# Patient Record
Sex: Female | Born: 1945
Health system: Southern US, Community
[De-identification: ages and names within clinical notes are randomized; demographics above are authoritative.]

## PROBLEM LIST (undated history)

## (undated) DIAGNOSIS — F32A Depression, unspecified: Secondary | ICD-10-CM

## (undated) DIAGNOSIS — I499 Cardiac arrhythmia, unspecified: Secondary | ICD-10-CM

## (undated) DIAGNOSIS — M109 Gout, unspecified: Secondary | ICD-10-CM

## (undated) DIAGNOSIS — F329 Major depressive disorder, single episode, unspecified: Secondary | ICD-10-CM

## (undated) DIAGNOSIS — G479 Sleep disorder, unspecified: Secondary | ICD-10-CM

## (undated) DIAGNOSIS — M170 Bilateral primary osteoarthritis of knee: Secondary | ICD-10-CM

## (undated) DIAGNOSIS — R519 Headache, unspecified: Secondary | ICD-10-CM

## (undated) DIAGNOSIS — G20 Parkinson's disease: Secondary | ICD-10-CM

## (undated) DIAGNOSIS — E785 Hyperlipidemia, unspecified: Secondary | ICD-10-CM

## (undated) DIAGNOSIS — M51369 Other intervertebral disc degeneration, lumbar region without mention of lumbar back pain or lower extremity pain: Secondary | ICD-10-CM

## (undated) DIAGNOSIS — R0600 Dyspnea, unspecified: Secondary | ICD-10-CM

## (undated) DIAGNOSIS — I5032 Chronic diastolic (congestive) heart failure: Secondary | ICD-10-CM

## (undated) DIAGNOSIS — F419 Anxiety disorder, unspecified: Secondary | ICD-10-CM

## (undated) DIAGNOSIS — E669 Obesity, unspecified: Secondary | ICD-10-CM

## (undated) DIAGNOSIS — H6090 Unspecified otitis externa, unspecified ear: Secondary | ICD-10-CM

## (undated) DIAGNOSIS — R51 Headache: Secondary | ICD-10-CM

## (undated) DIAGNOSIS — N6019 Diffuse cystic mastopathy of unspecified breast: Secondary | ICD-10-CM

## (undated) DIAGNOSIS — I4891 Unspecified atrial fibrillation: Secondary | ICD-10-CM

## (undated) DIAGNOSIS — G4733 Obstructive sleep apnea (adult) (pediatric): Secondary | ICD-10-CM

## (undated) DIAGNOSIS — E7800 Pure hypercholesterolemia, unspecified: Secondary | ICD-10-CM

## (undated) DIAGNOSIS — N898 Other specified noninflammatory disorders of vagina: Secondary | ICD-10-CM

## (undated) DIAGNOSIS — C50419 Malignant neoplasm of upper-outer quadrant of unspecified female breast: Secondary | ICD-10-CM

## (undated) DIAGNOSIS — M5030 Other cervical disc degeneration, unspecified cervical region: Secondary | ICD-10-CM

## (undated) DIAGNOSIS — I10 Essential (primary) hypertension: Secondary | ICD-10-CM

## (undated) DIAGNOSIS — M5136 Other intervertebral disc degeneration, lumbar region: Secondary | ICD-10-CM

## (undated) DIAGNOSIS — K219 Gastro-esophageal reflux disease without esophagitis: Secondary | ICD-10-CM

## (undated) DIAGNOSIS — Z923 Personal history of irradiation: Secondary | ICD-10-CM

## (undated) DIAGNOSIS — G20A1 Parkinson's disease without dyskinesia, without mention of fluctuations: Secondary | ICD-10-CM

## (undated) DIAGNOSIS — Z9989 Dependence on other enabling machines and devices: Secondary | ICD-10-CM

## (undated) HISTORY — DX: Obesity, unspecified: E66.9

## (undated) HISTORY — PX: UPPER GI ENDOSCOPY: SHX6162

## (undated) HISTORY — DX: Dependence on other enabling machines and devices: Z99.89

## (undated) HISTORY — DX: Obstructive sleep apnea (adult) (pediatric): G47.33

## (undated) HISTORY — DX: Malignant neoplasm of upper-outer quadrant of unspecified female breast: C50.419

## (undated) HISTORY — DX: Essential (primary) hypertension: I10

## (undated) HISTORY — DX: Diffuse cystic mastopathy of unspecified breast: N60.19

## (undated) HISTORY — DX: Hyperlipidemia, unspecified: E78.5

## (undated) HISTORY — DX: Chronic diastolic (congestive) heart failure: I50.32

## (undated) HISTORY — DX: Parkinson's disease without dyskinesia, without mention of fluctuations: G20.A1

## (undated) HISTORY — DX: Other cervical disc degeneration, unspecified cervical region: M50.30

## (undated) HISTORY — DX: Parkinson's disease: G20

## (undated) HISTORY — DX: Unspecified otitis externa, unspecified ear: H60.90

## (undated) HISTORY — DX: Other intervertebral disc degeneration, lumbar region without mention of lumbar back pain or lower extremity pain: M51.369

## (undated) HISTORY — DX: Bilateral primary osteoarthritis of knee: M17.0

## (undated) HISTORY — DX: Gout, unspecified: M10.9

## (undated) HISTORY — DX: Other intervertebral disc degeneration, lumbar region: M51.36

## (undated) HISTORY — DX: Other specified noninflammatory disorders of vagina: N89.8

---

## 1977-08-15 HISTORY — PX: TUBAL LIGATION: SHX77

## 1990-08-15 HISTORY — PX: ABDOMINAL HYSTERECTOMY: SHX81

## 1992-08-15 HISTORY — PX: CHOLECYSTECTOMY: SHX55

## 1993-08-15 HISTORY — PX: ERCP: SHX60

## 1998-07-31 ENCOUNTER — Other Ambulatory Visit: Admission: RE | Admit: 1998-07-31 | Discharge: 1998-07-31 | Payer: Self-pay | Admitting: Obstetrics and Gynecology

## 2005-07-01 ENCOUNTER — Ambulatory Visit: Payer: Self-pay | Admitting: Family Medicine

## 2006-06-01 ENCOUNTER — Ambulatory Visit: Payer: Self-pay | Admitting: Family Medicine

## 2007-06-18 ENCOUNTER — Ambulatory Visit: Payer: Self-pay | Admitting: General Surgery

## 2008-05-14 ENCOUNTER — Emergency Department: Payer: Self-pay | Admitting: Emergency Medicine

## 2008-06-20 ENCOUNTER — Ambulatory Visit: Payer: Self-pay | Admitting: General Surgery

## 2009-04-08 ENCOUNTER — Ambulatory Visit: Payer: Self-pay | Admitting: Gastroenterology

## 2009-07-06 ENCOUNTER — Ambulatory Visit: Payer: Self-pay | Admitting: Family Medicine

## 2010-07-07 ENCOUNTER — Ambulatory Visit: Payer: Self-pay | Admitting: Family Medicine

## 2010-07-15 ENCOUNTER — Ambulatory Visit: Payer: Self-pay | Admitting: Gastroenterology

## 2010-07-19 LAB — PATHOLOGY REPORT

## 2011-07-26 ENCOUNTER — Ambulatory Visit: Payer: Self-pay | Admitting: Family Medicine

## 2012-08-15 DIAGNOSIS — N6019 Diffuse cystic mastopathy of unspecified breast: Secondary | ICD-10-CM

## 2012-08-15 HISTORY — DX: Diffuse cystic mastopathy of unspecified breast: N60.19

## 2012-09-06 ENCOUNTER — Ambulatory Visit: Payer: Self-pay | Admitting: Family Medicine

## 2012-09-17 ENCOUNTER — Ambulatory Visit: Payer: Self-pay | Admitting: Family Medicine

## 2012-10-11 ENCOUNTER — Ambulatory Visit: Payer: Self-pay | Admitting: General Surgery

## 2012-10-11 DIAGNOSIS — I10 Essential (primary) hypertension: Secondary | ICD-10-CM

## 2012-10-13 DIAGNOSIS — C50419 Malignant neoplasm of upper-outer quadrant of unspecified female breast: Secondary | ICD-10-CM

## 2012-10-13 HISTORY — PX: BREAST SURGERY: SHX581

## 2012-10-13 HISTORY — DX: Malignant neoplasm of upper-outer quadrant of unspecified female breast: C50.419

## 2012-10-18 ENCOUNTER — Ambulatory Visit: Payer: Self-pay | Admitting: General Surgery

## 2012-10-30 ENCOUNTER — Telehealth: Payer: Self-pay | Admitting: *Deleted

## 2012-10-30 NOTE — Telephone Encounter (Signed)
Patient called the office today to let us know that she has contacted the [facility] and has made an appointment for evaluation. This patient has an appointment to see Dr. Carmina Miller on 11-12-12 at 8:30 am.

## 2012-11-01 LAB — PATHOLOGY REPORT

## 2012-11-02 ENCOUNTER — Encounter: Payer: Self-pay | Admitting: *Deleted

## 2012-11-05 ENCOUNTER — Encounter: Payer: Self-pay | Admitting: General Surgery

## 2012-11-05 ENCOUNTER — Ambulatory Visit (INDEPENDENT_AMBULATORY_CARE_PROVIDER_SITE_OTHER): Payer: BC Managed Care – PPO | Admitting: General Surgery

## 2012-11-05 VITALS — BP 144/70 | HR 82 | Resp 16 | Ht 64.0 in | Wt 266.0 lb

## 2012-11-05 DIAGNOSIS — D0510 Intraductal carcinoma in situ of unspecified breast: Secondary | ICD-10-CM

## 2012-11-05 DIAGNOSIS — I10 Essential (primary) hypertension: Secondary | ICD-10-CM | POA: Insufficient documentation

## 2012-11-05 DIAGNOSIS — Z86000 Personal history of in-situ neoplasm of breast: Secondary | ICD-10-CM | POA: Insufficient documentation

## 2012-11-05 DIAGNOSIS — K219 Gastro-esophageal reflux disease without esophagitis: Secondary | ICD-10-CM

## 2012-11-05 DIAGNOSIS — D0590 Unspecified type of carcinoma in situ of unspecified breast: Secondary | ICD-10-CM

## 2012-11-05 NOTE — Progress Notes (Signed)
This is a 67 year old patient following up from a right mastectomy done on 10/18/12. The patient is experiencing minimal discomfort.  Examination of the right breast wide excision site shows good healing without erythema or induration. Evidence of resolving ecchymosis is noted in the lower medial breast. The right axillary sentinel node site is healing well.  Ultrasound examination of the right breast shows a cavity lobe the wide excision site measuring 2.0 x 4.0 x 7.0 cm. This is located 1.5 cm below the level of the skin.  The pathology shows an in situ cancer. There will be no indication for adjuvant chemotherapy. The patient will be a candidate for antiestrogen therapy after completing radiation.  The patient is a candidate for either partial breast for breast radiation based on the anatomy post-reconstruction. This will be determined by the radiation oncologist.

## 2012-11-12 ENCOUNTER — Telehealth: Payer: Self-pay | Admitting: *Deleted

## 2012-11-12 ENCOUNTER — Ambulatory Visit: Payer: Self-pay | Admitting: Radiation Oncology

## 2012-11-12 NOTE — Telephone Encounter (Signed)
Per Toniann Fail at Dr. Kipp Laurence office, patient was seen today and is a candidate for mammosite placement. Please complete scheduling sheet on your desk so Mindi Junker can arrange. Thanks.

## 2012-11-13 ENCOUNTER — Other Ambulatory Visit: Payer: Self-pay | Admitting: *Deleted

## 2012-11-13 ENCOUNTER — Ambulatory Visit: Payer: Self-pay | Admitting: Radiation Oncology

## 2012-11-13 DIAGNOSIS — N63 Unspecified lump in unspecified breast: Secondary | ICD-10-CM

## 2012-11-13 MED ORDER — DOXYCYCLINE HYCLATE 100 MG PO TABS: 100.0000 mg | ORAL_TABLET | Freq: Two times a day (BID) | ORAL | Status: DC

## 2012-11-13 NOTE — Telephone Encounter (Signed)
Reviewed with pt mammosite details, aware of ATB

## 2012-11-13 NOTE — Telephone Encounter (Signed)
Inform pt of mammosite dates.  Place mammosite April 23, Scan 24 and treat April 25, 28-30 and May 1

## 2012-12-03 ENCOUNTER — Encounter: Payer: Self-pay | Admitting: *Deleted

## 2012-12-05 ENCOUNTER — Ambulatory Visit (INDEPENDENT_AMBULATORY_CARE_PROVIDER_SITE_OTHER): Payer: BC Managed Care – PPO | Admitting: General Surgery

## 2012-12-05 ENCOUNTER — Encounter: Payer: Self-pay | Admitting: General Surgery

## 2012-12-05 VITALS — HR 76 | Resp 16 | Ht 60.0 in | Wt 261.0 lb

## 2012-12-05 DIAGNOSIS — D0591 Unspecified type of carcinoma in situ of right breast: Secondary | ICD-10-CM

## 2012-12-05 DIAGNOSIS — D0590 Unspecified type of carcinoma in situ of unspecified breast: Secondary | ICD-10-CM

## 2012-12-05 NOTE — Progress Notes (Signed)
Patient ID: Selena Bush, female   DOB: 07/27/1946, 67 y.o.   MRN: [id number]  Chief Complaint  Patient presents with  . Other    mammosite    HPI Selena Bush is a 67 y.o. female.here today for her mammosite placement.HPI  Past Medical History  Diagnosis Date  . Hypertension   . Diffuse cystic mastopathy 2014  . Arthritis    a Past Surgical History  Procedure Laterality Date  . Colonoscopy  2011     Dr. Bluford Kaufmann  . Abdominal hysterectomy  1996  . Cholecystectomy  1998  . Ercp  1995  . Mastectomy Right 2014  . Breast surgery Right 2014    Family History  Problem Relation Age of Onset  . Colon cancer Maternal Grandfather   . Breast cancer Maternal Grandfather   . Colon cancer Mother     Social History History  Substance Use Topics  . Smoking status: Former Smoker -- 5.00 packs/day for 5 years  . Smokeless tobacco: Never Used  . Alcohol Use: No    Allergies  Allergen Reactions  . Codeine     Gi problems   . Penicillins     anaphylaxis    . Sulfa Antibiotics     anaphylaxis     Current Outpatient Prescriptions  Medication Sig Dispense Refill  . furosemide (LASIX) 40 MG tablet Take 40 mg by mouth daily.      [name] PENNSAID 1.5 % SOLN Take 150 mg by mouth 2 (two) times daily.      . potassium chloride (MICRO-K) 10 MEQ CR capsule Take 30 mEq by mouth 2 (two) times daily.      [name] amLODipine (NORVASC) 2.5 MG tablet       . atenolol (TENORMIN) 50 MG tablet Take 50 mg by mouth daily.      . busPIRone (BUSPAR) 15 MG tablet Take 15 mg by mouth as needed.      . doxycycline (VIBRA-TABS) 100 MG tablet Take 1 tablet (100 mg total) by mouth 2 (two) times daily.  20 tablet  0  . DULoxetine (CYMBALTA) 60 MG capsule Take 60 mg by mouth daily.      [name] lisinopril (PRINIVIL,ZESTRIL) 20 MG tablet Take 20 mg by mouth daily.      Alfonso Patten 2 MG TABS Take 2 mg by mouth at bedtime.      [name] NEXIUM 40 MG capsule Take 40 mg by mouth daily.      . traMADol (ULTRAM) 50 MG tablet Take 50 mg by  mouth as needed.      . triamterene-hydrochlorothiazide (MAXZIDE) 75-50 MG per tablet Take by mouth daily.       No current facility-administered medications for this visit.    Review of Systems Review of Systems  Constitutional: Negative.   Respiratory: Negative.   Cardiovascular: Negative.     Pulse 76, resp. rate 16, height 5' (1.524 m), weight 261 lb (118.389 kg).  Physical Exam Physical Exam Examination of the breast showed no contraindication of MammoSite balloon placement. Data Reviewed Ultrasound examination shows an adequate cavity depth 1.25 cm below the skin.    Assessment    DCIS of the right breast, candidate for partial breast radiation.     Plan    The patient had made use of Duricef this morning is requested. The area was prepped with alcohol and 10 cc of 0.5% Xylocaine with 0.25% Marcaine with one 200,000 units epinephrine was used for local anesthesia well tolerated. A 4-5 cm  spherical blue was then placed under ultrasound guidance and inflated with 35 cc of normal saline with Omnipaque. Except for transient discomfort which passed by the end of the procedure she was asymptomatic. Antibiotic ointment was placed at the balloon exit site followed by dry dressing.  The patient will have a CT scan at the cancer Center tomorrow followed by the initiation of partial breast radiation on April 25 under the care of Dr. Rushie Chestnut. Arrangements were made for follow up in 2-3 weeks for physician exam.       Earline Mayotte 12/05/2012, 8:07 PM

## 2012-12-05 NOTE — Patient Instructions (Addendum)
Patient care kit given for gauze and tape if needed. Follow up at [facility] as scheduled.

## 2012-12-07 ENCOUNTER — Encounter: Payer: Self-pay | Admitting: General Surgery

## 2012-12-07 DIAGNOSIS — D0591 Unspecified type of carcinoma in situ of right breast: Secondary | ICD-10-CM

## 2012-12-07 NOTE — Progress Notes (Signed)
Patient ID: Selena Bush, female   DOB: 11/03/1945, 67 y.o.   MRN: [id number]  Patient requested correction to medical record which has been completed.

## 2012-12-13 ENCOUNTER — Ambulatory Visit: Payer: Self-pay | Admitting: Radiation Oncology

## 2012-12-17 ENCOUNTER — Encounter: Payer: Self-pay | Admitting: General Surgery

## 2013-01-02 ENCOUNTER — Ambulatory Visit: Payer: BC Managed Care – PPO | Admitting: General Surgery

## 2013-01-08 ENCOUNTER — Ambulatory Visit: Payer: Self-pay | Admitting: Family Medicine

## 2013-01-10 ENCOUNTER — Ambulatory Visit: Payer: BC Managed Care – PPO | Admitting: General Surgery

## 2013-01-13 ENCOUNTER — Ambulatory Visit: Payer: Self-pay | Admitting: Radiation Oncology

## 2013-01-28 ENCOUNTER — Ambulatory Visit (INDEPENDENT_AMBULATORY_CARE_PROVIDER_SITE_OTHER): Payer: BC Managed Care – PPO | Admitting: General Surgery

## 2013-01-28 ENCOUNTER — Encounter: Payer: Self-pay | Admitting: General Surgery

## 2013-01-28 VITALS — BP 118/90 | HR 76 | Resp 16 | Wt 251.0 lb

## 2013-01-28 DIAGNOSIS — D0590 Unspecified type of carcinoma in situ of unspecified breast: Secondary | ICD-10-CM

## 2013-01-28 DIAGNOSIS — D0591 Unspecified type of carcinoma in situ of right breast: Secondary | ICD-10-CM

## 2013-01-28 MED ORDER — TAMOXIFEN CITRATE 20 MG PO TABS: 20.0000 mg | ORAL_TABLET | Freq: Every day | ORAL | Status: DC

## 2013-01-28 NOTE — Patient Instructions (Addendum)
Patient to take a baby aspirin daily. Marland Kitchenasa

## 2013-01-28 NOTE — Progress Notes (Signed)
Patient ID: Selena Bush, female   DOB: [birth year], 67 y.o.   MRN: [id number]  Chief Complaint  Patient presents with  . Other    mammosite    HPI Selena Bush is a 67 y.o. female here today following up from her mamaosite placement done 12/05/12. HPI  Past Medical History  Diagnosis Date  . Hypertension   . Diffuse cystic mastopathy 2014  . Arthritis     Past Surgical History  Procedure Laterality Date  . Colonoscopy  2011     Dr. Bluford Kaufmann  . Abdominal hysterectomy  1996  . Cholecystectomy  1998  . Ercp  1995  . Mastectomy Right 2014  . Breast surgery Right 2014    Family History  Problem Relation Age of Onset  . Colon cancer Maternal Grandfather   . Breast cancer Maternal Grandfather   . Colon cancer Mother     Social History History  Substance Use Topics  . Smoking status: Former Smoker -- 5.00 packs/day for 5 years  . Smokeless tobacco: Never Used  . Alcohol Use: No    Allergies  Allergen Reactions  . Codeine     Gi problems   . Penicillins     anaphylaxis    . Sulfa Antibiotics     anaphylaxis     Current Outpatient Prescriptions  Medication Sig Dispense Refill  . amLODipine (NORVASC) 2.5 MG tablet       . atenolol (TENORMIN) 50 MG tablet Take 50 mg by mouth daily.      . busPIRone (BUSPAR) 15 MG tablet Take 15 mg by mouth as needed.      . Diclofenac Sodium POWD       . doxycycline (VIBRA-TABS) 100 MG tablet Take 1 tablet (100 mg total) by mouth 2 (two) times daily.  20 tablet  0  . DULoxetine (CYMBALTA) 60 MG capsule Take 60 mg by mouth daily.      . fluticasone (FLONASE) 50 MCG/ACT nasal spray       . furosemide (LASIX) 40 MG tablet Take 40 mg by mouth daily.      [name] HYDROcodone-acetaminophen (NORCO/VICODIN) 5-325 MG per tablet       . lisinopril (PRINIVIL,ZESTRIL) 20 MG tablet Take 20 mg by mouth daily.      Alfonso Patten 2 MG TABS Take 2 mg by mouth at bedtime.      . meloxicam (MOBIC) 7.5 MG tablet       . NEXIUM 40 MG capsule Take 40 mg by mouth  daily.      [name] PENNSAID 1.5 % SOLN Take 150 mg by mouth 2 (two) times daily.      . potassium chloride (MICRO-K) 10 MEQ CR capsule Take 30 mEq by mouth 2 (two) times daily.      . propranolol ER (INDERAL LA) 120 MG 24 hr capsule       . tiZANidine (ZANAFLEX) 4 MG tablet       . traMADol (ULTRAM) 50 MG tablet Take 50 mg by mouth as needed.      . triamterene-hydrochlorothiazide (MAXZIDE) 75-50 MG per tablet Take by mouth daily.      . tamoxifen (NOLVADEX) 20 MG tablet Take 1 tablet (20 mg total) by mouth daily.  30 tablet  11   No current facility-administered medications for this visit.    Review of Systems Review of Systems  Constitutional: Negative.   Respiratory: Negative.   Cardiovascular: Negative.     Blood pressure 118/90, pulse 76, resp. rate  16, weight 251 lb (113.853 kg).  Physical Exam Physical Exam Examination of the right breast status post MammoSite balloon removal shows good healing of the exit site. No evidence of loculated fluid. No erythema or induration. Data Reviewed No new data.  Assessment    DCIS right breast.     Plan    The patient is a candidate for antiestrogen therapy based on her ER-positive status. Tamoxifen may not be as effective because of her use of Cymbalta.  Her primary physician will be contacted regarding a possible change to a less potent cytochrome P 450 stimulator. In the interval, we'll place her on tamoxifen 20 mg daily. She reported her progress in one month. Followup exam and right mammogram in 6 months.        Earline Mayotte 01/29/2013, 8:18 PM

## 2013-01-29 ENCOUNTER — Encounter: Payer: Self-pay | Admitting: General Surgery

## 2013-02-21 ENCOUNTER — Telehealth: Payer: Self-pay

## 2013-02-21 NOTE — Telephone Encounter (Signed)
This encounter was created in error - please disregard.

## 2013-02-21 NOTE — Telephone Encounter (Signed)
Patient called to state that she is doing very well after being on Tamoxifen for the past 30 days. She states that she is unable to take the half tab of entericoted 81 mg aspirin due to stomach burning. She has had no side effects from theTamoxifen and just wanted [place] to know.

## 2013-04-05 ENCOUNTER — Encounter (HOSPITAL_COMMUNITY): Payer: Self-pay | Admitting: Pharmacy Technician

## 2013-04-08 ENCOUNTER — Ambulatory Visit (HOSPITAL_COMMUNITY)
Admission: RE | Admit: 2013-04-08 | Discharge: 2013-04-08 | Disposition: A | Discharge status: Home / Self Care | Payer: BC Managed Care – PPO | Source: Ambulatory Visit | Attending: Orthopedic Surgery | Admitting: Orthopedic Surgery

## 2013-04-08 ENCOUNTER — Encounter (HOSPITAL_COMMUNITY)
Admission: RE | Admit: 2013-04-08 | Discharge: 2013-04-08 | Disposition: A | Discharge status: Home / Self Care | Payer: BC Managed Care – PPO | Source: Ambulatory Visit | Attending: Orthopedic Surgery | Admitting: Orthopedic Surgery

## 2013-04-08 ENCOUNTER — Encounter (HOSPITAL_COMMUNITY): Payer: Self-pay

## 2013-04-08 DIAGNOSIS — Z01818 Encounter for other preprocedural examination: Secondary | ICD-10-CM | POA: Insufficient documentation

## 2013-04-08 DIAGNOSIS — M1710 Unilateral primary osteoarthritis, unspecified knee: Secondary | ICD-10-CM | POA: Insufficient documentation

## 2013-04-08 DIAGNOSIS — Z01812 Encounter for preprocedural laboratory examination: Secondary | ICD-10-CM | POA: Insufficient documentation

## 2013-04-08 HISTORY — DX: Pure hypercholesterolemia, unspecified: E78.00

## 2013-04-08 HISTORY — DX: Sleep disorder, unspecified: G47.9

## 2013-04-08 HISTORY — DX: Anxiety disorder, unspecified: F41.9

## 2013-04-08 HISTORY — DX: Depression, unspecified: F32.A

## 2013-04-08 HISTORY — DX: Gastro-esophageal reflux disease without esophagitis: K21.9

## 2013-04-08 HISTORY — DX: Major depressive disorder, single episode, unspecified: F32.9

## 2013-04-08 LAB — CBC
HCT: 36.4 % (ref 36.0–46.0)
Hemoglobin: 11.4 g/dL — ABNORMAL LOW (ref 12.0–15.0)
MCH: 24.9 pg — ABNORMAL LOW (ref 26.0–34.0)
MCV: 79.5 fL (ref 78.0–100.0)
RBC: 4.58 MIL/uL (ref 3.87–5.11)
WBC: 7.8 10*3/uL (ref 4.0–10.5)

## 2013-04-08 LAB — URINALYSIS, ROUTINE W REFLEX MICROSCOPIC
Hgb urine dipstick: NEGATIVE
Leukocytes, UA: NEGATIVE
Nitrite: NEGATIVE
Protein, ur: NEGATIVE mg/dL
Urobilinogen, UA: 0.2 mg/dL (ref 0.0–1.0)

## 2013-04-08 LAB — ABO/RH: ABO/RH(D): AB POS

## 2013-04-08 LAB — BASIC METABOLIC PANEL
CO2: 29 mEq/L (ref 19–32)
Chloride: 98 mEq/L (ref 96–112)
Glucose, Bld: 101 mg/dL — ABNORMAL HIGH (ref 70–99)
Sodium: 136 mEq/L (ref 135–145)

## 2013-04-08 LAB — APTT: aPTT: 29 seconds (ref 24–37)

## 2013-04-08 NOTE — Pre-Procedure Instructions (Signed)
EKG AND OFFICE NOTE AND MEDICAL CLEARANCE FOR KNEE SURGERY ON CHART FROM DR. MALONEY- [facility]. CXR WAS DONE TODAY PREOP - AT [facility].

## 2013-04-08 NOTE — Patient Instructions (Signed)
YOUR SURGERY IS SCHEDULED AT [facility]  [id number]  9/2  REPORT TO [facility] AT:  9:10 AM      PHONE # FOR SHORT STAY IS [phone]  DO NOT EAT OR DRINK ANYTHING AFTER MIDNIGHT THE NIGHT BEFORE YOUR SURGERY.  YOU MAY BRUSH YOUR TEETH, RINSE OUT YOUR MOUTH--BUT NO WATER, NO FOOD, NO CHEWING GUM, NO MINTS, NO CANDIES, NO CHEWING TOBACCO.  PLEASE TAKE THE FOLLOWING MEDICATIONS THE AM OF YOUR SURGERY WITH A FEW SIPS OF WATER:  ATENOLOL, DULOXETINE, NEXIUM    IF YOU HAVE SLEEP APNEA AND USE CPAP OR BIPAP--PLEASE BRING THE MASK AND THE TUBING.  DO NOT BRING YOUR MACHINE.  DO NOT BRING VALUABLES, MONEY, CREDIT CARDS.  DO NOT WEAR JEWELRY, MAKE-UP, NAIL POLISH AND NO METAL PINS OR CLIPS IN YOUR HAIR. CONTACT LENS, DENTURES / PARTIALS, GLASSES SHOULD NOT BE WORN TO SURGERY AND IN MOST CASES-HEARING AIDS WILL NEED TO BE REMOVED.  BRING YOUR GLASSES CASE, ANY EQUIPMENT NEEDED FOR YOUR CONTACT LENS. FOR PATIENTS ADMITTED TO THE HOSPITAL--CHECK OUT TIME THE DAY OF DISCHARGE IS 11:00 AM.  ALL INPATIENT ROOMS ARE PRIVATE - WITH BATHROOM, TELEPHONE, TELEVISION AND WIFI INTERNET.                               PLEASE READ OVER ANY  FACT SHEETS THAT YOU WERE GIVEN: MRSA INFORMATION, BLOOD TRANSFUSION INFORMATION, INCENTIVE SPIROMETER INFORMATION. FAILURE TO FOLLOW THESE INSTRUCTIONS MAY RESULT IN THE CANCELLATION OF YOUR SURGERY.   PATIENT SIGNATURE_________________________________

## 2013-04-10 NOTE — H&P (Signed)
TOTAL KNEE ADMISSION H&P  Patient is being admitted for right total knee arthroplasty.  Subjective:  Chief Complaint:   Right knee OA / pain.  HPI: Selena Bush, 67 y.o. female, has a history of pain and functional disability in the right knee due to arthritis and has failed non-surgical conservative treatments for greater than 12 weeks to includecorticosteriod injections, viscosupplementation injections, use of assistive devices and activity modification.  Onset of symptoms was gradual, starting 5 years ago with rapidlly worsening course since that time. The patient noted no past surgery on the right knee(s).  Patient currently rates pain in the right knee(s) at 8 out of 10 with activity. Patient has night pain, worsening of pain with activity and weight bearing, pain that interferes with activities of daily living, pain with passive range of motion, crepitus and joint swelling.  Patient has evidence of periarticular osteophytes and joint space narrowing by imaging studies.  There is no active signs of infection.  Risks, benefits and expectations were discussed with the patient. Patient understand the risks, benefits and expectations and wishes to proceed with surgery.   D/C Plans:   Home with HHPT  Post-op Meds:    No Rx given  Tranexamic Acid:   To be given  Decadron:    To be given  FYI:    Xarelto post-op  Norco post-op  CPAP will need to be ordered   Patient Active Problem List   Diagnosis Date Noted  . DCIS (ductal carcinoma in situ) 11/05/2012  . Essential hypertension, benign 11/05/2012   Past Medical History  Diagnosis Date  . Hypertension   . Diffuse cystic mastopathy 2014  . Anxiety   . Depression   . Pain     LUMBAR PAIN -DDD  . GERD (gastroesophageal reflux disease)   . Mitral insufficiency   . Arthritis     OA BOTH KNEES  . Hypercholesterolemia   . Sleep difficulties     LUNESTA HAS HELPED  . Cancer 10/2012    PARTIAL RIGHT MASTECTOMY FOR BREAST CANCER--HAD  RADIATION - NO CHEMO --DR. CRYSTAL [place] ONCOLOGIST  . Sleep apnea     USES CPAP    Past Surgical History  Procedure Laterality Date  . Colonoscopy  2011     Dr. Bluford Kaufmann  . Ercp  1995  . Breast surgery Right 2014  . Mastectomy Right 10/19/2012    PARTIAL MASTECTOMY  . Cholecystectomy  1994  . Tubal ligation  1979  . Abdominal hysterectomy  1992    No prescriptions prior to admission   Allergies  Allergen Reactions  . Codeine     Gi problems   . Penicillins     anaphylaxis    . Statins Other (See Comments)    Leg pains  . Sulfa Antibiotics     anaphylaxis     History  Substance Use Topics  . Smoking status: Former Smoker -- 5.00 packs/day for 5 years    Types: Cigarettes  . Smokeless tobacco: Never Used  . Alcohol Use: No     Comment: ONLY SMOKED AS A TEENAGER    Family History  Problem Relation Age of Onset  . Colon cancer Maternal Grandfather   . Breast cancer Maternal Grandfather   . Colon cancer Mother      Review of Systems  Constitutional: Negative.   HENT: Positive for tinnitus.   Eyes: Negative.   Respiratory: Negative.   Cardiovascular: Negative.   Gastrointestinal: Positive for diarrhea.  Genitourinary: Positive for frequency.  Musculoskeletal: Positive for myalgias, back pain and joint pain.  Skin: Negative.   Neurological: Negative.   Endo/Heme/Allergies: Positive for environmental allergies.  Psychiatric/Behavioral: Negative.     Objective:  Physical Exam  Constitutional: She is oriented to person, place, and time. She appears well-developed and well-nourished.  HENT:  Head: Normocephalic and atraumatic.  Mouth/Throat: Oropharynx is clear and moist.  Eyes: Pupils are equal, round, and reactive to light.  Neck: Neck supple. No JVD present. No tracheal deviation present. No thyromegaly present.  Cardiovascular: Normal rate, regular rhythm, normal heart sounds and intact distal pulses.   Respiratory: Effort normal and breath sounds normal.  No stridor. No respiratory distress. She has no wheezes.  GI: Soft. There is no tenderness. There is no guarding.  Musculoskeletal:       Right knee: She exhibits decreased range of motion, swelling and bony tenderness. She exhibits no effusion, no ecchymosis, no deformity, no laceration and no erythema. Tenderness found.  Lymphadenopathy:    She has no cervical adenopathy.  Neurological: She is alert and oriented to person, place, and time.  Skin: Skin is warm and dry.  Psychiatric: She has a normal mood and affect.    Labs:  Estimated body mass index is 49.02 kg/(m^2) as calculated from the following:   Height as of 12/05/12: 5' (1.524 m).   Weight as of 01/28/13: 113.853 kg (251 lb).  Imaging Review Plain radiographs demonstrate severe degenerative joint disease of the right knee(s). The overall alignment is neutral. The bone quality appears to be good for age and reported activity level.  Assessment/Plan:  End stage arthritis, right knee   The patient history, physical examination, clinical judgment of the provider and imaging studies are consistent with end stage degenerative joint disease of the right knee(s) and total knee arthroplasty is deemed medically necessary. The treatment options including medical management, injection therapy arthroscopy and arthroplasty were discussed at length. The risks and benefits of total knee arthroplasty were presented and reviewed. The risks due to aseptic loosening, infection, stiffness, patella tracking problems, thromboembolic complications and other imponderables were discussed. The patient acknowledged the explanation, agreed to proceed with the plan and consent was signed. Patient is being admitted for inpatient treatment for surgery, pain control, PT, OT, prophylactic antibiotics, VTE prophylaxis, progressive ambulation and ADL's and discharge planning. The patient is planning to be discharged home with home health services.   Anastasio Auerbach  Indiyah Paone   PAC  04/10/2013, 5:26 PM

## 2013-04-15 HISTORY — PX: JOINT REPLACEMENT: SHX530

## 2013-04-16 ENCOUNTER — Encounter (HOSPITAL_COMMUNITY): Payer: Self-pay | Admitting: *Deleted

## 2013-04-16 ENCOUNTER — Inpatient Hospital Stay (HOSPITAL_COMMUNITY): Payer: BC Managed Care – PPO | Admitting: Anesthesiology

## 2013-04-16 ENCOUNTER — Inpatient Hospital Stay (HOSPITAL_COMMUNITY)
Admission: RE | Admit: 2013-04-16 | Discharge: 2013-04-19 | DRG: 209 | Disposition: A | Discharge status: Home Health | Payer: BC Managed Care – PPO | Source: Ambulatory Visit | Attending: Orthopedic Surgery | Admitting: Orthopedic Surgery

## 2013-04-16 ENCOUNTER — Encounter (HOSPITAL_COMMUNITY)
Admission: RE | Disposition: A | Discharge status: Home Health | Payer: Self-pay | Source: Ambulatory Visit | Attending: Orthopedic Surgery

## 2013-04-16 ENCOUNTER — Encounter (HOSPITAL_COMMUNITY): Payer: Self-pay | Admitting: Anesthesiology

## 2013-04-16 DIAGNOSIS — D62 Acute posthemorrhagic anemia: Secondary | ICD-10-CM | POA: Diagnosis not present

## 2013-04-16 DIAGNOSIS — E662 Morbid (severe) obesity with alveolar hypoventilation: Secondary | ICD-10-CM | POA: Diagnosis present

## 2013-04-16 DIAGNOSIS — Z96659 Presence of unspecified artificial knee joint: Secondary | ICD-10-CM

## 2013-04-16 DIAGNOSIS — D500 Iron deficiency anemia secondary to blood loss (chronic): Secondary | ICD-10-CM | POA: Diagnosis not present

## 2013-04-16 DIAGNOSIS — Z6841 Body Mass Index (BMI) 40.0 and over, adult: Secondary | ICD-10-CM

## 2013-04-16 DIAGNOSIS — Z96651 Presence of right artificial knee joint: Secondary | ICD-10-CM

## 2013-04-16 DIAGNOSIS — E7800 Pure hypercholesterolemia, unspecified: Secondary | ICD-10-CM | POA: Diagnosis present

## 2013-04-16 DIAGNOSIS — M1710 Unilateral primary osteoarthritis, unspecified knee: Principal | ICD-10-CM | POA: Diagnosis present

## 2013-04-16 DIAGNOSIS — F411 Generalized anxiety disorder: Secondary | ICD-10-CM | POA: Diagnosis present

## 2013-04-16 DIAGNOSIS — E871 Hypo-osmolality and hyponatremia: Secondary | ICD-10-CM | POA: Diagnosis not present

## 2013-04-16 HISTORY — PX: TOTAL KNEE ARTHROPLASTY: SHX125

## 2013-04-16 LAB — TYPE AND SCREEN
ABO/RH(D): AB POS
Antibody Screen: NEGATIVE

## 2013-04-16 SURGERY — ARTHROPLASTY, KNEE, TOTAL
Anesthesia: Spinal | Site: Knee | Laterality: Right | Wound class: Clean

## 2013-04-16 MED ORDER — KETOROLAC TROMETHAMINE 30 MG/ML IJ SOLN
INTRAMUSCULAR | Status: DC | PRN
  Administered 2013-04-16: 30 mg

## 2013-04-16 MED ORDER — PHENOL 1.4 % MT LIQD
1.0000 | OROMUCOSAL | Status: DC | PRN
  Filled 2013-04-16: qty 177

## 2013-04-16 MED ORDER — PROMETHAZINE HCL 25 MG/ML IJ SOLN: 6.2500 mg | INTRAMUSCULAR | Status: DC | PRN

## 2013-04-16 MED ORDER — KETOROLAC TROMETHAMINE 30 MG/ML IJ SOLN
INTRAMUSCULAR | Status: AC
  Filled 2013-04-16: qty 1

## 2013-04-16 MED ORDER — METHOCARBAMOL 500 MG PO TABS
500.0000 mg | ORAL_TABLET | Freq: Four times a day (QID) | ORAL | Status: DC | PRN
  Administered 2013-04-17 – 2013-04-19 (×9): 500 mg via ORAL
  Filled 2013-04-16 (×11): qty 1

## 2013-04-16 MED ORDER — DOCUSATE SODIUM 100 MG PO CAPS
100.0000 mg | ORAL_CAPSULE | Freq: Two times a day (BID) | ORAL | Status: DC
  Administered 2013-04-16 – 2013-04-19 (×6): 100 mg via ORAL

## 2013-04-16 MED ORDER — ACETAMINOPHEN 500 MG PO TABS
ORAL_TABLET | ORAL | Status: AC
  Filled 2013-04-16: qty 2

## 2013-04-16 MED ORDER — MEPERIDINE HCL 50 MG/ML IJ SOLN: 6.2500 mg | INTRAMUSCULAR | Status: DC | PRN

## 2013-04-16 MED ORDER — CLINDAMYCIN PHOSPHATE 900 MG/50ML IV SOLN
900.0000 mg | INTRAVENOUS | Status: AC
  Administered 2013-04-16: 900 mg via INTRAVENOUS

## 2013-04-16 MED ORDER — PROPOFOL 10 MG/ML IV BOLUS
INTRAVENOUS | Status: DC | PRN
  Administered 2013-04-16: 50 mg via INTRAVENOUS

## 2013-04-16 MED ORDER — SODIUM CHLORIDE 0.9 % IV SOLN
1000.0000 mg | Freq: Once | INTRAVENOUS | Status: AC
  Administered 2013-04-16: 1000 mg via INTRAVENOUS
  Filled 2013-04-16: qty 10

## 2013-04-16 MED ORDER — ONDANSETRON HCL 4 MG/2ML IJ SOLN: 4.0000 mg | Freq: Four times a day (QID) | INTRAMUSCULAR | Status: DC | PRN

## 2013-04-16 MED ORDER — FENTANYL CITRATE 0.05 MG/ML IJ SOLN
INTRAMUSCULAR | Status: DC | PRN
  Administered 2013-04-16 (×2): 50 ug via INTRAVENOUS
  Administered 2013-04-16: 100 ug via INTRAVENOUS
  Administered 2013-04-16 (×2): 50 ug via INTRAVENOUS

## 2013-04-16 MED ORDER — FLUTICASONE PROPIONATE 50 MCG/ACT NA SUSP
1.0000 | Freq: Every day | NASAL | Status: DC | PRN
  Filled 2013-04-16: qty 16

## 2013-04-16 MED ORDER — HYDRALAZINE HCL 20 MG/ML IJ SOLN
5.0000 mg | INTRAMUSCULAR | Status: DC | PRN
  Administered 2013-04-16: 5 mg via INTRAVENOUS

## 2013-04-16 MED ORDER — SODIUM CHLORIDE 0.9 % IV SOLN
INTRAVENOUS | Status: DC
  Administered 2013-04-16: 21:00:00 via INTRAVENOUS
  Filled 2013-04-16 (×13): qty 1000

## 2013-04-16 MED ORDER — MIDAZOLAM HCL 5 MG/5ML IJ SOLN
INTRAMUSCULAR | Status: DC | PRN
  Administered 2013-04-16: 2 mg via INTRAVENOUS

## 2013-04-16 MED ORDER — CLINDAMYCIN PHOSPHATE 600 MG/50ML IV SOLN
600.0000 mg | Freq: Four times a day (QID) | INTRAVENOUS | Status: AC
  Administered 2013-04-16 – 2013-04-17 (×2): 600 mg via INTRAVENOUS
  Filled 2013-04-16 (×2): qty 50

## 2013-04-16 MED ORDER — KETOROLAC TROMETHAMINE 15 MG/ML IJ SOLN
15.0000 mg | Freq: Four times a day (QID) | INTRAMUSCULAR | Status: AC
  Administered 2013-04-16 – 2013-04-17 (×4): 15 mg via INTRAVENOUS
  Filled 2013-04-16 (×5): qty 1

## 2013-04-16 MED ORDER — FUROSEMIDE 40 MG PO TABS
40.0000 mg | ORAL_TABLET | Freq: Two times a day (BID) | ORAL | Status: DC
  Administered 2013-04-16 – 2013-04-19 (×6): 40 mg via ORAL
  Filled 2013-04-16 (×9): qty 1

## 2013-04-16 MED ORDER — METOCLOPRAMIDE HCL 5 MG/ML IJ SOLN: 5.0000 mg | Freq: Three times a day (TID) | INTRAMUSCULAR | Status: DC | PRN

## 2013-04-16 MED ORDER — BUSPIRONE HCL 15 MG PO TABS
30.0000 mg | ORAL_TABLET | Freq: Every day | ORAL | Status: DC
  Administered 2013-04-16 – 2013-04-18 (×3): 30 mg via ORAL
  Filled 2013-04-16 (×4): qty 2

## 2013-04-16 MED ORDER — ALUM & MAG HYDROXIDE-SIMETH 200-200-20 MG/5ML PO SUSP: 30.0000 mL | ORAL | Status: DC | PRN

## 2013-04-16 MED ORDER — HYDROMORPHONE HCL PF 1 MG/ML IJ SOLN
INTRAMUSCULAR | Status: AC
  Filled 2013-04-16: qty 1

## 2013-04-16 MED ORDER — RIVAROXABAN 10 MG PO TABS
10.0000 mg | ORAL_TABLET | ORAL | Status: DC
  Administered 2013-04-17 – 2013-04-19 (×3): 10 mg via ORAL
  Filled 2013-04-16 (×4): qty 1

## 2013-04-16 MED ORDER — DULOXETINE HCL 60 MG PO CPEP
60.0000 mg | ORAL_CAPSULE | Freq: Every morning | ORAL | Status: DC
  Administered 2013-04-17 – 2013-04-19 (×3): 60 mg via ORAL
  Filled 2013-04-16 (×3): qty 1

## 2013-04-16 MED ORDER — ONDANSETRON HCL 4 MG PO TABS
4.0000 mg | ORAL_TABLET | Freq: Four times a day (QID) | ORAL | Status: DC | PRN
  Administered 2013-04-16 – 2013-04-19 (×2): 4 mg via ORAL
  Filled 2013-04-16 (×2): qty 1

## 2013-04-16 MED ORDER — HYDROCODONE-ACETAMINOPHEN 7.5-325 MG PO TABS
1.0000 | ORAL_TABLET | ORAL | Status: DC
  Administered 2013-04-16: 1 via ORAL
  Administered 2013-04-16: 2 via ORAL
  Administered 2013-04-16: 1 via ORAL
  Administered 2013-04-17 (×2): 2 via ORAL
  Filled 2013-04-16: qty 2
  Filled 2013-04-16: qty 1
  Filled 2013-04-16: qty 2
  Filled 2013-04-16: qty 1
  Filled 2013-04-16: qty 2

## 2013-04-16 MED ORDER — SODIUM CHLORIDE 0.9 % IJ SOLN
INTRAMUSCULAR | Status: DC | PRN
  Administered 2013-04-16: 13:00:00

## 2013-04-16 MED ORDER — 0.9 % SODIUM CHLORIDE (POUR BTL) OPTIME
TOPICAL | Status: DC | PRN
  Administered 2013-04-16: 1000 mL

## 2013-04-16 MED ORDER — BUPIVACAINE LIPOSOME 1.3 % IJ SUSP
20.0000 mL | Freq: Once | INTRAMUSCULAR | Status: DC
  Filled 2013-04-16: qty 20

## 2013-04-16 MED ORDER — ACETAMINOPHEN 500 MG PO TABS
1000.0000 mg | ORAL_TABLET | Freq: Once | ORAL | Status: AC
  Administered 2013-04-16: 1000 mg via ORAL

## 2013-04-16 MED ORDER — CHLORHEXIDINE GLUCONATE 4 % EX LIQD
60.0000 mL | Freq: Once | CUTANEOUS | Status: DC
  Filled 2013-04-16: qty 60

## 2013-04-16 MED ORDER — BISACODYL 10 MG RE SUPP: 10.0000 mg | Freq: Every day | RECTAL | Status: DC | PRN

## 2013-04-16 MED ORDER — BUPIVACAINE-EPINEPHRINE 0.25% -1:200000 IJ SOLN
INTRAMUSCULAR | Status: AC
  Filled 2013-04-16: qty 1

## 2013-04-16 MED ORDER — DEXAMETHASONE SODIUM PHOSPHATE 10 MG/ML IJ SOLN: 10.0000 mg | Freq: Once | INTRAMUSCULAR | Status: DC

## 2013-04-16 MED ORDER — POTASSIUM CHLORIDE CRYS ER 10 MEQ PO TBCR
10.0000 meq | EXTENDED_RELEASE_TABLET | Freq: Two times a day (BID) | ORAL | Status: DC
  Administered 2013-04-16 – 2013-04-19 (×6): 10 meq via ORAL
  Filled 2013-04-16 (×8): qty 1

## 2013-04-16 MED ORDER — HYDROMORPHONE HCL PF 1 MG/ML IJ SOLN
0.5000 mg | INTRAMUSCULAR | Status: DC | PRN
  Administered 2013-04-16 – 2013-04-17 (×3): 1 mg via INTRAVENOUS
  Filled 2013-04-16 (×4): qty 1

## 2013-04-16 MED ORDER — POLYETHYLENE GLYCOL 3350 17 G PO PACK
17.0000 g | PACK | Freq: Two times a day (BID) | ORAL | Status: DC
Start: 2013-04-16 — End: 2013-04-19
  Administered 2013-04-16 – 2013-04-18 (×5): 17 g via ORAL

## 2013-04-16 MED ORDER — TAMOXIFEN CITRATE 10 MG PO TABS
20.0000 mg | ORAL_TABLET | Freq: Every evening | ORAL | Status: DC
  Administered 2013-04-16 – 2013-04-18 (×3): 20 mg via ORAL
  Filled 2013-04-16 (×4): qty 2

## 2013-04-16 MED ORDER — HYDROMORPHONE HCL PF 1 MG/ML IJ SOLN
0.2500 mg | INTRAMUSCULAR | Status: DC | PRN
  Administered 2013-04-16: 0.5 mg via INTRAVENOUS

## 2013-04-16 MED ORDER — DEXAMETHASONE SODIUM PHOSPHATE 10 MG/ML IJ SOLN
10.0000 mg | Freq: Once | INTRAMUSCULAR | Status: AC
  Administered 2013-04-17: 10 mg via INTRAVENOUS
  Filled 2013-04-16: qty 1

## 2013-04-16 MED ORDER — HYDRALAZINE HCL 20 MG/ML IJ SOLN
INTRAMUSCULAR | Status: AC
  Filled 2013-04-16: qty 1

## 2013-04-16 MED ORDER — LACTATED RINGERS IV SOLN
INTRAVENOUS | Status: DC
  Administered 2013-04-16: 1000 mL via INTRAVENOUS
  Administered 2013-04-16 (×2): via INTRAVENOUS

## 2013-04-16 MED ORDER — HYDROMORPHONE HCL PF 1 MG/ML IJ SOLN
INTRAMUSCULAR | Status: DC | PRN
  Administered 2013-04-16: .5 mg via INTRAVENOUS
  Administered 2013-04-16: 0.5 mg via INTRAVENOUS
  Administered 2013-04-16: 1 mg via INTRAVENOUS

## 2013-04-16 MED ORDER — FLEET ENEMA 7-19 GM/118ML RE ENEM: 1.0000 | ENEMA | Freq: Once | RECTAL | Status: AC | PRN

## 2013-04-16 MED ORDER — FERROUS SULFATE 325 (65 FE) MG PO TABS
325.0000 mg | ORAL_TABLET | Freq: Three times a day (TID) | ORAL | Status: DC
  Administered 2013-04-16 – 2013-04-19 (×7): 325 mg via ORAL
  Filled 2013-04-16 (×11): qty 1

## 2013-04-16 MED ORDER — SODIUM CHLORIDE 0.9 % IR SOLN
Status: DC | PRN
  Administered 2013-04-16: 1000 mL

## 2013-04-16 MED ORDER — MENTHOL 3 MG MT LOZG
1.0000 | LOZENGE | OROMUCOSAL | Status: DC | PRN
  Filled 2013-04-16: qty 9

## 2013-04-16 MED ORDER — ATENOLOL 50 MG PO TABS
50.0000 mg | ORAL_TABLET | Freq: Every morning | ORAL | Status: DC
  Administered 2013-04-17 – 2013-04-19 (×3): 50 mg via ORAL
  Filled 2013-04-16 (×3): qty 1

## 2013-04-16 MED ORDER — PROPOFOL INFUSION 10 MG/ML OPTIME
INTRAVENOUS | Status: DC | PRN
  Administered 2013-04-16: 100 ug/kg/min via INTRAVENOUS

## 2013-04-16 MED ORDER — METOCLOPRAMIDE HCL 10 MG PO TABS: 5.0000 mg | ORAL_TABLET | Freq: Three times a day (TID) | ORAL | Status: DC | PRN

## 2013-04-16 MED ORDER — LABETALOL HCL 5 MG/ML IV SOLN
INTRAVENOUS | Status: DC | PRN
  Administered 2013-04-16 (×2): 5 mg via INTRAVENOUS

## 2013-04-16 MED ORDER — DEXTROSE 5 % IV SOLN
500.0000 mg | Freq: Four times a day (QID) | INTRAVENOUS | Status: DC | PRN
  Administered 2013-04-16: 500 mg via INTRAVENOUS
  Filled 2013-04-16 (×2): qty 5

## 2013-04-16 MED ORDER — DIPHENHYDRAMINE HCL 25 MG PO CAPS: 25.0000 mg | ORAL_CAPSULE | Freq: Four times a day (QID) | ORAL | Status: DC | PRN

## 2013-04-16 MED ORDER — ZOLPIDEM TARTRATE 5 MG PO TABS
5.0000 mg | ORAL_TABLET | Freq: Every evening | ORAL | Status: DC | PRN
  Administered 2013-04-16 – 2013-04-17 (×2): 5 mg via ORAL
  Filled 2013-04-16 (×2): qty 1

## 2013-04-16 MED ORDER — BUPIVACAINE-EPINEPHRINE 0.25% -1:200000 IJ SOLN
INTRAMUSCULAR | Status: DC | PRN
  Administered 2013-04-16: 25 mL

## 2013-04-16 MED ORDER — PANTOPRAZOLE SODIUM 40 MG PO TBEC
80.0000 mg | DELAYED_RELEASE_TABLET | Freq: Every day | ORAL | Status: DC
  Administered 2013-04-17 – 2013-04-19 (×3): 80 mg via ORAL
  Filled 2013-04-16 (×4): qty 2

## 2013-04-16 SURGICAL SUPPLY — 57 items
ADH SKN CLS APL DERMABOND .7 (GAUZE/BANDAGES/DRESSINGS) ×1
BAG SPEC THK2 15X12 ZIP CLS (MISCELLANEOUS) ×1
BAG ZIPLOCK 12X15 (MISCELLANEOUS) ×2 IMPLANT
BANDAGE ELASTIC 6 VELCRO ST LF (GAUZE/BANDAGES/DRESSINGS) ×2 IMPLANT
BANDAGE ESMARK 6X9 LF (GAUZE/BANDAGES/DRESSINGS) ×1 IMPLANT
BLADE SAW SGTL 13.0X1.19X90.0M (BLADE) ×2 IMPLANT
BNDG CMPR 9X6 STRL LF SNTH (GAUZE/BANDAGES/DRESSINGS) ×1
BNDG ESMARK 6X9 LF (GAUZE/BANDAGES/DRESSINGS) ×2
BOWL SMART MIX CTS (DISPOSABLE) ×2 IMPLANT
CAPT RP KNEE ×1 IMPLANT
CEMENT HV SMART SET (Cement) ×2 IMPLANT
CLOTH BEACON ORANGE TIMEOUT ST (SAFETY) ×2 IMPLANT
CUFF TOURN SGL QUICK 44 (TOURNIQUET CUFF) ×1 IMPLANT
DECANTER SPIKE VIAL GLASS SM (MISCELLANEOUS) ×2 IMPLANT
DERMABOND ADVANCED (GAUZE/BANDAGES/DRESSINGS) ×1
DERMABOND ADVANCED .7 DNX12 (GAUZE/BANDAGES/DRESSINGS) ×1 IMPLANT
DRAPE EXTREMITY T 121X128X90 (DRAPE) ×2 IMPLANT
DRAPE POUCH INSTRU U-SHP 10X18 (DRAPES) ×2 IMPLANT
DRAPE U-SHAPE 47X51 STRL (DRAPES) ×2 IMPLANT
DRSG AQUACEL AG ADV 3.5X10 (GAUZE/BANDAGES/DRESSINGS) ×2 IMPLANT
DRSG TEGADERM 4X4.75 (GAUZE/BANDAGES/DRESSINGS) ×2 IMPLANT
DURAPREP 26ML APPLICATOR (WOUND CARE) ×2 IMPLANT
ELECT REM PT RETURN 9FT ADLT (ELECTROSURGICAL) ×2
ELECTRODE REM PT RTRN 9FT ADLT (ELECTROSURGICAL) ×1 IMPLANT
EVACUATOR 1/8 PVC DRAIN (DRAIN) ×2 IMPLANT
FACESHIELD LNG OPTICON STERILE (SAFETY) ×10 IMPLANT
GAUZE SPONGE 2X2 8PLY STRL LF (GAUZE/BANDAGES/DRESSINGS) ×1 IMPLANT
GLOVE BIOGEL PI IND STRL 7.5 (GLOVE) ×1 IMPLANT
GLOVE BIOGEL PI IND STRL 8 (GLOVE) ×1 IMPLANT
GLOVE BIOGEL PI INDICATOR 7.5 (GLOVE) ×1
GLOVE BIOGEL PI INDICATOR 8 (GLOVE) ×1
GLOVE ECLIPSE 8.0 STRL XLNG CF (GLOVE) ×2 IMPLANT
GLOVE ORTHO TXT STRL SZ7.5 (GLOVE) ×4 IMPLANT
GOWN BRE IMP PREV XXLGXLNG (GOWN DISPOSABLE) ×2 IMPLANT
GOWN STRL NON-REIN LRG LVL3 (GOWN DISPOSABLE) ×4 IMPLANT
HANDPIECE INTERPULSE COAX TIP (DISPOSABLE) ×2
KIT BASIN OR (CUSTOM PROCEDURE TRAY) ×2 IMPLANT
MANIFOLD NEPTUNE II (INSTRUMENTS) ×2 IMPLANT
NDL SAFETY ECLIPSE 18X1.5 (NEEDLE) ×1 IMPLANT
NEEDLE HYPO 18GX1.5 SHARP (NEEDLE) ×2
NS IRRIG 1000ML POUR BTL (IV SOLUTION) ×3 IMPLANT
PACK TOTAL JOINT (CUSTOM PROCEDURE TRAY) ×2 IMPLANT
POSITIONER SURGICAL ARM (MISCELLANEOUS) ×2 IMPLANT
SET HNDPC FAN SPRY TIP SCT (DISPOSABLE) ×1 IMPLANT
SET PAD KNEE POSITIONER (MISCELLANEOUS) ×2 IMPLANT
SPONGE GAUZE 2X2 STER 10/PKG (GAUZE/BANDAGES/DRESSINGS) ×1
SUCTION FRAZIER 12FR DISP (SUCTIONS) ×2 IMPLANT
SUT MNCRL AB 4-0 PS2 18 (SUTURE) ×2 IMPLANT
SUT VIC AB 1 CT1 36 (SUTURE) ×2 IMPLANT
SUT VIC AB 2-0 CT1 27 (SUTURE) ×6
SUT VIC AB 2-0 CT1 TAPERPNT 27 (SUTURE) ×3 IMPLANT
SUT VLOC 180 0 24IN GS25 (SUTURE) ×2 IMPLANT
SYR 50ML LL SCALE MARK (SYRINGE) ×2 IMPLANT
TOWEL OR 17X26 10 PK STRL BLUE (TOWEL DISPOSABLE) ×4 IMPLANT
TRAY FOLEY CATH 14FRSI W/METER (CATHETERS) ×2 IMPLANT
WATER STERILE IRR 1500ML POUR (IV SOLUTION) ×3 IMPLANT
WRAP KNEE MAXI GEL POST OP (GAUZE/BANDAGES/DRESSINGS) ×2 IMPLANT

## 2013-04-16 NOTE — Transfer of Care (Addendum)
Immediate Anesthesia Transfer of Care Note  Patient: Selena Bush  Procedure(s) Performed: Procedure(s): RIGHT TOTAL KNEE ARTHROPLASTY (Right)  Patient Location: PACU  Anesthesia Type:Spinal  Level of Consciousness: awake, alert , oriented and patient cooperative  Airway & Oxygen Therapy: Patient Spontanous Breathing and Patient connected to face mask oxygen  Post-op Assessment: Report given to PACU RN, Post -op Vital signs reviewed and stable and Patient moving all extremities X 4  Post vital signs: stable  Complications: No apparent anesthesia complications  S1 spinal level cna move all extremitieis

## 2013-04-16 NOTE — Progress Notes (Signed)
Apresoline 5 mg IVP given for elevated blood pressures-  

## 2013-04-16 NOTE — Plan of Care (Signed)
Problem: Consults Goal: Diagnosis- Total Joint Replacement Primary Total Knee     

## 2013-04-16 NOTE — Progress Notes (Signed)
Blood pressure now 152/76

## 2013-04-16 NOTE — Progress Notes (Signed)
Placed patient on 8 of Cpap with 2lpm o2 bleed in.  Patient is using Medium nasal mask.  Saturation 94%

## 2013-04-16 NOTE — Anesthesia Postprocedure Evaluation (Signed)
Anesthesia Post Note  Patient: Selena Bush  Procedure(s) Performed: Procedure(s) (LRB): RIGHT TOTAL KNEE ARTHROPLASTY (Right)  Anesthesia type: Spinal  Patient location: PACU  Post pain: Pain level controlled  Post assessment: Post-op Vital signs reviewed  Last Vitals: BP 157/77  Pulse 79  Temp(Src) 36.7 C (Oral)  Resp 14  SpO2 95%  Post vital signs: Reviewed  Level of consciousness: sedated  Complications: No apparent anesthesia complications

## 2013-04-16 NOTE — Care Management Note (Addendum)
    Page 1 of 2   04/19/2013     6:59:36 PM   CARE MANAGEMENT NOTE 04/19/2013  Patient:  Selena Bush, Selena Bush   Account Number:  [id number]  Date Initiated:  04/16/2013  Documentation initiated by:  Colleen Can  Subjective/Objective Assessment:   dx total right knee replacemnt     Action/Plan:   Fresh post-op. PT/OT pending. CM will f/u   Anticipated DC Date:  04/19/2013   Anticipated DC Plan:  HOME W HOME HEALTH SERVICES      DC Planning Services  CM consult      [facility] Choice  HOME HEALTH  DURABLE MEDICAL EQUIPMENT   Choice offered to / List presented to:  C-1 Patient   DME arranged  Levan Hurst      DME agency  [facility]     HH arranged  HH-2 PT  HH-3 OT      [facility] agency  [facility]   Status of service:  In process, will continue to follow Medicare Important Message given?   (If response is "NO", the following Medicare IM given date fields will be blank) Date Medicare IM given:   Date Additional Medicare IM given:    Discharge Disposition:  HOME W HOME HEALTH SERVICES  Per UR Regulation:  Reviewed for med. necessity/level of care/duration of stay  If discussed at Long Length of Stay Meetings, dates discussed:    Comments:  04/17/2013 Colleen Can BSN RN CCM [phone] CM spoke with patient. Plans are for her to return to her home in [place] [facility] [place] where husband & housekeeper will be caregivers. RW has been delivered to patient's room. Genevieve Norlander will provide HHpt services with start date of day after discharge.

## 2013-04-16 NOTE — Interval H&P Note (Signed)
History and Physical Interval Note:  04/16/2013 10:28 AM  Selena Bush  has presented today for surgery, with the diagnosis of right knee osteoarthritis  The various methods of treatment have been discussed with the patient and family. After consideration of risks, benefits and other options for treatment, the patient has consented to  Procedure(s): RIGHT TOTAL KNEE ARTHROPLASTY (Right) as a surgical intervention .  The patient's history has been reviewed, patient examined, no change in status, stable for surgery.  I have reviewed the patient's chart and labs.  Questions were answered to the patient's satisfaction.     Shelda Pal

## 2013-04-16 NOTE — Progress Notes (Signed)
Dr. Renold Don in- made aware of patient's blood pressures-orders given.

## 2013-04-16 NOTE — Op Note (Signed)
NAME:  Selena Bush                      MEDICAL RECORD NO.:  [id number]                             FACILITY:  [facility]      PHYSICIAN:  Madlyn Frankel. Charlann Boxer, M.D.  DATE OF BIRTH:  Oct 03, 1945      DATE OF PROCEDURE:  04/16/2013                                     OPERATIVE REPORT         PREOPERATIVE DIAGNOSIS:  Right knee osteoarthritis.      POSTOPERATIVE DIAGNOSIS:  Right knee osteoarthritis.      FINDINGS:  The patient was noted to have complete loss of cartilage and   bone-on-bone arthritis with associated osteophytes in the lateral and patellofemoral compartments of   the knee with a significant synovitis and associated effusion.      PROCEDURE:  Right total knee replacement.      COMPONENTS USED:  DePuy rotating platform posterior stabilized knee   system, a size 3 femur, 2.5 tibia, 10 mm PS insert, and 38 patellar   button.      SURGEON:  Madlyn Frankel. Charlann Boxer, M.D.      ASSISTANT:  Lanney Gins, PA-C.      ANESTHESIA:  Spinal.      SPECIMENS:  None.      COMPLICATION:  None.      DRAINS:  One Hemovac.  EBL: <150cc      TOURNIQUET TIME:   Total Tourniquet Time Documented: Thigh (Right) - 38 minutes Total: Thigh (Right) - 38 minutes  .      The patient was stable to the recovery room.      INDICATION FOR PROCEDURE:  [name] is a 67 y.o. female patient of   mine.  The patient had been seen, evaluated, and treated conservatively in the   office with medication, activity modification, and injections.  The patient had   radiographic changes of bone-on-bone arthritis with endplate sclerosis and osteophytes noted.      The patient failed conservative measures including medication, injections, and activity modification, and at this point was ready for more definitive measures.   Based on the radiographic changes and failed conservative measures, the patient   decided to proceed with total knee replacement.  Risks of infection,   DVT, component failure, need for  revision surgery, postop course, and   expectations were all   discussed and reviewed.  Consent was obtained for benefit of pain   relief.      PROCEDURE IN DETAIL:  The patient was brought to the operative theater.   Once adequate anesthesia, preoperative antibiotics, 2 gm of Ancef administered, the patient was positioned supine with the right thigh tourniquet placed.  The  right lower extremity was prepped and draped in sterile fashion.  A time-   out was performed identifying the patient, planned procedure, and   extremity.      The right lower extremity was placed in the [employer] leg holder.  The leg was   exsanguinated, tourniquet elevated to 250 mmHg.  A midline incision was   made followed by median parapatellar arthrotomy.  Following initial   exposure, attention was first directed  to the patella.  Precut   measurement was noted to be 22 mm.  I resected down to 14 mm and used a   38 patellar button to restore patellar height as well as cover the cut   surface.      The lug holes were drilled and a metal shim was placed to protect the   patella from retractors and saw blades.      At this point, attention was now directed to the femur.  The femoral   canal was opened with a drill, irrigated to try to prevent fat emboli.  An   intramedullary rod was passed at 5 degrees valgus, 10 mm of bone was   resected off the distal femur.  She was noted to have flexed valgus knee with 10 degree flexion contracture preoperatively.  Following this resection, the tibia was   subluxated anteriorly.  Using the extramedullary guide, 4 mm of bone was resected off   the proximal lateral tibia.  We confirmed the gap would be   stable medially and laterally with a 10 mm insert as well as confirmed   the cut was perpendicular in the coronal plane, checking with an alignment rod.      Once this was done, I sized the femur to be a size 3 in the anterior-   posterior dimension, chose a standard component  based on medial and   lateral dimension.  The size 3 rotation block was then pinned in   position anterior referenced using the C-clamp to set rotation.  The   anterior, posterior, and  chamfer cuts were made without difficulty nor   notching making certain that I was along the anterior cortex to help   with flexion gap stability.      The final box cut was made off the lateral aspect of distal femur.      At this point, the tibia was sized to be a size 2.5, the size 2.5 tray was   then pinned in position through the medial third of the tubercle,   drilled, and keel punched.  Trial reduction was now carried with a 3 femur,  2.5 tibia, a 10 mm insert, and the 38 patella botton.  The knee was brought to   extension, full extension with good flexion stability with the patella   tracking through the trochlea without application of pressure.  Given   all these findings, the trial components removed.  Final components were   opened and cement was mixed.  The knee was irrigated with normal saline   solution and pulse lavage.  The synovial lining was   then injected with 0.25% Marcaine with epinephrine and 1 cc of Toradol,   total of 61 cc.      The knee was irrigated.  Final implants were then cemented onto clean and   dried cut surfaces of bone with the knee brought to extension with a 10 mm trial insert.      Once the cement had fully cured, the excess cement was removed   throughout the knee.  I confirmed I was satisfied with the range of   motion and stability, and the final 10 mm PS insert was chosen.  It was   placed into the knee.      The tourniquet had been let down at 38 minutes.  No significant   hemostasis required.  The medium Hemovac drain was placed deep.  The   extensor mechanism was then reapproximated  using #1 Vicryl with the knee   in flexion.  The   remaining wound was closed with 2-0 Vicryl and running 4-0 Monocryl.   The knee was cleaned, dried, dressed sterilely  using Dermabond and   Aquacel dressing.  Drain site dressed separately.  The patient was then   brought to recovery room in stable condition, tolerating the procedure   well.   Please note that Physician Assistant, Lanney Gins, was present for the entirety of the case, and was utilized for pre-operative positioning, peri-operative retractor management, general facilitation of the procedure.  He was also utilized for primary wound closure at the end of the case.              Madlyn Frankel Charlann Boxer, M.D.

## 2013-04-16 NOTE — Anesthesia Preprocedure Evaluation (Addendum)
Anesthesia Evaluation  Patient identified by MRN, date of birth, ID band Patient awake    Reviewed: Allergy & Precautions, H&P , NPO status , Patient's Chart, lab work & pertinent test results, reviewed documented beta blocker date and time   Airway Mallampati: II TM Distance: >3 FB Neck ROM: Full    Dental  (+) Dental Advisory Given   Pulmonary sleep apnea ,  breath sounds clear to auscultation        Cardiovascular hypertension, Pt. on medications and Pt. on home beta blockers negative cardio ROS  Rhythm:Regular Rate:Normal     Neuro/Psych PSYCHIATRIC DISORDERS Anxiety Depression negative neurological ROS     GI/Hepatic Neg liver ROS, GERD-  Medicated,  Endo/Other  Morbid obesity  Renal/GU negative Renal ROS     Musculoskeletal negative musculoskeletal ROS (+)   Abdominal (+) + obese,   Peds  Hematology negative hematology ROS (+)   Anesthesia Other Findings   Reproductive/Obstetrics negative OB ROS                         Anesthesia Physical Anesthesia Plan  ASA: III  Anesthesia Plan: Spinal   Post-op Pain Management:    Induction:   Airway Management Planned:   Additional Equipment:   Intra-op Plan:   Post-operative Plan:   Informed Consent: I have reviewed the patients History and Physical, chart, labs and discussed the procedure including the risks, benefits and alternatives for the proposed anesthesia with the patient or authorized representative who has indicated his/her understanding and acceptance.   Dental advisory given  Plan Discussed with: CRNA  Anesthesia Plan Comments:         Anesthesia Quick Evaluation

## 2013-04-16 NOTE — Anesthesia Procedure Notes (Addendum)
Spinal   Spinal  End time: 04/16/2013 11:46 AM Staffing CRNA/Resident: Draper Gallon E Preanesthetic Checklist Completed: patient identified, site marked, surgical consent, pre-op evaluation, timeout performed, IV checked, risks and benefits discussed and monitors and equipment checked Spinal Block Patient position: sitting Prep: Betadine Patient monitoring: continuous pulse ox, blood pressure and heart rate Approach: midline Location: L2-3 Injection technique: single-shot Needle Needle type: Sprotte  Needle gauge: 24 G Assessment Sensory level: T4 Additional Notes Spinal tray  Lot [id number], expires 01/2014.Pt tolerated procedure well, CSF flow x3. Negative heme and negative paresthesia.

## 2013-04-17 ENCOUNTER — Encounter (HOSPITAL_COMMUNITY): Payer: Self-pay | Admitting: Orthopedic Surgery

## 2013-04-17 DIAGNOSIS — E871 Hypo-osmolality and hyponatremia: Secondary | ICD-10-CM | POA: Diagnosis not present

## 2013-04-17 DIAGNOSIS — E662 Morbid (severe) obesity with alveolar hypoventilation: Secondary | ICD-10-CM | POA: Diagnosis present

## 2013-04-17 DIAGNOSIS — D500 Iron deficiency anemia secondary to blood loss (chronic): Secondary | ICD-10-CM | POA: Diagnosis not present

## 2013-04-17 LAB — CBC
HCT: 26.9 % — ABNORMAL LOW (ref 36.0–46.0)
Hemoglobin: 8.5 g/dL — ABNORMAL LOW (ref 12.0–15.0)
MCH: 24.8 pg — ABNORMAL LOW (ref 26.0–34.0)
MCV: 78.4 fL (ref 78.0–100.0)
Platelets: 242 10*3/uL (ref 150–400)
RBC: 3.43 MIL/uL — ABNORMAL LOW (ref 3.87–5.11)

## 2013-04-17 LAB — BASIC METABOLIC PANEL
BUN: 10 mg/dL (ref 6–23)
CO2: 28 mEq/L (ref 19–32)
Calcium: 8.7 mg/dL (ref 8.4–10.5)
Chloride: 96 mEq/L (ref 96–112)
Creatinine, Ser: 0.74 mg/dL (ref 0.50–1.10)
Glucose, Bld: 128 mg/dL — ABNORMAL HIGH (ref 70–99)

## 2013-04-17 MED ORDER — FERROUS SULFATE 325 (65 FE) MG PO TABS: 325.0000 mg | ORAL_TABLET | Freq: Three times a day (TID) | ORAL | Status: DC

## 2013-04-17 MED ORDER — TIZANIDINE HCL 4 MG PO TABS: 4.0000 mg | ORAL_TABLET | Freq: Two times a day (BID) | ORAL | Status: DC

## 2013-04-17 MED ORDER — DSS 100 MG PO CAPS: 100.0000 mg | ORAL_CAPSULE | Freq: Two times a day (BID) | ORAL | Status: DC

## 2013-04-17 MED ORDER — OXYCODONE HCL 5 MG PO TABS: 5.0000 mg | ORAL_TABLET | ORAL | Status: DC | PRN

## 2013-04-17 MED ORDER — POLYETHYLENE GLYCOL 3350 17 G PO PACK: 17.0000 g | PACK | Freq: Two times a day (BID) | ORAL | Status: DC

## 2013-04-17 MED ORDER — RIVAROXABAN 10 MG PO TABS: 10.0000 mg | ORAL_TABLET | ORAL | Status: DC

## 2013-04-17 MED ORDER — HYDROCODONE-ACETAMINOPHEN 7.5-325 MG PO TABS: 1.0000 | ORAL_TABLET | ORAL | Status: DC | PRN

## 2013-04-17 MED ORDER — OXYCODONE HCL 5 MG PO TABS
5.0000 mg | ORAL_TABLET | ORAL | Status: DC
  Administered 2013-04-17: 15 mg via ORAL
  Administered 2013-04-17: 5 mg via ORAL
  Administered 2013-04-17: 15 mg via ORAL
  Administered 2013-04-17: 5 mg via ORAL
  Administered 2013-04-17 – 2013-04-18 (×3): 15 mg via ORAL
  Filled 2013-04-17: qty 3
  Filled 2013-04-17: qty 1
  Filled 2013-04-17 (×2): qty 3
  Filled 2013-04-17: qty 1
  Filled 2013-04-17 (×2): qty 3

## 2013-04-17 NOTE — Progress Notes (Signed)
[facility] is providing the following services: RW (patient declined commode)  If patient discharges after hours, please call [phone].   Renard Hamper 04/17/2013, 10:35 AM

## 2013-04-17 NOTE — Progress Notes (Signed)
04/17/13 1356  PT Visit Information  Last PT Received On 04/17/13  Assistance Needed +1  History of Present Illness s/p R TKA  PT Time Calculation  PT Start Time 1330  PT Stop Time 1356  PT Time Calculation (min) 26 min  Subjective Data  Patient Stated Goal home with husband  Precautions  Precautions Knee  Restrictions  Other Position/Activity Restrictions WBAT  Cognition  Arousal/Alertness Awake/alert  Behavior During Therapy WFL for tasks assessed/performed  Overall Cognitive Status Within Functional Limits for tasks assessed  Bed Mobility  Bed Mobility Sit to Supine  Sit to Supine 4: Min assist;HOB flat  Details for Bed Mobility Assistance verbal cues ror technique  Transfers  Transfers Sit to Stand;Stand to Sit  Sit to Stand 4: Min assist;4: Min guard  Stand to Sit 4: Min guard;4: Min assist;To bed  Details for Transfer Assistance cues for hand placement and LE management  Ambulation/Gait  Ambulation/Gait Assistance 4: Min assist  Ambulation Distance (Feet) 40 Feet  Assistive device Rolling walker  Ambulation/Gait Assistance Details cues for sequence and use of RW  Gait Pattern Step-to pattern;Antalgic  Total Joint Exercises  Ankle Circles/Pumps AROM;Both;10 reps  Quad Sets AROM;Both;10 reps  Heel Slides AROM;10 reps;Right;AAROM  Hip ABduction/ADduction AAROM;Right;10 reps  Straight Leg Raises AROM;AAROM;Right;10 reps  PT - End of Session  Equipment Utilized During Treatment Gait belt  Activity Tolerance Patient limited by pain  Patient left in bed;with call bell/phone within reach;with family/visitor present  Nurse Communication Mobility status;Patient requests pain meds  PT - Assessment/Plan  PT Plan Current plan remains appropriate  PT Frequency 7X/week  Follow Up Recommendations Home health PT;Supervision - Intermittent  PT equipment Rolling walker with 5" wheels  PT Goal Progression  Progress towards PT goals Progressing toward goals  Acute Rehab PT Goals   Time For Goal Achievement 04/22/13  Potential to Achieve Goals Good  PT General Charges  $$ ACUTE PT VISIT 1 Procedure  PT Treatments  $Gait Training 8-22 mins  $Therapeutic Exercise 8-22 mins

## 2013-04-17 NOTE — Evaluation (Signed)
Occupational Therapy Evaluation Patient Details Name: Selena Bush MRN: [id number] DOB: Mar 02, 1946 Today's Date: 04/17/2013 Time: 1209-1228 OT Time Calculation (min): 19 min  OT Assessment / Plan / Recommendation History of present illness s/p R TKA   Clinical Impression   Pt was admitted for R TKA.  Will follow in acute for continued education for limited adls and toilet transfers.  Husband will be helping her at home; she will likely not need follow up OT post acute.  Pt had assistance with LB adls PTA.     OT Assessment  Patient needs continued OT Services    Follow Up Recommendations  No OT follow up    Barriers to Discharge      Equipment Recommendations  3 in 1 bedside comode  PLEASE HAVE VENDOR PROVIDE PRICING INFORMATION   Recommendations for Other Services    Frequency  Min 2X/week    Precautions / Restrictions Precautions Precautions: Knee Restrictions Other Position/Activity Restrictions: WBAT   Pertinent Vitals/Pain R knee sore:  Repositioned.  Pt did not want ice reapplied    ADL  Grooming: Set up Where Assessed - Grooming: Unsupported sitting Upper Body Bathing: Set up Where Assessed - Upper Body Bathing: Unsupported sitting Lower Body Bathing: Moderate assistance Where Assessed - Lower Body Bathing: Supported sit to stand Upper Body Dressing: Minimal assistance (iv) Where Assessed - Upper Body Dressing: Unsupported sitting Lower Body Dressing: Maximal assistance Where Assessed - Lower Body Dressing: Supported sit to stand ADL Comments: pt is not interested in AE.  Husband has been assisting prior to sx and he will assist afterwards.  Pt is interested in a 3:1 but she wants to know pricing information.  Reviewed tub readiness.  Husband was assisting her to step over tub prior to sx--explained that she may not be ready to do this right after sx. Educated that HHPT can try this with her when she feels ready.  Pt did not want to walk to bathroom as she hasn't  walked that far yet.      OT Diagnosis: Generalized weakness  OT Problem List: Decreased strength;Decreased activity tolerance;Decreased knowledge of use of DME or AE;Pain OT Treatment Interventions: Self-care/ADL training;DME and/or AE instruction;Patient/family education   OT Goals(Current goals can be found in the care plan section) Acute Rehab OT Goals Patient Stated Goal: home with husband OT Goal Formulation: With patient Time For Goal Achievement: 04/24/13 Potential to Achieve Goals: Good ADL Goals Pt Will Perform Grooming: with supervision;standing Pt Will Transfer to Toilet: with min assist;ambulating;bedside commode Pt Will Perform Toileting - Clothing Manipulation and hygiene: with supervision;sit to/from stand  Visit Information  Last OT Received On: 04/17/13 Assistance Needed: +1 History of Present Illness: s/p R TKA       Prior Functioning     Home Living Family/patient expects to be discharged to:: Private residence Living Arrangements: Spouse/significant other Type of Home: House Home Access: Stairs to enter Secretary/administrator of Steps: 3-4 Entrance Stairs-Rails: Right Home Layout: One level Home Equipment: Walker - standard;Shower seat Additional Comments: has housekeeper comes about weekdays and stays for about four hours Prior Function Level of Independence: Needs assistance Gait / Transfers Assistance Needed: I amb Comments: help with sock/shoe Communication Communication: No difficulties         Vision/Perception     Cognition  Cognition Arousal/Alertness: Awake/alert Behavior During Therapy: WFL for tasks assessed/performed Overall Cognitive Status: Within Functional Limits for tasks assessed    Extremity/Trunk Assessment Upper Extremity Assessment Upper Extremity Assessment: Overall WFL for  tasks assessed (R worse than L for arthritis:  stays below 90 )     Mobility  Transfers Sit to Stand: 4: Min assist Stand to Sit: 4: Min  assist Details for Transfer Assistance: cues for hand placemetn and LE management     Exercise     Balance     End of Session OT - End of Session Activity Tolerance: Patient tolerated treatment well Patient left: in chair;with call bell/phone within reach;with family/visitor present  GO     Stein Windhorst 04/17/2013, 1:39 PM Marica Otter, OTR/L [phone] 04/17/2013

## 2013-04-17 NOTE — Progress Notes (Signed)
04/17/2013 Colleen Can BSN RN CCM [phone] CM spoke with patient. Plans are for her to return to her home in [facility] where husband & housekeeper will be caregivers. RW has been delivered to patient's room. Genevieve Norlander will provide HHpt services with start date of day after discharge.

## 2013-04-17 NOTE — Progress Notes (Signed)
   Subjective: 1 Day Post-Op Procedure(s) (LRB): RIGHT TOTAL KNEE ARTHROPLASTY (Right)   Patient reports pain as mild, pain well controlled. No events throughout the night. Ready to be discharged home if does well with PT and pain stays controlled.   Objective:   VITALS:   Filed Vitals:   04/17/13 0629  BP: 172/76  Pulse: 78  Temp: 98.3 F (36.8 C)  Resp: 16    Neurovascular intact Dorsiflexion/Plantar flexion intact Incision: dressing C/D/I No cellulitis present Compartment soft  LABS  Recent Labs  04/17/13 0413  HGB 8.5*  HCT 26.9*  WBC 9.6  PLT 242     Recent Labs  04/17/13 0413  NA 130*  K 3.6  BUN 10  CREATININE 0.74  GLUCOSE 128*     Assessment/Plan: 1 Day Post-Op Procedure(s) (LRB): RIGHT TOTAL KNEE ARTHROPLASTY (Right) HV drain d/c'ed Foley cath d/c'ed Advance diet Up with therapy D/C IV fluids Discharge home with home health Follow up in 2 weeks at [facility]. Follow up with OLIN,Kemontae Dunklee D in 2 weeks.  Contact information:  [facility] [address] [place] [address] [phone]    Expected ABLA  Treated with iron and will observe  Morbid Obesity (BMI >40)  Estimated body mass index is 47.65 kg/(m^2) as calculated from the following:   Height as of this encounter: 5' (1.524 m).   Weight as of this encounter: 110.678 kg (244 lb). Patient also counseled that weight may inhibit the healing process Patient counseled that losing weight will help with future health issues  Hyponatremia Treated with IV fluids and will observe       Anastasio Auerbach. Decie Verne   PAC  04/17/2013, 8:35 AM

## 2013-04-17 NOTE — Evaluation (Signed)
Physical Therapy Evaluation Patient Details Name: Selena Bush MRN: [id number] DOB: [birth year] Today's Date: 04/17/2013 Time: 0928-1000 PT Time Calculation (min): 32 min  PT Assessment / Plan / Recommendation History of Present Illness  s/p R TKA  Clinical Impression  Pt very tearful due to pain today; mobility limited    PT Assessment  Patient needs continued PT services    Follow Up Recommendations  Home health PT    Does the patient have the potential to tolerate intense rehabilitation      Barriers to Discharge        Equipment Recommendations  Rolling walker with 5" wheels (pt has standard walker ??newer one)    Recommendations for Other Services     Frequency 7X/week    Precautions / Restrictions Precautions Precautions: Knee Restrictions Other Position/Activity Restrictions: WBAT   Pertinent Vitals/Pain Pain 10/10, RN gave meds      Mobility  Bed Mobility Bed Mobility: Supine to Sit Supine to Sit: 4: Min assist Details for Bed Mobility Assistance: verbal cues or technique Transfers Transfers: Sit to Stand;Stand to Sit;Stand Pivot Transfers Sit to Stand: 4: Min assist Stand to Sit: 4: Min assist Stand Pivot Transfers: 4: Min assist Details for Transfer Assistance: cues for hand placemetn and LE management Ambulation/Gait Ambulation/Gait Assistance: 4: Min assist Ambulation Distance (Feet): 3 Feet Assistive device: Rolling walker Ambulation/Gait Assistance Details: cues for sequence and use of RW Gait Pattern: Step-to pattern;Antalgic    Exercises     PT Diagnosis:    PT Problem List: Decreased strength;Decreased range of motion;Decreased activity tolerance;Decreased balance;Decreased mobility;Decreased knowledge of use of DME PT Treatment Interventions: DME instruction;Gait training;Functional mobility training;Therapeutic activities;Therapeutic exercise;Patient/family education;Stair training     PT Goals(Current goals can be found in the care  plan section) Acute Rehab PT Goals Patient Stated Goal: home with husband PT Goal Formulation: With patient Time For Goal Achievement: 04/22/13 Potential to Achieve Goals: Good  Visit Information  Last PT Received On: 04/17/13 Assistance Needed: +1 History of Present Illness: s/p R TKA       Prior Functioning  Home Living Family/patient expects to be discharged to:: Private residence Living Arrangements: Spouse/significant other Type of Home: House Home Access: Stairs to enter Secretary/administrator of Steps: 3-4 Entrance Stairs-Rails: Right Home Layout: One level Home Equipment: Walker - standard;Shower seat Additional Comments: has housekeeper comes about weekdays and stays for about four hours Prior Function Level of Independence: Needs assistance Gait / Transfers Assistance Needed: I amb Comments: help with sock/shoe Communication Communication: No difficulties    Cognition  Cognition Arousal/Alertness: Awake/alert Behavior During Therapy: WFL for tasks assessed/performed Overall Cognitive Status: Within Functional Limits for tasks assessed    Extremity/Trunk Assessment Upper Extremity Assessment Upper Extremity Assessment: Overall WFL for tasks assessed (R worse than L for arthritis:  stays below 90 ) Lower Extremity Assessment Lower Extremity Assessment: RLE deficits/detail RLE Deficits / Details: able to assist with SLR RLE: Unable to fully assess due to pain   Balance    End of Session PT - End of Session Equipment Utilized During Treatment: Gait belt Activity Tolerance: Patient limited by pain Patient left: in chair;with call bell/phone within reach Nurse Communication: Mobility status  GP     [facility] 04/17/2013, 1:21 PM

## 2013-04-18 LAB — BASIC METABOLIC PANEL
BUN: 12 mg/dL (ref 6–23)
Calcium: 8.8 mg/dL (ref 8.4–10.5)
Creatinine, Ser: 0.81 mg/dL (ref 0.50–1.10)
GFR calc Af Amer: 87 mL/min — ABNORMAL LOW (ref >90)

## 2013-04-18 LAB — CBC
HCT: 26 % — ABNORMAL LOW (ref 36.0–46.0)
MCH: 25.9 pg — ABNORMAL LOW (ref 26.0–34.0)
MCHC: 33.1 g/dL (ref 30.0–36.0)
MCV: 78.3 fL (ref 78.0–100.0)
Platelets: 230 10*3/uL (ref 150–400)
RDW: 16.3 % — ABNORMAL HIGH (ref 11.5–15.5)

## 2013-04-18 MED ORDER — KETOROLAC TROMETHAMINE 15 MG/ML IJ SOLN
15.0000 mg | Freq: Four times a day (QID) | INTRAMUSCULAR | Status: DC
  Administered 2013-04-18: 15 mg via INTRAVENOUS
  Filled 2013-04-18: qty 1

## 2013-04-18 MED ORDER — LORAZEPAM 0.5 MG PO TABS
0.5000 mg | ORAL_TABLET | Freq: Two times a day (BID) | ORAL | Status: DC | PRN
  Administered 2013-04-18 – 2013-04-19 (×2): 0.5 mg via ORAL
  Filled 2013-04-18 (×2): qty 1

## 2013-04-18 MED ORDER — HYDROMORPHONE HCL 2 MG PO TABS
2.0000 mg | ORAL_TABLET | ORAL | Status: DC
  Administered 2013-04-18: 4 mg via ORAL
  Administered 2013-04-18 (×2): 2 mg via ORAL
  Administered 2013-04-19: 4 mg via ORAL
  Administered 2013-04-19: 2 mg via ORAL
  Filled 2013-04-18: qty 2
  Filled 2013-04-18: qty 1
  Filled 2013-04-18 (×2): qty 2
  Filled 2013-04-18: qty 1

## 2013-04-18 NOTE — Progress Notes (Signed)
Occupational Therapy Treatment Patient Details Name: Dalynn Jhaveri MRN: [id number] DOB: 09-03-1945 Today's Date: 04/18/2013 Time: 4098-1191 OT Time Calculation (min): 25 min  OT Assessment / Plan / Recommendation  History of present illness s/p R TKA   OT comments  Pt requires Min A for BADLs today.  Pt is groggy - likely due to pain meds.  Pt requires cues and assist for walker safety.   Pt may benefit from SNF level rehab.  Follow Up Recommendations  SNF;Supervision/Assistance - 24 hour    Barriers to Discharge       Equipment Recommendations  3 in 1 bedside comode    Recommendations for Other Services    Frequency Min 2X/week   Progress towards OT Goals Progress towards OT goals: Progressing toward goals  Plan Discharge plan needs to be updated    Precautions / Restrictions Precautions Precautions: Knee Restrictions Weight Bearing Restrictions: No Other Position/Activity Restrictions: WBAT   Pertinent Vitals/Pain     ADL  Eating/Feeding: Independent Where Assessed - Eating/Feeding: Chair Grooming: Wash/dry hands;Minimal assistance Where Assessed - Grooming: Supported standing Upper Body Bathing: Set up Where Assessed - Upper Body Bathing: Unsupported sitting Lower Body Bathing: Minimal assistance Where Assessed - Lower Body Bathing: Supported sit to stand Upper Body Dressing: Set up Where Assessed - Upper Body Dressing: Unsupported sitting Lower Body Dressing: Moderate assistance Where Assessed - Lower Body Dressing: Supported sit to Pharmacist, hospital: Minimal assistance Toilet Transfer Method: Sit to stand;Stand pivot Acupuncturist: Comfort height toilet;Grab bars Toileting - Clothing Manipulation and Hygiene: Minimal assistance Where Assessed - Engineer, mining and Hygiene: Standing Equipment Used: Rolling walker Transfers/Ambulation Related to ADLs: min guard assist and mod verbal cues for walker sequence ADL Comments:  Discussed appropriate shoes to w3ear at home, as pt reports she has been wearing slip on sandles.  Instructed her to wear a supportive shoe with a back, or the grippy socks she has been wearing here.  She verabliaed understanding .  Pt with difficulty accessing peri area for hygiene.  Discussed standing to perform and was able to do with min A    OT Diagnosis:    OT Problem List:   OT Treatment Interventions:     OT Goals(current goals can now be found in the care plan section) Acute Rehab OT Goals Patient Stated Goal: home with husband OT Goal Formulation: With patient Time For Goal Achievement: 04/24/13 Potential to Achieve Goals: Good ADL Goals Pt Will Perform Grooming: with supervision;standing Pt Will Transfer to Toilet: with min assist;ambulating;bedside commode Pt Will Perform Toileting - Clothing Manipulation and hygiene: with supervision;sit to/from stand  Visit Information  Last OT Received On: 04/18/13 Assistance Needed: +1 History of Present Illness: s/p R TKA    Subjective Data      Prior Functioning       Cognition  Cognition Arousal/Alertness: Lethargic;Suspect due to medications Behavior During Therapy: [facility] for tasks assessed/performed Overall Cognitive Status: Within Functional Limits for tasks assessed    Mobility  Bed Mobility Bed Mobility: Not assessed Transfers Transfers: Sit to Stand;Stand to Sit Sit to Stand: 4: Min assist;With upper extremity assist;From chair/3-in-1;From toilet Stand to Sit: 4: Min assist;To chair/3-in-1;With upper extremity assist Details for Transfer Assistance: verbal cuse for hand placement and LE managemetn    Exercises  Total Joint Exercises Ankle Circles/Pumps: AROM;Both;10 reps Quad Sets: AROM;Both;10 reps Heel Slides: AROM;10 reps;Right;AAROM   Balance     End of Session OT - End of Session Equipment Utilized During Treatment:  Rolling walker Activity Tolerance: Patient tolerated treatment well Patient left: in  chair;with call bell/phone within reach;with family/visitor present  GO     Brevin Mcfadden, Ursula Alert M 04/18/2013, 1:26 PM

## 2013-04-18 NOTE — Progress Notes (Signed)
Clinical Social Work Department CLINICAL SOCIAL WORK PLACEMENT NOTE 04/18/2013  Patient:  Selena Bush, Selena Bush  Account Number:  [id number] Admit date:  04/16/2013  Clinical Social Worker:  Cori Razor, LCSW  Date/time:  04/18/2013 03:30 PM  Clinical Social Work is seeking post-discharge placement for this patient at the following level of care:   SKILLED NURSING   (*CSW will update this form in Epic as items are completed)   04/18/2013  Patient/family provided with [facility] Department of Clinical Social Work's list of facilities offering this level of care within the geographic area requested by the patient (or if unable, by the patient's family).  04/18/2013  Patient/family informed of their freedom to choose among providers that offer the needed level of care, that participate in Medicare, Medicaid or managed care program needed by the patient, have an available bed and are willing to accept the patient.    Patient/family informed of [facility]' ownership interest in [facility], as well as of the fact that they are under no obligation to receive care at this facility.  PASARR submitted to EDS on 04/18/2013 PASARR number received from EDS on 04/18/2013  FL2 transmitted to all facilities in geographic area requested by pt/family on  04/18/2013 FL2 transmitted to all facilities within larger geographic area on   Patient informed that his/her managed care company has contracts with or will negotiate with  certain facilities, including the following:     Patient/family informed of bed offers received:  04/18/2013 Patient chooses bed at [facility] Physician recommends and patient chooses bed at    Patient to be transferred to  on   Patient to be transferred to facility by   The following physician request were entered in Epic:   Additional Comments:  Cori Razor LCSW [phone]

## 2013-04-18 NOTE — Progress Notes (Signed)
Clinical Social Work Department BRIEF PSYCHOSOCIAL ASSESSMENT 04/18/2013  Patient:  Selena Bush, Selena Bush     Account Number:  [id number]     Admit date:  04/16/2013  Clinical Social Worker:  Candie Chroman  Date/Time:  04/18/2013 02:26 PM  Referred by:  Physician  Date Referred:  04/18/2013 Referred for  SNF Placement   Other Referral:   Interview type:  Patient Other interview type:    PSYCHOSOCIAL DATA Living Status:  FAMILY Admitted from facility:   Level of care:   Primary support name:  Selena Bush Primary support relationship to patient:  SPOUSE Degree of support available:   supportive    CURRENT CONCERNS Current Concerns  Post-Acute Placement   Other Concerns:    SOCIAL WORK ASSESSMENT / PLAN Pt is a 67 yr old female living at home prior to hospitalization. CSW met with pt / spouse to assist with d/c planning. Pt feels ST Rehab is needed following hospital d/c. Pt has chosen [facility] for rehab. SNF has made a bed offer and has requested authorization from [facility] for SNF placement. No decision from [employer] has been made at this time. CSW will continue to follow to assist with d/c planning.   Assessment/plan status:  Psychosocial Support/Ongoing Assessment of Needs Other assessment/ plan:   HH services will be needed if [employer] does not provide authorization for SNF placement.   Information/referral to community resources:   Insurance coverage for SNF placement reviewed with pt / spouse.    PATIENT'S/FAMILY'S RESPONSE TO PLAN OF CARE: Pt is looking forward to feeling better and having rehab at [facility].   Cori Razor LCSW [phone]

## 2013-04-18 NOTE — Progress Notes (Signed)
   Subjective: 2 Days Post-Op Procedure(s) (LRB): RIGHT TOTAL KNEE ARTHROPLASTY (Right)   Patient reports pain as severe, pain not well controlled. Tearfully she explains that she has these time of increased pain, but not all the time. She feels that she did well with PT, but scared to get up today because of the pain. She says that her husband doesn't feel that he will be able to care for her and they would like to look into SNF placement. Which would be appropriate with her limitations and abilities to function with the knee replacement.  Objective:   VITALS:   Filed Vitals:   04/18/13  BP: 154/79  Pulse: 83  Temp: 99.1 F (37.3 C)   Resp: 16    Neurovascular intact Dorsiflexion/Plantar flexion intact Incision: dressing C/D/I No cellulitis present Compartment soft  LABS  Recent Labs  04/17/13 0413 04/18/13 0439  HGB 8.5* 8.6*  HCT 26.9* 26.0*  WBC 9.6 11.9*  PLT 242 230     Recent Labs  04/17/13 0413 04/18/13 0439  NA 130* 132*  K 3.6 3.4*  BUN 10 12  CREATININE 0.74 0.81  GLUCOSE 128* 125*     Assessment/Plan: 2 Days Post-Op Procedure(s) (LRB): RIGHT TOTAL KNEE ARTHROPLASTY (Right) Up with therapy Discharge to SNF eventually when ready Consult to social worker was ordered Analgesic medication changed from Oxycodone to Dilaudid PO Restarted Toradol for 4 doses. Ativan added if needed for anxiety  Expected ABLA  Treated with iron and will observe   Morbid Obesity (BMI >40)  Estimated body mass index is 47.65 kg/(m^2) as calculated from the following:      Height as of this encounter: 5' (1.524 m).      Weight as of this encounter: 110.678 kg (244 lb).  Patient also counseled that weight may inhibit the healing process  Patient counseled that losing weight will help with future health issues   Hyponatremia  Treated with IV fluids and will observe      Anastasio Auerbach. Selena Bush   PAC  04/18/2013, 9:17 AM

## 2013-04-18 NOTE — Progress Notes (Signed)
CSW consulted for SNF placement. PN reviewed. PT is recommending HH PT following hospital d/c. RNCM will assist with d/c planning. CSW ia available to assist if plan changes and SNF is needed.  Cori Razor LCSW [phone]

## 2013-04-18 NOTE — Progress Notes (Signed)
Physical Therapy Treatment Patient Details Name: Selena Bush MRN: [id number] DOB: 06/20/1946 Today's Date: 04/18/2013 Time: 0931-1000 PT Time Calculation (min): 29 min  PT Assessment / Plan / Recommendation  History of Present Illness s/p R TKA   PT Comments   May benefit from SNF  Follow Up Recommendations  Supervision - Intermittent;SNF     Does the patient have the potential to tolerate intense rehabilitation     Barriers to Discharge        Equipment Recommendations       Recommendations for Other Services    Frequency 7X/week   Progress towards PT Goals Progress towards PT goals: Progressing toward goals  Plan Discharge plan needs to be updated    Precautions / Restrictions Precautions Precautions: Knee Restrictions Weight Bearing Restrictions: No Other Position/Activity Restrictions: WBAT   Pertinent Vitals/Pain  premedicated, c/o pain with activity     Mobility  Bed Mobility Bed Mobility: Not assessed Transfers Transfers: Sit to Stand;Stand to Sit Sit to Stand: 4: Min assist Stand to Sit: 4: Min assist Details for Transfer Assistance: cues for hand placement and LE management Ambulation/Gait Ambulation/Gait Assistance: 4: Min assist Ambulation Distance (Feet): 30 Feet Assistive device: Rolling walker Ambulation/Gait Assistance Details: verbal cues for sequence and use of RW Gait Pattern: Step-to pattern;Antalgic    Exercises Total Joint Exercises Ankle Circles/Pumps: AROM;Both;10 reps Quad Sets: AROM;Both;10 reps Heel Slides: AROM;10 reps;Right;AAROM   PT Diagnosis:    PT Problem List:   PT Treatment Interventions:     PT Goals (current goals can now be found in the care plan section) Acute Rehab PT Goals Patient Stated Goal: home with husband Time For Goal Achievement: 04/22/13  Visit Information  Last PT Received On: 04/18/13 Assistance Needed: +1 History of Present Illness: s/p R TKA    Subjective Data  Patient Stated Goal: home with  husband   Cognition  Cognition Arousal/Alertness: Awake/alert Behavior During Therapy: WFL for tasks assessed/performed Overall Cognitive Status: Within Functional Limits for tasks assessed    Balance     End of Session PT - End of Session Equipment Utilized During Treatment: Gait belt Activity Tolerance: Patient limited by fatigue;Patient limited by pain Patient left: in chair;with call bell/phone within reach;with family/visitor present Nurse Communication: Mobility status   GP     [facility] 04/18/2013, 10:19 AM

## 2013-04-19 MED ORDER — LORAZEPAM 0.5 MG PO TABS: 0.5000 mg | ORAL_TABLET | Freq: Two times a day (BID) | ORAL | Status: DC | PRN

## 2013-04-19 MED ORDER — HYDROMORPHONE HCL 2 MG PO TABS: 2.0000 mg | ORAL_TABLET | ORAL | Status: DC

## 2013-04-19 NOTE — Discharge Summary (Signed)
Physician Discharge Summary  Patient ID: Selena Bush MRN: [id number] DOB/AGE: Jan 11, 1946 67 y.o.  Admit date: 04/16/2013 Discharge date: 04/19/2013   Procedures:  Procedure(s) (LRB): RIGHT TOTAL KNEE ARTHROPLASTY (Right)  Attending Physician:  Dr. Durene Romans   Admission Diagnoses:   Right knee OA / pain.   Discharge Diagnoses:  Principal Problem:   S/P right TKA Active Problems:   Expected blood loss anemia   Morbid obesity   Hyponatremia  Past Medical History  Diagnosis Date  . Hypertension   . Diffuse cystic mastopathy 2014  . Anxiety   . Depression   . Pain     LUMBAR PAIN -DDD  . GERD (gastroesophageal reflux disease)   . Mitral insufficiency   . Arthritis     OA BOTH KNEES  . Hypercholesterolemia   . Sleep difficulties     LUNESTA HAS HELPED  . Cancer 10/2012    PARTIAL RIGHT MASTECTOMY FOR BREAST CANCER--HAD RADIATION - NO CHEMO --DR. CRYSTAL Ramos ONCOLOGIST  . Sleep apnea     USES CPAP    HPI: Selena Bush, 67 y.o. female, has a history of pain and functional disability in the right knee due to arthritis and has failed non-surgical conservative treatments for greater than 12 weeks to includecorticosteriod injections, viscosupplementation injections, use of assistive devices and activity modification. Onset of symptoms was gradual, starting 5 years ago with rapidlly worsening course since that time. The patient noted no past surgery on the right knee(s). Patient currently rates pain in the right knee(s) at 8 out of 10 with activity. Patient has night pain, worsening of pain with activity and weight bearing, pain that interferes with activities of daily living, pain with passive range of motion, crepitus and joint swelling. Patient has evidence of periarticular osteophytes and joint space narrowing by imaging studies. There is no active signs of infection. Risks, benefits and expectations were discussed with the patient. Patient understand the risks,  benefits and expectations and wishes to proceed with surgery.   PCP: Lorie Phenix, MD   Discharged Condition: good  Hospital Course:  Patient underwent the above stated procedure on 04/16/2013. Patient tolerated the procedure well and brought to the recovery room in good condition and subsequently to the floor.  POD #1 BP: 172/76 ; Pulse: 78 ; Temp: 98.3 F (36.8 C) ; Resp: 16  Pt's foley was removed, as well as the hemovac drain removed. IV was changed to a saline lock. Patient reports pain as mild, pain well controlled. No events throughout the night. Neurovascular intact, dorsiflexion/plantar flexion intact, incision: dressing C/D/I, no cellulitis present and compartment soft.   LABS  Basename    HGB  8.5  HCT  26.9   POD #2  BP: 154/79 ; Pulse: 83 ; Temp: 99.1 F (37.3 C) ; Resp: 16 Patient reports pain as severe, pain not well controlled. Tearfully she explains that she has these time of increased pain, but not all the time. She feels that she did well with PT, but scared to get up today because of the pain. She says that her husband doesn't feel that he will be able to care for her and they would like to look into SNF placement.  Neurovascular intact, dorsiflexion/plantar flexion intact, incision: dressing C/D/I, no cellulitis present and compartment soft.   LABS  Basename    HGB  8.6  HCT  26.0   POD #3  BP: 164/84 ; Pulse: 84 ; Temp: 99.5 F (37.5 C) ; Resp: 16  Patient reports  pain as moderate. Once pain was better controlled yesterday she was able to tolerate activity and therapy better thus she is wanting to go home today.    Neurovascular intact, dorsiflexion/plantar flexion intact, incision: dressing C/D/I, no cellulitis present and compartment soft.   LABS   No new labs  Discharge Exam: General appearance: alert, cooperative and no distress Extremities: Homans sign is negative, no sign of DVT, no edema, redness or tenderness in the calves or thighs and no ulcers,  gangrene or trophic changes  Disposition:   Home or Self Care with follow up in 2 weeks   Follow-up Information   Follow up with Shelda Pal, MD. Schedule an appointment as soon as possible for a visit in 2 weeks.   Specialty:  Orthopedic Surgery   Contact information:   [address] [place] [address] [phone]       Discharge Orders   Future Orders Complete By Expires   Call MD / Call 911  As directed    Comments:     If you experience chest pain or shortness of breath, CALL 911 and be transported to the hospital emergency room.  If you develope a fever above 101 F, pus (white drainage) or increased drainage or redness at the wound, or calf pain, call your surgeon's office.   Change dressing  As directed    Comments:     Maintain surgical dressing for 10-14 days, then change the dressing daily with sterile 4 x 4 inch gauze dressing and tape. Keep the area dry and clean.   Constipation Prevention  As directed    Comments:     Drink plenty of fluids.  Prune juice may be helpful.  You may use a stool softener, such as Colace (over the counter) 100 mg twice a day.  Use MiraLax (over the counter) for constipation as needed.   Diet - low sodium heart healthy  As directed    Discharge instructions  As directed    Comments:     Maintain surgical dressing for 10-14 days, then replace with gauze and tape. Keep the area dry and clean until follow up. Follow up in 2 weeks at [facility]. Call with any questions or concerns.   Driving restrictions  As directed    Comments:     No driving for 4 weeks   Increase activity slowly as tolerated  As directed    TED hose  As directed    Comments:     Use stockings (TED hose) for 2 weeks on both leg(s).  You may remove them at night for sleeping.   Weight bearing as tolerated  As directed         Medication List    STOP taking these medications       Diclofenac Sodium Powd     ibuprofen 200 MG  tablet  Commonly known as:  ADVIL,MOTRIN     PENNSAID 1.5 % Soln  Generic drug:  Diclofenac Sodium      TAKE these medications       atenolol 50 MG tablet  Commonly known as:  TENORMIN  Take 50 mg by mouth every morning.     beta carotene 91478 UNIT capsule  Take 25,000 Units by mouth daily.     busPIRone 15 MG tablet  Commonly known as:  BUSPAR  Take 30 mg by mouth at bedtime.     DSS 100 MG Caps  Take 100 mg by mouth 2 (two) times  daily.     DULoxetine 60 MG capsule  Commonly known as:  CYMBALTA  Take 60 mg by mouth every morning.     ferrous sulfate 325 (65 FE) MG tablet  Take 1 tablet (325 mg total) by mouth 3 (three) times daily after meals.     fish oil-omega-3 fatty acids 1000 MG capsule  Take 1 g by mouth daily.     fluticasone 50 MCG/ACT nasal spray  Commonly known as:  FLONASE  Place 1 spray into the nose daily as needed for rhinitis or allergies.     furosemide 40 MG tablet  Commonly known as:  LASIX  Take 40 mg by mouth 2 (two) times daily.     GLUCOSAMINE CHONDROITIN JOINT PO  Take by mouth 2 (two) times daily.     HYDROmorphone 2 MG tablet  Commonly known as:  DILAUDID  Take 1-2 tablets (2-4 mg total) by mouth every 4 (four) hours.     lisinopril 20 MG tablet  Commonly known as:  PRINIVIL,ZESTRIL  Take 20 mg by mouth daily with breakfast.     LORazepam 0.5 MG tablet  Commonly known as:  ATIVAN  Take 1 tablet (0.5 mg total) by mouth 2 (two) times daily as needed for anxiety.     LUNESTA 2 MG Tabs tablet  Generic drug:  eszopiclone  Take 2 mg by mouth at bedtime.     NEXIUM 40 MG capsule  Generic drug:  esomeprazole  Take 40 mg by mouth daily.     polyethylene glycol packet  Commonly known as:  MIRALAX / GLYCOLAX  Take 17 g by mouth 2 (two) times daily.     potassium chloride 10 MEQ CR capsule  Commonly known as:  MICRO-K  Take 10 mEq by mouth 2 (two) times daily. AT BREAKFAST AND LUNCH     rivaroxaban 10 MG Tabs tablet  Commonly  known as:  XARELTO  Take 1 tablet (10 mg total) by mouth daily.     tamoxifen 20 MG tablet  Commonly known as:  NOLVADEX  Take 20 mg by mouth daily. TAKES WITH SUPPER     tiZANidine 4 MG tablet  Commonly known as:  ZANAFLEX  Take 1 tablet (4 mg total) by mouth 2 (two) times daily. TAKES WITH SUPPER AND AT BEDTIME EVERY DAY     VIACTIV 500-500-40 MG-UNT-MCG Chew  Generic drug:  Calcium-Vitamin D-Vitamin K  Chew 1 tablet by mouth daily.     vitamin C 500 MG tablet  Commonly known as:  ASCORBIC ACID  Take 1,000 mg by mouth daily.     vitamin E 400 UNIT capsule  Take 400 Units by mouth.         Signed: Anastasio Auerbach. Mel Langan   PAC  04/19/2013, 4:18 PM

## 2013-04-19 NOTE — Progress Notes (Signed)
Patient ID: Selena Bush, female   DOB: 1945-08-31, 67 y.o.   MRN: [id number] Subjective: 3 Days Post-Op Procedure(s) (LRB): RIGHT TOTAL KNEE ARTHROPLASTY (Right)    Patient reports pain as moderate.  Once pain was better controlled yesterday she was able to tolerate activity and therapy better thus she is wanting to go home today  Objective:   VITALS:   Filed Vitals:   04/19/13 0546  BP: 164/84  Pulse: 84  Temp: 99.5 F (37.5 C)  Resp: 16    Neurovascular intact Incision: dressing C/D/I  LABS  Recent Labs  04/17/13 0413 04/18/13 0439  HGB 8.5* 8.6*  HCT 26.9* 26.0*  WBC 9.6 11.9*  PLT 242 230     Recent Labs  04/17/13 0413 04/18/13 0439  NA 130* 132*  K 3.6 3.4*  BUN 10 12  CREATININE 0.74 0.81  GLUCOSE 128* 125*    No results found for this basename: LABPT, INR,  in the last 72 hours   Assessment/Plan: 3 Days Post-Op Procedure(s) (LRB): RIGHT TOTAL KNEE ARTHROPLASTY (Right)   Discharge home with home health today after therapy Prescriptions will be provided  Follow up in 2 weeks

## 2013-04-19 NOTE — Progress Notes (Signed)
CSW met with pt this am to assist with d/c planning. Pt will be d/c home today with [facility]. [facility] has been contacted and updated. She will cancell request for SNF authorization from [facility]. RNCM will assist with d/c planning to home.  Cori Razor LCSW [phone]

## 2013-04-19 NOTE — Progress Notes (Signed)
Physical Therapy Treatment Patient Details Name: Selena Bush MRN: [id number] DOB: 12-11-1945 Today's Date: 04/19/2013 Time: 2952-8413 PT Time Calculation (min): 9 min  PT Assessment / Plan / Recommendation  History of Present Illness s/p R TKA   PT Comments   Patient sitting on bed this am in anxious state needing to toilet and states has yet to get breakfast after ordering some time ago.  Seems anxious at this point to go home, but needs continued skilled assist for safety as keeps walker out and remains flexed.  Spouse in room and supportive.  Needs to practice steps and demonstrate increased safety today prior to d/c.   Follow Up Recommendations  Home health PT;Supervision - Intermittent           Equipment Recommendations  Rolling walker with 5" wheels       Frequency 7X/week   Progress towards PT Goals Progress towards PT goals: Progressing toward goals  Plan Discharge plan needs to be updated    Precautions / Restrictions Precautions Precautions: Knee Restrictions Other Position/Activity Restrictions: WBAT   Pertinent Vitals/Pain Moderate pain with weight bearing    Mobility  Bed Mobility Bed Mobility: Not assessed Details for Bed Mobility Assistance: sitting edge of bed Transfers Sit to Stand: 4: Min assist;With upper extremity assist;From toilet;From bed Stand to Sit: To toilet;To chair/3-in-1;4: Min assist;With armrests Details for Transfer Assistance: verbal cuse for hand placement and LE management Ambulation/Gait Ambulation/Gait Assistance: 5: Supervision;4: Min guard Ambulation Distance (Feet): 17 Feet (x2) Assistive device: Rolling walker Ambulation/Gait Assistance Details: cues for tall posture, increased UE weight for pain relief and for proximity to walker Gait Pattern: Step-to pattern;Antalgic;Trunk flexed;Wide base of support      PT Goals (current goals can now be found in the care plan section)    Visit Information  Last PT Received On:  04/19/13 History of Present Illness: s/p R TKA    Subjective Data   I can't wait anymore.  I need to go to the bathroom!   Cognition  Cognition Arousal/Alertness: Awake/alert Behavior During Therapy: Anxious Overall Cognitive Status: Within Functional Limits for tasks assessed    Balance  Balance Balance Assessed: Yes Static Standing Balance Static Standing - Balance Support: No upper extremity supported Static Standing - Level of Assistance: 5: Stand by assistance (after toileting and performing hygiene)  End of Session PT - End of Session Equipment Utilized During Treatment: Gait belt Activity Tolerance: Patient limited by pain Patient left: in chair;with family/visitor present   GP     [facility] 04/19/2013, 9:40 AM Sheran Lawless, PT [phone] 04/19/2013

## 2013-04-19 NOTE — Progress Notes (Signed)
Occupational Therapy Treatment Patient Details Name: Selena Bush MRN: [id number] DOB: Apr 14, 1946 Today's Date: 04/19/2013 Time: 1030-1100 OT Time Calculation (min): 30 min  OT Assessment / Plan / Recommendation  History of present illness s/p R TKA   OT comments  Pt deciding to d/c home per pt and chart. Recommend HHOT to progress ADL independence and work on safety and provide additional AE education.    Follow Up Recommendations  Home health OT;Supervision/Assistance - 24 hour (pt now deciding to d/c home)    Barriers to Discharge       Equipment Recommendations  3 in 1 bedside comode    Recommendations for Other Services    Frequency Min 2X/week   Progress towards OT Goals    Plan Discharge plan needs to be updated    Precautions / Restrictions Precautions Precautions: Knee Restrictions Weight Bearing Restrictions: No Other Position/Activity Restrictions: WBAT   Pertinent Vitals/Pain 6/10 R knee; nursing made aware, reposition, cold applied    ADL  Toilet Transfer: Minimal assistance Toilet Transfer Method: Other (comment) (with walker to 3in1) Toilet Transfer Equipment: Bedside commode Toileting - Clothing Manipulation and Hygiene: Performed;Supervision/safety Where Assessed - Glass blower/designer Manipulation and Hygiene: Sit on 3-in-1 or toilet Equipment Used: Rolling walker ADL Comments: Pt now planning home instead of SNF. Discussed shower arrangement. She has a tub with a tubseat available but feel pt would be safer to sponge bathe initially and let HHOT assess tub transfer as pt is still have difficulty with sit to stand transitions and with increased pain with activity. Pt and spouse verbalize understanding of recommendation to sponge bathe initially. Discussed AE options for LB dressing versus family assist. Demonstrated AE but feel she would benefit from practice with HHOT if she decides to obtain AE. (pt not sure if she will purchase it yet) She does have some  difficulty with sit to stand and needs min assist to rise and steady and also to control descent to sit. Discussed how to adjust 3in1 to appropriate height and demonstrated for husband. Pt in bathroom on comfort height commode when OT arrived so had pt transfer out of bathroom to 3in1 to practice also. She needs cues for posture as she tends to forward flex and also for staying inside of walker as well as hand placement.    OT Diagnosis:    OT Problem List:   OT Treatment Interventions:     OT Goals(current goals can now be found in the care plan section) Acute Rehab OT Goals Patient Stated Goal: home with husband  Visit Information  Last OT Received On: 04/19/13 Assistance Needed: +1 History of Present Illness: s/p R TKA    Subjective Data      Prior Functioning       Cognition  Cognition Arousal/Alertness: Awake/alert Behavior During Therapy: Anxious Overall Cognitive Status: Within Functional Limits for tasks assessed    Mobility   Transfers Transfers: Sit to Stand;Stand to Sit Sit to Stand: 4: Min assist;With upper extremity assist;From chair/3-in-1;From toilet Stand to Sit: 4: Min assist;With upper extremity assist;To chair/3-in-1 Details for Transfer Assistance: verbal cuse for hand placement and LE management    Exercises      Balance  Dynamic Standing Balance Dynamic Standing - Level of Assistance: 4: Min assist   End of Session OT - End of Session Equipment Utilized During Treatment: Rolling walker Activity Tolerance: Patient limited by pain Patient left: in chair;with call bell/phone within reach;with family/visitor present  GO     Larina Bras, [employer]  [address] [phone] 04/19/2013, 11:20 AM

## 2013-04-19 NOTE — Progress Notes (Signed)
Physical Therapy Treatment Patient Details Name: Selena Bush MRN: [id number] DOB: 03/11/1946 Today's Date: 04/19/2013 Time: 1205-1230 PT Time Calculation (min): 25 min  PT Assessment / Plan / Recommendation  History of Present Illness s/p R TKA   PT Comments   Patient tearful after negotiating one step due to pain on right with weight bearing.  Educated spouse how to assist pt up with wheelchair.  Also educated on TKA exercises (did not perform as ready for d/c,) and on car transfers.  Will need 24 hour assist from family and HHPT.  Follow Up Recommendations  Home health PT;Supervision/Assistance - 24 hour           Equipment Recommendations  Rolling walker with 5" wheels       Frequency 7X/week   Progress towards PT Goals Progress towards PT goals: Progressing toward goals  Plan Current plan remains appropriate    Precautions / Restrictions Precautions Precautions: Knee Restrictions Weight Bearing Restrictions: No Other Position/Activity Restrictions: WBAT   Pertinent Vitals/Pain C/o severe pain and tearful with stair negotiation    Mobility  Bed Mobility Bed Mobility: Not assessed Details for Bed Mobility Assistance: up in chair Transfers Sit to Stand: 4: Min assist;With upper extremity assist;From chair/3-in-1 Stand to Sit: 4: Min guard;To chair/3-in-1 Details for Transfer Assistance: sits with uncontrolled descent due to pain in knee Ambulation/Gait Ambulation/Gait Assistance: 4: Min guard;5: Supervision Ambulation Distance (Feet): 40 Feet Assistive device: Rolling walker Ambulation/Gait Assistance Details: lowered walker for height and cues to keep walker out to avoid posterior loss of balance Gait Pattern: Step-to pattern;Trunk flexed;Antalgic Stairs: Yes Stairs Assistance: 4: Min assist Stairs Assistance Details (indicate cue type and reason): cues for sequence, stopped after one step due to pain (demo reverse technique with walker, pt and spouse report son  can get wheelchair and assist to get pt in with chair, demo how to bump w/c up steps and gave handout with instructions) Stair Management Technique: One rail Right;Step to pattern;Sideways Number of Stairs: 1      PT Goals (current goals can now be found in the care plan section) Acute Rehab PT Goals Patient Stated Goal: home with husband  Visit Information  Last PT Received On: 04/19/13 Assistance Needed: +1 History of Present Illness: s/p R TKA    Subjective Data  Patient Stated Goal: home with husband   Cognition  Cognition Arousal/Alertness: Awake/alert Behavior During Therapy: Anxious Overall Cognitive Status: Within Functional Limits for tasks assessed    Balance  Balance Balance Assessed: Yes Static Standing Balance Static Standing - Balance Support: No upper extremity supported Static Standing - Level of Assistance: 5: Stand by assistance (after toileting and performing hygiene) Dynamic Standing Balance Dynamic Standing - Level of Assistance: 4: Min assist  End of Session PT - End of Session Equipment Utilized During Treatment: Gait belt Activity Tolerance: Patient limited by pain Patient left: in chair;with family/visitor present   GP     [facility] 04/19/2013, 12:40 PM Sheran Lawless, PT [phone] 04/19/2013

## 2013-04-23 ENCOUNTER — Telehealth: Payer: Self-pay | Admitting: *Deleted

## 2013-04-23 NOTE — Telephone Encounter (Signed)
She had called and had questions about Tamoxifen and interactions with Cymbalta. I left a message.

## 2013-04-24 ENCOUNTER — Telehealth: Payer: Self-pay | Admitting: *Deleted

## 2013-04-24 ENCOUNTER — Other Ambulatory Visit: Payer: Self-pay | Admitting: General Surgery

## 2013-04-24 NOTE — Telephone Encounter (Signed)
Cymbalta may increase metabolism of Tamoxifen

## 2013-04-24 NOTE — Telephone Encounter (Signed)
Two observational studies presented at a recent meeting of the American Society of Clinical Oncology (45th annual meeting, May 29-January 15, 2008, [place], [place] abstracts [id number], [id number]) examined the effect of strong inhibitors of CYP2D6 on the success rate of tamoxifen in preventing recurrence of breast cancer. One found that women who took fluoxetine, paroxetine or sertraline (or bupropion, duloxetine, terbinafine, quinidine or long-term diphenhydramine) with tamoxifen had a higher 2-year recurrence rate (13.9% vs. 7.5%). The other study found no association between cancer recurrence and use of a CYP2D6 inhibitor. There is no good evidence that any one SSRI is more effective than any other for treatment of depression. For women who are taking tamoxifen and need to begin treatment with an SSRI to treat depression, citalopram or escitalopram might be the safest choice.   The patient will contact Dr. Elease Hashimoto to discuss changing her Cymbalta to a SSRI that does not have such profound effects on Tamoxifen metabolism.

## 2013-04-24 NOTE — Telephone Encounter (Signed)
Phone call discussing medications. Tamoxifen alert with Cymbalta. Dr Lemar Livings aware. She states she will call Dr Elease Hashimoto and see about changing her Cymbalta to possibly Prozac, she will let us know decisions.  She is aware to stay on the tamoxifen.

## 2013-06-03 ENCOUNTER — Ambulatory Visit: Payer: BC Managed Care – PPO | Admitting: General Surgery

## 2013-06-13 ENCOUNTER — Encounter: Payer: Self-pay | Admitting: General Surgery

## 2013-06-13 ENCOUNTER — Ambulatory Visit: Payer: Self-pay | Admitting: General Surgery

## 2013-06-17 ENCOUNTER — Ambulatory Visit: Payer: Self-pay | Admitting: Radiation Oncology

## 2013-06-25 ENCOUNTER — Ambulatory Visit (INDEPENDENT_AMBULATORY_CARE_PROVIDER_SITE_OTHER): Payer: BC Managed Care – PPO | Admitting: General Surgery

## 2013-06-25 ENCOUNTER — Encounter: Payer: Self-pay | Admitting: General Surgery

## 2013-06-25 VITALS — BP 134/72 | HR 78 | Resp 16 | Ht 60.0 in | Wt 237.0 lb

## 2013-06-25 DIAGNOSIS — D0590 Unspecified type of carcinoma in situ of unspecified breast: Secondary | ICD-10-CM

## 2013-06-25 DIAGNOSIS — D0591 Unspecified type of carcinoma in situ of right breast: Secondary | ICD-10-CM

## 2013-06-25 NOTE — Progress Notes (Signed)
Patient ID: Selena Bush, female   DOB: 1946-04-15, 67 y.o.   MRN: [id number]  Chief Complaint  Patient presents with  . Follow-up    mammogram    HPI Selena Bush is a 67 y.o. female who presents for a breast evaluation. The most recent right breast  mammogram was done on 06/13/13. The patient is now 6 months out from wide excision and sentinel node biopsy followed by partial breast radiation. She reports that she was experiencing significant tenderness in the breast precluding the use of a broad for several months after radiation was completed, but this is improving. Patient does perform regular self breast checks and gets regular mammograms done.    HPI  Past Medical History  Diagnosis Date  . Hypertension   . Diffuse cystic mastopathy 2014  . Anxiety   . Depression   . Pain     LUMBAR PAIN -DDD  . GERD (gastroesophageal reflux disease)   . Mitral insufficiency   . Arthritis     OA BOTH KNEES  . Hypercholesterolemia   . Sleep difficulties     LUNESTA HAS HELPED  . Cancer 10/2012    PARTIAL RIGHT MASTECTOMY FOR BREAST CANCER--HAD RADIATION - NO CHEMO --Selena Bush  . Sleep apnea     USES CPAP    Past Surgical History  Procedure Laterality Date  . Colonoscopy  2011     Selena Bush  . Ercp  1995  . Breast surgery Right 2014  . Mastectomy Right 10/19/2012    PARTIAL MASTECTOMY  . Cholecystectomy  1994  . Tubal ligation  1979  . Abdominal hysterectomy  1992  . Total knee arthroplasty Right 04/16/2013    Procedure: RIGHT TOTAL KNEE ARTHROPLASTY;  Surgeon: Selena Bush;  Location: [facility];  Service: Orthopedics;  Laterality: Right;    Family History  Problem Relation Age of Onset  . Colon cancer Maternal Grandfather   . Breast cancer Maternal Grandfather   . Colon cancer Mother     Social History History  Substance Use Topics  . Smoking status: Former Smoker -- 5.00 packs/day for 5 years    Types: Cigarettes  . Smokeless tobacco: Never Used   . Alcohol Use: No     Comment: ONLY SMOKED AS A TEENAGER    Allergies  Allergen Reactions  . Codeine     Gi problems   . Penicillins     anaphylaxis    . Statins Other (See Comments)    Leg pains  . Sulfa Antibiotics     anaphylaxis     Current Outpatient Prescriptions  Medication Sig Dispense Refill  . atenolol (TENORMIN) 50 MG tablet Take 50 mg by mouth every morning.       . beta carotene 40981 UNIT capsule Take 25,000 Units by mouth daily.      . busPIRone (BUSPAR) 15 MG tablet Take 30 mg by mouth at bedtime.       . Calcium-Vitamin D-Vitamin K (VIACTIV) 500-500-40 MG-UNT-MCG CHEW Chew 1 tablet by mouth daily.      . fish oil-omega-3 fatty acids 1000 MG capsule Take 1 g by mouth daily.      . fluticasone (FLONASE) 50 MCG/ACT nasal spray Place 1 spray into the nose daily as needed for rhinitis or allergies.       . furosemide (LASIX) 40 MG tablet Take 40 mg by mouth 2 (two) times daily.       . Glucos-Chondroit-Hyaluron-MSM (GLUCOSAMINE CHONDROITIN JOINT PO) Take  by mouth 2 (two) times daily.      [name] HYDROmorphone (DILAUDID) 2 MG tablet Take 1-2 tablets (2-4 mg total) by mouth every 4 (four) hours.  60 tablet  0  . lisinopril (PRINIVIL,ZESTRIL) 20 MG tablet Take 20 mg by mouth daily with breakfast.       . LORazepam (ATIVAN) 0.5 MG tablet Take 1 tablet (0.5 mg total) by mouth 2 (two) times daily as needed for anxiety.  30 tablet  0  . LUNESTA 2 MG TABS Take 2 mg by mouth at bedtime.      [name] NEXIUM 40 MG capsule Take 40 mg by mouth daily.      [name] PENNSAID 1.5 % SOLN Take 1 tablet by mouth daily.      . potassium chloride (MICRO-K) 10 MEQ CR capsule Take 10 mEq by mouth 2 (two) times daily. AT BREAKFAST AND LUNCH      . rivaroxaban (XARELTO) 10 MG TABS tablet Take 1 tablet (10 mg total) by mouth daily.  14 tablet  0  . tamoxifen (NOLVADEX) 20 MG tablet Take 1 tablet by mouth daily.      [name] tiZANidine (ZANAFLEX) 4 MG tablet Take 1 tablet (4 mg total) by mouth 2 (two) times daily.  TAKES WITH SUPPER AND AT BEDTIME EVERY DAY  30 tablet  0  . vitamin C (ASCORBIC ACID) 500 MG tablet Take 1,000 mg by mouth daily.      . vitamin E 400 UNIT capsule Take 400 Units by mouth.       No current facility-administered medications for this visit.    Review of Systems Review of Systems  Constitutional: Negative.   Respiratory: Negative.   Cardiovascular: Negative.     Blood pressure 134/72, pulse 78, resp. rate 16, height 5' (1.524 m), weight 237 lb (107.502 kg).  Physical Exam Physical Exam  Constitutional: She is oriented to person, place, and time. She appears well-developed and well-nourished.  Eyes: No scleral icterus.  Neck:  Left neck above the clavicle  6 cm lipoma.  Cardiovascular: Normal rate, regular rhythm and normal heart sounds.   Pulmonary/Chest: Breath sounds normal. Right breast exhibits no inverted nipple, no mass, no nipple discharge, no skin change and no tenderness. Left breast exhibits no inverted nipple, no mass, no nipple discharge, no skin change and no tenderness.  Right breast well healed incision.   Abdominal: Bowel sounds are normal.  Lymphadenopathy:    She has no cervical adenopathy.    She has no axillary adenopathy.  Neurological: She is alert and oriented to person, place, and time.  Skin: Skin is warm and dry.    Data Reviewed Right breast mammogram dated 06/13/2013 was reviewed. BI-RAD-2.  Assessment    Doing well status post treatment of right breast DCIS.    Plan    The patient has been changed from her cytochrome P450 stimulating antidepressant by her primary care provider. She is tolerating her tamoxifen without ill effect.   We'll plan to arrange for bilateral diagnostic mammograms in March 2015. This will be a little leg the left breast but allow for annual exams take place tied to her breast cancer treatment.         Selena Bush 06/25/2013, 11:48 AM

## 2013-06-25 NOTE — Patient Instructions (Signed)
Patient to return in March 2015 bilateral diagnotic mammogram.

## 2013-07-15 ENCOUNTER — Encounter: Payer: Self-pay | Admitting: General Surgery

## 2013-07-15 ENCOUNTER — Ambulatory Visit: Payer: Self-pay | Admitting: Radiation Oncology

## 2013-07-16 ENCOUNTER — Encounter: Payer: Self-pay | Admitting: General Surgery

## 2013-08-15 DIAGNOSIS — Z923 Personal history of irradiation: Secondary | ICD-10-CM

## 2013-08-15 HISTORY — PX: BREAST LUMPECTOMY: SHX2

## 2013-08-15 HISTORY — DX: Personal history of irradiation: Z92.3

## 2013-08-15 HISTORY — PX: COLONOSCOPY: SHX174

## 2013-10-29 ENCOUNTER — Ambulatory Visit: Payer: Self-pay | Admitting: General Surgery

## 2013-10-30 ENCOUNTER — Encounter: Payer: Self-pay | Admitting: General Surgery

## 2013-11-06 ENCOUNTER — Encounter: Payer: Self-pay | Admitting: General Surgery

## 2013-11-06 ENCOUNTER — Ambulatory Visit (INDEPENDENT_AMBULATORY_CARE_PROVIDER_SITE_OTHER): Payer: BC Managed Care – PPO | Admitting: General Surgery

## 2013-11-06 VITALS — BP 158/78 | Ht 60.0 in | Wt 249.0 lb

## 2013-11-06 DIAGNOSIS — D0590 Unspecified type of carcinoma in situ of unspecified breast: Secondary | ICD-10-CM

## 2013-11-06 DIAGNOSIS — D0510 Intraductal carcinoma in situ of unspecified breast: Secondary | ICD-10-CM

## 2013-11-06 NOTE — Progress Notes (Signed)
Patient ID: Selena Bush, female   DOB: [birth year], 68 y.o.   MRN: [id number]  Chief Complaint  Patient presents with  . Follow-up    mammogram    HPI Selena Bush is a 68 y.o. female who presents for a breast evaluation. The most recent mammogram was done on 10/29/13, BI-RAD-2.Patient does perform regular self breast checks and gets regular mammograms done.  No new breast complaints. Tolerating tamoxifen well. She is recovering from right knee replacement [place] in Fall 2014 [place]  HPI  Past Medical History  Diagnosis Date  . Hypertension   . Diffuse cystic mastopathy 2014  . Anxiety   . Depression   . Pain     LUMBAR PAIN -DDD  . GERD (gastroesophageal reflux disease)   . Mitral insufficiency   . Arthritis     OA BOTH KNEES  . Hypercholesterolemia   . Sleep difficulties     LUNESTA HAS HELPED  . Sleep apnea     USES CPAP  . 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 PARTIAL RIGHT MASTECTOMY FOR BREAST CANCER--HAD RADIATION - NO CHEMO --DR. CRYSTAL [facility] ONCOLOGIST    Past Surgical History  Procedure Laterality Date  . Colonoscopy  2011     Dr. Candace Cruise  . Ercp  1995  . Mastectomy Right 10/19/2012    PARTIAL MASTECTOMY  . Cholecystectomy  1994  . Tubal ligation  1979  . Abdominal hysterectomy  1992  . Total knee arthroplasty Right 04/16/2013    Procedure: RIGHT TOTAL KNEE ARTHROPLASTY;  Surgeon: Mauri Pole, MD;  Location: [facility];  Service: Orthopedics;  Laterality: Right;  . Breast surgery Right March 2014    10 mm papillary DCIS, ER/PR positive. Sentinel node negative. Partial breast radiation.  . Joint replacement Right Sept 2014    knee    Family History  Problem Relation Age of Onset  . Colon cancer Maternal Grandfather   . Breast cancer Maternal Grandfather   . Colon cancer Mother     Social History History  Substance Use Topics  . Smoking  status: Former Smoker -- 5.00 packs/day for 5 years    Types: Cigarettes  . Smokeless tobacco: Never Used  . Alcohol Use: No     Comment: ONLY SMOKED AS A TEENAGER    Allergies  Allergen Reactions  . Codeine     Gi problems   . Penicillins     anaphylaxis    . Statins Other (See Comments)    Leg pains  . Sulfa Antibiotics     anaphylaxis     Current Outpatient Prescriptions  Medication Sig Dispense Refill  . atenolol (TENORMIN) 50 MG tablet Take 50 mg by mouth every morning.       . beta carotene 25000 UNIT capsule Take 25,000 Units by mouth daily.      . busPIRone (BUSPAR) 15 MG tablet Take 30 mg by mouth at bedtime.       . Calcium-Vitamin D-Vitamin K (VIACTIV) 259-563-87 MG-UNT-MCG CHEW Chew 1 tablet by mouth daily.      . fish oil-omega-3 fatty acids 1000 MG capsule Take 1 g by mouth daily.      . fluticasone (FLONASE) 50 MCG/ACT nasal spray Place 1 spray into the nose daily as needed for rhinitis or allergies.       . furosemide (LASIX) 40 MG tablet Take 40 mg by mouth 2 (two) times daily.       [name]  Glucos-Chondroit-Hyaluron-MSM (GLUCOSAMINE CHONDROITIN JOINT PO) Take by mouth 2 (two) times daily.      [name] ibuprofen (ADVIL,MOTRIN) 200 MG tablet Take 400 mg by mouth 2 (two) times daily.       [name] lisinopril (PRINIVIL,ZESTRIL) 20 MG tablet Take 20 mg by mouth daily with breakfast.       . LORazepam (ATIVAN) 0.5 MG tablet Take 1 tablet (0.5 mg total) by mouth 2 (two) times daily as needed for anxiety.  30 tablet  0  . LUNESTA 2 MG TABS Take 2 mg by mouth at bedtime.      [name] NEXIUM 40 MG capsule Take 40 mg by mouth daily.      [name] PENNSAID 1.5 % SOLN Take 1 tablet by mouth daily.      . potassium chloride (MICRO-K) 10 MEQ CR capsule Take 10 mEq by mouth 2 (two) times daily. AT BREAKFAST AND LUNCH      . tamoxifen (NOLVADEX) 20 MG tablet Take 1 tablet by mouth daily.      [name] venlafaxine XR (EFFEXOR-XR) 150 MG 24 hr capsule Take 150 mg by mouth daily with breakfast.      . vitamin C  (ASCORBIC ACID) 500 MG tablet Take 1,000 mg by mouth daily.      . vitamin E 400 UNIT capsule Take 400 Units by mouth.       No current facility-administered medications for this visit.    Review of Systems Review of Systems  Constitutional: Negative.   Respiratory: Negative.   Cardiovascular: Negative.     Blood pressure 158/78, height 5' (1.524 m), weight 249 lb (112.946 kg).  Physical Exam Physical Exam  Constitutional: She is oriented to person, place, and time. She appears well-developed and well-nourished.  Neck: Neck supple.  Cardiovascular: Normal rate, regular rhythm and normal heart sounds.   Pulmonary/Chest: Effort normal and breath sounds normal. Right breast exhibits no inverted nipple, no mass, no nipple discharge, no skin change and no tenderness. Left breast exhibits no inverted nipple, no mass, no nipple discharge, no skin change and no tenderness.    right breast 3 cm focal thickening above incision.  Lymphadenopathy:    She has no cervical adenopathy.    She has no axillary adenopathy.  Neurological: She is alert and oriented to person, place, and time.  Skin: Skin is warm and dry.    Data Reviewed October 29, 2013 mammogram was reviewed. There is increased density corresponding near of palpable thickening consistent with previous surgery and radiation. BI-RAD-2.  Assessment    Tolerating tamoxifen well for management of her right breast DCIS.     Plan       Follow up in one year with bilateral diagnostic mammograms and office visit.        Robert Bellow 11/06/2013, 8:15 PM

## 2013-11-06 NOTE — Patient Instructions (Addendum)
Continue self breast exams. Call office for any new breast issues or concerns. Follow up in one year with bilateral diagnostic mammograms and office visit.

## 2013-11-11 ENCOUNTER — Encounter: Payer: Self-pay | Admitting: *Deleted

## 2013-12-28 ENCOUNTER — Other Ambulatory Visit: Payer: Self-pay | Admitting: General Surgery

## 2013-12-28 DIAGNOSIS — D0510 Intraductal carcinoma in situ of unspecified breast: Secondary | ICD-10-CM

## 2014-06-16 ENCOUNTER — Encounter: Payer: Self-pay | Admitting: General Surgery

## 2014-06-17 ENCOUNTER — Ambulatory Visit: Payer: Self-pay | Admitting: Radiation Oncology

## 2014-07-15 ENCOUNTER — Ambulatory Visit: Payer: Self-pay | Admitting: Radiation Oncology

## 2014-07-24 ENCOUNTER — Ambulatory Visit: Payer: Self-pay | Admitting: Gastroenterology

## 2014-07-24 LAB — HM COLONOSCOPY

## 2014-08-06 ENCOUNTER — Encounter (HOSPITAL_COMMUNITY)
Admission: RE | Admit: 2014-08-06 | Discharge: 2014-08-06 | Disposition: A | Discharge status: Home / Self Care | Payer: Medicare Other | Source: Ambulatory Visit | Attending: Orthopedic Surgery | Admitting: Orthopedic Surgery

## 2014-08-06 ENCOUNTER — Ambulatory Visit (HOSPITAL_COMMUNITY)
Admission: RE | Admit: 2014-08-06 | Discharge: 2014-08-06 | Disposition: A | Discharge status: Home / Self Care | Payer: Medicare Other | Source: Ambulatory Visit | Attending: Anesthesiology | Admitting: Anesthesiology

## 2014-08-06 ENCOUNTER — Encounter (HOSPITAL_COMMUNITY): Payer: Self-pay

## 2014-08-06 DIAGNOSIS — J9811 Atelectasis: Secondary | ICD-10-CM | POA: Diagnosis not present

## 2014-08-06 DIAGNOSIS — Z01810 Encounter for preprocedural cardiovascular examination: Secondary | ICD-10-CM | POA: Diagnosis present

## 2014-08-06 DIAGNOSIS — Z96652 Presence of left artificial knee joint: Secondary | ICD-10-CM | POA: Diagnosis not present

## 2014-08-06 DIAGNOSIS — I10 Essential (primary) hypertension: Secondary | ICD-10-CM | POA: Diagnosis not present

## 2014-08-06 HISTORY — DX: Headache: R51

## 2014-08-06 HISTORY — DX: Headache, unspecified: R51.9

## 2014-08-06 LAB — BASIC METABOLIC PANEL
Anion gap: 7 (ref 5–15)
BUN: 20 mg/dL (ref 6–23)
CALCIUM: 9.6 mg/dL (ref 8.4–10.5)
CO2: 29 mmol/L (ref 19–32)
Chloride: 104 mEq/L (ref 96–112)
Creatinine, Ser: 1.12 mg/dL — ABNORMAL HIGH (ref 0.50–1.10)
GFR calc Af Amer: 58 mL/min — ABNORMAL LOW (ref >90)
GFR calc non Af Amer: 50 mL/min — ABNORMAL LOW (ref >90)
GLUCOSE: 106 mg/dL — AB (ref 70–99)
Potassium: 5.2 mmol/L — ABNORMAL HIGH (ref 3.5–5.1)
Sodium: 140 mmol/L (ref 135–145)

## 2014-08-06 LAB — APTT: aPTT: 23 seconds — ABNORMAL LOW (ref 24–37)

## 2014-08-06 LAB — PROTIME-INR
INR: 0.97 (ref 0.00–1.49)
Prothrombin Time: 13 seconds (ref 11.6–15.2)

## 2014-08-06 LAB — URINALYSIS, ROUTINE W REFLEX MICROSCOPIC
Bilirubin Urine: NEGATIVE
GLUCOSE, UA: NEGATIVE mg/dL
Hgb urine dipstick: NEGATIVE
KETONES UR: NEGATIVE mg/dL
Leukocytes, UA: NEGATIVE
Nitrite: NEGATIVE
PROTEIN: NEGATIVE mg/dL
Specific Gravity, Urine: 1.01 (ref 1.005–1.030)
Urobilinogen, UA: 0.2 mg/dL (ref 0.0–1.0)
pH: 5 (ref 5.0–8.0)

## 2014-08-06 LAB — CBC
HCT: 39.3 % (ref 36.0–46.0)
Hemoglobin: 12.5 g/dL (ref 12.0–15.0)
MCH: 28 pg (ref 26.0–34.0)
MCHC: 31.8 g/dL (ref 30.0–36.0)
MCV: 87.9 fL (ref 78.0–100.0)
Platelets: 245 10*3/uL (ref 150–400)
RBC: 4.47 MIL/uL (ref 3.87–5.11)
RDW: 13.8 % (ref 11.5–15.5)
WBC: 8.1 10*3/uL (ref 4.0–10.5)

## 2014-08-06 LAB — SURGICAL PCR SCREEN
MRSA, PCR: NEGATIVE
Staphylococcus aureus: NEGATIVE

## 2014-08-06 NOTE — Patient Instructions (Addendum)
[name]  08/06/2014   Your procedure is scheduled on: Tuesday 08/19/14   Report to [facility]  Entrance and follow signs to               [place] at 11:20 AM.  Call this number if you have problems the morning of surgery [phone]   Remember:  Do not eat food After Midnight. Clear liquids from midnight until 0820am on 08/19/14 then nothing.      Take these medicines the morning of surgery with A SIP OF WATER: atenolol, flonase, nexium, venlafaxine, lorazepam                               You may not have any metal on your body including hair pins and              piercings  Do not wear jewelry, make-up, lotions, powders or perfumes.             Do not wear nail polish.  Do not shave  48 hours prior to surgery.              Men may shave face and neck.  Do not bring valuables to the hospital. [place].  Contacts, dentures or bridgework may not be worn into surgery.  Leave suitcase in the car. After surgery it may be brought to your room.               Please read over the following fact sheets you were given: MRSA fact sheet _____________________________________________________________________             [facility] - Preparing for Surgery Before surgery, you can play an important role.  Because skin is not sterile, your skin needs to be as free of germs as possible.  You can reduce the number of germs on your skin by washing with CHG (chlorahexidine gluconate) soap before surgery.  CHG is an antiseptic cleaner which kills germs and bonds with the skin to continue killing germs even after washing. Please DO NOT use if you have an allergy to CHG or antibacterial soaps.  If your skin becomes reddened/irritated stop using the CHG and inform your nurse when you arrive at Short Stay. Do not shave (including legs and underarms) for at least 48 hours prior to the first CHG shower.  You may shave your  face/neck. Please follow these instructions carefully:  1.  Shower with CHG Soap the night before surgery and the  morning of Surgery.  2.  If you choose to wash your hair, wash your hair first as usual with your  normal  shampoo.  3.  After you shampoo, rinse your hair and body thoroughly to remove the  shampoo.                            4.  Use CHG as you would any other liquid soap.  You can apply chg directly  to the skin and wash                       Gently with a scrungie or clean washcloth.  5.  Apply the CHG Soap  to your body ONLY FROM THE NECK DOWN.   Do not use on face/ open                           Wound or open sores. Avoid contact with eyes, ears mouth and genitals (private parts).                       Wash face,  Genitals (private parts) with your normal soap.             6.  Wash thoroughly, paying special attention to the area where your surgery  will be performed.  7.  Thoroughly rinse your body with warm water from the neck down.  8.  DO NOT shower/wash with your normal soap after using and rinsing off  the CHG Soap.                9.  Pat yourself dry with a clean towel.            10.  Wear clean pajamas.            11.  Place clean sheets on your bed the night of your first shower and do not  sleep with pets. Day of Surgery : Do not apply any lotions/deodorants the morning of surgery.  Please wear clean clothes to the hospital/surgery center.  FAILURE TO FOLLOW THESE INSTRUCTIONS MAY RESULT IN THE CANCELLATION OF YOUR SURGERY PATIENT SIGNATURE_________________________________  NURSE SIGNATURE__________________________________  ________________________________________________________________________  WHAT IS A BLOOD TRANSFUSION? Blood Transfusion Information  A transfusion is the replacement of blood or some of its parts. Blood is made up of multiple cells which provide different functions.  Red blood cells carry oxygen and are used for blood loss  replacement.  White blood cells fight against infection.  Platelets control bleeding.  Plasma helps clot blood.  Other blood products are available for specialized needs, such as hemophilia or other clotting disorders. BEFORE THE TRANSFUSION  Who gives blood for transfusions?   Healthy volunteers who are fully evaluated to make sure their blood is safe. This is blood bank blood. Transfusion therapy is the safest it has ever been in the practice of medicine. Before blood is taken from a donor, a complete history is taken to make sure that person has no history of diseases nor engages in risky social behavior (examples are intravenous drug use or sexual activity with multiple partners). The donor's travel history is screened to minimize risk of transmitting infections, such as malaria. The donated blood is tested for signs of infectious diseases, such as HIV and hepatitis. The blood is then tested to be sure it is compatible with you in order to minimize the chance of a transfusion reaction. If you or a relative donates blood, this is often done in anticipation of surgery and is not appropriate for emergency situations. It takes many days to process the donated blood. RISKS AND COMPLICATIONS Although transfusion therapy is very safe and saves many lives, the main dangers of transfusion include:  1. Getting an infectious disease. 2. Developing a transfusion reaction. This is an allergic reaction to something in the blood you were given. Every precaution is taken to prevent this. The decision to have a blood transfusion has been considered carefully by your caregiver before blood is given. Blood is not given unless the benefits outweigh the risks. AFTER THE TRANSFUSION  Right after receiving a blood transfusion, you will  usually feel much better and more energetic. This is especially true if your red blood cells have gotten low (anemic). The transfusion raises the level of the red blood cells which  carry oxygen, and this usually causes an energy increase.  The nurse administering the transfusion will monitor you carefully for complications. HOME CARE INSTRUCTIONS  No special instructions are needed after a transfusion. You may find your energy is better. Speak with your caregiver about any limitations on activity for underlying diseases you may have. SEEK MEDICAL CARE IF:   Your condition is not improving after your transfusion.  You develop redness or irritation at the intravenous (IV) site. SEEK IMMEDIATE MEDICAL CARE IF:  Any of the following symptoms occur over the next 12 hours:  Shaking chills.  You have a temperature by mouth above 102 F (38.9 C), not controlled by medicine.  Chest, back, or muscle pain.  People around you feel you are not acting correctly or are confused.  Shortness of breath or difficulty breathing.  Dizziness and fainting.  You get a rash or develop hives.  You have a decrease in urine output.  Your urine turns a dark color or changes to pink, red, or brown. Any of the following symptoms occur over the next 10 days:  You have a temperature by mouth above 102 F (38.9 C), not controlled by medicine.  Shortness of breath.  Weakness after normal activity.  The white part of the eye turns yellow (jaundice).  You have a decrease in the amount of urine or are urinating less often.  Your urine turns a dark color or changes to pink, red, or brown. Document Released: 07/29/2000 Document Revised: 10/24/2011 Document Reviewed: 03/17/2008 ExitCare Patient Information 2014 [place].  _______________________________________________________________________  Incentive Spirometer  An incentive spirometer is a tool that can help keep your lungs clear and active. This tool measures how well you are filling your lungs with each breath. Taking long deep breaths may help reverse or decrease the chance of developing breathing (pulmonary) problems  (especially infection) following:  A long period of time when you are unable to move or be active. BEFORE THE PROCEDURE   If the spirometer includes an indicator to show your best effort, your nurse or respiratory therapist will set it to a desired goal.  If possible, sit up straight or lean slightly forward. Try not to slouch.  Hold the incentive spirometer in an upright position. INSTRUCTIONS FOR USE  3. Sit on the edge of your bed if possible, or sit up as far as you can in bed or on a chair. 4. Hold the incentive spirometer in an upright position. 5. Breathe out normally. 6. Place the mouthpiece in your mouth and seal your lips tightly around it. 7. Breathe in slowly and as deeply as possible, raising the piston or the ball toward the top of the column. 8. Hold your breath for 3-5 seconds or for as long as possible. Allow the piston or ball to fall to the bottom of the column. 9. Remove the mouthpiece from your mouth and breathe out normally. 10. Rest for a few seconds and repeat Steps 1 through 7 at least 10 times every 1-2 hours when you are awake. Take your time and take a few normal breaths between deep breaths. 11. The spirometer may include an indicator to show your best effort. Use the indicator as a goal to work toward during each repetition. 12. After each set of 10 deep breaths, practice coughing  to be sure your lungs are clear. If you have an incision (the cut made at the time of surgery), support your incision when coughing by placing a pillow or rolled up towels firmly against it. Once you are able to get out of bed, walk around indoors and cough well. You may stop using the incentive spirometer when instructed by your caregiver.  RISKS AND COMPLICATIONS  Take your time so you do not get dizzy or light-headed.  If you are in pain, you may need to take or ask for pain medication before doing incentive spirometry. It is harder to take a deep breath if you are having  pain. AFTER USE  Rest and breathe slowly and easily.  It can be helpful to keep track of a log of your progress. Your caregiver can provide you with a simple table to help with this. If you are using the spirometer at home, follow these instructions: [place] IF:   You are having difficultly using the spirometer.  You have trouble using the spirometer as often as instructed.  Your pain medication is not giving enough relief while using the spirometer.  You develop fever of 100.5 F (38.1 C) or higher. SEEK IMMEDIATE MEDICAL CARE IF:   You cough up bloody sputum that had not been present before.  You develop fever of 102 F (38.9 C) or greater.  You develop worsening pain at or near the incision site. MAKE SURE YOU:   Understand these instructions.  Will watch your condition.  Will get help right away if you are not doing well or get worse. Document Released: 12/12/2006 Document Revised: 10/24/2011 Document Reviewed: 02/12/2007 ExitCare Patient Information 2014 ExitCare, [place].   ________________________________________________________________________    CLEAR LIQUID DIET   Foods Allowed                                                                     Foods Excluded  Coffee and tea, regular and decaf                             liquids that you cannot  Plain Jell-O in any flavor                                             see through such as: Fruit ices (not with fruit pulp)                                     milk, soups, orange juice  Iced Popsicles                                    All solid food Carbonated beverages, regular and diet                                    Cranberry, grape and apple juices Sports  drinks like Gatorade Lightly seasoned clear broth or consume(fat free) Sugar, honey syrup  Sample Menu Breakfast                                Lunch                                     Supper Cranberry juice                    Beef broth                             Chicken broth Jell-O                                     Grape juice                           Apple juice Coffee or tea                        Jell-O                                      Popsicle                                                Coffee or tea                        Coffee or tea  _____________________________________________________________________

## 2014-08-06 NOTE — Progress Notes (Addendum)
Surgery clearance note Dr. Venia Minks 07/21/14 on chart, cardiac clearance note Dr. Clayborn Bigness 08/05/14 on chart

## 2014-08-11 NOTE — Progress Notes (Signed)
07/21/14-EKG from [facility] on chart.

## 2014-08-17 NOTE — H&P (Signed)
TOTAL KNEE ADMISSION H&P  Patient is being admitted for left total knee arthroplasty.  Subjective:  Chief Complaint:     Left knee primary OA / pain.  HPI: Selena Bush, 69 y.o. female, has a history of pain and functional disability in the left knee due to arthritis and has failed non-surgical conservative treatments for greater than 12 weeks to include corticosteriod injections, viscosupplementation injections, use of assistive devices and activity modification.  Onset of symptoms was gradual, starting 6+ years ago with gradually worsening course since that time. The patient noted prior procedures on the knee to include  arthroplasty on the right knee per Dr. Alvan Dame on 04/16/2013.  Patient currently rates pain in the left knee(s) at 9 out of 10 with activity. Patient has night pain, worsening of pain with activity and weight bearing, pain that interferes with activities of daily living, pain with passive range of motion, crepitus and joint swelling.  Patient has evidence of periarticular osteophytes and joint space narrowing by imaging studies.  There is no active infection.   Risks, benefits and expectations were discussed with the patient.  Risks including but not limited to the risk of anesthesia, blood clots, nerve damage, blood vessel damage, failure of the prosthesis, infection and up to and including death.  Patient understand the risks, benefits and expectations and wishes to proceed with surgery.   PCP: Margarita Rana, MD  D/C Plans:      Home with HHPT  Post-op Meds:       No Rx given  Tranexamic Acid:      To be given - IV    Decadron:      Is to be given  FYI:     Xarelto post-op  Dilaudid post-op  CPAP   PREVIOUS COMPONENTS USED:  DePuy rotating platform posterior stabilized knee system:  Size 3 femur  Size 2.5 tibia  10 mm PS insert  38 patellabutton    Patient Active Problem List   Diagnosis Date Noted  . Expected blood loss anemia 04/17/2013  . Morbid  obesity 04/17/2013  . Hyponatremia 04/17/2013  . S/P right TKA 04/16/2013  . DCIS (ductal carcinoma in situ) 11/05/2012  . Essential hypertension, benign 11/05/2012   Past Medical History  Diagnosis Date  . Hypertension   . Diffuse cystic mastopathy 2014  . Anxiety   . Depression   . Pain     LUMBAR PAIN -DDD  . GERD (gastroesophageal reflux disease)   . Mitral insufficiency   . Hypercholesterolemia   . Sleep difficulties     LUNESTA HAS HELPED  . Sleep apnea     USES CPAP  . 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 PARTIAL RIGHT MASTECTOMY FOR BREAST CANCER--HAD RADIATION - NO CHEMO --DR. Buckley ONCOLOGIST  . Arthritis     OA BOTH KNEES, neck, back  . Headache     rare    Past Surgical History  Procedure Laterality Date  . Colonoscopy  2015     1 benign polyp-every 5 years  . Ercp  1995  . Mastectomy Right 10/19/2012    PARTIAL MASTECTOMY  . Cholecystectomy  1994  . Tubal ligation  1979  . Abdominal hysterectomy  1992  . Total knee arthroplasty Right 04/16/2013    Procedure: RIGHT TOTAL KNEE ARTHROPLASTY;  Surgeon: Mauri Pole, MD;  Location: [facility] ORS;  Service: Orthopedics;  Laterality: Right;  . Breast surgery Right  March 2014    10 mm papillary DCIS, ER/PR positive. Sentinel node negative. Partial breast radiation.  . Joint replacement Right Sept 2014    knee    No prescriptions prior to admission   Allergies  Allergen Reactions  . Codeine     Gi problems   . Penicillins     anaphylaxis    . Statins Other (See Comments)    Leg pains  . Sulfa Antibiotics     anaphylaxis     History  Substance Use Topics  . Smoking status: Former Smoker -- 0 years    Types: Cigarettes  . Smokeless tobacco: Never Used     Comment: social smoker as a teen  . Alcohol Use: No    Family History  Problem Relation Age of Onset  . Colon cancer Maternal Grandfather   . Breast  cancer Maternal Grandfather   . Colon cancer Mother      Review of Systems  Constitutional: Negative.   Eyes: Negative.   Respiratory: Negative.   Cardiovascular: Negative.   Gastrointestinal: Positive for heartburn.  Genitourinary: Negative.   Musculoskeletal: Positive for back pain and joint pain.  Skin: Negative.   Neurological: Positive for headaches.  Endo/Heme/Allergies: Negative.   Psychiatric/Behavioral: Positive for depression. The patient is nervous/anxious.     Objective:  Physical Exam  Constitutional: She is oriented to person, place, and time. She appears well-developed and well-nourished.  HENT:  Head: Normocephalic and atraumatic.  Eyes: Pupils are equal, round, and reactive to light.  Neck: Neck supple. No JVD present. No tracheal deviation present. No thyromegaly present.  Cardiovascular: Normal rate, regular rhythm, normal heart sounds and intact distal pulses.   Respiratory: Effort normal and breath sounds normal. No stridor. No respiratory distress. She has no wheezes.  GI: Soft. There is no tenderness. There is no guarding.  Musculoskeletal:       Left knee: She exhibits decreased range of motion, swelling and bony tenderness. She exhibits no ecchymosis, no deformity, no laceration and no erythema. Tenderness found.  Lymphadenopathy:    She has no cervical adenopathy.  Neurological: She is alert and oriented to person, place, and time.  Skin: Skin is warm and dry.  Psychiatric: She has a normal mood and affect.     Labs:  Estimated body mass index is 48.63 kg/(m^2) as calculated from the following:   Height as of 11/06/13: 5' (1.524 m).   Weight as of 11/06/13: 112.946 kg (249 lb).   Imaging Review Plain radiographs demonstrate severe degenerative joint disease of the left knee(s). The overall alignment is neutral. The bone quality appears to be good for age and reported activity level.  Assessment/Plan:  End stage arthritis, left knee   The  patient history, physical examination, clinical judgment of the provider and imaging studies are consistent with end stage degenerative joint disease of the left knee(s) and total knee arthroplasty is deemed medically necessary. The treatment options including medical management, injection therapy arthroscopy and arthroplasty were discussed at length. The risks and benefits of total knee arthroplasty were presented and reviewed. The risks due to aseptic loosening, infection, stiffness, patella tracking problems, thromboembolic complications and other imponderables were discussed. The patient acknowledged the explanation, agreed to proceed with the plan and consent was signed. Patient is being admitted for inpatient treatment for surgery, pain control, PT, OT, prophylactic antibiotics, VTE prophylaxis, progressive ambulation and ADL's and discharge planning. The patient is planning to be discharged home with home health services.  [place] Nichlos Kunzler   PA-C  08/17/2014, 7:36 PM

## 2014-08-19 ENCOUNTER — Encounter (HOSPITAL_COMMUNITY): Payer: Self-pay | Admitting: *Deleted

## 2014-08-19 ENCOUNTER — Inpatient Hospital Stay (HOSPITAL_COMMUNITY): Payer: Medicare Other | Admitting: Anesthesiology

## 2014-08-19 ENCOUNTER — Encounter (HOSPITAL_COMMUNITY)
Admission: RE | Disposition: A | Discharge status: Home / Self Care | Payer: Self-pay | Source: Ambulatory Visit | Attending: Orthopedic Surgery

## 2014-08-19 ENCOUNTER — Ambulatory Visit (HOSPITAL_COMMUNITY)
Admission: RE | Admit: 2014-08-19 | Discharge: 2014-08-19 | Disposition: A | Discharge status: Home / Self Care | Payer: Medicare Other | Source: Ambulatory Visit | Attending: Orthopedic Surgery | Admitting: Orthopedic Surgery

## 2014-08-19 DIAGNOSIS — Z5309 Procedure and treatment not carried out because of other contraindication: Secondary | ICD-10-CM | POA: Insufficient documentation

## 2014-08-19 DIAGNOSIS — Z853 Personal history of malignant neoplasm of breast: Secondary | ICD-10-CM | POA: Insufficient documentation

## 2014-08-19 DIAGNOSIS — K219 Gastro-esophageal reflux disease without esophagitis: Secondary | ICD-10-CM | POA: Diagnosis not present

## 2014-08-19 DIAGNOSIS — Z882 Allergy status to sulfonamides status: Secondary | ICD-10-CM | POA: Insufficient documentation

## 2014-08-19 DIAGNOSIS — I10 Essential (primary) hypertension: Secondary | ICD-10-CM | POA: Insufficient documentation

## 2014-08-19 DIAGNOSIS — E780 Pure hypercholesterolemia: Secondary | ICD-10-CM | POA: Insufficient documentation

## 2014-08-19 DIAGNOSIS — M1712 Unilateral primary osteoarthritis, left knee: Secondary | ICD-10-CM | POA: Insufficient documentation

## 2014-08-19 DIAGNOSIS — Z87891 Personal history of nicotine dependence: Secondary | ICD-10-CM | POA: Insufficient documentation

## 2014-08-19 DIAGNOSIS — Z888 Allergy status to other drugs, medicaments and biological substances status: Secondary | ICD-10-CM | POA: Diagnosis not present

## 2014-08-19 DIAGNOSIS — M179 Osteoarthritis of knee, unspecified: Secondary | ICD-10-CM | POA: Diagnosis not present

## 2014-08-19 DIAGNOSIS — Z886 Allergy status to analgesic agent status: Secondary | ICD-10-CM | POA: Diagnosis not present

## 2014-08-19 DIAGNOSIS — Z538 Procedure and treatment not carried out for other reasons: Secondary | ICD-10-CM | POA: Diagnosis present

## 2014-08-19 DIAGNOSIS — Z6841 Body Mass Index (BMI) 40.0 and over, adult: Secondary | ICD-10-CM | POA: Insufficient documentation

## 2014-08-19 DIAGNOSIS — G4730 Sleep apnea, unspecified: Secondary | ICD-10-CM | POA: Insufficient documentation

## 2014-08-19 DIAGNOSIS — I340 Nonrheumatic mitral (valve) insufficiency: Secondary | ICD-10-CM | POA: Insufficient documentation

## 2014-08-19 DIAGNOSIS — F418 Other specified anxiety disorders: Secondary | ICD-10-CM | POA: Diagnosis not present

## 2014-08-19 DIAGNOSIS — Z880 Allergy status to penicillin: Secondary | ICD-10-CM | POA: Insufficient documentation

## 2014-08-19 DIAGNOSIS — Z96652 Presence of left artificial knee joint: Secondary | ICD-10-CM

## 2014-08-19 LAB — TYPE AND SCREEN
ABO/RH(D): AB POS
Antibody Screen: NEGATIVE

## 2014-08-19 SURGERY — ARTHROPLASTY, KNEE, TOTAL
Anesthesia: Spinal

## 2014-08-19 MED ORDER — VENLAFAXINE HCL ER 150 MG PO CP24: 150.0000 mg | ORAL_CAPSULE | Freq: Every day | ORAL | Status: DC

## 2014-08-19 MED ORDER — FUROSEMIDE 40 MG PO TABS: 40.0000 mg | ORAL_TABLET | Freq: Every day | ORAL | Status: DC

## 2014-08-19 MED ORDER — ONDANSETRON HCL 4 MG/2ML IJ SOLN: 4.0000 mg | Freq: Four times a day (QID) | INTRAMUSCULAR | Status: DC | PRN

## 2014-08-19 MED ORDER — PROMETHAZINE HCL 25 MG/ML IJ SOLN
INTRAMUSCULAR | Status: AC
  Filled 2014-08-19: qty 1

## 2014-08-19 MED ORDER — MAGNESIUM CITRATE PO SOLN
1.0000 | Freq: Once | ORAL | Status: DC | PRN
Start: 2014-08-19 — End: 2014-08-19

## 2014-08-19 MED ORDER — DEXAMETHASONE SODIUM PHOSPHATE 10 MG/ML IJ SOLN: 10.0000 mg | Freq: Once | INTRAMUSCULAR | Status: DC

## 2014-08-19 MED ORDER — PROPOFOL 10 MG/ML IV BOLUS
INTRAVENOUS | Status: AC
  Filled 2014-08-19: qty 20

## 2014-08-19 MED ORDER — POLYETHYLENE GLYCOL 3350 17 G PO PACK: 17.0000 g | PACK | Freq: Two times a day (BID) | ORAL | Status: DC

## 2014-08-19 MED ORDER — HYDROMORPHONE HCL 2 MG PO TABS: 2.0000 mg | ORAL_TABLET | ORAL | Status: DC

## 2014-08-19 MED ORDER — SODIUM CHLORIDE 0.9 % IV SOLN: INTRAVENOUS | Status: DC

## 2014-08-19 MED ORDER — PANTOPRAZOLE SODIUM 40 MG PO TBEC: 80.0000 mg | DELAYED_RELEASE_TABLET | Freq: Every day | ORAL | Status: DC

## 2014-08-19 MED ORDER — MENTHOL 3 MG MT LOZG: 1.0000 | LOZENGE | OROMUCOSAL | Status: DC | PRN

## 2014-08-19 MED ORDER — ATENOLOL 50 MG PO TABS
50.0000 mg | ORAL_TABLET | Freq: Every morning | ORAL | Status: DC
  Filled 2014-08-19 (×2): qty 1

## 2014-08-19 MED ORDER — SPIRONOLACTONE 25 MG PO TABS
25.0000 mg | ORAL_TABLET | Freq: Every morning | ORAL | Status: DC
  Filled 2014-08-19 (×2): qty 1

## 2014-08-19 MED ORDER — RIVAROXABAN 10 MG PO TABS: 10.0000 mg | ORAL_TABLET | ORAL | Status: DC

## 2014-08-19 MED ORDER — MIDAZOLAM HCL 5 MG/5ML IJ SOLN
INTRAMUSCULAR | Status: DC | PRN
  Administered 2014-08-19: 2 mg via INTRAVENOUS

## 2014-08-19 MED ORDER — METHOCARBAMOL 500 MG PO TABS: 500.0000 mg | ORAL_TABLET | Freq: Four times a day (QID) | ORAL | Status: DC | PRN

## 2014-08-19 MED ORDER — METOCLOPRAMIDE HCL 5 MG/ML IJ SOLN: 5.0000 mg | Freq: Three times a day (TID) | INTRAMUSCULAR | Status: DC | PRN

## 2014-08-19 MED ORDER — LACTATED RINGERS IV SOLN
INTRAVENOUS | Status: DC
  Administered 2014-08-19: 1000 mL via INTRAVENOUS

## 2014-08-19 MED ORDER — DOCUSATE SODIUM 100 MG PO CAPS
100.0000 mg | ORAL_CAPSULE | Freq: Two times a day (BID) | ORAL | Status: DC
Start: 2014-08-19 — End: 2014-08-19

## 2014-08-19 MED ORDER — FENTANYL CITRATE 0.05 MG/ML IJ SOLN
INTRAMUSCULAR | Status: DC | PRN
  Administered 2014-08-19: 50 ug via INTRAVENOUS

## 2014-08-19 MED ORDER — BISACODYL 10 MG RE SUPP: 10.0000 mg | Freq: Every day | RECTAL | Status: DC | PRN

## 2014-08-19 MED ORDER — TAMOXIFEN CITRATE 20 MG PO TABS: 20.0000 mg | ORAL_TABLET | Freq: Every day | ORAL | Status: DC

## 2014-08-19 MED ORDER — MIDAZOLAM HCL 2 MG/2ML IJ SOLN
INTRAMUSCULAR | Status: AC
  Filled 2014-08-19: qty 2

## 2014-08-19 MED ORDER — DIPHENHYDRAMINE HCL 25 MG PO CAPS: 25.0000 mg | ORAL_CAPSULE | Freq: Four times a day (QID) | ORAL | Status: DC | PRN

## 2014-08-19 MED ORDER — METOCLOPRAMIDE HCL 5 MG PO TABS: 5.0000 mg | ORAL_TABLET | Freq: Three times a day (TID) | ORAL | Status: DC | PRN

## 2014-08-19 MED ORDER — ALUM & MAG HYDROXIDE-SIMETH 200-200-20 MG/5ML PO SUSP: 30.0000 mL | ORAL | Status: DC | PRN

## 2014-08-19 MED ORDER — LORAZEPAM 0.5 MG PO TABS: 0.5000 mg | ORAL_TABLET | Freq: Two times a day (BID) | ORAL | Status: DC | PRN

## 2014-08-19 MED ORDER — VANCOMYCIN HCL IN DEXTROSE 1-5 GM/200ML-% IV SOLN: 1000.0000 mg | Freq: Two times a day (BID) | INTRAVENOUS | Status: DC

## 2014-08-19 MED ORDER — TRANEXAMIC ACID 100 MG/ML IV SOLN
1000.0000 mg | Freq: Once | INTRAVENOUS | Status: DC
  Filled 2014-08-19: qty 10

## 2014-08-19 MED ORDER — FLUTICASONE PROPIONATE 50 MCG/ACT NA SUSP: 1.0000 | Freq: Every day | NASAL | Status: DC | PRN

## 2014-08-19 MED ORDER — BUPIVACAINE-EPINEPHRINE (PF) 0.25% -1:200000 IJ SOLN
INTRAMUSCULAR | Status: AC
  Filled 2014-08-19: qty 30

## 2014-08-19 MED ORDER — SUVOREXANT 20 MG PO TABS: 20.0000 mg | ORAL_TABLET | Freq: Every day | ORAL | Status: DC

## 2014-08-19 MED ORDER — PROPOFOL 10 MG/ML IV BOLUS
INTRAVENOUS | Status: DC | PRN
  Administered 2014-08-19: 30 mg via INTRAVENOUS

## 2014-08-19 MED ORDER — HYDROMORPHONE HCL 1 MG/ML IJ SOLN: 0.5000 mg | INTRAMUSCULAR | Status: DC | PRN

## 2014-08-19 MED ORDER — BUSPIRONE HCL 15 MG PO TABS: 30.0000 mg | ORAL_TABLET | Freq: Every day | ORAL | Status: DC

## 2014-08-19 MED ORDER — FERROUS SULFATE 325 (65 FE) MG PO TABS: 325.0000 mg | ORAL_TABLET | Freq: Three times a day (TID) | ORAL | Status: DC

## 2014-08-19 MED ORDER — DEXAMETHASONE SODIUM PHOSPHATE 10 MG/ML IJ SOLN
INTRAMUSCULAR | Status: AC
  Filled 2014-08-19: qty 1

## 2014-08-19 MED ORDER — ONDANSETRON HCL 4 MG PO TABS: 4.0000 mg | ORAL_TABLET | Freq: Four times a day (QID) | ORAL | Status: DC | PRN

## 2014-08-19 MED ORDER — LISINOPRIL 20 MG PO TABS
20.0000 mg | ORAL_TABLET | Freq: Once | ORAL | Status: AC
  Administered 2014-08-19: 20 mg via ORAL
  Filled 2014-08-19: qty 1

## 2014-08-19 MED ORDER — KETOROLAC TROMETHAMINE 30 MG/ML IJ SOLN
INTRAMUSCULAR | Status: AC
  Filled 2014-08-19: qty 1

## 2014-08-19 MED ORDER — VANCOMYCIN HCL 10 G IV SOLR
1500.0000 mg | INTRAVENOUS | Status: AC
  Administered 2014-08-19 (×2): 1500 mg via INTRAVENOUS
  Filled 2014-08-19: qty 1500

## 2014-08-19 MED ORDER — FENTANYL CITRATE 0.05 MG/ML IJ SOLN
INTRAMUSCULAR | Status: AC
  Filled 2014-08-19: qty 2

## 2014-08-19 MED ORDER — METHOCARBAMOL 1000 MG/10ML IJ SOLN: 500.0000 mg | Freq: Four times a day (QID) | INTRAVENOUS | Status: DC | PRN

## 2014-08-19 MED ORDER — PHENOL 1.4 % MT LIQD: 1.0000 | OROMUCOSAL | Status: DC | PRN

## 2014-08-19 MED ORDER — SODIUM CHLORIDE 0.9 % IJ SOLN
INTRAMUSCULAR | Status: AC
  Filled 2014-08-19: qty 50

## 2014-08-19 SURGICAL SUPPLY — 48 items
BAG SPEC THK2 15X12 ZIP CLS (MISCELLANEOUS)
BAG ZIPLOCK 12X15 (MISCELLANEOUS) IMPLANT
BANDAGE ELASTIC 6 VELCRO ST LF (GAUZE/BANDAGES/DRESSINGS) ×3 IMPLANT
BANDAGE ESMARK 6X9 LF (GAUZE/BANDAGES/DRESSINGS) ×2 IMPLANT
BLADE SAW SGTL 13.0X1.19X90.0M (BLADE) ×3 IMPLANT
BNDG CMPR 9X6 STRL LF SNTH (GAUZE/BANDAGES/DRESSINGS) ×1
BNDG ESMARK 6X9 LF (GAUZE/BANDAGES/DRESSINGS) ×2
BOWL SMART MIX CTS (DISPOSABLE) ×3 IMPLANT
CUFF TOURN SGL QUICK 34 (TOURNIQUET CUFF) ×2
CUFF TRNQT CYL 34X4X40X1 (TOURNIQUET CUFF) ×2 IMPLANT
DECANTER SPIKE VIAL GLASS SM (MISCELLANEOUS) ×3 IMPLANT
DRAPE EXTREMITY T 121X128X90 (DRAPE) ×3 IMPLANT
DRAPE POUCH INSTRU U-SHP 10X18 (DRAPES) ×3 IMPLANT
DRAPE U-SHAPE 47X51 STRL (DRAPES) ×3 IMPLANT
DRSG AQUACEL AG ADV 3.5X10 (GAUZE/BANDAGES/DRESSINGS) ×3 IMPLANT
DURAPREP 26ML APPLICATOR (WOUND CARE) ×6 IMPLANT
ELECT REM PT RETURN 9FT ADLT (ELECTROSURGICAL) ×2
ELECTRODE REM PT RTRN 9FT ADLT (ELECTROSURGICAL) ×2 IMPLANT
FACESHIELD WRAPAROUND (MASK) ×10 IMPLANT
FACESHIELD WRAPAROUND OR TEAM (MASK) ×5 IMPLANT
GLOVE BIOGEL PI IND STRL 7.5 (GLOVE) ×2 IMPLANT
GLOVE BIOGEL PI INDICATOR 7.5 (GLOVE) ×1
GLOVE ECLIPSE 8.0 STRL XLNG CF (GLOVE) ×3 IMPLANT
GLOVE ORTHO TXT STRL SZ7.5 (GLOVE) ×6 IMPLANT
GOWN SPEC L3 XXLG W/TWL (GOWN DISPOSABLE) ×3 IMPLANT
GOWN STRL REUS W/TWL LRG LVL3 (GOWN DISPOSABLE) ×3 IMPLANT
HANDPIECE INTERPULSE COAX TIP (DISPOSABLE) ×2
KIT BASIN OR (CUSTOM PROCEDURE TRAY) ×3 IMPLANT
LIQUID BAND (GAUZE/BANDAGES/DRESSINGS) ×3 IMPLANT
MANIFOLD NEPTUNE II (INSTRUMENTS) ×3 IMPLANT
NDL SAFETY ECLIPSE 18X1.5 (NEEDLE) ×2 IMPLANT
NEEDLE HYPO 18GX1.5 SHARP (NEEDLE) ×2
PACK TOTAL JOINT (CUSTOM PROCEDURE TRAY) ×3 IMPLANT
POSITIONER SURGICAL ARM (MISCELLANEOUS) ×3 IMPLANT
SET HNDPC FAN SPRY TIP SCT (DISPOSABLE) ×2 IMPLANT
SET PAD KNEE POSITIONER (MISCELLANEOUS) ×3 IMPLANT
SUCTION FRAZIER 12FR DISP (SUCTIONS) ×3 IMPLANT
SUT MNCRL AB 4-0 PS2 18 (SUTURE) ×3 IMPLANT
SUT VIC AB 1 CT1 36 (SUTURE) ×3 IMPLANT
SUT VIC AB 2-0 CT1 27 (SUTURE) ×6
SUT VIC AB 2-0 CT1 TAPERPNT 27 (SUTURE) ×6 IMPLANT
SUT VLOC 180 0 24IN GS25 (SUTURE) ×3 IMPLANT
SYR 50ML LL SCALE MARK (SYRINGE) ×3 IMPLANT
TOWEL OR 17X26 10 PK STRL BLUE (TOWEL DISPOSABLE) ×3 IMPLANT
TOWEL OR NON WOVEN STRL DISP B (DISPOSABLE) IMPLANT
TRAY FOLEY CATH 14FRSI W/METER (CATHETERS) ×3 IMPLANT
WATER STERILE IRR 1500ML POUR (IV SOLUTION) ×3 IMPLANT
WRAP KNEE MAXI GEL POST OP (GAUZE/BANDAGES/DRESSINGS) ×3 IMPLANT

## 2014-08-19 NOTE — Interval H&P Note (Signed)
History and Physical Interval Note:  08/19/2014 10:13 AM  Selena Bush  has presented today for surgery, with the diagnosis of left knee oa  The various methods of treatment have been discussed with the patient and family. After consideration of risks, benefits and other options for treatment, the patient has consented to  Procedure(s): LEFT TOTAL KNEE ARTHROPLASTY (Left) as a surgical intervention .  The patient's history has been reviewed, patient examined, no change in status, stable for surgery.  I have reviewed the patient's chart and labs.  Questions were answered to the patient's satisfaction.     Mauri Pole

## 2014-08-19 NOTE — Anesthesia Preprocedure Evaluation (Addendum)
Anesthesia Evaluation  Patient identified by MRN, date of birth, ID band Patient awake    Reviewed: Allergy & Precautions, H&P , Patient's Chart, lab work & pertinent test results  Airway Mallampati: III       Dental   Pulmonary sleep apnea , former smoker,  breath sounds clear to auscultation        Cardiovascular Exercise Tolerance: Good hypertension, Rhythm:regular Rate:Normal     Neuro/Psych  Headaches, PSYCHIATRIC DISORDERS Anxiety Depression negative psych ROS   GI/Hepatic GERD-  ,  Endo/Other  Morbid obesity  Renal/GU      Musculoskeletal  (+) Arthritis -,   Abdominal   Peds  Hematology   Anesthesia Other Findings Mitral insufficiency- stress test negative  Cardiac clearance given  Reproductive/Obstetrics                          Anesthesia Physical Anesthesia Plan  ASA: III  Anesthesia Plan: Spinal   Post-op Pain Management:    Induction:   Airway Management Planned: Simple Face Mask  Additional Equipment:   Intra-op Plan:   Post-operative Plan:   Informed Consent: I have reviewed the patients History and Physical, chart, labs and discussed the procedure including the risks, benefits and alternatives for the proposed anesthesia with the patient or authorized representative who has indicated his/her understanding and acceptance.   Dental advisory given  Plan Discussed with: Anesthesiologist, CRNA and Surgeon  Anesthesia Plan Comments:        Anesthesia Quick Evaluation

## 2014-08-19 NOTE — OR Nursing (Signed)
Patients surgery was cancelled after entering the operating room due to severe hypertension.

## 2014-08-19 NOTE — Transfer of Care (Signed)
Immediate Anesthesia Transfer of Care Note  Patient: Selena Bush  Procedure(s) Performed: Procedure(s): LEFT TOTAL KNEE ARTHROPLASTY (Left)  Patient Location: PACU  Anesthesia Type:Case cancelled after entry into OR suite due to excessively high BP.    Level of Consciousness: awake, alert , oriented and patient cooperative  Airway & Oxygen Therapy: Patient Spontanous Breathing and Patient connected to face mask oxygen  Post-op Assessment: Report given to PACU RN and Post -op Vital signs reviewed and stable  Post vital signs: Reviewed and stable  Complications: No apparent anesthesia complications

## 2014-08-19 NOTE — Anesthesia Postprocedure Evaluation (Signed)
  Anesthesia Post Note  Patient: Selena Bush  Procedure(s) Performed: Procedure(s) (LRB): LEFT TOTAL KNEE ARTHROPLASTY (Left)  Anesthesia type: sedation  Patient location: PACU  Post pain: Pain level controlled  Post assessment: Post-op Vital signs reviewed  Last Vitals:  Filed Vitals:   08/19/14 1324  BP: 179/87  Pulse: 67  Temp:   Resp: 18    Post vital signs: Reviewed  Level of consciousness: sedated  Complications: No apparent anesthesia complications... Urgent/emergent BP around 240's/120's.  Decision to postpone until better controlled discussed and agreed upon by MDA and Surgeon.  I personally discussed the plan of care with the patient and her family.

## 2014-08-19 NOTE — Discharge Instructions (Signed)
The cardiology group will be calling you at home to schedule an appointment either this afternoon or tomorrow morning for a cardiology work up

## 2014-08-20 ENCOUNTER — Encounter: Payer: Self-pay | Admitting: Cardiovascular Disease

## 2014-08-20 ENCOUNTER — Ambulatory Visit (INDEPENDENT_AMBULATORY_CARE_PROVIDER_SITE_OTHER): Payer: Medicare Other | Admitting: Cardiovascular Disease

## 2014-08-20 VITALS — BP 132/82 | HR 74 | Ht 64.0 in | Wt 255.3 lb

## 2014-08-20 DIAGNOSIS — I10 Essential (primary) hypertension: Secondary | ICD-10-CM | POA: Diagnosis not present

## 2014-08-20 NOTE — Assessment & Plan Note (Signed)
History of hypertension with blood pressure measured today at 132/82. She is on atenolol,lisinopril, spironolactone, furosemide. Continue current medications at current dosing

## 2014-08-20 NOTE — Patient Instructions (Signed)
Dr Gwenlyn Found has cleared you for your surgery.  Dr Gwenlyn Found recommends that you take all of your blood pressure medications on the day of your procedure.

## 2014-08-20 NOTE — Progress Notes (Signed)
08/20/2014 Selena Bush   March 06, 1946  [id number]  Primary Physician Margarita Rana, MD Primary Cardiologist: Lorretta Harp MD Renae Gloss   HPI:  Selena Bush is a 69 year old moderately overweight married Caucasian female mother of 2, grandmother of 4 grandchildren who is accompanied by her husband can eat today. Her primary care physician is Dr. Margarita Rana at [facility] family practice. She was referred by Dr. Adriana Bush, orthopedic surgeon, for preoperative clearance before elective left total knee replacement. Her only cardiac risk factor is hypertension and hyperlipidemia. She does not appear to be on a statin drug. She has no family history. She has never had a heart attack or stroke. She denies chest pain or shortness of breath. She has had breast cancer in the past status post lumpectomy and radiation therapy. She had an uncomplicated right total knee replacement last year by Dr. Alvan Bush and now presents for left total knee replacement. She had a pharmacologic Myoview stress test on 08/01/14 done for preoperative clearance several weeks ago by Dr. Marnee Bush which was apparently normal (EF 56%). She was cleared for surgery. Apparently during induction of anesthesia she had a hypertensive response. She told me that she did not take her lisinopril by morning.   Current Outpatient Prescriptions  Medication Sig Dispense Refill  . atenolol (TENORMIN) 50 MG tablet Take 50 mg by mouth every morning.     . beta carotene 25000 UNIT capsule Take 25,000 Units by mouth daily.    . busPIRone (BUSPAR) 15 MG tablet Take 30 mg by mouth at bedtime.     . Calcium-Vitamin D-Vitamin K (VIACTIV) [phone] MG-UNT-MCG CHEW Chew 1 tablet by mouth daily.    . fish oil-omega-3 fatty acids 1000 MG capsule Take 1 g by mouth daily.    . fluticasone (FLONASE) 50 MCG/ACT nasal spray Place 1 spray into the nose daily as needed for rhinitis or allergies.     . furosemide (LASIX) 40 MG tablet Take  40 mg by mouth daily.     . Glucos-Chondroit-Hyaluron-MSM (GLUCOSAMINE CHONDROITIN JOINT PO) Take by mouth 2 (two) times daily.    [name] ibuprofen (ADVIL,MOTRIN) 200 MG tablet Take 400 mg by mouth 2 (two) times daily.     [name] lisinopril (PRINIVIL,ZESTRIL) 20 MG tablet Take 20 mg by mouth daily with breakfast.     . LORazepam (ATIVAN) 0.5 MG tablet Take 1 tablet (0.5 mg total) by mouth 2 (two) times daily as needed for anxiety. (Patient taking differently: Take 0.5 mg by mouth daily. ) 30 tablet 0  . NEXIUM 40 MG capsule Take 40 mg by mouth daily.    . potassium chloride (MICRO-K) 10 MEQ CR capsule Take 10 mEq by mouth daily.     [name] spironolactone (ALDACTONE) 25 MG tablet Take 25 mg by mouth every morning.    . Suvorexant (BELSOMRA) 20 MG TABS Take 20 mg by mouth at bedtime.    . tamoxifen (NOLVADEX) 20 MG tablet Take 20 mg by mouth daily.    [name] venlafaxine XR (EFFEXOR-XR) 150 MG 24 hr capsule Take 150 mg by mouth daily with breakfast.    . vitamin C (ASCORBIC ACID) 500 MG tablet Take 1,000 mg by mouth daily.    . vitamin E 400 UNIT capsule Take 400 Units by mouth.     No current facility-administered medications for this visit.    Allergies  Allergen Reactions  . Codeine     Gi problems   . Penicillins  anaphylaxis    . Statins Other (See Comments)    Leg pains  . Sulfa Antibiotics     anaphylaxis     History   Social History  . Marital Status: Married    Spouse Name: N/A    Number of Children: N/A  . Years of Education: N/A   Occupational History  . Not on file.   Social History Main Topics  . Smoking status: Former Smoker -- 0 years    Types: Cigarettes  . Smokeless tobacco: Never Used     Comment: social smoker as a teen  . Alcohol Use: No  . Drug Use: No  . Sexual Activity: Not on file   Other Topics Concern  . Not on file   Social History Narrative     Review of Systems: General: negative for chills, fever, night sweats or weight changes.  Cardiovascular:  negative for chest pain, dyspnea on exertion, edema, orthopnea, palpitations, paroxysmal nocturnal dyspnea or shortness of breath Dermatological: negative for rash Respiratory: negative for cough or wheezing Urologic: negative for hematuria Abdominal: negative for nausea, vomiting, diarrhea, bright red blood per rectum, melena, or hematemesis Neurologic: negative for visual changes, syncope, or dizziness All other systems reviewed and are otherwise negative except as noted above.    Blood pressure 132/82, pulse 74, height 5\' 4"  (1.626 m), weight 255 lb 4.8 oz (115.803 kg).  General appearance: alert and no distress Neck: no adenopathy, no carotid bruit, no JVD, supple, symmetrical, trachea midline and thyroid not enlarged, symmetric, no tenderness/mass/nodules Lungs: clear to auscultation bilaterally Heart: regular rate and rhythm, S1, S2 normal, no murmur, click, rub or gallop Extremities: extremities normal, atraumatic, no cyanosis or edema  EKG normal sinus rhythm at 74 with nonspecific ST and T-wave changes. I personally reviewed this EKG  ASSESSMENT AND PLAN:   Essential hypertension, benign History of hypertension with blood pressure measured today at 132/82. She is on atenolol,lisinopril, spironolactone, furosemide. Continue current medications at current dosing      Lorretta Harp MD [facility], [facility] 08/20/2014 2:19 PM

## 2014-08-20 NOTE — H&P (Signed)
TOTAL KNEE ADMISSION H&P  Patient is being admitted for left total knee arthroplasty.  Subjective:  Chief Complaint:left knee pain.  HPI: Selena Bush, 69 y.o. female, has a history of pain and functional disability in the left knee due to arthritis and has failed non-surgical conservative treatments for greater than 12 weeks to include corticosteriod injections, viscosupplementation injections, use of assistive devices and activity modification.  Onset of symptoms was gradual, starting 6+ years ago with gradually worsening course since that time. The patient noted prior procedures on the knee to include  arthroscopy on the left knee per Dr. Alvan Dame on 04/16/2013.  Patient currently rates pain in the left knee(s) at 9 out of 10 with activity. Patient has worsening of pain with activity and weight bearing, pain that interferes with activities of daily living, pain with passive range of motion, crepitus and joint swelling.  Patient has evidence of periarticular osteophytes and joint space narrowing by imaging studies.  There is no active infection.  Risks, benefits and expectations were discussed with the patient.  Risks including but not limited to the risk of anesthesia, blood clots, nerve damage, blood vessel damage, failure of the prosthesis, infection and up to and including death.  Patient understand the risks, benefits and expectations and wishes to proceed with surgery.   SAY:TKZSWFU, NANCY, MD  D/C Plans: Home with HHPT  Post-op Meds: No Rx given  Tranexamic Acid: To be given - IV   Decadron: Is to be given  FYI: Xarelto post-op Dilaudid post-op CPAP  PREVIOUS COMPONENTS USED:  DePuy rotating platform posterior stabilized knee system:  Size 3 femur  Size 2.5 tibia  10 mm PS insert  38 patellabutton   Patient Active Problem List   Diagnosis Date Noted  . S/P left TKA 08/19/2014  . Morbid obesity 04/17/2013  .  Hyponatremia 04/17/2013  . DCIS (ductal carcinoma in situ) 11/05/2012  . Essential hypertension, benign 11/05/2012   Past Medical History  Diagnosis Date  . Hypertension   . Diffuse cystic mastopathy 2014  . Anxiety   . Depression   . Pain     LUMBAR PAIN -DDD  . GERD (gastroesophageal reflux disease)   . Mitral insufficiency   . Hypercholesterolemia   . Sleep difficulties     LUNESTA HAS HELPED  . Sleep apnea     USES CPAP  . 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 Arthritis     OA BOTH KNEES, neck, back  . Headache     rare    Past Surgical History  Procedure Laterality Date  . Colonoscopy  2015     1 benign polyp-every 5 years  . Ercp  1995  . Mastectomy Right 10/19/2012    PARTIAL MASTECTOMY  . Cholecystectomy  1994  . Tubal ligation  1979  . Abdominal hysterectomy  1992  . Total knee arthroplasty Right 04/16/2013    Procedure: RIGHT TOTAL KNEE ARTHROPLASTY;  Surgeon: Mauri Pole, MD;  Location: [facility] ORS;  Service: Orthopedics;  Laterality: Right;  . Breast surgery Right March 2014    10 mm papillary DCIS, ER/PR positive. Sentinel node negative. Partial breast radiation.  . Joint replacement Right Sept 2014    knee    No prescriptions prior to admission   Allergies  Allergen Reactions  . Codeine  Gi problems   . Penicillins     anaphylaxis    . Statins Other (See Comments)    Leg pains  . Sulfa Antibiotics     anaphylaxis     History  Substance Use Topics  . Smoking status: Former Smoker -- 0 years    Types: Cigarettes  . Smokeless tobacco: Never Used     Comment: social smoker as a teen  . Alcohol Use: No    Family History  Problem Relation Age of Onset  . Colon cancer Maternal Grandfather   . Breast cancer Maternal Grandfather   . Colon cancer Mother       Review of Systems  Constitutional: Negative.   Eyes: Negative.   Respiratory: Negative.   Cardiovascular: Negative.   Gastrointestinal: Positive for heartburn.  Genitourinary: Negative.   Musculoskeletal: Positive for back pain and joint pain.  Skin: Negative.   Neurological: Positive for headaches.  Endo/Heme/Allergies: Negative.   Psychiatric/Behavioral: Positive for depression. The patient is nervous/anxious.     Objective:  Physical Exam  Constitutional: She is oriented to person, place, and time. She appears well-developed and well-nourished.  HENT:  Head: Normocephalic and atraumatic.  Eyes: Pupils are equal, round, and reactive to light.  Neck: Neck supple. No JVD present. No tracheal deviation present. No thyromegaly present.  Cardiovascular: Normal rate, regular rhythm, normal heart sounds and intact distal pulses.   Respiratory: Effort normal and breath sounds normal. No stridor. No respiratory distress. She has no wheezes.  GI: Soft. There is no tenderness. There is no guarding.  Musculoskeletal:       Left knee: She exhibits decreased range of motion, swelling and bony tenderness. She exhibits no ecchymosis, no deformity, no laceration and no erythema. Tenderness found.  Lymphadenopathy:    She has no cervical adenopathy.  Neurological: She is alert and oriented to person, place, and time.  Skin: Skin is warm and dry.  Psychiatric: She has a normal mood and affect.      Labs:  Estimated body mass index is 43.80 kg/(m^2) as calculated from the following:   Height as of 08/19/14: 5\' 4"  (1.626 m).   Weight as of 08/20/14: 115.803 kg (255 lb 4.8 oz).   Imaging Review Plain radiographs demonstrate severe degenerative joint disease of the left knee(s). The overall alignment is neutral. The bone quality appears to be good for age and reported activity level.  Assessment/Plan:  End stage arthritis, left knee   The patient history, physical examination,  clinical judgment of the provider and imaging studies are consistent with end stage degenerative joint disease of the left knee(s) and total knee arthroplasty is deemed medically necessary. The treatment options including medical management, injection therapy arthroscopy and arthroplasty were discussed at length. The risks and benefits of total knee arthroplasty were presented and reviewed. The risks due to aseptic loosening, infection, stiffness, patella tracking problems, thromboembolic complications and other imponderables were discussed. The patient acknowledged the explanation, agreed to proceed with the plan and consent was signed. Patient is being admitted for inpatient treatment for surgery, pain control, PT, OT, prophylactic antibiotics, VTE prophylaxis, progressive ambulation and ADL's and discharge planning. The patient is planning to be discharged home with home health services.      [place] Pugh Darnelle Derrick   PA-C  08/20/2014, 4:00 PM

## 2014-08-29 DIAGNOSIS — M1990 Unspecified osteoarthritis, unspecified site: Secondary | ICD-10-CM | POA: Diagnosis not present

## 2014-08-29 DIAGNOSIS — I10 Essential (primary) hypertension: Secondary | ICD-10-CM | POA: Diagnosis not present

## 2014-09-09 ENCOUNTER — Encounter (INDEPENDENT_AMBULATORY_CARE_PROVIDER_SITE_OTHER): Payer: Self-pay

## 2014-09-09 ENCOUNTER — Encounter (HOSPITAL_COMMUNITY)
Admission: RE | Admit: 2014-09-09 | Discharge: 2014-09-09 | Disposition: A | Discharge status: Home / Self Care | Payer: Medicare Other | Source: Ambulatory Visit | Attending: Orthopedic Surgery | Admitting: Orthopedic Surgery

## 2014-09-09 ENCOUNTER — Encounter (HOSPITAL_COMMUNITY): Payer: Self-pay

## 2014-09-09 DIAGNOSIS — M179 Osteoarthritis of knee, unspecified: Secondary | ICD-10-CM | POA: Insufficient documentation

## 2014-09-09 DIAGNOSIS — Z01818 Encounter for other preprocedural examination: Secondary | ICD-10-CM | POA: Diagnosis not present

## 2014-09-09 LAB — URINALYSIS, ROUTINE W REFLEX MICROSCOPIC
Bilirubin Urine: NEGATIVE
Glucose, UA: NEGATIVE mg/dL
HGB URINE DIPSTICK: NEGATIVE
Ketones, ur: NEGATIVE mg/dL
Leukocytes, UA: NEGATIVE
NITRITE: NEGATIVE
PH: 5.5 (ref 5.0–8.0)
Protein, ur: NEGATIVE mg/dL
Specific Gravity, Urine: 1.014 (ref 1.005–1.030)
UROBILINOGEN UA: 0.2 mg/dL (ref 0.0–1.0)

## 2014-09-09 LAB — BASIC METABOLIC PANEL
ANION GAP: 11 (ref 5–15)
BUN: 23 mg/dL (ref 6–23)
CALCIUM: 9.4 mg/dL (ref 8.4–10.5)
CO2: 30 mmol/L (ref 19–32)
Chloride: 98 mmol/L (ref 96–112)
Creatinine, Ser: 1.35 mg/dL — ABNORMAL HIGH (ref 0.50–1.10)
GFR calc non Af Amer: 40 mL/min — ABNORMAL LOW (ref >90)
GFR, EST AFRICAN AMERICAN: 46 mL/min — AB (ref >90)
Glucose, Bld: 140 mg/dL — ABNORMAL HIGH (ref 70–99)
Potassium: 4.1 mmol/L (ref 3.5–5.1)
Sodium: 139 mmol/L (ref 135–145)

## 2014-09-09 LAB — CBC
HCT: 40.4 % (ref 36.0–46.0)
Hemoglobin: 12.6 g/dL (ref 12.0–15.0)
MCH: 28 pg (ref 26.0–34.0)
MCHC: 31.2 g/dL (ref 30.0–36.0)
MCV: 89.8 fL (ref 78.0–100.0)
PLATELETS: 254 10*3/uL (ref 150–400)
RBC: 4.5 MIL/uL (ref 3.87–5.11)
RDW: 14.2 % (ref 11.5–15.5)
WBC: 8.7 10*3/uL (ref 4.0–10.5)

## 2014-09-09 LAB — PROTIME-INR
INR: 0.96 (ref 0.00–1.49)
Prothrombin Time: 12.9 seconds (ref 11.6–15.2)

## 2014-09-09 LAB — APTT: aPTT: 24 seconds (ref 24–37)

## 2014-09-09 LAB — SURGICAL PCR SCREEN
MRSA, PCR: NEGATIVE
Staphylococcus aureus: NEGATIVE

## 2014-09-09 NOTE — Progress Notes (Signed)
Pt spoke with Dr. Ermalene Postin concerning what meds to take the morning of surgery since Dr. Gwenlyn Found and Dr. Venia Minks had told her to take all BP meds the day of surgery. Dr. Ermalene Postin agreed for pt to take the Lisinopril / lasix / spirolactone the morning of surgery.

## 2014-09-09 NOTE — Patient Instructions (Addendum)
LEXIS POTENZA  09/09/2014   Your procedure is scheduled on: 09/15/14   Report to [facility] Main  Entrance and follow signs to               [place] at 5:30 AM.   Call this number if you have problems the morning of surgery [phone]   Remember:  Do not eat food or drink liquids :After Midnight.     Take these medicines the morning of surgery with A SIP OF WATER: ATENOLOL / NEXIUM / VENLAFAXINE               Bring C PAP MASK AND TUBING TO HOSPITAL                               You may not have any metal on your body including hair pins and              piercings  Do not wear jewelry, make-up, lotions, powders or perfumes.             Do not wear nail polish.  Do not shave  48 hours prior to surgery.              Men may shave face and neck.   Do not bring valuables to the hospital. [place].  Contacts, dentures or bridgework may not be worn into surgery.  Leave suitcase in the car. After surgery it may be brought to your room.     Patients discharged the day of surgery will not be allowed to drive home.  Name and phone number of your driver:  Special Instructions: N/A              Please read over the following fact sheets you were given: _____________________________________________________________________                                                     Selena Bush  Before surgery, you can play an important role.  Because skin is not sterile, your skin needs to be as free of germs as possible.  You can reduce the number of germs on your skin by washing with CHG (chlorahexidine gluconate) soap before surgery.  CHG is an antiseptic cleaner which kills germs and bonds with the skin to continue killing germs even after washing. Please DO NOT use if you have an allergy to CHG or antibacterial soaps.  If your skin becomes reddened/irritated stop using the CHG and  inform your nurse when you arrive at Short Stay. Do not shave (including legs and underarms) for at least 48 hours prior to the first CHG shower.  You may shave your face. Please follow these instructions carefully:   1.  Shower with CHG Soap the night before surgery and the  morning of Surgery.   2.  If you choose to wash your hair, wash your hair first as usual with your  normal  Shampoo.   3.  After you shampoo, rinse your hair and body thoroughly to remove the  shampoo.  4.  Use CHG as you would any other liquid soap.  You can apply chg directly  to the skin and wash . Gently wash with scrungie or clean wascloth    5.  Apply the CHG Soap to your body ONLY FROM THE NECK DOWN.   Do not use on open                           Wound or open sores. Avoid contact with eyes, ears mouth and genitals (private parts).                        Genitals (private parts) with your normal soap.              6.  Wash thoroughly, paying special attention to the area where your surgery  will be performed.   7.  Thoroughly rinse your body with warm water from the neck down.   8.  DO NOT shower/wash with your normal soap after using and rinsing off  the CHG Soap .                9.  Pat yourself dry with a clean towel.             10.  Wear clean pajamas.             11.  Place clean sheets on your bed the night of your first shower and do not  sleep with pets.  Day of Surgery : Do not apply any lotions/deodorants the morning of surgery.  Please wear clean clothes to the hospital/surgery center.  FAILURE TO FOLLOW THESE INSTRUCTIONS MAY RESULT IN THE CANCELLATION OF YOUR SURGERY    PATIENT SIGNATURE_________________________________  ______________________________________________________________________     Selena Bush  An incentive spirometer is a tool that can help keep your lungs clear and active. This tool measures how well you are filling  your lungs with each breath. Taking long deep breaths may help reverse or decrease the chance of developing breathing (pulmonary) problems (especially infection) following:  A long period of time when you are unable to move or be active. BEFORE THE PROCEDURE   If the spirometer includes an indicator to show your best effort, your nurse or respiratory therapist will set it to a desired goal.  If possible, sit up straight or lean slightly forward. Try not to slouch.  Hold the incentive spirometer in an upright position. INSTRUCTIONS FOR USE   Sit on the edge of your bed if possible, or sit up as far as you can in bed or on a chair.  Hold the incentive spirometer in an upright position.  Breathe out normally.  Place the mouthpiece in your mouth and seal your lips tightly around it.  Breathe in slowly and as deeply as possible, raising the piston or the ball toward the top of the column.  Hold your breath for 3-5 seconds or for as long as possible. Allow the piston or ball to fall to the bottom of the column.  Remove the mouthpiece from your mouth and breathe out normally.  Rest for a few seconds and repeat Steps 1 through 7 at least 10 times every 1-2 hours when you are awake. Take your time and take a few normal breaths between deep breaths.  The spirometer may include an indicator to show your best effort. Use the indicator as a goal to work toward during  each repetition.  After each set of 10 deep breaths, practice coughing to be sure your lungs are clear. If you have an incision (the cut made at the time of surgery), support your incision when coughing by placing a pillow or rolled up towels firmly against it. Once you are able to get out of bed, walk around indoors and cough well. You may stop using the incentive spirometer when instructed by your caregiver.  RISKS AND COMPLICATIONS  Take your time so you do not get dizzy or light-headed.  If you are in pain, you may need to  take or ask for pain medication before doing incentive spirometry. It is harder to take a deep breath if you are having pain. AFTER USE  Rest and breathe slowly and easily.  It can be helpful to keep track of a log of your progress. Your caregiver can provide you with a simple table to help with this. If you are using the spirometer at home, follow these instructions: [place] IF:   You are having difficultly using the spirometer.  You have trouble using the spirometer as often as instructed.  Your pain medication is not giving enough relief while using the spirometer.  You develop fever of 100.5 F (38.1 C) or higher. SEEK IMMEDIATE MEDICAL CARE IF:   You cough up bloody sputum that had not been present before.  You develop fever of 102 F (38.9 C) or greater.  You develop worsening pain at or near the incision site. MAKE SURE YOU:   Understand these instructions.  Will watch your condition.  Will get help right away if you are not doing well or get worse. Document Released: 12/12/2006 Document Revised: 10/24/2011 Document Reviewed: 02/12/2007 Exit[employer] Patient Information 2014 [employer], [place].   ________________________________________________________________________  WHAT IS A BLOOD TRANSFUSION? Blood Transfusion Information  A transfusion is the replacement of blood or some of its parts. Blood is made up of multiple cells which provide different functions.  Red blood cells carry oxygen and are used for blood loss replacement.  White blood cells fight against infection.  Platelets control bleeding.  Plasma helps clot blood.  Other blood products are available for specialized needs, such as hemophilia or other clotting disorders. BEFORE THE TRANSFUSION  Who gives blood for transfusions?   Healthy volunteers who are fully evaluated to make sure their blood is safe. This is blood bank blood. Transfusion therapy is the safest it has ever been in the  practice of medicine. Before blood is taken from a donor, a complete history is taken to make sure that person has no history of diseases nor engages in risky social behavior (examples are intravenous drug use or sexual activity with multiple partners). The donor's travel history is screened to minimize risk of transmitting infections, such as malaria. The donated blood is tested for signs of infectious diseases, such as HIV and hepatitis. The blood is then tested to be sure it is compatible with you in order to minimize the chance of a transfusion reaction. If you or a relative donates blood, this is often done in anticipation of surgery and is not appropriate for emergency situations. It takes many days to process the donated blood. RISKS AND COMPLICATIONS Although transfusion therapy is very safe and saves many lives, the main dangers of transfusion include:   Getting an infectious disease.  Developing a transfusion reaction. This is an allergic reaction to something in the blood you were given. Every precaution is taken to prevent  this. The decision to have a blood transfusion has been considered carefully by your caregiver before blood is given. Blood is not given unless the benefits outweigh the risks. AFTER THE TRANSFUSION  Right after receiving a blood transfusion, you will usually feel much better and more energetic. This is especially true if your red blood cells have gotten low (anemic). The transfusion raises the level of the red blood cells which carry oxygen, and this usually causes an energy increase.  The nurse administering the transfusion will monitor you carefully for complications. HOME CARE INSTRUCTIONS  No special instructions are needed after a transfusion. You may find your energy is better. Speak with your caregiver about any limitations on activity for underlying diseases you may have. SEEK MEDICAL CARE IF:   Your condition is not improving after your transfusion.  You  develop redness or irritation at the intravenous (IV) site. SEEK IMMEDIATE MEDICAL CARE IF:  Any of the following symptoms occur over the next 12 hours:  Shaking chills.  You have a temperature by mouth above 102 F (38.9 C), not controlled by medicine.  Chest, back, or muscle pain.  People around you feel you are not acting correctly or are confused.  Shortness of breath or difficulty breathing.  Dizziness and fainting.  You get a rash or develop hives.  You have a decrease in urine output.  Your urine turns a dark color or changes to pink, red, or brown. Any of the following symptoms occur over the next 10 days:  You have a temperature by mouth above 102 F (38.9 C), not controlled by medicine.  Shortness of breath.  Weakness after normal activity.  The white part of the eye turns yellow (jaundice).  You have a decrease in the amount of urine or are urinating less often.  Your urine turns a dark color or changes to pink, red, or brown. Document Released: 07/29/2000 Document Revised: 10/24/2011 Document Reviewed: 03/17/2008 [facility] Patient Information 2014 [place], [place].  _______________________________________________________________________

## 2014-09-14 NOTE — Anesthesia Preprocedure Evaluation (Addendum)
Anesthesia Evaluation  Patient identified by MRN, date of birth, ID band Patient awake    Reviewed: Allergy & Precautions, H&P , NPO status , Patient's Chart, lab work & pertinent test results  Airway Mallampati: I   Neck ROM: Full    Dental  (+) Teeth Intact   Pulmonary sleep apnea , former smoker,  breath sounds clear to auscultation        Cardiovascular Exercise Tolerance: Good hypertension, Rhythm:regular Rate:Normal     Neuro/Psych  Headaches, PSYCHIATRIC DISORDERS negative psych ROS   GI/Hepatic GERD-  ,  Endo/Other  Morbid obesity  Renal/GU      Musculoskeletal  (+) Arthritis -,   Abdominal (+) + obese,   Peds  Hematology   Anesthesia Other Findings Mitral insufficiency- stress test negative  Cardiac clearance given  Reproductive/Obstetrics                            Anesthesia Physical Anesthesia Plan  ASA: III  Anesthesia Plan: Spinal   Post-op Pain Management:    Induction: Intravenous  Airway Management Planned: Natural Airway and Simple Face Mask  Additional Equipment:   Intra-op Plan:   Post-operative Plan:   Informed Consent: I have reviewed the patients History and Physical, chart, labs and discussed the procedure including the risks, benefits and alternatives for the proposed anesthesia with the patient or authorized representative who has indicated his/her understanding and acceptance.     Plan Discussed with: CRNA and Surgeon  Anesthesia Plan Comments:         Anesthesia Quick Evaluation

## 2014-09-15 ENCOUNTER — Inpatient Hospital Stay (HOSPITAL_COMMUNITY): Payer: Medicare Other | Admitting: Anesthesiology

## 2014-09-15 ENCOUNTER — Encounter (HOSPITAL_COMMUNITY)
Admission: RE | Disposition: A | Discharge status: Home Health | Payer: Self-pay | Source: Ambulatory Visit | Attending: Orthopedic Surgery

## 2014-09-15 ENCOUNTER — Encounter (HOSPITAL_COMMUNITY): Payer: Self-pay | Admitting: *Deleted

## 2014-09-15 ENCOUNTER — Inpatient Hospital Stay (HOSPITAL_COMMUNITY)
Admission: RE | Admit: 2014-09-15 | Discharge: 2014-09-17 | DRG: 470 | Disposition: A | Discharge status: Home Health | Payer: Medicare Other | Source: Ambulatory Visit | Attending: Orthopedic Surgery | Admitting: Orthopedic Surgery

## 2014-09-15 DIAGNOSIS — Z880 Allergy status to penicillin: Secondary | ICD-10-CM

## 2014-09-15 DIAGNOSIS — M659 Synovitis and tenosynovitis, unspecified: Secondary | ICD-10-CM | POA: Diagnosis present

## 2014-09-15 DIAGNOSIS — Z96652 Presence of left artificial knee joint: Secondary | ICD-10-CM

## 2014-09-15 DIAGNOSIS — Z6841 Body Mass Index (BMI) 40.0 and over, adult: Secondary | ICD-10-CM | POA: Diagnosis not present

## 2014-09-15 DIAGNOSIS — I10 Essential (primary) hypertension: Secondary | ICD-10-CM | POA: Diagnosis not present

## 2014-09-15 DIAGNOSIS — K219 Gastro-esophageal reflux disease without esophagitis: Secondary | ICD-10-CM | POA: Diagnosis present

## 2014-09-15 DIAGNOSIS — G4730 Sleep apnea, unspecified: Secondary | ICD-10-CM | POA: Diagnosis not present

## 2014-09-15 DIAGNOSIS — M1712 Unilateral primary osteoarthritis, left knee: Secondary | ICD-10-CM | POA: Diagnosis not present

## 2014-09-15 DIAGNOSIS — M179 Osteoarthritis of knee, unspecified: Secondary | ICD-10-CM | POA: Diagnosis not present

## 2014-09-15 DIAGNOSIS — E662 Morbid (severe) obesity with alveolar hypoventilation: Secondary | ICD-10-CM | POA: Diagnosis present

## 2014-09-15 DIAGNOSIS — Z87891 Personal history of nicotine dependence: Secondary | ICD-10-CM | POA: Diagnosis not present

## 2014-09-15 DIAGNOSIS — Z01812 Encounter for preprocedural laboratory examination: Secondary | ICD-10-CM | POA: Diagnosis not present

## 2014-09-15 DIAGNOSIS — Z96659 Presence of unspecified artificial knee joint: Secondary | ICD-10-CM

## 2014-09-15 DIAGNOSIS — M25562 Pain in left knee: Secondary | ICD-10-CM | POA: Diagnosis present

## 2014-09-15 HISTORY — PX: TOTAL KNEE ARTHROPLASTY: SHX125

## 2014-09-15 LAB — TYPE AND SCREEN
ABO/RH(D): AB POS
Antibody Screen: NEGATIVE

## 2014-09-15 SURGERY — ARTHROPLASTY, KNEE, TOTAL
Anesthesia: Spinal | Site: Knee | Laterality: Left

## 2014-09-15 MED ORDER — DEXAMETHASONE SODIUM PHOSPHATE 10 MG/ML IJ SOLN
10.0000 mg | Freq: Once | INTRAMUSCULAR | Status: AC
  Administered 2014-09-15: 10 mg via INTRAVENOUS

## 2014-09-15 MED ORDER — ALUM & MAG HYDROXIDE-SIMETH 200-200-20 MG/5ML PO SUSP: 30.0000 mL | ORAL | Status: DC | PRN

## 2014-09-15 MED ORDER — TRANEXAMIC ACID 100 MG/ML IV SOLN
1000.0000 mg | Freq: Once | INTRAVENOUS | Status: AC
  Administered 2014-09-15: 1000 mg via INTRAVENOUS
  Filled 2014-09-15: qty 10

## 2014-09-15 MED ORDER — METOCLOPRAMIDE HCL 10 MG PO TABS: 5.0000 mg | ORAL_TABLET | Freq: Three times a day (TID) | ORAL | Status: DC | PRN

## 2014-09-15 MED ORDER — METHOCARBAMOL 1000 MG/10ML IJ SOLN
500.0000 mg | Freq: Four times a day (QID) | INTRAVENOUS | Status: DC | PRN
  Administered 2014-09-15: 500 mg via INTRAVENOUS
  Filled 2014-09-15 (×2): qty 5

## 2014-09-15 MED ORDER — SUVOREXANT 20 MG PO TABS: 20.0000 mg | ORAL_TABLET | Freq: Every day | ORAL | Status: DC

## 2014-09-15 MED ORDER — MAGNESIUM CITRATE PO SOLN: 1.0000 | Freq: Once | ORAL | Status: AC | PRN

## 2014-09-15 MED ORDER — KETOROLAC TROMETHAMINE 30 MG/ML IJ SOLN
INTRAMUSCULAR | Status: AC
  Filled 2014-09-15: qty 1

## 2014-09-15 MED ORDER — HYDROMORPHONE HCL 1 MG/ML IJ SOLN
0.5000 mg | INTRAMUSCULAR | Status: DC | PRN
  Administered 2014-09-15 – 2014-09-16 (×6): 1 mg via INTRAVENOUS
  Filled 2014-09-15 (×7): qty 1

## 2014-09-15 MED ORDER — DEXAMETHASONE SODIUM PHOSPHATE 10 MG/ML IJ SOLN
10.0000 mg | Freq: Once | INTRAMUSCULAR | Status: AC
  Administered 2014-09-16: 10 mg via INTRAVENOUS

## 2014-09-15 MED ORDER — VENLAFAXINE HCL ER 150 MG PO CP24
150.0000 mg | ORAL_CAPSULE | Freq: Every day | ORAL | Status: DC
  Administered 2014-09-16 – 2014-09-17 (×2): 150 mg via ORAL
  Filled 2014-09-15 (×3): qty 1

## 2014-09-15 MED ORDER — POTASSIUM CHLORIDE CRYS ER 10 MEQ PO TBCR
10.0000 meq | EXTENDED_RELEASE_TABLET | Freq: Two times a day (BID) | ORAL | Status: DC
  Administered 2014-09-16 – 2014-09-17 (×3): 10 meq via ORAL
  Filled 2014-09-15 (×5): qty 1

## 2014-09-15 MED ORDER — PROPOFOL 10 MG/ML IV BOLUS
INTRAVENOUS | Status: AC
  Filled 2014-09-15: qty 20

## 2014-09-15 MED ORDER — PROPOFOL INFUSION 10 MG/ML OPTIME
INTRAVENOUS | Status: DC | PRN
  Administered 2014-09-15: 100 ug/kg/min via INTRAVENOUS

## 2014-09-15 MED ORDER — TAMOXIFEN CITRATE 10 MG PO TABS
20.0000 mg | ORAL_TABLET | Freq: Every day | ORAL | Status: DC
  Administered 2014-09-15 – 2014-09-16 (×2): 20 mg via ORAL
  Filled 2014-09-15 (×3): qty 2

## 2014-09-15 MED ORDER — SODIUM CHLORIDE 0.9 % IR SOLN
Status: DC | PRN
  Administered 2014-09-15: 1000 mL

## 2014-09-15 MED ORDER — FERROUS SULFATE 325 (65 FE) MG PO TABS
325.0000 mg | ORAL_TABLET | Freq: Three times a day (TID) | ORAL | Status: DC
  Administered 2014-09-16: 325 mg via ORAL
  Filled 2014-09-15 (×8): qty 1

## 2014-09-15 MED ORDER — BUPIVACAINE IN DEXTROSE 0.75-8.25 % IT SOLN
INTRATHECAL | Status: DC | PRN
  Administered 2014-09-15: 1.6 mL via INTRATHECAL

## 2014-09-15 MED ORDER — MIDAZOLAM HCL 2 MG/2ML IJ SOLN
INTRAMUSCULAR | Status: AC
  Filled 2014-09-15: qty 2

## 2014-09-15 MED ORDER — SODIUM CHLORIDE 0.9 % IJ SOLN
INTRAMUSCULAR | Status: AC
  Filled 2014-09-15: qty 50

## 2014-09-15 MED ORDER — PHENOL 1.4 % MT LIQD: 1.0000 | OROMUCOSAL | Status: DC | PRN

## 2014-09-15 MED ORDER — SODIUM CHLORIDE 0.9 % IJ SOLN
INTRAMUSCULAR | Status: DC | PRN
  Administered 2014-09-15: 30 mL

## 2014-09-15 MED ORDER — PROMETHAZINE HCL 25 MG/ML IJ SOLN
6.2500 mg | INTRAMUSCULAR | Status: DC | PRN
Start: 2014-09-15 — End: 2014-09-15

## 2014-09-15 MED ORDER — PHENYLEPHRINE HCL 10 MG/ML IJ SOLN
INTRAMUSCULAR | Status: AC
  Filled 2014-09-15: qty 1

## 2014-09-15 MED ORDER — ONDANSETRON HCL 4 MG PO TABS: 4.0000 mg | ORAL_TABLET | Freq: Four times a day (QID) | ORAL | Status: DC | PRN

## 2014-09-15 MED ORDER — DEXAMETHASONE SODIUM PHOSPHATE 10 MG/ML IJ SOLN
INTRAMUSCULAR | Status: AC
  Filled 2014-09-15: qty 1

## 2014-09-15 MED ORDER — FENTANYL CITRATE 0.05 MG/ML IJ SOLN
INTRAMUSCULAR | Status: DC | PRN
  Administered 2014-09-15: 100 ug via INTRAVENOUS

## 2014-09-15 MED ORDER — MIDAZOLAM HCL 5 MG/5ML IJ SOLN
INTRAMUSCULAR | Status: DC | PRN
  Administered 2014-09-15: 2 mg via INTRAVENOUS

## 2014-09-15 MED ORDER — DIPHENHYDRAMINE HCL 25 MG PO CAPS: 25.0000 mg | ORAL_CAPSULE | Freq: Four times a day (QID) | ORAL | Status: DC | PRN

## 2014-09-15 MED ORDER — LORAZEPAM 1 MG PO TABS
1.0000 mg | ORAL_TABLET | Freq: Every day | ORAL | Status: DC
  Administered 2014-09-15 – 2014-09-16 (×2): 1 mg via ORAL
  Filled 2014-09-15 (×2): qty 1

## 2014-09-15 MED ORDER — METHOCARBAMOL 500 MG PO TABS
500.0000 mg | ORAL_TABLET | Freq: Four times a day (QID) | ORAL | Status: DC | PRN
  Administered 2014-09-15 – 2014-09-17 (×6): 500 mg via ORAL
  Filled 2014-09-15 (×6): qty 1

## 2014-09-15 MED ORDER — DOCUSATE SODIUM 100 MG PO CAPS
100.0000 mg | ORAL_CAPSULE | Freq: Two times a day (BID) | ORAL | Status: DC
  Administered 2014-09-16 – 2014-09-17 (×3): 100 mg via ORAL

## 2014-09-15 MED ORDER — FLUTICASONE PROPIONATE 50 MCG/ACT NA SUSP
1.0000 | Freq: Every day | NASAL | Status: DC | PRN
  Filled 2014-09-15: qty 16

## 2014-09-15 MED ORDER — CLINDAMYCIN PHOSPHATE 600 MG/50ML IV SOLN
600.0000 mg | Freq: Four times a day (QID) | INTRAVENOUS | Status: AC
  Administered 2014-09-15 (×2): 600 mg via INTRAVENOUS
  Filled 2014-09-15 (×2): qty 50

## 2014-09-15 MED ORDER — PANTOPRAZOLE SODIUM 40 MG PO TBEC
80.0000 mg | DELAYED_RELEASE_TABLET | Freq: Every day | ORAL | Status: DC
  Administered 2014-09-16 – 2014-09-17 (×2): 80 mg via ORAL
  Filled 2014-09-15 (×3): qty 2

## 2014-09-15 MED ORDER — PHENYLEPHRINE HCL 10 MG/ML IJ SOLN
10.0000 mg | INTRAMUSCULAR | Status: DC | PRN
  Administered 2014-09-15: 10 ug/min via INTRAVENOUS

## 2014-09-15 MED ORDER — CHLORHEXIDINE GLUCONATE 4 % EX LIQD: 60.0000 mL | Freq: Once | CUTANEOUS | Status: DC

## 2014-09-15 MED ORDER — METOCLOPRAMIDE HCL 5 MG/ML IJ SOLN: 5.0000 mg | Freq: Three times a day (TID) | INTRAMUSCULAR | Status: DC | PRN

## 2014-09-15 MED ORDER — BUPIVACAINE-EPINEPHRINE (PF) 0.25% -1:200000 IJ SOLN
INTRAMUSCULAR | Status: AC
  Filled 2014-09-15: qty 30

## 2014-09-15 MED ORDER — HYDROMORPHONE HCL 1 MG/ML IJ SOLN
INTRAMUSCULAR | Status: AC
  Administered 2014-09-15: 1 mg via INTRAVENOUS
  Filled 2014-09-15: qty 1

## 2014-09-15 MED ORDER — BUPIVACAINE-EPINEPHRINE (PF) 0.25% -1:200000 IJ SOLN
INTRAMUSCULAR | Status: DC | PRN
  Administered 2014-09-15: 30 mL via PERINEURAL

## 2014-09-15 MED ORDER — BUSPIRONE HCL 15 MG PO TABS
30.0000 mg | ORAL_TABLET | Freq: Every day | ORAL | Status: DC
  Administered 2014-09-15 – 2014-09-16 (×2): 30 mg via ORAL
  Filled 2014-09-15 (×3): qty 2

## 2014-09-15 MED ORDER — CLINDAMYCIN PHOSPHATE 900 MG/50ML IV SOLN
INTRAVENOUS | Status: AC
  Filled 2014-09-15: qty 50

## 2014-09-15 MED ORDER — RIVAROXABAN 10 MG PO TABS
10.0000 mg | ORAL_TABLET | ORAL | Status: DC
  Administered 2014-09-16 – 2014-09-17 (×2): 10 mg via ORAL
  Filled 2014-09-15 (×3): qty 1

## 2014-09-15 MED ORDER — PROPOFOL 10 MG/ML IV BOLUS
INTRAVENOUS | Status: DC | PRN
  Administered 2014-09-15 (×3): 20 mg via INTRAVENOUS

## 2014-09-15 MED ORDER — HYDROMORPHONE HCL 2 MG PO TABS
2.0000 mg | ORAL_TABLET | ORAL | Status: DC
  Administered 2014-09-15: 4 mg via ORAL
  Administered 2014-09-15: 2 mg via ORAL
  Administered 2014-09-15 – 2014-09-17 (×10): 4 mg via ORAL
  Filled 2014-09-15 (×11): qty 2
  Filled 2014-09-15: qty 1

## 2014-09-15 MED ORDER — POLYETHYLENE GLYCOL 3350 17 G PO PACK
17.0000 g | PACK | Freq: Two times a day (BID) | ORAL | Status: DC
  Administered 2014-09-16: 17 g via ORAL

## 2014-09-15 MED ORDER — KETOROLAC TROMETHAMINE 30 MG/ML IJ SOLN
INTRAMUSCULAR | Status: DC | PRN
  Administered 2014-09-15: 30 mg via INTRAVENOUS

## 2014-09-15 MED ORDER — ATENOLOL 50 MG PO TABS
50.0000 mg | ORAL_TABLET | Freq: Every morning | ORAL | Status: DC
  Administered 2014-09-16: 50 mg via ORAL
  Filled 2014-09-15 (×2): qty 1

## 2014-09-15 MED ORDER — 0.9 % SODIUM CHLORIDE (POUR BTL) OPTIME
TOPICAL | Status: DC | PRN
  Administered 2014-09-15: 1000 mL

## 2014-09-15 MED ORDER — POTASSIUM CHLORIDE 2 MEQ/ML IV SOLN
INTRAVENOUS | Status: DC
  Administered 2014-09-15 – 2014-09-16 (×2): via INTRAVENOUS
  Filled 2014-09-15 (×7): qty 1000

## 2014-09-15 MED ORDER — LACTATED RINGERS IV SOLN
INTRAVENOUS | Status: DC | PRN
  Administered 2014-09-15 (×3): via INTRAVENOUS

## 2014-09-15 MED ORDER — FENTANYL CITRATE 0.05 MG/ML IJ SOLN
INTRAMUSCULAR | Status: AC
  Filled 2014-09-15: qty 2

## 2014-09-15 MED ORDER — SPIRONOLACTONE 25 MG PO TABS
25.0000 mg | ORAL_TABLET | Freq: Every morning | ORAL | Status: DC
  Administered 2014-09-16: 25 mg via ORAL
  Filled 2014-09-15 (×2): qty 1

## 2014-09-15 MED ORDER — ONDANSETRON HCL 4 MG/2ML IJ SOLN: 4.0000 mg | Freq: Four times a day (QID) | INTRAMUSCULAR | Status: DC | PRN

## 2014-09-15 MED ORDER — MENTHOL 3 MG MT LOZG: 1.0000 | LOZENGE | OROMUCOSAL | Status: DC | PRN

## 2014-09-15 MED ORDER — CLINDAMYCIN PHOSPHATE 900 MG/50ML IV SOLN
900.0000 mg | INTRAVENOUS | Status: AC
  Administered 2014-09-15: 900 mg via INTRAVENOUS

## 2014-09-15 MED ORDER — HYDROMORPHONE HCL 1 MG/ML IJ SOLN
0.2500 mg | INTRAMUSCULAR | Status: DC | PRN
  Administered 2014-09-15 (×2): 0.5 mg via INTRAVENOUS

## 2014-09-15 MED ORDER — FUROSEMIDE 40 MG PO TABS
40.0000 mg | ORAL_TABLET | Freq: Two times a day (BID) | ORAL | Status: DC
  Administered 2014-09-16 – 2014-09-17 (×2): 40 mg via ORAL
  Filled 2014-09-15 (×6): qty 1

## 2014-09-15 MED ORDER — BISACODYL 10 MG RE SUPP: 10.0000 mg | Freq: Every day | RECTAL | Status: DC | PRN

## 2014-09-15 SURGICAL SUPPLY — 61 items
BAG SPEC THK2 15X12 ZIP CLS (MISCELLANEOUS) ×1
BAG ZIPLOCK 12X15 (MISCELLANEOUS) ×1 IMPLANT
BANDAGE ELASTIC 6 VELCRO ST LF (GAUZE/BANDAGES/DRESSINGS) ×2 IMPLANT
BANDAGE ESMARK 6X9 LF (GAUZE/BANDAGES/DRESSINGS) ×1 IMPLANT
BLADE SAW SGTL 13.0X1.19X90.0M (BLADE) ×2 IMPLANT
BNDG CMPR 9X6 STRL LF SNTH (GAUZE/BANDAGES/DRESSINGS) ×1
BNDG ESMARK 6X9 LF (GAUZE/BANDAGES/DRESSINGS) ×2
BOWL SMART MIX CTS (DISPOSABLE) ×2 IMPLANT
CAP KNEE TOTAL 3 SIGMA ×1 IMPLANT
CEMENT HV SMART SET (Cement) ×2 IMPLANT
CUFF TOURN SGL QUICK 34 (TOURNIQUET CUFF)
CUFF TOURN SGL QUICK 44 (TOURNIQUET CUFF) ×1 IMPLANT
CUFF TRNQT CYL 34X4X40X1 (TOURNIQUET CUFF) ×1 IMPLANT
DECANTER SPIKE VIAL GLASS SM (MISCELLANEOUS) ×1 IMPLANT
DRAPE EXTREMITY T 121X128X90 (DRAPE) ×2 IMPLANT
DRAPE POUCH INSTRU U-SHP 10X18 (DRAPES) ×2 IMPLANT
DRAPE U-SHAPE 47X51 STRL (DRAPES) ×2 IMPLANT
DRSG AQUACEL AG ADV 3.5X10 (GAUZE/BANDAGES/DRESSINGS) ×2 IMPLANT
DURAPREP 26ML APPLICATOR (WOUND CARE) ×4 IMPLANT
ELECT REM PT RETURN 9FT ADLT (ELECTROSURGICAL) ×2
ELECTRODE REM PT RTRN 9FT ADLT (ELECTROSURGICAL) ×1 IMPLANT
FACESHIELD WRAPAROUND (MASK) ×8 IMPLANT
FACESHIELD WRAPAROUND OR TEAM (MASK) ×5 IMPLANT
GLOVE BIO SURGEON STRL SZ 6 (GLOVE) ×1 IMPLANT
GLOVE BIO SURGEON STRL SZ7 (GLOVE) ×1 IMPLANT
GLOVE BIOGEL PI IND STRL 7.0 (GLOVE) IMPLANT
GLOVE BIOGEL PI IND STRL 7.5 (GLOVE) ×1 IMPLANT
GLOVE BIOGEL PI IND STRL 8.5 (GLOVE) ×1 IMPLANT
GLOVE BIOGEL PI INDICATOR 7.0 (GLOVE) ×1
GLOVE BIOGEL PI INDICATOR 7.5 (GLOVE) ×2
GLOVE BIOGEL PI INDICATOR 8.5 (GLOVE) ×1
GLOVE ECLIPSE 8.0 STRL XLNG CF (GLOVE) ×2 IMPLANT
GLOVE INDICATOR 6.5 STRL GRN (GLOVE) ×1 IMPLANT
GLOVE ORTHO TXT STRL SZ7.5 (GLOVE) ×4 IMPLANT
GLOVE SURG SS PI 7.5 STRL IVOR (GLOVE) ×1 IMPLANT
GOWN PREVENTION PLUS XLARGE (GOWN DISPOSABLE) ×2 IMPLANT
GOWN PREVENTION PLUS XXLARGE (GOWN DISPOSABLE) ×1 IMPLANT
GOWN SPEC L3 XXLG W/TWL (GOWN DISPOSABLE) ×2 IMPLANT
GOWN STRL REUS W/TWL LRG LVL3 (GOWN DISPOSABLE) ×2 IMPLANT
HANDPIECE INTERPULSE COAX TIP (DISPOSABLE) ×2
KIT BASIN OR (CUSTOM PROCEDURE TRAY) ×2 IMPLANT
LIQUID BAND (GAUZE/BANDAGES/DRESSINGS) ×2 IMPLANT
MANIFOLD NEPTUNE II (INSTRUMENTS) ×2 IMPLANT
NDL SAFETY ECLIPSE 18X1.5 (NEEDLE) ×2 IMPLANT
NEEDLE HYPO 18GX1.5 SHARP (NEEDLE) ×2
PACK TOTAL JOINT (CUSTOM PROCEDURE TRAY) ×2 IMPLANT
POSITIONER SURGICAL ARM (MISCELLANEOUS) ×2 IMPLANT
SET HNDPC FAN SPRY TIP SCT (DISPOSABLE) ×1 IMPLANT
SET PAD KNEE POSITIONER (MISCELLANEOUS) ×2 IMPLANT
SUCTION FRAZIER 12FR DISP (SUCTIONS) ×2 IMPLANT
SUT MNCRL AB 4-0 PS2 18 (SUTURE) ×2 IMPLANT
SUT VIC AB 1 CT1 36 (SUTURE) ×2 IMPLANT
SUT VIC AB 2-0 CT1 27 (SUTURE) ×6
SUT VIC AB 2-0 CT1 TAPERPNT 27 (SUTURE) ×3 IMPLANT
SUT VLOC 180 0 24IN GS25 (SUTURE) ×2 IMPLANT
SYR 50ML LL SCALE MARK (SYRINGE) ×3 IMPLANT
TOWEL OR 17X26 10 PK STRL BLUE (TOWEL DISPOSABLE) ×2 IMPLANT
TOWEL OR NON WOVEN STRL DISP B (DISPOSABLE) ×1 IMPLANT
TRAY FOLEY CATH 14FRSI W/METER (CATHETERS) ×2 IMPLANT
WATER STERILE IRR 1500ML POUR (IV SOLUTION) ×2 IMPLANT
WRAP KNEE MAXI GEL POST OP (GAUZE/BANDAGES/DRESSINGS) ×2 IMPLANT

## 2014-09-15 NOTE — Progress Notes (Signed)
Utilization review completed.  

## 2014-09-15 NOTE — Transfer of Care (Signed)
Immediate Anesthesia Transfer of Care Note  Patient: Selena Bush  Procedure(s) Performed: Procedure(s): LEFT TOTAL KNEE ARTHROPLASTY (Left)  Patient Location: PACU  Anesthesia Type:Spinal  Level of Consciousness: awake, alert , oriented and patient cooperative  Airway & Oxygen Therapy: Patient Spontanous Breathing and Patient connected to face mask oxygen  Post-op Assessment: Report given to RN, Post -op Vital signs reviewed and stable and Patient moving all extremities  Post vital signs: stable  Last Vitals:  Filed Vitals:   09/15/14 0533  BP: 158/83  Pulse: 66  Temp: 36.8 C  Resp: 18    Complications: No apparent anesthesia complications

## 2014-09-15 NOTE — Anesthesia Procedure Notes (Signed)
Spinal Patient location during procedure: OR End time: 09/15/2014 7:38 AM Staffing Resident/CRNA: Enrigue Catena E Preanesthetic Checklist Completed: patient identified, site marked, surgical consent, pre-op evaluation, timeout performed, IV checked, risks and benefits discussed and monitors and equipment checked Spinal Block Patient position: sitting Prep: Betadine Approach: midline Location: L2-3 Injection technique: single-shot Needle Needle type: Spinocan  Needle gauge: 22 G Assessment Sensory level: T4 Additional Notes Pt tolerated spinal well. Spinal kit and drugs within date. CSF flow x3. No paresthesia or heme.

## 2014-09-15 NOTE — Progress Notes (Signed)
PT Cancellation Note  Patient Details Name: Selena Bush MRN: [id number] DOB: 23-Mar-1946   Cancelled Treatment:    Reason Eval/Treat Not Completed: Pain limiting ability to participate. Will follow.    Blondell Reveal Kistler 09/15/2014, 3:36 PM [phone]

## 2014-09-15 NOTE — Interval H&P Note (Signed)
History and Physical Interval Note:  09/15/2014 7:13 AM  Selena Bush  has presented today for surgery, with the diagnosis of left knee osteoarthritis  The various methods of treatment have been discussed with the patient and family. After consideration of risks, benefits and other options for treatment, the patient has consented to  Procedure(s): LEFT TOTAL KNEE ARTHROPLASTY (Left) as a surgical intervention .  The patient's history has been reviewed, patient examined, no change in status, stable for surgery.  I have reviewed the patient's chart and labs.  Questions were answered to the patient's satisfaction.     Mauri Pole

## 2014-09-15 NOTE — Anesthesia Postprocedure Evaluation (Signed)
  Anesthesia Post-op Note  Patient: Selena Bush  Procedure(s) Performed: Procedure(s): LEFT TOTAL KNEE ARTHROPLASTY (Left)  Patient Location: PACU  Anesthesia Type:General  Level of Consciousness: awake and alert   Airway and Oxygen Therapy: Patient Spontanous Breathing  Post-op Pain: mild  Post-op Assessment: Post-op Vital signs reviewed  Post-op Vital Signs: stable  Last Vitals:  Filed Vitals:   09/15/14 1135  BP: 139/60  Pulse: 66  Temp: 36.1 C  Resp: 18    Complications: No apparent anesthesia complications

## 2014-09-15 NOTE — Op Note (Signed)
NAME:  Selena Bush                      MEDICAL RECORD NO.:  [id number]                             FACILITY:  [facility]      PHYSICIAN:  Pietro Cassis. Alvan Dame, M.D.  DATE OF BIRTH:  1946/05/20      DATE OF PROCEDURE:  09/15/2014                                     OPERATIVE REPORT         PREOPERATIVE DIAGNOSIS:  Left knee osteoarthritis.      POSTOPERATIVE DIAGNOSIS:  Left knee osteoarthritis.      FINDINGS:  The patient was noted to have complete loss of cartilage and   bone-on-bone arthritis with associated osteophytes in all three compartments of   the knee with a significant synovitis and associated effusion.      PROCEDURE:  Left total knee replacement.      COMPONENTS USED:  De[employer] Sigma rotating platform posterior stabilized knee   system, a size 3 femur, 3 tibia, 10 mm PS insert, and 38 patellar   button.      SURGEON:  Pietro Cassis. Alvan Dame, M.D.      ASSISTANT:  Danae Orleans, PA-C.      ANESTHESIA:  Spinal.      SPECIMENS:  None.      COMPLICATION:  None.      DRAINS:  None.  EBL: <100      TOURNIQUET TIME:   Total Tourniquet Time Documented: Thigh (Left) - 44 minutes Total: Thigh (Left) - 44 minutes  .      The patient was stable to the recovery room.      INDICATION FOR PROCEDURE:  Selena Bush is a 69 y.o. female patient of   mine.  The patient had been seen, evaluated, and treated conservatively in the   office with medication, activity modification, and injections.  The patient had   radiographic changes of bone-on-bone arthritis with endplate sclerosis and osteophytes noted.      The patient failed conservative measures including medication, injections, and activity modification, and at this point was ready for more definitive measures.   Based on the radiographic changes and failed conservative measures, the patient   decided to proceed with total knee replacement.  Risks of infection,   DVT, component failure, need for revision surgery, postop course,  and   expectations were all   discussed and reviewed.  Consent was obtained for benefit of pain   relief.      PROCEDURE IN DETAIL:  The patient was brought to the operative theater.   Once adequate anesthesia, preoperative antibiotics, 900mg  of Cleocin (due to reported PCN allergy), 1gm of Tranexamic Acid, 10mg  of Decadron administered, the patient was positioned supine with the left thigh tourniquet placed.  The  left lower extremity was prepped and draped in sterile fashion.  A time-   out was performed identifying the patient, planned procedure, and   extremity.      The left lower extremity was placed in the [facility] leg holder.  The leg was   exsanguinated, tourniquet elevated to 250 mmHg.  A midline incision was   made followed by median parapatellar  arthrotomy.  Following initial   exposure, attention was first directed to the patella.  Precut   measurement was noted to be 22-64mm.  I resected down to 108mm and elected to use a   38 patellar button to restore patellar height as well as cover the cut   surface.      The lug holes were drilled and a metal shim was placed to protect the   patella from retractors and saw blades.      At this point, attention was now directed to the femur.  The femoral   canal was opened with a drill, irrigated to try to prevent fat emboli.  An   intramedullary rod was passed at 3 degrees valgus, 11 mm of bone was   resected off the distal femur due to pre-operative flexion contracture.  Following this resection, the tibia was   subluxated anteriorly.  Using the extramedullary guide, 2 mm of bone was resected off   the proximal medial tibia.  We confirmed the gap would be   stable medially and laterally with a 10 mm insert as well as confirmed   the cut was perpendicular in the coronal plane, checking with an alignment rod.  I needed to remove significant posterior medial and lateral femoral osteophytes.     Once this was done, I sized the femur to be  a size 3 in the anterior-   posterior dimension, chose a standard component based on medial and   lateral dimension.  The size 3 rotation block was then pinned in   position anterior referenced using the C-clamp to set rotation.  The   anterior, posterior, and  chamfer cuts were made without difficulty nor   notching making certain that I was along the anterior cortex to help   with flexion gap stability.      The final box cut was made off the lateral aspect of distal femur.      At this point, the tibia was sized to be a size 3, the size 3 tray was   then pinned in position through the medial third of the tubercle,   drilled, and keel punched.  Trial reduction was now carried with a 3 femur,  3 tibia, a 10 mm insert, and the 38 patella botton.  The knee was brought to   extension, full extension with good flexion stability with the patella   tracking through the trochlea without application of pressure.  Given   all these findings, the trial components removed.  Final components were   opened and cement was mixed.  The knee was irrigated with normal saline   solution and pulse lavage.  The synovial lining was   then injected with 30cc of 0.25% Marcaine with epinephrine and 1 cc of Toradol plus 30cc of NS for a   total of 61 cc.      The knee was irrigated.  Final implants were then cemented onto clean and   dried cut surfaces of bone with the knee brought to extension with a 10 mm trial insert.      Once the cement had fully cured, the excess cement was removed   throughout the knee.  I confirmed I was satisfied with the range of   motion and stability, and the final 10 mm PS insert was chosen.  It was   placed into the knee.      The tourniquet had been let down at 44 minutes.  No significant  hemostasis required.  The   extensor mechanism was then reapproximated using #1 Vicryl with the knee   in flexion.  The  remaining wound was closed with 2-0 Vicryl and running 4-0 Monocryl.    The knee was cleaned, dried, dressed sterilely using Dermabond and   Aquacel dressing.  The patient was then   brought to recovery room in stable condition, tolerating the procedure   well.   Please note that Physician Assistant, Danae Orleans, was present for the entirety of the case, and was utilized for pre-operative positioning, peri-operative retractor management, general facilitation of the procedure.  He was also utilized for primary wound closure at the end of the case.              Pietro Cassis Alvan Dame, M.D.    09/15/2014 9:21 AM

## 2014-09-16 ENCOUNTER — Encounter (HOSPITAL_COMMUNITY): Payer: Self-pay | Admitting: Orthopedic Surgery

## 2014-09-16 LAB — BASIC METABOLIC PANEL
Anion gap: 8 (ref 5–15)
BUN: 17 mg/dL (ref 6–23)
CO2: 26 mmol/L (ref 19–32)
Calcium: 8.1 mg/dL — ABNORMAL LOW (ref 8.4–10.5)
Chloride: 100 mmol/L (ref 96–112)
Creatinine, Ser: 0.99 mg/dL (ref 0.50–1.10)
GFR calc non Af Amer: 58 mL/min — ABNORMAL LOW (ref >90)
GFR, EST AFRICAN AMERICAN: 67 mL/min — AB (ref >90)
Glucose, Bld: 130 mg/dL — ABNORMAL HIGH (ref 70–99)
Potassium: 4.5 mmol/L (ref 3.5–5.1)
Sodium: 134 mmol/L — ABNORMAL LOW (ref 135–145)

## 2014-09-16 LAB — CBC
HEMATOCRIT: 30.6 % — AB (ref 36.0–46.0)
HEMOGLOBIN: 10 g/dL — AB (ref 12.0–15.0)
MCH: 28.6 pg (ref 26.0–34.0)
MCHC: 32.7 g/dL (ref 30.0–36.0)
MCV: 87.4 fL (ref 78.0–100.0)
Platelets: 201 10*3/uL (ref 150–400)
RBC: 3.5 MIL/uL — ABNORMAL LOW (ref 3.87–5.11)
RDW: 14.2 % (ref 11.5–15.5)
WBC: 11.2 10*3/uL — AB (ref 4.0–10.5)

## 2014-09-16 MED ORDER — ASPIRIN EC 325 MG PO TBEC: 325.0000 mg | DELAYED_RELEASE_TABLET | Freq: Two times a day (BID) | ORAL | Status: DC

## 2014-09-16 MED ORDER — TIZANIDINE HCL 4 MG PO TABS: 4.0000 mg | ORAL_TABLET | Freq: Four times a day (QID) | ORAL | Status: DC | PRN

## 2014-09-16 MED ORDER — HYDROMORPHONE HCL 2 MG PO TABS
2.0000 mg | ORAL_TABLET | ORAL | Status: DC | PRN
Start: 2014-09-16 — End: 2014-11-18

## 2014-09-16 MED ORDER — FERROUS SULFATE 325 (65 FE) MG PO TABS: 325.0000 mg | ORAL_TABLET | Freq: Three times a day (TID) | ORAL | Status: DC

## 2014-09-16 MED ORDER — KETOROLAC TROMETHAMINE 15 MG/ML IJ SOLN
15.0000 mg | Freq: Four times a day (QID) | INTRAMUSCULAR | Status: AC
  Administered 2014-09-16: 15 mg via INTRAVENOUS
  Filled 2014-09-16: qty 1

## 2014-09-16 MED ORDER — KETOROLAC TROMETHAMINE 15 MG/ML IJ SOLN
15.0000 mg | Freq: Four times a day (QID) | INTRAMUSCULAR | Status: DC
  Administered 2014-09-16 – 2014-09-17 (×3): 15 mg via INTRAVENOUS
  Filled 2014-09-16 (×4): qty 1

## 2014-09-16 MED ORDER — RIVAROXABAN 10 MG PO TABS: 10.0000 mg | ORAL_TABLET | ORAL | Status: DC

## 2014-09-16 MED ORDER — DOCUSATE SODIUM 100 MG PO CAPS: 100.0000 mg | ORAL_CAPSULE | Freq: Two times a day (BID) | ORAL | Status: DC

## 2014-09-16 MED ORDER — POLYETHYLENE GLYCOL 3350 17 G PO PACK: 17.0000 g | PACK | Freq: Two times a day (BID) | ORAL | Status: DC

## 2014-09-16 NOTE — Progress Notes (Signed)
Patient ID: Selena Bush, female   DOB: Nov 29, 1945, 69 y.o.   MRN: [id number] Subjective: 1 Day Post-Op Procedure(s) (LRB): LEFT TOTAL KNEE ARTHROPLASTY (Left)    Patient reports pain as moderate.  Feels like she is ready to go home.  Ready to do some therapy  Objective:   VITALS:   Filed Vitals:   09/16/14 0610  BP: 138/57  Pulse: 71  Temp: 98.2 F (36.8 C)  Resp: 18    Neurovascular intact Incision: dressing C/D/I  LABS  Recent Labs  09/16/14 0450  HGB 10.0*  HCT 30.6*  WBC 11.2*  PLT 201     Recent Labs  09/16/14 0450  NA 134*  K 4.5  BUN 17  CREATININE 0.99  GLUCOSE 130*    No results for input(s): LABPT, INR in the last 72 hours.   Assessment/Plan: 1 Day Post-Op Procedure(s) (LRB): LEFT TOTAL KNEE ARTHROPLASTY (Left)   Advance diet Up with therapy Discharge home with home health after therapy this pm  Reviewed post-op goals RTC in 2 weeks

## 2014-09-16 NOTE — Evaluation (Signed)
Occupational Therapy Evaluation Patient Details Name: Selena Bush MRN: [id number] DOB: Apr 16, 1946 Today's Date: 09/16/2014    History of Present Illness s/p L TKA   Clinical Impression   This 69 year old female was admitted for the above surgery.  She was limited by pain and fatique during OT evaluation.  Husband will assist her and she has 3:1 commode at home.  Pt was mod I with ADLs prior to admission and now needs mod to max A for LB adls and bed mobility/sit to stand.  Recommend HHOT following acute stay.  Goals in acute are for min guard to min A level    Follow Up Recommendations  Home health OT;Supervision/Assistance - 24 hour    Equipment Recommendations  None recommended by OT    Recommendations for Other Services       Precautions / Restrictions Precautions Precautions: Knee Restrictions Weight Bearing Restrictions: No      Mobility Bed Mobility Overal bed mobility: Needs Assistance Bed Mobility: Supine to Sit     Supine to sit: Mod assist     General bed mobility comments: assist for LLE and trunk  Transfers Overall transfer level: Needs assistance Equipment used: Rolling walker (2 wheeled) Transfers: Sit to/from Stand Sit to Stand: Mod assist         General transfer comment: cues for UE/LE placement.  Assist to rise and stabilize    Balance                                            ADL Overall ADL's : Needs assistance/impaired     Grooming: Set up;Sitting   Upper Body Bathing: Set up;Sitting   Lower Body Bathing: Moderate assistance;Sit to/from stand   Upper Body Dressing : Set up;Sitting   Lower Body Dressing: Maximal assistance;Sit to/from stand   Toilet Transfer: Minimal assistance;Ambulation (mod A for sit to stand)   Toileting- Clothing Manipulation and Hygiene: Moderate assistance;Sit to/from stand         General ADL Comments: pt ambulated to bathroom with one sitting break, as she needed to sit:   brought up small chair.  Husband will place 3:1 commode by bedside until pt builds endurance.;  She was limited by both pain and fatique     Vision                     Perception     Praxis      Pertinent Vitals/Pain       Hand Dominance     Extremity/Trunk Assessment Upper Extremity Assessment Upper Extremity Assessment: Overall WFL for tasks assessed           Communication Communication Communication: No difficulties   Cognition Arousal/Alertness: Awake/alert Behavior During Therapy: WFL for tasks assessed/performed Overall Cognitive Status: Within Functional Limits for tasks assessed                     General Comments       Exercises       Shoulder Instructions      Home Living Family/patient expects to be discharged to:: Private residence Living Arrangements: Spouse/significant other                 Bathroom Shower/Tub: Tub/shower unit Shower/tub characteristics: Curtain Biochemist, clinical: Handicapped height     Home Equipment: Bedside commode;Shower seat - built in  Prior Functioning/Environment Level of Independence: Independent;Independent with assistive device(s)             OT Diagnosis: Generalized weakness   OT Problem List: Decreased strength;Decreased activity tolerance;Decreased safety awareness;Decreased knowledge of use of DME or AE;Pain   OT Treatment/Interventions: Self-care/ADL training;DME and/or AE instruction;Patient/family education    OT Goals(Current goals can be found in the care plan section) Acute Rehab OT Goals Patient Stated Goal: home  OT Goal Formulation: With patient Time For Goal Achievement: 09/23/14 Potential to Achieve Goals: Good ADL Goals Pt Will Perform Grooming: standing;with min guard assist Pt Will Transfer to Toilet: with min guard assist;ambulating;bedside commode Pt Will Perform Toileting - Clothing Manipulation and hygiene: with min guard assist;sit to/from stand   OT Frequency: Min 2X/week   Barriers to D/C:            Co-evaluation              End of Session    Activity Tolerance: Patient limited by fatigue;Patient limited by pain Patient left: in chair;with call bell/phone within reach;with family/visitor present   Time: 1027-2536 OT Time Calculation (min): 31 min Charges:  OT General Charges $OT Visit: 1 Procedure OT Evaluation $Initial OT Evaluation Tier I: 1 Procedure OT Treatments $Self Care/Home Management : 8-22 mins G-Codes:    Taaj Hurlbut 09/19/14, 10:34 AM Lesle Chris, OTR/L [phone] 09/19/14

## 2014-09-16 NOTE — Progress Notes (Signed)
Physical Therapy Treatment Note    09/16/14 1500  PT Visit Information  Last PT Received On 09/16/14  Assistance Needed +1  History of Present Illness Pt is a 69 year old female s/p L TKA, hx of R TKA  PT Time Calculation  PT Start Time (ACUTE ONLY) 1450  PT Stop Time (ACUTE ONLY) 1515  PT Time Calculation (min) (ACUTE ONLY) 25 min  Subjective Data  Subjective Pt only able to tolerate short ambulation distance and became dizzy requiring seated rest break.  Pt performed a couple exercsies upon return to bed however limited due to fatigue/lethargy likely due to pain meds as well as reported increased pain during movement.  Pt now plans to stay the night and see how she progresses tomorrow.  Precautions  Precautions Knee  Restrictions  Other Position/Activity Restrictions WBAT  Pain Assessment  Pain Assessment 0-10  Pain Score 8  Pain Location L knee  Pain Descriptors / Indicators Burning;Tightness  Pain Intervention(s) Limited activity within patient's tolerance;Monitored during session;Premedicated before session;Repositioned;Ice applied (premedicated for session, pain increased to 8/10 )  Cognition  Arousal/Alertness Awake/alert  Behavior During Therapy WFL for tasks assessed/performed  Overall Cognitive Status Within Functional Limits for tasks assessed  Bed Mobility  Overal bed mobility Needs Assistance  Bed Mobility Supine to Sit;Sit to Supine  Supine to sit Min assist  Sit to supine Min assist  General bed mobility comments assist for L LE  Transfers  Overall transfer level Needs assistance  Equipment used Rolling walker (2 wheeled)  Transfers Sit to/from Stand  Sit to Stand Min assist  General transfer comment verbal cues for UE and LE positioning, assist to rise and steady as well as control descent  Ambulation/Gait  Ambulation/Gait assistance Min assist  Ambulation Distance (Feet) 20 Feet (x2)  Assistive device Rolling walker (2 wheeled)  Gait Pattern/deviations  Step-to pattern;Antalgic;Trunk flexed  General Gait Details verbal cues for sequence, step length, RW distance, use of RW for pain control, pt reported increased dizziness so recliner brought behind pt for rest break, pt able to ambulate back to room however requiring min assist due to fatigue and pain (per pt)  Exercises  Exercises Total Joint  Total Joint Exercises  Ankle Circles/Pumps AROM;15 reps;Both  Quad Sets AROM;10 reps;Both  Knee Flexion Left;10 reps;AAROM  PT - End of Session  Activity Tolerance Patient limited by pain  Patient left in bed;with call bell/phone within reach;with family/visitor present  PT - Assessment/Plan  PT Plan Current plan remains appropriate  PT Frequency (ACUTE ONLY) 7X/week  Follow Up Recommendations Home health PT  PT equipment None recommended by PT  PT Goal Progression  Progress towards PT goals Progressing toward goals  PT General Charges  $$ ACUTE PT VISIT 1 Procedure  PT Treatments  $Gait Training 23-37 mins   Carmelia Bake, PT, DPT 09/16/2014 Pager: [phone]

## 2014-09-16 NOTE — Evaluation (Signed)
Physical Therapy Evaluation Patient Details Name: Selena Bush MRN: [id number] DOB: 02/28/1946 Today's Date: 09/16/2014   History of Present Illness  Pt is a 69 year old female s/p L TKA, hx of R TKA  Clinical Impression  Pt is s/p L TKA resulting in the deficits listed below (see PT Problem List).  Pt will benefit from skilled PT to increase their independence and safety with mobility to allow discharge to the venue listed below.  Pt very limited today due to pain despite premedication.  Pt only able to tolerate in/out of bathroom then back to bed.  Pt reports she hopes to d/c home today.  Spouse states he can use w/c to bump pt up steps into home.  Plan per pt for PT to return for afternoon session in hopes of decreased pain and more progress.     Follow Up Recommendations Home health PT    Equipment Recommendations  None recommended by PT    Recommendations for Other Services       Precautions / Restrictions Precautions Precautions: Knee Restrictions Weight Bearing Restrictions: No Other Position/Activity Restrictions: WBAT      Mobility  Bed Mobility Overal bed mobility: Needs Assistance Bed Mobility: Sit to Supine     Supine to sit: Mod assist Sit to supine: Min assist   General bed mobility comments: assist for L LE  Transfers Overall transfer level: Needs assistance Equipment used: Rolling walker (2 wheeled) Transfers: Sit to/from Stand Sit to Stand: Min assist         General transfer comment: pt just up from recliner upon entering room with spouse heading to bathroom, assist for controlling descent as well as boost from chair with rest break back to bed  Ambulation/Gait Ambulation/Gait assistance: Min guard Ambulation Distance (Feet): 18 Feet Assistive device: Rolling walker (2 wheeled) Gait Pattern/deviations: Step-to pattern;Antalgic     General Gait Details: verbal cues for sequence, Using RW for UE to take weight and smaller steps for pain  control, posture, required seated rest break upon return to bed from bathroom, unable to tolerate any farther ambulation at this time  Stairs            Wheelchair Mobility    Modified Rankin (Stroke Patients Only)       Balance                                             Pertinent Vitals/Pain Pain Assessment: 0-10 Pain Score: 8  Pain Location: L knee Pain Descriptors / Indicators: Aching;Discomfort Pain Intervention(s): Limited activity within patient's tolerance;Monitored during session;Premedicated before session;Repositioned (declined ice)    Home Living Family/patient expects to be discharged to:: Private residence Living Arrangements: Spouse/significant other   Type of Home: House Home Access: Stairs to enter Entrance Stairs-Rails: Right Entrance Stairs-Number of Steps: 3-4 Home Layout: One level Home Equipment: Shower seat - built in;Bedside commode;Walker - 2 wheels;Wheelchair - manual      Prior Function Level of Independence: Independent               Hand Dominance        Extremity/Trunk Assessment   Upper Extremity Assessment: Overall WFL for tasks assessed           Lower Extremity Assessment: LLE deficits/detail   LLE Deficits / Details: decreased knee extension observed, educated to keep knee straight - no pillows  and pt reports this causes pain so stressed importance of ROM ability in future     Communication   Communication: No difficulties  Cognition Arousal/Alertness: Awake/alert Behavior During Therapy: WFL for tasks assessed/performed Overall Cognitive Status: Within Functional Limits for tasks assessed                      General Comments      Exercises        Assessment/Plan    PT Assessment Patient needs continued PT services  PT Diagnosis Difficulty walking;Acute pain   PT Problem List Decreased strength;Decreased range of motion;Decreased mobility;Pain  PT Treatment  Interventions Functional mobility training;Gait training;DME instruction;Stair training;Patient/family education;Therapeutic activities;Therapeutic exercise   PT Goals (Current goals can be found in the Care Plan section) Acute Rehab PT Goals Patient Stated Goal: home  PT Goal Formulation: With patient Time For Goal Achievement: 09/20/14 Potential to Achieve Goals: Good    Frequency 7X/week   Barriers to discharge        Co-evaluation               End of Session   Activity Tolerance: Patient limited by pain Patient left: in bed;with call bell/phone within reach;with family/visitor present           Time: 5974-7185 PT Time Calculation (min) (ACUTE ONLY): 15 min   Charges:   PT Evaluation $Initial PT Evaluation Tier I: 1 Procedure     PT G Codes:        Selena Bush,KATHrine E 09/16/2014, 12:56 PM Carmelia Bake, PT, DPT 09/16/2014 Pager: [phone]

## 2014-09-16 NOTE — Progress Notes (Signed)
Offered pt assistance with cpap tonight, but she declined.  Pt stated she can/will place it on herself later when ready for bed and feels comfortable doing so.  Pt is using a hospital-provided cpap machine with her nasal mask from home.  Cpap setup on autotitration mode, minimum epap=8cm adjusted to pt comfort with max of 20cm h2o.  Pt wore cpap last night and stated the pressure felt great.  Sterile water added to humidity chamber.  Pt was advised that RT is available all night and encouraged her to call, should she need further assistance.  RN aware.

## 2014-09-16 NOTE — Discharge Instructions (Signed)
Information on my medicine - XARELTO® (Rivaroxaban) ° °This medication education was reviewed with me or my healthcare representative as part of my discharge preparation.  The pharmacist that spoke with me during my hospital stay was:  Tiaunna Buford A, RPH ° °Why was Xarelto® prescribed for you? °Xarelto® was prescribed for you to reduce the risk of blood clots forming after orthopedic surgery. The medical term for these abnormal blood clots is venous thromboembolism (VTE). ° °What do you need to know about xarelto® ? °Take your Xarelto® ONCE DAILY at the same time every day. °You may take it either with or without food. ° °If you have difficulty swallowing the tablet whole, you may crush it and mix in applesauce just prior to taking your dose. ° °Take Xarelto® exactly as prescribed by your doctor and DO NOT stop taking Xarelto® without talking to the doctor who prescribed the medication.  Stopping without other VTE prevention medication to take the place of Xarelto® may increase your risk of developing a clot. ° °After discharge, you should have regular check-up appointments with your healthcare provider that is prescribing your Xarelto®.   ° °What do you do if you miss a dose? °If you miss a dose, take it as soon as you remember on the same day then continue your regularly scheduled once daily regimen the next day. Do not take two doses of Xarelto® on the same day.  ° °Important Safety Information °A possible side effect of Xarelto® is bleeding. You should call your healthcare provider right away if you experience any of the following: °  Bleeding from an injury or your nose that does not stop. °  Unusual colored urine (red or dark brown) or unusual colored stools (red or black). °  Unusual bruising for unknown reasons. °  A serious fall or if you hit your head (even if there is no bleeding). ° °Some medicines may interact with Xarelto® and might increase your risk of bleeding while on Xarelto®. To help avoid  this, consult your healthcare provider or pharmacist prior to using any new prescription or non-prescription medications, including herbals, vitamins, non-steroidal anti-inflammatory drugs (NSAIDs) and supplements. ° °This website has more information on Xarelto®: www.xarelto.com. ° ° ° °

## 2014-09-16 NOTE — Care Management Note (Signed)
    Page 1 of 1   09/16/2014     2:25:26 PM CARE MANAGEMENT NOTE 09/16/2014  Patient:  Selena Bush, Selena Bush   Account Number:  [id number]  Date Initiated:  09/16/2014  Documentation initiated by:  [facility]  Subjective/Objective Assessment:   adm: LEFT TOTAL KNEE ARTHROPLASTY (Left)     Action/Plan:   discharge planning   Anticipated DC Date:  09/16/2014   Anticipated DC Plan:  [place]  CM consult      [facility] Choice  HOME HEALTH   Choice offered to / List presented to:  C-1 Patient           Lydia   Status of service:  Completed, signed off Medicare Important Message given?   (If response is "NO", the following Medicare IM given date fields will be blank) Date Medicare IM given:   Medicare IM given by:   Date Additional Medicare IM given:   Additional Medicare IM given by:    Discharge Disposition:  [place]  Per UR Regulation:    If discussed at Long Length of Stay Meetings, dates discussed:    Comments:  09/16/14 0:;00 CM met with pt in room to offer choice of home health agency.  Pt chooses [facility] to render HHPT.  Address and contact information verifi by pt.  NO DME needed. Referral given to [employer], Tim.  No other Cm needs were communicated.  Mariane Masters, BSN, Cm [phone].

## 2014-09-17 LAB — BASIC METABOLIC PANEL
Anion gap: 11 (ref 5–15)
BUN: 19 mg/dL (ref 6–23)
CHLORIDE: 96 mmol/L (ref 96–112)
CO2: 23 mmol/L (ref 19–32)
Calcium: 8.4 mg/dL (ref 8.4–10.5)
Creatinine, Ser: 0.93 mg/dL (ref 0.50–1.10)
GFR, EST AFRICAN AMERICAN: 73 mL/min — AB (ref >90)
GFR, EST NON AFRICAN AMERICAN: 63 mL/min — AB (ref >90)
Glucose, Bld: 125 mg/dL — ABNORMAL HIGH (ref 70–99)
Potassium: 4.2 mmol/L (ref 3.5–5.1)
Sodium: 130 mmol/L — ABNORMAL LOW (ref 135–145)

## 2014-09-17 LAB — CBC
HCT: 28.6 % — ABNORMAL LOW (ref 36.0–46.0)
Hemoglobin: 9.4 g/dL — ABNORMAL LOW (ref 12.0–15.0)
MCH: 28.7 pg (ref 26.0–34.0)
MCHC: 32.9 g/dL (ref 30.0–36.0)
MCV: 87.2 fL (ref 78.0–100.0)
Platelets: 175 10*3/uL (ref 150–400)
RBC: 3.28 MIL/uL — ABNORMAL LOW (ref 3.87–5.11)
RDW: 14.2 % (ref 11.5–15.5)
WBC: 12.4 10*3/uL — ABNORMAL HIGH (ref 4.0–10.5)

## 2014-09-17 NOTE — Progress Notes (Signed)
Pt stated that she need assistance with putting home CPAP mask back together. While attempting to put CPAP back together, I accidentally broke a piece of the mask. Pt is aware. Notified respiratory therapist. RT placed hospital-provided CPAP mask on pt.

## 2014-09-17 NOTE — Clinical Documentation Improvement (Signed)
  According to nursing flowsheet 09/15/14 pt.  ht: 5'4" wt: 255 lbs.  BMI=43.9     . Document any associated diagnoses/conditions, such as morbid obesity, malnutrition, etc.   Supporting Information: PMH: Morbid obesity, Sleep apneaon admission RT documented uses CPAP   Thank You, Philippa Chester ,RN Clinical Documentation Specialist:  [facility]

## 2014-09-17 NOTE — Progress Notes (Signed)
Physical Therapy Treatment Patient Details Name: Selena Bush MRN: [id number] DOB: 1945-12-08 Today's Date: 09/17/2014    History of Present Illness Pt is a 69 year old female s/p L TKA, hx of R TKA    PT Comments    Pt feeling better overall today, still groggy d/t meds; husband states he is planning to use w/c to get pt up steps with help of his son; pt reports she will have assist from husband and other friends at home;   Follow Up Recommendations  Home health PT;Supervision/Assistance - 24 hour;Supervision for mobility/OOB     Equipment Recommendations  None recommended by PT    Recommendations for Other Services       Precautions / Restrictions Precautions Precautions: Knee Restrictions Other Position/Activity Restrictions: WBAT    Mobility  Bed Mobility               General bed mobility comments: pt OOB  Transfers Overall transfer level: Needs assistance Equipment used: Rolling walker (2 wheeled) Transfers: Sit to/from Stand Sit to Stand: Min guard         General transfer comment: vcs for UE/LE placement  Ambulation/Gait Ambulation/Gait assistance: Min guard Ambulation Distance (Feet): 42 Feet Assistive device: Rolling walker (2 wheeled) Gait Pattern/deviations: Step-to pattern;Antalgic;Trunk flexed     General Gait Details: verbal cues for sequencea dn RW distance from self; pt repeatedly states her arms and back hurt but she is able  to keep going wtih encouragement   Stairs Stairs:  (pt husband planning to use w/c)          Wheelchair Mobility    Modified Rankin (Stroke Patients Only)       Balance                                    Cognition Arousal/Alertness: Suspect due to medications;Lethargic Behavior During Therapy: Anxious Overall Cognitive Status: Within Functional Limits for tasks assessed                      Exercises Total Joint Exercises Ankle Circles/Pumps: AROM;15 reps;Both Quad  Sets: AROM;10 reps;Both Heel Slides: AAROM;Left;10 reps Straight Leg Raises: AAROM;Left;10 reps Goniometric ROM: -7 to 45*    General Comments        Pertinent Vitals/Pain Pain Assessment: 0-10 Pain Location: L knee Pain Descriptors / Indicators: Constant Pain Intervention(s): Limited activity within patient's tolerance;Monitored during session;Premedicated before session;Ice applied    Home Living                      Prior Function            PT Goals (current goals can now be found in the care plan section) Acute Rehab PT Goals Patient Stated Goal: home  PT Goal Formulation: With patient Time For Goal Achievement: 09/20/14 Potential to Achieve Goals: Good Progress towards PT goals: Progressing toward goals    Frequency  7X/week    PT Plan Current plan remains appropriate    Co-evaluation             End of Session Equipment Utilized During Treatment: Gait belt Activity Tolerance: Patient tolerated treatment well (anxious) Patient left: with call bell/phone within reach;with family/visitor present;in chair     Time: 7939-0300 PT Time Calculation (min) (ACUTE ONLY): 31 min  Charges:  $Gait Training: 8-22 mins $Therapeutic Exercise: 8-22 mins  G CodesKenyon Ana 09/17/2014, 12:04 PM

## 2014-09-17 NOTE — Progress Notes (Signed)
Occupational Therapy Treatment Patient Details Name: Selena Bush MRN: [id number] DOB: Jan 24, 1946 Today's Date: 09/17/2014    History of present illness Pt is a 69 year old female s/p L TKA, hx of R TKA   OT comments  Moving better than yesterday but still with limited activity tolerance; drowsy this am  Follow Up Recommendations  Home health OT;Supervision/Assistance - 24 hour    Equipment Recommendations  None recommended by OT    Recommendations for Other Services      Precautions / Restrictions Precautions Precautions: Knee Restrictions Weight Bearing Restrictions: No Other Position/Activity Restrictions: WBAT       Mobility Bed Mobility         Supine to sit: Min assist     General bed mobility comments: assist for LLE  Transfers   Equipment used: Rolling walker (2 wheeled) Transfers: Sit to/from Stand Sit to Stand: Min guard;From elevated surface         General transfer comment: vcs for UE/LE placement    Balance                                   ADL       Grooming: Oral care;Supervision/safety;Sitting;Standing                   Toilet Transfer: Min guard;Ambulation (to recliner)             General ADL Comments: Pt started to stand at sink for teeth; said she needed to sit.  Pt states that back pain usually limits her.  She had no toileting needs at this time.  pt dozed off when sitting in chair.  Needed less assistance than yesterday, but still with limited activity tolerance      Vision                     Perception     Praxis      Cognition   Behavior During Therapy: WFL for tasks assessed/performed Overall Cognitive Status: Within Functional Limits for tasks assessed                       Extremity/Trunk Assessment               Exercises     Shoulder Instructions       General Comments      Pertinent Vitals/ Pain       Pain Score: 4  Pain Location: L knee Pain  Descriptors / Indicators: Sore Pain Intervention(s): Limited activity within patient's tolerance;Monitored during session;Premedicated before session;Repositioned  Home Living                                          Prior Functioning/Environment              Frequency Min 2X/week     Progress Toward Goals  OT Goals(current goals can now be found in the care plan section)  Progress towards OT goals: Progressing toward goals     Plan      Co-evaluation                 End of Session     Activity Tolerance Patient limited by fatigue   Patient Left in chair;with call bell/phone within reach  Nurse Communication          Time: 6160-7371 OT Time Calculation (min): 19 min  Charges: OT General Charges $OT Visit: 1 Procedure OT Treatments $Self Care/Home Management : 8-22 mins  Julius Matus 09/17/2014, 8:49 AM   Lesle Chris, OTR/L [phone] 09/17/2014

## 2014-09-17 NOTE — Progress Notes (Signed)
     Subjective: 2 Days Post-Op Procedure(s) (LRB): LEFT TOTAL KNEE ARTHROPLASTY (Left)   Seen by Dr. Alvan Dame. Patient reports pain as mild, pain controlled. No events throughout the night. Ready to be discharged home.   Objective:   VITALS:   Filed Vitals:   09/17/14  BP: 97/47  Pulse: 88  Temp: 98.3 F (36.8 C)   Resp: 18    Dorsiflexion/Plantar flexion intact Incision: dressing C/D/I No cellulitis present Compartment soft  LABS  Recent Labs  09/16/14 0450 09/17/14 0514  HGB 10.0* 9.4*  HCT 30.6* 28.6*  WBC 11.2* 12.4*  PLT 201 175     Recent Labs  09/16/14 0450 09/17/14 0514  NA 134* 130*  K 4.5 4.2  BUN 17 19  CREATININE 0.99 0.93  GLUCOSE 130* 125*     Assessment/Plan: 2 Days Post-Op Procedure(s) (LRB): LEFT TOTAL KNEE ARTHROPLASTY (Left) Up with therapy Discharge home with home health  Follow up in 2 weeks at [facility]. Follow up with OLIN,Niko Penson D in 2 weeks.  Contact information:  [facility] [address] [place] [address] [phone]    Morbid Obesity (BMI >40)  Estimated body mass index is 43.75 kg/(m^2) as calculated from the following:   Height as of this encounter: 5\' 4"  (1.626 m).   Weight as of this encounter: 115.667 kg (255 lb). Patient also counseled that weight may inhibit the healing process Patient counseled that losing weight will help with future health issues        Selena Bush. Selena Bush   PAC  09/17/2014, 9:47 AM

## 2014-09-18 DIAGNOSIS — Z96652 Presence of left artificial knee joint: Secondary | ICD-10-CM | POA: Diagnosis not present

## 2014-09-18 DIAGNOSIS — R262 Difficulty in walking, not elsewhere classified: Secondary | ICD-10-CM | POA: Diagnosis not present

## 2014-09-18 DIAGNOSIS — I10 Essential (primary) hypertension: Secondary | ICD-10-CM | POA: Diagnosis not present

## 2014-09-18 DIAGNOSIS — Z4733 Aftercare following explantation of knee joint prosthesis: Secondary | ICD-10-CM | POA: Diagnosis not present

## 2014-09-18 DIAGNOSIS — Z96651 Presence of right artificial knee joint: Secondary | ICD-10-CM | POA: Diagnosis not present

## 2014-09-18 DIAGNOSIS — M6281 Muscle weakness (generalized): Secondary | ICD-10-CM | POA: Diagnosis not present

## 2014-09-19 DIAGNOSIS — I10 Essential (primary) hypertension: Secondary | ICD-10-CM | POA: Diagnosis not present

## 2014-09-19 DIAGNOSIS — Z4733 Aftercare following explantation of knee joint prosthesis: Secondary | ICD-10-CM | POA: Diagnosis not present

## 2014-09-19 DIAGNOSIS — M6281 Muscle weakness (generalized): Secondary | ICD-10-CM | POA: Diagnosis not present

## 2014-09-19 DIAGNOSIS — Z96652 Presence of left artificial knee joint: Secondary | ICD-10-CM | POA: Diagnosis not present

## 2014-09-19 DIAGNOSIS — R262 Difficulty in walking, not elsewhere classified: Secondary | ICD-10-CM | POA: Diagnosis not present

## 2014-09-19 DIAGNOSIS — Z96651 Presence of right artificial knee joint: Secondary | ICD-10-CM | POA: Diagnosis not present

## 2014-09-20 DIAGNOSIS — D649 Anemia, unspecified: Secondary | ICD-10-CM | POA: Diagnosis not present

## 2014-09-20 DIAGNOSIS — Z4733 Aftercare following explantation of knee joint prosthesis: Secondary | ICD-10-CM | POA: Diagnosis not present

## 2014-09-20 DIAGNOSIS — R262 Difficulty in walking, not elsewhere classified: Secondary | ICD-10-CM | POA: Diagnosis not present

## 2014-09-20 DIAGNOSIS — J9811 Atelectasis: Secondary | ICD-10-CM | POA: Diagnosis not present

## 2014-09-20 DIAGNOSIS — I509 Heart failure, unspecified: Secondary | ICD-10-CM | POA: Diagnosis not present

## 2014-09-20 DIAGNOSIS — M6281 Muscle weakness (generalized): Secondary | ICD-10-CM | POA: Diagnosis not present

## 2014-09-20 DIAGNOSIS — Z96652 Presence of left artificial knee joint: Secondary | ICD-10-CM | POA: Diagnosis not present

## 2014-09-20 DIAGNOSIS — J80 Acute respiratory distress syndrome: Secondary | ICD-10-CM | POA: Diagnosis not present

## 2014-09-20 DIAGNOSIS — J810 Acute pulmonary edema: Secondary | ICD-10-CM | POA: Diagnosis not present

## 2014-09-20 DIAGNOSIS — R531 Weakness: Secondary | ICD-10-CM | POA: Diagnosis not present

## 2014-09-20 DIAGNOSIS — Z96651 Presence of right artificial knee joint: Secondary | ICD-10-CM | POA: Diagnosis not present

## 2014-09-20 DIAGNOSIS — R0689 Other abnormalities of breathing: Secondary | ICD-10-CM | POA: Diagnosis not present

## 2014-09-20 DIAGNOSIS — I10 Essential (primary) hypertension: Secondary | ICD-10-CM | POA: Diagnosis not present

## 2014-09-20 DIAGNOSIS — I517 Cardiomegaly: Secondary | ICD-10-CM | POA: Diagnosis not present

## 2014-09-20 DIAGNOSIS — J811 Chronic pulmonary edema: Secondary | ICD-10-CM | POA: Diagnosis not present

## 2014-09-20 DIAGNOSIS — J986 Disorders of diaphragm: Secondary | ICD-10-CM | POA: Diagnosis not present

## 2014-09-20 DIAGNOSIS — J9601 Acute respiratory failure with hypoxia: Secondary | ICD-10-CM | POA: Diagnosis not present

## 2014-09-20 LAB — BASIC METABOLIC PANEL
ANION GAP: 7 (ref 7–16)
BUN: 10 mg/dL (ref 7–18)
CALCIUM: 8.5 mg/dL (ref 8.5–10.1)
CO2: 28 mmol/L (ref 21–32)
Chloride: 100 mmol/L (ref 98–107)
Creatinine: 0.91 mg/dL (ref 0.60–1.30)
EGFR (Non-African Amer.): >60
Glucose: 120 mg/dL — ABNORMAL HIGH (ref 65–99)
Osmolality: 270 (ref 275–301)
Potassium: 3.9 mmol/L (ref 3.5–5.1)
Sodium: 135 mmol/L — ABNORMAL LOW (ref 136–145)

## 2014-09-20 LAB — URINALYSIS, COMPLETE
BLOOD: NEGATIVE
Bilirubin,UR: NEGATIVE
Glucose,UR: NEGATIVE mg/dL (ref 0–75)
Leukocyte Esterase: NEGATIVE
NITRITE: NEGATIVE
PROTEIN: NEGATIVE
Ph: 7 (ref 4.5–8.0)
RBC,UR: NONE SEEN /HPF (ref 0–5)
SPECIFIC GRAVITY: 1.008 (ref 1.003–1.030)
Squamous Epithelial: NONE SEEN
WBC UR: <1 /HPF (ref 0–5)

## 2014-09-20 LAB — CBC
HCT: 27.8 % — AB (ref 35.0–47.0)
HGB: 9 g/dL — AB (ref 12.0–16.0)
MCH: 28.3 pg (ref 26.0–34.0)
MCHC: 32.4 g/dL (ref 32.0–36.0)
MCV: 87 fL (ref 80–100)
Platelet: 245 10*3/uL (ref 150–440)
RBC: 3.18 10*6/uL — ABNORMAL LOW (ref 3.80–5.20)
RDW: 15.3 % — AB (ref 11.5–14.5)
WBC: 10.5 10*3/uL (ref 3.6–11.0)

## 2014-09-20 LAB — TROPONIN I: TROPONIN-I: 0.03 ng/mL

## 2014-09-20 LAB — PRO B NATRIURETIC PEPTIDE: B-TYPE NATIURETIC PEPTID: 3302 pg/mL — AB (ref 0–125)

## 2014-09-21 ENCOUNTER — Inpatient Hospital Stay: Payer: Self-pay | Admitting: Internal Medicine

## 2014-09-21 DIAGNOSIS — J189 Pneumonia, unspecified organism: Secondary | ICD-10-CM | POA: Diagnosis not present

## 2014-09-21 DIAGNOSIS — R0902 Hypoxemia: Secondary | ICD-10-CM | POA: Diagnosis not present

## 2014-09-21 DIAGNOSIS — E669 Obesity, unspecified: Secondary | ICD-10-CM | POA: Diagnosis not present

## 2014-09-21 DIAGNOSIS — I340 Nonrheumatic mitral (valve) insufficiency: Secondary | ICD-10-CM

## 2014-09-21 DIAGNOSIS — Z853 Personal history of malignant neoplasm of breast: Secondary | ICD-10-CM | POA: Diagnosis not present

## 2014-09-21 DIAGNOSIS — I509 Heart failure, unspecified: Secondary | ICD-10-CM

## 2014-09-21 DIAGNOSIS — Z96653 Presence of artificial knee joint, bilateral: Secondary | ICD-10-CM | POA: Diagnosis not present

## 2014-09-21 DIAGNOSIS — Z882 Allergy status to sulfonamides status: Secondary | ICD-10-CM | POA: Diagnosis not present

## 2014-09-21 DIAGNOSIS — I5031 Acute diastolic (congestive) heart failure: Secondary | ICD-10-CM | POA: Diagnosis not present

## 2014-09-21 DIAGNOSIS — D649 Anemia, unspecified: Secondary | ICD-10-CM | POA: Diagnosis not present

## 2014-09-21 DIAGNOSIS — Z800 Family history of malignant neoplasm of digestive organs: Secondary | ICD-10-CM | POA: Diagnosis not present

## 2014-09-21 DIAGNOSIS — G4730 Sleep apnea, unspecified: Secondary | ICD-10-CM | POA: Diagnosis not present

## 2014-09-21 DIAGNOSIS — I517 Cardiomegaly: Secondary | ICD-10-CM | POA: Diagnosis not present

## 2014-09-21 DIAGNOSIS — Z7982 Long term (current) use of aspirin: Secondary | ICD-10-CM | POA: Diagnosis not present

## 2014-09-21 DIAGNOSIS — J986 Disorders of diaphragm: Secondary | ICD-10-CM | POA: Diagnosis not present

## 2014-09-21 DIAGNOSIS — Z808 Family history of malignant neoplasm of other organs or systems: Secondary | ICD-10-CM | POA: Diagnosis not present

## 2014-09-21 DIAGNOSIS — Z888 Allergy status to other drugs, medicaments and biological substances status: Secondary | ICD-10-CM | POA: Diagnosis not present

## 2014-09-21 DIAGNOSIS — Z96652 Presence of left artificial knee joint: Secondary | ICD-10-CM | POA: Diagnosis not present

## 2014-09-21 DIAGNOSIS — J9811 Atelectasis: Secondary | ICD-10-CM | POA: Diagnosis not present

## 2014-09-21 DIAGNOSIS — Z90710 Acquired absence of both cervix and uterus: Secondary | ICD-10-CM | POA: Diagnosis not present

## 2014-09-21 DIAGNOSIS — F418 Other specified anxiety disorders: Secondary | ICD-10-CM | POA: Diagnosis not present

## 2014-09-21 DIAGNOSIS — Z7901 Long term (current) use of anticoagulants: Secondary | ICD-10-CM | POA: Diagnosis not present

## 2014-09-21 DIAGNOSIS — R2242 Localized swelling, mass and lump, left lower limb: Secondary | ICD-10-CM | POA: Diagnosis not present

## 2014-09-21 DIAGNOSIS — I5020 Unspecified systolic (congestive) heart failure: Secondary | ICD-10-CM | POA: Diagnosis not present

## 2014-09-21 DIAGNOSIS — J810 Acute pulmonary edema: Secondary | ICD-10-CM | POA: Diagnosis not present

## 2014-09-21 DIAGNOSIS — Z9889 Other specified postprocedural states: Secondary | ICD-10-CM | POA: Diagnosis not present

## 2014-09-21 DIAGNOSIS — Z79899 Other long term (current) drug therapy: Secondary | ICD-10-CM | POA: Diagnosis not present

## 2014-09-21 DIAGNOSIS — Z9049 Acquired absence of other specified parts of digestive tract: Secondary | ICD-10-CM | POA: Diagnosis not present

## 2014-09-21 DIAGNOSIS — J80 Acute respiratory distress syndrome: Secondary | ICD-10-CM | POA: Diagnosis not present

## 2014-09-21 DIAGNOSIS — Z880 Allergy status to penicillin: Secondary | ICD-10-CM | POA: Diagnosis not present

## 2014-09-21 DIAGNOSIS — J9601 Acute respiratory failure with hypoxia: Secondary | ICD-10-CM | POA: Diagnosis not present

## 2014-09-21 DIAGNOSIS — J811 Chronic pulmonary edema: Secondary | ICD-10-CM | POA: Diagnosis not present

## 2014-09-21 DIAGNOSIS — I10 Essential (primary) hypertension: Secondary | ICD-10-CM

## 2014-09-21 DIAGNOSIS — R2241 Localized swelling, mass and lump, right lower limb: Secondary | ICD-10-CM | POA: Diagnosis not present

## 2014-09-21 LAB — CULTURE, BLOOD (SINGLE)

## 2014-09-21 LAB — FERRITIN: FERRITIN (ARMC): 53 ng/mL (ref 8–388)

## 2014-09-21 LAB — IRON AND TIBC
IRON SATURATION: 12 %
IRON: 45 ug/dL — AB (ref 50–170)
Iron Bind.Cap.(Total): 375 ug/dL (ref 250–450)
Unbound Iron-Bind.Cap.: 330 ug/dL

## 2014-09-21 LAB — TROPONIN I: Troponin-I: 0.03 ng/mL

## 2014-09-22 ENCOUNTER — Telehealth: Payer: Self-pay

## 2014-09-22 LAB — BASIC METABOLIC PANEL
Anion Gap: 10 (ref 7–16)
BUN: 13 mg/dL (ref 7–18)
CALCIUM: 8.4 mg/dL — AB (ref 8.5–10.1)
Chloride: 97 mmol/L — ABNORMAL LOW (ref 98–107)
Co2: 29 mmol/L (ref 21–32)
Creatinine: 1.04 mg/dL (ref 0.60–1.30)
EGFR (African American): >60
GFR CALC NON AF AMER: 56 — AB
GLUCOSE: 95 mg/dL (ref 65–99)
Osmolality: 272 (ref 275–301)
Potassium: 3.6 mmol/L (ref 3.5–5.1)
SODIUM: 136 mmol/L (ref 136–145)

## 2014-09-22 NOTE — Telephone Encounter (Signed)
Patient contacted regarding discharge from [facility] on 09/22/14.  Patient understands to follow up with Christell Faith, PA on 10/03/14 at 1:15 at [facility]. Patient understands discharge instructions? yes Patient understands medications and regiment? yes Patient understands to bring all medications to this visit? yes  Pt states that she just got home, is upacking and will call back w/ any further questions.

## 2014-09-23 DIAGNOSIS — G4733 Obstructive sleep apnea (adult) (pediatric): Secondary | ICD-10-CM | POA: Diagnosis not present

## 2014-09-24 DIAGNOSIS — Z4733 Aftercare following explantation of knee joint prosthesis: Secondary | ICD-10-CM | POA: Diagnosis not present

## 2014-09-24 DIAGNOSIS — I10 Essential (primary) hypertension: Secondary | ICD-10-CM | POA: Diagnosis not present

## 2014-09-24 DIAGNOSIS — Z96651 Presence of right artificial knee joint: Secondary | ICD-10-CM | POA: Diagnosis not present

## 2014-09-24 DIAGNOSIS — M6281 Muscle weakness (generalized): Secondary | ICD-10-CM | POA: Diagnosis not present

## 2014-09-24 DIAGNOSIS — Z96652 Presence of left artificial knee joint: Secondary | ICD-10-CM | POA: Diagnosis not present

## 2014-09-24 DIAGNOSIS — R262 Difficulty in walking, not elsewhere classified: Secondary | ICD-10-CM | POA: Diagnosis not present

## 2014-09-24 NOTE — Discharge Summary (Signed)
Physician Discharge Summary  Patient ID: Selena Bush MRN: [id number] DOB/AGE: 17-May-1946 69 y.o.  Admit date: 09/15/2014 Discharge date: 09/17/2014   Procedures:  Procedure(s) (LRB): LEFT TOTAL KNEE ARTHROPLASTY (Left)  Attending Physician:  Dr. Paralee Cancel   Admission Diagnoses:   Left knee primary OA / pain  Discharge Diagnoses:  Principal Problem:   S/P left TKA Active Problems:   Morbid obesity  Past Medical History  Diagnosis Date  . Hypertension   . Diffuse cystic mastopathy 2014  . Anxiety   . Depression   . Pain     LUMBAR PAIN -DDD  . GERD (gastroesophageal reflux disease)   . Mitral insufficiency   . Hypercholesterolemia   . Sleep difficulties     LUNESTA HAS HELPED  . Sleep apnea     USES CPAP  . 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 PARTIAL RIGHT MASTECTOMY FOR BREAST CANCER--HAD RADIATION - NO CHEMO --DR. [place] ONCOLOGIST  . Arthritis     OA BOTH KNEES, neck, back  . Headache     rare    HPI:    Selena Bush, 69 y.o. female, has a history of pain and functional disability in the left knee due to arthritis and has failed non-surgical conservative treatments for greater than 12 weeks to include corticosteriod injections, viscosupplementation injections, use of assistive devices and activity modification. Onset of symptoms was gradual, starting 6+ years ago with gradually worsening course since that time. The patient noted prior procedures on the knee to include arthroscopy on the left knee per Dr. Alvan Dame on 04/16/2013. Patient currently rates pain in the left knee(s) at 9 out of 10 with activity. Patient has worsening of pain with activity and weight bearing, pain that interferes with activities of daily living, pain with passive range of motion, crepitus and joint swelling. Patient has evidence of periarticular osteophytes and joint space narrowing by  imaging studies. There is no active infection. Risks, benefits and expectations were discussed with the patient. Risks including but not limited to the risk of anesthesia, blood clots, nerve damage, blood vessel damage, failure of the prosthesis, infection and up to and including death. Patient understand the risks, benefits and expectations and wishes to proceed with surgery.   PCP: Margarita Rana, MD   Discharged Condition: good  Hospital Course:  Patient underwent the above stated procedure on 09/15/2014. Patient tolerated the procedure well and brought to the recovery room in good condition and subsequently to the floor.  POD #1 BP: 138/57 ; Pulse: 71 ; Temp: 98.2 F (36.8 C) ; Resp: 18 Patient reports pain as moderate. Feels like she is ready to go home. Ready to do some therapy. Dorsiflexion/plantar flexion intact, incision: dressing C/D/I, no cellulitis present and compartment soft.   LABS  Basename    HGB  10.0  HCT  30.6   POD #2  BP: 97/47 ; Pulse: 88 ; Temp: 98.3 F (36.8 C) ; Resp: 18 Patient reports pain as mild, pain controlled. No events throughout the night. Ready to be discharged home. Dorsiflexion/plantar flexion intact, incision: dressing C/D/I, no cellulitis present and compartment soft.   LABS  Basename    HGB  9.4  HCT  28.6    Discharge Exam: General appearance: alert, cooperative and no distress Extremities: Homans sign is negative, no sign of DVT, no edema, redness or tenderness in the calves or thighs and no ulcers, gangrene or  trophic changes  Disposition: Home with follow up in 2 weeks   Follow-up Information    Follow up with Mauri Pole, MD. Schedule an appointment as soon as possible for a visit in 2 weeks.   Specialty:  Orthopedic Surgery   Contact information:   [address] [place] [address] [phone]       Follow up with [facility].   Why:  home health physical therapy   Contact information:    [place] [address] [phone]       Discharge Instructions    Call MD / Call 911    Complete by:  As directed   If you experience chest pain or shortness of breath, CALL 911 and be transported to the hospital emergency room.  If you develope a fever above 101 F, pus (white drainage) or increased drainage or redness at the wound, or calf pain, call your surgeon's office.     Change dressing    Complete by:  As directed   Maintain surgical dressing until follow up in the clinic. If the edges start to pull up, may reinforce with tape. If the dressing is no longer working, may remove and cover with gauze and tape, but must keep the area dry and clean.  Call with any questions or concerns.     Constipation Prevention    Complete by:  As directed   Drink plenty of fluids.  Prune juice may be helpful.  You may use a stool softener, such as Colace (over the counter) 100 mg twice a day.  Use MiraLax (over the counter) for constipation as needed.     Diet - low sodium heart healthy    Complete by:  As directed      Discharge instructions    Complete by:  As directed   Maintain surgical dressing until follow up in the clinic. If the edges start to pull up, may reinforce with tape. If the dressing is no longer working, may remove and cover with gauze and tape, but must keep the area dry and clean.  Follow up in 2 weeks at [facility]. Call with any questions or concerns.     Increase activity slowly as tolerated    Complete by:  As directed      TED hose    Complete by:  As directed   Use stockings (TED hose) for 2 weeks on both leg(s).  You may remove them at night for sleeping.     Weight bearing as tolerated    Complete by:  As directed   Laterality:  left  Extremity:  Lower             Medication List    STOP taking these medications        ibuprofen 200 MG tablet  Commonly known as:  ADVIL,MOTRIN      TAKE these medications         aspirin EC 325 MG tablet  Take 1 tablet (325 mg total) by mouth 2 (two) times daily. Take for 4 weeks.     atenolol 50 MG tablet  Commonly known as:  TENORMIN  Take 50 mg by mouth every morning.     BELSOMRA 20 MG Tabs  Generic drug:  Suvorexant  Take 20 mg by mouth at bedtime.     beta carotene 25000 UNIT capsule  Take 25,000 Units by mouth at bedtime.     busPIRone 15 MG  tablet  Commonly known as:  BUSPAR  Take 30 mg by mouth at bedtime.     docusate sodium 100 MG capsule  Commonly known as:  COLACE  Take 1 capsule (100 mg total) by mouth 2 (two) times daily.     ferrous sulfate 325 (65 FE) MG tablet  Take 1 tablet (325 mg total) by mouth 3 (three) times daily after meals.     fish oil-omega-3 fatty acids 1000 MG capsule  Take 1 g by mouth at bedtime.     fluticasone 50 MCG/ACT nasal spray  Commonly known as:  FLONASE  Place 1 spray into the nose daily as needed for rhinitis or allergies.     furosemide 40 MG tablet  Commonly known as:  LASIX  Take 40 mg by mouth 2 (two) times daily.     GLUCOSAMINE CHONDROITIN JOINT PO  Take 1 tablet by mouth 2 (two) times daily.     HYDROmorphone 2 MG tablet  Commonly known as:  DILAUDID  Take 1-2 tablets (2-4 mg total) by mouth every 4 (four) hours as needed for severe pain.     lisinopril 20 MG tablet  Commonly known as:  PRINIVIL,ZESTRIL  Take 20 mg by mouth daily with breakfast.     LORazepam 0.5 MG tablet  Commonly known as:  ATIVAN  Take 1 tablet (0.5 mg total) by mouth 2 (two) times daily as needed for anxiety.     NEXIUM 40 MG capsule  Generic drug:  esomeprazole  Take 40 mg by mouth every morning.     polyethylene glycol packet  Commonly known as:  MIRALAX / GLYCOLAX  Take 17 g by mouth 2 (two) times daily.     potassium chloride 10 MEQ CR capsule  Commonly known as:  MICRO-K  Take 10 mEq by mouth 2 (two) times daily.     rivaroxaban 10 MG Tabs tablet  Commonly known as:  XARELTO  Take 1 tablet (10 mg  total) by mouth daily.     spironolactone 25 MG tablet  Commonly known as:  ALDACTONE  Take 25 mg by mouth every morning.     tamoxifen 20 MG tablet  Commonly known as:  NOLVADEX  Take 20 mg by mouth at bedtime.     tiZANidine 4 MG tablet  Commonly known as:  ZANAFLEX  Take 1 tablet (4 mg total) by mouth every 6 (six) hours as needed for muscle spasms.     venlafaxine XR 150 MG 24 hr capsule  Commonly known as:  EFFEXOR-XR  Take 150 mg by mouth daily with breakfast.     VIACTIV 500-500-40 MG-UNT-MCG Chew  Generic drug:  Calcium-Vitamin D-Vitamin K  Chew 1 tablet by mouth at bedtime.     vitamin C 500 MG tablet  Commonly known as:  ASCORBIC ACID  Take 1,000 mg by mouth every morning.     vitamin E 400 UNIT capsule  Take 400 Units by mouth every morning.         Signed: West Pugh. Tykee Heideman   PA-C  09/24/2014, 1:00 PM

## 2014-09-26 DIAGNOSIS — Z96652 Presence of left artificial knee joint: Secondary | ICD-10-CM | POA: Diagnosis not present

## 2014-09-26 DIAGNOSIS — M6281 Muscle weakness (generalized): Secondary | ICD-10-CM | POA: Diagnosis not present

## 2014-09-26 DIAGNOSIS — Z96651 Presence of right artificial knee joint: Secondary | ICD-10-CM | POA: Diagnosis not present

## 2014-09-26 DIAGNOSIS — Z4733 Aftercare following explantation of knee joint prosthesis: Secondary | ICD-10-CM | POA: Diagnosis not present

## 2014-09-26 DIAGNOSIS — R262 Difficulty in walking, not elsewhere classified: Secondary | ICD-10-CM | POA: Diagnosis not present

## 2014-09-26 DIAGNOSIS — I10 Essential (primary) hypertension: Secondary | ICD-10-CM | POA: Diagnosis not present

## 2014-09-29 ENCOUNTER — Encounter: Payer: Self-pay | Admitting: Physician Assistant

## 2014-09-29 ENCOUNTER — Other Ambulatory Visit: Payer: Self-pay | Admitting: Physician Assistant

## 2014-09-29 DIAGNOSIS — I5032 Chronic diastolic (congestive) heart failure: Secondary | ICD-10-CM | POA: Insufficient documentation

## 2014-09-29 DIAGNOSIS — E785 Hyperlipidemia, unspecified: Secondary | ICD-10-CM | POA: Insufficient documentation

## 2014-09-29 DIAGNOSIS — M170 Bilateral primary osteoarthritis of knee: Secondary | ICD-10-CM

## 2014-09-29 DIAGNOSIS — E669 Obesity, unspecified: Secondary | ICD-10-CM

## 2014-09-30 DIAGNOSIS — Z4733 Aftercare following explantation of knee joint prosthesis: Secondary | ICD-10-CM | POA: Diagnosis not present

## 2014-09-30 DIAGNOSIS — I10 Essential (primary) hypertension: Secondary | ICD-10-CM | POA: Diagnosis not present

## 2014-09-30 DIAGNOSIS — R262 Difficulty in walking, not elsewhere classified: Secondary | ICD-10-CM | POA: Diagnosis not present

## 2014-09-30 DIAGNOSIS — Z96652 Presence of left artificial knee joint: Secondary | ICD-10-CM | POA: Diagnosis not present

## 2014-09-30 DIAGNOSIS — M6281 Muscle weakness (generalized): Secondary | ICD-10-CM | POA: Diagnosis not present

## 2014-09-30 DIAGNOSIS — Z96651 Presence of right artificial knee joint: Secondary | ICD-10-CM | POA: Diagnosis not present

## 2014-10-02 DIAGNOSIS — I5032 Chronic diastolic (congestive) heart failure: Secondary | ICD-10-CM | POA: Diagnosis not present

## 2014-10-02 DIAGNOSIS — I5033 Acute on chronic diastolic (congestive) heart failure: Secondary | ICD-10-CM | POA: Diagnosis not present

## 2014-10-02 DIAGNOSIS — J189 Pneumonia, unspecified organism: Secondary | ICD-10-CM | POA: Diagnosis not present

## 2014-10-03 ENCOUNTER — Encounter (INDEPENDENT_AMBULATORY_CARE_PROVIDER_SITE_OTHER): Payer: Self-pay

## 2014-10-03 ENCOUNTER — Encounter: Payer: Self-pay | Admitting: *Deleted

## 2014-10-03 ENCOUNTER — Ambulatory Visit (INDEPENDENT_AMBULATORY_CARE_PROVIDER_SITE_OTHER): Payer: Medicare Other | Admitting: Physician Assistant

## 2014-10-03 VITALS — BP 130/80 | HR 72 | Ht 64.0 in | Wt 252.5 lb

## 2014-10-03 DIAGNOSIS — E669 Obesity, unspecified: Secondary | ICD-10-CM

## 2014-10-03 DIAGNOSIS — I5032 Chronic diastolic (congestive) heart failure: Secondary | ICD-10-CM

## 2014-10-03 DIAGNOSIS — M170 Bilateral primary osteoarthritis of knee: Secondary | ICD-10-CM

## 2014-10-03 DIAGNOSIS — I10 Essential (primary) hypertension: Secondary | ICD-10-CM | POA: Diagnosis not present

## 2014-10-03 DIAGNOSIS — I509 Heart failure, unspecified: Secondary | ICD-10-CM | POA: Diagnosis not present

## 2014-10-03 DIAGNOSIS — E785 Hyperlipidemia, unspecified: Secondary | ICD-10-CM

## 2014-10-03 NOTE — Progress Notes (Signed)
Patient Name: Selena Bush, Selena Bush 12/25/1945, MRN [id number]  Date of Encounter: 10/03/2014  Primary Care Provider:  Margarita Rana, MD Primary Cardiologist:  Dr. Gwenlyn Found, MD  Chief Complaint  Patient presents with  . other    Follow up from [place] reviewed by the patient verbally.     HPI:  69 year old female with history of right sided breast cancer s/p partial mastectomy and radiation (no chemo), HTN, HLD, OSA on CPAP, bilateral osteoarthritis s/p right TKA 04/16/13 and left TKA 09/15/14, and GERD who presents for hospital follow up for recent admission to [facility] on 2/7-2/8 for acute diastolic CHF.   Patient has previously seen Dr. Gwenlyn Found for pre-op clearance of her left TKA and HTN. She has also seen Dr. Clayborn Bigness for pre-op evaluation in 07/2014 during which time she underwent nuclear stressing testing on 08/01/2014 that what negative for ischemia, negative for WMA, EF of 66%.    She has known HTN and HLD. She takes atenolol,lisinopril, spironolactone, and furosemide for her HTN. She is not on a statin for her HLD 2/2 myalgias.   She underwent successful left TKA on 09/15/2014. Post op course was uneventful and she was discharged on 2/3.   She presented to [facility] on 2/7 with slow onset of SOB with some associated mild lower extremity edema. No chest pain, arm pain, PND, or orthopnea. She also noted post op she was having increased dyspnea more easily. She presented upon having SOB with rest. EKG with no acute changes and cardiac enzymes were negative. CTA of the chest was negative for PE. It was also noted to have a linear and patchy opacity in the left lower lobe. This was slightly greater than expected for atelectasis and may reflect pneumonia. Aspiration could have a similar appearance. Bilateral lower extremity dopplers were negative for DVT. She was already taking Xarelto 10 mg daily for DVT PPX. Echo showed EF 98-33%, diastolic dysfunction, mild LVH, normal RV size and  systolic function, mildly dilated left atrium at 4.3 cm, mild MR/TR, mildly elevated PASP at 36.7 mm Hg. She was diuresed with IV Lasix, transitioned to PO Lasix and discharged on Lasix 20 mg daily, Aldactone 25 mg daily, lisinopril 20 mg daily, and atenolol 50 mg daily, Xarelto 10 mg daily (PPX), and Levaquin 500 mg. She had a long discussion with Dr. Percival Spanish regarding her lifestyle choices including fluid and salt consumption during her admission.   She comes in stating she is doing great since her discharge and that she is doing great today. She is tolerating her medications without issues. No SOB, lower extremity edema, palpitations, or chest pains. She has changed her diet and is cutting back on the salts. She is limiting her po fluid intake. No orthopnea. No early satiety. No cough.         Past Medical History  Diagnosis Date  . Hypertension   . Diffuse cystic mastopathy 2014  . Anxiety   . Depression   . GERD (gastroesophageal reflux disease)   . Chronic diastolic CHF (congestive heart failure)     a. echo 09/2014: EF 82-50%, diastolic dysfunction, mild LVH, nl RV size & systolic function, mildly dilated LA (4.3 cm), mild MR/TR, mildly elevated PASP 36.7 mm Hg  . HLD (hyperlipidemia)     a. statin intolerant 2/2 myalgias  . Sleep difficulties     LUNESTA HAS HELPED  . OSA on CPAP   . Malignant neoplasm of upper-outer quadrant of female breast  10/2012    Papillary DCIS, sentinel node negative. DR/PR positive. PARTIAL RIGHT MASTECTOMY FOR BREAST CANCER--HAD RADIATION - NO CHEMO --DR. [place] ONCOLOGIST  . Osteoarthritis of both knees     a. s/p right TKA 04/2013 & left TKA 09/2014  . Headache     rare  . DDD (degenerative disc disease), cervical   . DDD (degenerative disc disease), lumbar   . Obesity   . Otitis externa   . Vaginal cyst   . Hypercholesterolemia   : Past Surgical History  Procedure Laterality Date  . Colonoscopy  2015     1 benign polyp-every 5 years    . Ercp  1995  . Mastectomy Right 10/19/2012    PARTIAL MASTECTOMY  . Cholecystectomy  1994  . Tubal ligation  1979  . Abdominal hysterectomy  1992  . Total knee arthroplasty Right 04/16/2013    Procedure: RIGHT TOTAL KNEE ARTHROPLASTY;  Surgeon: Mauri Pole, MD;  Location: [facility];  Service: Orthopedics;  Laterality: Right;  . Joint replacement Right Sept 2014    knee  . Breast surgery Right March 2014    lumpectomy RT 10 mm papillary DCIS, ER/PR positive. Sentinel node negative. Partial breast radiation.  . Total knee arthroplasty Left 09/15/2014    Procedure: LEFT TOTAL KNEE ARTHROPLASTY;  Surgeon: Mauri Pole, MD;  Location: [facility];  Service: Orthopedics;  Laterality: Left;  : Family History  Problem Relation Age of Onset  . Colon cancer Maternal Grandfather   . Breast cancer Maternal Grandfather   . Colon cancer Mother   :  reports that she has never smoked. She has never used smokeless tobacco. She reports that she does not drink alcohol or use illicit drugs.:   Allergies:  Allergies  Allergen Reactions  . Codeine     Gi problems   . Penicillins     anaphylaxis    . Statins Other (See Comments)    Leg pains  . Sulfa Antibiotics     anaphylaxis      Home Medications:  Current Outpatient Prescriptions  Medication Sig Dispense Refill  . aspirin EC 325 MG tablet Take 1 tablet (325 mg total) by mouth 2 (two) times daily. Take for 4 weeks. 60 tablet 0  . atenolol (TENORMIN) 50 MG tablet Take 50 mg by mouth every morning.     . beta carotene 25000 UNIT capsule Take 25,000 Units by mouth at bedtime.     . busPIRone (BUSPAR) 15 MG tablet Take 30 mg by mouth at bedtime.     . Calcium-Vitamin D-Vitamin K (VIACTIV) 283-151-76 MG-UNT-MCG CHEW Chew 1 tablet by mouth at bedtime.     . docusate sodium (COLACE) 100 MG capsule Take 1 capsule (100 mg total) by mouth 2 (two) times daily. 10 capsule 0  . ferrous sulfate 325 (65 FE) MG tablet Take 1 tablet (325 mg total) by mouth 3  (three) times daily after meals.  3  . fish oil-omega-3 fatty acids 1000 MG capsule Take 1 g by mouth at bedtime.     . fluticasone (FLONASE) 50 MCG/ACT nasal spray Place 1 spray into the nose daily as needed for rhinitis or allergies.     . furosemide (LASIX) 40 MG tablet Take 40 mg by mouth 2 (two) times daily.     . Glucos-Chondroit-Hyaluron-MSM (GLUCOSAMINE CHONDROITIN JOINT PO) Take 1 tablet by mouth 2 (two) times daily.     [name] HYDROmorphone (DILAUDID) 2 MG tablet Take 1-2 tablets (2-4 mg total)  by mouth every 4 (four) hours as needed for severe pain. 100 tablet 0  . lisinopril (PRINIVIL,ZESTRIL) 20 MG tablet Take 20 mg by mouth daily with breakfast.     . LORazepam (ATIVAN) 0.5 MG tablet Take 1 tablet (0.5 mg total) by mouth 2 (two) times daily as needed for anxiety. (Patient taking differently: Take 1 mg by mouth at bedtime. ) 30 tablet 0  . NEXIUM 40 MG capsule Take 40 mg by mouth every morning.     . polyethylene glycol (MIRALAX / GLYCOLAX) packet Take 17 g by mouth 2 (two) times daily. 14 each 0  . potassium chloride (MICRO-K) 10 MEQ CR capsule Take 10 mEq by mouth 2 (two) times daily.     . rivaroxaban (XARELTO) 10 MG TABS tablet Take 1 tablet (10 mg total) by mouth daily. 14 tablet 0  . spironolactone (ALDACTONE) 25 MG tablet Take 25 mg by mouth every morning.    . Suvorexant (BELSOMRA) 20 MG TABS Take 20 mg by mouth at bedtime.    . tamoxifen (NOLVADEX) 20 MG tablet Take 20 mg by mouth at bedtime.     [name] tiZANidine (ZANAFLEX) 4 MG tablet Take 1 tablet (4 mg total) by mouth every 6 (six) hours as needed for muscle spasms. 30 tablet 0  . venlafaxine XR (EFFEXOR-XR) 150 MG 24 hr capsule Take 150 mg by mouth daily with breakfast.    . vitamin C (ASCORBIC ACID) 500 MG tablet Take 1,000 mg by mouth every morning.     . vitamin E 400 UNIT capsule Take 400 Units by mouth every morning.      No current facility-administered medications for this visit.    Weights: Wt Readings from Last 3  Encounters:  10/03/14 252 lb 8 oz (114.533 kg)  09/15/14 255 lb (115.667 kg)  09/09/14 255 lb (115.667 kg)     Review of Systems:  As above. All other systems reviewed and are otherwise negative except as noted above.  Physical Exam:  Height 5\' 4"  (1.626 m), weight 252 lb 8 oz (114.533 kg).  General: Pleasant, NAD Psych: Normal affect. Neuro: Alert and oriented X 3. Moves all extremities spontaneously. HEENT: Normal  Neck: Supple without bruits or JVD. Lungs:  Resp regular and unlabored, CTA. Heart: RRR no s3, s4, or murmurs. Abdomen: Soft, non-tender, non-distended, BS + x 4.  Extremities: No clubbing, cyanosis or edema.    Accessory Clinical Findings:  EKG - NSR, 72 bpm, nonspecific st/t changes V6  Other studies Reviewed: Additional studies/ records that were reviewed today include: [facility] records.  Recent Labs: 09/17/2014: BUN 19; Creatinine 0.93; Hemoglobin 9.4*; Platelets 175; Potassium 4.2; Sodium 130*    Assessment & Plan:  1. Chronic diastolic CHF:  -Euvolemic today -Continue Lasix 20 mg daily, atenolol 50 mg daily, lisinopril 20 mg daily, Aldactone 25 mg daily, KCl 10 mEq daily -She has made significant lifestyle changes including limiting her salt intake (though she admits there are still changes that must be made) -She is limiting her fluid intake  2. HTN:  -Well controlled today and well controlled at home -She reports this is a significant improvement from previous -Continue current medications as listed above  3. HLD:  -Intolerant to statins 2/2 leg myalgias  -On fish oil 1000 mg daily  -Check FLP at follow up  4. Obesity:  -Lifestyle changes are a must for longevity (she is working on these)  -Fluid and salt restrictions discussed  -Weight loss   5. Status post  left TKA:  -Per orthopedics   Dispo: -Check bmet -Follow up 3 months   Christell Faith, PA-C [address] [place], [place] [id number] [phone] [facility] 10/03/2014, 1:24 PM

## 2014-10-03 NOTE — Patient Instructions (Signed)
Your physician recommends that you continue on your current medications as directed. Please refer to the Current Medication list given to you today.  We will draw labs today:  BMET  Your physician recommends that you schedule a follow-up appointment in: 3 months

## 2014-10-04 LAB — BASIC METABOLIC PANEL
BUN/Creatinine Ratio: 13 (ref 11–26)
BUN: 13 mg/dL (ref 8–27)
CO2: 24 mmol/L (ref 18–29)
Calcium: 9.1 mg/dL (ref 8.7–10.3)
Chloride: 95 mmol/L — ABNORMAL LOW (ref 97–108)
Creatinine, Ser: 1 mg/dL (ref 0.57–1.00)
GFR calc Af Amer: 68 mL/min/{1.73_m2} (ref >59)
GFR, EST NON AFRICAN AMERICAN: 59 mL/min/{1.73_m2} — AB (ref >59)
Glucose: 105 mg/dL — ABNORMAL HIGH (ref 65–99)
Potassium: 5 mmol/L (ref 3.5–5.2)
Sodium: 137 mmol/L (ref 134–144)

## 2014-10-08 DIAGNOSIS — Z96652 Presence of left artificial knee joint: Secondary | ICD-10-CM | POA: Diagnosis not present

## 2014-10-08 DIAGNOSIS — M25562 Pain in left knee: Secondary | ICD-10-CM | POA: Diagnosis not present

## 2014-10-14 DIAGNOSIS — Z96652 Presence of left artificial knee joint: Secondary | ICD-10-CM | POA: Diagnosis not present

## 2014-10-14 DIAGNOSIS — M25562 Pain in left knee: Secondary | ICD-10-CM | POA: Diagnosis not present

## 2014-10-16 DIAGNOSIS — M25562 Pain in left knee: Secondary | ICD-10-CM | POA: Diagnosis not present

## 2014-10-16 DIAGNOSIS — Z96652 Presence of left artificial knee joint: Secondary | ICD-10-CM | POA: Diagnosis not present

## 2014-10-17 DIAGNOSIS — M25562 Pain in left knee: Secondary | ICD-10-CM | POA: Diagnosis not present

## 2014-10-17 DIAGNOSIS — Z96652 Presence of left artificial knee joint: Secondary | ICD-10-CM | POA: Diagnosis not present

## 2014-10-20 DIAGNOSIS — Z96652 Presence of left artificial knee joint: Secondary | ICD-10-CM | POA: Diagnosis not present

## 2014-10-20 DIAGNOSIS — M25562 Pain in left knee: Secondary | ICD-10-CM | POA: Diagnosis not present

## 2014-10-21 DIAGNOSIS — J189 Pneumonia, unspecified organism: Secondary | ICD-10-CM | POA: Diagnosis not present

## 2014-10-21 DIAGNOSIS — I5020 Unspecified systolic (congestive) heart failure: Secondary | ICD-10-CM | POA: Diagnosis not present

## 2014-10-21 DIAGNOSIS — I10 Essential (primary) hypertension: Secondary | ICD-10-CM | POA: Diagnosis not present

## 2014-10-21 DIAGNOSIS — R0902 Hypoxemia: Secondary | ICD-10-CM | POA: Diagnosis not present

## 2014-10-22 DIAGNOSIS — M25562 Pain in left knee: Secondary | ICD-10-CM | POA: Diagnosis not present

## 2014-10-22 DIAGNOSIS — Z96652 Presence of left artificial knee joint: Secondary | ICD-10-CM | POA: Diagnosis not present

## 2014-10-24 DIAGNOSIS — Z96652 Presence of left artificial knee joint: Secondary | ICD-10-CM | POA: Diagnosis not present

## 2014-10-24 DIAGNOSIS — M25562 Pain in left knee: Secondary | ICD-10-CM | POA: Diagnosis not present

## 2014-10-29 DIAGNOSIS — Z471 Aftercare following joint replacement surgery: Secondary | ICD-10-CM | POA: Diagnosis not present

## 2014-10-29 DIAGNOSIS — Z96652 Presence of left artificial knee joint: Secondary | ICD-10-CM | POA: Diagnosis not present

## 2014-10-30 DIAGNOSIS — M25562 Pain in left knee: Secondary | ICD-10-CM | POA: Diagnosis not present

## 2014-10-30 DIAGNOSIS — Z96652 Presence of left artificial knee joint: Secondary | ICD-10-CM | POA: Diagnosis not present

## 2014-10-31 DIAGNOSIS — M25562 Pain in left knee: Secondary | ICD-10-CM | POA: Diagnosis not present

## 2014-10-31 DIAGNOSIS — Z96652 Presence of left artificial knee joint: Secondary | ICD-10-CM | POA: Diagnosis not present

## 2014-11-04 DIAGNOSIS — M25562 Pain in left knee: Secondary | ICD-10-CM | POA: Diagnosis not present

## 2014-11-04 DIAGNOSIS — Z96652 Presence of left artificial knee joint: Secondary | ICD-10-CM | POA: Diagnosis not present

## 2014-11-06 DIAGNOSIS — Z96652 Presence of left artificial knee joint: Secondary | ICD-10-CM | POA: Diagnosis not present

## 2014-11-06 DIAGNOSIS — M25562 Pain in left knee: Secondary | ICD-10-CM | POA: Diagnosis not present

## 2014-11-07 DIAGNOSIS — Z9889 Other specified postprocedural states: Secondary | ICD-10-CM | POA: Diagnosis not present

## 2014-11-07 DIAGNOSIS — Z08 Encounter for follow-up examination after completed treatment for malignant neoplasm: Secondary | ICD-10-CM | POA: Diagnosis not present

## 2014-11-07 DIAGNOSIS — R928 Other abnormal and inconclusive findings on diagnostic imaging of breast: Secondary | ICD-10-CM | POA: Diagnosis not present

## 2014-11-07 DIAGNOSIS — Z853 Personal history of malignant neoplasm of breast: Secondary | ICD-10-CM | POA: Diagnosis not present

## 2014-11-12 DIAGNOSIS — Z96652 Presence of left artificial knee joint: Secondary | ICD-10-CM | POA: Diagnosis not present

## 2014-11-12 DIAGNOSIS — M25562 Pain in left knee: Secondary | ICD-10-CM | POA: Diagnosis not present

## 2014-11-14 ENCOUNTER — Encounter: Payer: Self-pay | Admitting: General Surgery

## 2014-11-14 DIAGNOSIS — M25562 Pain in left knee: Secondary | ICD-10-CM | POA: Diagnosis not present

## 2014-11-14 DIAGNOSIS — Z96652 Presence of left artificial knee joint: Secondary | ICD-10-CM | POA: Diagnosis not present

## 2014-11-18 ENCOUNTER — Ambulatory Visit (INDEPENDENT_AMBULATORY_CARE_PROVIDER_SITE_OTHER): Payer: Medicare Other | Admitting: General Surgery

## 2014-11-18 ENCOUNTER — Encounter: Payer: Self-pay | Admitting: General Surgery

## 2014-11-18 VITALS — BP 150/82 | HR 72 | Resp 16 | Ht 64.0 in | Wt 243.0 lb

## 2014-11-18 DIAGNOSIS — M25562 Pain in left knee: Secondary | ICD-10-CM | POA: Diagnosis not present

## 2014-11-18 DIAGNOSIS — Z96652 Presence of left artificial knee joint: Secondary | ICD-10-CM | POA: Diagnosis not present

## 2014-11-18 DIAGNOSIS — D0510 Intraductal carcinoma in situ of unspecified breast: Secondary | ICD-10-CM | POA: Diagnosis not present

## 2014-11-18 NOTE — Patient Instructions (Signed)
Patient will be asked to return to the office in one year with a bilateral screening mammogram. 

## 2014-11-18 NOTE — Progress Notes (Signed)
Patient ID: Selena Bush, female   DOB: June 12, 1946, 69 y.o.   MRN: [id number]  Chief Complaint  Patient presents with  . Follow-up    mammogram    HPI Selena Bush is a 69 y.o. female who presents for a breast evaluation. The most recent mammogram was done on 11/07/14 .  Patient does perform regular self breast checks and gets regular mammograms done.    HPI  Past Medical History  Diagnosis Date  . Hypertension   . Diffuse cystic mastopathy 2014  . Anxiety   . Depression   . GERD (gastroesophageal reflux disease)   . Chronic diastolic CHF (congestive heart failure)     a. echo 09/2014: EF 46-80%, diastolic dysfunction, mild LVH, nl RV size & systolic function, mildly dilated LA (4.3 cm), mild MR/TR, mildly elevated PASP 36.7 mm Hg  . HLD (hyperlipidemia)     a. statin intolerant 2/2 myalgias  . Sleep difficulties     LUNESTA HAS HELPED  . OSA on CPAP   . 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 PARTIAL RIGHT MASTECTOMY FOR BREAST CANCER--HAD RADIATION - NO CHEMO --DR. [place] ONCOLOGIST  . Osteoarthritis of both knees     a. s/p right TKA 04/2013 & left TKA 09/2014  . Headache     rare  . DDD (degenerative disc disease), cervical   . DDD (degenerative disc disease), lumbar   . Obesity   . Otitis externa   . Vaginal cyst   . Hypercholesterolemia     Past Surgical History  Procedure Laterality Date  . Colonoscopy  2015     1 benign polyp-every 5 years  . Ercp  1995  . Mastectomy Right 10/19/2012    PARTIAL MASTECTOMY  . Cholecystectomy  1994  . Tubal ligation  1979  . Abdominal hysterectomy  1992  . Total knee arthroplasty Right 04/16/2013    Procedure: RIGHT TOTAL KNEE ARTHROPLASTY;  Surgeon: Mauri Pole, MD;  Location: [facility];  Service: Orthopedics;  Laterality: Right;  . Joint replacement Right Sept 2014    knee  . Breast surgery Right March 2014    lumpectomy RT 10  mm papillary DCIS, ER/PR positive. Sentinel node negative. Partial breast radiation.  . Total knee arthroplasty Left 09/15/2014    Procedure: LEFT TOTAL KNEE ARTHROPLASTY;  Surgeon: Mauri Pole, MD;  Location: [facility];  Service: Orthopedics;  Laterality: Left;    Family History  Problem Relation Age of Onset  . Colon cancer Maternal Grandfather   . Breast cancer Maternal Grandfather   . Colon cancer Mother     Social History History  Substance Use Topics  . Smoking status: Never Smoker   . Smokeless tobacco: Never Used     Comment: social smoker as a teen  . Alcohol Use: No    Allergies  Allergen Reactions  . Codeine     Gi problems   . Penicillins     anaphylaxis    . Statins Other (See Comments)    Leg pains  . Sulfa Antibiotics     anaphylaxis     Current Outpatient Prescriptions  Medication Sig Dispense Refill  . atenolol (TENORMIN) 50 MG tablet Take 50 mg by mouth every morning.     . busPIRone (BUSPAR) 15 MG tablet Take 30 mg by mouth at bedtime.     . fluticasone (FLONASE) 50 MCG/ACT nasal spray Place 1 spray into  the nose daily as needed for rhinitis or allergies.     . furosemide (LASIX) 40 MG tablet Take 20 mg by mouth daily.     [name] lisinopril (PRINIVIL,ZESTRIL) 20 MG tablet Take 20 mg by mouth daily with breakfast.     . LORazepam (ATIVAN) 0.5 MG tablet Take 1 tablet (0.5 mg total) by mouth 2 (two) times daily as needed for anxiety. (Patient taking differently: Take 1 mg by mouth at bedtime. ) 30 tablet 0  . NEXIUM 40 MG capsule Take 40 mg by mouth every morning.     . potassium chloride (MICRO-K) 10 MEQ CR capsule Take 10 mEq by mouth daily.     [name] spironolactone (ALDACTONE) 25 MG tablet Take 25 mg by mouth every morning.    . Suvorexant (BELSOMRA) 20 MG TABS Take 20 mg by mouth at bedtime.    . tamoxifen (NOLVADEX) 20 MG tablet Take 20 mg by mouth at bedtime.     [name] venlafaxine XR (EFFEXOR-XR) 150 MG 24 hr capsule Take 150 mg by mouth daily with breakfast.     . beta carotene 25000 UNIT capsule Take 25,000 Units by mouth at bedtime.     . Calcium-Vitamin D-Vitamin K (VIACTIV) 250-539-76 MG-UNT-MCG CHEW Chew 1 tablet by mouth at bedtime.      No current facility-administered medications for this visit.    Review of Systems Review of Systems  Constitutional: Negative.   Respiratory: Negative.   Cardiovascular: Negative.     Blood pressure 150/82, pulse 72, resp. rate 16, height 5\' 4"  (1.626 m), weight 243 lb (110.224 kg).  Physical Exam Physical Exam  Constitutional: She is oriented to person, place, and time. She appears well-developed and well-nourished.  Eyes: Conjunctivae are normal. No scleral icterus.  Neck: Neck supple.  Cardiovascular: Normal rate, regular rhythm and normal heart sounds.   Pulmonary/Chest: Effort normal and breath sounds normal. Right breast exhibits no inverted nipple, no mass, no nipple discharge, no skin change and no tenderness. Left breast exhibits no inverted nipple, no mass, no nipple discharge, no skin change and no tenderness.    Right breast thickening at 12 o'clock alone the scar.   Abdominal: Soft. Bowel sounds are normal. There is no tenderness.  Lymphadenopathy:    She has no cervical adenopathy.    She has no axillary adenopathy.  Neurological: She is alert and oriented to person, place, and time.  Skin: Skin is warm and dry.    Data Reviewed Bilateral mammograms dated 11/07/2014 completed at [facility] reviewed. Postsurgical changes. BI-RADS-2.  Assessment    Benign breast exam.  Good tolerance of antiestrogen therapy. Patient declines aspirin therapy due to severe reaction with GI distress.    Plan    Patient will be asked to return to the office in one year with a bilateral diagnostic mammogram.      PCP:  Etheleen Mayhew 11/19/2014, 5:45 PM

## 2014-11-19 DIAGNOSIS — D0510 Intraductal carcinoma in situ of unspecified breast: Secondary | ICD-10-CM | POA: Insufficient documentation

## 2014-11-20 DIAGNOSIS — M25562 Pain in left knee: Secondary | ICD-10-CM | POA: Diagnosis not present

## 2014-11-20 DIAGNOSIS — Z96652 Presence of left artificial knee joint: Secondary | ICD-10-CM | POA: Diagnosis not present

## 2014-11-21 DIAGNOSIS — I10 Essential (primary) hypertension: Secondary | ICD-10-CM | POA: Diagnosis not present

## 2014-11-21 DIAGNOSIS — J189 Pneumonia, unspecified organism: Secondary | ICD-10-CM | POA: Diagnosis not present

## 2014-11-21 DIAGNOSIS — R0902 Hypoxemia: Secondary | ICD-10-CM | POA: Diagnosis not present

## 2014-11-21 DIAGNOSIS — I5020 Unspecified systolic (congestive) heart failure: Secondary | ICD-10-CM | POA: Diagnosis not present

## 2014-11-25 DIAGNOSIS — Z96652 Presence of left artificial knee joint: Secondary | ICD-10-CM | POA: Diagnosis not present

## 2014-11-25 DIAGNOSIS — M25562 Pain in left knee: Secondary | ICD-10-CM | POA: Diagnosis not present

## 2014-11-27 DIAGNOSIS — M25562 Pain in left knee: Secondary | ICD-10-CM | POA: Diagnosis not present

## 2014-11-27 DIAGNOSIS — Z96652 Presence of left artificial knee joint: Secondary | ICD-10-CM | POA: Diagnosis not present

## 2014-12-05 NOTE — Op Note (Signed)
PATIENT NAME:  Selena Bush, DIAZ MR#:  [id number] DATE OF BIRTH:  07/23/1946  DATE OF PROCEDURE:  10/18/2012  PREOPERATIVE DIAGNOSIS:  Right breast cancer.   POSTOPERATIVE DIAGNOSIS:  Right breast cancer.  OPERATIVE PROCEDURE:  Wide local excision with sentinel node biopsy, ultrasound guidance.   SURGEON:  Hervey Ard, M.D.   ANESTHESIA:  General by LMA, Marcaine 0.5% plain, 30 mL local infiltration.   ESTIMATED BLOOD LOSS:  25 mL.   CLINICAL NOTE:  This 69 year old woman showed an abnormal mammogram with clustered microcalcifications.  Core biopsy showed evidence of a papillary carcinoma, no clear invasion.  Due to the planned wide excision, it was elected to complete a sentinel node biopsy as this would likely be compromised were it be necessary post wide excision.  The patient was injected with technetium sulfur colloid prior to the procedure.  4 mL of methylene blue diluted 1:2 with normal saline was injected in the subareolar plexus after the induction of anesthesia.  The breast, chest and axilla was prepped with ChloraPrep and draped.  Gamma finder was used to show an area of increased uptake in the lower aspect of the right axilla.  A 6 cm transverse incision was made and carried down through the generous layer of adipose tissue.  Two hot nodes that were not blue were identified.  Frozen section report from Galvin Proffer, M.D., from pathology, reported no evidence of macro metastases.  The wound was closed in layers with 2-0 Vicryl deep and running 4-0 Vicryl subcuticular suture for the skin.   Attention was then turned to the breast.  Ultrasound was used to identify the biopsy cavity in the 12 o'clock position.  Based on the area of tissue distortion and review of her mammograms it was elected to resect a 6 sq cm, 6 x 6 x 4 cm block of tissue.  This was from the level of the adipose tissue down to, but not including the deep fascia.  The specimen was orientated and specimen radiograph  confirmed the previously placed biopsy clip in the center.  Gross examination by Dr. Darryl Lent did not show any clear evidence of disease at the margins.  Dense fibrotic tissue within the breast does make some of this examination difficult.   Good hemostasis was achieved with electrocautery and 3-0 Vicryl ties.  The wound was then closed in layers with 2-0 Vicryl figure-of-eight sutures.  The adipose tissue was approximated with similar sutures and the skin closed with a running 4-0 Vicryl subcuticular suture.  Benzoin and Steri-Strips were applied followed by a Telfa dressing.  Due to the patient's body habitus it was elected not to make use of a compressive wrap, but merely to place her sports bra on her in the recovery room.   The patient tolerated the procedure well.     ____________________________ Robert Bellow, MD jwb:ea D: 10/18/2012 21:10:19 ET T: 10/18/2012 23:42:32 ET JOB#: [id number]  cc: Robert Bellow, MD, <Dictator> Robert Bellow, MD Jerrell Belfast, MD JEFFREY Amedeo Kinsman MD ELECTRONICALLY SIGNED 10/20/2012 11:20

## 2014-12-05 NOTE — Consult Note (Signed)
Reason for Visit: This 69 year old Female patient presents to the clinic for initial evaluation of  breast cancer .   Referred by Dr. Hervey Ard.  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  Pathology Report pathology report reviewed   Imaging Report mammogram and ultrasound reviewed   Referral Report clinical notes reviewed   Planned Treatment Regimen accelerated partial breast irradiation   HPI   patient is a 69 year old female who presents with an abnormal mammogram of the right breast showing irregular nodular density this appeared aspect of the right breast confirmed on ultrasound. She underwent needle biopsy positive for ductal carcinoma in situ. Underwent wide local excision and sentinel node biopsy for approximately 1 cm focus of ductal carcinoma in situ margins were clear but close. Sentinel node biopsy was negative. Tumor was strongly ER/PR positive and tumor was grade 1. Patient has morbid obesity and difficulty with arthritis in her knees making ambulation difficult. She is referred to radiation oncology for opinion. For breast and point she's doing well incision is well-healed no tendernessof the right breast is noted.  Past Hx:    Breast Cancer, Right Breast:    Hypertension:    Cholecystectomy:    Hysterectomy - Total:   Past, Family and Social History:  Past Medical History positive   Cardiovascular hypertension   Gastrointestinal GERD   Past Surgical History cholecystectomy; hysterectomy   Past Medical History Comments morbid obesity, arthritis   Family History positive   Family History Comments maternal grandfather with colon cancer mother with colon cancer father with melanoma   Social History positive   Social History Comments 30-pack-year smoking history quit smoking 45 years prior    Additional Past Medical and Surgical History seen by herself today   Allergies:   Codeine: N/V  Penicillin: Swelling, Rash  Sulfa drugs: Anaphylaxis, Hives  Home Meds:  Home Medications: Medication Instructions Status  amlodipine 5 mg oral tablet 1 tab(s) orally once a day at hs Active  vitamin E 400 intl units oral capsule 1 cap(s) orally once a day at hs Active  Advil Liquigel 200 mg oral capsule 2 cap(s) orally 2 times a day Active  red yeast rice 2 tab(s) orally 2 times a day Active  Fish Oil 1000 mg oral capsule 1 cap(s) orally 2 times a day Active  Pennsaid 1.5% topical solution Apply topically to affected area 4 times a day Active  Ester-C 1000 mg oral tablet 1 tab(s) orally once a day at hs Active  atenolol 50 mg oral tablet 1 tab(s) orally once a day in am Active  hydrochlorothiazide-triamterene 25 mg-37.5 mg oral capsule 1 cap(s) orally once a day in am Active  Lunesta 2 mg oral tablet 1 tab(s) orally once a day (at bedtime) Active  busPIRone 15 mg oral tablet 2 tab(s) orally once a day (at bedtime) as needed for sleep. Active  DULoxetine 60 mg oral delayed release capsule 1 cap(s) orally once a day Active  NexIUM 40 mg oral delayed release capsule 1 cap(s) orally once a day Active  lisinopril 20 mg oral tablet 1 tab(s) orally once a day Active   Review of Systems:  General negative   Performance Status (ECOG) 1   Skin negative   Breast see HPI   Ophthalmologic negative   ENMT negative   Respiratory and Thorax negative   Cardiovascular  negative   Gastrointestinal negative   Genitourinary negative   Musculoskeletal negative   Neurological negative   Psychiatric negative   Hematology/Lymphatics negative   Endocrine negative   Allergic/Immunologic negative   Review of Systems   according to nurse's notesPatient denies any weight loss, fatigue, weakness, fever, chills or night sweats. Patient denies any loss of vision, blurred vision. Patient denies  any ringing  of the ears or hearing loss. No irregular heartbeat. Patient denies heart murmur or history of fainting. Patient denies any chest pain or pain radiating to her upper extremities. Patient denies any shortness of breath, difficulty breathing at night, cough or hemoptysis. Patient denies any swelling in the lower legs. Patient denies any nausea vomiting, vomiting of blood, or coffee ground material in the vomitus. Patient denies any stomach pain. Patient states has had normal bowel movements no significant constipation or diarrhea. Patient denies any dysuria, hematuria or significant nocturia. Patient denies any problems walking, swelling in the joints or loss of balance. Patient denies any skin changes, loss of hair or loss of weight. Patient denies any excessive worrying or anxiety or significant depression. Patient denies any problems with insomnia. Patient denies excessive thirst, polyuria, polydipsia. Patient denies any swollen glands, patient denies easy bruising or easy bleeding. Patient denies any recent infections, allergies or URI. Patient "s visual fields have not changed significantly in recent time.  Nursing Notes:  Nursing Vital Signs and Chemo Nursing Nursing Notes: *CC Vital Signs Flowsheet:   31-Mar-14 09:00  Temp Temperature 98.1  Pulse Pulse 72  Respirations Respirations 20  SBP SBP 114  DBP DBP 71  Pain Scale (0-10)  0  Current Weight (kg) (kg) 119.8  Height (cm) centimeters 153.5  BSA (m2) 2.1   Physical Exam:  General/Skin/HEENT:  Skin normal   Eyes normal   ENMT normal   Head and Neck normal   Additional PE well-developed obese female with difficulty ambulating in NAD. Lungs are clear to A&P cardiac examination shows regular rate and rhythm. Right breast status post wide local excision. Incision is well-healed. Sentinel node incision is well-healed. No dominant mass or nodularity is noted in either breast into position examined. No axillary or  supraclavicular adenopathy is appreciated.   Breasts/Resp/CV/GI/GU:  Respiratory and Thorax normal   Cardiovascular normal   Gastrointestinal normal   Genitourinary normal   MS/Neuro/Psych/Lymph:  Musculoskeletal normal   Neurological normal   Lymphatics normal   Other Results:  Radiology Results: [place]:    03-Feb-14 10:19, US Breast Right  US Breast Right   REASON FOR EXAM:    AV RT FOCAL ASYMMETRY  COMMENTS:       PROCEDURE: [place]  - US BREAST RIGHT  - Sep 17 2012 10:19AM     RESULT:     Comparisons: 07/26/2011, 07/07/2010, and 07/06/2009.    Findings:    The breast tissue is heterogeneously dense, which may lower the   sensitivity of mammography. True lateral and spot compression   magnification views were performed to evaluate the findings in the right   CC and MLO views on the screening mammograms. The spot compression     magnification views demonstrate an irregular mass in the superior aspect   of the right breast at approximately 12:00. Much of the borders are   obscured by overlapping fibroglandular tissue. However, some borders are   well-circumscribed while others are ill-defined. There are numerous other   round masses on these views throughout the right breast which are similar  to prior studies and likely represent cysts given their number and   similarity to prior studies.     Real time ultrasound was performed of the right breast from 1:00 through   11:00. This demonstrated an irregular hypoechoic mass in the right breast   at 2:00 approximately 5 cm from the nipple. It measures 1.8 x 1.4 x 1.1   cm. The borders are irregular. There is internal color Doppler flow. At   11:00, there are two small, round masses which appear anechoic and likely   represent cysts. There is suggestion of posterior acoustic through   transmission in these masses.  IMPRESSION:    BI-RADS: Category 4 - Suspicious Abnormality.    BREAST COMPOSITION: The breast composition  is HETEROGENEOUSLY DENSE   (glandular tissue is 51-75%) This may decrease the sensitivity of   mammography.      Surgical consultation and tissue diagnosis are recommended for the   suspicious solid mass in the superior right breast.      Thank you for the opportunity to contribute to the care of your patient.       Verified By: Gregor Hams, M.D., MD  LabUnknown:    03-Feb-14 09:17, Digital Additional Views Rt Breast [facility])  PACS Image     03-Feb-14 10:19, US Breast Right  PACS [place]:    03-Feb-14 09:17, Digital Additional Views Rt Breast (SCR)  Digital Additional Views Rt Breast (SCR)   REASON FOR EXAM:    AV RT FOCAL ASYMMETRY  COMMENTS:       PROCEDURE: MAM - MAM DIG ADDVIEWS RT SCR  - Sep 17 2012  9:17AM     RESULT:     Comparisons: 07/26/2011, 07/07/2010, and 07/06/2009.    Findings:    The breast tissue is heterogeneously dense, which may lower the   sensitivity of mammography. True lateral and spot compression   magnification views were performed to evaluate the findings in the right   CC and MLO views on the screening mammograms. The spot compression     magnification views demonstrate an irregular mass in the superior aspect   of the right breast at approximately 12:00. Much of the borders are   obscured by overlapping fibroglandular tissue. However, some borders are   well-circumscribed while others are ill-defined.There are numerous other   round masses on these views throughout the right breast which are similar   to prior studies and likely represent cysts given their number and   similarity to prior studies.     Real time ultrasound was performed of the right breast from 1:00 through   11:00. This demonstrated an irregular hypoechoic mass in the right breast   at 2:00 approximately 5 cm from the nipple. It measures 1.8 x 1.4 x 1.1   cm. The borders are irregular. There is internal color Doppler flow. At  11:00, there are two small,  round masses which appear anechoic and likely   represent cysts. There is suggestion of posterior acoustic through   transmission in these masses.  IMPRESSION:    BI-RADS: Category 4 - Suspicious Abnormality.    BREAST COMPOSITION: The breast composition is HETEROGENEOUSLY DENSE   (glandular tissue is 51-75%) This may decrease the sensitivity of   mammography.      Surgical consultation and tissue diagnosis are recommended for the   suspicious solid mass in the superior right breast.    A NEGATIVE MAMMOGRAM REPORT DOES NOT PRECLUDE BIOPSY OR  OTHER EVALUATION   OF A CLINICALLY PALPABLE OR OTHERWISE SUSPICIOUS MASS OR LESION. BREAST   CANCER MAY NOT BE DETECTED BY MAMMOGRAPHY IN UP TO 10% OF CASES.  Thank you for the opportunity to contribute to the care of your patient.         Verified By: Gregor Hams, M.D., MD   Relevent Results:   Relevant Scans and Labs ultrasound and mammograms reviewed   Assessment and Plan: Impression:   ductal carcinoma in situ status post wide local excision of the right breast ER/PR positive in 69 year old female Plan:   at this time I've gone over the risks and benefits of adjuvant radiation therapy for her ductal carcinoma in situ of the right breast. I got over a treatment option of whole breast radiation versus accelerated partial breast irradiation. Based on her limited mobility of obesity and arthritis believe she would be an excellent candidate for MammoSite catheter placement and afterloading with a radium 192 to deliver another 3400 cGy to tissue surrounding the MammoSite balloon. Risks and benefits the procedure were discussed with the patient. Side effects including desquamation the skin, erythema the skin, thickening of the breast lumpectomy site, and fatigue were all explained in detail to the patient. Should we not be able to perform MammoSite catheter placement would go with a short course of whole breast radiation therapy. I set patient up  for reevaluation MammoSite catheter placement by Dr. Tollie Pizza. We'll coordinate treatment planning and her treatments after placement of the catheter.  I would like to take this opportunity to thank you for allowing me to continue to participate in this patient's care.  CC Referral:  cc: Dr. Hervey Ard, Dr. Margarita Rana   Electronic Signatures: Baruch Gouty, Roda Shutters (MD)  (Signed 31-Mar-14 14:36)  Authored: HPI, Diagnosis, Past Hx, PFSH, Allergies, Home Meds, ROS, Nursing Notes, Physical Exam, Other Results, Relevent Results, Encounter Assessment and Plan, CC Referring Physician   Last Updated: 31-Mar-14 14:36 by Armstead Peaks (MD)

## 2014-12-08 LAB — SURGICAL PATHOLOGY

## 2014-12-09 DIAGNOSIS — R739 Hyperglycemia, unspecified: Secondary | ICD-10-CM | POA: Insufficient documentation

## 2014-12-09 DIAGNOSIS — F32A Depression, unspecified: Secondary | ICD-10-CM

## 2014-12-09 DIAGNOSIS — K219 Gastro-esophageal reflux disease without esophagitis: Secondary | ICD-10-CM

## 2014-12-09 DIAGNOSIS — F329 Major depressive disorder, single episode, unspecified: Secondary | ICD-10-CM | POA: Insufficient documentation

## 2014-12-09 DIAGNOSIS — G4730 Sleep apnea, unspecified: Secondary | ICD-10-CM

## 2014-12-09 DIAGNOSIS — G4733 Obstructive sleep apnea (adult) (pediatric): Secondary | ICD-10-CM | POA: Insufficient documentation

## 2014-12-10 DIAGNOSIS — I340 Nonrheumatic mitral (valve) insufficiency: Secondary | ICD-10-CM | POA: Insufficient documentation

## 2014-12-14 NOTE — Discharge Summary (Signed)
PATIENT NAME:  Selena Bush, Selena Bush MR#:  [id number] DATE OF BIRTH:  13-Jun-1946  DATE OF ADMISSION:  09/21/2014 DATE OF DISCHARGE:  09/22/2014  PRESENTING COMPLAINT: Shortness of breath.   DISCHARGE DIAGNOSES:  1.  Acute diastolic congestive heart failure, new diagnosis.  2.  Possible early pneumonia.  3.  Hypertension.  4.  Saturations greater than 96% on 2 liters nasal cannula oxygen, which has been set up.   CODE STATUS: Full code.    MEDICATIONS:  1.  Atenolol 50 mg p.o. daily.  2.  Buspirone 15 mg 2 tablets once a day at bedtime as needed.  3.  Nexium 40 mg daily.  4.  Lisinopril 20 mg daily.  5.  Fish oil 1000 mg daily at bedtime.  6.  Aspirin 325 mg b.i.d.  7.  Belsomra 20 mg p.o. daily at bedtime.  8.  Beta-carotene 25,000 units p.o. daily at bedtime.  9.  Docusate 100 mg b.i.d.  10.  Ferrous sulfate 325 mg p.o. t.i.d.  11.  Fluticasone 50 mcg per inhalation daily as needed.  12.  Glucosamine and chondroitin with MSM. 13.  Hydromorphone 2 mg 1-2 every 4 hours as needed.  14.  Lorazepam 0.5 mg p.o. daily b.i.d.   15.  MiraLax p.o. b.i.d.  16.  K-Dur 10 mEq p.o. b.i.d.  17.  Xarelto 10 mg p.o. daily.  18.  Spironolactone 25 mg daily.  19.  Tamoxifen 20 mg at bedtime.  20.  Tizanidine 4 mg every 6 hourly.  21.  Venlafaxine extended release 150 mg daily.  22.  Viactiv soft calcium chews once at bedtime.  23.  Lasix 20 mg daily.  24.  Levaquin 500 mg daily.   DISCHARGE INSTRUCTIONS: Resume physical therapy as before. Nasal cannula oxygen 2 liters.   FOLLOWUP:  1.  With Dr. Venia Bush in 1 -2 weeks.  2.  Follow up with cardiology in [place], Selena Burow, MD. 3.  Follow up with Selena Bush at [facility].  DISCHARGE DATA:  1.  Creatinine at discharge is 1.04, potassium is 3.6.  2.  Echocardiogram shows EF of 60%.  3.  Doppler bilateral lower extremity negative for DVT.   BRIEF SUMMARY OF HOSPITAL COURSE: Selena Bush is a 69 year old obese Caucasian female with past  medical history of hypertension, degenerative disk disease who recently had left knee replacement about a week ago, comes to the Emergency Room with increasing shortness of breath, was found to be hypoxic with saturations in the 80s. She was admitted with:  1.  Acute hypoxic respiratory failure secondary to possible acute congestive heart failure, diastolic, along with possible early pneumonia. The patient was started on IV Lasix. She diuresed well. She requires oxygen and has been set up with 2 liter nasal cannula oxygen. Her EF is 60%. She will follow up with her primary cardiologist, Dr. Quay Bush, in [place]. Her Lasix was changed to p.o. 20 mg daily.  2.  Pneumonia. She will finish up a course of Levaquin. Blood cultures remain negative. White count was normal.  3.  Hypertension. Home medications were resumed.  4.  Status post left knee replacement about a week ago. The patient is on anticoagulation with Xarelto.  5.  Anemia, remained stable.  6.  GI prophylaxis. Continue Protonix.  Hospital stay otherwise remained stable. The patient remained a full code.   TIME SPENT: Forty minutes.     ____________________________ Selena Rochester Posey Pronto, MD sap:TM D: 09/22/2014 14:27:19 ET T: 09/22/2014 15:10:53 ET JOB#: [id number]  cc: Selena Ricci A. Posey Pronto, MD, <Dictator> Selena Basset MD ELECTRONICALLY SIGNED 10/14/2014 17:25

## 2014-12-14 NOTE — H&P (Signed)
PATIENT NAME:  Selena Bush, HASEMAN MR#:  [id number] DATE OF BIRTH:  25-Jun-1946  DATE OF ADMISSION:  09/21/2014  REFERRING DOCTOR: Lisa Roca  PRIMARY CARE PRACTITIONER: Jerrell Belfast, MD.  ADMITTING DOCTOR: Juluis Mire, MD   CHIEF COMPLAINT: Acute respiratory distress.  HISTORY OF PRESENT ILLNESS: A 69 year old Caucasian female with a history of hypertension, recent left knee replacement done on last Monday, degenerative disk disease, sleep apnea on CPAP, history of breast cancer status post right lumpectomy, who was brought by the EMS with the complaints of acute respiratory distress which she noted while she was at home today evening and was noted to have low oxygen saturations of around 85% and she was placed on BiPAP by the EMS and brought to the Emergency Room for further evaluation. In the Emergency Room, the patient was noted to be in acute respiratory distress with hypoxia and was maintained on BiPAP, and workup revealed acute congestive heart failure. The patient was given IV furosemide 40 mg, following which she diuresed. She was continued on oxygen supplementation, following which her respiratory status improved, and she is currently maintained on oxygen supplementation through nasal cannula. Denies any chest pain. No dizziness. No palpitations. No loss of consciousness. She does have some low-grade fever of about 100 degrees but denies any cough. No wheezing. No nausea. No vomiting. No diarrhea. No dysuria. The patient was also noted to have a rectal temperature of 100.3 degrees Fahrenheit in the Emergency Room. The patient subsequently underwent a CT angiogram of the chest which was negative for acute pulmonary embolism but positive for some linear patchy air-space opacity in the left lower lobe which could be atelectasis versus pneumonia. The patient is comfortably resting in bed at this time on oxygen supplementation and denies any complaints such as shortness of breath, cough, chest  pain. She stated that she is feeling tired.  PAST MEDICAL HISTORY:  1.  Hypertension.  2.  Recent left knee replacement done on last Monday.  3.  History of degenerative disk disease.  4.  Right breast cancer, right side status post right lumpectomy.  5.  Sleep apnea on CPAP.  6.  Chronic anemia.   PAST SURGICAL HISTORY:  1.  Cholecystectomy.  2.  Hysterectomy.  3.  Right knee replacement.  4.  Status post right breast lumpectomy.   ALLERGIES: 1.  SULFA.  2.  CODEINE.  3.  PENICILLIN.  4.  STATINS.   FAMILY HISTORY: Father with skin cancer. Mother with colon cancer. No history of any high blood pressure, heart disease, or COPD.   SOCIAL HISTORY: She is married, lives with her husband at home. No history of smoking, alcohol, or substance abuse.   Home meds : Reviewed list.  REVIEW OF SYSTEMS: CONSTITUTIONAL: Positive for mild fever of 100 degrees Fahrenheit. Otherwise, no chills, no fatigue, no generalized weakness.  EYES: Negative for blurred vision, double vision. No pain. No redness. No discharge.  EARS, NOSE, AND THROAT: Negative for tinnitus, ear pain, hearing loss, epistaxis, nasal discharge, difficulty swallowing.  RESPIRATORY: Positive for acute respiratory distress with hypoxia as noted in the history of present illness. Negative for cough. No painful respirations. No hemoptysis. No wheezing.  CARDIOVASCULAR: Positive for acute respiratory distress with shortness of breath. Negative for chest pain. No dizziness or syncopal episodes. She does have some left lower extremity edema following her recent left knee replacement.  GASTROINTESTINAL: Negative for nausea, vomiting, diarrhea, constipation, abdominal pain, hematemesis, melena, rectal bleeding, GERD symptoms.  GENITOURINARY:  Negative for dysuria, frequency, urgency, or hematuria.  ENDOCRINE: Negative for polyuria, nocturia, heat or cold intolerance.  HEMATOLOGIC AND LYMPHATIC: Negative for easy bruising, bleeding,  swollen glands.  INTEGUMENTARY: Negative for acne, skin rash, or lesions.  MUSCULOSKELETAL: History of degenerative joint disease status post left knee replacement done recently about 1 week ago. NEUROLOGICAL: Negative for focal weakness, numbness. No history of CVA, TIA, seizure disorder.  PSYCHIATRIC: Positive for depression/anxiety disorder, stable on home medications.   PHYSICAL EXAMINATION:  VITAL SIGNS: Temperature 98.7 degrees Fahrenheit, pulse rate 87 per minute, respirations 28 per minute, blood pressure on arrival 189/87, oxygen saturation 100% on BiPAP, current oxygen saturation is 95% on nasal cannula.  GENERAL: Well developed, well nourished, alert, in no acute distress at this time, comfortably resting in the bed, pleasant and cooperative.  HEAD: Atraumatic, normocephalic.  EYES: Pupils equal and reactive to light and accommodation. No conjunctival pallor. No icterus. Extraocular movements intact.  NOSE: No drainage. No lesions. EARS: No drainage. No external lesions.  ORAL CAVITY: No mucosal lesions. No exudates.  NECK: Supple. No JVD. No thyromegaly. No carotid bruit. Range of motion of neck within normal limits.  RESPIRATORY: Good respiratory effort. Not using accessory muscles of respiration. Bilateral air entry present. Bilateral few rales at bases present. No rhonchi.  CARDIOVASCULAR: S1, S2 regular. No murmurs, gallops, or clicks. Peripheral pulses equal at carotid, femoral, and pedal pulses. Left lower extremity edema present.  GASTROINTESTINAL: Abdomen soft, obese, nontender. No hepatosplenomegaly. No masses. No rigidity. No guarding. Bowel sounds present and equal in all 4 quadrants.  GENITOURINARY: Deferred.  MUSCULOSKELETAL: Recent surgical incision covered with a bandage over her left knee. Decreased range of motion of left knee secondary to recent surgery. Otherwise, strength normal.  SKIN: On inspection mildly bruising over the left lower thigh.  VASCULAR: Good  dorsalis pedis and posterior tibial pulses.  NEUROLOGICAL: Alert, awake, and oriented x 3. Cranial nerves II through XII grossly intact. No sensory deficit. Motor strength 5/5 in both upper and lower extremities. DTRs 2+ bilateral symmetrical.  PSYCHIATRIC: Alert, awake, and oriented x 3. Judgment and insight adequate. Memory and mood within normal limits.   ANCILLARY DATA:  LABORATORIES: Serum glucose 120, BNP 3302, BUN 10, creatinine 0.91, sodium 135, potassium 3.9, chloride 100, bicarbonate 28, total calcium 8.5. Troponin 0.03. WBC 10.5, hemoglobin 9.0, hematocrit 27.8, platelet count 245,000. Urinalysis unremarkable.   IMAGING STUDIES: Chest x-ray: Heart size mild enlargement. Central vascular congestion. Lower lung volumes with patchy bilateral air-space opacities which could represent atelectasis.   CT angiogram of the chest: 1.  No pulmonary embolus.  2.  Linear and patchy opacity in the left lower lobe. This is slightly greater than expected for atelectasis and may reflect pneumonia.  DVT studies of bilateral lower extremities negative for any deep vein thrombosis.   EKG: Undetermined rhythm with a ventricular rate of 80 beats per minute, nonspecific ST-T changes.  ASSESSMENT AND PLAN: A 69 year old Caucasian female with a history of hypertension, degenerative disk disease status post recent left knee replacement done about 1 week ago, sleep apnea on continuous positive airway pressure, presents with acute onset of shortness of breath and hypoxia, placed on BiPAP by EMS for oxygen saturations of around 85%, found to have acute congestive heart failure of unclear etiology. Chest x-ray significant for pulmonary vascular congestion and CT angiogram negative for pulmonary embolism but positive for linear patchy air-space opacity, could be atelectasis versus pneumonia.  1.  Acute respiratory failure with hypoxia secondary to  acute congestive heart failure, which is new onset, systolic versus  diastolic. 2.  Acute congestive heart failure, new onset, systolic versus diastolic. Etiology not known. Rule out acute coronary syndrome, rule out arrhythmias.  3.  Linear patchy air-space opacity of left lower lobe of the lung, which is atelectasis versus pneumonia. No history of fever or cough. White blood cell count normal, but rectal temperature in the Emergency Department was 100 degrees Fahrenheit. Possible pneumonia. Plan: Admit to telemetry. Continue oxygen supplementation, IV furosemide, aspirin, ACE inhibitor. Cycle cardiac enzymes. Echocardiogram and cardiology consultation requested for further cardiac workup. Blood and sputum cultures obtained, and we will start on IV Levaquin for likely pneumonia.  4.  Hypertension, stable on home medications. Continue same and follow blood pressure monitoring and adjust medications accordingly.  5.  Status post left knee replacement done about 1 week ago. No acute symptoms. Continue p.r.n. pain medications. Physical therapy consultation. Continue anticoagulation.  6.  History of sleep apnea on continuous positive airway pressure. Continue same.  7.  Anemia. The patient not taking iron tablets at this time. Check serum iron levels. Monitor CBC and further workup accordingly.  8.  Deep vein thrombosis prophylaxis. Continue Xarelto. 9.  Gastrointestinal prophylaxis. Proton pump inhibitor.  CODE STATUS: Full code.   TIME SPENT: 55 minutes.   ____________________________ Juluis Mire, MD enr:ST D: 09/21/2014 01:20:00 ET T: 09/21/2014 01:51:30 ET JOB#: [id number]  cc: Jerrell Belfast, MD Juluis Mire, MD, <Dictator>    Juluis Mire MD ELECTRONICALLY SIGNED 09/21/2014 15:56

## 2014-12-14 NOTE — Consult Note (Signed)
General Aspect Chief Complaint:  dyspnea   Present Illness The patient has a history of HTN.  She has seen Dr. Allyson Sabal for treatment of this.  She did have a stress perfusion study in 07/2014 which was negative for ischemia and which demonstrated a well preserved EF.  She had this prior to knee surgery.  She had knee surgery a few days ago.  She presents now with acute SOB.  She reports that this was happening slowly.  She was not having chest pain, neck or arm pain.  She was not having PND or orthopnea.  She did have some mild leg edema.  She noted that after surgery she was very SOB with mild activity.  She presented when she finally got to the point of having SOB at rest.  EKG demonstrated no acute changes.  Enzymes have been negative.  She feels better this morning.    PMH:  Breast CA, DDD, HTN, Sleep apnea, GERD, Hyperlipidemia  PSH:  Partial mastectomy, Right TKR, Left TKR, hyperlipidemia, hysterectomy, cholecystectomy, lumpectomy  FH:  Mother with colon CA,  Father with skin CA.  No history of early CAD.  Social:  She is married.  She never smoked cigarettes.   Physical Exam:  GEN well developed   HEENT poor dentition   NECK supple  No masses  trachea midline  no JVD   RESP normal resp effort   CARD Regular rate and rhythm  No murmur   ABD denies tenderness  normal BS   LYMPH negative neck   EXTR positive edema, Mild with bruising of the right knee   SKIN No rashes   NEURO cranial nerves intact, motor/sensory function intact   PSYCH alert, good insight   Review of Systems:  Review of Systems: All other systems were reviewed and found to be negative   Home Medications: Medication Instructions Status  atenolol 50 mg oral tablet 1 tab(s) orally once a day in am Active  busPIRone 15 mg oral tablet 2 tab(s) orally once a day (at bedtime) as needed for sleep. Active  NexIUM 40 mg oral delayed release capsule 1 cap(s) orally once a day Active  lisinopril 20 mg oral tablet  1 tab(s) orally once a day Active  Fish Oil 1000 mg oral capsule 1 cap(s) orally once a day (at bedtime) Active  Aspirin Enteric Coated 325 mg oral delayed release tablet 1 tab(s) orally 2 times a day Active  Belsomra 20 mg oral tablet 1 tab(s) orally once a day (at bedtime) Active  beta-carotene 85739 units oral capsule 1 cap(s) orally once a day (at bedtime) Active  docusate sodium 100 mg oral capsule 1 cap(s) orally 2 times a day Active  ferrous sulfate 325 mg oral tablet 1 tab(s) orally 3 times a day Active  fluticasone nasal 50 mcg/inh nasal spray 1 spray(s) nasal once a day, As Needed Active  furosemide 40 mg oral tablet 1 tab(s) orally 2 times a day Active  Glucosamine & Chondroitin with MSM  Active  HYDROmorphone 2 mg oral tablet 1-2 tab(s) orally every 4 hours -for Pain Active  LORazepam 0.5 mg oral tablet 1 tab(s) orally 2 times a day Active  polyethylene glycol 3350 - oral powder for reconstitution  orally 2 times a day Active  Micro-K 10 mEq oral capsule, extended release 1 cap(s) orally 2 times a day Active  rivaroxaban 10 mg oral tablet 1 tab(s) orally once a day Active  spironolactone 25 mg oral tablet 1 tab(s) orally  once a day Active  tamoxifen 20 mg oral tablet 1 tab(s) orally once a day (at bedtime) Active  tiZANidine 4 mg oral tablet 1 tab(s) orally every 6 hours Active  venlafaxine extended release 150 mg oral tablet, extended release 1 tab(s) orally once a day Active  Viactiv Soft Calcium Chews Calcium with Vitamin D and K oral tablet, chewable 1 tab(s) orally once a day (at bedtime) Active   Lab Results: Routine Micro:  06-Feb-16 21:13   Micro Text Report BLOOD CULTURE   COMMENT                   NO GROWTH IN 8-12 HOURS   ANTIBIOTIC                       Micro Text Report BLOOD CULTURE   COMMENT                   NO GROWTH IN 8-12 HOURS   ANTIBIOTIC                       Culture Comment NO GROWTH IN 8-12 HOURS  Result(s) reported on 21 Sep 2014 at 05:00AM.   Culture Comment NO GROWTH IN 8-12 HOURS  Result(s) reported on 21 Sep 2014 at 05:00AM.  Routine Chem:  06-Feb-16 20:55   Iron Binding Capacity (TIBC) 375  Unbound Iron Binding Capacity 330  Iron, Serum  45  Iron Saturation 12 (Result(s) reported on 21 Sep 2014 at 01:33AM.)  Ferritin ([facility]) 53 (Result(s) reported on 21 Sep 2014 at 01:46AM.)  B-Type Natriuretic Peptide [facility])  3302 (Result(s) reported on 20 Sep 2014 at 10:34PM.)  Glucose, Serum  120  BUN 10  Creatinine (comp) 0.91  Sodium, Serum  135  Potassium, Serum 3.9  Chloride, Serum 100  CO2, Serum 28  Calcium (Total), Serum 8.5  Anion Gap 7  Osmolality (calc) 270  eGFR (African American) >60  eGFR (Non-African American) >60 (eGFR values <36mL/min/1.73 m2 may be an indication of chronic kidney disease (CKD). Calculated eGFR, using the MRDR Study equation, is useful in  patients with stable renal function. The eGFR calculation will not be reliable in acutely ill patients when serum creatinine is changing rapidly. It is not useful in patients on dialysis. The eGFR calculation may not be applicable to patients at the low and high extremes of body sizes, pregnant women, and vegetarians.)  Cardiac:  06-Feb-16 20:55   Troponin I 0.03 (0.00-0.05 0.05 ng/mL or less: NEGATIVE  Repeat testing in 3-6 hrs  if clinically indicated. >0.05 ng/mL: POTENTIAL  MYOCARDIAL INJURY. Repeat  testing in 3-6 hrs if  clinically indicated. NOTE: An increase or decrease  of 30% or more on serial  testing suggests a  clinically important change)  Routine UA:  06-Feb-16 20:55   Color (UA) Straw  Clarity (UA) Clear  Glucose (UA) Negative  Bilirubin (UA) Negative  Ketones (UA) Trace  Specific Gravity (UA) 1.008  Blood (UA) Negative  pH (UA) 7.0  Protein (UA) Negative  Nitrite (UA) Negative  Leukocyte Esterase (UA) Negative (Result(s) reported on 20 Sep 2014 at 09:44PM.)  RBC (UA) NONE SEEN  WBC (UA) <1 /HPF  Bacteria (UA) TRACE   Epithelial Cells (UA) NONE SEEN  Mucous (UA) PRESENT (Result(s) reported on 20 Sep 2014 at 09:44PM.)  Routine Hem:  06-Feb-16 20:55   WBC (CBC) 10.5  RBC (CBC)  3.18  Hemoglobin (CBC)  9.0  Hematocrit (CBC)  27.8  Platelet Count (CBC) 245 (Result(s) reported on 20 Sep 2014 at 09:17PM.)  MCV 87  MCH 28.3  MCHC 32.4  RDW  15.3   EKG:  EKG Interp. by me   Interpretation NSR, rate 80, axis WNL, intervals, WNL, no acute ST T wave chagnes.  Poor anterior R wave progression.   Radiology Results: XRay:    06-Feb-16 21:26, Chest Portable Single View  Chest Portable Single View   REASON FOR EXAM:    DYSPNEA  COMMENTS:       PROCEDURE: DXR - DXR PORTABLE CHEST SINGLE VIEW  - Sep 20 2014  9:26PM     CLINICAL DATA:  Difficulty breathing    EXAM:  PORTABLE CHEST - 1 VIEW    COMPARISON:  None.    FINDINGS:  Heart size is mildly enlarged with unfolding/ ectasia of the aorta.  Central vascular congestion is evident. Detail obscured by patient  body habitus, with suboptimal aeration and patchy bibasilar airspace  opacities. No pleural effusion. No acute osseous finding.     IMPRESSION:  Low lung volumes with patchy bibasilar airspace opacities which  could represent atelectasis but would be better evaluated at PA and  lateral chest radiographs obtained at full inspiration when the  patient is clinically able.      Electronically Signed    By: Conchita Paris M.D.    On: 09/20/2014 21:42       Verified By: Arline Asp, M.D.,    Codeine: N/V  Penicillin: Swelling, Rash  Sulfa drugs: Anaphylaxis, Hives  Statins: Other  Vital Signs/Nurse's Notes: **Vital Signs.:   07-Feb-16 11:30  Vital Signs Type Routine  Temperature Temperature (F) 99.3  Celsius 37.3  Pulse Pulse 79  Respirations Respirations 19  Systolic BP Systolic BP 340  Diastolic BP (mmHg) Diastolic BP (mmHg) 84  Mean BP 100  Pulse Ox % Pulse Ox % 91  Pulse Ox Activity Level  At rest  Oxygen Delivery  Room Air/ 21 %    Impression ACUTE CHF:  I suspect that this is related to diastolic dysfunction.  An echo is planned.  There has been no suggestion of an acute coronary syndrome.  I would not suggest an ischemia work up.  Rather she I talked to her and her family for quite a while about volume and salt management.  I agree with continued IV Lasix today and then she can likely transition back to PO in the AM.  HTN:  Her blood pressure seems to be controlled on the current meds.  I would continue the outpatient meds at discharge.  Again, lifestyle changes will be key.  OBESITY:  We discussed at length diet and exercise plans to suit her situation.   Electronic Signatures: Minus Breeding (MD)  (Signed 07-Feb-16 13:13)  Authored: General Aspect/Present Illness, History and Physical Exam, Review of System, Home Medications, Labs, EKG , Radiology, Allergies, Vital Signs/Nurse's Notes, Impression/Plan   Last Updated: 07-Feb-16 13:13 by Minus Breeding (MD)

## 2014-12-19 DIAGNOSIS — I5032 Chronic diastolic (congestive) heart failure: Secondary | ICD-10-CM | POA: Diagnosis not present

## 2014-12-19 DIAGNOSIS — F329 Major depressive disorder, single episode, unspecified: Secondary | ICD-10-CM | POA: Diagnosis not present

## 2014-12-19 DIAGNOSIS — I10 Essential (primary) hypertension: Secondary | ICD-10-CM | POA: Diagnosis not present

## 2014-12-20 ENCOUNTER — Other Ambulatory Visit: Payer: Self-pay | Admitting: General Surgery

## 2014-12-21 DIAGNOSIS — J189 Pneumonia, unspecified organism: Secondary | ICD-10-CM | POA: Diagnosis not present

## 2014-12-21 DIAGNOSIS — I5020 Unspecified systolic (congestive) heart failure: Secondary | ICD-10-CM | POA: Diagnosis not present

## 2014-12-21 DIAGNOSIS — R0902 Hypoxemia: Secondary | ICD-10-CM | POA: Diagnosis not present

## 2014-12-21 DIAGNOSIS — I10 Essential (primary) hypertension: Secondary | ICD-10-CM | POA: Diagnosis not present

## 2015-01-02 ENCOUNTER — Encounter: Payer: Self-pay | Admitting: Cardiovascular Disease

## 2015-01-02 ENCOUNTER — Ambulatory Visit (INDEPENDENT_AMBULATORY_CARE_PROVIDER_SITE_OTHER): Payer: Medicare Other | Admitting: Cardiovascular Disease

## 2015-01-02 VITALS — BP 140/80 | HR 65 | Ht 64.0 in | Wt 246.5 lb

## 2015-01-02 DIAGNOSIS — I5032 Chronic diastolic (congestive) heart failure: Secondary | ICD-10-CM

## 2015-01-02 DIAGNOSIS — E785 Hyperlipidemia, unspecified: Secondary | ICD-10-CM

## 2015-01-02 DIAGNOSIS — I10 Essential (primary) hypertension: Secondary | ICD-10-CM

## 2015-01-02 DIAGNOSIS — E669 Obesity, unspecified: Secondary | ICD-10-CM | POA: Diagnosis not present

## 2015-01-02 NOTE — Assessment & Plan Note (Signed)
We have encouraged continued exercise, careful diet management in an effort to lose weight. 

## 2015-01-02 NOTE — Progress Notes (Signed)
Patient ID: Selena Bush, female    DOB: Aug 27, 1945, 69 y.o.   MRN: [id number]  HPI Comments: 69 year old female with history of right sided breast cancer s/p partial mastectomy and radiation (no chemo), HTN, HLD, OSA on CPAP, bilateral osteoarthritis s/p right TKA 04/16/13 and left TKA 09/15/14, and GERD, previous admission to [facility] on 2/7-09/22/12 for acute diastolic CHF.  She presents today for follow-up of her chronic diastolic CHF She is not on a statin for her HLD 2/2 myalgias.   In follow-up, she reports that she is doing well. Denies any significant leg edema. She takes lisinopril HCTZ daily with Lasix 40 mg after lunch. On this regiment she feels well, no abdominal bloating, no significant weight change. She is relatively active, walks with a cane. No recent falls. She reports that weight has been stable Denies any significant chest pain concerning for angina  Other past medical history  underwent nuclear stressing testing on 08/01/2014 that what negative for ischemia, negative for WMA, EF of 66%.    She underwent successful left TKA on 09/15/2014. Post op course was uneventful and she was discharged on 2/3.   From prior notes; She presented to [facility] on 09/21/12 with slow onset of SOB with some associated mild lower extremity edema. No chest pain, arm pain, PND, or orthopnea. She also noted post op she was having increased dyspnea more easily. She presented upon having SOB with rest. EKG with no acute changes and cardiac enzymes were negative. CTA of the chest was negative for PE. It was also noted to have a linear and patchy opacity in the left lower lobe. This was slightly greater than expected for atelectasis and may reflect pneumonia. Aspiration could have a similar appearance. Bilateral lower extremity dopplers were negative for DVT. She was already taking Xarelto 10 mg daily for DVT PPX. Echo showed EF 62-69%, diastolic dysfunction, mild LVH, normal RV size and systolic function, mildly  dilated left atrium at 4.3 cm, mild MR/TR, mildly elevated PASP at 36.7 mm Hg. She was diuresed with IV Lasix, transitioned to PO Lasix and discharged on Lasix 20 mg daily, Aldactone 25 mg daily, lisinopril 20 mg daily, and atenolol 50 mg daily, Xarelto 10 mg daily (PPX), and Levaquin 500 mg.      Allergies  Allergen Reactions  . Codeine     Gi problems   . Penicillins     anaphylaxis    . Statins Other (See Comments)    Leg pains  . Sulfa Antibiotics     anaphylaxis     Current Outpatient Prescriptions on File Prior to Visit  Medication Sig Dispense Refill  . ascorbic acid (VITAMIN C) 500 MG tablet Take by mouth.    [name] atenolol (TENORMIN) 50 MG tablet Take 50 mg by mouth every morning.     . beta carotene 25000 UNIT capsule Take 25,000 Units by mouth at bedtime.     . busPIRone (BUSPAR) 15 MG tablet Take 30 mg by mouth at bedtime.     . Calcium-Vitamin D 600-200 MG-UNIT per tablet Take by mouth.    . fluticasone (FLONASE) 50 MCG/ACT nasal spray Place 1 spray into the nose daily as needed for rhinitis or allergies.     . furosemide (LASIX) 40 MG tablet Take 40 mg by mouth daily.     [name] ibuprofen (ADVIL,MOTRIN) 200 MG tablet Take by mouth as needed.    [name] LORazepam (ATIVAN) 0.5 MG tablet Take 1 tablet (0.5 mg total)  by mouth 2 (two) times daily as needed for anxiety. (Patient taking differently: Take 1 mg by mouth at bedtime. ) 30 tablet 0  . NEXIUM 40 MG capsule Take 40 mg by mouth every morning.     [name] NUTRITIONAL SUPPLEMENTS PO Take by mouth daily.    . Omega-3 Fatty Acids (FISH OIL) 1200 MG CAPS Take by mouth 2 (two) times daily.    . potassium chloride (MICRO-K) 10 MEQ CR capsule Take 10 mEq by mouth 2 (two) times daily.     [name] spironolactone (ALDACTONE) 25 MG tablet Take 25 mg by mouth every morning.    [name] tiZANidine (ZANAFLEX) 2 MG tablet Take by mouth.    . vitamin A 10000 UNIT capsule Take by mouth.    . Vitamin E 400 UNITS TABS Take by mouth.     No current  facility-administered medications on file prior to visit.    Past Medical History  Diagnosis Date  . Hypertension   . Diffuse cystic mastopathy 2014  . Anxiety   . Depression   . GERD (gastroesophageal reflux disease)   . Chronic diastolic CHF (congestive heart failure)     a. echo 09/2014: EF 34-74%, diastolic dysfunction, mild LVH, nl RV size & systolic function, mildly dilated LA (4.3 cm), mild MR/TR, mildly elevated PASP 36.7 mm Hg  . HLD (hyperlipidemia)     a. statin intolerant 2/2 myalgias  . Sleep difficulties     LUNESTA HAS HELPED  . OSA on CPAP   . 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 PARTIAL RIGHT MASTECTOMY FOR BREAST CANCER--HAD RADIATION - NO CHEMO --DR. Ellsworth ONCOLOGIST  . Osteoarthritis of both knees     a. s/p right TKA 04/2013 & left TKA 09/2014  . Headache     rare  . DDD (degenerative disc disease), cervical   . DDD (degenerative disc disease), lumbar   . Obesity   . Otitis externa   . Vaginal cyst   . Hypercholesterolemia     Past Surgical History  Procedure Laterality Date  . Colonoscopy  2015     1 benign polyp-every 5 years  . Ercp  1995  . Mastectomy Right 10/19/2012    PARTIAL MASTECTOMY  . Cholecystectomy  1994  . Tubal ligation  1979  . Abdominal hysterectomy  1992  . Total knee arthroplasty Right 04/16/2013    Procedure: RIGHT TOTAL KNEE ARTHROPLASTY;  Surgeon: Mauri Pole, MD;  Location: [facility];  Service: Orthopedics;  Laterality: Right;  . Joint replacement Right Sept 2014    knee  . Breast surgery Right March 2014    lumpectomy RT 10 mm papillary DCIS, ER/PR positive. Sentinel node negative. Partial breast radiation.  . Total knee arthroplasty Left 09/15/2014    Procedure: LEFT TOTAL KNEE ARTHROPLASTY;  Surgeon: Mauri Pole, MD;  Location: [facility];  Service: Orthopedics;  Laterality: Left;    Social History  reports that she has never  smoked. She has never used smokeless tobacco. She reports that she does not drink alcohol or use illicit drugs.  Family History family history includes Breast cancer in her maternal grandfather; Colon cancer in her maternal grandfather and mother.   Review of Systems  Constitutional: Negative.   Respiratory: Negative.   Cardiovascular: Negative.   Gastrointestinal: Negative.   Musculoskeletal: Negative.   Skin: Negative.   Neurological: Negative.   Hematological: Negative.   Psychiatric/Behavioral: Negative.  All other systems reviewed and are negative.   BP 140/80 mmHg  Pulse 65  Ht 5\' 4"  (1.626 m)  Wt 246 lb 8 oz (111.812 kg)  BMI 42.29 kg/m2  Physical Exam  Constitutional: She is oriented to person, place, and time. She appears well-developed and well-nourished.  Obese, walks with a cane  HENT:  Head: Normocephalic.  Nose: Nose normal.  Mouth/Throat: Oropharynx is clear and moist.  Eyes: Conjunctivae are normal. Pupils are equal, round, and reactive to light.  Neck: Normal range of motion. Neck supple. No JVD present.  Cardiovascular: Normal rate, regular rhythm, S1 normal, S2 normal, normal heart sounds and intact distal pulses.  Exam reveals no gallop and no friction rub.   No murmur heard. Pulmonary/Chest: Effort normal and breath sounds normal. No respiratory distress. She has no wheezes. She has no rales. She exhibits no tenderness.  Abdominal: Soft. Bowel sounds are normal. She exhibits no distension. There is no tenderness.  Musculoskeletal: Normal range of motion. She exhibits no edema or tenderness.  Lymphadenopathy:    She has no cervical adenopathy.  Neurological: She is alert and oriented to person, place, and time. Coordination normal.  Skin: Skin is warm and dry. No rash noted. No erythema.  Psychiatric: She has a normal mood and affect. Her behavior is normal. Judgment and thought content normal.    Assessment and Plan  Nursing note and vitals  reviewed.

## 2015-01-02 NOTE — Assessment & Plan Note (Signed)
Unable to tolerate statins 

## 2015-01-02 NOTE — Assessment & Plan Note (Signed)
Blood pressure is well controlled on today's visit. No changes made to the medications. 

## 2015-01-02 NOTE — Assessment & Plan Note (Signed)
Appears euvolemic, no changes to her medications.

## 2015-01-02 NOTE — Patient Instructions (Signed)
You are doing well. No medication changes were made.  Please call us if you have new issues that need to be addressed before your next appt.  Your physician wants you to follow-up in: 6 months.  You will receive a reminder letter in the mail two months in advance. If you don't receive a letter, please call our office to schedule the follow-up appointment.   

## 2015-01-06 DIAGNOSIS — R739 Hyperglycemia, unspecified: Secondary | ICD-10-CM | POA: Diagnosis not present

## 2015-01-06 DIAGNOSIS — E780 Pure hypercholesterolemia: Secondary | ICD-10-CM | POA: Diagnosis not present

## 2015-01-21 ENCOUNTER — Other Ambulatory Visit: Payer: Self-pay | Admitting: Family Medicine

## 2015-01-21 DIAGNOSIS — E876 Hypokalemia: Secondary | ICD-10-CM

## 2015-01-28 ENCOUNTER — Encounter: Payer: Self-pay | Admitting: Family Medicine

## 2015-02-13 ENCOUNTER — Other Ambulatory Visit: Payer: Self-pay | Admitting: Family Medicine

## 2015-02-13 DIAGNOSIS — I5032 Chronic diastolic (congestive) heart failure: Secondary | ICD-10-CM

## 2015-03-02 ENCOUNTER — Other Ambulatory Visit: Payer: Self-pay | Admitting: Family Medicine

## 2015-03-02 MED ORDER — FLUTICASONE PROPIONATE 50 MCG/ACT NA SUSP: 2.0000 | Freq: Every day | NASAL | Status: DC | PRN

## 2015-03-02 NOTE — Telephone Encounter (Signed)
fluticasone (FLONASE) 50 MCG/ACT nasal spray She uses [employer].  Thanks TP

## 2015-03-09 ENCOUNTER — Ambulatory Visit (INDEPENDENT_AMBULATORY_CARE_PROVIDER_SITE_OTHER): Payer: Medicare Other | Admitting: Family Medicine

## 2015-03-09 ENCOUNTER — Encounter: Payer: Self-pay | Admitting: Family Medicine

## 2015-03-09 VITALS — BP 128/84 | HR 80 | Temp 98.3°F | Resp 16 | Ht 64.0 in | Wt 254.0 lb

## 2015-03-09 DIAGNOSIS — H6123 Impacted cerumen, bilateral: Secondary | ICD-10-CM | POA: Diagnosis not present

## 2015-03-09 DIAGNOSIS — H9190 Unspecified hearing loss, unspecified ear: Secondary | ICD-10-CM | POA: Diagnosis not present

## 2015-03-09 DIAGNOSIS — H6120 Impacted cerumen, unspecified ear: Secondary | ICD-10-CM | POA: Insufficient documentation

## 2015-03-09 NOTE — Progress Notes (Signed)
Subjective:    Patient ID: [name], female    DOB: 1946-04-27, 69 y.o.   MRN: [id number]  Otalgia  There is pain in the left ear. This is a new problem. The current episode started 1 to 4 weeks ago (Happened about two weeks ago. ). The problem occurs constantly. The problem has been unchanged. There has been no fever. The pain is at a severity of 5/10. The pain is moderate. Associated symptoms include hearing loss. Pertinent negatives include no abdominal pain, coughing, diarrhea, ear discharge, headaches, rhinorrhea, sore throat or vomiting. Treatments tried: Flonase. The treatment provided no relief.    Patient Active Problem List   Diagnosis Date Noted  . Hypokalemia 01/21/2015  . MI (mitral incompetence) 12/10/2014  . Depression 12/09/2014  . Sleep apnea 12/09/2014  . GERD (gastroesophageal reflux disease) 12/09/2014  . Hyperglycemia 12/09/2014  . DCIS (ductal carcinoma in situ) of breast 11/19/2014  . Chronic diastolic CHF (congestive heart failure)   . HLD (hyperlipidemia)   . Osteoarthritis of both knees   . Obesity   . S/P left TKA 09/15/2014  . S/P left TKA 08/19/2014  . Morbid obesity 04/17/2013  . Hyponatremia 04/17/2013  . DCIS (ductal carcinoma in situ) 11/05/2012  . Essential hypertension, benign 11/05/2012  . Benign essential HTN 11/05/2012   Family History  Problem Relation Age of Onset  . Colon cancer Maternal Grandfather   . Breast cancer Maternal Grandfather   . Colon cancer Mother   . Melanoma Father    History   Social History  . Marital Status: Married    Spouse Name: Chrissie Noa  . Number of Children: 2  . Years of Education: College   Occupational History  . Retired    Social History Main Topics  . Smoking status: Never Smoker   . Smokeless tobacco: Never Used     Comment: social smoker as a teen  . Alcohol Use: No  . Drug Use: No  . Sexual Activity: Not on file   Other Topics Concern  . Not on file   Social History Narrative    Past Surgical History  Procedure Laterality Date  . Colonoscopy  2015     1 benign polyp-every 5 years  . Ercp  1995  . Mastectomy Right 10/19/2012    PARTIAL MASTECTOMY  . Cholecystectomy  1994  . Tubal ligation  1979  . Abdominal hysterectomy  1992  . Total knee arthroplasty Right 04/16/2013    Procedure: RIGHT TOTAL KNEE ARTHROPLASTY;  Surgeon: Mauri Pole, MD;  Location: [facility];  Service: Orthopedics;  Laterality: Right;  . Joint replacement Right Sept 2014    knee  . Breast surgery Right March 2014    lumpectomy RT 10 mm papillary DCIS, ER/PR positive. Sentinel node negative. Partial breast radiation.  . Total knee arthroplasty Left 09/15/2014    Procedure: LEFT TOTAL KNEE ARTHROPLASTY;  Surgeon: Mauri Pole, MD;  Location: [facility];  Service: Orthopedics;  Laterality: Left;   Allergies  Allergen Reactions  . Codeine     Gi problems   . Penicillins     anaphylaxis    . Statins Other (See Comments)    Leg pains  . Sulfa Antibiotics     anaphylaxis    Previous Medications   ASCORBIC ACID (VITAMIN C) 500 MG TABLET    Take by mouth.   ATENOLOL (TENORMIN) 50 MG TABLET    Take 50 mg by mouth every morning.    BELSOMRA 20  MG TABS    TK 1 T PO QPM   BETA CAROTENE 30160 UNIT CAPSULE    Take 25,000 Units by mouth at bedtime.    BUSPIRONE (BUSPAR) 15 MG TABLET    Take 30 mg by mouth at bedtime.    CALCIUM-VITAMIN D 600-200 MG-UNIT PER TABLET    Take by mouth.   CLINDAMYCIN (CLEOCIN) 300 MG CAPSULE    TK 2 CS PO 1 HOUR PRIOR TO TREATMENT   DULOXETINE (CYMBALTA) 60 MG CAPSULE    Take 60 mg by mouth daily.   FLUTICASONE (FLONASE) 50 MCG/ACT NASAL SPRAY    Place 2 sprays into both nostrils daily as needed for rhinitis or allergies.   FUROSEMIDE (LASIX) 40 MG TABLET    Take 40 mg by mouth daily.    IBUPROFEN (ADVIL,MOTRIN) 200 MG TABLET    Take by mouth as needed.   LISINOPRIL-HYDROCHLOROTHIAZIDE (PRINZIDE,ZESTORETIC) 20-12.5 MG PER TABLET    Take 1 tablet by mouth daily.    LORAZEPAM (ATIVAN) 0.5 MG TABLET    Take 1 tablet (0.5 mg total) by mouth 2 (two) times daily as needed for anxiety.   NEXIUM 40 MG CAPSULE    Take 40 mg by mouth every morning.    OMEGA-3 FATTY ACIDS (FISH OIL) 1200 MG CAPS    Take by mouth 2 (two) times daily.   POTASSIUM CHLORIDE (MICRO-K) 10 MEQ CR CAPSULE    TAKE 1 CAPSULE BY MOUTH TWICE DAILY   SPIRONOLACTONE (ALDACTONE) 25 MG TABLET    TAKE 1 TABLET BY MOUTH EVERY DAY   TAMOXIFEN (NOLVADEX) 20 MG TABLET    TK 1 T PO QD   TIZANIDINE (ZANAFLEX) 2 MG TABLET    Take by mouth.   TIZANIDINE (ZANAFLEX) 4 MG TABLET    TK 1 T PO AT SUPPERTIME AND 1 T HS   VENLAFAXINE XR (EFFEXOR-XR) 150 MG 24 HR CAPSULE    TK 1 C PO D   VITAMIN A 10932 UNIT CAPSULE    Take by mouth.   VITAMIN E 400 UNITS TABS    Take by mouth.   BP 128/84 mmHg  Pulse 80  Temp(Src) 98.3 F (36.8 C) (Oral)  Resp 16  Ht 5\' 4"  (1.626 m)  Wt 254 lb (115.214 kg)  BMI 43.58 kg/m2    Review of Systems  Constitutional: Negative for fever, chills, diaphoresis, activity change, appetite change, fatigue and unexpected weight change.  HENT: Positive for congestion, ear pain, hearing loss and tinnitus. Negative for ear discharge, mouth sores, nosebleeds, postnasal drip, rhinorrhea, sinus pressure, sneezing, sore throat, trouble swallowing and voice change.   Respiratory: Negative for apnea, cough, choking, chest tightness, shortness of breath, wheezing and stridor.   Cardiovascular: Negative for chest pain, palpitations and leg swelling.  Gastrointestinal: Negative for nausea, vomiting, abdominal pain, diarrhea, constipation, blood in stool, abdominal distention, anal bleeding and rectal pain.  Neurological: Negative for dizziness, light-headedness and headaches.       Objective:   Physical Exam  Constitutional: She appears well-developed and well-nourished.  HENT:  Head: Normocephalic and atraumatic.  Right Ear: Decreased hearing is noted.  Left Ear: Decreased hearing is  noted.  Cerumen impaction bilaterally.  Improved after flushing but not completely improved .   Eyes: Conjunctivae are normal. Pupils are equal, round, and reactive to light.  Neck: Normal range of motion. Neck supple.   BP 128/84 mmHg  Pulse 80  Temp(Src) 98.3 F (36.8 C) (Oral)  Resp 16  Ht 5\' 4"  (1.626  m)  Wt 254 lb (115.214 kg)  BMI 43.58 kg/m2      Assessment & Plan:  1. Cerumen impaction, bilateral Improved after flushing but not resolved.    2. Hearing loss, unspecified laterality Improved, but not resolved. Will use Debrox, and recheck in 1 week  Margarita Rana, MD

## 2015-03-13 ENCOUNTER — Other Ambulatory Visit: Payer: Self-pay | Admitting: Family Medicine

## 2015-03-13 DIAGNOSIS — I10 Essential (primary) hypertension: Secondary | ICD-10-CM

## 2015-03-16 ENCOUNTER — Ambulatory Visit (INDEPENDENT_AMBULATORY_CARE_PROVIDER_SITE_OTHER): Payer: Medicare Other | Admitting: Family Medicine

## 2015-03-16 ENCOUNTER — Encounter: Payer: Self-pay | Admitting: *Deleted

## 2015-03-16 ENCOUNTER — Emergency Department
Admission: EM | Admit: 2015-03-16 | Discharge: 2015-03-16 | Disposition: A | Discharge status: Home / Self Care | Payer: Medicare Other | Attending: Emergency Medicine | Admitting: Emergency Medicine

## 2015-03-16 ENCOUNTER — Encounter: Payer: Self-pay | Admitting: Family Medicine

## 2015-03-16 VITALS — BP 118/78 | HR 79 | Temp 98.3°F | Resp 16 | Wt 259.4 lb

## 2015-03-16 DIAGNOSIS — H9193 Unspecified hearing loss, bilateral: Secondary | ICD-10-CM | POA: Diagnosis not present

## 2015-03-16 DIAGNOSIS — H6092 Unspecified otitis externa, left ear: Secondary | ICD-10-CM | POA: Insufficient documentation

## 2015-03-16 DIAGNOSIS — H6123 Impacted cerumen, bilateral: Secondary | ICD-10-CM | POA: Diagnosis not present

## 2015-03-16 DIAGNOSIS — I10 Essential (primary) hypertension: Secondary | ICD-10-CM | POA: Diagnosis not present

## 2015-03-16 DIAGNOSIS — H9202 Otalgia, left ear: Secondary | ICD-10-CM | POA: Diagnosis present

## 2015-03-16 MED ORDER — NEOMYCIN-COLIST-HC-THONZONIUM 3.3-3-10-0.5 MG/ML OT SUSP
4.0000 [drp] | Freq: Once | OTIC | Status: AC
  Administered 2015-03-16: 4 [drp] via OTIC

## 2015-03-16 MED ORDER — NEOMYCIN-POLYMYXIN-HC 1 % OT SOLN: 3.0000 [drp] | Freq: Four times a day (QID) | OTIC | Status: DC

## 2015-03-16 MED ORDER — NEOMYCIN-POLYMYXIN-HC 3.5-10000-1 OT SUSP
OTIC | Status: AC
  Filled 2015-03-16: qty 10

## 2015-03-16 MED ORDER — OXYCODONE-ACETAMINOPHEN 5-325 MG PO TABS
2.0000 | ORAL_TABLET | Freq: Once | ORAL | Status: AC
  Administered 2015-03-16: 2 via ORAL
  Filled 2015-03-16: qty 2

## 2015-03-16 NOTE — Discharge Instructions (Signed)

## 2015-03-16 NOTE — ED Notes (Signed)
Pt reports having ear pain since 6 this evening. Pt reports having ears irrigated today.

## 2015-03-16 NOTE — ED Notes (Signed)
Pt states she went to her doctor today for ear pain, and they flushed out her ears. She states she did not have an ear infection. After going home she started having a lot of pain in her left ear and wants it evaluated

## 2015-03-16 NOTE — Progress Notes (Signed)
Patient: Selena Bush Female    DOB: 1946-06-14   69 y.o.   MRN: [id number] Visit Date: 03/16/2015  Today's Provider: Margarita Rana, MD   Chief Complaint  Patient presents with  . Follow-up     Cerumen Impaction,Hearing Loss unspecified Laterality   Subjective:    HPI  Patient is here today to follow up on Cerumen impaction. Last office visit was 03/09/15 ears improved after flushing, but not improved. Patient was recommended to use Debrox and recheck in 1 week. Still feels blocked with continued hearing loss. No pain. Tolerated flushing last week. No problem with debrox.       Allergies  Allergen Reactions  . Codeine     Gi problems   . Penicillins     anaphylaxis    . Statins Other (See Comments)    Leg pains  . Sulfa Antibiotics     anaphylaxis    Previous Medications   ASCORBIC ACID (VITAMIN C) 500 MG TABLET    Take by mouth.   ATENOLOL (TENORMIN) 50 MG TABLET    Take 50 mg by mouth every morning.    BELSOMRA 20 MG TABS    TK 1 T PO QPM   BETA CAROTENE 62376 UNIT CAPSULE    Take 25,000 Units by mouth at bedtime.    BUSPIRONE (BUSPAR) 15 MG TABLET    Take 30 mg by mouth at bedtime.    CALCIUM-VITAMIN D 600-200 MG-UNIT PER TABLET    Take by mouth.   CLINDAMYCIN (CLEOCIN) 300 MG CAPSULE    TK 2 CS PO 1 HOUR PRIOR TO TREATMENT   DULOXETINE (CYMBALTA) 60 MG CAPSULE    Take 60 mg by mouth daily.   FLUTICASONE (FLONASE) 50 MCG/ACT NASAL SPRAY    Place 2 sprays into both nostrils daily as needed for rhinitis or allergies.   FUROSEMIDE (LASIX) 40 MG TABLET    Take 40 mg by mouth daily.    IBUPROFEN (ADVIL,MOTRIN) 200 MG TABLET    Take by mouth as needed.   LISINOPRIL-HYDROCHLOROTHIAZIDE (PRINZIDE,ZESTORETIC) 20-12.5 MG PER TABLET    TAKE 1 TABLET BY MOUTH EVERY DAY   LORAZEPAM (ATIVAN) 0.5 MG TABLET    Take 1 tablet (0.5 mg total) by mouth 2 (two) times daily as needed for anxiety.   NEXIUM 40 MG CAPSULE    Take 40 mg by mouth every morning.    OMEGA-3 FATTY ACIDS  (FISH OIL) 1200 MG CAPS    Take by mouth 2 (two) times daily.   POTASSIUM CHLORIDE (MICRO-K) 10 MEQ CR CAPSULE    TAKE 1 CAPSULE BY MOUTH TWICE DAILY   SPIRONOLACTONE (ALDACTONE) 25 MG TABLET    TAKE 1 TABLET BY MOUTH EVERY DAY   TAMOXIFEN (NOLVADEX) 20 MG TABLET    TK 1 T PO QD   TIZANIDINE (ZANAFLEX) 2 MG TABLET    Take by mouth.   TIZANIDINE (ZANAFLEX) 4 MG TABLET    TK 1 T PO AT SUPPERTIME AND 1 T HS   VENLAFAXINE XR (EFFEXOR-XR) 150 MG 24 HR CAPSULE    TK 1 C PO D   VITAMIN A 28315 UNIT CAPSULE    Take by mouth.   VITAMIN E 400 UNITS TABS    Take by mouth.    Review of Systems  Constitutional: Negative.   HENT: Positive for hearing loss and tinnitus. Negative for dental problem, ear discharge and ear pain.     History  Substance Use Topics  .  Smoking status: Never Smoker   . Smokeless tobacco: Never Used     Comment: social smoker as a teen  . Alcohol Use: No   Objective:   Temp(Src) 98.3 F (36.8 C) (Oral)  Wt 259 lb 6.4 oz (117.663 kg)  Physical Exam  Constitutional: She appears well-developed and well-nourished.  HENT:  Still with bilateral impaction, right worse than left. Flushed in office today with symptoms improvement.   Neck: Normal range of motion. Neck supple.      Assessment & Plan:     1. Cerumen impaction, bilateral Improved. Referral if does not continue to improve.  2. Hearing loss, bilateral Difficult to assess if all symptoms from impaction.  If does not improve in next few days, will call and we will refer to ENT.  Margarita Rana, MD      Margarita Rana, MD  [facility]

## 2015-03-16 NOTE — ED Provider Notes (Signed)
[facility] Emergency Department Provider Note     Time seen: ----------------------------------------- 9:48 PM on 03/16/2015 -----------------------------------------    I have reviewed the triage vital signs and the nursing notes.   HISTORY  Chief Complaint Otalgia    HPI Selena Bush is a 69 y.o. female who presents to ER today for ear pain. Patient states she went to her doctor today for ear pain flushed outer ears. She states she did not have an ear infection, after going home she started having a lot of pain in her left ear and wants it evaluated. Patient denies any fevers chills other complaints.   Past Medical History  Diagnosis Date  . Hypertension   . Diffuse cystic mastopathy 2014  . Anxiety   . Depression   . GERD (gastroesophageal reflux disease)   . Chronic diastolic CHF (congestive heart failure)     a. echo 09/2014: EF 09-60%, diastolic dysfunction, mild LVH, nl RV size & systolic function, mildly dilated LA (4.3 cm), mild MR/TR, mildly elevated PASP 36.7 mm Hg  . HLD (hyperlipidemia)     a. statin intolerant 2/2 myalgias  . Sleep difficulties     LUNESTA HAS HELPED  . OSA on CPAP   . 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 PARTIAL RIGHT MASTECTOMY FOR BREAST CANCER--HAD RADIATION - NO CHEMO --DR. Croswell ONCOLOGIST  . Osteoarthritis of both knees     a. s/p right TKA 04/2013 & left TKA 09/2014  . Headache     rare  . DDD (degenerative disc disease), cervical   . DDD (degenerative disc disease), lumbar   . Obesity   . Otitis externa   . Vaginal cyst   . Hypercholesterolemia     Patient Active Problem List   Diagnosis Date Noted  . Cerumen impaction 03/09/2015  . Hearing loss 03/09/2015  . Hypokalemia 01/21/2015  . MI (mitral incompetence) 12/10/2014  . Depression 12/09/2014  . Sleep apnea 12/09/2014  . GERD  (gastroesophageal reflux disease) 12/09/2014  . Hyperglycemia 12/09/2014  . DCIS (ductal carcinoma in situ) of breast 11/19/2014  . Chronic diastolic CHF (congestive heart failure)   . HLD (hyperlipidemia)   . Osteoarthritis of both knees   . Obesity   . S/P left TKA 08/19/2014  . Morbid obesity 04/17/2013  . Hyponatremia 04/17/2013  . DCIS (ductal carcinoma in situ) 11/05/2012  . Essential hypertension, benign 11/05/2012  . Benign essential HTN 11/05/2012    Past Surgical History  Procedure Laterality Date  . Colonoscopy  2015     1 benign polyp-every 5 years  . Ercp  1995  . Mastectomy Right 10/19/2012    PARTIAL MASTECTOMY  . Cholecystectomy  1994  . Tubal ligation  1979  . Abdominal hysterectomy  1992  . Total knee arthroplasty Right 04/16/2013    Procedure: RIGHT TOTAL KNEE ARTHROPLASTY;  Surgeon: Mauri Pole, MD;  Location: [facility];  Service: Orthopedics;  Laterality: Right;  . Joint replacement Right Sept 2014    knee  . Breast surgery Right March 2014    lumpectomy RT 10 mm papillary DCIS, ER/PR positive. Sentinel node negative. Partial breast radiation.  . Total knee arthroplasty Left 09/15/2014    Procedure: LEFT TOTAL KNEE ARTHROPLASTY;  Surgeon: Mauri Pole, MD;  Location: [facility];  Service: Orthopedics;  Laterality: Left;    Allergies Codeine; Penicillins; Statins; and Sulfa antibiotics  Social History History  Substance  Use Topics  . Smoking status: Never Smoker   . Smokeless tobacco: Never Used     Comment: social smoker as a teen  . Alcohol Use: No    Review of Systems Constitutional: Negative for fever. ENT: Positive for bilateral ear pain, worse in the left   10-point ROS otherwise negative.  ____________________________________________   PHYSICAL EXAM:  VITAL SIGNS: ED Triage Vitals  Enc Vitals Group     BP 03/16/15 2046 161/73 mmHg     Pulse Rate 03/16/15 2046 73     Resp 03/16/15 2046 18     Temp 03/16/15 2046 98.5 F (36.9 C)      Temp Source 03/16/15 2046 Oral     SpO2 03/16/15 2046 95 %     Weight 03/16/15 2046 250 lb (113.399 kg)     Height 03/16/15 2046 5\' 4"  (1.626 m)     Head Cir --      Peak Flow --      Pain Score 03/16/15 2046 4     Pain Loc --      Pain Edu? --      Excl. in [place]? --     Constitutional: Alert and oriented. Well appearing and in no distress. Eyes: Conjunctivae are normal. PERRL. Normal extraocular movements. ENT      Ears: There is mild erythema and fluid in both ear canals, difficult to tell if this is from irrigation or from otitis externa. TMs are grossly unremarkable.   Head: Normocephalic and atraumatic.   Nose: No congestion/rhinnorhea.   Mouth/Throat: Mucous membranes are moist.   Neck: No stridor.  ED COURSE:  Pertinent labs & imaging results that were available during my care of the patient were reviewed by me and considered in my medical decision making (see chart for details). It is possible that she has otitis externa, also possible that her ear canal just irritated from recent irrigation. It was a pain medicine as well as Cortisporin drops. ____________________________________________   FINAL ASSESSMENT AND PLAN  Otitis externa  Plan: Patient with labs and imaging as dictated above. Continue Corticosporin 4 times daily, follow up with ENT if not improving.   Earleen Newport, MD   Earleen Newport, MD 03/16/15 [phone]

## 2015-03-17 ENCOUNTER — Telehealth: Payer: Self-pay | Admitting: Family Medicine

## 2015-03-17 NOTE — Telephone Encounter (Signed)
Pt ask me to sent back a note stating her ears are a lot better and the medication is working.  VC#[phone]/[id number]

## 2015-03-26 ENCOUNTER — Other Ambulatory Visit: Payer: Self-pay | Admitting: Family Medicine

## 2015-03-26 DIAGNOSIS — I10 Essential (primary) hypertension: Secondary | ICD-10-CM

## 2015-04-07 ENCOUNTER — Telehealth: Payer: Self-pay | Admitting: Cardiovascular Disease

## 2015-04-07 NOTE — Telephone Encounter (Signed)
Does not wish to schedule an appt as directed by recall letter.  Patient said Dr. Rockey Situ told her she is doing well and if continues to do well she may not need a future appt.   Patient instructed to call if she would like to be seen in future.

## 2015-04-27 ENCOUNTER — Other Ambulatory Visit: Payer: Self-pay | Admitting: General Surgery

## 2015-05-05 ENCOUNTER — Encounter: Payer: Self-pay | Admitting: Family Medicine

## 2015-05-05 ENCOUNTER — Ambulatory Visit (INDEPENDENT_AMBULATORY_CARE_PROVIDER_SITE_OTHER): Payer: Medicare Other | Admitting: Family Medicine

## 2015-05-05 VITALS — BP 126/72 | HR 77 | Temp 98.1°F | Resp 16 | Ht 64.0 in | Wt 264.0 lb

## 2015-05-05 DIAGNOSIS — E785 Hyperlipidemia, unspecified: Secondary | ICD-10-CM

## 2015-05-05 DIAGNOSIS — E876 Hypokalemia: Secondary | ICD-10-CM

## 2015-05-05 DIAGNOSIS — R739 Hyperglycemia, unspecified: Secondary | ICD-10-CM

## 2015-05-05 DIAGNOSIS — Z0000 Encounter for general adult medical examination without abnormal findings: Secondary | ICD-10-CM | POA: Diagnosis not present

## 2015-05-05 DIAGNOSIS — I10 Essential (primary) hypertension: Secondary | ICD-10-CM | POA: Diagnosis not present

## 2015-05-05 DIAGNOSIS — Z23 Encounter for immunization: Secondary | ICD-10-CM

## 2015-05-05 NOTE — Patient Instructions (Signed)
Patient advised to get Prevnar 13 next year after 09/23/15.

## 2015-05-05 NOTE — Progress Notes (Signed)
Patient ID: Selena Bush, female   DOB: Nov 12, 1945, 69 y.o.   MRN: [id number]        Patient: Selena Bush, Female    DOB: 01/08/1946, 69 y.o.   MRN: [id number] Visit Date: 05/05/2015  Today's Provider: Margarita Rana, MD   Chief Complaint  Patient presents with  . Medicare Wellness   Subjective:    Annual wellness visit Selena Bush is a 69 y.o. female. She feels well. She reports exercising 3 times a week. She reports she is sleeping well.  11/29/14 Mammogram-Benign breast exam 07/24/14 Colonoscopy-WNL 07/21/14 EKG  Lab Results  Component Value Date   WBC 10.5 09/20/2014   HGB 9.0* 09/20/2014   HCT 27.8* 09/20/2014   PLT 245 09/20/2014   GLUCOSE 105* 10/03/2014   NA 137 10/03/2014   K 5.0 10/03/2014   CL 95* 10/03/2014   CREATININE 1.00 10/03/2014   BUN 13 10/03/2014   CO2 24 10/03/2014   INR 0.96 09/09/2014    -----------------------------------------------------------   Review of Systems  Constitutional: Negative.   HENT: Positive for tinnitus.   Eyes: Negative.   Respiratory: Positive for apnea.   Cardiovascular: Negative.   Gastrointestinal: Negative.   Endocrine: Negative.   Genitourinary: Negative.   Musculoskeletal: Negative.   Skin: Positive for color change (feet).  Allergic/Immunologic: Negative.   Neurological: Negative.   Hematological: Negative.   Psychiatric/Behavioral: Negative.     Social History   Social History  . Marital Status: Married    Spouse Name: Chrissie Noa  . Number of Children: 2  . Years of Education: College   Occupational History  . Retired    Social History Main Topics  . Smoking status: Never Smoker   . Smokeless tobacco: Never Used     Comment: social smoker as a teen  . Alcohol Use: No  . Drug Use: No  . Sexual Activity: Not on file   Other Topics Concern  . Not on file   Social History Narrative    Patient Active Problem List   Diagnosis Date Noted  . Cerumen impaction 03/09/2015  . Hearing  loss 03/09/2015  . Hypokalemia 01/21/2015  . MI (mitral incompetence) 12/10/2014  . Depression 12/09/2014  . Sleep apnea 12/09/2014  . GERD (gastroesophageal reflux disease) 12/09/2014  . Hyperglycemia 12/09/2014  . DCIS (ductal carcinoma in situ) of breast 11/19/2014  . Chronic diastolic CHF (congestive heart failure)   . HLD (hyperlipidemia)   . Osteoarthritis of both knees   . Obesity   . S/P left TKA 08/19/2014  . Morbid obesity 04/17/2013  . Hyponatremia 04/17/2013  . DCIS (ductal carcinoma in situ) 11/05/2012  . Essential hypertension, benign 11/05/2012  . Benign essential HTN 11/05/2012    Past Surgical History  Procedure Laterality Date  . Colonoscopy  2015     1 benign polyp-every 5 years  . Ercp  1995  . Mastectomy Right 10/19/2012    PARTIAL MASTECTOMY  . Cholecystectomy  1994  . Tubal ligation  1979  . Abdominal hysterectomy  1992  . Total knee arthroplasty Right 04/16/2013    Procedure: RIGHT TOTAL KNEE ARTHROPLASTY;  Surgeon: Mauri Pole, MD;  Location: [facility];  Service: Orthopedics;  Laterality: Right;  . Joint replacement Right Sept 2014    knee  . Breast surgery Right March 2014    lumpectomy RT 10 mm papillary DCIS, ER/PR positive. Sentinel node negative. Partial breast radiation.  . Total knee arthroplasty Left 09/15/2014    Procedure: LEFT TOTAL  KNEE ARTHROPLASTY;  Surgeon: Mauri Pole, MD;  Location: [facility];  Service: Orthopedics;  Laterality: Left;    Her family history includes Breast cancer in her maternal grandfather; Colon cancer in her maternal grandfather and mother; Melanoma in her father.    Previous Medications   ASCORBIC ACID (VITAMIN C) 500 MG TABLET    Take 500 mg by mouth daily.    ATENOLOL (TENORMIN) 50 MG TABLET    TAKE 1 TABLET BY MOUTH EVERY DAY   BELSOMRA 20 MG TABS    TK 1 T PO QPM   BETA CAROTENE 44818 UNIT CAPSULE    Take 25,000 Units by mouth at bedtime.    BUSPIRONE (BUSPAR) 15 MG TABLET    Take 30 mg by mouth at bedtime.     CALCIUM-VITAMIN D 600-200 MG-UNIT PER TABLET    Take 1 tablet by mouth daily.    CLINDAMYCIN (CLEOCIN) 300 MG CAPSULE    TK 2 CS PO 1 HOUR PRIOR TO TREATMENT   DULOXETINE (CYMBALTA) 60 MG CAPSULE    Take 60 mg by mouth daily.   FLUTICASONE (FLONASE) 50 MCG/ACT NASAL SPRAY    Place 2 sprays into both nostrils daily as needed for rhinitis or allergies.   FUROSEMIDE (LASIX) 40 MG TABLET    Take 40 mg by mouth daily.    IBUPROFEN (ADVIL,MOTRIN) 200 MG TABLET    Take by mouth as needed.   LISINOPRIL-HYDROCHLOROTHIAZIDE (PRINZIDE,ZESTORETIC) 20-12.5 MG PER TABLET    TAKE 1 TABLET BY MOUTH EVERY DAY   LORAZEPAM (ATIVAN) 0.5 MG TABLET    Take 1 tablet (0.5 mg total) by mouth 2 (two) times daily as needed for anxiety.   NEXIUM 40 MG CAPSULE    Take 40 mg by mouth every morning.    OMEGA-3 FATTY ACIDS (FISH OIL) 1200 MG CAPS    Take by mouth 2 (two) times daily.   POTASSIUM CHLORIDE (MICRO-K) 10 MEQ CR CAPSULE    TAKE 1 CAPSULE BY MOUTH TWICE DAILY   SPIRONOLACTONE (ALDACTONE) 25 MG TABLET    TAKE 1 TABLET BY MOUTH EVERY DAY   TAMOXIFEN (NOLVADEX) 20 MG TABLET    TAKE 1 TABLET BY MOUTH EVERY DAY   TIZANIDINE (ZANAFLEX) 4 MG TABLET    TK 1 T PO AT SUPPERTIME AND 1 T HS   VITAMIN A 56314 UNIT CAPSULE    Take by mouth.   VITAMIN E 400 UNITS TABS    Take by mouth.    Patient Care Team: Margarita Rana, MD as PCP - General (Family Medicine) Robert Bellow, MD (General Surgery)     Objective:   Vitals: BP 126/72 mmHg  Pulse 77  Temp(Src) 98.1 F (36.7 C) (Oral)  Resp 16  Ht 5\' 4"  (1.626 m)  Wt 264 lb (119.75 kg)  BMI 45.29 kg/m2  SpO2 95%  Physical Exam  Constitutional: She is oriented to person, place, and time. She appears well-developed and well-nourished.  HENT:  Head: Normocephalic and atraumatic.  Right Ear: Tympanic membrane, external ear and ear canal normal.  Left Ear: Tympanic membrane, external ear and ear canal normal.  Nose: Nose normal.  Mouth/Throat: Uvula is midline,  oropharynx is clear and moist and mucous membranes are normal.  Eyes: Conjunctivae, EOM and lids are normal. Pupils are equal, round, and reactive to light.  Neck: Trachea normal and normal range of motion. Neck supple. Carotid bruit is not present. No thyroid mass and no thyromegaly present.  Cardiovascular: Normal rate, regular rhythm  and normal heart sounds.   Pulmonary/Chest: Effort normal and breath sounds normal.  Abdominal: Soft. Normal appearance and bowel sounds are normal. There is no hepatosplenomegaly. There is no tenderness.  Musculoskeletal: Normal range of motion.  Lymphadenopathy:    She has no cervical adenopathy.    She has no axillary adenopathy.  Neurological: She is alert and oriented to person, place, and time. She has normal strength. No cranial nerve deficit.  Skin: Skin is warm, dry and intact.  Psychiatric: She has a normal mood and affect. Her speech is normal and behavior is normal. Judgment and thought content normal. Cognition and memory are normal.    Activities of Daily Living In your present state of health, do you have any difficulty performing the following activities: 05/05/2015 09/15/2014  Hearing? N N  Vision? N N  Difficulty concentrating or making decisions? N N  Walking or climbing stairs? Y Y  Dressing or bathing? Y Y  Doing errands, shopping? N N    Fall Risk Assessment Fall Risk  05/05/2015  Falls in the past year? No     Depression Screen PHQ 2/9 Scores 05/05/2015  PHQ - 2 Score 0    Cognitive Testing - 6-CIT  Correct? Score   What year is it? yes 0 0 or 4  What month is it? yes 0 0 or 3  Memorize:    Pia Mau,  44,  [address]  [place],      What time is it? (within 1 hour) yes 0 0 or 3  Count backwards from 20 yes 0 0, 2, or 4  Name the months of the year yes 0 0, 2, or 4  Repeat name & address above yes 0 0, 2, 4, 6, 8, or 10       TOTAL SCORE  0/28   Interpretation:  Normal  Normal (0-7) Abnormal (8-28)        Assessment & Plan:     Annual Wellness Visit  Reviewed patient's Family Medical History Reviewed and updated list of patient's medical providers Assessment of cognitive impairment was done Assessed patient's functional ability Established a written schedule for health screening [place] Completed and Reviewed  Exercise Activities and Dietary recommendations Goals    . Exercise 150 minutes per week (moderate activity)        There is no immunization history on file for this patient.  Health Maintenance  Topic Date Due  . Hepatitis C Screening  1946-03-02  . TETANUS/TDAP  05/16/1967  . COLONOSCOPY  05/15/1998  . ZOSTAVAX  05/15/2008  . DEXA SCAN  05/15/2013  . PNA vac Low Risk Adult (1 of 2 - PCV13) 05/15/2013  . INFLUENZA VACCINE  03/16/2015  . MAMMOGRAM  11/06/2016      1. Medicare annual wellness visit, subsequent Stable. Patient advised to continue eating healthy and exercise daily.  2. Need for influenza vaccination - Flu vaccine HIGH DOSE PF  3. Benign essential HTN - CBC with Differential/Platelet - Comprehensive metabolic panel - T4 AND TSH  4. HLD (hyperlipidemia) - Lipid Panel With LDL/HDL Ratio - T4 AND TSH  5. Hyperglycemia - Hemoglobin A1c  6. Hypokalemia - Comprehensive metabolic panel  7. Morbid obesity - T4 AND TSH   Patient seen and examined by Dr. Jerrell Belfast, and note scribed by Philbert Riser. Dim[place], CMA. I have reviewed the document for accuracy and completeness and I agree with above. Jerrell Belfast, MD    ------------------------------------------------------------------------------------------------------------

## 2015-05-08 LAB — CBC WITH DIFFERENTIAL/PLATELET
BASOS ABS: 0.1 10*3/uL (ref 0.0–0.2)
Basos: 1 %
EOS (ABSOLUTE): 0.2 10*3/uL (ref 0.0–0.4)
Eos: 4 %
HEMOGLOBIN: 11.7 g/dL (ref 11.1–15.9)
Hematocrit: 34.6 % (ref 34.0–46.6)
IMMATURE GRANULOCYTES: 0 %
Immature Grans (Abs): 0 10*3/uL (ref 0.0–0.1)
LYMPHS ABS: 1.6 10*3/uL (ref 0.7–3.1)
Lymphs: 26 %
MCH: 30.2 pg (ref 26.6–33.0)
MCHC: 33.8 g/dL (ref 31.5–35.7)
MCV: 89 fL (ref 79–97)
MONOCYTES: 8 %
Monocytes Absolute: 0.5 10*3/uL (ref 0.1–0.9)
NEUTROS PCT: 61 %
Neutrophils Absolute: 3.8 10*3/uL (ref 1.4–7.0)
Platelets: 226 10*3/uL (ref 150–379)
RBC: 3.88 x10E6/uL (ref 3.77–5.28)
RDW: 13 % (ref 12.3–15.4)
WBC: 6.2 10*3/uL (ref 3.4–10.8)

## 2015-05-08 LAB — LIPID PANEL WITH LDL/HDL RATIO
CHOLESTEROL TOTAL: 222 mg/dL — AB (ref 100–199)
HDL: 48 mg/dL (ref >39)
LDL Calculated: 115 mg/dL — ABNORMAL HIGH (ref 0–99)
LDl/HDL Ratio: 2.4 ratio units (ref 0.0–3.2)
TRIGLYCERIDES: 293 mg/dL — AB (ref 0–149)
VLDL Cholesterol Cal: 59 mg/dL — ABNORMAL HIGH (ref 5–40)

## 2015-05-08 LAB — COMPREHENSIVE METABOLIC PANEL
ALK PHOS: 55 IU/L (ref 39–117)
ALT: 16 IU/L (ref 0–32)
AST: 19 IU/L (ref 0–40)
Albumin/Globulin Ratio: 1.8 (ref 1.1–2.5)
Albumin: 4.2 g/dL (ref 3.6–4.8)
BUN/Creatinine Ratio: 17 (ref 11–26)
BUN: 20 mg/dL (ref 8–27)
Bilirubin Total: <0.2 mg/dL (ref 0.0–1.2)
CALCIUM: 9.7 mg/dL (ref 8.7–10.3)
CO2: 25 mmol/L (ref 18–29)
CREATININE: 1.18 mg/dL — AB (ref 0.57–1.00)
Chloride: 96 mmol/L — ABNORMAL LOW (ref 97–108)
GFR calc Af Amer: 56 mL/min/{1.73_m2} — ABNORMAL LOW (ref >59)
GFR calc non Af Amer: 48 mL/min/{1.73_m2} — ABNORMAL LOW (ref >59)
GLUCOSE: 99 mg/dL (ref 65–99)
Globulin, Total: 2.3 g/dL (ref 1.5–4.5)
Potassium: 4.6 mmol/L (ref 3.5–5.2)
Sodium: 140 mmol/L (ref 134–144)
Total Protein: 6.5 g/dL (ref 6.0–8.5)

## 2015-05-08 LAB — T4 AND TSH
T4, Total: 10 ug/dL (ref 4.5–12.0)
TSH: 4.69 u[IU]/mL — ABNORMAL HIGH (ref 0.450–4.500)

## 2015-05-08 LAB — HEMOGLOBIN A1C
ESTIMATED AVERAGE GLUCOSE: 126 mg/dL
HEMOGLOBIN A1C: 6 % — AB (ref 4.8–5.6)

## 2015-05-09 ENCOUNTER — Other Ambulatory Visit: Payer: Self-pay | Admitting: Family Medicine

## 2015-05-09 DIAGNOSIS — F32A Depression, unspecified: Secondary | ICD-10-CM

## 2015-05-09 DIAGNOSIS — F329 Major depressive disorder, single episode, unspecified: Secondary | ICD-10-CM

## 2015-05-13 ENCOUNTER — Telehealth: Payer: Self-pay

## 2015-05-13 NOTE — Telephone Encounter (Signed)
Advised pt of lab results. Pt verbally acknowledges understanding. Pt wishes to work on lifestyle changes. Renaldo Fiddler, CMA

## 2015-05-13 NOTE — Telephone Encounter (Signed)
-----   Message from Margarita Rana, MD sent at 05/10/2015  1:30 PM EDT ----- Labs stable. Blood sugar improved, just slightly above normal.  Thyroid slightly low, but not enough to treat. Kidney function stable.  Cholesterol elevated at 222, but does have fairly high good cholesterol which is good. 10 year risk of heart disease is 9 percent.  Can start medication if patient would like or continue to work on lifestyle changes. Thanks.

## 2015-05-25 ENCOUNTER — Other Ambulatory Visit: Payer: Self-pay | Admitting: Family Medicine

## 2015-05-25 DIAGNOSIS — R252 Cramp and spasm: Secondary | ICD-10-CM

## 2015-05-25 NOTE — Telephone Encounter (Signed)
Printed, please fax or call in to pharmacy. Thank you.   

## 2015-06-03 ENCOUNTER — Other Ambulatory Visit: Payer: Self-pay | Admitting: Family Medicine

## 2015-06-03 DIAGNOSIS — F32A Depression, unspecified: Secondary | ICD-10-CM

## 2015-06-03 DIAGNOSIS — F329 Major depressive disorder, single episode, unspecified: Secondary | ICD-10-CM

## 2015-06-14 ENCOUNTER — Other Ambulatory Visit: Payer: Self-pay | Admitting: Family Medicine

## 2015-06-14 DIAGNOSIS — I10 Essential (primary) hypertension: Secondary | ICD-10-CM

## 2015-06-27 ENCOUNTER — Other Ambulatory Visit: Payer: Self-pay | Admitting: Family Medicine

## 2015-06-27 DIAGNOSIS — I10 Essential (primary) hypertension: Secondary | ICD-10-CM

## 2015-06-30 ENCOUNTER — Telehealth: Payer: Self-pay | Admitting: *Deleted

## 2015-06-30 NOTE — Telephone Encounter (Signed)
Message left to notify of appointment change from 07-16-15 @10am  to 07-23-15 @ 10am.

## 2015-07-08 ENCOUNTER — Telehealth: Payer: Self-pay

## 2015-07-08 NOTE — Telephone Encounter (Signed)
Pt reports you called her and is returning your call. Renaldo Fiddler, CMA

## 2015-07-15 ENCOUNTER — Other Ambulatory Visit: Payer: Self-pay | Admitting: Family Medicine

## 2015-07-15 DIAGNOSIS — G4700 Insomnia, unspecified: Secondary | ICD-10-CM

## 2015-07-16 ENCOUNTER — Ambulatory Visit: Payer: Medicare Other | Admitting: Radiation Oncology

## 2015-07-17 DIAGNOSIS — G4700 Insomnia, unspecified: Secondary | ICD-10-CM | POA: Insufficient documentation

## 2015-07-17 NOTE — Telephone Encounter (Signed)
Please call in rx.  Thanks.  

## 2015-07-22 ENCOUNTER — Other Ambulatory Visit: Payer: Self-pay | Admitting: Family Medicine

## 2015-07-22 DIAGNOSIS — M62838 Other muscle spasm: Secondary | ICD-10-CM

## 2015-07-22 DIAGNOSIS — G4700 Insomnia, unspecified: Secondary | ICD-10-CM

## 2015-07-23 ENCOUNTER — Ambulatory Visit: Payer: Medicare Other | Admitting: Radiation Oncology

## 2015-07-25 ENCOUNTER — Other Ambulatory Visit: Payer: Self-pay | Admitting: General Surgery

## 2015-07-30 ENCOUNTER — Ambulatory Visit
Admission: RE | Admit: 2015-07-30 | Discharge: 2015-07-30 | Disposition: A | Discharge status: Home / Self Care | Payer: Medicare Other | Source: Ambulatory Visit | Attending: Radiation Oncology | Admitting: Radiation Oncology

## 2015-07-30 ENCOUNTER — Encounter: Payer: Self-pay | Admitting: Radiation Oncology

## 2015-07-30 VITALS — BP 161/91 | HR 75 | Temp 96.5°F | Wt 266.3 lb

## 2015-07-30 DIAGNOSIS — C50911 Malignant neoplasm of unspecified site of right female breast: Secondary | ICD-10-CM | POA: Diagnosis not present

## 2015-07-30 DIAGNOSIS — Z853 Personal history of malignant neoplasm of breast: Secondary | ICD-10-CM

## 2015-07-30 NOTE — Progress Notes (Signed)
Radiation Oncology Follow up Note  Name: Selena Bush   Date:   07/30/2015 MRN:  [id number] DOB: [birth year]    This 69 y.o. female presents to the clinic today for follow-up breast cancer now out 2-1/2 years status post. Accelerated partial breast radiation for ductal carcinoma in situ ER/PR positive  REFERRING PROVIDER: Margarita Rana, MD  HPI: Patient is a 69 year old female now out 2-1/2 years having completed accelerated partial breast irradiation to her right breast for ductal carcinoma in situ ER/PR positive seen today in routine follow-up she is doing well. She specifically denies breast tenderness cough or bone pain.. She is currently on I believe tamoxifen tolerating that well without side effect. Follow-up mammograms have been fine.  COMPLICATIONS OF TREATMENT: none  FOLLOW UP COMPLIANCE: keeps appointments   PHYSICAL EXAM:  BP 161/91 mmHg  Pulse 75  Temp(Src) 96.5 F (35.8 C)  Wt 266 lb 5.1 oz (120.8 kg) Well-developed female in NAD. Right breast has a thickening in the lumpectomy site which we would expect from high dose rate remote afterloading. No other dominant mass or nodularity is noted in either breast in 2 positions examined. No axillary or supraclavicular adenopathy is noted. Well-developed well-nourished patient in NAD. HEENT reveals PERLA, EOMI, discs not visualized.  Oral cavity is clear. No oral mucosal lesions are identified. Neck is clear without evidence of cervical or supraclavicular adenopathy. Lungs are clear to A&P. Cardiac examination is essentially unremarkable with regular rate and rhythm without murmur rub or thrill. Abdomen is benign with no organomegaly or masses noted. Motor sensory and DTR levels are equal and symmetric in the upper and lower extremities. Cranial nerves II through XII are grossly intact. Proprioception is intact. No peripheral adenopathy or edema is identified. No motor or sensory levels are noted. Crude visual fields are within  normal range.  RADIOLOGY RESULTS: Mammogram from March 2015 is reviewed showing no evidence of disease.  PLAN: Present time she continues to do well with no evidence of disease. I'm please were overall progress. I've assured of the thickening the breast will always continue to be there is a scar. She continues on asked by anti-estrogen therapy without side effect. I have asked to see her back in 1 year for follow-up. She knows to call sooner with any concerns.  I would like to take this opportunity for allowing me to participate in the care of your patient.Armstead Peaks., MD

## 2015-08-18 ENCOUNTER — Other Ambulatory Visit: Payer: Self-pay | Admitting: Family Medicine

## 2015-08-18 DIAGNOSIS — I5032 Chronic diastolic (congestive) heart failure: Secondary | ICD-10-CM

## 2015-08-31 ENCOUNTER — Telehealth: Payer: Self-pay | Admitting: Family Medicine

## 2015-08-31 DIAGNOSIS — I10 Essential (primary) hypertension: Secondary | ICD-10-CM

## 2015-08-31 NOTE — Telephone Encounter (Signed)
Pt states she has only been taking 1 a day of the potassium chloride (MICRO-K) 10 MEQ.  Pt states she has just noticed on the Rx that the direction say 2 a day.  Pt is asking can she continue to take 1 a day.  [id number]

## 2015-08-31 NOTE — Telephone Encounter (Signed)
Printed off met. c for pt to PU. Pt informed. Renaldo Fiddler, CMA

## 2015-08-31 NOTE — Telephone Encounter (Signed)
Last potassium was fine, but would recommend recheck met c before we determine if dosing is ok. Thanks.

## 2015-08-31 NOTE — Telephone Encounter (Signed)
LMTCB Selena Bush, CMA  

## 2015-09-14 ENCOUNTER — Encounter: Payer: Self-pay | Admitting: *Deleted

## 2015-09-25 ENCOUNTER — Other Ambulatory Visit: Payer: Self-pay | Admitting: Family Medicine

## 2015-09-25 DIAGNOSIS — F32A Depression, unspecified: Secondary | ICD-10-CM

## 2015-09-25 DIAGNOSIS — F329 Major depressive disorder, single episode, unspecified: Secondary | ICD-10-CM

## 2015-10-03 ENCOUNTER — Other Ambulatory Visit: Payer: Self-pay | Admitting: Family Medicine

## 2015-10-03 DIAGNOSIS — I5032 Chronic diastolic (congestive) heart failure: Secondary | ICD-10-CM

## 2015-10-11 IMAGING — CT CT ANGIO CHEST
2 of 6 series · 18 of 36 positions shown · IV contrast (APPLIED)
Comparison: Chest radiograph earlier this day.

CLINICAL DATA: Respiratory distress. Shortness of breath for 2
days. History of right knee surgery 6 days prior. History of
right-sided breast cancer.

EXAM:
CT ANGIOGRAPHY CHEST WITH CONTRAST
TECHNIQUE: Multidetector CT imaging of the chest was performed using the
standard protocol during bolus administration of intravenous
contrast. Multiplanar CT image reconstructions and MIPs were
obtained to evaluate the vascular anatomy.
CONTRAST:  75 mL Omnipaque 350 IV

[Series 5: pe 1.0 thins · axial · 0.77mm/px · z∈[-285,-29]mm · 17 of 290 slices shown]
[im 17/290  lung]
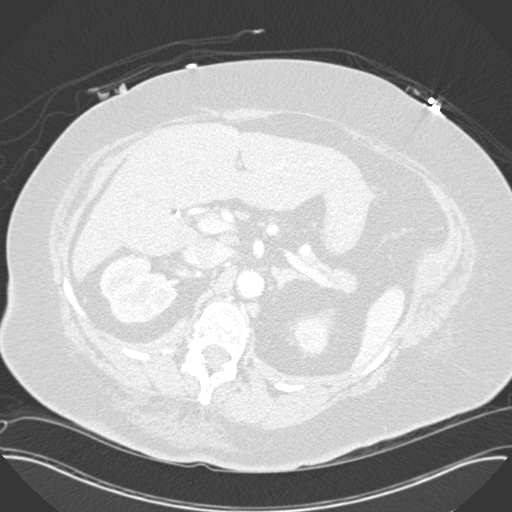
[im 33/290  mediastinal]
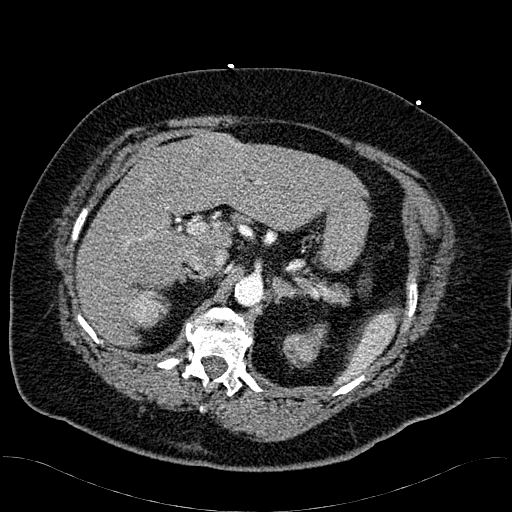
[im 49/290  lung]
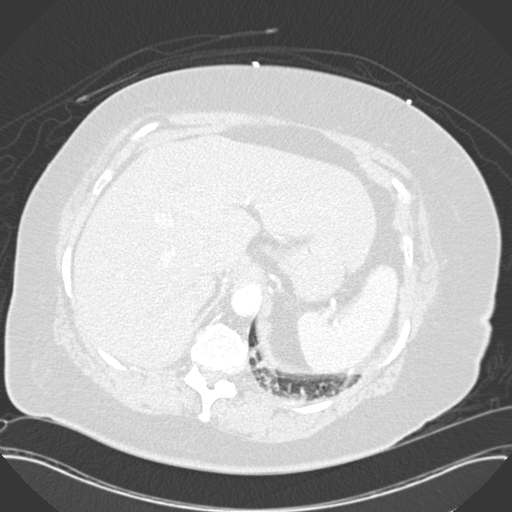
[im 65/290  mediastinal]
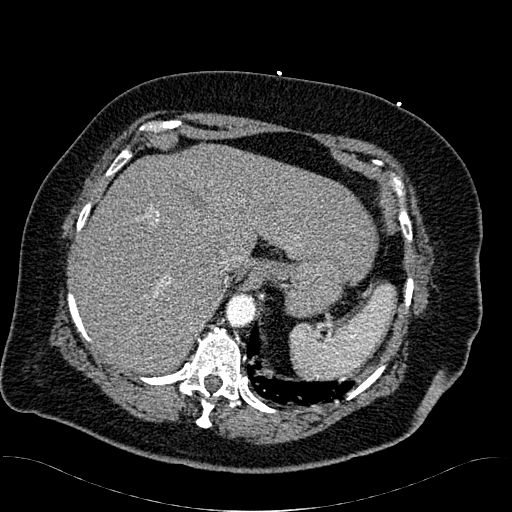
[im 81/290  lung]
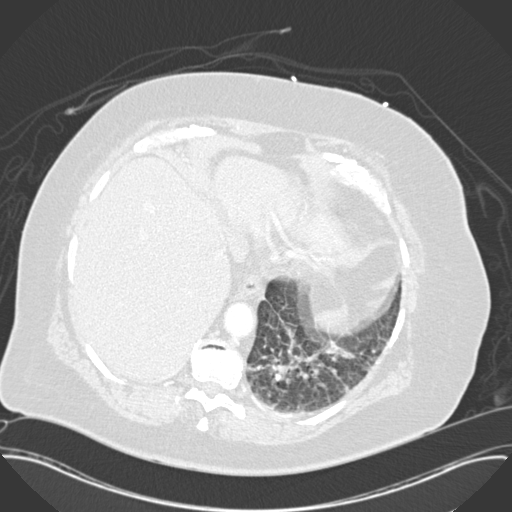
[im 97/290  mediastinal]
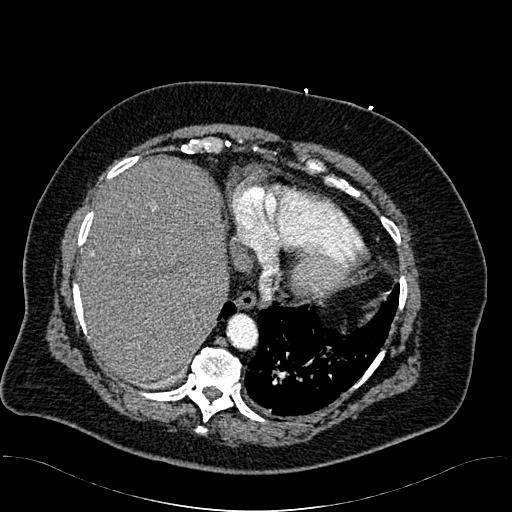
[im 113/290  lung]
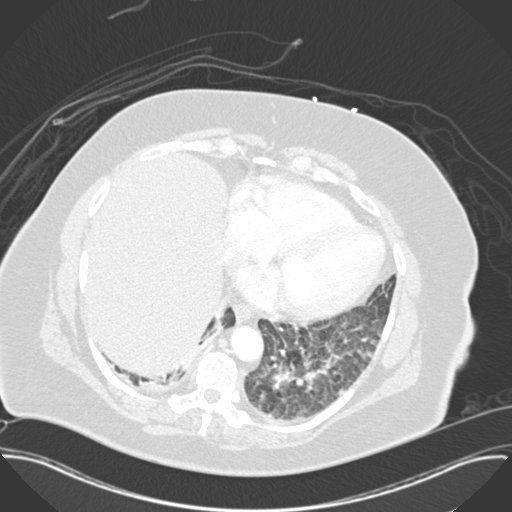
[im 129/290  mediastinal]
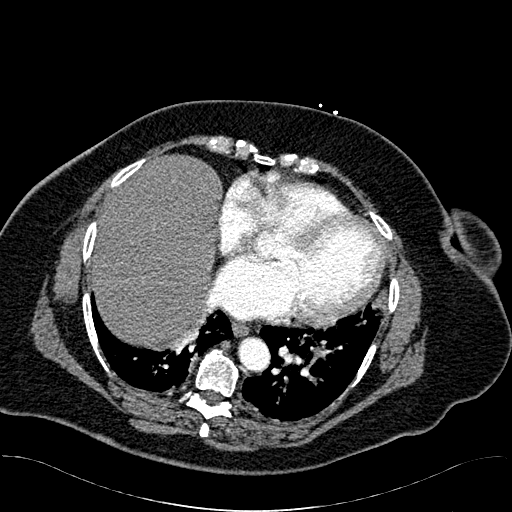
[im 145/290  lung]
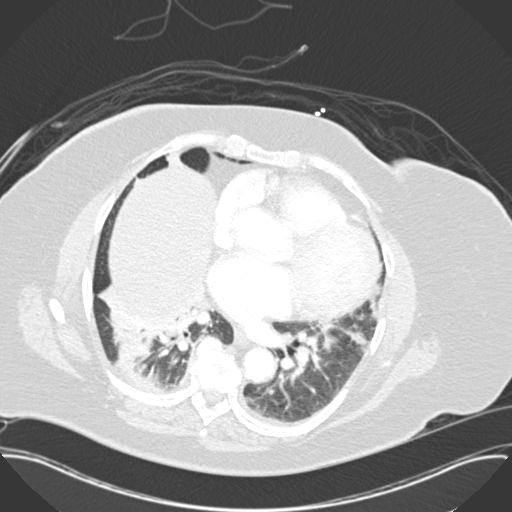
[im 161/290  mediastinal]
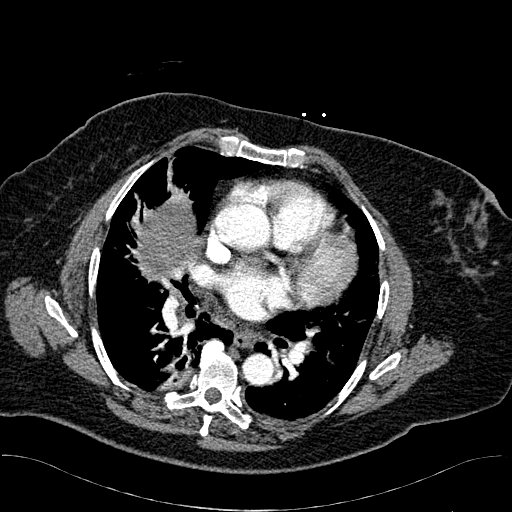
[im 177/290  lung]
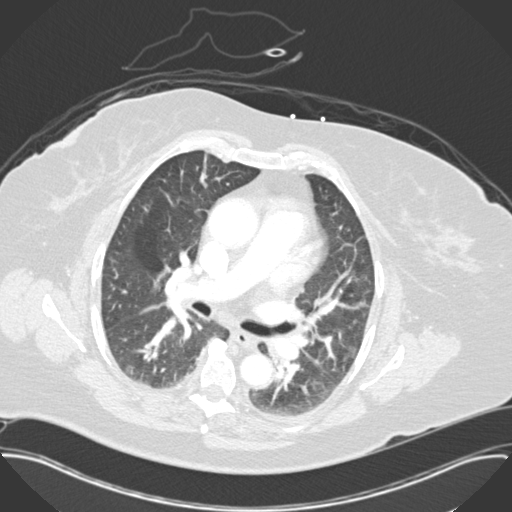
[im 193/290  mediastinal]
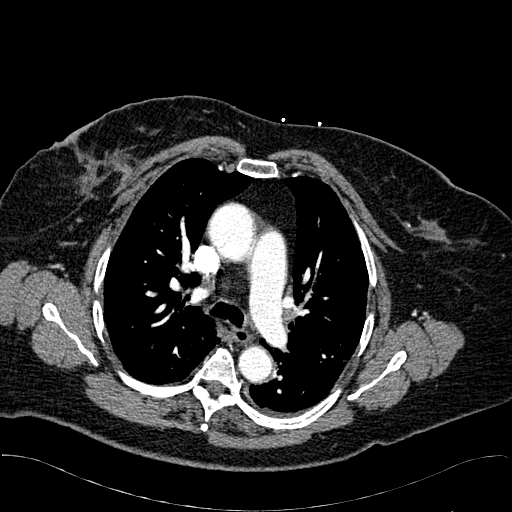
[im 209/290  lung]
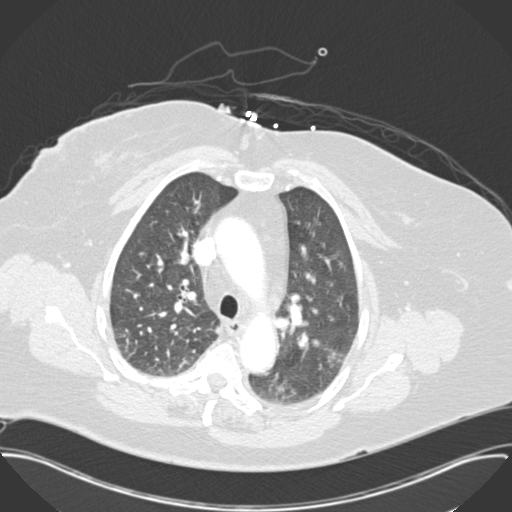
[im 225/290  mediastinal]
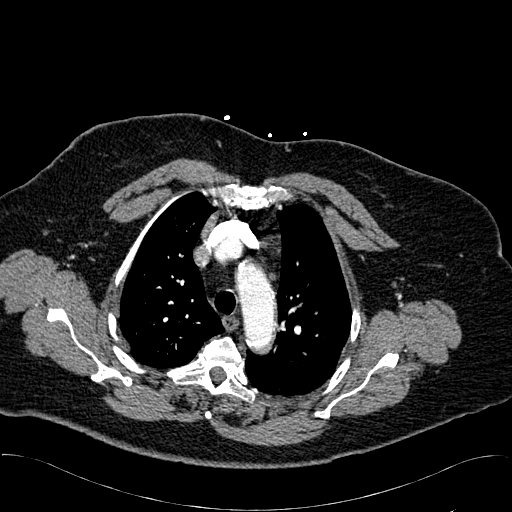
[im 241/290  lung]
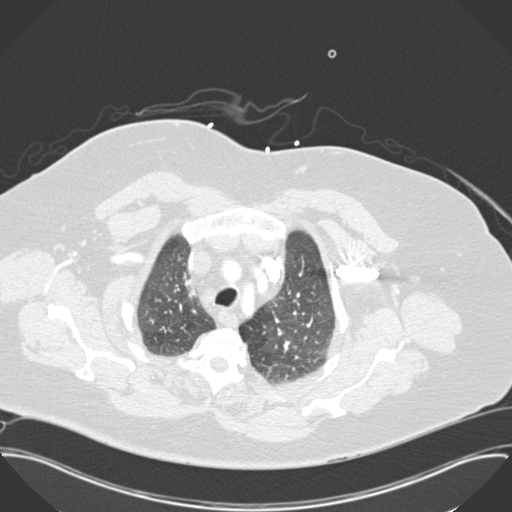
[im 257/290  mediastinal]
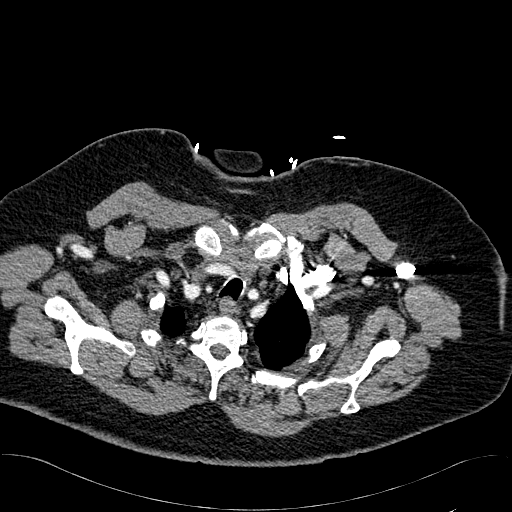
[im 273/290  lung]
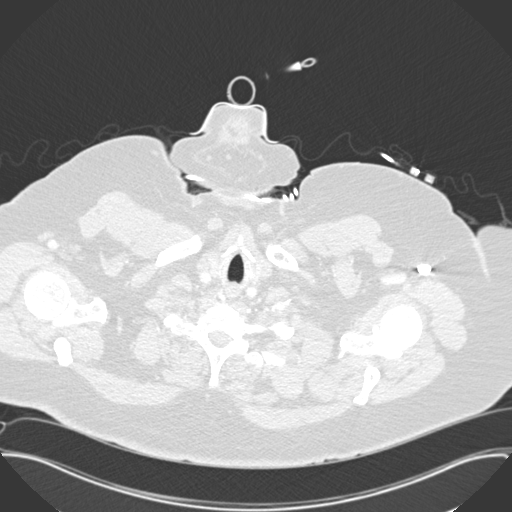

[Series 7: cor pe 2.0 mpr · coronal · 0.57mm/px · 1 of 131 slices shown]
[im 66/131  mediastinal]
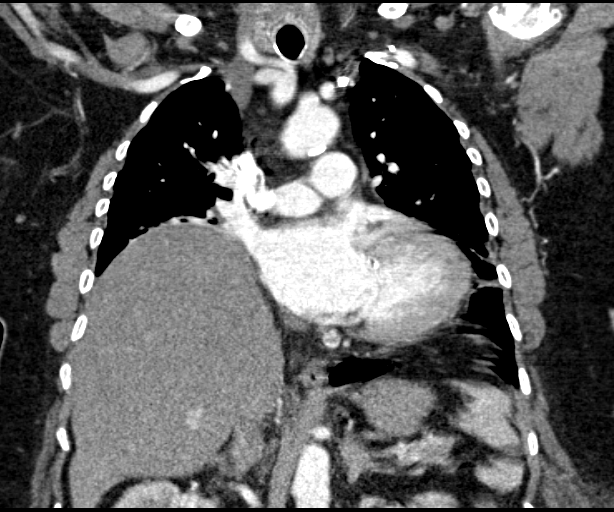

[18 of 36 positions shown; findings below may reference images not displayed]

FINDINGS: There are no filling defects within the pulmonary arteries to
suggest pulmonary embolus. Normal caliber thoracic aorta with mild
atherosclerosis. No aneurysmal dilatation or dissection.

There is elevation of the right hemidiaphragm with adjacent
compressive atelectasis in the right lower and right middle lobes.
Linear atelectasis in the superior right lower lobe. Linear and
patchy airspace opacity in the posterior left lower lobe. There is
mild central bronchial thickening. No findings to suggest pulmonary
edema.

The heart is mildly enlarged. No mediastinal or are hilar
lymphadenopathy. There is no axillary adenopathy. There is no
pleural or pericardial effusion.

Evaluation of the upper abdomen demonstrates decreased hepatic
density consistent with hepatic steatosis. No acute abnormality.

No acute osseous abnormality. No lytic or blastic osseous lesion.
There is multilevel degenerative change throughout the thoracic
spine.

Review of the MIP images confirms the above findings.
IMPRESSION: 1. No pulmonary embolus.
2. Linear and patchy opacity in the left lower lobe. This is
slightly greater than expected for atelectasis and may reflect
pneumonia. Aspiration could have a similar appearance. There is
compressive atelectasis in the right lower and right middle lobe
related to elevated right hemidiaphragm.

## 2015-10-15 DIAGNOSIS — H2513 Age-related nuclear cataract, bilateral: Secondary | ICD-10-CM | POA: Diagnosis not present

## 2015-10-22 ENCOUNTER — Other Ambulatory Visit: Payer: Self-pay | Admitting: General Surgery

## 2015-11-04 ENCOUNTER — Other Ambulatory Visit: Payer: Self-pay | Admitting: Family Medicine

## 2015-11-04 DIAGNOSIS — F32A Depression, unspecified: Secondary | ICD-10-CM

## 2015-11-04 DIAGNOSIS — F329 Major depressive disorder, single episode, unspecified: Secondary | ICD-10-CM

## 2015-11-05 DIAGNOSIS — G4733 Obstructive sleep apnea (adult) (pediatric): Secondary | ICD-10-CM | POA: Diagnosis not present

## 2015-11-05 NOTE — Telephone Encounter (Signed)
Printed, please fax or call in to pharmacy. Thank you.   

## 2015-11-10 ENCOUNTER — Encounter: Payer: Self-pay | Admitting: General Surgery

## 2015-11-10 ENCOUNTER — Other Ambulatory Visit: Payer: Self-pay | Admitting: Family Medicine

## 2015-11-10 DIAGNOSIS — R922 Inconclusive mammogram: Secondary | ICD-10-CM | POA: Diagnosis not present

## 2015-11-10 DIAGNOSIS — Z853 Personal history of malignant neoplasm of breast: Secondary | ICD-10-CM | POA: Diagnosis not present

## 2015-11-10 DIAGNOSIS — F329 Major depressive disorder, single episode, unspecified: Secondary | ICD-10-CM

## 2015-11-10 DIAGNOSIS — F32A Depression, unspecified: Secondary | ICD-10-CM

## 2015-11-10 NOTE — Telephone Encounter (Signed)
LOV 05/05/2015. Renaldo Fiddler, CMA

## 2015-11-17 ENCOUNTER — Encounter: Payer: Self-pay | Admitting: General Surgery

## 2015-11-17 ENCOUNTER — Ambulatory Visit (INDEPENDENT_AMBULATORY_CARE_PROVIDER_SITE_OTHER): Payer: Medicare Other | Admitting: General Surgery

## 2015-11-17 VITALS — BP 168/90 | HR 78 | Resp 14 | Ht 64.0 in | Wt 260.0 lb

## 2015-11-17 DIAGNOSIS — D0510 Intraductal carcinoma in situ of unspecified breast: Secondary | ICD-10-CM

## 2015-11-17 NOTE — Patient Instructions (Signed)
The patient has been asked to return to the office in one year with a bilateral diagnostic mammogram. 

## 2015-11-17 NOTE — Progress Notes (Signed)
Patient ID: Selena Bush, female   DOB: September 23, 1945, 70 y.o.   MRN: [id number]  Chief Complaint  Patient presents with  . Follow-up    mammogram    HPI Selena Bush is a 70 y.o. female who presents for a breast evaluation. The most recent mammogram was done on 11/10/15 .  Patient does perform regular self breast checks and gets regular mammograms done.    I personally reviewed the patient's history.  HPI  Past Medical History  Diagnosis Date  . Hypertension   . Diffuse cystic mastopathy 2014  . Anxiety   . Depression   . GERD (gastroesophageal reflux disease)   . Chronic diastolic CHF (congestive heart failure) ([place])     a. echo 09/2014: EF 123456, diastolic dysfunction, mild LVH, nl RV size & systolic function, mildly dilated LA (4.3 cm), mild MR/TR, mildly elevated PASP 36.7 mm Hg  . HLD (hyperlipidemia)     a. statin intolerant 2/2 myalgias  . Sleep difficulties     LUNESTA HAS HELPED  . OSA on CPAP   . Malignant neoplasm of upper-outer quadrant of female breast ([place]) 10/2012    Papillary DCIS, sentinel node negative. DR/PR positive. PARTIAL RIGHT MASTECTOMY FOR BREAST CANCER--HAD RADIATION - NO CHEMO --DR. [place] ONCOLOGIST  . Osteoarthritis of both knees     a. s/p right TKA 04/2013 & left TKA 09/2014  . Headache     rare  . DDD (degenerative disc disease), cervical   . DDD (degenerative disc disease), lumbar   . Obesity   . Otitis externa   . Vaginal cyst   . Hypercholesterolemia     Past Surgical History  Procedure Laterality Date  . Colonoscopy  2015     1 benign polyp-every 5 years  . Ercp  1995  . Cholecystectomy  1994  . Tubal ligation  1979  . Abdominal hysterectomy  1992  . Total knee arthroplasty Right 04/16/2013    Procedure: RIGHT TOTAL KNEE ARTHROPLASTY;  Surgeon: Mauri Pole, MD;  Location: [facility];  Service: Orthopedics;  Laterality: Right;  . Joint replacement Right Sept 2014    knee  . Total knee arthroplasty Left 09/15/2014   Procedure: LEFT TOTAL KNEE ARTHROPLASTY;  Surgeon: Mauri Pole, MD;  Location: [facility];  Service: Orthopedics;  Laterality: Left;  . Breast surgery Right March 2014    Wide excision,APB RT 10 mm papillary DCIS, ER/PR positive. Sentinel node negative. Partial breast radiation.    Family History  Problem Relation Age of Onset  . Colon cancer Maternal Grandfather   . Breast cancer Maternal Grandfather   . Colon cancer Mother   . Melanoma Father     Social History Social History  Substance Use Topics  . Smoking status: Never Smoker   . Smokeless tobacco: Never Used     Comment: social smoker as a teen  . Alcohol Use: No    Allergies  Allergen Reactions  . Codeine     Gi problems   . Penicillins     anaphylaxis    . Statins Other (See Comments)    Leg pains  . Sulfa Antibiotics     anaphylaxis     Current Outpatient Prescriptions  Medication Sig Dispense Refill  . ascorbic acid (VITAMIN C) 500 MG tablet Take 500 mg by mouth daily.     [name] atenolol (TENORMIN) 50 MG tablet TAKE 1 TABLET BY MOUTH EVERY DAY 90 tablet 3  . BELSOMRA 20 MG TABS TAKE  1 TABLET BY MOUTH EVERY EVENING 90 tablet 1  . beta carotene 25000 UNIT capsule Take 25,000 Units by mouth at bedtime.     . busPIRone (BUSPAR) 15 MG tablet TAKE 2 TABLETS BY MOUTH AT BEDTIME 180 tablet 1  . Calcium-Vitamin D 600-200 MG-UNIT per tablet Take 1 tablet by mouth daily.     . clindamycin (CLEOCIN) 300 MG capsule TK 2 CS PO 1 HOUR PRIOR TO TREATMENT  0  . DULoxetine (CYMBALTA) 60 MG capsule TAKE ONE CAPSULE BY MOUTH EVERY DAY 90 capsule 2  . fluticasone (FLONASE) 50 MCG/ACT nasal spray Place 2 sprays into both nostrils daily as needed for rhinitis or allergies. 16 g 5  . furosemide (LASIX) 20 MG tablet TAKE 1 TABLET BY MOUTH TWICE DAILY 180 tablet 1  . furosemide (LASIX) 40 MG tablet Take 40 mg by mouth daily.     [name] ibuprofen (ADVIL,MOTRIN) 200 MG tablet Take by mouth as needed.    [name] lisinopril-hydrochlorothiazide  (PRINZIDE,ZESTORETIC) 20-12.5 MG tablet TAKE 1 TABLET BY MOUTH EVERY DAY 90 tablet 1  . LORazepam (ATIVAN) 0.5 MG tablet TAKE 1 TABLET BY MOUTH TWICE DAILY AS NEEDED 60 tablet 5  . NEXIUM 40 MG capsule Take 40 mg by mouth every morning.     . Omega-3 Fatty Acids (FISH OIL) 1200 MG CAPS Take by mouth 2 (two) times daily.    . potassium chloride (MICRO-K) 10 MEQ CR capsule TAKE 1 CAPSULE BY MOUTH TWICE DAILY 180 capsule 0  . spironolactone (ALDACTONE) 25 MG tablet TAKE 1 TABLET BY MOUTH EVERY DAY 90 tablet 1  . tamoxifen (NOLVADEX) 20 MG tablet TAKE 1 TABLET BY MOUTH EVERY DAY 90 tablet 0  . tiZANidine (ZANAFLEX) 4 MG tablet TAKE 1 TABLET BY MOUTH AT SUPPERTIME AND 1 TABLET AT BEDTIME 180 tablet 1  . vitamin A 10000 UNIT capsule Take by mouth.    . Vitamin E 400 UNITS TABS Take by mouth.     No current facility-administered medications for this visit.    Review of Systems Review of Systems  Constitutional: Negative.   Respiratory: Negative.   Cardiovascular: Negative.     Blood pressure 168/90, pulse 78, resp. rate 14, height 5\' 4"  (1.626 m), weight 260 lb (117.935 kg).  Physical Exam Physical Exam  Constitutional: She is oriented to person, place, and time. She appears well-developed and well-nourished.  Eyes: Conjunctivae are normal. No scleral icterus.  Neck: Neck supple.  Cardiovascular: Normal rate, regular rhythm and normal heart sounds.   Pulmonary/Chest: Effort normal and breath sounds normal. Right breast exhibits no inverted nipple, no mass, no nipple discharge, no skin change and no tenderness. Left breast exhibits no inverted nipple, no mass, no nipple discharge, no skin change and no tenderness.    Right breast upper outer quadrant well healed incision.focal thickening 3 cm 12 oclock  Abdominal: Soft. Bowel sounds are normal. There is no tenderness.  Lymphadenopathy:    She has no cervical adenopathy.    She has no axillary adenopathy.  Neurological: She is alert and  oriented to person, place, and time.  Skin: Skin is warm and dry.    Data Reviewed Bilateral diagnostic mammograms dated 11/10/2015 were reviewed. Minimal architectural changes in the area of wide excision. No calcifications consistent with fat necrosis have yet to develop. BI-RADS-2  Assessment    Doing well now 3 years out from wide excision of a papillary DCIS.    Plan    The changes near the surgical  site is consistent with previous surgery and accelerated partial breast radiation.   The patient has been asked to return to the office in one year with a bilateral diagnostic mammogram. PCP:  Margarita Rana This information has been scribed by Gaspar Cola CMA.    Robert Bellow 11/18/2015, 4:02 PM

## 2015-11-18 ENCOUNTER — Encounter: Payer: Self-pay | Admitting: General Surgery

## 2015-12-08 DIAGNOSIS — H2512 Age-related nuclear cataract, left eye: Secondary | ICD-10-CM | POA: Diagnosis not present

## 2015-12-08 DIAGNOSIS — H25012 Cortical age-related cataract, left eye: Secondary | ICD-10-CM | POA: Diagnosis not present

## 2015-12-08 DIAGNOSIS — H2511 Age-related nuclear cataract, right eye: Secondary | ICD-10-CM | POA: Diagnosis not present

## 2015-12-08 DIAGNOSIS — H25011 Cortical age-related cataract, right eye: Secondary | ICD-10-CM | POA: Diagnosis not present

## 2015-12-28 DIAGNOSIS — H25811 Combined forms of age-related cataract, right eye: Secondary | ICD-10-CM | POA: Diagnosis not present

## 2015-12-28 DIAGNOSIS — H25812 Combined forms of age-related cataract, left eye: Secondary | ICD-10-CM | POA: Diagnosis not present

## 2015-12-28 DIAGNOSIS — H2511 Age-related nuclear cataract, right eye: Secondary | ICD-10-CM | POA: Diagnosis not present

## 2015-12-29 DIAGNOSIS — H2512 Age-related nuclear cataract, left eye: Secondary | ICD-10-CM | POA: Diagnosis not present

## 2016-01-04 DIAGNOSIS — Z961 Presence of intraocular lens: Secondary | ICD-10-CM | POA: Diagnosis not present

## 2016-01-04 DIAGNOSIS — H2511 Age-related nuclear cataract, right eye: Secondary | ICD-10-CM | POA: Diagnosis not present

## 2016-01-12 ENCOUNTER — Ambulatory Visit: Payer: Medicare Other | Admitting: General Surgery

## 2016-01-16 ENCOUNTER — Other Ambulatory Visit: Payer: Self-pay | Admitting: Family Medicine

## 2016-01-16 DIAGNOSIS — G4700 Insomnia, unspecified: Secondary | ICD-10-CM

## 2016-01-18 DIAGNOSIS — H25812 Combined forms of age-related cataract, left eye: Secondary | ICD-10-CM | POA: Diagnosis not present

## 2016-01-18 DIAGNOSIS — Z961 Presence of intraocular lens: Secondary | ICD-10-CM | POA: Diagnosis not present

## 2016-01-18 DIAGNOSIS — H2512 Age-related nuclear cataract, left eye: Secondary | ICD-10-CM | POA: Diagnosis not present

## 2016-01-18 DIAGNOSIS — H2513 Age-related nuclear cataract, bilateral: Secondary | ICD-10-CM | POA: Diagnosis not present

## 2016-01-18 NOTE — Telephone Encounter (Signed)
Printed, please fax or call in to pharmacy. Thank you.   

## 2016-01-29 ENCOUNTER — Other Ambulatory Visit: Payer: Self-pay | Admitting: General Surgery

## 2016-02-01 ENCOUNTER — Telehealth: Payer: Self-pay

## 2016-02-01 DIAGNOSIS — D0510 Intraductal carcinoma in situ of unspecified breast: Secondary | ICD-10-CM

## 2016-02-01 NOTE — Telephone Encounter (Signed)
-----   Message from Robert Bellow, MD sent at 01/31/2016  7:30 AM EDT ----- Contact the patient and see when she stopped Effexor (venlafaxine XR and started Cymbalta (Duloxetine). Latter does not play well with Tamoxifen.  Thanks.  Will contact her re: med options.

## 2016-02-01 NOTE — Telephone Encounter (Signed)
The patient states that she has been on the Cymbalta for the past 6 months. She was taken off of the Effexor due to elevated blood pressures. Since being on the Cymbalta her blood pressure had done very well. She reports no adverse side effects since being on the Cymbalta. She states that she only has one dose of her Tamoxifen left at this time and was concerned about her refill.

## 2016-02-02 MED ORDER — RALOXIFENE HCL 60 MG PO TABS: 60.0000 mg | ORAL_TABLET | Freq: Every day | ORAL | Status: DC

## 2016-02-02 NOTE — Telephone Encounter (Signed)
History reviewed. Regarding change from Effexor 2 Cymbalta. Patient report blood pressure under better control with recent change to Cymbalta for her antidepressant medication.  Contraindication of Cymbalta with tamoxifen reviewed.  Patient had a papillary carcinoma, likely similar and wrist 2 DCIS.  Rather than take a risk of osteoporosis in the patient at high risk, we'll change from tamoxifen to Evista, 60 mg daily. While this is "off label" as of this disease approved for chemoprevention I think that this is a reasonable change and there is no significant cross-reactivity with Cymbalta.

## 2016-02-02 NOTE — Telephone Encounter (Signed)
-----   Message from Robert Bellow, MD sent at 02/02/2016 1:15 PM EDT -----    Please notify the patient that a prescription for raloxifene (Evista) has been sent to her pharmacy to be used once a day in place of tamoxifen.     Notified patient as instructed, patient pleased. Discussed follow-up appointments, patient agrees.

## 2016-02-06 ENCOUNTER — Ambulatory Visit (INDEPENDENT_AMBULATORY_CARE_PROVIDER_SITE_OTHER): Payer: Medicare Other | Admitting: Family Medicine

## 2016-02-06 VITALS — BP 124/70 | HR 64 | Temp 98.0°F | Resp 17 | Ht 64.0 in | Wt 252.2 lb

## 2016-02-06 DIAGNOSIS — R739 Hyperglycemia, unspecified: Secondary | ICD-10-CM

## 2016-02-06 DIAGNOSIS — F329 Major depressive disorder, single episode, unspecified: Secondary | ICD-10-CM

## 2016-02-06 DIAGNOSIS — G4700 Insomnia, unspecified: Secondary | ICD-10-CM

## 2016-02-06 DIAGNOSIS — D0510 Intraductal carcinoma in situ of unspecified breast: Secondary | ICD-10-CM

## 2016-02-06 DIAGNOSIS — I10 Essential (primary) hypertension: Secondary | ICD-10-CM

## 2016-02-06 DIAGNOSIS — Z23 Encounter for immunization: Secondary | ICD-10-CM

## 2016-02-06 DIAGNOSIS — G4730 Sleep apnea, unspecified: Secondary | ICD-10-CM | POA: Diagnosis not present

## 2016-02-06 DIAGNOSIS — F32A Depression, unspecified: Secondary | ICD-10-CM

## 2016-02-06 DIAGNOSIS — I5032 Chronic diastolic (congestive) heart failure: Secondary | ICD-10-CM | POA: Diagnosis not present

## 2016-02-06 DIAGNOSIS — I340 Nonrheumatic mitral (valve) insufficiency: Secondary | ICD-10-CM | POA: Diagnosis not present

## 2016-02-06 DIAGNOSIS — D0511 Intraductal carcinoma in situ of right breast: Secondary | ICD-10-CM | POA: Diagnosis not present

## 2016-02-06 DIAGNOSIS — K219 Gastro-esophageal reflux disease without esophagitis: Secondary | ICD-10-CM

## 2016-02-06 MED ORDER — ZOSTER VACCINE LIVE 19400 UNT/0.65ML ~~LOC~~ SUSR: 0.6500 mL | Freq: Once | SUBCUTANEOUS | Status: DC

## 2016-02-06 MED ORDER — DULOXETINE HCL 60 MG PO CPEP: 120.0000 mg | ORAL_CAPSULE | Freq: Every day | ORAL | Status: DC

## 2016-02-06 NOTE — Progress Notes (Signed)
Subjective:    Patient ID: Selena Bush, female    DOB: 20-Jan-1946, 70 y.o.   MRN: [id number]  02/06/2016  patient would not go into detail on why she was here and PHQ-9   HPI This 70 y.o. female presents for the following:   1. HTN: Patient reports good compliance with medication, good tolerance to medication, and good symptom control.   Does not check BP at home.  Lisinopril/HCTZ 20-12.5, Atenolol 50mg  daily.Spironolactone.    2.  Breast cancer: taking Tamoxifen; partial mastectomy with radiation three years ago.  Recent change Arimidex.  Followed by [facility]/General Surgery.   3.  Anxiety and depression: husband died in Sep 18, 2015.   Just moved to a different place; too many memories; had raised children in the home.  Moved to [place].  Not happy to live without husband.  Miserable.  Psychiatric social worker.  Not interested in counseling.  Maintain stability.  Stopped Effexor and switched to Cymbalta.  Cymbalta interacted with Tamoxifen.  Lorazepam 2 at bedtime. Tizanidine.     4. GERD: Patient reports good compliance with medication, good tolerance to medication, and good symptom control.    5.  Allergic Rhinitis: Patient reports good compliance with medication, good tolerance to medication, and good symptom control.    6.  Venous statis: Lasix PRN; better since retirement.    Review of Systems  Constitutional: Negative for fever, chills, diaphoresis and fatigue.  Eyes: Negative for visual disturbance.  Respiratory: Negative for cough and shortness of breath.   Cardiovascular: Negative for chest pain, palpitations and leg swelling.  Gastrointestinal: Negative for nausea, vomiting, abdominal pain, diarrhea and constipation.  Endocrine: Negative for cold intolerance, heat intolerance, polydipsia, polyphagia and polyuria.  Neurological: Negative for dizziness, tremors, seizures, syncope, facial asymmetry, speech difficulty, weakness, light-headedness, numbness and  headaches.  Psychiatric/Behavioral: Positive for sleep disturbance and dysphoric mood. Negative for suicidal ideas and self-injury. The patient is nervous/anxious.     Past Medical History  Diagnosis Date  . Hypertension   . Diffuse cystic mastopathy 2014  . Anxiety   . Depression   . GERD (gastroesophageal reflux disease)   . Chronic diastolic CHF (congestive heart failure) ([place])     a. echo 09/17/2014: EF 123456, diastolic dysfunction, mild LVH, nl RV size & systolic function, mildly dilated LA (4.3 cm), mild MR/TR, mildly elevated PASP 36.7 mm Hg  . HLD (hyperlipidemia)     a. statin intolerant 2/2 myalgias  . Sleep difficulties     LUNESTA HAS HELPED  . OSA on CPAP   . Malignant neoplasm of upper-outer quadrant of female breast ([place]) 10/2012    Papillary DCIS, sentinel node negative. DR/PR positive. PARTIAL RIGHT MASTECTOMY FOR BREAST CANCER--HAD RADIATION - NO CHEMO --DR. [place] ONCOLOGIST  . Osteoarthritis of both knees     a. s/p right TKA 04/2013 & left TKA 09/17/2014  . Headache     rare  . DDD (degenerative disc disease), cervical   . DDD (degenerative disc disease), lumbar   . Obesity   . Otitis externa   . Vaginal cyst   . Hypercholesterolemia    Past Surgical History  Procedure Laterality Date  . Colonoscopy  2015     1 benign polyp-every 5 years  . Ercp  1995  . Cholecystectomy  1994  . Tubal ligation  1979  . Total knee arthroplasty Right 04/16/2013    Procedure: RIGHT TOTAL KNEE ARTHROPLASTY;  Surgeon: Mauri Pole, MD;  Location: [facility];  Service: Orthopedics;  Laterality: Right;  . Total knee arthroplasty Left 09/15/2014    Procedure: LEFT TOTAL KNEE ARTHROPLASTY;  Surgeon: Mauri Pole, MD;  Location: [facility];  Service: Orthopedics;  Laterality: Left;  . Breast surgery Right March 2014    Wide excision,APB RT 10 mm papillary DCIS, ER/PR positive. Sentinel node negative. Partial breast radiation.  . Abdominal hysterectomy  1992    DUB; fibroids;  endometriosis.  One remaining ovary.    . Joint replacement Right Sept 2014    knee   Allergies  Allergen Reactions  . Asa [Aspirin]   . Codeine     Gi problems   . Penicillins     anaphylaxis    . Statins Other (See Comments)    Leg pains  . Sulfa Antibiotics     anaphylaxis     Social History   Social History  . Marital Status: Widowed    Spouse Name: Chrissie Noa  . Number of Children: 2  . Years of Education: College   Occupational History  . Retired    Social History Main Topics  . Smoking status: Never Smoker   . Smokeless tobacco: Never Used     Comment: social smoker as a teen  . Alcohol Use: No  . Drug Use: No  . Sexual Activity: Not on file   Other Topics Concern  . Not on file   Social History Narrative   Marital status: widowed      Children: 2 children; 1 grandchild      Lives: alone in townhome      Employment: psychiatric Education officer, [employer]; retired in 2015      Tobacco: teenager only      Alcohol:  None      Drugs: none      Exercise: none in 2017.   Family History  Problem Relation Age of Onset  . Colon cancer Maternal Grandfather   . Breast cancer Maternal Grandfather   . Colon cancer Mother   . Melanoma Father        Objective:    BP 124/70 mmHg  Pulse 64  Temp(Src) 98 F (36.7 C) (Oral)  Resp 17  Ht 5\' 4"  (1.626 m)  Wt 252 lb 3.2 oz (114.397 kg)  BMI 43.27 kg/m2  SpO2 94% Physical Exam  Constitutional: She is oriented to person, place, and time. She appears well-developed and well-nourished. No distress.  HENT:  Head: Normocephalic and atraumatic.  Right Ear: External ear normal.  Left Ear: External ear normal.  Nose: Nose normal.  Mouth/Throat: Oropharynx is clear and moist.  Eyes: Conjunctivae and EOM are normal. Pupils are equal, round, and reactive to light.  Neck: Normal range of motion. Neck supple. Carotid bruit is not present. No thyromegaly present.  Cardiovascular: Normal rate, regular rhythm, normal heart sounds and  intact distal pulses.  Exam reveals no gallop and no friction rub.   No murmur heard. Pulmonary/Chest: Effort normal and breath sounds normal. She has no wheezes. She has no rales.  Abdominal: Soft. Bowel sounds are normal. She exhibits no distension and no mass. There is no tenderness. There is no rebound and no guarding.  Lymphadenopathy:    She has no cervical adenopathy.  Neurological: She is alert and oriented to person, place, and time. No cranial nerve deficit.  Skin: Skin is warm and dry. No rash noted. She is not diaphoretic. No erythema. No pallor.  Psychiatric: She has a normal mood and affect. Her behavior is normal.  Results for orders placed or performed in visit on 10/30/15  HM COLONOSCOPY  Result Value Ref Range   HM Colonoscopy WNL        Assessment & Plan:   1. Depression   2. Benign essential HTN   3. Hyperglycemia   4. Gastroesophageal reflux disease without esophagitis   5. Sleep apnea   6. DCIS (ductal carcinoma in situ) of breast, unspecified laterality   7. DCIS (ductal carcinoma in situ), right   8. Morbid obesity due to excess calories ([facility])   9. MI (mitral incompetence)   10. Chronic diastolic CHF (congestive heart failure) ([place])   11. Insomnia     Orders Placed This Encounter  Procedures  . Pneumococcal conjugate vaccine 13-valent IM   Meds ordered this encounter  Medications  . Zoster Vaccine Live, PF, (ZOSTAVAX) 29562 UNT/0.65ML injection    Sig: Inject 19,400 Units into the skin once.    Dispense:  0.65 mL    Refill:  0  . DULoxetine (CYMBALTA) 60 MG capsule    Sig: Take 2 capsules (120 mg total) by mouth daily.    Dispense:  180 capsule    Refill:  3    Return in about 4 months (around 06/07/2016) for complete physical examiniation.    Dannah Ryles Elayne Guerin, M.D. [facility] [address] [place], [place]  [address] [phone] phone [phone] fax

## 2016-02-13 HISTORY — PX: CATARACT EXTRACTION, BILATERAL: SHX1313

## 2016-02-17 ENCOUNTER — Other Ambulatory Visit: Payer: Self-pay

## 2016-02-17 DIAGNOSIS — I5032 Chronic diastolic (congestive) heart failure: Secondary | ICD-10-CM

## 2016-02-17 DIAGNOSIS — I10 Essential (primary) hypertension: Secondary | ICD-10-CM

## 2016-02-17 NOTE — Telephone Encounter (Signed)
Dr Tamala Julian  Patient just lost her husband and needs he BP meds refill   lisinopril-hydrochlorothiazide (PRINZIDE,ZESTORETIC) 20-12.5 MG tablet [facility] [address]

## 2016-02-17 NOTE — Telephone Encounter (Signed)
Pt says she needs a refill on her spironolactone (ALDACTONE) 25 MG tablet [id number]. She seems a bit confused, she has just lost her husband.

## 2016-02-18 NOTE — Telephone Encounter (Signed)
Dr Tamala Julian, you saw pt for HTN and other chronic issues on 6/24 but don't see that we have rxd this for pt before. OK to send RFs?

## 2016-02-19 MED ORDER — SPIRONOLACTONE 25 MG PO TABS: 25.0000 mg | ORAL_TABLET | Freq: Every day | ORAL | Status: DC

## 2016-02-19 MED ORDER — LISINOPRIL-HYDROCHLOROTHIAZIDE 20-12.5 MG PO TABS: 1.0000 | ORAL_TABLET | Freq: Every day | ORAL | Status: DC

## 2016-02-19 NOTE — Telephone Encounter (Signed)
Refills approved.

## 2016-02-25 ENCOUNTER — Telehealth: Payer: Self-pay

## 2016-02-25 DIAGNOSIS — I10 Essential (primary) hypertension: Secondary | ICD-10-CM

## 2016-02-25 NOTE — Telephone Encounter (Signed)
Pt is needing to talk with someone about getting her potassium refilled   Best numebr [phone]

## 2016-03-02 ENCOUNTER — Telehealth: Payer: Self-pay

## 2016-03-02 MED ORDER — POTASSIUM CHLORIDE ER 10 MEQ PO CPCR: 10.0000 meq | ORAL_CAPSULE | Freq: Two times a day (BID) | ORAL | Status: DC

## 2016-03-02 NOTE — Telephone Encounter (Signed)
[facility] interaction   Best number [phone]

## 2016-03-02 NOTE — Telephone Encounter (Signed)
Is she ok to take the potassium Rx? The pharmacist said something about spirolactone and this may cause hyperkalemia.

## 2016-03-02 NOTE — Telephone Encounter (Signed)
Spoke with Dr. Tamala Julian and she agrees with pharmacist. I discontinued medication at the pharmacy. And advised pt to discuss with Dr. Tamala Julian at the next office visit.

## 2016-03-02 NOTE — Telephone Encounter (Signed)
Left message for pt to call back  °

## 2016-03-02 NOTE — Telephone Encounter (Signed)
Rx sent 

## 2016-03-18 ENCOUNTER — Telehealth: Payer: Self-pay

## 2016-03-18 NOTE — Telephone Encounter (Addendum)
PATIENT STATES DR. Steffanie Dunn SMITH WILL BE HER PCP SINCE HER DOCTOR  (NANCY MALONEY) MOVED OUT OF TOWN. SHE HAS AN APPOINTMENT TO SEE DR. Tamala Julian ON OCT. 24,2017, BUT IN THE MEANTIME SHE WILL RUN COMPLETELY OUT OF HER SLEEPING MEDICATION ON Monday 03/21/2016. SHE TAKES BELSOMRA 20 MG. [facility] TO REFILL THIS FOR HER. SHE SAID WHEN SHE SAW DR. Tamala Julian 1 TIME BEFORE SHE TOLD HER THAT SHE WOULD BE HAPPY TO BE HER PCP. BEST PHONE [phone] (CELL)  [facility] [address].  [place]

## 2016-03-21 ENCOUNTER — Ambulatory Visit (INDEPENDENT_AMBULATORY_CARE_PROVIDER_SITE_OTHER): Payer: Medicare Other | Admitting: Family Medicine

## 2016-03-21 ENCOUNTER — Encounter: Payer: Self-pay | Admitting: Family Medicine

## 2016-03-21 DIAGNOSIS — F329 Major depressive disorder, single episode, unspecified: Secondary | ICD-10-CM

## 2016-03-21 DIAGNOSIS — F32A Depression, unspecified: Secondary | ICD-10-CM

## 2016-03-21 DIAGNOSIS — G4700 Insomnia, unspecified: Secondary | ICD-10-CM

## 2016-03-21 DIAGNOSIS — F419 Anxiety disorder, unspecified: Secondary | ICD-10-CM | POA: Diagnosis not present

## 2016-03-21 MED ORDER — SUVOREXANT 20 MG PO TABS: 1.0000 | ORAL_TABLET | Freq: Every day | ORAL | 2 refills | Status: DC

## 2016-03-21 MED ORDER — LORAZEPAM 0.5 MG PO TABS
0.5000 mg | ORAL_TABLET | Freq: Two times a day (BID) | ORAL | 2 refills | Status: DC | PRN
Start: 2016-03-21 — End: 2016-06-07

## 2016-03-21 NOTE — Assessment & Plan Note (Signed)
Complicated grief and anxiety seem to be inhibiting her daily living. She reports her hormone replacement was changed to raloxifen so she is contining to take her cymbalta.  - consider incraseing cymbalta to 90 mg  - consider GAD-7 and PHQ-9 at follow up.

## 2016-03-21 NOTE — Assessment & Plan Note (Signed)
She has suffered from insomnia for several years. Reports several different methods for treatment and seems exacerbated by the loss of her husband.  - refill her medications  - she has a physical in October to further discuss these issues.

## 2016-03-21 NOTE — Progress Notes (Signed)
[facility] [address] [place] [address] [phone]  Date:  03/21/2016   Name:  Selena Bush   DOB:  May 29, 1946   MRN:  [id number]  PCP:  Reginia Forts, MD    Chief Complaint: Medication Refill (belsomra,ativan)   History of Present Illness:  This is a 70 y.o. female who is presenting with medication refills. She has switched to [facility] recently. She reports taking the belsomra for insomnia. She has been on the belsomra. The insomnia has been a life long process. As she got older, it became worse. She has tried [place].  Has tried melatonin. Uses a cpap every night.   Anxiety/Grief: she uses ativan at night. She reports hypervigilant. Reports she was able to sleep with these two medications. Reports being on the ativan for about one year. She is still taking cymbalta.  Reports she has difficulty functioning in everyday life since her husband has passed. She cries throughout the day. She hasn't sough anyone for help. She denies that any help will help her. She doesn't like being alive right now. She doesn't see much good in life right now. She has thoughts of hurting herself. She has made a plan one time. She has made a fair contract for safety. Deep down she doesn't want to die. Reports she was initially started on ativan when she was in the hospital after having surgery on her knee.   Review of Systems: ROS: No unexpected weight loss, fever, chills, swelling, instability, muscle pain, numbness/tingling, redness, otherwise see HPI   PMH: breast cancer (partial mastectomy), HTN, anxiety and depression PShx: retired for almost two years.   Physical Examination: Physical Exam BP 128/86 (BP Location: Right Arm, Patient Position: Sitting, Cuff Size: Large)   Pulse 94   Temp 98.5 F (36.9 C) (Oral)   Resp 18   Ht 5\' 4"  (1.626 m)   Wt 250 lb (113.4 kg)   SpO2 95%   BMI 42.91 kg/m  Gen: NAD, alert, cooperative with exam, obese HEENT: NCAT, EOMI, clear  conjunctiva, supple neck CV: RRR, good S1/S2, no murmur, no edema, capillary refill brisk  Resp: CTABL, no wheezes, non-labored MSK: normal gait  Skin: no rashes, normal turgor  Neuro: no gross deficits.  Psych: alert and oriented  Assessment and Plan:  Insomnia She has suffered from insomnia for several years. Reports several different methods for treatment and seems exacerbated by the loss of her husband.  - refill her medications  - she has a physical in October to further discuss these issues.   Anxiety Complicated grief and anxiety seem to be inhibiting her daily living. She reports her hormone replacement was changed to raloxifen so she is contining to take her cymbalta.  - consider incraseing cymbalta to 90 mg  - consider GAD-7 and PHQ-9 at follow up.

## 2016-03-21 NOTE — Progress Notes (Signed)
Patient discussed with Dr. Raeford Razor. Agree with assessment and plan of care per his note. Follow up planned with Dr. Tamala Julian. Phone number provided for Hospice counseling if she is interested.

## 2016-03-21 NOTE — Patient Instructions (Addendum)
Thank you for coming in,   Please be sure to follow up with your physical in October.   Hospice group counseling is always available [phone]    Please feel free to call with any questions or concerns at any time, at [phone]. --Dr. Raeford Razor     IF you received an x-ray today, you will receive an invoice from [facility] Rad[facility]. Please contact [facility] Radiology at [phone] with questions or concerns regarding your invoice.   IF you received labwork today, you will receive an invoice from [employer]. Please contact [facility] at [phone] with questions or concerns regarding your invoice.   Our billing staff will not be able to assist you with questions regarding bills from these companies.  You will be contacted with the lab results as soon as they are available. The fastest way to get your results is to activate your My Chart account. Instructions are located on the last page of this paperwork. If you have not heard from [place] regarding the results in 2 weeks, please contact this office.

## 2016-03-21 NOTE — Telephone Encounter (Signed)
Rx approved

## 2016-03-24 ENCOUNTER — Other Ambulatory Visit: Payer: Self-pay | Admitting: Family Medicine

## 2016-03-24 DIAGNOSIS — I10 Essential (primary) hypertension: Secondary | ICD-10-CM

## 2016-03-28 ENCOUNTER — Telehealth: Payer: Self-pay

## 2016-03-28 DIAGNOSIS — F32A Depression, unspecified: Secondary | ICD-10-CM

## 2016-03-28 DIAGNOSIS — I10 Essential (primary) hypertension: Secondary | ICD-10-CM

## 2016-03-28 DIAGNOSIS — F329 Major depressive disorder, single episode, unspecified: Secondary | ICD-10-CM

## 2016-03-28 NOTE — Telephone Encounter (Signed)
Pt is needing a refill on atenolol 50 mg   Best number [phone]

## 2016-03-29 ENCOUNTER — Telehealth: Payer: Self-pay

## 2016-03-29 NOTE — Telephone Encounter (Signed)
Pt is also needing a refill on busprone 15 mg 180 tab

## 2016-03-31 ENCOUNTER — Other Ambulatory Visit: Payer: Self-pay | Admitting: Family Medicine

## 2016-03-31 DIAGNOSIS — F329 Major depressive disorder, single episode, unspecified: Secondary | ICD-10-CM

## 2016-03-31 DIAGNOSIS — F32A Depression, unspecified: Secondary | ICD-10-CM

## 2016-03-31 NOTE — Telephone Encounter (Signed)
See 8/14 message.

## 2016-03-31 NOTE — Telephone Encounter (Addendum)
Pt is calling checking on status of this message. She has been out of this med  for 2 days. CB # [phone]

## 2016-03-31 NOTE — Telephone Encounter (Signed)
Dr Carlota Raspberry, pt is requesting RFs of atenolol and buspar both. You have seen pt recently for depression, but don't think you discussed HTN. Dr Tamala Julian is not in the office this afternoon, and I didn't want the pt to go without any longer. We have not Rxd either of these meds before, but pt's prev PCP has moved and pt is going to be seeing Dr Tamala Julian now. Dr Tamala Julian did discuss HTN meds at 6/24 OV to est care. Could you possibly address the req for atenolol please? Thank you. I will pend both meds on this encounter to combine.

## 2016-04-01 MED ORDER — BUSPIRONE HCL 15 MG PO TABS: 30.0000 mg | ORAL_TABLET | Freq: Every day | ORAL | 0 refills | Status: DC

## 2016-04-01 MED ORDER — ATENOLOL 50 MG PO TABS: 50.0000 mg | ORAL_TABLET | Freq: Every day | ORAL | 0 refills | Status: DC

## 2016-04-01 NOTE — Telephone Encounter (Signed)
I refilled them, but it appears that Dr. Raeford Razor saw the patient last, and did put her on Belomra at night. If she is taking BuSpar to help with sleep, would try just belsomra. Atenolol refilled. Follow-up with Dr. Tamala Julian to discuss these medications further within the next 3 months.

## 2016-04-02 NOTE — Telephone Encounter (Signed)
Patient is upset that we sent her medication to the wrong pharmacy. [facility] [address] in [place]

## 2016-04-04 NOTE — Telephone Encounter (Signed)
Tried to call pt to make sure that she was aware that Dr Carlota Raspberry refilled both of her meds. No ans/no VM. Asked me for remote access code? If pt calls back and [facility] of [facility] [place] that was the most recent in the system, the pharmacies can transfer them over, which would be the least complicated due to cancelling it out of ins from [facility]. But if pt prefers, I can re-send if she has not already picked them up.

## 2016-05-19 ENCOUNTER — Telehealth: Payer: Self-pay

## 2016-05-19 NOTE — Telephone Encounter (Signed)
Vicente Males the pharmacist from [facility] would like to know if pt is suppose to take spironolactone (ALDACTONE) 25 MG tablet [id number] and furosemide (LASIX) 20 MG tablet [id number] in combination with each other. Please advise at [phone]

## 2016-05-19 NOTE — Telephone Encounter (Signed)
Spoke with Selena Bush to clarify if patient should take Lasix and Aldactone together.  Clarified- Pt should only be taking Aldactone

## 2016-06-07 ENCOUNTER — Ambulatory Visit (INDEPENDENT_AMBULATORY_CARE_PROVIDER_SITE_OTHER): Payer: Medicare Other | Admitting: Family Medicine

## 2016-06-07 ENCOUNTER — Encounter: Payer: Self-pay | Admitting: Family Medicine

## 2016-06-07 ENCOUNTER — Telehealth: Payer: Self-pay

## 2016-06-07 VITALS — BP 140/92 | HR 62 | Temp 98.3°F | Resp 20 | Ht 62.0 in | Wt 247.2 lb

## 2016-06-07 DIAGNOSIS — Z1159 Encounter for screening for other viral diseases: Secondary | ICD-10-CM | POA: Diagnosis not present

## 2016-06-07 DIAGNOSIS — G4733 Obstructive sleep apnea (adult) (pediatric): Secondary | ICD-10-CM | POA: Diagnosis not present

## 2016-06-07 DIAGNOSIS — I5032 Chronic diastolic (congestive) heart failure: Secondary | ICD-10-CM | POA: Diagnosis not present

## 2016-06-07 DIAGNOSIS — E7800 Pure hypercholesterolemia, unspecified: Secondary | ICD-10-CM

## 2016-06-07 DIAGNOSIS — Z23 Encounter for immunization: Secondary | ICD-10-CM | POA: Diagnosis not present

## 2016-06-07 DIAGNOSIS — F419 Anxiety disorder, unspecified: Secondary | ICD-10-CM | POA: Diagnosis not present

## 2016-06-07 DIAGNOSIS — I10 Essential (primary) hypertension: Secondary | ICD-10-CM | POA: Diagnosis not present

## 2016-06-07 DIAGNOSIS — F329 Major depressive disorder, single episode, unspecified: Secondary | ICD-10-CM | POA: Diagnosis not present

## 2016-06-07 DIAGNOSIS — F5104 Psychophysiologic insomnia: Secondary | ICD-10-CM | POA: Diagnosis not present

## 2016-06-07 DIAGNOSIS — K219 Gastro-esophageal reflux disease without esophagitis: Secondary | ICD-10-CM

## 2016-06-07 DIAGNOSIS — R739 Hyperglycemia, unspecified: Secondary | ICD-10-CM | POA: Diagnosis not present

## 2016-06-07 DIAGNOSIS — M62838 Other muscle spasm: Secondary | ICD-10-CM

## 2016-06-07 DIAGNOSIS — M170 Bilateral primary osteoarthritis of knee: Secondary | ICD-10-CM | POA: Diagnosis not present

## 2016-06-07 LAB — CBC WITH DIFFERENTIAL/PLATELET
BASOS ABS: 61 {cells}/uL (ref 0–200)
Basophils Relative: 1 %
EOS PCT: 3 %
Eosinophils Absolute: 183 cells/uL (ref 15–500)
HEMATOCRIT: 35.2 % (ref 35.0–45.0)
HEMOGLOBIN: 11.6 g/dL — AB (ref 11.7–15.5)
LYMPHS ABS: 1220 {cells}/uL (ref 850–3900)
Lymphocytes Relative: 20 %
MCH: 28 pg (ref 27.0–33.0)
MCHC: 33 g/dL (ref 32.0–36.0)
MCV: 85 fL (ref 80.0–100.0)
MONO ABS: 488 {cells}/uL (ref 200–950)
MPV: 8.7 fL (ref 7.5–12.5)
Monocytes Relative: 8 %
NEUTROS ABS: 4148 {cells}/uL (ref 1500–7800)
NEUTROS PCT: 68 %
Platelets: 291 10*3/uL (ref 140–400)
RBC: 4.14 MIL/uL (ref 3.80–5.10)
RDW: 13.6 % (ref 11.0–15.0)
WBC: 6.1 10*3/uL (ref 3.8–10.8)

## 2016-06-07 LAB — COMPREHENSIVE METABOLIC PANEL
ALBUMIN: 4.1 g/dL (ref 3.6–5.1)
ALT: 13 U/L (ref 6–29)
AST: 18 U/L (ref 10–35)
Alkaline Phosphatase: 55 U/L (ref 33–130)
BILIRUBIN TOTAL: 0.4 mg/dL (ref 0.2–1.2)
BUN: 15 mg/dL (ref 7–25)
CALCIUM: 9.3 mg/dL (ref 8.6–10.4)
CHLORIDE: 94 mmol/L — AB (ref 98–110)
CO2: 26 mmol/L (ref 20–31)
CREATININE: 1.19 mg/dL — AB (ref 0.50–0.99)
Glucose, Bld: 93 mg/dL (ref 65–99)
Potassium: 4.4 mmol/L (ref 3.5–5.3)
SODIUM: 131 mmol/L — AB (ref 135–146)
TOTAL PROTEIN: 6.7 g/dL (ref 6.1–8.1)

## 2016-06-07 LAB — LIPID PANEL
CHOLESTEROL: 218 mg/dL — AB (ref 125–200)
HDL: 52 mg/dL (ref >=46)
LDL Cholesterol: 128 mg/dL (ref <130)
TRIGLYCERIDES: 190 mg/dL — AB (ref <150)
Total CHOL/HDL Ratio: 4.2 Ratio (ref <=5.0)
VLDL: 38 mg/dL — ABNORMAL HIGH (ref <30)

## 2016-06-07 MED ORDER — LORAZEPAM 0.5 MG PO TABS: 0.5000 mg | ORAL_TABLET | Freq: Two times a day (BID) | ORAL | 5 refills | Status: DC | PRN

## 2016-06-07 MED ORDER — TIZANIDINE HCL 4 MG PO TABS: ORAL_TABLET | ORAL | 1 refills | Status: DC

## 2016-06-07 MED ORDER — BUSPIRONE HCL 15 MG PO TABS: 30.0000 mg | ORAL_TABLET | Freq: Every day | ORAL | 3 refills | Status: DC

## 2016-06-07 MED ORDER — FLUTICASONE PROPIONATE 50 MCG/ACT NA SUSP: 2.0000 | Freq: Every day | NASAL | 11 refills | Status: DC | PRN

## 2016-06-07 MED ORDER — SUVOREXANT 20 MG PO TABS: 1.0000 | ORAL_TABLET | Freq: Every day | ORAL | 5 refills | Status: DC

## 2016-06-07 MED ORDER — ATENOLOL 50 MG PO TABS: 50.0000 mg | ORAL_TABLET | Freq: Every day | ORAL | 3 refills | Status: DC

## 2016-06-07 MED ORDER — LISINOPRIL-HYDROCHLOROTHIAZIDE 20-12.5 MG PO TABS: 1.0000 | ORAL_TABLET | Freq: Every day | ORAL | 1 refills | Status: DC

## 2016-06-07 MED ORDER — DULOXETINE HCL 60 MG PO CPEP: 120.0000 mg | ORAL_CAPSULE | Freq: Every day | ORAL | 3 refills | Status: DC

## 2016-06-07 MED ORDER — SPIRONOLACTONE 25 MG PO TABS: 25.0000 mg | ORAL_TABLET | Freq: Every day | ORAL | 3 refills | Status: DC

## 2016-06-07 NOTE — Patient Instructions (Addendum)
INCREASE TO 2 CAPSULES OF CYMBALTA/DULOXETINE DAILY. RESTART LASIX ONE TABLET DAILY; PLEASE CALL WITH DOSE OF LASIX.     IF you received an x-ray today, you will receive an invoice from [facility]. Please contact [facility] at [phone] with questions or concerns regarding your invoice.   IF you received labwork today, you will receive an invoice from [employer]. Please contact [facility] at [phone] with questions or concerns regarding your invoice.   Our billing staff will not be able to assist you with questions regarding bills from these companies.  You will be contacted with the lab results as soon as they are available. The fastest way to get your results is to activate your My Chart account. Instructions are located on the last page of this paperwork. If you have not heard from [place] regarding the results in 2 weeks, please contact this office.    Influenza (Flu) Vaccine (Inactivated or Recombinant):  1. Why get vaccinated? Influenza ("flu") is a contagious disease that spreads around the [place] every year, usually between October and May. Flu is caused by influenza viruses, and is spread mainly by coughing, sneezing, and close contact. Anyone can get flu. Flu strikes suddenly and can last several days. Symptoms vary by age, but can include:  fever/chills  sore throat  muscle aches  fatigue  cough  headache  runny or stuffy nose Flu can also lead to pneumonia and blood infections, and cause diarrhea and seizures in children. If you have a medical condition, such as heart or lung disease, flu can make it worse. Flu is more dangerous for some people. Infants and young children, people 63 years of age and older, pregnant women, and people with certain health conditions or a weakened immune system are at greatest risk. Each year thousands of people in the [place] die from flu, and many more are hospitalized. Flu  vaccine can:  keep you from getting flu,  make flu less severe if you do get it, and  keep you from spreading flu to your family and other people. 2. Inactivated and recombinant flu vaccines A dose of flu vaccine is recommended every flu season. Children 6 months through 70 years of age may need two doses during the same flu season. Everyone else needs only one dose each flu season. Some inactivated flu vaccines contain a very small amount of a mercury-based preservative called thimerosal. Studies have not shown thimerosal in vaccines to be harmful, but flu vaccines that do not contain thimerosal are available. There is no live flu virus in flu shots. They cannot cause the flu. There are many flu viruses, and they are always changing. Each year a new flu vaccine is made to protect against three or four viruses that are likely to cause disease in the upcoming flu season. But even when the vaccine doesn't exactly match these viruses, it may still provide some protection. Flu vaccine cannot prevent:  flu that is caused by a virus not covered by the vaccine, or  illnesses that look like flu but are not. It takes about 2 weeks for protection to develop after vaccination, and protection lasts through the flu season. 3. Some people should not get this vaccine Tell the person who is giving you the vaccine:  If you have any severe, life-threatening allergies. If you ever had a life-threatening allergic reaction after a dose of flu vaccine, or have a severe allergy to any part of this vaccine, you may be advised  not to get vaccinated. Most, but not all, types of flu vaccine contain a small amount of egg protein.  If you ever had Guillain-Barre Syndrome (also called GBS). Some people with a history of GBS should not get this vaccine. This should be discussed with your doctor.  If you are not feeling well. It is usually okay to get flu vaccine when you have a mild illness, but you might be asked to come  back when you feel better. 4. Risks of a vaccine reaction With any medicine, including vaccines, there is a chance of reactions. These are usually mild and go away on their own, but serious reactions are also possible. Most people who get a flu shot do not have any problems with it. Minor problems following a flu shot include:  soreness, redness, or swelling where the shot was given  hoarseness  sore, red or itchy eyes  cough  fever  aches  headache  itching  fatigue If these problems occur, they usually begin soon after the shot and last 1 or 2 days. More serious problems following a flu shot can include the following:  There may be a small increased risk of Guillain-Barre Syndrome (GBS) after inactivated flu vaccine. This risk has been estimated at 1 or 2 additional cases per million people vaccinated. This is much lower than the risk of severe complications from flu, which can be prevented by flu vaccine.  Young children who get the flu shot along with pneumococcal vaccine (PCV13) and/or DTaP vaccine at the same time might be slightly more likely to have a seizure caused by fever. Ask your doctor for more information. Tell your doctor if a child who is getting flu vaccine has ever had a seizure. Problems that could happen after any injected vaccine:  People sometimes faint after a medical procedure, including vaccination. Sitting or lying down for about 15 minutes can help prevent fainting, and injuries caused by a fall. Tell your doctor if you feel dizzy, or have vision changes or ringing in the ears.  Some people get severe pain in the shoulder and have difficulty moving the arm where a shot was given. This happens very rarely.  Any medication can cause a severe allergic reaction. Such reactions from a vaccine are very rare, estimated at about 1 in a million doses, and would happen within a few minutes to a few hours after the vaccination. As with any medicine, there is a  very remote chance of a vaccine causing a serious injury or death. The safety of vaccines is always being monitored. For more information, visit: http://www.aguilar.org/ 5. What if there is a serious reaction? What should I look for?  Look for anything that concerns you, such as signs of a severe allergic reaction, very high fever, or unusual behavior. Signs of a severe allergic reaction can include hives, swelling of the face and throat, difficulty breathing, a fast heartbeat, dizziness, and weakness. These would start a few minutes to a few hours after the vaccination. What should I do?  If you think it is a severe allergic reaction or other emergency that can't wait, call 9-1-1 and get the person to the nearest hospital. Otherwise, call your doctor.  Reactions should be reported to the Vaccine Adverse Event Reporting System (VAERS). Your doctor should file this report, or you can do it yourself through the VAERS web site at www.vaers.SamedayNews.es, or by calling (857)215-9324. VAERS does not give medical advice. 6. The National Vaccine Injury Fiserv The  National Sport and exercise psychologist (VICP) is a Technical brewer that was created to compensate people who may have been injured by certain vaccines. Persons who believe they may have been injured by a vaccine can learn about the program and about filing a claim by calling 7851030528 or visiting the [place] website at GoldCloset.com.ee. There is a time limit to file a claim for compensation. 7. How can I learn more?  Ask your healthcare provider. He or she can give you the vaccine package insert or suggest other sources of information.  Call your local or state health department.  Contact the Centers for Disease Control and Prevention (CDC):  Call 2766165479 (1-800-CDC-INFO) or  Visit CDC's website at https://gibson.com/ Vaccine Information Statement Inactivated Influenza Vaccine (03/21/2014)   This  information is not intended to replace advice given to you by your health care provider. Make sure you discuss any questions you have with your health care provider.   Document Released: 05/26/2006 Document Revised: 08/22/2014 Document Reviewed: 03/24/2014 Elsevier Interactive Patient Education [employer].

## 2016-06-07 NOTE — Telephone Encounter (Signed)
Patient is calling to let Dr. Tamala Julian know that she is taking furosimide 20MG  tablets. Patient states that Dr. Tamala Julian requested that the patient call and give [place] this info.   [phone]

## 2016-06-07 NOTE — Progress Notes (Signed)
Subjective:    Patient ID: Selena Bush, female    DOB: 22-Jun-1946, 70 y.o.   MRN: [id number]  06/07/2016  Follow-up (Insomnia" about the same" , depression scale during triage, score 14)   HPI This 70 y.o. female presents for four month follow-up of hypertension, anxiety/depression, diastolic heart failure, GERD, DDD lumbar and cervical spines, insomnia, OSA.  Pharmacist called about concern with three diuretics. Stopped Lasix around 10/5//17; blood pressure had been elevated since then. Cannot tolerate Amlodipine; causes headaches.  Retired two years ago; had an Therapist, sports on treatment team. S/p echo prior to surgery ;normal EF; has been referred to cardiology x 2 in the past with pre-operatively.  Goal is 120/70.    Insomnia: saw Dr. Everlene Balls in 03/2016.    Generalized anxiety disorder/depression:  Increased Cymbalta to 90mg  daily. Did not increase to 90mg ; only taking 60mg  daily.  Willing to take two tablets of Cymbalta.  Continues to suffer with daily sadness; misses husband terribly.   Immunization History  Administered Date(s) Administered  . Influenza, High Dose Seasonal PF 05/05/2015  . Influenza,inj,Quad PF,36+ Mos 06/07/2016  . Pneumococcal Conjugate-13 02/06/2016  . Pneumococcal Polysaccharide-23 09/22/2014   Wt Readings from Last 3 Encounters:  06/07/16 247 lb 3.2 oz (112.1 kg)  03/21/16 250 lb (113.4 kg)  02/06/16 252 lb 3.2 oz (114.4 kg)   BP Readings from Last 3 Encounters:  06/07/16 (!) 140/92  03/21/16 128/86  02/06/16 124/70    Review of Systems  Constitutional: Negative for chills, diaphoresis, fatigue and fever.  Eyes: Negative for visual disturbance.  Respiratory: Negative for cough and shortness of breath.   Cardiovascular: Positive for leg swelling. Negative for chest pain and palpitations.  Gastrointestinal: Negative for abdominal pain, constipation, diarrhea, nausea and vomiting.  Endocrine: Negative for cold intolerance, heat intolerance, polydipsia,  polyphagia and polyuria.  Neurological: Negative for dizziness, tremors, seizures, syncope, facial asymmetry, speech difficulty, weakness, light-headedness, numbness and headaches.  Psychiatric/Behavioral: Positive for dysphoric mood and sleep disturbance. Negative for self-injury and suicidal ideas. The patient is not nervous/anxious.     Past Medical History:  Diagnosis Date  . Anxiety   . Chronic diastolic CHF (congestive heart failure) ()    a. echo 09/2014: EF [id number], diastolic dysfunction, mild LVH, nl RV size & systolic function, mildly dilated LA (4.3 cm), mild MR/TR, mildly elevated PASP 36.7 mm Hg  . DDD (degenerative disc disease), cervical   . DDD (degenerative disc disease), lumbar   . Depression   . Diffuse cystic mastopathy 2014  . GERD (gastroesophageal reflux disease)   . Headache    rare  . HLD (hyperlipidemia)    a. statin intolerant 2/2 myalgias  . Hypercholesterolemia   . Hypertension   . 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 DR/PR positive. PARTIAL RIGHT MASTECTOMY FOR BREAST CANCER--HAD RADIATION - NO CHEMO --DR. [place] ONCOLOGIST  . Obesity   . OSA on CPAP   . Osteoarthritis of both knees    a. s/p right TKA 04/2013 & left TKA 09/2014  . Otitis externa   . Sleep difficulties    LUNESTA HAS HELPED  . Vaginal cyst    Past Surgical History:  Procedure Laterality Date  . ABDOMINAL HYSTERECTOMY  1992   DUB; fibroids; endometriosis.  One remaining ovary.    [name] BREAST SURGERY Right March 2014   Wide excision,APB RT 10 mm papillary DCIS, ER/PR positive. Sentinel node negative. Partial breast radiation.  [name]  CHOLECYSTECTOMY  1994  . COLONOSCOPY  2015    1 benign polyp-every 5 years  . ERCP  1995  . JOINT REPLACEMENT Right Sept 2014   knee  . TOTAL KNEE ARTHROPLASTY Right 04/16/2013   Procedure: RIGHT TOTAL KNEE ARTHROPLASTY;  Surgeon: Mauri Pole, MD;  Location: [facility];   Service: Orthopedics;  Laterality: Right;  . TOTAL KNEE ARTHROPLASTY Left 09/15/2014   Procedure: LEFT TOTAL KNEE ARTHROPLASTY;  Surgeon: Mauri Pole, MD;  Location: [facility];  Service: Orthopedics;  Laterality: Left;  . TUBAL LIGATION  1979   Allergies  Allergen Reactions  . Asa [Aspirin]   . Codeine     Gi problems   . Penicillins     anaphylaxis    . Statins Other (See Comments)    Leg pains  . Sulfa Antibiotics     anaphylaxis     Social History   Social History  . Marital status: Widowed    Spouse name: Chrissie Noa  . Number of children: 2  . Years of education: College   Occupational History  . Retired    Social History Main Topics  . Smoking status: Never Smoker  . Smokeless tobacco: Never Used     Comment: social smoker as a teen  . Alcohol use No  . Drug use: No  . Sexual activity: Not on file   Other Topics Concern  . Not on file   Social History Narrative   Marital status: widowed      Children: 2 children; 1 grandchild      Lives: alone in townhome      Employment: psychiatric Education officer, museum; retired in 2015      Tobacco: teenager only      Alcohol:  None      Drugs: none      Exercise: none in 2017.   Family History  Problem Relation Age of Onset  . Colon cancer Mother   . Melanoma Father   . Colon cancer Maternal Grandfather   . Breast cancer Maternal Grandfather        Objective:    BP (!) 140/92 (BP Location: Left Arm, Cuff Size: Large)   Pulse 62   Temp 98.3 F (36.8 C) (Oral)   Resp 20   Ht 5\' 2"  (1.575 m)   Wt 247 lb 3.2 oz (112.1 kg)   SpO2 96%   BMI 45.21 kg/m  Physical Exam  Constitutional: She is oriented to person, place, and time. She appears well-developed and well-nourished. No distress.  HENT:  Head: Normocephalic and atraumatic.  Right Ear: External ear normal.  Left Ear: External ear normal.  Nose: Nose normal.  Mouth/Throat: Oropharynx is clear and moist.  Eyes: Conjunctivae and EOM are normal. Pupils are equal,  round, and reactive to light.  Neck: Normal range of motion. Neck supple. Carotid bruit is not present. No thyromegaly present.  Cardiovascular: Normal rate, regular rhythm, normal heart sounds and intact distal pulses.  Exam reveals no gallop and no friction rub.   No murmur heard. Pulmonary/Chest: Effort normal and breath sounds normal. She has no wheezes. She has no rales.  Abdominal: Soft. Bowel sounds are normal. She exhibits no distension and no mass. There is no tenderness. There is no rebound and no guarding.  Lymphadenopathy:    She has no cervical adenopathy.  Neurological: She is alert and oriented to person, place, and time. No cranial nerve deficit.  Skin: Skin is warm and dry. No rash noted. She  is not diaphoretic. No erythema. No pallor.  Psychiatric: She has a normal mood and affect. Her behavior is normal.   Results for orders placed or performed in visit on 10/30/15  [facility] COLONOSCOPY  Result Value Ref Range   [facility] Colonoscopy WNL        Assessment & Plan:   1. Essential hypertension, benign   2. Chronic diastolic CHF (congestive heart failure) ([place])   3. Obstructive sleep apnea syndrome   4. Gastroesophageal reflux disease without esophagitis   5. Primary osteoarthritis of both knees   6. Pure hypercholesterolemia   7. Reactive depression   8. Hyperglycemia   9. Psychophysiological insomnia   10. Anxiety   11. Flu vaccine need   12. Need for hepatitis C screening test   13. Benign essential HTN   14. Muscle spasm    -uncontrolled depression; increase Cymbalta to 120mg  daily. Restart Lasix therapy. -obtain labs.   Orders Placed This Encounter  Procedures  . Flu Vaccine QUAD 36+ mos IM  . CBC with Differential/Platelet  . Comprehensive metabolic panel    Order Specific Question:   Has the patient fasted?    Answer:   Yes  . Lipid panel    Order Specific Question:   Has the patient fasted?    Answer:   Yes  . Hemoglobin A1c  . Hepatitis C antibody    Meds ordered this encounter  Medications  . atenolol (TENORMIN) 50 MG tablet    Sig: Take 1 tablet (50 mg total) by mouth daily.    Dispense:  90 tablet    Refill:  3  . busPIRone (BUSPAR) 15 MG tablet    Sig: Take 2 tablets (30 mg total) by mouth at bedtime.    Dispense:  180 tablet    Refill:  3  . DULoxetine (CYMBALTA) 60 MG capsule    Sig: Take 2 capsules (120 mg total) by mouth daily.    Dispense:  180 capsule    Refill:  3  . fluticasone (FLONASE) 50 MCG/ACT nasal spray    Sig: Place 2 sprays into both nostrils daily as needed for rhinitis or allergies.    Dispense:  16 g    Refill:  11  . lisinopril-hydrochlorothiazide (PRINZIDE,ZESTORETIC) 20-12.5 MG tablet    Sig: Take 1 tablet by mouth daily.    Dispense:  90 tablet    Refill:  1    **Patient requests 90 days supply**  . LORazepam (ATIVAN) 0.5 MG tablet    Sig: Take 1 tablet (0.5 mg total) by mouth 2 (two) times daily as needed.    Dispense:  60 tablet    Refill:  5  . spironolactone (ALDACTONE) 25 MG tablet    Sig: Take 1 tablet (25 mg total) by mouth daily.    Dispense:  90 tablet    Refill:  3  . Suvorexant (BELSOMRA) 20 MG TABS    Sig: Take 1 tablet by mouth at bedtime.    Dispense:  30 tablet    Refill:  5  . tiZANidine (ZANAFLEX) 4 MG tablet    Sig: TAKE 1 TABLET BY MOUTH AT SUPPERTIME AND 1 TABLET AT BEDTIME    Dispense:  180 tablet    Refill:  1    Return in about 3 months (around 09/07/2016) for recheck BLOOD PRESSURE, ANXIETY.   Kristi Elayne Guerin, M.D. [facility] [address] [place], [place]  [address] [phone] phone [phone] fax

## 2016-06-08 LAB — HEMOGLOBIN A1C
Hgb A1c MFr Bld: 5.6 % (ref <5.7)
Mean Plasma Glucose: 114 mg/dL

## 2016-06-08 LAB — HEPATITIS C ANTIBODY: HCV AB: NEGATIVE

## 2016-06-08 NOTE — Telephone Encounter (Signed)
Updated med list and sending to Dr Tamala Julian.

## 2016-06-10 ENCOUNTER — Encounter: Payer: Self-pay | Admitting: *Deleted

## 2016-06-17 ENCOUNTER — Telehealth: Payer: Self-pay

## 2016-06-17 NOTE — Telephone Encounter (Signed)
Pt dropped off [employer] Pt asst form for [employer]. Completed form and will put in Dr Thompson Caul box for signature.

## 2016-06-27 NOTE — Telephone Encounter (Signed)
Form completion; will return to RN desk on 06/28/16.

## 2016-06-28 NOTE — Telephone Encounter (Signed)
LMOM advising form is completed and I am mailing it out today. Copied form for scanning. Placed in regular mail box since in pt's own addressed/stamped envelope.

## 2016-06-28 NOTE — Telephone Encounter (Signed)
Pt called back and I advised her that Selena Bush was putting paper work in the mail that Dr Tamala Julian had finished for her

## 2016-07-01 ENCOUNTER — Ambulatory Visit (INDEPENDENT_AMBULATORY_CARE_PROVIDER_SITE_OTHER): Payer: Medicare Other

## 2016-07-01 ENCOUNTER — Ambulatory Visit (INDEPENDENT_AMBULATORY_CARE_PROVIDER_SITE_OTHER): Payer: Medicare Other | Admitting: Family Medicine

## 2016-07-01 DIAGNOSIS — F329 Major depressive disorder, single episode, unspecified: Secondary | ICD-10-CM

## 2016-07-01 DIAGNOSIS — I10 Essential (primary) hypertension: Secondary | ICD-10-CM | POA: Diagnosis not present

## 2016-07-01 DIAGNOSIS — M79675 Pain in left toe(s): Secondary | ICD-10-CM

## 2016-07-01 LAB — POCT CBC
GRANULOCYTE PERCENT: 72.2 % (ref 37–80)
HEMATOCRIT: 34.4 % — AB (ref 37.7–47.9)
Hemoglobin: 11.8 g/dL — AB (ref 12.2–16.2)
Lymph, poc: 1.6 (ref 0.6–3.4)
MCH: 28.5 pg (ref 27–31.2)
MCHC: 34.4 g/dL (ref 31.8–35.4)
MCV: 82.8 fL (ref 80–97)
MID (CBC): 0.4 (ref 0–0.9)
MPV: 6.4 fL (ref 0–99.8)
POC GRANULOCYTE: 5.1 (ref 2–6.9)
POC LYMPH %: 22.4 % (ref 10–50)
POC MID %: 5.4 % (ref 0–12)
Platelet Count, POC: 255 10*3/uL (ref 142–424)
RBC: 4.16 M/uL (ref 4.04–5.48)
RDW, POC: 13.4 %
WBC: 7 10*3/uL (ref 4.6–10.2)

## 2016-07-01 MED ORDER — COLCHICINE 0.6 MG PO TABS: 0.6000 mg | ORAL_TABLET | Freq: Two times a day (BID) | ORAL | 0 refills | Status: DC

## 2016-07-01 NOTE — Progress Notes (Signed)
Subjective:    Patient ID: Selena Bush, female    DOB: [birth year], 70 y.o.   MRN: [id number]  07/01/2016  Toe Injury (left great toe, x 1 week pt says burning and pain ); other (depression scale # 7); and discuss (pt wants to discuss parathyroid tumor )   HPI This 70 y.o. female presents for evaluation of L first toe pain. Onset with pain first at night; then starting burning really badly.  Awoke three times last night due to intense burning.  Red a little bit and concerned about gout or pseudogout.  Swollen at night.  Sister with gout.  Can take Advil gel bid two.    Sister with parathyroid tumor.  S/p resection.  Hereditary component.    BP Readings from Last 3 Encounters:  06/07/16 (!) 140/92  03/21/16 128/86  02/06/16 124/70     Feeling better emotionally with increase in Cymbalta; less down and sad; not crying as often.   Review of Systems  Constitutional: Negative for chills, diaphoresis, fatigue and fever.  HENT: Negative for ear pain, postnasal drip, rhinorrhea, sinus pressure, sore throat and trouble swallowing.   Respiratory: Negative for cough and shortness of breath.   Cardiovascular: Negative for chest pain, palpitations and leg swelling.  Gastrointestinal: Negative for abdominal pain, constipation, diarrhea, nausea and vomiting.  Musculoskeletal: Positive for arthralgias and gait problem.  Psychiatric/Behavioral: Positive for dysphoric mood and sleep disturbance. Negative for self-injury and suicidal ideas.    Past Medical History:  Diagnosis Date  . Anxiety   . Chronic diastolic CHF (congestive heart failure) ([place])    a. echo 09/2014: EF 123456, diastolic dysfunction, mild LVH, nl RV size & systolic function, mildly dilated LA (4.3 cm), mild MR/TR, mildly elevated PASP 36.7 mm Hg  . DDD (degenerative disc disease), cervical   . DDD (degenerative disc disease), lumbar   . Depression   . Diffuse cystic mastopathy 2014  . GERD (gastroesophageal reflux  disease)   . Headache    rare  . HLD (hyperlipidemia)    a. statin intolerant 2/2 myalgias  . Hypercholesterolemia   . Hypertension   . Malignant neoplasm of upper-outer quadrant of female breast ([place]) 10/2012   Papillary DCIS, sentinel node negative. DR/PR positive. PARTIAL RIGHT MASTECTOMY FOR BREAST CANCER--HAD RADIATION - NO CHEMO --DR. [place] ONCOLOGIST  . Obesity   . OSA on CPAP   . Osteoarthritis of both knees    a. s/p right TKA 04/2013 & left TKA 09/2014  . Otitis externa   . Sleep difficulties    LUNESTA HAS HELPED  . Vaginal cyst    Past Surgical History:  Procedure Laterality Date  . ABDOMINAL HYSTERECTOMY  1992   DUB; fibroids; endometriosis.  One remaining ovary.    [name] BREAST SURGERY Right March 2014   Wide excision,APB RT 10 mm papillary DCIS, ER/PR positive. Sentinel node negative. Partial breast radiation.  . CHOLECYSTECTOMY  1994  . COLONOSCOPY  2015    1 benign polyp-every 5 years  . ERCP  1995  . JOINT REPLACEMENT Right Sept 2014   knee  . TOTAL KNEE ARTHROPLASTY Right 04/16/2013   Procedure: RIGHT TOTAL KNEE ARTHROPLASTY;  Surgeon: Mauri Pole, MD;  Location: [facility];  Service: Orthopedics;  Laterality: Right;  . TOTAL KNEE ARTHROPLASTY Left 09/15/2014   Procedure: LEFT TOTAL KNEE ARTHROPLASTY;  Surgeon: Mauri Pole, MD;  Location: [facility];  Service: Orthopedics;  Laterality: Left;  . TUBAL LIGATION  1979   Allergies  Allergen Reactions  . Asa [Aspirin]   . Codeine     Gi problems   . Penicillins     anaphylaxis    . Statins Other (See Comments)    Leg pains  . Sulfa Antibiotics     anaphylaxis     Social History   Social History  . Marital status: Widowed    Spouse name: Chrissie Noa  . Number of children: 2  . Years of education: College   Occupational History  . Retired    Social History Main Topics  . Smoking status: Never Smoker  . Smokeless tobacco: Never Used     Comment: social smoker as a teen  . Alcohol use No  .  Drug use: No  . Sexual activity: Not on file   Other Topics Concern  . Not on file   Social History Narrative   Marital status: widowed      Children: 2 children; 1 grandchild      Lives: alone in townhome      Employment: psychiatric Education officer, museum; retired in 2015      Tobacco: teenager only      Alcohol:  None      Drugs: none      Exercise: none in 2017.   Family History  Problem Relation Age of Onset  . Colon cancer Mother   . Melanoma Father   . Colon cancer Maternal Grandfather   . Breast cancer Maternal Grandfather        Objective:    There were no vitals taken for this visit. Physical Exam  Constitutional: She is oriented to person, place, and time. She appears well-developed and well-nourished. No distress.  HENT:  Head: Normocephalic and atraumatic.  Eyes: Conjunctivae are normal. Pupils are equal, round, and reactive to light.  Neck: Normal range of motion. Neck supple.  Cardiovascular: Normal rate, regular rhythm and normal heart sounds.  Exam reveals no gallop and no friction rub.   No murmur heard. Pulmonary/Chest: Effort normal and breath sounds normal. She has no wheezes. She has no rales.  Musculoskeletal:  L FIRST TOE: swelling diffusely; +TTP distal aspect of toe with erythema diffuse of toe yet no warmth; no MTP swelling or tenderness; full extension and flexion of toe yet painful.  No streaking.  No fluctuance of skin.  No FB appreciated.  Neurological: She is alert and oriented to person, place, and time.  Skin: Skin is warm and dry. No rash noted. She is not diaphoretic. No erythema. No pallor.  Psychiatric: She has a normal mood and affect. Her behavior is normal.  Nursing note and vitals reviewed.  Results for orders placed or performed in visit on 07/01/16  Uric Acid  Result Value Ref Range   Uric Acid, Serum 6.6 2.5 - 7.0 mg/dL  POCT CBC  Result Value Ref Range   WBC 7.0 4.6 - 10.2 K/uL   Lymph, poc 1.6 0.6 - 3.4   POC LYMPH PERCENT 22.4  10 - 50 %L   MID (cbc) 0.4 0 - 0.9   POC MID % 5.4 0 - 12 %M   POC Granulocyte 5.1 2 - 6.9   Granulocyte percent 72.2 37 - 80 %G   RBC 4.16 4.04 - 5.48 M/uL   Hemoglobin 11.8 (A) 12.2 - 16.2 g/dL   HCT, POC 34.4 (A) 37.7 - 47.9 %   MCV 82.8 80 - 97 fL   MCH, POC 28.5 27 - 31.2 pg   MCHC 34.4 31.8 -  35.4 g/dL   RDW, POC 13.4 %   Platelet Count, POC 255 142 - 424 K/uL   MPV 6.4 0 - 99.8 fL   Dg Toe Great Left  Result Date: 07/01/2016 CLINICAL DATA:  Distal left great toe pain and redness. No reported injury. EXAM: LEFT GREAT TOE COMPARISON:  None. FINDINGS: Normal appearing great toe. Moderate-sized inferior calcaneal fragment spur formation. IMPRESSION: Inferior calcaneal spur formation. Otherwise, unremarkable examination. Electronically Signed   By: Claudie Revering M.D.   On: 07/01/2016 16:25        Assessment & Plan:   1. Pain of toe of left foot   2. Essential hypertension, benign   3. Reactive depression    -New onset L first toe pain; presentation unusual; treat with Colchicine bid until improved; recommend icing and supportive shoe.  RTC for acute worsening.  Obtain uric acid level yet atypical for gout. Also not consistent with cellulitis or ingrown toenail.   -blood pressure readings have improved from last visit. -depression has also improved since last visit. -sister with hyperparathyroidism; reviewed pt's recent labs with a normal calcium level; reassurance provided.   Orders Placed This Encounter  Procedures  . DG Toe Great Left    Standing Status:   Future    Number of Occurrences:   1    Standing Expiration Date:   07/01/2017    Order Specific Question:   Reason for Exam (SYMPTOM  OR DIAGNOSIS REQUIRED)    Answer:   distal toe pain with redness    Order Specific Question:   Preferred imaging location?    Answer:   External  . Uric Acid  . POCT CBC   Meds ordered this encounter  Medications  . colchicine 0.6 MG tablet    Sig: Take 1 tablet (0.6 mg total)  by mouth 2 (two) times daily.    Dispense:  30 tablet    Refill:  0    No Follow-up on file.   Rocco Kerkhoff Elayne Guerin, M.D. [facility] [address] [place], [place]  [address] [phone] phone [phone] fax

## 2016-07-01 NOTE — Patient Instructions (Signed)

## 2016-07-02 LAB — URIC ACID: Uric Acid, Serum: 6.6 mg/dL (ref 2.5–7.0)

## 2016-07-12 ENCOUNTER — Encounter: Payer: Self-pay | Admitting: Family Medicine

## 2016-07-27 ENCOUNTER — Other Ambulatory Visit: Payer: Self-pay | Admitting: Family Medicine

## 2016-07-27 DIAGNOSIS — M62838 Other muscle spasm: Secondary | ICD-10-CM

## 2016-07-27 NOTE — Telephone Encounter (Signed)
Please review, looks like she is seen you:)-aa

## 2016-07-28 ENCOUNTER — Ambulatory Visit: Payer: Medicare Other | Admitting: Radiation Oncology

## 2016-07-29 NOTE — Telephone Encounter (Signed)
Patient is calling to follow up on her refill request. She states she's completely out

## 2016-08-02 ENCOUNTER — Telehealth: Payer: Self-pay | Admitting: *Deleted

## 2016-08-02 NOTE — Telephone Encounter (Signed)
Her medication Evista is here at office from the [facility].

## 2016-08-02 NOTE — Telephone Encounter (Signed)
Patient called back and is aware that her medication Evista is here at the office

## 2016-08-02 NOTE — Telephone Encounter (Signed)
Pt came by to pick up meds.

## 2016-08-10 ENCOUNTER — Ambulatory Visit
Admission: RE | Admit: 2016-08-10 | Discharge: 2016-08-10 | Disposition: A | Discharge status: Home / Self Care | Payer: Medicare Other | Source: Ambulatory Visit | Attending: Radiation Oncology | Admitting: Radiation Oncology

## 2016-08-10 ENCOUNTER — Encounter: Payer: Self-pay | Admitting: Radiation Oncology

## 2016-08-10 VITALS — BP 147/85 | HR 86 | Temp 98.9°F | Resp 22 | Wt 242.3 lb

## 2016-08-10 DIAGNOSIS — D0511 Intraductal carcinoma in situ of right breast: Secondary | ICD-10-CM | POA: Insufficient documentation

## 2016-08-10 DIAGNOSIS — Z170 Estrogen receptor positive status [ER+]: Secondary | ICD-10-CM | POA: Insufficient documentation

## 2016-08-10 DIAGNOSIS — Z79810 Long term (current) use of selective estrogen receptor modulators (SERMs): Secondary | ICD-10-CM | POA: Diagnosis not present

## 2016-08-10 DIAGNOSIS — Z923 Personal history of irradiation: Secondary | ICD-10-CM | POA: Insufficient documentation

## 2016-08-10 NOTE — Progress Notes (Signed)
Radiation Oncology Follow up Note  Name: Selena Bush   Date:   08/10/2016 MRN:  [id number] DOB: 1946-06-30    This 70 y.o. 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 She seen today in routine follow-up is doing well. Her follow-up mammograms. Have been fine according to the patient. She has been switched from tamoxifen to assist and tolerating that well without side effect. She specifically denies breast tenderness cough or bone pain.  COMPLICATIONS OF TREATMENT: none  FOLLOW UP COMPLIANCE: keeps appointments   PHYSICAL EXAM:  BP (!) 147/85   Pulse 86   Temp 98.9 F (37.2 C)   Resp (!) 22   Wt 242 lb 4.6 oz (109.9 kg)   BMI 44.31 kg/m  Lungs are clear to A&P cardiac examination essentially unremarkable with regular rate and rhythm. No dominant mass or nodularity is noted in either breast in 2 positions examined. Incision is well-healed. No axillary or supraclavicular adenopathy is appreciated. Cosmetic result is excellent. Well-developed well-nourished patient in NAD. HEENT reveals PERLA, EOMI, discs not visualized.  Oral cavity is clear. No oral mucosal lesions are identified. Neck is clear without evidence of cervical or supraclavicular adenopathy. Lungs are clear to A&P. Cardiac examination is essentially unremarkable with regular rate and rhythm without murmur rub or thrill. Abdomen is benign with no organomegaly or masses noted. Motor sensory and DTR levels are equal and symmetric in the upper and lower extremities. Cranial nerves II through XII are grossly intact. Proprioception is intact. No peripheral adenopathy or edema is identified. No motor or  sensory levels are noted. Crude visual fields are within normal range.  RADIOLOGY RESULTS: Previous mammograms have been requested for my review  PLAN: Present time patient is doing well with no evidence of disease 3 years out. Transportation is difficult for patient I'm turning follow-up care over to her surgeon. I would be happy to reevaluate the patient any time should further treatment be indicated.  I would like to take this opportunity to thank you for allowing me to participate in the care of your patient.Armstead Peaks., MD

## 2016-09-07 ENCOUNTER — Ambulatory Visit: Payer: Medicare Other | Admitting: Family Medicine

## 2016-09-13 ENCOUNTER — Encounter: Payer: Self-pay | Admitting: Family Medicine

## 2016-09-13 ENCOUNTER — Ambulatory Visit (INDEPENDENT_AMBULATORY_CARE_PROVIDER_SITE_OTHER): Payer: PPO | Admitting: Family Medicine

## 2016-09-13 VITALS — BP 138/82 | HR 76 | Temp 98.2°F | Resp 18 | Ht 62.0 in | Wt 241.0 lb

## 2016-09-13 DIAGNOSIS — Z96652 Presence of left artificial knee joint: Secondary | ICD-10-CM

## 2016-09-13 DIAGNOSIS — I10 Essential (primary) hypertension: Secondary | ICD-10-CM | POA: Diagnosis not present

## 2016-09-13 DIAGNOSIS — F5104 Psychophysiologic insomnia: Secondary | ICD-10-CM | POA: Diagnosis not present

## 2016-09-13 DIAGNOSIS — K219 Gastro-esophageal reflux disease without esophagitis: Secondary | ICD-10-CM | POA: Diagnosis not present

## 2016-09-13 DIAGNOSIS — Z23 Encounter for immunization: Secondary | ICD-10-CM

## 2016-09-13 DIAGNOSIS — E7800 Pure hypercholesterolemia, unspecified: Secondary | ICD-10-CM

## 2016-09-13 DIAGNOSIS — D0511 Intraductal carcinoma in situ of right breast: Secondary | ICD-10-CM | POA: Diagnosis not present

## 2016-09-13 DIAGNOSIS — Z0000 Encounter for general adult medical examination without abnormal findings: Secondary | ICD-10-CM

## 2016-09-13 DIAGNOSIS — F329 Major depressive disorder, single episode, unspecified: Secondary | ICD-10-CM

## 2016-09-13 DIAGNOSIS — R739 Hyperglycemia, unspecified: Secondary | ICD-10-CM

## 2016-09-13 DIAGNOSIS — L244 Irritant contact dermatitis due to drugs in contact with skin: Secondary | ICD-10-CM

## 2016-09-13 LAB — POCT URINALYSIS DIP (MANUAL ENTRY)
BILIRUBIN UA: NEGATIVE
Blood, UA: NEGATIVE
GLUCOSE UA: NEGATIVE
Ketones, POC UA: NEGATIVE
LEUKOCYTES UA: NEGATIVE
NITRITE UA: NEGATIVE
Protein Ur, POC: NEGATIVE
Spec Grav, UA: 1.015
Urobilinogen, UA: 0.2
pH, UA: 5.5

## 2016-09-13 MED ORDER — ZOSTER VACCINE LIVE 19400 UNT/0.65ML ~~LOC~~ SUSR: 0.6500 mL | Freq: Once | SUBCUTANEOUS | 0 refills | Status: AC

## 2016-09-13 MED ORDER — CLOBETASOL PROPIONATE 0.05 % EX OINT: 1.0000 "application " | TOPICAL_OINTMENT | Freq: Two times a day (BID) | CUTANEOUS | 0 refills | Status: DC

## 2016-09-13 NOTE — Progress Notes (Signed)
Subjective:    Patient ID: Selena Bush, female    DOB: Apr 23, 1946, 71 y.o.   MRN: [id number]  09/13/2016  Annual Exam   HPI This 71 y.o. female presents for Annual Wellness Examination and follow-up of chronic medical conditions.  Last physical:  05-05-2015 Pap smear:  hysterectomy Mammogram:  11-10-2015 Colonoscopy: 2015 Bone density:  REFUSES Eye exam:  Cataract surgery this summer. Dental exam:  Every six months.  NOT TETANUS OR ZOSTAVAX; HAS RX.  Immunization History  Administered Date(s) Administered  . Influenza, High Dose Seasonal PF 05/05/2015  . Influenza,inj,Quad PF,36+ Mos 06/07/2016  . Pneumococcal Conjugate-13 02/06/2016  . Pneumococcal Polysaccharide-23 09/22/2014   BP Readings from Last 3 Encounters:  09/28/16 138/72  09/13/16 138/82  08/10/16 (!) 147/85   Wt Readings from Last 3 Encounters:  09/28/16 239 lb (108.4 kg)  09/13/16 241 lb (109.3 kg)  08/10/16 242 lb 4.6 oz (109.9 kg)    Depression: doing better; coming up on one year anniversary of husband's death on October 05, 2015.  Takes Lorazepam 2 qhs; gets really scared; afraid to live alone.  Doing really well.    Insomnia: sleeping really well.  Belsomra helps with sleep really well.  Has been taking Lorazepam qhs for atleast one year.   DDD lumbar: takes Zanaflex qhs.  If does not help, has horrible muscle spasms at night.   Review of Systems  Constitutional: Negative for activity change, appetite change, chills, diaphoresis, fatigue, fever and unexpected weight change.  HENT: Negative for congestion, dental problem, drooling, ear discharge, ear pain, facial swelling, hearing loss, mouth sores, nosebleeds, postnasal drip, rhinorrhea, sinus pressure, sneezing, sore throat, tinnitus, trouble swallowing and voice change.   Eyes: Negative for photophobia, pain, discharge, redness, itching and visual disturbance.  Respiratory: Negative for apnea, cough, choking, chest tightness, shortness of breath,  wheezing and stridor.   Cardiovascular: Negative for chest pain, palpitations and leg swelling.  Gastrointestinal: Negative for abdominal distention, abdominal pain, anal bleeding, blood in stool, constipation, diarrhea, nausea, rectal pain and vomiting.  Endocrine: Negative for cold intolerance, heat intolerance, polydipsia, polyphagia and polyuria.  Genitourinary: Negative for decreased urine volume, difficulty urinating, dyspareunia, dysuria, enuresis, flank pain, frequency, genital sores, hematuria, menstrual problem, pelvic pain, urgency, vaginal bleeding, vaginal discharge and vaginal pain.  Musculoskeletal: Positive for back pain. Negative for arthralgias, gait problem, joint swelling, myalgias, neck pain and neck stiffness.  Skin: Negative for color change, pallor, rash and wound.  Allergic/Immunologic: Negative for environmental allergies, food allergies and immunocompromised state.  Neurological: Negative for dizziness, tremors, seizures, syncope, facial asymmetry, speech difficulty, weakness, light-headedness, numbness and headaches.  Hematological: Negative for adenopathy. Does not bruise/bleed easily.  Psychiatric/Behavioral: Positive for dysphoric mood and sleep disturbance. Negative for agitation, behavioral problems, confusion, decreased concentration, hallucinations, self-injury and suicidal ideas. The patient is not nervous/anxious and is not hyperactive.     Past Medical History:  Diagnosis Date  . Anxiety   . Chronic diastolic CHF (congestive heart failure) ([place])    a. echo 09/2014: EF 123456, diastolic dysfunction, mild LVH, nl RV size & systolic function, mildly dilated LA (4.3 cm), mild MR/TR, mildly elevated PASP 36.7 mm Hg  . DDD (degenerative disc disease), cervical   . DDD (degenerative disc disease), lumbar   . Depression   . Diffuse cystic mastopathy 2014  . GERD (gastroesophageal reflux disease)   . Headache    rare  . HLD (hyperlipidemia)    a. statin  intolerant 2/2 myalgias  . Hypercholesterolemia   .  Hypertension   . Malignant neoplasm of upper-outer quadrant of female breast ([place]) 10/2012   Papillary DCIS, sentinel node negative. DR/PR positive. PARTIAL RIGHT MASTECTOMY FOR BREAST CANCER--HAD RADIATION - NO CHEMO --DR. [place] ONCOLOGIST  . Obesity   . OSA on CPAP   . Osteoarthritis of both knees    a. s/p right TKA 04/2013 & left TKA 09/2014  . Otitis externa   . Sleep difficulties    LUNESTA HAS HELPED  . Vaginal cyst    Past Surgical History:  Procedure Laterality Date  . ABDOMINAL HYSTERECTOMY  1992   DUB; fibroids; endometriosis.  One remaining ovary.    [name] BREAST SURGERY Right March 2014   Wide excision,APB RT 10 mm papillary DCIS, ER/PR positive. Sentinel node negative. Partial breast radiation.  [name] CATARACT EXTRACTION, BILATERAL  02/13/2016   [place].  . CHOLECYSTECTOMY  1994  . COLONOSCOPY  2015    1 benign polyp-every 5 years  . ERCP  1995  . JOINT REPLACEMENT Right Sept 2014   knee  . TOTAL KNEE ARTHROPLASTY Right 04/16/2013   Procedure: RIGHT TOTAL KNEE ARTHROPLASTY;  Surgeon: Mauri Pole, MD;  Location: [facility];  Service: Orthopedics;  Laterality: Right;  . TOTAL KNEE ARTHROPLASTY Left 09/15/2014   Procedure: LEFT TOTAL KNEE ARTHROPLASTY;  Surgeon: Mauri Pole, MD;  Location: [facility];  Service: Orthopedics;  Laterality: Left;  . TUBAL LIGATION  1979   Allergies  Allergen Reactions  . Asa [Aspirin]   . Codeine     Gi problems   . Penicillins     anaphylaxis    . Statins Other (See Comments)    Leg pains  . Sulfa Antibiotics     anaphylaxis     Social History   Social History  . Marital status: Widowed    Spouse name: Chrissie Noa  . Number of children: 2  . Years of education: College   Occupational History  . Retired    Social History Main Topics  . Smoking status: Never Smoker  . Smokeless tobacco: Never Used     Comment: social smoker as a teen  . Alcohol use No  . Drug use: No  .  Sexual activity: Not Currently   Other Topics Concern  . Not on file   Social History Narrative   Marital status: widowed since 09/16/2015      Children: 2 children (45, 43); 5 grandchild      Lives: alone in townhome      Employment: psychiatric Education officer, museum; retired in 2015      Tobacco: teenager only      Alcohol:  None      Drugs: none      Exercise: none in 2017.      ADLs: drives; independent with ADLs; no assistant devices      Advanced Directives: none; FULL CODE; no prolonged measures.     Family History  Problem Relation Age of Onset  . Colon cancer Mother   . Cancer Mother 39    colon cancer  . Melanoma Father   . Cancer Father 32    melanoma  . Colon cancer Maternal Grandfather   . Breast cancer Maternal Grandfather        Objective:    BP 138/82 (BP Location: Right Arm, Patient Position: Sitting, Cuff Size: Large)   Pulse 76   Temp 98.2 F (36.8 C) (Oral)   Resp 18   Ht 5\' 2"  (1.575 m)   Wt 241 lb (  109.3 kg)   SpO2 95%   BMI 44.08 kg/m  Physical Exam  Constitutional: She is oriented to person, place, and time. She appears well-developed and well-nourished. No distress.  HENT:  Head: Normocephalic and atraumatic.    Right Ear: External ear normal.  Left Ear: External ear normal.  Nose: Nose normal.  Mouth/Throat: Oropharynx is clear and moist.  Diffuse erythematous rash facial with scaling  Eyes: Conjunctivae and EOM are normal. Pupils are equal, round, and reactive to light.  Neck: Normal range of motion. Neck supple. Carotid bruit is not present. No thyromegaly present.  Cardiovascular: Normal rate, regular rhythm, normal heart sounds and intact distal pulses.  Exam reveals no gallop and no friction rub.   No murmur heard. Pulmonary/Chest: Effort normal and breath sounds normal. She has no wheezes. She has no rales.    R BREAST:  Scar present in upper outer quadrant.  2.5cm area of induration in upper outer quadrant.    Abdominal: Soft. Bowel  sounds are normal. She exhibits no distension and no mass. There is no tenderness. There is no rebound and no guarding.  Lymphadenopathy:    She has no cervical adenopathy.  Neurological: She is alert and oriented to person, place, and time. No cranial nerve deficit.  Skin: Skin is warm and dry. Rash noted. She is not diaphoretic. There is erythema. No pallor.  Psychiatric: She has a normal mood and affect. Her behavior is normal.   Depression screen [facility] 2/9 09/13/2016 08/10/2016 07/01/2016 06/07/2016 03/21/2016  Decreased Interest 0 0 0 2 0  Down, Depressed, Hopeless 0 0 3 3 -  PHQ - 2 Score 0 0 3 5 0  Altered sleeping - - 1 3 -  Tired, decreased energy - - 1 1 -  Change in appetite - - 0 1 -  Feeling bad or failure about yourself  - - 1 2 -  Trouble concentrating - - 0 1 -  Moving slowly or fidgety/restless - - 0 0 -  Suicidal thoughts - - 1 1 -  PHQ-9 Score - - 7 14 -  Difficult doing work/chores - - Somewhat difficult Somewhat difficult -   Fall Risk  09/13/2016 08/10/2016 07/01/2016 06/07/2016 03/21/2016  Falls in the past year? Yes No Yes Yes No  Number falls in past yr: 1 - 1 - -  Injury with Fall? Yes - No - -   Functional Status Survey: Is the patient deaf or have difficulty hearing?: No Does the patient have difficulty seeing, even when wearing glasses/contacts?: No Does the patient have difficulty concentrating, remembering, or making decisions?: No Does the patient have difficulty walking or climbing stairs?: No Does the patient have difficulty dressing or bathing?: No Does the patient have difficulty doing errands alone such as visiting a doctor's office or shopping?: No      Assessment & Plan:   1. Encounter for Medicare annual wellness exam   2. Essential hypertension   3. Need for Zostavax administration   4. Gastroesophageal reflux disease without esophagitis   5. Morbid obesity ([place])   6. S/P left TKA   7. Pure hypercholesterolemia   8. Reactive depression     9. Hyperglycemia   10. Psychophysiological insomnia   11. Ductal carcinoma in situ (DCIS) of right breast   12. Irritant contact dermatitis due to drug in contact with skin    -anticipatory guidance provided --- exercise, weight loss, low-calorie food choices. -immunizations UTD -refer for diagnostic mammogram and breast US;  R upper outer quadrant induration; followed annually by general surgery. -obtain u/a, EKG. -continue current medications. -doing better emotionally; adjusting well to death of husband one year ago. -rx for Clobetasol ointment to apply to face sparingly bid for two weeks for moderate to severe dermatitis.  Orders Placed This Encounter  Procedures  . POCT urinalysis dipstick  . EKG 12-Lead   Meds ordered this encounter  Medications  . DISCONTD: clobetasol ointment (TEMOVATE) 0.05 %    Sig: Apply 1 application topically 2 (two) times daily.    Dispense:  30 g    Refill:  0  . Zoster Vaccine Live, PF, (ZOSTAVAX) 96295 UNT/0.65ML injection    Sig: Inject 19,400 Units into the skin once.    Dispense:  0.65 mL    Refill:  0    Return in about 6 months (around 03/13/2017) for recheck high blood pressure, depression, insomnia.   Medhansh Brinkmeier Elayne Guerin, M.D. Primary [facility] at [facility] previously [facility] [address] [place], [place]  [address] [phone] phone [phone] fax

## 2016-09-13 NOTE — Patient Instructions (Signed)

## 2016-09-14 ENCOUNTER — Telehealth: Payer: Self-pay

## 2016-09-14 NOTE — Telephone Encounter (Signed)
Please advise 

## 2016-09-14 NOTE — Telephone Encounter (Signed)
PATIENT STATES SHE HAD HER COMPLETE PHYSICAL DONE WITH DR. Tamala Julian YESTERDAY (09/13/16). DR. Tamala Julian SAW HOW RED HER FACE WAS AND SHE PRESCRIBED HER TO HAVE CLOBETASOL OINTMENT. SHE WAS READING THE LEAFLET THAT CAME WITH IT AND IT SAID NOT TO USE IT ON YOUR FACE. SHE SAID SHE PAID $45.00 FOR THIS OINTMENT AND SHE WOULD LIKE TO FIND OUT WHAT SHE SHOULD DO? [facility] PHONE [phone] (CELL) PHARMACY CHOICE IS [facility] [address]. [place]

## 2016-09-16 NOTE — Telephone Encounter (Signed)
Call --- please tell patient it is fine to use a small amount of ointment on face as discussed at visit. This is not a cream she should use on her face every day or for several months.  I recommend applying small amount to face twice daily for one week and then once daily for one week and then stopping.

## 2016-09-16 NOTE — Telephone Encounter (Signed)
L/m with dr smiths note 

## 2016-09-21 DIAGNOSIS — R922 Inconclusive mammogram: Secondary | ICD-10-CM | POA: Diagnosis not present

## 2016-09-21 DIAGNOSIS — R928 Other abnormal and inconclusive findings on diagnostic imaging of breast: Secondary | ICD-10-CM | POA: Diagnosis not present

## 2016-09-21 DIAGNOSIS — N6310 Unspecified lump in the right breast, unspecified quadrant: Secondary | ICD-10-CM | POA: Diagnosis not present

## 2016-09-21 DIAGNOSIS — N6311 Unspecified lump in the right breast, upper outer quadrant: Secondary | ICD-10-CM | POA: Diagnosis not present

## 2016-09-21 LAB — HM MAMMOGRAPHY

## 2016-09-28 ENCOUNTER — Ambulatory Visit (INDEPENDENT_AMBULATORY_CARE_PROVIDER_SITE_OTHER): Payer: PPO | Admitting: General Surgery

## 2016-09-28 ENCOUNTER — Encounter: Payer: Self-pay | Admitting: General Surgery

## 2016-09-28 VITALS — BP 138/72 | HR 72 | Resp 12 | Ht 63.0 in | Wt 239.0 lb

## 2016-09-28 DIAGNOSIS — D0511 Intraductal carcinoma in situ of right breast: Secondary | ICD-10-CM | POA: Diagnosis not present

## 2016-09-28 NOTE — Patient Instructions (Addendum)
The patient is aware to call back for any questions or concerns. .The patient has been asked to have a 6 month right diagnostic mammogram, she will phone the office after mammogram for a report. She will then has been asked to return to the office in 13 months with a bilateral diagnostic mammogram.

## 2016-09-28 NOTE — Progress Notes (Signed)
Patient ID: Selena Bush, female   DOB: 23-Apr-1946, 71 y.o.   MRN: [id number]  Chief Complaint  Patient presents with  . Follow-up    HPI Selena Bush is a 71 y.o. female.  who presents for her follow up breast cancer and breast evaluation. The most recent mammogram was done on 09-21-16. This was done sooner than scheduled because Dr Tamala Julian found a lump in the right breast. She can feel the lump but not prior to Dr Tamala Julian noticing it. She feels it is at the site of the mammosite. Patient does perform regular self breast checks and gets regular mammograms done.  No new breast issues.  HPI  Past Medical History:  Diagnosis Date  . Anxiety   . Chronic diastolic CHF (congestive heart failure) ([place])    a. echo 09/2014: EF [id number], diastolic dysfunction, mild LVH, nl RV size & systolic function, mildly dilated LA (4.3 cm), mild MR/TR, mildly elevated PASP 36.7 mm Hg  . DDD (degenerative disc disease), cervical   . DDD (degenerative disc disease), lumbar   . Depression   . Diffuse cystic mastopathy 2014  . GERD (gastroesophageal reflux disease)   . Headache    rare  . HLD (hyperlipidemia)    a. statin intolerant 2/2 myalgias  . Hypercholesterolemia   . Hypertension   . Malignant neoplasm of upper-outer quadrant of female breast ([place]) 10/2012   Papillary DCIS, sentinel node negative. DR/PR positive. PARTIAL RIGHT MASTECTOMY FOR BREAST CANCER--HAD RADIATION - NO CHEMO --DR. [place] ONCOLOGIST  . Obesity   . OSA on CPAP   . Osteoarthritis of both knees    a. s/p right TKA 04/2013 & left TKA 09/2014  . Otitis externa   . Sleep difficulties    LUNESTA HAS HELPED  . Vaginal cyst     Past Surgical History:  Procedure Laterality Date  . ABDOMINAL HYSTERECTOMY  1992   DUB; fibroids; endometriosis.  One remaining ovary.    [name] BREAST SURGERY Right March 2014   Wide excision,APB RT 10 mm papillary DCIS, ER/PR positive. Sentinel node negative. Partial breast radiation.  [name] CATARACT  EXTRACTION, BILATERAL  02/13/2016   [facility].  . CHOLECYSTECTOMY  1994  . COLONOSCOPY  2015    1 benign polyp-every 5 years  . ERCP  1995  . JOINT REPLACEMENT Right Sept 2014   knee  . TOTAL KNEE ARTHROPLASTY Right 04/16/2013   Procedure: RIGHT TOTAL KNEE ARTHROPLASTY;  Surgeon: Mauri Pole, MD;  Location: [facility];  Service: Orthopedics;  Laterality: Right;  . TOTAL KNEE ARTHROPLASTY Left 09/15/2014   Procedure: LEFT TOTAL KNEE ARTHROPLASTY;  Surgeon: Mauri Pole, MD;  Location: [facility];  Service: Orthopedics;  Laterality: Left;  . TUBAL LIGATION  1979    Family History  Problem Relation Age of Onset  . Colon cancer Mother   . Cancer Mother 29    colon cancer  . Melanoma Father   . Cancer Father 79    melanoma  . Colon cancer Maternal Grandfather   . Breast cancer Maternal Grandfather     Social History Social History  Substance Use Topics  . Smoking status: Never Smoker  . Smokeless tobacco: Never Used     Comment: social smoker as a teen  . Alcohol use No    Allergies  Allergen Reactions  . Asa [Aspirin]   . Codeine     Gi problems   . Penicillins     anaphylaxis    . Statins Other (  See Comments)    Leg pains  . Sulfa Antibiotics     anaphylaxis     Current Outpatient Prescriptions  Medication Sig Dispense Refill  . atenolol (TENORMIN) 50 MG tablet Take 1 tablet (50 mg total) by mouth daily. 90 tablet 3  . busPIRone (BUSPAR) 15 MG tablet Take 2 tablets (30 mg total) by mouth at bedtime. 180 tablet 3  . clindamycin (CLEOCIN) 300 MG capsule TK 2 CS PO 1 HOUR PRIOR TO TREATMENT/PRN  0  . colchicine 0.6 MG tablet Take 1 tablet (0.6 mg total) by mouth 2 (two) times daily. (Patient taking differently: Take 0.6 mg by mouth as needed. ) 30 tablet 0  . DULoxetine (CYMBALTA) 60 MG capsule Take 2 capsules (120 mg total) by mouth daily. 180 capsule 3  . fluticasone (FLONASE) 50 MCG/ACT nasal spray Place 2 sprays into both nostrils daily as needed for rhinitis or  allergies. 16 g 11  . furosemide (LASIX) 20 MG tablet TAKE 1 TABLET BY MOUTH TWICE DAILY 180 tablet 1  . ibuprofen (ADVIL,MOTRIN) 200 MG tablet Take by mouth 2 (two) times daily.     [name] lisinopril-hydrochlorothiazide (PRINZIDE,ZESTORETIC) 20-12.5 MG tablet Take 1 tablet by mouth daily. 90 tablet 1  . NEXIUM 40 MG capsule Take 40 mg by mouth every morning.     . raloxifene (EVISTA) 60 MG tablet Take 1 tablet (60 mg total) by mouth daily. 90 tablet 4  . spironolactone (ALDACTONE) 25 MG tablet Take 1 tablet (25 mg total) by mouth daily. 90 tablet 3  . Suvorexant (BELSOMRA) 20 MG TABS Take 1 tablet by mouth at bedtime. 30 tablet 5  . tiZANidine (ZANAFLEX) 4 MG tablet TAKE 1 TABLET BY MOUTH AT SUPPER AND TAKE 1 TABLET BY MOUTH AT BEDTIME 180 tablet 1   No current facility-administered medications for this visit.     Review of Systems Review of Systems  Constitutional: Negative.   Respiratory: Negative.   Cardiovascular: Negative.     Blood pressure 138/72, pulse 72, resp. rate 12, height 5\' 3"  (1.6 m), weight 239 lb (108.4 kg).  Physical Exam Physical Exam  Constitutional: She is oriented to person, place, and time. She appears well-developed and well-nourished.  HENT:  Mouth/Throat: Oropharynx is clear and moist.  Eyes: Conjunctivae are normal. No scleral icterus.  Neck: Neck supple.  Cardiovascular: Normal rate, regular rhythm and normal heart sounds.   Pulmonary/Chest: Effort normal and breath sounds normal. Right breast exhibits no inverted nipple, no mass, no nipple discharge, no skin change and no tenderness. Left breast exhibits no inverted nipple, no mass, no nipple discharge, no skin change and no tenderness.    Thickening middle portion of the incision right breast.  Lymphadenopathy:    She has no cervical adenopathy.    She has no axillary adenopathy.  Neurological: She is alert and oriented to person, place, and time.  Skin: Skin is warm and dry.  Psychiatric: Her  behavior is normal.    Data Reviewed Lateral diagnostic mammograms and right breast ultrasound completed at [facility] dated 09/21/2016 reviewed. Postsurgical changes on the right. 6 month follow-up recommended. BI-RADS-3.  Assessment    Stable right breast thickening status post wide excision/accelerated partial breast radiation.    Plan    The patient will have a 6 month follow-up mammogram is recommended by the radiologist. No office visit to follow. She will call the office the day of the study so the films can be independently reviewed.  We'll plan for follow-up  exam in 13 months with repeat bilateral diagnostic mammograms at that time.      The patient has been asked to have a 6 month right diagnostic mammogram, she will phone the office after mammogram for a report. She will then be asked to return to the office in  13 months with a bilateral diagnostic mammogram.   This information has been scribed by Karie Fetch RN, BSN,BC.   Robert Bellow 09/28/2016, 5:14 PM

## 2016-10-04 ENCOUNTER — Encounter: Payer: Self-pay | Admitting: Family Medicine

## 2016-11-04 ENCOUNTER — Encounter: Payer: Self-pay | Admitting: Family Medicine

## 2016-11-07 ENCOUNTER — Telehealth: Payer: Self-pay | Admitting: Family Medicine

## 2016-11-07 DIAGNOSIS — N289 Disorder of kidney and ureter, unspecified: Secondary | ICD-10-CM

## 2016-11-07 NOTE — Telephone Encounter (Signed)
PATIENT STATES SHE HAD HER PHYSICAL DONE IN January WITH DR. Tamala Julian. SHE GOT HER LAB RESULTS MAILED TO HER AND DR. Tamala Julian ALSO WENT OVER THEM WITH HER. HOWEVER, SHE HAS 2 ADDITIONAL QUESTIONS FOR DR. Tamala Julian. SHE WOULD LIKE TO KNOW IF SHE CAN COME INTO THE OFFICE JUST TO GET HER CREATININE CHECKED SINCE IT WAS ELEVATED? SHE ALSO WANTS TO KNOW IF SHE SHOULD CONTINUE TO TAKE HER NEXIUM SINCE HER KIDNEY VALUE WAS ALSO ELEVATED? BEST PHONE [phone] (CELL) PHARMACY CHOICE IS [facility] [address] IN [place]. [address]

## 2016-11-08 NOTE — Telephone Encounter (Signed)
Call --- 1. Yes, patient can present for a lab visit only to have creatinine repeated.  2.  As mentioned in her lab letter, I recommend avoiding Ibuprofen, Aleve, Advil, and Motrin.  I do not recommend stopping her Nexium abruptly.  Now, I do not recommend her taking Nexium daily if she really does not need it; she could try decreasing Nexium to every other day dosing and see how her GERD symptoms do on this lower dose.

## 2016-11-10 NOTE — Telephone Encounter (Signed)
Pt advised.

## 2016-11-23 ENCOUNTER — Telehealth: Payer: Self-pay | Admitting: Family Medicine

## 2016-11-23 NOTE — Telephone Encounter (Signed)
PATIENT WOULD LIKE TO ASK DR. Tamala Julian HOW SHE CAN GO OFF OF SUVOREXANT (BELSOMRA) 20 MG? SHE KNOWS THAT SHE CAN'T JUST STOP TAKING IT. SHE SAID THE MEDICINE COST HER $85.00 PER MONTH AND ALSO HER KIDNEY VALUES HAS BEEN RUNNING HIGH. SHE WOULD LIKE TO GET OFF OF AS MANY MEDICATIONS AS SHE CAN. BEST PHONE [phone] (CELL) [place]

## 2016-11-23 NOTE — Telephone Encounter (Signed)
Please advise on how to wean

## 2016-11-28 ENCOUNTER — Other Ambulatory Visit: Payer: Self-pay | Admitting: Family Medicine

## 2016-11-28 DIAGNOSIS — I5032 Chronic diastolic (congestive) heart failure: Secondary | ICD-10-CM

## 2016-11-28 NOTE — Telephone Encounter (Signed)
l/m with md instructions

## 2016-11-28 NOTE — Telephone Encounter (Signed)
I would decrease Belsomra to 1/2 tablet at bedtime for two weeks and then stop medication.If she cannot half the tablets, I can send in a 10mg  tablet.  Also, please let her know that Belsomra is cleared mostly by the LIVER and not the kidneys.

## 2016-12-01 ENCOUNTER — Other Ambulatory Visit: Payer: Self-pay | Admitting: Family Medicine

## 2016-12-01 DIAGNOSIS — I10 Essential (primary) hypertension: Secondary | ICD-10-CM

## 2016-12-07 ENCOUNTER — Other Ambulatory Visit: Payer: Self-pay | Admitting: Family Medicine

## 2016-12-07 NOTE — Telephone Encounter (Signed)
08/2016 last ov 10.2017 last refill

## 2016-12-07 NOTE — Telephone Encounter (Signed)
Pt called needing a refill on BELSOMRA 20 MG TABS [[id number]]   Please advise:[phone]

## 2016-12-09 MED ORDER — SUVOREXANT 20 MG PO TABS: 1.0000 | ORAL_TABLET | Freq: Every day | ORAL | 5 refills | Status: DC

## 2016-12-09 NOTE — Telephone Encounter (Signed)
[facility] 

## 2016-12-12 ENCOUNTER — Telehealth: Payer: Self-pay | Admitting: Family Medicine

## 2016-12-12 DIAGNOSIS — Z9989 Dependence on other enabling machines and devices: Principal | ICD-10-CM

## 2016-12-12 DIAGNOSIS — G4733 Obstructive sleep apnea (adult) (pediatric): Secondary | ICD-10-CM

## 2016-12-12 NOTE — Telephone Encounter (Signed)
DR Tamala Julian PT CALLING BECAUSE HER CPAP MACHINE IS SO OLD IT BROKE AND LEAKING WATER SHE NEED A NEW RX FOR A CPAP PLEASE FAX IT TO [place] [phone]

## 2016-12-13 NOTE — Telephone Encounter (Signed)
I dont see a copy of sleep study in chart

## 2016-12-13 NOTE — Telephone Encounter (Signed)
Please call patient ---- WHERE did she have sleep study completed and what year?

## 2016-12-13 NOTE — Telephone Encounter (Signed)
[facility] dr Venia Minks ,  Pt believes [facility] has all that info as well as they supply her currect machine and supplies, she just needs an order for a new machine to replace old.

## 2016-12-23 NOTE — Telephone Encounter (Signed)
Please call [facility] and see if they have a copy of sleep study.  If they do not, please call [facility] in [place] to see if they can fax a copy of sleep study to me.

## 2016-12-23 NOTE — Telephone Encounter (Signed)
Faxed order to [facility]

## 2017-01-04 ENCOUNTER — Telehealth: Payer: Self-pay | Admitting: Family Medicine

## 2017-01-04 NOTE — Telephone Encounter (Signed)
L/m see prior note Faxed order 12/12/16 and advised order was faxed. They may contact [facility] for copy of study and settings, if to old...need to let patient know

## 2017-01-04 NOTE — Telephone Encounter (Signed)
LYNN FROM [facility] CALLED STATING THAT PT NEED A CPAP WITH SETTING AND A COPY OF SLEEP STUDIES PLEASE CALL LYNN AT [phone] EXT [phone]

## 2017-01-11 ENCOUNTER — Telehealth: Payer: Self-pay | Admitting: Family Medicine

## 2017-01-11 NOTE — Telephone Encounter (Signed)
[facility] [phone] EXT [phone] CALLED TO GET SLEEP STUDY SETTINGS I ADVISED HER TO CALL  [facility] THEY MAY HAVE THE SETTINGS BECAUSE THEY NEVER FAXED THEM TO [place] THAT DR MALONEY MAY HAVE THEM DR SMITH CANT WRITE A RX FOR THE SETTINGS BECAUSE SHE DOESN'T KNOW THAT SETTINGS TO WRITE RX FOR

## 2017-01-11 NOTE — Telephone Encounter (Signed)
PATIENT WOULD LIKE DR. Tamala Julian TO CALL HER AND NOT A NURSE TELLING HER WHAT DR. Tamala Julian SAID: PATIENT STATES [facility] CALLED HER TO SAY DR. Tamala Julian NEEDS TO WRITE A PRESCRIPTION FOR A C PAP MACHINE. SHE SAID THE PRESCRIPTION CAN NOT BE WRITTEN BY DR. Venia Minks BECAUSE SHE HAS MOVED OUT OF STATE. SHE SAID HER MACHINE IS 69 YEARS OLD.  BEST PHONE [phone] (CELL)  [place]

## 2017-01-11 NOTE — Telephone Encounter (Signed)
I have explained this to her and [facility] several times.  They want the cpap settings and that would need to come from the place ([facility]) where she had the study done. I did fax over everything we had and [facility] states the settings are missing so I asked them to get that part of the report from [place] where she had it done. (see prior phone notes) im not sure what else to do besides getting another study since its greater than 69 years old?

## 2017-01-13 NOTE — Telephone Encounter (Signed)
I called [facility]; I requested a copy of sleep study be faxed to [phone].

## 2017-01-22 ENCOUNTER — Other Ambulatory Visit: Payer: Self-pay | Admitting: Family Medicine

## 2017-01-22 DIAGNOSIS — M62838 Other muscle spasm: Secondary | ICD-10-CM

## 2017-01-23 ENCOUNTER — Telehealth: Payer: Self-pay | Admitting: Family Medicine

## 2017-01-23 NOTE — Telephone Encounter (Signed)
Selena Bush from [facility] called for pt concerning CPAP machine. Huey Romans is not able to locate the diagnostic part of last sleep study so they are requesting pt has a referral for a new diagnostic and somnogram. They said titration can be added if needed but they at least need the other. They would like the order and new sleep study faxed once available to [phone]. Selena Bush can be reached at [phone] for any additional questions. Thanks!

## 2017-01-24 ENCOUNTER — Telehealth: Payer: Self-pay | Admitting: Family Medicine

## 2017-01-24 NOTE — Telephone Encounter (Signed)
CALLED BOTH NUMBERS NO ANSWER LMOM TO CALL AND RESCHEDULE APPOINTMENT THAT SHE HAD WITH SMITH ON 03-15-2017 SHE WILL BE OUT OF THE OFFICE THAT DAY

## 2017-01-25 NOTE — Telephone Encounter (Signed)
Please advise 

## 2017-01-30 DIAGNOSIS — L219 Seborrheic dermatitis, unspecified: Secondary | ICD-10-CM | POA: Diagnosis not present

## 2017-02-13 ENCOUNTER — Ambulatory Visit: Payer: PPO | Admitting: Family Medicine

## 2017-02-21 ENCOUNTER — Encounter: Payer: Self-pay | Admitting: Family Medicine

## 2017-02-21 ENCOUNTER — Ambulatory Visit (INDEPENDENT_AMBULATORY_CARE_PROVIDER_SITE_OTHER): Payer: PPO | Admitting: Family Medicine

## 2017-02-21 VITALS — BP 143/86 | HR 73 | Temp 98.0°F | Resp 18 | Ht 62.21 in | Wt 235.0 lb

## 2017-02-21 DIAGNOSIS — Z96652 Presence of left artificial knee joint: Secondary | ICD-10-CM | POA: Diagnosis not present

## 2017-02-21 DIAGNOSIS — F329 Major depressive disorder, single episode, unspecified: Secondary | ICD-10-CM

## 2017-02-21 DIAGNOSIS — I10 Essential (primary) hypertension: Secondary | ICD-10-CM | POA: Diagnosis not present

## 2017-02-21 DIAGNOSIS — F419 Anxiety disorder, unspecified: Secondary | ICD-10-CM | POA: Diagnosis not present

## 2017-02-21 DIAGNOSIS — F5104 Psychophysiologic insomnia: Secondary | ICD-10-CM

## 2017-02-21 DIAGNOSIS — K219 Gastro-esophageal reflux disease without esophagitis: Secondary | ICD-10-CM | POA: Diagnosis not present

## 2017-02-21 DIAGNOSIS — G4733 Obstructive sleep apnea (adult) (pediatric): Secondary | ICD-10-CM

## 2017-02-21 DIAGNOSIS — M170 Bilateral primary osteoarthritis of knee: Secondary | ICD-10-CM | POA: Diagnosis not present

## 2017-02-21 DIAGNOSIS — E7800 Pure hypercholesterolemia, unspecified: Secondary | ICD-10-CM | POA: Diagnosis not present

## 2017-02-21 DIAGNOSIS — Z9989 Dependence on other enabling machines and devices: Secondary | ICD-10-CM | POA: Diagnosis not present

## 2017-02-21 NOTE — Progress Notes (Signed)
Subjective:    Patient ID: Selena Bush, female    DOB: [birth year], 71 y.o.   MRN: [id number]  02/21/2017  Hypertension (6 month follow-up); Depression; and Insomnia (needs a sleep study)   HPI This 71 y.o. female presents for six month follow-up and for evaluation of OSA with CPAP.  Respiratory care provider asked for copy of sleep study.  [facility] looked for sleep study; could not find sleep study copy; s/p four sleep studies.  [facility] recommended scheduling a repeat sleep study; desires to perform at hospital.  Slowly feeling better emotionally; no longer reading or knitting.  Hopeful.  Has been beautiful BP on four medications; did not take medication this morning.  ATenolol/Spironolactone/furosemide 20mg  one daily/Lisinopril-HCTZ 20-12.5 Duloxetine 60mg  two daily for depression/anxiety. Osteoarthritis knee:  Struggles with ambulation.   Nexium daily for GERD.   Not taking Advil or similar medications with renal insufficiency. Belsomra qhs; some nights only sleeping 4 hours per night; can retur nto sleep.   Every other month; will happyn when full moon.  Taking Tizanidine qhs; helps for muslce spasms in back.  By nighttime, back suffers with spasm.  More active than in past.  Has decreased on servings size.    BP Readings from Last 3 Encounters:  02/21/17 (!) 143/86  09/28/16 138/72  09/13/16 138/82   Wt Readings from Last 3 Encounters:  02/21/17 235 lb (106.6 kg)  09/28/16 239 lb (108.4 kg)  09/13/16 241 lb (109.3 kg)   Immunization History  Administered Date(s) Administered  . Influenza, High Dose Seasonal PF 05/05/2015  . Influenza,inj,Quad PF,36+ Mos 06/07/2016  . Pneumococcal Conjugate-13 02/06/2016  . Pneumococcal Polysaccharide-23 09/22/2014    Review of Systems  Constitutional: Negative for chills, diaphoresis, fatigue and fever.  Eyes: Negative for visual disturbance.  Respiratory: Negative for cough and shortness of breath.   Cardiovascular: Negative for chest pain,  palpitations and leg swelling.  Gastrointestinal: Negative for abdominal pain, constipation, diarrhea, nausea and vomiting.  Endocrine: Negative for cold intolerance, heat intolerance, polydipsia, polyphagia and polyuria.  Musculoskeletal: Positive for arthralgias, back pain and gait problem.  Neurological: Negative for dizziness, tremors, seizures, syncope, facial asymmetry, speech difficulty, weakness, light-headedness, numbness and headaches.  Psychiatric/Behavioral: Positive for dysphoric mood and sleep disturbance. The patient is nervous/anxious.     Past Medical History:  Diagnosis Date  . Anxiety   . Chronic diastolic CHF (congestive heart failure) ([place])    a. echo 09/2014: EF 99-37%, diastolic dysfunction, mild LVH, nl RV size & systolic function, mildly dilated LA (4.3 cm), mild MR/TR, mildly elevated PASP 36.7 mm Hg  . DDD (degenerative disc disease), cervical   . DDD (degenerative disc disease), lumbar   . Depression   . Diffuse cystic mastopathy 2014  . GERD (gastroesophageal reflux disease)   . Headache    rare  . HLD (hyperlipidemia)    a. statin intolerant 2/2 myalgias  . Hypercholesterolemia   . Hypertension   . Malignant neoplasm of upper-outer quadrant of female breast ([place]) 10/2012   Papillary DCIS, sentinel node negative. DR/PR positive. PARTIAL RIGHT MASTECTOMY FOR BREAST CANCER--HAD RADIATION - NO CHEMO --DR. [place] ONCOLOGIST  . Obesity   . OSA on CPAP   . Osteoarthritis of both knees    a. s/p right TKA 04/2013 & left TKA 09/2014  . Otitis externa   . Sleep difficulties    LUNESTA HAS HELPED  . Vaginal cyst    Past Surgical History:  Procedure Laterality Date  . ABDOMINAL HYSTERECTOMY  1992   DUB; fibroids; endometriosis.  One remaining ovary.    [name] BREAST SURGERY Right March 2014   Wide excision,APB RT 10 mm papillary DCIS, ER/PR positive. Sentinel node negative. Partial breast radiation.  [name] CATARACT EXTRACTION, BILATERAL  02/13/2016   [facility].   . CHOLECYSTECTOMY  1994  . COLONOSCOPY  2015    1 benign polyp-every 5 years  . ERCP  1995  . JOINT REPLACEMENT Right Sept 2014   knee  . TOTAL KNEE ARTHROPLASTY Right 04/16/2013   Procedure: RIGHT TOTAL KNEE ARTHROPLASTY;  Surgeon: Mauri Pole, MD;  Location: [facility];  Service: Orthopedics;  Laterality: Right;  . TOTAL KNEE ARTHROPLASTY Left 09/15/2014   Procedure: LEFT TOTAL KNEE ARTHROPLASTY;  Surgeon: Mauri Pole, MD;  Location: [facility];  Service: Orthopedics;  Laterality: Left;  . TUBAL LIGATION  1979   Allergies  Allergen Reactions  . Asa [Aspirin]   . Codeine     Gi problems   . Penicillins     anaphylaxis    . Statins Other (See Comments)    Leg pains  . Sulfa Antibiotics     anaphylaxis     Social History   Social History  . Marital status: Widowed    Spouse name: Chrissie Noa  . Number of children: 2  . Years of education: College   Occupational History  . Retired    Social History Main Topics  . Smoking status: Never Smoker  . Smokeless tobacco: Never Used     Comment: social smoker as a teen  . Alcohol use No  . Drug use: No  . Sexual activity: Not Currently   Other Topics Concern  . Not on file   Social History Narrative   Marital status: widowed since 09/16/2015      Children: 2 children (45, 42); 5 grandchild      Lives: alone in townhome      Employment: psychiatric Education officer, [employer]; retired in 2015      Tobacco: teenager only      Alcohol:  None      Drugs: none      Exercise: none in 2017.      ADLs: drives; independent with ADLs; no assistant devices      Advanced Directives: none; FULL CODE; no prolonged measures.     Family History  Problem Relation Age of Onset  . Colon cancer Mother   . Cancer Mother 58       colon cancer  . Melanoma Father   . Cancer Father 39       melanoma  . Colon cancer Maternal Grandfather   . Breast cancer Maternal Grandfather        Objective:    BP (!) 143/86   Pulse 73   Temp 98 F (36.7 C) (Oral)    Resp 18   Ht 5' 2.21" (1.58 m)   Wt 235 lb (106.6 kg)   SpO2 93%   BMI 42.70 kg/m  Physical Exam  Constitutional: She is oriented to person, place, and time. She appears well-developed and well-nourished. No distress.  HENT:  Head: Normocephalic and atraumatic.  Right Ear: External ear normal.  Left Ear: External ear normal.  Nose: Nose normal.  Mouth/Throat: Oropharynx is clear and moist.  Eyes: Pupils are equal, round, and reactive to light. Conjunctivae and EOM are normal.  Neck: Normal range of motion. Neck supple. Carotid bruit is not present. No thyromegaly present.  Cardiovascular: Normal rate, regular rhythm, normal heart sounds  and intact distal pulses.  Exam reveals no gallop and no friction rub.   No murmur heard. Pulmonary/Chest: Effort normal and breath sounds normal. She has no wheezes. She has no rales.  Abdominal: Soft. Bowel sounds are normal. She exhibits no distension and no mass. There is no tenderness. There is no rebound and no guarding.  Lymphadenopathy:    She has no cervical adenopathy.  Neurological: She is alert and oriented to person, place, and time. No cranial nerve deficit.  Skin: Skin is warm and dry. No rash noted. She is not diaphoretic. No erythema. No pallor.  Psychiatric: She has a normal mood and affect. Her behavior is normal.   Results for orders placed or performed in visit on 10/07/16  HM MAMMOGRAPHY  Result Value Ref Range   HM Mammogram 0-4 Bi-Rad 0-4 Bi-Rad, Self Reported Normal       Assessment & Plan:   1. OSA on CPAP   2. Essential hypertension, benign   3. Gastroesophageal reflux disease without esophagitis   4. Primary osteoarthritis of both knees   5. Anxiety   6. S/P left TKA   7. Psychophysiological insomnia   8. Pure hypercholesterolemia   9. Reactive depression    -stable chronic medical conditions; no change in management. -emotionally better; depression less severe and now becoming more active with resulting  weight loss.  Encourage ongoing weight loss and increased activity. -refer for sleep study for CPAP titration per medicare requirements   Orders Placed This Encounter  Procedures  . Ambulatory referral to Sleep Studies    Referral Priority:   Routine    Referral Type:   Consultation    Referral Reason:   Specialty Services Required    Number of Visits Requested:   1   No orders of the defined types were placed in this encounter.   Return in about 6 months (around 08/24/2017) for complete physical examiniation.   Katelen Luepke Elayne Bush, M.D. Primary Care at [facility] previously [facility] [address] [place], [place]  [phone] phone [phone] fax

## 2017-02-21 NOTE — Patient Instructions (Addendum)
   IF you received an x-ray today, you will receive an invoice from [facility]. Please contact [facility] at [phone] with questions or concerns regarding your invoice.   IF you received labwork today, you will receive an invoice from [facility]. Please contact [facility] at [phone] with questions or concerns regarding your invoice.   Our billing staff will not be able to assist you with questions regarding bills from these companies.  You will be contacted with the lab results as soon as they are available. The fastest way to get your results is to activate your My Chart account. Instructions are located on the last page of this paperwork. If you have not heard from us regarding the results in 2 weeks, please contact this office.      Sleep Apnea Sleep apnea is a condition in which breathing pauses or becomes shallow during sleep. Episodes of sleep apnea usually last 10 seconds or longer, and they may occur as many as 20 times an hour. Sleep apnea disrupts your sleep and keeps your body from getting the rest that it needs. This condition can increase your risk of certain health problems, including:  Heart attack.  Stroke.  Obesity.  Diabetes.  Heart failure.  Irregular heartbeat.  There are three kinds of sleep apnea:  Obstructive sleep apnea. This kind is caused by a blocked or collapsed airway.  Central sleep apnea. This kind happens when the part of the brain that controls breathing does not send the correct signals to the muscles that control breathing.  Mixed sleep apnea. This is a combination of obstructive and central sleep apnea.  What are the causes? The most common cause of this condition is a collapsed or blocked airway. An airway can collapse or become blocked if:  Your throat muscles are abnormally relaxed.  Your tongue and tonsils are larger than normal.  You are overweight.  Your airway is smaller than normal.  What increases  the risk? This condition is more likely to develop in people who:  Are overweight.  Smoke.  Have a smaller than normal airway.  Are elderly.  Are female.  Drink alcohol.  Take sedatives or tranquilizers.  Have a family history of sleep apnea.  What are the signs or symptoms? Symptoms of this condition include:  Trouble staying asleep.  Daytime sleepiness and tiredness.  Irritability.  Loud snoring.  Morning headaches.  Trouble concentrating.  Forgetfulness.  Decreased interest in sex.  Unexplained sleepiness.  Mood swings.  Personality changes.  Feelings of depression.  Waking up often during the night to urinate.  Dry mouth.  Sore throat.  How is this diagnosed? This condition may be diagnosed with:  A medical history.  A physical exam.  A series of tests that are done while you are sleeping (sleep study). These tests are usually done in a sleep lab, but they may also be done at home.  How is this treated? Treatment for this condition aims to restore normal breathing and to ease symptoms during sleep. It may involve managing health issues that can affect breathing, such as high blood pressure or obesity. Treatment may include:  Sleeping on your side.  Using a decongestant if you have nasal congestion.  Avoiding the use of depressants, including alcohol, sedatives, and narcotics.  Losing weight if you are overweight.  Making changes to your diet.  Quitting smoking.  Using a device to open your airway while you sleep, such as: ? An oral appliance. This is a   custom-made mouthpiece that shifts your lower jaw forward. ? A continuous positive airway pressure (CPAP) device. This device delivers oxygen to your airway through a mask. ? A nasal expiratory positive airway pressure (EPAP) device. This device has valves that you put into each nostril. ? A bi-level positive airway pressure (BPAP) device. This device delivers oxygen to your airway  through a mask.  Surgery if other treatments do not work. During surgery, excess tissue is removed to create a wider airway.  It is important to get treatment for sleep apnea. Without treatment, this condition can lead to:  High blood pressure.  Coronary artery disease.  (Men) An inability to achieve or maintain an erection (impotence).  Reduced thinking abilities.  Follow these instructions at home:  Make any lifestyle changes that your health care provider recommends.  Eat a healthy, well-balanced diet.  Take over-the-counter and prescription medicines only as told by your health care provider.  Avoid using depressants, including alcohol, sedatives, and narcotics.  Take steps to lose weight if you are overweight.  If you were given a device to open your airway while you sleep, use it only as told by your health care provider.  Do not use any tobacco products, such as cigarettes, chewing tobacco, and e-cigarettes. If you need help quitting, ask your health care provider.  Keep all follow-up visits as told by your health care provider. This is important. Contact a health care provider if:  The device that you received to open your airway during sleep is uncomfortable or does not seem to be working.  Your symptoms do not improve.  Your symptoms get worse. Get help right away if:  You develop chest pain.  You develop shortness of breath.  You develop discomfort in your back, arms, or stomach.  You have trouble speaking.  You have weakness on one side of your body.  You have drooping in your face. These symptoms may represent a serious problem that is an emergency. Do not wait to see if the symptoms will go away. Get medical help right away. Call your local emergency services (911 in the [place] Do not drive yourself to the hospital. This information is not intended to replace advice given to you by your health care provider. Make sure you discuss any questions you  have with your health care provider. Document Released: 07/22/2002 Document Revised: 03/27/2016 Document Reviewed: 05/11/2015 Elsevier Interactive Patient Education  2018 Elsevier Inc.  

## 2017-02-27 NOTE — Telephone Encounter (Signed)
Patient evaluated in office; referral for repeat sleep study placed.

## 2017-03-14 DIAGNOSIS — D692 Other nonthrombocytopenic purpura: Secondary | ICD-10-CM | POA: Diagnosis not present

## 2017-03-14 DIAGNOSIS — L219 Seborrheic dermatitis, unspecified: Secondary | ICD-10-CM | POA: Diagnosis not present

## 2017-03-15 ENCOUNTER — Ambulatory Visit: Payer: PPO | Admitting: Family Medicine

## 2017-04-14 DIAGNOSIS — L219 Seborrheic dermatitis, unspecified: Secondary | ICD-10-CM | POA: Diagnosis not present

## 2017-04-14 DIAGNOSIS — D2339 Other benign neoplasm of skin of other parts of face: Secondary | ICD-10-CM | POA: Diagnosis not present

## 2017-04-19 DIAGNOSIS — R921 Mammographic calcification found on diagnostic imaging of breast: Secondary | ICD-10-CM | POA: Diagnosis not present

## 2017-04-19 DIAGNOSIS — N6310 Unspecified lump in the right breast, unspecified quadrant: Secondary | ICD-10-CM | POA: Diagnosis not present

## 2017-04-19 DIAGNOSIS — R928 Other abnormal and inconclusive findings on diagnostic imaging of breast: Secondary | ICD-10-CM | POA: Diagnosis not present

## 2017-04-19 LAB — HM MAMMOGRAPHY

## 2017-04-21 ENCOUNTER — Other Ambulatory Visit: Payer: Self-pay | Admitting: General Surgery

## 2017-04-21 DIAGNOSIS — D0510 Intraductal carcinoma in situ of unspecified breast: Secondary | ICD-10-CM

## 2017-04-22 ENCOUNTER — Other Ambulatory Visit: Payer: Self-pay | Admitting: Family Medicine

## 2017-04-22 DIAGNOSIS — M62838 Other muscle spasm: Secondary | ICD-10-CM

## 2017-05-01 ENCOUNTER — Telehealth: Payer: Self-pay | Admitting: *Deleted

## 2017-05-01 NOTE — Telephone Encounter (Signed)
Patient had mammogram right diagnostic mammogram done on 04/19/17, patient is confused on when she needs to have another mammogram. She states the radiologist recommended to return in 6 months for a bilateral mammogram. But she thinks that is too many mammograms. She is in our recalls for 10/2017 bilateral diagnostic mammogram. She would like this to be clarified with you so she knows what to do. Please advise.

## 2017-05-02 ENCOUNTER — Other Ambulatory Visit: Payer: Self-pay | Admitting: Family Medicine

## 2017-05-02 ENCOUNTER — Other Ambulatory Visit: Payer: Self-pay

## 2017-05-02 DIAGNOSIS — M62838 Other muscle spasm: Secondary | ICD-10-CM

## 2017-05-02 MED ORDER — TIZANIDINE HCL 4 MG PO TABS: ORAL_TABLET | ORAL | 5 refills | Status: DC

## 2017-05-05 ENCOUNTER — Ambulatory Visit: Payer: PPO | Attending: Neurology

## 2017-05-05 DIAGNOSIS — G4730 Sleep apnea, unspecified: Secondary | ICD-10-CM | POA: Diagnosis not present

## 2017-05-05 DIAGNOSIS — G4761 Periodic limb movement disorder: Secondary | ICD-10-CM | POA: Diagnosis not present

## 2017-05-05 DIAGNOSIS — R0683 Snoring: Secondary | ICD-10-CM | POA: Diagnosis not present

## 2017-05-05 DIAGNOSIS — F5101 Primary insomnia: Secondary | ICD-10-CM | POA: Diagnosis not present

## 2017-05-08 DIAGNOSIS — H26493 Other secondary cataract, bilateral: Secondary | ICD-10-CM | POA: Diagnosis not present

## 2017-05-08 DIAGNOSIS — I10 Essential (primary) hypertension: Secondary | ICD-10-CM | POA: Diagnosis not present

## 2017-05-23 ENCOUNTER — Other Ambulatory Visit: Payer: Self-pay | Admitting: Family Medicine

## 2017-05-26 NOTE — Telephone Encounter (Signed)
[facility] [place] [facility]

## 2017-05-27 ENCOUNTER — Other Ambulatory Visit: Payer: Self-pay | Admitting: Family Medicine

## 2017-05-27 DIAGNOSIS — I10 Essential (primary) hypertension: Secondary | ICD-10-CM

## 2017-05-30 ENCOUNTER — Other Ambulatory Visit: Payer: Self-pay | Admitting: Family Medicine

## 2017-05-30 ENCOUNTER — Ambulatory Visit (INDEPENDENT_AMBULATORY_CARE_PROVIDER_SITE_OTHER): Payer: PPO | Admitting: Emergency Medicine

## 2017-05-30 ENCOUNTER — Encounter: Payer: Self-pay | Admitting: Emergency Medicine

## 2017-05-30 VITALS — BP 130/94 | HR 80 | Temp 98.4°F | Resp 16 | Ht 62.5 in | Wt 232.2 lb

## 2017-05-30 DIAGNOSIS — I5032 Chronic diastolic (congestive) heart failure: Secondary | ICD-10-CM

## 2017-05-30 DIAGNOSIS — J029 Acute pharyngitis, unspecified: Secondary | ICD-10-CM | POA: Diagnosis not present

## 2017-05-30 DIAGNOSIS — R05 Cough: Secondary | ICD-10-CM

## 2017-05-30 DIAGNOSIS — J209 Acute bronchitis, unspecified: Secondary | ICD-10-CM | POA: Insufficient documentation

## 2017-05-30 DIAGNOSIS — R051 Acute cough: Secondary | ICD-10-CM | POA: Insufficient documentation

## 2017-05-30 DIAGNOSIS — R059 Cough, unspecified: Secondary | ICD-10-CM | POA: Insufficient documentation

## 2017-05-30 MED ORDER — BENZONATATE 200 MG PO CAPS: 200.0000 mg | ORAL_CAPSULE | Freq: Two times a day (BID) | ORAL | 0 refills | Status: DC | PRN

## 2017-05-30 MED ORDER — AZITHROMYCIN 250 MG PO TABS: ORAL_TABLET | ORAL | 0 refills | Status: DC

## 2017-05-30 NOTE — Patient Instructions (Addendum)
     IF you received an x-ray today, you will receive an invoice from [facility]. Please contact [facility] at [phone] with questions or concerns regarding your invoice.   IF you received labwork today, you will receive an invoice from [facility]. Please contact [facility] at [phone] with questions or concerns regarding your invoice.   Our billing staff will not be able to assist you with questions regarding bills from these companies.  You will be contacted with the lab results as soon as they are available. The fastest way to get your results is to activate your My Chart account. Instructions are located on the last page of this paperwork. If you have not heard from us regarding the results in 2 weeks, please contact this office.      Acute Bronchitis, Adult Acute bronchitis is when air tubes (bronchi) in the lungs suddenly get swollen. The condition can make it hard to breathe. It can also cause these symptoms:  A cough.  Coughing up clear, yellow, or green mucus.  Wheezing.  Chest congestion.  Shortness of breath.  A fever.  Body aches.  Chills.  A sore throat.  Follow these instructions at home: Medicines  Take over-the-counter and prescription medicines only as told by your doctor.  If you were prescribed an antibiotic medicine, take it as told by your doctor. Do not stop taking the antibiotic even if you start to feel better. General instructions  Rest.  Drink enough fluids to keep your pee (urine) clear or pale yellow.  Avoid smoking and secondhand smoke. If you smoke and you need help quitting, ask your doctor. Quitting will help your lungs heal faster.  Use an inhaler, cool mist vaporizer, or humidifier as told by your doctor.  Keep all follow-up visits as told by your doctor. This is important. How is this prevented? To lower your risk of getting this condition again:  Wash your hands often with soap and water. If you cannot  use soap and water, use hand sanitizer.  Avoid contact with people who have cold symptoms.  Try not to touch your hands to your mouth, nose, or eyes.  Make sure to get the flu shot every year.  Contact a doctor if:  Your symptoms do not get better in 2 weeks. Get help right away if:  You cough up blood.  You have chest pain.  You have very bad shortness of breath.  You become dehydrated.  You faint (pass out) or keep feeling like you are going to pass out.  You keep throwing up (vomiting).  You have a very bad headache.  Your fever or chills gets worse. This information is not intended to replace advice given to you by your health care provider. Make sure you discuss any questions you have with your health care provider. Document Released: 01/18/2008 Document Revised: 03/09/2016 Document Reviewed: 01/20/2016 Elsevier Interactive Patient Education  2017 Elsevier Inc.  

## 2017-05-30 NOTE — Progress Notes (Signed)
Selena Bush 71 y.o.   Chief Complaint  Patient presents with  . Cough    productive with yellowish green mucus - SXs started last night  . Sore Throat    HISTORY OF PRESENT ILLNESS: This is a 71 y.o. female complaining of URI symptoms x several days followed last night by severe productive cough, wheezing, and low grade fever.  Cough  This is a new problem. The current episode started yesterday. The problem has been gradually worsening. The problem occurs constantly. The cough is productive of sputum. Associated symptoms include a fever, a sore throat, shortness of breath and wheezing. Pertinent negatives include no chest pain, chills, eye redness, headaches, hemoptysis, nasal congestion or rash. She has tried nothing for the symptoms. OSA     Prior to Admission medications   Medication Sig Start Date End Date Taking? Authorizing Provider  atenolol (TENORMIN) 50 MG tablet Take 1 tablet (50 mg total) by mouth daily. 06/07/16  Yes Wardell Honour, MD  BELSOMRA 20 MG TABS TAKE 1 TABLET BY MOUTH AT BEDTIME 05/26/17  Yes Wardell Honour, MD  busPIRone (BUSPAR) 15 MG tablet Take 2 tablets (30 mg total) by mouth at bedtime. 06/07/16  Yes Wardell Honour, MD  clindamycin (CLEOCIN) 300 MG capsule TK 2 CS PO 1 HOUR PRIOR TO TREATMENT/PRN 02/23/15  Yes [provider]  colchicine 0.6 MG tablet Take 1 tablet (0.6 mg total) by mouth 2 (two) times daily. Patient taking differently: Take 0.6 mg by mouth as needed.  07/01/16  Yes Wardell Honour, MD  DULoxetine (CYMBALTA) 60 MG capsule Take 2 capsules (120 mg total) by mouth daily. 06/07/16  Yes Wardell Honour, MD  fluticasone [facility]) 50 MCG/ACT nasal spray Place 2 sprays into both nostrils daily as needed for rhinitis or allergies. 06/07/16  Yes Wardell Honour, MD  furosemide (LASIX) 20 MG tablet TAKE 1 TABLET BY MOUTH TWICE A DAY 11/28/16  Yes Wardell Honour, MD  lisinopril-hydrochlorothiazide (PRINZIDE,ZESTORETIC) 20-12.5 MG tablet  TAKE 1 TABLET BY MOUTH DAILY. 05/29/17  Yes Wardell Honour, MD  NEXIUM 40 MG capsule Take 40 mg by mouth every morning.  10/21/12  Yes [provider]  raloxifene (EVISTA) 60 MG tablet TAKE 1 TABLET BY MOUTH EVERY DAY 04/21/17  Yes Byrnett, Forest Gleason, MD  spironolactone (ALDACTONE) 25 MG tablet Take 1 tablet (25 mg total) by mouth daily. 06/07/16  Yes Wardell Honour, MD  tiZANidine (ZANAFLEX) 4 MG tablet TAKE 1 TABLET BY MOUTH AT SUPPER AND TAKE 1 TABLET BY MOUTH AT BEDTIME 05/02/17  Yes Wardell Honour, MD  azithromycin [facility]) 250 MG tablet Sig as indicated 05/30/17   Horald Pollen, MD  benzonatate (TESSALON) 200 MG capsule Take 1 capsule (200 mg total) by mouth 2 (two) times daily as needed for cough. 05/30/17   Horald Pollen, MD  ibuprofen (ADVIL,MOTRIN) 200 MG tablet Take by mouth 2 (two) times daily.     [provider]    Allergies  Allergen Reactions  . Asa [Aspirin]   . Codeine     Gi problems   . Penicillins     anaphylaxis    . Statins Other (See Comments)    Leg pains  . Sulfa Antibiotics     anaphylaxis     Patient Active Problem List   Diagnosis Date Noted  . Anxiety 03/21/2016  . Insomnia 07/17/2015  . Muscle cramp 05/25/2015  . Hearing loss 03/09/2015  . Hypokalemia 01/21/2015  .  MI (mitral incompetence) 12/10/2014  . Depression 12/09/2014  . Sleep apnea 12/09/2014  . GERD (gastroesophageal reflux disease) 12/09/2014  . Hyperglycemia 12/09/2014  . DCIS (ductal carcinoma in situ) of breast 11/19/2014  . Chronic diastolic CHF (congestive heart failure) ([place])   . HLD (hyperlipidemia)   . Osteoarthritis of both knees   . S/P left TKA 08/19/2014  . Morbid obesity ([place]) 04/17/2013  . Hyponatremia 04/17/2013  . DCIS (ductal carcinoma in situ) 11/05/2012  . Essential hypertension, benign 11/05/2012    Past Medical History:  Diagnosis Date  . Anxiety   . Chronic diastolic CHF (congestive heart failure) ([place])    a. echo  09/2014: EF 60-10%, diastolic dysfunction, mild LVH, nl RV size & systolic function, mildly dilated LA (4.3 cm), mild MR/TR, mildly elevated PASP 36.7 mm Hg  . DDD (degenerative disc disease), cervical   . DDD (degenerative disc disease), lumbar   . Depression   . Diffuse cystic mastopathy 2014  . GERD (gastroesophageal reflux disease)   . Headache    rare  . HLD (hyperlipidemia)    a. statin intolerant 2/2 myalgias  . Hypercholesterolemia   . Hypertension   . 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 DR/PR positive. PARTIAL RIGHT MASTECTOMY FOR BREAST CANCER--HAD RADIATION - NO CHEMO --DR. Benoit ONCOLOGIST  . Obesity   . OSA on CPAP   . Osteoarthritis of both knees    a. s/p right TKA 04/2013 & left TKA 09/2014  . Otitis externa   . Sleep difficulties    LUNESTA HAS HELPED  . Vaginal cyst     Past Surgical History:  Procedure Laterality Date  . ABDOMINAL HYSTERECTOMY  1992   DUB; fibroids; endometriosis.  One remaining ovary.    [name] BREAST SURGERY Right March 2014   Wide excision,APB RT 10 mm papillary DCIS, ER/PR positive. Sentinel node negative. Partial breast radiation.  [name] CATARACT EXTRACTION, BILATERAL  02/13/2016   [place].  . CHOLECYSTECTOMY  1994  . COLONOSCOPY  2015    1 benign polyp-every 5 years  . ERCP  1995  . JOINT REPLACEMENT Right Sept 2014   knee  . TOTAL KNEE ARTHROPLASTY Right 04/16/2013   Procedure: RIGHT TOTAL KNEE ARTHROPLASTY;  Surgeon: Mauri Pole, MD;  Location: [facility];  Service: Orthopedics;  Laterality: Right;  . TOTAL KNEE ARTHROPLASTY Left 09/15/2014   Procedure: LEFT TOTAL KNEE ARTHROPLASTY;  Surgeon: Mauri Pole, MD;  Location: [facility];  Service: Orthopedics;  Laterality: Left;  . TUBAL LIGATION  1979    Social History   Social History  . Marital status: Widowed    Spouse name: Chrissie Noa  . Number of children: 2  . Years of education: College    Occupational History  . Retired    Social History Main Topics  . Smoking status: Never Smoker  . Smokeless tobacco: Never Used     Comment: social smoker as a teen  . Alcohol use No  . Drug use: No  . Sexual activity: Not Currently   Other Topics Concern  . Not on file   Social History Narrative   Marital status: widowed since 09/16/2015      Children: 2 children (45, 41); 5 grandchild      Lives: alone in townhome      Employment: psychiatric Education officer, [employer]; retired in 2015      Tobacco: teenager only      Alcohol:  None  Drugs: none      Exercise: none in 2017.      ADLs: drives; independent with ADLs; no assistant devices      Advanced Directives: none; FULL CODE; no prolonged measures.      Family History  Problem Relation Age of Onset  . Colon cancer Mother   . Cancer Mother 17       colon cancer  . Melanoma Father   . Cancer Father 68       melanoma  . Colon cancer Maternal Grandfather   . Breast cancer Maternal Grandfather      Review of Systems  Constitutional: Positive for fever. Negative for chills.  HENT: Positive for sore throat.   Eyes: Negative for discharge and redness.  Respiratory: Positive for cough, sputum production, shortness of breath and wheezing. Negative for hemoptysis.   Cardiovascular: Negative for chest pain and palpitations.  Gastrointestinal: Negative for abdominal pain, diarrhea, nausea and vomiting.  Genitourinary: Negative for dysuria.  Skin: Negative for rash.  Neurological: Negative.  Negative for dizziness and headaches.  Endo/Heme/Allergies: Negative.   All other systems reviewed and are negative.  Vitals:   05/30/17 1542  BP: (!) 130/94  Pulse: 80  Resp: 16  Temp: 98.4 F (36.9 C)  SpO2: 94%     Physical Exam  Constitutional: She is oriented to person, place, and time. She appears well-developed and well-nourished.  HENT:  Head: Normocephalic and atraumatic.  Right Ear: External ear normal.  Left Ear:  External ear normal.  Mouth/Throat: Posterior oropharyngeal erythema present. No oropharyngeal exudate.  Eyes: Pupils are equal, round, and reactive to light. Conjunctivae and EOM are normal.  Neck: Normal range of motion. Neck supple. No JVD present.  Cardiovascular: Normal rate, regular rhythm and normal heart sounds.   Pulmonary/Chest: Effort normal and breath sounds normal.  Musculoskeletal: Normal range of motion.  Lymphadenopathy:    She has no cervical adenopathy.  Neurological: She is alert and oriented to person, place, and time. No sensory deficit. She exhibits normal muscle tone.  Skin: Skin is warm and dry. Capillary refill takes less than 2 seconds. No rash noted.  Psychiatric: She has a normal mood and affect. Her behavior is normal.  Vitals reviewed.    ASSESSMENT & PLAN: Rasheen was seen today for cough and sore throat.  Diagnoses and all orders for this visit:  Acute bronchitis, unspecified organism  Cough -     azithromycin (ZITHROMAX) 250 MG tablet; Sig as indicated -     benzonatate (TESSALON) 200 MG capsule; Take 1 capsule (200 mg total) by mouth 2 (two) times daily as needed for cough.  Sore throat    Patient Instructions       IF you received an x-ray today, you will receive an invoice from [facility]. Please contact [facility] at [phone] with questions or concerns regarding your invoice.   IF you received labwork today, you will receive an invoice from [facility]. Please contact [facility] at [phone] with questions or concerns regarding your invoice.   Our billing staff will not be able to assist you with questions regarding bills from these companies.  You will be contacted with the lab results as soon as they are available. The fastest way to get your results is to activate your My Chart account. Instructions are located on the last page of this paperwork. If you have not heard from [place] regarding the results in 2 weeks,  please contact this office.     Acute  Bronchitis, Adult Acute bronchitis is when air tubes (bronchi) in the lungs suddenly get swollen. The condition can make it hard to breathe. It can also cause these symptoms:  A cough.  Coughing up clear, yellow, or green mucus.  Wheezing.  Chest congestion.  Shortness of breath.  A fever.  Body aches.  Chills.  A sore throat.  Follow these instructions at home: Medicines  Take over-the-counter and prescription medicines only as told by your doctor.  If you were prescribed an antibiotic medicine, take it as told by your doctor. Do not stop taking the antibiotic even if you start to feel better. General instructions  Rest.  Drink enough fluids to keep your pee (urine) clear or pale yellow.  Avoid smoking and secondhand smoke. If you smoke and you need help quitting, ask your doctor. Quitting will help your lungs heal faster.  Use an inhaler, cool mist vaporizer, or humidifier as told by your doctor.  Keep all follow-up visits as told by your doctor. This is important. How is this prevented? To lower your risk of getting this condition again:  Wash your hands often with soap and water. If you cannot use soap and water, use hand sanitizer.  Avoid contact with people who have cold symptoms.  Try not to touch your hands to your mouth, nose, or eyes.  Make sure to get the flu shot every year.  Contact a doctor if:  Your symptoms do not get better in 2 weeks. Get help right away if:  You cough up blood.  You have chest pain.  You have very bad shortness of breath.  You become dehydrated.  You faint (pass out) or keep feeling like you are going to pass out.  You keep throwing up (vomiting).  You have a very bad headache.  Your fever or chills gets worse. This information is not intended to replace advice given to you by your health care provider. Make sure you discuss any questions you have with your health care  provider. Document Released: 01/18/2008 Document Revised: 03/09/2016 Document Reviewed: 01/20/2016 Elsevier Interactive Patient Education  2017 Elsevier Inc.      Agustina Caroli, MD [facility]

## 2017-06-01 NOTE — Telephone Encounter (Signed)
Rx at pharmacy

## 2017-06-03 ENCOUNTER — Telehealth: Payer: Self-pay | Admitting: Emergency Medicine

## 2017-06-03 NOTE — Telephone Encounter (Signed)
Pt stated that Dr. Mitchel Honour told her to call back to tell him if she is feeling better and pt stated she is feeling a lot better.

## 2017-06-19 ENCOUNTER — Other Ambulatory Visit: Payer: Self-pay | Admitting: Family Medicine

## 2017-06-28 NOTE — Telephone Encounter (Signed)
I would recommend we get back on a regular schedule with a diagnostic bilateral mammogram in February 2019.

## 2017-06-29 ENCOUNTER — Encounter: Payer: Self-pay | Admitting: Family Medicine

## 2017-07-04 ENCOUNTER — Telehealth: Payer: Self-pay | Admitting: Family Medicine

## 2017-07-04 DIAGNOSIS — F329 Major depressive disorder, single episode, unspecified: Secondary | ICD-10-CM

## 2017-07-11 DIAGNOSIS — L219 Seborrheic dermatitis, unspecified: Secondary | ICD-10-CM | POA: Diagnosis not present

## 2017-07-12 ENCOUNTER — Other Ambulatory Visit: Payer: Self-pay

## 2017-07-12 DIAGNOSIS — F329 Major depressive disorder, single episode, unspecified: Secondary | ICD-10-CM

## 2017-07-12 MED ORDER — BUSPIRONE HCL 30 MG PO TABS: 30.0000 mg | ORAL_TABLET | Freq: Every day | ORAL | 0 refills | Status: DC

## 2017-07-12 NOTE — Telephone Encounter (Signed)
Copied from [place] [id number]. Topic: Quick Communication - See Telephone Encounter >> Jul 12, 2017  9:46 AM Ahmed Prima L wrote: CRM for notification. See Telephone encounter for:  Vicente Males from [facility] called & said that the script for BUSPIRONE for 15 mg that has a 30 day supply left but the 15mg  is on back order, she is wanting to know if she can get a script for the 30mg  that they currently have in stock. [phone]  07/12/17.

## 2017-07-12 NOTE — Telephone Encounter (Signed)
Medication reordered.

## 2017-07-12 NOTE — Telephone Encounter (Signed)
Medication   Change  Request

## 2017-07-20 ENCOUNTER — Telehealth: Payer: Self-pay | Admitting: Family Medicine

## 2017-07-20 DIAGNOSIS — G4733 Obstructive sleep apnea (adult) (pediatric): Secondary | ICD-10-CM

## 2017-07-20 NOTE — Telephone Encounter (Signed)
Copied from [place] [id number]. Topic: Referral - Request >> Jul 20, 2017  2:37 PM Yvette Rack wrote: Reason for CRM: patient calling stating that when she went to get sleep study done in July they stated that they wanted pt to have another sleepy study test because she didn't sleep long enough the sleep test was done in [place] the place stated that Dr Tamala Julian was going to call her to redo another referral but pt states  that its Dec and still haven't heard from anyone  -----------------------------------------------------------------------------------------  Please advise. Will be happy to send referral if placed. Thanks!

## 2017-07-20 NOTE — Telephone Encounter (Signed)
Copied from [place] [id number]. Topic: Quick Communication - See Telephone Encounter >> Jul 20, 2017  2:48 PM Yvette Rack wrote: Patient calling stating that the Buspar 30mg  [facility] [place] they sent several request and never heard from Dr Tamala Julian

## 2017-07-20 NOTE — Telephone Encounter (Signed)
Phone call to patient. Left message stating this RN is calling to discuss Buspirone refill, please call back to discuss.

## 2017-07-21 NOTE — Telephone Encounter (Signed)
Copied from [place] [phone]. Topic: Quick Communication - Rx Refill/Question >> Jul 21, 2017 11:25 AM Marin Olp L wrote: Has the patient contacted their pharmacy? Yes.   (Agent: If no, request that the patient contact the pharmacy for the refill.) Preferred Pharmacy (with phone number or street name): [id number] [place], [place]: Please be advised that RX refills may take up to 3 business days. We ask that you follow-up with your pharmacy. busPIRone (BUSPAR) 30 MG tablet was sent to wrong pharm. Patient says the mg she needs is currently on back order. Requesting to get a lesser size equivalent to The amount she needs to take in the day, possibly 10mg  instead. Please call pharm to clarify.

## 2017-07-21 NOTE — Telephone Encounter (Signed)
Pt has an upcoming OV 08/30/17. Pt requesting Buspar

## 2017-07-25 ENCOUNTER — Other Ambulatory Visit: Payer: Self-pay | Admitting: *Deleted

## 2017-07-25 DIAGNOSIS — F329 Major depressive disorder, single episode, unspecified: Secondary | ICD-10-CM

## 2017-07-25 MED ORDER — BUSPIRONE HCL 30 MG PO TABS: 30.0000 mg | ORAL_TABLET | Freq: Every day | ORAL | 0 refills | Status: DC

## 2017-07-25 NOTE — Telephone Encounter (Signed)
Please call [place] for clarification.  Does patient need a new referral?  New order placed.

## 2017-07-26 NOTE — Telephone Encounter (Signed)
Sent pt's referral to [facility] sleep center 12/12

## 2017-07-27 ENCOUNTER — Other Ambulatory Visit: Payer: Self-pay

## 2017-07-27 DIAGNOSIS — D0511 Intraductal carcinoma in situ of right breast: Secondary | ICD-10-CM

## 2017-07-28 ENCOUNTER — Other Ambulatory Visit: Payer: Self-pay | Admitting: Family Medicine

## 2017-07-28 DIAGNOSIS — I10 Essential (primary) hypertension: Secondary | ICD-10-CM

## 2017-08-16 ENCOUNTER — Telehealth: Payer: Self-pay | Admitting: Family Medicine

## 2017-08-16 NOTE — Telephone Encounter (Signed)
Copied from [place] [phone]. Topic: Inquiry >> Aug 16, 2017  1:42 PM Selena Bush, [place] wrote: Reason for CRM: Pt ask if Doctor Tamala Julian can Call her [phone]

## 2017-08-17 NOTE — Telephone Encounter (Signed)
Dr. Tamala Julian I called patient to get more detailed message and she did not want to give any states it would violate her privacy.

## 2017-08-23 ENCOUNTER — Other Ambulatory Visit: Payer: Self-pay | Admitting: Family Medicine

## 2017-08-23 DIAGNOSIS — F329 Major depressive disorder, single episode, unspecified: Secondary | ICD-10-CM

## 2017-08-28 ENCOUNTER — Telehealth: Payer: Self-pay | Admitting: Family Medicine

## 2017-08-28 NOTE — Telephone Encounter (Signed)
Copied from [place]. Topic: Quick Communication - See Telephone Encounter >> Aug 28, 2017 12:33 PM Bea Graff, NT wrote: CRM for notification. See Telephone encounter for: Pt calling to speak with Reginia Forts regarding her sleep study. She needs a new order for her sleep study. She states one was sent but not filled out completely. They need to know what type of sleep study. Pt called [facility] and they are waiting to hear from Dr. Tamala Julian. Pt states this has been going on since July and she it upset.   08/28/17.

## 2017-08-30 ENCOUNTER — Encounter: Payer: PPO | Admitting: Family Medicine

## 2017-09-15 NOTE — Telephone Encounter (Signed)
Left message on voicemail.  Has appointment 09/18/17.

## 2017-09-18 ENCOUNTER — Encounter: Payer: Self-pay | Admitting: Family Medicine

## 2017-09-18 ENCOUNTER — Ambulatory Visit (INDEPENDENT_AMBULATORY_CARE_PROVIDER_SITE_OTHER): Payer: PPO | Admitting: Family Medicine

## 2017-09-18 ENCOUNTER — Other Ambulatory Visit: Payer: Self-pay

## 2017-09-18 VITALS — BP 138/82 | HR 96 | Temp 98.0°F | Resp 16 | Ht 62.21 in | Wt 223.0 lb

## 2017-09-18 DIAGNOSIS — K219 Gastro-esophageal reflux disease without esophagitis: Secondary | ICD-10-CM | POA: Diagnosis not present

## 2017-09-18 DIAGNOSIS — M170 Bilateral primary osteoarthritis of knee: Secondary | ICD-10-CM | POA: Diagnosis not present

## 2017-09-18 DIAGNOSIS — D0511 Intraductal carcinoma in situ of right breast: Secondary | ICD-10-CM | POA: Diagnosis not present

## 2017-09-18 DIAGNOSIS — F419 Anxiety disorder, unspecified: Secondary | ICD-10-CM

## 2017-09-18 DIAGNOSIS — G4733 Obstructive sleep apnea (adult) (pediatric): Secondary | ICD-10-CM | POA: Diagnosis not present

## 2017-09-18 DIAGNOSIS — I10 Essential (primary) hypertension: Secondary | ICD-10-CM | POA: Diagnosis not present

## 2017-09-18 DIAGNOSIS — F329 Major depressive disorder, single episode, unspecified: Secondary | ICD-10-CM

## 2017-09-18 DIAGNOSIS — F5104 Psychophysiologic insomnia: Secondary | ICD-10-CM

## 2017-09-18 DIAGNOSIS — Z9989 Dependence on other enabling machines and devices: Secondary | ICD-10-CM | POA: Diagnosis not present

## 2017-09-18 NOTE — Patient Instructions (Signed)
     IF you received an x-ray today, you will receive an invoice from [facility]. Please contact [facility] at [phone] with questions or concerns regarding your invoice.   IF you received labwork today, you will receive an invoice from [facility]. Please contact [facility] at [phone] with questions or concerns regarding your invoice.   Our billing staff will not be able to assist you with questions regarding bills from these companies.  You will be contacted with the lab results as soon as they are available. The fastest way to get your results is to activate your My Chart account. Instructions are located on the last page of this paperwork. If you have not heard from us regarding the results in 2 weeks, please contact this office.     
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
## 2017-09-19 ENCOUNTER — Telehealth: Payer: Self-pay | Admitting: Family Medicine

## 2017-09-19 ENCOUNTER — Other Ambulatory Visit: Payer: Self-pay | Admitting: Family Medicine

## 2017-09-19 DIAGNOSIS — I5032 Chronic diastolic (congestive) heart failure: Secondary | ICD-10-CM

## 2017-09-19 NOTE — Telephone Encounter (Signed)
Was just seen yesterday and has follow up on 10/09/17 Please advise

## 2017-09-19 NOTE — Telephone Encounter (Signed)
Copied from [place]. Topic: Quick Communication - Rx Refill/Question >> Sep 19, 2017  2:11 PM Bea Graff, [place] wrote: Medication: spironolactone (ALDACTONE)   Has the patient contacted their pharmacy? Yes.     (Agent: If no, request that the patient contact the pharmacy for the refill.)   [facility] [address]   Agent: Please be advised that RX refills may take up to 3 business days. We ask that you follow-up with your pharmacy.

## 2017-09-21 ENCOUNTER — Other Ambulatory Visit: Payer: Self-pay | Admitting: Family Medicine

## 2017-09-25 MED ORDER — SPIRONOLACTONE 25 MG PO TABS: 25.0000 mg | ORAL_TABLET | Freq: Every day | ORAL | 0 refills | Status: DC

## 2017-09-26 ENCOUNTER — Other Ambulatory Visit: Payer: PPO

## 2017-09-27 NOTE — Telephone Encounter (Signed)
This has bee  Handled.

## 2017-10-02 ENCOUNTER — Ambulatory Visit (INDEPENDENT_AMBULATORY_CARE_PROVIDER_SITE_OTHER): Payer: PPO

## 2017-10-02 VITALS — BP 106/78 | HR 75 | Ht 62.0 in | Wt 223.0 lb

## 2017-10-02 DIAGNOSIS — Z0000 Encounter for general adult medical examination without abnormal findings: Secondary | ICD-10-CM

## 2017-10-02 DIAGNOSIS — I10 Essential (primary) hypertension: Secondary | ICD-10-CM

## 2017-10-02 DIAGNOSIS — E785 Hyperlipidemia, unspecified: Secondary | ICD-10-CM

## 2017-10-02 LAB — CBC WITH DIFFERENTIAL/PLATELET
BASOS: 1 %
Basophils Absolute: 0 10*3/uL (ref 0.0–0.2)
EOS (ABSOLUTE): 0.2 10*3/uL (ref 0.0–0.4)
EOS: 3 %
HEMATOCRIT: 36 % (ref 34.0–46.6)
HEMOGLOBIN: 11.7 g/dL (ref 11.1–15.9)
Immature Grans (Abs): 0 10*3/uL (ref 0.0–0.1)
Immature Granulocytes: 0 %
LYMPHS ABS: 1.3 10*3/uL (ref 0.7–3.1)
Lymphs: 19 %
MCH: 27.3 pg (ref 26.6–33.0)
MCHC: 32.5 g/dL (ref 31.5–35.7)
MCV: 84 fL (ref 79–97)
MONOCYTES: 9 %
MONOS ABS: 0.6 10*3/uL (ref 0.1–0.9)
NEUTROS ABS: 5 10*3/uL (ref 1.4–7.0)
Neutrophils: 68 %
Platelets: 300 10*3/uL (ref 150–379)
RBC: 4.28 x10E6/uL (ref 3.77–5.28)
RDW: 15.6 % — AB (ref 12.3–15.4)
WBC: 7.2 10*3/uL (ref 3.4–10.8)

## 2017-10-02 LAB — COMPREHENSIVE METABOLIC PANEL
ALBUMIN: 4.2 g/dL (ref 3.6–4.8)
ALK PHOS: 82 IU/L (ref 39–117)
ALT: 11 IU/L (ref 0–32)
AST: 12 IU/L (ref 0–40)
Albumin/Globulin Ratio: 1.6 (ref 1.2–2.2)
BUN / CREAT RATIO: 15 (ref 12–28)
BUN: 17 mg/dL (ref 8–27)
Bilirubin Total: 0.3 mg/dL (ref 0.0–1.2)
CO2: 26 mmol/L (ref 20–29)
CREATININE: 1.17 mg/dL — AB (ref 0.57–1.00)
Calcium: 9.8 mg/dL (ref 8.7–10.3)
Chloride: 93 mmol/L — ABNORMAL LOW (ref 96–106)
GFR calc non Af Amer: 48 mL/min/{1.73_m2} — ABNORMAL LOW (ref >59)
GFR, EST AFRICAN AMERICAN: 55 mL/min/{1.73_m2} — AB (ref >59)
GLOBULIN, TOTAL: 2.6 g/dL (ref 1.5–4.5)
GLUCOSE: 91 mg/dL (ref 65–99)
Potassium: 4.6 mmol/L (ref 3.5–5.2)
SODIUM: 133 mmol/L — AB (ref 134–144)
Total Protein: 6.8 g/dL (ref 6.0–8.5)

## 2017-10-02 LAB — LIPID PANEL
Chol/HDL Ratio: 3.7 ratio (ref 0.0–4.4)
Cholesterol, Total: 221 mg/dL — ABNORMAL HIGH (ref 100–199)
HDL: 60 mg/dL (ref >39)
LDL Calculated: 132 mg/dL — ABNORMAL HIGH (ref 0–99)
Triglycerides: 143 mg/dL (ref 0–149)
VLDL CHOLESTEROL CAL: 29 mg/dL (ref 5–40)

## 2017-10-02 NOTE — Progress Notes (Signed)
Subjective:   Selena Bush is a 72 y.o. female who presents for Medicare Annual (Subsequent) preventive examination.  Review of Systems:  N/A Cardiac Risk Factors include: advanced age (>5men, >54 women);obesity (BMI >30kg/m2);dyslipidemia;hypertension     Objective:     Vitals: BP 106/78   Pulse 75   Ht 5\' 2"  (1.575 m)   Wt 223 lb (101.2 kg)   SpO2 95%   BMI 40.79 kg/m   Body mass index is 40.79 kg/m.  Advanced Directives 10/02/2017 09/13/2016 08/10/2016 05/05/2015 03/16/2015 03/16/2015 09/15/2014  Does Patient Have a Medical Advance Directive? No No No No No No No  Would patient like information on creating a medical advance directive? No - Patient declined No - Patient declined No - Patient declined - Yes - Educational materials given - No - patient declined information  Pre-existing out of facility DNR order (yellow form or pink MOST form) - - - - - - -    Tobacco Social History   Tobacco Use  Smoking Status Never Smoker  Smokeless Tobacco Never Used  Tobacco Comment   social smoker as a teen     Counseling given: Not Answered Comment: social smoker as a teen   Clinical Intake:  Pre-visit preparation completed: Yes  Pain : No/denies pain     Nutritional Status: BMI > 30  Obese Nutritional Risks: None Diabetes: No  How often do you need to have someone help you when you read instructions, pamphlets, or other written materials from your doctor or pharmacy?: 1 - Never What is the last grade level you completed in school?: Bachelors degree  Interpreter Needed?: No  Information entered by :: Andrez Grime, LPN  Past Medical History:  Diagnosis Date  . Anxiety   . Chronic diastolic CHF (congestive heart failure) ([place])    a. echo 09/2014: EF 00-86%, diastolic dysfunction, mild LVH, nl RV size & systolic function, mildly dilated LA (4.3 cm), mild MR/TR, mildly elevated PASP 36.7 mm Hg  . DDD (degenerative disc disease), cervical   . DDD (degenerative disc  disease), lumbar   . Depression   . Diffuse cystic mastopathy 2014  . GERD (gastroesophageal reflux disease)   . Headache    rare  . HLD (hyperlipidemia)    a. statin intolerant 2/2 myalgias  . Hypercholesterolemia   . Hypertension   . Malignant neoplasm of upper-outer quadrant of female breast ([place]) 10/2012   Papillary DCIS, sentinel node negative. DR/PR positive. PARTIAL RIGHT MASTECTOMY FOR BREAST CANCER--HAD RADIATION - NO CHEMO --DR. Eldridge ONCOLOGIST  . Obesity   . OSA on CPAP   . Osteoarthritis of both knees    a. s/p right TKA 04/2013 & left TKA 09/2014  . Otitis externa   . Sleep difficulties    LUNESTA HAS HELPED  . Vaginal cyst    Past Surgical History:  Procedure Laterality Date  . ABDOMINAL HYSTERECTOMY  1992   DUB; fibroids; endometriosis.  One remaining ovary.    [name] BREAST SURGERY Right March 2014   Wide excision,APB RT 10 mm papillary DCIS, ER/PR positive. Sentinel node negative. Partial breast radiation.  [name] CATARACT EXTRACTION, BILATERAL  02/13/2016   [facility].  . CHOLECYSTECTOMY  1994  . COLONOSCOPY  2015    1 benign polyp-every 5 years  . ERCP  1995  . JOINT REPLACEMENT Right Sept 2014   knee  . TOTAL KNEE ARTHROPLASTY Right 04/16/2013   Procedure: RIGHT TOTAL KNEE ARTHROPLASTY;  Surgeon: Mauri Pole, MD;  Location:  [facility];  Service: Orthopedics;  Laterality: Right;  . TOTAL KNEE ARTHROPLASTY Left 09/15/2014   Procedure: LEFT TOTAL KNEE ARTHROPLASTY;  Surgeon: Mauri Pole, MD;  Location: [facility];  Service: Orthopedics;  Laterality: Left;  . TUBAL LIGATION  1979   Family History  Problem Relation Age of Onset  . Colon cancer Mother   . Cancer Mother 44       colon cancer  . Melanoma Father   . Cancer Father 25       melanoma  . Colon cancer Maternal Grandfather   . Breast cancer Maternal Grandfather    Social History   Socioeconomic History  . Marital status: Widowed    Spouse name: Chrissie Noa  . Number of children: 2  . Years of  education: College  . Highest education level: Bachelor's degree (e.g., BA, AB, BS)  Social Needs  . Financial resource strain: Not hard at all  . Food insecurity - worry: Never true  . Food insecurity - inability: Never true  . Transportation needs - medical: No  . Transportation needs - non-medical: No  Occupational History  . Occupation: Retired  Tobacco Use  . Smoking status: Never Smoker  . Smokeless tobacco: Never Used  . Tobacco comment: social smoker as a teen  Substance and Sexual Activity  . Alcohol use: No  . Drug use: No  . Sexual activity: Not Currently  Other Topics Concern  . None  Social History Narrative   Marital status: widowed since 09/16/2015      Children: 2 children (45, 67); 5 grandchild      Lives: alone in townhome      Employment: psychiatric Education officer, museum; retired in 2015      Tobacco: teenager only      Alcohol:  None      Drugs: none      Exercise: none in 2017.      ADLs: drives; independent with ADLs; no assistant devices      Advanced Directives: none; FULL CODE; no prolonged measures.      Outpatient Encounter Medications as of 10/02/2017  Medication Sig  . atenolol (TENORMIN) 50 MG tablet TAKE 1 TABLET (50 MG TOTAL) BY MOUTH DAILY.  . BELSOMRA 20 MG TABS TAKE 1 TABLET BY MOUTH AT BEDTIME  . clindamycin (CLEOCIN) 300 MG capsule TK 2 CS PO 1 HOUR PRIOR TO TREATMENT/PRN  . colchicine 0.6 MG tablet Take 1 tablet (0.6 mg total) by mouth 2 (two) times daily. (Patient taking differently: Take 0.6 mg by mouth as needed. )  . DULoxetine (CYMBALTA) 60 MG capsule Take 2 capsules (120 mg total) by mouth daily.  . fluticasone (FLONASE) 50 MCG/ACT nasal spray PLACE 2 SPRAYS INTO BOTH NOSTRILS DAILY AS NEEDED FOR RHINITIS OR ALLERGIES.  . furosemide (LASIX) 20 MG tablet TAKE 1 TABLET BY MOUTH TWICE A DAY (Patient taking differently: TAKE 1 TABLET BY MOUTH ONCE A DAY)  . lisinopril-hydrochlorothiazide (PRINZIDE,ZESTORETIC) 20-12.5 MG tablet TAKE 1 TABLET BY  MOUTH DAILY.  [name] NEXIUM 40 MG capsule Take 40 mg by mouth every morning.   . raloxifene (EVISTA) 60 MG tablet TAKE 1 TABLET BY MOUTH EVERY DAY  . spironolactone (ALDACTONE) 25 MG tablet Take 1 tablet (25 mg total) by mouth daily.  [name] tiZANidine (ZANAFLEX) 4 MG tablet TAKE 1 TABLET BY MOUTH AT SUPPER AND TAKE 1 TABLET BY MOUTH AT BEDTIME   No facility-administered encounter medications on file as of 10/02/2017.     Activities of Daily Living  In your present state of health, do you have any difficulty performing the following activities: 10/02/2017  Hearing? N  Vision? N  Difficulty concentrating or making decisions? Y  Comment Patient has issues with short term memory  Walking or climbing stairs? Y  Comment Patient has knee issues  Dressing or bathing? N  Doing errands, shopping? N  Preparing Food and eating ? N  Using the Toilet? N  In the past six months, have you accidently leaked urine? Y  Comment Patient has some urine leakage at times  Do you have problems with loss of bowel control? N  Managing your Medications? N  Managing your Finances? N  Housekeeping or managing your Housekeeping? N  Some recent data might be hidden    Patient Care Team: Wardell Honour, MD as PCP - General (Family Medicine) Bary Castilla Forest Gleason, MD (General Surgery) Corey Harold, MD as Consulting Physician    Assessment:   This is a routine wellness examination for [place].  Exercise Activities and Dietary recommendations Current Exercise Habits: Home exercise routine, Type of exercise: walking, Time (Minutes): 15, Frequency (Times/Week): 7, Weekly Exercise (Minutes/Week): 105, Intensity: Mild, Exercise limited by: None identified  Goals    . DIET - REDUCE PORTION SIZE     Patient is currently reducing her portion size to help in losing some weight.     . Exercise 150 minutes per week (moderate activity)       Fall Risk Fall Risk  10/02/2017 09/18/2017 09/18/2017 05/30/2017 02/21/2017  Falls in the  past year? Yes No No Yes Yes  Number falls in past yr: 1 - - 2 or more 2 or more  Injury with Fall? No - - No No  Risk for fall due to : Other (Comment) - - - -  Risk for fall due to: Comment slipped and fell on a object  - - - -  Follow up Falls prevention discussed - - - -   Is the patient's home free of loose throw rugs in walkways, pet beds, electrical cords, etc?   yes      Grab bars in the bathroom? no      Handrails on the stairs?   no      Adequate lighting?   yes  Timed Get Up and Go performed: yes, completed within 30 seconds   Depression Screen PHQ 2/9 Scores 10/02/2017 09/18/2017 09/18/2017 05/30/2017  PHQ - 2 Score 0 0 0 0  PHQ- 9 Score 1 - - -     Cognitive Function     6CIT Screen 10/02/2017  What Year? 0 points  What month? 0 points  What time? 0 points  Count back from 20 0 points  Months in reverse 0 points  Repeat phrase 0 points  Total Score 0    Immunization History  Administered Date(s) Administered  . Influenza, High Dose Seasonal PF 05/05/2015  . Influenza,inj,Quad PF,6+ Mos 06/07/2016  . Pneumococcal Conjugate-13 02/06/2016  . Pneumococcal Polysaccharide-23 09/22/2014    Qualifies for Shingles Vaccine?Advised patient to check with pharmacy about receiving the Shingrix vaccine  Screening Tests Health Maintenance  Topic Date Due  . INFLUENZA VACCINE  11/12/2017 (Originally 03/15/2017)  . DEXA SCAN  12/12/2017 (Originally 05/15/2013)  . TETANUS/TDAP  10/02/2018 (Originally 05/16/1967)  . MAMMOGRAM  04/20/2019  . COLONOSCOPY  07/24/2024  . Hepatitis C Screening  Completed  . PNA vac Low Risk Adult  Completed    Cancer Screenings: Lung: Low Dose CT Chest recommended  if Age 67-80 years, 36 pack-year currently smoking OR have quit w/in 15years. Patient does not qualify. Breast:  Up to date on Mammogram? Yes   Up to date of Bone Density/Dexa? Patient declined Colorectal: colonoscopy completed 07/24/2014  Additional Screenings:  Hepatitis  B/HIV/Syphillis: not indicated  Hepatitis C Screening: completed 06/07/2016    Patient declined flu vaccine  Plan:   I have personally reviewed and noted the following in the patient's chart:   . Medical and social history . Use of alcohol, tobacco or illicit drugs  . Current medications and supplements . Functional ability and status . Nutritional status . Physical activity . Advanced directives . List of other physicians . Hospitalizations, surgeries, and ER visits in previous 12 months . Vitals . Screenings to include cognitive, depression, and falls . Referrals and appointments  In addition, I have reviewed and discussed with patient certain preventive protocols, quality metrics, and best practice recommendations. A written personalized care plan for preventive services as well as general preventive health recommendations were provided to patient.   1. Hyperlipidemia, unspecified hyperlipidemia type - Lipid panel  2. Essential hypertension - Comprehensive metabolic panel - CBC with Differential/Platelet  3. Encounter for Medicare annual wellness exam  Andrez Grime, LPN  [id number]/[id number]

## 2017-10-02 NOTE — Patient Instructions (Addendum)
Selena Bush , Thank you for taking time to come for your Medicare Wellness Visit. I appreciate your ongoing commitment to your health goals. Please review the following plan we discussed and let me know if I can assist you in the future.   Screening recommendations/referrals: Colonoscopy: up to date, next due 07/24/2024 Mammogram: up to date, next due 07/28/2019 Bone Density: declined  Recommended yearly ophthalmology/optometry visit for glaucoma screening and checkup Recommended yearly dental visit for hygiene and checkup  Vaccinations: Influenza vaccine: declined  Pneumococcal vaccine: up to date  Tdap vaccine: declined due to insurance  Shingles vaccine: Check with your pharmacy about receiving the Shingrix vaccine    Advanced directives: Advance directive discussed with you today. Even though you declined this today please call our office should you change your mind and we can give you the proper paperwork for you to fill out.  Conditions/risks identified: continue reducing your portion size to help in losing some weight.   Next appointment: 10/09/17 @ 4:40 pm with Dr. Tamala Julian, next AWV 1 year    Preventive Care 58 Years and Older, Female Preventive care refers to lifestyle choices and visits with your health care provider that can promote health and wellness. What does preventive care include?  A yearly physical exam. This is also called an annual well check.  Dental exams once or twice a year.  Routine eye exams. Ask your health care provider how often you should have your eyes checked.  Personal lifestyle choices, including:  Daily care of your teeth and gums.  Regular physical activity.  Eating a healthy diet.  Avoiding tobacco and drug use.  Limiting alcohol use.  Practicing safe sex.  Taking low-dose aspirin every day.  Taking vitamin and mineral supplements as recommended by your health care provider. What happens during an annual well check? The services and  screenings done by your health care provider during your annual well check will depend on your age, overall health, lifestyle risk factors, and family history of disease. Counseling  Your health care provider may ask you questions about your:  Alcohol use.  Tobacco use.  Drug use.  Emotional well-being.  Home and relationship well-being.  Sexual activity.  Eating habits.  History of falls.  Memory and ability to understand (cognition).  Work and work Statistician.  Reproductive health. Screening  You may have the following tests or measurements:  Height, weight, and BMI.  Blood pressure.  Lipid and cholesterol levels. These may be checked every 5 years, or more frequently if you are over 15 years old.  Skin check.  Lung cancer screening. You may have this screening every year starting at age 70 if you have a 30-pack-year history of smoking and currently smoke or have quit within the past 15 years.  Fecal occult blood test (FOBT) of the stool. You may have this test every year starting at age 68.  Flexible sigmoidoscopy or colonoscopy. You may have a sigmoidoscopy every 5 years or a colonoscopy every 10 years starting at age 24.  Hepatitis C blood test.  Hepatitis B blood test.  Sexually transmitted disease (STD) testing.  Diabetes screening. This is done by checking your blood sugar (glucose) after you have not eaten for a while (fasting). You may have this done every 1-3 years.  Bone density scan. This is done to screen for osteoporosis. You may have this done starting at age 42.  Mammogram. This may be done every 1-2 years. Talk to your health care provider about  how often you should have regular mammograms. Talk with your health care provider about your test results, treatment options, and if necessary, the need for more tests. Vaccines  Your health care provider may recommend certain vaccines, such as:  Influenza vaccine. This is recommended every  year.  Tetanus, diphtheria, and acellular pertussis (Tdap, Td) vaccine. You may need a Td booster every 10 years.  Zoster vaccine. You may need this after age 34.  Pneumococcal 13-valent conjugate (PCV13) vaccine. One dose is recommended after age 106.  Pneumococcal polysaccharide (PPSV23) vaccine. One dose is recommended after age 33. Talk to your health care provider about which screenings and vaccines you need and how often you need them. This information is not intended to replace advice given to you by your health care provider. Make sure you discuss any questions you have with your health care provider. Document Released: 08/28/2015 Document Revised: 04/20/2016 Document Reviewed: 06/02/2015 Elsevier Interactive Patient Education  2017 [employer] Prevention in the Home Falls can cause injuries. They can happen to people of all ages. There are many things you can do to make your home safe and to help prevent falls. What can I do on the outside of my home?  Regularly fix the edges of walkways and driveways and fix any cracks.  Remove anything that might make you trip as you walk through a door, such as a raised step or threshold.  Trim any bushes or trees on the path to your home.  Use bright outdoor lighting.  Clear any walking paths of anything that might make someone trip, such as rocks or tools.  Regularly check to see if handrails are loose or broken. Make sure that both sides of any steps have handrails.  Any raised decks and porches should have guardrails on the edges.  Have any leaves, snow, or ice cleared regularly.  Use sand or salt on walking paths during winter.  Clean up any spills in your garage right away. This includes oil or grease spills. What can I do in the bathroom?  Use night lights.  Install grab bars by the toilet and in the tub and shower. Do not use towel bars as grab bars.  Use non-skid mats or decals in the tub or shower.  If you  need to sit down in the shower, use a plastic, non-slip stool.  Keep the floor dry. Clean up any water that spills on the floor as soon as it happens.  Remove soap buildup in the tub or shower regularly.  Attach bath mats securely with double-sided non-slip rug tape.  Do not have throw rugs and other things on the floor that can make you trip. What can I do in the bedroom?  Use night lights.  Make sure that you have a light by your bed that is easy to reach.  Do not use any sheets or blankets that are too big for your bed. They should not hang down onto the floor.  Have a firm chair that has side arms. You can use this for support while you get dressed.  Do not have throw rugs and other things on the floor that can make you trip. What can I do in the kitchen?  Clean up any spills right away.  Avoid walking on wet floors.  Keep items that you use a lot in easy-to-reach places.  If you need to reach something above you, use a strong step stool that has a grab bar.  Keep  electrical cords out of the way.  Do not use floor polish or wax that makes floors slippery. If you must use wax, use non-skid floor wax.  Do not have throw rugs and other things on the floor that can make you trip. What can I do with my stairs?  Do not leave any items on the stairs.  Make sure that there are handrails on both sides of the stairs and use them. Fix handrails that are broken or loose. Make sure that handrails are as long as the stairways.  Check any carpeting to make sure that it is firmly attached to the stairs. Fix any carpet that is loose or worn.  Avoid having throw rugs at the top or bottom of the stairs. If you do have throw rugs, attach them to the floor with carpet tape.  Make sure that you have a light switch at the top of the stairs and the bottom of the stairs. If you do not have them, ask someone to add them for you. What else can I do to help prevent falls?  Wear shoes  that:  Do not have high heels.  Have rubber bottoms.  Are comfortable and fit you well.  Are closed at the toe. Do not wear sandals.  If you use a stepladder:  Make sure that it is fully opened. Do not climb a closed stepladder.  Make sure that both sides of the stepladder are locked into place.  Ask someone to hold it for you, if possible.  Clearly mark and make sure that you can see:  Any grab bars or handrails.  First and last steps.  Where the edge of each step is.  Use tools that help you move around (mobility aids) if they are needed. These include:  Canes.  Walkers.  Scooters.  Crutches.  Turn on the lights when you go into a dark area. Replace any light bulbs as soon as they burn out.  Set up your furniture so you have a clear path. Avoid moving your furniture around.  If any of your floors are uneven, fix them.  If there are any pets around you, be aware of where they are.  Review your medicines with your doctor. Some medicines can make you feel dizzy. This can increase your chance of falling. Ask your doctor what other things that you can do to help prevent falls. This information is not intended to replace advice given to you by your health care provider. Make sure you discuss any questions you have with your health care provider. Document Released: 05/28/2009 Document Revised: 01/07/2016 Document Reviewed: 09/05/2014 Elsevier Interactive Patient Education  2017 [employer].

## 2017-10-04 ENCOUNTER — Telehealth: Payer: Self-pay | Admitting: Family Medicine

## 2017-10-04 ENCOUNTER — Ambulatory Visit: Payer: PPO | Admitting: General Surgery

## 2017-10-04 ENCOUNTER — Ambulatory Visit
Admission: RE | Admit: 2017-10-04 | Discharge: 2017-10-04 | Disposition: A | Discharge status: Home / Self Care | Payer: PPO | Source: Ambulatory Visit | Attending: General Surgery | Admitting: General Surgery

## 2017-10-04 DIAGNOSIS — D0511 Intraductal carcinoma in situ of right breast: Secondary | ICD-10-CM

## 2017-10-04 DIAGNOSIS — R922 Inconclusive mammogram: Secondary | ICD-10-CM | POA: Diagnosis not present

## 2017-10-04 HISTORY — DX: Personal history of irradiation: Z92.3

## 2017-10-04 NOTE — Telephone Encounter (Unsigned)
Copied from [place] [id number]. Topic: Inquiry >> Oct 04, 2017  4:11 PM Neva Seat wrote: Wenda Low w/ Dr. Nicolasa Ducking - [phone] -phone / [phone] -fax  Pt has an appt w/ Dr. Nicolasa Ducking on March 13 at 1 pm.  Dr. Nicolasa Ducking is needing the last office visit notes faxed to their office.

## 2017-10-06 ENCOUNTER — Telehealth: Payer: Self-pay | Admitting: Family Medicine

## 2017-10-06 NOTE — Telephone Encounter (Signed)
Called and left pt a VM to let her know that she can either come in for blood results for her appt with Dr. Tamala Julian on 10/09/17 or she can come in for a CPE. It was our mistake so it is her choice.   If pt calls in either leave the Visit type as a PHY if that is what she is doing or change it to an OV if she is going to just get lab results.  Thanks!

## 2017-10-06 NOTE — Telephone Encounter (Signed)
Last OV notes was sent/faxed to [phone]/Dr. Nicolasa Ducking today at 1121am.

## 2017-10-09 ENCOUNTER — Encounter: Payer: PPO | Admitting: Family Medicine

## 2017-10-10 NOTE — Progress Notes (Signed)
Subjective:    Patient ID: Selena Bush, female    DOB: August 14, 1946, 72 y.o.   MRN: [id number]  10/11/2017  Annual Exam    HPI This 72 y.o. female presents for evaluation of COMPLETE PHYSICAL EXAMINATION.  Last physical:  AWV 10-02-2017 [place] Pap smear:  hysterectomy Mammogram:  10-04-2017 Colonoscopy:     2015 Bone density:  Several years ago; no fracture hx; declines. Eye exam:  Glasses; 2019 Dental exam:  Every six months.  REFUSING FLU VACCINE THIS SEASON. Trying to get Shingrix.    Visual Acuity Screening   Right eye Left eye Both eyes  Without correction: 20/25 20/25 20/25   With correction:       BP Readings from Last 3 Encounters:  10/12/17 134/78  10/11/17 116/68  10/02/17 106/78   Wt Readings from Last 3 Encounters:  10/12/17 225 lb (102.1 kg)  10/11/17 224 lb (101.6 kg)  10/02/17 223 lb (101.2 kg)   Immunization History  Administered Date(s) Administered  . Influenza, High Dose Seasonal PF 05/05/2015  . Influenza,inj,Quad PF,6+ Mos 06/07/2016  . Pneumococcal Conjugate-13 02/06/2016  . Pneumococcal Polysaccharide-23 09/22/2014   Health Maintenance  Topic Date Due  . INFLUENZA VACCINE  11/12/2017 (Originally 03/15/2017)  . DEXA SCAN  12/12/2017 (Originally 05/15/2013)  . TETANUS/TDAP  10/02/2018 (Originally 05/16/1967)  . MAMMOGRAM  10/05/2019  . COLONOSCOPY  07/24/2024  . Hepatitis C Screening  Completed  . PNA vac Low Risk Adult  Completed   HTN: Patient reports good compliance with medication, good tolerance to medication, and good symptom control.   R breast ductal carcinoma in situ: due for follow-up with Dr. Bary Castilla tomorrow.  Maintained on Evista.  OSA: still awaiting referral for repeat sleep study.  Anxiety and depression: Patient reports good compliance with medication, good tolerance to medication, and good symptom control.  Had a very dark two weeks in January; has scheduled an appointment with psychiatry in upcoming month.     Insomnia: sleeping well with Belsomra and Zanaflex every night.  GERD: Patient reports good compliance with medication, good tolerance to medication, and good symptom control.    DDD lumbar spine: limits activity level; takes Tizanidine; range of motion has improved in past six months; activity level has increased.   Review of Systems  Constitutional: Negative for activity change, appetite change, chills, diaphoresis, fatigue, fever and unexpected weight change.  HENT: Positive for congestion and dental problem. Negative for drooling, ear discharge, ear pain, facial swelling, hearing loss, mouth sores, nosebleeds, postnasal drip, rhinorrhea, sinus pressure, sneezing, sore throat, tinnitus, trouble swallowing and voice change.   Eyes: Positive for photophobia. Negative for pain, discharge, redness, itching and visual disturbance.  Respiratory: Positive for cough. Negative for apnea, choking, chest tightness, shortness of breath, wheezing and stridor.   Cardiovascular: Negative for chest pain, palpitations and leg swelling.  Gastrointestinal: Positive for constipation. Negative for abdominal distention, abdominal pain, anal bleeding, blood in stool, diarrhea, nausea, rectal pain and vomiting.  Endocrine: Negative for cold intolerance, heat intolerance, polydipsia, polyphagia and polyuria.  Genitourinary: Negative for decreased urine volume, difficulty urinating, dyspareunia, dysuria, enuresis, flank pain, frequency, genital sores, hematuria, menstrual problem, pelvic pain, urgency, vaginal bleeding, vaginal discharge and vaginal pain.       Nocturia x 2.  Urinary leakage sometimes; not everyday.    Musculoskeletal: Positive for back pain. Negative for arthralgias, gait problem, joint swelling, myalgias, neck pain and neck stiffness.  Skin: Negative for color change, pallor, rash and wound.  Allergic/Immunologic: Negative for  environmental allergies, food allergies and immunocompromised state.   Neurological: Negative for dizziness, tremors, seizures, syncope, facial asymmetry, speech difficulty, weakness, light-headedness, numbness and headaches.  Hematological: Negative for adenopathy. Does not bruise/bleed easily.  Psychiatric/Behavioral: Positive for sleep disturbance. Negative for agitation, behavioral problems, confusion, decreased concentration, dysphoric mood, hallucinations, self-injury and suicidal ideas. The patient is not nervous/anxious and is not hyperactive.        Bedtime 200am; wakes up 1000am.      Past Medical History:  Diagnosis Date  . Anxiety   . Chronic diastolic CHF (congestive heart failure) ([place])    a. echo 09/2014: EF 20-94%, diastolic dysfunction, mild LVH, nl RV size & systolic function, mildly dilated LA (4.3 cm), mild MR/TR, mildly elevated PASP 36.7 mm Hg  . DDD (degenerative disc disease), cervical   . DDD (degenerative disc disease), lumbar   . Depression   . Diffuse cystic mastopathy 2014  . GERD (gastroesophageal reflux disease)   . Headache    rare  . HLD (hyperlipidemia)    a. statin intolerant 2/2 myalgias  . Hypercholesterolemia   . Hypertension   . Malignant neoplasm of upper-outer quadrant of female breast ([place]) 10/2012   Papillary DCIS, sentinel node negative. DR/PR positive. PARTIAL RIGHT MASTECTOMY FOR BREAST CANCER--HAD RADIATION - NO CHEMO --DR. Haakon ONCOLOGIST  . Obesity   . OSA on CPAP   . Osteoarthritis of both knees    a. s/p right TKA 04/2013 & left TKA 09/2014  . Otitis externa   . Personal history of radiation therapy 2015   RIGHT breast-mammosite per pt  . Sleep difficulties    LUNESTA HAS HELPED  . Vaginal cyst    Past Surgical History:  Procedure Laterality Date  . ABDOMINAL HYSTERECTOMY  1992   DUB; fibroids; endometriosis.  One remaining ovary.    [name] BREAST LUMPECTOMY Right 2015   Papillary DCIS, sentinel node negative. DR/PR positive. PARTIAL RIGHT MASTECTOMY FOR BREAST CANCER--HAD RADIATION - NO  CHEMO --DR. Lester ONCOLOGIST  . BREAST SURGERY Right March 2014   Wide excision,APB RT 10 mm papillary DCIS, ER/PR positive. Sentinel node negative. Partial breast radiation.  [name] CATARACT EXTRACTION, BILATERAL  02/13/2016   [place].  . CHOLECYSTECTOMY  1994  . COLONOSCOPY  2015   1 benign polyp-every 5 years/ Dr Candace Cruise  . ERCP  1995  . JOINT REPLACEMENT Right Sept 2014   knee  . TOTAL KNEE ARTHROPLASTY Right 04/16/2013   Procedure: RIGHT TOTAL KNEE ARTHROPLASTY;  Surgeon: Mauri Pole, MD;  Location: [facility];  Service: Orthopedics;  Laterality: Right;  . TOTAL KNEE ARTHROPLASTY Left 09/15/2014   Procedure: LEFT TOTAL KNEE ARTHROPLASTY;  Surgeon: Mauri Pole, MD;  Location: [facility];  Service: Orthopedics;  Laterality: Left;  . TUBAL LIGATION  1979   Allergies  Allergen Reactions  . Asa [Aspirin]   . Codeine     Gi problems   . Penicillins     anaphylaxis    . Statins Other (See Comments)    Leg pains  . Sulfa Antibiotics     anaphylaxis    Current Outpatient Medications on File Prior to Visit  Medication Sig Dispense Refill  . clindamycin (CLEOCIN) 300 MG capsule TK 2 CS PO 1 HOUR PRIOR TO TREATMENT/PRN  0  . raloxifene (EVISTA) 60 MG tablet TAKE 1 TABLET BY MOUTH EVERY DAY 90 tablet 3   No current facility-administered medications on file prior to visit.    Social History   Socioeconomic History  .  Marital status: Widowed    Spouse name: Chrissie Noa  . Number of children: 2  . Years of education: College  . Highest education level: Bachelor's degree (e.g., BA, AB, BS)  Social Needs  . Financial resource strain: Not hard at all  . Food insecurity - worry: Never true  . Food insecurity - inability: Never true  . Transportation needs - medical: No  . Transportation needs - non-medical: No  Occupational History  . Occupation: Retired  Tobacco Use  . Smoking status: Never Smoker  . Smokeless tobacco: Never Used  . Tobacco comment: social smoker as a teen    Substance and Sexual Activity  . Alcohol use: No  . Drug use: No  . Sexual activity: Not Currently    Birth control/protection: Post-menopausal, Surgical  Other Topics Concern  . Not on file  Social History Narrative   Marital status: widowed since 09/16/2015      Children: 2 children (47, 65); 5 grandchild (4 in [place]; 1 in [place]).      Lives: alone in townhome; 1 dog, 2 cats      Employment: psychiatric Education officer, museum; retired in 2015      Tobacco: teenager only      Alcohol:  None      Drugs: none      Exercise: walking in 2019; more active in 2019.  Walking daily small amounts.        ADLs: drives; independent with ADLs; no assistant devices      Advanced Directives: none; FULL CODE; no prolonged measures.   Does not need them.  Daughter/Heather Vista Deck is HCPOA.    Family History  Problem Relation Age of Onset  . Colon cancer Mother   . Cancer Mother 63       colon cancer  . Melanoma Father   . Cancer Father 43       melanoma  . Colon cancer Maternal Grandfather   . Breast cancer Maternal Grandfather        Objective:    BP 116/68   Pulse 82   Temp 98 F (36.7 C) (Oral)   Resp 16   Wt 224 lb (101.6 kg)   SpO2 95%   BMI 40.97 kg/m  Physical Exam  Constitutional: She is oriented to person, place, and time. She appears well-developed and well-nourished. No distress.  HENT:  Head: Normocephalic and atraumatic.  Right Ear: Hearing, tympanic membrane, external ear and ear canal normal.  Left Ear: Hearing, tympanic membrane, external ear and ear canal normal.  Nose: Nose normal.  Mouth/Throat: Oropharynx is clear and moist.  Eyes: Conjunctivae and EOM are normal. Pupils are equal, round, and reactive to light.  Neck: Normal range of motion and full passive range of motion without pain. Neck supple. No JVD present. Carotid bruit is not present. No thyromegaly present.  Cardiovascular: Normal rate, regular rhythm, normal heart sounds and intact distal pulses. Exam  reveals no gallop and no friction rub.  No murmur heard. Trace to 1+ pitting edema B lower extremities lower legs.  Pulmonary/Chest: Effort normal and breath sounds normal. No respiratory distress. She has no wheezes. She has no rales. Right breast exhibits no inverted nipple, no mass, no nipple discharge, no skin change and no tenderness. Left breast exhibits no inverted nipple, no mass, no nipple discharge, no skin change and no tenderness. Breasts are symmetrical.  Abdominal: Soft. Bowel sounds are normal. She exhibits no distension and no mass. There is no tenderness. There is no  rebound and no guarding.  Musculoskeletal: She exhibits edema.       Right shoulder: Normal.       Left shoulder: Normal.       Cervical back: Normal.  Lymphadenopathy:    She has no cervical adenopathy.  Neurological: She is alert and oriented to person, place, and time. She has normal reflexes. No cranial nerve deficit. She exhibits normal muscle tone. Coordination normal.  Skin: Skin is warm and dry. No rash noted. She is not diaphoretic. No erythema. No pallor.  Psychiatric: She has a normal mood and affect. Her behavior is normal. Judgment and thought content normal.  Nursing note and vitals reviewed.  No results found. Depression screen [facility] 2/9 10/11/2017 10/02/2017 09/18/2017 09/18/2017 05/30/2017  Decreased Interest 0 0 0 0 0  Down, Depressed, Hopeless 0 0 0 0 0  PHQ - 2 Score 0 0 0 0 0  Altered sleeping - 0 - - -  Tired, decreased energy - 0 - - -  Change in appetite - 0 - - -  Feeling bad or failure about yourself  - 1 - - -  Trouble concentrating - 0 - - -  Moving slowly or fidgety/restless - 0 - - -  Suicidal thoughts - 0 - - -  PHQ-9 Score - 1 - - -  Difficult doing work/chores - Not difficult at all - - -   Fall Risk  10/11/2017 10/02/2017 09/18/2017 09/18/2017 05/30/2017  Falls in the past year? Yes Yes No No Yes  Number falls in past yr: 1 1 - - 2 or more  Injury with Fall? - No - - No  Risk for  fall due to : - Other (Comment) - - -  Risk for fall due to: Comment - slipped and fell on a object  - - -  Follow up - Falls prevention discussed - - -        Assessment & Plan:   1. Routine physical examination   2. Essential hypertension, benign   3. Chronic diastolic CHF (congestive heart failure) (HCC)   4. Reactive depression   5. Hyperglycemia   6. Psychophysiological insomnia   7. Pure hypercholesterolemia   8. Muscle spasm   9. Class 3 severe obesity due to excess calories with serious comorbidity and body mass index (BMI) of 40.0 to 44.9 in adult [facility])     -anticipatory guidance provided --- exercise, weight loss, safe driving practices, aspirin 81mg  daily, calcium 600mg  bid or 3 servings of dairy daily. -obtain age appropriate screening labs and labs for chronic disease management. -moderate fall risk; undergoing treatment for depression; no evidence of hearing loss.  Discussed advanced directives and living will; also discussed end of life issues including code status.  -HTN: well controlled; refills provided. Stop Spironolactone due to renal insufficiency. -Anxiety with depression: improved; continue Cymbalta therapy.  Has upcoming appointment with psychiatry. -Hyperglycemia: stable; recent glucose 91.  -Renal insufficiency: persistent; stop Spironolactone; concern for over-diuresing.  -Insomnia: controlled with Belsomra and Tizanidine. -Hypercholesterolemia: persistently elevated; pt with statin intolerance.  I recommend weight loss, exercise, and low-cholesterol low-fat food choices.  I recommend limiting red meat to once per week; I recommend limiting fried foods to once per month. -history of R breast ductal carcinoma in situ: stable; upcoming appointment with general surgery. -OSA: check on referral for repeat sleep study. -Obesity: -recommend weight loss, exercise for 30-60 minutes five days per week; recommend 1200 kcal restriction per day with a minimum of 60  grams  of protein per day.   Orders Placed This Encounter  Procedures  . Urinalysis, dipstick only   Meds ordered this encounter  Medications  . atenolol (TENORMIN) 50 MG tablet    Sig: Take 1 tablet (50 mg total) by mouth daily.    Dispense:  90 tablet    Refill:  1  . DISCONTD: Suvorexant (BELSOMRA) 20 MG TABS    Sig: Take 1 tablet by mouth at bedtime.    Dispense:  30 tablet    Refill:  5    This request is for a new prescription for a controlled substance as required by Federal/State law..  . colchicine 0.6 MG tablet    Sig: Take 1 tablet (0.6 mg total) by mouth 2 (two) times daily.    Dispense:  30 tablet    Refill:  0  . DULoxetine (CYMBALTA) 60 MG capsule    Sig: Take 2 capsules (120 mg total) by mouth daily.    Dispense:  180 capsule    Refill:  3  . fluticasone (FLONASE) 50 MCG/ACT nasal spray    Sig: Place 2 sprays into both nostrils daily as needed for rhinitis or allergies.    Dispense:  17 g    Refill:  11  . furosemide (LASIX) 20 MG tablet    Sig: Take 1 tablet (20 mg total) by mouth daily.    Dispense:  90 tablet    Refill:  1  . lisinopril-hydrochlorothiazide (PRINZIDE,ZESTORETIC) 20-12.5 MG tablet    Sig: Take 1 tablet by mouth daily.    Dispense:  90 tablet    Refill:  1  . esomeprazole (NEXIUM) 40 MG capsule    Sig: Take 1 capsule (40 mg total) by mouth every morning.    Dispense:  90 capsule    Refill:  3  . tiZANidine (ZANAFLEX) 4 MG tablet    Sig: TAKE 1 TABLET BY MOUTH AT SUPPER AND TAKE 1 TABLET BY MOUTH AT BEDTIME    Dispense:  180 tablet    Refill:  5  . Suvorexant (BELSOMRA) 20 MG TABS    Sig: Take 1 tablet by mouth at bedtime.    Dispense:  30 tablet    Refill:  5    This request is for a new prescription for a controlled substance as required by Federal/State law..    Return in about 6 months (around 04/10/2018) for follow-up chronic medical conditions.   Kristi Elayne Guerin, M.D. Primary Care at [facility] previously Urgent  [place] [address] [place], [place]  [address] [phone] phone [phone] fax

## 2017-10-11 ENCOUNTER — Encounter: Payer: Self-pay | Admitting: Family Medicine

## 2017-10-11 ENCOUNTER — Ambulatory Visit (INDEPENDENT_AMBULATORY_CARE_PROVIDER_SITE_OTHER): Payer: PPO | Admitting: Family Medicine

## 2017-10-11 ENCOUNTER — Other Ambulatory Visit: Payer: Self-pay

## 2017-10-11 VITALS — BP 116/68 | HR 82 | Temp 98.0°F | Resp 16 | Wt 224.0 lb

## 2017-10-11 DIAGNOSIS — M62838 Other muscle spasm: Secondary | ICD-10-CM | POA: Diagnosis not present

## 2017-10-11 DIAGNOSIS — Z0000 Encounter for general adult medical examination without abnormal findings: Secondary | ICD-10-CM | POA: Diagnosis not present

## 2017-10-11 DIAGNOSIS — I10 Essential (primary) hypertension: Secondary | ICD-10-CM | POA: Diagnosis not present

## 2017-10-11 DIAGNOSIS — Z6841 Body Mass Index (BMI) 40.0 and over, adult: Secondary | ICD-10-CM | POA: Diagnosis not present

## 2017-10-11 DIAGNOSIS — E7800 Pure hypercholesterolemia, unspecified: Secondary | ICD-10-CM

## 2017-10-11 DIAGNOSIS — I5032 Chronic diastolic (congestive) heart failure: Secondary | ICD-10-CM

## 2017-10-11 DIAGNOSIS — R739 Hyperglycemia, unspecified: Secondary | ICD-10-CM | POA: Diagnosis not present

## 2017-10-11 DIAGNOSIS — F329 Major depressive disorder, single episode, unspecified: Secondary | ICD-10-CM | POA: Diagnosis not present

## 2017-10-11 DIAGNOSIS — F5104 Psychophysiologic insomnia: Secondary | ICD-10-CM | POA: Diagnosis not present

## 2017-10-11 MED ORDER — DULOXETINE HCL 60 MG PO CPEP: 120.0000 mg | ORAL_CAPSULE | Freq: Every day | ORAL | 3 refills | Status: DC

## 2017-10-11 MED ORDER — FUROSEMIDE 20 MG PO TABS: 20.0000 mg | ORAL_TABLET | Freq: Every day | ORAL | 1 refills | Status: DC

## 2017-10-11 MED ORDER — ESOMEPRAZOLE MAGNESIUM 40 MG PO CPDR: 40.0000 mg | DELAYED_RELEASE_CAPSULE | Freq: Every morning | ORAL | 3 refills | Status: DC

## 2017-10-11 MED ORDER — COLCHICINE 0.6 MG PO TABS: 0.6000 mg | ORAL_TABLET | Freq: Two times a day (BID) | ORAL | 0 refills | Status: DC

## 2017-10-11 MED ORDER — TIZANIDINE HCL 4 MG PO TABS: ORAL_TABLET | ORAL | 5 refills | Status: DC

## 2017-10-11 MED ORDER — SUVOREXANT 20 MG PO TABS: 1.0000 | ORAL_TABLET | Freq: Every day | ORAL | 5 refills | Status: DC

## 2017-10-11 MED ORDER — LISINOPRIL-HYDROCHLOROTHIAZIDE 20-12.5 MG PO TABS: 1.0000 | ORAL_TABLET | Freq: Every day | ORAL | 1 refills | Status: DC

## 2017-10-11 MED ORDER — ATENOLOL 50 MG PO TABS: 50.0000 mg | ORAL_TABLET | Freq: Every day | ORAL | 1 refills | Status: DC

## 2017-10-11 MED ORDER — FLUTICASONE PROPIONATE 50 MCG/ACT NA SUSP: 2.0000 | Freq: Every day | NASAL | 11 refills | Status: DC | PRN

## 2017-10-11 NOTE — Patient Instructions (Addendum)
STOP SPIRONOLACTONE.     IF you received an x-ray today, you will receive an invoice from [facility]. Please contact [facility] at [phone] with questions or concerns regarding your invoice.   IF you received labwork today, you will receive an invoice from [facility]. Please contact [facility] at [phone] with questions or concerns regarding your invoice.   Our billing staff will not be able to assist you with questions regarding bills from these companies.  You will be contacted with the lab results as soon as they are available. The fastest way to get your results is to activate your My Chart account. Instructions are located on the last page of this paperwork. If you have not heard from [place] regarding the results in 2 weeks, please contact this office.      Preventive Care 40 Years and Older, Female Preventive care refers to lifestyle choices and visits with your health care provider that can promote health and wellness. What does preventive care include?  A yearly physical exam. This is also called an annual well check.  Dental exams once or twice a year.  Routine eye exams. Ask your health care provider how often you should have your eyes checked.  Personal lifestyle choices, including: ? Daily care of your teeth and gums. ? Regular physical activity. ? Eating a healthy diet. ? Avoiding tobacco and drug use. ? Limiting alcohol use. ? Practicing safe sex. ? Taking low-dose aspirin every day. ? Taking vitamin and mineral supplements as recommended by your health care provider. What happens during an annual well check? The services and screenings done by your health care provider during your annual well check will depend on your age, overall health, lifestyle risk factors, and family history of disease. Counseling Your health care provider may ask you questions about your:  Alcohol use.  Tobacco use.  Drug use.  Emotional well-being.  Home and  relationship well-being.  Sexual activity.  Eating habits.  History of falls.  Memory and ability to understand (cognition).  Work and work Statistician.  Reproductive health.  Screening You may have the following tests or measurements:  Height, weight, and BMI.  Blood pressure.  Lipid and cholesterol levels. These may be checked every 5 years, or more frequently if you are over 61 years old.  Skin check.  Lung cancer screening. You may have this screening every year starting at age 36 if you have a 30-pack-year history of smoking and currently smoke or have quit within the past 15 years.  Fecal occult blood test (FOBT) of the stool. You may have this test every year starting at age 87.  Flexible sigmoidoscopy or colonoscopy. You may have a sigmoidoscopy every 5 years or a colonoscopy every 10 years starting at age 54.  Hepatitis C blood test.  Hepatitis B blood test.  Sexually transmitted disease (STD) testing.  Diabetes screening. This is done by checking your blood sugar (glucose) after you have not eaten for a while (fasting). You may have this done every 1-3 years.  Bone density scan. This is done to screen for osteoporosis. You may have this done starting at age 95.  Mammogram. This may be done every 1-2 years. Talk to your health care provider about how often you should have regular mammograms.  Talk with your health care provider about your test results, treatment options, and if necessary, the need for more tests. Vaccines Your health care provider may recommend certain vaccines, such as:  Influenza vaccine. This is recommended  every year.  Tetanus, diphtheria, and acellular pertussis (Tdap, Td) vaccine. You may need a Td booster every 10 years.  Varicella vaccine. You may need this if you have not been vaccinated.  Zoster vaccine. You may need this after age 63.  Measles, mumps, and rubella (MMR) vaccine. You may need at least one dose of MMR if you  were born in 1957 or later. You may also need a second dose.  Pneumococcal 13-valent conjugate (PCV13) vaccine. One dose is recommended after age 22.  Pneumococcal polysaccharide (PPSV23) vaccine. One dose is recommended after age 79.  Meningococcal vaccine. You may need this if you have certain conditions.  Hepatitis A vaccine. You may need this if you have certain conditions or if you travel or work in places where you may be exposed to hepatitis A.  Hepatitis B vaccine. You may need this if you have certain conditions or if you travel or work in places where you may be exposed to hepatitis B.  Haemophilus influenzae type b (Hib) vaccine. You may need this if you have certain conditions.  Talk to your health care provider about which screenings and vaccines you need and how often you need them. This information is not intended to replace advice given to you by your health care provider. Make sure you discuss any questions you have with your health care provider. Document Released: 08/28/2015 Document Revised: 04/20/2016 Document Reviewed: 06/02/2015 Elsevier Interactive Patient Education  [employer].

## 2017-10-12 ENCOUNTER — Ambulatory Visit (INDEPENDENT_AMBULATORY_CARE_PROVIDER_SITE_OTHER): Payer: PPO | Admitting: General Surgery

## 2017-10-12 ENCOUNTER — Encounter: Payer: Self-pay | Admitting: General Surgery

## 2017-10-12 VITALS — BP 134/78 | HR 71 | Resp 14 | Ht 60.0 in | Wt 225.0 lb

## 2017-10-12 DIAGNOSIS — D0511 Intraductal carcinoma in situ of right breast: Secondary | ICD-10-CM

## 2017-10-12 NOTE — Patient Instructions (Addendum)
The patient is aware to call back for any questions or new concerns.  Continue Evista until June then may discontinue. Colonoscopy due 2020

## 2017-10-12 NOTE — Progress Notes (Signed)
Patient ID: Selena Bush, female   DOB: [birth year], 72 y.o.   MRN: [id number]  Chief Complaint  Patient presents with  . Follow-up    HPI Selena Bush is a 72 y.o. female who presents for a breast evaluation. The most recent mammogram was done on 09/26/2017.  Patient does perform regular self breast checks and gets regular mammograms done.  No new breast issues.  HPI  Past Medical History:  Diagnosis Date  . Anxiety   . Chronic diastolic CHF (congestive heart failure) ([place])    a. echo 09/2014: EF 35-36%, diastolic dysfunction, mild LVH, nl RV size & systolic function, mildly dilated LA (4.3 cm), mild MR/TR, mildly elevated PASP 36.7 mm Hg  . DDD (degenerative disc disease), cervical   . DDD (degenerative disc disease), lumbar   . Depression   . Diffuse cystic mastopathy 2014  . GERD (gastroesophageal reflux disease)   . Headache    rare  . HLD (hyperlipidemia)    a. statin intolerant 2/2 myalgias  . Hypercholesterolemia   . Hypertension   . Malignant neoplasm of upper-outer quadrant of female breast (Selena Bush) 10/2012   Papillary DCIS, sentinel node negative. DR/PR positive. PARTIAL RIGHT MASTECTOMY FOR BREAST CANCER--HAD RADIATION - NO CHEMO --DR. Wellington ONCOLOGIST  . Obesity   . OSA on CPAP   . Osteoarthritis of both knees    a. s/p right TKA 04/2013 & left TKA 09/2014  . Otitis externa   . Personal history of radiation therapy 2015   RIGHT breast-mammosite per pt  . Sleep difficulties    LUNESTA HAS HELPED  . Vaginal cyst     Past Surgical History:  Procedure Laterality Date  . ABDOMINAL HYSTERECTOMY  1992   DUB; fibroids; endometriosis.  One remaining ovary.    Selena Bush BREAST LUMPECTOMY Right 2015   Papillary DCIS, sentinel node negative. DR/PR positive. PARTIAL RIGHT MASTECTOMY FOR BREAST CANCER--HAD RADIATION - NO CHEMO --DR. Payne ONCOLOGIST  . BREAST SURGERY Right March 2014   Wide excision,APB RT 10 mm papillary DCIS, ER/PR positive. Sentinel  node negative. Partial breast radiation.  Selena Bush CATARACT EXTRACTION, BILATERAL  02/13/2016   [facility].  . CHOLECYSTECTOMY  1994  . COLONOSCOPY  2015   1 benign polyp-every 5 years/ Dr Candace Cruise  . ERCP  1995  . JOINT REPLACEMENT Right Sept 2014   knee  . TOTAL KNEE ARTHROPLASTY Right 04/16/2013   Procedure: RIGHT TOTAL KNEE ARTHROPLASTY;  Surgeon: Mauri Pole, MD;  Location: [facility];  Service: Orthopedics;  Laterality: Right;  . TOTAL KNEE ARTHROPLASTY Left 09/15/2014   Procedure: LEFT TOTAL KNEE ARTHROPLASTY;  Surgeon: Mauri Pole, MD;  Location: [facility];  Service: Orthopedics;  Laterality: Left;  . TUBAL LIGATION  1979    Family History  Problem Relation Age of Onset  . Colon cancer Mother   . Cancer Mother 20       colon cancer  . Melanoma Father   . Cancer Father 24       melanoma  . Colon cancer Maternal Grandfather   . Breast cancer Maternal Grandfather     Social History Social History   Tobacco Use  . Smoking status: Never Smoker  . Smokeless tobacco: Never Used  . Tobacco comment: social smoker as a teen  Substance Use Topics  . Alcohol use: No  . Drug use: No    Allergies  Allergen Reactions  . Asa [Aspirin]   . Codeine     Gi problems   .  Penicillins     anaphylaxis    . Statins Other (See Comments)    Leg pains  . Sulfa Antibiotics     anaphylaxis     Current Outpatient Medications  Medication Sig Dispense Refill  . atenolol (TENORMIN) 50 MG tablet Take 1 tablet (50 mg total) by mouth daily. 90 tablet 1  . clindamycin (CLEOCIN) 300 MG capsule TK 2 CS PO 1 HOUR PRIOR TO TREATMENT/PRN  0  . colchicine 0.6 MG tablet Take 1 tablet (0.6 mg total) by mouth 2 (two) times daily. 30 tablet 0  . DULoxetine (CYMBALTA) 60 MG capsule Take 2 capsules (120 mg total) by mouth daily. 180 capsule 3  . esomeprazole (NEXIUM) 40 MG capsule Take 1 capsule (40 mg total) by mouth every morning. 90 capsule 3  . fluticasone (FLONASE) 50 MCG/ACT nasal spray Place 2 sprays into both  nostrils daily as needed for rhinitis or allergies. 17 g 11  . furosemide (LASIX) 20 MG tablet Take 1 tablet (20 mg total) by mouth daily. 90 tablet 1  . lisinopril-hydrochlorothiazide (PRINZIDE,ZESTORETIC) 20-12.5 MG tablet Take 1 tablet by mouth daily. 90 tablet 1  . raloxifene (EVISTA) 60 MG tablet TAKE 1 TABLET BY MOUTH EVERY DAY 90 tablet 3  . Suvorexant (BELSOMRA) 20 MG TABS Take 1 tablet by mouth at bedtime. 30 tablet 5  . tiZANidine (ZANAFLEX) 4 MG tablet TAKE 1 TABLET BY MOUTH AT SUPPER AND TAKE 1 TABLET BY MOUTH AT BEDTIME 180 tablet 5   No current facility-administered medications for this visit.     Review of Systems Review of Systems  Constitutional: Negative.   Respiratory: Negative.   Cardiovascular: Negative.     Blood pressure 134/78, pulse 71, resp. rate 14, height 5' (1.524 m), weight 225 lb (102.1 kg).  Physical Exam Physical Exam  Constitutional: She is oriented to person, place, and time. She appears well-developed and well-nourished.  Eyes: Conjunctivae are normal. No scleral icterus.  Neck: Neck supple.  Cardiovascular: Normal rate and regular rhythm.  Murmur heard.  Systolic murmur is present with a grade of 2/6. Pulmonary/Chest: Effort normal and breath sounds normal. Right breast exhibits no inverted nipple, no mass, no nipple discharge, no skin change and no tenderness. Left breast exhibits no inverted nipple, no mass, no nipple discharge, no skin change and no tenderness.    Scarring post radiatio right breast, incision well healed. Mild heat rash below left breast.  Lymphadenopathy:    She has no cervical adenopathy.    She has no axillary adenopathy.  Neurological: She is alert and oriented to person, place, and time.  Skin: Skin is warm and dry.    Data Reviewed Bilateral diagnostic mammograms dated October 04, 2017 reviewed.  Postsurgical changes.  Assessment    Doing well now almost 5 years post management of DCIS involving the right  breast.    Plan    The patient has been asked to return to the office in one year with a bilateral diagnostic mammogram. Continue Evista until June then may discontinue. Colonoscopy due 2020  HPI, Physical Exam, Assessment and Plan have been scribed under the direction and in the presence of Robert Bellow, MD. *Karie Fetch, RN  I have completed the exam and reviewed the above documentation for accuracy and completeness.  I agree with the above.  Haematologist has been used and any errors in dictation or transcription are unintentional.  Hervey Ard, M.D., F.A.C.S.   Forest Gleason Gayleen Sholtz 10/13/2017, 7:34 PM

## 2017-10-25 DIAGNOSIS — F331 Major depressive disorder, recurrent, moderate: Secondary | ICD-10-CM | POA: Diagnosis not present

## 2017-10-25 DIAGNOSIS — F4312 Post-traumatic stress disorder, chronic: Secondary | ICD-10-CM | POA: Diagnosis not present

## 2017-11-02 DIAGNOSIS — E876 Hypokalemia: Secondary | ICD-10-CM | POA: Diagnosis not present

## 2017-11-02 DIAGNOSIS — E871 Hypo-osmolality and hyponatremia: Secondary | ICD-10-CM | POA: Diagnosis not present

## 2017-11-02 DIAGNOSIS — R072 Precordial pain: Secondary | ICD-10-CM | POA: Diagnosis not present

## 2017-11-04 ENCOUNTER — Encounter (HOSPITAL_COMMUNITY): Payer: Self-pay | Admitting: Emergency Medicine

## 2017-11-04 ENCOUNTER — Emergency Department (HOSPITAL_COMMUNITY): Payer: PPO

## 2017-11-04 ENCOUNTER — Inpatient Hospital Stay (HOSPITAL_COMMUNITY)
Admission: EM | Admit: 2017-11-04 | Discharge: 2017-11-10 | DRG: 392 | Disposition: A | Discharge status: Home / Self Care | Payer: PPO | Attending: Internal Medicine | Admitting: Internal Medicine

## 2017-11-04 ENCOUNTER — Other Ambulatory Visit: Payer: Self-pay

## 2017-11-04 DIAGNOSIS — G4733 Obstructive sleep apnea (adult) (pediatric): Secondary | ICD-10-CM | POA: Diagnosis not present

## 2017-11-04 DIAGNOSIS — I480 Paroxysmal atrial fibrillation: Secondary | ICD-10-CM | POA: Diagnosis not present

## 2017-11-04 DIAGNOSIS — R079 Chest pain, unspecified: Secondary | ICD-10-CM | POA: Diagnosis not present

## 2017-11-04 DIAGNOSIS — F330 Major depressive disorder, recurrent, mild: Secondary | ICD-10-CM | POA: Diagnosis not present

## 2017-11-04 DIAGNOSIS — Z96653 Presence of artificial knee joint, bilateral: Secondary | ICD-10-CM | POA: Diagnosis present

## 2017-11-04 DIAGNOSIS — R0789 Other chest pain: Secondary | ICD-10-CM | POA: Diagnosis not present

## 2017-11-04 DIAGNOSIS — Z86000 Personal history of in-situ neoplasm of breast: Secondary | ICD-10-CM | POA: Diagnosis not present

## 2017-11-04 DIAGNOSIS — R0601 Orthopnea: Secondary | ICD-10-CM | POA: Diagnosis not present

## 2017-11-04 DIAGNOSIS — R072 Precordial pain: Secondary | ICD-10-CM | POA: Diagnosis not present

## 2017-11-04 DIAGNOSIS — I5032 Chronic diastolic (congestive) heart failure: Secondary | ICD-10-CM | POA: Diagnosis not present

## 2017-11-04 DIAGNOSIS — K219 Gastro-esophageal reflux disease without esophagitis: Secondary | ICD-10-CM | POA: Diagnosis present

## 2017-11-04 DIAGNOSIS — E785 Hyperlipidemia, unspecified: Secondary | ICD-10-CM | POA: Diagnosis present

## 2017-11-04 DIAGNOSIS — Z853 Personal history of malignant neoplasm of breast: Secondary | ICD-10-CM

## 2017-11-04 DIAGNOSIS — F419 Anxiety disorder, unspecified: Secondary | ICD-10-CM | POA: Diagnosis not present

## 2017-11-04 DIAGNOSIS — F329 Major depressive disorder, single episode, unspecified: Secondary | ICD-10-CM | POA: Diagnosis not present

## 2017-11-04 DIAGNOSIS — I959 Hypotension, unspecified: Secondary | ICD-10-CM | POA: Diagnosis not present

## 2017-11-04 DIAGNOSIS — Z923 Personal history of irradiation: Secondary | ICD-10-CM | POA: Diagnosis not present

## 2017-11-04 DIAGNOSIS — Z803 Family history of malignant neoplasm of breast: Secondary | ICD-10-CM

## 2017-11-04 DIAGNOSIS — Z90710 Acquired absence of both cervix and uterus: Secondary | ICD-10-CM

## 2017-11-04 DIAGNOSIS — Z9049 Acquired absence of other specified parts of digestive tract: Secondary | ICD-10-CM

## 2017-11-04 DIAGNOSIS — Z79899 Other long term (current) drug therapy: Secondary | ICD-10-CM | POA: Diagnosis not present

## 2017-11-04 DIAGNOSIS — Z9989 Dependence on other enabling machines and devices: Secondary | ICD-10-CM | POA: Diagnosis not present

## 2017-11-04 DIAGNOSIS — D72829 Elevated white blood cell count, unspecified: Secondary | ICD-10-CM | POA: Diagnosis not present

## 2017-11-04 DIAGNOSIS — I472 Ventricular tachycardia: Secondary | ICD-10-CM | POA: Diagnosis not present

## 2017-11-04 DIAGNOSIS — Z9119 Patient's noncompliance with other medical treatment and regimen: Secondary | ICD-10-CM

## 2017-11-04 DIAGNOSIS — T502X5A Adverse effect of carbonic-anhydrase inhibitors, benzothiadiazides and other diuretics, initial encounter: Secondary | ICD-10-CM | POA: Diagnosis not present

## 2017-11-04 DIAGNOSIS — D72828 Other elevated white blood cell count: Secondary | ICD-10-CM | POA: Diagnosis not present

## 2017-11-04 DIAGNOSIS — E7800 Pure hypercholesterolemia, unspecified: Secondary | ICD-10-CM | POA: Diagnosis present

## 2017-11-04 DIAGNOSIS — I4891 Unspecified atrial fibrillation: Secondary | ICD-10-CM | POA: Diagnosis not present

## 2017-11-04 DIAGNOSIS — Z800 Family history of malignant neoplasm of digestive organs: Secondary | ICD-10-CM

## 2017-11-04 DIAGNOSIS — I340 Nonrheumatic mitral (valve) insufficiency: Secondary | ICD-10-CM | POA: Diagnosis not present

## 2017-11-04 DIAGNOSIS — E876 Hypokalemia: Secondary | ICD-10-CM | POA: Diagnosis present

## 2017-11-04 DIAGNOSIS — Z6841 Body Mass Index (BMI) 40.0 and over, adult: Secondary | ICD-10-CM

## 2017-11-04 DIAGNOSIS — I309 Acute pericarditis, unspecified: Secondary | ICD-10-CM | POA: Diagnosis not present

## 2017-11-04 DIAGNOSIS — R0600 Dyspnea, unspecified: Secondary | ICD-10-CM | POA: Diagnosis not present

## 2017-11-04 DIAGNOSIS — Z9011 Acquired absence of right breast and nipple: Secondary | ICD-10-CM | POA: Diagnosis not present

## 2017-11-04 DIAGNOSIS — I5030 Unspecified diastolic (congestive) heart failure: Secondary | ICD-10-CM | POA: Diagnosis not present

## 2017-11-04 DIAGNOSIS — E222 Syndrome of inappropriate secretion of antidiuretic hormone: Secondary | ICD-10-CM | POA: Diagnosis not present

## 2017-11-04 DIAGNOSIS — I110 Hypertensive heart disease with heart failure: Secondary | ICD-10-CM | POA: Diagnosis not present

## 2017-11-04 DIAGNOSIS — E871 Hypo-osmolality and hyponatremia: Secondary | ICD-10-CM | POA: Diagnosis not present

## 2017-11-04 DIAGNOSIS — I314 Cardiac tamponade: Secondary | ICD-10-CM | POA: Diagnosis not present

## 2017-11-04 DIAGNOSIS — F339 Major depressive disorder, recurrent, unspecified: Secondary | ICD-10-CM | POA: Diagnosis not present

## 2017-11-04 LAB — BASIC METABOLIC PANEL
ANION GAP: 15 (ref 5–15)
BUN: 10 mg/dL (ref 6–20)
CALCIUM: 8.9 mg/dL (ref 8.9–10.3)
CO2: 22 mmol/L (ref 22–32)
CREATININE: 1.08 mg/dL — AB (ref 0.44–1.00)
Chloride: 87 mmol/L — ABNORMAL LOW (ref 101–111)
GFR, EST AFRICAN AMERICAN: 59 mL/min — AB (ref >60)
GFR, EST NON AFRICAN AMERICAN: 51 mL/min — AB (ref >60)
Glucose, Bld: 151 mg/dL — ABNORMAL HIGH (ref 65–99)
Potassium: 3.2 mmol/L — ABNORMAL LOW (ref 3.5–5.1)
Sodium: 124 mmol/L — ABNORMAL LOW (ref 135–145)

## 2017-11-04 LAB — URINALYSIS, ROUTINE W REFLEX MICROSCOPIC
Bilirubin Urine: NEGATIVE
Glucose, UA: NEGATIVE mg/dL
Hgb urine dipstick: NEGATIVE
Ketones, ur: NEGATIVE mg/dL
Nitrite: NEGATIVE
Protein, ur: NEGATIVE mg/dL
Specific Gravity, Urine: 1.006 (ref 1.005–1.030)
pH: 7 (ref 5.0–8.0)

## 2017-11-04 LAB — CBC
HCT: 36 % (ref 36.0–46.0)
Hemoglobin: 11.5 g/dL — ABNORMAL LOW (ref 12.0–15.0)
MCH: 27 pg (ref 26.0–34.0)
MCHC: 31.9 g/dL (ref 30.0–36.0)
MCV: 84.5 fL (ref 78.0–100.0)
PLATELETS: 312 10*3/uL (ref 150–400)
RBC: 4.26 MIL/uL (ref 3.87–5.11)
RDW: 13.8 % (ref 11.5–15.5)
WBC: 17.1 10*3/uL — ABNORMAL HIGH (ref 4.0–10.5)

## 2017-11-04 LAB — HEMOGLOBIN A1C
HEMOGLOBIN A1C: 5.7 % — AB (ref 4.8–5.6)
MEAN PLASMA GLUCOSE: 116.89 mg/dL

## 2017-11-04 LAB — TROPONIN I
Troponin I: <0.03 ng/mL
Troponin I: <0.03 ng/mL (ref <0.03)

## 2017-11-04 LAB — TSH: TSH: 3.31 u[IU]/mL (ref 0.350–4.500)

## 2017-11-04 LAB — I-STAT TROPONIN, ED: TROPONIN I, POC: 0 ng/mL (ref 0.00–0.08)

## 2017-11-04 LAB — MRSA PCR SCREENING: MRSA by PCR: NEGATIVE

## 2017-11-04 MED ORDER — SERTRALINE HCL 50 MG PO TABS
50.0000 mg | ORAL_TABLET | Freq: Every day | ORAL | Status: DC
  Administered 2017-11-05 – 2017-11-10 (×6): 50 mg via ORAL
  Filled 2017-11-04 (×6): qty 1

## 2017-11-04 MED ORDER — SUVOREXANT 20 MG PO TABS
1.0000 | ORAL_TABLET | Freq: Every day | ORAL | Status: DC
  Administered 2017-11-04 – 2017-11-09 (×6): 1 via ORAL
  Filled 2017-11-04: qty 1

## 2017-11-04 MED ORDER — ENOXAPARIN SODIUM 60 MG/0.6ML ~~LOC~~ SOLN
50.0000 mg | SUBCUTANEOUS | Status: DC
  Administered 2017-11-04 – 2017-11-05 (×2): 50 mg via SUBCUTANEOUS
  Filled 2017-11-04 (×2): qty 0.6

## 2017-11-04 MED ORDER — GI COCKTAIL ~~LOC~~
30.0000 mL | Freq: Three times a day (TID) | ORAL | Status: DC | PRN
  Administered 2017-11-04 – 2017-11-06 (×5): 30 mL via ORAL
  Filled 2017-11-04 (×5): qty 30

## 2017-11-04 MED ORDER — SENNOSIDES-DOCUSATE SODIUM 8.6-50 MG PO TABS: 1.0000 | ORAL_TABLET | Freq: Every evening | ORAL | Status: DC | PRN

## 2017-11-04 MED ORDER — LISINOPRIL-HYDROCHLOROTHIAZIDE 20-12.5 MG PO TABS: 1.0000 | ORAL_TABLET | Freq: Every day | ORAL | Status: DC

## 2017-11-04 MED ORDER — TIZANIDINE HCL 4 MG PO TABS
8.0000 mg | ORAL_TABLET | Freq: Three times a day (TID) | ORAL | Status: DC | PRN
  Administered 2017-11-04 – 2017-11-09 (×4): 8 mg via ORAL
  Filled 2017-11-04 (×6): qty 2

## 2017-11-04 MED ORDER — LISINOPRIL 20 MG PO TABS
20.0000 mg | ORAL_TABLET | Freq: Every day | ORAL | Status: DC
  Administered 2017-11-04 – 2017-11-05 (×2): 20 mg via ORAL
  Filled 2017-11-04 (×2): qty 1

## 2017-11-04 MED ORDER — GI COCKTAIL ~~LOC~~
30.0000 mL | Freq: Once | ORAL | Status: AC
  Administered 2017-11-04: 30 mL via ORAL
  Filled 2017-11-04: qty 30

## 2017-11-04 MED ORDER — HYDROXYZINE HCL 25 MG PO TABS
25.0000 mg | ORAL_TABLET | Freq: Three times a day (TID) | ORAL | Status: DC | PRN
  Administered 2017-11-04 – 2017-11-08 (×2): 25 mg via ORAL
  Filled 2017-11-04 (×2): qty 1

## 2017-11-04 MED ORDER — ACETAMINOPHEN 650 MG RE SUPP: 650.0000 mg | Freq: Four times a day (QID) | RECTAL | Status: DC | PRN

## 2017-11-04 MED ORDER — LORAZEPAM 1 MG PO TABS: 1.0000 mg | ORAL_TABLET | Freq: Once | ORAL | Status: DC

## 2017-11-04 MED ORDER — PANTOPRAZOLE SODIUM 40 MG PO TBEC
40.0000 mg | DELAYED_RELEASE_TABLET | Freq: Every day | ORAL | Status: DC
  Administered 2017-11-04 – 2017-11-07 (×5): 40 mg via ORAL
  Filled 2017-11-04 (×5): qty 1

## 2017-11-04 MED ORDER — ENSURE ENLIVE PO LIQD
237.0000 mL | Freq: Two times a day (BID) | ORAL | Status: DC
  Administered 2017-11-05: 237 mL via ORAL

## 2017-11-04 MED ORDER — HYDROCHLOROTHIAZIDE 12.5 MG PO CAPS
12.5000 mg | ORAL_CAPSULE | Freq: Every day | ORAL | Status: DC
  Administered 2017-11-04: 12.5 mg via ORAL
  Filled 2017-11-04: qty 1

## 2017-11-04 MED ORDER — MORPHINE SULFATE (PF) 4 MG/ML IV SOLN: 4.0000 mg | Freq: Once | INTRAVENOUS | Status: DC

## 2017-11-04 MED ORDER — ATENOLOL 50 MG PO TABS
50.0000 mg | ORAL_TABLET | Freq: Every day | ORAL | Status: DC
Start: 2017-11-04 — End: 2017-11-05
  Administered 2017-11-04: 50 mg via ORAL
  Filled 2017-11-04 (×2): qty 1
  Filled 2017-11-04: qty 2

## 2017-11-04 MED ORDER — ONDANSETRON HCL 4 MG/2ML IJ SOLN: 4.0000 mg | Freq: Four times a day (QID) | INTRAMUSCULAR | Status: DC | PRN

## 2017-11-04 MED ORDER — ACETAMINOPHEN 325 MG PO TABS
650.0000 mg | ORAL_TABLET | Freq: Four times a day (QID) | ORAL | Status: DC | PRN
  Administered 2017-11-06: 650 mg via ORAL
  Filled 2017-11-04: qty 2

## 2017-11-04 MED ORDER — ONDANSETRON HCL 4 MG PO TABS: 4.0000 mg | ORAL_TABLET | Freq: Four times a day (QID) | ORAL | Status: DC | PRN

## 2017-11-04 MED ORDER — FUROSEMIDE 20 MG PO TABS
20.0000 mg | ORAL_TABLET | Freq: Every day | ORAL | Status: DC
  Administered 2017-11-04 – 2017-11-05 (×2): 20 mg via ORAL
  Filled 2017-11-04 (×2): qty 1

## 2017-11-04 MED ORDER — POTASSIUM CHLORIDE CRYS ER 20 MEQ PO TBCR
40.0000 meq | EXTENDED_RELEASE_TABLET | Freq: Once | ORAL | Status: AC
  Administered 2017-11-04: 40 meq via ORAL
  Filled 2017-11-04: qty 2

## 2017-11-04 MED ORDER — SUCRALFATE 1 GM/10ML PO SUSP
1.0000 g | Freq: Three times a day (TID) | ORAL | Status: DC
  Administered 2017-11-04 – 2017-11-05 (×5): 1 g via ORAL
  Filled 2017-11-04 (×8): qty 10

## 2017-11-04 MED ORDER — RALOXIFENE HCL 60 MG PO TABS
60.0000 mg | ORAL_TABLET | Freq: Every day | ORAL | Status: DC
  Administered 2017-11-04 – 2017-11-10 (×7): 60 mg via ORAL
  Filled 2017-11-04 (×7): qty 1

## 2017-11-04 MED ORDER — LORAZEPAM 2 MG/ML IJ SOLN: 0.5000 mg | Freq: Once | INTRAMUSCULAR | Status: DC

## 2017-11-04 NOTE — ED Notes (Signed)
MD contacted to make aware no diet  In orders and chest pain continues. Orders resulting.

## 2017-11-04 NOTE — ED Triage Notes (Signed)
PT to BR in [facility] by this RN. Pt then returned to room via [place] byh this RN. On return to room Pt reported she wanted the meds the DO. Ordered for her.

## 2017-11-04 NOTE — Plan of Care (Signed)
Patient progressing in care plan goals.   

## 2017-11-04 NOTE — H&P (Addendum)
Date: 11/04/2017               Patient Name:  Selena Bush MRN: [id number]  DOB: [birth year] Age / Sex: 72 y.o., female   PCP: Wardell Honour, MD         Medical Service: Internal Medicine Teaching Service         Attending Physician: Dr. Hayden Rasmussen, MD    First Contact: Dr. Lars Mage Pager: [phone]  Second Contact: Dr. Einar Gip Pager: [phone]       After Hours (After 5p/  First Contact Pager: [phone]  weekends / holidays): Second Contact Pager: [phone]   Chief Complaint: Chest Pain  History of Present Illness: Selena Bush is a 72 y.o f with chronic diastolic chf, essential htn, papillary DCIS DR/PR positive s/p partial right mastectomy and radiation, HLD, OSA on cpap who presents with chest pain. The patient stated that the chest pain is 4/10 intensity, pressure like in nature, present substernally and radiates up chin. She does not have any accompanied sob, palpitations, or impending sense of doom. She has not had chest pain like this previously.  The patient states that she felt that the chest pain maybe linked to her heartburn. She took zantac, but that did not alleviate the chest discomfort. She states that she ate a slice of cheese pizza and a cherry pepsi and earlier in the day she had Dr. Malachi Bonds for lunch. She states that she usually takes Nexium daily as well.   The patient denies any fever, nausea/vomiting, abdominal pain, diarrhea.   ED Course: Hypertension (157/90), pulse=78, afebrile, resp=24, spo2=96% GI cocktail, kdur given  Meds:  Current Meds  Medication Sig  . atenolol (TENORMIN) 50 MG tablet Take 1 tablet (50 mg total) by mouth daily.  . colchicine 0.6 MG tablet Take 1 tablet (0.6 mg total) by mouth 2 (two) times daily. (Patient taking differently: Take 0.6 mg by mouth 2 (two) times daily as needed (for gout). )  . DULoxetine (CYMBALTA) 60 MG capsule Take 2 capsules (120 mg total) by mouth daily.  [name] esomeprazole (NEXIUM) 40 MG capsule Take  1 capsule (40 mg total) by mouth every morning.  . fluticasone (FLONASE) 50 MCG/ACT nasal spray Place 2 sprays into both nostrils daily as needed for rhinitis or allergies.  . furosemide (LASIX) 20 MG tablet Take 1 tablet (20 mg total) by mouth daily.  [name] lisinopril-hydrochlorothiazide (PRINZIDE,ZESTORETIC) 20-12.5 MG tablet Take 1 tablet by mouth daily.  . raloxifene (EVISTA) 60 MG tablet TAKE 1 TABLET BY MOUTH EVERY DAY  . ranitidine (ZANTAC) 150 MG tablet Take 150 mg by mouth 2 (two) times daily as needed for heartburn.  . Suvorexant (BELSOMRA) 20 MG TABS Take 1 tablet by mouth at bedtime.  [name] tiZANidine (ZANAFLEX) 4 MG tablet TAKE 1 TABLET BY MOUTH AT SUPPER AND TAKE 1 TABLET BY MOUTH AT BEDTIME (Patient taking differently: Take 8 mg by mouth at bedtime. )     Allergies: Allergies as of 11/04/2017 - Review Complete 11/04/2017  Allergen Reaction Noted  . Asa [aspirin]  02/06/2016  . Codeine  11/02/2012  . Penicillins  11/02/2012  . Statins Other (See Comments) 04/05/2013  . Sulfa antibiotics  11/02/2012   Past Medical History:  Diagnosis Date  . Anxiety   . Chronic diastolic CHF (congestive heart failure) ([place])    a. echo 09/2014: EF 50-09%, diastolic dysfunction, mild LVH, nl RV size & systolic function, mildly dilated LA (4.3 cm), mild  MR/TR, mildly elevated PASP 36.7 mm Hg  . DDD (degenerative disc disease), cervical   . DDD (degenerative disc disease), lumbar   . Depression   . Diffuse cystic mastopathy 2014  . GERD (gastroesophageal reflux disease)   . Headache    rare  . HLD (hyperlipidemia)    a. statin intolerant 2/2 myalgias  . Hypercholesterolemia   . Hypertension   . Malignant neoplasm of upper-outer quadrant of female breast ([place]) 10/2012   Papillary DCIS, sentinel node negative. DR/PR positive. PARTIAL RIGHT MASTECTOMY FOR BREAST CANCER--HAD RADIATION - NO CHEMO --DR. Hood ONCOLOGIST  . Obesity   . OSA on CPAP   . Osteoarthritis of both knees    a.  s/p right TKA 04/2013 & left TKA 09/2014  . Otitis externa   . Personal history of radiation therapy 2015   RIGHT breast-mammosite per pt  . Sleep difficulties    LUNESTA HAS HELPED  . Vaginal cyst     Family History:  Family History  Problem Relation Age of Onset  . Colon cancer Mother   . Cancer Mother 16       colon cancer  . Melanoma Father   . Cancer Father 66       melanoma  . Colon cancer Maternal Grandfather   . Breast cancer Maternal Grandfather    Social History:  Social History   Socioeconomic History  . Marital status: Widowed    Spouse name: Chrissie Noa  . Number of children: 2  . Years of education: College  . Highest education level: Bachelor's degree (e.g., BA, AB, BS)  Occupational History  . Occupation: Retired  Scientific laboratory technician  . Financial resource strain: Not hard at all  . Food insecurity:    Worry: Never true    Inability: Never true  . Transportation needs:    Medical: No    Non-medical: No  Tobacco Use  . Smoking status: Never Smoker  . Smokeless tobacco: Never Used  . Tobacco comment: social smoker as a teen  Substance and Sexual Activity  . Alcohol use: No  . Drug use: No  . Sexual activity: Not Currently    Birth control/protection: Post-menopausal, Surgical  Lifestyle  . Physical activity:    Days per week: 7 days    Minutes per session: 20 min  . Stress: Only a little  Relationships  . Social connections:    Talks on phone: More than three times a week    Gets together: More than three times a week    Attends religious service: More than 4 times per year    Active member of club or organization: Yes    Attends meetings of clubs or organizations: More than 4 times per year    Relationship status: Widowed  Other Topics Concern  . Not on file  Social History Narrative   Marital status: widowed since 09/16/2015      Children: 2 children (38, 66); 5 grandchild (4 in [place]; 1 in [place]).      Lives: alone in townhome; 1 dog, 2 cats       Employment: psychiatric Education officer, museum; retired in 2015      Tobacco: teenager only      Alcohol:  None      Drugs: none      Exercise: walking in 2019; more active in 2019.  Walking daily small amounts.        ADLs: drives; independent with ADLs; no assistant devices  Advanced Directives: none; FULL CODE; no prolonged measures.   Does not need them.  Daughter/Heather Vista Deck is HCPOA.    Review of Systems: A complete ROS was negative except as per HPI. She has been having headaches and constipation.   Physical Exam: Blood pressure (!) 155/91, pulse 71, temperature 98.9 F (37.2 C), resp. rate (!) 28, SpO2 96 %.  Physical Exam  Constitutional: She appears well-developed and well-nourished.  HENT:  Head: Normocephalic and atraumatic.  rosacea present over bilateral cheeks  Eyes: Conjunctivae are normal.  Cardiovascular: Normal rate, regular rhythm and normal heart sounds.  Respiratory: Effort normal and breath sounds normal. No respiratory distress. She has no wheezes.  GI: Soft. Bowel sounds are normal. She exhibits no distension. There is no tenderness.  Musculoskeletal: She exhibits no edema.  Neurological: She is alert.  Skin: No erythema.  Psychiatric: Her behavior is normal. Judgment and thought content normal. Her mood appears anxious. Her speech is rapid and/or pressured. Cognition and memory are normal.    CBC Latest Ref Rng & Units 11/04/2017 10/02/2017 07/01/2016  WBC 4.0 - 10.5 K/uL 17.1(H) 7.2 7.0  Hemoglobin 12.0 - 15.0 g/dL 11.5(L) 11.7 11.8(A)  Hematocrit 36.0 - 46.0 % 36.0 36.0 34.4(A)  Platelets 150 - 400 K/uL 312 300 -   BMP Latest Ref Rng & Units 11/04/2017 10/02/2017 06/07/2016  Glucose 65 - 99 mg/dL 151(H) 91 93  BUN 6 - 20 mg/dL 10 17 15   Creatinine 0.44 - 1.00 mg/dL 1.08(H) 1.17(H) 1.19(H)  BUN/Creat Ratio 12 - 28 - 15 -  Sodium 135 - 145 mmol/L 124(L) 133(L) 131(L)  Potassium 3.5 - 5.1 mmol/L 3.2(L) 4.6 4.4  Chloride 101 - 111 mmol/L 87(L) 93(L) 94(L)    CO2 22 - 32 mmol/L 22 26 26   Calcium 8.9 - 10.3 mg/dL 8.9 9.8 9.3    EKG: personally reviewed my interpretation is normal sinus rhythm, no st or t wave changes noted  CXR: personally reviewed my interpretation is chronic elevation of right hemidiaphragm and cardiomegaly.  Assessment & Plan by Problem:  Selena Bush is a 72 y.o f with chronic diastolic chf, essential htn, papillary DCIS DR/PR positive s/p partial right mastectomy and radiation, HLD, OSA on cpap presented with atypical chest pain that is being managed with gerd and anxiety medication.   Atypical chest pain The patient's chest pain is likely not cardiac in nature as the pain is continuing for a long duration, troponins have been negative thus far, and no st or t wave changes were present on ekg. She has a low wells score which places her at decreased risk for pulmonary embolism.   The patient also has a history of anxiety disorder which maybe further worsening her chest discomfort.   -Hydroxyzine 25mg  tid prn for anxiety -GI cocktail 30mL TID prn -sucralfate 1g tid with meals and qhs  Chronic diastolic chf The patient appeared euvolemic on exam.    -continue home lasix 20mg  qd -TTE pending -Continue atenolol 50mg  qd  Hyponatremia The patient presented with sodium=124. She has two additional low readings of 131 and 133 over the past 2 years. The patient was euvolemic on exam. She has a high total cholesterol=221 and ldl=132 per lipid profile done in February 2019. Hyperlipidemia maybe a cause of the patients hyponatremia.  Leukocytosis The patient presented with a wbc=17.1. The patient remains afebrile. The leukocytosis is likely reactionary from stress.   Essential Hypertension The patient's blood pressure since admission has ranged 135-169/83-84.   -continued home  lisinopril 20mg  qd and hctz 12.5mg  qd  Anxiety Major Depressive Disorder  -Continue home suvorexant 1 tablet qhs -Hydroxyzine 25mg  tid prn for  anxiety  Dispo: Admit patient to Observation with expected length of stay less than 2 midnights.  SignedLars Mage, MD 11/04/2017, 8:52 AM  Pager: [phone]

## 2017-11-04 NOTE — ED Notes (Signed)
Dr. Bunnie Domino contacted to inform of condition.

## 2017-11-04 NOTE — ED Notes (Signed)
Pt noted as breathing 24 per minute and face appears flushed.c/o being hot. Temp99.9 oral. Taking po fluids and tolerting well. Chest pain 3/10.

## 2017-11-04 NOTE — ED Triage Notes (Signed)
PT updated that Pharmacy needs verify  Med orders and a request has been entered.

## 2017-11-04 NOTE — ED Triage Notes (Signed)
Patient arrived with EMS from home reports intermittent central chest "pressure" onset 9 pm last night , denies SOB , no nausea or diaphoresis . Chest pressure increases when lying , she took Zantac with no relief.

## 2017-11-04 NOTE — ED Notes (Signed)
Pt wakes and states her chest pain continues at 4/10 which she states ios improved. Pt requesting diet. Monitor NSR asnd she describes as high chest low neck and burnign.

## 2017-11-04 NOTE — ED Provider Notes (Addendum)
[facility] EMERGENCY DEPARTMENT Provider Note   CSN: [id number] Arrival date & time: 11/04/17  0555     History   Chief Complaint Chief Complaint  Patient presents with  . Chest Pain    HPI Selena Bush is a 72 y.o. female.  The history is provided by the patient.  She has history of hypertension, hyperlipidemia, chronic diastolic heart failure, GERD and comes in with chest pain which started at 9 PM last night.  She related it to drinking some cola beverages and thought it may have been her GERD so she took 2 doses of ranitidine with no relief.  She describes a dull feeling in her midsternal area without radiation.  There is no associated dyspnea, nausea, diaphoresis.  Pain is worse when she lays down, but nothing seems to make it better.  Pain is rated at 7/10.  She cannot recall having similar pain before.  She is a non-smoker.  She denies history of diabetes.  There is no family history of premature coronary atherosclerosis.  Past Medical History:  Diagnosis Date  . Anxiety   . Chronic diastolic CHF (congestive heart failure) ([place])    a. echo 09/2014: EF 03-47%, diastolic dysfunction, mild LVH, nl RV size & systolic function, mildly dilated LA (4.3 cm), mild MR/TR, mildly elevated PASP 36.7 mm Hg  . DDD (degenerative disc disease), cervical   . DDD (degenerative disc disease), lumbar   . Depression   . Diffuse cystic mastopathy 2014  . GERD (gastroesophageal reflux disease)   . Headache    rare  . HLD (hyperlipidemia)    a. statin intolerant 2/2 myalgias  . Hypercholesterolemia   . Hypertension   . Malignant neoplasm of upper-outer quadrant of female breast ([place]) 10/2012   Papillary DCIS, sentinel node negative. DR/PR positive. PARTIAL RIGHT MASTECTOMY FOR BREAST CANCER--HAD RADIATION - NO CHEMO --DR. [place] ONCOLOGIST  . Obesity   . OSA on CPAP   . Osteoarthritis of both knees    a. s/p right TKA 04/2013 & left TKA 09/2014  . Otitis  externa   . Personal history of radiation therapy 2015   RIGHT breast-mammosite per pt  . Sleep difficulties    LUNESTA HAS HELPED  . Vaginal cyst     Patient Active Problem List   Diagnosis Date Noted  . Anxiety 03/21/2016  . Insomnia 07/17/2015  . Hearing loss 03/09/2015  . Hypokalemia 01/21/2015  . MI (mitral incompetence) 12/10/2014  . Depression 12/09/2014  . Sleep apnea 12/09/2014  . GERD (gastroesophageal reflux disease) 12/09/2014  . Hyperglycemia 12/09/2014  . Chronic diastolic CHF (congestive heart failure) ([place])   . HLD (hyperlipidemia)   . Osteoarthritis of both knees   . S/P left TKA 08/19/2014  . Morbid obesity ([place]) 04/17/2013  . Hyponatremia 04/17/2013  . DCIS (ductal carcinoma in situ) 11/05/2012  . Essential hypertension, benign 11/05/2012    Past Surgical History:  Procedure Laterality Date  . ABDOMINAL HYSTERECTOMY  1992   DUB; fibroids; endometriosis.  One remaining ovary.    [name] BREAST LUMPECTOMY Right 2015   Papillary DCIS, sentinel node negative. DR/PR positive. PARTIAL RIGHT MASTECTOMY FOR BREAST CANCER--HAD RADIATION - NO CHEMO --DR. [place] ONCOLOGIST  . BREAST SURGERY Right March 2014   Wide excision,APB RT 10 mm papillary DCIS, ER/PR positive. Sentinel node negative. Partial breast radiation.  [name] CATARACT EXTRACTION, BILATERAL  02/13/2016   [place].  . CHOLECYSTECTOMY  1994  . COLONOSCOPY  2015   1 benign  polyp-every 5 years/ Dr Candace Cruise  . ERCP  1995  . JOINT REPLACEMENT Right Sept 2014   knee  . TOTAL KNEE ARTHROPLASTY Right 04/16/2013   Procedure: RIGHT TOTAL KNEE ARTHROPLASTY;  Surgeon: Mauri Pole, MD;  Location: [facility];  Service: Orthopedics;  Laterality: Right;  . TOTAL KNEE ARTHROPLASTY Left 09/15/2014   Procedure: LEFT TOTAL KNEE ARTHROPLASTY;  Surgeon: Mauri Pole, MD;  Location: [facility];  Service: Orthopedics;  Laterality: Left;  . TUBAL LIGATION  1979     OB History    Gravida  2   Para  2   Term      Preterm       AB      Living  2     SAB      TAB      Ectopic      Multiple      Live Births           Obstetric Comments  First pregnancy 22 First menstrual 15         Home Medications    Prior to Admission medications   Medication Sig Start Date End Date Taking? Authorizing Provider  atenolol (TENORMIN) 50 MG tablet Take 1 tablet (50 mg total) by mouth daily. 10/11/17   Wardell Honour, MD  clindamycin (CLEOCIN) 300 MG capsule TK 2 CS PO 1 HOUR PRIOR TO TREATMENT/PRN 02/23/15   [provider]  colchicine 0.6 MG tablet Take 1 tablet (0.6 mg total) by mouth 2 (two) times daily. 10/11/17   Wardell Honour, MD  DULoxetine (CYMBALTA) 60 MG capsule Take 2 capsules (120 mg total) by mouth daily. 10/11/17   Wardell Honour, MD  esomeprazole (NEXIUM) 40 MG capsule Take 1 capsule (40 mg total) by mouth every morning. 10/11/17   Wardell Honour, MD  fluticasone Asencion Islam) 50 MCG/ACT nasal spray Place 2 sprays into both nostrils daily as needed for rhinitis or allergies. 10/11/17   Wardell Honour, MD  furosemide (LASIX) 20 MG tablet Take 1 tablet (20 mg total) by mouth daily. 10/11/17   Wardell Honour, MD  lisinopril-hydrochlorothiazide (PRINZIDE,ZESTORETIC) 20-12.5 MG tablet Take 1 tablet by mouth daily. 10/11/17   Wardell Honour, MD  raloxifene (EVISTA) 60 MG tablet TAKE 1 TABLET BY MOUTH EVERY DAY 04/21/17   Byrnett, Forest Gleason, MD  Suvorexant (BELSOMRA) 20 MG TABS Take 1 tablet by mouth at bedtime. 10/11/17   Wardell Honour, MD  tiZANidine (ZANAFLEX) 4 MG tablet TAKE 1 TABLET BY MOUTH AT SUPPER AND TAKE 1 TABLET BY MOUTH AT BEDTIME 10/11/17   Wardell Honour, MD    Family History Family History  Problem Relation Age of Onset  . Colon cancer Mother   . Cancer Mother 67       colon cancer  . Melanoma Father   . Cancer Father 70       melanoma  . Colon cancer Maternal Grandfather   . Breast cancer Maternal Grandfather     Social History Social History   Tobacco Use  . Smoking  status: Never Smoker  . Smokeless tobacco: Never Used  . Tobacco comment: social smoker as a teen  Substance Use Topics  . Alcohol use: No  . Drug use: No     Allergies   Asa [aspirin]; Codeine; Penicillins; Statins; and Sulfa antibiotics   Review of Systems Review of Systems  All other systems reviewed and are negative.    Physical Exam Updated Vital  Signs BP (!) 157/90 (BP Location: Left Arm)   Pulse 78   Temp 98.9 F (37.2 C)   Resp (!) 24   SpO2 96%   Physical Exam  Nursing note and vitals reviewed.  72 year old female, resting comfortably and in no acute distress. Vital signs are significant for elevated systolic blood pressure and elevated respiratory rate. Oxygen saturation is 96%, which is normal. Head is normocephalic and atraumatic. PERRLA, EOMI. Oropharynx is clear. Neck is nontender and supple without adenopathy or JVD. Back is nontender and there is no CVA tenderness. Lungs are clear without rales, wheezes, or rhonchi. Chest is nontender. Heart has regular rate and rhythm without murmur. Abdomen is soft, flat, with mild to moderate epigastric tenderness.  There is no rebound or guarding.  There are no masses or hepatosplenomegaly and peristalsis is normoactive. Extremities have no cyanosis or edema, full range of motion is present. Skin is warm and dry without rash. Neurologic: Mental status is normal, cranial nerves are intact, there are no motor or sensory deficits.  ED Treatments / Results  Labs (all labs ordered are listed, but only abnormal results are displayed) Labs Reviewed  BASIC METABOLIC PANEL - Abnormal; Notable for the following components:      Result Value   Sodium 124 (*)    Potassium 3.2 (*)    Chloride 87 (*)    Glucose, Bld 151 (*)    Creatinine, Ser 1.08 (*)    GFR calc non Af Amer 51 (*)    GFR calc Af Amer 59 (*)    All other components within normal limits  CBC - Abnormal; Notable for the following components:   WBC 17.1  (*)    Hemoglobin 11.5 (*)    All other components within normal limits  I-STAT TROPONIN, ED    EKG EKG Interpretation  Date/Time:  Saturday November 04 2017 06:47:28 EDT Ventricular Rate:  82 PR Interval:    QRS Duration: 90 QT Interval:  373 QTC Calculation: 436 R Axis:   -9 Text Interpretation:  Sinus rhythm Nonspecific T wave abnormality When compared with ECG of 09/20/2014, No significant change was found Confirmed by Delora Fuel ([id number]) on 11/04/2017 6:49:39 AM   Radiology Dg Chest 2 View  Result Date: 11/04/2017 CLINICAL DATA:  Chest pain/pressure. EXAM: CHEST - 2 VIEW COMPARISON:  Radiographs and CT 09/20/2014 FINDINGS: Chronic elevation of right hemidiaphragm with adjacent passive atelectasis. Mild cardiomegaly which is unchanged from prior. Left midlung scarring. No confluent airspace disease. No pleural effusion or pneumothorax. IMPRESSION: 1. No acute abnormality. 2. Chronic elevation of right hemidiaphragm. Unchanged cardiomegaly. 3. Left midlung scarring. Electronically Signed   By: Jeb Levering M.D.   On: 11/04/2017 06:55    Procedures Procedures   Medications Ordered in ED Medications  gi cocktail (Maalox,Lidocaine,Donnatal) (30 mLs Oral Given 11/04/17 0656)  potassium chloride SA (K-DUR,KLOR-CON) CR tablet 40 mEq (40 mEq Oral Given 11/04/17 0656)     Initial Impression / Assessment and Plan / ED Course  I have reviewed the triage vital signs and the nursing notes.  Pertinent labs & imaging results that were available during my care of the patient were reviewed by me and considered in my medical decision making (see chart for details).  Chest pain which seems quite atypical for ACS.  Probable GERD given epigastric tenderness and pain being worse when lying supine.  No acute ECG changes seen in initial troponin is normal.  No risk factors for pulmonary embolism.  No  cough to suggest pneumonia, and chest x-ray is clear.  Old records are reviewed, and she has no  relevant past visits.  Heart score is 4, which puts her at moderate risk for major adverse cardiac events in the next 30 days.  She will be given a therapeutic trial of the GI cocktail.  She had slight relief of pain with above-noted treatment.  Laboratory workup shows moderate hyponatremia and mild hypokalemia.  Both of these are likely related to hydrochlorothiazide that she takes for blood pressure.  She was given some IV saline and oral potassium.  Aspirin is not given because of allergy.  She is given morphine for pain.  She will need to be admitted for serial troponins.  Hospitalist has been paged.  Final Clinical Impressions(s) / ED Diagnoses   Final diagnoses:  Chest pain, unspecified type  Diuretic-induced hypokalemia  Hyponatremia    ED Discharge Orders    None       Delora Fuel, MD 22/97/98 9211    Delora Fuel, MD 94/17/40 [phone]

## 2017-11-04 NOTE — ED Provider Notes (Signed)
Signout from Dr. Roxanne Mins.  72 year old female with chest pain elevated heart score and hyponatremia anticipated for admission.  Awaiting callback from admitting team.  I received a call back from the internal medicine team and the patient is accepted to their service under Dr. Rebeca Alert.   Hayden Rasmussen, MD 11/04/17 [phone]

## 2017-11-04 NOTE — ED Notes (Signed)
Notified edp istat trop results.

## 2017-11-05 ENCOUNTER — Observation Stay (HOSPITAL_BASED_OUTPATIENT_CLINIC_OR_DEPARTMENT_OTHER): Payer: PPO

## 2017-11-05 DIAGNOSIS — F339 Major depressive disorder, recurrent, unspecified: Secondary | ICD-10-CM

## 2017-11-05 DIAGNOSIS — F330 Major depressive disorder, recurrent, mild: Secondary | ICD-10-CM | POA: Diagnosis not present

## 2017-11-05 DIAGNOSIS — Z923 Personal history of irradiation: Secondary | ICD-10-CM | POA: Diagnosis not present

## 2017-11-05 DIAGNOSIS — I5032 Chronic diastolic (congestive) heart failure: Secondary | ICD-10-CM | POA: Diagnosis present

## 2017-11-05 DIAGNOSIS — D72829 Elevated white blood cell count, unspecified: Secondary | ICD-10-CM | POA: Diagnosis not present

## 2017-11-05 DIAGNOSIS — Z9989 Dependence on other enabling machines and devices: Secondary | ICD-10-CM

## 2017-11-05 DIAGNOSIS — Z803 Family history of malignant neoplasm of breast: Secondary | ICD-10-CM | POA: Diagnosis not present

## 2017-11-05 DIAGNOSIS — Z86000 Personal history of in-situ neoplasm of breast: Secondary | ICD-10-CM | POA: Diagnosis not present

## 2017-11-05 DIAGNOSIS — I480 Paroxysmal atrial fibrillation: Secondary | ICD-10-CM | POA: Diagnosis present

## 2017-11-05 DIAGNOSIS — Z96653 Presence of artificial knee joint, bilateral: Secondary | ICD-10-CM | POA: Diagnosis present

## 2017-11-05 DIAGNOSIS — F419 Anxiety disorder, unspecified: Secondary | ICD-10-CM | POA: Diagnosis present

## 2017-11-05 DIAGNOSIS — R0601 Orthopnea: Secondary | ICD-10-CM | POA: Diagnosis not present

## 2017-11-05 DIAGNOSIS — Z6841 Body Mass Index (BMI) 40.0 and over, adult: Secondary | ICD-10-CM | POA: Diagnosis not present

## 2017-11-05 DIAGNOSIS — T502X5A Adverse effect of carbonic-anhydrase inhibitors, benzothiadiazides and other diuretics, initial encounter: Secondary | ICD-10-CM | POA: Diagnosis present

## 2017-11-05 DIAGNOSIS — Z9049 Acquired absence of other specified parts of digestive tract: Secondary | ICD-10-CM | POA: Diagnosis not present

## 2017-11-05 DIAGNOSIS — R0600 Dyspnea, unspecified: Secondary | ICD-10-CM | POA: Diagnosis not present

## 2017-11-05 DIAGNOSIS — K219 Gastro-esophageal reflux disease without esophagitis: Secondary | ICD-10-CM | POA: Diagnosis present

## 2017-11-05 DIAGNOSIS — I314 Cardiac tamponade: Secondary | ICD-10-CM | POA: Diagnosis not present

## 2017-11-05 DIAGNOSIS — Z9011 Acquired absence of right breast and nipple: Secondary | ICD-10-CM | POA: Diagnosis not present

## 2017-11-05 DIAGNOSIS — I340 Nonrheumatic mitral (valve) insufficiency: Secondary | ICD-10-CM | POA: Diagnosis not present

## 2017-11-05 DIAGNOSIS — F329 Major depressive disorder, single episode, unspecified: Secondary | ICD-10-CM | POA: Diagnosis present

## 2017-11-05 DIAGNOSIS — G4733 Obstructive sleep apnea (adult) (pediatric): Secondary | ICD-10-CM | POA: Diagnosis present

## 2017-11-05 DIAGNOSIS — E871 Hypo-osmolality and hyponatremia: Secondary | ICD-10-CM | POA: Diagnosis not present

## 2017-11-05 DIAGNOSIS — I110 Hypertensive heart disease with heart failure: Secondary | ICD-10-CM | POA: Diagnosis not present

## 2017-11-05 DIAGNOSIS — E7800 Pure hypercholesterolemia, unspecified: Secondary | ICD-10-CM | POA: Diagnosis present

## 2017-11-05 DIAGNOSIS — Z800 Family history of malignant neoplasm of digestive organs: Secondary | ICD-10-CM | POA: Diagnosis not present

## 2017-11-05 DIAGNOSIS — E785 Hyperlipidemia, unspecified: Secondary | ICD-10-CM | POA: Diagnosis present

## 2017-11-05 DIAGNOSIS — Z90710 Acquired absence of both cervix and uterus: Secondary | ICD-10-CM | POA: Diagnosis not present

## 2017-11-05 DIAGNOSIS — E876 Hypokalemia: Secondary | ICD-10-CM | POA: Diagnosis present

## 2017-11-05 DIAGNOSIS — I959 Hypotension, unspecified: Secondary | ICD-10-CM | POA: Diagnosis present

## 2017-11-05 DIAGNOSIS — I5030 Unspecified diastolic (congestive) heart failure: Secondary | ICD-10-CM | POA: Diagnosis not present

## 2017-11-05 DIAGNOSIS — I4891 Unspecified atrial fibrillation: Secondary | ICD-10-CM | POA: Diagnosis not present

## 2017-11-05 DIAGNOSIS — E222 Syndrome of inappropriate secretion of antidiuretic hormone: Secondary | ICD-10-CM | POA: Diagnosis present

## 2017-11-05 DIAGNOSIS — D72828 Other elevated white blood cell count: Secondary | ICD-10-CM | POA: Diagnosis present

## 2017-11-05 DIAGNOSIS — I472 Ventricular tachycardia: Secondary | ICD-10-CM | POA: Diagnosis not present

## 2017-11-05 DIAGNOSIS — R0789 Other chest pain: Secondary | ICD-10-CM | POA: Diagnosis present

## 2017-11-05 DIAGNOSIS — Z79899 Other long term (current) drug therapy: Secondary | ICD-10-CM

## 2017-11-05 DIAGNOSIS — I309 Acute pericarditis, unspecified: Secondary | ICD-10-CM | POA: Diagnosis not present

## 2017-11-05 DIAGNOSIS — Z853 Personal history of malignant neoplasm of breast: Secondary | ICD-10-CM | POA: Diagnosis not present

## 2017-11-05 LAB — COMPREHENSIVE METABOLIC PANEL
ALT: 10 U/L — AB (ref 14–54)
AST: 17 U/L (ref 15–41)
Albumin: 3.2 g/dL — ABNORMAL LOW (ref 3.5–5.0)
Alkaline Phosphatase: 66 U/L (ref 38–126)
Anion gap: 14 (ref 5–15)
BUN: 11 mg/dL (ref 6–20)
CHLORIDE: 91 mmol/L — AB (ref 101–111)
CO2: 23 mmol/L (ref 22–32)
CREATININE: 1.24 mg/dL — AB (ref 0.44–1.00)
Calcium: 9.1 mg/dL (ref 8.9–10.3)
GFR calc Af Amer: 50 mL/min — ABNORMAL LOW (ref >60)
GFR calc non Af Amer: 43 mL/min — ABNORMAL LOW (ref >60)
Glucose, Bld: 144 mg/dL — ABNORMAL HIGH (ref 65–99)
Potassium: 3.2 mmol/L — ABNORMAL LOW (ref 3.5–5.1)
Sodium: 128 mmol/L — ABNORMAL LOW (ref 135–145)
Total Bilirubin: 1.1 mg/dL (ref 0.3–1.2)
Total Protein: 6.4 g/dL — ABNORMAL LOW (ref 6.5–8.1)

## 2017-11-05 LAB — CBC
HCT: 33.9 % — ABNORMAL LOW (ref 36.0–46.0)
Hemoglobin: 11.1 g/dL — ABNORMAL LOW (ref 12.0–15.0)
MCH: 27.3 pg (ref 26.0–34.0)
MCHC: 32.7 g/dL (ref 30.0–36.0)
MCV: 83.5 fL (ref 78.0–100.0)
PLATELETS: 252 10*3/uL (ref 150–400)
RBC: 4.06 MIL/uL (ref 3.87–5.11)
RDW: 13.7 % (ref 11.5–15.5)
WBC: 12 10*3/uL — ABNORMAL HIGH (ref 4.0–10.5)

## 2017-11-05 LAB — ECHOCARDIOGRAM COMPLETE
Height: 62 in
Weight: 3636.71 oz

## 2017-11-05 LAB — CK TOTAL AND CKMB (NOT AT ARMC)
CK, MB: 1.4 ng/mL (ref 0.5–5.0)
Relative Index: INVALID (ref 0.0–2.5)
Total CK: 76 U/L (ref 38–234)

## 2017-11-05 LAB — TROPONIN I

## 2017-11-05 LAB — OSMOLALITY: Osmolality: 267 mOsm/kg — ABNORMAL LOW (ref 275–295)

## 2017-11-05 MED ORDER — METOPROLOL TARTRATE 5 MG/5ML IV SOLN: 5.0000 mg | Freq: Once | INTRAVENOUS | Status: DC

## 2017-11-05 MED ORDER — METOPROLOL TARTRATE 50 MG PO TABS
50.0000 mg | ORAL_TABLET | Freq: Two times a day (BID) | ORAL | Status: DC
  Administered 2017-11-05 – 2017-11-06 (×3): 50 mg via ORAL
  Filled 2017-11-05 (×3): qty 1

## 2017-11-05 MED ORDER — DILTIAZEM HCL 100 MG IV SOLR
5.0000 mg/h | INTRAVENOUS | Status: AC
  Administered 2017-11-05: 15 mg/h via INTRAVENOUS
  Administered 2017-11-05: 5 mg/h via INTRAVENOUS
  Administered 2017-11-05: 10 mg/h via INTRAVENOUS
  Administered 2017-11-06: 5 mg/h via INTRAVENOUS
  Administered 2017-11-06 – 2017-11-07 (×3): 15 mg/h via INTRAVENOUS
  Administered 2017-11-07: 5 mg/h via INTRAVENOUS
  Filled 2017-11-05 (×8): qty 100

## 2017-11-05 MED ORDER — POTASSIUM CHLORIDE CRYS ER 20 MEQ PO TBCR
40.0000 meq | EXTENDED_RELEASE_TABLET | ORAL | Status: AC
  Administered 2017-11-05 (×2): 40 meq via ORAL
  Filled 2017-11-05 (×2): qty 2

## 2017-11-05 MED ORDER — DILTIAZEM LOAD VIA INFUSION
20.0000 mg | Freq: Once | INTRAVENOUS | Status: AC
  Administered 2017-11-05: 20 mg via INTRAVENOUS
  Filled 2017-11-05: qty 20

## 2017-11-05 MED ORDER — RIVAROXABAN 20 MG PO TABS
20.0000 mg | ORAL_TABLET | Freq: Every day | ORAL | Status: DC
  Administered 2017-11-05 – 2017-11-09 (×5): 20 mg via ORAL
  Filled 2017-11-05 (×5): qty 1

## 2017-11-05 MED ORDER — METOPROLOL TARTRATE 25 MG PO TABS: 25.0000 mg | ORAL_TABLET | Freq: Two times a day (BID) | ORAL | Status: DC

## 2017-11-05 MED ORDER — METOPROLOL TARTRATE 5 MG/5ML IV SOLN
INTRAVENOUS | Status: AC
  Administered 2017-11-05: 5 mg via NASAL
  Filled 2017-11-05: qty 5

## 2017-11-05 MED ORDER — METOPROLOL TARTRATE 5 MG/5ML IV SOLN
5.0000 mg | INTRAVENOUS | Status: DC | PRN
  Administered 2017-11-05 (×2): 5 mg via INTRAVENOUS
  Filled 2017-11-05 (×2): qty 5

## 2017-11-05 NOTE — Progress Notes (Signed)
At 330am patient converted to afib with rvr.EKG obtained veryfiying afib with rvr. Labs obtained CKMB and Troponin. GI cocktail given as per pt request. IM paged.

## 2017-11-05 NOTE — Progress Notes (Signed)
  Date: 11/05/2017  Patient name: Selena Bush  Medical record number: [id number]  Date of birth: May 29, 1946   I have seen and evaluated this patient and I have discussed the plan of care with the house staff. Please see their note for complete details. I concur with their findings with the following additions/corrections:   72 year old woman with a history of HFpEF, HTN, DCIS, HLD, and OSA on CPAP who presented with atypical chest pain. The pain radiated to her chin, but was not associated with exertion, diaphoresis, or dizziness. She actually attributed the chest pain to her heartburn, although it did not improve significantly after taking Zantac.Her EKG shows no signs of ischemia and her troponin has been negative 4.  Overnight, she developed narrow complex tachycardia. Review of telemetry and EKG suggests atrial fibrillation versus atrial flutter. She received 2 doses of metoprolol IV 5 mg, and then was started on a diltiazem drip. Today, her heart rate remains in the 130s.  Regarding her chest pain, it was atypical for ACS and she has had multiple negative troponins and negative EKG. No further workup is necessary at this point, and her chest pain is improved with GI cocktail.  She does not appear to have a history of atrial fibrillation, so it is unclear what may have triggered this. We will obtain an echocardiogram to look for structural etiologies. She has a normal TSH.We will try to control her rate better than we have so far. As she is asymptomatic with the current A. fib, it is entirely possible she has had previous episodes at home that she did not detect. As a result, I would be reluctant to pursue cardioversion without possible TEE. It is also possible she will self convert in the next day or 2.  We will discuss with her anticoagulation, but based on her chads and has bled scores, she likely would be a candidate for anticoagulation.  Lenice Pressman, M.D., Ph.D. 11/05/2017,  3:05 PM

## 2017-11-05 NOTE — Progress Notes (Signed)
ANTICOAGULATION CONSULT NOTE - Initial Consult  Pharmacy Consult for Xarelto Indication: atrial fibrillation  Allergies  Allergen Reactions  . Asa [Aspirin]   . Codeine     Gi problems   . Penicillins     anaphylaxis    . Statins Other (See Comments)    Leg pains  . Sulfa Antibiotics     anaphylaxis     Patient Measurements: Height: 5\' 2"  (157.5 cm) Weight: 227 lb 4.7 oz (103.1 kg) IBW/kg (Calculated) : 50.1  Vital Signs: Temp: 97.4 F (36.3 C) (03/24 1324) Temp Source: Oral (03/24 1324) BP: 101/74 (03/24 1324) Pulse Rate: 136 (03/24 1324)  Labs: Recent Labs    11/04/17 0604 11/04/17 1005 11/04/17 1908 11/04/17 2246 11/05/17 0354  HGB 11.5*  --   --   --  11.1*  HCT 36.0  --   --   --  33.9*  PLT 312  --   --   --  252  CREATININE 1.08*  --   --   --  1.24*  CKTOTAL  --   --   --   --  76  CKMB  --   --   --   --  1.4  TROPONINI  --  <0.03 <0.03 <0.03  --     Estimated Creatinine Clearance: 48.2 mL/min (A) (by C-G formula based on SCr of 1.24 mg/dL (H)).   Medical History: Past Medical History:  Diagnosis Date  . Anxiety   . Chronic diastolic CHF (congestive heart failure) ([place])    a. echo 09/2014: EF 16-07%, diastolic dysfunction, mild LVH, nl RV size & systolic function, mildly dilated LA (4.3 cm), mild MR/TR, mildly elevated PASP 36.7 mm Hg  . DDD (degenerative disc disease), cervical   . DDD (degenerative disc disease), lumbar   . Depression   . Diffuse cystic mastopathy 2014  . GERD (gastroesophageal reflux disease)   . Headache    rare  . HLD (hyperlipidemia)    a. statin intolerant 2/2 myalgias  . Hypercholesterolemia   . Hypertension   . Malignant neoplasm of upper-outer quadrant of female breast ([place]) 10/2012   Papillary DCIS, sentinel node negative. DR/PR positive. PARTIAL RIGHT MASTECTOMY FOR BREAST CANCER--HAD RADIATION - NO CHEMO --DR. [place] ONCOLOGIST  . Obesity   . OSA on CPAP   . Osteoarthritis of both knees    a.  s/p right TKA 04/2013 & left TKA 09/2014  . Otitis externa   . Personal history of radiation therapy 2015   RIGHT breast-mammosite per pt  . Sleep difficulties    LUNESTA HAS HELPED  . Vaginal cyst     Medications:  Scheduled:  . feeding supplement (ENSURE ENLIVE)  237 mL Oral BID BM  . furosemide  20 mg Oral Daily  . lisinopril  20 mg Oral Daily  . metoprolol tartrate  50 mg Oral BID  . pantoprazole  40 mg Oral Daily  . raloxifene  60 mg Oral Daily  . rivaroxaban  20 mg Oral Q supper  . sertraline  50 mg Oral Daily  . sucralfate  1 g Oral TID WC & HS  . Suvorexant  1 tablet Oral QHS    Assessment: 33 yof found to be in new onset atrial fibrillation overnight - not on anticoag PTA. CHADsVASc score is 3. Now on diltiazem infusion. Received enoxaparin for DVT ppx - last dose on 3/24 at 0900.  Hgb stable at 11.1, plts WNL. No signs/symptoms of bleeding. Scr 1.24 (CrCl  using TBW 69 mL/min).   Goal of Therapy:  Monitor platelets by anticoagulation protocol: Yes   Plan:  Xarelto 20 mg daily starting tonight Monitor CBC and renal function Monitor for signs/symptoms of bleeding  Doylene Canard, PharmD Clinical Pharmacist  Pager: [phone] Clinical Phone for 11/05/2017 until 3:30pm: [phone] If after 3:30pm, please call main pharmacy at [phone] 11/05/2017,2:22 PM

## 2017-11-05 NOTE — Progress Notes (Addendum)
  Echocardiogram 2D Echocardiogram has been performed.  Spoke to Dr. Meda Coffee regarding HR.  Laurette Villescas L Androw 11/05/2017, 2:03 PM

## 2017-11-05 NOTE — Progress Notes (Addendum)
   Subjective:  The patient was extremely anxious when the team went to examine her. She stated that she was concerned about the abnormal heart rhythm. She also did not like being confined to her bed and states that she is not able to have bowel movements if she does not sit on the commode.   Overnight the patient converted to afib with rvr  Objective:  Vital signs in last 24 hours: Vitals:   11/04/17 1530 11/04/17 2235 11/04/17 2300 11/05/17 0400  BP: (!) 155/84 (!) 151/80    Pulse: 86 75    Resp: (!) 24  20   Temp: 99.2 F (37.3 C) 98.2 F (36.8 C)    TempSrc: Oral Oral    SpO2: 100% 95%    Weight: 226 lb 6.6 oz (102.7 kg)   227 lb 4.7 oz (103.1 kg)  Height: 5\' 2"  (1.575 m)      Physical Exam  Constitutional: She appears well-developed and well-nourished. No distress.  HENT:  Head: Normocephalic and atraumatic.  Eyes: Conjunctivae are normal.  Cardiovascular: Normal rate and normal heart sounds. An irregularly irregular rhythm present.  Respiratory: Effort normal and breath sounds normal. No respiratory distress. She has no wheezes.  GI: Soft. Bowel sounds are normal. She exhibits no distension. There is no tenderness.  Musculoskeletal: She exhibits edema (1+ pitting edema in extremities).  Neurological: She is alert.  Skin: She is not diaphoretic. No erythema.  Psychiatric: She has a normal mood and affect. Her behavior is normal. Judgment and thought content normal.   Assessment/Plan:  Ms Blinder is a 72 y.o f with chronic diastolic chf, essential htn,papillaryDCISDR/PR positive s/p partial right mastectomy and radiation,HLD, OSA on cpap presented with atypical chest pain that is being managed with gerd and anxiety medication.   Atypical chest pain The patient's troponin was negative. There continues to be low suspicion for cardiac related chest pain. He is being treated for GI and psychological causes for his chest pain.   -continue Hydroxyzine 25mg  tid prn for  anxiety -continue GI cocktail 38mL TID prn -sucralfate 1g tid with meals and qhs  Atrial fibrillation with rvr The patient converted into atrial fibrillation last night which she has not had in the past. The patient's CHADSVASC score is 3 which places her at a 5.9% risk of having a 70% thromboembolic event if not on anticoagulant. She is at risk for atrial fibrillation secondary to age, chf, and osa.   -started xarelto today 11/05/17 -Diltiazem drip -po metoprolol 50mg  bid  -IV metoprolol 5mg  q73min prn  Chronic diastolic chf The patient appeared mildly hypervolemic on exam today. The patient put out 959mL of urine over the past 24 hrs.   -continue home lasix 20mg  qd -TTE pending -Continue atenolol 50mg  qd  Hyponatremia The patient presented with sodium=124 today her sodium is at 128. The aptient's serum osmolality is low at 267 which means that the patient maybe having hypervolemic hyponatremia.   Leukocytosis The patient's leukocytosis appears to have resovled today from 17 to 12. The leukocytosis is likely reactionary from stress.   Essential Hypertension The patient's blood pressure this morning is 151/80.   -continued home lisinopril 20mg  qd and hctz 12.5mg  qd  Anxiety Major Depressive Disorder  -Continue home suvorexant 1 tablet qhs -Hydroxyzine 25mg  tid prn for anxiety  OSA -cpap nightly  Dispo: Anticipated discharge in approximately 1-2 day(s).   Lars Mage, MD 11/05/2017, 5:33 AM Pager: [phone]

## 2017-11-06 LAB — BASIC METABOLIC PANEL
Anion gap: 7 (ref 5–15)
BUN: 16 mg/dL (ref 6–20)
CO2: 25 mmol/L (ref 22–32)
Calcium: 8.6 mg/dL — ABNORMAL LOW (ref 8.9–10.3)
Chloride: 94 mmol/L — ABNORMAL LOW (ref 101–111)
Creatinine, Ser: 1.55 mg/dL — ABNORMAL HIGH (ref 0.44–1.00)
GFR calc Af Amer: 38 mL/min — ABNORMAL LOW (ref >60)
GFR calc non Af Amer: 33 mL/min — ABNORMAL LOW (ref >60)
Glucose, Bld: 124 mg/dL — ABNORMAL HIGH (ref 65–99)
Potassium: 4.3 mmol/L (ref 3.5–5.1)
Sodium: 126 mmol/L — ABNORMAL LOW (ref 135–145)

## 2017-11-06 LAB — CREATININE, URINE, RANDOM: Creatinine, Urine: 262.36 mg/dL

## 2017-11-06 LAB — OSMOLALITY: Osmolality: 273 mOsm/kg — ABNORMAL LOW (ref 275–295)

## 2017-11-06 LAB — OSMOLALITY, URINE: Osmolality, Ur: 494 mOsm/kg (ref 300–900)

## 2017-11-06 LAB — SODIUM, URINE, RANDOM: Sodium, Ur: 17 mmol/L

## 2017-11-06 MED ORDER — METOPROLOL TARTRATE 50 MG PO TABS
50.0000 mg | ORAL_TABLET | Freq: Two times a day (BID) | ORAL | Status: DC
  Administered 2017-11-07 – 2017-11-09 (×5): 50 mg via ORAL
  Filled 2017-11-06 (×5): qty 1

## 2017-11-06 MED ORDER — AMIODARONE IV BOLUS ONLY 150 MG/100ML
150.0000 mg | Freq: Once | INTRAVENOUS | Status: AC
  Administered 2017-11-06: 150 mg via INTRAVENOUS
  Filled 2017-11-06: qty 100

## 2017-11-06 MED ORDER — METOPROLOL TARTRATE 5 MG/5ML IV SOLN
5.0000 mg | INTRAVENOUS | Status: AC | PRN
  Administered 2017-11-06 (×2): 5 mg via INTRAVENOUS
  Filled 2017-11-06 (×2): qty 5

## 2017-11-06 MED ORDER — FUROSEMIDE 40 MG PO TABS
40.0000 mg | ORAL_TABLET | Freq: Every day | ORAL | Status: DC
  Administered 2017-11-07 – 2017-11-10 (×4): 40 mg via ORAL
  Filled 2017-11-06 (×4): qty 1

## 2017-11-06 MED ORDER — FUROSEMIDE 10 MG/ML IJ SOLN: 40.0000 mg | Freq: Once | INTRAMUSCULAR | Status: DC

## 2017-11-06 MED ORDER — METOPROLOL TARTRATE 50 MG PO TABS
75.0000 mg | ORAL_TABLET | Freq: Once | ORAL | Status: AC
  Administered 2017-11-06: 75 mg via ORAL
  Filled 2017-11-06: qty 1

## 2017-11-06 NOTE — Progress Notes (Signed)
Patient came off of cardizem drip at 2015 on 11/05/2017.  Patient maintained NSR this shift.  Safety and comfort measures maintained.  Call bell within reach.

## 2017-11-06 NOTE — Plan of Care (Signed)
Patient is very interested in her plan of care and interacts well.  She is progressing in careplans.

## 2017-11-06 NOTE — Progress Notes (Signed)
  Date: 11/06/2017  Patient name: SHRISTI SCHEIB  Medical record number: [id number]  Date of birth: Dec 10, 1945   I have seen and evaluated this patient and I have discussed the plan of care with the house staff. Please see their note for complete details. I concur with their findings with the following additions/corrections:   Unfortunately, back in A fib with RVR last night after spontaneous conversion back to NSR yesterday. Started on diltiazem drip, still in RVR this morning on rounds, gave PO metoprolol and RN titrating diltiazem drip. Had brief episode of hypotension overnight prior to A fib, BP normal to high all day. Will continue to work on rate control, have started on anticoagulation.   TTE yesterday showed normal EF with LVH and mild MR and mild TR, mild LA dilation. Unable to assess diastolic function.  Lenice Pressman, M.D., Ph.D. 11/06/2017, 10:13 PM

## 2017-11-06 NOTE — Progress Notes (Addendum)
Initial Nutrition Assessment  DOCUMENTATION CODES:   Morbid obesity  INTERVENTION:    Continue Regular diet  NUTRITION DIAGNOSIS:   Inadequate oral intake related to poor appetite as evidenced by per patient/family report  GOAL:   Patient will meet greater than or equal to 90% of their needs  MONITOR:   PO intake, Labs, Skin, Weight trends, I & O's  REASON FOR ASSESSMENT:   Malnutrition Screening Tool  ASSESSMENT:   72 yo Female with PMH of HTN, CHF, papillary DCIS DR/PR positive s/p partial right mastectomy and radiation, HLD, OSA on CPAP who presented with chest pain.  RD spoke with pt at bedside. She reports a poor appetite. Does not care for her Heart Healthy diet. Pt does not understand why she cannot eat foods that she does at home.  She does not like Ensure Enlive and/or Boost Plus nutrition supplements. Pt also reveals she was trying to lose weight PTA with diet modifications.  Labs and medications reviewed. Na 126 (L).  Spoke with Dr. Maricela Bo regarding diet liberalization.  Dr. Maricela Bo changed to Regular with 1200 ml fluid restriction 1307. Also briefly spoke with pt's son outside of pt's room.  NUTRITION - FOCUSED PHYSICAL EXAM:    Most Recent Value  Orbital Region  No depletion  Upper Arm Region  No depletion  Thoracic and Lumbar Region  No depletion  Buccal Region  No depletion  Temple Region  No depletion  Clavicle Bone Region  No depletion  Clavicle and Acromion Bone Region  No depletion  Scapular Bone Region  No depletion  Dorsal Hand  No depletion  Patellar Region  No depletion  Anterior Thigh Region  No depletion  Posterior Calf Region  No depletion  Edema (RD Assessment)  None    Diet Order:  Diet regular Room service appropriate? Yes; Fluid consistency: Thin; Fluid restriction: 1200 mL Fluid  EDUCATION NEEDS:   No education needs have been identified at this time  Skin:  Skin Assessment: Reviewed RN Assessment  Last BM:  3/25    Intake/Output Summary (Last 24 hours) at 11/06/2017 1328 Last data filed at 11/06/2017 0858 Gross per 24 hour  Intake 253.67 ml  Output 350 ml  Net -96.33 ml   Height:   Ht Readings from Last 1 Encounters:  11/04/17 5\' 2"  (1.575 m)    Weight:   Wt Readings from Last 1 Encounters:  11/06/17 226 lb 3.1 oz (102.6 kg)    Ideal Body Weight:  50 kg  BMI:  Body mass index is 41.37 kg/m.  Estimated Nutritional Needs:   Kcal:  1500-1700  Protein:  75-90 gm  Fluid:  1200 ml per MD  Arthur Holms, RD, LDN Pager #: [phone] After-Hours Pager #: [phone]

## 2017-11-06 NOTE — Progress Notes (Signed)
   Subjective: Selena Bush was seen sitting up on the side of her bed this morning. She stated that she feels her chest pain has decreased in intensity. She feels that she is breathing well except when she move around.   Objective:  Vital signs in last 24 hours: Vitals:   11/05/17 2300 11/06/17 0000 11/06/17 0014 11/06/17 0500  BP:   (!) 78/47   Pulse: 67 65 66   Resp: (!) 21 (!) 21 20   Temp:   98.2 F (36.8 C)   TempSrc:   Oral   SpO2: 100% 98% 100%   Weight:    226 lb 3.1 oz (102.6 kg)  Height:       Physical Exam  Constitutional: She appears well-developed and well-nourished. No distress.  HENT:  Head: Normocephalic and atraumatic.  Eyes: Conjunctivae are normal.  Cardiovascular: Normal rate, regular rhythm and normal heart sounds.  Respiratory: Effort normal and breath sounds normal. No respiratory distress. She has no wheezes.  GI: Soft. Bowel sounds are normal. She exhibits no distension. There is no tenderness.  Musculoskeletal: She exhibits no edema.  Skin: She is not diaphoretic. No erythema.  Psychiatric: She has a normal mood and affect. Her behavior is normal. Judgment and thought content normal.    Assessment/Plan:  Selena Bush is a 72 y.o f withchronic diastolic chf, essential htn,papillaryDCISDR/PR positive s/p partial right mastectomy and radiation,HLD, OSA on cpappresented with atypical chest pain that is being managed with gerd and anxiety medication.   Atypical chest pain Patient feels that chest pain is resolving.   -continue Hydroxyzine 25mg  tid prn for anxiety -continue GI cocktail 97mL TID prn -sucralfate 1g tid with meals and qhs  Atrial fibrillation with rvr The patient converted to normal sinus rhythm last night 11/05/17 and subsequently converted back into afib this morning 11/06/17 with hr>160. The patient nessecitated 2 metoprolol 5mg  injections yesterday. She is currently being rate controlled with metoprolol 50mg  bid.   The patient is  likely to not benefit from a TEE cardioversion and subsequently convert back even after.   -continue xarelto  -continue diltiazem drip -po metoprolol 50mg  bid  -IV metoprolol 5mg  q24min prn  Chronic diastolic chf The patientappeared euvolemic on exam today. TTE (3/24) showed moderately calcified mitral annulus that can contribute to patients afib. It also showeed lvef 60-65%, mild lvh, and no regional wall motion abnormalities.   -continue homelasix 20mg  qd -Continue atenolol 50mg  qd  Hyponatremia The patient's sodium continues to be low at 126. Ur na=17, ur cr=262, ur osm=494, serum osm=473. According to the patient's urinary and sodium studies the patient's urine osm being higher than serum osm places concern for SIADH.   -Fluid restriction to 1275mL   Essential Hypertension The patient's blood pressure this morning is 125-159/82-91.   -continued home lisinopril 20mg  qd and hctz 12.5mg  qd  Anxiety Major Depressive Disorder  -Continue home suvorexant 1 tablet qhs -Hydroxyzine 25mg  tid prn for anxiety  OSA -cpap nightly   Dispo: Anticipated discharge in approximately 1-2 day(s).   Lars Mage, MD 11/06/2017, 6:39 AM Pager: [phone]

## 2017-11-06 NOTE — Progress Notes (Addendum)
Nils Pyle, RN  Cc: Carles Collet, RN        # 4. S/W ALEX @ [facility] # [phone] OPT- 2    1. XARELTO 15 MG BID  COVER- YES  CO-PAY- $ 45.00  TIER- 3 DRUG  PRIOR APPROVAL- NO   2. XARELTO 20 MG DAILY  COVER- YES  CO-PAY- $ 45.00  TIER- 3 DRUG  PRIOR APPROVAL- NO   3. ELIQUIS  2.5 MG BID  COVER- YES  CO-PAY- $ 45.00  TIER- 3 DRUG  PRIOR APPROVAL- NO    4. ELIQUIS  5 MG BID  COVER-YES  CO-PAY- $ 45.00  TIER- 3 DRUG  PRIOR APPROVAL- NO    PREFERRED PHARMACY : YES- [facility]    Patient provided with Xaralto card, explained use, patient verbalized understanding.

## 2017-11-06 NOTE — Care Management Note (Signed)
Case Management Note  Patient Details  Name: Selena Bush MRN: [id number] Date of Birth: 10-14-1945  Subjective/Objective:   From home alone, presents with atypical chest pain, developed afib.  NCM awaiting benefit check for xarelto.                   Action/Plan: NCM will follow for transition of care needs.  Expected Discharge Date:                  Expected Discharge Plan:  Home/Self Care  In-House Referral:     Discharge planning Services  CM Consult, Medication Assistance  Post Acute Care Choice:    Choice offered to:     DME Arranged:    DME Agency:     HH Arranged:    HH Agency:     Status of Service:  In process, will continue to follow  If discussed at Long Length of Stay Meetings, dates discussed:    Additional Comments:  Zenon Mayo, RN 11/06/2017, 10:19 AM

## 2017-11-07 DIAGNOSIS — I4891 Unspecified atrial fibrillation: Secondary | ICD-10-CM

## 2017-11-07 DIAGNOSIS — R0789 Other chest pain: Secondary | ICD-10-CM

## 2017-11-07 DIAGNOSIS — I5032 Chronic diastolic (congestive) heart failure: Secondary | ICD-10-CM

## 2017-11-07 DIAGNOSIS — G4733 Obstructive sleep apnea (adult) (pediatric): Secondary | ICD-10-CM

## 2017-11-07 LAB — CBC WITH DIFFERENTIAL/PLATELET
Basophils Absolute: 0 10*3/uL (ref 0.0–0.1)
Basophils Relative: 0 %
Eosinophils Absolute: 0 10*3/uL (ref 0.0–0.7)
Eosinophils Relative: 0 %
HCT: 33.6 % — ABNORMAL LOW (ref 36.0–46.0)
Hemoglobin: 11.3 g/dL — ABNORMAL LOW (ref 12.0–15.0)
Lymphocytes Relative: 15 %
Lymphs Abs: 1.4 10*3/uL (ref 0.7–4.0)
MCH: 27.8 pg (ref 26.0–34.0)
MCHC: 33.6 g/dL (ref 30.0–36.0)
MCV: 82.8 fL (ref 78.0–100.0)
Monocytes Absolute: 0.8 10*3/uL (ref 0.1–1.0)
Monocytes Relative: 9 %
Neutro Abs: 7.3 10*3/uL (ref 1.7–7.7)
Neutrophils Relative %: 76 %
Platelets: 319 10*3/uL (ref 150–400)
RBC: 4.06 MIL/uL (ref 3.87–5.11)
RDW: 14.2 % (ref 11.5–15.5)
WBC: 9.6 10*3/uL (ref 4.0–10.5)

## 2017-11-07 LAB — BASIC METABOLIC PANEL
Anion gap: 11 (ref 5–15)
BUN: 16 mg/dL (ref 6–20)
CO2: 22 mmol/L (ref 22–32)
Calcium: 8.9 mg/dL (ref 8.9–10.3)
Chloride: 96 mmol/L — ABNORMAL LOW (ref 101–111)
Creatinine, Ser: 1.15 mg/dL — ABNORMAL HIGH (ref 0.44–1.00)
GFR calc Af Amer: 55 mL/min — ABNORMAL LOW (ref >60)
GFR calc non Af Amer: 47 mL/min — ABNORMAL LOW (ref >60)
Glucose, Bld: 103 mg/dL — ABNORMAL HIGH (ref 65–99)
Potassium: 4.4 mmol/L (ref 3.5–5.1)
Sodium: 129 mmol/L — ABNORMAL LOW (ref 135–145)

## 2017-11-07 MED ORDER — DILTIAZEM HCL 100 MG IV SOLR
5.0000 mg/h | INTRAVENOUS | Status: DC
  Administered 2017-11-07: 10 mg/h via INTRAVENOUS
  Administered 2017-11-08: 20 mg/h via INTRAVENOUS
  Filled 2017-11-07 (×2): qty 100

## 2017-11-07 MED ORDER — DULOXETINE HCL 60 MG PO CPEP
120.0000 mg | ORAL_CAPSULE | Freq: Every day | ORAL | Status: DC
  Administered 2017-11-08 – 2017-11-10 (×3): 120 mg via ORAL
  Filled 2017-11-07 (×4): qty 2

## 2017-11-07 MED ORDER — SOTALOL HCL 80 MG PO TABS
80.0000 mg | ORAL_TABLET | Freq: Two times a day (BID) | ORAL | Status: DC
  Administered 2017-11-08 (×2): 80 mg via ORAL
  Filled 2017-11-07 (×3): qty 1

## 2017-11-07 MED ORDER — LISINOPRIL 20 MG PO TABS: 20.0000 mg | ORAL_TABLET | Freq: Every day | ORAL | Status: DC

## 2017-11-07 MED ORDER — FUROSEMIDE 10 MG/ML IJ SOLN
40.0000 mg | Freq: Once | INTRAMUSCULAR | Status: AC
  Administered 2017-11-07: 40 mg via INTRAVENOUS
  Filled 2017-11-07: qty 4

## 2017-11-07 NOTE — Consult Note (Addendum)
The patient has been seen in conjunction with Selena Bush, [facility]. All aspects of care have been considered and discussed. The patient has been personally interviewed, examined, and all clinical data has been reviewed.   Paroxysmal atrial fibrillation.  Probable superimposed acute on chronic diastolic heart failure leading to dyspnea.  Chest tightness could be rate related ischemia.  Currently in normal sinus rhythm.  Will start Betapace 80 mg p.o. twice daily.  Check QTC in a.m.  Continue Xarelto anticoagulation since CHADS VASC is greater than 1.  Once rhythm stable will need an ischemic evaluation, probably an outpatient myocardial perfusion study or coronary CTA.   Cardiology Consultation:   Patient ID: Selena Bush; [id number]; 1945-11-18   Admit date: 11/04/2017 Date of Consult: 11/07/2017  Primary Care Provider: Wardell Honour, MD Primary Cardiologist: Dr. Lyn Records  Patient Profile:   Selena Bush is a 72 y.o. female with a hx of diastolic heart failure (per echo in 2014 with EF 60-65% and DD), HTN, HLD, OSA on CPAP, right breast CA s/p partial mastectomy and radiation (no chemo), bilateral osteoarthritis s/p right TKA 04/16/13 and left TKA 09/15/14 and GERD who is being seen today for the evaluation of atrial fibrillation with RVR and chest pain at the request of Dr. Maricela Bo.   History of Present Illness:   [name] is a 72 year old female with a history as stated above who presented to [facility] emergency department on 11/04/2017 with complaints of substernal, radiating (to chin) chest pressure, rated a 4/10 in severity that began on Friday evening after laying down to got to sleep. She states that she was initially at her sons house eating dinner and began to feel pain in her upper abdomen. After getting home and taking her Nexium without relief, she laid down in hopes to fall alsleep and the pain would diminish and go away. Unfortunately, it began to radiate to her upper chest  and to her anterior neck. Due to her history of GERD, the patient took one Zantac without relief and proceeded to call EMS for transport to [facility] for further evaluation. She denied shortness of breath on admission, however has become dyspneic over her hospital course. She denies palpitations, LE swelling, orthopnea, PND, dizziness or syncope. She denies having previous episodes of chest pressure/pain in the past.   In the ED, an EKG was performed which revealed that she was in atrial fibrillation with RVR with no ST-T wave changes or signs of acute ischemia. Troponin levels were drawn which were negative x3 (<0.03, <0.03, <0.03). She was given hydroxyzine, GI cocktail, and sucralfate with relief. Her "chest pressure" has returned one additional time over her hospital course, in which she was once again given a GI cocktail with relief. She was started on PO Lasix 40mg  QD, metoprolol 50mg  PO BID and Xarelto for stroke protection. Unfortunately this did not help her heart rates much.    She was started on a diltiazem gtt between 5-15 mg/hr. Additionally, she was given a one time bolus of Amiodarone 150mg  IV. It appears per tele review that her rates are fairly controlled at times, even going in and out of NSR, however, with exertion, her rates will go into the 120-140's. She remains somewhat symptomatic with her atrial fibrillation, as she is SOB during our encounter.   She has previously been seen Dr. Gwenlyn Found for pre-op clearance of her left TKA and HTN years ago and was also seen by Dr. Clayborn Bigness for pre-op evaluation in 07/2014 during  which time she underwent nuclear stressing testing on 08/01/2014 that what negative for ischemia, negative for WMA, with an EF of 66%. She underwent successful left TKA on 09/15/2014 with an uneventful post-op course. Unfortunately, several days later she presented to [facility] with acute SOB and LE swelling. She was dx with diastolic CHF at that time and treated with IV Lasix with plans for  medication adjustments and close OP follow up. Fortunately, she was doing well and further follow up was not needed. She has been managed by her PCP. She was last seen by Dr. Rockey Situ on 01/02/15 with no significant issues and no medication changes.   Past Medical History:  Diagnosis Date  . Anxiety   . Chronic diastolic CHF (congestive heart failure) ([place])    a. echo 09/2014: EF 02-72%, diastolic dysfunction, mild LVH, nl RV size & systolic function, mildly dilated LA (4.3 cm), mild MR/TR, mildly elevated PASP 36.7 mm Hg  . DDD (degenerative disc disease), cervical   . DDD (degenerative disc disease), lumbar   . Depression   . Diffuse cystic mastopathy 2014  . GERD (gastroesophageal reflux disease)   . Headache    rare  . HLD (hyperlipidemia)    a. statin intolerant 2/2 myalgias  . Hypercholesterolemia   . Hypertension   . Malignant neoplasm of upper-outer quadrant of female breast ([place]) 10/2012   Papillary DCIS, sentinel node negative. DR/PR positive. PARTIAL RIGHT MASTECTOMY FOR BREAST CANCER--HAD RADIATION - NO CHEMO --DR. [place] ONCOLOGIST  . Obesity   . OSA on CPAP   . Osteoarthritis of both knees    a. s/p right TKA 04/2013 & left TKA 09/2014  . Otitis externa   . Personal history of radiation therapy 2015   RIGHT breast-mammosite per pt  . Sleep difficulties    LUNESTA HAS HELPED  . Vaginal cyst     Past Surgical History:  Procedure Laterality Date  . ABDOMINAL HYSTERECTOMY  1992   DUB; fibroids; endometriosis.  One remaining ovary.    [name] BREAST LUMPECTOMY Right 2015   Papillary DCIS, sentinel node negative. DR/PR positive. PARTIAL RIGHT MASTECTOMY FOR BREAST CANCER--HAD RADIATION - NO CHEMO --DR. [place] ONCOLOGIST  . BREAST SURGERY Right March 2014   Wide excision,APB RT 10 mm papillary DCIS, ER/PR positive. Sentinel node negative. Partial breast radiation.  [name] CATARACT EXTRACTION, BILATERAL  02/13/2016   [place].  . CHOLECYSTECTOMY  1994  .  COLONOSCOPY  2015   1 benign polyp-every 5 years/ Dr Candace Cruise  . ERCP  1995  . JOINT REPLACEMENT Right Sept 2014   knee  . TOTAL KNEE ARTHROPLASTY Right 04/16/2013   Procedure: RIGHT TOTAL KNEE ARTHROPLASTY;  Surgeon: Mauri Pole, MD;  Location: [facility] ORS;  Service: Orthopedics;  Laterality: Right;  . TOTAL KNEE ARTHROPLASTY Left 09/15/2014   Procedure: LEFT TOTAL KNEE ARTHROPLASTY;  Surgeon: Mauri Pole, MD;  Location: [facility] ORS;  Service: Orthopedics;  Laterality: Left;  . TUBAL LIGATION  1979     Prior to Admission medications   Medication Sig Start Date End Date Taking? Authorizing Provider  atenolol (TENORMIN) 50 MG tablet Take 1 tablet (50 mg total) by mouth daily. 10/11/17  Yes Wardell Honour, MD  colchicine 0.6 MG tablet Take 1 tablet (0.6 mg total) by mouth 2 (two) times daily. Patient taking differently: Take 0.6 mg by mouth 2 (two) times daily as needed (for gout).  10/11/17  Yes Wardell Honour, MD  esomeprazole (NEXIUM) 40 MG capsule Take 1 capsule (  40 mg total) by mouth every morning. 10/11/17  Yes Wardell Honour, MD  fluticasone [facility]) 50 MCG/ACT nasal spray Place 2 sprays into both nostrils daily as needed for rhinitis or allergies. 10/11/17  Yes Wardell Honour, MD  furosemide (LASIX) 20 MG tablet Take 1 tablet (20 mg total) by mouth daily. 10/11/17  Yes Wardell Honour, MD  lisinopril-hydrochlorothiazide (PRINZIDE,ZESTORETIC) 20-12.5 MG tablet Take 1 tablet by mouth daily. 10/11/17  Yes Wardell Honour, MD  raloxifene (EVISTA) 60 MG tablet TAKE 1 TABLET BY MOUTH EVERY DAY 04/21/17  Yes Byrnett, Forest Gleason, MD  ranitidine (ZANTAC) 150 MG tablet Take 150 mg by mouth 2 (two) times daily as needed for heartburn.   Yes [provider]  Suvorexant (BELSOMRA) 20 MG TABS Take 1 tablet by mouth at bedtime. 10/11/17  Yes Wardell Honour, MD  tiZANidine (ZANAFLEX) 4 MG tablet TAKE 1 TABLET BY MOUTH AT SUPPER AND TAKE 1 TABLET BY MOUTH AT BEDTIME Patient taking differently: Take 8 mg by  mouth at bedtime.  10/11/17  Yes Wardell Honour, MD    Inpatient Medications: Scheduled Meds: . furosemide  40 mg Oral Daily  . lisinopril  20 mg Oral Daily  . metoprolol tartrate  50 mg Oral BID  . pantoprazole  40 mg Oral Daily  . raloxifene  60 mg Oral Daily  . rivaroxaban  20 mg Oral Q supper  . sertraline  50 mg Oral Daily  . Suvorexant  1 tablet Oral QHS   Continuous Infusions: . diltiazem (CARDIZEM) infusion 5 mg/hr (11/07/17 1007)   PRN Meds: acetaminophen **OR** acetaminophen, gi cocktail, hydrOXYzine, ondansetron **OR** ondansetron (ZOFRAN) IV, senna-docusate, tiZANidine  Allergies:    Allergies  Allergen Reactions  . Asa [Aspirin]   . Codeine     Gi problems   . Penicillins     anaphylaxis    . Statins Other (See Comments)    Leg pains  . Sulfa Antibiotics     anaphylaxis     Social History:   Social History   Socioeconomic History  . Marital status: Widowed    Spouse name: Chrissie Noa  . Number of children: 2  . Years of education: College  . Highest education level: Bachelor's degree (e.g., BA, AB, BS)  Occupational History  . Occupation: Retired  Scientific laboratory technician  . Financial resource strain: Not hard at all  . Food insecurity:    Worry: Never true    Inability: Never true  . Transportation needs:    Medical: No    Non-medical: No  Tobacco Use  . Smoking status: Never Smoker  . Smokeless tobacco: Never Used  . Tobacco comment: social smoker as a teen  Substance and Sexual Activity  . Alcohol use: No  . Drug use: No  . Sexual activity: Not Currently    Birth control/protection: Post-menopausal, Surgical  Lifestyle  . Physical activity:    Days per week: 7 days    Minutes per session: 20 min  . Stress: Only a little  Relationships  . Social connections:    Talks on phone: More than three times a week    Gets together: More than three times a week    Attends religious service: More than 4 times per year    Active member of club or  organization: Yes    Attends meetings of clubs or organizations: More than 4 times per year    Relationship status: Widowed  . Intimate partner violence:  Fear of current or ex partner: No    Emotionally abused: No    Physically abused: No    Forced sexual activity: No  Other Topics Concern  . Not on file  Social History Narrative   Marital status: widowed since 09/16/2015      Children: 2 children (3, 38); 5 grandchild (4 in [place]; 1 in [place]).      Lives: alone in townhome; 1 dog, 2 cats      Employment: psychiatric Education officer, museum; retired in 2015      Tobacco: teenager only      Alcohol:  None      Drugs: none      Exercise: walking in 2019; more active in 2019.  Walking daily small amounts.        ADLs: drives; independent with ADLs; no assistant devices      Advanced Directives: none; FULL CODE; no prolonged measures.   Does not need them.  Daughter/Heather Vista Deck is HCPOA.     Family History:   Family History  Problem Relation Age of Onset  . Colon cancer Mother   . Cancer Mother 72       colon cancer  . Melanoma Father   . Cancer Father 27       melanoma  . Colon cancer Maternal Grandfather   . Breast cancer Maternal Grandfather    Family Status:  Family Status  Relation Name Status  . Mother  Deceased at age 92       colon cancer  . Father  Deceased at age 4       melanoma chest  . MGF  (Not Specified)  . Sister  Alive  . Brother  Alive  . Sister  Alive    ROS:  Please see the history of present illness.  All other ROS reviewed and negative.     Physical Exam/Data:   Vitals:   11/07/17 0935 11/07/17 0940 11/07/17 0955 11/07/17 1000  BP:  (!) 134/91 (!) 133/92 118/71  Pulse: (!) 147 74 (!) 102 74  Resp: (!) 27 (!) 25 16 18   Temp:   98.6 F (37 C)   TempSrc:   Oral   SpO2: 95% 100% 100% 100%  Weight:      Height:        Intake/Output Summary (Last 24 hours) at 11/07/2017 1202 Last data filed at 11/07/2017 0715 Gross per 24 hour  Intake 680 ml   Output 1600 ml  Net -920 ml   Filed Weights   11/05/17 0400 11/06/17 0500 11/07/17 0500  Weight: 227 lb 4.7 oz (103.1 kg) 226 lb 3.1 oz (102.6 kg) 227 lb 15.3 oz (103.4 kg)   Body mass index is 41.69 kg/m.   General: Well developed, well nourished, NAD Skin: Warm, dry, intact  Head: Normocephalic, atraumatic,clear, moist mucus membranes. Neck: Negative for carotid bruits. No JVD Lungs:Clear to ausculation bilaterally. No wheezes, rales, or rhonchi. Breathing is unlabored. Cardiovascular: Irregularly irregular with S1 S2. No murmurs, rubs, or gallops Abdomen: Soft, non-tender, non-distended with normoactive bowel sounds. No hepatomegaly, No rebound/guarding. No obvious abdominal masses. MSK: Strength and tone appear normal for age. 5/5 in all extremities Extremities: Mild 1+ LE edema. No clubbing or cyanosis. DP/PT pulses 1+ bilaterally Neuro: Alert and oriented. No focal deficits. No facial asymmetry. MAE spontaneously. Psych: Responds to questions appropriately with normal affect.    EKG:  The EKG was personally reviewed and demonstrates: 11/06/17 Atrial fibrillation with RVR  Telemetry:  Telemetry was  personally reviewed and demonstrates: 03/26/189 NSR HR 82  Relevant CV Studies:  ECHO: 11/05/17 Study Conclusions  - Left ventricle: The cavity size was normal. Wall thickness was   increased in a pattern of mild LVH. Systolic function was normal.   The estimated ejection fraction was in the range of 60% to 65%.   Wall motion was normal; there were no regional wall motion   abnormalities. The study is not technically sufficient to allow   evaluation of LV diastolic function. - Aortic valve: Moderate calcification involving the noncoronary   cusp. Noncoronary cusp mobility was restricted. - Mitral valve: Moderately calcified annulus. There was mild   regurgitation. - Left atrium: The atrium was mildly dilated. - Right atrium: Central venous pressure (est): 3 mm Hg. -  Tricuspid valve: There was mild regurgitation. - Pulmonary arteries: Systolic pressure could not be accurately   estimated. - Pericardium, extracardiac: There was no pericardial effusion.   CATH: NA  Laboratory Data:  Chemistry Recent Labs  Lab 11/05/17 0354 11/06/17 0139 11/07/17 0648  NA 128* 126* 129*  K 3.2* 4.3 4.4  CL 91* 94* 96*  CO2 23 25 22   GLUCOSE 144* 124* 103*  BUN 11 16 16   CREATININE 1.24* 1.55* 1.15*  CALCIUM 9.1 8.6* 8.9  GFRNONAA 43* 33* 47*  GFRAA 50* 38* 55*  ANIONGAP 14 7 11     Total Protein  Date Value Ref Range Status  11/05/2017 6.4 (L) 6.5 - 8.1 g/dL Final  10/02/2017 6.8 6.0 - 8.5 g/dL Final   Albumin  Date Value Ref Range Status  11/05/2017 3.2 (L) 3.5 - 5.0 g/dL Final  10/02/2017 4.2 3.6 - 4.8 g/dL Final   AST  Date Value Ref Range Status  11/05/2017 17 15 - 41 U/L Final   ALT  Date Value Ref Range Status  11/05/2017 10 (L) 14 - 54 U/L Final   Alkaline Phosphatase  Date Value Ref Range Status  11/05/2017 66 38 - 126 U/L Final   Total Bilirubin  Date Value Ref Range Status  11/05/2017 1.1 0.3 - 1.2 mg/dL Final   Bilirubin Total  Date Value Ref Range Status  10/02/2017 0.3 0.0 - 1.2 mg/dL Final   Hematology Recent Labs  Lab 11/04/17 0604 11/05/17 0354 11/07/17 0648  WBC 17.1* 12.0* 9.6  RBC 4.26 4.06 4.06  HGB 11.5* 11.1* 11.3*  HCT 36.0 33.9* 33.6*  MCV 84.5 83.5 82.8  MCH 27.0 27.3 27.8  MCHC 31.9 32.7 33.6  RDW 13.8 13.7 14.2  PLT 312 252 319   Cardiac Enzymes Recent Labs  Lab 11/04/17 1005 11/04/17 1908 11/04/17 2246  TROPONINI <0.03 <0.03 <0.03    Recent Labs  Lab 11/04/17 0614  TROPIPOC 0.00    BNPNo results for input(s): BNP, PROBNP in the last 168 hours.  DDimer No results for input(s): DDIMER in the last 168 hours. TSH:  Lab Results  Component Value Date   TSH 3.310 11/04/2017   Lipids: Lab Results  Component Value Date   CHOL 221 (H) 10/02/2017   HDL 60 10/02/2017   LDLCALC 132 (H)  10/02/2017   TRIG 143 10/02/2017   CHOLHDL 3.7 10/02/2017   HgbA1c: Lab Results  Component Value Date   HGBA1C 5.7 (H) 11/04/2017    Radiology/Studies:  Dg Chest 2 View  Result Date: 11/04/2017 CLINICAL DATA:  Chest pain/pressure. EXAM: CHEST - 2 VIEW COMPARISON:  Radiographs and CT 09/20/2014 FINDINGS: Chronic elevation of right hemidiaphragm with adjacent passive atelectasis. Mild cardiomegaly which is  unchanged from prior. Left midlung scarring. No confluent airspace disease. No pleural effusion or pneumothorax. IMPRESSION: 1. No acute abnormality. 2. Chronic elevation of right hemidiaphragm. Unchanged cardiomegaly. 3. Left midlung scarring. Electronically Signed   By: Jeb Levering M.D.   On: 11/04/2017 06:55    Assessment and Plan:   1. New onset atrial fibrillation with RVR: -Patient started on IV diltiazem at 5mg /hr (5-10mg /hr)>> she is currently going in and out of atrial fibrillation with rates controlled while in NSR (80's). She dose have periods, per tele review, that her rates are in the 120-140's while in Afib -On metoprolol 50 mg PO BID>>> increased to 75 mg on 11/06/2017 for one dose secondary to elevated rates -Xarelto 20mg  started for stroke protection  -Patient with a history of sleep apnea on CPAP -Echocardiogram with normal EF and NWMA -Consider increasing PO metoprolol given that her pressures are stable. Titrate diltiazem gtt to a dose where she is not having episodes of rapid afib, then once stable convert her to PO dose with close OP follow up and continue Xarelto -Will set her up for TEE cardioversion, planned for tomorrow if she does not convert on her own between now and then  -Will start sotalol 80mg  BID given that she is currently in NSR with controlled rates -Will give one time dose of IV Lasix 40mg  secondary to  ongoing dyspnea and monitor output  -EKG in the AM -BMET for electrolytes in the AM -Will need ischemic workup (coronary CT versus Lexiscan  stress) at some point to evaluate for CAD. Could potentially do this as OP  -CHA2DS2VASc =2  2.  Atypical chest pain: -Denies chest pain this afternoon>>appears to be more GI in nature given that it disappears with GI cocktail administration  -Primary team feels this is secondary to anxiety? -Negative trops with no acute changes on EKG -On BB  -Hydroxyzine 25 mg PRN -GI cocktail 30 mL PRN -Sucralfate 1g TID with meals and qhs  3.  Chronic diastolic heart failure: -Per echocardiogram 11/05/2017 with LVEF 60-65%, mild LVH and no regional wall motion abnormalities -Lasix 40 mg PO QD -Creatitine improved with increase of Lasix -Dose not appear to be excessively volume overloaded on exam  -Weight, 227lb>>> 226lb on admission 11/04/2017 -I&O, net -1.3 L, total output 244ml today 11/06/17  4.  Hyponatremia: -Per primary team  -Secondary to SIADH per note   5.  Anxiety/major depressive disorder: -Per primary team -Zoloft   6.  OSA: -CPAP, compliant   7.Chronic renal insuffiencey stage II: -Cr, 1.15. Baseline appears to be in the 1.2 range    For questions or updates, please contact [place] Please consult [email or web address] for contact info under Cardiology/STEMI.   SignedKathyrn Drown NP-C [place] Pager: [phone] 11/07/2017 12:02 PM

## 2017-11-07 NOTE — Progress Notes (Signed)
   Subjective: Selena Bush was seen laying in her bed this morning and she stated that she feels worse than she did previously and mentions that she feels like a burden because her heartrate has not been well controlled. The team expressed that this happens and she should not feel like a burden.   Objective:  Vital signs in last 24 hours: Vitals:   11/07/17 0000 11/07/17 0300 11/07/17 0400 11/07/17 0500  BP: (!) 134/102 103/86 127/86   Pulse: (!) 124     Resp: (!) 29 17 (!) 27   Temp: 97.7 F (36.5 C)     TempSrc: Axillary     SpO2: 100%     Weight:    227 lb 15.3 oz (103.4 kg)  Height:       Physical Exam  Constitutional: She appears well-developed and well-nourished.  HENT:  Head: Normocephalic and atraumatic.  Eyes: Conjunctivae are normal.  Cardiovascular: An irregularly irregular rhythm present. Tachycardia present.  No murmur heard. Respiratory: Breath sounds normal. No respiratory distress. She has no wheezes.  Saturating on 2L [place]  GI: Soft. Bowel sounds are normal. She exhibits no distension. There is no tenderness.  Musculoskeletal: She exhibits no edema.  Neurological: She is alert.  Skin: No erythema.  Psychiatric: She has a normal mood and affect. Her behavior is normal. Judgment and thought content normal.   Assessment/Plan:  Selena Bush is a 72 y.o f withchronic HFpEF, essential htn,papillaryDCISDR/PR positive s/p partial right mastectomy and radiation,HLD, OSA on cpappresented with atypical chest pain that is being managed with gerd and anxiety medication.   Atrial fibrillation with rvr Patient continues to convert back and forth to atrial fib. She is currently on metoprolol 50mg  bid. Her evening dose of metoprolol was increased to 75mg  yesterday 11/06/17. She was started on IV amiodarone 150mg  at 663ml/hr last night.   The patient is likely to not benefit from a TEE cardioversion. Consulted cardiology today 11/07/17 for assistance regarding rate and rhythm  control.   -continue xarelto  -Pending cardiology recommendations -po metoprolol 50mg  bid   Atypical chest pain Chest pain appears to have resolved.  -continueHydroxyzine 25mg  tid prn for anxiety -continueGI cocktail 67mL TID prn -sucralfate 1g tid with meals and qhs  Chronic diastolic chf The patient put out 1.5L over the past 24 hrs and her weight continues to remain unchanged (226lbs yesterday and 227lbs today).   TTE (3/24) showed moderately calcified mitral annulus that can contribute to patients afib. It also showeed lvef 60-65%, mild lvh, and no regional wall motion abnormalities.   -Lasix 40mg  qd  Hyponatremia likely secondary to SIADH The patient's sodium improved today to 129 from 126 yesterday. The patient's siadh maybe due to zoloft and cymbalta which she is on. It is unlikely to be from hypothyroidism as tsh=3.3.  -Fluid restriction to 1280mL  Essential Hypertension The patient's blood pressure this morning is 118-152/71-100.  -held home hctz due to hyponatremia concern -lisinopril 20mg  qd  Anxiety Major Depressive Disorder  -Continue home suvorexant 1 tablet qhs -Hydroxyzine 25mg  tid prn for anxiety  OSA -cpap nightly   Dispo: Anticipated discharge in approximately 1-2 day(s).   Lars Mage, MD 11/07/2017, 6:40 AM Pager: [phone]

## 2017-11-07 NOTE — Progress Notes (Signed)
  Date: 11/07/2017  Patient name: ISLA SABREE  Medical record number: [id number]  Date of birth: 1945-11-19   I have seen and evaluated this patient and I have discussed the plan of care with the house staff. Please see their note for complete details. I concur with their findings with the following additions/corrections:   Back in A fib again overnight with difficulty rate control, continued on PO metoprolol, night team restarted diltiazem drip and titrated up, rates still 120-140, gave amiodarone 150 mg IV. This morning still in A fib with RVR, but spontaneously converted back to sinus rhythm during rounds. HR in 70s when in sinus. We have consulted cardiology given her challenging rate control when in paroxysmal A fib.   Hyponatremia improving, likely some combination of SIADH and mild volume overload due to A fib and HFpEF, appears close to euvolemic this morning, Na up to 129.  Chest pain has not returned, unlikely ACS with negative troponins.  Lenice Pressman, M.D., Ph.D. 11/07/2017, 4:35 PM

## 2017-11-07 NOTE — Progress Notes (Signed)
Pt HR still in 130-140's resting. Dr. Heber [place] notified. Orders given for Amiodarone 150mg  IV bolus.

## 2017-11-08 DIAGNOSIS — I480 Paroxysmal atrial fibrillation: Secondary | ICD-10-CM

## 2017-11-08 LAB — BASIC METABOLIC PANEL
Anion gap: 13 (ref 5–15)
BUN: 14 mg/dL (ref 6–20)
CO2: 24 mmol/L (ref 22–32)
Calcium: 8.9 mg/dL (ref 8.9–10.3)
Chloride: 93 mmol/L — ABNORMAL LOW (ref 101–111)
Creatinine, Ser: 1.04 mg/dL — ABNORMAL HIGH (ref 0.44–1.00)
GFR calc Af Amer: >60 mL/min (ref >60)
GFR calc non Af Amer: 54 mL/min — ABNORMAL LOW (ref >60)
Glucose, Bld: 119 mg/dL — ABNORMAL HIGH (ref 65–99)
Potassium: 3.3 mmol/L — ABNORMAL LOW (ref 3.5–5.1)
Sodium: 130 mmol/L — ABNORMAL LOW (ref 135–145)

## 2017-11-08 MED ORDER — SOTALOL HCL 80 MG PO TABS
120.0000 mg | ORAL_TABLET | Freq: Two times a day (BID) | ORAL | Status: DC
  Administered 2017-11-08 – 2017-11-10 (×4): 120 mg via ORAL
  Filled 2017-11-08 (×4): qty 2

## 2017-11-08 MED ORDER — AMIODARONE IV BOLUS ONLY 150 MG/100ML
150.0000 mg | Freq: Once | INTRAVENOUS | Status: AC
  Administered 2017-11-08: 150 mg via INTRAVENOUS
  Filled 2017-11-08: qty 100

## 2017-11-08 MED ORDER — ESOMEPRAZOLE MAGNESIUM 40 MG PO CPDR
40.0000 mg | DELAYED_RELEASE_CAPSULE | Freq: Every day | ORAL | Status: DC
  Administered 2017-11-08 – 2017-11-10 (×3): 40 mg via ORAL
  Filled 2017-11-08 (×4): qty 1

## 2017-11-08 MED ORDER — NON FORMULARY: 2.0000 | Freq: Every day | Status: DC

## 2017-11-08 MED ORDER — ALUM & MAG HYDROXIDE-SIMETH 200-200-20 MG/5ML PO SUSP
15.0000 mL | Freq: Three times a day (TID) | ORAL | Status: DC
  Administered 2017-11-08 – 2017-11-10 (×9): 15 mL via ORAL
  Filled 2017-11-08 (×10): qty 30

## 2017-11-08 NOTE — Progress Notes (Signed)
PT Cancellation Note  Patient Details Name: Selena Bush MRN: [id number] DOB: 1946/04/05   Cancelled Treatment:    Reason Eval/Treat Not Completed: Other (comment). Second attempt, IV team still present in room attempting to start an IV. PT will continue to f/u with pt as available.    [place] 11/08/2017, 4:40 PM

## 2017-11-08 NOTE — Progress Notes (Signed)
Progress Note  Patient Name: Selena Bush Date of Encounter: 11/08/2017  Primary Cardiologist: No primary care provider on file. Dr. Rockey Situ remotely  Subjective   Feels better, no CP, no SOB. Still a little sluggish  Inpatient Medications    Scheduled Meds: . alum & mag hydroxide-simeth  15 mL Oral TID WC & HS  . DULoxetine  120 mg Oral Daily  . esomeprazole  40 mg Oral Daily  . furosemide  40 mg Oral Daily  . metoprolol tartrate  50 mg Oral BID  . raloxifene  60 mg Oral Daily  . rivaroxaban  20 mg Oral Q supper  . sertraline  50 mg Oral Daily  . sotalol  80 mg Oral Q12H  . Suvorexant  1 tablet Oral QHS   Continuous Infusions: . diltiazem (CARDIZEM) infusion Stopped (11/08/17 0551)   PRN Meds: acetaminophen **OR** acetaminophen, gi cocktail, hydrOXYzine, ondansetron **OR** ondansetron (ZOFRAN) IV, senna-docusate, tiZANidine   Vital Signs    Vitals:   11/08/17 0230 11/08/17 0411 11/08/17 0500 11/08/17 0600  BP: (!) 147/113 (!) 129/93 (!) 128/99 110/87  Pulse: 85 80 (!) 39 88  Resp: 18 18 (!) 28 15  Temp:  98.7 F (37.1 C)    TempSrc:  Oral    SpO2: 99% 98% 97% 100%  Weight:   222 lb 0.1 oz (100.7 kg)   Height:        Intake/Output Summary (Last 24 hours) at 11/08/2017 1250 Last data filed at 11/08/2017 0838 Gross per 24 hour  Intake 1435.33 ml  Output 50 ml  Net 1385.33 ml   Filed Weights   11/06/17 0500 11/07/17 0500 11/08/17 0500  Weight: 226 lb 3.1 oz (102.6 kg) 227 lb 15.3 oz (103.4 kg) 222 lb 0.1 oz (100.7 kg)    Telemetry    Brief NSR, now AFIB 120-130 - Personally Reviewed  ECG    AFIB 140 - Personally Reviewed  Physical Exam   GEN: No acute distress.  obese Neck: No JVD Cardiac: irreg tachy, no murmurs, rubs, or gallops.  Respiratory: Clear to auscultation bilaterally. GI: Soft, nontender, non-distended  MS: No edema; No deformity. Neuro:  Nonfocal  Psych: Normal affect   Labs    Chemistry Recent Labs  Lab 11/05/17 0354  11/06/17 0139 11/07/17 0648 11/08/17 0319  NA 128* 126* 129* 130*  K 3.2* 4.3 4.4 3.3*  CL 91* 94* 96* 93*  CO2 23 25 22 24   GLUCOSE 144* 124* 103* 119*  BUN 11 16 16 14   CREATININE 1.24* 1.55* 1.15* 1.04*  CALCIUM 9.1 8.6* 8.9 8.9  PROT 6.4*  --   --   --   ALBUMIN 3.2*  --   --   --   AST 17  --   --   --   ALT 10*  --   --   --   ALKPHOS 66  --   --   --   BILITOT 1.1  --   --   --   GFRNONAA 43* 33* 47* 54*  GFRAA 50* 38* 55* >60  ANIONGAP 14 7 11 13      Hematology Recent Labs  Lab 11/04/17 0604 11/05/17 0354 11/07/17 0648  WBC 17.1* 12.0* 9.6  RBC 4.26 4.06 4.06  HGB 11.5* 11.1* 11.3*  HCT 36.0 33.9* 33.6*  MCV 84.5 83.5 82.8  MCH 27.0 27.3 27.8  MCHC 31.9 32.7 33.6  RDW 13.8 13.7 14.2  PLT 312 252 319    Cardiac Enzymes Recent  Labs  Lab 11/04/17 1005 11/04/17 1908 11/04/17 2246  TROPONINI <0.03 <0.03 <0.03    Recent Labs  Lab 11/04/17 0614  TROPIPOC 0.00     BNPNo results for input(s): BNP, PROBNP in the last 168 hours.   DDimer No results for input(s): DDIMER in the last 168 hours.   Radiology    No results found.  Cardiac Studies   ECHO this admit: - Left ventricle: The cavity size was normal. Wall thickness was   increased in a pattern of mild LVH. Systolic function was normal.   The estimated ejection fraction was in the range of 60% to 65%.   Wall motion was normal; there were no regional wall motion   abnormalities. The study is not technically sufficient to allow   evaluation of LV diastolic function. - Aortic valve: Moderate calcification involving the noncoronary   cusp. Noncoronary cusp mobility was restricted. - Mitral valve: Moderately calcified annulus. There was mild   regurgitation. - Left atrium: The atrium was mildly dilated. - Right atrium: Central venous pressure (est): 3 mm Hg. - Tricuspid valve: There was mild regurgitation. - Pulmonary arteries: Systolic pressure could not be accurately   estimated. -  Pericardium, extracardiac: There was no pericardial effusion.  Patient Profile     72 y.o. female with PAF  Assessment & Plan    PAF  - now on sotalol 80 BID, low dose, with her weight and clearance will move to 120 BID  - monitor QT with ECG tomorrow.   - ECHO normal EF  - Has some periods of NSR 75bpm.   Morbid obesity  - major factor in AFIB  Discussed with family. If necessary ask EP to get involved. Appreciate curbside from [employer].    For questions or updates, please contact [place] Please consult [email or web address] for contact info under Cardiology/STEMI.      Signed, Candee Furbish, MD  11/08/2017, 12:50 PM

## 2017-11-08 NOTE — Progress Notes (Addendum)
Pt converted from NSR to A fib with RVR at 2135 on 11/07/17. Pt was already on cardizem at 5 mg/hr. The on call cardiologist was text paged at 2145 and this nurse was instructed to hold the pt's sotalol due at 2200 and to increase the cardizem gtt to 10 mg/hr. The cardizem gtt was titrated per the order parameters and at approximately 0030 on 11/08/17 the on call cardiologist was text paged regarding the pt's HR remaining between 120-160 while the cardizem gtt was maxed out at 20 mg/hr. An amiodarone bolus was ordered and it was administered at 0057.   At [place] on 11/08/17, the on call cardiologist Fudim was text paged at [place] regarding the cardizem gtt being maxed out at 20 mg/hr and the pt's HR remaining between 120-160 bpm despite the amiodarone bolus being completed.  The on call cardiology MD was text paged three more times at [place], 0210, and 0307.   This nurse received a response call from the on call cardiology MD at 0354 and was instructed to give the sotalol dose that had been held at 2200 on 11/07/17, and to continue the cardizem gtt at 20 mg/hr.   The pt had a 2.3 second pause at 0542 on 11/08/17, the on call cardiology MD was text paged and this nurse was instructed to discontinue the cardizem gtt at 0547.

## 2017-11-08 NOTE — Progress Notes (Signed)
   Subjective: Selena Bush was seen sitting up in her bed this morning with her daughter at bedside. She states that she is feeling better and no longer has any chest pain or shortness of breath today.   ON events- the patient converted to Afib with rvr at 2135 she was maxed on cardizem drip at 20mg , amiodarone bolus was given without relief, and she was continued on sotalol. The patient remained in afib overnight.  Objective:  Vital signs in last 24 hours: Vitals:   11/08/17 0230 11/08/17 0411 11/08/17 0500 11/08/17 0600  BP: (!) 147/113 (!) 129/93 (!) 128/99 110/87  Pulse: 85 80 (!) 39 88  Resp: 18 18 (!) 28 15  Temp:  98.7 F (37.1 C)    TempSrc:  Oral    SpO2: 99% 98% 97% 100%  Weight:   222 lb 0.1 oz (100.7 kg)   Height:       Physical Exam  Constitutional: She appears well-developed and well-nourished. No distress.  HENT:  Head: Normocephalic and atraumatic.  Eyes: Conjunctivae are normal.  Cardiovascular: Normal heart sounds. An irregularly irregular rhythm present. Tachycardia present.  Respiratory: Effort normal and breath sounds normal. No respiratory distress. She has no wheezes.  GI: Soft. Bowel sounds are normal. She exhibits no distension. There is no tenderness.  Musculoskeletal: She exhibits no edema.  Neurological: She is alert.  Skin: She is not diaphoretic. No erythema.  Psychiatric: She has a normal mood and affect. Her behavior is normal. Judgment and thought content normal.   Assessment/Plan:  Selena Bush is a 72 y.o f withchronic HFpEF, essential htn,papillaryDCISDR/PR positive s/p partial right mastectomy and radiation,HLD, OSA on cpappresented with atypical chest pain that is being managed with gerd and anxiety medication.   Paroxysmal afib Patient continues to convert back and forth to atrial fib. She is currently in afib with rvr while on sotalol and po metoprolol 50mg  bid. She remains hemodynamically stable. Will continue to monitor whether the  patient's heart rate will be alleviated after sotolol reaches therapeutic level.   -Appreciate Cardiology following and their recs -continuexarelto  -po metoprolol 50mg  bid  -po sotalol 80mg  q12hrs  Atypical chest pain Patient mentioned that she feels her chest pain has resolved. The patient states that she cannot tolerate protonix and therefore will continue on home nexium.   -continueHydroxyzine 25mg  tid prn for anxiety -continueGI cocktail 64mL TID prn -sucralfate 1g tid with meals and qhs  Chronic diastolic chf The patient put out 256mlL over the past 24 hrs, however her weight has decreased from 227lbs to 222lbs.  TTE (3/24) showed moderately calcified mitral annulus that can contribute to patients afib. It also showeed lvef 60-65%, mild lvh, and no regional wall motion abnormalities.  -po Lasix 40mg  qd  Hyponatremia likely secondary to SIADH The patient's sodium continues to improve to 130 from 129 yesterday.   -Fluid restriction to 1227mL  Essential Hypertension The patient's blood pressure this morning is110-177/87-91.  -held home hctz due to hyponatremia concern -lisinopril 20mg  qd  Anxiety Major Depressive Disorder  -Continue home suvorexant 1 tablet qhs -Hydroxyzine 25mg  tid prn for anxiety  OSA -cpap nightly  Dispo: Anticipated discharge in approximately 1-2 day(s).   Lars Mage, MD 11/08/2017, 7:07 AM Pager: [phone]

## 2017-11-08 NOTE — Progress Notes (Signed)
  Date: 11/08/2017  Patient name: Selena Bush  Medical record number: [id number]  Date of birth: 1945-11-09   I have seen and evaluated this patient and I have discussed the plan of care with the house staff. Please see their note for complete details. I concur with their findings with the following additions/corrections:   Back in atrial fibrillation again overnight, night team again had difficulty controlling her rate. Restarted on diltiazem drip, but unable to achieve rate control even with next out rate at 20 mg per hour. She received a bolus of amiodarone 150 mg, followed by a dose of sotalol 80 mg when her rates did not improve. After this, her nurse noted a 2.3 second pause, and the diltiazem was stopped.  This morning, she remains in A. fib but with slightly improved rates primarily in the 100s to 110s. Cardiology recommended increasing her sotalol to 120 mg twice a day today.  She remains on nasal cannula for respiratory support, and has developed mild bibasilar crackles. It is likely with her HFpEF that she does not tolerate A. fib well, so we will continue to work on rate control and consider possible rhythm control with the assistance of cardiology.  Her hyponatremia has slowly improved with a sodium of 130 today, continue fluid restriction for now. Her psychiatric medications could possibly be contributing to this, but believe these are important medications for her mental health and overall health, so we will continue them for now.  Lenice Pressman, M.D., Ph.D. 11/08/2017, 3:54 PM

## 2017-11-08 NOTE — Plan of Care (Signed)
  Problem: Clinical Measurements: Goal: Cardiovascular complication will be avoided Outcome: Not Progressing Note:  Pt converted back into afib RVR again overnight with resistance to cardizem gtt and amiodarone bolus.

## 2017-11-09 LAB — BASIC METABOLIC PANEL
Anion gap: 10 (ref 5–15)
BUN: 20 mg/dL (ref 6–20)
CO2: 26 mmol/L (ref 22–32)
Calcium: 8.9 mg/dL (ref 8.9–10.3)
Chloride: 93 mmol/L — ABNORMAL LOW (ref 101–111)
Creatinine, Ser: 1.16 mg/dL — ABNORMAL HIGH (ref 0.44–1.00)
GFR calc Af Amer: 54 mL/min — ABNORMAL LOW (ref >60)
GFR calc non Af Amer: 47 mL/min — ABNORMAL LOW (ref >60)
Glucose, Bld: 106 mg/dL — ABNORMAL HIGH (ref 65–99)
Potassium: 3.6 mmol/L (ref 3.5–5.1)
Sodium: 129 mmol/L — ABNORMAL LOW (ref 135–145)

## 2017-11-09 LAB — METANEPHRINES, PLASMA
Metanephrine, Free: 45 pg/mL (ref 0–62)
Normetanephrine, Free: 299 pg/mL — ABNORMAL HIGH (ref 0–145)

## 2017-11-09 MED ORDER — METOPROLOL TARTRATE 25 MG PO TABS
25.0000 mg | ORAL_TABLET | Freq: Once | ORAL | Status: AC
  Administered 2017-11-09: 25 mg via ORAL
  Filled 2017-11-09: qty 1

## 2017-11-09 MED ORDER — METOPROLOL TARTRATE 50 MG PO TABS
75.0000 mg | ORAL_TABLET | Freq: Two times a day (BID) | ORAL | Status: DC
  Administered 2017-11-09 – 2017-11-10 (×2): 75 mg via ORAL
  Filled 2017-11-09 (×2): qty 1

## 2017-11-09 MED ORDER — DILTIAZEM HCL 30 MG PO TABS
30.0000 mg | ORAL_TABLET | Freq: Four times a day (QID) | ORAL | Status: DC
  Administered 2017-11-09 – 2017-11-10 (×4): 30 mg via ORAL
  Filled 2017-11-09 (×4): qty 1

## 2017-11-09 NOTE — Progress Notes (Signed)
ANTICOAGULATION CONSULT NOTE - [place] for Xarelto Indication: atrial fibrillation  Allergies  Allergen Reactions  . Asa [Aspirin]   . Codeine     Gi problems   . Penicillins     anaphylaxis    . Statins Other (See Comments)    Leg pains  . Sulfa Antibiotics     anaphylaxis     Patient Measurements: Height: 5\' 2"  (157.5 cm) Weight: 223 lb 8.7 oz (101.4 kg) IBW/kg (Calculated) : 50.1  Vital Signs: Temp: 97.7 F (36.5 C) (03/28 0800) Temp Source: Oral (03/28 0800) BP: 130/82 (03/28 0800) Pulse Rate: 128 (03/28 0800)  Labs: Recent Labs    11/07/17 0648 11/08/17 0319 11/09/17 0312  HGB 11.3*  --   --   HCT 33.6*  --   --   PLT 319  --   --   CREATININE 1.15* 1.04* 1.16*    Estimated Creatinine Clearance: 51 mL/min (A) (by C-G formula based on SCr of 1.16 mg/dL (H)).   Medical History: Past Medical History:  Diagnosis Date  . Anxiety   . Chronic diastolic CHF (congestive heart failure) ([place])    a. echo 09/2014: EF 40-98%, diastolic dysfunction, mild LVH, nl RV size & systolic function, mildly dilated LA (4.3 cm), mild MR/TR, mildly elevated PASP 36.7 mm Hg  . DDD (degenerative disc disease), cervical   . DDD (degenerative disc disease), lumbar   . Depression   . Diffuse cystic mastopathy 2014  . GERD (gastroesophageal reflux disease)   . Headache    rare  . HLD (hyperlipidemia)    a. statin intolerant 2/2 myalgias  . Hypercholesterolemia   . Hypertension   . Malignant neoplasm of upper-outer quadrant of female breast ([place]) 10/2012   Papillary DCIS, sentinel node negative. DR/PR positive. PARTIAL RIGHT MASTECTOMY FOR BREAST CANCER--HAD RADIATION - NO CHEMO --DR. [place] ONCOLOGIST  . Obesity   . OSA on CPAP   . Osteoarthritis of both knees    a. s/p right TKA 04/2013 & left TKA 09/2014  . Otitis externa   . Personal history of radiation therapy 2015   RIGHT breast-mammosite per pt  . Sleep difficulties    LUNESTA  HAS HELPED  . Vaginal cyst     Medications:  Scheduled:  . alum & mag hydroxide-simeth  15 mL Oral TID WC & HS  . DULoxetine  120 mg Oral Daily  . esomeprazole  40 mg Oral Daily  . furosemide  40 mg Oral Daily  . metoprolol tartrate  50 mg Oral BID  . raloxifene  60 mg Oral Daily  . rivaroxaban  20 mg Oral Q supper  . sertraline  50 mg Oral Daily  . sotalol  120 mg Oral Q12H  . Suvorexant  1 tablet Oral QHS    Assessment: 49 YOF found to have new Afib this admission  - not on anticoag PTA. CHADsVASc score is 3. Pharmacy consulted for Xarelto dosing.   SCr 1.16, CrCl using TBW ~70 mL/min and current dosing remains appropriate. No CBC since 3/26 but was stable at that time. Will recheck CBC with AM labs.   Goal of Therapy:  Monitor platelets by anticoagulation protocol: Yes   Plan:  Xarelto 20 mg daily starting tonight Monitor CBC and renal function Monitor for signs/symptoms of bleeding  Thank you for allowing pharmacy to be a part of this patient's care.  Alycia Rossetti, PharmD, BCPS Clinical Pharmacist Pager: [phone] Clinical phone for 11/09/2017 from 7a-3:30p: [phone]  If after 3:30p, please call main pharmacy at: [phone] 11/09/2017 11:11 AM

## 2017-11-09 NOTE — Progress Notes (Addendum)
Per pt and pts son, pt wears CPAP every night at home. Pt has not been on CPAP at night while here. Do see order in chart for PRN use. Will pass along to night shift tonight. Also paged IMTS to change order from PRN to PRN and continuous at night

## 2017-11-09 NOTE — Progress Notes (Signed)
Pt HR better controlled overnight, rate remaining mostly between 110-130, however rate increased to 140-150 with exertion.

## 2017-11-09 NOTE — Care Management Important Message (Signed)
Important Message  Patient Details  Name: Selena Bush MRN: [id number] Date of Birth: 1945/11/01   Medicare Important Message Given:  Yes    Orbie Pyo 11/09/2017, 1:05 PM

## 2017-11-09 NOTE — Consult Note (Addendum)
ELECTROPHYSIOLOGY CONSULT NOTE    Patient ID: [name] MRN: [id number], DOB/AGE: 1945-12-15 72 y.o.  Admit date: 11/04/2017 Date of Consult: 11/09/2017  Primary Physician: Wardell Honour, MD Primary Cardiologist: Rockey Situ Electrophysiologist: Lovena Le (new this admission)  Patient Profile: Selena Bush is a 72 y.o. female with a history of hypertension, hyperlipidemia, OSA (compliant with CPAP), breast cancer, and GERD who is being seen today for the evaluation of AF at the request of Dr Marlou Porch.  HPI:  Selena Bush is a 72 y.o. female with the above past medical history.  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 She is not very active at home but has no functional limitations. She has OSA and is compliant with CPAP at home but has not been ordered it here. On the day of admission, she left her son's house and when she got home developed GERD pain that was similar to what she has had in the past. This persisted despite taking Zantac and she called EMS for evaluation. She does not remember them making any comment about heart rate or rhythm on their arrival.  When in the ER she developed AF with RVR.  This has been relatively asymptomatic but persistent and rates have been difficult to control. She was started on Sotalol by Dr Tamala Julian and dose was increased yesterday to 120mg  twice daily by Dr Marlou Porch. She continues to have AF today with brief periods of SR. No significant pauses. She remains largely unaware of her AF even with rapid rates.  EP has been asked to evaluate for treatment options.   She has been slowly losing weight intentionally. She does not drink ETOH. BP is well controlled.   Echo this admission demonstrated EF 60-65%, no RWMA, mild MR, LA 42.    She denies chest pain, palpitations, dyspnea, PND, orthopnea, nausea, vomiting, dizziness, syncope, edema, weight gain, or early satiety.  Past Medical History:  Diagnosis Date  . Anxiety   .  Chronic diastolic CHF (congestive heart failure) ([place])    a. echo 09/2014: EF 70-62%, diastolic dysfunction, mild LVH, nl RV size & systolic function, mildly dilated LA (4.3 cm), mild MR/TR, mildly elevated PASP 36.7 mm Hg  . DDD (degenerative disc disease), cervical   . DDD (degenerative disc disease), lumbar   . Depression   . Diffuse cystic mastopathy 2014  . GERD (gastroesophageal reflux disease)   . Headache    rare  . HLD (hyperlipidemia)    a. statin intolerant 2/2 myalgias  . Hypercholesterolemia   . Hypertension   . Malignant neoplasm of upper-outer quadrant of female breast ([place]) 10/2012   Papillary DCIS, sentinel node negative. DR/PR positive. PARTIAL RIGHT MASTECTOMY FOR BREAST CANCER--HAD RADIATION - NO CHEMO --DR. [place] ONCOLOGIST  . Obesity   . OSA on CPAP   . Osteoarthritis of both knees    a. s/p right TKA 04/2013 & left TKA 09/2014  . Otitis externa   . Personal history of radiation therapy 2015   RIGHT breast-mammosite per pt  . Sleep difficulties    LUNESTA HAS HELPED  . Vaginal cyst      Surgical History:  Past Surgical History:  Procedure Laterality Date  . ABDOMINAL HYSTERECTOMY  1992   DUB; fibroids; endometriosis.  One remaining ovary.    [name] BREAST LUMPECTOMY Right 2015   Papillary DCIS, sentinel node negative. DR/PR positive. PARTIAL RIGHT MASTECTOMY FOR BREAST CANCER--HAD RADIATION - NO CHEMO --DR. [place] ONCOLOGIST  .  BREAST SURGERY Right March 2014   Wide excision,APB RT 10 mm papillary DCIS, ER/PR positive. Sentinel node negative. Partial breast radiation.  [name] CATARACT EXTRACTION, BILATERAL  02/13/2016   [place].  . CHOLECYSTECTOMY  1994  . COLONOSCOPY  2015   1 benign polyp-every 5 years/ Dr Candace Cruise  . ERCP  1995  . JOINT REPLACEMENT Right Sept 2014   knee  . TOTAL KNEE ARTHROPLASTY Right 04/16/2013   Procedure: RIGHT TOTAL KNEE ARTHROPLASTY;  Surgeon: Mauri Pole, MD;  Location: [facility];  Service: Orthopedics;  Laterality:  Right;  . TOTAL KNEE ARTHROPLASTY Left 09/15/2014   Procedure: LEFT TOTAL KNEE ARTHROPLASTY;  Surgeon: Mauri Pole, MD;  Location: [facility];  Service: Orthopedics;  Laterality: Left;  . TUBAL LIGATION  1979     Medications Prior to Admission  Medication Sig Dispense Refill Last Dose  . atenolol (TENORMIN) 50 MG tablet Take 1 tablet (50 mg total) by mouth daily. 90 tablet 1 11/03/2017 at 0800  . colchicine 0.6 MG tablet Take 1 tablet (0.6 mg total) by mouth 2 (two) times daily. (Patient taking differently: Take 0.6 mg by mouth 2 (two) times daily as needed (for gout). ) 30 tablet 0 Past Month at Unknown time  . esomeprazole (NEXIUM) 40 MG capsule Take 1 capsule (40 mg total) by mouth every morning. 90 capsule 3 11/03/2017 at Unknown time  . fluticasone (FLONASE) 50 MCG/ACT nasal spray Place 2 sprays into both nostrils daily as needed for rhinitis or allergies. 17 g 11 Past Week at Unknown time  . furosemide (LASIX) 20 MG tablet Take 1 tablet (20 mg total) by mouth daily. 90 tablet 1 11/03/2017 at Unknown time  . lisinopril-hydrochlorothiazide (PRINZIDE,ZESTORETIC) 20-12.5 MG tablet Take 1 tablet by mouth daily. 90 tablet 1 11/03/2017 at Unknown time  . raloxifene (EVISTA) 60 MG tablet TAKE 1 TABLET BY MOUTH EVERY DAY 90 tablet 3 11/03/2017 at Unknown time  . ranitidine (ZANTAC) 150 MG tablet Take 150 mg by mouth 2 (two) times daily as needed for heartburn.   11/03/2017 at Unknown time  . Suvorexant (BELSOMRA) 20 MG TABS Take 1 tablet by mouth at bedtime. 30 tablet 5 11/03/2017 at Unknown time  . tiZANidine (ZANAFLEX) 4 MG tablet TAKE 1 TABLET BY MOUTH AT SUPPER AND TAKE 1 TABLET BY MOUTH AT BEDTIME (Patient taking differently: Take 8 mg by mouth at bedtime. ) 180 tablet 5 11/03/2017 at Unknown time    Inpatient Medications:  . alum & mag hydroxide-simeth  15 mL Oral TID WC & HS  . DULoxetine  120 mg Oral Daily  . esomeprazole  40 mg Oral Daily  . furosemide  40 mg Oral Daily  . metoprolol tartrate  75  mg Oral BID  . raloxifene  60 mg Oral Daily  . rivaroxaban  20 mg Oral Q supper  . sertraline  50 mg Oral Daily  . sotalol  120 mg Oral Q12H  . Suvorexant  1 tablet Oral QHS    Allergies:  Allergies  Allergen Reactions  . Asa [Aspirin]   . Codeine     Gi problems   . Penicillins     anaphylaxis    . Statins Other (See Comments)    Leg pains  . Sulfa Antibiotics     anaphylaxis     Social History   Socioeconomic History  . Marital status: Widowed    Spouse name: Chrissie Noa  . Number of children: 2  . Years of education: College  .  Highest education level: Bachelor's degree (e.g., BA, AB, BS)  Occupational History  . Occupation: Retired  Scientific laboratory technician  . Financial resource strain: Not hard at all  . Food insecurity:    Worry: Never true    Inability: Never true  . Transportation needs:    Medical: No    Non-medical: No  Tobacco Use  . Smoking status: Never Smoker  . Smokeless tobacco: Never Used  . Tobacco comment: social smoker as a teen  Substance and Sexual Activity  . Alcohol use: No  . Drug use: No  . Sexual activity: Not Currently    Birth control/protection: Post-menopausal, Surgical  Lifestyle  . Physical activity:    Days per week: 7 days    Minutes per session: 20 min  . Stress: Only a little  Relationships  . Social connections:    Talks on phone: More than three times a week    Gets together: More than three times a week    Attends religious service: More than 4 times per year    Active member of club or organization: Yes    Attends meetings of clubs or organizations: More than 4 times per year    Relationship status: Widowed  . Intimate partner violence:    Fear of current or ex partner: No    Emotionally abused: No    Physically abused: No    Forced sexual activity: No  Other Topics Concern  . Not on file  Social History Narrative   Marital status: widowed since 09/16/2015      Children: 2 children (31, 23); 5 grandchild (4 in [place]; 1 in  [place]).      Lives: alone in townhome; 1 dog, 2 cats      Employment: psychiatric Education officer, museum; retired in 2015      Tobacco: teenager only      Alcohol:  None      Drugs: none      Exercise: walking in 2019; more active in 2019.  Walking daily small amounts.        ADLs: drives; independent with ADLs; no assistant devices      Advanced Directives: none; FULL CODE; no prolonged measures.   Does not need them.  Daughter/Heather Vista Deck is HCPOA.      Family History  Problem Relation Age of Onset  . Colon cancer Mother   . Cancer Mother 25       colon cancer  . Melanoma Father   . Cancer Father 47       melanoma  . Colon cancer Maternal Grandfather   . Breast cancer Maternal Grandfather      Review of Systems: All other systems reviewed and are otherwise negative except as noted above.  Physical Exam: Vitals:   11/09/17 0100 11/09/17 0500 11/09/17 0610 11/09/17 0800  BP: (!) 125/98  118/87 130/82  Pulse: 78  (!) 126 (!) 128  Resp: 16   (!) 23  Temp:    97.7 F (36.5 C)  TempSrc:    Oral  SpO2: 96%   98%  Weight:  223 lb 8.7 oz (101.4 kg)    Height:        GEN- The patient is obese appearing, alert and oriented x 3 today.   HEENT: normocephalic, atraumatic; sclera clear, conjunctiva pink; hearing intact; oropharynx clear; neck supple Lungs- Clear to ausculation bilaterally, normal work of breathing.  No wheezes, rales, rhonchi Heart- Irregular rate and rhythm  GI- soft, non-tender, non-distended, bowel sounds  present Extremities- no clubbing, cyanosis, or edema  MS- no significant deformity or atrophy Skin- warm and dry, no rash or lesion Psych- euthymic mood, full affect Neuro- strength and sensation are intact  Labs:   Lab Results  Component Value Date   WBC 9.6 11/07/2017   HGB 11.3 (L) 11/07/2017   HCT 33.6 (L) 11/07/2017   MCV 82.8 11/07/2017   PLT 319 11/07/2017    Recent Labs  Lab 11/05/17 0354  11/09/17 0312  NA 128*   < > 129*  K 3.2*   < >  3.6  CL 91*   < > 93*  CO2 23   < > 26  BUN 11   < > 20  CREATININE 1.24*   < > 1.16*  CALCIUM 9.1   < > 8.9  PROT 6.4*  --   --   BILITOT 1.1  --   --   ALKPHOS 66  --   --   ALT 10*  --   --   AST 17  --   --   GLUCOSE 144*   < > 106*   < > = values in this interval not displayed.      Radiology/Studies: Dg Chest 2 View  Result Date: 11/04/2017 CLINICAL DATA:  Chest pain/pressure. EXAM: CHEST - 2 VIEW COMPARISON:  Radiographs and CT 09/20/2014 FINDINGS: Chronic elevation of right hemidiaphragm with adjacent passive atelectasis. Mild cardiomegaly which is unchanged from prior. Left midlung scarring. No confluent airspace disease. No pleural effusion or pneumothorax. IMPRESSION: 1. No acute abnormality. 2. Chronic elevation of right hemidiaphragm. Unchanged cardiomegaly. 3. Left midlung scarring. Electronically Signed   By: Jeb Levering M.D.   On: 11/04/2017 06:55    EKG:AF (personally reviewed)  TELEMETRY: AF with periods of SR (personally reviewed)  Assessment/Plan: 1.  New onset atrial fibrillation (relatively asymptomatic) The patient has new onset atrial fibrillation that is relatively asymptomatic I am not convinced this is what caused her symptoms of GERD - these have resolved Agree with [place] for CHADS2VASC of at least 3 She has not been using CPAP here - I have ordered for tonight.   We discussed treatment options including continuing Sotalol for another 24 hours with use of CPAP, trying alternative AAD therapy (she has no known CAD, so could consider Flecainide or Tikosyn - would reserve amiodarone for now with few symptoms), ablation if she fails AAD therapy.  She would like to continue Sotalol for another 24 hours with CPAP and see if she can maintain SR which I think is reasonable. Keep K>3.9, Mg >1.8. QTc is stable on EKG - need repeat EKG in SR  Will increase rate control slightly today (add diltiazem 30mg  q6 hours) I would avoid IV diltiazem or amiodarone as she is  not symptomatic and we are trying to identify a strategy that will work at home.   2.  Obesity Body mass index is 40.89 kg/m. We discussed extensively lifestyle modification and how it relates to our ability to treat AF today. I have encouraged a 10% weight loss and increasing activity (walking at least 30 minutes per day, 5 days per week).  3.  OSA She is compliant with CPAP at home Ordered for here We reviewed impact of OSA on AF  4.  HTN Stable No change required today  Dr Lovena Le to see later today   Signed, Chanetta Marshall, NP 11/09/2017 1:07 PM  EP attending  Patient seen and examined.  Agree with the findings as  noted above by Chanetta Marshall, nurse practitioner cardiology.  The patient is a very pleasant obese 72 year old woman with paroxysmal atrial fibrillation.  She has a rapid ventricular response.  She has been treated with sotalol therapy with increasingly frequent periods of sinus rhythm.  She does have atrial fibrillation with a rapid ventricular response.  Her QT interval is stable.  The patient does have symptoms with her atrial fibrillation but does not have as much in the way of palpitations.  She has a history of sleep apnea but has been noncompliant with her CPAP in the hospital.  At this point, would recommend continuing sotalol.  She may do better with a class Ic antiarrhythmic like flecainide or propafenone, or dofetilide.  With her obesity and sleep apnea, catheter ablation would be unlikely to be of much benefit unless she can lose weight.  If her ventricular rates remain under better control, she can be discharged even if she is in atrial fibrillation.  Continue calcium channel blocker in conjunction with beta-blocker.  Crissie Sickles, MD

## 2017-11-09 NOTE — Progress Notes (Signed)
IMTS rounding on pt. Relayed that pts HR early this AM was 130s-140s before sotalol and now mainly in 110s-120s w/ increase during exertion.

## 2017-11-09 NOTE — Progress Notes (Addendum)
   Subjective:  Ms. Roskelley was seen laying in her bed this morning with her son at bedside. The patient states that she is doing well and that she has been able to move around when her heart rate is in normal range.   Objective:  Vital signs in last 24 hours: Vitals:   11/09/17 0100 11/09/17 0500 11/09/17 0610 11/09/17 0800  BP: (!) 125/98  118/87 130/82  Pulse: 78  (!) 126 (!) 128  Resp: 16   (!) 23  Temp:    97.7 F (36.5 C)  TempSrc:    Oral  SpO2: 96%   98%  Weight:  223 lb 8.7 oz (101.4 kg)    Height:       Physical Exam  Constitutional: She appears well-developed and well-nourished. No distress.  HENT:  Head: Normocephalic and atraumatic.  Eyes: Conjunctivae are normal.  Cardiovascular: Normal heart sounds. An irregularly irregular rhythm present. Tachycardia present.  No murmur heard. Respiratory: Effort normal and breath sounds normal. No respiratory distress. She has no wheezes.  GI: Soft. Bowel sounds are normal. She exhibits no distension. There is no tenderness.  Musculoskeletal: She exhibits no edema.  Neurological: She is alert.  Skin: She is not diaphoretic. No erythema.  Psychiatric: She has a normal mood and affect. Her behavior is normal. Judgment and thought content normal.   Assessment/Plan:  Ms Lightcap is a 72 y.o f withchronicHFpEF, essential htn,papillaryDCISDR/PR positive s/p partial right mastectomy and radiation,HLD, OSA on cpappresented with atypical chest pain that is being managed with gerd and anxiety medication. During hospitalization she developed paroxysmal afib with rvr that she has been placed on sotalol, metoprolol, and xarelto for.   Paroxysmal afib Patient continues to be in atrial fib with rvr (rates 110-120s). Cardiology increased sotalol from 80 to 120mg  bid and increased metoprolol from 50 to 75mg . Still needs to be evaluated by attending. Cardiology may contact EP today for further support.   EKG this morning showed QTc=507 which  maybe slightly inaccurate due to concomitant tachycardia.   -Appreciate Cardiology following and their recs -continuexarelto   Atypical chest pain Resolved  -continueHydroxyzine 25mg  tid prn for anxiety -Nexium 40mg  qd -continueGI cocktail 70mL TID prn  Chronic diastolic chf The patient put out 551mlL over the past 24 hrs, however her weight has decreased from 222lbs to 223lbs.  TTE (3/24) showed moderately calcified mitral annulus that can contribute to patients afib. It also showeed lvef 60-65%, mild lvh, and no regional wall motion abnormalities.  -Continue po Lasix40mg  qd  Hyponatremialikely secondary to SIADH The patient's sodiumis 129 today.   -Fluid restriction to 1268mL  Essential Hypertension The patient's blood pressure this morning is112-143/91-126.  -held home hctz due to hyponatremia concern -lisinopril 20mg  qd  Anxiety Major Depressive Disorder  -Continue home suvorexant 1 tablet qhs -Hydroxyzine 25mg  tid prn for anxiety  OSA -cpap nightly  Dispo: Anticipated discharge in approximately 1-2 day(s).   Lars Mage, MD 11/09/2017, 1:07 PM Pager: [phone]

## 2017-11-09 NOTE — Progress Notes (Signed)
PT Cancellation Note  Patient Details Name: Selena Bush MRN: [id number] DOB: 11-26-1945   Cancelled Treatment:    Reason Eval/Treat Not Completed: Medical issues which prohibited therapy(HR 148bpm at rest, in afib. Nurse asked PT to not work with pt.) Son and pt enquiring about CPAP as pt uses it at night.  Noted order in chart but it has not been brought to pt.  Notified nurse and she is calling MD.     Denice Paradise 11/09/2017, 10:22 AM [facility] [phone] (pager)

## 2017-11-09 NOTE — Consult Note (Signed)
[facility] CM Primary Care Navigator  11/09/2017  Selena Bush Mar 20, 1946 [id number]  Met with patientand daughter Selena Bush) at the bedsideto identify possible discharge needs. Patient reportsthat she had "chest pains and pressure" that had led to this admission. (treated for atrial fibrillation).  Patient endorsesDr.Kristi Tamala Julian with [facility] as her primary care provider.   [facility] [address] [facility] with issues of affordability with 2 of her medications (Belsomra and Evista).   Patient states managing herown medications at homestraight out of the containers.  Patient reports thatshe has been driving prior to admission. Her daughter or son Selena Bush) can providetransportation to herdoctors' appointments if needed after discharge.  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  Anticipated plan for discharge is home per patient.  Patient voiced understanding to call primary care provider's office once she returns home, for a post discharge follow-up appointment within 1- 2 weeks or sooner if needed.Patient letter (with PCP's contact number) was provided as a reminder.  Explained to patient about [facility] CM services available for healthmanagement and resources. Patient and daughter verbalized interest to check other options for medication assistance for Belsomra and Evista which she specifically was having trouble affording. Patient states would not like to miss or skip her doses especially that she will be placed on more medications on discharge.   Patient mentioned being previously on medication assistance program but did not pursue further because of too much requirements.  She reports that provider had tried putting her on other substitute medication  for Belsomra but "were ineffective over time".   Patient has history of hypertension, hyperlipidemia, obstructive sleep apnea (compliant with CPAP), breast cancer (status post partial right mastectomy and radiation), chronic diastolic heart failure, anxiety, major depressive disorder and reflux disorder.  Patient verbally agreedfor [place] and to be assisted with medication issues (of affordability).  [facility]erralto [facility]wasmade for medication review, further medication assistance (affordability) and explore other options to help afford medications after discharge.  Patientexpressed understandingto seekreferralfrom primary care provider to [facility] care management services ifdeemed necessaryand appropriatefor further assistance in thefuture.  [facility] care management contact information provided for future needs that may arise.   For additional questions please contact:  Edwena Felty A. Molly Maselli, BSN, RN-BC [facility] PRIMARY CARE Navigator Cell: [phone]

## 2017-11-09 NOTE — Progress Notes (Addendum)
Progress Note  Patient Name: Selena Bush Date of Encounter: 11/09/2017  Primary Cardiologist: No primary care provider on file. Remotely Dr. Rockey Situ  Subjective   Denies any CP for the past few days. No SOB.   Inpatient Medications    Scheduled Meds: . alum & mag hydroxide-simeth  15 mL Oral TID WC & HS  . DULoxetine  120 mg Oral Daily  . esomeprazole  40 mg Oral Daily  . furosemide  40 mg Oral Daily  . metoprolol tartrate  50 mg Oral BID  . raloxifene  60 mg Oral Daily  . rivaroxaban  20 mg Oral Q supper  . sertraline  50 mg Oral Daily  . sotalol  120 mg Oral Q12H  . Suvorexant  1 tablet Oral QHS   Continuous Infusions: . diltiazem (CARDIZEM) infusion Stopped (11/08/17 0551)   PRN Meds: acetaminophen **OR** acetaminophen, gi cocktail, hydrOXYzine, ondansetron **OR** ondansetron (ZOFRAN) IV, senna-docusate, tiZANidine   Vital Signs    Vitals:   11/09/17 0100 11/09/17 0500 11/09/17 0610 11/09/17 0800  BP: (!) 125/98  118/87 130/82  Pulse: 78  (!) 126 (!) 128  Resp: 16   (!) 23  Temp:    97.7 F (36.5 C)  TempSrc:    Oral  SpO2: 96%   98%  Weight:  223 lb 8.7 oz (101.4 kg)    Height:        Intake/Output Summary (Last 24 hours) at 11/09/2017 1049 Last data filed at 11/09/2017 0900 Gross per 24 hour  Intake 480 ml  Output 550 ml  Net -70 ml   Filed Weights   11/07/17 0500 11/08/17 0500 11/09/17 0500  Weight: 227 lb 15.3 oz (103.4 kg) 222 lb 0.1 oz (100.7 kg) 223 lb 8.7 oz (101.4 kg)    Telemetry    afib with multiple conversion back to sinus rhythm, multiple NSVT, longest 6 beats - Personally Reviewed  ECG    afib with QTc 507 - Personally Reviewed  Physical Exam   GEN: No acute distress.   Neck: No JVD Cardiac: irregularly irregular, no murmurs, rubs, or gallops.  Respiratory: Clear to auscultation bilaterally. GI: Soft, nontender, non-distended  MS: No edema; No deformity. Neuro:  Nonfocal  Psych: Normal affect   Labs     Chemistry Recent Labs  Lab 11/05/17 0354  11/07/17 1950 11/08/17 0319 11/09/17 0312  NA 128*   < > 129* 130* 129*  K 3.2*   < > 4.4 3.3* 3.6  CL 91*   < > 96* 93* 93*  CO2 23   < > 22 24 26   GLUCOSE 144*   < > 103* 119* 106*  BUN 11   < > 16 14 20   CREATININE 1.24*   < > 1.15* 1.04* 1.16*  CALCIUM 9.1   < > 8.9 8.9 8.9  PROT 6.4*  --   --   --   --   ALBUMIN 3.2*  --   --   --   --   AST 17  --   --   --   --   ALT 10*  --   --   --   --   ALKPHOS 66  --   --   --   --   BILITOT 1.1  --   --   --   --   GFRNONAA 43*   < > 47* 54* 47*  GFRAA 50*   < > 55* >60 54*  ANIONGAP 14   < >  11 13 10    < > = values in this interval not displayed.     Hematology Recent Labs  Lab 11/04/17 0604 11/05/17 0354 11/07/17 0648  WBC 17.1* 12.0* 9.6  RBC 4.26 4.06 4.06  HGB 11.5* 11.1* 11.3*  HCT 36.0 33.9* 33.6*  MCV 84.5 83.5 82.8  MCH 27.0 27.3 27.8  MCHC 31.9 32.7 33.6  RDW 13.8 13.7 14.2  PLT 312 252 319    Cardiac Enzymes Recent Labs  Lab 11/04/17 1005 11/04/17 1908 11/04/17 2246  TROPONINI <0.03 <0.03 <0.03    Recent Labs  Lab 11/04/17 0614  TROPIPOC 0.00     BNPNo results for input(s): BNP, PROBNP in the last 168 hours.   DDimer No results for input(s): DDIMER in the last 168 hours.   Radiology    No results found.  Cardiac Studies   Echo 11/05/2017 LV EF: 60% -   65%  ------------------------------------------------------------------- Indications:      Chest pain 786.51.  ------------------------------------------------------------------- History:   PMH:  CKD.  Congestive heart failure.  Risk factors: Hypertension. Obese.  ------------------------------------------------------------------- Study Conclusions  - Left ventricle: The cavity size was normal. Wall thickness was   increased in a pattern of mild LVH. Systolic function was normal.   The estimated ejection fraction was in the range of 60% to 65%.   Wall motion was normal; there were  no regional wall motion   abnormalities. The study is not technically sufficient to allow   evaluation of LV diastolic function. - Aortic valve: Moderate calcification involving the noncoronary   cusp. Noncoronary cusp mobility was restricted. - Mitral valve: Moderately calcified annulus. There was mild   regurgitation. - Left atrium: The atrium was mildly dilated. - Right atrium: Central venous pressure (est): 3 mm Hg. - Tricuspid valve: There was mild regurgitation. - Pulmonary arteries: Systolic pressure could not be accurately   estimated. - Pericardium, extracardiac: There was no pericardial effusion.  Patient Profile     72 y.o. female with a hx of diastolic heart failure (per echo in 2014 with EF 60-65% and DD), HTN, HLD, OSA on CPAP, right breast CA s/p partial mastectomy and radiation (no chemo), bilateral osteoarthritis s/p right TKA 04/16/13 and left TKA 09/15/14 and GERD who is being seen today for the evaluation of atrial fibrillation with RVR and chest pain at the request of Dr. Maricela Bo  Assessment & Plan    1. New atrial fibrillation with RVR  - started on sotalol, dose increased to 120mg  BID  - QTc 507 this morning on 2 EKG while in afib with RVR, however QTc very difficult to calculate due to tachycardia.   - she converted out of afib back to sinus rhythm at least 3 days between 9:55AM to 10:15AM this morning, however quickly went back into afib again. QTC while in sinus rhythm was 440 ms. Would like to avoid cardioversion as it appears her heart is actively trying of come out of afib. I will give additional 25mg  metoprolol now and increase metoprolol to 75mg  BID. Will discuss with Dr. Marlou Porch regarding whether to back off on Sotalol. May require EP consult  2. Atypical chest pain: per Dr. Tamala Julian, once rate under control, consider outpatient myoview vs coronary CT. She continue to have intermittent NSVT longest 6 beats.  3. Chronic diastolic HF: euvolemic on exam  4.  Hyponatremia: per primary team  5. OSA on CPAP   For questions or updates, please contact [place] Please consult [email or web address] for contact info  under Cardiology/STEMI.      Hilbert Corrigan, PA  11/09/2017, 10:49 AM    Personally seen and examined. Agree with above.  Less SOB, comfortable No CP Exam: Alert, Irreg irreg, periods of NSR, lungs clear, no edema Labs: Creat 1.16, Trop neg ECHO: EF 65%  A/P:  PAF  - in and out of AFIB  - continue sotalol 120 BID.   - QT is 471ms  - Had EP to see. Still with RVR when in AFIB. Discussed with [employer]. Will continue sotalol for now. CPAP.   Candee Furbish, MD

## 2017-11-10 DIAGNOSIS — Z882 Allergy status to sulfonamides status: Secondary | ICD-10-CM

## 2017-11-10 DIAGNOSIS — Z888 Allergy status to other drugs, medicaments and biological substances status: Secondary | ICD-10-CM

## 2017-11-10 DIAGNOSIS — Z880 Allergy status to penicillin: Secondary | ICD-10-CM

## 2017-11-10 DIAGNOSIS — Z886 Allergy status to analgesic agent status: Secondary | ICD-10-CM

## 2017-11-10 DIAGNOSIS — Z885 Allergy status to narcotic agent status: Secondary | ICD-10-CM

## 2017-11-10 LAB — BASIC METABOLIC PANEL
Anion gap: 13 (ref 5–15)
BUN: 22 mg/dL — AB (ref 6–20)
CALCIUM: 9.1 mg/dL (ref 8.9–10.3)
CO2: 24 mmol/L (ref 22–32)
CREATININE: 1.2 mg/dL — AB (ref 0.44–1.00)
Chloride: 91 mmol/L — ABNORMAL LOW (ref 101–111)
GFR calc Af Amer: 52 mL/min — ABNORMAL LOW (ref >60)
GFR, EST NON AFRICAN AMERICAN: 45 mL/min — AB (ref >60)
GLUCOSE: 102 mg/dL — AB (ref 65–99)
Potassium: 3.7 mmol/L (ref 3.5–5.1)
Sodium: 128 mmol/L — ABNORMAL LOW (ref 135–145)

## 2017-11-10 LAB — CBC
HCT: 34.3 % — ABNORMAL LOW (ref 36.0–46.0)
Hemoglobin: 11 g/dL — ABNORMAL LOW (ref 12.0–15.0)
MCH: 26.8 pg (ref 26.0–34.0)
MCHC: 32.1 g/dL (ref 30.0–36.0)
MCV: 83.5 fL (ref 78.0–100.0)
Platelets: 333 10*3/uL (ref 150–400)
RBC: 4.11 MIL/uL (ref 3.87–5.11)
RDW: 13.6 % (ref 11.5–15.5)
WBC: 10 10*3/uL (ref 4.0–10.5)

## 2017-11-10 LAB — MAGNESIUM: Magnesium: 2.1 mg/dL (ref 1.7–2.4)

## 2017-11-10 MED ORDER — SERTRALINE HCL 50 MG PO TABS: 50.0000 mg | ORAL_TABLET | Freq: Every day | ORAL | 0 refills | Status: DC

## 2017-11-10 MED ORDER — RIVAROXABAN 20 MG PO TABS: 20.0000 mg | ORAL_TABLET | Freq: Every day | ORAL | 1 refills | Status: DC

## 2017-11-10 MED ORDER — DILTIAZEM HCL ER COATED BEADS 180 MG PO CP24: 180.0000 mg | ORAL_CAPSULE | Freq: Every day | ORAL | 0 refills | Status: DC

## 2017-11-10 MED ORDER — FUROSEMIDE 40 MG PO TABS: 40.0000 mg | ORAL_TABLET | Freq: Every day | ORAL | 0 refills | Status: DC

## 2017-11-10 MED ORDER — DILTIAZEM HCL ER COATED BEADS 180 MG PO CP24
180.0000 mg | ORAL_CAPSULE | Freq: Every day | ORAL | Status: DC
  Administered 2017-11-10: 180 mg via ORAL
  Filled 2017-11-10: qty 1

## 2017-11-10 MED ORDER — SOTALOL HCL 120 MG PO TABS: 120.0000 mg | ORAL_TABLET | Freq: Two times a day (BID) | ORAL | 0 refills | Status: DC

## 2017-11-10 MED ORDER — METOPROLOL TARTRATE 75 MG PO TABS: 75.0000 mg | ORAL_TABLET | Freq: Two times a day (BID) | ORAL | 0 refills | Status: DC

## 2017-11-10 NOTE — Discharge Instructions (Signed)
It was a pleasure to take care of you [name]. During your hospitalization you were taken care of for atrial fibrillation. Please take your medication as indicated on your discharge paperwork. Please also make sure to limit your fluid intake to 1577mL.   You have an appointment set up with the [facility].  Multiple studies have shown that being followed by a dedicated atrial fibrillation clinic in addition to the standard care you receive from your other physicians improves health. We believe that enrollment in the atrial fibrillation clinic will allow [place] to better care for you.   The phone number to the [facility] is [phone]. The clinic is staffed Monday through Friday from 8:30am to 5pm.  Parking Directions: The clinic is located in the Heart and Vascular Building connected to [facility]. 1)From [address] turn on to [address] and go to the 3rd entrance  (Heart and Vascular entrance) on the right. 2)Look to the right for Heart &Vascular Parking Garage. 3)A code for the entrance is required please call the clinic to receive this.   4)Take the elevators to the 1st floor. Registration is in the room with the glass walls at the end of the hallway.  If you have any trouble parking or locating the clinic, please dont hesitate to call [phone].  Atrial Fibrillation Atrial fibrillation is a type of irregular or rapid heartbeat (arrhythmia). In atrial fibrillation, the heart quivers continuously in a chaotic pattern. This occurs when parts of the heart receive disorganized signals that make the heart unable to pump blood normally. This can increase the risk for stroke, heart failure, and other heart-related conditions. There are different types of atrial fibrillation, including:  Paroxysmal atrial fibrillation. This type starts suddenly, and it usually stops on its own shortly after it starts.  Persistent atrial fibrillation. This type often  lasts longer than a week. It may stop on its own or with treatment.  Long-lasting persistent atrial fibrillation. This type lasts longer than 12 months.  Permanent atrial fibrillation. This type does not go away.  Talk with your health care provider to learn about the type of atrial fibrillation that you have. What are the causes? This condition is caused by some heart-related conditions or procedures, including:  A heart attack.  Coronary artery disease.  Heart failure.  Heart valve conditions.  High blood pressure.  Inflammation of the sac that surrounds the heart (pericarditis).  Heart surgery.  Certain heart rhythm disorders, such as Wolf-Parkinson-White syndrome.  Other causes include:  Pneumonia.  Obstructive sleep apnea.  Blockage of an artery in the lungs (pulmonary embolism, or PE).  Lung cancer.  Chronic lung disease.  Thyroid problems, especially if the thyroid is overactive (hyperthyroidism).  Caffeine.  Excessive alcohol use or illegal drug use.  Use of some medicines, including certain decongestants and diet pills.  Sometimes, the cause cannot be found. What increases the risk? This condition is more likely to develop in:  People who are older in age.  People who smoke.  People who have diabetes mellitus.  People who are overweight (obese).  Athletes who exercise vigorously.  What are the signs or symptoms? Symptoms of this condition include:  A feeling that your heart is beating rapidly or irregularly.  A feeling of discomfort or pain in your chest.  Shortness of breath.  Sudden light-headedness or weakness.  Getting tired easily during exercise.  In some cases, there are no symptoms. How is this diagnosed? Your health care  provider may be able to detect atrial fibrillation when taking your pulse. If detected, this condition may be diagnosed with:  An electrocardiogram (ECG).  A Holter monitor test that records your  heartbeat patterns over a 24-hour period.  Transthoracic echocardiogram (TTE) to evaluate how blood flows through your heart.  Transesophageal echocardiogram (TEE) to view more detailed images of your heart.  A stress test.  Imaging tests, such as a CT scan or chest X-ray.  Blood tests.  How is this treated? The main goals of treatment are to prevent blood clots from forming and to keep your heart beating at a normal rate and rhythm. The type of treatment that you receive depends on many factors, such as your underlying medical conditions and how you feel when you are experiencing atrial fibrillation. This condition may be treated with:  Medicine to slow down the heart rate, bring the hearts rhythm back to normal, or prevent clots from forming.  Electrical cardioversion. This is a procedure that resets your hearts rhythm by delivering a controlled, low-energy shock to the heart through your skin.  Different types of ablation, such as catheter ablation, catheter ablation with pacemaker, or surgical ablation. These procedures destroy the heart tissues that send abnormal signals. When the pacemaker is used, it is placed under your skin to help your heart beat in a regular rhythm.  Follow these instructions at home:  Take over-the counter and prescription medicines only as told by your health care provider.  If your health care provider prescribed a blood-thinning medicine (anticoagulant), take it exactly as told. Taking too much blood-thinning medicine can cause bleeding. If you do not take enough blood-thinning medicine, you will not have the protection that you need against stroke and other problems.  Do not use tobacco products, including cigarettes, chewing tobacco, and e-cigarettes. If you need help quitting, ask your health care provider.  If you have obstructive sleep apnea, manage your condition as told by your health care provider.  Do not drink alcohol.  Do not drink  beverages that contain caffeine, such as coffee, soda, and tea.  Maintain a healthy weight. Do not use diet pills unless your health care provider approves. Diet pills may make heart problems worse.  Follow diet instructions as told by your health care provider.  Exercise regularly as told by your health care provider.  Keep all follow-up visits as told by your health care provider. This is important. How is this prevented?  Avoid drinking beverages that contain caffeine or alcohol.  Avoid certain medicines, especially medicines that are used for breathing problems.  Avoid certain herbs and herbal medicines, such as those that contain ephedra or ginseng.  Do not use illegal drugs, such as cocaine and amphetamines.  Do not smoke.  Manage your high blood pressure. Contact a health care provider if:  You notice a change in the rate, rhythm, or strength of your heartbeat.  You are taking an anticoagulant and you notice increased bruising.  You tire more easily when you exercise or exert yourself. Get help right away if:  You have chest pain, abdominal pain, sweating, or weakness.  You feel nauseous.  You notice blood in your vomit, bowel movement, or urine.  You have shortness of breath.  You suddenly have swollen feet and ankles.  You feel dizzy.  You have sudden weakness or numbness of the face, arm, or leg, especially on one side of the body.  You have trouble speaking, trouble understanding, or both (aphasia).  Your face or your eyelid droops on one side. These symptoms may represent a serious problem that is an emergency. Do not wait to see if the symptoms will go away. Get medical help right away. Call your local emergency services (911 in the [place] Do not drive yourself to the hospital. This information is not intended to replace advice given to you by your health care provider. Make sure you discuss any questions you have with your health care provider. Document  Released: 08/01/2005 Document Revised: 12/09/2015 Document Reviewed: 11/26/2014 Elsevier Interactive Patient Education  [employer].

## 2017-11-10 NOTE — Progress Notes (Signed)
Patient discharged home with her daughter. All discharge instructions discussed with patient and her daughter to their satisfaction. Patient on room air. Denies SOB, pain. All personal belongings sent with patient.

## 2017-11-10 NOTE — Care Management Note (Signed)
Case Management Note  Patient Details  Name: IVADELL GAUL MRN: [id number] Date of Birth: 25-Jun-1946  Subjective/Objective:                    Action/Plan: Pt discharging home with self care. Pt states she has lots of support at home.  CM provided her another Xarelto card. Pt unable to locate the first card she had received. Daughter at the bedside and will provide transportation home.   Expected Discharge Date:  11/10/17               Expected Discharge Plan:  Home/Self Care  In-House Referral:     Discharge planning Services  CM Consult, Medication Assistance(Xarelto card)  Post Acute Care Choice:    Choice offered to:     DME Arranged:    DME Agency:     HH Arranged:    HH Agency:     Status of Service:  Completed, signed off  If discussed at [employer] of Stay Meetings, dates discussed:    Additional Comments:  Pollie Friar, RN 11/10/2017, 12:50 PM

## 2017-11-10 NOTE — Discharge Summary (Addendum)
Name: Selena Bush MRN: [id number] DOB: 1945/10/10 72 y.o. PCP: Wardell Honour, MD  Date of Admission: 11/04/2017  5:55 AM Date of Discharge: 11/10/17 Attending Physician: Oda Kilts, MD  Discharge Diagnosis:  Principal Problem: Atrial fibrillation with RVR ([place]) Hyponatremia secondary to SIADH Chronic diastolic heart failure  Active Problems:   Sleep apnea   GERD (gastroesophageal reflux disease)   Hypokalemia   Anxiety   Atypical chest pain   Discharge Medications: Allergies as of 11/10/2017      Reactions   Asa [aspirin]    Codeine    Gi problems   Penicillins    anaphylaxis    Statins Other (See Comments)   Leg pains   Sulfa Antibiotics    anaphylaxis       Medication List    STOP taking these medications   atenolol 50 MG tablet Commonly known as:  TENORMIN     TAKE these medications   colchicine 0.6 MG tablet Take 1 tablet (0.6 mg total) by mouth 2 (two) times daily. What changed:    when to take this  reasons to take this   diltiazem 180 MG 24 hr capsule Commonly known as:  CARDIZEM CD Take 1 capsule (180 mg total) by mouth daily. Start taking on:  11/11/2017   esomeprazole 40 MG capsule Commonly known as:  NEXIUM Take 1 capsule (40 mg total) by mouth every morning.   fluticasone 50 MCG/ACT nasal spray Commonly known as:  FLONASE Place 2 sprays into both nostrils daily as needed for rhinitis or allergies.   furosemide 40 MG tablet Commonly known as:  LASIX Take 1 tablet (40 mg total) by mouth daily. Start taking on:  11/11/2017 What changed:    medication strength  how much to take   lisinopril-hydrochlorothiazide 20-12.5 MG tablet Commonly known as:  PRINZIDE,ZESTORETIC Take 1 tablet by mouth daily.   Metoprolol Tartrate 75 MG Tabs Take 75 mg by mouth 2 (two) times daily.   raloxifene 60 MG tablet Commonly known as:  EVISTA TAKE 1 TABLET BY MOUTH EVERY DAY   ranitidine 150 MG tablet Commonly known as:   ZANTAC Take 150 mg by mouth 2 (two) times daily as needed for heartburn.   rivaroxaban 20 MG Tabs tablet Commonly known as:  XARELTO Take 1 tablet (20 mg total) by mouth daily with supper.   sertraline 50 MG tablet Commonly known as:  ZOLOFT Take 1 tablet (50 mg total) by mouth daily. Start taking on:  11/11/2017   sotalol 120 MG tablet Commonly known as:  BETAPACE Take 1 tablet (120 mg total) by mouth every 12 (twelve) hours.   Suvorexant 20 MG Tabs Commonly known as:  BELSOMRA Take 1 tablet by mouth at bedtime.   tiZANidine 4 MG tablet Commonly known as:  ZANAFLEX TAKE 1 TABLET BY MOUTH AT SUPPER AND TAKE 1 TABLET BY MOUTH AT BEDTIME What changed:    how much to take  how to take this  when to take this  additional instructions       Disposition and follow-up:   Selena Bush was discharged from [facility] in stable condition.  At the hospital follow up visit please address:  1.  Paroxysmal atrial fibrillation with rvr: please make sure the patient is continuing to take metoprolol 75mg  bid, sotalol 120mg  bid, xarelto 20mg  qd, diltiazem 180mg . The patient should follow up with cardiologist.   Chronic diastolic heart failure:  Please make sure the patient takes  lasix 40mg  qd  Atypical chest pain: please make sure that the patient does not have repeat chest pain. Please make sure she takes nexium.  Hyponatremia secondary to SIADH: please make sure the patient continues to stop hctz. Please ensure that the patient keeps a fluid restriction of 1523ml.  2.  Labs / imaging needed at time of follow-up: bmp to check sodium levels  3.  Pending labs/ test needing follow-up: none  Follow-up Appointments: Follow-up Information    [facility] Follow up on 11/14/2017.   Specialty:  Cardiology Why:  at [facility] information: [address] [id number] Danice G[place] [address] [phone]       Wardell Honour, MD. Schedule an appointment as soon as possible for a visit in 1 week(s).   Specialty:  Family Medicine Contact information: [place] [address] [place] Hospital Course by problem list:   Atrial Fibrillation with RVR During her hospitalization the patient converted into atrial fibrillation. She has a CHADSVASC=3 which places her at a 5.9% risk of thromboembolic event if not on a anticoagulant. The patient was placed on a diltiazem drip, metoprolol and xarelto. TTE (11/05/17) showed lvef=60-65%, mild lvh, no regional wall motion abnormalities, moderately calcified mitral annulus, and mildly dilated left atrium, and moderate calcification of aortic valve. The patient continued to convert back and forth into atrial fibrillation. She was evaluated by cardiology and EP specialists during the hospitalization and was stabilized in normal sinus rhythm on sotalol, metoprolol, diltiazem, and xarelto. She was discharged with close follow up with cardiology.   Atypical Chest pain The patient presented with substernal, pressure like pain that radiates up her chin. The patient was admitted for an acs rule out. She did not have any st or t wave changes on ekg and her troponins were negative. She was started on hydroxyzine, gi cocktail, and given sucralfate.   Hyponatremia secondary to SIADH The patient presented with a sodium level of 124. Her Ur na=17, ur cr=262, ur osm=494, serum osm=473. According to the patient's urinary and sodium studies the patient's urine osm being higher than serum osm places concern for SIADH. The patient's hctz was discontinued. She was placed on a fluid restriction and discharged with fluid restriction of 1520ml.  Chronic diastolic heart failure The patient's echo showed moderately calcified mitral annulus, lvef 60-65%, mild lvh, and no regional wall motion abnormalities.She was diuresed during hospitalization a total of 1.5L. Her  weight on day of discharge was 227lbs. She was discharged on lasix 40mg  qd and metoprolol 75mg  bid.   Discharge Vitals:   BP (!) 159/98   Pulse 70   Temp 98.5 F (36.9 C) (Axillary)   Resp 18   Ht 5\' 2"  (1.575 m)   Wt 227 lb 1.2 oz (103 kg)   SpO2 100%   BMI 41.53 kg/m   Pertinent Labs, Studies, and Procedures:  CBC Latest Ref Rng & Units 11/12/2017 11/10/2017 11/07/2017  WBC 4.0 - 10.5 K/uL 10.4 10.0 9.6  Hemoglobin 12.0 - 15.0 g/dL 10.4(L) 11.0(L) 11.3(L)  Hematocrit 36.0 - 46.0 % 32.7(L) 34.3(L) 33.6(L)  Platelets 150 - 400 K/uL 403(H) 333 319   BMP Latest Ref Rng & Units 11/12/2017 11/10/2017 11/09/2017  Glucose 65 - 99 mg/dL 136(H) 102(H) 106(H)  BUN 6 - 20 mg/dL 29(H) 22(H) 20  Creatinine 0.44 - 1.00 mg/dL 1.19(H) 1.20(H) 1.16(H)  BUN/Creat Ratio 12 - 28 - - -  Sodium 135 - 145 mmol/L 127(L) 128(L) 129(L)  Potassium 3.5 - 5.1 mmol/L 3.7 3.7 3.6  Chloride 101 - 111 mmol/L 92(L) 91(L) 93(L)  CO2 22 - 32 mmol/L 24 24 26   Calcium 8.9 - 10.3 mg/dL 8.9 9.1 8.9   Chest xray (11/04/17): 1. No acute abnormality. 2. Chronic elevation of right hemidiaphragm. Unchanged cardiomegaly. 3. Left midlung scarring.  Discharge Instructions: Discharge Instructions    (HEART FAILURE PATIENTS) Call MD:  Anytime you have any of the following symptoms: 1) 3 pound weight gain in 24 hours or 5 pounds in 1 week 2) shortness of breath, with or without a dry hacking cough 3) swelling in the hands, feet or stomach 4) if you have to sleep on extra pillows at night in order to breathe.   Complete by:  As directed    AMB Referral to [facility]   Complete by:  As directed    Patient has history of hypertension, hyperlipidemia, obstructive sleep apnea (compliant with CPAP), breast cancer (status post partial right mastectomy and radiation), chronic diastolic heart failure, anxiety, major depressive disorder and reflux disorder.  Patient and daughter verbalized interest to check other options for  medication assistance for Belsomra and Evista which she specifically was having trouble affording. Patient states would not like to miss or skip her doses especially that she will be placed on more medications on discharge.   Patient mentioned being previously on medication assistance program but did not pursue further because of too much requirements.  She reports that provider had tried putting her on other substitute medication for Belsomra but "were ineffective over time".   Patient verbally agreedfor [facility] and to be assisted with medication issues (of affordability) after discharge.    Reason for consult:  Referralto [facility] pharmacy for medication review, further medication assistance (affordability) and explore other options to help afford medications after discharge.   Diagnoses of:  Other   Other Diagnosis:  Paroxysmal atrial fibrillation, Anxiety, Major Depressive Disorder, hx. breast cancer (status post partial right mastectomy and radiation)   Expected date of contact:  1-3 days (reserved for hospital discharges)   Call MD for:  difficulty breathing, headache or visual disturbances   Complete by:  As directed    Call MD for:  extreme fatigue   Complete by:  As directed    Call MD for:  persistant dizziness or light-headedness   Complete by:  As directed    Call MD for:  persistant nausea and vomiting   Complete by:  As directed    Call MD for:  severe uncontrolled pain   Complete by:  As directed    Diet - low sodium heart healthy   Complete by:  As directed    Increase activity slowly   Complete by:  As directed       Signed: Lars Mage, MD 11/10/2017, 2:09 PM   Pager: [phone]

## 2017-11-10 NOTE — Progress Notes (Addendum)
   Subjective: Ms. Vossler was seen sitting up in chair this morning and doing well. She stated that she has been able to walk around without any difficulty. She feels that her chest pain has resolved.  Objective:  Vital signs in last 24 hours: Vitals:   11/09/17 2345 11/10/17 0031 11/10/17 0500 11/10/17 0619  BP:  100/66  (!) 159/98  Pulse: 71 65  70  Resp: 20 18    Temp:  98.6 F (37 C)    TempSrc:  Oral    SpO2: 98% 100%    Weight:  224 lb 13.9 oz (102 kg) 227 lb 1.2 oz (103 kg)   Height:       Physical Exam  Constitutional: She appears well-developed and well-nourished. No distress.  HENT:  Head: Normocephalic and atraumatic.  Eyes: Conjunctivae are normal.  Cardiovascular: Normal rate, regular rhythm and normal heart sounds.  Respiratory: Effort normal and breath sounds normal. No respiratory distress. She has no wheezes.  GI: Soft. Bowel sounds are normal. She exhibits no distension. There is no tenderness.  Neurological: She is alert.  Skin: She is not diaphoretic. No erythema.  Psychiatric: She has a normal mood and affect. Her behavior is normal. Judgment and thought content normal.   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 During hospitalization she developed paroxysmal afib with rvr that she has been placed on sotalol, metoprolol, and xarelto for.   Paroxysmal afib with RVR The patient converted to nsr yesterday evening and has remained in regular rhythm. Her heart rates have also remained normal ranging 60-80s. The patient is medically stable to discharge today.   Patient was evaluated by EP NP today who stated that they have cleared patient for discharge home with diltiazem 180mg  qd. They recommended follow up at outpatient AF clinic.   -Diltiazem  180mg  qd -Metoprolol 75mg  bid qd -Sotalol 120mg  bid -xarelto 20mg  qd -continue CPAP use  Atypical chest pain Resolved  -continueHydroxyzine 25mg  tid prn for anxiety -Nexium 40mg  qd -continueGI cocktail 40mL TID prn  Chronic diastolic chf The patient put out758mL over the past 24 hrs, however the patient's weight has gone up form 224 to 227lbs.   TTE (3/24) showed moderately calcified mitral annulus that can contribute to patients afib. It also showeed lvef 60-65%, mild lvh, and no regional wall motion abnormalities.  -Continue poLasix40mg  qd  Hyponatremialikely secondary to SIADH The patient's sodiumis 128 today.  -Fluid restriction to 1272mLwill discharge with 1515mL fluid restriction  Essential Hypertension The patient's blood pressure this morning is100-159/66-98  -held home hctz due to hyponatremia concern -lisinopril 20mg  qd  Anxiety Major Depressive Disorder  -Continue home suvorexant 1 tablet qhs -Hydroxyzine 25mg  tid prn for anxiety  OSA -cpap nightly  Dispo: Anticipated discharge in approximately today.   Lars Mage, MD 11/10/2017, 8:00 AM Pager: [phone]

## 2017-11-10 NOTE — Progress Notes (Signed)
Pt attempted to use CPAP overnight but stated that it was too uncomfortable and that they, "Just couldn't do it." Pt took CPAP off and is now refusing it.

## 2017-11-10 NOTE — Evaluation (Addendum)
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 During hospitalization she developed paroxysmal afib with rvr.  Clinical Impression  Pt admitted with above diagnosis. Pt currently without significant functional limitations and is functioning at baseline per pt.  She refuses HH therapies stating she does not need any help at home.  Sats >90% at rest and with activity.  Will not follow as pt does not have skilled PT needs.Sign off.    Follow Up Recommendations No PT follow up (recommended HHPT safety eval but pt pleasantly declined);Supervision - Intermittent    Equipment Recommendations  None recommended by PT    Recommendations for Other Services       Precautions / Restrictions Precautions Precautions: Fall Restrictions Weight Bearing Restrictions: No      Mobility  Bed Mobility Overal bed mobility: Independent                Transfers Overall transfer level: Independent                  Ambulation/Gait Ambulation/Gait assistance: Supervision Ambulation Distance (Feet): 120 Feet Assistive device: None Gait Pattern/deviations: Step-through pattern;Decreased stride length;Decreased weight shift to left   Gait velocity interpretation: Below normal speed for age/gender General Gait Details: Pt was able to ambulate in hallway with good stability without challenges to balance.  Pt states that she is ambulating at baseline.    Stairs            Wheelchair Mobility    Modified Rankin (Stroke Patients Only)       Balance Overall balance assessment: Needs assistance;History of Falls Sitting-balance support:  No upper extremity supported;Feet supported Sitting balance-Leahy Scale: Good     Standing balance support: No upper extremity supported;During functional activity Standing balance-Leahy Scale: Fair Standing balance comment: can stand statically without support.                              Pertinent Vitals/Pain Pain Assessment: No/denies pain  VSS  Home Living Family/patient expects to be discharged to:: Private residence Living Arrangements: Alone Available Help at Discharge: Family;Available PRN/intermittently(son lives an hour away) Type of Home: House Home Access: Stairs to enter Entrance Stairs-Rails: Right Entrance Stairs-Number of Steps: 3-4 Home Layout: One level Home Equipment: Shower seat - built in;Wheelchair - manual;Cane - single point Additional Comments: housekeeper comes each week for 4 hours    Prior Function Level of Independence: Independent               Hand Dominance        Extremity/Trunk Assessment   Upper Extremity Assessment Upper Extremity Assessment: Defer to OT evaluation    Lower Extremity Assessment Lower Extremity Assessment: Generalized weakness    Cervical / Trunk Assessment Cervical / Trunk Assessment: Normal  Communication   Communication: No difficulties  Cognition Arousal/Alertness: Awake/alert Behavior During Therapy: WFL for tasks assessed/performed Overall Cognitive Status: Within Functional Limits for tasks assessed  General Comments      Exercises     Assessment/Plan    PT Assessment Patent does not need any further PT services  PT Problem List         PT Treatment Interventions      PT Goals (Current goals can be found in the Care Plan section)  Acute Rehab PT Goals Patient Stated Goal: to go home PT Goal Formulation: All assessment and education complete, DC therapy    Frequency     Barriers to discharge         Co-evaluation               AM-PAC PT "6 Clicks" Daily Activity  Outcome Measure Difficulty turning over in bed (including adjusting bedclothes, sheets and blankets)?: None Difficulty moving from lying on back to sitting on the side of the bed? : None Difficulty sitting down on and standing up from a chair with arms (e.g., wheelchair, bedside commode, etc,.)?: None Help needed moving to and from a bed to chair (including a wheelchair)?: None Help needed walking in hospital room?: None Help needed climbing 3-5 steps with a railing? : None 6 Click Score: 24    End of Session Equipment Utilized During Treatment: Gait belt Activity Tolerance: Patient limited by fatigue Patient left: in chair;with call bell/phone within reach Nurse Communication: Mobility status PT Visit Diagnosis: Unsteadiness on feet (R26.81);Muscle weakness (generalized) (M62.81)    Time: 9678-9381 PT Time Calculation (min) (ACUTE ONLY): 16 min   Charges:   PT Evaluation $PT Eval Low Complexity: 1 Low     PT G Codes:        Angeleah Labrake,PT Acute Rehabilitation [phone] (pager)   Denice Paradise 11/10/2017, 11:08 AM

## 2017-11-10 NOTE — Progress Notes (Addendum)
Came by to check on patient. Maintaining SR with addition of diltiazem Feels well this morning Ok to discharge from EP standpoint after EKG done this morning to check QTc in sinus Would consolidate Diltiazem at discharge to Cardizem CD 180mg  daily  Will arrange early outpatient follow up with AF clinic  Chanetta Marshall, NP 11/10/2017 10:15 AM

## 2017-11-12 ENCOUNTER — Emergency Department (HOSPITAL_COMMUNITY): Payer: PPO

## 2017-11-12 ENCOUNTER — Inpatient Hospital Stay (HOSPITAL_COMMUNITY)
Admission: EM | Admit: 2017-11-12 | Discharge: 2017-11-20 | DRG: 315 | Disposition: A | Discharge status: Skilled Nursing Facility | Payer: PPO | Attending: Student in an Organized Health Care Education/Training Program | Admitting: Student in an Organized Health Care Education/Training Program

## 2017-11-12 ENCOUNTER — Encounter (HOSPITAL_COMMUNITY): Payer: Self-pay | Admitting: Emergency Medicine

## 2017-11-12 ENCOUNTER — Telehealth: Payer: Self-pay | Admitting: Cardiology

## 2017-11-12 DIAGNOSIS — M6281 Muscle weakness (generalized): Secondary | ICD-10-CM | POA: Diagnosis not present

## 2017-11-12 DIAGNOSIS — I493 Ventricular premature depolarization: Secondary | ICD-10-CM | POA: Diagnosis not present

## 2017-11-12 DIAGNOSIS — E871 Hypo-osmolality and hyponatremia: Secondary | ICD-10-CM | POA: Diagnosis not present

## 2017-11-12 DIAGNOSIS — I314 Cardiac tamponade: Secondary | ICD-10-CM | POA: Diagnosis not present

## 2017-11-12 DIAGNOSIS — Z886 Allergy status to analgesic agent status: Secondary | ICD-10-CM

## 2017-11-12 DIAGNOSIS — Z888 Allergy status to other drugs, medicaments and biological substances status: Secondary | ICD-10-CM

## 2017-11-12 DIAGNOSIS — G4733 Obstructive sleep apnea (adult) (pediatric): Secondary | ICD-10-CM | POA: Diagnosis present

## 2017-11-12 DIAGNOSIS — G4730 Sleep apnea, unspecified: Secondary | ICD-10-CM | POA: Diagnosis not present

## 2017-11-12 DIAGNOSIS — Z6841 Body Mass Index (BMI) 40.0 and over, adult: Secondary | ICD-10-CM

## 2017-11-12 DIAGNOSIS — M109 Gout, unspecified: Secondary | ICD-10-CM | POA: Diagnosis not present

## 2017-11-12 DIAGNOSIS — Z880 Allergy status to penicillin: Secondary | ICD-10-CM

## 2017-11-12 DIAGNOSIS — I5032 Chronic diastolic (congestive) heart failure: Secondary | ICD-10-CM | POA: Diagnosis present

## 2017-11-12 DIAGNOSIS — Z808 Family history of malignant neoplasm of other organs or systems: Secondary | ICD-10-CM

## 2017-11-12 DIAGNOSIS — E785 Hyperlipidemia, unspecified: Secondary | ICD-10-CM | POA: Diagnosis not present

## 2017-11-12 DIAGNOSIS — Z882 Allergy status to sulfonamides status: Secondary | ICD-10-CM

## 2017-11-12 DIAGNOSIS — R2689 Other abnormalities of gait and mobility: Secondary | ICD-10-CM | POA: Diagnosis not present

## 2017-11-12 DIAGNOSIS — Z7951 Long term (current) use of inhaled steroids: Secondary | ICD-10-CM

## 2017-11-12 DIAGNOSIS — F330 Major depressive disorder, recurrent, mild: Secondary | ICD-10-CM | POA: Diagnosis not present

## 2017-11-12 DIAGNOSIS — J9811 Atelectasis: Secondary | ICD-10-CM | POA: Diagnosis not present

## 2017-11-12 DIAGNOSIS — Z9049 Acquired absence of other specified parts of digestive tract: Secondary | ICD-10-CM | POA: Diagnosis not present

## 2017-11-12 DIAGNOSIS — I480 Paroxysmal atrial fibrillation: Secondary | ICD-10-CM | POA: Diagnosis present

## 2017-11-12 DIAGNOSIS — I309 Acute pericarditis, unspecified: Secondary | ICD-10-CM | POA: Diagnosis not present

## 2017-11-12 DIAGNOSIS — Z86000 Personal history of in-situ neoplasm of breast: Secondary | ICD-10-CM | POA: Diagnosis not present

## 2017-11-12 DIAGNOSIS — I110 Hypertensive heart disease with heart failure: Secondary | ICD-10-CM | POA: Diagnosis present

## 2017-11-12 DIAGNOSIS — I5030 Unspecified diastolic (congestive) heart failure: Secondary | ICD-10-CM | POA: Diagnosis not present

## 2017-11-12 DIAGNOSIS — I10 Essential (primary) hypertension: Secondary | ICD-10-CM | POA: Diagnosis not present

## 2017-11-12 DIAGNOSIS — Z96653 Presence of artificial knee joint, bilateral: Secondary | ICD-10-CM | POA: Diagnosis present

## 2017-11-12 DIAGNOSIS — F419 Anxiety disorder, unspecified: Secondary | ICD-10-CM | POA: Diagnosis present

## 2017-11-12 DIAGNOSIS — I313 Pericardial effusion (noninflammatory): Principal | ICD-10-CM | POA: Diagnosis present

## 2017-11-12 DIAGNOSIS — R0602 Shortness of breath: Secondary | ICD-10-CM | POA: Diagnosis present

## 2017-11-12 DIAGNOSIS — Z803 Family history of malignant neoplasm of breast: Secondary | ICD-10-CM

## 2017-11-12 DIAGNOSIS — Z923 Personal history of irradiation: Secondary | ICD-10-CM | POA: Diagnosis not present

## 2017-11-12 DIAGNOSIS — F411 Generalized anxiety disorder: Secondary | ICD-10-CM | POA: Diagnosis not present

## 2017-11-12 DIAGNOSIS — R079 Chest pain, unspecified: Secondary | ICD-10-CM | POA: Diagnosis not present

## 2017-11-12 DIAGNOSIS — Z90710 Acquired absence of both cervix and uterus: Secondary | ICD-10-CM | POA: Diagnosis not present

## 2017-11-12 DIAGNOSIS — K219 Gastro-esophageal reflux disease without esophagitis: Secondary | ICD-10-CM | POA: Diagnosis not present

## 2017-11-12 DIAGNOSIS — I4891 Unspecified atrial fibrillation: Secondary | ICD-10-CM

## 2017-11-12 DIAGNOSIS — E222 Syndrome of inappropriate secretion of antidiuretic hormone: Secondary | ICD-10-CM | POA: Diagnosis not present

## 2017-11-12 DIAGNOSIS — I3139 Other pericardial effusion (noninflammatory): Secondary | ICD-10-CM

## 2017-11-12 DIAGNOSIS — Z79899 Other long term (current) drug therapy: Secondary | ICD-10-CM

## 2017-11-12 DIAGNOSIS — I959 Hypotension, unspecified: Secondary | ICD-10-CM | POA: Diagnosis not present

## 2017-11-12 DIAGNOSIS — F329 Major depressive disorder, single episode, unspecified: Secondary | ICD-10-CM | POA: Diagnosis present

## 2017-11-12 DIAGNOSIS — Z853 Personal history of malignant neoplasm of breast: Secondary | ICD-10-CM | POA: Diagnosis not present

## 2017-11-12 DIAGNOSIS — R0600 Dyspnea, unspecified: Secondary | ICD-10-CM

## 2017-11-12 DIAGNOSIS — E876 Hypokalemia: Secondary | ICD-10-CM | POA: Diagnosis present

## 2017-11-12 DIAGNOSIS — I482 Chronic atrial fibrillation: Secondary | ICD-10-CM | POA: Diagnosis not present

## 2017-11-12 DIAGNOSIS — J811 Chronic pulmonary edema: Secondary | ICD-10-CM | POA: Diagnosis not present

## 2017-11-12 DIAGNOSIS — F339 Major depressive disorder, recurrent, unspecified: Secondary | ICD-10-CM | POA: Diagnosis not present

## 2017-11-12 DIAGNOSIS — M797 Fibromyalgia: Secondary | ICD-10-CM | POA: Diagnosis not present

## 2017-11-12 DIAGNOSIS — Z9989 Dependence on other enabling machines and devices: Secondary | ICD-10-CM | POA: Diagnosis not present

## 2017-11-12 DIAGNOSIS — G4700 Insomnia, unspecified: Secondary | ICD-10-CM | POA: Diagnosis present

## 2017-11-12 DIAGNOSIS — Z885 Allergy status to narcotic agent status: Secondary | ICD-10-CM

## 2017-11-12 DIAGNOSIS — Z452 Encounter for adjustment and management of vascular access device: Secondary | ICD-10-CM | POA: Diagnosis not present

## 2017-11-12 DIAGNOSIS — R5383 Other fatigue: Secondary | ICD-10-CM | POA: Diagnosis not present

## 2017-11-12 DIAGNOSIS — R0601 Orthopnea: Secondary | ICD-10-CM | POA: Diagnosis not present

## 2017-11-12 DIAGNOSIS — Z9011 Acquired absence of right breast and nipple: Secondary | ICD-10-CM

## 2017-11-12 DIAGNOSIS — Z7901 Long term (current) use of anticoagulants: Secondary | ICD-10-CM

## 2017-11-12 HISTORY — DX: Dyspnea, unspecified: R06.00

## 2017-11-12 HISTORY — DX: Unspecified atrial fibrillation: I48.91

## 2017-11-12 LAB — I-STAT TROPONIN, ED
TROPONIN I, POC: 0 ng/mL (ref 0.00–0.08)
TROPONIN I, POC: 0 ng/mL (ref 0.00–0.08)

## 2017-11-12 LAB — CBC WITH DIFFERENTIAL/PLATELET
BASOS PCT: 0 %
Basophils Absolute: 0 10*3/uL (ref 0.0–0.1)
EOS ABS: 0 10*3/uL (ref 0.0–0.7)
Eosinophils Relative: 0 %
HEMATOCRIT: 32.7 % — AB (ref 36.0–46.0)
Hemoglobin: 10.4 g/dL — ABNORMAL LOW (ref 12.0–15.0)
Lymphocytes Relative: 8 %
Lymphs Abs: 0.9 10*3/uL (ref 0.7–4.0)
MCH: 26.3 pg (ref 26.0–34.0)
MCHC: 31.8 g/dL (ref 30.0–36.0)
MCV: 82.6 fL (ref 78.0–100.0)
Monocytes Absolute: 1.2 10*3/uL — ABNORMAL HIGH (ref 0.1–1.0)
Monocytes Relative: 11 %
NEUTROS ABS: 8.4 10*3/uL — AB (ref 1.7–7.7)
NEUTROS PCT: 81 %
PLATELETS: 403 10*3/uL — AB (ref 150–400)
RBC: 3.96 MIL/uL (ref 3.87–5.11)
RDW: 13.7 % (ref 11.5–15.5)
WBC: 10.4 10*3/uL (ref 4.0–10.5)

## 2017-11-12 LAB — COMPREHENSIVE METABOLIC PANEL
ALT: 18 U/L (ref 14–54)
AST: 22 U/L (ref 15–41)
Albumin: 3 g/dL — ABNORMAL LOW (ref 3.5–5.0)
Alkaline Phosphatase: 81 U/L (ref 38–126)
Anion gap: 11 (ref 5–15)
BILIRUBIN TOTAL: 0.8 mg/dL (ref 0.3–1.2)
BUN: 29 mg/dL — ABNORMAL HIGH (ref 6–20)
CHLORIDE: 92 mmol/L — AB (ref 101–111)
CO2: 24 mmol/L (ref 22–32)
CREATININE: 1.19 mg/dL — AB (ref 0.44–1.00)
Calcium: 8.9 mg/dL (ref 8.9–10.3)
GFR calc non Af Amer: 45 mL/min — ABNORMAL LOW (ref >60)
GFR, EST AFRICAN AMERICAN: 53 mL/min — AB (ref >60)
Glucose, Bld: 136 mg/dL — ABNORMAL HIGH (ref 65–99)
POTASSIUM: 3.7 mmol/L (ref 3.5–5.1)
Sodium: 127 mmol/L — ABNORMAL LOW (ref 135–145)
TOTAL PROTEIN: 6.4 g/dL — AB (ref 6.5–8.1)

## 2017-11-12 LAB — BRAIN NATRIURETIC PEPTIDE: B NATRIURETIC PEPTIDE 5: 395.7 pg/mL — AB (ref 0.0–100.0)

## 2017-11-12 LAB — D-DIMER, QUANTITATIVE (NOT AT ARMC): D DIMER QUANT: 3 ug{FEU}/mL — AB (ref 0.00–0.50)

## 2017-11-12 MED ORDER — RAMELTEON 8 MG PO TABS
8.0000 mg | ORAL_TABLET | Freq: Every day | ORAL | Status: DC
  Administered 2017-11-13 – 2017-11-19 (×8): 8 mg via ORAL
  Filled 2017-11-12 (×9): qty 1

## 2017-11-12 MED ORDER — DULOXETINE HCL 60 MG PO CPEP
120.0000 mg | ORAL_CAPSULE | Freq: Every day | ORAL | Status: DC
  Administered 2017-11-13 – 2017-11-20 (×8): 120 mg via ORAL
  Filled 2017-11-12 (×8): qty 2

## 2017-11-12 MED ORDER — DILTIAZEM HCL-DEXTROSE 100-5 MG/100ML-% IV SOLN (PREMIX)
5.0000 mg/h | INTRAVENOUS | Status: DC
  Administered 2017-11-12: 5 mg/h via INTRAVENOUS
  Filled 2017-11-12: qty 100

## 2017-11-12 MED ORDER — ENOXAPARIN SODIUM 40 MG/0.4ML ~~LOC~~ SOLN
40.0000 mg | SUBCUTANEOUS | Status: DC
  Filled 2017-11-12: qty 0.4

## 2017-11-12 MED ORDER — DILTIAZEM HCL 60 MG PO TABS
60.0000 mg | ORAL_TABLET | Freq: Three times a day (TID) | ORAL | Status: DC
  Administered 2017-11-12 – 2017-11-15 (×7): 60 mg via ORAL
  Filled 2017-11-12 (×8): qty 1

## 2017-11-12 MED ORDER — DILTIAZEM LOAD VIA INFUSION
10.0000 mg | Freq: Once | INTRAVENOUS | Status: AC
  Administered 2017-11-12: 10 mg via INTRAVENOUS
  Filled 2017-11-12: qty 10

## 2017-11-12 MED ORDER — METOPROLOL TARTRATE 50 MG PO TABS
75.0000 mg | ORAL_TABLET | Freq: Two times a day (BID) | ORAL | Status: DC
  Administered 2017-11-12 – 2017-11-20 (×15): 75 mg via ORAL
  Filled 2017-11-12 (×16): qty 1

## 2017-11-12 MED ORDER — RIVAROXABAN 20 MG PO TABS
20.0000 mg | ORAL_TABLET | Freq: Every day | ORAL | Status: DC
  Administered 2017-11-12: 20 mg via ORAL
  Filled 2017-11-12: qty 1

## 2017-11-12 MED ORDER — SERTRALINE HCL 50 MG PO TABS
50.0000 mg | ORAL_TABLET | Freq: Every day | ORAL | Status: DC
  Administered 2017-11-13 – 2017-11-16 (×4): 50 mg via ORAL
  Filled 2017-11-12 (×4): qty 1

## 2017-11-12 MED ORDER — DILTIAZEM HCL 60 MG PO TABS
60.0000 mg | ORAL_TABLET | Freq: Once | ORAL | Status: AC
  Administered 2017-11-12: 60 mg via ORAL
  Filled 2017-11-12: qty 1

## 2017-11-12 MED ORDER — SOTALOL HCL 80 MG PO TABS
120.0000 mg | ORAL_TABLET | Freq: Two times a day (BID) | ORAL | Status: DC
  Administered 2017-11-12 – 2017-11-20 (×15): 120 mg via ORAL
  Filled 2017-11-12: qty 1
  Filled 2017-11-12 (×2): qty 2
  Filled 2017-11-12 (×2): qty 1
  Filled 2017-11-12 (×4): qty 2
  Filled 2017-11-12: qty 1
  Filled 2017-11-12: qty 2
  Filled 2017-11-12 (×2): qty 1
  Filled 2017-11-12 (×2): qty 2
  Filled 2017-11-12: qty 1
  Filled 2017-11-12: qty 2

## 2017-11-12 MED ORDER — FUROSEMIDE 10 MG/ML IJ SOLN
40.0000 mg | Freq: Once | INTRAMUSCULAR | Status: AC
  Administered 2017-11-12: 40 mg via INTRAVENOUS
  Filled 2017-11-12: qty 4

## 2017-11-12 NOTE — ED Triage Notes (Signed)
Pt arrives via EMS from home with complaints of SOB x2 days. Pt was discharged Friday after being treated for new onset A-fib, on xarelto. Per EMS, pt was A-fib with HR in 150s but converted to NSR while transporting to the hospital. Pt denies CP.

## 2017-11-12 NOTE — ED Notes (Signed)
Pt ambulated to hallway. O2 sats stayed 94% on RA, however, pt had to stop due to weakness and felt SOB. Pt back in bed resting comfortably on [place] 2L.

## 2017-11-12 NOTE — ED Notes (Signed)
Placed PW. Patient was comfortable with it.

## 2017-11-12 NOTE — H&P (Signed)
Date: 11/12/2017               Patient Name:  Selena Bush MRN: [id number]  DOB: 24-Apr-1946 Age / Sex: 72 y.o., female   PCP: Wardell Honour, MD         Medical Service: Internal Medicine Teaching Service         Attending Physician: Dr. Gareth Morgan, MD    First Contact: Dr. Lars Mage Pager: [phone]  Second Contact: Dr. Ledell Noss Pager: [phone]       After Hours (After 5p/  First Contact Pager: [phone]  weekends / holidays): Second Contact Pager: [phone]   Chief Complaint: Shortness of breath and fatigue  History of Present Illness: Selena Bush is a 72 y.o f with chronic HFpEF (ef 60-65%, mild lvh, mildly dilated la, mild mr/tr), essential htn, papillary DCIS DR/PR positive s/p partial right mastectomy and radiation, HLD, OSA on cpap who presents with shortness of breath and fatigue. The patient was recently admitted from 11/04/17-11/10/17 for atypical chest pain and atrial fibrillation with rvr. She was discharged with instructions to take xarelto, diltiazem 180mg  qd, metoprolol 75mg  bid, and sotalol 120mg  bid.  The patient states that after being discharged on Friday she was doing well and felt well. On Saturday she started noticing that she was breathing really hard, was exhausted, and states that she felt that "she can hardly move". Sunday morning she felt that her breathing continued to worsen and she had dizziness when walking. Her daughter called the on call cardiologist and was told to call 911.   The morning prior to admission the patient took her diltiazem, furosemide, lisinopril, evista, nexium, and morning dose of sotalol and metoprolol.   The patient was found to be in afib with rvr and she subsequently converted en route to hospital to normal sinus rhythm, but subsequently converted back to afib with rvr.    ED Course: Normotensive, afebrile, tachycardic (120-130s), saturating well on room air, tachypnea (20s) IV diltiazem drip started  D-dimer order  placed due to tachypnea, tachycardia, and history of breast cancer with recent immobilization while in hospital  Meds:  Current Meds  Medication Sig  . colchicine 0.6 MG tablet Take 1 tablet (0.6 mg total) by mouth 2 (two) times daily. (Patient taking differently: Take 0.6 mg by mouth 2 (two) times daily as needed (for gout). )  . diltiazem (CARDIZEM CD) 180 MG 24 hr capsule Take 1 capsule (180 mg total) by mouth daily.  . DULoxetine (CYMBALTA) 60 MG capsule Take 120 mg by mouth daily.  [name] esomeprazole (NEXIUM) 40 MG capsule Take 1 capsule (40 mg total) by mouth every morning.  . fluticasone (FLONASE) 50 MCG/ACT nasal spray Place 2 sprays into both nostrils daily as needed for rhinitis or allergies.  . furosemide (LASIX) 40 MG tablet Take 1 tablet (40 mg total) by mouth daily.  [name] lisinopril-hydrochlorothiazide (PRINZIDE,ZESTORETIC) 20-12.5 MG tablet Take 1 tablet by mouth daily.  [name] LORazepam (ATIVAN) 0.5 MG tablet Take 0.5 mg by mouth 2 (two) times daily as needed for anxiety.  . Metoprolol Tartrate 75 MG TABS Take 75 mg by mouth 2 (two) times daily.  . raloxifene (EVISTA) 60 MG tablet TAKE 1 TABLET BY MOUTH EVERY DAY  . rivaroxaban (XARELTO) 20 MG TABS tablet Take 1 tablet (20 mg total) by mouth daily with supper.  . sertraline (ZOLOFT) 50 MG tablet Take 1 tablet (50 mg total) by mouth daily.  . sotalol (BETAPACE) 120 MG  tablet Take 1 tablet (120 mg total) by mouth every 12 (twelve) hours.  . Suvorexant (BELSOMRA) 20 MG TABS Take 1 tablet by mouth at bedtime.     Allergies: Allergies as of 11/12/2017 - Review Complete 11/12/2017  Allergen Reaction Noted  . Penicillins Anaphylaxis 11/02/2012  . Sulfa antibiotics Anaphylaxis 11/02/2012  . Asa [aspirin]  02/06/2016  . Codeine  11/02/2012  . Statins Other (See Comments) 04/05/2013   Past Medical History:  Diagnosis Date  . Anxiety   . Chronic diastolic CHF (congestive heart failure) ([place])    a. echo 09/2014: EF 33-29%, diastolic  dysfunction, mild LVH, nl RV size & systolic function, mildly dilated LA (4.3 cm), mild MR/TR, mildly elevated PASP 36.7 mm Hg  . DDD (degenerative disc disease), cervical   . DDD (degenerative disc disease), lumbar   . Depression   . Diffuse cystic mastopathy 2014  . GERD (gastroesophageal reflux disease)   . Headache    rare  . HLD (hyperlipidemia)    a. statin intolerant 2/2 myalgias  . Hypercholesterolemia   . Hypertension   . Malignant neoplasm of upper-outer quadrant of female breast ([place]) 10/2012   Papillary DCIS, sentinel node negative. DR/PR positive. PARTIAL RIGHT MASTECTOMY FOR BREAST CANCER--HAD RADIATION - NO CHEMO --DR. [place] ONCOLOGIST  . Obesity   . OSA on CPAP   . Osteoarthritis of both knees    a. s/p right TKA 04/2013 & left TKA 09/2014  . Otitis externa   . Personal history of radiation therapy 2015   RIGHT breast-mammosite per pt  . Sleep difficulties    LUNESTA HAS HELPED  . Vaginal cyst     Family History:  Family History  Problem Relation Age of Onset  . Colon cancer Mother   . Cancer Mother 13       colon cancer  . Melanoma Father   . Cancer Father 47       melanoma  . Colon cancer Maternal Grandfather   . Breast cancer Maternal Grandfather    Social History:  Social History   Socioeconomic History  . Marital status: Widowed    Spouse name: Selena Bush  . Number of children: 2  . Years of education: College  . Highest education level: Bachelor's degree (e.g., BA, AB, BS)  Occupational History  . Occupation: Retired  Scientific laboratory technician  . Financial resource strain: Not hard at all  . Food insecurity:    Worry: Never true    Inability: Never true  . Transportation needs:    Medical: No    Non-medical: No  Tobacco Use  . Smoking status: Never Smoker  . Smokeless tobacco: Never Used  . Tobacco comment: social smoker as a teen  Substance and Sexual Activity  . Alcohol use: No  . Drug use: No  . Sexual activity: Not Currently     Birth control/protection: Post-menopausal, Surgical  Lifestyle  . Physical activity:    Days per week: 7 days    Minutes per session: 20 min  . Stress: Only a little  Relationships  . Social connections:    Talks on phone: More than three times a week    Gets together: More than three times a week    Attends religious service: More than 4 times per year    Active member of club or organization: Yes    Attends meetings of clubs or organizations: More than 4 times per year    Relationship status: Widowed  Other Topics Concern  .  Not on file  Social History Narrative   Marital status: widowed since 09/16/2015      Children: 2 children (58, 75); 5 grandchild (4 in [place]; 1 in [place]).      Lives: alone in townhome; 1 dog, 2 cats      Employment: psychiatric Education officer, museum; retired in 2015      Tobacco: teenager only      Alcohol:  None      Drugs: none      Exercise: walking in 2019; more active in 2019.  Walking daily small amounts.        ADLs: drives; independent with ADLs; no assistant devices      Advanced Directives: none; FULL CODE; no prolonged measures.   Does not need them.  Daughter/Heather Vista Deck is HCPOA.    Review of Systems: A complete ROS was negative except as per HPI.   Physical Exam: Blood pressure 124/78, pulse 70, temperature 98.3 F (36.8 C), temperature source Oral, resp. rate (!) 21, height 5\' 2"  (1.575 m), weight 227 lb 1.2 oz (103 kg), SpO2 98 %.   Physical Exam  Constitutional: She appears well-developed and well-nourished. No distress.  HENT:  Head: Normocephalic and atraumatic.  Eyes: Conjunctivae are normal.  Neck: No JVD present.  Cardiovascular: Normal rate, regular rhythm, normal heart sounds and intact distal pulses.  Respiratory: She is in respiratory distress (tachypnic). She has no wheezes. She has no rales (bibasilar).  GI: Soft. Bowel sounds are normal. She exhibits no distension. There is no tenderness.  Musculoskeletal: She exhibits no  edema (1+ pitting).  Neurological: She is alert.  Skin: She is not diaphoretic. No erythema.  Psychiatric: She has a normal mood and affect. Her behavior is normal. Judgment and thought content normal.   CMP Latest Ref Rng & Units 11/12/2017 11/10/2017 11/09/2017  Glucose 65 - 99 mg/dL 136(H) 102(H) 106(H)  BUN 6 - 20 mg/dL 29(H) 22(H) 20  Creatinine 0.44 - 1.00 mg/dL 1.19(H) 1.20(H) 1.16(H)  Sodium 135 - 145 mmol/L 127(L) 128(L) 129(L)  Potassium 3.5 - 5.1 mmol/L 3.7 3.7 3.6  Chloride 101 - 111 mmol/L 92(L) 91(L) 93(L)  CO2 22 - 32 mmol/L 24 24 26   Calcium 8.9 - 10.3 mg/dL 8.9 9.1 8.9  Total Protein 6.5 - 8.1 g/dL 6.4(L) - -  Total Bilirubin 0.3 - 1.2 mg/dL 0.8 - -  Alkaline Phos 38 - 126 U/L 81 - -  AST 15 - 41 U/L 22 - -  ALT 14 - 54 U/L 18 - -   CBC Latest Ref Rng & Units 11/12/2017 11/10/2017 11/07/2017  WBC 4.0 - 10.5 K/uL 10.4 10.0 9.6  Hemoglobin 12.0 - 15.0 g/dL 10.4(L) 11.0(L) 11.3(L)  Hematocrit 36.0 - 46.0 % 32.7(L) 34.3(L) 33.6(L)  Platelets 150 - 400 K/uL 403(H) 333 319   BNP=395  EKG: personally reviewed my interpretation is normal sinus rhythm, normal rate, no st or t wave changes, qt prolongation  CXR: personally reviewed my interpretation is mild pulmonary vascular congestion  Assessment & Plan by Problem:  Ms. 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  Paroxysmal atrial fibrillation The patient was recently discharged on 11/10/17 for an afib hospitalization. The patient returned to [facility] today with afib in rvr. She is compliant with all her medication and  cpap usage. The patient has mild qt prolongation noted on ekg.   -Telemetry -Diltiazem 60mg  tid -continue sotalol 120mg  bid -continue metoprolol 75mg  bid   Dyspnea The patient's sudden  onset of dyspnea with accompanied tachycardia, recent immobilization during hospitalization, and hx of breast cancer places concern for possible pe and therefore d-dimer ordered. Patient does not have any fever or leukocytosis and chest xray does not show any consolidation to suggest pneumonia. Troponin has been negative and no st or t wave changes to suggest acs. The patient's most likely cause of dyspnea is exacerbation of atrial fibrillation.   -D-dimer pending  HFpEF The patient's last echo was done on 11/05/17 which showed lvef of 60-65%, mild lvh, no regional wall motion abnormalities, cvp=3, mild left atrium dilation, mitral valve calcificaiton of annulus. The patient's weight on admission was 227lbs which is same as she was on day of discharge. On exam, the patient appeared euvolemic without any jvd, rales on exam, or significant lower extremity pitting edema. The patient's bnp is also not extremely elevated (bnp=395).  -strict I/O -daily weights -fluid restriction to 1510ml -IV lasix 40mg  once  Hyponatremia secondary to SIADH The patient has a sodium level of 127 on admission. During recent hospitalization the patient also had low sodium levels that was thought to be due to SIADH. The patient is on ssri's and snri's which can worsen siadh.   -Fluid restriction to 1528ml  Essential Hypertension The patient's blood pressure has ranged 103-124/78-92 since admission.   -Patient already took home dose of lisinopril this morning  GERD -continue esomeprazole 40mg  qam  Anxiety Major Depressive Disorder  -Continue home suvorexant 1tablet qhs -continue zoloft 50mg  qd  OSA -continue cpap qhs   Dispo: Admit patient to Observation with expected length of stay less than 2 midnights.  SignedLars Mage, MD 11/12/2017, 4:27 PM  Pager: [phone]

## 2017-11-12 NOTE — ED Provider Notes (Signed)
[facility] EMERGENCY DEPARTMENT Provider Note   CSN: [id number] Arrival date & time: 11/12/17  1120     History   Chief Complaint Chief Complaint  Patient presents with  . Atrial Fibrillation    HPI Selena Bush is a 72 y.o. female.  HPI   72 year old female with a history of congestive heart failure, hypertension, hyperlipidemia, recent diagnosis of atrial fibrillation with recent admission and discharge 2 days ago on Xarelto, metoprolol and diltiazem, presents with concern for fatigue and shortness of breath.  Patient with A. fib with RVR on initial arrival of EMS, noted to have possible conversion noted on EKG in the emergency department, followed by again A. fib with RVR and a rate up in the 140s.  Patient reports that since yesterday she is been feeling fatigued and short of breath.  Reports shortness of breath with minimal exertion.  Denies any chest pain, nausea, or diaphoresis.  Denies any other acute illness, no fevers.  Reports that she is been taking her medications as prescribed. Reports she cannot feel when she goes in and out of afib.  Past Medical History:  Diagnosis Date  . Anxiety   . Chronic diastolic CHF (congestive heart failure) ([place])    a. echo 09/2014: EF 48-54%, diastolic dysfunction, mild LVH, nl RV size & systolic function, mildly dilated LA (4.3 cm), mild MR/TR, mildly elevated PASP 36.7 mm Hg  . DDD (degenerative disc disease), cervical   . DDD (degenerative disc disease), lumbar   . Depression   . Diffuse cystic mastopathy 2014  . GERD (gastroesophageal reflux disease)   . Headache    rare  . HLD (hyperlipidemia)    a. statin intolerant 2/2 myalgias  . Hypercholesterolemia   . Hypertension   . Malignant neoplasm of upper-outer quadrant of female breast ([place]) 10/2012   Papillary DCIS, sentinel node negative. DR/PR positive. PARTIAL RIGHT MASTECTOMY FOR BREAST CANCER--HAD RADIATION - NO CHEMO --DR. [place] ONCOLOGIST    . Obesity   . OSA on CPAP   . Osteoarthritis of both knees    a. s/p right TKA 04/2013 & left TKA 09/2014  . Otitis externa   . Personal history of radiation therapy 2015   RIGHT breast-mammosite per pt  . Sleep difficulties    LUNESTA HAS HELPED  . Vaginal cyst     Patient Active Problem List   Diagnosis Date Noted  . Atrial fibrillation with RVR ([place]) 11/05/2017  . Atypical chest pain 11/04/2017  . Anxiety 03/21/2016  . Insomnia 07/17/2015  . Hearing loss 03/09/2015  . Hypokalemia 01/21/2015  . MI (mitral incompetence) 12/10/2014  . Depression 12/09/2014  . Sleep apnea 12/09/2014  . GERD (gastroesophageal reflux disease) 12/09/2014  . Hyperglycemia 12/09/2014  . Chronic diastolic CHF (congestive heart failure) ([place])   . HLD (hyperlipidemia)   . Osteoarthritis of both knees   . S/P left TKA 08/19/2014  . Morbid obesity ([place]) 04/17/2013  . Hyponatremia 04/17/2013  . DCIS (ductal carcinoma in situ) 11/05/2012  . Essential hypertension, benign 11/05/2012    Past Surgical History:  Procedure Laterality Date  . ABDOMINAL HYSTERECTOMY  1992   DUB; fibroids; endometriosis.  One remaining ovary.    [name] BREAST LUMPECTOMY Right 2015   Papillary DCIS, sentinel node negative. DR/PR positive. PARTIAL RIGHT MASTECTOMY FOR BREAST CANCER--HAD RADIATION - NO CHEMO --DR. [place] ONCOLOGIST  . BREAST SURGERY Right March 2014   Wide excision,APB RT 10 mm papillary DCIS, ER/PR positive. Sentinel node negative.  Partial breast radiation.  [name] CATARACT EXTRACTION, BILATERAL  02/13/2016   [place].  . CHOLECYSTECTOMY  1994  . COLONOSCOPY  2015   1 benign polyp-every 5 years/ Dr Candace Cruise  . ERCP  1995  . JOINT REPLACEMENT Right Sept 2014   knee  . TOTAL KNEE ARTHROPLASTY Right 04/16/2013   Procedure: RIGHT TOTAL KNEE ARTHROPLASTY;  Surgeon: Mauri Pole, MD;  Location: [facility];  Service: Orthopedics;  Laterality: Right;  . TOTAL KNEE ARTHROPLASTY Left 09/15/2014   Procedure: LEFT TOTAL KNEE  ARTHROPLASTY;  Surgeon: Mauri Pole, MD;  Location: [facility];  Service: Orthopedics;  Laterality: Left;  . TUBAL LIGATION  1979     OB History    Gravida  2   Para  2   Term      Preterm      AB      Living  2     SAB      TAB      Ectopic      Multiple      Live Births           Obstetric Comments  First pregnancy 22 First menstrual 15         Home Medications    Prior to Admission medications   Medication Sig Start Date End Date Taking? Authorizing Provider  colchicine 0.6 MG tablet Take 1 tablet (0.6 mg total) by mouth 2 (two) times daily. Patient taking differently: Take 0.6 mg by mouth 2 (two) times daily as needed (for gout).  10/11/17  Yes Wardell Honour, MD  diltiazem (CARDIZEM CD) 180 MG 24 hr capsule Take 1 capsule (180 mg total) by mouth daily. 11/11/17 12/11/17 Yes Chundi, Vahini, MD  DULoxetine (CYMBALTA) 60 MG capsule Take 120 mg by mouth daily.   Yes [provider]  esomeprazole (NEXIUM) 40 MG capsule Take 1 capsule (40 mg total) by mouth every morning. 10/11/17  Yes Wardell Honour, MD  fluticasone [facility]) 50 MCG/ACT nasal spray Place 2 sprays into both nostrils daily as needed for rhinitis or allergies. 10/11/17  Yes Wardell Honour, MD  furosemide (LASIX) 40 MG tablet Take 1 tablet (40 mg total) by mouth daily. 11/11/17  Yes Chundi, Vahini, MD  lisinopril-hydrochlorothiazide (PRINZIDE,ZESTORETIC) 20-12.5 MG tablet Take 1 tablet by mouth daily. 10/11/17  Yes Wardell Honour, MD  LORazepam (ATIVAN) 0.5 MG tablet Take 0.5 mg by mouth 2 (two) times daily as needed for anxiety.   Yes [provider]  Metoprolol Tartrate 75 MG TABS Take 75 mg by mouth 2 (two) times daily. 11/10/17 12/10/17 Yes Chundi, Vahini, MD  raloxifene (EVISTA) 60 MG tablet TAKE 1 TABLET BY MOUTH EVERY DAY 04/21/17  Yes Byrnett, Forest Gleason, MD  rivaroxaban (XARELTO) 20 MG TABS tablet Take 1 tablet (20 mg total) by mouth daily with supper. 11/10/17  Yes Chundi, Vahini, MD   sertraline (ZOLOFT) 50 MG tablet Take 1 tablet (50 mg total) by mouth daily. 11/11/17  Yes Chundi, Vahini, MD  sotalol (BETAPACE) 120 MG tablet Take 1 tablet (120 mg total) by mouth every 12 (twelve) hours. 11/10/17 12/10/17 Yes Chundi, Vahini, MD  Suvorexant (BELSOMRA) 20 MG TABS Take 1 tablet by mouth at bedtime. 10/11/17  Yes Wardell Honour, MD  tiZANidine (ZANAFLEX) 4 MG tablet TAKE 1 TABLET BY MOUTH AT SUPPER AND TAKE 1 TABLET BY MOUTH AT BEDTIME Patient not taking: Reported on 11/12/2017 10/11/17   Wardell Honour, MD    Family History Family  History  Problem Relation Age of Onset  . Colon cancer Mother   . Cancer Mother 109       colon cancer  . Melanoma Father   . Cancer Father 88       melanoma  . Colon cancer Maternal Grandfather   . Breast cancer Maternal Grandfather     Social History Social History   Tobacco Use  . Smoking status: Never Smoker  . Smokeless tobacco: Never Used  . Tobacco comment: social smoker as a teen  Substance Use Topics  . Alcohol use: No  . Drug use: No     Allergies   Penicillins; Sulfa antibiotics; Asa [aspirin]; Codeine; and Statins   Review of Systems Review of Systems  Constitutional: Positive for fatigue. Negative for fever.  HENT: Negative for sore throat.   Eyes: Negative for visual disturbance.  Respiratory: Positive for shortness of breath. Negative for cough.   Cardiovascular: Negative for chest pain.  Gastrointestinal: Negative for abdominal pain, nausea and vomiting.  Genitourinary: Negative for difficulty urinating.  Musculoskeletal: Negative for back pain and neck pain.  Skin: Negative for rash.  Neurological: Negative for syncope and headaches.     Physical Exam Updated Vital Signs BP (!) 117/92   Pulse (!) 27   Temp 98.3 F (36.8 C) (Oral)   Resp (!) 26   Ht 5\' 2"  (1.575 m)   Wt 103 kg (227 lb 1.2 oz)   SpO2 95%   BMI 41.53 kg/m   Physical Exam  Constitutional: She is oriented to person, place, and time.  She appears well-developed and well-nourished. No distress.  HENT:  Head: Normocephalic and atraumatic.  Eyes: Conjunctivae and EOM are normal.  Neck: Normal range of motion.  Cardiovascular: Normal heart sounds and intact distal pulses. An irregularly irregular rhythm present. Tachycardia present. Exam reveals no gallop and no friction rub.  No murmur heard. Pulmonary/Chest: Effort normal and breath sounds normal. No respiratory distress. She has no wheezes. She has no rales.  Abdominal: Soft. She exhibits no distension. There is no tenderness. There is no guarding.  Musculoskeletal: She exhibits no edema or tenderness.  Neurological: She is alert and oriented to person, place, and time.  Skin: Skin is warm and dry. No rash noted. She is not diaphoretic. No erythema.  Nursing note and vitals reviewed.    ED Treatments / Results  Labs (all labs ordered are listed, but only abnormal results are displayed) Labs Reviewed  CBC WITH DIFFERENTIAL/PLATELET - Abnormal; Notable for the following components:      Result Value   Hemoglobin 10.4 (*)    HCT 32.7 (*)    Platelets 403 (*)    Neutro Abs 8.4 (*)    Monocytes Absolute 1.2 (*)    All other components within normal limits  COMPREHENSIVE METABOLIC PANEL - Abnormal; Notable for the following components:   Sodium 127 (*)    Chloride 92 (*)    Glucose, Bld 136 (*)    BUN 29 (*)    Creatinine, Ser 1.19 (*)    Total Protein 6.4 (*)    Albumin 3.0 (*)    GFR calc non Af Amer 45 (*)    GFR calc Af Amer 53 (*)    All other components within normal limits  BRAIN NATRIURETIC PEPTIDE    EKG EKG Interpretation  Date/Time:  Sunday November 12 2017 11:53:50 EDT Ventricular Rate:  75 PR Interval:    QRS Duration: 94 QT Interval:  518 QTC Calculation:  579 R Axis:   39 Text Interpretation:  Sinus rhythm with artifact Low voltage, precordial leads Borderline T wave abnormalities Prolonged QT interval Confirmed by Gareth Morgan [phone])  on 11/12/2017 1:01:08 PM   Radiology Dg Chest Portable 1 View  Result Date: 11/12/2017 CLINICAL DATA:  Shortness of breath, weakness EXAM: PORTABLE CHEST 1 VIEW COMPARISON:  11/04/2017 FINDINGS: Bilateral interstitial thickening. No pleural effusion or pneumothorax. Stable cardiomegaly. No acute osseous abnormality. IMPRESSION: Cardiomegaly with mild pulmonary vascular congestion. Electronically Signed   By: Kathreen Devoid   On: 11/12/2017 12:48    Procedures .Critical Care Performed by: Gareth Morgan, MD Authorized by: Gareth Morgan, MD   Critical care provider statement:    Critical care time (minutes):  30   Critical care was necessary to treat or prevent imminent or life-threatening deterioration of the following conditions:  Cardiac failure   Critical care was time spent personally by me on the following activities:  Evaluation of patient's response to treatment, examination of patient, ordering and performing treatments and interventions, ordering and review of laboratory studies, ordering and review of radiographic studies, pulse oximetry, re-evaluation of patient's condition and review of old charts   (including critical care time)  Medications Ordered in ED Medications  diltiazem (CARDIZEM) 1 mg/mL load via infusion 10 mg (10 mg Intravenous Bolus from Bag 11/12/17 1240)    And  diltiazem (CARDIZEM) 100 mg in dextrose 5% 142mL (1 mg/mL) infusion (5 mg/hr Intravenous New Bag/Given 11/12/17 1240)     Initial Impression / Assessment and Plan / ED Course  I have reviewed the triage vital signs and the nursing notes.  Pertinent labs & imaging results that were available during my care of the patient were reviewed by me and considered in my medical decision making (see chart for details).     72 year old female with a history of congestive heart failure, hypertension, hyperlipidemia, recent diagnosis of atrial fibrillation with recent admission and discharge 2 days ago on  Xarelto, metoprolol and diltiazem, presents with concern for fatigue and shortness of breath. EKG with artifact, suspect sinus rhythm on ED arrival followed immediately by conversion to atrial fibrillation with RVR and rate in the 140s. No chest pain, hemodynamically stable, normal mental status.    Ordered diltiazem bolus and gtt.  Initially on 5 with continued tachycardia, however after continued evaluation she improved to 70s. Discussed with Dr. Marlou Porch and will give 60mg  po diltiazem then begin 240mg  tomorrow and have her follow up.    Patient initially with stability following dilt gtt being discontinued and po medicine, however had mild dyspnea continuing. Attempted to ambulate and patient with significant dyspnea. Portable monitor showed tachycardia to 140 by pulse ox-unclear accuracey-however given significant dyspnea have concerns for continued symptomatic afib and possible RVR.  When patient returned to bed, she was noted to have HR normal, now appearance of flutter waves. She appears tachypneic.  Will give lasix. Added on ddimer. Will admit for continued care.   Final Clinical Impressions(s) / ED Diagnoses   Final diagnoses:  Atrial fibrillation with RVR [facility])    ED Discharge Orders    None       Gareth Morgan, MD 11/12/17 1721

## 2017-11-12 NOTE — Telephone Encounter (Signed)
Pt's daughter called answering service. Upon my return call she says pt is very short of breath even at rest, very weak and unable to even get up. No chest pain. Recently in Hospital for a week with afib on several new meds. No home BP done. Daughter advised to call 911 as pt will need evaluation. She agrees that this is best.   Daune Perch, AGNP-C [place] 11/12/2017  10:29 AM

## 2017-11-12 NOTE — Progress Notes (Addendum)
[place] for Xarelto Indication: atrial fibrillation  Labs: Recent Labs    11/10/17 0455 11/12/17 1216  HGB 11.0* 10.4*  HCT 34.3* 32.7*  PLT 333 403*  CREATININE 1.20* 1.19*    Assessment: 88 yof on Xarelto PTA for afib presenting with SOB and fatigue. Pharmacy consulted to dose Xarelto inpatient - last dose 3/30 PTA per med rec. Noted d-dimer elevated at 3, Hg 10.4, plt 403 on admit. CrCl>50 using TBW. No bleed documented. Lovenox ordered for VTE prophylaxis - not yet given.  Goal of Therapy:  Stroke prevention Monitor platelets by anticoagulation protocol: Yes   Plan:  D/c Lovenox Xarelto 20mg  PO Qsupper Monitor CBC, s/sx bleeding F/u r/o VTE with +d-dimer  Elicia Lamp, PharmD, BCPS Clinical Pharmacist 11/12/2017 6:51 PM

## 2017-11-12 NOTE — Progress Notes (Signed)
Pt arrives to 3 east. C/a/ox3, denies complaints.   Pt transferred to bed from stretcher, she has family at bedside.   VS/WT/HT documented.

## 2017-11-13 ENCOUNTER — Other Ambulatory Visit: Payer: Self-pay

## 2017-11-13 ENCOUNTER — Encounter (HOSPITAL_COMMUNITY): Payer: Self-pay | Admitting: General Practice

## 2017-11-13 ENCOUNTER — Inpatient Hospital Stay (HOSPITAL_COMMUNITY): Payer: PPO

## 2017-11-13 ENCOUNTER — Encounter (HOSPITAL_COMMUNITY)
Admission: EM | Disposition: A | Discharge status: Skilled Nursing Facility | Payer: Self-pay | Source: Home / Self Care | Attending: Student in an Organized Health Care Education/Training Program

## 2017-11-13 ENCOUNTER — Ambulatory Visit (HOSPITAL_COMMUNITY): Admit: 2017-11-13 | Payer: PPO | Admitting: Internal Medicine

## 2017-11-13 DIAGNOSIS — I309 Acute pericarditis, unspecified: Secondary | ICD-10-CM

## 2017-11-13 DIAGNOSIS — Z9989 Dependence on other enabling machines and devices: Secondary | ICD-10-CM

## 2017-11-13 DIAGNOSIS — I314 Cardiac tamponade: Secondary | ICD-10-CM

## 2017-11-13 DIAGNOSIS — E876 Hypokalemia: Secondary | ICD-10-CM

## 2017-11-13 DIAGNOSIS — Z923 Personal history of irradiation: Secondary | ICD-10-CM

## 2017-11-13 DIAGNOSIS — E871 Hypo-osmolality and hyponatremia: Secondary | ICD-10-CM

## 2017-11-13 DIAGNOSIS — G4733 Obstructive sleep apnea (adult) (pediatric): Secondary | ICD-10-CM

## 2017-11-13 DIAGNOSIS — I313 Pericardial effusion (noninflammatory): Principal | ICD-10-CM

## 2017-11-13 DIAGNOSIS — I480 Paroxysmal atrial fibrillation: Secondary | ICD-10-CM

## 2017-11-13 DIAGNOSIS — Z79899 Other long term (current) drug therapy: Secondary | ICD-10-CM

## 2017-11-13 DIAGNOSIS — I3139 Other pericardial effusion (noninflammatory): Secondary | ICD-10-CM

## 2017-11-13 DIAGNOSIS — Z7901 Long term (current) use of anticoagulants: Secondary | ICD-10-CM

## 2017-11-13 DIAGNOSIS — I110 Hypertensive heart disease with heart failure: Secondary | ICD-10-CM

## 2017-11-13 DIAGNOSIS — Z86000 Personal history of in-situ neoplasm of breast: Secondary | ICD-10-CM

## 2017-11-13 DIAGNOSIS — I5030 Unspecified diastolic (congestive) heart failure: Secondary | ICD-10-CM

## 2017-11-13 DIAGNOSIS — K219 Gastro-esophageal reflux disease without esophagitis: Secondary | ICD-10-CM

## 2017-11-13 DIAGNOSIS — E785 Hyperlipidemia, unspecified: Secondary | ICD-10-CM

## 2017-11-13 HISTORY — PX: PERICARDIOCENTESIS: CATH118255

## 2017-11-13 LAB — BASIC METABOLIC PANEL
ANION GAP: 14 (ref 5–15)
BUN: 29 mg/dL — ABNORMAL HIGH (ref 6–20)
CHLORIDE: 90 mmol/L — AB (ref 101–111)
CO2: 25 mmol/L (ref 22–32)
Calcium: 9.1 mg/dL (ref 8.9–10.3)
Creatinine, Ser: 1.41 mg/dL — ABNORMAL HIGH (ref 0.44–1.00)
GFR calc non Af Amer: 37 mL/min — ABNORMAL LOW (ref >60)
GFR, EST AFRICAN AMERICAN: 43 mL/min — AB (ref >60)
Glucose, Bld: 128 mg/dL — ABNORMAL HIGH (ref 65–99)
Potassium: 3.4 mmol/L — ABNORMAL LOW (ref 3.5–5.1)
Sodium: 129 mmol/L — ABNORMAL LOW (ref 135–145)

## 2017-11-13 LAB — BODY FLUID CELL COUNT WITH DIFFERENTIAL
EOS FL: 1 %
Lymphs, Fluid: 17 %
MONOCYTE-MACROPHAGE-SEROUS FLUID: 12 % — AB (ref 50–90)
Neutrophil Count, Fluid: 70 % — ABNORMAL HIGH (ref 0–25)
Total Nucleated Cell Count, Fluid: 2425 cu mm — ABNORMAL HIGH (ref 0–1000)

## 2017-11-13 LAB — ECHOCARDIOGRAM LIMITED
HEIGHTINCHES: 62 in
Weight: 3492.8 oz

## 2017-11-13 SURGERY — PERICARDIOCENTESIS
Anesthesia: LOCAL

## 2017-11-13 MED ORDER — MIDAZOLAM HCL 2 MG/2ML IJ SOLN
INTRAMUSCULAR | Status: DC | PRN
  Administered 2017-11-13: 0.5 mg via INTRAVENOUS

## 2017-11-13 MED ORDER — HEPARIN (PORCINE) IN NACL 2-0.9 UNIT/ML-% IJ SOLN
INTRAMUSCULAR | Status: AC
  Filled 2017-11-13: qty 500

## 2017-11-13 MED ORDER — LIDOCAINE HCL (PF) 1 % IJ SOLN
INTRAMUSCULAR | Status: AC
  Filled 2017-11-13: qty 30

## 2017-11-13 MED ORDER — SODIUM CHLORIDE 0.9 % IV SOLN
INTRAVENOUS | Status: DC
  Administered 2017-11-13: 20:00:00 via INTRAVENOUS

## 2017-11-13 MED ORDER — POTASSIUM CHLORIDE CRYS ER 20 MEQ PO TBCR
40.0000 meq | EXTENDED_RELEASE_TABLET | Freq: Four times a day (QID) | ORAL | Status: AC
  Administered 2017-11-13 (×2): 40 meq via ORAL
  Filled 2017-11-13 (×2): qty 2

## 2017-11-13 MED ORDER — FENTANYL CITRATE (PF) 100 MCG/2ML IJ SOLN
INTRAMUSCULAR | Status: DC | PRN
  Administered 2017-11-13: 12.5 ug via INTRAVENOUS

## 2017-11-13 MED ORDER — MIDAZOLAM HCL 2 MG/2ML IJ SOLN
INTRAMUSCULAR | Status: AC
  Filled 2017-11-13: qty 2

## 2017-11-13 MED ORDER — HEPARIN (PORCINE) IN NACL 2-0.9 UNIT/ML-% IJ SOLN
INTRAMUSCULAR | Status: AC | PRN
  Administered 2017-11-13: 500 mL

## 2017-11-13 MED ORDER — LIDOCAINE HCL (PF) 1 % IJ SOLN
INTRAMUSCULAR | Status: DC | PRN
  Administered 2017-11-13: 12 mL via SUBCUTANEOUS

## 2017-11-13 MED ORDER — FENTANYL CITRATE (PF) 100 MCG/2ML IJ SOLN
INTRAMUSCULAR | Status: AC
  Filled 2017-11-13: qty 2

## 2017-11-13 SURGICAL SUPPLY — 10 items
COVER PRB 48X5XTLSCP FOLD TPE (BAG) IMPLANT
COVER PROBE 5X48 (BAG) ×2
PACK CARDIAC CATHETERIZATION (CUSTOM PROCEDURE TRAY) ×1 IMPLANT
PERIVAC PERICARDIOCENTESIS 8.3 (TRAY / TRAY PROCEDURE) ×1 IMPLANT
PROTECTION STATION PRESSURIZED (MISCELLANEOUS) ×2
SHEATH AVANTI 4FR 11CM (SHEATH) ×1 IMPLANT
STATION PROTECTION PRESSURIZED (MISCELLANEOUS) IMPLANT
TRANSDUCER W/STOPCOCK (MISCELLANEOUS) ×1 IMPLANT
TUBING ART PRESS 72  MALE/FEM (TUBING) ×1
TUBING ART PRESS 72 MALE/FEM (TUBING) IMPLANT

## 2017-11-13 NOTE — Plan of Care (Signed)
  Problem: Pain Managment: Goal: General experience of comfort will improve Outcome: Progressing Note:  Pt is currently experiencing no pain.

## 2017-11-13 NOTE — Progress Notes (Signed)
Pt received from [place]. CHG complete. VSS. Telemetry applied. Pt oriented to unit. Call light in reach. Will continue to monitor.  Clyde Canterbury, RN

## 2017-11-13 NOTE — Progress Notes (Signed)
Called to give report to nurse on [place].  Consent signed for procedure.

## 2017-11-13 NOTE — Consult Note (Signed)
   [facility] CM Inpatient Consult   11/13/2017  Selena Bush 11-May-1946 [id number]   [facility] [place] [facility]. Patient is in the [place] [facility] the [place] [facility] services under patient's [employer] plan.  Patient discharged 11/10/17 was home and developed increased shortness of breath. Patient primary care provider is Reginia Forts, [facility]. Patient was transferred from Telemetry now to [facility] will follow for progress and needs.  This office is listed to provide the transition of care follow up.  Will follow for needs.    For questions contact:   Natividad Brood, RN BSN [facility] Liaison  [phone] business mobile phone Toll free office [phone]

## 2017-11-13 NOTE — Plan of Care (Signed)
  Problem: Education: Goal: Ability to demonstrate management of disease process will improve Outcome: Progressing Goal: Ability to verbalize understanding of medication therapies will improve Outcome: Progressing   Problem: Activity: Goal: Capacity to carry out activities will improve Outcome: Progressing   Problem: Cardiac: Goal: Ability to achieve and maintain adequate cardiopulmonary perfusion will improve Outcome: Progressing   

## 2017-11-13 NOTE — Interval H&P Note (Signed)
History and Physical Interval Note:  11/13/2017 5:37 PM  [name]  has presented today for surgery, with the diagnosis of pericardiocentesis  The various methods of treatment have been discussed with the patient and family. After consideration of risks, benefits and other options for treatment, the patient has consented to  Procedure(s): PERICARDIOCENTESIS (N/A) as a surgical intervention .  The patient's history has been reviewed, patient examined, no change in status, stable for surgery.  I have reviewed the patient's chart and labs.  Questions were answered to the patient's satisfaction.     Selena Bush

## 2017-11-13 NOTE — Discharge Instructions (Addendum)
Given your risk of increased odds of developing a blood clot due to atrial fibrillation we have determined that it is best to hold your Suvorexant [facility]) and raloxifene (EVISTA), for now until you can discuss this with your primary physician, cardiologist and oncologist. Suvorexant [facility]) and raloxifene (EVISTA) are associated with increased risk for hypercoagulable states and could potentially increase your risk of stroke. Please be sure to discuss the risks/benefits of continuing this mediation with your outpatient doctors.   Thank you for visiting [facility].

## 2017-11-13 NOTE — Consult Note (Addendum)
Cardiology Consultation:   Patient ID: Selena Bush; [id number]; 1945-11-12   Admit date: 11/12/2017 Date of Consult: 11/13/2017  Primary Care Provider: Wardell Honour, MD Primary Cardiologist: Dr. Rockey Situ Primary Electrophysiologist:  Dr. Lovena Le   Patient Profile:   Selena Bush is a 72 y.o. female with a hx of diastolic heart failure, HTN, HLD, OSA on CPAP, right breast CA s/p partial mastectomy and radiation (no chemo), bilateral osteoarthritis s/p right TKA 04/16/13 and left TKA 09/15/14, GERD and recent admission for atrial fibrillation who is being seen today for the evaluation of pericardial effusion at the request of Dr. Evette Doffing.   Admitted 3/23-3/29 for afib RVR and hyponatremia 2nd to SIADH.   He was started on sotalol.  She was going in and out of A. Fib and difficult to her rate. Echo demonstrated EF 60-65%, no RWMA, mild MR, LA 42.  The patient was seen by Dr. Lovena Le and recommending continued sotalol.  No cardioversion done.  She was discharged on metoprolol, diltiazem, sotalol and Xarelto. She had atypical chest pain prior to presentation and Dr. Tamala Julian recommended outpatient stress test vs coronary CTA when stable. Troponin were negative.   History of Present Illness:   [name] she felt relatively well on Friday after discharge.  However since Saturday she noted progressively worsening of shortness of breath with and without exertion.  No energy.  She also had orthopnea and dizziness.  EMS was called.  In ER patient found to have A. fib RVR with spontaneous conversion to sinus rhythm.  She was started on IV diltiazem now discontinued.  D-dimer elevated to 3.  BNP 395.  Serum creatinine worsened to 1.4 with potassium of 3.4.  Sodium 129.  Echocardiogram today showed LV function of 40-45% (down from 60-65%) with moderate to large pericardial effusion and cardiology is called for stat consultation.  Her RV is poorly visualized.  Patient is in mild respiratory distress on my  evaluation.  Multiple family members patient during discussion.  She was compliant with medication at home.  Net I&O 600.  Pending transfer to stepdown bed.   Past Medical History:  Diagnosis Date  . Anxiety   . Atrial fibrillation ([place])   . Chronic diastolic CHF (congestive heart failure) ([place])    a. echo 09/2014: EF 14-97%, diastolic dysfunction, mild LVH, nl RV size & systolic function, mildly dilated LA (4.3 cm), mild MR/TR, mildly elevated PASP 36.7 mm Hg  . DDD (degenerative disc disease), cervical   . DDD (degenerative disc disease), lumbar   . Depression   . Diffuse cystic mastopathy 2014  . Dyspnea   . GERD (gastroesophageal reflux disease)   . Headache    rare  . HLD (hyperlipidemia)    a. statin intolerant 2/2 myalgias  . Hypercholesterolemia   . Hypertension   . Malignant neoplasm of upper-outer quadrant of female breast ([place]) 10/2012   Papillary DCIS, sentinel node negative. DR/PR positive. PARTIAL RIGHT MASTECTOMY FOR BREAST CANCER--HAD RADIATION - NO CHEMO --DR. Kelley ONCOLOGIST  . Obesity   . OSA on CPAP   . Osteoarthritis of both knees    a. s/p right TKA 04/2013 & left TKA 09/2014  . Otitis externa   . Personal history of radiation therapy 2015   RIGHT breast-mammosite per pt  . Sleep difficulties    LUNESTA HAS HELPED  . Vaginal cyst     Past Surgical History:  Procedure Laterality Date  . ABDOMINAL HYSTERECTOMY  1992   DUB; fibroids; endometriosis.  One remaining ovary.    [name] BREAST LUMPECTOMY Right 2015   Papillary DCIS, sentinel node negative. DR/PR positive. PARTIAL RIGHT MASTECTOMY FOR BREAST CANCER--HAD RADIATION - NO CHEMO --DR. Ecorse ONCOLOGIST  . BREAST SURGERY Right March 2014   Wide excision,APB RT 10 mm papillary DCIS, ER/PR positive. Sentinel node negative. Partial breast radiation.  [name] CATARACT EXTRACTION, BILATERAL  02/13/2016   [facility].  . CHOLECYSTECTOMY  1994  . COLONOSCOPY  2015   1 benign polyp-every 5 years/ Dr  Candace Cruise  . ERCP  1995  . JOINT REPLACEMENT Right Sept 2014   knee  . TOTAL KNEE ARTHROPLASTY Right 04/16/2013   Procedure: RIGHT TOTAL KNEE ARTHROPLASTY;  Surgeon: Mauri Pole, MD;  Location: [facility];  Service: Orthopedics;  Laterality: Right;  . TOTAL KNEE ARTHROPLASTY Left 09/15/2014   Procedure: LEFT TOTAL KNEE ARTHROPLASTY;  Surgeon: Mauri Pole, MD;  Location: [facility];  Service: Orthopedics;  Laterality: Left;  . TUBAL LIGATION  1979     Inpatient Medications: Scheduled Meds: . diltiazem  60 mg Oral Q8H  . DULoxetine  120 mg Oral Daily  . metoprolol tartrate  75 mg Oral BID  . ramelteon  8 mg Oral QHS  . rivaroxaban  20 mg Oral Q supper  . sertraline  50 mg Oral Daily  . sotalol  120 mg Oral Q12H   Continuous Infusions:  PRN Meds:   Allergies:    Allergies  Allergen Reactions  . Penicillins Anaphylaxis    anaphylaxis  Has patient had a PCN reaction causing immediate rash, facial/tongue/throat swelling, SOB or lightheadedness with hypotension: Yes Has patient had a PCN reaction causing severe rash involving mucus membranes or skin necrosis: No Has patient had a PCN reaction that required hospitalization: No Has patient had a PCN reaction occurring within the last 10 years: No If all of the above answers are "NO", then may proceed with Cephalosporin use.   . Sulfa Antibiotics Anaphylaxis  . Asa [Aspirin]     "burned my stomach intensely"  . Codeine     Gi problems   . Statins Other (See Comments)    Leg pains    Social History:   Social History   Socioeconomic History  . Marital status: Widowed    Spouse name: Selena Bush  . Number of children: 2  . Years of education: College  . Highest education level: Bachelor's degree (e.g., BA, AB, BS)  Occupational History  . Occupation: Retired  Scientific laboratory technician  . Financial resource strain: Not hard at all  . Food insecurity:    Worry: Never true    Inability: Never true  . Transportation needs:    Medical: No     Non-medical: No  Tobacco Use  . Smoking status: Never Smoker  . Smokeless tobacco: Never Used  . Tobacco comment: social smoker as a teen  Substance and Sexual Activity  . Alcohol use: No  . Drug use: No  . Sexual activity: Not Currently    Birth control/protection: Post-menopausal, Surgical  Lifestyle  . Physical activity:    Days per week: 7 days    Minutes per session: 20 min  . Stress: Only a little  Relationships  . Social connections:    Talks on phone: More than three times a week    Gets together: More than three times a week    Attends religious service: More than 4 times per year    Active member of club or organization: Yes  Attends meetings of clubs or organizations: More than 4 times per year    Relationship status: Widowed  . Intimate partner violence:    Fear of current or ex partner: No    Emotionally abused: No    Physically abused: No    Forced sexual activity: No  Other Topics Concern  . Not on file  Social History Narrative   Marital status: widowed since 09/16/2015      Children: 2 children (11, 75); 5 grandchild (4 in [place]; 1 in [place]).      Lives: alone in townhome; 1 dog, 2 cats      Employment: psychiatric Education officer, museum; retired in 2015      Tobacco: teenager only      Alcohol:  None      Drugs: none      Exercise: walking in 2019; more active in 2019.  Walking daily small amounts.        ADLs: drives; independent with ADLs; no assistant devices      Advanced Directives: none; FULL CODE; no prolonged measures.   Does not need them.  Daughter/Heather Vista Deck is HCPOA.     Family History:    Family History  Problem Relation Age of Onset  . Colon cancer Mother   . Cancer Mother 52       colon cancer  . Melanoma Father   . Cancer Father 77       melanoma  . Colon cancer Maternal Grandfather   . Breast cancer Maternal Grandfather      ROS:  Please see the history of present illness.  All other ROS reviewed and negative.     Physical  Exam/Data:   Vitals:   11/13/17 0004 11/13/17 0538 11/13/17 0925 11/13/17 1103  BP: 122/66 108/77  (!) 151/110  Pulse: 60 75 (!) 140 (!) 124  Resp: 20     Temp: 98.2 F (36.8 C) 98.1 F (36.7 C)    TempSrc: Oral Oral    SpO2: 94% 93%  97%  Weight:  218 lb 4.8 oz (99 kg)    Height:        Intake/Output Summary (Last 24 hours) at 11/13/2017 1157 Last data filed at 11/13/2017 0945 Gross per 24 hour  Intake 420 ml  Output 900 ml  Net -480 ml   Filed Weights   11/12/17 1150 11/12/17 1842 11/13/17 0538  Weight: 227 lb 1.2 oz (103 kg) 219 lb 4.8 oz (99.5 kg) 218 lb 4.8 oz (99 kg)   Body mass index is 39.93 kg/m.  General: Obese female in mild respiratory distress HEENT: normal Lymph: no adenopathy Neck: no JVD Endocrine:  No thryomegaly Vascular: No carotid bruits; FA pulses 2+ bilaterally without bruits  Cardiac:  normal S1, S2; irregularly irregular; no murmur  Lungs: Diminished breath sounds with diffuse rales  abd: soft, nontender, no hepatomegaly  Ext: no edema Musculoskeletal:  No deformities, BUE and BLE strength normal and equal Skin: warm and dry  Neuro:  CNs 2-12 intact, no focal abnormalities noted Psych:  Normal affect   EKG:  The EKG was personally reviewed and demonstrates: Sinus rhythm at rate of 70 bpm Telemetry:  Telemetry was personally reviewed and demonstrates: Intermittent atrial fibrillation at rate of 90s  Relevant CV Studies: Echo 11/13/17 Study Conclusions  - Left ventricle: The cavity size was normal. Wall thickness was   increased in a pattern of moderate LVH. Systolic function was   mildly to moderately reduced. The estimated ejection fraction was  in the range of 40% to 45%. - Mitral valve: Mildly calcified annulus. - Left atrium: The atrium was mildly dilated. - Pulmonary arteries: Systolic pressure was mildly increased. PA   peak pressure: 38 mm Hg (S). - Pericardium, extracardiac: A moderate to large pericardial   effusion was  identified circumferential to the heart. Left ventricle:  The cavity size was normal. Wall thickness was increased in a pattern of moderate LVH. Systolic function was mildly to moderately reduced. The estimated ejection fraction was in the range of 40% to 45%.  ------------------------------------------------------------------- Aortic valve:   Structurally normal valve.   Cusp separation was normal.  Doppler:  Transvalvular velocity was within the normal range. There was no stenosis. There was no regurgitation.  ------------------------------------------------------------------- Aorta:  Aortic root: The aortic root was normal in size. Ascending aorta: The ascending aorta was normal in size.  ------------------------------------------------------------------- Mitral valve:   Mildly calcified annulus.  Doppler:   There was no evidence for stenosis.      Valve area by pressure half-time: 5.37 cm^2. Indexed valve area by pressure half-time: 2.52 cm^2/m^2. Peak gradient (D): 3 mm Hg.  ------------------------------------------------------------------- Left atrium:  The atrium was mildly dilated.  ------------------------------------------------------------------- Right ventricle:  Poorly visualized.  ------------------------------------------------------------------- Pulmonic valve:    The valve appears to be grossly normal. Doppler:  There was mild regurgitation.  ------------------------------------------------------------------- Tricuspid valve:   The valve appears to be grossly normal. Doppler:  There was trivial regurgitation.  ------------------------------------------------------------------- Pulmonary artery:   Systolic pressure was mildly increased.  ------------------------------------------------------------------- Right atrium:  Poorly visualized.  ------------------------------------------------------------------- Pericardium:  The RV and RA are poorly  visualized. It&'s difficult to determine if there is collapse. There is significant respiratory variability in the mitral valve inflow suggesting cardiac tamponade. A moderate to large pericardial effusion was identified circumferential to the heart.  ------------------------------------------------------------------- Measurements   Left ventricle                         Value          Reference  LV ID, ED, PLAX chordal        (L)     26.7  mm       43 - 52  LV ID, ES, PLAX chordal        (L)     21.4  mm       23 - 38  LV fx shortening, PLAX chordal (L)     20    %        >=29  LV PW thickness, ED                    14    mm       ---------  IVS/LV PW ratio, ED                    1.04           <=1.3    Ventricular septum                     Value          Reference  IVS thickness, ED                      14.5  mm       ---------    LVOT  Value          Reference  LVOT ID, S                             19    mm       ---------  LVOT area                              2.84  cm^2     ---------    Aorta                                  Value          Reference  Aortic root ID, ED                     32    mm       ---------    Left atrium                            Value          Reference  LA ID, A-P, ES                         50    mm       ---------  LA ID/bsa, A-P                 (H)     2.34  cm/m^2   <=2.2    Mitral valve                           Value          Reference  Mitral E-wave peak velocity            90.2  cm/s     ---------  Mitral A-wave peak velocity            70.3  cm/s     ---------  Mitral deceleration time       (L)     141   ms       150 - 230  Mitral pressure half-time              41    ms       ---------  Mitral peak gradient, D                3     mm Hg    ---------  Mitral E/A ratio, peak                 1.3            ---------  Mitral valve area, PHT, DP             5.37  cm^2     ---------  Mitral valve  area/bsa, PHT, DP         2.52  cm^2/m^2 ---------    Pulmonary arteries                     Value          Reference  PA pressure, S, DP             (  H)     38    mm Hg    <=30    Tricuspid valve                        Value          Reference  Tricuspid regurg peak velocity         239   cm/s     ---------  Tricuspid peak RV-RA gradient          23    mm Hg    ---------    Systemic veins                         Value          Reference  Estimated CVP                          15    mm Hg    ---------    Right ventricle                        Value          Reference  RV pressure, S, DP             (H)     38    mm Hg    <=30  Legend: (L)  and  (H)  mark values outside specified reference range.  Laboratory Data:  Chemistry Recent Labs  Lab 11/10/17 0455 11/12/17 1216 11/13/17 0212  NA 128* 127* 129*  K 3.7 3.7 3.4*  CL 91* 92* 90*  CO2 24 24 25   GLUCOSE 102* 136* 128*  BUN 22* 29* 29*  CREATININE 1.20* 1.19* 1.41*  CALCIUM 9.1 8.9 9.1  GFRNONAA 45* 45* 37*  GFRAA 52* 53* 43*  ANIONGAP 13 11 14     Recent Labs  Lab 11/12/17 1216  PROT 6.4*  ALBUMIN 3.0*  AST 22  ALT 18  ALKPHOS 81  BILITOT 0.8   Hematology Recent Labs  Lab 11/07/17 0648 11/10/17 0455 11/12/17 1216  WBC 9.6 10.0 10.4  RBC 4.06 4.11 3.96  HGB 11.3* 11.0* 10.4*  HCT 33.6* 34.3* 32.7*  MCV 82.8 83.5 82.6  MCH 27.8 26.8 26.3  MCHC 33.6 32.1 31.8  RDW 14.2 13.6 13.7  PLT 319 333 403*   Cardiac EnzymesNo results for input(s): TROPONINI in the last 168 hours.  Recent Labs  Lab 11/12/17 1534 11/12/17 1811  TROPIPOC 0.00 0.00    BNP Recent Labs  Lab 11/12/17 1216  BNP 395.7*    DDimer  Recent Labs  Lab 11/12/17 1812  DDIMER 3.00*    Radiology/Studies:  Dg Chest Portable 1 View  Result Date: 11/12/2017 CLINICAL DATA:  Shortness of breath, weakness EXAM: PORTABLE CHEST 1 VIEW COMPARISON:  11/04/2017 FINDINGS: Bilateral interstitial thickening. No pleural effusion or  pneumothorax. Stable cardiomegaly. No acute osseous abnormality. IMPRESSION: Cardiomegaly with mild pulmonary vascular congestion. Electronically Signed   By: Kathreen Devoid   On: 11/12/2017 12:48    Assessment and Plan:   1. Pericardial effusion -Moderate to large noted by echocardiogram today.  EF down to 40-45% from 60-65 last week.  Her RV is poorly visualized.  She was given IV Lasix 40 mg yesterday with net I&O of 600 cc.  She is in mild distress on my evaluation and pending transfer to stepdown unit.  He also has elevated d-dimer to 3.  He is compliant with Xarelto. Likely stat pericardiocentesis +/- window.   2.  Paroxysmal atrial fibrillation -She is in and out of atrial fibrillation since admission.  Rate relatively controlled.  She is on diltiazem 60 mg 3 times a day, metoprolol 75 mg twice daily and sotalol 120 mg twice daily.  Continue Xarelto for anticoagulation for now.  3. HTN - BP stable. Most recent reading 151/110.   Will review plan with MD.   For questions or updates, please contact [place] Please consult [email or web address] for contact info under Cardiology/STEMI.   Jarrett Soho, PA  11/13/2017 11:57 AM   As above, patient seen and examined.  Briefly she is a 72 year old female with past medical history of diastolic congestive heart failure, hypertension, hyperlipidemia, obstructive sleep apnea, breast cancer and recent diagnosis of atrial fibrillation for evaluation of pericardial effusion with tamponade physiology.  Patient just discharged March 29 following admission for atypical chest pain and paroxysmal atrial fibrillation.  Enzymes were negative.  Echocardiogram showed normal LV function and trivial pericardial effusion.  She was treated with sotalol and Xarelto.  Saturday, March 30 she noticed onset of dyspnea which slowly worsened.  It is now occurring at rest.  She denies fevers, chills, chest pain or palpitations. Heart rate is 122 but patient back  in atrial fibrillation.  Blood pressure 124/93. Laboratory show sodium 129, potassium 3.4 and creatinine 1.41.  Troponin is normal.  Hemoglobin 10.4 with platelet count 403.  Electrocardiogram shows sinus rhythm with low voltage and nonspecific ST changes.  I have personally reviewed the patient's echocardiogram.  She now has a large pericardial effusion with respiratory variation and dilated IVC concerning for tamponade physiology.   1 large pericardial effusion with tamponade physiology on echocardiogram-patient has developed a large pericardial effusion.  In reviewing her history she had a viral syndrome for 2 months leading to her last admission for chest pain and atrial fibrillation.  I wonder if she may have had  viral pericarditis causing her atrial fibrillation.  She was then treated with Xarelto to reduce the risk of embolic event and has now developed a large pericardial effusion possibly secondary to hemorrhagic transformation of pericarditis.  She does have tamponade physiology on echocardiogram and is extremely symptomatic.  I reviewed her echocardiogram with Dr. Aundra Dubin and Dr. Saunders Revel.  We feel she should proceed with pericardiocentesis today.  She did receive Xarelto last evening increasing the risk of bleeding but I am hesitant to watch her effusion overnight given tamponade physiology.  I explained the higher risk of bleeding given recent dose of Xarelto and family and patient understand.  Patient also has a history of breast cancer.  We will send the fluid for cytology as well.  Note I cannot appreciate dissection of ascending aorta on echocardiogram.  2 paroxysmal atrial fibrillation-patient has developed recurrent atrial fibrillation. CHADSvasc 3.  Xarelto will be discontinued because of pericardial effusion and need for pericardiocentesis.  I would not resume following procedure.  We will control heart rate with Cardizem and metoprolol and continue sotalol after procedure.  3  hypertension-follow blood pressure closely after procedure.  Adjust regimen as needed.  [name], MD

## 2017-11-13 NOTE — H&P (View-Only) (Signed)
Cardiology Consultation:   Patient ID: Selena Bush; [id number]; 28-Sep-1945   Admit date: 11/12/2017 Date of Consult: 11/13/2017  Primary Care Provider: Wardell Honour, MD Primary Cardiologist: Dr. Rockey Situ Primary Electrophysiologist:  Dr. Lovena Le   Patient Profile:   Selena Bush is a 72 y.o. female with a hx of diastolic heart failure, HTN, HLD, OSA on CPAP, right breast CA s/p partial mastectomy and radiation (no chemo), bilateral osteoarthritis s/p right TKA 04/16/13 and left TKA 09/15/14, GERD and recent admission for atrial fibrillation who is being seen today for the evaluation of pericardial effusion at the request of Dr. Evette Doffing.   Admitted 3/23-3/29 for afib RVR and hyponatremia 2nd to SIADH.   He was started on sotalol.  She was going in and out of A. Fib and difficult to her rate. Echo demonstrated EF 60-65%, no RWMA, mild MR, LA 42.  The patient was seen by Dr. Lovena Le and recommending continued sotalol.  No cardioversion done.  She was discharged on metoprolol, diltiazem, sotalol and Xarelto. She had atypical chest pain prior to presentation and Dr. Tamala Julian recommended outpatient stress test vs coronary CTA when stable. Troponin were negative.   History of Present Illness:   Selena Bush she felt relatively well on Friday after discharge.  However since Saturday she noted progressively worsening of shortness of breath with and without exertion.  No energy.  She also had orthopnea and dizziness.  EMS was called.  In ER patient found to have A. fib RVR with spontaneous conversion to sinus rhythm.  She was started on IV diltiazem now discontinued.  D-dimer elevated to 3.  BNP 395.  Serum creatinine worsened to 1.4 with potassium of 3.4.  Sodium 129.  Echocardiogram today showed LV function of 40-45% (down from 60-65%) with moderate to large pericardial effusion and cardiology is called for stat consultation.  Her RV is poorly visualized.  Patient is in mild respiratory distress on my  evaluation.  Multiple family members patient during discussion.  She was compliant with medication at home.  Net I&O 600.  Pending transfer to stepdown bed.   Past Medical History:  Diagnosis Date  . Anxiety   . Atrial fibrillation ([place])   . Chronic diastolic CHF (congestive heart failure) ([place])    a. echo 09/2014: EF 75-10%, diastolic dysfunction, mild LVH, nl RV size & systolic function, mildly dilated LA (4.3 cm), mild MR/TR, mildly elevated PASP 36.7 mm Hg  . DDD (degenerative disc disease), cervical   . DDD (degenerative disc disease), lumbar   . Depression   . Diffuse cystic mastopathy 2014  . Dyspnea   . GERD (gastroesophageal reflux disease)   . Headache    rare  . HLD (hyperlipidemia)    a. statin intolerant 2/2 myalgias  . Hypercholesterolemia   . Hypertension   . Malignant neoplasm of upper-outer quadrant of female breast ([place]) 10/2012   Papillary DCIS, sentinel node negative. DR/PR positive. PARTIAL RIGHT MASTECTOMY FOR BREAST CANCER--HAD RADIATION - NO CHEMO --DR. [place] ONCOLOGIST  . Obesity   . OSA on CPAP   . Osteoarthritis of both knees    a. s/p right TKA 04/2013 & left TKA 09/2014  . Otitis externa   . Personal history of radiation therapy 2015   RIGHT breast-mammosite per pt  . Sleep difficulties    LUNESTA HAS HELPED  . Vaginal cyst     Past Surgical History:  Procedure Laterality Date  . ABDOMINAL HYSTERECTOMY  1992   DUB; fibroids; endometriosis.  One remaining ovary.    [name] BREAST LUMPECTOMY Right 2015   Papillary DCIS, sentinel node negative. DR/PR positive. PARTIAL RIGHT MASTECTOMY FOR BREAST CANCER--HAD RADIATION - NO CHEMO --DR. [place] ONCOLOGIST  . BREAST SURGERY Right March 2014   Wide excision,APB RT 10 mm papillary DCIS, ER/PR positive. Sentinel node negative. Partial breast radiation.  [name] CATARACT EXTRACTION, BILATERAL  02/13/2016   [place].  . CHOLECYSTECTOMY  1994  . COLONOSCOPY  2015   1 benign polyp-every 5 years/ Dr  Candace Cruise  . ERCP  1995  . JOINT REPLACEMENT Right Sept 2014   knee  . TOTAL KNEE ARTHROPLASTY Right 04/16/2013   Procedure: RIGHT TOTAL KNEE ARTHROPLASTY;  Surgeon: Mauri Pole, MD;  Location: [facility];  Service: Orthopedics;  Laterality: Right;  . TOTAL KNEE ARTHROPLASTY Left 09/15/2014   Procedure: LEFT TOTAL KNEE ARTHROPLASTY;  Surgeon: Mauri Pole, MD;  Location: [facility];  Service: Orthopedics;  Laterality: Left;  . TUBAL LIGATION  1979     Inpatient Medications: Scheduled Meds: . diltiazem  60 mg Oral Q8H  . DULoxetine  120 mg Oral Daily  . metoprolol tartrate  75 mg Oral BID  . ramelteon  8 mg Oral QHS  . rivaroxaban  20 mg Oral Q supper  . sertraline  50 mg Oral Daily  . sotalol  120 mg Oral Q12H   Continuous Infusions:  PRN Meds:   Allergies:    Allergies  Allergen Reactions  . Penicillins Anaphylaxis    anaphylaxis  Has patient had a PCN reaction causing immediate rash, facial/tongue/throat swelling, SOB or lightheadedness with hypotension: Yes Has patient had a PCN reaction causing severe rash involving mucus membranes or skin necrosis: No Has patient had a PCN reaction that required hospitalization: No Has patient had a PCN reaction occurring within the last 10 years: No If all of the above answers are "NO", then may proceed with Cephalosporin use.   . Sulfa Antibiotics Anaphylaxis  . Asa [Aspirin]     "burned my stomach intensely"  . Codeine     Gi problems   . Statins Other (See Comments)    Leg pains    Social History:   Social History   Socioeconomic History  . Marital status: Widowed    Spouse name: Chrissie Noa  . Number of children: 2  . Years of education: College  . Highest education level: Bachelor's degree (e.g., BA, AB, BS)  Occupational History  . Occupation: Retired  Scientific laboratory technician  . Financial resource strain: Not hard at all  . Food insecurity:    Worry: Never true    Inability: Never true  . Transportation needs:    Medical: No     Non-medical: No  Tobacco Use  . Smoking status: Never Smoker  . Smokeless tobacco: Never Used  . Tobacco comment: social smoker as a teen  Substance and Sexual Activity  . Alcohol use: No  . Drug use: No  . Sexual activity: Not Currently    Birth control/protection: Post-menopausal, Surgical  Lifestyle  . Physical activity:    Days per week: 7 days    Minutes per session: 20 min  . Stress: Only a little  Relationships  . Social connections:    Talks on phone: More than three times a week    Gets together: More than three times a week    Attends religious service: More than 4 times per year    Active member of club or organization: Yes  Attends meetings of clubs or organizations: More than 4 times per year    Relationship status: Widowed  . Intimate partner violence:    Fear of current or ex partner: No    Emotionally abused: No    Physically abused: No    Forced sexual activity: No  Other Topics Concern  . Not on file  Social History Narrative   Marital status: widowed since 09/16/2015      Children: 2 children (70, 56); 5 grandchild (4 in [place]; 1 in [place]).      Lives: alone in townhome; 1 dog, 2 cats      Employment: psychiatric Education officer, museum; retired in 2015      Tobacco: teenager only      Alcohol:  None      Drugs: none      Exercise: walking in 2019; more active in 2019.  Walking daily small amounts.        ADLs: drives; independent with ADLs; no assistant devices      Advanced Directives: none; FULL CODE; no prolonged measures.   Does not need them.  Daughter/Heather Vista Deck is HCPOA.     Family History:    Family History  Problem Relation Age of Onset  . Colon cancer Mother   . Cancer Mother 67       colon cancer  . Melanoma Father   . Cancer Father 45       melanoma  . Colon cancer Maternal Grandfather   . Breast cancer Maternal Grandfather      ROS:  Please see the history of present illness.  All other ROS reviewed and negative.     Physical  Exam/Data:   Vitals:   11/13/17 0004 11/13/17 0538 11/13/17 0925 11/13/17 1103  BP: 122/66 108/77  (!) 151/110  Pulse: 60 75 (!) 140 (!) 124  Resp: 20     Temp: 98.2 F (36.8 C) 98.1 F (36.7 C)    TempSrc: Oral Oral    SpO2: 94% 93%  97%  Weight:  218 lb 4.8 oz (99 kg)    Height:        Intake/Output Summary (Last 24 hours) at 11/13/2017 1157 Last data filed at 11/13/2017 0945 Gross per 24 hour  Intake 420 ml  Output 900 ml  Net -480 ml   Filed Weights   11/12/17 1150 11/12/17 1842 11/13/17 0538  Weight: 227 lb 1.2 oz (103 kg) 219 lb 4.8 oz (99.5 kg) 218 lb 4.8 oz (99 kg)   Body mass index is 39.93 kg/m.  General: Obese female in mild respiratory distress HEENT: normal Lymph: no adenopathy Neck: no JVD Endocrine:  No thryomegaly Vascular: No carotid bruits; FA pulses 2+ bilaterally without bruits  Cardiac:  normal S1, S2; irregularly irregular; no murmur  Lungs: Diminished breath sounds with diffuse rales  abd: soft, nontender, no hepatomegaly  Ext: no edema Musculoskeletal:  No deformities, BUE and BLE strength normal and equal Skin: warm and dry  Neuro:  CNs 2-12 intact, no focal abnormalities noted Psych:  Normal affect   EKG:  The EKG was personally reviewed and demonstrates: Sinus rhythm at rate of 70 bpm Telemetry:  Telemetry was personally reviewed and demonstrates: Intermittent atrial fibrillation at rate of 90s  Relevant CV Studies: Echo 11/13/17 Study Conclusions  - Left ventricle: The cavity size was normal. Wall thickness was   increased in a pattern of moderate LVH. Systolic function was   mildly to moderately reduced. The estimated ejection fraction was  in the range of 40% to 45%. - Mitral valve: Mildly calcified annulus. - Left atrium: The atrium was mildly dilated. - Pulmonary arteries: Systolic pressure was mildly increased. PA   peak pressure: 38 mm Hg (S). - Pericardium, extracardiac: A moderate to large pericardial   effusion was  identified circumferential to the heart. Left ventricle:  The cavity size was normal. Wall thickness was increased in a pattern of moderate LVH. Systolic function was mildly to moderately reduced. The estimated ejection fraction was in the range of 40% to 45%.  ------------------------------------------------------------------- Aortic valve:   Structurally normal valve.   Cusp separation was normal.  Doppler:  Transvalvular velocity was within the normal range. There was no stenosis. There was no regurgitation.  ------------------------------------------------------------------- Aorta:  Aortic root: The aortic root was normal in size. Ascending aorta: The ascending aorta was normal in size.  ------------------------------------------------------------------- Mitral valve:   Mildly calcified annulus.  Doppler:   There was no evidence for stenosis.      Valve area by pressure half-time: 5.37 cm^2. Indexed valve area by pressure half-time: 2.52 cm^2/m^2. Peak gradient (D): 3 mm Hg.  ------------------------------------------------------------------- Left atrium:  The atrium was mildly dilated.  ------------------------------------------------------------------- Right ventricle:  Poorly visualized.  ------------------------------------------------------------------- Pulmonic valve:    The valve appears to be grossly normal. Doppler:  There was mild regurgitation.  ------------------------------------------------------------------- Tricuspid valve:   The valve appears to be grossly normal. Doppler:  There was trivial regurgitation.  ------------------------------------------------------------------- Pulmonary artery:   Systolic pressure was mildly increased.  ------------------------------------------------------------------- Right atrium:  Poorly visualized.  ------------------------------------------------------------------- Pericardium:  The RV and RA are poorly  visualized. It&'s difficult to determine if there is collapse. There is significant respiratory variability in the mitral valve inflow suggesting cardiac tamponade. A moderate to large pericardial effusion was identified circumferential to the heart.  ------------------------------------------------------------------- Measurements   Left ventricle                         Value          Reference  LV ID, ED, PLAX chordal        (L)     26.7  mm       43 - 52  LV ID, ES, PLAX chordal        (L)     21.4  mm       23 - 38  LV fx shortening, PLAX chordal (L)     20    %        >=29  LV PW thickness, ED                    14    mm       ---------  IVS/LV PW ratio, ED                    1.04           <=1.3    Ventricular septum                     Value          Reference  IVS thickness, ED                      14.5  mm       ---------    LVOT  Value          Reference  LVOT ID, S                             19    mm       ---------  LVOT area                              2.84  cm^2     ---------    Aorta                                  Value          Reference  Aortic root ID, ED                     32    mm       ---------    Left atrium                            Value          Reference  LA ID, A-P, ES                         50    mm       ---------  LA ID/bsa, A-P                 (H)     2.34  cm/m^2   <=2.2    Mitral valve                           Value          Reference  Mitral E-wave peak velocity            90.2  cm/s     ---------  Mitral A-wave peak velocity            70.3  cm/s     ---------  Mitral deceleration time       (L)     141   ms       150 - 230  Mitral pressure half-time              41    ms       ---------  Mitral peak gradient, D                3     mm Hg    ---------  Mitral E/A ratio, peak                 1.3            ---------  Mitral valve area, PHT, DP             5.37  cm^2     ---------  Mitral valve  area/bsa, PHT, DP         2.52  cm^2/m^2 ---------    Pulmonary arteries                     Value          Reference  PA pressure, S, DP             (  H)     38    mm Hg    <=30    Tricuspid valve                        Value          Reference  Tricuspid regurg peak velocity         239   cm/s     ---------  Tricuspid peak RV-RA gradient          23    mm Hg    ---------    Systemic veins                         Value          Reference  Estimated CVP                          15    mm Hg    ---------    Right ventricle                        Value          Reference  RV pressure, S, DP             (H)     38    mm Hg    <=30  Legend: (L)  and  (H)  mark values outside specified reference range.  Laboratory Data:  Chemistry Recent Labs  Lab 11/10/17 0455 11/12/17 1216 11/13/17 0212  NA 128* 127* 129*  K 3.7 3.7 3.4*  CL 91* 92* 90*  CO2 24 24 25   GLUCOSE 102* 136* 128*  BUN 22* 29* 29*  CREATININE 1.20* 1.19* 1.41*  CALCIUM 9.1 8.9 9.1  GFRNONAA 45* 45* 37*  GFRAA 52* 53* 43*  ANIONGAP 13 11 14     Recent Labs  Lab 11/12/17 1216  PROT 6.4*  ALBUMIN 3.0*  AST 22  ALT 18  ALKPHOS 81  BILITOT 0.8   Hematology Recent Labs  Lab 11/07/17 0648 11/10/17 0455 11/12/17 1216  WBC 9.6 10.0 10.4  RBC 4.06 4.11 3.96  HGB 11.3* 11.0* 10.4*  HCT 33.6* 34.3* 32.7*  MCV 82.8 83.5 82.6  MCH 27.8 26.8 26.3  MCHC 33.6 32.1 31.8  RDW 14.2 13.6 13.7  PLT 319 333 403*   Cardiac EnzymesNo results for input(s): TROPONINI in the last 168 hours.  Recent Labs  Lab 11/12/17 1534 11/12/17 1811  TROPIPOC 0.00 0.00    BNP Recent Labs  Lab 11/12/17 1216  BNP 395.7*    DDimer  Recent Labs  Lab 11/12/17 1812  DDIMER 3.00*    Radiology/Studies:  Dg Chest Portable 1 View  Result Date: 11/12/2017 CLINICAL DATA:  Shortness of breath, weakness EXAM: PORTABLE CHEST 1 VIEW COMPARISON:  11/04/2017 FINDINGS: Bilateral interstitial thickening. No pleural effusion or  pneumothorax. Stable cardiomegaly. No acute osseous abnormality. IMPRESSION: Cardiomegaly with mild pulmonary vascular congestion. Electronically Signed   By: Kathreen Devoid   On: 11/12/2017 12:48    Assessment and Plan:   1. Pericardial effusion -Moderate to large noted by echocardiogram today.  EF down to 40-45% from 60-65 last week.  Her RV is poorly visualized.  She was given IV Lasix 40 mg yesterday with net I&O of 600 cc.  She is in mild distress on my evaluation and pending transfer to stepdown unit.  He also has elevated d-dimer to 3.  He is compliant with Xarelto. Likely stat pericardiocentesis +/- window.   2.  Paroxysmal atrial fibrillation -She is in and out of atrial fibrillation since admission.  Rate relatively controlled.  She is on diltiazem 60 mg 3 times a day, metoprolol 75 mg twice daily and sotalol 120 mg twice daily.  Continue Xarelto for anticoagulation for now.  3. HTN - BP stable. Most recent reading 151/110.   Will review plan with MD.   For questions or updates, please contact [place] Please consult [email or web address] for contact info under Cardiology/STEMI.   Jarrett Soho, PA  11/13/2017 11:57 AM   As above, patient seen and examined.  Briefly she is a 72 year old female with past medical history of diastolic congestive heart failure, hypertension, hyperlipidemia, obstructive sleep apnea, breast cancer and recent diagnosis of atrial fibrillation for evaluation of pericardial effusion with tamponade physiology.  Patient just discharged March 29 following admission for atypical chest pain and paroxysmal atrial fibrillation.  Enzymes were negative.  Echocardiogram showed normal LV function and trivial pericardial effusion.  She was treated with sotalol and Xarelto.  Saturday, March 30 she noticed onset of dyspnea which slowly worsened.  It is now occurring at rest.  She denies fevers, chills, chest pain or palpitations. Heart rate is 122 but patient back  in atrial fibrillation.  Blood pressure 124/93. Laboratory show sodium 129, potassium 3.4 and creatinine 1.41.  Troponin is normal.  Hemoglobin 10.4 with platelet count 403.  Electrocardiogram shows sinus rhythm with low voltage and nonspecific ST changes.  I have personally reviewed the patient's echocardiogram.  She now has a large pericardial effusion with respiratory variation and dilated IVC concerning for tamponade physiology.   1 large pericardial effusion with tamponade physiology on echocardiogram-patient has developed a large pericardial effusion.  In reviewing her history she had a viral syndrome for 2 months leading to her last admission for chest pain and atrial fibrillation.  I wonder if she may have had  viral pericarditis causing her atrial fibrillation.  She was then treated with Xarelto to reduce the risk of embolic event and has now developed a large pericardial effusion possibly secondary to hemorrhagic transformation of pericarditis.  She does have tamponade physiology on echocardiogram and is extremely symptomatic.  I reviewed her echocardiogram with Dr. Aundra Dubin and Dr. Saunders Revel.  We feel she should proceed with pericardiocentesis today.  She did receive Xarelto last evening increasing the risk of bleeding but I am hesitant to watch her effusion overnight given tamponade physiology.  I explained the higher risk of bleeding given recent dose of Xarelto and family and patient understand.  Patient also has a history of breast cancer.  We will send the fluid for cytology as well.  Note I cannot appreciate dissection of ascending aorta on echocardiogram.  2 paroxysmal atrial fibrillation-patient has developed recurrent atrial fibrillation. CHADSvasc 3.  Xarelto will be discontinued because of pericardial effusion and need for pericardiocentesis.  I would not resume following procedure.  We will control heart rate with Cardizem and metoprolol and continue sotalol after procedure.  3  hypertension-follow blood pressure closely after procedure.  Adjust regimen as needed.  [name], MD

## 2017-11-13 NOTE — Progress Notes (Signed)
  Echocardiogram 2D Echocardiogram has been performed.  [name] G Keely Drennan 11/13/2017, 11:01 AM

## 2017-11-13 NOTE — Progress Notes (Signed)
PT Cancellation Note  Patient Details Name: Selena Bush MRN: [id number] DOB: [birth year]   Cancelled Treatment:    Reason Eval/Treat Not Completed: Medical issues which prohibited therapy Attempted to evaluate patient this morning, HR at baseline of 73-122BPM, but then elevated and sustained between 126-146BPM at rest in supine, very dyspneic and with labored breathing as well. Unsafe to attempt PT at this time due to HR elevations. Hold PT for today, will attempt back when patient is medically stable and safe to participate in PT session.    Deniece Ree PT, DPT, CBIS  Supplemental Physical Therapist [facility]   Pager [phone]

## 2017-11-13 NOTE — Progress Notes (Signed)
D-dimer was positive (3.0) MD notified, pt is on Xarelto for new onset of Afib, no s/s, will continue to monitor, Thanks Buckner Malta.

## 2017-11-13 NOTE — Progress Notes (Signed)
RN Anderson Malta called for a second set of eyes. Family is upset regarding patient care. Dr. Berline Lopes and Curly [place] PA with cardiology at bedside. Family was updated on plan of care for patient and progressive bed status. No other interventions from me. Encouraged RN to call for any concerns

## 2017-11-13 NOTE — Progress Notes (Signed)
Pt BP 129/103. Cath lab notified of narrowing pulse pressure. Pt symptomatic. Will continue to monitor.  Clyde Canterbury, RN

## 2017-11-13 NOTE — Progress Notes (Signed)
Family appears upset regarding patient care. Patient is awaiting bed to be transferred to stepdown. Rapid response notified by primary RN. Patient family asking to speak with nursing director; business card of [employer] given to family for contact.

## 2017-11-13 NOTE — Progress Notes (Signed)
Internal Medicine Attending:   I saw and examined the patient. I reviewed the resident's note and I agree with the resident's findings and plan as documented in the resident's note.  72 year old woman with acute large pericardial effusion causing symptoms consistent with early cardiac tamponade. Blood pressure is currently stable, though respiratory symptoms persist.  We appreciate cardiology's quick consultation and they are planning for pericardiocentesis today.  I think based off of the fluid testing we will have a better idea of the etiology, inflammatory versus hemorrhagic versus malignant.  Based on the hospital course and drain output, or reaccumulation, we will know if we will need to refer for surgical pericardial window.

## 2017-11-13 NOTE — Progress Notes (Signed)
Subjective: The patient was lying in her bed today sitting upright in her bed today upon entering the room.  She continues to be concerned with increased dyspnea while lying in her bed without exertion.  Patient states that she is no better off than she was the date the day of admission and continues to feel short of breath and fatigued in general.  Objective:  Vital signs in last 24 hours: Vitals:   11/13/17 0925 11/13/17 1103 11/13/17 1224 11/13/17 1447  BP:  (!) 151/110 (!) 124/93 (!) 135/107  Pulse: (!) 140 (!) 124 (!) 122 (!) 33  Resp:   20 (!) 26  Temp:   (!) 97.3 F (36.3 C) 97.6 F (36.4 C)  TempSrc:   Oral Oral  SpO2:  97% 94% 100%  Weight:      Height:       ROS negative except as per HPI.  Physical exam: General: Patient in moderate respiratory distress with increased work of breathing, conversant, alert and oriented x3, afebrile, but distal extremities cool but not cyanotic. Neuro: Alert and oriented x3 HEENT: JVD of approximately 10 cm Cardiovascular: Heart sounds are distant, no murmur auscultated, no rub auscultated Pulmonary: Notable for bilateral crackles in the lower lung fields GI: Abdomen soft, nontender, but is displays paradoxical respiratory motion. Musculoskeletal: Distal extremities are cool, nontender, nonedematous  Assessment/Plan:  Active Problems:   Pericardial effusion with cardiac tamponade   Essential hypertension, benign   Hyponatremia   Sleep apnea   Hypokalemia   Atrial fibrillation with RVR (HCC)   Shortness of breath  Assessment: Selena Bush is a 72 year old female with a past medical history notable for hypertension, HFpEF (EF 60-65%) HLD, DCIS status post resection and radiation, atrial fibrillation, OSA, and hypokalemia who presented with fatigue and shortness of breath times 1.5 days.  She stated that following discharge on 11/10/2017 she began to feel significantly at first, but worsened rapidly shortly therafter.  When she was  no longer able to tolerate her dyspnea she presented to the ED where there was no clear evidence of volume overload at that time other than marked JVD.  Echocardiogram on 11/05/2017 demonstrated the HFpEF but no notable pericardial inflammation or effusion.  Repeat echocardiogram during this admission revealed a moderate sized circumferential pericardial effusion with tamponade physiology as well as a small left-sided pleural effusion.  Cardiology opted for pericardiocentesis due to the Tamponade physiology. At this time I feel that the patient's current symptoms are consistent with the pericardial tamponade and to a lesser degree the pleural effusion.   Plan: Pericardial effusion with cardiac Tamponade: Evaluation differential indicated need for pericardiocentesis and post procedure with drain placement she appears extremely symptomatic from the effusion.  Although she is recent received a dose of Xarelto given the presentation and severity of her symptoms the benefits outweigh the risks of possible hemorrhage secondary to anticoagulation and invasive procedure.  We greatly appreciate cardiology's assistance with our patient -Placed orders for pericardial cytology, Gram stain, cell count  Paroxysmal atrial fibrillation: The patient was admitted with recurrent atrial fibrillation with a chads vas score of 3 ALT however, Xarelto has been discontinued given the need for pericardiocentesis.  The recurrent atrial arrhythmia was most likely secondary to the pericardial effusion. We will continue Cardizem and metoprolol as per cardiology recommendations.  They recommended discontinuing sotalol until after the procedure. -Continue Cardizem -Continue with metoprolol  Hyponatremia: Fluid restriction to 1500 mL's daily given the likelihood of the patient's hyponatremia is possibly due  to SIADH which is partly diagnosed on her admission earlier from 3/23-3/29  Hypertension: Closely follow patient's blood  pressure today and adjust her medication regimen appropriately.  Given the concerning for hypertension with Physiology, We Will Bolus with IV Fluids As Necessary.The patient's most recent blood pressure 135/107 and she has continued to be mildly hypertensive although tachycardic since admission.  -Holding the patient's hydrochlorthiazide lisinopril combo tablet  Hypokalemia: Potassium 3.4 and admission. -Replete with 40 mEq p.o. potassium every 6 hours today -Repeat BMP in a.m.  History of OSA: Continue CPAP nightly  GERD: Continue his omeprazole 5 mg daily  Dispo: Anticipated discharge in approximately 3-4 day(s).   Kathi Ludwig, MD 11/13/2017, 3:24 PM Pager: Pager# [phone]

## 2017-11-13 NOTE — Progress Notes (Addendum)
CPAP set up (new transfer to unit) and patient placed on 10.0 cm h20 via nasal mask wit 2lpm O2 bleed in. RN aware.

## 2017-11-13 NOTE — Progress Notes (Signed)
  Echocardiogram 2D Echocardiogram has been performed.  Selena Bush 11/13/2017, 7:04 PM

## 2017-11-13 NOTE — Progress Notes (Signed)
Called rapid for my patient.  Patient is labor breathing, HR 124- 130s. Some cyanosis in lower extremity.   [facility] did put in for transfer, but patient's family especially concern regarding patient's overall health decline from yesterday.  Family is very angry- concerned we are not giving patient the care she needs.   Would like to talk to cardiologist right now.    Called [facility] MD- trying to see if we could get cardiologist up here sooner rather than later.

## 2017-11-14 ENCOUNTER — Encounter (HOSPITAL_COMMUNITY): Payer: Self-pay | Admitting: Internal Medicine

## 2017-11-14 ENCOUNTER — Inpatient Hospital Stay (HOSPITAL_COMMUNITY): Payer: PPO

## 2017-11-14 ENCOUNTER — Encounter (HOSPITAL_COMMUNITY): Payer: PPO | Admitting: Nurse Practitioner

## 2017-11-14 DIAGNOSIS — I4891 Unspecified atrial fibrillation: Secondary | ICD-10-CM

## 2017-11-14 DIAGNOSIS — I313 Pericardial effusion (noninflammatory): Secondary | ICD-10-CM

## 2017-11-14 LAB — ECHOCARDIOGRAM LIMITED
HEIGHTINCHES: 62 in
Height: 62 in
WEIGHTICAEL: 3492.8 [oz_av]
Weight: 3492.8 oz

## 2017-11-14 LAB — BASIC METABOLIC PANEL
Anion gap: 12 (ref 5–15)
BUN: 33 mg/dL — AB (ref 6–20)
CHLORIDE: 93 mmol/L — AB (ref 101–111)
CO2: 26 mmol/L (ref 22–32)
Calcium: 9.1 mg/dL (ref 8.9–10.3)
Creatinine, Ser: 1.22 mg/dL — ABNORMAL HIGH (ref 0.44–1.00)
GFR calc Af Amer: 51 mL/min — ABNORMAL LOW (ref >60)
GFR, EST NON AFRICAN AMERICAN: 44 mL/min — AB (ref >60)
Glucose, Bld: 118 mg/dL — ABNORMAL HIGH (ref 65–99)
POTASSIUM: 4.3 mmol/L (ref 3.5–5.1)
SODIUM: 131 mmol/L — AB (ref 135–145)

## 2017-11-14 LAB — PROTEIN, BODY FLUID (OTHER): Total Protein, Body Fluid Other: 5.6 g/dL

## 2017-11-14 LAB — CBC
HEMATOCRIT: 35.2 % — AB (ref 36.0–46.0)
Hemoglobin: 11 g/dL — ABNORMAL LOW (ref 12.0–15.0)
MCH: 26.4 pg (ref 26.0–34.0)
MCHC: 31.3 g/dL (ref 30.0–36.0)
MCV: 84.4 fL (ref 78.0–100.0)
PLATELETS: 362 10*3/uL (ref 150–400)
RBC: 4.17 MIL/uL (ref 3.87–5.11)
RDW: 13.6 % (ref 11.5–15.5)
WBC: 10.3 10*3/uL (ref 4.0–10.5)

## 2017-11-14 LAB — GLUCOSE, BODY FLUID OTHER: GLUCOSE, BODY FLUID OTHER: 90 mg/dL

## 2017-11-14 LAB — PH, BODY FLUID: pH, Body Fluid: 7.4

## 2017-11-14 LAB — MRSA PCR SCREENING: MRSA BY PCR: NEGATIVE

## 2017-11-14 LAB — LD, BODY FLUID (OTHER): LD, BODY FLUID: 423 IU/L

## 2017-11-14 MED ORDER — FUROSEMIDE 10 MG/ML IJ SOLN
20.0000 mg | Freq: Once | INTRAMUSCULAR | Status: AC
  Administered 2017-11-14: 20 mg via INTRAVENOUS
  Filled 2017-11-14: qty 2

## 2017-11-14 MED ORDER — SODIUM CHLORIDE 0.9 % IV BOLUS
1000.0000 mL | Freq: Once | INTRAVENOUS | Status: AC
  Administered 2017-11-14: 1000 mL via INTRAVENOUS

## 2017-11-14 MED ORDER — ACETAMINOPHEN 325 MG PO TABS
650.0000 mg | ORAL_TABLET | Freq: Four times a day (QID) | ORAL | Status: DC | PRN
  Administered 2017-11-14 – 2017-11-16 (×5): 650 mg via ORAL
  Filled 2017-11-14 (×5): qty 2

## 2017-11-14 NOTE — Progress Notes (Signed)
PT Cancellation Note  Patient Details Name: LAMONDA NOXON MRN: [id number] DOB: 07-24-1946   Cancelled Treatment:    Reason Eval/Treat Not Completed: Medical issues which prohibited therapy. Per RN, pt with pericardial drain preventing OOB mobility, also with increased respiratory needs. Will follow-up for PT evaluation when appropriate.  Mabeline Caras, PT, DPT Acute Rehab Services  Pager: [place] 11/14/2017, 1:37 PM

## 2017-11-14 NOTE — Progress Notes (Signed)
Pt has not been able to urinate tonight, bladder scan shows 675ml, contacted cards fellow Dr. Kenton Kingfisher and he stated to place foley while on bedrest.

## 2017-11-14 NOTE — Progress Notes (Signed)
eLink Physician-Brief Progress Note Patient Name: Selena Bush DOB: 1945/11/01 MRN: [id number]   Date of Service  11/14/2017  HPI/Events of Note  Notified of need for DVT prophylaxis - Given recent pericardiocentesis will avoid Heparin or Lovenox.   eICU Interventions  Will order: 1. Place SCD's.     Intervention Category Intermediate Interventions: Best-practice therapies (e.g. DVT, beta blocker, etc.)  [name] [place] 11/14/2017, 1:50 AM

## 2017-11-14 NOTE — Progress Notes (Signed)
   I saw and examined the patient. I reviewed the resident's note and I agree with the resident's findings and plan as documented in the resident's note.  Patient is sleeping this morning on CPAP, increased respiratory effort is much improved compared to yesterday, she now appears comfortable. Greatly appreciate cardiology intervention on large pericardial effusion which has resolved the early tamponade signs. Left pleural effusion also looks improved today on POCUS. Cell count shows neutrophil predominant white count, no other signs of purulent pericarditis, gram stain and culture negative so far, so will continue to hold on antibiotics. Drain management per cardiology, we will see how she does in the coming days.   Hyponatremia previously diagnosed as SIADH, but likely worsened by tamponade and poor cardiac output. I agree with gentle fluids today, can recheck serum sodium in the morning to monitor.  Axel Filler, MD 11/14/2017, 11:14 AM

## 2017-11-14 NOTE — Progress Notes (Signed)
Patient appears to be doing well this p.m.  Blood pressure is 107/68.  Heart rate is 67 in sinus rhythm.  She was hydrated for hypotension earlier but now has diminished breath sounds at her bases.  I will give Lasix 20 mg IV x1.  We will likely remove pericardial drain tomorrow morning.  Follow-up echocardiogram today shows no pericardial effusion.  We will hold Cardizem as blood pressure is borderline. [name], MD

## 2017-11-14 NOTE — Progress Notes (Signed)
Placed patient on CPAP via nasal mask, previous settings of 10.0 cm H20 with 4;pm O2 bleed in. Tolerating well at this time. RN aware.

## 2017-11-14 NOTE — Progress Notes (Signed)
Progress Note  Patient Name: Selena Bush Date of Encounter: 11/14/2017  Primary Cardiologist: Dr Rockey Situ  Subjective   Pt denies CP or dyspnea  Inpatient Medications    Scheduled Meds: . diltiazem  60 mg Oral Q8H  . DULoxetine  120 mg Oral Daily  . metoprolol tartrate  75 mg Oral BID  . ramelteon  8 mg Oral QHS  . sertraline  50 mg Oral Daily  . sotalol  120 mg Oral Q12H   Continuous Infusions: . sodium chloride 10 mL/hr at 11/13/17 2015   PRN Meds: acetaminophen   Vital Signs    Vitals:   11/14/17 0300 11/14/17 0400 11/14/17 0410 11/14/17 0530  BP: 102/66 (!) 83/65 (!) 87/56 90/67  Pulse:  (!) 57    Resp: 13 11 11 19   Temp:  98 F (36.7 C)    TempSrc:  Oral    SpO2:  99% 97%   Weight:      Height:        Intake/Output Summary (Last 24 hours) at 11/14/2017 0713 Last data filed at 11/14/2017 0600 Gross per 24 hour  Intake 120 ml  Output 375 ml  Net -255 ml   Filed Weights   11/12/17 1150 11/12/17 1842 11/13/17 0538  Weight: 227 lb 1.2 oz (103 kg) 219 lb 4.8 oz (99.5 kg) 218 lb 4.8 oz (99 kg)    Telemetry    PAF; post termination pause of 3 sec- Personally Reviewed   Physical Exam   GEN: WD No acute distress.   Neck: No JVD Cardiac: irregular Respiratory: Mild basilar crackles GI: Soft, nontender, non-distended, pericardial drain in place MS: No edema Neuro:  Nonfocal  Psych: Normal affect   Labs    Chemistry Recent Labs  Lab 11/12/17 1216 11/13/17 0212 11/14/17 0242  NA 127* 129* 131*  K 3.7 3.4* 4.3  CL 92* 90* 93*  CO2 24 25 26   GLUCOSE 136* 128* 118*  BUN 29* 29* 33*  CREATININE 1.19* 1.41* 1.22*  CALCIUM 8.9 9.1 9.1  PROT 6.4*  --   --   ALBUMIN 3.0*  --   --   AST 22  --   --   ALT 18  --   --   ALKPHOS 81  --   --   BILITOT 0.8  --   --   GFRNONAA 45* 37* 44*  GFRAA 53* 43* 51*  ANIONGAP 11 14 12      Hematology Recent Labs  Lab 11/10/17 0455 11/12/17 1216 11/14/17 0242  WBC 10.0 10.4 10.3  RBC 4.11 3.96 4.17   HGB 11.0* 10.4* 11.0*  HCT 34.3* 32.7* 35.2*  MCV 83.5 82.6 84.4  MCH 26.8 26.3 26.4  MCHC 32.1 31.8 31.3  RDW 13.6 13.7 13.6  PLT 333 403* 362    Recent Labs  Lab 11/12/17 1534 11/12/17 1811  TROPIPOC 0.00 0.00     BNP Recent Labs  Lab 11/12/17 1216  BNP 395.7*     DDimer  Recent Labs  Lab 11/12/17 1812  DDIMER 3.00*     Radiology    Dg Chest Port 1 View  Result Date: 11/13/2017 CLINICAL DATA:  Status post pericardial drain placement EXAM: PORTABLE CHEST 1 VIEW COMPARISON:  11/12/2017, 11/04/2017 FINDINGS: A drainage catheter overlies the left lower chest. Cardiomegaly. Subsegmental atelectasis at the right base. Increased airspace disease at the left lung base. No pneumothorax. IMPRESSION: 1. Placement of drainage catheter over the left lower chest 2. Increased airspace disease at  the left base may reflect increasing atelectasis or infiltrate. Possible small left effusion. 3. Subsegmental atelectasis at the right base 4. Cardiomegaly Electronically Signed   By: Donavan Foil M.D.   On: 11/13/2017 20:20   Dg Chest Portable 1 View  Result Date: 11/12/2017 CLINICAL DATA:  Shortness of breath, weakness EXAM: PORTABLE CHEST 1 VIEW COMPARISON:  11/04/2017 FINDINGS: Bilateral interstitial thickening. No pleural effusion or pneumothorax. Stable cardiomegaly. No acute osseous abnormality. IMPRESSION: Cardiomegaly with mild pulmonary vascular congestion. Electronically Signed   By: Kathreen Devoid   On: 11/12/2017 12:48    Patient Profile     72 year old female with past medical history of diastolic congestive heart failure, hypertension, hyperlipidemia, obstructive sleep apnea, breast cancer and recent diagnosis of atrial fibrillation with pericardial effusion with tamponade physiology.  Patient discharged March 29 following admission for atypical chest pain and paroxysmal atrial fibrillation.  Echocardiogram showed normal LV function and trivial pericardial effusion.  She was  treated with sotalol and Xarelto.  Saturday, March 30 she noticed onset of dyspnea which slowly worsened. FU echocardiogram showed large pericardial effusion with respiratory variation and dilated IVC concerning for tamponade physiology. Had pericardiocentesis 4/1.    Assessment & Plan    1 large pericardial effusion with tamponade physiology on echocardiogram-patient is now status post pericardiocentesis.  She is markedly improved symptomatically.  We will plan to repeat limited echocardiogram this morning to reassess effusion.  Pericardial drain is in place.  We will follow today.  If less than 50 cc/h output will discontinue drain later today.  I think the most likely unifying diagnosis is recent pericarditis; xarelto then initiated for PAF resulting in hemorrhagic pericardial effusion.  We will await cultures and cytology of pericardial fluid.    2 paroxysmal atrial fibrillation-patient continues to have episodes of paroxysmal atrial fibrillation.  We will continue with Cardizem and metoprolol for rate control as tolerated by blood pressure.  Continue sotalol.  I am hopeful that paroxysmal atrial fibrillation will improve as pericardial process resolves.  We will not anticoagulate given recent hemorrhagic pericardial effusion.    3 hypertension-blood pressure is borderline.  I will hold lisinopril HCT as well as Lasix.  We will continue Cardizem and metoprolol for rate control of atrial fibrillation as tolerated.  Follow blood pressure and adjust regimen as needed.   For questions or updates, please contact [place] Please consult [email or web address] for contact info under Cardiology/STEMI.      Signed, [name], MD  11/14/2017, 7:13 AM

## 2017-11-14 NOTE — Care Management Note (Signed)
Case Management Note Marvetta Gibbons RN, BSN Unit 4E-Case Manager- [place] coverage [phone]  Patient Details  Name: Selena Bush MRN: [id number] Date of Birth: 03-Feb-1946  Subjective/Objective:  Pt admitted with SOB- large pericardial effusion with tamponade physiology on echocardiogram-patient is now status post pericardiocentesis                  Action/Plan: PTA Pt lived at home- has large family support system. Referral received regarding home CPAP needs- spoke with pt and family at the bedside- concerns expressed are that pt has a broken CPAP at home through [place]- has had it about 11 yrs- no home 02 at this time. Pt states she has been trying to get new one- however insurance will not cover a new one without a new sleep study- pt does state that she has been working on this with her PCP- but has not been able to get into a sleep study yet. Family is concerned about pt returning home without a working CPAP- call made to [facility]- hospital rep with [facility]- to discuss what might could be done- per conversation with Jeneen Rinks- option may be to do a home sleep study which can sometimes be done quicker than sleep centers- also per [employer]'s record pt has Bipap at home- [facility] can work with pt's PCP for orders regarding Home Sleep study- in speaking to pt again after TC with [employer]- pt reports that she had a bipap however it was changed to a cpap after she lost some weight. Also she does not really want to do a home sleep study. Will f/u again with Jeneen Rinks at [place] for further options about home cpap- pt may not have any option for new cpap until she gets new sleep study per insurance guidelines.   Expected Discharge Date:                  Expected Discharge Plan:  Home/Self Care  In-House Referral:     Discharge planning Services  CM Consult  Post Acute Care Choice:  Durable Medical Equipment Choice offered to:     DME Arranged:    DME Agency:     HH Arranged:    [place] Agency:     Status of Service:   In process, will continue to follow  If discussed at Long Length of Stay Meetings, dates discussed:    Discharge Disposition:   Additional Comments:  Dawayne Patricia, RN 11/14/2017, 5:03 PM

## 2017-11-14 NOTE — Progress Notes (Signed)
  Echocardiogram 2D Echocardiogram has been performed.  Selena Bush M 11/14/2017, 10:59 AM

## 2017-11-14 NOTE — Progress Notes (Addendum)
Subjective: Patient sitting out of bed today for entering the room.  She continues to use her CPAP as she has been napping this a.m.  Patient denies shortness of breath, chest pain, back pain, diaphoresis, nausea, vomiting, abdominal pain, or other concerning symptoms.  She stated that she tolerated the procedure well continues to feel better sin following the procedure.  Objective:  Vital signs in last 24 hours: Vitals:   11/14/17 0300 11/14/17 0400 11/14/17 0410 11/14/17 0530  BP: 102/66 (!) 83/65 (!) 87/56 90/67  Pulse:  (!) 57    Resp: 13 11 11 19   Temp:  98 F (36.7 C)    TempSrc:  Oral    SpO2:  99% 97%   Weight:      Height:       ROS negative except as per HPI.  Physical Exam: General: No acute distress alert and oriented x4 conversant Neuro: No focal deficits on exam HEENT: JVD decreased no longer visible Cardiovascular: RRR, no murmur rubs or gallops Pulmonary: Mild basilar crackles bilaterally GI: soft, nontender, nondistended, pericardial drain in place and nontender with clean and clear bandaging Muscular skeletal: Bilateral distal extremities are nonedematous, nontender   Assessment/Plan:  Active Problems:   Pericardial effusion with cardiac tamponade   Essential hypertension, benign   Hyponatremia   Sleep apnea   Hypokalemia   Atrial fibrillation with RVR (HCC)   Shortness of breath  Assessment: Claudie Brickhouse is a 72 year old female with a past medical history notable for hypertension, HFpEF (EF 60-65%) HLD, DCIS status post resection and radiation, atrial fibrillation, OSA, and hypokalemia who presented with fatigue and shortness of breath times 1.5 days.  She stated that following discharge on 11/10/2017 she began to feel significantly at first, but worsened rapidly shortly therafter.  When she was no longer able to tolerate her dyspnea she presented to the ED where there was no clear evidence of volume overload at that time other than marked JVD.   Echocardiogram on 11/05/2017 demonstrated the HFpEF but no notable pericardial inflammation or effusion.  Repeat echocardiogram during this admission revealed a moderate sized circumferential pericardial effusion with tamponade physiology as well as a small left-sided pleural effusion.  Cardiology opted for pericardiocentesis due to the Tamponade physiology. At this time I feel that the patient's current symptoms are consistent with the pericardial tamponade and to a lesser degree the pleural effusion.  Patient attested to be a resolution of her dyspnea following the pericardiocentesis and continues to improve symptomatically.   Plan: Subacute pericardial effusion with cardiac tamponade: Cardiology performed a pericardiocentesis removing 6-20 mL of bloody fluid.  This was sent for evaluation with cytology, Gram stain, cell count, LDH, protein, glucose, many which are pending.  Will differ to Cardiology for treatment with Xarelto to cardiology with patient atrial fibrillation at this time.  We appreciate cardiology's assistance with pressured patient.  Paroxysmal atrial fibrillation: Patient was admitted recently with fibrillation chads2vas score of 3.  As above continue rate control for holding anticoagulation defer to cardiology as to when to resume. -Continue Cardizem 60 mg every 8 hours -Continue metoprolol 75 mg twice daily -Continue sotalol 120 mg every 12 hours  History of hypertension: Patient has been mildly hypotensive following pericardiocentesis.  We will hold antihypertensive hydrochlorothiazide lisinopril combo tablet. -Normal saline fluid bolus 1 L over 3 hours for hypotension  Hyponatremia: Patient fluid restricted restricted to 1500 mL's daily given the possibility of SIADH noted on her previous admission.  However, it appears that given  the patient may be experiencing pericarditis early on with possible appropriate antidiuretic hormone production given her developing effusion.  We  will continue to closely monitor her sodium and adjust fluids as necessary to maintain perfusion and balance her sodium. -Daily BMP  Hypokalemia: Resolved with p.o. Potassium prior day, 4.3 today  -Repeat BMP in a.m. and replete as indicated  GERD: -Continue omeprazole 5 mg daily  Diet: Heart Healthy Code: FUll Fluids: Bolus as indicate for hypotension, cautiously with heart failure history GI PPX: PPI DVT PPX: SCD's Dispo: Anticipated discharge in approximately 3 day(s).   Kathi Ludwig, MD 11/14/2017, 6:44 AM Pager: Pager# [phone]

## 2017-11-14 NOTE — Progress Notes (Signed)
Visited with patient who would like communion on tomorrow when the [employer] come around.  Prayed with patient and enjoyed laughing with her.  Pleasant visit.    11/14/17 1157  Clinical Encounter Type  Visited With Patient;Family;Health care provider  Visit Type Initial;Spiritual support  Spiritual Encounters  Spiritual Needs Prayer

## 2017-11-15 LAB — CBC
HCT: 31.6 % — ABNORMAL LOW (ref 36.0–46.0)
HEMOGLOBIN: 10.2 g/dL — AB (ref 12.0–15.0)
MCH: 27.3 pg (ref 26.0–34.0)
MCHC: 32.3 g/dL (ref 30.0–36.0)
MCV: 84.7 fL (ref 78.0–100.0)
PLATELETS: 420 10*3/uL — AB (ref 150–400)
RBC: 3.73 MIL/uL — ABNORMAL LOW (ref 3.87–5.11)
RDW: 14.1 % (ref 11.5–15.5)
WBC: 11.6 10*3/uL — ABNORMAL HIGH (ref 4.0–10.5)

## 2017-11-15 LAB — BASIC METABOLIC PANEL
ANION GAP: 14 (ref 5–15)
BUN: 27 mg/dL — AB (ref 6–20)
CALCIUM: 8.8 mg/dL — AB (ref 8.9–10.3)
CO2: 25 mmol/L (ref 22–32)
Chloride: 91 mmol/L — ABNORMAL LOW (ref 101–111)
Creatinine, Ser: 1.03 mg/dL — ABNORMAL HIGH (ref 0.44–1.00)
GFR calc Af Amer: >60 mL/min (ref >60)
GFR, EST NON AFRICAN AMERICAN: 54 mL/min — AB (ref >60)
GLUCOSE: 103 mg/dL — AB (ref 65–99)
Potassium: 4 mmol/L (ref 3.5–5.1)
Sodium: 130 mmol/L — ABNORMAL LOW (ref 135–145)

## 2017-11-15 MED ORDER — RAMELTEON 8 MG PO TABS
8.0000 mg | ORAL_TABLET | Freq: Once | ORAL | Status: AC
  Administered 2017-11-15: 8 mg via ORAL
  Filled 2017-11-15: qty 1

## 2017-11-15 MED ORDER — FUROSEMIDE 10 MG/ML IJ SOLN
20.0000 mg | Freq: Once | INTRAMUSCULAR | Status: AC
  Administered 2017-11-15: 20 mg via INTRAVENOUS
  Filled 2017-11-15: qty 2

## 2017-11-15 NOTE — Progress Notes (Addendum)
Subjective: The patient was resting in her bed today off of CPAP upon entering the room. She attested to feeling much improved, denied SOB, nausea, vomiting, diarrhea, chest pain, abdominal pain. She is agreeable to initiating PT to maintain strength.  Objective:  Vital signs in last 24 hours: Vitals:   11/15/17 0345 11/15/17 0400 11/15/17 0500 11/15/17 0600  BP:  103/75 103/74 109/70  Pulse:      Resp:  17 14 12   Temp: 98.1 F (36.7 C)     TempSrc: Oral     SpO2:  100% 100% 100%  Weight:      Height:       ROS negative except as per HPI>  Physical exam: General: in no acute distress, conversant, appears comfortable Neuro: Alert and oriented x4, no acute focal deficits  Cardio: RRR, no murmur, rubs or gallops Pulm: Lung with improved bilateral basilar crackles GI: abdomin soft, nontender, pericardial drain site clean and nontender Musc: Bilateral lower extremities nontender, nonedemetous   Assessment/Plan:  Active Problems:   Pericardial effusion with cardiac tamponade   Essential hypertension, benign   Hyponatremia   Sleep apnea   Hypokalemia   Atrial fibrillation with RVR (HCC)   Shortness of breath  Assessment: Selena Bush is a 72 year old female with a past medical history notable for hypertension, HFpEF (EF 60-65%) HLD, DCIS status post resection and radiation, atrial fibrillation, OSA, and hypokalemia who presented with fatigue and shortness of breath times 1.5 days. She stated that following discharge on 11/10/2017 she began to feel significantly at first, but worsened rapidly shortly therafter. When she was no longer able to tolerate her dyspnea she presented to the ED where there was no clear evidence of volume overload at that time other than marked JVD. Echocardiogram on 11/05/2017 demonstrated the HFpEF but no notable pericardial inflammation or effusion. Repeat echocardiogram during this admission revealed a moderate sized circumferential pericardial  effusion with tamponade physiology as well as a small left-sided pleural effusion. Cardiology opted for pericardiocentesis due to the Tamponade physiology. At this time I feel that the patient's current symptoms are consistent with the pericardial tamponade and to a lesser degree the pleural effusion.  Patient attested to be a resolution of her dyspnea following the pericardiocentesis and continues to improve symptomatically. Cardiology felt that he presentation was most likely secondary to pericarditis and with the addition of Xarelto for PA-fib it resulted in a hemorrhagic pericardial effusion.  Plan: Subacute pericardial effusion w/ Tamponade: Medically improved.  Pericardial drain was removed this a.m. as per cardiology report in their note is that this is a discharge become minimal.  As above I feel this was due to probable pericarditis but are awaiting cytology and culture for definitive conclusion.  She can remains symptomatically improved.    PAF: Patient continues to experience periodic episodes of atrial fibrillation with spontaneous return to normal sinus rhythm.  Cardiology recommending discontinue Cardizem due to hypotension.  We appreciate cardiology's input with regard to anticoagulation. -Continue Toprol metoprolol 75 mg twice daily -Continue sotalol 120 mg every 12 hours -Hold Cardizem  History of hypertension: Patient continues to be mildly hypertensive at this time.  Will hold Cardizem and antihypertensive medications. Hold Lasix at this time.  Hyponatremia:  Patient was initially fluid restricted given the possibility of SIADH on her previous admission.  Sodium of 130 noted on 11/15/2017.  We will continue to monitor and adjust indicated.  Given a dose of furosemide by cardiology due to concern for bilateral crackles on  lung exam. -Daily BMP  Hypokalemia: Result p.o. potassium initial day. -Repeat BMP daily  GERD: Continue omeprazole 5 mg daily  Diet: Heart Healthy Code:  FUll Fluids: Bolus as indicate for hypotension, cautiously with heart failure history GI PPX: PPI DVT PPX: SCD's Dispo: Anticipated discharge in approximately 2-3 day(s).   Kathi Ludwig, MD 11/15/2017, 6:39 AM Pager: Pager# [phone]   Internal Medicine Attending:   I saw and examined the patient. I reviewed the resident's note and I agree with the resident's findings and plan as documented in the resident's note.  Lalla Brothers, MD

## 2017-11-15 NOTE — Evaluation (Addendum)
Physical Therapy Evaluation Patient Details Name: Selena Bush MRN: [id number] DOB: 02-29-1946 Today's Date: 11/15/2017   History of Present Illness  Pt is a 72 y.o. female admitted 11/12/17 with large pericardial effusion with tamponade; s/p pericardiocentesis 4/1. PMH includes HF, HTN, R breast CA, bilat TKA (2014, 2016). Of note, recent admission 3/23-3/29 for a-fib with RVR and hyponatremia.    Clinical Impression  Pt presents with an overall decrease in functional mobility secondary to above. PTA, pt indep and lives alone. Today, pt able to perform multiple bouts of sit<>stands and marching in place with RW and min guard for balance; required 3x seated rest breaks secondary to fatigue. Amb to chair with RW. Hope pt will progress well with increased mobility and be able to return home with HHPT services; discussed potential for continued rehab at [facility] therapies since pt will not have any support at home. Will follow acutely to address established goals.   SpO2 >90% on RA, HR 71-85  Supine BP 116/73 Seated BP 114/78 Standing BP 115/81 Post-amb BP 120/74    Follow Up Recommendations SNF;Supervision/Assistance - 24 hour(HHPT pending pt progression)    Equipment Recommendations  Rolling walker with 5" wheels    Recommendations for Other Services OT consult     Precautions / Restrictions Precautions Precautions: Fall Restrictions Weight Bearing Restrictions: No      Mobility  Bed Mobility Overal bed mobility: Needs Assistance Bed Mobility: Supine to Sit     Supine to sit: Supervision     General bed mobility comments: Increased time and effort  Transfers Overall transfer level: Needs assistance Equipment used: None;Rolling walker (2 wheeled) Transfers: Sit to/from Stand Sit to Stand: Min assist;Min guard         General transfer comment: Initial standing with min guard, reaching out for HHA requiring minA to maintain balance. Stood 4x more with RW and min  guard for balance; cues for correct hand placement  Ambulation/Gait Ambulation/Gait assistance: Min guard   Assistive device: Rolling walker (2 wheeled) Gait Pattern/deviations: Step-to pattern;Trunk flexed Gait velocity: Decreased Gait velocity interpretation: Below normal speed for age/gender General Gait Details: Pt able to march in place with RW and min guard for balance. Performed 4x reps, including taking side steps to [facility], with seated rest in between secondary to fatigue. Amb 5' to chair with RW  Stairs            Wheelchair Mobility    Modified Rankin (Stroke Patients Only)       Balance Overall balance assessment: Needs assistance Sitting-balance support: No upper extremity supported;Feet supported Sitting balance-Leahy Scale: Fair     Standing balance support: No upper extremity supported;During functional activity Standing balance-Leahy Scale: Fair Standing balance comment: can stand statically without support                              Pertinent Vitals/Pain Pain Assessment: No/denies pain    Home Living Family/patient expects to be discharged to:: Private residence Living Arrangements: Alone Available Help at Discharge: Family;Available PRN/intermittently(son lives hour away) Type of Home: House Home Access: Level entry     Home Layout: One level Home Equipment: Shower seat      Prior Function Level of Independence: Independent               Hand Dominance        Extremity/Trunk Assessment   Upper Extremity Assessment Upper Extremity Assessment: Overall WFL for tasks  assessed    Lower Extremity Assessment Lower Extremity Assessment: Generalized weakness       Communication   Communication: No difficulties  Cognition Arousal/Alertness: Awake/alert Behavior During Therapy: WFL for tasks assessed/performed Overall Cognitive Status: Within Functional Limits for tasks assessed                                         General Comments      Exercises     Assessment/Plan    PT Assessment Patient needs continued PT services  PT Problem List Decreased strength;Decreased activity tolerance;Decreased balance;Decreased mobility;Decreased knowledge of use of DME       PT Treatment Interventions DME instruction;Gait training;Stair training;Functional mobility training;Therapeutic activities;Therapeutic exercise;Balance training;Patient/family education    PT Goals (Current goals can be found in the Care Plan section)  Acute Rehab PT Goals Patient Stated Goal: Return home PT Goal Formulation: With patient Time For Goal Achievement: 11/29/17 Potential to Achieve Goals: Good    Frequency Min 3X/week   Barriers to discharge Decreased caregiver support      Co-evaluation               AM-PAC PT "6 Clicks" Daily Activity  Outcome Measure Difficulty turning over in bed (including adjusting bedclothes, sheets and blankets)?: A Little Difficulty moving from lying on back to sitting on the side of the bed? : A Little Difficulty sitting down on and standing up from a chair with arms (e.g., wheelchair, bedside commode, etc,.)?: A Little Help needed moving to and from a bed to chair (including a wheelchair)?: A Little Help needed walking in hospital room?: A Little Help needed climbing 3-5 steps with a railing? : A Little 6 Click Score: 18    End of Session Equipment Utilized During Treatment: Gait belt Activity Tolerance: Patient tolerated treatment well;Patient limited by fatigue Patient left: in chair;with call bell/phone within reach;with nursing/sitter in room Nurse Communication: Mobility status PT Visit Diagnosis: Other abnormalities of gait and mobility (R26.89);Muscle weakness (generalized) (M62.81)    Time: 1534-1610 PT Time Calculation (min) (ACUTE ONLY): 36 min   Charges:   PT Evaluation $PT Eval Moderate Complexity: 1 Mod PT Treatments $Therapeutic Activity:  8-22 mins   PT G Codes:       Mabeline Caras, PT, DPT Acute Rehab Services  Pager: [place] 11/15/2017, 4:31 PM

## 2017-11-15 NOTE — Progress Notes (Signed)
Progress Note  Patient Name: Selena Bush Date of Encounter: 11/15/2017  Primary Cardiologist: Dr Rockey Situ  Subjective   No CP; mild dyspnea  Inpatient Medications    Scheduled Meds: . diltiazem  60 mg Oral Q8H  . DULoxetine  120 mg Oral Daily  . metoprolol tartrate  75 mg Oral BID  . ramelteon  8 mg Oral QHS  . sertraline  50 mg Oral Daily  . sotalol  120 mg Oral Q12H   Continuous Infusions: . sodium chloride Stopped (11/14/17 2000)   PRN Meds: acetaminophen   Vital Signs    Vitals:   11/15/17 0500 11/15/17 0600 11/15/17 0700 11/15/17 0730  BP: 103/74 109/70 113/75   Pulse:      Resp: 14 12 11 11   Temp:      TempSrc:      SpO2: 100% 100% 100% 100%  Weight:      Height:        Intake/Output Summary (Last 24 hours) at 11/15/2017 0733 Last data filed at 11/15/2017 0600 Gross per 24 hour  Intake 1729.9 ml  Output 1740 ml  Net -10.1 ml   Filed Weights   11/12/17 1150 11/12/17 1842 11/13/17 0538  Weight: 227 lb 1.2 oz (103 kg) 219 lb 4.8 oz (99.5 kg) 218 lb 4.8 oz (99 kg)    Telemetry    Sinus with PAF- Personally Reviewed   Physical Exam   GEN: WD obese NAD Neck: supple Cardiac: irregular Respiratory: Decreased BS bases GI: Soft, NT/ND, pericardial drain in place MS: No edema Neuro:  Grossly intact   Labs    Chemistry Recent Labs  Lab 11/12/17 1216 11/13/17 0212 11/14/17 0242 11/15/17 0333  NA 127* 129* 131* 130*  K 3.7 3.4* 4.3 4.0  CL 92* 90* 93* 91*  CO2 24 25 26 25   GLUCOSE 136* 128* 118* 103*  BUN 29* 29* 33* 27*  CREATININE 1.19* 1.41* 1.22* 1.03*  CALCIUM 8.9 9.1 9.1 8.8*  PROT 6.4*  --   --   --   ALBUMIN 3.0*  --   --   --   AST 22  --   --   --   ALT 18  --   --   --   ALKPHOS 81  --   --   --   BILITOT 0.8  --   --   --   GFRNONAA 45* 37* 44* 54*  GFRAA 53* 43* 51* >60  ANIONGAP 11 14 12 14      Hematology Recent Labs  Lab 11/12/17 1216 11/14/17 0242 11/15/17 0333  WBC 10.4 10.3 11.6*  RBC 3.96 4.17 3.73*    HGB 10.4* 11.0* 10.2*  HCT 32.7* 35.2* 31.6*  MCV 82.6 84.4 84.7  MCH 26.3 26.4 27.3  MCHC 31.8 31.3 32.3  RDW 13.7 13.6 14.1  PLT 403* 362 420*    Recent Labs  Lab 11/12/17 1534 11/12/17 1811  TROPIPOC 0.00 0.00     BNP Recent Labs  Lab 11/12/17 1216  BNP 395.7*     DDimer  Recent Labs  Lab 11/12/17 1812  DDIMER 3.00*     Radiology    Dg Chest Port 1 View  Result Date: 11/13/2017 CLINICAL DATA:  Status post pericardial drain placement EXAM: PORTABLE CHEST 1 VIEW COMPARISON:  11/12/2017, 11/04/2017 FINDINGS: A drainage catheter overlies the left lower chest. Cardiomegaly. Subsegmental atelectasis at the right base. Increased airspace disease at the left lung base. No pneumothorax. IMPRESSION: 1. Placement of drainage  catheter over the left lower chest 2. Increased airspace disease at the left base may reflect increasing atelectasis or infiltrate. Possible small left effusion. 3. Subsegmental atelectasis at the right base 4. Cardiomegaly Electronically Signed   By: Donavan Foil M.D.   On: 11/13/2017 20:20    Patient Profile     72 year old female with past medical history of diastolic congestive heart failure, hypertension, hyperlipidemia, obstructive sleep apnea, breast cancer and recent diagnosis of atrial fibrillation with pericardial effusion with tamponade physiology.  Patient discharged March 29 following admission for atypical chest pain and paroxysmal atrial fibrillation.  Echocardiogram showed normal LV function and trivial pericardial effusion.  She was treated with sotalol and Xarelto.  Saturday, March 30 she noticed onset of dyspnea which slowly worsened. FU echocardiogram showed large pericardial effusion with respiratory variation and dilated IVC concerning for tamponade physiology. Had pericardiocentesis 4/1.    Assessment & Plan    1 large pericardial effusion with tamponade physiology on echocardiogram-s/p pericardiocentesis; pericardial drain with 15 cc  out over preceding 12 hrs; echo yesterday showed no pericardial effusion; drain removed this AM. I think the most likely unifying diagnosis is recent pericarditis; xarelto then initiated for PAF resulting in hemorrhagic pericardial effusion.  We will await cultures and cytology of pericardial fluid.    2 paroxysmal atrial fibrillation-patient continues to have episodes of paroxysmal atrial fibrillation.  This is likely related to pericarditis/pericardial effusion. BP borderline. We will continue metoprolol for rate control as tolerated by blood pressure; hold cardizem.  Continue sotalol.  I am hopeful that paroxysmal atrial fibrillation will improve as pericardial process resolves.  We will not anticoagulate given recent hemorrhagic pericardial effusion.    3 hypertension-blood pressure is borderline.  Continue off lisinopril HCT as well as Lasix.  DC cardizem; continue metoprolol for rate control as tolerated.  Follow blood pressure and adjust regimen as needed.   For questions or updates, please contact [place] Please consult [email or web address] for contact info under Cardiology/STEMI.      Signed, [name], MD  11/15/2017, 7:33 AM

## 2017-11-16 ENCOUNTER — Inpatient Hospital Stay (HOSPITAL_COMMUNITY): Payer: PPO

## 2017-11-16 DIAGNOSIS — F330 Major depressive disorder, recurrent, mild: Secondary | ICD-10-CM

## 2017-11-16 LAB — BASIC METABOLIC PANEL
Anion gap: 10 (ref 5–15)
BUN: 21 mg/dL — ABNORMAL HIGH (ref 6–20)
CHLORIDE: 94 mmol/L — AB (ref 101–111)
CO2: 26 mmol/L (ref 22–32)
CREATININE: 0.84 mg/dL (ref 0.44–1.00)
Calcium: 8.7 mg/dL — ABNORMAL LOW (ref 8.9–10.3)
GFR calc Af Amer: >60 mL/min (ref >60)
GFR calc non Af Amer: >60 mL/min (ref >60)
GLUCOSE: 138 mg/dL — AB (ref 65–99)
Potassium: 3.7 mmol/L (ref 3.5–5.1)
Sodium: 130 mmol/L — ABNORMAL LOW (ref 135–145)

## 2017-11-16 MED ORDER — ALUM & MAG HYDROXIDE-SIMETH 200-200-20 MG/5ML PO SUSP
15.0000 mL | ORAL | Status: DC | PRN
  Administered 2017-11-16 – 2017-11-19 (×3): 15 mL via ORAL
  Filled 2017-11-16 (×3): qty 30

## 2017-11-16 MED ORDER — POTASSIUM CHLORIDE CRYS ER 20 MEQ PO TBCR
40.0000 meq | EXTENDED_RELEASE_TABLET | Freq: Once | ORAL | Status: AC
  Administered 2017-11-16: 40 meq via ORAL
  Filled 2017-11-16: qty 2

## 2017-11-16 MED ORDER — DILTIAZEM HCL 60 MG PO TABS
30.0000 mg | ORAL_TABLET | Freq: Four times a day (QID) | ORAL | Status: DC
  Administered 2017-11-16 – 2017-11-17 (×5): 30 mg via ORAL
  Filled 2017-11-16 (×5): qty 1

## 2017-11-16 MED ORDER — FUROSEMIDE 20 MG PO TABS
20.0000 mg | ORAL_TABLET | Freq: Every day | ORAL | Status: DC
  Administered 2017-11-16 – 2017-11-18 (×3): 20 mg via ORAL
  Filled 2017-11-16 (×3): qty 1

## 2017-11-16 NOTE — Progress Notes (Signed)
   I saw and examined the patient. I reviewed the resident's note and I agree with the resident's findings and plan as documented in the resident's note.  Patient is progressing nicely, there have been no signs of re-accumulation of pericardial effusion. We made a few decisions today, we should try to get her mobilized now, move out of 2H to a regular floor, PT and OT consults, and start looking for SNF placement as an option for tomorrow. We decided to not restart raloxifene right now, for history of hormone positive DCIS. I think this carries some risk of clot, and she has a fib that cannot be anticoagulated. I think in a few months this can be re-addressed with her outpatient providers, if the afib is resolved it would be reasonable to resume the SERM then. Thirdly we decided to stop Sertraline to try to improve the hyponatremia which is likely SIADH. She can continue Cymbalta for her mild depression.   Axel Filler, MD 11/16/2017, 11:38 AM

## 2017-11-16 NOTE — Progress Notes (Signed)
Physical Therapy Treatment Patient Details Name: Selena Bush MRN: [id number] DOB: August 11, 1946 Today's Date: 11/16/2017    History of Present Illness Pt is a 72 y.o. female admitted 11/12/17 with large pericardial effusion with tamponade; s/p pericardiocentesis 4/1. PMH includes HF, HTN, R breast CA, bilat TKA (2014, 2016). Of note, recent admission 3/23-3/29 for a-fib with RVR and hyponatremia.   PT Comments    Pt slowly progressing with mobility. Recommended use of RW for energy conservation and stability, as pt requiring HHA with in-room ambulation. Able to amb 100' with RW and min guard for balance, requiring 4x seated rests breaks secondary to fatigue. At one point pt stating, "I feel like I'm hyperventilating" with respiration rate up to 40. Educ on deep breathing technique. Other than this, VSS.  Discussed recommendation for SNF-level therapies as pt not currently safe to return home; has no support available at home. Pt in agreement with SNF rec.    Follow Up Recommendations  SNF;Supervision/Assistance - 24 hour     Equipment Recommendations  Rolling walker with 5" wheels    Recommendations for Other Services OT consult     Precautions / Restrictions Precautions Precautions: Fall Precaution Comments: Watch respiration rate Restrictions Weight Bearing Restrictions: No    Mobility  Bed Mobility               General bed mobility comments: Received sitting in recliner  Transfers Overall transfer level: Needs assistance Equipment used: Rolling walker (2 wheeled) Transfers: Sit to/from Stand Sit to Stand: Min guard;Min assist         General transfer comment: Initial minA standing from recliner with RW. Stood 4x more from chair with RW and close min guard for balance. Repeated cues for hand placement  Ambulation/Gait Ambulation/Gait assistance: Min guard Ambulation Distance (Feet): 100 Feet Assistive device: Rolling walker (2 wheeled) Gait  Pattern/deviations: Step-through pattern;Decreased stride length;Trunk flexed Gait velocity: Decreased Gait velocity interpretation: Below normal speed for age/gender General Gait Details: Slowed amb with RW and min guard for balance. Required 4x seated rest breaks secondary to fatigue; 1x pt reporting "I feel like I'm going to hyperventilate" with RR up to 40. Other than increased respiration rate, VSS   Stairs            Wheelchair Mobility    Modified Rankin (Stroke Patients Only)       Balance Overall balance assessment: Needs assistance Sitting-balance support: No upper extremity supported;Feet supported Sitting balance-Leahy Scale: Fair     Standing balance support: No upper extremity supported;During functional activity Standing balance-Leahy Scale: Fair Standing balance comment: can stand statically without support                             Cognition Arousal/Alertness: Awake/alert Behavior During Therapy: Flat affect Overall Cognitive Status: Within Functional Limits for tasks assessed                                        Exercises      General Comments        Pertinent Vitals/Pain Pain Assessment: No/denies pain    Home Living                      Prior Function            PT Goals (current goals can now be  found in the care plan section) Acute Rehab PT Goals Patient Stated Goal: Return home PT Goal Formulation: With patient Time For Goal Achievement: 11/29/17 Potential to Achieve Goals: Good Progress towards PT goals: Progressing toward goals    Frequency    Min 3X/week      PT Plan Discharge plan needs to be updated    Co-evaluation              AM-PAC PT "6 Clicks" Daily Activity  Outcome Measure  Difficulty turning over in bed (including adjusting bedclothes, sheets and blankets)?: A Little Difficulty moving from lying on back to sitting on the side of the bed? : A  Little Difficulty sitting down on and standing up from a chair with arms (e.g., wheelchair, bedside commode, etc,.)?: A Little Help needed moving to and from a bed to chair (including a wheelchair)?: A Little Help needed walking in hospital room?: A Little Help needed climbing 3-5 steps with a railing? : A Lot 6 Click Score: 17    End of Session Equipment Utilized During Treatment: Gait belt Activity Tolerance: Patient tolerated treatment well;Patient limited by fatigue Patient left: in chair;with call bell/phone within reach Nurse Communication: Mobility status PT Visit Diagnosis: Other abnormalities of gait and mobility (R26.89);Muscle weakness (generalized) (M62.81)     Time: 9518-8416 PT Time Calculation (min) (ACUTE ONLY): 30 min  Charges:  $Gait Training: 8-22 mins $Therapeutic Activity: 8-22 mins                    G Codes:      Mabeline Caras, PT, DPT Acute Rehab Services  Pager: [place] 11/16/2017, 12:06 PM

## 2017-11-16 NOTE — Progress Notes (Signed)
Progress Note  Patient Name: Selena Bush Date of Encounter: 11/16/2017  Primary Cardiologist: Dr Rockey Situ  Subjective   Coughing last pm; denies dyspnea or CP this AM.  Inpatient Medications    Scheduled Meds: . DULoxetine  120 mg Oral Daily  . metoprolol tartrate  75 mg Oral BID  . ramelteon  8 mg Oral QHS  . sertraline  50 mg Oral Daily  . sotalol  120 mg Oral Q12H   Continuous Infusions: . sodium chloride Stopped (11/14/17 2000)   PRN Meds: acetaminophen, alum & mag hydroxide-simeth   Vital Signs    Vitals:   11/16/17 0339 11/16/17 0400 11/16/17 0533 11/16/17 0600  BP:  127/82 (!) 141/80 131/81  Pulse:      Resp:  12 (!) 27 19  Temp: 97.6 F (36.4 C)     TempSrc: Oral     SpO2:  98% 97% 96%  Weight:      Height:        Intake/Output Summary (Last 24 hours) at 11/16/2017 0723 Last data filed at 11/16/2017 0500 Gross per 24 hour  Intake 350 ml  Output 1400 ml  Net -1050 ml   Filed Weights   11/12/17 1150 11/12/17 1842 11/13/17 0538  Weight: 227 lb 1.2 oz (103 kg) 219 lb 4.8 oz (99.5 kg) 218 lb 4.8 oz (99 kg)    Telemetry    Sinus Personally Reviewed   Physical Exam   GEN: WD obese no acute distress Neck: supple, no JVD Cardiac: RRR Respiratory: Minimal basilar crackles GI: Soft, NT/ND, pericardiocentesis site without drainage MS: No edema Neuro:  No focal findings   Labs    Chemistry Recent Labs  Lab 11/12/17 1216  11/14/17 0242 11/15/17 0333 11/16/17 0414  NA 127*   < > 131* 130* 130*  K 3.7   < > 4.3 4.0 3.7  CL 92*   < > 93* 91* 94*  CO2 24   < > 26 25 26   GLUCOSE 136*   < > 118* 103* 138*  BUN 29*   < > 33* 27* 21*  CREATININE 1.19*   < > 1.22* 1.03* 0.84  CALCIUM 8.9   < > 9.1 8.8* 8.7*  PROT 6.4*  --   --   --   --   ALBUMIN 3.0*  --   --   --   --   AST 22  --   --   --   --   ALT 18  --   --   --   --   ALKPHOS 81  --   --   --   --   BILITOT 0.8  --   --   --   --   GFRNONAA 45*   < > 44* 54* >60  GFRAA 53*   < >  51* >60 >60  ANIONGAP 11   < > 12 14 10    < > = values in this interval not displayed.     Hematology Recent Labs  Lab 11/12/17 1216 11/14/17 0242 11/15/17 0333  WBC 10.4 10.3 11.6*  RBC 3.96 4.17 3.73*  HGB 10.4* 11.0* 10.2*  HCT 32.7* 35.2* 31.6*  MCV 82.6 84.4 84.7  MCH 26.3 26.4 27.3  MCHC 31.8 31.3 32.3  RDW 13.7 13.6 14.1  PLT 403* 362 420*    Recent Labs  Lab 11/12/17 1534 11/12/17 1811  TROPIPOC 0.00 0.00     BNP Recent Labs  Lab 11/12/17 1216  BNP 395.7*     DDimer  Recent Labs  Lab 11/12/17 1812  DDIMER 3.00*     Patient Profile     72 year old female with past medical history of diastolic congestive heart failure, hypertension, hyperlipidemia, obstructive sleep apnea, breast cancer and recent diagnosis of atrial fibrillation with pericardial effusion with tamponade physiology.  Patient discharged March 29 following admission for atypical chest pain and paroxysmal atrial fibrillation.  Echocardiogram showed normal LV function and trivial pericardial effusion.  She was treated with sotalol and Xarelto.  Saturday, March 30 she noticed onset of dyspnea which slowly worsened. FU echocardiogram showed large pericardial effusion with respiratory variation and dilated IVC concerning for tamponade physiology. Had pericardiocentesis 4/1.    Assessment & Plan    1 large pericardial effusion with tamponade physiology s/p pericardiocentesis. Pt doing well this AM; pericardial fluid cx negative to date and cytology without malignancy. I think the most likely unifying diagnosis is recent pericarditis; xarelto then initiated for PAF resulting in hemorrhagic pericardial effusion. Will plan repeat limited echo in AM to make sure effusion not reacumulating.  2 paroxysmal atrial fibrillation-patient in sinus this AM.We will continue metoprolol at present dose; add cardizem 30 mg every 6 hours and transition to CD in AM if BP stable.  Continue sotalol.  I am hopeful that  paroxysmal atrial fibrillation will improve as pericardial process resolves.  We will not anticoagulate given recent hemorrhagic pericardial effusion.    3 hypertension-blood pressure is much improved; resume cardizem as outlined; add lasix 20 mg daily.  Ambulate today; DC in AM if pericardial effusion and BP stable.   For questions or updates, please contact [facility] Please consult [email or web address] for contact info under Cardiology/STEMI.      Signed, [name], MD  11/16/2017, 7:23 AM

## 2017-11-16 NOTE — Progress Notes (Signed)
Subjective: The patient was lying comfortably in her bed today upon entering the room.  She continues to deny chest pain, shortness of breath, abdominal pain, nausea, diaphoresis, fever or chills.  She would prefer to continue rehabilitation as an inpatient in this hospital rather than being transferred to a short-term skilled nursing facility prior to be discharged home.  We discussed briefly with her that we would attempt to continue titrating her medications as necessary to treat her other medical conditions and reassess when she was amenable for discharge based on those results at a later date.  Objective:  Vital signs in last 24 hours: Vitals:   11/16/17 0339 11/16/17 0400 11/16/17 0533 11/16/17 0600  BP:  127/82 (!) 141/80 131/81  Pulse:      Resp:  12 (!) 27 19  Temp: 97.6 F (36.4 C)     TempSrc: Oral     SpO2:  98% 97% 96%  Weight:      Height:       ROS negative except as per HPI  Physical exam: General: No acute distress, conversant, afebrile, nondiaphoretic Neuro: Alert and oriented x4, no focal deficits observed Cardiovascular: RRR, no murmur rubs or gallops auscultated Pulmonary: Patient has persistent mild crackles in the bilateral lower lung fields which are subjectively improved from the prior day with no wheezing GI: Abdomen is soft, nontender, nondistended, pericardial drain site is clear and clean without edema or erythema Muscular skeletal: Bilateral lower extremities are nonedematous, nontender Psych: Mood is appropriate as is affect  Assessment/Plan:  Active Problems:   Pericardial effusion with cardiac tamponade   Essential hypertension, benign   Hyponatremia   Sleep apnea   Hypokalemia   Atrial fibrillation with RVR (HCC)   Shortness of breath   Assessment: Selena Bush is a 72 year old female with a past medical history notable for hypertension, HFpEF (EF 60-65%) HLD, DCIS status post resection and radiation, atrial fibrillation, OSA, and  hypokalemia who presented with fatigue and shortness of breath times 1.5 days. She stated that following discharge on 11/10/2017 she began to feel significantly at first, but worsened rapidly shortly therafter. When she was no longer able to tolerate her dyspnea she presented to the ED where there was no clear evidence of volume overload at that time other than marked JVD. Echocardiogram on 11/05/2017 had demonstrated the HFpEF but no notable pericardial inflammation or effusion. Repeat echocardiogram during this admission revealed a moderately sized circumferential pericardial effusion with tamponade physiology as well as a small left-sided pleural effusion. Cardiology opted for pericardiocentesis due to the Tamponade physiology.Patient attested to the resolution of her dyspnea following the pericardiocentesis and continues to improve symptomatically with each day. Cardiology felt that her presentation was most likely secondary to pericarditis and with the addition of Xarelto for PA-fib she subsequently developed a hemorrhagic pericardial effusion. Her Xarelto was discontinued due to the effusion and rate control was optimized.   Plan: Subacute pericardial effusion with tamponade: Significantly improved, she continues to be asymptomatic with regard to her effusion. Patient continues to improve daily repeat echocardiogram is ordered for the morning of 11/17/2017 to evaluate for pericardial effusion.  Given significant improvement in her symptoms she appears to be stable to transfer to telemetry bed. -Echocardiogram in a.m.  Paroxysmal atrial fibrillation: Patient continues to experience periodic episodes of atrial fibrillation which occurred again accompanied by RVR between 9 AM and 10 AM on 11/16/2017.  She was placed back on a Cardizem as per cardiology and was in normal sinus  rhythm during the evaluation today.  We will continue to monitor this and appreciate cardiology's continued assistance with this  matter. -Continue metoprolol 75 mg twice daily -Continue sotalol 120 mg every 12 hours -Continue Cardizem 30 mg every 6 hours - Patient transferred to telemetry  Blood pressure: Patient's pressure has significantly increased and improved since admission.  She was placed back on Cardizem today. We will continue to adjust her medications as indicated based upon the effect her current medications demonstrate. -Continue furosemide 20 mg daily her home dose  Hyponatremia: This is occurred since September 22, 2017.  There is concern that this may indeed be SIADH secondary to the patient's SSRI (sertraline) based on her response to fluids and/or fluid restriction during this admission.  As such we discussed with the patient the need to hold her sertraline at this time given her dual therapy with duloxetine as well.  She is reluctant but is in agreement to temporarily hold the medication and observe for improvement. It was initially felt that her hyponatremia may have been the result of appropriate ADH release due to her tamponade. Most recent sodium 130 -Daily BMP -Discontinue sertraline - Continue fluid restriction  Hypokalemia: Most recent potassium 3.7, will ensure repletion with PO potassium  GERD: Continue omeprazole on discharge  Diet: Heart Healthy Code: Full Fluids:N/a GI PPX: PPI DVT PPX: SCD's Dispo: Anticipated discharge in approximately 1-2 day(s).   Kathi Ludwig, MD 11/16/2017, 6:50 AM Pager: Pager# [phone]

## 2017-11-16 NOTE — Evaluation (Signed)
Occupational Therapy Evaluation Patient Details Name: Selena Bush MRN: [id number] DOB: 03-Aug-1946 Today's Date: 11/16/2017    History of Present Illness Pt is a 72 y.o. female admitted 11/12/17 with large pericardial effusion with tamponade; s/p pericardiocentesis 4/1. PMH includes HF, HTN, R breast CA, bilat TKA (2014, 2016). Of note, recent admission 3/23-3/29 for a-fib with RVR and hyponatremia.   Clinical Impression   PTA Pt independent in ADL and mobility. Pt is currently supervision for transfers and min guard for standing ADL, mod A for LB ADL decreased activity tolerance. Pt will benefit from skilled OT in the acute setting and afterwards at the SNF level to maximize safety and independence in ADL and functional transfers. Next session to focus on energy conservation education and standing tolerance at sink.    Follow Up Recommendations  SNF    Equipment Recommendations  Other (comment)(defer to next venue)    Recommendations for Other Services       Precautions / Restrictions Precautions Precautions: Fall Precaution Comments: Watch respiration rate Restrictions Weight Bearing Restrictions: No      Mobility Bed Mobility Overal bed mobility: Needs Assistance Bed Mobility: Supine to Sit     Supine to sit: Supervision     General bed mobility comments: supervision for line management  Transfers Overall transfer level: Needs assistance Equipment used: 1 person hand held assist Transfers: Sit to/from Stand;Stand Pivot Transfers Sit to Stand: Min assist Stand pivot transfers: Min assist       General transfer comment: min A for steady and balance    Balance Overall balance assessment: Needs assistance Sitting-balance support: No upper extremity supported;Feet supported Sitting balance-Leahy Scale: Fair     Standing balance support: No upper extremity supported;During functional activity Standing balance-Leahy Scale: Fair Standing balance comment: can  stand statically without support                            ADL either performed or assessed with clinical judgement   ADL Overall ADL's : Needs assistance/impaired Eating/Feeding: Modified independent   Grooming: Min guard;Standing   Upper Body Bathing: Minimal assistance   Lower Body Bathing: Moderate assistance;Sitting/lateral leans   Upper Body Dressing : Minimal assistance   Lower Body Dressing: Moderate assistance   Toilet Transfer: Minimal assistance;Ambulation;RW   Toileting- Clothing Manipulation and Hygiene: Moderate assistance       Functional mobility during ADLs: Minimal assistance;Rolling walker(HHA this session, but per PT requires RW for safety)       Vision Patient Visual Report: No change from baseline       Perception     Praxis      Pertinent Vitals/Pain Pain Assessment: No/denies pain     Hand Dominance Right   Extremity/Trunk Assessment Upper Extremity Assessment Upper Extremity Assessment: Overall WFL for tasks assessed   Lower Extremity Assessment Lower Extremity Assessment: Generalized weakness       Communication Communication Communication: No difficulties   Cognition Arousal/Alertness: Awake/alert Behavior During Therapy: WFL for tasks assessed/performed Overall Cognitive Status: Within Functional Limits for tasks assessed                                     General Comments  Pt's son present throughout the session    Exercises     Shoulder Instructions      Home Living Family/patient expects to be discharged to:: Private residence  Living Arrangements: Alone Available Help at Discharge: Family;Available PRN/intermittently(Son lives an hour away) Type of Home: House Home Access: Level entry Entrance Stairs-Number of Steps: 3-4 Entrance Stairs-Rails: Right Home Layout: One level     Bathroom Shower/Tub: Occupational psychologist: Handicapped height     Home Equipment: Shower  seat          Prior Functioning/Environment Level of Independence: Independent                 OT Problem List: Decreased activity tolerance;Decreased strength;Impaired balance (sitting and/or standing);Decreased safety awareness;Decreased knowledge of use of DME or AE;Cardiopulmonary status limiting activity      OT Treatment/Interventions: Self-care/ADL training;Energy conservation;DME and/or AE instruction;Therapeutic activities;Patient/family education;Balance training    OT Goals(Current goals can be found in the care plan section) Acute Rehab OT Goals Patient Stated Goal: Return home OT Goal Formulation: With patient/family Time For Goal Achievement: 11/30/17 Potential to Achieve Goals: Good ADL Goals Pt Will Perform Grooming: with modified independence;standing Pt Will Perform Upper Body Bathing: with modified independence;standing Pt Will Perform Lower Body Bathing: with modified independence;sit to/from stand Pt Will Transfer to Toilet: with modified independence;ambulating Pt Will Perform Toileting - Clothing Manipulation and hygiene: with modified independence;sit to/from stand Additional ADL Goal #1: Pt will recall 3 ways of conserving energy at mod I level for ADL  OT Frequency: Min 2X/week   Barriers to D/C: Decreased caregiver support  Pt lives alone       Co-evaluation              AM-PAC PT "6 Clicks" Daily Activity     Outcome Measure Help from another person eating meals?: None Help from another person taking care of personal grooming?: A Little Help from another person toileting, which includes using toliet, bedpan, or urinal?: A Little Help from another person bathing (including washing, rinsing, drying)?: A Little Help from another person to put on and taking off regular upper body clothing?: None Help from another person to put on and taking off regular lower body clothing?: A Lot 6 Click Score: 19   End of Session Equipment Utilized  During Treatment: Oxygen(1L) Nurse Communication: Mobility status  Activity Tolerance: Patient tolerated treatment well Patient left: Other (comment)(in WC transferring to 4th floor)  OT Visit Diagnosis: Unsteadiness on feet (R26.81);Other abnormalities of gait and mobility (R26.89);Muscle weakness (generalized) (M62.81)                Time: 0932-3557 OT Time Calculation (min): 16 min Charges:  OT General Charges $OT Visit: 1 Visit OT Evaluation $OT Eval Moderate Complexity: 1 Mod G-Codes:     Hulda Humphrey OTR/L [place] 11/16/2017, 5:51 PM

## 2017-11-16 NOTE — NC FL2 (Signed)
[place] LEVEL OF CARE SCREENING TOOL     IDENTIFICATION  Patient Name: Selena Bush Birthdate: [birth year] Sex: female Admission Date (Current Location): 11/12/2017  [facility] and [place] Number:  Herbalist and Address:  [facility] [place], [place] [address] [place], [place] [address]      Provider Number: [id number]  Attending Physician Name and Address:  Axel Filler, *  Relative Name and Phone Number:  [name], son, [phone]    Current Level of Care: Hospital Recommended Level of Care: [facility] Prior Approval Number:    Date Approved/Denied:   PASRR Number: [id number]  Discharge Plan: SNF    Current Diagnoses: Patient Active Problem List   Diagnosis Date Noted  . Pericardial effusion with cardiac tamponade 11/13/2017  . Shortness of breath 11/12/2017  . Atrial fibrillation with RVR ([place]) 11/05/2017  . Atypical chest pain 11/04/2017  . Anxiety 03/21/2016  . Insomnia 07/17/2015  . Hearing loss 03/09/2015  . Hypokalemia 01/21/2015  . MI (mitral incompetence) 12/10/2014  . Depression 12/09/2014  . Sleep apnea 12/09/2014  . GERD (gastroesophageal reflux disease) 12/09/2014  . Hyperglycemia 12/09/2014  . Chronic diastolic CHF (congestive heart failure) ([place])   . HLD (hyperlipidemia)   . Osteoarthritis of both knees   . S/P left TKA 08/19/2014  . Morbid obesity ([place]) 04/17/2013  . Hyponatremia 04/17/2013  . DCIS (ductal carcinoma in situ) 11/05/2012  . Essential hypertension, benign 11/05/2012    Orientation RESPIRATION BLADDER Height & Weight     Self, Time, Situation, Place  Normal Continent Weight: 99 kg (218 lb 4.8 oz)(c scale) Height:  5\' 2"  (157.5 cm)  BEHAVIORAL SYMPTOMS/MOOD NEUROLOGICAL BOWEL NUTRITION STATUS      Continent Diet(Please see DC Summary)  AMBULATORY STATUS COMMUNICATION OF NEEDS Skin   Limited Assist Verbally Normal                       Personal Care  Assistance Level of Assistance  Bathing, Feeding, Dressing Bathing Assistance: Limited assistance Feeding assistance: Independent Dressing Assistance: Limited assistance     Functional Limitations Info             SPECIAL CARE FACTORS FREQUENCY  PT (By licensed PT), OT (By licensed OT)     PT Frequency: 5x/week OT Frequency: 3x/week            Contractures      Additional Factors Info  Code Status, Allergies, Psychotropic Code Status Info: Full Allergies Info: Penicillins, Sulfa Antibiotics, Asa Aspirin, Codeine, Statins Psychotropic Info: Cymbalta         Current Medications (11/16/2017):  This is the current hospital active medication list Current Facility-Administered Medications  Medication Dose Route Frequency Provider Last Rate Last Dose  . 0.9 %  sodium chloride infusion   Intravenous Continuous End, Harrell Gave, MD   Stopped at 11/14/17 2000  . acetaminophen (TYLENOL) tablet 650 mg  650 mg Oral Q6H PRN Axel Filler, MD   650 mg at 11/16/17 0851  . alum & mag hydroxide-simeth (MAALOX/MYLANTA) 200-200-20 MG/5ML suspension 15 mL  15 mL Oral Q4H PRN Damian Leavell, RN   15 mL at 11/16/17 0532  . diltiazem (CARDIZEM) tablet 30 mg  30 mg Oral Q6H Lelon Perla, MD   30 mg at 11/16/17 1146  . DULoxetine (CYMBALTA) DR capsule 120 mg  120 mg Oral Daily End, Christopher, MD   120 mg at 11/16/17 0944  . furosemide (  LASIX) tablet 20 mg  20 mg Oral Daily Lelon Perla, MD   20 mg at 11/16/17 0945  . metoprolol tartrate (LOPRESSOR) tablet 75 mg  75 mg Oral BID End, Christopher, MD   75 mg at 11/16/17 0945  . ramelteon (ROZEREM) tablet 8 mg  8 mg Oral QHS End, Christopher, MD   8 mg at 11/15/17 2147  . sotalol (BETAPACE) tablet 120 mg  120 mg Oral Q12H End, Christopher, MD   120 mg at 11/16/17 0100     Discharge Medications: Please see discharge summary for a list of discharge medications.  Relevant Imaging Results:  Relevant Lab  Results:   Additional Information SSN: [facility] [address] [place], [place]

## 2017-11-16 NOTE — Progress Notes (Signed)
Patient's heart rate 130-170's, PO Metoprol and Sotalol given.  PO Cardizem was given at 0851 and next dose due at 1200.  No PRN medications available.  Spoke with MD Stanford Breed, MD advised okay to give 1200 PO Cardizem dose early if needed.  RN will continue to monitor patient and effectiveness of metoprolol and sotalol.

## 2017-11-17 ENCOUNTER — Other Ambulatory Visit (HOSPITAL_COMMUNITY): Payer: PPO

## 2017-11-17 ENCOUNTER — Inpatient Hospital Stay (HOSPITAL_COMMUNITY): Payer: PPO

## 2017-11-17 ENCOUNTER — Telehealth: Payer: Self-pay | Admitting: Cardiovascular Disease

## 2017-11-17 DIAGNOSIS — I10 Essential (primary) hypertension: Secondary | ICD-10-CM

## 2017-11-17 DIAGNOSIS — F339 Major depressive disorder, recurrent, unspecified: Secondary | ICD-10-CM

## 2017-11-17 DIAGNOSIS — I313 Pericardial effusion (noninflammatory): Secondary | ICD-10-CM

## 2017-11-17 DIAGNOSIS — R0601 Orthopnea: Secondary | ICD-10-CM

## 2017-11-17 DIAGNOSIS — R0600 Dyspnea, unspecified: Secondary | ICD-10-CM

## 2017-11-17 LAB — BASIC METABOLIC PANEL
Anion gap: 9 (ref 5–15)
BUN: 16 mg/dL (ref 6–20)
CHLORIDE: 95 mmol/L — AB (ref 101–111)
CO2: 28 mmol/L (ref 22–32)
CREATININE: 0.74 mg/dL (ref 0.44–1.00)
Calcium: 9 mg/dL (ref 8.9–10.3)
GFR calc non Af Amer: >60 mL/min (ref >60)
GLUCOSE: 108 mg/dL — AB (ref 65–99)
Potassium: 4.2 mmol/L (ref 3.5–5.1)
Sodium: 132 mmol/L — ABNORMAL LOW (ref 135–145)

## 2017-11-17 LAB — ECHOCARDIOGRAM LIMITED
HEIGHTINCHES: 62 in
Weight: 3492.8 oz

## 2017-11-17 LAB — BODY FLUID CULTURE: CULTURE: NO GROWTH

## 2017-11-17 MED ORDER — DILTIAZEM HCL ER COATED BEADS 180 MG PO CP24
180.0000 mg | ORAL_CAPSULE | Freq: Every day | ORAL | Status: DC
  Administered 2017-11-17 – 2017-11-20 (×4): 180 mg via ORAL
  Filled 2017-11-17 (×4): qty 1

## 2017-11-17 MED ORDER — GUAIFENESIN-DM 100-10 MG/5ML PO SYRP
5.0000 mL | ORAL_SOLUTION | ORAL | Status: DC | PRN
  Administered 2017-11-17 – 2017-11-19 (×6): 5 mL via ORAL
  Filled 2017-11-17 (×6): qty 5

## 2017-11-17 NOTE — Progress Notes (Signed)
  Echocardiogram 2D Echocardiogram has been performed.  Selena Bush 11/17/2017, 2:28 PM

## 2017-11-17 NOTE — Progress Notes (Signed)
Subjective: The patient was feeling somewhat better today upon entering the room.  She denied a sensation similar to the palpitations she experienced the prior morning. She is in agreement to be discharged to a SNF when available. She continues to endorse symptoms of orthopnea.  Objective:  Vital signs in last 24 hours: Vitals:   11/16/17 1600 11/16/17 1700 11/16/17 2040 11/17/17 0514  BP: 138/71  (!) 160/96 (!) 157/82  Pulse: 69 70 76 69  Resp: 19 (!) 22  (!) 21  Temp: 97.9 F (36.6 C)  98.8 F (37.1 C) 98.3 F (36.8 C)  TempSrc: Oral  Oral Oral  SpO2: 96% 97% 97% 92%  Weight:      Height:       ROS negative except as per HPI  Physical exam: General: In no acute distress, conversant, appears comfortable at rest Neuro: Alert and oriented x4 no acute focal deficits Cardiovascular: RRR, no rubs murmurs or gallops auscultated Pulmonary: There is mild wheezing upper lung fields with distinct crackles in the lower lung field bilaterally which appears to be slightly worsened from the previous day's exam GI: Abdomen soft, nontender bowel sounds Skeletal bilateral lower extremities nonedematous, nontender  Assessment/Plan:  Active Problems:   Pericardial effusion with cardiac tamponade   Essential hypertension, benign   Hyponatremia   Sleep apnea   Hypokalemia   Atrial fibrillation with RVR (HCC)   Shortness of breath  Assessment: Selena Bush is a 72 year old female with a past medical history notable for hypertension, HFpEF (EF 60-65%) HLD, DCIS status post resection and radiation, atrial fibrillation, OSA, and hypokalemia who presented with fatigue and shortness of breath times 1.5 days. She stated that following discharge on 11/10/2017 she felt well at first, but worsened rapidly shortly therafter. When she was no longer able to tolerate her dyspnea she presented to the ED where there was no clear evidence of volume overload other than marked JVD.Echocardiogram on  11/05/2017 haddemonstrated the HFpEF but no notable pericardial inflammation or effusion. Repeat echocardiogram during this admission revealed a moderatelysized circumferential pericardial effusion with tamponade physiology as well as a small left-sided pleural effusion. Cardiology opted for pericardiocentesis due to the Tamponade physiology.Patient attested to theresolution of her dyspnea following the pericardiocentesis and continues to improve symptomatically with each day.Cardiology felt that herpresentation was most likely secondary to pericarditis and with the addition of Xarelto for PA-fib she subsequently developeda hemorrhagic pericardial effusion. Her Xarelto was discontinued due to the effusion and rate control was optimized with cardiology deferring continuation of anticoagulation until outpatient follow-up.   Dysnpea, Tamponade:  Patient's dyspnea is improved while sitting, however, she attested to orthopnea overnight.  Repeat echo was ordered for today to evaluate her pericardial effusion. If resolution is confirmed she is considered stable for discharge to SNF when available. CSW is assisting with this transfer. -Echocardiogram ordered -Repeat chest x-ray due to orthopnea concern for increased pulmonary edema given the decrease in Lasix dose.  We will follow-up on this and adjust Lasix as indicated  Paroxysmal atrial fibrillation: The patient was noted to have recurrent episodes of atrial fibrillation during the admission but she continues to experience a decrease in events. I suspect this will continue to improve.  Continue Cardizem -Continue sotalol -Continue metoprolol  Sleep Apnea:  No acute issues.  Continue CPAP for sleep  Hyponatremia: Sodium 132 on 11/17/2017.  We will continue to hold her SSRI sertraline and observe for improvement.  She will need a repeat BMP in 1-2 weeks to determine  effectiveness of the current treatment.  Depression: Patient's depression is  stable during his admission.  We will continue her home dose of Cymbalta.  She denies suicidal ideations or thoughts  Dispo: Anticipated discharge in approximately 0-1 day(s).   Kathi Ludwig, MD 11/17/2017, 6:37 AM Pager: Pager# [phone]

## 2017-11-17 NOTE — Telephone Encounter (Signed)
tcm   Patient seen at [facility] for afib rvr  Scheduled with [place] 4/18 at 1020 am

## 2017-11-17 NOTE — Care Management Note (Addendum)
Case Management Note Marvetta Gibbons RN, BSN Unit 4E-Case Manager- [place] coverage [phone]  Patient Details  Name: Selena Bush MRN: [id number] Date of Birth: October 26, 1945  Subjective/Objective:  Pt admitted with SOB- large pericardial effusion with tamponade physiology on echocardiogram-patient is now status post pericardiocentesis                  Action/Plan: PTA Pt lived at home- has large family support system. Referral received regarding home CPAP needs- spoke with pt and family at the bedside- concerns expressed are that pt has a broken CPAP at home through [place]- has had it about 11 yrs- no home 02 at this time. Pt states she has been trying to get new one- however insurance will not cover a new one without a new sleep study- pt does state that she has been working on this with her PCP- but has not been able to get into a sleep study yet. Family is concerned about pt returning home without a working CPAP- call made to [facility]- hospital rep with [employer]- to discuss what might could be done- per conversation with Jeneen Rinks- option may be to do a home sleep study which can sometimes be done quicker than sleep centers- also per [facility]'s record pt has Bipap at home- [facility] [employer] can work with pt's PCP for orders regarding Home Sleep study- in speaking to pt again after TC with [facility]- pt reports that she had a bipap however it was changed to a cpap after she lost some weight. Also she does not really want to do a home sleep study. Will f/u again with Jeneen Rinks at [place] for further options about home cpap- pt may not have any option for new cpap until she gets new sleep study per insurance guidelines.   Expected Discharge Date:                  Expected Discharge Plan:  [place]  In-House Referral:  Clinical Social Work  Discharge planning Services  CM Consult  Post Acute Care Choice:  Durable Medical Equipment Choice offered to:     DME Arranged:    DME Agency:     HH Arranged:    [place]  Agency:     Status of Service:  Completed, signed off  If discussed at [employer], dates discussed:    Discharge Disposition: skilled facility   Additional Comments:  11/17/17- 1130- Marvetta Gibbons RN, CM-  Noted recommendation for SNF- spoke with pt and daughter at the bedside to discuss transition needs- per conversation with pt and daughter pt agreeable to [facility] and feels she would benefit from rehab prior to returning home- both daughter and son live out of town. CSW consulted for SNF placement- pt wants to look at [facility] and [facility]- per MD pt ready once bed available.- also discussed cpap- have spoken again with Jeneen Rinks at [place] this am- there may be an option to complete "home sleep study" while pt is at [facility]- pt would prefer this as quickest way to get this done in order to get approved for new cpap through insurance- Jeneen Rinks will check into this option and let CM know- if this is an option then [facility] will work with PCP and facility to complete this and work on getting new cpap at home. Pt and daughter aware of this and if this doesn't work out then pt will continue to work with PCP for sleep study and cpap needs.  Update-1600- received call  back from [employer] with [employer] will not cover "home sleep study" while in a SNF- pt would have to pay out of pocket or see if SNF would cover- spoke with pt and daughter- pt has bed at [facility] will plan to d/c in am- instructed pt and daughter to call patients PCP several days prior to d/c from SNF for PCP to order home sleep study and call [facility] to arrange- once order has been received by [facility] then they can get insurance approval and make arrangements to do study once pt is discharged from SNF- pt and daughter voiced understanding of this.   Dawayne Patricia, RN 11/17/2017, 11:39 AM

## 2017-11-17 NOTE — Care Management Important Message (Signed)
Important Message  Patient Details  Name: Selena Bush MRN: [id number] Date of Birth: 1945/12/20   Medicare Important Message Given:  Yes    Stachia Slutsky P Falcon 11/17/2017, 3:25 PM

## 2017-11-17 NOTE — Progress Notes (Signed)
Patient ambulated twice in the hall on this shift using a walker,on oxygen 2L, patient ambulated about 144ft each time, she c/o of feeling tired but less than before during ambulation. Patient starts feeling SOB once she gets back in the bed and after resting in the bed for a few minutes she said''I feel pretty good'. Bed in lowest position, call bell within reach will continue to monitor.

## 2017-11-17 NOTE — Clinical Social Work Note (Signed)
Clinical Social Work Assessment  Patient Details  Name: Selena Bush MRN: [id number] Date of Birth: 11-02-1945  Date of referral:  11/17/17               Reason for consult:  Discharge Planning, Facility Placement                Permission sought to share information with:  Family Supports Permission granted to share information::  Yes, Verbal Permission Granted  Name::     Zenaida Deed  Agency::  snf  Relationship::  [phone]  Contact Information:  daughter  Housing/Transportation Living arrangements for the past 2 months:  Single Family Home Source of Information:  Patient Patient Interpreter Needed:  None Criminal Activity/Legal Involvement Pertinent to Current Situation/Hospitalization:  No - Comment as needed Significant Relationships:  Adult Children Lives with:  Self Do you feel safe going back to the place where you live?  Yes Need for family participation in patient care:  Yes (Comment)  Care giving concerns:  Patient has daughter at bedside. Patient stated she has support from adult children and grandchildren   Facilities manager / plan: CSW met patient at bedside to discuss discharge plan. Patient is agreeable to discharge to a rehab facility. [facility] had [facility] but unfortunately both facilities were full.   CSW was able to get patient into [facility] near her son. Patient has authorization from insurance and will have a bed tomorrow  Employment status:  Retired Nurse, adult PT Recommendations:  [place] / Referral to community resources:  [place]  Patient/Family's Response to care:  Family appreciates CSW role in care Patient/Family's Understanding of and Emotional Response to Diagnosis, Current Treatment, and Prognosis: Family and patient agreeable to discharge to facility tomorrow   Emotional Assessment Appearance:  Appears older than stated  age Attitude/Demeanor/Rapport:  Engaged Affect (typically observed):  Accepting Orientation:  Oriented to Self, Oriented to Place, Oriented to  Time, Oriented to Situation Alcohol / Substance use:  Not Applicable Psych involvement (Current and /or in the community):  No (Comment)  Discharge Needs  Concerns to be addressed:  No discharge needs identified Readmission within the last 30 days:  No Current discharge risk:  None Barriers to Discharge:  No Barriers Identified   Wende Neighbors, LCSW 11/17/2017, 4:48 PM

## 2017-11-17 NOTE — Progress Notes (Signed)
   I saw and examined the patient. I reviewed the resident's note and I agree with the resident's findings and plan as documented in the resident's note.  Hospital day #4 with pericardial effusion and tamponade which are now resolved after pericardiocentesis.  Patient is feeling well today, appears nearly euvolemic.  She is in good spirits, she is family at her bedside.  Still in bed, I have not seen her out of bed yet this hospitalization.  She did some walking with physical therapy yesterday, but is quite deconditioned from this hospital stay.  We are now working on placement to a skilled nursing facility for subacute rehabilitation.  Axel Filler, MD 11/17/2017, 12:40 PM

## 2017-11-17 NOTE — Progress Notes (Signed)
Progress Note  Patient Name: Selena Bush Date of Encounter: 11/17/2017  Primary Cardiologist: Dr Rockey Situ  Subjective   No chest pain or dyspnea  Inpatient Medications    Scheduled Meds: . diltiazem  30 mg Oral Q6H  . DULoxetine  120 mg Oral Daily  . furosemide  20 mg Oral Daily  . metoprolol tartrate  75 mg Oral BID  . ramelteon  8 mg Oral QHS  . sotalol  120 mg Oral Q12H   Continuous Infusions: . sodium chloride Stopped (11/14/17 2000)   PRN Meds: acetaminophen, alum & mag hydroxide-simeth   Vital Signs    Vitals:   11/16/17 1700 11/16/17 2040 11/17/17 0514 11/17/17 0824  BP:  (!) 160/96 (!) 157/82 (!) 142/84  Pulse: 70 76 69 77  Resp: (!) 22  (!) 21 (!) 24  Temp:  98.8 F (37.1 C) 98.3 F (36.8 C) 98.7 F (37.1 C)  TempSrc:  Oral Oral Oral  SpO2: 97% 97% 92% 94%  Weight:      Height:        Intake/Output Summary (Last 24 hours) at 11/17/2017 0957 Last data filed at 11/16/2017 2200 Gross per 24 hour  Intake 1400 ml  Output 600 ml  Net 800 ml   Filed Weights   11/12/17 1150 11/12/17 1842 11/13/17 0538  Weight: 227 lb 1.2 oz (103 kg) 219 lb 4.8 oz (99.5 kg) 218 lb 4.8 oz (99 kg)    Telemetry    Sinus Personally Reviewed   Physical Exam   GEN: WD obese NAD Neck: supple Cardiac: RRR, no murmur Respiratory: Mildly diminished BS bases GI: Soft, NT/ND, pericardiocentesis site without drainage, no masses MS: No edema Neuro:  Grossly intact   Labs    Chemistry Recent Labs  Lab 11/12/17 1216  11/15/17 0333 11/16/17 0414 11/17/17 0314  NA 127*   < > 130* 130* 132*  K 3.7   < > 4.0 3.7 4.2  CL 92*   < > 91* 94* 95*  CO2 24   < > 25 26 28   GLUCOSE 136*   < > 103* 138* 108*  BUN 29*   < > 27* 21* 16  CREATININE 1.19*   < > 1.03* 0.84 0.74  CALCIUM 8.9   < > 8.8* 8.7* 9.0  PROT 6.4*  --   --   --   --   ALBUMIN 3.0*  --   --   --   --   AST 22  --   --   --   --   ALT 18  --   --   --   --   ALKPHOS 81  --   --   --   --   BILITOT 0.8   --   --   --   --   GFRNONAA 45*   < > 54* >60 >60  GFRAA 53*   < > >60 >60 >60  ANIONGAP 11   < > 14 10 9    < > = values in this interval not displayed.     Hematology Recent Labs  Lab 11/12/17 1216 11/14/17 0242 11/15/17 0333  WBC 10.4 10.3 11.6*  RBC 3.96 4.17 3.73*  HGB 10.4* 11.0* 10.2*  HCT 32.7* 35.2* 31.6*  MCV 82.6 84.4 84.7  MCH 26.3 26.4 27.3  MCHC 31.8 31.3 32.3  RDW 13.7 13.6 14.1  PLT 403* 362 420*    Recent Labs  Lab 11/12/17 1534 11/12/17 1811  TROPIPOC  0.00 0.00     BNP Recent Labs  Lab 11/12/17 1216  BNP 395.7*     DDimer  Recent Labs  Lab 11/12/17 1812  DDIMER 3.00*     Patient Profile     72 year old female with past medical history of diastolic congestive heart failure, hypertension, hyperlipidemia, obstructive sleep apnea, breast cancer and recent diagnosis of atrial fibrillation with pericardial effusion with tamponade physiology.  Patient discharged March 29 following admission for atypical chest pain and paroxysmal atrial fibrillation.  Echocardiogram showed normal LV function and trivial pericardial effusion.  She was treated with sotalol and Xarelto.  Saturday, March 30 she noticed onset of dyspnea which slowly worsened. FU echocardiogram showed large pericardial effusion with respiratory variation and dilated IVC concerning for tamponade physiology. Had pericardiocentesis 4/1.    Assessment & Plan    1 large pericardial effusion with tamponade physiology s/p pericardiocentesis. Pericardial fluid cx negative to date and cytology without malignancy. I think the most likely unifying diagnosis is recent pericarditis; xarelto then initiated for PAF resulting in hemorrhagic pericardial effusion. Will plan repeat limited echo this AM to make sure effusion not reacumulating. If neg, pt can be DCed and fu with Dr Rockey Situ in [place].  2 paroxysmal atrial fibrillation-patient in sinus this AM.We will continue metoprolol at present dose;  change cadizem to 180 mg daily.  Continue sotalol.  I am hopeful that paroxysmal atrial fibrillation will improve as pericardial process resolves.  We will not anticoagulate given recent hemorrhagic pericardial effusion.  Can consider resuming anticoagulation in 4-6 weeks.  3 hypertension-blood pressure is elevated; change cardizem as outlined; continue lasix 20 mg daily.  If no effusion, pt can be DCed today; fu Dr Rockey Situ 2 weeks.   For questions or updates, please contact [place] Please consult [email or web address] for contact info under Cardiology/STEMI.      Signed, [name], MD  11/17/2017, 9:57 AM

## 2017-11-17 NOTE — Discharge Summary (Signed)
Name: Selena Bush MRN: [id number] DOB: 07-Sep-1945 72 y.o. PCP: Selena Honour, MD  Date of Admission: 11/12/2017 11:20 AM Date of Discharge: 11/19/2017 Attending Physician: Selena Bush, *  Discharge Diagnosis: Active Problems:   Essential hypertension, benign   Hyponatremia   Sleep apnea   Atrial fibrillation with RVR (HCC)   Shortness of breath   Pericardial effusion with cardiac tamponade   Discharge Medications: Allergies as of 11/19/2017      Reactions   Penicillins Anaphylaxis   anaphylaxis  Has patient had a PCN reaction causing immediate rash, facial/tongue/throat swelling, SOB or lightheadedness with hypotension: Yes Has patient had a PCN reaction causing severe rash involving mucus membranes or skin necrosis: No Has patient had a PCN reaction that required hospitalization: No Has patient had a PCN reaction occurring within the last 10 years: No If all of the above answers are "NO", then may proceed with Cephalosporin use.   Sulfa Antibiotics Anaphylaxis   Asa [aspirin]    "burned my stomach intensely"   Codeine    Gi problems   Statins Other (See Comments)   Leg pains      Medication List    STOP taking these medications   lisinopril-hydrochlorothiazide 20-12.5 MG tablet Commonly known as:  PRINZIDE,ZESTORETIC   raloxifene 60 MG tablet Commonly known as:  EVISTA   rivaroxaban 20 MG Tabs tablet Commonly known as:  XARELTO   sertraline 50 MG tablet Commonly known as:  ZOLOFT   Suvorexant 20 MG Tabs Commonly known as:  BELSOMRA     TAKE these medications   colchicine 0.6 MG tablet Take 1 tablet (0.6 mg total) by mouth 2 (two) times daily. What changed:    when to take this  reasons to take this   CYMBALTA 60 MG capsule Generic drug:  DULoxetine Take 120 mg by mouth daily.   diltiazem 180 MG 24 hr capsule Commonly known as:  CARDIZEM CD Take 1 capsule (180 mg total) by mouth daily.   esomeprazole 40 MG capsule Commonly known  as:  NEXIUM Take 1 capsule (40 mg total) by mouth every morning.   fluticasone 50 MCG/ACT nasal spray Commonly known as:  FLONASE Place 2 sprays into both nostrils daily as needed for rhinitis or allergies.   furosemide 40 MG tablet Commonly known as:  LASIX Take 1 tablet (40 mg total) by mouth daily.   guaiFENesin-dextromethorphan 100-10 MG/5ML syrup Commonly known as:  ROBITUSSIN DM Take 5 mLs by mouth every 4 (four) hours as needed for cough.   lisinopril 20 MG tablet Commonly known as:  PRINIVIL,ZESTRIL Take 1 tablet (20 mg total) by mouth daily.   LORazepam 0.5 MG tablet Commonly known as:  ATIVAN Take 0.5 mg by mouth 2 (two) times daily as needed for anxiety.   Metoprolol Tartrate 75 MG Tabs Take 75 mg by mouth 2 (two) times daily.   sotalol 120 MG tablet Commonly known as:  BETAPACE Take 1 tablet (120 mg total) by mouth every 12 (twelve) hours.   tiZANidine 4 MG tablet Commonly known as:  ZANAFLEX TAKE 1 TABLET BY MOUTH AT SUPPER AND TAKE 1 TABLET BY MOUTH AT BEDTIME       Disposition and follow-up:   Selena Bush was discharged from Sel[facility] in Stable condition.  At the hospital follow up visit please address:  1.   A. Pericardial Tamponade: Please assess the patients dyspnea and chest pain.  B. Hyponatremia: The patient attested to taking  Sertraline, we could not confirm this with chart review and given her hyponatremia we opted to discontinue this due to concern for SIADH.   C. Please address her depression.   D. HTN: Stable following resolution of her Tamponade  E. Holding patients Suvorexant and raloxifene due to the risk of hypercoagulation which in conjunction with her A-fib w/o is a potential for stroke. Please discuss this with her Oncologist for risks vs benefits.   2.  Labs / imaging needed at time of follow-up: Consider Echo if dyspnea reoccurs, BMP  3.  Pending labs/ test needing follow-up: n/a  Follow-up  Appointments: Follow-up Information    Selena Merritts, MD Follow up on 11/30/2017.   Specialty:  Cardiology Why:  Please arrive 15 minutes early for your 10:20am appointment Contact information: [place] [id number] [place] Hospital Course by problem list: Active Problems:   Essential hypertension, benign   Hyponatremia   Sleep apnea   Atrial fibrillation with RVR (HCC)   Shortness of breath   Pericardial effusion with cardiac tamponade   1. Dysnpea, Tamponade:  Selena Bush is a 72 year old female with a past medical history notable for hypertension, HFpEF (EF 60-65%) HLD, DCIS status post resection and radiation, atrial fibrillation, OSA, and hypokalemia who presented with fatigue and shortness of breath times 1.5 days. She stated that following discharge on 11/10/2017 she felt well at first, but worsened rapidly shortly therafter. When she was no longer able to tolerate her dyspnea she presented to the ED where there was no clear evidence of volume overload other than marked JVD.Echocardiogram on 11/05/2017 had demonstrated the HFpEF but no notable pericardial inflammation or effusion. Repeat echocardiogram during this admission revealed a moderately sized circumferential pericardial effusion with tamponade physiology as well as a small left-sided pleural effusion. Cardiology opted for pericardiocentesis due to the Tamponade physiology.Patient attested to the resolution of her dyspnea following the pericardiocentesis and continues to improve symptomatically with each day.Cardiology felt that her presentation was most likely secondary to pericarditis and with the addition of Xarelto for PA-fib she subsequently developed a hemorrhagic pericardial effusion. Her Xarelto was discontinued due to the effusion and rate control was optimized with cardiology deferring continuation of anticoagulation until outpatient follow-up.   2. Paroxysmal  Atrial Fibrillation: The patient was noted to have recurrent episodes of a-fib during the admission. This is likely dueto her underlying illness and cessation of her Cardizem 2/2 hypotension. Upon reinitiationi of Cardizem and in conjunction with her improvement she was able to maintain NSR. We feel that this will continue to improve with improvement of her pericarditis/effuion and will continue rate/rhythm control. Continued sotalol, metoprolol, and cardizem.   3. Sleep Apnea: No acute issues. The patient was placed on her CPAP during the admission.   4. Hyponatremia:  The patient has been chronically hyponatremic since 2017. This is thought to be SIADH and possibly related to her SSRI. We discontinued Sertraline that was initiated on admission after convincing the patient that this could lead to an increased risk for falls, confusion and has a poor long term prognosis.   5. Depression: Stable during admission. Continued on her home Cymbalta. No adjustments indicated.   Discharge Vitals:   BP (!) 149/78 (BP Location: Right Arm)   Pulse 70   Temp 99.3 F (37.4 C) (Oral)   Resp (!) 21   Ht 5\' 2"  (1.575 m)   Wt 219 lb 2.2  oz (99.4 kg)   SpO2 99%   BMI 40.08 kg/m   Pertinent Labs, Studies, and Procedures:  CMP Latest Ref Rng & Units 11/19/2017 11/17/2017 11/16/2017  Glucose 65 - 99 mg/dL 103(H) 108(H) 138(H)  BUN 6 - 20 mg/dL 12 16 21(H)  Creatinine 0.44 - 1.00 mg/dL 0.77 0.74 0.84  Sodium 135 - 145 mmol/L 132(L) 132(L) 130(L)  Potassium 3.5 - 5.1 mmol/L 3.7 4.2 3.7  Chloride 101 - 111 mmol/L 91(L) 95(L) 94(L)  CO2 22 - 32 mmol/L 29 28 26   Calcium 8.9 - 10.3 mg/dL 9.0 9.0 8.7(L)  Total Protein 6.5 - 8.1 g/dL - - -  Total Bilirubin 0.3 - 1.2 mg/dL - - -  Alkaline Phos 38 - 126 U/L - - -  AST 15 - 41 U/L - - -  ALT 14 - 54 U/L - - -   CBC Latest Ref Rng & Units 11/15/2017 11/14/2017 11/12/2017  WBC 4.0 - 10.5 K/uL 11.6(H) 10.3 10.4  Hemoglobin 12.0 - 15.0 g/dL 10.2(L) 11.0(L) 10.4(L)    Hematocrit 36.0 - 46.0 % 31.6(L) 35.2(L) 32.7(L)  Platelets 150 - 400 K/uL 420(H) 362 403(H)   Echo 11/17/2017: Study Conclusions  - Left ventricle: The cavity size was normal. There was severe   focal basal and moderate concentric hypertrophy. Systolic   function was normal. The estimated ejection fraction was in the   range of 60% to 65%. Wall motion was normal; there were no   regional wall motion abnormalities. The study is not technically   sufficient to allow evaluation of LV diastolic function. - Aortic valve: Noncoronary cusp immobility was noted. - Mitral valve: Calcified annulus. - Left atrium: The atrium was mildly dilated. - Pericardium, extracardiac: There was a left pleural effusion.  Pleural effusion fluid analysis: Cytology-negative Gram Stain: collected Cell count:   Ref Range & Units 4d ago  Fluid Type-FCT  PERICARDIAL VC  Comment: CORRECTED ON 04/01 AT 1853: PREVIOUSLY REPORTED AS PERICARDIAL SAC  Color, Fluid YELLOW REDAbnormal    Appearance, Fluid CLEAR CLOUDYAbnormal    WBC, Fluid 0 - 1,000 cu mm 2,425High    Neutrophil Count, Fluid 0 - 25 % 70High    Lymphs, Fluid % 17   Monocyte-Macrophage-Serous Fluid 50 - 90 % 12Low    Eos, Fluid % 1    DG Chest 11/13/2017: IMPRESSION: 1. Placement of drainage catheter over the left lower chest 2. Increased airspace disease at the left base may reflect increasing atelectasis or infiltrate. Possible small left effusion. 3. Subsegmental atelectasis at the right base 4. Cardiomegaly  Echo 11/14/2017: Study Conclusions - Left ventricle: Systolic function was vigorous. The estimated   ejection fraction was in the range of 65% to 70%. - Mitral valve: Severely calcified annulus. - Left atrium: The atrium was moderately dilated. - Pericardium, extracardiac: No significant pericardial effusion   IVC normal dimension  Pericardiocentesis 11/13/2017: Conclusions: 1. Successful ultrasound and fluoroscopic guided  pericardiocentesis and pericardial drain placement (tube pericardiostomy) via the subxiphoid approach yielding 620 mL of blood fluid.  Recommendations: 1. Transfer to 2 Heart for pericardial drain management. 2. Obtain STAT CXR when patient arrives in ICU to confirm tube placement and exclude complications such as pneumothorax. 3. Keep drain to negative pressure using Doval device; record output every 4 hours. 4. Drain to be removed when less than 50 mL of output in 24 hours and no significant residual effusion on echo. 5. Hold all anticoagulants at this time.    Echo 11/13/2017: Study Conclusions  -  Left ventricle: The cavity size was normal. Wall thickness was   increased in a pattern of moderate LVH. Systolic function was   mildly to moderately reduced. The estimated ejection fraction was   in the range of 40% to 45%. - Mitral valve: Mildly calcified annulus. - Left atrium: The atrium was mildly dilated. - Pulmonary arteries: Systolic pressure was mildly increased. PA   peak pressure: 38 mm Hg (S). - Pericardium, extracardiac: A moderate to large pericardial   effusion was identified circumferential to the heart.  Echo 11/05/2017: Study Conclusions  - Left ventricle: The cavity size was normal. Wall thickness was   increased in a pattern of mild LVH. Systolic function was normal.   The estimated ejection fraction was in the range of 60% to 65%.   Wall motion was normal; there were no regional wall motion   abnormalities. The study is not technically sufficient to allow   evaluation of LV diastolic function. - Aortic valve: Moderate calcification involving the noncoronary   cusp. Noncoronary cusp mobility was restricted. - Mitral valve: Moderately calcified annulus. There was mild   regurgitation. - Left atrium: The atrium was mildly dilated. - Right atrium: Central venous pressure (est): 3 mm Hg. - Tricuspid valve: There was mild regurgitation. - Pulmonary arteries:  Systolic pressure could not be accurately   estimated. - Pericardium, extracardiac: There was no pericardial effusion.  Discharge Instructions: Discharge Instructions    Call MD for:  difficulty breathing, headache or visual disturbances   Complete by:  As directed    Call MD for:  temperature >100.4   Complete by:  As directed    Diet - low sodium heart healthy   Complete by:  As directed    Diet - low sodium heart healthy   Complete by:  As directed    Increase activity slowly   Complete by:  As directed    Increase activity slowly   Complete by:  As directed       Signed: Ledell Noss, MD 11/19/2017, 10:26 AM   Pager: Pager# [phone]

## 2017-11-18 MED ORDER — LISINOPRIL 10 MG PO TABS
20.0000 mg | ORAL_TABLET | Freq: Every day | ORAL | Status: DC
  Administered 2017-11-18 – 2017-11-20 (×3): 20 mg via ORAL
  Filled 2017-11-18 (×4): qty 2

## 2017-11-18 MED ORDER — FUROSEMIDE 10 MG/ML IJ SOLN
30.0000 mg | Freq: Once | INTRAMUSCULAR | Status: AC
  Administered 2017-11-18: 30 mg via INTRAVENOUS
  Filled 2017-11-18: qty 4

## 2017-11-18 MED ORDER — FUROSEMIDE 40 MG PO TABS
40.0000 mg | ORAL_TABLET | Freq: Every day | ORAL | Status: DC
  Administered 2017-11-19 – 2017-11-20 (×2): 40 mg via ORAL
  Filled 2017-11-18 (×2): qty 1

## 2017-11-18 NOTE — Progress Notes (Signed)
Patient does not want to wear her CPAP tonight.

## 2017-11-18 NOTE — Progress Notes (Signed)
   Subjective: Selena Bush says that she continues to feel well today. She feels that her breathing is at baseline. She has been having a cough off and on for the past month and it did become productive cough of purulent sputum about three days ago. She has remained afebrile and without chills. Her appetite is at baseline. She has had pneumonia in the past and this does not feel like that event.  Objective:  Vital signs in last 24 hours: Vitals:   11/18/17 0634 11/18/17 0847 11/18/17 0857 11/18/17 1002  BP: (!) 172/97 (!) 188/115 (!) 173/99 (!) 162/88  Pulse:      Resp: (!) 29  (!) 22   Temp:  98.2 F (36.8 C)    TempSrc:  Oral    SpO2:  98%    Weight:      Height:       General: sitting up comfortably in bed  Cardiac: RRR, no peripheral edema  Pulm: accessory muscle use, diffuse expiratory wheeze, fine rhonchi bilateral left > right  GI: soft, non tender    Assessment/Plan:  Productive cough  Symptoms of viral URI with non productive cough for over a month now with cough productive of purulent sputum beginning a few days ago. Post URI pneumonia is a consideration however she is afebrile without rigors, with regular appetite and chest xray yesterday did not show consolidation. She has no history of COPD but is wheezing on exam and has some rhonchi chest xray did not show pulmonary edema but on echo yesterday the IVC was distended with reduced compression and left pleural effusion. Increased volume would not be unusual given her presentation. Will continue to follow her vital signs closely today, she may be ready for discharge to SNF later today.  - incentive spirometry and up in chair today  - one dose of IV lasix - continue to monitor vitals     Atrial fibrillation with RVR (HCC) - in NSR at this time  - cont cardizem and metoprolol for rate control  - on sotalol for rhythm control  - no AC given recent pericardial effusion  - cardiology managing, we appreciate their recommendations      Pericardial effusion with cardiac tamponade - s/p pericardiocentesis 4/1 with drain removed - echo yesterday without signs of re accumulation  - continue to hold anticoagulants     Essential hypertension, benign Blood pressure was elevated overnight but controlled after morning medications.  -Continue carvedilol, metoprolol, sotalol    Hyponatremia - continue to hold sertraline  - will need repeat BMP 1-2 weeks post discharge     Sleep apnea - continue CPAP for sleep    Dispo: Anticipated discharge in approximately 1 day(s).   Ledell Noss, MD 11/18/2017, 10:29 AM Pager: [phone]

## 2017-11-18 NOTE — Progress Notes (Signed)
B.P 172/97 P-86 . Text page Dr. Dareen Piano  About high B.P.

## 2017-11-18 NOTE — Progress Notes (Signed)
Dr. Dareen Piano return call and will have day shift team address B.P.

## 2017-11-18 NOTE — Progress Notes (Signed)
Progress Note  Patient Name: Selena Bush Date of Encounter: 11/18/2017  Primary Cardiologist: Dr Rockey Situ  Subjective   Pt complains of dyspnea; no chest pain  Inpatient Medications    Scheduled Meds: . diltiazem  180 mg Oral Daily  . DULoxetine  120 mg Oral Daily  . furosemide  20 mg Oral Daily  . metoprolol tartrate  75 mg Oral BID  . ramelteon  8 mg Oral QHS  . sotalol  120 mg Oral Q12H   Continuous Infusions: . sodium chloride Stopped (11/14/17 2000)   PRN Meds: acetaminophen, alum & mag hydroxide-simeth, guaiFENesin-dextromethorphan   Vital Signs    Vitals:   11/17/17 2308 11/18/17 0449 11/18/17 0521 11/18/17 0634  BP: (!) 171/98 (!) 173/112  (!) 172/97  Pulse: 68 76    Resp: (!) 28 (!) 23 (!) 23 (!) 29  Temp:  97.7 F (36.5 C)    TempSrc:  Oral    SpO2: 100% 97%    Weight:   216 lb 9.6 oz (98.2 kg)   Height:        Intake/Output Summary (Last 24 hours) at 11/18/2017 0845 Last data filed at 11/17/2017 2030 Gross per 24 hour  Intake 360 ml  Output 1051 ml  Net -691 ml   Filed Weights   11/12/17 1842 11/13/17 0538 11/18/17 0521  Weight: 219 lb 4.8 oz (99.5 kg) 218 lb 4.8 oz (99 kg) 216 lb 9.6 oz (98.2 kg)    Telemetry    Sinus with brief PAT; Personally Reviewed   Physical Exam   GEN: WD obese mildly dyspneic Neck: supple; JVD difficult to assess Cardiac: RRR, 2/6 systolic murmur Respiratory: Mildly diminished BS bases; no wheeze GI: Soft, NT/ND MS: No edema Neuro:  No focal findings   Labs    Chemistry Recent Labs  Lab 11/12/17 1216  11/15/17 0333 11/16/17 0414 11/17/17 0314  NA 127*   < > 130* 130* 132*  K 3.7   < > 4.0 3.7 4.2  CL 92*   < > 91* 94* 95*  CO2 24   < > 25 26 28   GLUCOSE 136*   < > 103* 138* 108*  BUN 29*   < > 27* 21* 16  CREATININE 1.19*   < > 1.03* 0.84 0.74  CALCIUM 8.9   < > 8.8* 8.7* 9.0  PROT 6.4*  --   --   --   --   ALBUMIN 3.0*  --   --   --   --   AST 22  --   --   --   --   ALT 18  --   --   --    --   ALKPHOS 81  --   --   --   --   BILITOT 0.8  --   --   --   --   GFRNONAA 45*   < > 54* >60 >60  GFRAA 53*   < > >60 >60 >60  ANIONGAP 11   < > 14 10 9    < > = values in this interval not displayed.     Hematology Recent Labs  Lab 11/12/17 1216 11/14/17 0242 11/15/17 0333  WBC 10.4 10.3 11.6*  RBC 3.96 4.17 3.73*  HGB 10.4* 11.0* 10.2*  HCT 32.7* 35.2* 31.6*  MCV 82.6 84.4 84.7  MCH 26.3 26.4 27.3  MCHC 31.8 31.3 32.3  RDW 13.7 13.6 14.1  PLT 403* 362 420*    Recent  Labs  Lab 11/12/17 1534 11/12/17 1811  TROPIPOC 0.00 0.00     BNP Recent Labs  Lab 11/12/17 1216  BNP 395.7*     DDimer  Recent Labs  Lab 11/12/17 1812  DDIMER 3.00*     Patient Profile     72 year old female with past medical history of diastolic congestive heart failure, hypertension, hyperlipidemia, obstructive sleep apnea, breast cancer and recent diagnosis of atrial fibrillation with pericardial effusion with tamponade physiology.  Patient discharged March 29 following admission for atypical chest pain and paroxysmal atrial fibrillation.  Echocardiogram showed normal LV function and trivial pericardial effusion.  She was treated with sotalol and Xarelto.  Saturday, March 30 she noticed onset of dyspnea which slowly worsened. FU echocardiogram showed large pericardial effusion with respiratory variation and dilated IVC concerning for tamponade physiology. Had pericardiocentesis 4/1.    Assessment & Plan    1 large pericardial effusion with tamponade physiology s/p pericardiocentesis. Pericardial fluid cx negative and cytology without malignancy. I think the most likely unifying diagnosis is recent pericarditis; xarelto then initiated for PAF resulting in hemorrhagic pericardial effusion. FU echo yesterday showed no pericardial effusion (personally reviewed).   2 paroxysmal atrial fibrillation-patient in sinus this AM.We will continue metoprolol and cardizem. Continue sotalol.  I am hopeful  that paroxysmal atrial fibrillation will improve as pericardial process resolves.  We will not anticoagulate given recent hemorrhagic pericardial effusion.  Can consider resuming anticoagulation in 4-6 weeks.  3 hypertension-blood pressure is elevated; Pt also dyspneic this AM. Increase lasix to 40 mg daily and resume lisinopril 20 mg daily.  4 Productive cough-pt with productive cough (greenish sputum); may need repeat chest xray and antibiotics; will leave to primary care.   Do not think pt ready for DC after evaluating today.    For questions or updates, please contact [place] Please consult [email or web address] for contact info under Cardiology/STEMI.      Signed, [name], MD  11/18/2017, 8:45 AM

## 2017-11-19 DIAGNOSIS — J811 Chronic pulmonary edema: Secondary | ICD-10-CM

## 2017-11-19 LAB — BASIC METABOLIC PANEL
ANION GAP: 12 (ref 5–15)
BUN: 12 mg/dL (ref 6–20)
CHLORIDE: 91 mmol/L — AB (ref 101–111)
CO2: 29 mmol/L (ref 22–32)
CREATININE: 0.77 mg/dL (ref 0.44–1.00)
Calcium: 9 mg/dL (ref 8.9–10.3)
GFR calc non Af Amer: >60 mL/min (ref >60)
Glucose, Bld: 103 mg/dL — ABNORMAL HIGH (ref 65–99)
Potassium: 3.7 mmol/L (ref 3.5–5.1)
SODIUM: 132 mmol/L — AB (ref 135–145)

## 2017-11-19 MED ORDER — LISINOPRIL 20 MG PO TABS: 20.0000 mg | ORAL_TABLET | Freq: Every day | ORAL | 0 refills | Status: DC

## 2017-11-19 MED ORDER — GUAIFENESIN-DM 100-10 MG/5ML PO SYRP: 5.0000 mL | ORAL_SOLUTION | ORAL | 0 refills | Status: DC | PRN

## 2017-11-19 NOTE — Progress Notes (Signed)
Internal Medicine Attending:   I saw and examined the patient. I reviewed the resident's note and I agree with the resident's findings and plan as documented in the resident's note.  Patient feels well today with no new complaints.  She does have some mild persistent cough which is productive of whitish phlegm.  Patient was initially admitted with worsening shortness of breath and was found to have A. fib with RVR as well as pericardial effusion with cardiac tamponade physiology.  Patient is now status post pericardiocentesis on April 1 and has had her drain removed.  Her repeat echo on April 5 showed no signs of reaccumulation of fluid.  Continue to hold Eliquis for now given recent pericardial effusion. No further workup at this time.  Patient also with a productive cough over the last couple of days with whitish phlegm production.  I suspect some of this may be due to slight fluid overload.  She has no evidence of an acute infection at this time.  She received a dose of IV Lasix yesterday.  Her lung exam was clear to auscultation today.  She has no further workup at this time.  She is stable for discharge to SNF once bed available.

## 2017-11-19 NOTE — Progress Notes (Signed)
   Subjective: Ms. Yassin feels that her breathing is better than yesterday. She continues to have a productive cough.  He has been up and walking the halls and feels prepared for discharge to short-term nursing facility.    Objective:  Vital signs in last 24 hours: Vitals:   11/18/17 2123 11/18/17 2200 11/19/17 0433 11/19/17 0711  BP:   132/84 (!) 149/78  Pulse: 81 72  70  Resp:  (!) 22  (!) 21  Temp:   98.6 F (37 C) 99.3 F (37.4 C)  TempSrc:   Oral Oral  SpO2:  95%  99%  Weight:   219 lb 2.2 oz (99.4 kg)   Height:       General: Well-appearing, no acute distress Cardiac: Regular rate and rhythm, no peripheral edema  Pulm: No respiratory distress, lungs clear to auscultation Gastro: soft, non tender, non distended   Assessment/Plan:    Atrial fibrillation with RVR (HCC) - in NSR at this time  - cont cardizem and metoprolol for rate control  - on sotalol for rhythm control  - no AC given recent pericardial effusion  - cardiology managing, we appreciate their recommendations     Pericardial effusion with cardiac tamponade - s/p pericardiocentesis 4/1 with drain removed - echo 4/5 without signs of re accumulation  - continue to hold anticoagulants   Productive cough  Symptoms of viral URI with non productive cough for over a month now with cough productive of purulent sputum beginning a few days ago.  She remains afebrile and otherwise feeling well. She is not coughing at this time and bibasilar rhonchi have improved with IV lasix yesterday.     Essential hypertension, benign Blood pressure is controlled.  -Continue carvedilol, metoprolol, sotalol    Hyponatremia - discontinue sertraline at discharge  - will need repeat BMP 1-2 weeks post discharge     Sleep apnea  - continue CPAP for sleep   Dispo: Anticipated discharge pending SNF placement   Ledell Noss, MD 11/19/2017, 12:16 PM Pager: [phone]

## 2017-11-19 NOTE — Clinical Social Work Note (Signed)
Clinical Social Worker continuing to follow patient and family for support and discharge planning needs.  CSW spoke with patient daughter in regards to [facility] inability to accept admissions on Sunday's.  Patient daughter verbalized frustrations but understanding and agreed to pursue other options.  CSW unable to secure a bed or switch insurance authorization today, therefore patient will admit to [facility] 4/8.  Facility, MD, and patient family all aware of plan.  CSW remains available for support and to facilitate patient discharge needs.  Barbette Or, [place]

## 2017-11-19 NOTE — Progress Notes (Signed)
Progress Note  Patient Name: Selena Bush Date of Encounter: 11/19/2017  Primary Cardiologist: Dr Stanford Breed  Subjective   Pt denies CP or dyspnea  Inpatient Medications    Scheduled Meds: . diltiazem  180 mg Oral Daily  . DULoxetine  120 mg Oral Daily  . furosemide  40 mg Oral Daily  . lisinopril  20 mg Oral Daily  . metoprolol tartrate  75 mg Oral BID  . ramelteon  8 mg Oral QHS  . sotalol  120 mg Oral Q12H   Continuous Infusions: . sodium chloride Stopped (11/14/17 2000)   PRN Meds: acetaminophen, alum & mag hydroxide-simeth, guaiFENesin-dextromethorphan   Vital Signs    Vitals:   11/18/17 2123 11/18/17 2200 11/19/17 0433 11/19/17 0711  BP:   132/84 (!) 149/78  Pulse: 81 72  70  Resp:  (!) 22  (!) 21  Temp:   98.6 F (37 C) 99.3 F (37.4 C)  TempSrc:   Oral Oral  SpO2:  95%  99%  Weight:   219 lb 2.2 oz (99.4 kg)   Height:        Intake/Output Summary (Last 24 hours) at 11/19/2017 0855 Last data filed at 11/18/2017 1747 Gross per 24 hour  Intake 240 ml  Output -  Net 240 ml   Filed Weights   11/13/17 0538 11/18/17 0521 11/19/17 0433  Weight: 218 lb 4.8 oz (99 kg) 216 lb 9.6 oz (98.2 kg) 219 lb 2.2 oz (99.4 kg)    Telemetry    Sinus; Personally Reviewed   Physical Exam   GEN: WD obese NAD Neck: Supple Cardiac: RRR, 2/6 systolic murmur, no DM Respiratory: Mildly diminished BS bases GI: Soft, NT/ND, no masses MS: No edema Neuro:  Grossly intact   Labs    Chemistry Recent Labs  Lab 11/12/17 1216  11/16/17 0414 11/17/17 0314 11/19/17 0249  NA 127*   < > 130* 132* 132*  K 3.7   < > 3.7 4.2 3.7  CL 92*   < > 94* 95* 91*  CO2 24   < > 26 28 29   GLUCOSE 136*   < > 138* 108* 103*  BUN 29*   < > 21* 16 12  CREATININE 1.19*   < > 0.84 0.74 0.77  CALCIUM 8.9   < > 8.7* 9.0 9.0  PROT 6.4*  --   --   --   --   ALBUMIN 3.0*  --   --   --   --   AST 22  --   --   --   --   ALT 18  --   --   --   --   ALKPHOS 81  --   --   --   --     BILITOT 0.8  --   --   --   --   GFRNONAA 45*   < > >60 >60 >60  GFRAA 53*   < > >60 >60 >60  ANIONGAP 11   < > 10 9 12    < > = values in this interval not displayed.     Hematology Recent Labs  Lab 11/12/17 1216 11/14/17 0242 11/15/17 0333  WBC 10.4 10.3 11.6*  RBC 3.96 4.17 3.73*  HGB 10.4* 11.0* 10.2*  HCT 32.7* 35.2* 31.6*  MCV 82.6 84.4 84.7  MCH 26.3 26.4 27.3  MCHC 31.8 31.3 32.3  RDW 13.7 13.6 14.1  PLT 403* 362 420*    Recent Labs  Lab 11/12/17 1534 11/12/17 1811  TROPIPOC 0.00 0.00     BNP Recent Labs  Lab 11/12/17 1216  BNP 395.7*     DDimer  Recent Labs  Lab 11/12/17 1812  DDIMER 3.00*     Patient Profile     72 year old female with past medical history of diastolic congestive heart failure, hypertension, hyperlipidemia, obstructive sleep apnea, breast cancer and recent diagnosis of atrial fibrillation with pericardial effusion with tamponade physiology.  Patient discharged March 29 following admission for atypical chest pain and paroxysmal atrial fibrillation.  Echocardiogram showed normal LV function and trivial pericardial effusion.  She was treated with sotalol and Xarelto.  Saturday, March 30 she noticed onset of dyspnea which slowly worsened. FU echocardiogram showed large pericardial effusion with respiratory variation and dilated IVC concerning for tamponade physiology. Had pericardiocentesis 4/1.    Assessment & Plan    1 large pericardial effusion with tamponade physiology s/p pericardiocentesis. Pericardial fluid cx negative and cytology without malignancy. I think the most likely unifying diagnosis is recent pericarditis; xarelto then initiated for PAF resulting in hemorrhagic pericardial effusion. FU echo yesterday showed no pericardial effusion (personally reviewed). No plans for further cardiology W/U.   2 paroxysmal atrial fibrillation-patient remains in sinus this AM.We will continue metoprolol and cardizem for rate control if  atrial fibrillation recurs. Continue sotalol. Paroxysmal atrial fibrillation appears to be improving as pericardial process resolves.  We will not anticoagulate given recent hemorrhagic pericardial effusion.  Can consider resuming anticoagulation in 4-6 weeks.  3 hypertension-Lisinopril resumed yesterday; follow BP and adjust regimen as needed.  4 Productive cough-management per IM.  Pt can be DCed from a cardiac standpoint and fu with me for cardiac issues in 4-6 weeks.    For questions or updates, please contact [place] Please consult [email or web address] for contact info under Cardiology/STEMI.      Signed, [name], MD  11/19/2017, 8:55 AM

## 2017-11-20 ENCOUNTER — Other Ambulatory Visit: Payer: Self-pay | Admitting: Pharmacist

## 2017-11-20 DIAGNOSIS — K219 Gastro-esophageal reflux disease without esophagitis: Secondary | ICD-10-CM | POA: Diagnosis not present

## 2017-11-20 DIAGNOSIS — Z882 Allergy status to sulfonamides status: Secondary | ICD-10-CM

## 2017-11-20 DIAGNOSIS — F329 Major depressive disorder, single episode, unspecified: Secondary | ICD-10-CM | POA: Diagnosis not present

## 2017-11-20 DIAGNOSIS — R55 Syncope and collapse: Secondary | ICD-10-CM | POA: Diagnosis not present

## 2017-11-20 DIAGNOSIS — I110 Hypertensive heart disease with heart failure: Secondary | ICD-10-CM | POA: Diagnosis not present

## 2017-11-20 DIAGNOSIS — I10 Essential (primary) hypertension: Secondary | ICD-10-CM | POA: Diagnosis not present

## 2017-11-20 DIAGNOSIS — Z885 Allergy status to narcotic agent status: Secondary | ICD-10-CM

## 2017-11-20 DIAGNOSIS — Z86000 Personal history of in-situ neoplasm of breast: Secondary | ICD-10-CM | POA: Diagnosis not present

## 2017-11-20 DIAGNOSIS — I4891 Unspecified atrial fibrillation: Secondary | ICD-10-CM | POA: Diagnosis not present

## 2017-11-20 DIAGNOSIS — I5030 Unspecified diastolic (congestive) heart failure: Secondary | ICD-10-CM | POA: Diagnosis not present

## 2017-11-20 DIAGNOSIS — E871 Hypo-osmolality and hyponatremia: Secondary | ICD-10-CM | POA: Diagnosis not present

## 2017-11-20 DIAGNOSIS — R404 Transient alteration of awareness: Secondary | ICD-10-CM | POA: Diagnosis not present

## 2017-11-20 DIAGNOSIS — I309 Acute pericarditis, unspecified: Secondary | ICD-10-CM | POA: Diagnosis not present

## 2017-11-20 DIAGNOSIS — R2689 Other abnormalities of gait and mobility: Secondary | ICD-10-CM | POA: Diagnosis not present

## 2017-11-20 DIAGNOSIS — J181 Lobar pneumonia, unspecified organism: Secondary | ICD-10-CM | POA: Diagnosis not present

## 2017-11-20 DIAGNOSIS — Z79899 Other long term (current) drug therapy: Secondary | ICD-10-CM | POA: Diagnosis not present

## 2017-11-20 DIAGNOSIS — I314 Cardiac tamponade: Secondary | ICD-10-CM | POA: Diagnosis not present

## 2017-11-20 DIAGNOSIS — M797 Fibromyalgia: Secondary | ICD-10-CM | POA: Diagnosis not present

## 2017-11-20 DIAGNOSIS — F411 Generalized anxiety disorder: Secondary | ICD-10-CM | POA: Diagnosis not present

## 2017-11-20 DIAGNOSIS — I313 Pericardial effusion (noninflammatory): Secondary | ICD-10-CM | POA: Diagnosis not present

## 2017-11-20 DIAGNOSIS — F339 Major depressive disorder, recurrent, unspecified: Secondary | ICD-10-CM | POA: Diagnosis not present

## 2017-11-20 DIAGNOSIS — M109 Gout, unspecified: Secondary | ICD-10-CM | POA: Diagnosis not present

## 2017-11-20 DIAGNOSIS — Z923 Personal history of irradiation: Secondary | ICD-10-CM | POA: Diagnosis not present

## 2017-11-20 DIAGNOSIS — G4730 Sleep apnea, unspecified: Secondary | ICD-10-CM | POA: Diagnosis not present

## 2017-11-20 DIAGNOSIS — Z886 Allergy status to analgesic agent status: Secondary | ICD-10-CM

## 2017-11-20 DIAGNOSIS — Z880 Allergy status to penicillin: Secondary | ICD-10-CM

## 2017-11-20 DIAGNOSIS — G4733 Obstructive sleep apnea (adult) (pediatric): Secondary | ICD-10-CM | POA: Diagnosis not present

## 2017-11-20 DIAGNOSIS — Z888 Allergy status to other drugs, medicaments and biological substances status: Secondary | ICD-10-CM

## 2017-11-20 DIAGNOSIS — R079 Chest pain, unspecified: Secondary | ICD-10-CM | POA: Diagnosis not present

## 2017-11-20 DIAGNOSIS — M6281 Muscle weakness (generalized): Secondary | ICD-10-CM | POA: Diagnosis not present

## 2017-11-20 DIAGNOSIS — Z9989 Dependence on other enabling machines and devices: Secondary | ICD-10-CM | POA: Diagnosis not present

## 2017-11-20 DIAGNOSIS — R0602 Shortness of breath: Secondary | ICD-10-CM | POA: Diagnosis not present

## 2017-11-20 DIAGNOSIS — I482 Chronic atrial fibrillation: Secondary | ICD-10-CM | POA: Diagnosis not present

## 2017-11-20 DIAGNOSIS — I480 Paroxysmal atrial fibrillation: Secondary | ICD-10-CM | POA: Diagnosis not present

## 2017-11-20 NOTE — Telephone Encounter (Signed)
Patient is still currently admitted at this time.

## 2017-11-20 NOTE — Progress Notes (Signed)
Progress Note  Patient Name: Selena Bush Date of Encounter: 11/20/2017  Primary Cardiologist: Dr Rockey Situ  Subjective   No cardiac complaints Waiting on SNF bed   Inpatient Medications    Scheduled Meds: . diltiazem  180 mg Oral Daily  . DULoxetine  120 mg Oral Daily  . furosemide  40 mg Oral Daily  . lisinopril  20 mg Oral Daily  . metoprolol tartrate  75 mg Oral BID  . ramelteon  8 mg Oral QHS  . sotalol  120 mg Oral Q12H   Continuous Infusions: . sodium chloride Stopped (11/14/17 2000)   PRN Meds: acetaminophen, alum & mag hydroxide-simeth, guaiFENesin-dextromethorphan   Vital Signs    Vitals:   11/19/17 2143 11/20/17 0052 11/20/17 0506 11/20/17 0515  BP:      Pulse:  65    Resp:  18    Temp: 98.8 F (37.1 C)  98.5 F (36.9 C)   TempSrc: Oral  Oral   SpO2:  95% 98%   Weight:    216 lb 14.9 oz (98.4 kg)  Height:        Intake/Output Summary (Last 24 hours) at 11/20/2017 0853 Last data filed at 11/19/2017 2100 Gross per 24 hour  Intake 680 ml  Output 200 ml  Net 480 ml   Filed Weights   11/18/17 0521 11/19/17 0433 11/20/17 0515  Weight: 216 lb 9.6 oz (98.2 kg) 219 lb 2.2 oz (99.4 kg) 216 lb 14.9 oz (98.4 kg)    Telemetry    Sinus with brief PAT; Personally Reviewed   Physical Exam   Affect appropriate Overweight white female  HEENT: normal Neck supple with no adenopathy JVP normal no bruits no thyromegaly Lungs clear with no wheezing and good diaphragmatic motion Heart:  S1/S2 no murmur, no rub, gallop or click PMI normal small incision subxiphoid from pericardiocentesis  Abdomen: benighn, BS positve, no tenderness, no AAA no bruit.  No HSM or HJR Distal pulses intact with no bruits No edema Neuro non-focal Skin warm and dry No muscular weakness    Labs    Chemistry Recent Labs  Lab 11/16/17 0414 11/17/17 0314 11/19/17 0249  NA 130* 132* 132*  K 3.7 4.2 3.7  CL 94* 95* 91*  CO2 26 28 29   GLUCOSE 138* 108* 103*  BUN 21* 16  12  CREATININE 0.84 0.74 0.77  CALCIUM 8.7* 9.0 9.0  GFRNONAA >60 >60 >60  GFRAA >60 >60 >60  ANIONGAP 10 9 12      Hematology Recent Labs  Lab 11/14/17 0242 11/15/17 0333  WBC 10.3 11.6*  RBC 4.17 3.73*  HGB 11.0* 10.2*  HCT 35.2* 31.6*  MCV 84.4 84.7  MCH 26.4 27.3  MCHC 31.3 32.3  RDW 13.6 14.1  PLT 362 420*    No results for input(s): TROPIPOC in the last 168 hours.   BNP No results for input(s): BNP, PROBNP in the last 168 hours.   DDimer  No results for input(s): DDIMER in the last 168 hours.   Patient Profile     72 year old female with past medical history of diastolic congestive heart failure, hypertension, hyperlipidemia, obstructive sleep apnea, breast cancer and recent diagnosis of atrial fibrillation with pericardial effusion with tamponade physiology.  Patient discharged March 29 following admission for atypical chest pain and paroxysmal atrial fibrillation.  Echocardiogram showed normal LV function and trivial pericardial effusion.  She was treated with sotalol and Xarelto.  Saturday, March 30 she noticed onset of dyspnea which slowly worsened.  FU echocardiogram showed large pericardial effusion with respiratory variation and dilated IVC concerning for tamponade physiology. Had pericardiocentesis 4/1.    Assessment & Plan    1 Tamponade:  Post pericardiocentesis with good resolution by TTE. Holding anticoagulation For at lest 4 weeks   2 PAF:  NSR likely related to pericarditis. No anticoagulation for at least 4 weeks due to tamponade Continue sotolol in hopes maintaining NSR   3 HTN - Well controlled.  Continue current medications and low sodium Dash type diet.    4 Productive cough-CXR 11/17/17 atelectasis per primary service  Will sign off    For questions or updates, please contact [place] Please consult [email or web address] for contact info under Cardiology/STEMI.      Signed, Jenkins Rouge, MD  11/20/2017, 8:53 AM

## 2017-11-20 NOTE — Progress Notes (Signed)
Clinical Social Worker facilitated patient discharge including contacting patient family and facility to confirm patient discharge plans.  Clinical information faxed to facility and family agreeable with plan.  CSW arranged ambulance transport via [facility] to .  RN to call [phone] (pt will go in room 148 in [facility]) for report prior to discharge.  Clinical Social Worker will sign off for now as social work intervention is no longer needed. Please consult [place] again if new need arises.  Rhea Pink, MSW, [place]

## 2017-11-20 NOTE — Telephone Encounter (Signed)
Left voicemail message to call back  

## 2017-11-20 NOTE — Clinical Social Work Placement (Signed)
   CLINICAL SOCIAL WORK PLACEMENT  NOTE  Date:  11/20/2017  Patient Details  Name: Selena Bush MRN: [id number] Date of Birth: 29-Feb-1946  Clinical Social Work is seeking post-discharge placement for this patient at the [place] level of care (*CSW will initial, date and re-position this form in  chart as items are completed):      Patient/family provided with [place] Work Department's list of facilities offering this level of care within the geographic area requested by the patient (or if unable, by the patient's family).      Patient/family informed of their freedom to choose among providers that offer the needed level of care, that participate in Medicare, Medicaid or managed care program needed by the patient, have an available bed and are willing to accept the patient.      Patient/family informed of [facility]'s ownership interest in [facility] and [facility], as well as of the fact that they are under no obligation to receive care at these facilities.  PASRR submitted to EDS on       PASRR number received on       Existing PASRR number confirmed on 11/16/17     FL2 transmitted to all facilities in geographic area requested by pt/family on 11/16/17     FL2 transmitted to all facilities within larger geographic area on       Patient informed that his/her managed care company has contracts with or will negotiate with certain facilities, including the following:            Patient/family informed of bed offers received.  Patient chooses bed at [facility]     Physician recommends and patient chooses bed at      Patient to be transferred to [facility] on 11/20/17.  Patient to be transferred to facility by [facility]     Patient family notified on 11/20/17 of transfer.  Name of family member notified:  dtr is aware     PHYSICIAN Please sign FL2     Additional Comment:     _______________________________________________ Wende Neighbors, LCSW 11/20/2017, 10:20 AM

## 2017-11-20 NOTE — NC FL2 (Signed)
[place] LEVEL OF CARE SCREENING TOOL     IDENTIFICATION  Patient Name: NADEEN SHIPMAN Birthdate: 1946/03/18 Sex: female Admission Date (Current Location): 11/12/2017  [facility] and [place] Number:  Herbalist and Address:  [facility], [place] [address] [place], [place] [address]      Provider Number: [id number]  Attending Physician Name and Address:  Axel Filler, *  Relative Name and Phone Number:  [name], son, [phone]    Current Level of Care: Hospital Recommended Level of Care: [place] Prior Approval Number:    Date Approved/Denied:   PASRR Number: [id number]  Discharge Plan: SNF    Current Diagnoses: Patient Active Problem List   Diagnosis Date Noted  . Pericardial effusion with cardiac tamponade 11/13/2017  . Shortness of breath 11/12/2017  . Atrial fibrillation with RVR ([place]) 11/05/2017  . Atypical chest pain 11/04/2017  . Anxiety 03/21/2016  . Insomnia 07/17/2015  . Hearing loss 03/09/2015  . Hypokalemia 01/21/2015  . MI (mitral incompetence) 12/10/2014  . Depression 12/09/2014  . Sleep apnea 12/09/2014  . GERD (gastroesophageal reflux disease) 12/09/2014  . Hyperglycemia 12/09/2014  . Chronic diastolic CHF (congestive heart failure) ([place])   . HLD (hyperlipidemia)   . Osteoarthritis of both knees   . S/P left TKA 08/19/2014  . Morbid obesity ([place]) 04/17/2013  . Hyponatremia 04/17/2013  . DCIS (ductal carcinoma in situ) 11/05/2012  . Essential hypertension, benign 11/05/2012    Orientation RESPIRATION BLADDER Height & Weight     Self, Time, Situation, Place  O2(2L) Continent Weight: 216 lb 14.9 oz (98.4 kg) Height:  5\' 2"  (157.5 cm)  BEHAVIORAL SYMPTOMS/MOOD NEUROLOGICAL BOWEL NUTRITION STATUS      Continent Diet(Please see DC Summary)  AMBULATORY STATUS COMMUNICATION OF NEEDS Skin   Limited Assist Verbally Normal                       Personal Care Assistance  Level of Assistance  Bathing, Feeding, Dressing Bathing Assistance: Limited assistance Feeding assistance: Independent Dressing Assistance: Limited assistance     Functional Limitations Info             [place]  PT (By licensed PT), OT (By licensed OT)     PT Frequency: 5x/week OT Frequency: 3x/week            Contractures      Additional Factors Info  Code Status, Allergies, Psychotropic Code Status Info: Full Allergies Info: Penicillins, Sulfa Antibiotics, Asa Aspirin, Codeine, Statins Psychotropic Info: Cymbalta         Current Medications (11/20/2017):  This is the current hospital active medication list Current Facility-Administered Medications  Medication Dose Route Frequency Provider Last Rate Last Dose  . 0.9 %  sodium chloride infusion   Intravenous Continuous End, Harrell Gave, MD   Stopped at 11/14/17 2000  . acetaminophen (TYLENOL) tablet 650 mg  650 mg Oral Q6H PRN Axel Filler, MD   650 mg at 11/16/17 2041  . alum & mag hydroxide-simeth (MAALOX/MYLANTA) 200-200-20 MG/5ML suspension 15 mL  15 mL Oral Q4H PRN Damian Leavell, RN   15 mL at 11/19/17 1847  . diltiazem (CARDIZEM CD) 24 hr capsule 180 mg  180 mg Oral Daily Lelon Perla, MD   180 mg at 11/20/17 1041  . DULoxetine (CYMBALTA) DR capsule 120 mg  120 mg Oral Daily End, Christopher, MD   120 mg at 11/20/17 1041  .  furosemide (LASIX) tablet 40 mg  40 mg Oral Daily Ledell Noss, MD   40 mg at 11/20/17 1041  . guaiFENesin-dextromethorphan (ROBITUSSIN DM) 100-10 MG/5ML syrup 5 mL  5 mL Oral Q4H PRN Axel Filler, MD   5 mL at 11/19/17 2358  . lisinopril (PRINIVIL,ZESTRIL) tablet 20 mg  20 mg Oral Daily Lelon Perla, MD   20 mg at 11/20/17 1041  . metoprolol tartrate (LOPRESSOR) tablet 75 mg  75 mg Oral BID End, Christopher, MD   75 mg at 11/20/17 1042  . ramelteon (ROZEREM) tablet 8 mg  8 mg Oral QHS End, Christopher, MD   8 mg at 11/19/17 2132  . sotalol  (BETAPACE) tablet 120 mg  120 mg Oral Q12H End, Christopher, MD   120 mg at 11/20/17 1041     Discharge Medications: Please see discharge summary for a list of discharge medications.  Relevant Imaging Results:  Relevant Lab Results:   Additional Information SSN: [id number] wears cpap at night, facility aware  Wende Neighbors, LCSW

## 2017-11-20 NOTE — Progress Notes (Signed)
Tachypnea and dyspnea during ambulation noted. At end of ambulation, O2 sats noted to be 86-87%, rebounded with O2 via [place] at 1L/min. Patient instructed on C&DB and proper incentive spirometry use. Patient currently IS 250 mL. Patient instructed IS goal for today is 500 mL. Patient return demonstration with inconsistent use of proper technique, needs reinforcement as needed.

## 2017-11-20 NOTE — Patient Outreach (Signed)
[facility]) Care Management  [place]   11/20/2017  FARREN LANDA 05/14/1946 [id number]  72 year old female referred to [facility] on 11/10/2017 by [facility] inpatient liaison for medication assistance with suvorexant and raloxifene.    PMHx includes, but not limited to, diastolic congestive heart failure, HTN, HLD, OSA, and breast cancer with recent hospitalization 11/04/17 - 11/10/2017 for new onset atrial fibrillation with RVR and hyponatremia, then readmitted 11/12/17 with large pericardial effusion with tamponade s/p pericardiocentesis.    Plans for discharge today to SNF.  Per discharge summary, anticoagulation on hold for at least 4 weeks.  Continued on sotalol.  Raloxifene suvorexant, sertraline, and lisinopril-HCTZ all discontinued.   Raloxifene and suvorexant are medications on consult for [facility] pharmacy services.   Call placed to [facility] inpatient liaison to review case.    Plan: [place] will close patient case at this time as she is going to SNF and raloxifene and suvorexant have been discontinued.   I am happy to assist in the future as needed.   Ralene Bathe, PharmD, [place] [phone]

## 2017-11-20 NOTE — Progress Notes (Signed)
Patient refused CPAP for HS. States its very uncomfortable. Encouraged patient to call for Respiratory if she decides to use it. Currently on 3 lpm [place].

## 2017-11-20 NOTE — Progress Notes (Signed)
   Subjective: The patient was lying in bed today upon entering the room. He cough has improved with decreasing sputum production. She continues to deny fever, chills, chest pain, dyspnea and attested to being able to ambulate a greater distance than even a month prior at home when all of her symptoms began. This appears to indicate a continued improvements in her overall condition, however, she is not fully recovered and remains in need of SNF.  Objective:  Vital signs in last 24 hours: Vitals:   11/19/17 2143 11/20/17 0052 11/20/17 0506 11/20/17 0515  BP:      Pulse:  65    Resp:  18    Temp: 98.8 F (37.1 C)  98.5 F (36.9 C)   TempSrc: Oral  Oral   SpO2:  95% 98%   Weight:    216 lb 14.9 oz (98.4 kg)  Height:       ROS negative except as per HPI.  Physical Exam: General: Well-appearing, no acute distress, conversant Neuro: Alert and oriented x4 with no focal deficits observed Cardiovascular: RRR, no murmurs rubs or gallops auscultated Pulmonary: Not in respiratory distress, bilateral wheezing upper and lower lung fields, crackles are resolved GI: Abdomen is soft, nontender, nondistended.  Assessment/Plan:  Active Problems:   Pericardial effusion with cardiac tamponade   Essential hypertension, benign   Hyponatremia   Sleep apnea   Atrial fibrillation with RVR (HCC)   Shortness of breath  Assessment: This is a 72 year old female who initially presented with atrial fibrillation with RVR and dyspnea who was found to have a pericardial effusion with tamponade physiology.  She underwent pericardiocentesis with complete resolution of her symptoms.  Repeat echocardiogram failed to reveal reaccumulation of her pericardial effusion.  She has been determined stable for discharge to SNF today.  Atrial fibrillation RVR: Normal sinus rhythm with occasional PVCs on telemetry overnight.  We will continue Cardizem and metoprolol for rate control.  We will continue sotalol for rhythm  control.  Cardiology has signed off as the patient appears stable for discharge.  Pericardial Effusion: Resolved on repeat Echo. S/p pericardiocentesis with symptoms resolution.  Continue to hold anticoagulants given risk of bleed. Will need cardiology f/up to determine continued need for anticoagulation.   HTN: BP well controlled Continue cardezem, metoprolol and sotalol   Hyponatremia: Discontinued sertraline Needs repeat BMP in 1-2 weeks  OSA: Continue CPAP nightly   Dispo: Anticipated discharge in approximately 0 day(s).   Kathi Ludwig, MD 11/20/2017, 7:33 AM Pager: Pager# [phone]

## 2017-11-20 NOTE — Progress Notes (Signed)
Physical Therapy Treatment Patient Details Name: Selena Bush MRN: [id number] DOB: 1946-07-25 Today's Date: 11/20/2017    History of Present Illness Pt is a 72 y.o. female admitted 11/12/17 with large pericardial effusion with tamponade; s/p pericardiocentesis 4/1. PMH includes HF, HTN, R breast CA, bilat TKA (2014, 2016). Of note, recent admission 3/23-3/29 for a-fib with RVR and hyponatremia.    PT Comments    Patient received in chair, pleasant but respectfully declining gait training today as she would prefer to focus on getting ready for transfer to facility, willing to participate in functional exercises however. Focused today on functional strengthening with manual resistance from PT as appropriate and tolerated, VSS throughout session today. She was left up in the chair with all needs met this morning.     Follow Up Recommendations  SNF;Supervision/Assistance - 24 hour     Equipment Recommendations  Rolling walker with 5" wheels    Recommendations for Other Services OT consult     Precautions / Restrictions Precautions Precautions: Fall Precaution Comments: Watch respiration rate Restrictions Weight Bearing Restrictions: No    Mobility  Bed Mobility               General bed mobility comments: DNT, received up in chair   Transfers Overall transfer level: Needs assistance               General transfer comment: declined moblity today due to wanting to get ready for transfer to facility   Ambulation/Gait             General Gait Details: patient declined mobility due to wanting to focus on getting ready for transfer to facility    Stairs            Wheelchair Mobility    Modified Rankin (Stroke Patients Only)       Balance Overall balance assessment: Needs assistance Sitting-balance support: No upper extremity supported;Feet supported Sitting balance-Leahy Scale: Fair     Standing balance support: No upper extremity  supported;During functional activity Standing balance-Leahy Scale: Fair Standing balance comment: can stand statically without support                             Cognition Arousal/Alertness: Awake/alert Behavior During Therapy: WFL for tasks assessed/performed Overall Cognitive Status: Within Functional Limits for tasks assessed                                        Exercises General Exercises - Lower Extremity Ankle Circles/Pumps: Both;20 reps;Seated Long Arc Quad: Both;10 reps;Seated(manual resistance from PT ) Hip ABduction/ADduction: Both;10 reps;Seated;Other (comment)(manual resistance from PT ) Hip Flexion/Marching: Both;20 reps;Seated    General Comments General comments (skin integrity, edema, etc.): VSS stable throughout session       Pertinent Vitals/Pain Pain Assessment: No/denies pain    Home Living                      Prior Function            PT Goals (current goals can now be found in the care plan section) Acute Rehab PT Goals Patient Stated Goal: Return home PT Goal Formulation: With patient Time For Goal Achievement: 11/29/17 Potential to Achieve Goals: Good Progress towards PT goals: Progressing toward goals    Frequency    Min 3X/week  PT Plan Current plan remains appropriate    Co-evaluation              AM-PAC PT "6 Clicks" Daily Activity  Outcome Measure  Difficulty turning over in bed (including adjusting bedclothes, sheets and blankets)?: A Little Difficulty moving from lying on back to sitting on the side of the bed? : A Little Difficulty sitting down on and standing up from a chair with arms (e.g., wheelchair, bedside commode, etc,.)?: A Little Help needed moving to and from a bed to chair (including a wheelchair)?: A Little Help needed walking in hospital room?: A Little Help needed climbing 3-5 steps with a railing? : A Lot 6 Click Score: 17    End of Session Equipment  Utilized During Treatment: Gait belt Activity Tolerance: Patient tolerated treatment well Patient left: in chair;with call bell/phone within reach   PT Visit Diagnosis: Other abnormalities of gait and mobility (R26.89);Muscle weakness (generalized) (M62.81)     Time: 3953-2023 PT Time Calculation (min) (ACUTE ONLY): 14 min  Charges:  $Therapeutic Exercise: 8-22 mins                    G Codes:       Deniece Ree PT, DPT, CBIS  Supplemental Physical Therapist [place]   Pager [phone]

## 2017-11-21 ENCOUNTER — Telehealth: Payer: Self-pay | Admitting: Family Medicine

## 2017-11-21 ENCOUNTER — Other Ambulatory Visit: Payer: Self-pay

## 2017-11-21 NOTE — Telephone Encounter (Signed)
Copied from [place]. Topic: Quick Communication - See Telephone Encounter >> Nov 21, 2017  3:47 PM Rutherford Nail, NT wrote: CRM for notification. See Telephone encounter for: 11/21/17. Sharyn Lull from [facility] sleep study called and stated Debbie from [facility] called her questioning the sleep study for this patient that was ordered by Dr Tamala Julian. Sharyn Lull states that patient had a sleep study done back in September 2018 and is needing to know if the sleep study is needing to be repeated? CB#:  [phone] option 4

## 2017-11-22 ENCOUNTER — Other Ambulatory Visit: Payer: Self-pay | Admitting: Licensed Clinical Social Worker

## 2017-11-22 ENCOUNTER — Ambulatory Visit: Payer: PPO | Admitting: Family Medicine

## 2017-11-22 NOTE — Telephone Encounter (Signed)
From 09/18/2017 office visit with Dr. Tamala Julian:  "OSA on CPAP: needs repeat sleep study to get new CPAP machine. Must have repeat sleep study. First sleep study was July because unable to sleep.  Since then, has been trying to get another sleep study to repeat study."  Return phone call to [facility] sleep study x2, unable to reach. Will attempt call later.

## 2017-11-22 NOTE — Patient Outreach (Signed)
[facility] ([facility]) Care Management  [facility] Social Work  11/22/2017  Selena Bush 06/27/1946 [id number]  Subjective:    Objective:   Encounter Medications:  Outpatient Encounter Medications as of 11/22/2017  Medication Sig Note  . colchicine 0.6 MG tablet Take 1 tablet (0.6 mg total) by mouth 2 (two) times daily. (Patient taking differently: Take 0.6 mg by mouth 2 (two) times daily as needed (for gout). )   . diltiazem (CARDIZEM CD) 180 MG 24 hr capsule Take 1 capsule (180 mg total) by mouth daily.   . DULoxetine (CYMBALTA) 60 MG capsule Take 120 mg by mouth daily. 11/12/2017: Pt does not remember if she took medication today. Last dose noted as yesterday.   . esomeprazole (NEXIUM) 40 MG capsule Take 1 capsule (40 mg total) by mouth every morning.   . fluticasone (FLONASE) 50 MCG/ACT nasal spray Place 2 sprays into both nostrils daily as needed for rhinitis or allergies.   . furosemide (LASIX) 40 MG tablet Take 1 tablet (40 mg total) by mouth daily.   . guaiFENesin-dextromethorphan (ROBITUSSIN DM) 100-10 MG/5ML syrup Take 5 mLs by mouth every 4 (four) hours as needed for cough.   . lisinopril (PRINIVIL,ZESTRIL) 20 MG tablet Take 1 tablet (20 mg total) by mouth daily.   . LORazepam (ATIVAN) 0.5 MG tablet Take 0.5 mg by mouth 2 (two) times daily as needed for anxiety.   . Metoprolol Tartrate 75 MG TABS Take 75 mg by mouth 2 (two) times daily.   . sotalol (BETAPACE) 120 MG tablet Take 1 tablet (120 mg total) by mouth every 12 (twelve) hours.   . tiZANidine (ZANAFLEX) 4 MG tablet TAKE 1 TABLET BY MOUTH AT SUPPER AND TAKE 1 TABLET BY MOUTH AT BEDTIME (Patient not taking: Reported on 11/12/2017)    No facility-administered encounter medications on file as of 11/22/2017.     Functional Status:  In your present state of health, do you have any difficulty performing the following activities: 11/13/2017 11/13/2017  Hearing? - N  Vision? - N  Difficulty concentrating or making decisions?  - N  Comment - -  Walking or climbing stairs? - Y  Comment - -  Dressing or bathing? - N  Doing errands, shopping? Y -  Preparing Food and eating ? - -  Using the Toilet? - -  In the past six months, have you accidently leaked urine? - -  Comment - -  Do you have problems with loss of bowel control? - -  Managing your Medications? - -  Managing your Finances? - -  Housekeeping or managing your Housekeeping? - -  Some recent data might be hidden    Fall/Depression Screening:  PHQ 2/9 Scores 10/11/2017 10/02/2017 09/18/2017 09/18/2017 05/30/2017 02/21/2017 09/13/2016  PHQ - 2 Score 0 0 0 0 0 0 0  PHQ- 9 Score - 1 - - - - -    Assessment:   CSW traveled to [facility] in [place] on 11/22/17 to visit client. CSW met with client on 11/22/17 at client's room at nursing center in [place], [place].  Client is receiving nursing care and physical therapy support at nursing center. CSW verified client identity. CSW received verbal permission from client on 11/22/17 for CSW to speak with client about client needs. Client was seated in chair and using oxygen as prescribed.  Client spoke of her physical therapy participation.  Client said she has been residing alone at her home in [place], [place].  Client said   she is using walker to help her with ambulation.  She did not mention any pain issues. She said she is eating adequately.  She said she has had some difficulty sleeping.  She said she hopes to receive physical therapy services as needed and hopes to return to her home in [place], [place] when able. She said she had been fairly independent prior to coming to nursing home. CSW gave client [facility] CSW card. CSW encouraged client to call CSW as needed to discuss social work needs of client. CSW encouraged client to talk with facility social worker to finalize client discharge plans.  CSW thanked client for allowing CSW to visit her at nursing center on 11/22/17.,      Plan:   CSW to call client in 2 weeks to  assess client needs at that time.  Michael S.Forrest MSW, LCSW Licensed Clinical Social Worker [facility] Care Management [phone]  

## 2017-11-22 NOTE — Telephone Encounter (Signed)
Patient contacted regarding discharge from [facility] on 11/19/17.   Patient understands to follow up with provider ? On 11/30/17 at 10:20am at [facility].  Patient understands discharge instructions? Yes   Patient understands medications and regiment? Yes  Patient understands to bring all medications to this visit? Yes   Patient is currently in rehab facility. She would actually like to follow up with Dr Stanford Breed in [place] instead of Dr Rockey Situ. She saw Dr Stanford Breed in the hospital while at [facility]. Routing to scheduling pool to call patient to reschedule with [facility] in [place].

## 2017-11-23 NOTE — Telephone Encounter (Signed)
Phone call to [facility] sleep study, spoke with Sharyn Lull. Sharyn Lull advised of chart note, states she verified another sleep study needed with Debbie from [facility].

## 2017-11-27 ENCOUNTER — Other Ambulatory Visit: Payer: Self-pay | Admitting: Cardiology

## 2017-11-27 ENCOUNTER — Telehealth: Payer: Self-pay | Admitting: *Deleted

## 2017-11-27 ENCOUNTER — Other Ambulatory Visit: Payer: Self-pay | Admitting: Internal Medicine

## 2017-11-27 NOTE — Telephone Encounter (Signed)
The patient had completed almost 5 years of antiestrogen therapy for her papillary DCIS.  No indication to restart.  I spoke with the patient by phone and she is making slow progress after pericardiocentesis.  We will plan to get together in February 2020 as planned, earlier if new changes are noted by the patient.

## 2017-11-27 NOTE — Telephone Encounter (Signed)
Patient called to report that she was recently seen in the ED and had to have heart surgery.   The patient states that she is under the care of Dr. [name] from cardiology.   Patient wanted to make Dr. Bary Castilla aware that the cardiologist has taken her off of the Raloxifene (note routed to Dr. Bary Castilla).   The patient reports she is doing okay otherwise just trying to recover at this point.

## 2017-11-30 ENCOUNTER — Ambulatory Visit: Payer: PPO | Admitting: Cardiovascular Disease

## 2017-11-30 NOTE — Telephone Encounter (Signed)
Follow up scheduled

## 2017-12-06 ENCOUNTER — Other Ambulatory Visit: Payer: Self-pay | Admitting: Licensed Clinical Social Worker

## 2017-12-06 NOTE — Patient Outreach (Signed)
Assessment:  Selena spoke via phone with client. Selena verified client identity. Selena received verbal permission from client on 12/06/17 for Selena to speak with client about client needs. Client said she has received nursing care and physical therapy sessions at nursing facility. Selena Bush said she will discharge from nursing facility on Friday December 08, 2017 in the morning. A family member will transport client form nursing home to client home in [place], [place]. Client said she plans to buy her own walker at local [employer]. She said she is scheduled to receive [facility], as scheduled, when she returns to her home.  Selena Selena Bush is transferring Selena care for client to Selena Bush on 12/06/17 .     Plan:  Selena Selena Bush is transferring Selena care for Selena Bush to [place] on 12/06/17  Selena Selena Bush to call [employer] LCSW to update [employer] on client status and plans.  Selena Bush to contact client in next 2 weeks to discuss client needs and status.   Selena Bush MSW, LCSW Licensed Clinical Social Worker [facility] Care Management [phone]

## 2017-12-07 ENCOUNTER — Other Ambulatory Visit: Payer: Self-pay | Admitting: Internal Medicine

## 2017-12-07 DIAGNOSIS — E871 Hypo-osmolality and hyponatremia: Secondary | ICD-10-CM | POA: Diagnosis not present

## 2017-12-07 DIAGNOSIS — I959 Hypotension, unspecified: Secondary | ICD-10-CM | POA: Diagnosis not present

## 2017-12-07 DIAGNOSIS — R55 Syncope and collapse: Secondary | ICD-10-CM | POA: Diagnosis not present

## 2017-12-07 DIAGNOSIS — I509 Heart failure, unspecified: Secondary | ICD-10-CM | POA: Diagnosis not present

## 2017-12-07 DIAGNOSIS — J9811 Atelectasis: Secondary | ICD-10-CM | POA: Diagnosis not present

## 2017-12-07 DIAGNOSIS — J168 Pneumonia due to other specified infectious organisms: Secondary | ICD-10-CM | POA: Diagnosis not present

## 2017-12-07 DIAGNOSIS — G4730 Sleep apnea, unspecified: Secondary | ICD-10-CM | POA: Diagnosis not present

## 2017-12-07 DIAGNOSIS — I110 Hypertensive heart disease with heart failure: Secondary | ICD-10-CM | POA: Diagnosis not present

## 2017-12-07 DIAGNOSIS — Z79899 Other long term (current) drug therapy: Secondary | ICD-10-CM | POA: Diagnosis not present

## 2017-12-07 DIAGNOSIS — R0603 Acute respiratory distress: Secondary | ICD-10-CM | POA: Diagnosis not present

## 2017-12-07 DIAGNOSIS — M797 Fibromyalgia: Secondary | ICD-10-CM | POA: Diagnosis not present

## 2017-12-07 DIAGNOSIS — A419 Sepsis, unspecified organism: Secondary | ICD-10-CM | POA: Diagnosis not present

## 2017-12-07 DIAGNOSIS — I480 Paroxysmal atrial fibrillation: Secondary | ICD-10-CM | POA: Diagnosis not present

## 2017-12-07 DIAGNOSIS — J189 Pneumonia, unspecified organism: Secondary | ICD-10-CM | POA: Diagnosis not present

## 2017-12-07 DIAGNOSIS — M109 Gout, unspecified: Secondary | ICD-10-CM | POA: Diagnosis not present

## 2017-12-07 DIAGNOSIS — A4189 Other specified sepsis: Secondary | ICD-10-CM | POA: Diagnosis not present

## 2017-12-07 DIAGNOSIS — Z6839 Body mass index (BMI) 39.0-39.9, adult: Secondary | ICD-10-CM | POA: Diagnosis not present

## 2017-12-07 DIAGNOSIS — F411 Generalized anxiety disorder: Secondary | ICD-10-CM | POA: Diagnosis not present

## 2017-12-07 DIAGNOSIS — K219 Gastro-esophageal reflux disease without esophagitis: Secondary | ICD-10-CM | POA: Diagnosis not present

## 2017-12-07 DIAGNOSIS — J181 Lobar pneumonia, unspecified organism: Secondary | ICD-10-CM | POA: Diagnosis not present

## 2017-12-07 DIAGNOSIS — Y95 Nosocomial condition: Secondary | ICD-10-CM | POA: Diagnosis not present

## 2017-12-08 ENCOUNTER — Telehealth: Payer: Self-pay | Admitting: Cardiology

## 2017-12-08 ENCOUNTER — Inpatient Hospital Stay (HOSPITAL_COMMUNITY)
Admission: AD | Admit: 2017-12-08 | Payer: Self-pay | Source: Other Acute Inpatient Hospital | Admitting: Internal Medicine

## 2017-12-08 DIAGNOSIS — R0989 Other specified symptoms and signs involving the circulatory and respiratory systems: Secondary | ICD-10-CM | POA: Diagnosis not present

## 2017-12-08 DIAGNOSIS — J189 Pneumonia, unspecified organism: Secondary | ICD-10-CM | POA: Diagnosis not present

## 2017-12-08 DIAGNOSIS — F329 Major depressive disorder, single episode, unspecified: Secondary | ICD-10-CM | POA: Diagnosis not present

## 2017-12-08 DIAGNOSIS — E871 Hypo-osmolality and hyponatremia: Secondary | ICD-10-CM | POA: Diagnosis not present

## 2017-12-08 DIAGNOSIS — A4189 Other specified sepsis: Secondary | ICD-10-CM | POA: Diagnosis not present

## 2017-12-08 DIAGNOSIS — J9602 Acute respiratory failure with hypercapnia: Secondary | ICD-10-CM

## 2017-12-08 DIAGNOSIS — I088 Other rheumatic multiple valve diseases: Secondary | ICD-10-CM | POA: Diagnosis not present

## 2017-12-08 DIAGNOSIS — E876 Hypokalemia: Secondary | ICD-10-CM | POA: Diagnosis not present

## 2017-12-08 DIAGNOSIS — Z79899 Other long term (current) drug therapy: Secondary | ICD-10-CM | POA: Diagnosis not present

## 2017-12-08 DIAGNOSIS — F419 Anxiety disorder, unspecified: Secondary | ICD-10-CM | POA: Diagnosis not present

## 2017-12-08 DIAGNOSIS — E7800 Pure hypercholesterolemia, unspecified: Secondary | ICD-10-CM | POA: Diagnosis not present

## 2017-12-08 DIAGNOSIS — I517 Cardiomegaly: Secondary | ICD-10-CM | POA: Diagnosis not present

## 2017-12-08 DIAGNOSIS — R269 Unspecified abnormalities of gait and mobility: Secondary | ICD-10-CM | POA: Diagnosis not present

## 2017-12-08 DIAGNOSIS — Y95 Nosocomial condition: Secondary | ICD-10-CM | POA: Diagnosis not present

## 2017-12-08 DIAGNOSIS — J156 Pneumonia due to other aerobic Gram-negative bacteria: Secondary | ICD-10-CM | POA: Diagnosis not present

## 2017-12-08 DIAGNOSIS — J9601 Acute respiratory failure with hypoxia: Secondary | ICD-10-CM | POA: Diagnosis not present

## 2017-12-08 DIAGNOSIS — F05 Delirium due to known physiological condition: Secondary | ICD-10-CM | POA: Diagnosis not present

## 2017-12-08 DIAGNOSIS — I5031 Acute diastolic (congestive) heart failure: Secondary | ICD-10-CM | POA: Diagnosis not present

## 2017-12-08 DIAGNOSIS — I5030 Unspecified diastolic (congestive) heart failure: Secondary | ICD-10-CM | POA: Diagnosis not present

## 2017-12-08 DIAGNOSIS — J9811 Atelectasis: Secondary | ICD-10-CM | POA: Diagnosis not present

## 2017-12-08 DIAGNOSIS — J168 Pneumonia due to other specified infectious organisms: Secondary | ICD-10-CM | POA: Diagnosis not present

## 2017-12-08 DIAGNOSIS — Z87891 Personal history of nicotine dependence: Secondary | ICD-10-CM | POA: Diagnosis not present

## 2017-12-08 DIAGNOSIS — I110 Hypertensive heart disease with heart failure: Secondary | ICD-10-CM | POA: Diagnosis not present

## 2017-12-08 DIAGNOSIS — J90 Pleural effusion, not elsewhere classified: Secondary | ICD-10-CM | POA: Diagnosis not present

## 2017-12-08 DIAGNOSIS — Z452 Encounter for adjustment and management of vascular access device: Secondary | ICD-10-CM | POA: Diagnosis not present

## 2017-12-08 DIAGNOSIS — Z6841 Body Mass Index (BMI) 40.0 and over, adult: Secondary | ICD-10-CM | POA: Diagnosis not present

## 2017-12-08 DIAGNOSIS — I480 Paroxysmal atrial fibrillation: Secondary | ICD-10-CM | POA: Diagnosis not present

## 2017-12-08 DIAGNOSIS — R0602 Shortness of breath: Secondary | ICD-10-CM | POA: Diagnosis not present

## 2017-12-08 DIAGNOSIS — E662 Morbid (severe) obesity with alveolar hypoventilation: Secondary | ICD-10-CM | POA: Diagnosis not present

## 2017-12-08 DIAGNOSIS — I4891 Unspecified atrial fibrillation: Secondary | ICD-10-CM | POA: Diagnosis not present

## 2017-12-08 DIAGNOSIS — R918 Other nonspecific abnormal finding of lung field: Secondary | ICD-10-CM | POA: Diagnosis not present

## 2017-12-08 DIAGNOSIS — M797 Fibromyalgia: Secondary | ICD-10-CM | POA: Diagnosis not present

## 2017-12-08 DIAGNOSIS — I10 Essential (primary) hypertension: Secondary | ICD-10-CM | POA: Diagnosis not present

## 2017-12-08 DIAGNOSIS — G4733 Obstructive sleep apnea (adult) (pediatric): Secondary | ICD-10-CM | POA: Diagnosis not present

## 2017-12-08 DIAGNOSIS — J811 Chronic pulmonary edema: Secondary | ICD-10-CM | POA: Diagnosis not present

## 2017-12-08 DIAGNOSIS — I313 Pericardial effusion (noninflammatory): Secondary | ICD-10-CM | POA: Diagnosis not present

## 2017-12-08 NOTE — Telephone Encounter (Signed)
New message   Patient's daughter Anda Kraft calling, requesting to speak with nurse for Dr Stanford Breed Following up from previous phone encounter.   Anda Kraft [phone]

## 2017-12-08 NOTE — Telephone Encounter (Signed)
Spoke with pt dtr, aware dr Stanford Breed has spoken to the physician at [facility] and we are trying to get [facility]. There are 40 people waiting on a bed currently in the system. I explained to the dtr that dr Stanford Breed has spoke with our hospital teama s well as critical care and we will do our best to get her to [facility] asap. Patient dtr voiced understanding

## 2017-12-08 NOTE — Telephone Encounter (Signed)
Spoke with pt sister, she is wanting to know an update on the bed situation. Spoke with the extender in the hospital and currently there are no beds in the ICU and no pending discharges. Encouraged the sister to speak to someone where the patient is to get her transferred to another facility.

## 2017-12-08 NOTE — Telephone Encounter (Signed)
New Message   Pt's daughter is calling because the pt is in ICU and the facility she's at does not have a cardiologist. States they are trying to find her a bed with no luck and wants to know if Stanford Breed can help. Please call

## 2017-12-11 ENCOUNTER — Other Ambulatory Visit: Payer: Self-pay | Admitting: *Deleted

## 2017-12-11 ENCOUNTER — Encounter: Payer: Self-pay | Admitting: *Deleted

## 2017-12-11 NOTE — Telephone Encounter (Signed)
This encounter was created in error - please disregard.

## 2017-12-11 NOTE — Patient Outreach (Addendum)
[place]) Care Management  12/11/2017  Selena Bush 01/21/1946 [id number]   Phone call to patient to assess for community resource needs following her discharge from [facility]. Patient to be referred to [facility] for transition of care. Voicemail message left for a return call.  Contact letter to be mailed to patient.   Late Entry: Phone call from [facility] who confirmed that patient hs now been admitted to [facility]. She is awaiting a bed at [facility].  Sheralyn Boatman [facility] Care Management [phone]

## 2017-12-12 DIAGNOSIS — J189 Pneumonia, unspecified organism: Secondary | ICD-10-CM | POA: Insufficient documentation

## 2017-12-12 DIAGNOSIS — I5030 Unspecified diastolic (congestive) heart failure: Secondary | ICD-10-CM | POA: Insufficient documentation

## 2017-12-12 DIAGNOSIS — E7800 Pure hypercholesterolemia, unspecified: Secondary | ICD-10-CM | POA: Insufficient documentation

## 2017-12-12 DIAGNOSIS — J90 Pleural effusion, not elsewhere classified: Secondary | ICD-10-CM | POA: Insufficient documentation

## 2017-12-15 ENCOUNTER — Other Ambulatory Visit: Payer: Self-pay | Admitting: *Deleted

## 2017-12-15 NOTE — Patient Outreach (Signed)
[facility]) Care Management  12/15/2017  DENENE ALAMILLO 17-Dec-1945 [id number]   Phone call to patient and patient's daughter Zenaida Deed who reported that patient is now in-patient at [facility]. Patient gave this social worker verbal permission to speak to patient's daughter.  Per patient's daughter, patient is doing much better, she is out of ICU and will be returning to [facility] post discharge from the hospital hopefully within 1 week.    Plan: Social work to provide Post Acute Visit to [facility] for follow up.   Sheralyn Boatman [facility] Care Management [phone]

## 2017-12-26 DIAGNOSIS — J9601 Acute respiratory failure with hypoxia: Secondary | ICD-10-CM | POA: Diagnosis not present

## 2017-12-26 DIAGNOSIS — I5030 Unspecified diastolic (congestive) heart failure: Secondary | ICD-10-CM | POA: Diagnosis not present

## 2017-12-26 DIAGNOSIS — R269 Unspecified abnormalities of gait and mobility: Secondary | ICD-10-CM | POA: Diagnosis not present

## 2017-12-27 DIAGNOSIS — I4891 Unspecified atrial fibrillation: Secondary | ICD-10-CM | POA: Diagnosis not present

## 2017-12-27 DIAGNOSIS — F329 Major depressive disorder, single episode, unspecified: Secondary | ICD-10-CM | POA: Diagnosis not present

## 2017-12-27 DIAGNOSIS — I5030 Unspecified diastolic (congestive) heart failure: Secondary | ICD-10-CM | POA: Diagnosis not present

## 2017-12-27 DIAGNOSIS — G4733 Obstructive sleep apnea (adult) (pediatric): Secondary | ICD-10-CM | POA: Diagnosis not present

## 2017-12-27 DIAGNOSIS — Z9981 Dependence on supplemental oxygen: Secondary | ICD-10-CM | POA: Diagnosis not present

## 2017-12-27 DIAGNOSIS — E662 Morbid (severe) obesity with alveolar hypoventilation: Secondary | ICD-10-CM | POA: Diagnosis not present

## 2017-12-27 DIAGNOSIS — I472 Ventricular tachycardia: Secondary | ICD-10-CM | POA: Diagnosis not present

## 2017-12-27 DIAGNOSIS — E7800 Pure hypercholesterolemia, unspecified: Secondary | ICD-10-CM | POA: Diagnosis not present

## 2017-12-27 DIAGNOSIS — Z6841 Body Mass Index (BMI) 40.0 and over, adult: Secondary | ICD-10-CM | POA: Diagnosis not present

## 2017-12-27 DIAGNOSIS — I110 Hypertensive heart disease with heart failure: Secondary | ICD-10-CM | POA: Diagnosis not present

## 2017-12-28 ENCOUNTER — Telehealth: Payer: Self-pay | Admitting: Family Medicine

## 2017-12-28 NOTE — Telephone Encounter (Signed)
Copied from [place] [id number]. Topic: Quick Communication - See Telephone Encounter >> Dec 28, 2017 12:35 PM Vernona Rieger wrote: CRM for notification. See Telephone encounter for: 12/28/17.  Amy Pope RN from [facility] called and needs approval for plan of care to see the patient once a week for the next five weeks for nursing care. Call back is [phone]

## 2017-12-28 NOTE — Telephone Encounter (Unsigned)
Copied from [place] [id number]. Topic: General - Other >> Dec 28, 2017  4:21 PM Maalle Stare wrote:  Marzetta Board a PT  with [facility] call to say req verbal 2 times a week for 30 weeks starting next week   Pt has a rash developing on both arms look like a med reaction and would like to know what the doctor want to do   [phone]

## 2017-12-28 NOTE — Telephone Encounter (Signed)
Called Amy at [facility] with verbal orders for nursing care 1xweek x5 weeks.

## 2017-12-29 ENCOUNTER — Other Ambulatory Visit: Payer: Self-pay

## 2017-12-29 ENCOUNTER — Telehealth: Payer: Self-pay

## 2017-12-29 ENCOUNTER — Ambulatory Visit (INDEPENDENT_AMBULATORY_CARE_PROVIDER_SITE_OTHER): Payer: PPO | Admitting: Physician Assistant

## 2017-12-29 ENCOUNTER — Other Ambulatory Visit: Payer: Self-pay | Admitting: *Deleted

## 2017-12-29 ENCOUNTER — Other Ambulatory Visit: Payer: Self-pay | Admitting: Physician Assistant

## 2017-12-29 ENCOUNTER — Encounter: Payer: Self-pay | Admitting: Physician Assistant

## 2017-12-29 ENCOUNTER — Telehealth: Payer: Self-pay | Admitting: Family Medicine

## 2017-12-29 VITALS — BP 136/86 | HR 102 | Temp 98.6°F | Resp 16 | Ht 62.0 in | Wt 202.2 lb

## 2017-12-29 DIAGNOSIS — L309 Dermatitis, unspecified: Secondary | ICD-10-CM

## 2017-12-29 DIAGNOSIS — F5104 Psychophysiologic insomnia: Secondary | ICD-10-CM | POA: Diagnosis not present

## 2017-12-29 DIAGNOSIS — F419 Anxiety disorder, unspecified: Secondary | ICD-10-CM

## 2017-12-29 DIAGNOSIS — L299 Pruritus, unspecified: Secondary | ICD-10-CM

## 2017-12-29 MED ORDER — TRIAMCINOLONE ACETONIDE 0.5 % EX OINT: 1.0000 "application " | TOPICAL_OINTMENT | Freq: Three times a day (TID) | CUTANEOUS | 1 refills | Status: DC

## 2017-12-29 MED ORDER — TRIAMCINOLONE ACETONIDE 0.5 % EX OINT: 1.0000 "application " | TOPICAL_OINTMENT | Freq: Three times a day (TID) | CUTANEOUS | 0 refills | Status: DC

## 2017-12-29 MED ORDER — HYDROXYZINE HCL 25 MG PO TABS: 25.0000 mg | ORAL_TABLET | Freq: Every evening | ORAL | 0 refills | Status: DC | PRN

## 2017-12-29 MED ORDER — TRIAMCINOLONE ACETONIDE 0.5 % EX CREA: TOPICAL_CREAM | Freq: Three times a day (TID) | CUTANEOUS | Status: DC

## 2017-12-29 NOTE — Telephone Encounter (Signed)
Advised Stacy to have pt schedule OV.

## 2017-12-29 NOTE — Telephone Encounter (Signed)
Is this okay? Please Advise

## 2017-12-29 NOTE — Telephone Encounter (Signed)
Verbal orders given  

## 2017-12-29 NOTE — Patient Instructions (Addendum)
Continue your current medications. ADD the moisturizer+steroid mixture, to the itchy places. ADD the hydroxyzine at bedtime, for itching and anxiety. It will make you sleepy.    IF you received an x-ray today, you will receive an invoice from [facility]. Please contact [facility] at [phone] with questions or concerns regarding your invoice.   IF you received labwork today, you will receive an invoice from [facility]. Please contact [facility] at [phone] with questions or concerns regarding your invoice.   Our billing staff will not be able to assist you with questions regarding bills from these companies.  You will be contacted with the lab results as soon as they are available. The fastest way to get your results is to activate your My Chart account. Instructions are located on the last page of this paperwork. If you have not heard from [place] regarding the results in 2 weeks, please contact this office.

## 2017-12-29 NOTE — Telephone Encounter (Signed)
See pharmacy request regarding the Kenalog 0.1% cream.   Thanks.  Dr. Tamala Julian saw her today (Fri) and prescribed this.

## 2017-12-29 NOTE — Telephone Encounter (Signed)
Copied from [place] [phone]. Topic: General - Other >> Dec 29, 2017 10:31 AM Yvette Rack wrote: Reason for CRM: [id number] [place], [place] - [address] calling to see if they can change the RX triamcinolone ointment (KENALOG) 0.5 % to only the cream or ointment they cant mix both together  Spoke with pharmacist.  Triamcinolone ointment cannot be mixed with Eucerin cream.  Need to change Triamcinolone to cream and mix with Eucerin cream.  Another option is mix the Triamcinolone ointment with Aquaphor ointment.  Gave order for Triamcinolone cream to mix with Eucerin cream. Sent electronically so it would be on pt's record.  To Chelle for review.

## 2017-12-29 NOTE — Telephone Encounter (Signed)
Spoke with pt to respond to need for antihistamine  Gave Chelle's message.  Pt states they will not go to [facility]. Prefers to try the Benadryl.  Advised pt it will make her very sleepy and not to try and get up during the night alone. She also states she does not like taking the Ativan, so Benadryl is her option choice.  Will send message to Jenny Reichmann for PA ASAP

## 2017-12-29 NOTE — Telephone Encounter (Signed)
Message sent to [facility] for PA ASAP

## 2017-12-29 NOTE — Patient Outreach (Signed)
[facility]) Care Management  12/29/2017  GRADIE BUTRICK 1945/09/06 [id number]   Phone call to patient to assess for social work case management needs post discharge from the SNF. Patient states that she discharged from rehab on 12/22/17 and is now home. Per patient, she has positive family support. Her daughter has arranged for a family friend to reside with her for 2 weeks. Patient also has a sister and brother in law visiting from [place] that have been staying with her and has been accompanying her to medical appointments. Patient verbalized having no transportation needs at this time.  Patient states that she has had her intake appointment with [facility] and is awaiting a call from PT and OT to schedule initial therapy appointment. Per patient, she feels that she has developed an allergic reaction to her medication. She spoke with her  doctor's office today and they are working on getting prior approval for the suggested medication to address her allergic reaction.  Patient verbalized having no social work needs at this time. All of her care needs have been met, however would like [facility] RNCM.   Plan: This social worker will inform RNCM of patient's discharge home.   Sheralyn Boatman [facility] Care Management [phone]

## 2017-12-29 NOTE — Telephone Encounter (Signed)
Copied from [place] [phone]. Topic: General - Other >> Dec 28, 2017  4:21 PM Janea Stare wrote:  Marzetta Board a PT  with [facility] call to say req verbal 2 times a week for 30 weeks starting next week   Pt has a rash developing on both arms look like a med reaction and would like to know what the doctor want to do   [phone]    >> Dec 29, 2017  4:09 PM Cleaster Corin, NT wrote: Marzetta Board a PT  with [facility] call to say req verbal 2 times a week for 3 weeks (not 30 weeks) starting next week     [phone]

## 2017-12-29 NOTE — Progress Notes (Signed)
Patient ID: Selena Bush, female    DOB: 1946/06/10, 72 y.o.   MRN: [id number]  PCP: Wardell Honour, MD  Chief Complaint  Patient presents with  . Rash    on both arms and legs started Monday, itchy     Subjective:   Presents for evaluation of itchy rash x5 days. Accompanied by her sister.  No identified exposures. Unlike drug reactions she has had previously.  Recently hospitalized with acute respiratory failure with hypoxia and hypercarbia due to community-acquired pneumonia and paroxysmal atrial fibrillation.  New medications started during her hospitalization include levofloxacin (completed course on the day the rash began), diltiazem, and buspirone (previously took this medication and tolerated it well).   Longs-standing anxiety and depression. Manifests as SOB, insomnia. Buspirone re-started to help these symptoms, but continues to struggle with sleeping. Uses CPAP for OSA. Doesn't feel like she can lie flat with the CPAP on. Desires resumption of independence (driving, more activity, etc).   Review of Systems As above.    Patient Active Problem List   Diagnosis Date Noted  . Pericardial effusion with cardiac tamponade 11/13/2017  . Shortness of breath 11/12/2017  . Atrial fibrillation with RVR ([place]) 11/05/2017  . Atypical chest pain 11/04/2017  . Anxiety 03/21/2016  . Insomnia 07/17/2015  . Hearing loss 03/09/2015  . Hypokalemia 01/21/2015  . MI (mitral incompetence) 12/10/2014  . Depression 12/09/2014  . Sleep apnea 12/09/2014  . GERD (gastroesophageal reflux disease) 12/09/2014  . Hyperglycemia 12/09/2014  . Chronic diastolic CHF (congestive heart failure) ([place])   . HLD (hyperlipidemia)   . Osteoarthritis of both knees   . S/P left TKA 08/19/2014  . Morbid obesity ([place]) 04/17/2013  . Hyponatremia 04/17/2013  . DCIS (ductal carcinoma in situ) 11/05/2012  . Essential hypertension, benign 11/05/2012     Prior to Admission medications     Medication Sig Start Date End Date Taking? Authorizing Provider  busPIRone (BUSPAR) 5 MG tablet Take by mouth. 12/19/17 01/03/18 Yes [provider]  colchicine 0.6 MG tablet Take 1 tablet (0.6 mg total) by mouth 2 (two) times daily. Patient taking differently: Take 0.6 mg by mouth 2 (two) times daily as needed (for gout).  10/11/17  Yes Wardell Honour, MD  diltiazem (CARDIZEM CD) 180 MG 24 hr capsule Take 1 capsule (180 mg total) by mouth daily. Please keep upcoming appointment for continuation of medication refills 11/27/17  Yes Lelon Perla, MD  DULoxetine (CYMBALTA) 60 MG capsule Take 120 mg by mouth daily.   Yes [provider]  esomeprazole (NEXIUM) 40 MG capsule Take 1 capsule (40 mg total) by mouth every morning. 10/11/17  Yes Wardell Honour, MD  fluticasone [facility]) 50 MCG/ACT nasal spray Place 2 sprays into both nostrils daily as needed for rhinitis or allergies. 10/11/17  Yes Wardell Honour, MD  furosemide (LASIX) 40 MG tablet Take 1 tablet (40 mg total) by mouth daily. 11/11/17  Yes Chundi, Vahini, MD  guaiFENesin-dextromethorphan (ROBITUSSIN DM) 100-10 MG/5ML syrup Take 5 mLs by mouth every 4 (four) hours as needed for cough. 11/19/17  Yes Ledell Noss, MD  lisinopril (PRINIVIL,ZESTRIL) 20 MG tablet Take 1 tablet (20 mg total) by mouth daily. 11/19/17  Yes Ledell Noss, MD  LORazepam (ATIVAN) 0.5 MG tablet Take 0.5 mg by mouth 2 (two) times daily as needed for anxiety.   Yes [provider]  Melatonin 3 MG TABS Take 5 mg by mouth.   Yes [provider]  potassium chloride SA (K-DUR,KLOR-CON) 20 MEQ tablet Take by mouth. 12/19/17  Yes [provider]  tiZANidine (ZANAFLEX) 4 MG tablet TAKE 1 TABLET BY MOUTH AT SUPPER AND TAKE 1 TABLET BY MOUTH AT BEDTIME 10/11/17  Yes Wardell Honour, MD  Docusate Sodium (DSS) 100 MG CAPS Take by mouth. 12/19/17 12/29/17  [provider]  Metoprolol Tartrate 75 MG TABS Take 75 mg by mouth 2 (two) times daily.  11/10/17 12/10/17  Lars Mage, MD  sotalol (BETAPACE) 120 MG tablet Take 1 tablet (120 mg total) by mouth every 12 (twelve) hours. 11/10/17 12/10/17  Lars Mage, MD     Allergies  Allergen Reactions  . Penicillins Anaphylaxis    anaphylaxis  Has patient had a PCN reaction causing immediate rash, facial/tongue/throat swelling, SOB or lightheadedness with hypotension: Yes Has patient had a PCN reaction causing severe rash involving mucus membranes or skin necrosis: No Has patient had a PCN reaction that required hospitalization: No Has patient had a PCN reaction occurring within the last 10 years: No If all of the above answers are "NO", then may proceed with Cephalosporin use.   . Sulfa Antibiotics Anaphylaxis  . Asa [Aspirin]     "burned my stomach intensely"  . Codeine     Gi problems   . Statins Other (See Comments)    Leg pains       Objective:  Physical Exam  Constitutional: She is oriented to person, place, and time. She appears well-developed and well-nourished. She is active and cooperative. No distress.  BP 136/86   Pulse (!) 102   Temp 98.6 F (37 C)   Resp 16   Ht 5\' 2"  (1.575 m)   Wt 202 lb 3.2 oz (91.7 kg)   SpO2 93%   BMI 36.98 kg/m    Eyes: Conjunctivae are normal.  Pulmonary/Chest: Effort normal.  Neurological: She is alert and oriented to person, place, and time.  Skin: Skin is warm, dry and intact. Rash noted. Rash is papular.  Scattered small erythematous papules on the arms and legs.  Largest concentration of lesions is on the right upper arm, just proximal to the elbow.  She is not observed scratching during her visit today though there is dermatographia on the right upper arm consistent with her reported scratching.  Psychiatric: She has a normal mood and affect. Her speech is normal and behavior is normal.       Assessment & Plan:   1. Dermatitis Most likely this is due to Levaquin, however this is not a classic drug reaction rash.  Given  her uncontrolled anxiety, insomnia and diltiazem started for atrial fibrillation, we agreed to continue BuSpar and diltiazem for now.  Trial of hydroxyzine at bedtime for anxiety/insomnia and itching.   Add one-to-one mixture of Aquaphor and topical steroid.  Stay cool and dry. - hydrOXYzine (ATARAX/VISTARIL) 25 MG tablet; Take 1-2 tablets (25-50 mg total) by mouth at bedtime as needed for anxiety or itching.  Dispense: 60 tablet; Refill: 0  2. Psychophysiological insomnia 3. Anxiety Continue BuSpar for now.  Add hydroxyzine at bedtime.    Return for re-evaluation with Dr. Tamala Julian 5/24, as planned.   Fara Chute, PA-C [facility] [place]

## 2017-12-29 NOTE — Telephone Encounter (Signed)
If hydroxyzine (Vistaril) prior authorization takes a while, options are:  1. Pay OOP for a few tablets to see how well it works. I do not know how much it would cost. [facility] may be a low cost pharmacy to try.  [facility] provides generic drugs at very low cost, and even delivers, no insurance needed. [address] [place], [place], [place] [address] [phone] or [phone] (phone) [phone] (fax) [email or web address]  2. Try OTC diphenhydramine (Benadryl) 50 mg at HS  3. Take an extra dose of lorazepam (Ativan) at HS

## 2017-12-29 NOTE — Telephone Encounter (Signed)
Message to [facility]. Spoke with pt.  The Visteril needs a PA and I attempted to let her know it may be a few days before they approve. Her sister got on the phone and let me know she needs something NOW, if not Visteril, then something.  Help?

## 2017-12-29 NOTE — Telephone Encounter (Signed)
Copied from [place] [id number]. Topic: General - Other >> Dec 29, 2017 11:05 AM Yvette Rack wrote: Reason for CRM: pt states that pharmacy want give then non of the two RX that was sent to pharmacy today pt is upset why know one responded to the first message about cream

## 2017-12-29 NOTE — Telephone Encounter (Signed)
Message to [facility]. Spoke with pt.  The Visteril needs a PA and I attempted to let her know it may be a few days before they approve. Her sister got on the phone and let me know she needs something NOW, if not Visteril, then something.  Help?

## 2017-12-30 ENCOUNTER — Other Ambulatory Visit: Payer: Self-pay | Admitting: Internal Medicine

## 2018-01-01 ENCOUNTER — Other Ambulatory Visit: Payer: Self-pay | Admitting: *Deleted

## 2018-01-01 ENCOUNTER — Encounter: Payer: Self-pay | Admitting: *Deleted

## 2018-01-01 ENCOUNTER — Other Ambulatory Visit: Payer: Self-pay | Admitting: Internal Medicine

## 2018-01-01 ENCOUNTER — Telehealth: Payer: Self-pay

## 2018-01-01 ENCOUNTER — Other Ambulatory Visit: Payer: Self-pay

## 2018-01-01 NOTE — Telephone Encounter (Signed)
PA for hydroxyzine started. [name] Key: [id number] - PA Case ID: [id number] - Rx #: [id number]

## 2018-01-01 NOTE — Patient Outreach (Addendum)
[facility]) Care Management [facility] CM Telephone Outreach, Transition of Care day [place] Telephone Outreach Care Coordination x 2  01/01/2018  AUBREY BLACKARD 02/05/1946 [id number]  Successful telephone outreach to [name], 72 y/o female referred to [facility] after 2 recent hospitalizations:  March 31- November 20, 2017 for hemorrhagic pericardial effusion with cardiac tamponade secondary to anticoagulation therapy; patient was discharged to [facility]  April 26- Dec 19, 2017 for Acute Respiratory failure with hypoxia/ hypercarbia secondary to pneumonia, and A-Fib with RVR; patient was discharged to SNF   Received referral notification on Dec 29, 2017 that patient had discharged from SNF with home health services in place.  Patient has history including, but not limited to, HTN/ HLD; CHF; A-Fib; OSA on CPAP; GERD; morbid obesity; and depression/ anxiety.  HIPAA/ identity verified with patient today during phone call; [place] services were discussed with patient, who provides verbal consent for [place] involvement in her care post-SNF discharge.  Verbally provides permission to speak with her daughter Zenaida Deed [phone]) in the future, as indicated.  Today, patient reports that she "is doing better than" she thought she would be; patient denies pain today, and she sounds to be in no obvious or apparent distress throughout our 40 minute phone call.  Patient further reports:  Medications: -- Has all medicationsand takes as prescribed;denies questions about current medications, states that she plans to discuss her medications with her PCP at upcoming office visit later this week; states that her daughter has written out a list of questions about medications that have been recently added post- hospital/ SNF visits. Patient was encouraged to physically take all of her medications to upcoming scheduled appointment, and she verbalizes  understanding and agreement, stating she will do. -- son and daughter manage medications for patient using weekly pill planner box; once in pill box, patient takes independently. -- denies issues with swallowing medications, except for "the big one," which she reports "cutting in half, which helps." -- patient declines medication review today, stating that she "just takes what her son and daughter put in the pill box;"  Discussed necessity of prompt medication review with patient, and she agrees to save post-PCP office visit instructions for review at time of [place] initial home visit, where we will review medications in person  Home health [facility]) services: -- [facility] services for PT and RN in place through [facility]; patient confirms that she has phone number for agency and is expecting call form nurse to schedule home visit "today or tomorrow." -- patient's active participation with [facility] services was encouraged  Provider appointments: -- All upcoming provider appointments were reviewed with patient today; patient reports plans to attend upcoming PCP appointment on Friday 01/05/18 -- states that she has July 2019 appointment scheduled with cardiology provider; states she wanted an earlier appointment, but none were available-- discussed with patient that I would contact cardiology provider office in an effort to facilitate scheduling a sooner appointment 1:05 pm: Care Coordination outreach:  contacted [facility] [phone]): after extensive wait on phone, spoke with Alwyn Ren, who agreed to notify practice nurse of patient's request for sooner appointment post- SNF discharge  Safety/ Mobility/ Falls: -- denies new/ recent falls; states she has not had any falls "in over a year."  Reports that she "feels steady" in ambulation "most of the time" -- assistive devices: uses cane as indicated, "most of the time;" admits there are 'times when she  feels off-balance, slightly unsteady, but this is  rare." -- general fall risks/ prevention education discussed with patient today  Holiday representative needs: -- currently denies community resource needs, stating supportive family members that assist with care needs as indicated -- reports that her daughter has arranged for a live-in caregiver for 2 weeks Meredith Mody) -- family or paid caregiver provides transportation for patient to all provider appointments, errands, etc  Advanced Directive (AD) Planning:   --reports does not currently have exisisting AD in place.  Declines wish to create either at this time  Self-health management of chronic disease state of CHF and A-Fib: -- reports that CHF "is a new diagnosis" for her -- endorses that she is restricting fluid intake to "1200 cc's per day;" denies questions around fluid restrictions, and states she "understands the amount" of daily fluid she should not go over in the course of a 24-hour period -- currently states that she does not monitor/ record daily weights; states she does not have scales in her home.  Care Coordination Outreach via secure e-mail sent to [facility] CMA to request that scales be ordered/ mailed to patient -- we briefly discussed rationale and value of monitoring and recording daily weights in self-health management of CHF; patient agrees to begin this practice when she obtains scales at her home; patient reports that she is not sure she will be able to set up scales-- encouraged her to obtain assistance from her live-in caregiver as needed, and she agrees to do so. -- weight gain guidelines in setting of CHF discussed with patient to report to care providers weight gain > 3 lbs overnight/ 5 lbs in one week -- reports sleeps in recliner, "sitting up;" states "can not lie down and breathe."  Using O2 intermittently at 2 L/min via [place] through home oxygenator; states she mainly needs O2 "with activity;" discussed with patient value of using O2 before she becomes short of breath, at the  start of activity, and any time she feels short of breath at rest; patient verbalizes understanding and agreement.  Patient denies further issues, concerns, or problems today.  I provided/ confirmed that patient has my direct phone number and the main [facility] CM office phone number should issues arise prior to next scheduled [place] outreach, with scheduled initial home visit next week.  I attempted to provide the [facility] CM 24-hour nurse advice phone number, but patient declined taking this number, stating that if she needed something urgently, she would just prefer to contact her doctor's office.  Encouraged patient to contact me directly if needs, questions, issues, or concerns arise prior to next scheduled outreach; patient agreed to do so.  Plan:  Patient will take medications as prescribed and will attend all provider appointments  Patient will promptly notify care providers for any new concerns/ issues/ problems that arise  Patient will actively participate in home health services as ordered post- SNF discharge  I will attempt to facilitate sooner scheduled cardiology appointment post- SNF discharge  I will contact [facility] to order scale delivery to patient's home  Patient will begin monitoring and recording daily weights once she receives scales at her home  I will make patient's PCP aware of [place] RN CM involvement in her care post- SNF discharge (PCP barrier letter sent)  [place] outreach for transition of care to continue with scheduled initial home visit next week  [facility] CM Care Plan Problem One     Most Recent Value  Care Plan Problem One  High risk for hospital re-admission, secondary to/ as evidenced by multiple recent hospitalizations and SNF rehabilitation visits  Role Documenting the Problem One  Care Management [place] for Problem One  Active  [facility] Long Term Goal   Over the next 31 days, patient will not experience hospital re-admission, as  evidenced by patient reporting and review of EMR during [place] RN CM outreach  Interventions for Problem One Long Term Goal  Discussed with patient [place] services,  current clinical status, upcoming provider appointments, medication management post- recent SNF discharge, and current home health services in place,  scheduled initial [place] home visit with patient for next week,  placed care coordination outreach to patient's cardiology provider in attempt to have earlier post-hospital discharge office visit arranged  [facility] CM Short Term Goal #1   Over the next 30 days, patient will actively participate in home health services as ordered post- SNF discharge, as evidenced  by patient reporting and collaboration with home health team as indicated, during [place] outreach  [facility] CM Short Term Goal #1 Start Date  01/01/18  Interventions for Short Term Goal #1  Discussed difference between home health and [place] services with patient, and encouraged her active participation with home health services,  confirmed that patient has home health agency phone numbers and has a visit scheduled for tomorrow with home health nurse  [facility] CM Short Term Goal #2   Over the next 14 days, patient will obtain new scales and will begin monitoring and recording daily weights, as evidenced by patient reporting and review of same during [place] outreach  [facility] CM Short Term Goal #2 Start Date  01/01/18  Interventions for Short Term Goal #2  Discussed with patient her current understanding of self-health management of chronic disease state of CHF,  discussed fluid restrictions and value of/ rationale for daily weights, along with weight gain guidelines,  ordered scales to be delivered to patient's home through [place]     I appreciate the opportunity to participate in [place]'s care,  Oneta Rack, RN, BSN, [employer] Coordinator [facility] Care Management  [phone]

## 2018-01-02 ENCOUNTER — Telehealth: Payer: Self-pay | Admitting: Cardiology

## 2018-01-02 MED ORDER — FUROSEMIDE 40 MG PO TABS: 40.0000 mg | ORAL_TABLET | Freq: Every day | ORAL | 0 refills | Status: DC

## 2018-01-02 MED ORDER — METOPROLOL TARTRATE 75 MG PO TABS: 75.0000 mg | ORAL_TABLET | Freq: Two times a day (BID) | ORAL | 0 refills | Status: DC

## 2018-01-02 MED ORDER — DILTIAZEM HCL ER COATED BEADS 180 MG PO CP24
180.0000 mg | ORAL_CAPSULE | Freq: Every day | ORAL | 0 refills | Status: DC
Start: 2018-01-02 — End: 2018-01-15

## 2018-01-02 MED ORDER — POTASSIUM CHLORIDE CRYS ER 20 MEQ PO TBCR
20.0000 meq | EXTENDED_RELEASE_TABLET | Freq: Every day | ORAL | 0 refills | Status: DC
Start: 2018-01-02 — End: 2018-01-13

## 2018-01-02 NOTE — Telephone Encounter (Signed)
New Message:        *STAT* If patient is at the pharmacy, call can be transferred to refill team.   1. Which medications need to be refilled? (please list name of each medication and dose if known)  potassium chloride SA (K-DUR,KLOR-CON) 20 MEQ tablet  furosemide (LASIX) 40 MG tablet  diltiazem (CARDIZEM CD) 180 MG 24 hr capsule  Metoprolol Tartrate 75 MG TABS(Expired)     2. Which pharmacy/location (including street and city if local pharmacy) is medication to be sent to?[id number] - [place], [place] - [address]  3. Do they need a 30 day or 90 day supply? 40    Pt is new to [facility] and has an appt on 01/29/18

## 2018-01-03 ENCOUNTER — Telehealth: Payer: Self-pay

## 2018-01-03 NOTE — Telephone Encounter (Signed)
PA for Hydroxyzine has been approved. Pt notified via mychart.

## 2018-01-05 ENCOUNTER — Encounter: Payer: Self-pay | Admitting: Family Medicine

## 2018-01-05 ENCOUNTER — Other Ambulatory Visit: Payer: Self-pay

## 2018-01-05 ENCOUNTER — Ambulatory Visit (INDEPENDENT_AMBULATORY_CARE_PROVIDER_SITE_OTHER): Payer: PPO | Admitting: Family Medicine

## 2018-01-05 VITALS — BP 118/70 | HR 89 | Temp 98.0°F | Resp 16 | Wt 199.0 lb

## 2018-01-05 DIAGNOSIS — I5032 Chronic diastolic (congestive) heart failure: Secondary | ICD-10-CM | POA: Diagnosis not present

## 2018-01-05 DIAGNOSIS — I313 Pericardial effusion (noninflammatory): Secondary | ICD-10-CM | POA: Diagnosis not present

## 2018-01-05 DIAGNOSIS — I340 Nonrheumatic mitral (valve) insufficiency: Secondary | ICD-10-CM | POA: Diagnosis not present

## 2018-01-05 DIAGNOSIS — I314 Cardiac tamponade: Secondary | ICD-10-CM | POA: Diagnosis not present

## 2018-01-05 DIAGNOSIS — G4733 Obstructive sleep apnea (adult) (pediatric): Secondary | ICD-10-CM

## 2018-01-05 DIAGNOSIS — E871 Hypo-osmolality and hyponatremia: Secondary | ICD-10-CM

## 2018-01-05 DIAGNOSIS — I4891 Unspecified atrial fibrillation: Secondary | ICD-10-CM

## 2018-01-05 DIAGNOSIS — I10 Essential (primary) hypertension: Secondary | ICD-10-CM | POA: Diagnosis not present

## 2018-01-05 DIAGNOSIS — I3139 Other pericardial effusion (noninflammatory): Secondary | ICD-10-CM

## 2018-01-05 DIAGNOSIS — F419 Anxiety disorder, unspecified: Secondary | ICD-10-CM | POA: Diagnosis not present

## 2018-01-05 MED ORDER — GABAPENTIN 100 MG PO CAPS: 100.0000 mg | ORAL_CAPSULE | Freq: Three times a day (TID) | ORAL | 2 refills | Status: DC

## 2018-01-05 NOTE — Progress Notes (Signed)
Subjective:    Patient ID: Selena Bush, female    DOB: February 05, 1946, 72 y.o.   MRN: [id number]  01/05/2018  Hospitalization Follow-up (pt was seen in the ER on 4/26)    HPI This 72 y.o. female presents for [place] for recent admission for pericardial effusion with cardiac tamponade.  Details of hospital admission with discharge summary includes:  Date of Admission: 11/12/2017 11:20 AM Date of Discharge: 11/19/2017 Attending Physician: Axel Filler,  Discharge Diagnosis: Active Problems:   Essential hypertension, benign   Hyponatremia   Sleep apnea   Atrial fibrillation with RVR (HCC)   Shortness of breath   Pericardial effusion with cardiac tamponade  Discharge Medications:      Allergies as of 11/19/2017      Reactions   Penicillins Anaphylaxis   anaphylaxis  Has patient had a PCN reaction causing immediate rash, facial/tongue/throat swelling, SOB or lightheadedness with hypotension: Yes Has patient had a PCN reaction causing severe rash involving mucus membranes or skin necrosis: No Has patient had a PCN reaction that required hospitalization: No Has patient had a PCN reaction occurring within the last 10 years: No If all of the above answers are "NO", then may proceed with Cephalosporin use.   Sulfa Antibiotics Anaphylaxis   Asa [aspirin]    "burned my stomach intensely"   Codeine    Gi problems   Statins Other (See Comments)   Leg pains         Medication List    STOP taking these medications   lisinopril-hydrochlorothiazide 20-12.5 MG tablet Commonly known as:  PRINZIDE,ZESTORETIC   raloxifene 60 MG tablet Commonly known as:  EVISTA   rivaroxaban 20 MG Tabs tablet Commonly known as:  XARELTO   sertraline 50 MG tablet Commonly known as:  ZOLOFT   Suvorexant 20 MG Tabs Commonly known as:  BELSOMRA     TAKE these medications   colchicine 0.6 MG tablet Take 1 tablet (0.6 mg total) by mouth 2 (two) times  daily. What changed:    when to take this  reasons to take this   CYMBALTA 60 MG capsule Generic drug:  DULoxetine Take 120 mg by mouth daily.   diltiazem 180 MG 24 hr capsule Commonly known as:  CARDIZEM CD Take 1 capsule (180 mg total) by mouth daily.   esomeprazole 40 MG capsule Commonly known as:  NEXIUM Take 1 capsule (40 mg total) by mouth every morning.   fluticasone 50 MCG/ACT nasal spray Commonly known as:  FLONASE Place 2 sprays into both nostrils daily as needed for rhinitis or allergies.   furosemide 40 MG tablet Commonly known as:  LASIX Take 1 tablet (40 mg total) by mouth daily.   guaiFENesin-dextromethorphan 100-10 MG/5ML syrup Commonly known as:  ROBITUSSIN DM Take 5 mLs by mouth every 4 (four) hours as needed for cough.   lisinopril 20 MG tablet Commonly known as:  PRINIVIL,ZESTRIL Take 1 tablet (20 mg total) by mouth daily.   LORazepam 0.5 MG tablet Commonly known as:  ATIVAN Take 0.5 mg by mouth 2 (two) times daily as needed for anxiety.   Metoprolol Tartrate 75 MG Tabs Take 75 mg by mouth 2 (two) times daily.   sotalol 120 MG tablet Commonly known as:  BETAPACE Take 1 tablet (120 mg total) by mouth every 12 (twelve) hours.   tiZANidine 4 MG tablet Commonly known as:  ZANAFLEX TAKE 1 TABLET BY MOUTH AT SUPPER AND TAKE 1 TABLET BY MOUTH AT BEDTIME  Disposition and follow-up:   Selena Bush was discharged from [facility] in Stable condition.  At the hospital follow up visit please address: 1.   A. Pericardial Tamponade: Please assess the patients dyspnea and chest pain. B. Hyponatremia: The patient attested to taking Sertraline, we could not confirm this with chart review and given her hyponatremia we opted to discontinue this due to concern for SIADH.  C. Please address her depression.  D. HTN: Stable following resolution of her Tamponade E. Holding patients Suvorexant and raloxifene due to the  risk of hypercoagulation which in conjunction with her A-fib w/o is a potential for stroke. Please discuss this with her Oncologist for risks vs benefits.  2.  Labs / imaging needed at time of follow-up: Consider Echo if dyspnea reoccurs, BMP 3.  Pending labs/ test needing follow-up: n/a  Follow-up Appointments:    Follow-up Information    Minna Merritts, MD Follow up on 11/30/2017.   Specialty:  Cardiology Why:  Please arrive 15 minutes early for your 10:20am appointment Contact information: [place] [address] [place] Hospital Course by problem list: Active Problems:   Essential hypertension, benign   Hyponatremia   Sleep apnea   Atrial fibrillation with RVR (HCC)   Shortness of breath   Pericardial effusion with cardiac tamponade   1. Dysnpea, Tamponade:  Selena Bush is a 72 year old female with a past medical history notable for hypertension, HFpEF (EF 60-65%) HLD, DCIS status post resection and radiation, atrial fibrillation, OSA, and hypokalemia who presented with fatigue and shortness of breath times 1.5 days. She stated that following discharge on 11/10/2017 she felt well at first, but worsened rapidly shortly therafter. When she was no longer able to tolerate her dyspnea she presented to the ED where there was no clear evidence of volume overload other than marked JVD.Echocardiogram on 11/05/2017 haddemonstrated the HFpEF but no notable pericardial inflammation or effusion. Repeat echocardiogram during this admission revealed a moderatelysized circumferential pericardial effusion with tamponade physiology as well as a small left-sided pleural effusion. Cardiology opted for pericardiocentesis due to the Tamponade physiology.Patient attested to theresolution of her dyspnea following the pericardiocentesis and continues to improve symptomatically with each day.Cardiology felt that herpresentation was most likely  secondary to pericarditis and with the addition of Xarelto for PA-fib she subsequently developeda hemorrhagic pericardial effusion. Her Xarelto was discontinued due to the effusion and rate control was optimized with cardiology deferring continuation of anticoagulation until outpatient follow-up.  2. Paroxysmal Atrial Fibrillation: The patient was noted to have recurrent episodes of a-fib during the admission. This is likely dueto her underlying illness and cessation of her Cardizem 2/2 hypotension. Upon reinitiationi of Cardizem and in conjunction with her improvement she was able to maintain NSR. We feel that this will continue to improve with improvement of her pericarditis/effuion and will continue rate/rhythm control. Continued sotalol, metoprolol, and cardizem.  3. Sleep Apnea: No acute issues. The patient was placed on her CPAP during the admission.  4. Hyponatremia:  The patient has been chronically hyponatremic since 2017. This is thought to be SIADH and possibly related to her SSRI. We discontinued Sertraline that was initiated on admission after convincing the patient that this could lead to an increased risk for falls, confusion and has a poor long term prognosis.  5. Depression: Stable during admission. Continued on her home Cymbalta. No adjustments indicated.   UPDATE: Had gone to son's  house for dinner; ate one pice of pizza for dinner; developed horrible abdominal pain during the night that then progressed to chest pain. Called 911 and transported to ED. Admitted; new onset atrial fibrillation; discharged home. Then the following week, felt terrible and weak and SOB.  Knew something was really wrong; felt horrible; advised to go back to hospital.  Admission for three weeks.  Diagnosed with cardiac tamponade and pericardial effusion. Sent to rehab for 3-4 weeks.  From rehab, getting ready to be discharged and that afternoon developed Atrial fibrillation with RVR.  Losing  consciousness with atrial fibrillation; sent to [facility] close to rehab; diagnosed with pneumonia.  Admitted in [place] for pnuemonia; no cardiologist in [place].  Daughter was so worried that hospitalist would restart Xarelto.  Daughter transferred patient to [facility] in [place]. Tried to find another rehab facility after pneumonia; daughter hired coworker to stay with patient for two weeks to provide care to patient. Receiving [place] physical therapy and occupational therapy and nurse that educates.  Food was horrible during admission.   Steadily losing weight because food was so horrible.  Discharged on 12/19/17. Needs more anxiety medication.  Buspar is a problem; if takes it three times per day, it makes drowsy.  Adult children are not happy. Finished Levaquin.    Very anxious about atrial fibrillation. Stopped caffeine/coffee.  Stopped everything.   Not the same person. Wants to live.  Happy with life.   Advised not to take Ativan due to development of Ativan.  Stopped psychiatrist; started Zoloft.  No appointment with psychiatrist yet.   Stopped Lisinopril, Metoprolol 200mg , Sertraline. Unable to complete sleep study; would like a home sleep study.  STARTED:  Buspar 5mg  tid Diltiazem 230mg  daily. Docusate 100mg  bid PRN. Levaquin 750mg  --- done KCL every day bid. Senakot S PRN.  CHANGED: Lasix 40mg  tid. Metoprolol 50mg  bid Sotalol 120mg  every 12 hours.  CONTINUED: Duloxetine 60mg  two daily. Flonase Lorazepam Melatonin Omeprazole 40mg  daily --- not taking. Nexium 40mg  daily. Tizanidine 4mg  at bedtime.  BP Readings from Last 3 Encounters:  01/05/18 118/70  12/29/17 136/86  11/20/17 131/78   Wt Readings from Last 3 Encounters:  01/05/18 199 lb (90.3 kg)  12/29/17 202 lb 3.2 oz (91.7 kg)  11/20/17 216 lb 14.9 oz (98.4 kg)   Immunization History  Administered Date(s) Administered  . Influenza, High Dose Seasonal PF 08/10/2014, 05/05/2015  . Influenza,inj,Quad PF,6+ Mos  06/07/2016  . Pneumococcal Conjugate-13 02/06/2016  . Pneumococcal Polysaccharide-23 09/22/2014    Review of Systems  Constitutional: Negative for activity change, appetite change, chills, diaphoresis, fatigue, fever and unexpected weight change.  HENT: Negative for congestion, dental problem, drooling, ear discharge, ear pain, facial swelling, hearing loss, mouth sores, nosebleeds, postnasal drip, rhinorrhea, sinus pressure, sneezing, sore throat, tinnitus, trouble swallowing and voice change.   Eyes: Negative for photophobia, pain, discharge, redness, itching and visual disturbance.  Respiratory: Positive for shortness of breath. Negative for apnea, cough, choking, chest tightness, wheezing and stridor.   Cardiovascular: Negative for chest pain, palpitations and leg swelling.  Gastrointestinal: Negative for abdominal distention, abdominal pain, anal bleeding, blood in stool, constipation, diarrhea, nausea, rectal pain and vomiting.  Endocrine: Negative for cold intolerance, heat intolerance, polydipsia, polyphagia and polyuria.  Genitourinary: Negative for decreased urine volume, difficulty urinating, dyspareunia, dysuria, enuresis, flank pain, frequency, genital sores, hematuria, menstrual problem, pelvic pain, urgency, vaginal bleeding, vaginal discharge and vaginal pain.  Musculoskeletal: Negative for arthralgias, back pain, gait problem, joint swelling, myalgias, neck pain  and neck stiffness.  Skin: Negative for color change, pallor, rash and wound.  Allergic/Immunologic: Negative for environmental allergies, food allergies and immunocompromised state.  Neurological: Negative for dizziness, tremors, seizures, syncope, facial asymmetry, speech difficulty, weakness, light-headedness, numbness and headaches.  Hematological: Negative for adenopathy. Does not bruise/bleed easily.  Psychiatric/Behavioral: Negative for agitation, behavioral problems, confusion, decreased concentration, dysphoric  mood, hallucinations, self-injury, sleep disturbance and suicidal ideas. The patient is not nervous/anxious and is not hyperactive.     Past Medical History:  Diagnosis Date  . Anxiety   . Atrial fibrillation ([place])   . Chronic diastolic CHF (congestive heart failure) ([place])    a. echo 09/2014: EF 06-26%, diastolic dysfunction, mild LVH, nl RV size & systolic function, mildly dilated LA (4.3 cm), mild MR/TR, mildly elevated PASP 36.7 mm Hg  . DDD (degenerative disc disease), cervical   . DDD (degenerative disc disease), lumbar   . Depression   . Diffuse cystic mastopathy 2014  . Dyspnea   . GERD (gastroesophageal reflux disease)   . Headache    rare  . HLD (hyperlipidemia)    a. statin intolerant 2/2 myalgias  . Hypercholesterolemia   . Hypertension   . Malignant neoplasm of upper-outer quadrant of female breast ([place]) 10/2012   Papillary DCIS, sentinel node negative. DR/PR positive. PARTIAL RIGHT MASTECTOMY FOR BREAST CANCER--HAD RADIATION - NO CHEMO --DR. Dewy Rose ONCOLOGIST  . Obesity   . OSA on CPAP   . Osteoarthritis of both knees    a. s/p right TKA 04/2013 & left TKA 09/2014  . Otitis externa   . Personal history of radiation therapy 2015   RIGHT breast-mammosite per pt  . Sleep difficulties    LUNESTA HAS HELPED  . Vaginal cyst    Past Surgical History:  Procedure Laterality Date  . ABDOMINAL HYSTERECTOMY  1992   DUB; fibroids; endometriosis.  One remaining ovary.    [name] BREAST LUMPECTOMY Right 2015   Papillary DCIS, sentinel node negative. DR/PR positive. PARTIAL RIGHT MASTECTOMY FOR BREAST CANCER--HAD RADIATION - NO CHEMO --DR. Lodi ONCOLOGIST  . BREAST SURGERY Right March 2014   Wide excision,APB RT 10 mm papillary DCIS, ER/PR positive. Sentinel node negative. Partial breast radiation.  [name] CATARACT EXTRACTION, BILATERAL  02/13/2016   [place].  . CHOLECYSTECTOMY  1994  . COLONOSCOPY  2015   1 benign polyp-every 5 years/ Dr Candace Cruise  . ERCP  1995  .  JOINT REPLACEMENT Right Sept 2014   knee  . PERICARDIOCENTESIS N/A 11/13/2017   Procedure: PERICARDIOCENTESIS;  Surgeon: Nelva Bush, MD;  Location: [facility];  Service: Cardiovascular;  Laterality: N/A;  . TOTAL KNEE ARTHROPLASTY Right 04/16/2013   Procedure: RIGHT TOTAL KNEE ARTHROPLASTY;  Surgeon: Mauri Pole, MD;  Location: [facility];  Service: Orthopedics;  Laterality: Right;  . TOTAL KNEE ARTHROPLASTY Left 09/15/2014   Procedure: LEFT TOTAL KNEE ARTHROPLASTY;  Surgeon: Mauri Pole, MD;  Location: [facility];  Service: Orthopedics;  Laterality: Left;  . TUBAL LIGATION  1979   Allergies  Allergen Reactions  . Penicillins Anaphylaxis    anaphylaxis  Has patient had a PCN reaction causing immediate rash, facial/tongue/throat swelling, SOB or lightheadedness with hypotension: Yes Has patient had a PCN reaction causing severe rash involving mucus membranes or skin necrosis: No Has patient had a PCN reaction that required hospitalization: No Has patient had a PCN reaction occurring within the last 10 years: No If all of the above answers are "NO", then may proceed with Cephalosporin  use.   . Sulfa Antibiotics Anaphylaxis  . Codeine Other (See Comments) and Nausea And Vomiting    Gi problems   . Statins Other (See Comments)    Leg pains  . Aspirin Other (See Comments)    "burned my stomach intensely" Abdominal pain and burning   Current Outpatient Medications on File Prior to Visit  Medication Sig Dispense Refill  . colchicine 0.6 MG tablet Take 1 tablet (0.6 mg total) by mouth 2 (two) times daily. (Patient taking differently: Take 0.6 mg by mouth 2 (two) times daily as needed (for gout). ) 30 tablet 0  . diltiazem (CARDIZEM CD) 180 MG 24 hr capsule Take 1 capsule (180 mg total) by mouth daily. Please keep upcoming appointment for continuation of medication refills 30 capsule 0  . DULoxetine (CYMBALTA) 60 MG capsule Take 120 mg by mouth daily.    [name] esomeprazole (NEXIUM) 40 MG  capsule Take 1 capsule (40 mg total) by mouth every morning. 90 capsule 3  . fluticasone (FLONASE) 50 MCG/ACT nasal spray Place 2 sprays into both nostrils daily as needed for rhinitis or allergies. 17 g 11  . furosemide (LASIX) 40 MG tablet Take 1 tablet (40 mg total) by mouth daily. 30 tablet 0  . guaiFENesin-dextromethorphan (ROBITUSSIN DM) 100-10 MG/5ML syrup Take 5 mLs by mouth every 4 (four) hours as needed for cough. 118 mL 0  . hydrOXYzine (ATARAX/VISTARIL) 25 MG tablet Take 1-2 tablets (25-50 mg total) by mouth at bedtime as needed for anxiety or itching. 60 tablet 0  . lisinopril (PRINIVIL,ZESTRIL) 20 MG tablet Take 1 tablet (20 mg total) by mouth daily. 30 tablet 0  . LORazepam (ATIVAN) 0.5 MG tablet Take 0.5 mg by mouth 2 (two) times daily as needed for anxiety.    . Melatonin 3 MG TABS Take 5 mg by mouth.    . Metoprolol Tartrate 75 MG TABS Take 75 mg by mouth 2 (two) times daily. 30 tablet 0  . potassium chloride SA (K-DUR,KLOR-CON) 20 MEQ tablet Take 1 tablet (20 mEq total) by mouth daily. 30 tablet 0  . tiZANidine (ZANAFLEX) 4 MG tablet TAKE 1 TABLET BY MOUTH AT SUPPER AND TAKE 1 TABLET BY MOUTH AT BEDTIME 180 tablet 5  . triamcinolone ointment (KENALOG) 0.1 % APPLY 1 APPLICATION TOPICALLY 3 (THREE) TIMES DAILY. 90 g 0  . triamcinolone ointment (KENALOG) 0.5 % Apply 1 application topically 3 (three) times daily. Mix 1:1 with Aquaphor 90 g 1  . sotalol (BETAPACE) 120 MG tablet Take 1 tablet (120 mg total) by mouth every 12 (twelve) hours. 60 tablet 0   No current facility-administered medications on file prior to visit.    Social History   Socioeconomic History  . Marital status: Widowed    Spouse name: Chrissie Noa  . Number of children: 2  . Years of education: College  . Highest education level: Bachelor's degree (e.g., BA, AB, BS)  Occupational History  . Occupation: Retired  Scientific laboratory technician  . Financial resource strain: Not hard at all  . Food insecurity:    Worry: Never  true    Inability: Never true  . Transportation needs:    Medical: No    Non-medical: No  Tobacco Use  . Smoking status: Never Smoker  . Smokeless tobacco: Never Used  . Tobacco comment: social smoker as a teen  Substance and Sexual Activity  . Alcohol use: No  . Drug use: No  . Sexual activity: Not Currently    Birth control/protection: Post-menopausal,  Surgical  Lifestyle  . Physical activity:    Days per week: 7 days    Minutes per session: 20 min  . Stress: Only a little  Relationships  . Social connections:    Talks on phone: More than three times a week    Gets together: More than three times a week    Attends religious service: More than 4 times per year    Active member of club or organization: Yes    Attends meetings of clubs or organizations: More than 4 times per year    Relationship status: Widowed  . Intimate partner violence:    Fear of current or ex partner: No    Emotionally abused: No    Physically abused: No    Forced sexual activity: No  Other Topics Concern  . Not on file  Social History Narrative   Marital status: widowed since 09/16/2015      Children: 2 children (51, 57); 5 grandchild (4 in [place]; 1 in [place]).      Lives: alone in townhome; 1 dog, 2 cats      Employment: psychiatric Education officer, museum; retired in 2015      Tobacco: teenager only      Alcohol:  None      Drugs: none      Exercise: walking in 2019; more active in 2019.  Walking daily small amounts.        ADLs: drives; independent with ADLs; no assistant devices      Advanced Directives: none; FULL CODE; no prolonged measures.   Does not need them.  Daughter/Heather Vista Deck is HCPOA.    Family History  Problem Relation Age of Onset  . Colon cancer Mother   . Cancer Mother 48       colon cancer  . Melanoma Father   . Cancer Father 72       melanoma  . Colon cancer Maternal Grandfather   . Breast cancer Maternal Grandfather        Objective:    BP 118/70   Pulse 89   Temp 98  F (36.7 C) (Oral)   Resp 16   Wt 199 lb (90.3 kg)   SpO2 94%   BMI 36.40 kg/m  Physical Exam  Constitutional: She is oriented to person, place, and time. She appears well-developed and well-nourished. No distress.  HENT:  Head: Normocephalic and atraumatic.  Right Ear: External ear normal.  Left Ear: External ear normal.  Nose: Nose normal.  Mouth/Throat: Oropharynx is clear and moist.  Eyes: Pupils are equal, round, and reactive to light. Conjunctivae and EOM are normal.  Neck: Normal range of motion and full passive range of motion without pain. Neck supple. No JVD present. Carotid bruit is not present. No thyromegaly present.  Cardiovascular: Normal rate, regular rhythm and normal heart sounds. Exam reveals no gallop and no friction rub.  No murmur heard. Chronic trace edema B lower extremities.  Pulmonary/Chest: Effort normal and breath sounds normal. She has no wheezes. She has no rales.  Abdominal: Soft. Bowel sounds are normal. She exhibits no distension and no mass. There is no tenderness. There is no rebound and no guarding.  Musculoskeletal: She exhibits edema.       Right shoulder: Normal.       Left shoulder: Normal.       Cervical back: Normal.  Lymphadenopathy:    She has no cervical adenopathy.  Neurological: She is alert and oriented to person, place, and time. She  has normal reflexes. No cranial nerve deficit. She exhibits normal muscle tone. Coordination normal.  Skin: Skin is warm and dry. No rash noted. She is not diaphoretic. No erythema. No pallor.  Psychiatric: She has a normal mood and affect. Her behavior is normal. Judgment and thought content normal.  Nursing note and vitals reviewed.  No results found. Depression screen [facility] 2/9 01/05/2018 12/29/2017 10/11/2017 10/02/2017 09/18/2017  Decreased Interest 0 0 0 0 0  Down, Depressed, Hopeless 0 3 0 0 0  PHQ - 2 Score 0 3 0 0 0  Altered sleeping - 3 - 0 -  Tired, decreased energy - 3 - 0 -  Change in appetite  - 3 - 0 -  Feeling bad or failure about yourself  - 2 - 1 -  Trouble concentrating - 0 - 0 -  Moving slowly or fidgety/restless - 0 - 0 -  Suicidal thoughts - 0 - 0 -  PHQ-9 Score - 14 - 1 -  Difficult doing work/chores - Not difficult at all - Not difficult at all -   Fall Risk  01/05/2018 01/01/2018 12/29/2017 10/11/2017 10/02/2017  Falls in the past year? No No No Yes Yes  Number falls in past yr: - - - 1 1  Injury with Fall? - - - - No  Risk for fall due to : - Impaired balance/gait - - Other (Comment)  Risk for fall due to: Comment - - - - slipped and fell on a object   Follow up - - - - Falls prevention discussed        Assessment & Plan:   1. Pericardial effusion with cardiac tamponade   2. Atrial fibrillation with RVR ([place])   3. Chronic diastolic CHF (congestive heart failure) ([place])   4. Essential hypertension, benign   5. Non-rheumatic mitral regurgitation   6. Anxiety   7. OSA (obstructive sleep apnea)     S/p prolonged admission for pericardial effusion with tamponade with development of atrial fibrillation with RVR.  Admission records reviewed in detail during visit; doing well at home now at discharge.  Excellent family support.  To follow-up with cardiology for management of atrial fibrillation, CHF diastolic, pericardial effusion with tamponade, mitral regurgitation.    Anxiety with depression: improved; recent admission has given patient new perspective of life and well-being; rx for Gabapentin 100mg  tid for anxiety to replace benzos.  Hyponatremia: during admission; repeat today.    Orders Placed This Encounter  Procedures  . CBC with Differential/Platelet  . Comprehensive metabolic panel  . TSH   Meds ordered this encounter  Medications  . gabapentin (NEURONTIN) 100 MG capsule    Sig: Take 1 capsule (100 mg total) by mouth 3 (three) times daily.    Dispense:  90 capsule    Refill:  2    Return in about 1 month (around 02/02/2018) for recheck  anxiety.   Travares Nelles Elayne Guerin, M.D. Primary Care at [facility] previously [facility] [address] [place], [place]  [address] [phone] phone [phone] fax

## 2018-01-05 NOTE — Patient Instructions (Addendum)
  IRMA SANTIAGO, MD   IF you received an x-ray today, you will receive an invoice from [facility]. Please contact [facility] at [phone] with questions or concerns regarding your invoice.   IF you received labwork today, you will receive an invoice from [facility]. Please contact [facility] at [phone] with questions or concerns regarding your invoice.   Our billing staff will not be able to assist you with questions regarding bills from these companies.  You will be contacted with the lab results as soon as they are available. The fastest way to get your results is to activate your My Chart account. Instructions are located on the last page of this paperwork. If you have not heard from us regarding the results in 2 weeks, please contact this office.      

## 2018-01-06 LAB — COMPREHENSIVE METABOLIC PANEL
A/G RATIO: 1.3 (ref 1.2–2.2)
ALK PHOS: 115 IU/L (ref 39–117)
ALT: 20 IU/L (ref 0–32)
AST: 20 IU/L (ref 0–40)
Albumin: 3.7 g/dL (ref 3.6–4.8)
BUN/Creatinine Ratio: 14 (ref 12–28)
BUN: 14 mg/dL (ref 8–27)
CALCIUM: 9.7 mg/dL (ref 8.7–10.3)
CHLORIDE: 93 mmol/L — AB (ref 96–106)
CO2: 29 mmol/L (ref 20–29)
Creatinine, Ser: 1.01 mg/dL — ABNORMAL HIGH (ref 0.57–1.00)
GFR calc Af Amer: 66 mL/min/{1.73_m2} (ref >59)
GFR, EST NON AFRICAN AMERICAN: 57 mL/min/{1.73_m2} — AB (ref >59)
Globulin, Total: 2.9 g/dL (ref 1.5–4.5)
Glucose: 110 mg/dL — ABNORMAL HIGH (ref 65–99)
POTASSIUM: 4.7 mmol/L (ref 3.5–5.2)
SODIUM: 136 mmol/L (ref 134–144)
Total Protein: 6.6 g/dL (ref 6.0–8.5)

## 2018-01-06 LAB — CBC WITH DIFFERENTIAL/PLATELET
BASOS: 0 %
Basophils Absolute: 0 10*3/uL (ref 0.0–0.2)
EOS (ABSOLUTE): 0.2 10*3/uL (ref 0.0–0.4)
EOS: 3 %
HEMATOCRIT: 35.4 % (ref 34.0–46.6)
Hemoglobin: 10.9 g/dL — ABNORMAL LOW (ref 11.1–15.9)
Immature Grans (Abs): 0 10*3/uL (ref 0.0–0.1)
Immature Granulocytes: 0 %
LYMPHS ABS: 1 10*3/uL (ref 0.7–3.1)
Lymphs: 13 %
MCH: 25.1 pg — AB (ref 26.6–33.0)
MCHC: 30.8 g/dL — AB (ref 31.5–35.7)
MCV: 81 fL (ref 79–97)
MONOS ABS: 0.9 10*3/uL (ref 0.1–0.9)
Monocytes: 12 %
Neutrophils Absolute: 5.3 10*3/uL (ref 1.4–7.0)
Neutrophils: 72 %
PLATELETS: 297 10*3/uL (ref 150–450)
RBC: 4.35 x10E6/uL (ref 3.77–5.28)
RDW: 15.5 % — AB (ref 12.3–15.4)
WBC: 7.4 10*3/uL (ref 3.4–10.8)

## 2018-01-06 LAB — TSH: TSH: 3.23 u[IU]/mL (ref 0.450–4.500)

## 2018-01-09 ENCOUNTER — Other Ambulatory Visit: Payer: Self-pay | Admitting: *Deleted

## 2018-01-09 ENCOUNTER — Encounter: Payer: Self-pay | Admitting: Family Medicine

## 2018-01-09 ENCOUNTER — Encounter: Payer: Self-pay | Admitting: *Deleted

## 2018-01-09 NOTE — Patient Outreach (Signed)
[facility]) Care Management [place] Telephone Outreach Care Coordination  01/09/2018  Selena Bush 16-Jul-1946 [id number]   Unsuccessful telephone outreach to [name], 72 y/o female referred to [place] after 2 recent hospitalizations:  March 31- November 20, 2017 for hemorrhagic pericardial effusion with cardiac tamponade secondary to anticoagulation therapy; patient was discharged to [facility]  April 26- Dec 19, 2017 for Acute Respiratory failure with hypoxia/ hypercarbia secondary to pneumonia, and A-Fib with RVR; patient was discharged to SNF   Received referral notification on Dec 29, 2017 that patient had discharged from SNF with home health services in place.  Patient has history including, but not limited to, HTN/ HLD; CHF; A-Fib; OSA on CPAP; GERD; morbid obesity; and depression/ anxiety.    09:35 am: Received off-hours voice mail from patient this morning, stating that she had a [name] home visit on her calendar for Saturday 01/06/18; returned patient's voicemail after receiving it, and left patient a HIPAA compliant voicemail message, clarifying that my scheduled visit to her at her home is tomorrow, Jan 10, 2018; left details of my contact information, and encouraged patient to contact me for any questions/ concerns.  Plan:  [place] initial home visit tomorrow, as previously scheduled with patient on Jan 01, 2018  Oneta Rack, RN, BSN, [facility] Care Management  [phone]

## 2018-01-10 ENCOUNTER — Other Ambulatory Visit: Payer: Self-pay | Admitting: *Deleted

## 2018-01-10 ENCOUNTER — Encounter: Payer: Self-pay | Admitting: *Deleted

## 2018-01-10 NOTE — Patient Outreach (Addendum)
[facility]) Care Management  [place] Initial Home Visit Post-SNF discharge day 12 Transition of care day 9 01/10/2018  Selena Bush 08/10/1946 [id number]  Selena Bush is an 72 y.o. female referred to [place] after 2 recent hospitalizations:  March 31- November 20, 2017 for hemorrhagic pericardial effusion with cardiac tamponade secondary to anticoagulation therapy; patient was discharged to [facility]  April 26- Dec 19, 2017 for Acute Respiratory failure with hypoxia/ hypercarbia secondary to pneumonia, and A-Fib with RVR; patient was discharged to SNF   Received referral notification on Dec 29, 2017 that patient had discharged from SNF with home health services in place.  Patient has history including, but not limited to, HTN/ HLD; CHF; A-Fib; OSA on CPAP; GERD; morbid obesity; and depression/ anxiety.  HIPAA/ identity verified with patient in person at her home today; patient's temporary live-in caregiver is present for a short amount of time but does not actively participate in home visit.  [place] services were discussed again with patient, and written consent was obtained.  HTA patient welcome letter provided to patient in person today.  Pleasant 90 minute home visit.  Subjective:  "I don't want to take any chances with my heart; I know I have to take all of this very seriously."  Assessment:  Selena Bush appears to be recuperating well after her recent hospitalizations and SNF visits for rehabilitation; she has excellent family support and denies community resources needs.  Selena Bush is adherent to taking her medications as prescribed and attending provider appointments.  Selena Bush is motivated to learn self-health management strategies for newly diagnosed atrial fibrillation and CHF.  Today, patient continues to report that she "is doing so much better" since she is now at home; patient denies pain and new/ recent falls today, and she is in no obvious or  apparent distress throughout home visit today.  Patient further reports:  Medications: -- Has all medicationsand takes as prescribed;denies questions/ concerns around current medications, states that her son and daughter fill pill box for her together, "so nothing is missed;" states that her children "go over (her) medications with a fine toothed comb."  Patient takes medications independently once they are placed in pill box by her children -- denies issues with swallowing medications-- breaks potassium in half for easier swallowing  -- discussed recent addition of gabapentin post- PCP office visit with patient; patient verbalizes accurate reporting of how to take her medications and states newly added gabapentin "is helping" -- despite that patient does not independently fill her weekly pill box, she verbalizes a good general understanding of the purpose, dosing, and scheduling of her medications; Patient was recently discharged from SNF and all medications were thoroughly reviewed with her today.  Home health [facility]) services: -- [place] services for PT and RN in place through [place]; confirmed that both disciplines are active and have been visiting; states RN visits twice weekly and PT visits twice weekly; patient endorses active participation in both disciplines  Provider appointments: -- All recent and upcoming provider appointments were reviewed with patient today; patient verbalizes accurate reporting of upcoming provider appointments and states that either her children or her temporary caregiver will provide transportation to all scheduled appointments -- confirms that she heard from cardiology office to re-schedule her SNF discharge appointment for sooner than was previously scheduled, after [facility] RN CCM placed care coordination call on 01/01/18-- states visit was "moved up by 3 weeks." -- shares today that her current  PCP will be relocating soon; we discussed options for obtaining a  new PCP, and she was encouraged to discuss with her established PCP and to begin selection process to obtain new PCP promptly  Safety/ Mobility/ Falls: -- denies new/ recent falls; again states she has not had any falls "in over a year."  Reports that she "feels steady" in ambulation without regular use of assistive devices -- no obvious fall hazards were noted in patient's home; discussed careful ambulation around home O2 tubing/ oxygenator -- general fall risks/ prevention education was provided and thoroughly discussed with patient today  Self-health management of chronic disease state of CHF and A-Fib: -- reports that A-Fib and CHF "are both new diagnosis" for her -- has not yet initiated daily weight monitoring, as she reports she has not yet received scales that were requested for delivery to her home; discussed that I would follow up on delivery status and encouraged her begin this practice promptly once she received scales at her home- patient verbalizes agreement and understanding; reiterated weight gain guidelines to report weight gain > 3 lbs overnight/ 5 lbs in one week- discussed rationale and value of daily weights in setting of CHF self-health management  -- continues restricting fluid intake to "1200 cc's per day;" denies questions around fluid restrictions, and states she "understands the amount" of daily fluid she should not go over in the course of a 24-hour period -- using teach back method, provided and discussed printed educational material on CHF and AF self-health management, along with [facility] CM recording tool; encouraged patient to frequently and thoroughly review this material; A-fib and CHF booklet provided and reviewed with patient as well -- introduced concept of AF and CHF zones and corresponding action plans using printed educational materials; these were thoroughly reviewed with patient with focus on yellow zone, using teach back method -- use of newly prescribed home O2  thoroughly discussed with patient, who verbalizes a good understanding of use of home O2; continues CPAP at night for OSA -- reports now "sleeping in bed" some; occasionally continues sleeping in recliner -- discussed value of low sodium diet, which patient states she is adherent to; printed educational material provided today -- using teach back, discussed signs/ symptoms that would necessitate urgent/ emergent follow up; patient verbalizes understanding   Patient denies further issues, concerns, or problems today. I provided/ confirmed that patient hasmy direct phone number and the main [place] office phone number should issues arise prior to next scheduled [place] outreach, with scheduled phone call next week.  I provided the [facility] CM 24-hour nurse advice phone number today as well.  Encouraged patient to contact me directly if needs, questions, issues, or concerns arise prior to next scheduled outreach; patient agreed to do so.  Objective:    BP 116/64   Pulse 74   Resp 16   Wt 199 lb (90.3 kg) Comment: PCP appt. 01/05/18  SpO2 95%   BMI 36.40 kg/m   Review of Systems  Constitutional: Negative.   Respiratory: Negative.  Negative for cough, shortness of breath and wheezing.   Cardiovascular: Negative.  Negative for chest pain, palpitations, orthopnea and leg swelling.  Gastrointestinal: Negative.  Negative for abdominal pain and nausea.  Genitourinary: Negative.  Negative for frequency and urgency.  Musculoskeletal: Negative.  Negative for falls and myalgias.  Neurological: Negative.  Negative for dizziness.  Psychiatric/Behavioral: Negative.  Negative for depression. The patient is not nervous/anxious.    Physical Exam  Constitutional: She  is oriented to person, place, and time. She appears well-developed and well-nourished. No distress.  Cardiovascular: Normal rate, regular rhythm, normal heart sounds and intact distal pulses.  Pulses:      Radial pulses are 2+ on the  right side, and 2+ on the left side.       Dorsalis pedis pulses are 2+ on the right side, and 2+ on the left side.  Respiratory: Effort normal and breath sounds normal. No respiratory distress. She has no wheezes. She has no rales.  On home O2 via oxygenator/ [place] at 2 L/min  GI: Soft. Bowel sounds are normal.  Musculoskeletal: She exhibits no edema.  Neurological: She is alert and oriented to person, place, and time.  Skin: Skin is warm and dry.  Psychiatric: She has a normal mood and affect. Her behavior is normal. Judgment and thought content normal.   Encounter Medications:   Outpatient Encounter Medications as of 01/10/2018  Medication Sig Note  . colchicine 0.6 MG tablet Take 1 tablet (0.6 mg total) by mouth 2 (two) times daily. (Patient taking differently: Take 0.6 mg by mouth 2 (two) times daily as needed (for gout). ) 01/10/2018: Takes only prn-- has not needed for several months  . diltiazem (CARDIZEM CD) 180 MG 24 hr capsule Take 1 capsule (180 mg total) by mouth daily. Please keep upcoming appointment for continuation of medication refills   . DULoxetine (CYMBALTA) 60 MG capsule Take 120 mg by mouth daily. 11/12/2017: Pt does not remember if she took medication today. Last dose noted as yesterday.   . esomeprazole (NEXIUM) 40 MG capsule Take 1 capsule (40 mg total) by mouth every morning.   . fluticasone (FLONASE) 50 MCG/ACT nasal spray Place 2 sprays into both nostrils daily as needed for rhinitis or allergies. 01/10/2018: Has not needed recently  . furosemide (LASIX) 40 MG tablet Take 1 tablet (40 mg total) by mouth daily.   [name] gabapentin (NEURONTIN) 100 MG capsule Take 1 capsule (100 mg total) by mouth 3 (three) times daily.   [name] guaiFENesin-dextromethorphan (ROBITUSSIN DM) 100-10 MG/5ML syrup Take 5 mLs by mouth every 4 (four) hours as needed for cough. 01/10/2018: Hasn't needed recently  . lisinopril (PRINIVIL,ZESTRIL) 20 MG tablet Take 1 tablet (20 mg total) by mouth daily.   .  Melatonin 3 MG TABS Take 5 mg by mouth.   . Metoprolol Tartrate 75 MG TABS Take 75 mg by mouth 2 (two) times daily.   . potassium chloride SA (K-DUR,KLOR-CON) 20 MEQ tablet Take 1 tablet (20 mEq total) by mouth daily.   [name] tiZANidine (ZANAFLEX) 4 MG tablet TAKE 1 TABLET BY MOUTH AT SUPPER AND TAKE 1 TABLET BY MOUTH AT BEDTIME   . hydrOXYzine (ATARAX/VISTARIL) 25 MG tablet Take 1-2 tablets (25-50 mg total) by mouth at bedtime as needed for anxiety or itching. (Patient not taking: Reported on 01/10/2018) 01/10/2018: Patient reports that this was replaced by gabapentin 01/05/18 during PCP office visit  . LORazepam (ATIVAN) 0.5 MG tablet Take 0.5 mg by mouth 2 (two) times daily as needed for anxiety. 01/10/2018: Reports discontinued by PCP 01/05/18  . sotalol (BETAPACE) 120 MG tablet Take 1 tablet (120 mg total) by mouth every 12 (twelve) hours.   . triamcinolone ointment (KENALOG) 0.1 % APPLY 1 APPLICATION TOPICALLY 3 (THREE) TIMES DAILY. (Patient not taking: Reported on 01/10/2018) 01/10/2018: Reports not using currently-- "rash cleared up"  . triamcinolone ointment (KENALOG) 0.5 % Apply 1 application topically 3 (three) times daily. Mix 1:1 with Aquaphor (  Patient not taking: Reported on 01/10/2018)    No facility-administered encounter medications on file as of 01/10/2018.    Functional Status:   In your present state of health, do you have any difficulty performing the following activities: 01/10/2018 01/01/2018  Hearing? N -  Vision? N -  Difficulty concentrating or making decisions? N -  Comment - -  Walking or climbing stairs? Y Y  Comment "I could if I absolutley had to -- but I need to get stronger" -  Dressing or bathing? N Y  Comment - per patient report, requires minimal assistance with ADL's  Doing errands, shopping? Y Y  Comment - Per patient report today, patient's family provides transportation to all provider appointments and errands  Preparing Food and eating ? N -  Using the Toilet? N -   In the past six months, have you accidently leaked urine? N -  Comment - -  Do you have problems with loss of bowel control? N -  Managing your Medications? - Y  Comment - Per patient report today, family assists  Managing your Finances? - Y  Comment - Per patient report today, family assists  Housekeeping or managing your Housekeeping? - Y  Comment - Per patient report today, family assists  Some recent data might be hidden   Fall/Depression Screening:    Fall Risk  01/10/2018 01/05/2018 01/01/2018  Falls in the past year? No No No  Number falls in past yr: - - -  Injury with Fall? - - -  Risk for fall due to : Impaired balance/gait - Impaired balance/gait  Risk for fall due to: Comment "At time"-- fall risks and prevention education provided - -  Follow up - - -   PHQ 2/9 Scores 01/10/2018 01/05/2018 12/29/2017 10/11/2017 10/02/2017 09/18/2017 09/18/2017  PHQ - 2 Score 0 0 3 0 0 0 0  PHQ- 9 Score - - 14 - 1 - -   Plan:  Patient will take medications as prescribed and will attend all provider appointments  Patient will promptly notify care providers for any new concerns/ issues/ problems that arise  Patient will actively participate in home health services as ordered post- SNF discharge  I will re-contact [facility] CMA to follow up on previously ordered scales for delivery to patient's home  Patient will begin monitoring and recording daily weights once she receives scales at her home  Patient will review printed educational material provided to her today on self-health management of CHF and A-fib  I will make share today's initial home visit notes and care plan with patient's PCP   [facility] CM outreach for transition of care to continue with scheduled phone call next week  [facility] CM Care Plan Problem One     Most Recent Value  Care Plan Problem One  High risk for hospital re-admission, secondary to/ as evidenced by multiple recent hospitalizations and SNF rehabilitation visits   Role Documenting the Problem One  Care Management Coordinator  Care Plan for Problem One  Active  [facility] Long Term Goal   Over the next 31 days, patient will not experience hospital re-admission, as evidenced by patient reporting and review of EMR during [place] RN CM outreach  [facility] Long Term Goal Start Date  01/01/18  Interventions for Problem One Long Term Goal  [facility] Community RN CM Initial home visit completed with physical assessment, medication review, review of provider appointments, discussion around steps to take to obtain new PCP once her current PCP  relocates,  education provided on signs/ symptoms that would necessitate urgent/ emergent follow up  [facility] CM Short Term Goal #1   Over the next 30 days, patient will actively participate in home health services as ordered post- SNF discharge, as evidenced  by patient reporting and collaboration with home health team as indicated, during [place] outreach  [facility] CM Short Term Goal #1 Start Date  01/01/18  Interventions for Short Term Goal #1  Discussed with patient current home health services in place and encouraged patient to continue actively participating in all home health discipline services  [facility] CM Short Term Goal #2   Over the next 14 days, patient will obtain new scales and will begin monitoring and recording daily weights, as evidenced by patient reporting and review of same during [place] outreach  [facility] CM Short Term Goal #2 Start Date  01/01/18  Interventions for Short Term Goal #2  Discussed that patient has not yet received scales ordered last week for delivery to her home,  placed care coordination outreach to follow up on previously requested delivery    [facility] CM Care Plan Problem Two     Most Recent Value  Care Plan Problem Two  Knowledge deficit related to new diagnosis' of CHF and A-fib, as evidenced by patient reporitng of same  Role Documenting the Problem Two  Care Management Coordinator  Care Plan for Problem  Two  Active  Interventions for Problem Two Long Term Goal   Using teachback method, provided and discussed printed EMMI educational material and [facility] monitoring tool for self-health management of AF and CHF,  discussed patient's current understanding of new diagnosis' and presented concept of CHF/ AF zones and action plans,  encouraged patient to thoroughly and frequently review printed education material provided to her today  [facility] Long Term Goal  Over the next 51 days, patient will be able to verbalize signs/ symptoms of Af and CHF yellow zone, along with corresponding action plans, as evidenced by patient reporting during [place] RN CM outreach  [facility] Long Term Goal Start Date  01/10/18     Oneta Rack, RN, BSN, [place] Coordinator [facility] Care Management  [phone]

## 2018-01-13 ENCOUNTER — Other Ambulatory Visit: Payer: Self-pay | Admitting: Cardiology

## 2018-01-15 ENCOUNTER — Telehealth: Payer: Self-pay | Admitting: Cardiology

## 2018-01-15 MED ORDER — METOPROLOL TARTRATE 75 MG PO TABS: 75.0000 mg | ORAL_TABLET | Freq: Two times a day (BID) | ORAL | 0 refills | Status: DC

## 2018-01-15 MED ORDER — POTASSIUM CHLORIDE CRYS ER 20 MEQ PO TBCR: 40.0000 meq | EXTENDED_RELEASE_TABLET | Freq: Two times a day (BID) | ORAL | 2 refills | Status: DC

## 2018-01-15 MED ORDER — DILTIAZEM HCL ER COATED BEADS 180 MG PO CP24: 180.0000 mg | ORAL_CAPSULE | Freq: Every day | ORAL | 0 refills | Status: DC

## 2018-01-15 NOTE — Telephone Encounter (Signed)
New message    Please call she has question on the  diltiazem (CARDIZEM CD) 180 MG 24 hr capsule Take 1 capsule (180 mg total) by mouth daily. Please keep upcoming appointment for continuation of medication refills      KLOR-CON M20 20 MEQ tablet TAKE 1 TABLET BY MOUTH EVERY DAY

## 2018-01-15 NOTE — Telephone Encounter (Signed)
Spoke with pt, medication dosage and use discussed with the patient. Refill sent to the pharmacy electronically.

## 2018-01-16 ENCOUNTER — Other Ambulatory Visit: Payer: Self-pay | Admitting: Cardiology

## 2018-01-18 ENCOUNTER — Encounter: Payer: Self-pay | Admitting: *Deleted

## 2018-01-18 ENCOUNTER — Other Ambulatory Visit: Payer: Self-pay | Admitting: *Deleted

## 2018-01-18 NOTE — Patient Outreach (Addendum)
[place] [facility]) Care Management [place] Telephone Outreach, Transition of Care day 17  01/18/2018  Selena Bush [birth year] [id number]  Successful telephone outreach to Selena Bush is an 72 y.o. female referred to [place] after 2 recent hospitalizations:  March 31- April 8, 2018fr hemorrhagic pericardial effusion with cardiac tamponade secondary to anticoagulation therapy; [facility] Acute Respiratory failure with hypoxia/ hypercarbia secondary to pneumonia, and A-Fib with RVR; patient was discharged to SNF   Receivedreferralnotification on Dec 29, 2017 that patient had discharged from SNF with home health services in place.Patient has history including, but not limited to, HTN/ HLD; CHF; A-Fib; OSA on CPAP; GERD; morbid obesity; and depression/ anxiety.HIPAA/ identity verified with patient during phone call today.  Today, patient continues to report that she "is doing great" since SNF discharge; reports that she "can tell that" she has "more energy," and "every day is getting better."  Patient denies new/ recent falls today, and she sounds to be in no obvious or apparent distress throughout phone call today.  Patient further reports:  -- Has all medicationsand takes as prescribed;denies questions/ concerns around current medications, states that her son and daughter continue to fill pill box for her and she takes medications independently once they are placed in pill box. -- [facility] ([facility] [facility] PT "signed off" yesterday; [facility]; states HHHindmanurrently visiting "once a week."  Endorses active participation with all [facility]-- positive reinforcement provided. -- All upcoming provider appointments were reviewed with patient today; patient verbalizes accurate reporting of upcoming provider appointments and  states that her temporary caregiver will provide transportation to scheduled new cardiology provider office visit scheduled for January 29, 2018 -- has taken steps to obtain new PCP, as she previously reported that her current PCP will be relocating soon; positive reinforcement provided for her prompt follow up in obtaining new PCP:  Patient confirms that she has office visit scheduled with new PCP on March 16, 2018 ([facility]health management ofchronic disease state of CHF and A-Fib: -- confirms that she has obtained new scales and has set them up; patient declines sharing her weight with me today, stating that she "doesn't tell anyone what" she weighs-- I encouraged her, sharing that I would like to review her weights, but she continues to decline sharing with me; I re-iterated previously provided education regarding weight gain guidelines and signs/ symptoms yellow zone of CHF and AF; patient is able to discuss both and reports that she has reviewed and understands CHF/ AF zones with corresponding action plans -- continues restricting fluid intake to "1200 cc's per day;"  -- use of recently prescribed home O2 thoroughly discussed with patient, who verbalizes a good understanding of use of home O2 and reports she is now only having to use "as needed;" continues CPAP at night for OSA  Patient denies further issues, concerns, or problems today. Iconfirmed that patient hasmy direct phone number, the [facility] 24-hour nurse advice phone number, andthe main THN CM office phone number should issues arise prior to next scheduled TH[place]reach, with phone call next week by a [place] CM co-worker.Patient verbalized agreement with this plan.  Plan:  Patient will take medications as prescribed and will attend all provider appointments  Patient will promptly notify care providers  for any new concerns/ issues/ problems that arise  Patient will  actively participate in home health services as ordered post- SNF discharge  Patient will continue monitoring and recording daily weights and will report weight gain > 3 lbs overnight/ 5 lbs in one week to care providers  Patient will continue reviewing previously provided educational material on self-health management of CHF and A-fib as indicated  [facility] Community CM outreach for transition of care to continue with phone call next week  [facility] CM Care Plan Problem One     Most Recent Value  Care Plan Problem One  High risk for hospital re-admission, secondary to/ as evidenced by multiple recent hospitalizations and SNF rehabilitation visits  Role Documenting the Problem One  Care Management Coordinator  Care Plan for Problem One  Active  THN Long Term Goal   Over the next 31 days, patient will not experience hospital re-admission, as evidenced by patient reporting and review of EMR during [place] RN CM outreach  [place] Term Goal Start Date  01/01/18  Interventions for Problem One Long Term Goal  Discussed with patient current clinical status, use of home O2, reviewed upcoming provider appointments with patient and confirmed that she has transportation to scheduled provider appointments  THN CM Short Term Goal #1   Over the next 30 days, patient will actively participate in home health services as ordered post- SNF discharge, as evidenced  by patient reporting and collaboration with home health team as indicated, during [facility] RN CCM outreach  [facility] CM Short Term Goal #1 Start Date  01/01/18 01/18/18:  Goal met-- confirmed that patient has obtained new scales and has initiated daily weight monitoring and recording  Interventions for Short Term Goal #1  Discussed recent home health visits with patient and provided positive reinforcement for her active participation in all [facility] disciplines  THN CM Short Term Goal #2   Over the next 14 days, patient will obtain new scales and will begin monitoring and  recording daily weights, as evidenced by patient reporting and review of same during [place] outreach  [facility] CM Short Term Goal #2 Start Date  01/01/18    [facility] CM Care Plan Problem Two     Most Recent Value  Care Plan Problem Two  Knowledge deficit related to new diagnosis' of CHF and A-fib, as evidenced by patient reporitng of same  Role Documenting the Problem Two  Care Management Coordinator  Care Plan for Problem Two  Active  Interventions for Problem Two Long Term Goal   Reiterated signs/ symptoms of yellow zone for AF and CHF with patient and confirmed that patient is in green zone and has not experienced concerning signs/ symptoms of yellow zone  THN Long Term Goal  Over the next 51 days, patient will be able to verbalize signs/ symptoms of AF and CHF yellow zone, along with corresponding action plans, as evidenced by patient reporting during [place] CM outreach  [facility] Long Term Goal Start Date  01/10/18  THN CM Short Term Goal #1   Over the next 30 days, patient will monitor and record daily weights, as evidenced by patient reporting and review of same during [place] RN CM outreach  [facility] CM Short Term Goal #1 Start Date  01/18/18  Interventions for Short Term Goal #2   Confirmed that patient received new scales and has begun monitoring and recording daily weights,  reiterated weight gain guidelines in setting of CHF to report  weight gain greater than 3 lbs overnight/ 5 lbs in one week     Oneta Rack, RN, BSN, [place] Care Management  [phone]

## 2018-01-19 ENCOUNTER — Telehealth: Payer: Self-pay | Admitting: Family Medicine

## 2018-01-19 NOTE — Telephone Encounter (Signed)
[facility] sent over prescription to complete for billing the insurance company for her CPAP supplies.  They have not received it and are telling her if they don't she will have to pay for it.  Please call her at [phone] when the form is completed.

## 2018-01-21 NOTE — Telephone Encounter (Signed)
Form signed and placed in fax box last week.  Please call [facility] to confirm receipt of orders.

## 2018-01-22 ENCOUNTER — Other Ambulatory Visit: Payer: Self-pay

## 2018-01-22 NOTE — Patient Outreach (Signed)
[facility]) Care Management  01/22/2018  Selena Bush [birth year] [id number]   72 y.o.female2 recent admission and recent discharge from skilled facility. Per chart History of HTN, HLD, heart failure, atrial fibrillation, OSA/CPAP, GERD, morbid obesity, depression/anxiety.  RNCM called to follow up. Two patient identifiers confirmed. Client reports she is doing well. She states she was discharged from home health today because she is doing so well. She denies any questions or concerns.  RNCM provided contact number and encouraged to call as needed. Client also reports she has the 24 hour nurse advice line number.  Plan: update assigned care coordinator.  Covering RNCM: Thea Silversmith, RN, MSN, [place] Coordinator Cell: [phone]

## 2018-01-23 NOTE — Telephone Encounter (Signed)
Phone call to [place]. Selena Bush has received CPAP orders for patient, will be reaching out to her soon to finalize details and delivery.   Phone call to patient. Unable to reach. If patient calls back, please advise her of the above.

## 2018-01-26 DIAGNOSIS — I5030 Unspecified diastolic (congestive) heart failure: Secondary | ICD-10-CM | POA: Diagnosis not present

## 2018-01-26 DIAGNOSIS — J9601 Acute respiratory failure with hypoxia: Secondary | ICD-10-CM | POA: Diagnosis not present

## 2018-01-26 DIAGNOSIS — R269 Unspecified abnormalities of gait and mobility: Secondary | ICD-10-CM | POA: Diagnosis not present

## 2018-01-29 ENCOUNTER — Ambulatory Visit (INDEPENDENT_AMBULATORY_CARE_PROVIDER_SITE_OTHER): Payer: PPO | Admitting: Cardiology

## 2018-01-29 ENCOUNTER — Encounter: Payer: Self-pay | Admitting: Cardiology

## 2018-01-29 VITALS — BP 108/70 | HR 71 | Ht 62.0 in | Wt 201.0 lb

## 2018-01-29 DIAGNOSIS — I10 Essential (primary) hypertension: Secondary | ICD-10-CM | POA: Diagnosis not present

## 2018-01-29 DIAGNOSIS — I5032 Chronic diastolic (congestive) heart failure: Secondary | ICD-10-CM | POA: Diagnosis not present

## 2018-01-29 DIAGNOSIS — I110 Hypertensive heart disease with heart failure: Secondary | ICD-10-CM

## 2018-01-29 DIAGNOSIS — I3139 Other pericardial effusion (noninflammatory): Secondary | ICD-10-CM

## 2018-01-29 DIAGNOSIS — I314 Cardiac tamponade: Secondary | ICD-10-CM | POA: Diagnosis not present

## 2018-01-29 DIAGNOSIS — I313 Pericardial effusion (noninflammatory): Secondary | ICD-10-CM

## 2018-01-29 DIAGNOSIS — I480 Paroxysmal atrial fibrillation: Secondary | ICD-10-CM

## 2018-01-29 NOTE — Patient Instructions (Signed)
Your physician recommends that you schedule a follow-up appointment in: 3 MONTHS WITH DR CRENSHAW  If you need a refill on your cardiac medications before your next appointment, please call your pharmacy.   

## 2018-01-29 NOTE — Progress Notes (Signed)
HPI: Follow-up diastolic congestive heart failure, paroxysmal atrial fibrillation, pericardial effusion, hypertension and hyperlipidemia.  Patient was admitted March 2019 with atypical chest pain and paroxysmal atrial fibrillation.  She was treated with sotalol and Xarelto.  However she returned with significant dyspnea and follow-up echocardiogram showed large pericardial effusion with tamponade physiology.  She had pericardiocentesis on April 1.  Cytology was negative.  At that time we felt it was likely her initial presentation was pericarditis and she had a hemorrhagic pericardial effusion from initiation of anticoagulation.  We felt her atrial fibrillation may improve once pericardial process resolved.  Patient was admitted with pneumonia and recurrent atrial fibrilllation to an outside hospital.  Follow-up echocardiogram April 2019 showed normal LV function, moderate left ventricular hypertrophy, mild tricuspid regurgitation, small pericardial effusion and small pleural effusion.  Patient treated with antibiotics with improvement.  Also treated with Lasix.  Since last seen patient denies dyspnea, chest pain or syncope.  She thinks that she has had 2 separate episodes of atrial fibrillation lasting several hours.  They are associated with increased dyspnea and decreased energy.  Current Outpatient Medications  Medication Sig Dispense Refill  . colchicine 0.6 MG tablet Take 1 tablet (0.6 mg total) by mouth 2 (two) times daily. (Patient taking differently: Take 0.6 mg by mouth 2 (two) times daily as needed (for gout). ) 30 tablet 0  . diltiazem (CARDIZEM CD) 180 MG 24 hr capsule Take 1 capsule (180 mg total) by mouth daily. Please keep upcoming appointment for continuation of medication refills 30 capsule 0  . DULoxetine (CYMBALTA) 60 MG capsule Take 60 mg by mouth 2 (two) times daily.     [name] esomeprazole (NEXIUM) 40 MG capsule Take 1 capsule (40 mg total) by mouth every morning. (Patient taking  differently: Take 80 mg by mouth every morning. ) 90 capsule 3  . fluticasone (FLONASE) 50 MCG/ACT nasal spray Place 2 sprays into both nostrils daily as needed for rhinitis or allergies. 17 g 11  . furosemide (LASIX) 40 MG tablet Take 1 tablet (40 mg total) by mouth daily. 30 tablet 0  . gabapentin (NEURONTIN) 100 MG capsule Take 1 capsule (100 mg total) by mouth 3 (three) times daily. 90 capsule 2  . guaiFENesin-dextromethorphan (ROBITUSSIN DM) 100-10 MG/5ML syrup Take 5 mLs by mouth every 4 (four) hours as needed for cough. 118 mL 0  . Melatonin 3 MG TABS Take 5 mg by mouth.    . metoprolol tartrate (LOPRESSOR) 25 MG tablet TAKE 3 TABLETS (75 MG) BY MOUTH 2 (TWO) TIMES DAILY. 90 tablet 0  . potassium chloride SA (KLOR-CON M20) 20 MEQ tablet Take 2 tablets (40 mEq total) by mouth 2 (two) times daily. (Patient taking differently: Take 20 mEq by mouth daily. ) 120 tablet 2  . tiZANidine (ZANAFLEX) 4 MG tablet TAKE 1 TABLET BY MOUTH AT SUPPER AND TAKE 1 TABLET BY MOUTH AT BEDTIME 180 tablet 5  . sotalol (BETAPACE) 120 MG tablet Take 1 tablet (120 mg total) by mouth every 12 (twelve) hours. 60 tablet 0   No current facility-administered medications for this visit.      Past Medical History:  Diagnosis Date  . Anxiety   . Atrial fibrillation ([place])   . Chronic diastolic CHF (congestive heart failure) ([place])    a. echo 09/2014: EF 01-09%, diastolic dysfunction, mild LVH, nl RV size & systolic function, mildly dilated LA (4.3 cm), mild MR/TR, mildly elevated PASP 36.7 mm Hg  . DDD (degenerative  disc disease), cervical   . DDD (degenerative disc disease), lumbar   . Depression   . Diffuse cystic mastopathy 2014  . Dyspnea   . GERD (gastroesophageal reflux disease)   . Headache    rare  . HLD (hyperlipidemia)    a. statin intolerant 2/2 myalgias  . Hypercholesterolemia   . Hypertension   . Malignant neoplasm of upper-outer quadrant of female breast ([place]) 10/2012   Papillary DCIS, sentinel node  negative. DR/PR positive. PARTIAL RIGHT MASTECTOMY FOR BREAST CANCER--HAD RADIATION - NO CHEMO --DR. Kincaid ONCOLOGIST  . Obesity   . OSA on CPAP   . Osteoarthritis of both knees    a. s/p right TKA 04/2013 & left TKA 09/2014  . Otitis externa   . Personal history of radiation therapy 2015   RIGHT breast-mammosite per pt  . Sleep difficulties    LUNESTA HAS HELPED  . Vaginal cyst     Past Surgical History:  Procedure Laterality Date  . ABDOMINAL HYSTERECTOMY  1992   DUB; fibroids; endometriosis.  One remaining ovary.    [name] BREAST LUMPECTOMY Right 2015   Papillary DCIS, sentinel node negative. DR/PR positive. PARTIAL RIGHT MASTECTOMY FOR BREAST CANCER--HAD RADIATION - NO CHEMO --DR. Perkins ONCOLOGIST  . BREAST SURGERY Right March 2014   Wide excision,APB RT 10 mm papillary DCIS, ER/PR positive. Sentinel node negative. Partial breast radiation.  [name] CATARACT EXTRACTION, BILATERAL  02/13/2016   [place].  . CHOLECYSTECTOMY  1994  . COLONOSCOPY  2015   1 benign polyp-every 5 years/ Dr Candace Cruise  . ERCP  1995  . JOINT REPLACEMENT Right Sept 2014   knee  . PERICARDIOCENTESIS N/A 11/13/2017   Procedure: PERICARDIOCENTESIS;  Surgeon: Nelva Bush, MD;  Location: [facility];  Service: Cardiovascular;  Laterality: N/A;  . TOTAL KNEE ARTHROPLASTY Right 04/16/2013   Procedure: RIGHT TOTAL KNEE ARTHROPLASTY;  Surgeon: Mauri Pole, MD;  Location: [facility];  Service: Orthopedics;  Laterality: Right;  . TOTAL KNEE ARTHROPLASTY Left 09/15/2014   Procedure: LEFT TOTAL KNEE ARTHROPLASTY;  Surgeon: Mauri Pole, MD;  Location: [facility];  Service: Orthopedics;  Laterality: Left;  . TUBAL LIGATION  1979    Social History   Socioeconomic History  . Marital status: Widowed    Spouse name: Chrissie Noa  . Number of children: 2  . Years of education: College  . Highest education level: Bachelor's degree (e.g., BA, AB, BS)  Occupational History  . Occupation: Retired  Scientific laboratory technician    . Financial resource strain: Not hard at all  . Food insecurity:    Worry: Never true    Inability: Never true  . Transportation needs:    Medical: No    Non-medical: No  Tobacco Use  . Smoking status: Never Smoker  . Smokeless tobacco: Never Used  . Tobacco comment: social smoker as a teen  Substance and Sexual Activity  . Alcohol use: No  . Drug use: No  . Sexual activity: Not Currently    Birth control/protection: Post-menopausal, Surgical  Lifestyle  . Physical activity:    Days per week: 7 days    Minutes per session: 20 min  . Stress: Only a little  Relationships  . Social connections:    Talks on phone: More than three times a week    Gets together: More than three times a week    Attends religious service: More than 4 times per year    Active member of club or organization: Yes  Attends meetings of clubs or organizations: More than 4 times per year    Relationship status: Widowed  . Intimate partner violence:    Fear of current or ex partner: No    Emotionally abused: No    Physically abused: No    Forced sexual activity: No  Other Topics Concern  . Not on file  Social History Narrative   Marital status: widowed since 09/16/2015      Children: 2 children (11, 60); 5 grandchild (4 in [place]; 1 in [place]).      Lives: alone in townhome; 1 dog, 2 cats      Employment: psychiatric Education officer, museum; retired in 2015      Tobacco: teenager only      Alcohol:  None      Drugs: none      Exercise: walking in 2019; more active in 2019.  Walking daily small amounts.        ADLs: drives; independent with ADLs; no assistant devices      Advanced Directives: none; FULL CODE; no prolonged measures.   Does not need them.  Daughter/Heather [place] Deck is HCPOA.     Family History  Problem Relation Age of Onset  . Colon cancer Mother   . Cancer Mother 34       colon cancer  . Melanoma Father   . Cancer Father 48       melanoma  . Colon cancer Maternal Grandfather   . Breast  cancer Maternal Grandfather     ROS: no fevers or chills, productive cough, hemoptysis, dysphasia, odynophagia, melena, hematochezia, dysuria, hematuria, rash, seizure activity, orthopnea, PND, pedal edema, claudication. Remaining systems are negative.  Physical Exam: Well-developed well-nourished in no acute distress.  Skin is warm and dry.  HEENT is normal.  Neck is supple.  Chest is clear to auscultation with normal expansion.  Cardiovascular exam is regular rate and rhythm.  Abdominal exam nontender or distended. No masses palpated. Extremities show no edema. neuro grossly intact  A/P  1 paroxysmal atrial fibrillation-patient is in sinus rhythm today on examination.  Continue metoprolol and Cardizem for rate control if atrial fibrillation recurs.  Continue sotalol.  As outlined in previous hospital notes I think patient developed pericarditis which caused atrial fibrillation.  She was then placed on Xarelto to prevent embolic event and developed a hemorrhagic pericardial effusion.  I am hopeful that as she is further removed from the pericarditis her atrial fibrillation will also improve and we can discontinue sotalol.  I will not resume anticoagulation at this point given recent hemorrhagic pericardial effusion.  Patient understands there is a higher risk of CVA off of anticoagulation.  2 recent hemorrhagic pericardial effusion-status post pericardiocentesis.  Most recent echocardiogram showed small effusion.  3 chronic diastolic congestive heart failure-patient is unclear about dose of diuretic.  She will contact us later and also about dose of potassium.  At present she appears to be taking 40 mg 3 times daily.  I will likely decrease this dose if this is accurate.  4 hypertension-blood pressure is controlled.  Continue present medications.  [name], MD

## 2018-01-30 ENCOUNTER — Telehealth: Payer: Self-pay | Admitting: Cardiology

## 2018-01-30 ENCOUNTER — Other Ambulatory Visit: Payer: Self-pay | Admitting: Cardiology

## 2018-01-30 NOTE — Telephone Encounter (Signed)
New Message:     Please fax pt's medication list to [phone],

## 2018-01-30 NOTE — Telephone Encounter (Signed)
MED LIST FAXED AS REQUESTED TO LISTED NUMBER .Selena Bush

## 2018-02-01 ENCOUNTER — Other Ambulatory Visit: Payer: Self-pay | Admitting: *Deleted

## 2018-02-01 ENCOUNTER — Encounter: Payer: Self-pay | Admitting: *Deleted

## 2018-02-01 NOTE — Patient Outreach (Signed)
[facility]) Care Management [place] Telephone Outreach, Transition of Care day 32  02/01/2018  YASAMIN KAREL 25-Oct-1945 [id number]  Successful telephone outreach to Jacqulynn Cadet an 72 y.o.femalereferred to [place] after 2 recent hospitalizations:  March 31- April 8, 2056fr hemorrhagic pericardial effusion with cardiac tamponade secondary to anticoagulation therapy; [facility]- May 7, 201[facility]f Acute Respiratory failure with hypoxia/ hypercarbia secondary to pneumonia, and A-Fib with RVR; patient was discharged to SNF   Receivedreferralnotification on Dec 29, 2017 that patient had discharged from SNF with home health services in place.Patient has history including, but not limited to, HTN/ HLD; CHF; A-Fib; OSA on CPAP; GERD; morbid obesity; and depression/ anxiety.HIPAA/ identity verified with patientduring phone call today.  Today, patientcontinues toreport that she "is doingreally great"since SNF discharge; reports that she has started driving short distances again and "is doing well with it."  Re-confirms that home health services have ended, and that she continues having a private duty caregiver three times a week, that is also available to transport her to provider appointments if/ when necessary.  Patient denies new/ recent fallstoday, and she sounds to be in no obvious or apparent distress throughout entirety of phone call today.  Patient further reports:  -- Has all medicationsand takes as prescribed;denies questions/ concernsaroundcurrent medications, states that her son and daughter continue to fill pill box for her and she takes medications independently once they are placed in pill box. -- Allrecent and upcoming provider appointments were reviewed with patient today; patient verbalizes accurate reporting of upcoming provider appointments and states that her private duty caregiver provided  transportation to scheduled cardiology provider office visit January 29, 2018; reports "no changes made" during cardiology visit and that she 'got a good report."  Reports to see current PCP February 14, 2018 prior to relocation, and that she will see "new" PCP in August.  Self-health management ofchronic disease state of CHF and A-Fib: -- confirms that she has been monitoring but NOT recording daily weights; patient again declines sharing her weight with me, and states that she 'can remember from day to day what" her weight is; I questioned patient as to what her weight was one week ago, and she could not remember; I encouraged patient to record weight as well as to monitor-- using this as an example of how it is difficult to remember weights when they are not recorded.  Reminded patient of weight gain guidelines in setting of CHF, to report weight gain > 3 lbs overnight/ 5 lbs in one week; discussed with patient how she will not know if she has gained > 5 lbs in one week, if she does not consistently record these values; patient stated she "would consider" beginning to record daily weights, but added that she "coulodn't make any promises." -- patient reports "in green zone" today and is able to verbalize signs/ symptoms concerning for yellow zone of CHF and AF with minimal prompting -- continuesrestricting fluid intake to "1200 cc's per day;"  -- use of recently prescribed home O2 thoroughly discussed with patient, who reports that she has consistently decreased her [place] home O2, and is "now using mainly at night," and minimally during day; now that patient is driving again, I questioned her as to whether she takes her portable O2 tanks in vehicle with her, and she stated she "does not;" encouraged patient to consider taking portable O2 with her when she is out, in case  she needs it, and she again stated she would "consider."  Patient denies further issues, concerns, or problems today. Iconfirmed that  patient hasmy direct phone number,the [facility] CM 24-hour nurse advice phone number, andthe main [facility] CM office phone number should issues arise prior to next scheduled [place] outreach, with phone callin 2- 3 weeks, as patient declined need for home visit.  Plan:  Patient will take medications as prescribed and will attend all provider appointments  Patient will promptly notify care providers for any new concerns/ issues/ problems that arise  Patient will continue monitoring and consider beginning to record daily weights and will report weight gain > 3 lbs overnight/ 5 lbs in one week to care providers  Patient will continue reviewing previously provided educational material on self-health management of CHF and A-fib as indicated  [facility] outreach to continue with phone callwithin 3 weeks  [facility] CM Care Plan Problem One     Most Recent Value  Care Plan Problem One  High risk for hospital re-admission, secondary to/ as evidenced by multiple recent hospitalizations and SNF rehabilitation visits  Role Documenting the Problem One  Care Management Coordinator  Care Plan for Problem One  Not Active  [facility] Long Term Goal   Over the next 31 days, patient will not experience hospital re-admission, as evidenced by patient reporting and review of EMR during [place] RN CM outreach  [facility] Long Term Goal Start Date  01/01/18  [facility] Long Term Goal Met Date  02/01/18-- Goal Met  Interventions for Problem One Long Term Goal  confirmed that patient has not experienced hospital readmission within last 31 days of hsopital discharge    [facility] CM Care Plan Problem Two     Most Recent Value  Care Plan Problem Two  Knowledge deficit related to new diagnosis' of CHF and A-fib, as evidenced by patient reporitng of same  Role Documenting the Problem Two  Care Management Coordinator  Care Plan for Problem Two  Active  Interventions for Problem Two Long Term Goal   discussed with patient  current clinical status,  confirmed that she continues monitoring daily weights, taking medications as prescribed, and attending scheduled provider appointments,  reiterated signs/ symptoms CHF yellow zone with patient along with corresponding action plan,  confirmed that patient has not experienced concerning signs/ symptoms recently, and that she is able to verbalize signs/ symptoms/ action plan for yellow zone with minimal prompting,  discussed use of home O2 with patient  [facility] Long Term Goal  Over the next 51 days, patient will be able to verbalize signs/ symptoms of AF and CHF yellow zone, along with corresponding action plans, as evidenced by patient reporting during [place] RN CM outreach  [facility] Long Term Goal Start Date  01/10/18  [facility] CM Short Term Goal #1   Over the next 21 days, patient will monitor and record daily weights, as evidenced by patient reporting and review of same during [place] RN CM outreach [Goal modified today R/T report that she is not recording wts]  [facility] CM Short Term Goal #1 Start Date  02/01/18 Barrie Folk re-established]  Interventions for Short Term Goal #2   Discussed with patient her report that she has been monitoring but not recording daily weights,  encouraged patient to begin recording daily daily weights, as this is the most reliable way to review daily weight trends over time,  positive reinforcement provided for patient monitoring weight,  ensured that patient is able to  verbalize weight gai guidelines to report weight gain > 3 lbs overnight/ 5 lbs in one week to care providers     Oneta Rack, RN, BSN, [place] [facility] Management  [phone]

## 2018-02-05 ENCOUNTER — Telehealth: Payer: Self-pay | Admitting: Family Medicine

## 2018-02-05 NOTE — Telephone Encounter (Signed)
Copied from [employer] [id number]. Topic: Inquiry >> Feb 05, 2018  2:16 PM Conception Chancy, NT wrote: Reason for CRM: Larene Beach is calling from [facility] and states that she sent back the fax that was scanned in the patient chart on 12/19/17. It is under CMN. She states it is missing the length of need and diagnoses code. She would like this filled out and sent back as soon as possible as she states she has been waiting a while for it. Please advice.   Fax# [phone] Cb# [phone] ext [phone]

## 2018-02-06 NOTE — Telephone Encounter (Signed)
Form completed and faxed to [facility] at [facility].

## 2018-02-08 ENCOUNTER — Other Ambulatory Visit: Payer: Self-pay

## 2018-02-08 ENCOUNTER — Telehealth: Payer: Self-pay | Admitting: Cardiology

## 2018-02-08 MED ORDER — SOTALOL HCL 120 MG PO TABS: 120.0000 mg | ORAL_TABLET | Freq: Two times a day (BID) | ORAL | 11 refills | Status: DC

## 2018-02-08 NOTE — Telephone Encounter (Signed)
New Message:        *STAT* If patient is at the pharmacy, call can be transferred to refill team.   1. Which medications need to be refilled? (please list name of each medication and dose if known) sotalol (BETAPACE) 120 MG tablet(Expired)  2. Which pharmacy/location (including street and city if local pharmacy) is medication to be sent to?[id number] - [place], [place] - [address]  3. Do they need a 30 day or 90 day supply? [place]

## 2018-02-09 MED ORDER — SOTALOL HCL 120 MG PO TABS: 120.0000 mg | ORAL_TABLET | Freq: Two times a day (BID) | ORAL | 11 refills | Status: DC

## 2018-02-09 NOTE — Telephone Encounter (Signed)
Rx(s) sent to pharmacy electronically.  

## 2018-02-09 NOTE — Progress Notes (Signed)
Subjective:    Patient ID: Selena Bush, female    DOB: 28-Nov-1945, 72 y.o.   MRN: [id number]  02/14/2018  Medication Refill (medication check )    HPI This 72 y.o. female presents for 6-week follow-up evaluation of anxiety.  Management changes made at last visit include adding gabapentin 100 mg 3 times daily for anxiety. Anxiety is much better.  75% improved.  Denies side effects to gabapentin therapy.  Does not want to adjust medication at all. Two major events that were not good:  1.  Dog for 13 years; thought had gotten everything up but dog got a Cymbalta and ate it; found half of gel coating; died from Cymbalta; vomited on Saturday night.  Still upset; just happened.  Hospitalzed for seven weeks; dog had a hard time with pt in hospital.  2. Two cats; has ne cat/Marie; cat died of saddle thrombosis. Prescrbied Plavix.   Hyponatremia:  Sodium 134.    Pericardial effusion: Stable at this time.  Denies chest pain, dyspnea on exertion, orthopnea.  Followed closely by cardiology.  Atrial fibriillation: very concerned about not being on anticoagulation.  Atrial fibrillation might go away.  Really happy with [name], MD.  Getting around just fine. Driving now. Grocery shopping some; daughter helping.   Rides a basket.  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 Pulse oximetry upper 90s.   Does not need oxygen supplemenation any longer.    BP Readings from Last 3 Encounters:  02/14/18 110/60  01/29/18 108/70  01/10/18 116/64   Wt Readings from Last 3 Encounters:  02/14/18 203 lb 12.8 oz (92.4 kg)  01/29/18 201 lb (91.2 kg)  01/10/18 199 lb (90.3 kg)   Immunization History  Administered Date(s) Administered  . Influenza, High Dose Seasonal PF 08/10/2014, 05/05/2015  . Influenza,inj,Quad PF,6+ Mos 06/07/2016  . Pneumococcal Conjugate-13 02/06/2016  . Pneumococcal Polysaccharide-23 09/22/2014    Review of Systems  Constitutional: Negative for activity  change, appetite change, chills, diaphoresis, fatigue, fever and unexpected weight change.  HENT: Negative for congestion, dental problem, drooling, ear discharge, ear pain, facial swelling, hearing loss, mouth sores, nosebleeds, postnasal drip, rhinorrhea, sinus pressure, sneezing, sore throat, tinnitus, trouble swallowing and voice change.   Eyes: Negative for photophobia, pain, discharge, redness, itching and visual disturbance.  Respiratory: Negative for apnea, cough, choking, chest tightness, shortness of breath, wheezing and stridor.   Cardiovascular: Negative for chest pain, palpitations and leg swelling.  Gastrointestinal: Negative for abdominal distention, abdominal pain, anal bleeding, blood in stool, constipation, diarrhea, nausea, rectal pain and vomiting.  Endocrine: Negative for cold intolerance, heat intolerance, polydipsia, polyphagia and polyuria.  Genitourinary: Negative for decreased urine volume, difficulty urinating, dyspareunia, dysuria, enuresis, flank pain, frequency, genital sores, hematuria, menstrual problem, pelvic pain, urgency, vaginal bleeding, vaginal discharge and vaginal pain.  Musculoskeletal: Positive for back pain. Negative for arthralgias, gait problem, joint swelling, myalgias, neck pain and neck stiffness.  Skin: Negative for color change, pallor, rash and wound.  Allergic/Immunologic: Negative for environmental allergies, food allergies and immunocompromised state.  Neurological: Negative for dizziness, tremors, seizures, syncope, facial asymmetry, speech difficulty, weakness, light-headedness, numbness and headaches.  Hematological: Negative for adenopathy. Does not bruise/bleed easily.  Psychiatric/Behavioral: Positive for dysphoric mood. Negative for agitation, behavioral problems, confusion, decreased concentration, hallucinations, self-injury, sleep disturbance and suicidal ideas. The patient is nervous/anxious. The patient is not hyperactive.     Past  Medical History:  Diagnosis Date  . Anxiety   . Atrial fibrillation ([place])   .  Chronic diastolic CHF (congestive heart failure) ([place])    a. echo 09/2014: EF 58-83%, diastolic dysfunction, mild LVH, nl RV size & systolic function, mildly dilated LA (4.3 cm), mild MR/TR, mildly elevated PASP 36.7 mm Hg  . DDD (degenerative disc disease), cervical   . DDD (degenerative disc disease), lumbar   . Depression   . Diffuse cystic mastopathy 2014  . Dyspnea   . GERD (gastroesophageal reflux disease)   . Headache    rare  . HLD (hyperlipidemia)    a. statin intolerant 2/2 myalgias  . Hypercholesterolemia   . Hypertension   . Malignant neoplasm of upper-outer quadrant of female breast ([place]) 10/2012   Papillary DCIS, sentinel node negative. DR/PR positive. PARTIAL RIGHT MASTECTOMY FOR BREAST CANCER--HAD RADIATION - NO CHEMO --DR. Cove ONCOLOGIST  . Obesity   . OSA on CPAP   . Osteoarthritis of both knees    a. s/p right TKA 04/2013 & left TKA 09/2014  . Otitis externa   . Personal history of radiation therapy 2015   RIGHT breast-mammosite per pt  . Sleep difficulties    LUNESTA HAS HELPED  . Vaginal cyst    Past Surgical History:  Procedure Laterality Date  . ABDOMINAL HYSTERECTOMY  1992   DUB; fibroids; endometriosis.  One remaining ovary.    [name] BREAST LUMPECTOMY Right 2015   Papillary DCIS, sentinel node negative. DR/PR positive. PARTIAL RIGHT MASTECTOMY FOR BREAST CANCER--HAD RADIATION - NO CHEMO --DR. [place]den ONCOLOGIST  . BREAST SURGERY Right March 2014   Wide excision,APB RT 10 mm papillary DCIS, ER/PR positive. Sentinel node negative. Partial breast radiation.  [name] CATARACT EXTRACTION, BILATERAL  02/13/2016   [place].  . CHOLECYSTECTOMY  1994  . COLONOSCOPY  2015   1 benign polyp-every 5 years/ Dr Candace Cruise  . ERCP  1995  . JOINT REPLACEMENT Right Sept 2014   knee  . PERICARDIOCENTESIS N/A 11/13/2017   Procedure: PERICARDIOCENTESIS;  Surgeon: Nelva Bush, MD;   Location: [facility];  Service: Cardiovascular;  Laterality: N/A;  . TOTAL KNEE ARTHROPLASTY Right 04/16/2013   Procedure: RIGHT TOTAL KNEE ARTHROPLASTY;  Surgeon: Mauri Pole, MD;  Location: [facility];  Service: Orthopedics;  Laterality: Right;  . TOTAL KNEE ARTHROPLASTY Left 09/15/2014   Procedure: LEFT TOTAL KNEE ARTHROPLASTY;  Surgeon: Mauri Pole, MD;  Location: [facility];  Service: Orthopedics;  Laterality: Left;  . TUBAL LIGATION  1979   Allergies  Allergen Reactions  . Penicillins Anaphylaxis    anaphylaxis  Has patient had a PCN reaction causing immediate rash, facial/tongue/throat swelling, SOB or lightheadedness with hypotension: Yes Has patient had a PCN reaction causing severe rash involving mucus membranes or skin necrosis: No Has patient had a PCN reaction that required hospitalization: No Has patient had a PCN reaction occurring within the last 10 years: No If all of the above answers are "NO", then may proceed with Cephalosporin use.   . Sulfa Antibiotics Anaphylaxis  . Codeine Other (See Comments) and Nausea And Vomiting    Gi problems   . Statins Other (See Comments)    Leg pains  . Aspirin Other (See Comments)    "burned my stomach intensely" Abdominal pain and burning   Current Outpatient Medications on File Prior to Visit  Medication Sig Dispense Refill  . colchicine 0.6 MG tablet Take 1 tablet (0.6 mg total) by mouth 2 (two) times daily. (Patient taking differently: Take 0.6 mg by mouth 2 (two) times daily as needed (for gout). )  30 tablet 0  . diltiazem (CARDIZEM CD) 180 MG 24 hr capsule Take 1 capsule (180 mg total) by mouth daily. Please keep upcoming appointment for continuation of medication refills 30 capsule 0  . DULoxetine (CYMBALTA) 60 MG capsule Take 60 mg by mouth 2 (two) times daily.     [name] esomeprazole (NEXIUM) 40 MG capsule Take 1 capsule (40 mg total) by mouth every morning. (Patient taking differently: Take 80 mg by mouth every morning. ) 90  capsule 3  . fluticasone (FLONASE) 50 MCG/ACT nasal spray Place 2 sprays into both nostrils daily as needed for rhinitis or allergies. 17 g 11  . furosemide (LASIX) 40 MG tablet TAKE 1 TABLET BY MOUTH EVERY DAY 30 tablet 11  . guaiFENesin-dextromethorphan (ROBITUSSIN DM) 100-10 MG/5ML syrup Take 5 mLs by mouth every 4 (four) hours as needed for cough. 118 mL 0  . Melatonin 3 MG TABS Take 5 mg by mouth.    . metoprolol tartrate (LOPRESSOR) 25 MG tablet TAKE 3 TABLETS (75 MG) BY MOUTH 2 (TWO) TIMES DAILY. 90 tablet 0  . potassium chloride SA (KLOR-CON M20) 20 MEQ tablet Take 2 tablets (40 mEq total) by mouth 2 (two) times daily. (Patient taking differently: Take 20 mEq by mouth daily. ) 120 tablet 2  . sotalol (BETAPACE) 120 MG tablet Take 1 tablet (120 mg total) by mouth every 12 (twelve) hours. 60 tablet 11  . tiZANidine (ZANAFLEX) 4 MG tablet TAKE 1 TABLET BY MOUTH AT SUPPER AND TAKE 1 TABLET BY MOUTH AT BEDTIME 180 tablet 5   No current facility-administered medications on file prior to visit.    Social History   Socioeconomic History  . Marital status: Widowed    Spouse name: Chrissie Noa  . Number of children: 2  . Years of education: College  . Highest education level: Bachelor's degree (e.g., BA, AB, BS)  Occupational History  . Occupation: Retired  Scientific laboratory technician  . Financial resource strain: Not hard at all  . Food insecurity:    Worry: Never true    Inability: Never true  . Transportation needs:    Medical: No    Non-medical: No  Tobacco Use  . Smoking status: Never Smoker  . Smokeless tobacco: Never Used  . Tobacco comment: social smoker as a teen  Substance and Sexual Activity  . Alcohol use: No  . Drug use: No  . Sexual activity: Not Currently    Birth control/protection: Post-menopausal, Surgical  Lifestyle  . Physical activity:    Days per week: 7 days    Minutes per session: 20 min  . Stress: Only a little  Relationships  . Social connections:    Talks on phone:  More than three times a week    Gets together: More than three times a week    Attends religious service: More than 4 times per year    Active member of club or organization: Yes    Attends meetings of clubs or organizations: More than 4 times per year    Relationship status: Widowed  . Intimate partner violence:    Fear of current or ex partner: No    Emotionally abused: No    Physically abused: No    Forced sexual activity: No  Other Topics Concern  . Not on file  Social History Narrative   Marital status: widowed since 09/16/2015      Children: 2 children (69, 56); 5 grandchild (4 in [place]; 1 in [place]).  Lives: alone in townhome; 1 dog, 2 cats      Employment: psychiatric Education officer, museum; retired in 2015      Tobacco: teenager only      Alcohol:  None      Drugs: none      Exercise: walking in 2019; more active in 2019.  Walking daily small amounts.        ADLs: drives; independent with ADLs; no assistant devices      Advanced Directives: none; FULL CODE; no prolonged measures.   Does not need them.  Daughter/Heather Vista Deck is HCPOA.    Family History  Problem Relation Age of Onset  . Colon cancer Mother   . Cancer Mother 45       colon cancer  . Melanoma Father   . Cancer Father 78       melanoma  . Colon cancer Maternal Grandfather   . Breast cancer Maternal Grandfather        Objective:    BP 110/60   Pulse 84   Temp 98.7 F (37.1 C) (Oral)   Resp 18   Ht 5\' 2"  (1.575 m)   Wt 203 lb 12.8 oz (92.4 kg)   SpO2 95%   BMI 37.28 kg/m  Physical Exam  Constitutional: She is oriented to person, place, and time. She appears well-developed and well-nourished. No distress.  HENT:  Head: Normocephalic and atraumatic.  Right Ear: External ear normal.  Left Ear: External ear normal.  Nose: Nose normal.  Mouth/Throat: Oropharynx is clear and moist.  Eyes: Pupils are equal, round, and reactive to light. Conjunctivae and EOM are normal.  Neck: Normal range of motion.  Neck supple. Carotid bruit is not present. No thyromegaly present.  Cardiovascular: Normal rate, regular rhythm, normal heart sounds and intact distal pulses. Exam reveals no gallop and no friction rub.  No murmur heard. Pulmonary/Chest: Effort normal and breath sounds normal. She has no wheezes. She has no rales.  Abdominal: Soft. Bowel sounds are normal. She exhibits no distension and no mass. There is no tenderness. There is no rebound and no guarding. No hernia.  Lymphadenopathy:    She has no cervical adenopathy.  Neurological: She is alert and oriented to person, place, and time. She displays normal reflexes. No cranial nerve deficit or sensory deficit. She exhibits normal muscle tone. Coordination normal.  Skin: Skin is warm and dry. No rash noted. She is not diaphoretic. No erythema. No pallor.  Psychiatric: She has a normal mood and affect. Her behavior is normal. Judgment and thought content normal.   No results found. Depression screen [facility] 2/9 02/14/2018 01/10/2018 01/05/2018 12/29/2017 10/11/2017  Decreased Interest 0 0 0 0 0  Down, Depressed, Hopeless 0 0 0 3 0  PHQ - 2 Score 0 0 0 3 0  Altered sleeping - - - 3 -  Tired, decreased energy - - - 3 -  Change in appetite - - - 3 -  Feeling bad or failure about yourself  - - - 2 -  Trouble concentrating - - - 0 -  Moving slowly or fidgety/restless - - - 0 -  Suicidal thoughts - - - 0 -  PHQ-9 Score - - - 14 -  Difficult doing work/chores - - - Not difficult at all -   Fall Risk  02/14/2018 02/01/2018 01/18/2018 01/10/2018 01/05/2018  Falls in the past year? No (No Data) (No Data) No No  Comment - Denies new/ recent falls No new/ recent falls per  patient report today - -  Number falls in past yr: - - - - -  Injury with Fall? - - - - -  Risk for fall due to : - - - Impaired balance/gait -  Risk for fall due to: Comment - - - "At time"-- fall risks and prevention education provided -  Follow up - - - - -        Assessment & Plan:   1.  Anxiety   2. Essential hypertension, benign   3. Chronic diastolic CHF (congestive heart failure) ([place])   4. Atrial fibrillation with RVR ([place])   5. Pericardial effusion with cardiac tamponade   6. Major depressive disorder with single episode, in partial remission ([place])   7. Hyponatremia   8. Physical deconditioning     Anxiety with major depressive disorder: Much improved with gabapentin 100 mg 3 times daily for anxiety.  Depression significantly improved since hospital discharge.  Patient now has different perspective on life and very appreciative for ongoing life.  Pericardial effusion with recent cardiac Tamponade: Much improved.  Followed closely by cardiology.  Asymptomatic at this time.  No anticoagulation at this time which is concerning to cardiology at risk of restarting may outweigh the benefits.  Atrial fibrillation: Controlled.  Asymptomatic.  Currently not on anticoagulation due to recent pericardial effusion with Tamponade.  Hyponatremia: Improved at last visit.  Asymptomatic.  Deconditioning: Improved.  Patient is gaining independence back.  She is completed physical therapy, occupational therapy at home.  Driving again and able to ambulate within the home without assistance.  Patient plans to establish with [facility] which is within 5 miles of home.  Considering recent prolonged hospitalization, I am very supportive with this decision.  She will transition her care next month.  No orders of the defined types were placed in this encounter.  Meds ordered this encounter  Medications  . gabapentin (NEURONTIN) 100 MG capsule    Sig: Take 1 capsule (100 mg total) by mouth 3 (three) times daily.    Dispense:  90 capsule    Refill:  2    No follow-ups on file.   Dalanie Kisner Elayne Guerin, M.D. Primary Care at [facility] previously [facility] [address] [place], [place]  [address] [phone] phone [phone] fax

## 2018-02-14 ENCOUNTER — Encounter: Payer: Self-pay | Admitting: Family Medicine

## 2018-02-14 ENCOUNTER — Ambulatory Visit (INDEPENDENT_AMBULATORY_CARE_PROVIDER_SITE_OTHER): Payer: PPO | Admitting: Family Medicine

## 2018-02-14 ENCOUNTER — Other Ambulatory Visit: Payer: Self-pay

## 2018-02-14 VITALS — BP 110/60 | HR 84 | Temp 98.7°F | Resp 18 | Ht 62.0 in | Wt 203.8 lb

## 2018-02-14 DIAGNOSIS — R5381 Other malaise: Secondary | ICD-10-CM | POA: Diagnosis not present

## 2018-02-14 DIAGNOSIS — E871 Hypo-osmolality and hyponatremia: Secondary | ICD-10-CM | POA: Diagnosis not present

## 2018-02-14 DIAGNOSIS — F419 Anxiety disorder, unspecified: Secondary | ICD-10-CM | POA: Diagnosis not present

## 2018-02-14 DIAGNOSIS — I10 Essential (primary) hypertension: Secondary | ICD-10-CM | POA: Diagnosis not present

## 2018-02-14 DIAGNOSIS — F324 Major depressive disorder, single episode, in partial remission: Secondary | ICD-10-CM

## 2018-02-14 DIAGNOSIS — I5032 Chronic diastolic (congestive) heart failure: Secondary | ICD-10-CM | POA: Diagnosis not present

## 2018-02-14 DIAGNOSIS — I313 Pericardial effusion (noninflammatory): Secondary | ICD-10-CM | POA: Diagnosis not present

## 2018-02-14 DIAGNOSIS — I314 Cardiac tamponade: Secondary | ICD-10-CM

## 2018-02-14 DIAGNOSIS — I4891 Unspecified atrial fibrillation: Secondary | ICD-10-CM

## 2018-02-14 DIAGNOSIS — I3139 Other pericardial effusion (noninflammatory): Secondary | ICD-10-CM

## 2018-02-14 MED ORDER — GABAPENTIN 100 MG PO CAPS: 100.0000 mg | ORAL_CAPSULE | Freq: Three times a day (TID) | ORAL | 2 refills | Status: DC

## 2018-02-14 NOTE — Patient Instructions (Signed)
     IF you received an x-ray today, you will receive an invoice from [facility]. Please contact [facility] at [phone] with questions or concerns regarding your invoice.   IF you received labwork today, you will receive an invoice from [facility]. Please contact [facility] at [phone] with questions or concerns regarding your invoice.   Our billing staff will not be able to assist you with questions regarding bills from these companies.  You will be contacted with the lab results as soon as they are available. The fastest way to get your results is to activate your My Chart account. Instructions are located on the last page of this paperwork. If you have not heard from us regarding the results in 2 weeks, please contact this office.     

## 2018-02-21 ENCOUNTER — Encounter: Payer: Self-pay | Admitting: *Deleted

## 2018-02-21 ENCOUNTER — Other Ambulatory Visit: Payer: Self-pay | Admitting: *Deleted

## 2018-02-21 ENCOUNTER — Telehealth: Payer: Self-pay | Admitting: Family Medicine

## 2018-02-21 DIAGNOSIS — G4733 Obstructive sleep apnea (adult) (pediatric): Secondary | ICD-10-CM

## 2018-02-21 DIAGNOSIS — E662 Morbid (severe) obesity with alveolar hypoventilation: Secondary | ICD-10-CM

## 2018-02-21 DIAGNOSIS — I5032 Chronic diastolic (congestive) heart failure: Secondary | ICD-10-CM

## 2018-02-21 DIAGNOSIS — I4891 Unspecified atrial fibrillation: Secondary | ICD-10-CM

## 2018-02-21 NOTE — Telephone Encounter (Signed)
Copied from [place] [id number]. Topic: Quick Communication - See Telephone Encounter >> Feb 21, 2018  3:57 PM Boyd Kerbs wrote: CRM for notification. See Telephone encounter for: 02/21/18.  Pt called asking if Dr. Tamala Julian about getting a in home sleep study.  Asking if can get referral for this.

## 2018-02-21 NOTE — Patient Outreach (Signed)
[facility]) Care Management [place] Telephone Outreach  02/21/2018  Selena Bush [birth year] [id number]  10:15 am: Unsuccessful telephone outreach toCarolyn K Ashbyis an 72 y.o.femalereferred to [facility] after 2 recent hospitalizations:  March 31- April 8, 2024for hemorrhagic pericardial effusion with cardiac tamponade secondary to anticoagulation therapy; patient was discharged to [facility]  April 26- May 7, 2073for Acute Respiratory failure with hypoxia/ hypercarbia secondary to pneumonia, and A-Fib with RVR; patient was discharged to SNF   Patient has history including, but not limited to, HTN/ HLD; CHF; A-Fib; OSA on CPAP; GERD; morbid obesity; and depression/ anxiety.  HIPAA compliant voice mail message left for patient, requesting return call back.  3:35 pm:  Re-attempted call to patient later this afternoon and successfully connected with her; HIPAA/ identity verified.  Today, patient reports that she "is doing really great;" states that she believes she is 'almost back to normal," and confirms that she has been driving herself to all errands/ provider appointments, and was even able to drive to see her daughter, who lives in [place], [place].  Patient denies distress, concerns, problems today, and she sounds to be in obvious distress throughout entirety of phone call today.  Patient further reports:  -- attended recent provider appointments for cardiology and PCP office visits: states visits "went well," and that no medication changes were made.  Patient verbalizes concern regarding not being "back on blood thinners," but states that her cardiologist discussed this with her thoroughly at time of office visit 01/29/18; patient states that she will follow his instructions, and confirmed that she has scheduled follow up visit in 3 months  -- Has all medicationsand takes as prescribed;denies questions/ concernsaroundcurrent medications, states that  her son and daughtercontinue tofill pill box for herand shetakes medications independently once they are placed in pill box.  Self-health management ofchronic disease state of CHF and A-Fib: -- confirms that she has been monitoring but is still NOT recording daily weights; patient again declines sharing her weight with me, and again states that she "can remember from day to day what" her weight is; states that her weight 'is a private matter."  Encouragted patient to begin recording daily weights, even if it os only for her review.  Using teach back method, reiterated with patient of weight gain guidelines in setting of CHF, to report weight gain > 3 lbs overnight/ 5 lbs in one week; patient is albe to verbalize with minimal prompting.  States that her daily weights "have stayed in the same range, within 1-2 pounds" each day; patient is also able to verbalize appropriate action plan for weight gain. -- reports one remote episode of what she believes was "A-Fib flare-up;" stated that she "felt the same way" she did prior to last hospital admission and that her SaO2 home readings indicated that her heart rate was elevated; patient stated that she "felt awful, just out of sorts."  Patient stated that this episode occurred approximetaly "3 or 4 weeks ago," and that it spontaneously resolved at home without intervention; confirmed that she shared this with her cardiologist during recent office visit; we discussed/ re-reviewed AF zones and corresponding action plan and patient reports other than this one isolated episode, she has not had any "other problems" around her AFib. -- no longer using home O2: states "breathing fine," with "only occasional minor swelling in feet" that is not ongoing  Patient denies further issues, concerns, or problems today. Iconfirmed that patient hasmy direct phone number,the [facility]  CM 24-hour nurse advice phone number,andthe main [facility] CM office phone number should issues arise  prior to next scheduled [place] outreach, with scheduled home visit in 3 weeks.  Plan:  Patient will take medications as prescribed and will attend all provider appointments  Patient will promptly notify care providers for any new concerns/ issues/ problems that arise  Patient willcontinuemonitoring daily weights and will report weight gain > 3 lbs overnight/ 5 lbs in one week to care providers  Patient willcontinuereviewing previously providededucational material on self-health management of CHF and A-fibas indicated  [facility] CM outreach to continue with scheduled home visit in 3 weeks   [facility] CM Care Plan Problem Two     Most Recent Value  Care Plan Problem Two  Knowledge deficit related to new diagnosis' of CHF and A-fib, as evidenced by patient reporitng of same  Role Documenting the Problem Two  Care Management Coordinator  Care Plan for Problem Two  Active  Interventions for Problem Two Long Term Goal   Discussed with patient current clinical status, recent provider appointments, current medications, progress made in recuperation,  reviewed with patient all recent provider appointments and confirmed that patient plans to attend all upcoming scheduled provider visits,  scheduled next routine [facility] RN CCM home visit with patient in 3 weeks  [facility] Long Term Goal  Over the next 21 days, patient will be able to verbalize signs/ symptoms of AF and CHF yellow zone, along with corresponding action plans, as evidenced by patient reporting during [place] CM outreach [Goal extended today]  [facility] Long Term Goal Start Date  02/21/18  [facility] CM Short Term Goal #1   Over the next 21 days, patient will monitor and record daily weights, as evidenced by patient reporting and review of same during [place] RN CM outreach [Goal re-established today]  [facility] CM Short Term Goal #1 Start Date  02/21/18 [place] re-established today]  Interventions for Short Term Goal #2   Discussed  with patient her recent daily weight trends and confirmed that she has not experienced weight gain of > 2 lbs/ overnight or 5 lbs/ one week,  confirmed that patient is able to verbalize weight gain guidelines in setting of CHF along with action plan for weight gain     Oneta Rack, RN, BSN, [employer] Coordinator [facility] Care Management  [phone]

## 2018-02-23 ENCOUNTER — Ambulatory Visit: Payer: PPO | Admitting: Cardiology

## 2018-02-23 NOTE — Telephone Encounter (Signed)
Patient is requesting a sleep study referral

## 2018-02-25 DIAGNOSIS — R269 Unspecified abnormalities of gait and mobility: Secondary | ICD-10-CM | POA: Diagnosis not present

## 2018-02-25 DIAGNOSIS — J9601 Acute respiratory failure with hypoxia: Secondary | ICD-10-CM | POA: Diagnosis not present

## 2018-02-25 DIAGNOSIS — I5030 Unspecified diastolic (congestive) heart failure: Secondary | ICD-10-CM | POA: Diagnosis not present

## 2018-02-26 ENCOUNTER — Other Ambulatory Visit: Payer: Self-pay | Admitting: Cardiology

## 2018-02-26 DIAGNOSIS — F4312 Post-traumatic stress disorder, chronic: Secondary | ICD-10-CM | POA: Diagnosis not present

## 2018-02-26 DIAGNOSIS — F331 Major depressive disorder, recurrent, moderate: Secondary | ICD-10-CM | POA: Diagnosis not present

## 2018-02-26 NOTE — Telephone Encounter (Signed)
Rx has been sent to the pharmacy electronically. ° °

## 2018-03-04 ENCOUNTER — Other Ambulatory Visit: Payer: Self-pay | Admitting: Cardiology

## 2018-03-06 ENCOUNTER — Telehealth: Payer: Self-pay | Admitting: Cardiology

## 2018-03-06 DIAGNOSIS — I10 Essential (primary) hypertension: Secondary | ICD-10-CM

## 2018-03-06 NOTE — Telephone Encounter (Signed)
Returned call to patient.  She states she has been taking furosemide 20 mg daily and potassium 40 meq BID.   She states she went to pick up her new prescription and it was different (it was 40 mg daily).    Advised per last OV with Dr. Stanford Breed, there was some question about the what dose she was taking of both the lasix and potassium and patient was instructed to call back with dosages.   (no phone note where patient called back).   Advised I would route to MD to verify what dosage patient should be taking a prescriptions can be corrected at that time.  Patient denies SOB, swelling, weight gain.

## 2018-03-06 NOTE — Telephone Encounter (Signed)
New Message:       Pt c/o medication issue:  1. Name of Medication: furosemide (LASIX) 40 MG tablet  2. How are you currently taking this medication (dosage and times per day)? TAKE 1 TABLET BY MOUTH EVERY DAY  3. Are you having a reaction (difficulty breathing--STAT)? No  4. What is your medication issue? Pt states this medication was 20 mg and has been changed to 40 mg and pt states she was not aware of this change until she went to pick up a prescription.

## 2018-03-06 NOTE — Telephone Encounter (Signed)
Check bmet Selena Bush  

## 2018-03-07 NOTE — Telephone Encounter (Signed)
Spoke with pt, Aware of dr Jacalyn Lefevre recommendations. Lab orders placed.

## 2018-03-08 DIAGNOSIS — I10 Essential (primary) hypertension: Secondary | ICD-10-CM | POA: Diagnosis not present

## 2018-03-09 ENCOUNTER — Other Ambulatory Visit: Payer: Self-pay | Admitting: *Deleted

## 2018-03-09 LAB — BASIC METABOLIC PANEL
BUN/Creatinine Ratio: 11 — ABNORMAL LOW (ref 12–28)
BUN: 13 mg/dL (ref 8–27)
CO2: 29 mmol/L (ref 20–29)
CREATININE: 1.14 mg/dL — AB (ref 0.57–1.00)
Calcium: 9.9 mg/dL (ref 8.7–10.3)
Chloride: 99 mmol/L (ref 96–106)
GFR calc Af Amer: 57 mL/min/{1.73_m2} — ABNORMAL LOW (ref >59)
GFR, EST NON AFRICAN AMERICAN: 49 mL/min/{1.73_m2} — AB (ref >59)
Glucose: 100 mg/dL — ABNORMAL HIGH (ref 65–99)
Potassium: 4.7 mmol/L (ref 3.5–5.2)
SODIUM: 141 mmol/L (ref 134–144)

## 2018-03-09 MED ORDER — FUROSEMIDE 20 MG PO TABS: 20.0000 mg | ORAL_TABLET | Freq: Every day | ORAL | 3 refills | Status: DC

## 2018-03-12 DIAGNOSIS — F4312 Post-traumatic stress disorder, chronic: Secondary | ICD-10-CM | POA: Diagnosis not present

## 2018-03-12 DIAGNOSIS — F331 Major depressive disorder, recurrent, moderate: Secondary | ICD-10-CM | POA: Diagnosis not present

## 2018-03-13 ENCOUNTER — Other Ambulatory Visit: Payer: Self-pay | Admitting: *Deleted

## 2018-03-13 ENCOUNTER — Encounter: Payer: Self-pay | Admitting: *Deleted

## 2018-03-13 NOTE — Patient Outreach (Signed)
[facility]) Care Management  [facility] CM Routine Home Visit Post-SNF discharge day 84 03/13/2018  Selena Bush 15-Jan-1946 [id number]  Selena Bush is a 71 y.o. female 72 y.o.femalereferred to [place] after 2 recent hospitalizations:  March 31- April 8, 2032fr hemorrhagic pericardial effusion with cardiac tamponade secondary to anticoagulation therapy; [facility] failure with hypoxia/ hypercarbia secondary to pneumonia, and A-Fib with RVR; patient was discharged to SNF   Patient has history including, but not limited to, HTN/ HLD; CHF; A-Fib; OSA on CPAP; GERD; morbid obesity; and depression/ anxiety.  HIPAA/ identity verified with patient in person at her home today; pleasant 70 minute home visit.  Today, patient reports that she "is doing great;" and she denies pain, new/ recent falls, concerns/ issues, or problems; patient is in no obvious distress throughout entirety of home visit today.  Subjective: "I am just so glad I am finally getting back to my real life after being so sick in the spring... It feels so good to be getting out with my friends, going to church, and to be able to drive myself where I need to go."  Assessment:  CaYaileenppears to have recuperated very well after her recent hospitalizations and SNF visits for rehabilitation; she has excellent ongoing family support and has resumed driving and most of her normal daily activities.  CaMonekaemains adherent to taking her medications as prescribed and attending provider appointments.  CaAnners motivated to learn self-health management strategies for newly diagnosed atrial fibrillation and CHF.  Patient further reports:  -- Has all medicationsand takes as prescribed;denies questions/ concerns/ recent changesaroundcurrent medications, states that her son and daughtercontinue tofill pill box for herand shetakes  medications independently once they are placed in pill box.  Medication list in EMR was reviewed with patient today, who verbalizes a very good general understanding of the purpose, dosing, and scheduling of her medications; patient reports that she just prefers that her son and daughter put her medications in the pill box for her, as she likes the "extra set of eyes" on her medications and feels this is beneficial to 'prevent any mistakes" she might make if she filled her own pill box.  CaAriyahas recently/ independently requested blister packaging of her medications through her outpatient pharmacy, which is about to start.  CaRaginaas demonstrated that she will independently question any changes made to her medications of she does not understand the change or believes there has been a mistake.  -- attended recent provider appointments for cardiology and PCP office visits: again states visits "went well," and that no medication changes were made.  Has started driving again, and is driving self to provider appointments; planning to attend upcoming scheduled (new patient) office visit with new PCP, as he current PCP is re-locating; new PCP is less than 3 miles from patient's home.  Previous and upcoming provider appointments reviewed with patient today.  Self-health management ofchronic disease state of CHF and A-Fib: -- confirms that she hasbeen monitoring but is still NOT recording daily weights; patientagaindeclines sharing her weight with me,and again states that she "can remember from day to day what" her weight is; states that her weight "is a private matter."  I again encouragted patient to begin recording daily weights, even if it is only for her review, however, she is adamant that she can remember from week to week what her weights are; positive reinforcement provided  that patient has consistently incorporated daily weight monitoring into her daily routine.  Using teach back method, verified  that patient is able to verbalize weight gain guidelines in setting of CHF, to report weight gain >3 lbs overnight/ 5 lbs in one week, as well as appropriate action plan for weight gain. -- Using teach back method, verified that patient is able to verbalize signs/ symptoms yellow and red CHF zones, without appropriate action plan; patient again reports "in green zone all week." -- discussed signs/ symptoms AF, and verified that patient is able to verbalize action plan; using demonstration, showed patient how to manually monitor her radial pulse of rhythm regularity; patient denies recent episiodes/ signs/ symptoms AF -- now completely weaned off O2 at home -- discussed value of incorporating more exercise into her weekly routine; patient stated that she is not very interested in walking, and stated she walks "plently" when she goes out shopping with her friends; encouraged her to consider partnering with her friends for purposeful walking as weather begins to cool; patient stated she "might" consider; patient again endorses following a low salt/ heart healthy diet, and stated that she believes she "eats pretty healthy"  Patient denies further issues, concerns, or problems today. We discussed that patient has thus far met her previously established [place] RN CM goals, and patient stated that she would like for me to stay involved in her care until she completes upcoming new PCP provider appointment; we discussed possible [facility] CCM RN case closure at that time, with transfer of care to [place], which patient is agreeable to today.   Iconfirmed that patient hasmy direct phone number,the [facility] CM 24-hour nurse advice phone number,andthe main [place] CM office phone number should issues arise prior to next scheduled [place] outreach, with scheduled phone call in 2 weeks.  Objective:   BP 116/60   Pulse 65   Resp 18   SpO2 96%    Review of Systems  Constitutional: Negative.   Negative for malaise/fatigue.  Respiratory: Negative.  Negative for cough, shortness of breath and wheezing.        No longer using home O2-- reports completely weaned off  Cardiovascular: Negative.  Negative for chest pain, palpitations and leg swelling.  Gastrointestinal: Negative.  Negative for abdominal pain, constipation, diarrhea and nausea.  Genitourinary: Positive for frequency and urgency.       Minimal-- diuretic therapy  Musculoskeletal: Positive for myalgias. Negative for falls.       Occasional myalgias; none reported today  Neurological: Negative.  Negative for dizziness.  Psychiatric/Behavioral: Negative.  Negative for depression. The patient is not nervous/anxious.    Physical Exam  Constitutional: She is oriented to person, place, and time. She appears well-developed and well-nourished. No distress.  Cardiovascular: Normal rate, regular rhythm, normal heart sounds and intact distal pulses.  Pulses:      Radial pulses are 2+ on the right side, and 2+ on the left side.       Dorsalis pedis pulses are 2+ on the right side, and 2+ on the left side.  Respiratory: Breath sounds normal. No respiratory distress. She has no wheezes. She has no rales.  GI: Soft. Bowel sounds are normal.  Musculoskeletal: She exhibits no edema.  Neurological: She is alert and oriented to person, place, and time.  Skin: Skin is warm and dry. No erythema.  Psychiatric: She has a normal mood and affect. Her behavior is normal. Judgment and thought content normal.  Encounter Medications:   Outpatient Encounter Medications as of 03/13/2018  Medication Sig Note  . colchicine 0.6 MG tablet Take 1 tablet (0.6 mg total) by mouth 2 (two) times daily. (Patient taking differently: Take 0.6 mg by mouth 2 (two) times daily as needed (for gout). ) 03/13/2018: Have not needed lately   . diltiazem (CARDIZEM CD) 180 MG 24 hr capsule Take 1 capsule (180 mg total) by mouth daily.   . DULoxetine (CYMBALTA) 60 MG  capsule Take 60 mg by mouth 2 (two) times daily.  11/12/2017: Pt does not remember if she took medication today. Last dose noted as yesterday.   . fluticasone (FLONASE) 50 MCG/ACT nasal spray Place 2 sprays into both nostrils daily as needed for rhinitis or allergies. 03/13/2018: Has not needed recently   . furosemide (LASIX) 20 MG tablet Take 1 tablet (20 mg total) by mouth daily.   [name] gabapentin (NEURONTIN) 100 MG capsule Take 1 capsule (100 mg total) by mouth 3 (three) times daily.   . Melatonin 3 MG TABS Take 5 mg by mouth.   . metoprolol tartrate (LOPRESSOR) 25 MG tablet TAKE 3 TABLETS (75 MG) BY MOUTH 2 (TWO) TIMES DAILY. PLEASE SCHEDULE APPT FOR REFILLS   . potassium chloride SA (KLOR-CON M20) 20 MEQ tablet Take 2 tablets (40 mEq total) by mouth 2 (two) times daily. (Patient taking differently: Take 20 mEq by mouth daily. )   . tiZANidine (ZANAFLEX) 4 MG tablet TAKE 1 TABLET BY MOUTH AT SUPPER AND TAKE 1 TABLET BY MOUTH AT BEDTIME   . esomeprazole (NEXIUM) 40 MG capsule Take 1 capsule (40 mg total) by mouth every morning. (Patient not taking: Reported on 03/13/2018) 03/13/2018: Patient reports that she has stopped taking- now using Activia probiotic yogurt every night, which works for acid reflux and she no longer needs medication  . sotalol (BETAPACE) 120 MG tablet Take 1 tablet (120 mg total) by mouth every 12 (twelve) hours. 03/13/2018: Patient reports 03/13/18 hat she is taking and will need refill soon   No facility-administered encounter medications on file as of 03/13/2018.    Fall/Depression Screening:    Fall Risk  03/13/2018 02/14/2018 02/01/2018  Falls in the past year? (No Data) No (No Data)  Comment No new recent falls per patient report today - Denies new/ recent falls  Number falls in past yr: - - -  Injury with Fall? - - -  Risk for fall due to : (No Data) - -  Risk for fall due to: Comment None per patient report today/ fall risk/ prevention education reinforced - -  Follow up - - -    Plan:  Patient will take medications as prescribed and will attend all provider appointments  Patient will promptly notify care providers for any new concerns/ issues/ problems that arise  Patient willcontinuemonitoring daily weights and will report weight gain > 3 lbs overnight/ 5 lbs in one week to care providers  Patient willcontinuereviewing previously providededucational material on self-health management of CHF and A-fibas indicated  I will share today's [facility] CCM RN home visit notes and care plan with patient's PCP  [facility] CM outreach to continue with scheduled phone call in 2 weeks, possibly for [place] RN CM case closure and [facility] RN health coach referral   [facility] CM Care Plan Problem Two     Most Recent Value  Care Plan Problem Two  Knowledge deficit related to new diagnosis' of CHF and A-fib, as evidenced by patient reporitng of  same  Role Documenting the Problem Two  Care Management Coordinator  Care Plan for Problem Two  Active  Interventions for Problem Two Long Term Goal   Using teach back method, verified that patient is able to verbalize signs/ symptoms yellow and red CHF and AF zones, along with corresponding action plans,  taught patient by demonstration how to manually monitor radial pulse for regularity  [facility] Long Term Goal  Over the next 21 days, patient will be able to verbalize signs/ symptoms of AF and CHF yellow zone, along with corresponding action plans, as evidenced by patient reporting during [place] RN CM outreach  [facility] Long Term Goal Start Date  02/21/18  [facility] Long Term Goal Met Date  03/13/18 [Goal Met]  [facility] CM Short Term Goal #1   Over the next 21 days, patient will monitor and record daily weights, as evidenced by patient reporting and review of same during [place] CM outreach  [facility] CM Short Term Goal #1 Start Date  02/21/18  [facility] CM Short Term Goal #1 Met Date   03/13/18 [Goal met]  Interventions for Short Term Goal #2    Confirmed that patient has incorporated daily weights in her daily routine,  again discussed value of recording daily weights with patient,  verified using teach back method that patient is able to verbalize without prompting weight gain guidelines in setting of CHF  [facility] CM Short Term Goal #2   Over the next 14 days, patient will attend new PCP provider office visit, and will contact care providers for any new concerns, issues or problems that arise, as evidenced by patient reporting, review of EMR, and collaboration with providers as indicated, during [facility] RN CCM outreach  [facility] CM Short Term Goal #2 Start Date  03/13/18  Interventions for Short Term Goal #2  Discussed with patient recent and upcoming provider appointments,  confirmed that patient will transport herself to upcoming new PCP provider office appointment and discussed importance of promptly notifying care providers for any new problems, issues, or concerns that arise     Oneta Rack, RN, BSN, [place] Coordinator [facility] Care Management  [phone]

## 2018-03-16 ENCOUNTER — Ambulatory Visit: Payer: PPO | Admitting: Primary Care

## 2018-03-16 DIAGNOSIS — L219 Seborrheic dermatitis, unspecified: Secondary | ICD-10-CM | POA: Diagnosis not present

## 2018-03-20 ENCOUNTER — Telehealth: Payer: Self-pay | Admitting: Family Medicine

## 2018-03-20 NOTE — Telephone Encounter (Signed)
Pt has appointment tomorrow with another PCP she can have them give her a new Rx for the medication

## 2018-03-20 NOTE — Telephone Encounter (Unsigned)
Copied from [place]. Topic: Quick Communication - Rx Refill/Question >> Mar 20, 2018 11:41 AM Judyann Munson wrote: Medication: DULoxetine (CYMBALTA) 60 MG capsule,Melatonin 3 MG TABS  Has the patient contacted their pharmacy? yes  Preferred Pharmacy (with phone number or street name): [facility] [address] [place] [address] SP:[phone]  Agent: Please be advised that RX refills may take up to 3 business days. We ask that you follow-up with your pharmacy.

## 2018-03-21 ENCOUNTER — Ambulatory Visit (INDEPENDENT_AMBULATORY_CARE_PROVIDER_SITE_OTHER): Payer: PPO | Admitting: Primary Care

## 2018-03-21 ENCOUNTER — Encounter: Payer: Self-pay | Admitting: Primary Care

## 2018-03-21 DIAGNOSIS — I480 Paroxysmal atrial fibrillation: Secondary | ICD-10-CM

## 2018-03-21 DIAGNOSIS — F5104 Psychophysiologic insomnia: Secondary | ICD-10-CM

## 2018-03-21 DIAGNOSIS — F419 Anxiety disorder, unspecified: Secondary | ICD-10-CM | POA: Diagnosis not present

## 2018-03-21 DIAGNOSIS — D0511 Intraductal carcinoma in situ of right breast: Secondary | ICD-10-CM

## 2018-03-21 DIAGNOSIS — I313 Pericardial effusion (noninflammatory): Secondary | ICD-10-CM | POA: Diagnosis not present

## 2018-03-21 DIAGNOSIS — F324 Major depressive disorder, single episode, in partial remission: Secondary | ICD-10-CM

## 2018-03-21 DIAGNOSIS — K219 Gastro-esophageal reflux disease without esophagitis: Secondary | ICD-10-CM | POA: Diagnosis not present

## 2018-03-21 DIAGNOSIS — I314 Cardiac tamponade: Secondary | ICD-10-CM

## 2018-03-21 DIAGNOSIS — E7800 Pure hypercholesterolemia, unspecified: Secondary | ICD-10-CM

## 2018-03-21 DIAGNOSIS — I5032 Chronic diastolic (congestive) heart failure: Secondary | ICD-10-CM | POA: Diagnosis not present

## 2018-03-21 DIAGNOSIS — I3139 Other pericardial effusion (noninflammatory): Secondary | ICD-10-CM

## 2018-03-21 NOTE — Assessment & Plan Note (Signed)
Following with cardiology.  Continue beta blocker and furosemide.

## 2018-03-21 NOTE — Assessment & Plan Note (Signed)
No current anticoagulation, may resume in September during cardiology visit. Rate and rhythm regular today.

## 2018-03-21 NOTE — Assessment & Plan Note (Signed)
Mammogram in February 2019 without evidence of malignancy. Continue routine mammograms.

## 2018-03-21 NOTE — Patient Instructions (Signed)
Follow up with Dr. Stanford Breed as scheduled.  Please schedule a physical with me and a Medicare Wellness Visit with our nurse in February 2020.   It was a pleasure to meet you today! Please don't hesitate to call or message me with any questions. Welcome to [facility]!

## 2018-03-21 NOTE — Assessment & Plan Note (Signed)
Requiring pericardiocentesis in April 2019.  Notes reviewed.

## 2018-03-21 NOTE — Progress Notes (Signed)
Subjective:    Patient ID: Selena Bush, female    DOB: 1945/08/18, 72 y.o.   MRN: [id number]  HPI  Selena Bush is a 72 year old female who presents today to establish care and discuss the problems mentioned below. Will obtain old records.  1) CHF/Atrial Fibrillation/Essential Hypertension: Currently managed on diltiazem 180 mg 24 hour capsule, furosemide 40 mg, metoprolol tartrate 25 mg BID, sotalol 120 mg, and potassium chloride 20 mEq. She is following with Selena Bush, cardiology, due for re-evaluation in September.   She was once managed on Xarelto but was removed as it was thought to have caused a pericardial effusion with tamponade which required pericardiocentesis.  Hospitalized in April 2019. She may resume anticoagulation later this year, to be determined at next cardiology visit.   2) Osteoarthritis: Muscle spasms to her lower back, managed on Tizanidine 4 mg twice daily with improvement.   3) Major Depressive Disorder/Anxiety: Currently managed on Cymbalta 60 mg for depression and Gabapentin 100 mg TID for anxiety. She is following with therapy which she thinks is going well. She does take Melatonin for assistance with sleep.   4) Gout: Currently prescribed colchicine 0.6 mg PRN. History of one episode several months ago. No return of symptoms.   Review of Systems  Respiratory: Negative for shortness of breath.   Cardiovascular: Negative for chest pain.  Musculoskeletal: Positive for arthralgias, back pain and myalgias.       Chronic.  Neurological: Negative for dizziness and headaches.  Psychiatric/Behavioral:       Feels well managed on Cymbalta and Gabapentin       Past Medical History:  Diagnosis Date  . Anxiety   . Atrial fibrillation ([place])   . Chronic diastolic CHF (congestive heart failure) ([place])    a. echo 09/2014: EF 63-78%, diastolic dysfunction, mild LVH, nl RV size & systolic function, mildly dilated LA (4.3 cm), mild MR/TR, mildly elevated PASP 36.7 mm Hg   . DDD (degenerative disc disease), cervical   . DDD (degenerative disc disease), lumbar   . Depression   . Diffuse cystic mastopathy 2014  . Dyspnea   . GERD (gastroesophageal reflux disease)   . Headache    rare  . HLD (hyperlipidemia)    a. statin intolerant 2/2 myalgias  . Hypercholesterolemia   . Hypertension   . Malignant neoplasm of upper-outer quadrant of female breast ([place]) 10/2012   Papillary DCIS, sentinel node negative. Selena/PR positive. PARTIAL RIGHT MASTECTOMY FOR BREAST CANCER--HAD RADIATION - NO CHEMO --Selena Bush ONCOLOGIST  . Obesity   . OSA on CPAP   . Osteoarthritis of both knees    a. s/p right TKA 04/2013 & left TKA 09/2014  . Otitis externa   . Personal history of radiation therapy 2015   RIGHT breast-mammosite per pt  . Sleep difficulties    LUNESTA HAS HELPED  . Vaginal cyst      Social History   Socioeconomic History  . Marital status: Widowed    Spouse name: Selena Bush  . Number of children: 2  . Years of education: College  . Highest education level: Bachelor's degree (e.g., BA, AB, BS)  Occupational History  . Occupation: Retired  Scientific laboratory technician  . Financial resource strain: Not hard at all  . Food insecurity:    Worry: Never true    Inability: Never true  . Transportation needs:    Medical: No    Non-medical: No  Tobacco Use  . Smoking status: Never Smoker  .  Smokeless tobacco: Never Used  . Tobacco comment: social smoker as a teen  Substance and Sexual Activity  . Alcohol use: No  . Drug use: No  . Sexual activity: Not Currently    Birth control/protection: Post-menopausal, Surgical  Lifestyle  . Physical activity:    Days per week: 7 days    Minutes per session: 20 min  . Stress: Only a little  Relationships  . Social connections:    Talks on phone: More than three times a week    Gets together: More than three times a week    Attends religious service: More than 4 times per year    Active member of club or  organization: Yes    Attends meetings of clubs or organizations: More than 4 times per year    Relationship status: Widowed  . Intimate partner violence:    Fear of current or ex partner: No    Emotionally abused: No    Physically abused: No    Forced sexual activity: No  Other Topics Concern  . Not on file  Social History Narrative   Marital status: widowed since 09/16/2015      Children: 2 children (82, 84); 5 grandchild (4 in [place]; 1 in [place]).      Lives: alone in townhome; 1 dog, 2 cats      Employment: psychiatric Education officer, museum; retired in 2015      Tobacco: teenager only      Alcohol:  None      Drugs: none      Exercise: walking in 2019; more active in 2019.  Walking daily small amounts.        ADLs: drives; independent with ADLs; no assistant devices      Advanced Directives: none; FULL CODE; no prolonged measures.   Does not need them.  Daughter/Selena Bush is HCPOA.     Past Surgical History:  Procedure Laterality Date  . ABDOMINAL HYSTERECTOMY  1992   DUB; fibroids; endometriosis.  One remaining ovary.    [name] BREAST LUMPECTOMY Right 2015   Papillary DCIS, sentinel node negative. Selena/PR positive. PARTIAL RIGHT MASTECTOMY FOR BREAST CANCER--HAD RADIATION - NO CHEMO --Selena Bush ONCOLOGIST  . BREAST SURGERY Right March 2014   Wide excision,APB RT 10 mm papillary DCIS, ER/PR positive. Sentinel node negative. Partial breast radiation.  [name] CATARACT EXTRACTION, BILATERAL  02/13/2016   Selena Bush.  . CHOLECYSTECTOMY  1994  . COLONOSCOPY  2015   1 benign polyp-every 5 years/ Selena Bush  . ERCP  1995  . JOINT REPLACEMENT Right Sept 2014   knee  . PERICARDIOCENTESIS N/A 11/13/2017   Procedure: PERICARDIOCENTESIS;  Surgeon: Selena Bush, MD;  Location: [facility];  Service: Cardiovascular;  Laterality: N/A;  . TOTAL KNEE ARTHROPLASTY Right 04/16/2013   Procedure: RIGHT TOTAL KNEE ARTHROPLASTY;  Surgeon: Selena Pole, MD;  Location: [facility];  Service: Orthopedics;   Laterality: Right;  . TOTAL KNEE ARTHROPLASTY Left 09/15/2014   Procedure: LEFT TOTAL KNEE ARTHROPLASTY;  Surgeon: Selena Pole, MD;  Location: [facility];  Service: Orthopedics;  Laterality: Left;  . TUBAL LIGATION  1979    Family History  Problem Relation Age of Onset  . Colon cancer Mother   . Cancer Mother 63       colon cancer  . Melanoma Father   . Cancer Father 15       melanoma  . Colon cancer Maternal Grandfather   . Breast cancer Maternal Grandfather  Allergies  Allergen Reactions  . Penicillins Anaphylaxis    anaphylaxis  Has patient had a PCN reaction causing immediate rash, facial/tongue/throat swelling, SOB or lightheadedness with hypotension: Yes Has patient had a PCN reaction causing severe rash involving mucus membranes or skin necrosis: No Has patient had a PCN reaction that required hospitalization: No Has patient had a PCN reaction occurring within the last 10 years: No If all of the above answers are "NO", then may proceed with Cephalosporin use.   . Sulfa Antibiotics Anaphylaxis  . Codeine Other (See Comments) and Nausea And Vomiting    Gi problems   . Statins Other (See Comments)    Leg pains  . Aspirin Other (See Comments)    "burned my stomach intensely" Abdominal pain and burning    Current Outpatient Medications on File Prior to Visit  Medication Sig Dispense Refill  . furosemide (LASIX) 40 MG tablet Take 40 mg by mouth daily.  11  . gabapentin (NEURONTIN) 100 MG capsule Take 1 capsule (100 mg total) by mouth 3 (three) times daily. 90 capsule 2  . Melatonin 5 MG TABS Take by mouth.    . metoprolol tartrate (LOPRESSOR) 25 MG tablet TAKE 3 TABLETS (75 MG) BY MOUTH 2 (TWO) TIMES DAILY. PLEASE SCHEDULE APPT FOR REFILLS 180 tablet 6  . potassium chloride SA (KLOR-CON M20) 20 MEQ tablet Take 2 tablets (40 mEq total) by mouth 2 (two) times daily. (Patient taking differently: Take 20 mEq by mouth daily. ) 120 tablet 2  . sotalol (BETAPACE) 120 MG tablet  Take 1 tablet (120 mg total) by mouth every 12 (twelve) hours. 60 tablet 11  . tiZANidine (ZANAFLEX) 4 MG tablet TAKE 1 TABLET BY MOUTH AT SUPPER AND TAKE 1 TABLET BY MOUTH AT BEDTIME 180 tablet 5  . colchicine 0.6 MG tablet Take 1 tablet (0.6 mg total) by mouth 2 (two) times daily. (Patient taking differently: Take 0.6 mg by mouth 2 (two) times daily as needed (for gout). ) 30 tablet 0  . diltiazem (CARDIZEM CD) 180 MG 24 hr capsule Take 1 capsule (180 mg total) by mouth daily. 30 capsule 5  . DULoxetine (CYMBALTA) 60 MG capsule Take 60 mg by mouth 2 (two) times daily.     . fluticasone (FLONASE) 50 MCG/ACT nasal spray Place 2 sprays into both nostrils daily as needed for rhinitis or allergies. 17 g 11   No current facility-administered medications on file prior to visit.     BP 116/70   Pulse 78   Temp 98.2 F (36.8 C) (Oral)   Ht 5\' 2"  (1.575 m)   Wt 206 lb 8 oz (93.7 kg)   SpO2 96%   BMI 37.77 kg/m    Objective:   Physical Exam  Constitutional: She appears well-nourished.  Neck: Neck supple.  Cardiovascular: Normal rate and regular rhythm.  Respiratory: Effort normal and breath sounds normal.  Skin: Skin is warm and dry.  Psychiatric: She has a normal mood and affect.           Assessment & Plan:

## 2018-03-21 NOTE — Assessment & Plan Note (Signed)
No longer taking Nexium, using Activia daily for symptoms with improvement.

## 2018-03-21 NOTE — Assessment & Plan Note (Signed)
Following with therapist in [place], feels well managed on Cymbalta and Gabapentin, continue same.

## 2018-03-21 NOTE — Assessment & Plan Note (Signed)
Doing well on Melatonin, continue same.

## 2018-03-21 NOTE — Assessment & Plan Note (Signed)
Following with therapist in [place], feels well managed on Cymbalta and Gabapentin, continue same.

## 2018-03-21 NOTE — Assessment & Plan Note (Signed)
Intolerant to statin medications. LDL of 132 in February. She is working on improving her diet.

## 2018-03-26 ENCOUNTER — Telehealth: Payer: Self-pay | Admitting: Cardiology

## 2018-03-26 NOTE — Telephone Encounter (Signed)
Patient called, made aware of advice

## 2018-03-26 NOTE — Telephone Encounter (Signed)
New Message   Pt c/o medication issue:  1. Name of Medication: antibiotic  2. How are you currently taking this medication (dosage and times per day)?   3. Are you having a reaction (difficulty breathing--STAT)? no  4. What is your medication issue? Pt wants to know if its ok for her to take the antibiotic that was prescribed by her dentist. Please call

## 2018-03-26 NOTE — Telephone Encounter (Signed)
No contraindication severe drug-drug interaction expected between 1x dose clindamycin and her cardiac mediation.

## 2018-03-26 NOTE — Telephone Encounter (Signed)
Returned call to patient of Dr. Stanford Breed. Patient was prescribed clindamycin 600mg  one hour prior to any dental treatment by her dentist. Her dentist makes her take this antibiotic and has been doing so for 8 years. She has had 2 knee replacements which is why the dentist prescribed this.  She would like to make sure there are no drug-drug interactions with clindamycin & her current meds  Routed to CV[facility]

## 2018-03-27 ENCOUNTER — Other Ambulatory Visit: Payer: Self-pay | Admitting: *Deleted

## 2018-03-27 ENCOUNTER — Encounter: Payer: Self-pay | Admitting: *Deleted

## 2018-03-27 NOTE — Patient Outreach (Signed)
[facility]) Care Management [place] Telephone Outreach Post-hospital discharge day 98  03/27/2018  LARESHA BACORN 07-10-1946 [id number]  09:55 am:  Unsuccessful telephone outreach attempt to CHANAE GEMMA is a 72 y.o. female 72 y.o.femalereferred to [place] after 2 recent hospitalizations:  March 31- April 8, 2068for hemorrhagic pericardial effusion with cardiac tamponade secondary to anticoagulation therapy; patient was discharged to [facility] April 26- May 7, 201for Acute Respiratory failure with hypoxia/ hypercarbia secondary to pneumonia, and A-Fib with RVR; patient was discharged to SNF   Patient has history including, but not limited to, HTN/ HLD; CHF; A-Fib; OSA on CPAP; GERD; morbid obesity; and depression/ anxiety.    HIPAA compliant voice mail message left for patient, requesting return call back.  Plan:  Will place [facility] CM unsuccessful patient outreach letter in mail requesting call back in writing  Will re-attempt [place] telephone outreach later this week if I do not hear back from patient first.  Oneta Rack, RN, BSN, [place] Coordinator [facility] Care Management  [phone]

## 2018-03-28 ENCOUNTER — Telehealth: Payer: Self-pay | Admitting: Primary Care

## 2018-03-28 DIAGNOSIS — R269 Unspecified abnormalities of gait and mobility: Secondary | ICD-10-CM | POA: Diagnosis not present

## 2018-03-28 DIAGNOSIS — F324 Major depressive disorder, single episode, in partial remission: Secondary | ICD-10-CM

## 2018-03-28 DIAGNOSIS — I5030 Unspecified diastolic (congestive) heart failure: Secondary | ICD-10-CM | POA: Diagnosis not present

## 2018-03-28 DIAGNOSIS — F419 Anxiety disorder, unspecified: Secondary | ICD-10-CM

## 2018-03-28 DIAGNOSIS — J9601 Acute respiratory failure with hypoxia: Secondary | ICD-10-CM | POA: Diagnosis not present

## 2018-03-28 MED ORDER — DULOXETINE HCL 60 MG PO CPEP: 60.0000 mg | ORAL_CAPSULE | Freq: Two times a day (BID) | ORAL | 1 refills | Status: DC

## 2018-03-28 NOTE — Telephone Encounter (Signed)
Copied from [place] [id number]. Topic: Quick Communication - Rx Refill/Question >> Mar 28, 2018 11:57 AM Alfredia Ferguson R wrote: Medication: DULoxetine (CYMBALTA) 60 MG capsule  Has the patient contacted their pharmacy? Yes  Preferred Pharmacy (with phone number or street name): [id number] [place], [place] - [address] AT [facility] [phone] (Phone) [phone] (Fax)   Agent: Please be advised that RX refills may take up to 3 business days. We ask that you follow-up with your pharmacy.

## 2018-03-28 NOTE — Telephone Encounter (Signed)
Medication have not been prescribed by Anda Kraft. Last OV on 03/21/2018

## 2018-03-28 NOTE — Telephone Encounter (Signed)
Noted, refill sent to pharmacy. 

## 2018-03-30 ENCOUNTER — Other Ambulatory Visit: Payer: Self-pay | Admitting: *Deleted

## 2018-03-30 ENCOUNTER — Encounter: Payer: Self-pay | Admitting: *Deleted

## 2018-03-30 NOTE — Patient Outreach (Signed)
[facility]) Care Management [place] Telephone Outreach Case Closure 03/30/2018  Selena Bush 21-Dec-1945 [id number]  11:15 am:  Unsuccessful telephone outreach attempt to Selena Bush, 72 y.o. female 72 y.o.femalereferred to [place] after 2 recent hospitalizations:  March 31- April 8, 2089fr hemorrhagic pericardial effusion with cardiac tamponade secondary to anticoagulation therapy; [facility] failure with hypoxia/ hypercarbia secondary to pneumonia, and A-Fib with RVR; patient was discharged to SNF   Patient has history including, but not limited to, HTN/ HLD; CHF; A-Fib; OSA on CPAP; GERD; morbid obesity; and depression/ anxiety.    HIPAA compliant voice mail message left for patient, requesting return call back.  2:20/ 2:50 pm:  Patient returned my call from earlier today and left voice mail message requesting call back.  Returned patient's call within 30 minutes of receiving her voice message;  HIPAA/ identity verified with patient during phone call this afternoon.  Today, patient reports that she "is still doing absolutely great."  Patient denies pain and new/ recent falls; states she has "been getting out more," and she sounds to be in no obvious distress throughout entirety of phone call today.  Patient further reports:  --Has all medicationsand takes as prescribed;denies questions/ concerns/ recent changesaroundcurrent medications.  States that she has been working with her pharmacy to have pill-packs delivered to her home, which she is very excited about; states this "will start next week"  -- attended recent provider appointments for new PCP: states that she "loves her new PCP;' adding "she is fabulous."  Patient particularly happy that new PCP is so close to her home, within 2 miles from where she lives.  Reports no upcoming provider appointments.  Self-health  management ofchronic disease state of CHF and A-Fib: -- confirms that she hascontinued monitoring butis stillNOT recording daily weights; patient states again and is adamant that she "can remember form one week to the next."  Patient is able to independently verbalize weight gain guidelines in setting of CHF, as well as signs/ symptoms yellow CHF zone along with corresponding action plan.  Patient sounds to be/ and reports that she is "in green zone" today. -- denies recent episodes AF; states no signs/ symptoms-- able to verbalize signs/ symptoms AF independently.  Patient denies further issues, concerns, or problems today. We again discussed that patient has thus far met her previously established [place]N CM goals, and had previously discussed possibility of TH[place] Acresase closure with transfer/ referral to TH[place]villehowever, today, patient declines this option, stating that she "feels good" about her progress and adds that she is active with her friends/ church and does not want to have "regular phone calls" form anyone; states that she will contact her new PCP if she "runs into trouble."  Despite my encouragement for her to engage with THTempletonpatient refuses participation today.  Iconfirmed that patient hasmy direct phone number,the THN CM 24-hour nurse advice phone number,andthe main TH[place]M office phone number should issues arise in the future.  Plan:  Will close TH[place]ro[place], as patient has successfully met her previously established goals, and denies further care coordination needs and has declined THHendersonvilleand will make patient's PCP aware of same-- will send case closure letter  [facility] CenterM Care Plan Problem Two     Most Recent Value  Care Plan Problem Two  Knowledge deficit related  to new diagnosis' of CHF and A-fib, as evidenced by patient reporitng of same  Role Documenting the Problem Two  Care Management [place] for Problem Two  Not Active  THN CM Short Term Goal #2   Over the next 14 days, patient will attend new PCP provider office visit, and will contact care providers for any new concerns, issues or problems that arise, as evidenced by patient reporting, review of EMR, and collaboration with providers as indicated, during [place] outreach  [facility] CM Short Term Goal #2 Start Date  03/13/18  [facility] CM Short Term Goal #2 Met Date  03/30/18 [Goal Met]  Interventions for Short Term Goal #2  Confirmed that patient attended recent PCP provider office visit and has no questions/ concerns,  strongly encouraged patient to allow me to place [place] referral for ongoing follow up, however patient declined     Oneta Rack, RN, BSN, [employer] Coordinator [facility] Care Management  [phone]

## 2018-04-11 ENCOUNTER — Ambulatory Visit: Payer: PPO | Admitting: Family Medicine

## 2018-04-11 DIAGNOSIS — F331 Major depressive disorder, recurrent, moderate: Secondary | ICD-10-CM | POA: Diagnosis not present

## 2018-04-11 DIAGNOSIS — F4312 Post-traumatic stress disorder, chronic: Secondary | ICD-10-CM | POA: Diagnosis not present

## 2018-04-13 ENCOUNTER — Telehealth: Payer: Self-pay

## 2018-04-13 NOTE — Telephone Encounter (Signed)
Copied from [place] [phone] [id number]. Topic: General - Other >> Apr 13, 2018  3:18 PM Yvette Rack wrote: Reason for CRM: pt calling stating that she would like for [place] to [facility] a [facility] and the supplies that come with it she states that she doesn't use them anymore she states that Dr Allegra Grana was suppose to have done this before she left [place] but she didn't  please give pt a call back at [phone]

## 2018-04-13 NOTE — Telephone Encounter (Signed)
She shouldn't need a RX, she should be able to call them and tell them to come pick it up. I have written a RX though and placed in CS box

## 2018-04-16 ENCOUNTER — Other Ambulatory Visit: Payer: Self-pay | Admitting: Cardiology

## 2018-04-17 NOTE — Telephone Encounter (Signed)
Pt states she cannot pick up and requesting we sent to [facility]. Phone # [phone] Fax # [phone]

## 2018-04-17 NOTE — Telephone Encounter (Signed)
Message left for patient to return my call.  

## 2018-04-17 NOTE — Telephone Encounter (Signed)
Rx request sent to pharmacy.  

## 2018-04-17 NOTE — Telephone Encounter (Signed)
Faxed Rx to [facility] as requested.

## 2018-04-23 ENCOUNTER — Encounter: Payer: Self-pay | Admitting: Cardiology

## 2018-04-23 ENCOUNTER — Telehealth: Payer: Self-pay | Admitting: Cardiology

## 2018-04-23 MED ORDER — POTASSIUM CHLORIDE CRYS ER 20 MEQ PO TBCR: 40.0000 meq | EXTENDED_RELEASE_TABLET | Freq: Two times a day (BID) | ORAL | 5 refills | Status: DC

## 2018-04-23 NOTE — Telephone Encounter (Signed)
New Message        Pharmacist calling to verify the dosage of (Klor-con 20 mg) one a day/patient previoulys on twice a day, need clarification

## 2018-04-23 NOTE — Telephone Encounter (Signed)
Called patient to clarify potassium dose. Previous Rx(s) were for 51mEq BID but was changed at June visit to 2mEq QD (under patient taking differently)  LM for patient to call back to clarify

## 2018-04-23 NOTE — Telephone Encounter (Signed)
Patient takes K+ 6mEq - 2 tablets BID. Rx updated via MyChart encounter

## 2018-04-23 NOTE — Telephone Encounter (Signed)
New Message   Pt c/o medication issue:  1. Name of Medication: potassium chloride SA (KLOR-CON M20) 20 MEQ tablet  2. How are you currently taking this medication (dosage and times per day)? Take 1 tablet (20 mEq total) by mouth daily  3. Are you having a reaction (difficulty breathing--STAT)? no  4. What is your medication issue?  [facility] 4 tablets a day but is now only taking 1 tablet a day and they want to verify the dosage. Please call

## 2018-04-23 NOTE — Telephone Encounter (Signed)
Duplicate encounter. Waiting for patient to call back to clarify.

## 2018-04-30 NOTE — Progress Notes (Signed)
HPI: Follow-up diastolic congestive heart failure, paroxysmal atrial fibrillation, pericardial effusion, hypertension and hyperlipidemia.  Patient was admitted March 2019 with atypical chest pain and paroxysmal atrial fibrillation.  She was treated with sotalol and Xarelto.  However she returned with significant dyspnea and follow-up echocardiogram showed large pericardial effusion with tamponade physiology.  She had pericardiocentesis on April 1.  Cytology was negative.  At that time we felt it was likely her initial presentation was pericarditis and she had a hemorrhagic pericardial effusion from initiation of anticoagulation.  We felt her atrial fibrillation may improve once pericardial process resolved.  Patient was admitted with pneumonia and recurrent atrial fibrilllation to an outside hospital.  Follow-up echocardiogram April 2019 showed normal LV function, moderate left ventricular hypertrophy, mild tricuspid regurgitation, small pericardial effusion and small pleural effusion.  Patient treated with antibiotics with improvement.  Also treated with Lasix.  Since last seen she has some dyspnea on exertion but no orthopnea, PND, pedal edema, chest pain, palpitations or syncope.  Current Outpatient Medications  Medication Sig Dispense Refill  . colchicine 0.6 MG tablet Take 1 tablet (0.6 mg total) by mouth 2 (two) times daily. (Patient taking differently: Take 0.6 mg by mouth 2 (two) times daily as needed (for gout). ) 30 tablet 0  . diltiazem (CARDIZEM CD) 180 MG 24 hr capsule Take 1 capsule (180 mg total) by mouth daily. 30 capsule 5  . DULoxetine (CYMBALTA) 60 MG capsule Take 1 capsule (60 mg total) by mouth 2 (two) times daily. 180 capsule 1  . fluticasone (FLONASE) 50 MCG/ACT nasal spray Place 2 sprays into both nostrils daily as needed for rhinitis or allergies. 17 g 11  . furosemide (LASIX) 40 MG tablet Take 40 mg by mouth daily.  11  . gabapentin (NEURONTIN) 100 MG capsule Take 1  capsule (100 mg total) by mouth 3 (three) times daily. 90 capsule 2  . Melatonin 5 MG TABS Take by mouth.    . metoprolol tartrate (LOPRESSOR) 25 MG tablet TAKE 3 TABLETS (75 MG) BY MOUTH 2 (TWO) TIMES DAILY. PLEASE SCHEDULE APPT FOR REFILLS 180 tablet 6  . potassium chloride SA (KLOR-CON M20) 20 MEQ tablet Take 2 tablets (40 mEq total) by mouth 2 (two) times daily. 120 tablet 5  . tiZANidine (ZANAFLEX) 4 MG tablet TAKE 1 TABLET BY MOUTH AT SUPPER AND TAKE 1 TABLET BY MOUTH AT BEDTIME 180 tablet 5  . sotalol (BETAPACE) 120 MG tablet Take 1 tablet (120 mg total) by mouth every 12 (twelve) hours. 60 tablet 11   No current facility-administered medications for this visit.      Past Medical History:  Diagnosis Date  . Anxiety   . Atrial fibrillation ([place])   . Chronic diastolic CHF (congestive heart failure) ([place])    a. echo 09/2014: EF 09-47%, diastolic dysfunction, mild LVH, nl RV size & systolic function, mildly dilated LA (4.3 cm), mild MR/TR, mildly elevated PASP 36.7 mm Hg  . DDD (degenerative disc disease), cervical   . DDD (degenerative disc disease), lumbar   . Depression   . Diffuse cystic mastopathy 2014  . Dyspnea   . GERD (gastroesophageal reflux disease)   . Headache    rare  . HLD (hyperlipidemia)    a. statin intolerant 2/2 myalgias  . Hypercholesterolemia   . Hypertension   . Malignant neoplasm of upper-outer quadrant of female breast ([place]) 10/2012   Papillary DCIS, sentinel node negative. DR/PR positive. PARTIAL RIGHT MASTECTOMY FOR BREAST CANCER--HAD RADIATION -  NO CHEMO --DR. [place] ONCOLOGIST  . Obesity   . OSA on CPAP   . Osteoarthritis of both knees    a. s/p right TKA 04/2013 & left TKA 09/2014  . Otitis externa   . Personal history of radiation therapy 2015   RIGHT breast-mammosite per pt  . Sleep difficulties    LUNESTA HAS HELPED  . Vaginal cyst     Past Surgical History:  Procedure Laterality Date  . ABDOMINAL HYSTERECTOMY  1992   DUB;  fibroids; endometriosis.  One remaining ovary.    [name] BREAST LUMPECTOMY Right 2015   Papillary DCIS, sentinel node negative. DR/PR positive. PARTIAL RIGHT MASTECTOMY FOR BREAST CANCER--HAD RADIATION - NO CHEMO --DR. [place] ONCOLOGIST  . BREAST SURGERY Right March 2014   Wide excision,APB RT 10 mm papillary DCIS, ER/PR positive. Sentinel node negative. Partial breast radiation.  [name] CATARACT EXTRACTION, BILATERAL  02/13/2016   [facility].  . CHOLECYSTECTOMY  1994  . COLONOSCOPY  2015   1 benign polyp-every 5 years/ Dr Candace Cruise  . ERCP  1995  . JOINT REPLACEMENT Right Sept 2014   knee  . PERICARDIOCENTESIS N/A 11/13/2017   Procedure: PERICARDIOCENTESIS;  Surgeon: Nelva Bush, MD;  Location: [facility];  Service: Cardiovascular;  Laterality: N/A;  . TOTAL KNEE ARTHROPLASTY Right 04/16/2013   Procedure: RIGHT TOTAL KNEE ARTHROPLASTY;  Surgeon: Mauri Pole, MD;  Location: [facility];  Service: Orthopedics;  Laterality: Right;  . TOTAL KNEE ARTHROPLASTY Left 09/15/2014   Procedure: LEFT TOTAL KNEE ARTHROPLASTY;  Surgeon: Mauri Pole, MD;  Location: [facility];  Service: Orthopedics;  Laterality: Left;  . TUBAL LIGATION  1979    Social History   Socioeconomic History  . Marital status: Widowed    Spouse name: Chrissie Noa  . Number of children: 2  . Years of education: College  . Highest education level: Bachelor's degree (e.g., BA, AB, BS)  Occupational History  . Occupation: Retired  Scientific laboratory technician  . Financial resource strain: Not hard at all  . Food insecurity:    Worry: Never true    Inability: Never true  . Transportation needs:    Medical: No    Non-medical: No  Tobacco Use  . Smoking status: Never Smoker  . Smokeless tobacco: Never Used  . Tobacco comment: social smoker as a teen  Substance and Sexual Activity  . Alcohol use: No  . Drug use: No  . Sexual activity: Not Currently    Birth control/protection: Post-menopausal, Surgical  Lifestyle  . Physical activity:     Days per week: 7 days    Minutes per session: 20 min  . Stress: Only a little  Relationships  . Social connections:    Talks on phone: More than three times a week    Gets together: More than three times a week    Attends religious service: More than 4 times per year    Active member of club or organization: Yes    Attends meetings of clubs or organizations: More than 4 times per year    Relationship status: Widowed  . Intimate partner violence:    Fear of current or ex partner: No    Emotionally abused: No    Physically abused: No    Forced sexual activity: No  Other Topics Concern  . Not on file  Social History Narrative   Marital status: widowed since 09/16/2015      Children: 2 children (69, 37); 5 grandchild (4 in [place]; 1  in [place]).      Lives: alone in townhome; 1 dog, 2 cats      Employment: psychiatric Education officer, museum; retired in 2015      Tobacco: teenager only      Alcohol:  None      Drugs: none      Exercise: walking in 2019; more active in 2019.  Walking daily small amounts.        ADLs: drives; independent with ADLs; no assistant devices      Advanced Directives: none; FULL CODE; no prolonged measures.   Does not need them.  Daughter/Heather Vista Deck is HCPOA.     Family History  Problem Relation Age of Onset  . Colon cancer Mother   . Cancer Mother 19       colon cancer  . Melanoma Father   . Cancer Father 35       melanoma  . Colon cancer Maternal Grandfather   . Breast cancer Maternal Grandfather     ROS: no fevers or chills, productive cough, hemoptysis, dysphasia, odynophagia, melena, hematochezia, dysuria, hematuria, rash, seizure activity, orthopnea, PND, pedal edema, claudication. Remaining systems are negative.  Physical Exam: Well-developed well-nourished in no acute distress.  Skin is warm and dry.  HEENT is normal.  Neck is supple.  Chest is clear to auscultation with normal expansion.  Cardiovascular exam is regular rate and rhythm.    Abdominal exam nontender or distended. No masses palpated. Extremities show no edema. neuro grossly intact  ECG-normal sinus rhythm at a rate of 66.  RV conduction delay.  Nonspecific T wave changes.  Personally reviewed  A/P  1 paroxysmal atrial fibrillation-patient remains in sinus rhythm today.  Continue metoprolol and Cardizem.  Continue sotalol.  We have elected not to anticoagulate (she presented with pericarditis previously and was placed on Xarelto which led to hemorrhagic pericardial effusion).  I am hopeful that previous atrial fibrillation was related to pericarditis and she will not have recurrent episodes in the future since pericarditis has improved.  2 chronic diastolic congestive heart failure-she appears to be euvolemic today.  Continue present dose of Lasix and follow.  3 previous hemorrhagic pericardial effusion-most recent echocardiogram showed small effusion.  Repeat echocardiogram.  Discontinue colchicine.  4 hypertension-patient's blood pressure is controlled.  Continue present medications and follow.  [name], MD

## 2018-05-01 DIAGNOSIS — F331 Major depressive disorder, recurrent, moderate: Secondary | ICD-10-CM | POA: Diagnosis not present

## 2018-05-01 DIAGNOSIS — F4312 Post-traumatic stress disorder, chronic: Secondary | ICD-10-CM | POA: Diagnosis not present

## 2018-05-07 ENCOUNTER — Encounter: Payer: Self-pay | Admitting: Cardiology

## 2018-05-07 ENCOUNTER — Ambulatory Visit (INDEPENDENT_AMBULATORY_CARE_PROVIDER_SITE_OTHER): Payer: PPO | Admitting: Cardiology

## 2018-05-07 VITALS — BP 102/74 | HR 66 | Ht 62.0 in | Wt 206.0 lb

## 2018-05-07 DIAGNOSIS — I5032 Chronic diastolic (congestive) heart failure: Secondary | ICD-10-CM | POA: Diagnosis not present

## 2018-05-07 DIAGNOSIS — I480 Paroxysmal atrial fibrillation: Secondary | ICD-10-CM

## 2018-05-07 DIAGNOSIS — I313 Pericardial effusion (noninflammatory): Secondary | ICD-10-CM | POA: Diagnosis not present

## 2018-05-07 DIAGNOSIS — I314 Cardiac tamponade: Secondary | ICD-10-CM | POA: Diagnosis not present

## 2018-05-07 DIAGNOSIS — I10 Essential (primary) hypertension: Secondary | ICD-10-CM | POA: Diagnosis not present

## 2018-05-07 DIAGNOSIS — I3139 Other pericardial effusion (noninflammatory): Secondary | ICD-10-CM

## 2018-05-07 NOTE — Patient Instructions (Signed)
Medication Instructions:   NO CHANGE  Testing/Procedures:  Your physician has requested that you have an echocardiogram. Echocardiography is a painless test that uses sound waves to create images of your heart. It provides your doctor with information about the size and shape of your heart and how well your heart's chambers and valves are working. This procedure takes approximately one hour. There are no restrictions for this procedure.    Follow-Up:  Your physician wants you to follow-up in: 4 MONTHS WITH DR CRENSHAW You will receive a reminder letter in the mail two months in advance. If you don't receive a letter, please call our office to schedule the follow-up appointment.   If you need a refill on your cardiac medications before your next appointment, please call your pharmacy.    

## 2018-05-21 MED ORDER — DILTIAZEM HCL ER COATED BEADS 180 MG PO CP24: 180.0000 mg | ORAL_CAPSULE | Freq: Every day | ORAL | 2 refills | Status: DC

## 2018-05-23 ENCOUNTER — Ambulatory Visit (HOSPITAL_COMMUNITY): Payer: PPO | Attending: Cardiovascular Disease

## 2018-05-23 ENCOUNTER — Other Ambulatory Visit: Payer: Self-pay

## 2018-05-23 DIAGNOSIS — I314 Cardiac tamponade: Secondary | ICD-10-CM | POA: Insufficient documentation

## 2018-05-23 DIAGNOSIS — I313 Pericardial effusion (noninflammatory): Secondary | ICD-10-CM | POA: Diagnosis not present

## 2018-05-23 DIAGNOSIS — I3139 Other pericardial effusion (noninflammatory): Secondary | ICD-10-CM

## 2018-06-07 DIAGNOSIS — F331 Major depressive disorder, recurrent, moderate: Secondary | ICD-10-CM | POA: Diagnosis not present

## 2018-06-07 DIAGNOSIS — F4312 Post-traumatic stress disorder, chronic: Secondary | ICD-10-CM | POA: Diagnosis not present

## 2018-06-12 ENCOUNTER — Other Ambulatory Visit: Payer: Self-pay | Admitting: Primary Care

## 2018-06-12 DIAGNOSIS — F419 Anxiety disorder, unspecified: Secondary | ICD-10-CM

## 2018-06-12 DIAGNOSIS — F324 Major depressive disorder, single episode, in partial remission: Secondary | ICD-10-CM

## 2018-06-12 NOTE — Telephone Encounter (Signed)
Copied from [place]. Topic: General - Other >> Jun 12, 2018  3:16 PM Selena Bush wrote: Medication: gabapentin (NEURONTIN) 100 MG capsule  Has the patient contacted their pharmacy? Yes  Preferred Pharmacy (with phone number or street name):  [id number] [place], [place] - [address] AT [address]  [phone] (Phone) [phone] (Fax)    Agent: Please be advised that RX refills may take up to 3 business days. We ask that you follow-up with your pharmacy.

## 2018-06-13 MED ORDER — GABAPENTIN 100 MG PO CAPS: 100.0000 mg | ORAL_CAPSULE | Freq: Three times a day (TID) | ORAL | 2 refills | Status: DC

## 2018-06-13 NOTE — Telephone Encounter (Signed)
Noted, refills sent to pharmacy. 

## 2018-06-13 NOTE — Telephone Encounter (Signed)
Last office visit 03/21/2018 for Establish Care.  Last refilled 02/14/2018 for #90 with 2 refills by Dr. Reginia Forts.  CPE scheduled for 10/19/2018.  Ok to refill?

## 2018-06-14 ENCOUNTER — Telehealth: Payer: Self-pay | Admitting: Primary Care

## 2018-06-14 DIAGNOSIS — F324 Major depressive disorder, single episode, in partial remission: Secondary | ICD-10-CM

## 2018-06-14 DIAGNOSIS — F419 Anxiety disorder, unspecified: Secondary | ICD-10-CM

## 2018-06-14 MED ORDER — GABAPENTIN 100 MG PO CAPS: 100.0000 mg | ORAL_CAPSULE | Freq: Three times a day (TID) | ORAL | 2 refills | Status: DC

## 2018-06-14 NOTE — Telephone Encounter (Signed)
Please resend Rx- failed transmission of prescription.

## 2018-06-14 NOTE — Telephone Encounter (Signed)
Anda Kraft I tried to re-sent this but it failed again. Can you try? Because one time it worked for one patient and another time it didn't . If it failed again then I will call.

## 2018-06-14 NOTE — Telephone Encounter (Signed)
I resent and it did not go through.

## 2018-06-14 NOTE — Telephone Encounter (Signed)
Copied from [place] [id number]. Topic: Quick Communication - Rx Refill/Question >> Jun 14, 2018 10:03 AM Miche Stare wrote: Medicationgabapentin (NEURONTIN) 100 MG capsule     showing RX was sent on 06/13/18 but the transmission failed resend please   Has the patient contacted their pharmacy yes    [facility]   Agent: Please be advised that RX refills may take up to 3 business days. We ask that you follow-up with your pharmacy.

## 2018-06-15 MED ORDER — GABAPENTIN 100 MG PO CAPS: 100.0000 mg | ORAL_CAPSULE | Freq: Three times a day (TID) | ORAL | 2 refills | Status: DC

## 2018-06-15 NOTE — Telephone Encounter (Signed)
Spoken to patient that this need to [facility] [place] so they have it fill in a pill pack for patient. Print Rx and [facility]ed the [facility] at [phone]

## 2018-06-21 DIAGNOSIS — F4312 Post-traumatic stress disorder, chronic: Secondary | ICD-10-CM | POA: Diagnosis not present

## 2018-06-21 DIAGNOSIS — F331 Major depressive disorder, recurrent, moderate: Secondary | ICD-10-CM | POA: Diagnosis not present

## 2018-07-05 DIAGNOSIS — F331 Major depressive disorder, recurrent, moderate: Secondary | ICD-10-CM | POA: Diagnosis not present

## 2018-07-05 DIAGNOSIS — F4312 Post-traumatic stress disorder, chronic: Secondary | ICD-10-CM | POA: Diagnosis not present

## 2018-07-09 ENCOUNTER — Telehealth: Payer: Self-pay | Admitting: Primary Care

## 2018-07-09 DIAGNOSIS — G4733 Obstructive sleep apnea (adult) (pediatric): Secondary | ICD-10-CM

## 2018-07-09 NOTE — Telephone Encounter (Signed)
Please notify patient that I'm happy to put in the order for the sleep study without an office visit, but I need some additional information.   Does she have daytime drowsiness? Snoring? Is she waking up in the middle of the night? Gasping for air?

## 2018-07-09 NOTE — Telephone Encounter (Signed)
Pt calling office  Pt have a referral in epic for sleep study from previous pcp doctor that wasn't processed , pt want to know if you can put in another referral by you for sleep study or does she need to come in for an appt in order to get that referral processed. Please advise pt.

## 2018-07-10 NOTE — Telephone Encounter (Signed)
Message left for patient to return my call.  

## 2018-07-10 NOTE — Telephone Encounter (Signed)
Noted, referral placed.  

## 2018-07-10 NOTE — Telephone Encounter (Signed)
Patient called back. She stated that she need a sleep study so she can get a new Cpap machine. Patient does feel drowsiness sometime.yes, patient does snore. She wakes up 3-4 times a night gasping for air.  Patient stated that she would to request to have the sleep study to be done in her home and to contact patient on her cell number.

## 2018-07-24 ENCOUNTER — Ambulatory Visit (INDEPENDENT_AMBULATORY_CARE_PROVIDER_SITE_OTHER): Payer: PPO | Admitting: Primary Care

## 2018-07-24 VITALS — BP 122/74 | HR 68 | Temp 98.2°F | Ht 62.0 in | Wt 210.2 lb

## 2018-07-24 DIAGNOSIS — F331 Major depressive disorder, recurrent, moderate: Secondary | ICD-10-CM | POA: Diagnosis not present

## 2018-07-24 DIAGNOSIS — F4312 Post-traumatic stress disorder, chronic: Secondary | ICD-10-CM | POA: Diagnosis not present

## 2018-07-24 DIAGNOSIS — J069 Acute upper respiratory infection, unspecified: Secondary | ICD-10-CM

## 2018-07-24 MED ORDER — BENZONATATE 200 MG PO CAPS: 200.0000 mg | ORAL_CAPSULE | Freq: Three times a day (TID) | ORAL | 0 refills | Status: DC | PRN

## 2018-07-24 NOTE — Patient Instructions (Addendum)
Start plain loratadine (Claritin) tablets for allergies.  Continue Flonase for sinus pressure and ear pressure.   You may take Benzonatate capsules for cough. Take 1 capsule by mouth three times daily as needed for cough.  Please call me Friday this week if you feel worse. Call me sooner if you start to run fevers of 101 or higher.  Make sure to drink plenty of water and rest.  It was a pleasure to see you today!

## 2018-07-24 NOTE — Progress Notes (Signed)
Subjective:    Patient ID: Selena Bush, female    DOB: [birth year], 72 y.o.   MRN: [id number]  HPI  Selena Bush is a 72 year old female with a history of CHF, pericardial effusion, Hypertension, GERD who presents today with a chief complaint of cough.  She also reports sinus pressure, sore throat, hoarse voice, rhinorrhea. Her symptoms began about one week ago with rhinorrhea. She's been taking Flonase and Tylenol with some improvement. She is worried about developing another pericardial effusion which was suspected to be secondary to viral infection that turned into pneumonia.   BP Readings from Last 3 Encounters:  07/24/18 122/74  05/07/18 102/74  03/21/18 116/70   She denies fevers, palpitations, chest pain, dizziness.   Review of Systems  Constitutional: Negative for fever.  HENT: Positive for congestion, rhinorrhea and sinus pressure.   Respiratory: Positive for cough. Negative for shortness of breath.   Cardiovascular: Negative for chest pain and palpitations.  Neurological: Negative for dizziness.       Past Medical History:  Diagnosis Date  . Anxiety   . Atrial fibrillation ([place])   . Chronic diastolic CHF (congestive heart failure) ([place])    a. echo 09/2014: EF 54-65%, diastolic dysfunction, mild LVH, nl RV size & systolic function, mildly dilated LA (4.3 cm), mild MR/TR, mildly elevated PASP 36.7 mm Hg  . DDD (degenerative disc disease), cervical   . DDD (degenerative disc disease), lumbar   . Depression   . Diffuse cystic mastopathy 2014  . Dyspnea   . GERD (gastroesophageal reflux disease)   . Headache    rare  . HLD (hyperlipidemia)    a. statin intolerant 2/2 myalgias  . Hypercholesterolemia   . Hypertension   . Malignant neoplasm of upper-outer quadrant of female breast ([place]) 10/2012   Papillary DCIS, sentinel node negative. DR/PR positive. PARTIAL RIGHT MASTECTOMY FOR BREAST CANCER--HAD RADIATION - NO CHEMO --DR. [place] ONCOLOGIST  . Obesity     . OSA on CPAP   . Osteoarthritis of both knees    a. s/p right TKA 04/2013 & left TKA 09/2014  . Otitis externa   . Personal history of radiation therapy 2015   RIGHT breast-mammosite per pt  . Sleep difficulties    LUNESTA HAS HELPED  . Vaginal cyst      Social History   Socioeconomic History  . Marital status: Widowed    Spouse name: Chrissie Noa  . Number of children: 2  . Years of education: College  . Highest education level: Bachelor's degree (e.g., BA, AB, BS)  Occupational History  . Occupation: Retired  Scientific laboratory technician  . Financial resource strain: Not hard at all  . Food insecurity:    Worry: Never true    Inability: Never true  . Transportation needs:    Medical: No    Non-medical: No  Tobacco Use  . Smoking status: Never Smoker  . Smokeless tobacco: Never Used  . Tobacco comment: social smoker as a teen  Substance and Sexual Activity  . Alcohol use: No  . Drug use: No  . Sexual activity: Not Currently    Birth control/protection: Post-menopausal, Surgical  Lifestyle  . Physical activity:    Days per week: 7 days    Minutes per session: 20 min  . Stress: Only a little  Relationships  . Social connections:    Talks on phone: More than three times a week    Gets together: More than three times a week  Attends religious service: More than 4 times per year    Active member of club or organization: Yes    Attends meetings of clubs or organizations: More than 4 times per year    Relationship status: Widowed  . Intimate partner violence:    Fear of current or ex partner: No    Emotionally abused: No    Physically abused: No    Forced sexual activity: No  Other Topics Concern  . Not on file  Social History Narrative   Marital status: widowed since 09/16/2015      Children: 2 children (84, 28); 5 grandchild (4 in [place]; 1 in [place]).      Lives: alone in townhome; 1 dog, 2 cats      Employment: psychiatric Education officer, museum; retired in 2015      Tobacco:  teenager only      Alcohol:  None      Drugs: none      Exercise: walking in 2019; more active in 2019.  Walking daily small amounts.        ADLs: drives; independent with ADLs; no assistant devices      Advanced Directives: none; FULL CODE; no prolonged measures.   Does not need them.  Daughter/Heather Vista Deck is HCPOA.     Past Surgical History:  Procedure Laterality Date  . ABDOMINAL HYSTERECTOMY  1992   DUB; fibroids; endometriosis.  One remaining ovary.    [name] BREAST LUMPECTOMY Right 2015   Papillary DCIS, sentinel node negative. DR/PR positive. PARTIAL RIGHT MASTECTOMY FOR BREAST CANCER--HAD RADIATION - NO CHEMO --DR. [place] ONCOLOGIST  . BREAST SURGERY Right March 2014   Wide excision,APB RT 10 mm papillary DCIS, ER/PR positive. Sentinel node negative. Partial breast radiation.  [name] CATARACT EXTRACTION, BILATERAL  02/13/2016   [facility].  . CHOLECYSTECTOMY  1994  . COLONOSCOPY  2015   1 benign polyp-every 5 years/ Dr Candace Cruise  . ERCP  1995  . JOINT REPLACEMENT Right Sept 2014   knee  . PERICARDIOCENTESIS N/A 11/13/2017   Procedure: PERICARDIOCENTESIS;  Surgeon: Nelva Bush, MD;  Location: [facility];  Service: Cardiovascular;  Laterality: N/A;  . TOTAL KNEE ARTHROPLASTY Right 04/16/2013   Procedure: RIGHT TOTAL KNEE ARTHROPLASTY;  Surgeon: Mauri Pole, MD;  [facility];  Service: Orthopedics;  Laterality: Right;  . TOTAL KNEE ARTHROPLASTY Left 09/15/2014   Procedure: LEFT TOTAL KNEE ARTHROPLASTY;  Surgeon: Mauri Pole, MD;  [facility];  Service: Orthopedics;  Laterality: Left;  . TUBAL LIGATION  1979    Family History  Problem Relation Age of Onset  . Colon cancer Mother   . Cancer Mother 74       colon cancer  . Melanoma Father   . Cancer Father 80       melanoma  . Colon cancer Maternal Grandfather   . Breast cancer Maternal Grandfather     Allergies  Allergen Reactions  . Penicillins Anaphylaxis    anaphylaxis  Has patient had a PCN  reaction causing immediate rash, facial/tongue/throat swelling, SOB or lightheadedness with hypotension: Yes Has patient had a PCN reaction causing severe rash involving mucus membranes or skin necrosis: No Has patient had a PCN reaction that required hospitalization: No Has patient had a PCN reaction occurring within the last 10 years: No If all of the above answers are "NO", then may proceed with Cephalosporin use.   . Sulfa Antibiotics Anaphylaxis  . Codeine Other (See Comments) and Nausea And Vomiting  Gi problems   . Statins Other (See Comments)    Leg pains  . Aspirin Other (See Comments)    "burned my stomach intensely" Abdominal pain and burning    Current Outpatient Medications on File Prior to Visit  Medication Sig Dispense Refill  . colchicine 0.6 MG tablet Take 1 tablet (0.6 mg total) by mouth 2 (two) times daily. (Patient taking differently: Take 0.6 mg by mouth 2 (two) times daily as needed (for gout). ) 30 tablet 0  . diltiazem (CARDIZEM CD) 180 MG 24 hr capsule Take 1 capsule (180 mg total) by mouth daily. 90 capsule 2  . DULoxetine (CYMBALTA) 60 MG capsule Take 1 capsule (60 mg total) by mouth 2 (two) times daily. 180 capsule 1  . fluticasone (FLONASE) 50 MCG/ACT nasal spray Place 2 sprays into both nostrils daily as needed for rhinitis or allergies. 17 g 11  . furosemide (LASIX) 40 MG tablet Take 40 mg by mouth daily.  11  . gabapentin (NEURONTIN) 100 MG capsule Take 1 capsule (100 mg total) by mouth 3 (three) times daily. 270 capsule 2  . Melatonin 5 MG TABS Take by mouth.    . metoprolol tartrate (LOPRESSOR) 25 MG tablet TAKE 3 TABLETS (75 MG) BY MOUTH 2 (TWO) TIMES DAILY. PLEASE SCHEDULE APPT FOR REFILLS 180 tablet 6  . potassium chloride SA (KLOR-CON M20) 20 MEQ tablet Take 2 tablets (40 mEq total) by mouth 2 (two) times daily. 120 tablet 5  . tiZANidine (ZANAFLEX) 4 MG tablet TAKE 1 TABLET BY MOUTH AT SUPPER AND TAKE 1 TABLET BY MOUTH AT BEDTIME 180 tablet 5  .  sotalol (BETAPACE) 120 MG tablet Take 1 tablet (120 mg total) by mouth every 12 (twelve) hours. 60 tablet 11   No current facility-administered medications on file prior to visit.     BP 122/74   Pulse 68   Temp 98.2 F (36.8 C) (Oral)   Ht 5\' 2"  (1.575 m)   Wt 210 lb 4 oz (95.4 kg)   SpO2 98%   BMI 38.46 kg/m    Objective:   Physical Exam  Constitutional: She appears well-nourished. She does not appear ill.  HENT:  Right Ear: Tympanic membrane and ear canal normal.  Left Ear: Tympanic membrane and ear canal normal.  Nose: Mucosal edema present. Right sinus exhibits no maxillary sinus tenderness and no frontal sinus tenderness. Left sinus exhibits no maxillary sinus tenderness and no frontal sinus tenderness.  Mouth/Throat: Oropharynx is clear and moist.  Neck: Neck supple.  Cardiovascular: Normal rate and regular rhythm.  Respiratory: Effort normal and breath sounds normal. She has no wheezes.  Skin: Skin is warm and dry.           Assessment & Plan:  URI:  Cough, congestion x 6-7 days. Exam today with clear lungs, normal vital signs, does not appear sickly. Suspect viral involvement at this point, reassurance provided. Discussed conservative treatment. Rx for Ladona Ridgel sent to pharmacy. Trial of plain Claritin. Continue Flonase. Fluids, rest, return precautions provided.   Pleas Koch, NP

## 2018-07-30 ENCOUNTER — Encounter: Payer: Self-pay | Admitting: Internal Medicine

## 2018-07-30 ENCOUNTER — Ambulatory Visit (INDEPENDENT_AMBULATORY_CARE_PROVIDER_SITE_OTHER): Payer: PPO | Admitting: Internal Medicine

## 2018-07-30 VITALS — BP 126/90 | Resp 16 | Ht 62.0 in | Wt 211.0 lb

## 2018-07-30 DIAGNOSIS — G4733 Obstructive sleep apnea (adult) (pediatric): Secondary | ICD-10-CM | POA: Diagnosis not present

## 2018-07-30 DIAGNOSIS — Z23 Encounter for immunization: Secondary | ICD-10-CM

## 2018-07-30 DIAGNOSIS — G4719 Other hypersomnia: Secondary | ICD-10-CM

## 2018-07-30 NOTE — Patient Instructions (Signed)
Will send for sleep study.  Will give flu shot today.    Sleep Apnea    Sleep apnea is disorder that affects a person's sleep. A person with sleep apnea has abnormal pauses in their breathing when they sleep. It is hard for them to get a good sleep. This makes a person tired during the day. It also can lead to other physical problems. There are three types of sleep apnea. One type is when breathing stops for a short time because your airway is blocked (obstructive sleep apnea). Another type is when the brain sometimes fails to give the normal signal to breathe to the muscles that control your breathing (central sleep apnea). The third type is a combination of the other two types.  HOME CARE   Take all medicine as told by your doctor.  Avoid alcohol, calming medicines (sedatives), and depressant drugs.  Try to lose weight if you are overweight. Talk to your doctor about a healthy weight goal.  Your doctor may have you use a device that helps to open your airway. It can help you get the air that you need. It is called a positive airway pressure (PAP) device.   MAKE SURE YOU:   Understand these instructions.  Will watch your condition.  Will get help right away if you are not doing well or get worse.  It may take approximately 1 month for you to get used to wearing her CPAP every night.  Be sure to work with your machine to get used to it, be patient, it may take time!  If you have trouble tolerating CPAP DO NOT RETURN YOUR MACHINE; Contact our office to see if we can help you tolerate the CPAP better first!

## 2018-07-30 NOTE — Progress Notes (Signed)
[facility] Consultation      Assessment and Plan:  Obstructive sleep apnea with excessive daytime sleepiness.  --Her machine is malfunctioning, will need a new machine.  --Will need a new sleep study to re-qualify for CPAP.  --She did not tolerate an in-lab sleep study recently, will send for HST.  We will need to get a copy of her recent sleep study.   Date: 07/30/2018  MRN# [id number] Selena Bush 11-25-1945  Referring Physician: Gentry Fitz for sleep apnea.   Selena Bush is a 72 y.o. old female seen in consultation for chief complaint of:    Chief Complaint  Patient presents with  . Consult    Referred by [name]:     HPI:   The patient is a 72 year old female with complaints with symptoms of excessive daytime sleepiness.  She has a history of obstructive sleep apnea and has been on CPAP, her original sleep study was about 12 years ago, no further sleep study since that time.  She usually goes to bed between 1 and 3 AM, usually gets out of bed by 12 noon. She has a very old machine that it now malfunctioning and she would like to get a new one. She is starting to feel more sleepy during the day.  She has a history of CHF which is stable.   She tried doing an in-lab sleep study but could not sleep enough for the test.   **Sleep study 05/09/2017>> total sleep time 109 minutes with AHI of 3.   PMHX:   Past Medical History:  Diagnosis Date  . Anxiety   . Atrial fibrillation ([place])   . Chronic diastolic CHF (congestive heart failure) ([place])    a. echo 09/2014: EF 10-17%, diastolic dysfunction, mild LVH, nl RV size & systolic function, mildly dilated LA (4.3 cm), mild MR/TR, mildly elevated PASP 36.7 mm Hg  . DDD (degenerative disc disease), cervical   . DDD (degenerative disc disease), lumbar   . Depression   . Diffuse cystic mastopathy 2014  . Dyspnea   . GERD (gastroesophageal reflux disease)   . Headache    rare  . HLD (hyperlipidemia)      a. statin intolerant 2/2 myalgias  . Hypercholesterolemia   . Hypertension   . Malignant neoplasm of upper-outer quadrant of female breast ([place]) 10/2012   Papillary DCIS, sentinel node negative. DR/PR positive. PARTIAL RIGHT MASTECTOMY FOR BREAST CANCER--HAD RADIATION - NO CHEMO --DR. [place] ONCOLOGIST  . Obesity   . OSA on CPAP   . Osteoarthritis of both knees    a. s/p right TKA 04/2013 & left TKA 09/2014  . Otitis externa   . Personal history of radiation therapy 2015   RIGHT breast-mammosite per pt  . Sleep difficulties    LUNESTA HAS HELPED  . Vaginal cyst    Surgical Hx:  Past Surgical History:  Procedure Laterality Date  . ABDOMINAL HYSTERECTOMY  1992   DUB; fibroids; endometriosis.  One remaining ovary.    [name] BREAST LUMPECTOMY Right 2015   Papillary DCIS, sentinel node negative. DR/PR positive. PARTIAL RIGHT MASTECTOMY FOR BREAST CANCER--HAD RADIATION - NO CHEMO --DR. [place] ONCOLOGIST  . BREAST SURGERY Right March 2014   Wide excision,APB RT 10 mm papillary DCIS, ER/PR positive. Sentinel node negative. Partial breast radiation.  [name] CATARACT EXTRACTION, BILATERAL  02/13/2016   [place].  . CHOLECYSTECTOMY  1994  . COLONOSCOPY  2015   1 benign polyp-every 5 years/ Dr Candace Cruise  .  ERCP  1995  . JOINT REPLACEMENT Right Sept 2014   knee  . PERICARDIOCENTESIS N/A 11/13/2017   Procedure: PERICARDIOCENTESIS;  Surgeon: Nelva Bush, MD;  Location: [facility];  Service: Cardiovascular;  Laterality: N/A;  . TOTAL KNEE ARTHROPLASTY Right 04/16/2013   Procedure: RIGHT TOTAL KNEE ARTHROPLASTY;  Surgeon: Mauri Pole, MD;  Location: [facility];  Service: Orthopedics;  Laterality: Right;  . TOTAL KNEE ARTHROPLASTY Left 09/15/2014   Procedure: LEFT TOTAL KNEE ARTHROPLASTY;  Surgeon: Mauri Pole, MD;  Location: [facility];  Service: Orthopedics;  Laterality: Left;  . TUBAL LIGATION  1979   Family Hx:  Family History  Problem Relation Age of Onset  . Colon cancer  Mother   . Cancer Mother 29       colon cancer  . Melanoma Father   . Cancer Father 44       melanoma  . Colon cancer Maternal Grandfather   . Breast cancer Maternal Grandfather    Social Hx:   Social History   Tobacco Use  . Smoking status: Never Smoker  . Smokeless tobacco: Never Used  . Tobacco comment: social smoker as a teen  Substance Use Topics  . Alcohol use: No  . Drug use: No   Medication:    Current Outpatient Medications:  .  benzonatate (TESSALON) 200 MG capsule, Take 1 capsule (200 mg total) by mouth 3 (three) times daily as needed for cough., Disp: 15 capsule, Rfl: 0 .  colchicine 0.6 MG tablet, Take 1 tablet (0.6 mg total) by mouth 2 (two) times daily. (Patient taking differently: Take 0.6 mg by mouth 2 (two) times daily as needed (for gout). ), Disp: 30 tablet, Rfl: 0 .  diltiazem (CARDIZEM CD) 180 MG 24 hr capsule, Take 1 capsule (180 mg total) by mouth daily., Disp: 90 capsule, Rfl: 2 .  DULoxetine (CYMBALTA) 60 MG capsule, Take 1 capsule (60 mg total) by mouth 2 (two) times daily., Disp: 180 capsule, Rfl: 1 .  fluticasone (FLONASE) 50 MCG/ACT nasal spray, Place 2 sprays into both nostrils daily as needed for rhinitis or allergies., Disp: 17 g, Rfl: 11 .  furosemide (LASIX) 40 MG tablet, Take 40 mg by mouth daily., Disp: , Rfl: 11 .  gabapentin (NEURONTIN) 100 MG capsule, Take 1 capsule (100 mg total) by mouth 3 (three) times daily., Disp: 270 capsule, Rfl: 2 .  Melatonin 5 MG TABS, Take by mouth., Disp: , Rfl:  .  metoprolol tartrate (LOPRESSOR) 25 MG tablet, TAKE 3 TABLETS (75 MG) BY MOUTH 2 (TWO) TIMES DAILY. PLEASE SCHEDULE APPT FOR REFILLS, Disp: 180 tablet, Rfl: 6 .  potassium chloride SA (KLOR-CON M20) 20 MEQ tablet, Take 2 tablets (40 mEq total) by mouth 2 (two) times daily., Disp: 120 tablet, Rfl: 5 .  tiZANidine (ZANAFLEX) 4 MG tablet, TAKE 1 TABLET BY MOUTH AT SUPPER AND TAKE 1 TABLET BY MOUTH AT BEDTIME, Disp: 180 tablet, Rfl: 5 .  sotalol (BETAPACE)  120 MG tablet, Take 1 tablet (120 mg total) by mouth every 12 (twelve) hours., Disp: 60 tablet, Rfl: 11   Allergies:  Penicillins; Sulfa antibiotics; Codeine; Statins; and Aspirin  Review of Systems: Gen:  Denies  fever, sweats, chills HEENT: Denies blurred vision, double vision. bleeds, sore throat Cvc:  No dizziness, chest pain. Resp:   Denies cough or sputum production, shortness of breath Gi: Denies swallowing difficulty, stomach pain. Gu:  Denies bladder incontinence, burning urine Ext:   No Joint pain, stiffness. Skin: No  skin rash,  hives  Endoc:  No polyuria, polydipsia. Psych: No depression, insomnia. Other:  All other systems were reviewed with the patient and were negative other that what is mentioned in the HPI.   Physical Examination:   VS: BP 126/90   Resp 16   Ht 5\' 2"  (1.575 m)   Wt 211 lb (95.7 kg)   BMI 38.59 kg/m   General Appearance: No distress  Neuro:without focal findings,  speech normal,  HEENT: PERRLA, EOM intact.  malimpatti 3.  Pulmonary: normal breath sounds, No wheezing.  CardiovascularNormal S1,S2.  No m/r/g.   Abdomen: Benign, Soft, non-tender. Renal:  No costovertebral tenderness  GU:  No performed at this time. Endoc: No evident thyromegaly, no signs of acromegaly. Skin:   warm, no rashes, no ecchymosis  Extremities: normal, no cyanosis, clubbing.  Other findings:    LABORATORY PANEL:   CBC No results for input(s): WBC, HGB, HCT, PLT in the last 168 hours. ------------------------------------------------------------------------------------------------------------------  Chemistries  No results for input(s): NA, K, CL, CO2, GLUCOSE, BUN, CREATININE, CALCIUM, MG, AST, ALT, ALKPHOS, BILITOT in the last 168 hours.  Invalid input(s): GFRCGP ------------------------------------------------------------------------------------------------------------------  Cardiac Enzymes No results for input(s): TROPONINI in the last 168  hours. ------------------------------------------------------------  RADIOLOGY:  No results found.     Thank  you for the consultation and for allowing [facility] to assist in the care of your patient. Our recommendations are noted above.  Please contact us if we can be of further service.   Marda Stalker, M.D., F.C.C.P.  Board Certified in Internal Medicine, Pulmonary Medicine, [place], and Sleep Medicine.  [facility] Office Number: [phone]   07/30/2018

## 2018-08-12 ENCOUNTER — Other Ambulatory Visit: Payer: Self-pay

## 2018-08-12 ENCOUNTER — Encounter (HOSPITAL_COMMUNITY): Payer: Self-pay

## 2018-08-12 ENCOUNTER — Emergency Department (HOSPITAL_COMMUNITY): Payer: PPO

## 2018-08-12 ENCOUNTER — Emergency Department (HOSPITAL_COMMUNITY)
Admission: EM | Admit: 2018-08-12 | Discharge: 2018-08-12 | Disposition: A | Discharge status: Home / Self Care | Payer: PPO | Attending: Emergency Medicine | Admitting: Emergency Medicine

## 2018-08-12 DIAGNOSIS — Z79899 Other long term (current) drug therapy: Secondary | ICD-10-CM | POA: Diagnosis not present

## 2018-08-12 DIAGNOSIS — I5032 Chronic diastolic (congestive) heart failure: Secondary | ICD-10-CM | POA: Insufficient documentation

## 2018-08-12 DIAGNOSIS — Z853 Personal history of malignant neoplasm of breast: Secondary | ICD-10-CM | POA: Insufficient documentation

## 2018-08-12 DIAGNOSIS — I480 Paroxysmal atrial fibrillation: Secondary | ICD-10-CM | POA: Diagnosis not present

## 2018-08-12 DIAGNOSIS — J40 Bronchitis, not specified as acute or chronic: Secondary | ICD-10-CM | POA: Diagnosis not present

## 2018-08-12 DIAGNOSIS — R0602 Shortness of breath: Secondary | ICD-10-CM | POA: Diagnosis not present

## 2018-08-12 DIAGNOSIS — R05 Cough: Secondary | ICD-10-CM | POA: Diagnosis present

## 2018-08-12 DIAGNOSIS — E7800 Pure hypercholesterolemia, unspecified: Secondary | ICD-10-CM | POA: Insufficient documentation

## 2018-08-12 DIAGNOSIS — I110 Hypertensive heart disease with heart failure: Secondary | ICD-10-CM | POA: Insufficient documentation

## 2018-08-12 DIAGNOSIS — E785 Hyperlipidemia, unspecified: Secondary | ICD-10-CM | POA: Diagnosis not present

## 2018-08-12 LAB — BASIC METABOLIC PANEL
Anion gap: 10 (ref 5–15)
BUN: 15 mg/dL (ref 8–23)
CO2: 26 mmol/L (ref 22–32)
Calcium: 9.3 mg/dL (ref 8.9–10.3)
Chloride: 102 mmol/L (ref 98–111)
Creatinine, Ser: 1.19 mg/dL — ABNORMAL HIGH (ref 0.44–1.00)
GFR calc Af Amer: 54 mL/min — ABNORMAL LOW (ref >60)
GFR, EST NON AFRICAN AMERICAN: 46 mL/min — AB (ref >60)
Glucose, Bld: 102 mg/dL — ABNORMAL HIGH (ref 70–99)
Potassium: 4.3 mmol/L (ref 3.5–5.1)
Sodium: 138 mmol/L (ref 135–145)

## 2018-08-12 LAB — CBC WITH DIFFERENTIAL/PLATELET
Abs Immature Granulocytes: 0.01 10*3/uL (ref 0.00–0.07)
Basophils Absolute: 0.1 10*3/uL (ref 0.0–0.1)
Basophils Relative: 1 %
Eosinophils Absolute: 0 10*3/uL (ref 0.0–0.5)
Eosinophils Relative: 1 %
HCT: 37.8 % (ref 36.0–46.0)
Hemoglobin: 11.6 g/dL — ABNORMAL LOW (ref 12.0–15.0)
IMMATURE GRANULOCYTES: 0 %
Lymphocytes Relative: 20 %
Lymphs Abs: 1.4 10*3/uL (ref 0.7–4.0)
MCH: 26.4 pg (ref 26.0–34.0)
MCHC: 30.7 g/dL (ref 30.0–36.0)
MCV: 86.1 fL (ref 80.0–100.0)
Monocytes Absolute: 0.8 10*3/uL (ref 0.1–1.0)
Monocytes Relative: 11 %
NEUTROS PCT: 67 %
NRBC: 0 % (ref 0.0–0.2)
Neutro Abs: 4.8 10*3/uL (ref 1.7–7.7)
Platelets: 236 10*3/uL (ref 150–400)
RBC: 4.39 MIL/uL (ref 3.87–5.11)
RDW: 15.8 % — ABNORMAL HIGH (ref 11.5–15.5)
WBC: 7.1 10*3/uL (ref 4.0–10.5)

## 2018-08-12 LAB — I-STAT TROPONIN, ED: Troponin i, poc: 0 ng/mL (ref 0.00–0.08)

## 2018-08-12 LAB — BRAIN NATRIURETIC PEPTIDE: B Natriuretic Peptide: 67.4 pg/mL (ref 0.0–100.0)

## 2018-08-12 MED ORDER — ALBUTEROL SULFATE (2.5 MG/3ML) 0.083% IN NEBU
5.0000 mg | INHALATION_SOLUTION | Freq: Once | RESPIRATORY_TRACT | Status: AC
  Administered 2018-08-12: 5 mg via RESPIRATORY_TRACT
  Filled 2018-08-12: qty 6

## 2018-08-12 MED ORDER — ALBUTEROL SULFATE HFA 108 (90 BASE) MCG/ACT IN AERS: 2.0000 | INHALATION_SPRAY | RESPIRATORY_TRACT | 0 refills | Status: DC | PRN

## 2018-08-12 MED ORDER — PREDNISONE 20 MG PO TABS
60.0000 mg | ORAL_TABLET | Freq: Once | ORAL | Status: AC
  Administered 2018-08-12: 60 mg via ORAL
  Filled 2018-08-12: qty 3

## 2018-08-12 MED ORDER — SODIUM CHLORIDE 0.9 % IV SOLN
INTRAVENOUS | Status: DC
  Administered 2018-08-12: 10:00:00 via INTRAVENOUS

## 2018-08-12 MED ORDER — PREDNISONE 50 MG PO TABS: ORAL_TABLET | ORAL | 0 refills | Status: DC

## 2018-08-12 MED ORDER — IPRATROPIUM BROMIDE 0.02 % IN SOLN
0.5000 mg | Freq: Once | RESPIRATORY_TRACT | Status: AC
  Administered 2018-08-12: 0.5 mg via RESPIRATORY_TRACT
  Filled 2018-08-12: qty 2.5

## 2018-08-12 NOTE — ED Notes (Signed)
Pt verbalized understanding of d/c instructions and has no further questions, VSS, NAD. Pt removed all belongings from bed.

## 2018-08-12 NOTE — ED Provider Notes (Signed)
[facility] EMERGENCY DEPARTMENT Provider Note   CSN: [id number] Arrival date & time: 08/12/18  4503     History   Chief Complaint No chief complaint on file.   HPI Selena Bush is a 72 y.o. female.  72 year old female presents with cough or shortness of breath x4 days.  Has noted URI symptoms consisting of nasal congestion with rhinorrhea.  No fever or chills.  Cough is been nonproductive.  No substernal chest pain or chest heaviness.  No pedal edema.  Denies any orthopnea but does note dyspnea on exertion.  Has been medicating with over-the-counter medications without relief.  Does have a history of CHF in the past.     Past Medical History:  Diagnosis Date  . Anxiety   . Atrial fibrillation ([place])   . Chronic diastolic CHF (congestive heart failure) ([place])    a. echo 09/2014: EF 88-82%, diastolic dysfunction, mild LVH, nl RV size & systolic function, mildly dilated LA (4.3 cm), mild MR/TR, mildly elevated PASP 36.7 mm Hg  . DDD (degenerative disc disease), cervical   . DDD (degenerative disc disease), lumbar   . Depression   . Diffuse cystic mastopathy 2014  . Dyspnea   . GERD (gastroesophageal reflux disease)   . Headache    rare  . HLD (hyperlipidemia)    a. statin intolerant 2/2 myalgias  . Hypercholesterolemia   . Hypertension   . Malignant neoplasm of upper-outer quadrant of female breast ([place]) 10/2012   Papillary DCIS, sentinel node negative. DR/PR positive. PARTIAL RIGHT MASTECTOMY FOR BREAST CANCER--HAD RADIATION - NO CHEMO --DR. [place] ONCOLOGIST  . Obesity   . OSA on CPAP   . Osteoarthritis of both knees    a. s/p right TKA 04/2013 & left TKA 09/2014  . Otitis externa   . Personal history of radiation therapy 2015   RIGHT breast-mammosite per pt  . Sleep difficulties    LUNESTA HAS HELPED  . Vaginal cyst     Patient Active Problem List   Diagnosis Date Noted  . (HFpEF) heart failure with preserved ejection fraction ([place])  12/12/2017  . Pericardial effusion with cardiac tamponade 11/13/2017  . Paroxysmal atrial fibrillation ([place]) 11/05/2017  . Anxiety 03/21/2016  . Insomnia 07/17/2015  . MI (mitral incompetence) 12/10/2014  . Major depressive disorder 12/09/2014  . OSA (obstructive sleep apnea) 12/09/2014  . GERD (gastroesophageal reflux disease) 12/09/2014  . Hyperglycemia 12/09/2014  . Chronic diastolic CHF (congestive heart failure) ([place])   . HLD (hyperlipidemia)   . Osteoarthritis of both knees   . S/P left TKA 08/19/2014  . 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 (BMI) 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 ([place]) 04/17/2013  . DCIS (ductal carcinoma in situ) 11/05/2012  . Essential hypertension, benign 11/05/2012    Past Surgical History:  Procedure Laterality Date  . ABDOMINAL HYSTERECTOMY  1992   DUB; fibroids; endometriosis.  One remaining ovary.    [name] BREAST LUMPECTOMY Right 2015   Papillary DCIS, sentinel node negative. DR/PR positive. PARTIAL RIGHT MASTECTOMY FOR BREAST CANCER--HAD RADIATION - NO CHEMO --DR. [place] ONCOLOGIST  . BREAST SURGERY Right March 2014   Wide excision,APB RT 10 mm papillary DCIS, ER/PR positive. Sentinel node negative. Partial breast radiation.  [name] CATARACT EXTRACTION, BILATERAL  02/13/2016   [place].  . CHOLECYSTECTOMY  1994  . COLONOSCOPY  2015   1 benign polyp-every 5 years/ Dr Candace Cruise  . ERCP  1995  . JOINT REPLACEMENT Right Sept 2014   knee  .  PERICARDIOCENTESIS N/A 11/13/2017   Procedure: PERICARDIOCENTESIS;  Surgeon: Nelva Bush, MD;  Location: [facility];  Service: Cardiovascular;  Laterality: N/A;  . TOTAL KNEE ARTHROPLASTY Right 04/16/2013   Procedure: RIGHT TOTAL KNEE ARTHROPLASTY;  Surgeon: Mauri Pole, MD;  Location: [facility];  Service: Orthopedics;  Laterality: Right;  . TOTAL KNEE ARTHROPLASTY Left 09/15/2014   Procedure: LEFT TOTAL KNEE ARTHROPLASTY;  Surgeon: Mauri Pole, MD;  Location: [facility];  Service: Orthopedics;  Laterality:  Left;  . TUBAL LIGATION  1979     OB History    Gravida  2   Para  2   Term      Preterm      AB      Living  2     SAB      TAB      Ectopic      Multiple      Live Births           Obstetric Comments  First pregnancy 22 First menstrual 15         Home Medications    Prior to Admission medications   Medication Sig Start Date End Date Taking? Authorizing Provider  benzonatate (TESSALON) 200 MG capsule Take 1 capsule (200 mg total) by mouth 3 (three) times daily as needed for cough. 07/24/18   Pleas Koch, NP  colchicine 0.6 MG tablet Take 1 tablet (0.6 mg total) by mouth 2 (two) times daily. Patient taking differently: Take 0.6 mg by mouth 2 (two) times daily as needed (for gout).  10/11/17   Wardell Honour, MD  diltiazem (CARDIZEM CD) 180 MG 24 hr capsule Take 1 capsule (180 mg total) by mouth daily. 05/21/18   Lelon Perla, MD  DULoxetine (CYMBALTA) 60 MG capsule Take 1 capsule (60 mg total) by mouth 2 (two) times daily. 03/28/18   Pleas Koch, NP  fluticasone (FLONASE) 50 MCG/ACT nasal spray Place 2 sprays into both nostrils daily as needed for rhinitis or allergies. 10/11/17   Wardell Honour, MD  furosemide (LASIX) 40 MG tablet Take 40 mg by mouth daily. 03/15/18   [provider]  gabapentin (NEURONTIN) 100 MG capsule Take 1 capsule (100 mg total) by mouth 3 (three) times daily. 06/15/18   Pleas Koch, NP  Melatonin 5 MG TABS Take by mouth.    [provider]  metoprolol tartrate (LOPRESSOR) 25 MG tablet TAKE 3 TABLETS (75 MG) BY MOUTH 2 (TWO) TIMES DAILY. PLEASE SCHEDULE APPT FOR REFILLS 02/26/18   Lelon Perla, MD  potassium chloride SA (KLOR-CON M20) 20 MEQ tablet Take 2 tablets (40 mEq total) by mouth 2 (two) times daily. 04/23/18   Lelon Perla, MD  sotalol (BETAPACE) 120 MG tablet Take 1 tablet (120 mg total) by mouth every 12 (twelve) hours. 02/09/18 03/21/18  Lelon Perla, MD  tiZANidine (ZANAFLEX) 4 MG  tablet TAKE 1 TABLET BY MOUTH AT SUPPER AND TAKE 1 TABLET BY MOUTH AT BEDTIME 10/11/17   Wardell Honour, MD    Family History Family History  Problem Relation Age of Onset  . Colon cancer Mother   . Cancer Mother 14       colon cancer  . Melanoma Father   . Cancer Father 68       melanoma  . Colon cancer Maternal Grandfather   . Breast cancer Maternal Grandfather     Social History Social History   Tobacco Use  .  Smoking status: Never Smoker  . Smokeless tobacco: Never Used  . Tobacco comment: social smoker as a teen  Substance Use Topics  . Alcohol use: No  . Drug use: No     Allergies   Penicillins; Sulfa antibiotics; Codeine; Statins; and Aspirin   Review of Systems Review of Systems  All other systems reviewed and are negative.    Physical Exam Updated Vital Signs There were no vitals taken for this visit.  Physical Exam Vitals signs and nursing note reviewed.  Constitutional:      General: She is not in acute distress.    Appearance: Normal appearance. She is well-developed. She is not toxic-appearing.  HENT:     Head: Normocephalic and atraumatic.  Eyes:     General: Lids are normal.     Conjunctiva/sclera: Conjunctivae normal.     Pupils: Pupils are equal, round, and reactive to light.  Neck:     Musculoskeletal: Normal range of motion and neck supple.     Thyroid: No thyroid mass.     Trachea: No tracheal deviation.  Cardiovascular:     Rate and Rhythm: Normal rate and regular rhythm.     Heart sounds: Normal heart sounds. No murmur. No gallop.   Pulmonary:     Effort: Pulmonary effort is normal. No respiratory distress.     Breath sounds: No stridor. Examination of the right-upper field reveals decreased breath sounds and wheezing. Examination of the left-upper field reveals decreased breath sounds and wheezing. Decreased breath sounds and wheezing present. No rhonchi or rales.  Abdominal:     General: Bowel sounds are normal. There is no  distension.     Palpations: Abdomen is soft.     Tenderness: There is no abdominal tenderness. There is no rebound.  Musculoskeletal: Normal range of motion.        General: No tenderness.  Skin:    General: Skin is warm and dry.     Findings: No abrasion or rash.  Neurological:     Mental Status: She is alert and oriented to person, place, and time.     GCS: GCS eye subscore is 4. GCS verbal subscore is 5. GCS motor subscore is 6.     Cranial Nerves: No cranial nerve deficit.     Sensory: No sensory deficit.  Psychiatric:        Speech: Speech normal.        Behavior: Behavior normal.      ED Treatments / Results  Labs (all labs ordered are listed, but only abnormal results are displayed) Labs Reviewed  BRAIN NATRIURETIC PEPTIDE  CBC WITH DIFFERENTIAL/PLATELET  BASIC METABOLIC PANEL  I-STAT TROPONIN, ED    EKG None  Radiology No results found.  Procedures Procedures (including critical care time)  Medications Ordered in ED Medications  0.9 %  sodium chloride infusion (has no administration in time range)  albuterol (PROVENTIL) (2.5 MG/3ML) 0.083% nebulizer solution 5 mg (has no administration in time range)  ipratropium (ATROVENT) nebulizer solution 0.5 mg (has no administration in time range)     Initial Impression / Assessment and Plan / ED Course  I have reviewed the triage vital signs and the nursing notes.  Pertinent labs & imaging results that were available during my care of the patient were reviewed by me and considered in my medical decision making (see chart for details).     Patient wheezes have improved after albuterol.  Also given prednisone here.  No evidence of CHF.[name]  Suspect bronchitis and will discharge home  Final Clinical Impressions(s) / ED Diagnoses   Final diagnoses:  SOB (shortness of breath)    ED Discharge Orders    None       Lacretia Leigh, MD 08/12/18 1238

## 2018-08-12 NOTE — ED Notes (Signed)
Pt taken to x-ray. Feels much better after breathing treatment. Wheezing decreased.

## 2018-08-12 NOTE — ED Triage Notes (Signed)
Pt endorses productive cough with green sputum and shob x 4 days. Denies fever or chills. Has hx of CHF and is on fluid pills. Denies CP. VSS.

## 2018-08-13 ENCOUNTER — Telehealth: Payer: Self-pay

## 2018-08-13 NOTE — Telephone Encounter (Signed)
Pt was seen [place] on 08/12/18 dx with bronchitis and SOB. Unable to reach pt to update condition.

## 2018-08-13 NOTE — Telephone Encounter (Signed)
Message left for patient to return my call.  

## 2018-08-13 NOTE — Telephone Encounter (Signed)
Pt returning your call

## 2018-08-13 NOTE — Telephone Encounter (Signed)
Spoken to patient. She stated that she is kind of better. The Duo Neb helped a lot. Patient stated that she keep [place] update if she feels worse again

## 2018-08-16 ENCOUNTER — Other Ambulatory Visit: Payer: Self-pay

## 2018-08-16 DIAGNOSIS — D0511 Intraductal carcinoma in situ of right breast: Secondary | ICD-10-CM

## 2018-08-21 DIAGNOSIS — F331 Major depressive disorder, recurrent, moderate: Secondary | ICD-10-CM | POA: Diagnosis not present

## 2018-08-21 DIAGNOSIS — F4312 Post-traumatic stress disorder, chronic: Secondary | ICD-10-CM | POA: Diagnosis not present

## 2018-08-23 DIAGNOSIS — G4733 Obstructive sleep apnea (adult) (pediatric): Secondary | ICD-10-CM | POA: Diagnosis not present

## 2018-08-28 DIAGNOSIS — G4733 Obstructive sleep apnea (adult) (pediatric): Secondary | ICD-10-CM | POA: Diagnosis not present

## 2018-08-29 ENCOUNTER — Telehealth: Payer: Self-pay

## 2018-08-29 DIAGNOSIS — G4719 Other hypersomnia: Secondary | ICD-10-CM

## 2018-08-29 DIAGNOSIS — G4733 Obstructive sleep apnea (adult) (pediatric): Secondary | ICD-10-CM

## 2018-08-29 NOTE — Telephone Encounter (Signed)
Spoke to patient regarding sleep study results.   Moderate OSA with AHI of 13.  Recommend auto-CPAP with pressure range 5-12 cm H2O.  Patient is already with [facility] and wishes to continue with them with new machine. Orders placed.

## 2018-08-31 ENCOUNTER — Other Ambulatory Visit: Payer: Self-pay

## 2018-08-31 ENCOUNTER — Emergency Department: Payer: PPO

## 2018-08-31 DIAGNOSIS — Y999 Unspecified external cause status: Secondary | ICD-10-CM | POA: Insufficient documentation

## 2018-08-31 DIAGNOSIS — Z79899 Other long term (current) drug therapy: Secondary | ICD-10-CM | POA: Insufficient documentation

## 2018-08-31 DIAGNOSIS — S0091XA Abrasion of unspecified part of head, initial encounter: Secondary | ICD-10-CM | POA: Insufficient documentation

## 2018-08-31 DIAGNOSIS — Z96653 Presence of artificial knee joint, bilateral: Secondary | ICD-10-CM | POA: Insufficient documentation

## 2018-08-31 DIAGNOSIS — W010XXA Fall on same level from slipping, tripping and stumbling without subsequent striking against object, initial encounter: Secondary | ICD-10-CM | POA: Insufficient documentation

## 2018-08-31 DIAGNOSIS — I5032 Chronic diastolic (congestive) heart failure: Secondary | ICD-10-CM | POA: Diagnosis not present

## 2018-08-31 DIAGNOSIS — Y9301 Activity, walking, marching and hiking: Secondary | ICD-10-CM | POA: Diagnosis not present

## 2018-08-31 DIAGNOSIS — Z23 Encounter for immunization: Secondary | ICD-10-CM | POA: Diagnosis not present

## 2018-08-31 DIAGNOSIS — Y929 Unspecified place or not applicable: Secondary | ICD-10-CM | POA: Insufficient documentation

## 2018-08-31 DIAGNOSIS — I110 Hypertensive heart disease with heart failure: Secondary | ICD-10-CM | POA: Insufficient documentation

## 2018-08-31 DIAGNOSIS — S0990XA Unspecified injury of head, initial encounter: Secondary | ICD-10-CM | POA: Diagnosis not present

## 2018-08-31 NOTE — ED Triage Notes (Signed)
Patient reports pushing her trash can up drive way and slipped and fell.  Patient with abrasion noted to left forehead.  Patient denies loss of consciousness or taking blood thinners.

## 2018-09-01 ENCOUNTER — Emergency Department
Admission: EM | Admit: 2018-09-01 | Discharge: 2018-09-01 | Disposition: A | Discharge status: Home / Self Care | Payer: PPO | Attending: Emergency Medicine | Admitting: Emergency Medicine

## 2018-09-01 DIAGNOSIS — S0990XA Unspecified injury of head, initial encounter: Secondary | ICD-10-CM

## 2018-09-01 DIAGNOSIS — T148XXA Other injury of unspecified body region, initial encounter: Secondary | ICD-10-CM

## 2018-09-01 DIAGNOSIS — W19XXXA Unspecified fall, initial encounter: Secondary | ICD-10-CM

## 2018-09-01 MED ORDER — TETANUS-DIPHTHERIA TOXOIDS TD 5-2 LFU IM INJ
0.5000 mL | INJECTION | Freq: Once | INTRAMUSCULAR | Status: AC
  Administered 2018-09-01: 0.5 mL via INTRAMUSCULAR
  Filled 2018-09-01: qty 0.5

## 2018-09-01 MED ORDER — ACETAMINOPHEN 325 MG PO TABS
650.0000 mg | ORAL_TABLET | Freq: Once | ORAL | Status: AC
  Administered 2018-09-01: 650 mg via ORAL
  Filled 2018-09-01: qty 2

## 2018-09-01 NOTE — Discharge Instructions (Addendum)
1.  You may take Tylenol as needed for pain. 2.  Your tetanus has been updated and will be good for 10 years. 3.  Return to the ER for worsening symptoms, persistent vomiting, lethargy, difficulty breathing or other concerns.

## 2018-09-01 NOTE — ED Notes (Signed)
Pt has large abrasion to L forehead. Bleeding controlled at this time. Pt denies anticoagulation.

## 2018-09-01 NOTE — ED Provider Notes (Signed)
[facility] Emergency Department Provider Note   ____________________________________________   First MD Initiated Contact with Patient 09/01/18 0258     (approximate)  I have reviewed the triage vital signs and the nursing notes.   HISTORY  Chief Complaint Fall and Head Injury    HPI Selena Bush is a 73 y.o. female who presents to the ED from home status post fall with minor head injury.  Patient was pushing her trash can up the driveway, slipped and fell, striking her left forehead.  Denies LOC.  Denies use of anticoagulants.  Unknown last tetanus.  Other than mild frontal headache, denies vision changes, neck pain, chest pain, shortness of breath, abdominal pain, nausea, vomiting or dizziness.   Past Medical History:  Diagnosis Date  . Anxiety   . Atrial fibrillation ([place])   . Chronic diastolic CHF (congestive heart failure) ([place])    a. echo 09/2014: EF 03-50%, diastolic dysfunction, mild LVH, nl RV size & systolic function, mildly dilated LA (4.3 cm), mild MR/TR, mildly elevated PASP 36.7 mm Hg  . DDD (degenerative disc disease), cervical   . DDD (degenerative disc disease), lumbar   . Depression   . Diffuse cystic mastopathy 2014  . Dyspnea   . GERD (gastroesophageal reflux disease)   . Headache    rare  . HLD (hyperlipidemia)    a. statin intolerant 2/2 myalgias  . Hypercholesterolemia   . Hypertension   . Malignant neoplasm of upper-outer quadrant of female breast ([place]) 10/2012   Papillary DCIS, sentinel node negative. DR/PR positive. PARTIAL RIGHT MASTECTOMY FOR BREAST CANCER--HAD RADIATION - NO CHEMO --DR. Kerr ONCOLOGIST  . Obesity   . OSA on CPAP   . Osteoarthritis of both knees    a. s/p right TKA 04/2013 & left TKA 09/2014  . Otitis externa   . Personal history of radiation therapy 2015   RIGHT breast-mammosite per pt  . Sleep difficulties    LUNESTA HAS HELPED  . Vaginal cyst     Patient Active Problem List   Diagnosis Date Noted  . (HFpEF) heart failure with preserved ejection fraction ([place]) 12/12/2017  . Pericardial effusion with cardiac tamponade 11/13/2017  . Paroxysmal atrial fibrillation ([place]) 11/05/2017  . Anxiety 03/21/2016  . Insomnia 07/17/2015  . MI (mitral incompetence) 12/10/2014  . Major depressive disorder 12/09/2014  . OSA (obstructive sleep apnea) 12/09/2014  . GERD (gastroesophageal reflux disease) 12/09/2014  . Hyperglycemia 12/09/2014  . Chronic diastolic CHF (congestive heart failure) ([place])   . HLD (hyperlipidemia)   . Osteoarthritis of both knees   . S/P left TKA 08/19/2014  . 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 (BMI) 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 ([place]) 04/17/2013  . DCIS (ductal carcinoma in situ) 11/05/2012  . Essential hypertension, benign 11/05/2012    Past Surgical History:  Procedure Laterality Date  . ABDOMINAL HYSTERECTOMY  1992   DUB; fibroids; endometriosis.  One remaining ovary.    [name] BREAST LUMPECTOMY Right 2015   Papillary DCIS, sentinel node negative. DR/PR positive. PARTIAL RIGHT MASTECTOMY FOR BREAST CANCER--HAD RADIATION - NO CHEMO --DR. Vardaman ONCOLOGIST  . BREAST SURGERY Right March 2014   Wide excision,APB RT 10 mm papillary DCIS, ER/PR positive. Sentinel node negative. Partial breast radiation.  [name] CATARACT EXTRACTION, BILATERAL  02/13/2016   [place].  . CHOLECYSTECTOMY  1994  . COLONOSCOPY  2015   1 benign polyp-every 5 years/ Dr Candace Cruise  . ERCP  1995  . JOINT REPLACEMENT Right Sept  2014   knee  . PERICARDIOCENTESIS N/A 11/13/2017   Procedure: PERICARDIOCENTESIS;  Surgeon: Nelva Bush, MD;  Location: [facility];  Service: Cardiovascular;  Laterality: N/A;  . TOTAL KNEE ARTHROPLASTY Right 04/16/2013   Procedure: RIGHT TOTAL KNEE ARTHROPLASTY;  Surgeon: Mauri Pole, MD;  Location: [facility];  Service: Orthopedics;  Laterality: Right;  . TOTAL KNEE ARTHROPLASTY Left 09/15/2014   Procedure: LEFT TOTAL KNEE ARTHROPLASTY;   Surgeon: Mauri Pole, MD;  Location: [facility];  Service: Orthopedics;  Laterality: Left;  . TUBAL LIGATION  1979    Prior to Admission medications   Medication Sig Start Date End Date Taking? Authorizing Provider  albuterol (PROVENTIL HFA;VENTOLIN HFA) 108 (90 Base) MCG/ACT inhaler Inhale 2 puffs into the lungs every 4 (four) hours as needed for wheezing or shortness of breath. 08/12/18   Lacretia Leigh, MD  benzonatate (TESSALON) 200 MG capsule Take 1 capsule (200 mg total) by mouth 3 (three) times daily as needed for cough. 07/24/18   Pleas Koch, NP  colchicine 0.6 MG tablet Take 1 tablet (0.6 mg total) by mouth 2 (two) times daily. Patient taking differently: Take 0.6 mg by mouth 2 (two) times daily as needed (for gout).  10/11/17   Wardell Honour, MD  diltiazem (CARDIZEM CD) 180 MG 24 hr capsule Take 1 capsule (180 mg total) by mouth daily. 05/21/18   Lelon Perla, MD  DULoxetine (CYMBALTA) 60 MG capsule Take 1 capsule (60 mg total) by mouth 2 (two) times daily. 03/28/18   Pleas Koch, NP  fluticasone (FLONASE) 50 MCG/ACT nasal spray Place 2 sprays into both nostrils daily as needed for rhinitis or allergies. 10/11/17   Wardell Honour, MD  furosemide (LASIX) 40 MG tablet Take 40 mg by mouth daily. 03/15/18   [provider]  gabapentin (NEURONTIN) 100 MG capsule Take 1 capsule (100 mg total) by mouth 3 (three) times daily. 06/15/18   Pleas Koch, NP  Melatonin 5 MG TABS Take by mouth.    [provider]  metoprolol tartrate (LOPRESSOR) 25 MG tablet TAKE 3 TABLETS (75 MG) BY MOUTH 2 (TWO) TIMES DAILY. PLEASE SCHEDULE APPT FOR REFILLS 02/26/18   Lelon Perla, MD  potassium chloride SA (KLOR-CON M20) 20 MEQ tablet Take 2 tablets (40 mEq total) by mouth 2 (two) times daily. 04/23/18   Lelon Perla, MD  predniSONE (DELTASONE) 50 MG tablet 1 p.o. daily x5 08/12/18   Lacretia Leigh, MD  sotalol (BETAPACE) 120 MG tablet Take 1 tablet (120 mg total) by  mouth every 12 (twelve) hours. 02/09/18 03/21/18  Lelon Perla, MD  tiZANidine (ZANAFLEX) 4 MG tablet TAKE 1 TABLET BY MOUTH AT SUPPER AND TAKE 1 TABLET BY MOUTH AT BEDTIME 10/11/17   Wardell Honour, MD    Allergies Penicillins; Sulfa antibiotics; Codeine; Statins; and Aspirin  Family History  Problem Relation Age of Onset  . Colon cancer Mother   . Cancer Mother 33       colon cancer  . Melanoma Father   . Cancer Father 51       melanoma  . Colon cancer Maternal Grandfather   . Breast cancer Maternal Grandfather     Social History Social History   Tobacco Use  . Smoking status: Never Smoker  . Smokeless tobacco: Never Used  . Tobacco comment: social smoker as a teen  Substance Use Topics  . Alcohol use: No  . Drug use: No  Review of Systems  Constitutional: No fever/chills Eyes: No visual changes. ENT: No sore throat. Cardiovascular: Denies chest pain. Respiratory: Denies shortness of breath. Gastrointestinal: No abdominal pain.  No nausea, no vomiting.  No diarrhea.  No constipation. Genitourinary: Negative for dysuria. Musculoskeletal: Negative for back pain. Skin: Negative for rash. Neurological: Positive for minor head injury.  Negative for headaches, focal weakness or numbness.   ____________________________________________   PHYSICAL EXAM:  VITAL SIGNS: ED Triage Vitals [08/31/18 2252]  Enc Vitals Group     BP 123/73     Pulse Rate 66     Resp 18     Temp 98.2 F (36.8 C)     Temp Source Oral     SpO2 97 %     Weight      Height      Head Circumference      Peak Flow      Pain Score 0     Pain Loc      Pain Edu?      Excl. in [place]?     Constitutional: Alert and oriented. Well appearing and in no acute distress. Eyes: Conjunctivae are normal. PERRL. EOMI. Head: Left forehead abrasion without active bleeding. Nose: Atraumatic. Mouth/Throat: Mucous membranes are moist.  No dental malocclusion. Neck: No stridor.  No cervical spine  tenderness to palpation. Cardiovascular: Normal rate, regular rhythm. Grossly normal heart sounds.  Good peripheral circulation. Respiratory: Normal respiratory effort.  No retractions. Lungs CTAB. Gastrointestinal: Soft and nontender. No distention. No abdominal bruits. No CVA tenderness. Musculoskeletal: No lower extremity tenderness nor edema.  No joint effusions. Neurologic: Alert and oriented x3.  CN II-XII grossly intact.  Normal speech and language. No gross focal neurologic deficits are appreciated. No gait instability. Skin:  Skin is warm, dry and intact. No rash noted. Psychiatric: Mood and affect are normal. Speech and behavior are normal.  ____________________________________________   LABS (all labs ordered are listed, but only abnormal results are displayed)  Labs Reviewed - No data to display ____________________________________________  EKG  None ____________________________________________  RADIOLOGY  ED MD interpretation:  No ICH  Official radiology report(s): Ct Head Wo Contrast  Result Date: 09/01/2018 CLINICAL DATA:  Fall EXAM: CT HEAD WITHOUT CONTRAST TECHNIQUE: Contiguous axial images were obtained from the base of the skull through the vertex without intravenous contrast. COMPARISON:  None. FINDINGS: Brain: There is no mass, hemorrhage or extra-axial collection. The size and configuration of the ventricles and extra-axial CSF spaces are normal. There is hypoattenuation of the white matter, most commonly indicating chronic small vessel disease. Vascular: No abnormal hyperdensity of the major intracranial arteries or dural venous sinuses. No intracranial atherosclerosis. Skull: The visualized skull base, calvarium and extracranial soft tissues are normal. Sinuses/Orbits: Near complete opacification of the right maxillary sinus. Moderate bilateral ethmoid and frontal sinus opacification. The orbits are normal. IMPRESSION: Chronic small vessel disease without acute  intracranial abnormality. Electronically Signed   By: Ulyses Jarred M.D.   On: 09/01/2018 00:31    ____________________________________________   PROCEDURES  Procedure(s) performed: None  Procedures  Critical Care performed: No  ____________________________________________   INITIAL IMPRESSION / ASSESSMENT AND PLAN / ED COURSE  As part of my medical decision making, I reviewed the following data within the [facility] notes reviewed and incorporated, Radiograph reviewed and Notes from prior ED visits   73 year old female who presents with left forehead abrasion status post minor head injury.  Will update tetanus, administer Tylenol for mild frontal  headache.  Strict return precautions given.  Patient verbalizes understanding agrees with plan of care.      ____________________________________________   FINAL CLINICAL IMPRESSION(S) / ED DIAGNOSES  Final diagnoses:  Fall, initial encounter  Minor head injury, initial encounter  Abrasion     ED Discharge Orders    None       Note:  This document was prepared using Dragon voice recognition software and may include unintentional dictation errors.    Paulette Blanch, MD 09/01/18 [id number]

## 2018-09-05 ENCOUNTER — Ambulatory Visit (INDEPENDENT_AMBULATORY_CARE_PROVIDER_SITE_OTHER): Payer: PPO | Admitting: Primary Care

## 2018-09-05 ENCOUNTER — Ambulatory Visit: Payer: PPO | Admitting: Primary Care

## 2018-09-05 ENCOUNTER — Encounter: Payer: Self-pay | Admitting: Primary Care

## 2018-09-05 VITALS — BP 122/74 | HR 64 | Temp 98.2°F | Ht 62.0 in | Wt 209.5 lb

## 2018-09-05 DIAGNOSIS — S0081XA Abrasion of other part of head, initial encounter: Secondary | ICD-10-CM

## 2018-09-05 DIAGNOSIS — W19XXXD Unspecified fall, subsequent encounter: Secondary | ICD-10-CM

## 2018-09-05 NOTE — Progress Notes (Signed)
Subjective:    Patient ID: Selena Bush, female    DOB: [birth year], 73 y.o.   MRN: [id number]  HPI  Selena Bush is a 73 year female who presents today for emergency department follow up.  She presented to [facility] ED on September 01, 2018 with a chief complaint of fall and head injury.  She was pushing her trash can up the driveway when she slipped and fell striking her left forehead onto the ground.  She denied loss of consciousness, use of anticoagulants, dizziness, nausea/vomiting.  During her stay in the emergency department she was provided with a tetanus vaccination.  She also underwent CT head which was negative for acute changes.  Other than an abrasion to her left forehead her examination was grossly unremarkable.  She was released home later that morning.  Since her ED visit she's feeling well. She denies dizziness, headaches. She's been taking Tylenol for soreness to the abrasion of her left frontal lobe. She's also noticed some soreness to the forehead when wearing her CPAP machine mask at night. She's not removed the bandaid that was placed during her visit on 09/01/18.  BP Readings from Last 3 Encounters:  09/05/18 122/74  09/01/18 (!) 175/79  08/12/18 (!) 129/52     Review of Systems  Constitutional: Negative for fever.  Eyes: Negative for visual disturbance.  Skin: Positive for wound. Negative for color change.  Neurological: Negative for dizziness and headaches.       Past Medical History:  Diagnosis Date  . Anxiety   . Atrial fibrillation ([place])   . Chronic diastolic CHF (congestive heart failure) ([place])    a. echo 09/2014: EF 95-62%, diastolic dysfunction, mild LVH, nl RV size & systolic function, mildly dilated LA (4.3 cm), mild MR/TR, mildly elevated PASP 36.7 mm Hg  . DDD (degenerative disc disease), cervical   . DDD (degenerative disc disease), lumbar   . Depression   . Diffuse cystic mastopathy 2014  . Dyspnea   . GERD (gastroesophageal reflux disease)   .  Headache    rare  . HLD (hyperlipidemia)    a. statin intolerant 2/2 myalgias  . Hypercholesterolemia   . Hypertension   . Malignant neoplasm of upper-outer quadrant of female breast ([place]) 10/2012   Papillary DCIS, sentinel node negative. Selena/PR positive. PARTIAL RIGHT MASTECTOMY FOR BREAST CANCER--HAD RADIATION - NO CHEMO --Selena Bush ONCOLOGIST  . Obesity   . OSA on CPAP   . Osteoarthritis of both knees    a. s/p right TKA 04/2013 & left TKA 09/2014  . Otitis externa   . Personal history of radiation therapy 2015   RIGHT breast-mammosite per pt  . Sleep difficulties    LUNESTA HAS HELPED  . Vaginal cyst      Social History   Socioeconomic History  . Marital status: Widowed    Spouse name: Selena Bush  . Number of children: 2  . Years of education: College  . Highest education level: Bachelor's degree (e.g., BA, AB, BS)  Occupational History  . Occupation: Retired  Scientific laboratory technician  . Financial resource strain: Not hard at all  . Food insecurity:    Worry: Never true    Inability: Never true  . Transportation needs:    Medical: No    Non-medical: No  Tobacco Use  . Smoking status: Never Smoker  . Smokeless tobacco: Never Used  . Tobacco comment: social smoker as a teen  Substance and Sexual Activity  . Alcohol use: No  .  Drug use: No  . Sexual activity: Not Currently    Birth control/protection: Post-menopausal, Surgical  Lifestyle  . Physical activity:    Days per week: 7 days    Minutes per session: 20 min  . Stress: Only a little  Relationships  . Social connections:    Talks on phone: More than three times a week    Gets together: More than three times a week    Attends religious service: More than 4 times per year    Active member of club or organization: Yes    Attends meetings of clubs or organizations: More than 4 times per year    Relationship status: Widowed  . Intimate partner violence:    Fear of current or ex partner: No    Emotionally  abused: No    Physically abused: No    Forced sexual activity: No  Other Topics Concern  . Not on file  Social History Narrative   Marital status: widowed since 09/16/2015      Children: 2 children (44, 51); 5 grandchild (4 in [place]; 1 in [place]).      Lives: alone in townhome; 1 dog, 2 cats      Employment: psychiatric Education officer, museum; retired in 2015      Tobacco: teenager only      Alcohol:  None      Drugs: none      Exercise: walking in 2019; more active in 2019.  Walking daily small amounts.        ADLs: drives; independent with ADLs; no assistant devices      Advanced Directives: none; FULL CODE; no prolonged measures.   Does not need them.  Daughter/Selena Bush is HCPOA.     Past Surgical History:  Procedure Laterality Date  . ABDOMINAL HYSTERECTOMY  1992   DUB; fibroids; endometriosis.  One remaining ovary.    [name] BREAST LUMPECTOMY Right 2015   Papillary DCIS, sentinel node negative. Selena/PR positive. PARTIAL RIGHT MASTECTOMY FOR BREAST CANCER--HAD RADIATION - NO CHEMO --Selena. Jennings ONCOLOGIST  . BREAST SURGERY Right March 2014   Wide excision,APB RT 10 mm papillary DCIS, ER/PR positive. Sentinel node negative. Partial breast radiation.  [name] CATARACT EXTRACTION, BILATERAL  02/13/2016   Selena Bush.  . CHOLECYSTECTOMY  1994  . COLONOSCOPY  2015   1 benign polyp-every 5 years/ Selena Bush  . ERCP  1995  . JOINT REPLACEMENT Right Sept 2014   knee  . PERICARDIOCENTESIS N/A 11/13/2017   Procedure: PERICARDIOCENTESIS;  Surgeon: Selena Bush, MD;  Location: [facility];  Service: Cardiovascular;  Laterality: N/A;  . TOTAL KNEE ARTHROPLASTY Right 04/16/2013   Procedure: RIGHT TOTAL KNEE ARTHROPLASTY;  Surgeon: Selena Pole, MD;  Location: [facility];  Service: Orthopedics;  Laterality: Right;  . TOTAL KNEE ARTHROPLASTY Left 09/15/2014   Procedure: LEFT TOTAL KNEE ARTHROPLASTY;  Surgeon: Selena Pole, MD;  Location: [facility];  Service: Orthopedics;  Laterality: Left;  . TUBAL  LIGATION  1979    Family History  Problem Relation Age of Onset  . Colon cancer Mother   . Cancer Mother 38       colon cancer  . Melanoma Father   . Cancer Father 3       melanoma  . Colon cancer Maternal Grandfather   . Breast cancer Maternal Grandfather     Allergies  Allergen Reactions  . Penicillins Anaphylaxis    anaphylaxis  Has patient had a PCN reaction causing immediate rash, facial/tongue/throat swelling,  SOB or lightheadedness with hypotension: Yes Has patient had a PCN reaction causing severe rash involving mucus membranes or skin necrosis: No Has patient had a PCN reaction that required hospitalization: No Has patient had a PCN reaction occurring within the last 10 years: No If all of the above answers are "NO", then may proceed with Cephalosporin use.   . Sulfa Antibiotics Anaphylaxis  . Codeine Other (See Comments) and Nausea And Vomiting    Gi problems   . Statins Other (See Comments)    Leg pains  . Aspirin Other (See Comments)    "burned my stomach intensely" Abdominal pain and burning    Current Outpatient Medications on File Prior to Visit  Medication Sig Dispense Refill  . albuterol (PROVENTIL HFA;VENTOLIN HFA) 108 (90 Base) MCG/ACT inhaler Inhale 2 puffs into the lungs every 4 (four) hours as needed for wheezing or shortness of breath. 1 Inhaler 0  . benzonatate (TESSALON) 200 MG capsule Take 1 capsule (200 mg total) by mouth 3 (three) times daily as needed for cough. 15 capsule 0  . colchicine 0.6 MG tablet Take 1 tablet (0.6 mg total) by mouth 2 (two) times daily. (Patient taking differently: Take 0.6 mg by mouth 2 (two) times daily as needed (for gout). ) 30 tablet 0  . diltiazem (CARDIZEM CD) 180 MG 24 hr capsule Take 1 capsule (180 mg total) by mouth daily. 90 capsule 2  . DULoxetine (CYMBALTA) 60 MG capsule Take 1 capsule (60 mg total) by mouth 2 (two) times daily. 180 capsule 1  . fluticasone (FLONASE) 50 MCG/ACT nasal spray Place 2 sprays  into both nostrils daily as needed for rhinitis or allergies. 17 g 11  . furosemide (LASIX) 40 MG tablet Take 40 mg by mouth daily.  11  . gabapentin (NEURONTIN) 100 MG capsule Take 1 capsule (100 mg total) by mouth 3 (three) times daily. 270 capsule 2  . Melatonin 5 MG TABS Take by mouth.    . metoprolol tartrate (LOPRESSOR) 25 MG tablet TAKE 3 TABLETS (75 MG) BY MOUTH 2 (TWO) TIMES DAILY. PLEASE SCHEDULE APPT FOR REFILLS 180 tablet 6  . potassium chloride SA (KLOR-CON M20) 20 MEQ tablet Take 2 tablets (40 mEq total) by mouth 2 (two) times daily. 120 tablet 5  . tiZANidine (ZANAFLEX) 4 MG tablet TAKE 1 TABLET BY MOUTH AT SUPPER AND TAKE 1 TABLET BY MOUTH AT BEDTIME 180 tablet 5  . sotalol (BETAPACE) 120 MG tablet Take 1 tablet (120 mg total) by mouth every 12 (twelve) hours. 60 tablet 11   No current facility-administered medications on file prior to visit.     BP 122/74   Pulse 64   Temp 98.2 F (36.8 C) (Oral)   Ht 5\' 2"  (1.575 m)   Wt 209 lb 8 oz (95 kg)   SpO2 99%   BMI 38.32 kg/m    Objective:   Physical Exam  Constitutional: She is oriented to person, place, and time. She appears well-nourished.  Eyes: EOM are normal.  Cardiovascular: Normal rate and regular rhythm.  Respiratory: Effort normal and breath sounds normal.  Neurological: She is alert and oriented to person, place, and time. No cranial nerve deficit.  Skin: Skin is warm and dry.  3 cm x 0.5 cm abrasion to left forehead proximal to eyebrow.  Mild pink in color, no surrounding erythema/drainage.  Nontender.           Assessment & Plan:  Fall:  Accidental fall with minor injury. Evaluated  in the emergency department, exam and tests unremarkable. Neuro exam negative today.  Abrasion has nearly healed, bandage removed. Discussed cover with bandage when using CPAP machine at night, otherwise okay without bandage. Seems to be doing well overall. Return precautions provided.  Hospital notes and imaging  reviewed. Pleas Koch, NP

## 2018-09-05 NOTE — Patient Instructions (Signed)
Apply a small bandage to the forehead when sleeping with your CPAP machine at night.   You don't need a bandage during the day.  Please notify us if you develop severe headaches and/or dizziness.  It was a pleasure to see you today!

## 2018-09-06 ENCOUNTER — Telehealth: Payer: Self-pay | Admitting: Primary Care

## 2018-09-06 NOTE — Telephone Encounter (Signed)
Left message asking pt to call office  Please let pt know her appointment on 3/3 has been changed from medicare wellness visit to lab only visit.  Lattie Haw will be out of the office

## 2018-09-12 DIAGNOSIS — I10 Essential (primary) hypertension: Secondary | ICD-10-CM | POA: Diagnosis not present

## 2018-09-12 DIAGNOSIS — G4733 Obstructive sleep apnea (adult) (pediatric): Secondary | ICD-10-CM | POA: Diagnosis not present

## 2018-09-17 ENCOUNTER — Other Ambulatory Visit: Payer: Self-pay | Admitting: Cardiology

## 2018-09-17 NOTE — Telephone Encounter (Signed)
Rx request sent to pharmacy.  

## 2018-09-18 ENCOUNTER — Telehealth: Payer: Self-pay | Admitting: Internal Medicine

## 2018-09-18 DIAGNOSIS — F331 Major depressive disorder, recurrent, moderate: Secondary | ICD-10-CM | POA: Diagnosis not present

## 2018-09-18 DIAGNOSIS — F4312 Post-traumatic stress disorder, chronic: Secondary | ICD-10-CM | POA: Diagnosis not present

## 2018-09-18 NOTE — Telephone Encounter (Signed)
Called pt and notified that she will need f/u on cpap before she has been on machine 3 months. She has been on 2 weeks and will call back to schedule f/u.

## 2018-10-04 DIAGNOSIS — F331 Major depressive disorder, recurrent, moderate: Secondary | ICD-10-CM | POA: Diagnosis not present

## 2018-10-04 DIAGNOSIS — F4312 Post-traumatic stress disorder, chronic: Secondary | ICD-10-CM | POA: Diagnosis not present

## 2018-10-05 ENCOUNTER — Ambulatory Visit
Admission: RE | Admit: 2018-10-05 | Discharge: 2018-10-05 | Disposition: A | Discharge status: Home / Self Care | Payer: PPO | Source: Ambulatory Visit | Attending: Surgery | Admitting: Surgery

## 2018-10-05 DIAGNOSIS — D0511 Intraductal carcinoma in situ of right breast: Secondary | ICD-10-CM | POA: Diagnosis not present

## 2018-10-05 DIAGNOSIS — Z853 Personal history of malignant neoplasm of breast: Secondary | ICD-10-CM | POA: Diagnosis not present

## 2018-10-05 DIAGNOSIS — R928 Other abnormal and inconclusive findings on diagnostic imaging of breast: Secondary | ICD-10-CM | POA: Diagnosis not present

## 2018-10-08 ENCOUNTER — Other Ambulatory Visit: Payer: Self-pay | Admitting: Primary Care

## 2018-10-08 DIAGNOSIS — I10 Essential (primary) hypertension: Secondary | ICD-10-CM

## 2018-10-08 DIAGNOSIS — E7800 Pure hypercholesterolemia, unspecified: Secondary | ICD-10-CM

## 2018-10-11 ENCOUNTER — Ambulatory Visit (INDEPENDENT_AMBULATORY_CARE_PROVIDER_SITE_OTHER): Payer: PPO | Admitting: General Surgery

## 2018-10-11 ENCOUNTER — Other Ambulatory Visit: Payer: Self-pay

## 2018-10-11 ENCOUNTER — Encounter: Payer: Self-pay | Admitting: General Surgery

## 2018-10-11 VITALS — BP 128/88 | HR 69 | Temp 98.1°F | Resp 16 | Ht 62.0 in | Wt 211.2 lb

## 2018-10-11 DIAGNOSIS — D0511 Intraductal carcinoma in situ of right breast: Secondary | ICD-10-CM | POA: Diagnosis not present

## 2018-10-11 NOTE — Progress Notes (Signed)
Patient ID: Selena Bush, female   DOB: April 23, 1946, 73 y.o.   MRN: [id number]  Chief Complaint  Patient presents with  . Follow-up    bilateral diagnostic mammogram     HPI Selena Bush is a 73 y.o. female.  Here today for follow up 1 year bilateral diagnostic mammogram. No complaints.  The patient completed 5 years of tamoxifen therapy in spring 2019.  She has been retired for 4-5 years and enjoying this immensely.  HPI  Past Medical History:  Diagnosis Date  . Anxiety   . Atrial fibrillation ([place])   . Chronic diastolic CHF (congestive heart failure) ([place])    a. echo 09/2014: EF 75-17%, diastolic dysfunction, mild LVH, nl RV size & systolic function, mildly dilated LA (4.3 cm), mild MR/TR, mildly elevated PASP 36.7 mm Hg  . DDD (degenerative disc disease), cervical   . DDD (degenerative disc disease), lumbar   . Depression   . Diffuse cystic mastopathy 2014  . Dyspnea   . GERD (gastroesophageal reflux disease)   . Headache    rare  . HLD (hyperlipidemia)    a. statin intolerant 2/2 myalgias  . Hypercholesterolemia   . Hypertension   . Malignant neoplasm of upper-outer quadrant of female breast ([place]) 10/2012   Papillary DCIS, sentinel node negative. DR/PR positive. PARTIAL RIGHT MASTECTOMY FOR BREAST CANCER--HAD RADIATION - NO CHEMO --DR. [place] ONCOLOGIST  . Obesity   . OSA on CPAP   . Osteoarthritis of both knees    a. s/p right TKA 04/2013 & left TKA 09/2014  . Otitis externa   . Personal history of radiation therapy 2015   RIGHT breast-mammosite per pt  . Sleep difficulties    LUNESTA HAS HELPED  . Vaginal cyst     Past Surgical History:  Procedure Laterality Date  . ABDOMINAL HYSTERECTOMY  1992   DUB; fibroids; endometriosis.  One remaining ovary.    [name] BREAST LUMPECTOMY Right 2015   Papillary DCIS, sentinel node negative. DR/PR positive. PARTIAL RIGHT MASTECTOMY FOR BREAST CANCER--HAD RADIATION - NO CHEMO --DR. [place] ONCOLOGIST  .  BREAST SURGERY Right March 2014   Wide excision,APB RT 10 mm papillary DCIS, ER/PR positive. Sentinel node negative. Partial breast radiation.  [name] CATARACT EXTRACTION, BILATERAL  02/13/2016   [facility].  . CHOLECYSTECTOMY  1994  . COLONOSCOPY  2015   1 benign polyp-every 5 years/ Dr Candace Cruise  . ERCP  1995  . JOINT REPLACEMENT Right Sept 2014   knee  . PERICARDIOCENTESIS N/A 11/13/2017   Procedure: PERICARDIOCENTESIS;  Surgeon: Nelva Bush, MD;  Location: [facility];  Service: Cardiovascular;  Laterality: N/A;  . TOTAL KNEE ARTHROPLASTY Right 04/16/2013   Procedure: RIGHT TOTAL KNEE ARTHROPLASTY;  Surgeon: Mauri Pole, MD;  Location: [facility];  Service: Orthopedics;  Laterality: Right;  . TOTAL KNEE ARTHROPLASTY Left 09/15/2014   Procedure: LEFT TOTAL KNEE ARTHROPLASTY;  Surgeon: Mauri Pole, MD;  Location: [facility];  Service: Orthopedics;  Laterality: Left;  . TUBAL LIGATION  1979    Family History  Problem Relation Age of Onset  . Colon cancer Mother   . Cancer Mother 34       colon cancer  . Melanoma Father   . Cancer Father 98       melanoma  . Colon cancer Maternal Grandfather   . Breast cancer Maternal Grandfather     Social History Social History   Tobacco Use  . Smoking status: Never Smoker  . Smokeless  tobacco: Never Used  . Tobacco comment: social smoker as a teen  Substance Use Topics  . Alcohol use: No  . Drug use: No    Allergies  Allergen Reactions  . Penicillins Anaphylaxis    anaphylaxis  Has patient had a PCN reaction causing immediate rash, facial/tongue/throat swelling, SOB or lightheadedness with hypotension: Yes Has patient had a PCN reaction causing severe rash involving mucus membranes or skin necrosis: No Has patient had a PCN reaction that required hospitalization: No Has patient had a PCN reaction occurring within the last 10 years: No If all of the above answers are "NO", then may proceed with Cephalosporin use.   . Sulfa Antibiotics  Anaphylaxis  . Codeine Other (See Comments) and Nausea And Vomiting    Gi problems   . Statins Other (See Comments)    Leg pains  . Aspirin Other (See Comments)    "burned my stomach intensely" Abdominal pain and burning    Current Outpatient Medications  Medication Sig Dispense Refill  . albuterol (PROVENTIL HFA;VENTOLIN HFA) 108 (90 Base) MCG/ACT inhaler Inhale 2 puffs into the lungs every 4 (four) hours as needed for wheezing or shortness of breath. 1 Inhaler 0  . benzonatate (TESSALON) 200 MG capsule Take 1 capsule (200 mg total) by mouth 3 (three) times daily as needed for cough. 15 capsule 0  . colchicine 0.6 MG tablet Take 1 tablet (0.6 mg total) by mouth 2 (two) times daily. (Patient taking differently: Take 0.6 mg by mouth 2 (two) times daily as needed (for gout). ) 30 tablet 0  . diltiazem (CARDIZEM CD) 180 MG 24 hr capsule Take 1 capsule (180 mg total) by mouth daily. 90 capsule 2  . DULoxetine (CYMBALTA) 60 MG capsule Take 1 capsule (60 mg total) by mouth 2 (two) times daily. 180 capsule 1  . fluticasone (FLONASE) 50 MCG/ACT nasal spray Place 2 sprays into both nostrils daily as needed for rhinitis or allergies. 17 g 11  . furosemide (LASIX) 40 MG tablet Take 40 mg by mouth daily.  11  . gabapentin (NEURONTIN) 100 MG capsule Take 1 capsule (100 mg total) by mouth 3 (three) times daily. 270 capsule 2  . Melatonin 5 MG TABS Take by mouth.    . metoprolol tartrate (LOPRESSOR) 25 MG tablet TAKE 3 TABLETS (75 MG) BY MOUTH 2 (TWO) TIMES DAILY. PLEASE SCHEDULE APPT FOR REFILLS 180 tablet 5  . potassium chloride SA (KLOR-CON M20) 20 MEQ tablet Take 2 tablets (40 mEq total) by mouth 2 (two) times daily. 120 tablet 5  . tiZANidine (ZANAFLEX) 4 MG tablet TAKE 1 TABLET BY MOUTH AT SUPPER AND TAKE 1 TABLET BY MOUTH AT BEDTIME 180 tablet 5  . sotalol (BETAPACE) 120 MG tablet Take 1 tablet (120 mg total) by mouth every 12 (twelve) hours. 60 tablet 11   No current facility-administered  medications for this visit.     Review of Systems Review of Systems  Constitutional: Negative.   Respiratory: Negative.   Cardiovascular: Negative.     Blood pressure 128/88, pulse 69, temperature 98.1 F (36.7 C), temperature source Temporal, resp. rate 16, height 5\' 2"  (1.575 m), weight 211 lb 3.2 oz (95.8 kg), SpO2 96 %.  Physical Exam Physical Exam Constitutional:      Appearance: She is well-developed.  Eyes:     General: No scleral icterus.    Conjunctiva/sclera: Conjunctivae normal.  Neck:     Musculoskeletal: Normal range of motion.  Cardiovascular:     Rate  and Rhythm: Normal rate and regular rhythm.     Heart sounds: Normal heart sounds.  Pulmonary:     Effort: Pulmonary effort is normal.     Breath sounds: Normal breath sounds.  Chest:     Breasts:        Right: No inverted nipple, mass, nipple discharge, skin change or tenderness.        Left: No inverted nipple, mass, nipple discharge, skin change or tenderness.    Lymphadenopathy:     Cervical: No cervical adenopathy.     Upper Body:     Right upper body: No supraclavicular or axillary adenopathy.     Left upper body: No supraclavicular or axillary adenopathy.  Skin:    General: Skin is warm and dry.  Neurological:     Mental Status: She is alert and oriented to person, place, and time.     Data Reviewed Bilateral diagnostic mammograms dated October 05, 2018 were reviewed.  Postsurgical changes.  BI-RADS-2.  Assessment No evidence of recurrent malignancy.  Plan We will see you back in 1 year Bilateral screening mammograms.    HPI, Physical Exam, Assessment and Plan have been scribed under the direction and in the presence of Robert Bellow, MD. Selena Bush, CMA  I have completed the exam and reviewed the above documentation for accuracy and completeness.  I agree with the above.  Haematologist has been used and any errors in dictation or transcription are unintentional.  Hervey Ard, M.D., F.A.C.S.  Forest Gleason Byrnett 10/11/2018, 7:55 PM

## 2018-10-11 NOTE — Patient Instructions (Signed)
We will see you back in 1 year Bilateral mammogram.

## 2018-10-13 DIAGNOSIS — G4733 Obstructive sleep apnea (adult) (pediatric): Secondary | ICD-10-CM | POA: Diagnosis not present

## 2018-10-13 DIAGNOSIS — I10 Essential (primary) hypertension: Secondary | ICD-10-CM | POA: Diagnosis not present

## 2018-10-16 ENCOUNTER — Other Ambulatory Visit (INDEPENDENT_AMBULATORY_CARE_PROVIDER_SITE_OTHER): Payer: PPO

## 2018-10-16 DIAGNOSIS — E7800 Pure hypercholesterolemia, unspecified: Secondary | ICD-10-CM

## 2018-10-16 DIAGNOSIS — F331 Major depressive disorder, recurrent, moderate: Secondary | ICD-10-CM | POA: Diagnosis not present

## 2018-10-16 DIAGNOSIS — I10 Essential (primary) hypertension: Secondary | ICD-10-CM | POA: Diagnosis not present

## 2018-10-16 DIAGNOSIS — F4312 Post-traumatic stress disorder, chronic: Secondary | ICD-10-CM | POA: Diagnosis not present

## 2018-10-16 LAB — COMPREHENSIVE METABOLIC PANEL
ALT: 14 U/L (ref 0–35)
AST: 16 U/L (ref 0–37)
Albumin: 4.1 g/dL (ref 3.5–5.2)
Alkaline Phosphatase: 114 U/L (ref 39–117)
BUN: 19 mg/dL (ref 6–23)
CO2: 32 mEq/L (ref 19–32)
Calcium: 10 mg/dL (ref 8.4–10.5)
Chloride: 99 mEq/L (ref 96–112)
Creatinine, Ser: 0.97 mg/dL (ref 0.40–1.20)
GFR: 56.71 mL/min — ABNORMAL LOW (ref >60.00)
GLUCOSE: 90 mg/dL (ref 70–99)
Potassium: 4.4 mEq/L (ref 3.5–5.1)
Sodium: 138 mEq/L (ref 135–145)
Total Bilirubin: 0.4 mg/dL (ref 0.2–1.2)
Total Protein: 7.2 g/dL (ref 6.0–8.3)

## 2018-10-16 LAB — LIPID PANEL
Cholesterol: 245 mg/dL — ABNORMAL HIGH (ref 0–200)
HDL: 60.9 mg/dL (ref >39.00)
LDL Cholesterol: 145 mg/dL — ABNORMAL HIGH (ref 0–99)
NONHDL: 184.47
Total CHOL/HDL Ratio: 4
Triglycerides: 198 mg/dL — ABNORMAL HIGH (ref 0.0–149.0)
VLDL: 39.6 mg/dL (ref 0.0–40.0)

## 2018-10-16 LAB — HEMOGLOBIN A1C: Hgb A1c MFr Bld: 6 % (ref 4.6–6.5)

## 2018-10-17 ENCOUNTER — Telehealth: Payer: Self-pay

## 2018-10-17 NOTE — Telephone Encounter (Signed)
Message left for patient to return my call.  

## 2018-10-17 NOTE — Telephone Encounter (Signed)
[facility] Night - Client Nonclinical Telephone Record [facility] Night - Client Client Site [place] - Night Physician [name] - NP Contact Type Call Who Is Calling Patient / Member / Family / Caregiver Caller Name [place] Phone Number [phone] Call Type Message Only Information Provided Reason for Call Returning a Call from the Office Initial [place] states she is returning a call from the office. No symptoms reported and the option to speak with an afterhours nurse was declined. Additional Comment Call Closed By: Carlis Stable Transaction Date/Time: 10/16/2018 5:35:59 PM (ET)

## 2018-10-18 NOTE — Telephone Encounter (Signed)
Message left for patient to return my call.  Will close note since patient will be seen on 10/19/2018

## 2018-10-18 NOTE — Telephone Encounter (Signed)
Pt returning call to nurse. Please call pt °

## 2018-10-19 ENCOUNTER — Encounter: Payer: Self-pay | Admitting: Primary Care

## 2018-10-19 ENCOUNTER — Ambulatory Visit (INDEPENDENT_AMBULATORY_CARE_PROVIDER_SITE_OTHER): Payer: PPO | Admitting: Primary Care

## 2018-10-19 VITALS — BP 136/86 | HR 74 | Temp 97.9°F | Ht 62.0 in | Wt 211.0 lb

## 2018-10-19 DIAGNOSIS — R7303 Prediabetes: Secondary | ICD-10-CM | POA: Diagnosis not present

## 2018-10-19 DIAGNOSIS — E7800 Pure hypercholesterolemia, unspecified: Secondary | ICD-10-CM

## 2018-10-19 DIAGNOSIS — I10 Essential (primary) hypertension: Secondary | ICD-10-CM

## 2018-10-19 DIAGNOSIS — F419 Anxiety disorder, unspecified: Secondary | ICD-10-CM

## 2018-10-19 DIAGNOSIS — F324 Major depressive disorder, single episode, in partial remission: Secondary | ICD-10-CM

## 2018-10-19 DIAGNOSIS — D0511 Intraductal carcinoma in situ of right breast: Secondary | ICD-10-CM | POA: Diagnosis not present

## 2018-10-19 DIAGNOSIS — Z23 Encounter for immunization: Secondary | ICD-10-CM | POA: Diagnosis not present

## 2018-10-19 DIAGNOSIS — M5136 Other intervertebral disc degeneration, lumbar region: Secondary | ICD-10-CM | POA: Diagnosis not present

## 2018-10-19 DIAGNOSIS — M51369 Other intervertebral disc degeneration, lumbar region without mention of lumbar back pain or lower extremity pain: Secondary | ICD-10-CM

## 2018-10-19 DIAGNOSIS — G4733 Obstructive sleep apnea (adult) (pediatric): Secondary | ICD-10-CM

## 2018-10-19 DIAGNOSIS — Z0000 Encounter for general adult medical examination without abnormal findings: Secondary | ICD-10-CM

## 2018-10-19 DIAGNOSIS — I5032 Chronic diastolic (congestive) heart failure: Secondary | ICD-10-CM

## 2018-10-19 MED ORDER — ZOSTER VAC RECOMB ADJUVANTED 50 MCG/0.5ML IM SUSR: 0.5000 mL | Freq: Once | INTRAMUSCULAR | 1 refills | Status: AC

## 2018-10-19 NOTE — Assessment & Plan Note (Signed)
Cannot tolerate statin therapy nor Zetia. LDL above goal today. ASCVD risk score 14%.  Following with cardiology. She continues to decline treatment.

## 2018-10-19 NOTE — Assessment & Plan Note (Signed)
Takes nightly for back pain with improvement. Also on gabapentin for anxiety. Continue both.

## 2018-10-19 NOTE — Assessment & Plan Note (Signed)
Appears euvolvemic today, repeat echo in October 2019 improved. Continue furosemide and current regimen.

## 2018-10-19 NOTE — Assessment & Plan Note (Signed)
A1C of 6.0 which is higher than last check but overall stable over the years. Recommended to work on diet and exercise.

## 2018-10-19 NOTE — Progress Notes (Signed)
Subjective:    Patient ID: Selena Bush, female    DOB: September 18, 1945, 73 y.o.   MRN: [id number]  HPI  Selena Bush is a 73 year old female who presents today for complete physical.  Immunizations: -Tetanus: Completed in 2020 -Influenza: Completed this season  -Pneumonia: Completed in 2016 and 2017 -Shingles: Never completed   Diet: She endorses a healthy diet. Breakfast: Cereal with fruit, eggs, bacon Lunch: Skips Dinner: Vegetables, lean protein, occasional steak Snacks: None Desserts: 2-3 times weekly  Beverages: Water, decaf sweet tea with artificial sweetner, coffee  Exercise: Walking three times weekly, 15 minutes at a time. Eye exam: Completed in 2019 Dental exam: Completes semi-annually  Colonoscopy: Completed in 2015, due in 2025 Dexa: No recent exam, declines.  Mammogram: Completed in 2020 Hep C Screen: Negative   BP Readings from Last 3 Encounters:  10/19/18 136/86  10/11/18 128/88  09/05/18 122/74   The 10-year ASCVD risk score Selena Bush Selena Jr., et al., 2013) is: 14.7%   Values used to calculate the score:     Age: 61 years     Sex: Female     Is Non-Hispanic African American: No     Diabetic: No     Tobacco smoker: No     Systolic Blood Pressure: 782 mmHg     Is BP treated: Yes     HDL Cholesterol: 60.9 mg/dL     Total Cholesterol: 245 mg/dL    Review of Systems  Constitutional: Negative for unexpected weight change.  HENT: Negative for rhinorrhea.   Respiratory: Negative for cough and shortness of breath.   Cardiovascular: Negative for chest pain.  Gastrointestinal: Negative for constipation and diarrhea.  Genitourinary: Negative for difficulty urinating.  Musculoskeletal: Positive for arthralgias.  Skin: Negative for rash.  Allergic/Immunologic: Negative for environmental allergies.  Neurological: Negative for dizziness and headaches.  Psychiatric/Behavioral: Negative for suicidal ideas.       Feels well managed on current regimen       Past  Medical History:  Diagnosis Date  . Anxiety   . Atrial fibrillation ([place])   . Chronic diastolic CHF (congestive heart failure) ([place])    a. echo 09/2014: EF 95-62%, diastolic dysfunction, mild LVH, nl RV size & systolic function, mildly dilated LA (4.3 cm), mild MR/TR, mildly elevated PASP 36.7 mm Hg  . DDD (degenerative disc disease), cervical   . DDD (degenerative disc disease), lumbar   . Depression   . Diffuse cystic mastopathy 2014  . Dyspnea   . GERD (gastroesophageal reflux disease)   . Gout   . Headache    rare  . HLD (hyperlipidemia)    a. statin intolerant 2/2 myalgias  . Hypercholesterolemia   . Hypertension   . Malignant neoplasm of upper-outer quadrant of female breast ([place]) 10/2012   Papillary DCIS, sentinel node negative. DR/PR positive. PARTIAL RIGHT MASTECTOMY FOR BREAST CANCER--HAD RADIATION - NO CHEMO --DR. [place] ONCOLOGIST  . Obesity   . OSA on CPAP   . Osteoarthritis of both knees    a. s/p right TKA 04/2013 & left TKA 09/2014  . Otitis externa   . Personal history of radiation therapy 2015   RIGHT breast-mammosite per pt  . Sleep difficulties    LUNESTA HAS HELPED  . Vaginal cyst      Social History   Socioeconomic History  . Marital status: Widowed    Spouse name: Selena Bush  . Number of children: 2  . Years of education: College  . Highest  education level: Bachelor's degree (e.g., BA, AB, BS)  Occupational History  . Occupation: Retired  Scientific laboratory technician  . Financial resource strain: Not hard at all  . Food insecurity:    Worry: Never true    Inability: Never true  . Transportation needs:    Medical: No    Non-medical: No  Tobacco Use  . Smoking status: Never Smoker  . Smokeless tobacco: Never Used  . Tobacco comment: social smoker as a teen  Substance and Sexual Activity  . Alcohol use: No  . Drug use: No  . Sexual activity: Not Currently    Birth control/protection: Post-menopausal, Surgical  Lifestyle  . Physical activity:     Days per week: 7 days    Minutes per session: 20 min  . Stress: Only a little  Relationships  . Social connections:    Talks on phone: More than three times a week    Gets together: More than three times a week    Attends religious service: More than 4 times per year    Active member of club or organization: Yes    Attends meetings of clubs or organizations: More than 4 times per year    Relationship status: Widowed  . Intimate partner violence:    Fear of current or ex partner: No    Emotionally abused: No    Physically abused: No    Forced sexual activity: No  Other Topics Concern  . Not on file  Social History Narrative   Marital status: widowed since 09/16/2015      Children: 2 children (20, 1); 5 grandchild (4 in [place]; 1 in [place]).      Lives: alone in townhome; 1 dog, 2 cats      Employment: psychiatric Education officer, museum; retired in 2015      Tobacco: teenager only      Alcohol:  None      Drugs: none      Exercise: walking in 2019; more active in 2019.  Walking daily small amounts.        ADLs: drives; independent with ADLs; no assistant devices      Advanced Directives: none; FULL CODE; no prolonged measures.   Does not need them.  Daughter/Selena Bush is HCPOA.     Past Surgical History:  Procedure Laterality Date  . ABDOMINAL HYSTERECTOMY  1992   DUB; fibroids; endometriosis.  One remaining ovary.    [name] BREAST LUMPECTOMY Right 2015   Papillary DCIS, sentinel node negative. DR/PR positive. PARTIAL RIGHT MASTECTOMY FOR BREAST CANCER--HAD RADIATION - NO CHEMO --DR. [place] ONCOLOGIST  . BREAST SURGERY Right March 2014   Wide excision,APB RT 10 mm papillary DCIS, ER/PR positive. Sentinel node negative. Partial breast radiation.  [name] CATARACT EXTRACTION, BILATERAL  02/13/2016   [facility].  . CHOLECYSTECTOMY  1994  . COLONOSCOPY  2015   1 benign polyp-every 5 years/ Dr Selena Bush  . ERCP  1995  . JOINT REPLACEMENT Right Sept 2014   knee  . PERICARDIOCENTESIS N/A  11/13/2017   Procedure: PERICARDIOCENTESIS;  Surgeon: Selena Bush, MD;  Location: [facility];  Service: Cardiovascular;  Laterality: N/A;  . TOTAL KNEE ARTHROPLASTY Right 04/16/2013   Procedure: RIGHT TOTAL KNEE ARTHROPLASTY;  Surgeon: Mauri Pole, MD;  Location: [facility];  Service: Orthopedics;  Laterality: Right;  . TOTAL KNEE ARTHROPLASTY Left 09/15/2014   Procedure: LEFT TOTAL KNEE ARTHROPLASTY;  Surgeon: Mauri Pole, MD;  Location: [facility];  Service: Orthopedics;  Laterality: Left;  .  TUBAL LIGATION  1979    Family History  Problem Relation Age of Onset  . Colon cancer Mother   . Cancer Mother 23       colon cancer  . Melanoma Father   . Cancer Father 21       melanoma  . Colon cancer Maternal Grandfather   . Breast cancer Maternal Grandfather     Allergies  Allergen Reactions  . Penicillins Anaphylaxis    anaphylaxis  Has patient had a PCN reaction causing immediate rash, facial/tongue/throat swelling, SOB or lightheadedness with hypotension: Yes Has patient had a PCN reaction causing severe rash involving mucus membranes or skin necrosis: No Has patient had a PCN reaction that required hospitalization: No Has patient had a PCN reaction occurring within the last 10 years: No If all of the above answers are "NO", then may proceed with Cephalosporin use.   . Sulfa Antibiotics Anaphylaxis  . Codeine Other (See Comments) and Nausea And Vomiting    Gi problems   . Statins Other (See Comments)    Leg pains  . Aspirin Other (See Comments)    "burned my stomach intensely" Abdominal pain and burning    Current Outpatient Medications on File Prior to Visit  Medication Sig Dispense Refill  . albuterol (PROVENTIL HFA;VENTOLIN HFA) 108 (90 Base) MCG/ACT inhaler Inhale 2 puffs into the lungs every 4 (four) hours as needed for wheezing or shortness of breath. 1 Inhaler 0  . benzonatate (TESSALON) 200 MG capsule Take 1 capsule (200 mg total) by mouth 3 (three) times daily  as needed for cough. 15 capsule 0  . diltiazem (CARDIZEM CD) 180 MG 24 hr capsule Take 1 capsule (180 mg total) by mouth daily. 90 capsule 2  . DULoxetine (CYMBALTA) 60 MG capsule Take 1 capsule (60 mg total) by mouth 2 (two) times daily. 180 capsule 1  . fluticasone (FLONASE) 50 MCG/ACT nasal spray Place 2 sprays into both nostrils daily as needed for rhinitis or allergies. 17 g 11  . furosemide (LASIX) 40 MG tablet Take 40 mg by mouth daily.  11  . gabapentin (NEURONTIN) 100 MG capsule Take 1 capsule (100 mg total) by mouth 3 (three) times daily. 270 capsule 2  . Melatonin 5 MG TABS Take by mouth.    . metoprolol tartrate (LOPRESSOR) 25 MG tablet TAKE 3 TABLETS (75 MG) BY MOUTH 2 (TWO) TIMES DAILY. PLEASE SCHEDULE APPT FOR REFILLS 180 tablet 5  . potassium chloride SA (KLOR-CON M20) 20 MEQ tablet Take 2 tablets (40 mEq total) by mouth 2 (two) times daily. 120 tablet 5  . tiZANidine (ZANAFLEX) 4 MG tablet TAKE 1 TABLET BY MOUTH AT SUPPER AND TAKE 1 TABLET BY MOUTH AT BEDTIME 180 tablet 5  . sotalol (BETAPACE) 120 MG tablet Take 1 tablet (120 mg total) by mouth every 12 (twelve) hours. 60 tablet 11   No current facility-administered medications on file prior to visit.     BP 136/86   Pulse 74   Temp 97.9 F (36.6 C) (Oral)   Ht 5\' 2"  (1.575 m)   Wt 211 lb (95.7 kg)   SpO2 93%   BMI 38.59 kg/m    Objective:   Physical Exam  Constitutional: She is oriented to person, place, and time. She appears well-nourished.  HENT:  Mouth/Throat: No oropharyngeal exudate.  Eyes: Pupils are equal, round, and reactive to light. EOM are normal.  Neck: Neck supple. No thyromegaly present.  Cardiovascular: Normal rate and regular rhythm.  Respiratory:  Effort normal and breath sounds normal.  GI: Soft. Bowel sounds are normal. There is no abdominal tenderness.  Musculoskeletal: Normal range of motion.  Neurological: She is alert and oriented to person, place, and time.  Skin: Skin is warm and dry.    Psychiatric: She has a normal mood and affect.           Assessment & Plan:

## 2018-10-19 NOTE — Assessment & Plan Note (Signed)
Stable in the office today, continue current regimen. 

## 2018-10-19 NOTE — Assessment & Plan Note (Signed)
Recent mammogram negative, now considered breast cancer survivor.

## 2018-10-19 NOTE — Patient Instructions (Signed)
Start calcium 1200 mg with vitamin D 800 units for bone health.  Start exercising. You should be getting 150 minutes of exercise weekly.  It's important to improve your diet by reducing consumption of fast food, fried food, processed snack foods, sugary drinks. Increase consumption of fresh vegetables and fruits, whole grains, water.  Ensure you are drinking 64 ounces of water daily.  Take the shingles vaccination to your pharmacy.   We will see you next year or sooner if needed.  It was a pleasure to see you today!   Preventive Care 3 Years and Older, Female Preventive care refers to lifestyle choices and visits with your health care provider that can promote health and wellness. What does preventive care include?  A yearly physical exam. This is also called an annual well check.  Dental exams once or twice a year.  Routine eye exams. Ask your health care provider how often you should have your eyes checked.  Personal lifestyle choices, including: ? Daily care of your teeth and gums. ? Regular physical activity. ? Eating a healthy diet. ? Avoiding tobacco and drug use. ? Limiting alcohol use. ? Practicing safe sex. ? Taking low-dose aspirin every day. ? Taking vitamin and mineral supplements as recommended by your health care provider. What happens during an annual well check? The services and screenings done by your health care provider during your annual well check will depend on your age, overall health, lifestyle risk factors, and family history of disease. Counseling Your health care provider may ask you questions about your:  Alcohol use.  Tobacco use.  Drug use.  Emotional well-being.  Home and relationship well-being.  Sexual activity.  Eating habits.  History of falls.  Memory and ability to understand (cognition).  Work and work Statistician.  Reproductive health.  Screening You may have the following tests or measurements:  Height, weight,  and BMI.  Blood pressure.  Lipid and cholesterol levels. These may be checked every 5 years, or more frequently if you are over 36 years old.  Skin check.  Lung cancer screening. You may have this screening every year starting at age 34 if you have a 30-pack-year history of smoking and currently smoke or have quit within the past 15 years.  Colorectal cancer screening. All adults should have this screening starting at age 42 and continuing until age 59. You will have tests every 1-10 years, depending on your results and the type of screening test. People at increased risk should start screening at an earlier age. Screening tests may include: ? Guaiac-based fecal occult blood testing. ? Fecal immunochemical test (FIT). ? Stool DNA test. ? Virtual colonoscopy. ? Sigmoidoscopy. During this test, a flexible tube with a tiny camera (sigmoidoscope) is used to examine your rectum and lower colon. The sigmoidoscope is inserted through your anus into your rectum and lower colon. ? Colonoscopy. During this test, a long, thin, flexible tube with a tiny camera (colonoscope) is used to examine your entire colon and rectum.  Hepatitis C blood test.  Hepatitis B blood test.  Sexually transmitted disease (STD) testing.  Diabetes screening. This is done by checking your blood sugar (glucose) after you have not eaten for a while (fasting). You may have this done every 1-3 years.  Bone density scan. This is done to screen for osteoporosis. You may have this done starting at age 65.  Mammogram. This may be done every 1-2 years. Talk to your health care provider about how often you should  have regular mammograms. Talk with your health care provider about your test results, treatment options, and if necessary, the need for more tests. Vaccines Your health care provider may recommend certain vaccines, such as:  Influenza vaccine. This is recommended every year.  Tetanus, diphtheria, and acellular  pertussis (Tdap, Td) vaccine. You may need a Td booster every 10 years.  Varicella vaccine. You may need this if you have not been vaccinated.  Zoster vaccine. You may need this after age 81.  Measles, mumps, and rubella (MMR) vaccine. You may need at least one dose of MMR if you were born in 1957 or later. You may also need a second dose.  Pneumococcal 13-valent conjugate (PCV13) vaccine. One dose is recommended after age 24.  Pneumococcal polysaccharide (PPSV23) vaccine. One dose is recommended after age 57.  Meningococcal vaccine. You may need this if you have certain conditions.  Hepatitis A vaccine. You may need this if you have certain conditions or if you travel or work in places where you may be exposed to hepatitis A.  Hepatitis B vaccine. You may need this if you have certain conditions or if you travel or work in places where you may be exposed to hepatitis B.  Haemophilus influenzae type b (Hib) vaccine. You may need this if you have certain conditions. Talk to your health care provider about which screenings and vaccines you need and how often you need them. This information is not intended to replace advice given to you by your health care provider. Make sure you discuss any questions you have with your health care provider. Document Released: 08/28/2015 Document Revised: 09/21/2017 Document Reviewed: 06/02/2015 Elsevier Interactive Patient Education  2019 [employer].

## 2018-10-19 NOTE — Assessment & Plan Note (Signed)
Doing well on duloxetine 60 mg, continue same. Continue with therapy. Denies Si/HI.

## 2018-10-19 NOTE — Assessment & Plan Note (Signed)
Improved with gabapentin, continue same. Continue with therapy as scheduled.

## 2018-10-19 NOTE — Assessment & Plan Note (Signed)
Following with pulmonology, continue CPAP machine.

## 2018-10-19 NOTE — Assessment & Plan Note (Signed)
Immunizations UTD. Rx provided for Shingles vaccination. Mammogram UTD. Colonoscopy UTD. Declines bone density scan, discussed to start calcium and vitamin D. Encouraged regular exercise and healthy diet. Exam unremarkable. Labs reviewed. Follow up in 1 year for CPE.

## 2018-10-22 ENCOUNTER — Other Ambulatory Visit: Payer: Self-pay | Admitting: Primary Care

## 2018-10-22 DIAGNOSIS — F419 Anxiety disorder, unspecified: Secondary | ICD-10-CM

## 2018-10-22 DIAGNOSIS — F324 Major depressive disorder, single episode, in partial remission: Secondary | ICD-10-CM

## 2018-10-24 ENCOUNTER — Other Ambulatory Visit: Payer: Self-pay | Admitting: Cardiology

## 2018-10-30 ENCOUNTER — Other Ambulatory Visit: Payer: Self-pay

## 2018-10-30 DIAGNOSIS — M62838 Other muscle spasm: Secondary | ICD-10-CM

## 2018-10-30 NOTE — Telephone Encounter (Signed)
Pt left v/m requesting refill of tizanidine to [facility]. Pt no longer sees the dr that previously prescribed. Pt had back pain last night with no muscle relaxer to take. Pt request to be filled today. Last refilled # [phone] x 5 by Dr Reginia Forts. Pt last seen 10/19/18 for annual.

## 2018-10-31 MED ORDER — TIZANIDINE HCL 4 MG PO TABS: 4.0000 mg | ORAL_TABLET | Freq: Two times a day (BID) | ORAL | 0 refills | Status: DC | PRN

## 2018-10-31 NOTE — Telephone Encounter (Signed)
She is taking 2 tablet a day.   Taking 1 tablet at supper then 3-4 hours later she take 1 tablet at bedtime

## 2018-10-31 NOTE — Telephone Encounter (Signed)
Pt left v/m requesting cb about refill of tizanidine.pt has not slept last 2 nights due to not having tizanidine.Please advise.

## 2018-10-31 NOTE — Telephone Encounter (Signed)
Noted, refill sent to pharmacy. 

## 2018-11-11 DIAGNOSIS — G4733 Obstructive sleep apnea (adult) (pediatric): Secondary | ICD-10-CM | POA: Diagnosis not present

## 2018-11-11 DIAGNOSIS — I10 Essential (primary) hypertension: Secondary | ICD-10-CM | POA: Diagnosis not present

## 2018-11-13 DIAGNOSIS — F4312 Post-traumatic stress disorder, chronic: Secondary | ICD-10-CM | POA: Diagnosis not present

## 2018-11-13 DIAGNOSIS — F331 Major depressive disorder, recurrent, moderate: Secondary | ICD-10-CM | POA: Diagnosis not present

## 2018-11-27 DIAGNOSIS — C44519 Basal cell carcinoma of skin of other part of trunk: Secondary | ICD-10-CM | POA: Diagnosis not present

## 2018-11-27 DIAGNOSIS — F331 Major depressive disorder, recurrent, moderate: Secondary | ICD-10-CM | POA: Diagnosis not present

## 2018-11-27 DIAGNOSIS — F4312 Post-traumatic stress disorder, chronic: Secondary | ICD-10-CM | POA: Diagnosis not present

## 2018-12-12 DIAGNOSIS — I10 Essential (primary) hypertension: Secondary | ICD-10-CM | POA: Diagnosis not present

## 2018-12-12 DIAGNOSIS — G4733 Obstructive sleep apnea (adult) (pediatric): Secondary | ICD-10-CM | POA: Diagnosis not present

## 2018-12-18 ENCOUNTER — Other Ambulatory Visit: Payer: Self-pay | Admitting: Primary Care

## 2018-12-18 DIAGNOSIS — F324 Major depressive disorder, single episode, in partial remission: Secondary | ICD-10-CM

## 2018-12-18 DIAGNOSIS — F4312 Post-traumatic stress disorder, chronic: Secondary | ICD-10-CM | POA: Diagnosis not present

## 2018-12-18 DIAGNOSIS — F330 Major depressive disorder, recurrent, mild: Secondary | ICD-10-CM | POA: Diagnosis not present

## 2018-12-18 DIAGNOSIS — F419 Anxiety disorder, unspecified: Secondary | ICD-10-CM

## 2018-12-18 MED ORDER — POTASSIUM CHLORIDE CRYS ER 20 MEQ PO TBCR: 40.0000 meq | EXTENDED_RELEASE_TABLET | Freq: Two times a day (BID) | ORAL | 5 refills | Status: DC

## 2018-12-18 NOTE — Telephone Encounter (Signed)
Phamracy also requesting tiZANidine (ZANAFLEX) 4 MG tablet

## 2018-12-18 NOTE — Addendum Note (Signed)
Addended by: Derl Barrow on: 12/18/2018 02:58 PM   Modules accepted: Orders

## 2018-12-18 NOTE — Telephone Encounter (Signed)
Pt's medication was resent to pt's requested pharmacy. Rx was sent to the wrong pharmacy per pt. Confirmation received.

## 2018-12-19 ENCOUNTER — Other Ambulatory Visit: Payer: Self-pay | Admitting: Primary Care

## 2018-12-19 DIAGNOSIS — M62838 Other muscle spasm: Secondary | ICD-10-CM

## 2018-12-19 NOTE — Telephone Encounter (Signed)
[phone]

## 2018-12-25 DIAGNOSIS — D225 Melanocytic nevi of trunk: Secondary | ICD-10-CM | POA: Diagnosis not present

## 2018-12-25 DIAGNOSIS — D2272 Melanocytic nevi of left lower limb, including hip: Secondary | ICD-10-CM | POA: Diagnosis not present

## 2018-12-25 DIAGNOSIS — D692 Other nonthrombocytopenic purpura: Secondary | ICD-10-CM | POA: Diagnosis not present

## 2018-12-25 DIAGNOSIS — L219 Seborrheic dermatitis, unspecified: Secondary | ICD-10-CM | POA: Diagnosis not present

## 2018-12-25 DIAGNOSIS — L821 Other seborrheic keratosis: Secondary | ICD-10-CM | POA: Diagnosis not present

## 2018-12-25 DIAGNOSIS — L720 Epidermal cyst: Secondary | ICD-10-CM | POA: Diagnosis not present

## 2018-12-25 DIAGNOSIS — D1800 Hemangioma unspecified site: Secondary | ICD-10-CM | POA: Diagnosis not present

## 2018-12-25 DIAGNOSIS — Z86018 Personal history of other benign neoplasm: Secondary | ICD-10-CM

## 2018-12-25 DIAGNOSIS — L820 Inflamed seborrheic keratosis: Secondary | ICD-10-CM | POA: Diagnosis not present

## 2018-12-25 DIAGNOSIS — D485 Neoplasm of uncertain behavior of skin: Secondary | ICD-10-CM | POA: Diagnosis not present

## 2018-12-25 DIAGNOSIS — Z1283 Encounter for screening for malignant neoplasm of skin: Secondary | ICD-10-CM | POA: Diagnosis not present

## 2018-12-25 HISTORY — DX: Personal history of other benign neoplasm: Z86.018

## 2019-01-03 ENCOUNTER — Telehealth: Payer: Self-pay | Admitting: Primary Care

## 2019-01-03 DIAGNOSIS — F324 Major depressive disorder, single episode, in partial remission: Secondary | ICD-10-CM

## 2019-01-03 DIAGNOSIS — F419 Anxiety disorder, unspecified: Secondary | ICD-10-CM

## 2019-01-11 DIAGNOSIS — G4733 Obstructive sleep apnea (adult) (pediatric): Secondary | ICD-10-CM | POA: Diagnosis not present

## 2019-01-11 DIAGNOSIS — I10 Essential (primary) hypertension: Secondary | ICD-10-CM | POA: Diagnosis not present

## 2019-01-14 DIAGNOSIS — F330 Major depressive disorder, recurrent, mild: Secondary | ICD-10-CM | POA: Diagnosis not present

## 2019-01-14 DIAGNOSIS — F4312 Post-traumatic stress disorder, chronic: Secondary | ICD-10-CM | POA: Diagnosis not present

## 2019-01-22 ENCOUNTER — Other Ambulatory Visit: Payer: Self-pay | Admitting: Primary Care

## 2019-01-22 DIAGNOSIS — M62838 Other muscle spasm: Secondary | ICD-10-CM

## 2019-01-23 NOTE — Telephone Encounter (Signed)
Patient has not received this prescription. She stated that she is almost out of medication    [facility] [place]

## 2019-01-23 NOTE — Telephone Encounter (Signed)
Please apologize to patient and explain that we didn't receive the refill request from her pharmacy. I will refill and send, which pharmacy? Mail order? Local?

## 2019-01-24 ENCOUNTER — Telehealth: Payer: Self-pay | Admitting: Primary Care

## 2019-01-24 ENCOUNTER — Encounter: Payer: Self-pay | Admitting: *Deleted

## 2019-01-24 DIAGNOSIS — F324 Major depressive disorder, single episode, in partial remission: Secondary | ICD-10-CM

## 2019-01-24 DIAGNOSIS — F419 Anxiety disorder, unspecified: Secondary | ICD-10-CM

## 2019-01-24 MED ORDER — GABAPENTIN 100 MG PO CAPS: 100.0000 mg | ORAL_CAPSULE | Freq: Three times a day (TID) | ORAL | 3 refills | Status: DC

## 2019-01-24 NOTE — Telephone Encounter (Signed)
Spoken and notified patient of Kate Clark's comments. Patient verbalized understanding.  

## 2019-01-24 NOTE — Telephone Encounter (Signed)
Best number [phone] Pt called checking on her gabapentin [facility]

## 2019-01-24 NOTE — Telephone Encounter (Signed)
Spoken to patient and resend the Rx as requested.

## 2019-01-31 ENCOUNTER — Telehealth: Payer: Self-pay | Admitting: Cardiology

## 2019-01-31 NOTE — Telephone Encounter (Signed)
New message   Patient is returning your call about rescheduling appt. Please call.

## 2019-02-01 NOTE — Telephone Encounter (Signed)
Spoke with pt, Follow up rescheduled

## 2019-02-04 ENCOUNTER — Other Ambulatory Visit: Payer: Self-pay

## 2019-02-04 DIAGNOSIS — F4312 Post-traumatic stress disorder, chronic: Secondary | ICD-10-CM | POA: Diagnosis not present

## 2019-02-04 DIAGNOSIS — F330 Major depressive disorder, recurrent, mild: Secondary | ICD-10-CM | POA: Diagnosis not present

## 2019-02-04 MED ORDER — SOTALOL HCL 120 MG PO TABS: 120.0000 mg | ORAL_TABLET | Freq: Two times a day (BID) | ORAL | 0 refills | Status: DC

## 2019-02-04 NOTE — Telephone Encounter (Signed)
Pt left v/m that flonase refills have expired.last refilled Dr Reginia Forts #[id number] x 11 on 10/11/17.Please advise.[facility]

## 2019-02-05 MED ORDER — FLUTICASONE PROPIONATE 50 MCG/ACT NA SUSP: 2.0000 | Freq: Every day | NASAL | 5 refills | Status: DC | PRN

## 2019-02-05 NOTE — Telephone Encounter (Signed)
Refill sent as requested. 

## 2019-02-06 ENCOUNTER — Ambulatory Visit: Payer: PPO | Admitting: Cardiology

## 2019-02-11 DIAGNOSIS — I10 Essential (primary) hypertension: Secondary | ICD-10-CM | POA: Diagnosis not present

## 2019-02-11 DIAGNOSIS — G4733 Obstructive sleep apnea (adult) (pediatric): Secondary | ICD-10-CM | POA: Diagnosis not present

## 2019-02-15 ENCOUNTER — Other Ambulatory Visit: Payer: Self-pay | Admitting: Primary Care

## 2019-02-15 DIAGNOSIS — F419 Anxiety disorder, unspecified: Secondary | ICD-10-CM

## 2019-02-15 DIAGNOSIS — F324 Major depressive disorder, single episode, in partial remission: Secondary | ICD-10-CM

## 2019-02-27 ENCOUNTER — Other Ambulatory Visit: Payer: Self-pay

## 2019-02-27 MED ORDER — FUROSEMIDE 40 MG PO TABS: 40.0000 mg | ORAL_TABLET | Freq: Every day | ORAL | 11 refills | Status: DC

## 2019-03-01 ENCOUNTER — Other Ambulatory Visit: Payer: Self-pay | Admitting: Cardiology

## 2019-03-01 MED ORDER — SOTALOL HCL 120 MG PO TABS: 120.0000 mg | ORAL_TABLET | Freq: Two times a day (BID) | ORAL | 0 refills | Status: DC

## 2019-03-01 MED ORDER — DILTIAZEM HCL ER COATED BEADS 180 MG PO CP24: 180.0000 mg | ORAL_CAPSULE | Freq: Every day | ORAL | 2 refills | Status: DC

## 2019-03-01 MED ORDER — METOPROLOL TARTRATE 25 MG PO TABS: ORAL_TABLET | ORAL | 0 refills | Status: DC

## 2019-03-03 ENCOUNTER — Other Ambulatory Visit: Payer: Self-pay | Admitting: Cardiology

## 2019-03-04 DIAGNOSIS — F330 Major depressive disorder, recurrent, mild: Secondary | ICD-10-CM | POA: Diagnosis not present

## 2019-03-04 DIAGNOSIS — F4312 Post-traumatic stress disorder, chronic: Secondary | ICD-10-CM | POA: Diagnosis not present

## 2019-03-13 ENCOUNTER — Other Ambulatory Visit: Payer: Self-pay | Admitting: Cardiology

## 2019-03-13 DIAGNOSIS — G4733 Obstructive sleep apnea (adult) (pediatric): Secondary | ICD-10-CM | POA: Diagnosis not present

## 2019-03-13 DIAGNOSIS — I10 Essential (primary) hypertension: Secondary | ICD-10-CM | POA: Diagnosis not present

## 2019-03-13 NOTE — Progress Notes (Deleted)
HPI: Follow-up diastolic congestive heart failure, paroxysmal atrial fibrillation, pericardial effusion, hypertension and hyperlipidemia. Patient was admitted March 2019 with atypical chest pain and paroxysmal atrial fibrillation. She was treated with sotalol and Xarelto. However she returned with significant dyspnea and follow-up echocardiogram showed large pericardial effusion with tamponade physiology.She had pericardiocentesis on April 1. Cytology was negative. At that time we felt it was likely her initial presentation was pericarditis and she had a hemorrhagic pericardial effusion from initiation of anticoagulation. We felt her atrial fibrillation may improve once pericardial process resolved. Echocardiogram October 2019 showed normal LV function, mildly dilated ascending aorta.  Since last seen   Current Outpatient Medications  Medication Sig Dispense Refill  . albuterol (PROVENTIL HFA;VENTOLIN HFA) 108 (90 Base) MCG/ACT inhaler Inhale 2 puffs into the lungs every 4 (four) hours as needed for wheezing or shortness of breath. 1 Inhaler 0  . benzonatate (TESSALON) 200 MG capsule Take 1 capsule (200 mg total) by mouth 3 (three) times daily as needed for cough. 15 capsule 0  . diltiazem (CARDIZEM CD) 180 MG 24 hr capsule TAKE 1 CAPSULE BY MOUTH DAILY. 30 capsule 1  . DULoxetine (CYMBALTA) 60 MG capsule TAKE 1 CAPSULE BY MOUTH 2 TIMES A DAY 180 capsule 1  . fluticasone (FLONASE) 50 MCG/ACT nasal spray Place 2 sprays into both nostrils daily as needed for rhinitis or allergies. 16 g 5  . furosemide (LASIX) 40 MG tablet Take 1 tablet (40 mg total) by mouth daily. 30 tablet 11  . gabapentin (NEURONTIN) 100 MG capsule Take 1 capsule (100 mg total) by mouth 3 (three) times daily. 90 capsule 3  . Melatonin 5 MG TABS Take by mouth.    . metoprolol tartrate (LOPRESSOR) 25 MG tablet TAKE 3 TABLETS (75 MG) BY MOUTH 2 (TWO) TIMES DAILY. PLEASE SCHEDULE APPT FOR REFILLS 180 tablet 0  .  potassium chloride SA (KLOR-CON M20) 20 MEQ tablet Take 2 tablets (40 mEq total) by mouth 2 (two) times daily. 120 tablet 5  . sotalol (BETAPACE) 120 MG tablet TAKE 1 TABLET BY MOUTH EVERY 12 HOURS 60 tablet 0  . tiZANidine (ZANAFLEX) 4 MG tablet TAKE 1 TABLET (4 MG TOTAL) BY MOUTH 2 (TWO) TIMES DAILY AS NEEDED FOR MUSCLE SPASMS. 180 tablet 0   No current facility-administered medications for this visit.      Past Medical History:  Diagnosis Date  . Anxiety   . Atrial fibrillation ([place])   . Chronic diastolic CHF (congestive heart failure) ([place])    a. echo 09/2014: EF 96-78%, diastolic dysfunction, mild LVH, nl RV size & systolic function, mildly dilated LA (4.3 cm), mild MR/TR, mildly elevated PASP 36.7 mm Hg  . DDD (degenerative disc disease), cervical   . DDD (degenerative disc disease), lumbar   . Depression   . Diffuse cystic mastopathy 2014  . Dyspnea   . GERD (gastroesophageal reflux disease)   . Gout   . Headache    rare  . HLD (hyperlipidemia)    a. statin intolerant 2/2 myalgias  . Hypercholesterolemia   . Hypertension   . Malignant neoplasm of upper-outer quadrant of female breast ([place]) 10/2012   Papillary DCIS, sentinel node negative. DR/PR positive. PARTIAL RIGHT MASTECTOMY FOR BREAST CANCER--HAD RADIATION - NO CHEMO --DR. [place] ONCOLOGIST  . Obesity   . OSA on CPAP   . Osteoarthritis of both knees    a. s/p right TKA 04/2013 & left TKA 09/2014  . Otitis externa   .  Personal history of radiation therapy 2015   RIGHT breast-mammosite per pt  . Sleep difficulties    LUNESTA HAS HELPED  . Vaginal cyst     Past Surgical History:  Procedure Laterality Date  . ABDOMINAL HYSTERECTOMY  1992   DUB; fibroids; endometriosis.  One remaining ovary.    [name] BREAST LUMPECTOMY Right 2015   Papillary DCIS, sentinel node negative. DR/PR positive. PARTIAL RIGHT MASTECTOMY FOR BREAST CANCER--HAD RADIATION - NO CHEMO --DR. [place] ONCOLOGIST  . BREAST SURGERY  Right March 2014   Wide excision,APB RT 10 mm papillary DCIS, ER/PR positive. Sentinel node negative. Partial breast radiation.  [name] CATARACT EXTRACTION, BILATERAL  02/13/2016   [place].  . CHOLECYSTECTOMY  1994  . COLONOSCOPY  2015   1 benign polyp-every 5 years/ Dr Candace Cruise  . ERCP  1995  . JOINT REPLACEMENT Right Sept 2014   knee  . PERICARDIOCENTESIS N/A 11/13/2017   Procedure: PERICARDIOCENTESIS;  Surgeon: Nelva Bush, MD;  Location: [facility];  Service: Cardiovascular;  Laterality: N/A;  . TOTAL KNEE ARTHROPLASTY Right 04/16/2013   Procedure: RIGHT TOTAL KNEE ARTHROPLASTY;  Surgeon: Mauri Pole, MD;  Location: [facility];  Service: Orthopedics;  Laterality: Right;  . TOTAL KNEE ARTHROPLASTY Left 09/15/2014   Procedure: LEFT TOTAL KNEE ARTHROPLASTY;  Surgeon: Mauri Pole, MD;  Location: [facility];  Service: Orthopedics;  Laterality: Left;  . TUBAL LIGATION  1979    Social History   Socioeconomic History  . Marital status: Widowed    Spouse name: Chrissie Noa  . Number of children: 2  . Years of education: College  . Highest education level: Bachelor's degree (e.g., BA, AB, BS)  Occupational History  . Occupation: Retired  Scientific laboratory technician  . Financial resource strain: Not hard at all  . Food insecurity    Worry: Never true    Inability: Never true  . Transportation needs    Medical: No    Non-medical: No  Tobacco Use  . Smoking status: Never Smoker  . Smokeless tobacco: Never Used  . Tobacco comment: social smoker as a teen  Substance and Sexual Activity  . Alcohol use: No  . Drug use: No  . Sexual activity: Not Currently    Birth control/protection: Post-menopausal, Surgical  Lifestyle  . Physical activity    Days per week: 7 days    Minutes per session: 20 min  . Stress: Only a little  Relationships  . Social connections    Talks on phone: More than three times a week    Gets together: More than three times a week    Attends religious service: More than 4 times per  year    Active member of club or organization: Yes    Attends meetings of clubs or organizations: More than 4 times per year    Relationship status: Widowed  . Intimate partner violence    Fear of current or ex partner: No    Emotionally abused: No    Physically abused: No    Forced sexual activity: No  Other Topics Concern  . Not on file  Social History Narrative   Marital status: widowed since 09/16/2015      Children: 2 children (31, 54); 5 grandchild (4 in [place]; 1 in [place]).      Lives: alone in townhome; 1 dog, 2 cats      Employment: psychiatric Education officer, museum; retired in 2015      Tobacco: teenager only      Alcohol:  None      Drugs: none      Exercise: walking in 2019; more active in 2019.  Walking daily small amounts.        ADLs: drives; independent with ADLs; no assistant devices      Advanced Directives: none; FULL CODE; no prolonged measures.   Does not need them.  Daughter/Heather Vista Deck is HCPOA.     Family History  Problem Relation Age of Onset  . Colon cancer Mother   . Cancer Mother 58       colon cancer  . Melanoma Father   . Cancer Father 77       melanoma  . Colon cancer Maternal Grandfather   . Breast cancer Maternal Grandfather     ROS: no fevers or chills, productive cough, hemoptysis, dysphasia, odynophagia, melena, hematochezia, dysuria, hematuria, rash, seizure activity, orthopnea, PND, pedal edema, claudication. Remaining systems are negative.  Physical Exam: Well-developed well-nourished in no acute distress.  Skin is warm and dry.  HEENT is normal.  Neck is supple.  Chest is clear to auscultation with normal expansion.  Cardiovascular exam is regular rate and rhythm.  Abdominal exam nontender or distended. No masses palpated. Extremities show no edema. neuro grossly intact  ECG- personally reviewed  A/P  1 paroxysmal atrial fibrillation-we felt that previous atrial fibrillation was related to pericarditis.  She has not had recurrent  symptoms.  We elected not to anticoagulate previously as we were concerned as previous use of Xarelto had led to hemorrhagic pericardial effusion.  I will continue with metoprolol, Cardizem and sotalol.  2 chronic diastolic congestive heart failure-she is euvolemic on examination today.  Continue present dose of diuretic.  3 history of hemorrhagic pericardial effusion-most recent study showed resolution of pericardial effusion.  4 hypertension-patient's blood pressure is controlled.  Continue present medications and follow.  [name], MD

## 2019-03-14 ENCOUNTER — Encounter: Payer: Self-pay | Admitting: General Surgery

## 2019-03-14 ENCOUNTER — Ambulatory Visit: Payer: PPO | Admitting: Cardiology

## 2019-03-18 ENCOUNTER — Other Ambulatory Visit: Payer: Self-pay | Admitting: Primary Care

## 2019-03-18 DIAGNOSIS — M62838 Other muscle spasm: Secondary | ICD-10-CM

## 2019-03-20 ENCOUNTER — Telehealth: Payer: Self-pay | Admitting: Primary Care

## 2019-03-20 DIAGNOSIS — M62838 Other muscle spasm: Secondary | ICD-10-CM

## 2019-03-20 MED ORDER — TIZANIDINE HCL 4 MG PO TABS: ORAL_TABLET | ORAL | 1 refills | Status: DC

## 2019-03-20 NOTE — Telephone Encounter (Signed)
Noted. Sent as requested. Patient notified.

## 2019-03-20 NOTE — Telephone Encounter (Signed)
Patient called.  She needs a refill on Tizanidine.  [facility] said when they received the rx it said take prn.  [facility] can't package the medication as prn.  Patient said she takes 1 pill at supper and 1 pill at bedtime.  Patient is requesting a new rx be sent to [facility]. Patient said [facility] won't be able to fill the medication unless they receive the new rx very soon.

## 2019-03-22 ENCOUNTER — Telehealth: Payer: Self-pay

## 2019-03-22 ENCOUNTER — Other Ambulatory Visit: Payer: Self-pay | Admitting: Family Medicine

## 2019-03-22 DIAGNOSIS — F419 Anxiety disorder, unspecified: Secondary | ICD-10-CM

## 2019-03-22 DIAGNOSIS — F324 Major depressive disorder, single episode, in partial remission: Secondary | ICD-10-CM

## 2019-03-22 DIAGNOSIS — M62838 Other muscle spasm: Secondary | ICD-10-CM

## 2019-03-22 MED ORDER — DULOXETINE HCL 60 MG PO CPEP: ORAL_CAPSULE | ORAL | 1 refills | Status: DC

## 2019-03-22 MED ORDER — TIZANIDINE HCL 4 MG PO TABS: ORAL_TABLET | ORAL | 1 refills | Status: DC

## 2019-03-22 NOTE — Telephone Encounter (Signed)
Order sent with instructions to take with dinner and at bedtime.

## 2019-03-22 NOTE — Telephone Encounter (Addendum)
Fax from pharmacy came stating Tizanidine needs to be resent and include time of administration for this (can not just say 2 times daily) to be included for pre-packaged medications she gets. [facility] review[facility] [place] [facility]

## 2019-03-22 NOTE — Telephone Encounter (Signed)
Refill request for next fill for pre packed medications patient gets.

## 2019-03-25 ENCOUNTER — Ambulatory Visit (INDEPENDENT_AMBULATORY_CARE_PROVIDER_SITE_OTHER): Payer: PPO | Admitting: Family Medicine

## 2019-03-25 ENCOUNTER — Encounter: Payer: Self-pay | Admitting: Family Medicine

## 2019-03-25 ENCOUNTER — Other Ambulatory Visit: Payer: Self-pay

## 2019-03-25 DIAGNOSIS — W5501XA Bitten by cat, initial encounter: Secondary | ICD-10-CM

## 2019-03-25 DIAGNOSIS — S61451A Open bite of right hand, initial encounter: Secondary | ICD-10-CM

## 2019-03-25 HISTORY — DX: Bitten by cat, initial encounter: S61.451A

## 2019-03-25 HISTORY — DX: Bitten by cat, initial encounter: W55.01XA

## 2019-03-25 MED ORDER — DOXYCYCLINE HYCLATE 100 MG PO TABS: 100.0000 mg | ORAL_TABLET | Freq: Two times a day (BID) | ORAL | 0 refills | Status: DC

## 2019-03-25 NOTE — Progress Notes (Signed)
Subjective:    Patient ID: Selena Bush, female    DOB: 1946-03-10, 73 y.o.   MRN: [id number]  HPI Here for cat bite on R hand   5 d ago  Her cat snuggled in bed with her and she reached out to pet her  Bit her in R hand (long and sharp tooth) Thinks she bit her because it was hungry  It bled quite a bit  Wiped it off and used band aide with neosporin   It started to get more red and swollen  Stopped using band aid  Has cleaned with soap and water  Not very painful at all (a little tender to the touch)  No fever Feels fine   Cat is indoors/ and no concern re: imms (her own cat)    Last Td was 1/20    Up to date   Patient Active Problem List   Diagnosis Date Noted  . Cat bite of right hand 03/25/2019  . DDD (degenerative disc disease), lumbar 10/19/2018  . Preventative health care 10/19/2018  . Prediabetes 10/19/2018  . (HFpEF) heart failure with preserved ejection fraction ([place]) 12/12/2017  . Pericardial effusion with cardiac tamponade 11/13/2017  . Paroxysmal atrial fibrillation ([place]) 11/05/2017  . Anxiety 03/21/2016  . Insomnia 07/17/2015  . MI (mitral incompetence) 12/10/2014  . Major depressive disorder 12/09/2014  . OSA (obstructive sleep apnea) 12/09/2014  . GERD (gastroesophageal reflux disease) 12/09/2014  . Hyperglycemia 12/09/2014  . Chronic diastolic CHF (congestive heart failure) ([place])   . HLD (hyperlipidemia)   . Osteoarthritis of both knees   . S/P left TKA 08/19/2014  . 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 (BMI) 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 ([place]) 04/17/2013  . DCIS (ductal carcinoma in situ) 11/05/2012  . Essential hypertension, benign 11/05/2012   Past Medical History:  Diagnosis Date  . Anxiety   . Atrial fibrillation ([place])   . Chronic diastolic CHF (congestive heart failure) ([place])    a. echo 09/2014: EF 95-18%, diastolic dysfunction, mild LVH, nl RV size & systolic function, mildly dilated LA (4.3 cm), mild MR/TR, mildly elevated  PASP 36.7 mm Hg  . DDD (degenerative disc disease), cervical   . DDD (degenerative disc disease), lumbar   . Depression   . Diffuse cystic mastopathy 2014  . Dyspnea   . GERD (gastroesophageal reflux disease)   . Gout   . Headache    rare  . HLD (hyperlipidemia)    a. statin intolerant 2/2 myalgias  . Hypercholesterolemia   . Hypertension   . Malignant neoplasm of upper-outer quadrant of female breast ([place]) 10/2012   Papillary DCIS, sentinel node negative. DR/PR positive. PARTIAL RIGHT MASTECTOMY FOR BREAST CANCER--HAD RADIATION - NO CHEMO --DR. [place] ONCOLOGIST  . Obesity   . OSA on CPAP   . Osteoarthritis of both knees    a. s/p right TKA 04/2013 & left TKA 09/2014  . Otitis externa   . Personal history of radiation therapy 2015   RIGHT breast-mammosite per pt  . Sleep difficulties    LUNESTA HAS HELPED  . Vaginal cyst    Past Surgical History:  Procedure Laterality Date  . ABDOMINAL HYSTERECTOMY  1992   DUB; fibroids; endometriosis.  One remaining ovary.    [name] BREAST LUMPECTOMY Right 2015   Papillary DCIS, sentinel node negative. DR/PR positive. PARTIAL RIGHT MASTECTOMY FOR BREAST CANCER--HAD RADIATION - NO CHEMO --DR. [place] ONCOLOGIST  . BREAST SURGERY Right March 2014   Wide excision,APB  RT 10 mm papillary DCIS, ER/PR positive. Sentinel node negative. Partial breast radiation.  [name] CATARACT EXTRACTION, BILATERAL  02/13/2016   [facility].  . CHOLECYSTECTOMY  1994  . COLONOSCOPY  2015   1 benign polyp-every 5 years/ Dr Candace Cruise  . ERCP  1995  . JOINT REPLACEMENT Right Sept 2014   knee  . PERICARDIOCENTESIS N/A 11/13/2017   Procedure: PERICARDIOCENTESIS;  Surgeon: Nelva Bush, MD;  Location: [facility];  Service: Cardiovascular;  Laterality: N/A;  . TOTAL KNEE ARTHROPLASTY Right 04/16/2013   Procedure: RIGHT TOTAL KNEE ARTHROPLASTY;  Surgeon: Mauri Pole, MD;  Location: [facility];  Service: Orthopedics;  Laterality: Right;  . TOTAL KNEE ARTHROPLASTY  Left 09/15/2014   Procedure: LEFT TOTAL KNEE ARTHROPLASTY;  Surgeon: Mauri Pole, MD;  Location: [facility];  Service: Orthopedics;  Laterality: Left;  . TUBAL LIGATION  1979   Social History   Tobacco Use  . Smoking status: Never Smoker  . Smokeless tobacco: Never Used  . Tobacco comment: social smoker as a teen  Substance Use Topics  . Alcohol use: No  . Drug use: No   Family History  Problem Relation Age of Onset  . Colon cancer Mother   . Cancer Mother 51       colon cancer  . Melanoma Father   . Cancer Father 13       melanoma  . Colon cancer Maternal Grandfather   . Breast cancer Maternal Grandfather    Allergies  Allergen Reactions  . Penicillins Anaphylaxis    anaphylaxis  Has patient had a PCN reaction causing immediate rash, facial/tongue/throat swelling, SOB or lightheadedness with hypotension: Yes Has patient had a PCN reaction causing severe rash involving mucus membranes or skin necrosis: No Has patient had a PCN reaction that required hospitalization: No Has patient had a PCN reaction occurring within the last 10 years: No If all of the above answers are "NO", then may proceed with Cephalosporin use.   . Sulfa Antibiotics Anaphylaxis  . Codeine Other (See Comments) and Nausea And Vomiting    Gi problems   . Statins Other (See Comments)    Leg pains  . Aspirin Other (See Comments)    "burned my stomach intensely" Abdominal pain and burning   Current Outpatient Medications on File Prior to Visit  Medication Sig Dispense Refill  . diltiazem (CARDIZEM CD) 180 MG 24 hr capsule TAKE 1 CAPSULE BY MOUTH DAILY. 30 capsule 1  . DULoxetine (CYMBALTA) 60 MG capsule TAKE 1 CAPSULE BY MOUTH 2 TIMES A DAY 180 capsule 1  . fluticasone (FLONASE) 50 MCG/ACT nasal spray Place 2 sprays into both nostrils daily as needed for rhinitis or allergies. 16 g 5  . furosemide (LASIX) 40 MG tablet Take 1 tablet (40 mg total) by mouth daily. 30 tablet 11  . gabapentin (NEURONTIN) 100  MG capsule Take 1 capsule (100 mg total) by mouth 3 (three) times daily. 90 capsule 3  . metoprolol tartrate (LOPRESSOR) 25 MG tablet TAKE 3 TABLETS (75 MG) BY MOUTH 2 (TWO) TIMES DAILY. 180 tablet 0  . potassium chloride SA (KLOR-CON M20) 20 MEQ tablet Take 2 tablets (40 mEq total) by mouth 2 (two) times daily. 120 tablet 5  . sotalol (BETAPACE) 120 MG tablet TAKE 1 TABLET BY MOUTH EVERY 12 HOURS 60 tablet 0  . tiZANidine (ZANAFLEX) 4 MG tablet TAKE 1 TABLET (4 MG TOTAL) BY MOUTH 2 (TWO) TIMES DAILY FOR MUSCLE SPASMS.Take one with dinner and one at  bedtime. 180 tablet 1   No current facility-administered medications on file prior to visit.     Review of Systems  Constitutional: Negative for activity change, appetite change, fatigue, fever and unexpected weight change.  HENT: Negative for congestion, ear pain, rhinorrhea, sinus pressure and sore throat.   Eyes: Negative for pain, redness and visual disturbance.  Respiratory: Negative for cough, shortness of breath and wheezing.   Cardiovascular: Negative for chest pain and palpitations.  Gastrointestinal: Negative for abdominal pain, blood in stool, constipation and diarrhea.  Endocrine: Negative for polydipsia and polyuria.  Genitourinary: Negative for dysuria, frequency and urgency.  Musculoskeletal: Negative for arthralgias, back pain and myalgias.  Skin: Positive for wound. Negative for pallor and rash.  Allergic/Immunologic: Negative for environmental allergies.  Neurological: Negative for dizziness, syncope, weakness, numbness and headaches.  Hematological: Negative for adenopathy. Does not bruise/bleed easily.  Psychiatric/Behavioral: Negative for decreased concentration and dysphoric mood. The patient is not nervous/anxious.        Objective:   Physical Exam Constitutional:      Appearance: Normal appearance. She is obese. She is not ill-appearing or diaphoretic.  HENT:     Head: Normocephalic and atraumatic.  Eyes:      General:        Right eye: No discharge.        Left eye: No discharge.     Extraocular Movements: Extraocular movements intact.     Conjunctiva/sclera: Conjunctivae normal.     Pupils: Pupils are equal, round, and reactive to light.  Neck:     Musculoskeletal: Normal range of motion.  Cardiovascular:     Rate and Rhythm: Bradycardia present.  Pulmonary:     Effort: Pulmonary effort is normal. No respiratory distress.  Lymphadenopathy:     Cervical: No cervical adenopathy.  Skin:    General: Skin is warm and dry.     Findings: Erythema present.     Comments: Quarter sized area of erythema and induration in snuff box area of R hand with scab in middle Not fluctuant or draining Slightly tender if any Nl sens/perf and rom of hand w.o pain   Neurological:     Mental Status: She is alert.     Sensory: No sensory deficit.  Psychiatric:        Mood and Affect: Mood normal.           Assessment & Plan:   Problem List Items Addressed This Visit      Other   Cat bite of right hand    From her own cat/no worried re: rabies/immunized Provoked bite Now red and swollen- worrisome for early infection  Cover with doxycycline (she is pcn allergic)  Soap and water cleanse at least tid  Cover lightly if needed Inst to watch for inc pain /redness/swelling or streaking Also if neuro change Update if not starting to improve in a week or if worsening   Disc prev of cat bites in the future

## 2019-03-25 NOTE — Patient Instructions (Addendum)
Keep wound /bite site very very clean with soap and water several times per day  Cover only if you want to  Take the doxycline as directed twice daily   Watch for increased red/swelling/pain or streaks of redness  Let us know   If not improving in a week let us know   It throbbing-elevate it

## 2019-03-25 NOTE — Assessment & Plan Note (Signed)
From her own cat/no worried re: rabies/immunized Provoked bite Now red and swollen- worrisome for early infection  Cover with doxycycline (she is pcn allergic)  Soap and water cleanse at least tid  Cover lightly if needed Inst to watch for inc pain /redness/swelling or streaking Also if neuro change Update if not starting to improve in a week or if worsening   Disc prev of cat bites in the future

## 2019-04-01 DIAGNOSIS — F330 Major depressive disorder, recurrent, mild: Secondary | ICD-10-CM | POA: Diagnosis not present

## 2019-04-01 DIAGNOSIS — F4312 Post-traumatic stress disorder, chronic: Secondary | ICD-10-CM | POA: Diagnosis not present

## 2019-04-13 DIAGNOSIS — G4733 Obstructive sleep apnea (adult) (pediatric): Secondary | ICD-10-CM | POA: Diagnosis not present

## 2019-04-13 DIAGNOSIS — I10 Essential (primary) hypertension: Secondary | ICD-10-CM | POA: Diagnosis not present

## 2019-04-17 ENCOUNTER — Other Ambulatory Visit: Payer: Self-pay | Admitting: Cardiology

## 2019-04-17 ENCOUNTER — Other Ambulatory Visit: Payer: Self-pay | Admitting: Primary Care

## 2019-04-17 DIAGNOSIS — F419 Anxiety disorder, unspecified: Secondary | ICD-10-CM

## 2019-04-17 DIAGNOSIS — M62838 Other muscle spasm: Secondary | ICD-10-CM

## 2019-04-17 DIAGNOSIS — F324 Major depressive disorder, single episode, in partial remission: Secondary | ICD-10-CM

## 2019-04-29 ENCOUNTER — Telehealth: Payer: Self-pay | Admitting: Primary Care

## 2019-04-29 DIAGNOSIS — M62838 Other muscle spasm: Secondary | ICD-10-CM

## 2019-04-29 MED ORDER — TIZANIDINE HCL 4 MG PO TABS: ORAL_TABLET | ORAL | 1 refills | Status: DC

## 2019-04-29 NOTE — Telephone Encounter (Signed)
Patient called.  Patient uses [facility]. They need a new rx for Tizanidine HCL stating to use pm instead of am.

## 2019-04-29 NOTE — Telephone Encounter (Signed)
Spoken to patient and corrected the direction for patient.

## 2019-05-03 ENCOUNTER — Other Ambulatory Visit: Payer: Self-pay | Admitting: Cardiology

## 2019-05-03 ENCOUNTER — Other Ambulatory Visit: Payer: Self-pay | Admitting: Primary Care

## 2019-05-03 DIAGNOSIS — F419 Anxiety disorder, unspecified: Secondary | ICD-10-CM

## 2019-05-03 DIAGNOSIS — F324 Major depressive disorder, single episode, in partial remission: Secondary | ICD-10-CM

## 2019-05-06 DIAGNOSIS — F4312 Post-traumatic stress disorder, chronic: Secondary | ICD-10-CM | POA: Diagnosis not present

## 2019-05-06 DIAGNOSIS — F330 Major depressive disorder, recurrent, mild: Secondary | ICD-10-CM | POA: Diagnosis not present

## 2019-05-14 ENCOUNTER — Other Ambulatory Visit: Payer: Self-pay | Admitting: Primary Care

## 2019-05-14 DIAGNOSIS — G4733 Obstructive sleep apnea (adult) (pediatric): Secondary | ICD-10-CM | POA: Diagnosis not present

## 2019-05-14 DIAGNOSIS — F419 Anxiety disorder, unspecified: Secondary | ICD-10-CM

## 2019-05-14 DIAGNOSIS — I10 Essential (primary) hypertension: Secondary | ICD-10-CM | POA: Diagnosis not present

## 2019-05-14 DIAGNOSIS — F324 Major depressive disorder, single episode, in partial remission: Secondary | ICD-10-CM

## 2019-06-03 DIAGNOSIS — F4312 Post-traumatic stress disorder, chronic: Secondary | ICD-10-CM | POA: Diagnosis not present

## 2019-06-03 DIAGNOSIS — F330 Major depressive disorder, recurrent, mild: Secondary | ICD-10-CM | POA: Diagnosis not present

## 2019-06-11 ENCOUNTER — Ambulatory Visit (INDEPENDENT_AMBULATORY_CARE_PROVIDER_SITE_OTHER): Payer: PPO

## 2019-06-11 DIAGNOSIS — Z23 Encounter for immunization: Secondary | ICD-10-CM

## 2019-06-13 DIAGNOSIS — G4733 Obstructive sleep apnea (adult) (pediatric): Secondary | ICD-10-CM | POA: Diagnosis not present

## 2019-06-13 DIAGNOSIS — I10 Essential (primary) hypertension: Secondary | ICD-10-CM | POA: Diagnosis not present

## 2019-06-14 ENCOUNTER — Other Ambulatory Visit: Payer: Self-pay | Admitting: Cardiology

## 2019-06-14 NOTE — Progress Notes (Signed)
HPI: Follow-up diastolic congestive heart failure, paroxysmal atrial fibrillation, pericardial effusion, hypertension and hyperlipidemia. Patient was admitted March 2019 with atypical chest pain and paroxysmal atrial fibrillation. She was treated with sotalol and Xarelto. However she returned with significant dyspnea and follow-up echocardiogram showed large pericardial effusion with tamponade physiology.She had pericardiocentesis on April 1. Cytology was negative. At that time we felt it was likely her initial presentation was pericarditis and she had a hemorrhagic pericardial effusion from initiation of anticoagulation. We felt her atrial fibrillation may improve once pericardial process resolved. Patient was admitted with pneumonia and recurrent atrial fibrilllationto an outside hospital. Follow-up echocardiogram April 2019 showed normal LV function, moderate left ventricular hypertrophy, mild tricuspid regurgitation, small pericardial effusion and small pleural effusion.  Most recent echocardiogram October 2019 showed normal LV function, mildly dilated ascending aorta and no pericardial effusion.  Since last seen she denies dyspnea, chest pain, palpitations or syncope.  Current Outpatient Medications  Medication Sig Dispense Refill  . diltiazem (CARDIZEM CD) 180 MG 24 hr capsule TAKE 1 CAPSULE BY MOUTH DAILY. 30 capsule 1  . doxycycline (VIBRA-TABS) 100 MG tablet Take 1 tablet (100 mg total) by mouth 2 (two) times daily. 20 tablet 0  . DULoxetine (CYMBALTA) 60 MG capsule TAKE 1 CAPSULE BY MOUTH 2 TIMES A DAY 180 capsule 1  . fluticasone (FLONASE) 50 MCG/ACT nasal spray Place 2 sprays into both nostrils daily as needed for rhinitis or allergies. 16 g 5  . furosemide (LASIX) 40 MG tablet Take 1 tablet (40 mg total) by mouth daily. 30 tablet 11  . gabapentin (NEURONTIN) 100 MG capsule TAKE 1 CAPSULE BY MOUTH THREE TIMES A DAY 90 capsule 1  . metoprolol tartrate (LOPRESSOR) 25 MG tablet  TAKE 3 TABLETS BY MOUTH TWICE A DAY 180 tablet 1  . potassium chloride SA (KLOR-CON M20) 20 MEQ tablet Take 2 tablets (40 mEq total) by mouth 2 (two) times daily. 120 tablet 5  . sotalol (BETAPACE) 120 MG tablet TAKE 1 TABLET BY MOUTH EVERY 12 HOURS 180 tablet 0  . tiZANidine (ZANAFLEX) 4 MG tablet TAKE 2 TABLET (4 MG TOTAL) BY MOUTH DAILY FOR MUSCLE SPASMS AT BEDTIME 180 tablet 1   No current facility-administered medications for this visit.      Past Medical History:  Diagnosis Date  . Anxiety   . Atrial fibrillation ([place])   . Chronic diastolic CHF (congestive heart failure) ([place])    a. echo 09/2014: EF [id number], diastolic dysfunction, mild LVH, nl RV size & systolic function, mildly dilated LA (4.3 cm), mild MR/TR, mildly elevated PASP 36.7 mm Hg  . DDD (degenerative disc disease), cervical   . DDD (degenerative disc disease), lumbar   . Depression   . Diffuse cystic mastopathy 2014  . Dyspnea   . GERD (gastroesophageal reflux disease)   . Gout   . Headache    rare  . HLD (hyperlipidemia)    a. statin intolerant 2/2 myalgias  . Hypercholesterolemia   . Hypertension   . Malignant neoplasm of upper-outer quadrant of female breast ([place]) 10/2012   Papillary DCIS, sentinel node negative. DR/PR positive. PARTIAL RIGHT MASTECTOMY FOR BREAST CANCER--HAD RADIATION - NO CHEMO --DR. [place] ONCOLOGIST  . Obesity   . OSA on CPAP   . Osteoarthritis of both knees    a. s/p right TKA 04/2013 & left TKA 09/2014  . Otitis externa   . Personal history of radiation therapy 2015   RIGHT breast-mammosite per pt  .  Sleep difficulties    LUNESTA HAS HELPED  . Vaginal cyst     Past Surgical History:  Procedure Laterality Date  . ABDOMINAL HYSTERECTOMY  1992   DUB; fibroids; endometriosis.  One remaining ovary.    [name] BREAST LUMPECTOMY Right 2015   Papillary DCIS, sentinel node negative. DR/PR positive. PARTIAL RIGHT MASTECTOMY FOR BREAST CANCER--HAD RADIATION - NO CHEMO --DR. [place] ONCOLOGIST  . BREAST SURGERY Right March 2014   Wide excision,APB RT 10 mm papillary DCIS, ER/PR positive. Sentinel node negative. Partial breast radiation.  [name] CATARACT EXTRACTION, BILATERAL  02/13/2016   [facility].  . CHOLECYSTECTOMY  1994  . COLONOSCOPY  2015   1 benign polyp-every 5 years/ Dr Candace Cruise  . ERCP  1995  . JOINT REPLACEMENT Right Sept 2014   knee  . PERICARDIOCENTESIS N/A 11/13/2017   Procedure: PERICARDIOCENTESIS;  Surgeon: Nelva Bush, MD;  Location: [facility];  Service: Cardiovascular;  Laterality: N/A;  . TOTAL KNEE ARTHROPLASTY Right 04/16/2013   Procedure: RIGHT TOTAL KNEE ARTHROPLASTY;  Surgeon: Mauri Pole, MD;  Location: [facility];  Service: Orthopedics;  Laterality: Right;  . TOTAL KNEE ARTHROPLASTY Left 09/15/2014   Procedure: LEFT TOTAL KNEE ARTHROPLASTY;  Surgeon: Mauri Pole, MD;  Location: [facility];  Service: Orthopedics;  Laterality: Left;  . TUBAL LIGATION  1979    Social History   Socioeconomic History  . Marital status: Widowed    Spouse name: Chrissie Noa  . Number of children: 2  . Years of education: College  . Highest education level: Bachelor's degree (e.g., BA, AB, BS)  Occupational History  . Occupation: Retired  Scientific laboratory technician  . Financial resource strain: Not hard at all  . Food insecurity    Worry: Never true    Inability: Never true  . Transportation needs    Medical: No    Non-medical: No  Tobacco Use  . Smoking status: Never Smoker  . Smokeless tobacco: Never Used  . Tobacco comment: social smoker as a teen  Substance and Sexual Activity  . Alcohol use: No  . Drug use: No  . Sexual activity: Not Currently    Birth control/protection: Post-menopausal, Surgical  Lifestyle  . Physical activity    Days per week: 7 days    Minutes per session: 20 min  . Stress: Only a little  Relationships  . Social connections    Talks on phone: More than three times a week    Gets together: More than three times a week    Attends  religious service: More than 4 times per year    Active member of club or organization: Yes    Attends meetings of clubs or organizations: More than 4 times per year    Relationship status: Widowed  . Intimate partner violence    Fear of current or ex partner: No    Emotionally abused: No    Physically abused: No    Forced sexual activity: No  Other Topics Concern  . Not on file  Social History Narrative   Marital status: widowed since 09/16/2015      Children: 2 children (34, 67); 5 grandchild (4 in [place]; 1 in [place]).      Lives: alone in townhome; 1 dog, 2 cats      Employment: psychiatric Education officer, museum; retired in 2015      Tobacco: teenager only      Alcohol:  None      Drugs: none  Exercise: walking in 2019; more active in 2019.  Walking daily small amounts.        ADLs: drives; independent with ADLs; no assistant devices      Advanced Directives: none; FULL CODE; no prolonged measures.   Does not need them.  Daughter/Heather Vista Deck is HCPOA.     Family History  Problem Relation Age of Onset  . Colon cancer Mother   . Cancer Mother 50       colon cancer  . Melanoma Father   . Cancer Father 85       melanoma  . Colon cancer Maternal Grandfather   . Breast cancer Maternal Grandfather     ROS: no fevers or chills, productive cough, hemoptysis, dysphasia, odynophagia, melena, hematochezia, dysuria, hematuria, rash, seizure activity, orthopnea, PND, pedal edema, claudication. Remaining systems are negative.  Physical Exam: Well-developed well-nourished in no acute distress.  Skin is warm and dry.  HEENT is normal.  Neck is supple.  Chest is clear to auscultation with normal expansion.  Cardiovascular exam is regular rate and rhythm.  Abdominal exam nontender or distended. No masses palpated. Extremities show no edema. neuro grossly intact  ECG-sinus bradycardia at a rate of 58, inferolateral T wave inversion.  Personally reviewed  A/P  1 history of paroxysmal  atrial fibrillation-patient remains in sinus rhythm.  I feel that previous atrial fibrillation was likely related to pericarditis and pneumonia.  Initiation of Xarelto resulted in hemorrhagic pericardial effusion.  I therefore have not anticoagulated and she would not be willing at this point given history of hemorrhagic pericardial effusion.  She knows that there is a higher risk of stroke off of anticoagulation if atrial fibrillation recurs.  Given that there may have been a precipitating factor to her atrial fibrillation I have considered discontinuing sotalol.  However she would like to continue for now and we will readdress when she returns in 1 year.  Continue Cardizem and metoprolol.  2 history of hemorrhagic pericardial effusion-most recent echocardiogram showed no effusion.  3 chronic diastolic congestive heart failure-euvolemic on examination today.  Continue present dose of Lasix.  4 hypertension-blood pressure controlled.  Continue present medications and follow.  [name], MD

## 2019-06-17 ENCOUNTER — Other Ambulatory Visit: Payer: Self-pay

## 2019-06-17 ENCOUNTER — Encounter: Payer: Self-pay | Admitting: Cardiology

## 2019-06-17 ENCOUNTER — Ambulatory Visit: Payer: PPO | Admitting: Cardiology

## 2019-06-17 VITALS — BP 122/70 | HR 62 | Ht 62.0 in | Wt 206.6 lb

## 2019-06-17 DIAGNOSIS — I10 Essential (primary) hypertension: Secondary | ICD-10-CM | POA: Diagnosis not present

## 2019-06-17 DIAGNOSIS — I313 Pericardial effusion (noninflammatory): Secondary | ICD-10-CM | POA: Diagnosis not present

## 2019-06-17 DIAGNOSIS — I480 Paroxysmal atrial fibrillation: Secondary | ICD-10-CM | POA: Diagnosis not present

## 2019-06-17 DIAGNOSIS — I5032 Chronic diastolic (congestive) heart failure: Secondary | ICD-10-CM

## 2019-06-17 DIAGNOSIS — I314 Cardiac tamponade: Secondary | ICD-10-CM

## 2019-06-17 DIAGNOSIS — I3139 Other pericardial effusion (noninflammatory): Secondary | ICD-10-CM

## 2019-06-17 NOTE — Patient Instructions (Addendum)
Medication Instructions:  NO CHANGE *If you need a refill on your cardiac medications before your next appointment, please call your pharmacy*  Lab Work: Your physician recommends that you HAVE LAB WORK TODAY If you have labs (blood work) drawn today and your tests are completely normal, you will receive your results only by: [name] MyChart Message (if you have MyChart) OR . A paper copy in the mail If you have any lab test that is abnormal or we need to change your treatment, we will call you to review the results.  Follow-Up: At [facility], you and your health needs are our priority.  As part of our continuing mission to provide you with exceptional heart care, we have created designated Provider Care Teams.  These Care Teams include your primary Cardiologist (physician) and Advanced Practice Providers (APPs -  Physician Assistants and Nurse Practitioners) who all work together to provide you with the care you need, when you need it.  Your next appointment:   12 months  The format for your next appointment:   In Person  Provider:   [name], MD

## 2019-06-18 ENCOUNTER — Telehealth: Payer: Self-pay | Admitting: *Deleted

## 2019-06-18 DIAGNOSIS — I5032 Chronic diastolic (congestive) heart failure: Secondary | ICD-10-CM

## 2019-06-18 LAB — BASIC METABOLIC PANEL
BUN/Creatinine Ratio: 16 (ref 12–28)
BUN: 20 mg/dL (ref 8–27)
CO2: 23 mmol/L (ref 20–29)
Calcium: 9.6 mg/dL (ref 8.7–10.3)
Chloride: 99 mmol/L (ref 96–106)
Creatinine, Ser: 1.24 mg/dL — ABNORMAL HIGH (ref 0.57–1.00)
GFR calc Af Amer: 50 mL/min/{1.73_m2} — ABNORMAL LOW (ref >59)
GFR calc non Af Amer: 44 mL/min/{1.73_m2} — ABNORMAL LOW (ref >59)
Glucose: 77 mg/dL (ref 65–99)
Potassium: 5 mmol/L (ref 3.5–5.2)
Sodium: 139 mmol/L (ref 134–144)

## 2019-06-18 MED ORDER — POTASSIUM CHLORIDE CRYS ER 20 MEQ PO TBCR: 20.0000 meq | EXTENDED_RELEASE_TABLET | Freq: Every day | ORAL | 3 refills | Status: DC

## 2019-06-18 MED ORDER — FUROSEMIDE 40 MG PO TABS: 20.0000 mg | ORAL_TABLET | Freq: Every day | ORAL | 11 refills | Status: DC

## 2019-06-18 NOTE — Telephone Encounter (Addendum)
Results released to my chart    ----- Message from Lelon Perla, MD sent at 06/18/2019  7:20 AM EST ----- Decrease lasix to 20 mg daily and kdur to 20 meq daily; bmet one week [name]

## 2019-06-30 ENCOUNTER — Other Ambulatory Visit: Payer: Self-pay | Admitting: Primary Care

## 2019-06-30 DIAGNOSIS — F419 Anxiety disorder, unspecified: Secondary | ICD-10-CM

## 2019-06-30 DIAGNOSIS — F324 Major depressive disorder, single episode, in partial remission: Secondary | ICD-10-CM

## 2019-07-01 DIAGNOSIS — F330 Major depressive disorder, recurrent, mild: Secondary | ICD-10-CM | POA: Diagnosis not present

## 2019-07-01 DIAGNOSIS — F4312 Post-traumatic stress disorder, chronic: Secondary | ICD-10-CM | POA: Diagnosis not present

## 2019-07-01 NOTE — Telephone Encounter (Signed)
Noted, refill sent to pharmacy. 

## 2019-07-01 NOTE — Telephone Encounter (Signed)
Electronic refill request Gabapentin Last refill 05/15/19 #90/1 Last office visit 03/25/19

## 2019-07-10 ENCOUNTER — Telehealth: Payer: Self-pay | Admitting: Cardiology

## 2019-07-10 NOTE — Telephone Encounter (Signed)
Called patient, she advised that something was sent to her but was sent back- I looked over mychart message, advised it seemed they had sent her the lab work orders to have repeat labs. Patient will come to our office first of next week- since the orders are in our system we should see them. Patient verbalized thanks for the call.

## 2019-07-10 NOTE — Telephone Encounter (Signed)
° °  Patient requesting labs be mailed to address on file. Also requesting call to discuss labs and medication changes

## 2019-07-14 DIAGNOSIS — G4733 Obstructive sleep apnea (adult) (pediatric): Secondary | ICD-10-CM | POA: Diagnosis not present

## 2019-07-14 DIAGNOSIS — I10 Essential (primary) hypertension: Secondary | ICD-10-CM | POA: Diagnosis not present

## 2019-07-15 DIAGNOSIS — I5032 Chronic diastolic (congestive) heart failure: Secondary | ICD-10-CM | POA: Diagnosis not present

## 2019-07-16 LAB — BASIC METABOLIC PANEL
BUN/Creatinine Ratio: 14 (ref 12–28)
BUN: 16 mg/dL (ref 8–27)
CO2: 25 mmol/L (ref 20–29)
Calcium: 9.6 mg/dL (ref 8.7–10.3)
Chloride: 101 mmol/L (ref 96–106)
Creatinine, Ser: 1.17 mg/dL — ABNORMAL HIGH (ref 0.57–1.00)
GFR calc Af Amer: 54 mL/min/{1.73_m2} — ABNORMAL LOW (ref >59)
GFR calc non Af Amer: 47 mL/min/{1.73_m2} — ABNORMAL LOW (ref >59)
Glucose: 122 mg/dL — ABNORMAL HIGH (ref 65–99)
Potassium: 4.2 mmol/L (ref 3.5–5.2)
Sodium: 142 mmol/L (ref 134–144)

## 2019-07-25 DIAGNOSIS — F4312 Post-traumatic stress disorder, chronic: Secondary | ICD-10-CM | POA: Diagnosis not present

## 2019-07-25 DIAGNOSIS — F330 Major depressive disorder, recurrent, mild: Secondary | ICD-10-CM | POA: Diagnosis not present

## 2019-07-26 ENCOUNTER — Ambulatory Visit: Payer: PPO | Admitting: Primary Care

## 2019-07-29 ENCOUNTER — Other Ambulatory Visit: Payer: Self-pay

## 2019-07-29 ENCOUNTER — Ambulatory Visit: Payer: PPO | Admitting: Primary Care

## 2019-07-29 ENCOUNTER — Ambulatory Visit (INDEPENDENT_AMBULATORY_CARE_PROVIDER_SITE_OTHER): Payer: PPO | Admitting: Primary Care

## 2019-07-29 ENCOUNTER — Encounter: Payer: Self-pay | Admitting: Primary Care

## 2019-07-29 DIAGNOSIS — G20 Parkinson's disease: Secondary | ICD-10-CM | POA: Insufficient documentation

## 2019-07-29 DIAGNOSIS — R251 Tremor, unspecified: Secondary | ICD-10-CM

## 2019-07-29 DIAGNOSIS — G20C Parkinsonism, unspecified: Secondary | ICD-10-CM | POA: Insufficient documentation

## 2019-07-29 NOTE — Assessment & Plan Note (Signed)
Resting tremor noted during exam.  Will trial her off of gabapentin given that symptoms began after initiation of this medication.   Will also refer her to neurology per patient request.   She will update in 2 weeks.

## 2019-07-29 NOTE — Progress Notes (Signed)
Subjective:    Patient ID: Selena Bush, female    DOB: 11-27-1945, 73 y.o.   MRN: [id number]  HPI  Ms. Friar is a 73 year old female with a history of hypertension, CHF, paroxysmal atrial fibrillation, DCIS, hyperlipidemia, anxiety, DDD who presents today to discuss tremor.  Her tremor began about 6-8 months ago, beginning to the index finger with gradual increase in tremor to left upper extremity. The tremor doesn't keep her from eating or attending to daily living activities but she does notice that she can't carry a full coffee cup at times as it will spill. She's notices some weakness with her grip strength.   She denies pain, numbness/tingling, color changes. She does have chronic left shoulder pain that began long before her tremor. She is managed on gabapentin that was initiated prior to symptoms beginning. She has not tried stopping this medication.   Review of Systems  Skin: Negative for color change.  Neurological: Positive for tremors. Negative for numbness.       Past Medical History:  Diagnosis Date  . Anxiety   . Atrial fibrillation ([place])   . Chronic diastolic CHF (congestive heart failure) ([place])    a. echo 09/2014: EF [id number], diastolic dysfunction, mild LVH, nl RV size & systolic function, mildly dilated LA (4.3 cm), mild MR/TR, mildly elevated PASP 36.7 mm Hg  . DDD (degenerative disc disease), cervical   . DDD (degenerative disc disease), lumbar   . Depression   . Diffuse cystic mastopathy 2014  . Dyspnea   . GERD (gastroesophageal reflux disease)   . Gout   . Headache    rare  . HLD (hyperlipidemia)    a. statin intolerant 2/2 myalgias  . Hypercholesterolemia   . Hypertension   . Malignant neoplasm of upper-outer quadrant of female breast ([place]) 10/2012   Papillary DCIS, sentinel node negative. DR/PR positive. PARTIAL RIGHT MASTECTOMY FOR BREAST CANCER--HAD RADIATION - NO CHEMO --DR. [place] ONCOLOGIST  . Obesity   . OSA on CPAP   .  Osteoarthritis of both knees    a. s/p right TKA 04/2013 & left TKA 09/2014  . Otitis externa   . Personal history of radiation therapy 2015   RIGHT breast-mammosite per pt  . Sleep difficulties    LUNESTA HAS HELPED  . Vaginal cyst      Social History   Socioeconomic History  . Marital status: Widowed    Spouse name: Chrissie Noa  . Number of children: 2  . Years of education: College  . Highest education level: Bachelor's degree (e.g., BA, AB, BS)  Occupational History  . Occupation: Retired  Tobacco Use  . Smoking status: Never Smoker  . Smokeless tobacco: Never Used  . Tobacco comment: social smoker as a teen  Substance and Sexual Activity  . Alcohol use: No  . Drug use: No  . Sexual activity: Not Currently    Birth control/protection: Post-menopausal, Surgical  Other Topics Concern  . Not on file  Social History Narrative   Marital status: widowed since 09/16/2015      Children: 2 children (90, 71); 5 grandchild (4 in [place]; 1 in [place]).      Lives: alone in townhome; 1 dog, 2 cats      Employment: psychiatric Education officer, museum; retired in 2015      Tobacco: teenager only      Alcohol:  None      Drugs: none      Exercise: walking in 2019; more active  in 2019.  Walking daily small amounts.        ADLs: drives; independent with ADLs; no assistant devices      Advanced Directives: none; FULL CODE; no prolonged measures.   Does not need them.  Daughter/Heather Vista Deck is HCPOA.    Social Determinants of Health   Financial Resource Strain:   . Difficulty of Paying Living Expenses: Not on file  Food Insecurity:   . Worried About Charity fundraiser in the Last Year: Not on file  . Ran Out of Food in the Last Year: Not on file  Transportation Needs:   . Lack of Transportation (Medical): Not on file  . Lack of Transportation (Non-Medical): Not on file  Physical Activity:   . Days of Exercise per Week: Not on file  . Minutes of Exercise per Session: Not on file  Stress:   .  Feeling of Stress : Not on file  Social Connections:   . Frequency of Communication with Friends and Family: Not on file  . Frequency of Social Gatherings with Friends and Family: Not on file  . Attends Religious Services: Not on file  . Active Member of Clubs or Organizations: Not on file  . Attends Archivist Meetings: Not on file  . Marital Status: Not on file  Intimate Partner Violence:   . Fear of Current or Ex-Partner: Not on file  . Emotionally Abused: Not on file  . Physically Abused: Not on file  . Sexually Abused: Not on file    Past Surgical History:  Procedure Laterality Date  . ABDOMINAL HYSTERECTOMY  1992   DUB; fibroids; endometriosis.  One remaining ovary.    [name] BREAST LUMPECTOMY Right 2015   Papillary DCIS, sentinel node negative. DR/PR positive. PARTIAL RIGHT MASTECTOMY FOR BREAST CANCER--HAD RADIATION - NO CHEMO --DR. [place] ONCOLOGIST  . BREAST SURGERY Right March 2014   Wide excision,APB RT 10 mm papillary DCIS, ER/PR positive. Sentinel node negative. Partial breast radiation.  [name] CATARACT EXTRACTION, BILATERAL  02/13/2016   [place].  . CHOLECYSTECTOMY  1994  . COLONOSCOPY  2015   1 benign polyp-every 5 years/ Dr Candace Cruise  . ERCP  1995  . JOINT REPLACEMENT Right Sept 2014   knee  . PERICARDIOCENTESIS N/A 11/13/2017   Procedure: PERICARDIOCENTESIS;  Surgeon: Nelva Bush, MD;  Location: [facility];  Service: Cardiovascular;  Laterality: N/A;  . TOTAL KNEE ARTHROPLASTY Right 04/16/2013   Procedure: RIGHT TOTAL KNEE ARTHROPLASTY;  Surgeon: Mauri Pole, MD;  Location: [facility];  Service: Orthopedics;  Laterality: Right;  . TOTAL KNEE ARTHROPLASTY Left 09/15/2014   Procedure: LEFT TOTAL KNEE ARTHROPLASTY;  Surgeon: Mauri Pole, MD;  Location: [facility];  Service: Orthopedics;  Laterality: Left;  . TUBAL LIGATION  1979    Family History  Problem Relation Age of Onset  . Colon cancer Mother   . Cancer Mother 40       colon cancer  .  Melanoma Father   . Cancer Father 31       melanoma  . Colon cancer Maternal Grandfather   . Breast cancer Maternal Grandfather     Allergies  Allergen Reactions  . Penicillins Anaphylaxis    anaphylaxis  Has patient had a PCN reaction causing immediate rash, facial/tongue/throat swelling, SOB or lightheadedness with hypotension: Yes Has patient had a PCN reaction causing severe rash involving mucus membranes or skin necrosis: No Has patient had a PCN reaction that required hospitalization: No Has patient  had a PCN reaction occurring within the last 10 years: No If all of the above answers are "NO", then may proceed with Cephalosporin use.   . Sulfa Antibiotics Anaphylaxis  . Codeine Other (See Comments) and Nausea And Vomiting    Gi problems   . Statins Other (See Comments)    Leg pains  . Aspirin Other (See Comments)    "burned my stomach intensely" Abdominal pain and burning    Current Outpatient Medications on File Prior to Visit  Medication Sig Dispense Refill  . diltiazem (CARDIZEM CD) 180 MG 24 hr capsule TAKE 1 CAPSULE BY MOUTH DAILY. 30 capsule 1  . DULoxetine (CYMBALTA) 60 MG capsule TAKE 1 CAPSULE BY MOUTH 2 TIMES A DAY 180 capsule 1  . fluticasone (FLONASE) 50 MCG/ACT nasal spray Place 2 sprays into both nostrils daily as needed for rhinitis or allergies. 16 g 5  . furosemide (LASIX) 40 MG tablet Take 0.5 tablets (20 mg total) by mouth daily. 30 tablet 11  . gabapentin (NEURONTIN) 100 MG capsule TAKE 1 CAPSULE BY MOUTH THREE TIMES A DAY 270 capsule 0  . metoprolol tartrate (LOPRESSOR) 25 MG tablet TAKE 3 TABLETS BY MOUTH TWICE A DAY 180 tablet 1  . potassium chloride SA (KLOR-CON M20) 20 MEQ tablet Take 1 tablet (20 mEq total) by mouth daily. 90 tablet 3  . sotalol (BETAPACE) 120 MG tablet TAKE 1 TABLET BY MOUTH EVERY 12 HOURS 180 tablet 0  . tiZANidine (ZANAFLEX) 4 MG tablet TAKE 2 TABLET (4 MG TOTAL) BY MOUTH DAILY FOR MUSCLE SPASMS AT BEDTIME 180 tablet 1   No  current facility-administered medications on file prior to visit.    BP 122/80   Pulse 61   Temp (!) 97 F (36.1 C) (Temporal)   Ht 5\' 2"  (1.575 m)   Wt 205 lb 8 oz (93.2 kg)   SpO2 98%   BMI 37.59 kg/m    Objective:   Physical Exam  Constitutional: She appears well-nourished.  Cardiovascular: Normal rate.  Respiratory: Effort normal.  Musculoskeletal:     Comments: Left grip strength slightly weaker than right  Neurological:  Resting tremor to left hand noted during exam  Skin: Skin is warm and dry. No erythema.           Assessment & Plan:

## 2019-07-29 NOTE — Patient Instructions (Signed)
Stop gabapentin medication to see if this causes the tremor.  You will be contacted regarding your referral to neurology.  Please let us know if you have not been contacted within two weeks.   It was a pleasure to see you today!

## 2019-08-13 ENCOUNTER — Other Ambulatory Visit: Payer: Self-pay | Admitting: Cardiology

## 2019-08-13 DIAGNOSIS — I10 Essential (primary) hypertension: Secondary | ICD-10-CM | POA: Diagnosis not present

## 2019-08-13 DIAGNOSIS — G4733 Obstructive sleep apnea (adult) (pediatric): Secondary | ICD-10-CM | POA: Diagnosis not present

## 2019-08-13 MED ORDER — METOPROLOL TARTRATE 25 MG PO TABS: ORAL_TABLET | ORAL | 12 refills | Status: DC

## 2019-08-13 NOTE — Telephone Encounter (Signed)
New Message   *STAT* If patient is at the pharmacy, call can be transferred to refill team.   1. Which medications need to be refilled? (please list name of each medication and dose if known) metoprolol tartrate (LOPRESSOR) 25 MG tablet  2. Which pharmacy/location (including street and city if local pharmacy) is medication to be sent to? [id number] [place], [place] - [address] AT [facility]  3. Do they need a 30 day or 90 day supply? [place]

## 2019-08-14 ENCOUNTER — Other Ambulatory Visit: Payer: Self-pay

## 2019-08-14 DIAGNOSIS — Z1231 Encounter for screening mammogram for malignant neoplasm of breast: Secondary | ICD-10-CM

## 2019-08-21 DIAGNOSIS — F4312 Post-traumatic stress disorder, chronic: Secondary | ICD-10-CM | POA: Diagnosis not present

## 2019-08-21 DIAGNOSIS — R69 Illness, unspecified: Secondary | ICD-10-CM | POA: Diagnosis not present

## 2019-08-26 DIAGNOSIS — E038 Other specified hypothyroidism: Secondary | ICD-10-CM | POA: Diagnosis not present

## 2019-08-26 DIAGNOSIS — R251 Tremor, unspecified: Secondary | ICD-10-CM | POA: Diagnosis not present

## 2019-08-26 DIAGNOSIS — E559 Vitamin D deficiency, unspecified: Secondary | ICD-10-CM | POA: Diagnosis not present

## 2019-08-26 DIAGNOSIS — E538 Deficiency of other specified B group vitamins: Secondary | ICD-10-CM | POA: Diagnosis not present

## 2019-08-26 DIAGNOSIS — Q998 Other specified chromosome abnormalities: Secondary | ICD-10-CM | POA: Diagnosis not present

## 2019-08-26 DIAGNOSIS — E519 Thiamine deficiency, unspecified: Secondary | ICD-10-CM | POA: Diagnosis not present

## 2019-08-26 DIAGNOSIS — Z79899 Other long term (current) drug therapy: Secondary | ICD-10-CM | POA: Diagnosis not present

## 2019-09-02 ENCOUNTER — Telehealth: Payer: Self-pay | Admitting: Cardiology

## 2019-09-02 NOTE — Telephone Encounter (Signed)
No message needed °

## 2019-09-03 ENCOUNTER — Ambulatory Visit: Payer: Medicare HMO

## 2019-09-13 DIAGNOSIS — I10 Essential (primary) hypertension: Secondary | ICD-10-CM | POA: Diagnosis not present

## 2019-09-13 DIAGNOSIS — G4733 Obstructive sleep apnea (adult) (pediatric): Secondary | ICD-10-CM | POA: Diagnosis not present

## 2019-09-18 DIAGNOSIS — R69 Illness, unspecified: Secondary | ICD-10-CM | POA: Diagnosis not present

## 2019-09-18 DIAGNOSIS — F4312 Post-traumatic stress disorder, chronic: Secondary | ICD-10-CM | POA: Diagnosis not present

## 2019-09-23 ENCOUNTER — Telehealth: Payer: Self-pay | Admitting: Primary Care

## 2019-09-23 NOTE — Progress Notes (Signed)
°  Chronic Care Management   Outreach Note  09/23/2019 Name: Selena Bush MRN: [id number] DOB: [birth year]  Referred by: Pleas Koch, NP Reason for referral : No chief complaint on file.   An unsuccessful telephone outreach was attempted today. The patient was referred to the pharmacist for assistance with care management and care coordination.   Follow Up Plan:   [name] [facility]

## 2019-09-23 NOTE — Chronic Care Management (AMB) (Signed)
  Chronic Care Management   Note  09/23/2019 Name: Selena Bush MRN: [id number] DOB: [birth year]  Selena Bush is a 74 y.o. year old female who is a primary care patient of Pleas Koch, NP. I reached out to [name] by phone today in response to a referral sent by [name]'s PCP, Pleas Koch, NP.   [name] was given information about Chronic Care Management services today including:  1. CCM service includes personalized support from designated clinical staff supervised by her physician, including individualized plan of care and coordination with other care providers 2. 24/7 contact phone numbers for assistance for urgent and routine care needs. 3. Service will only be billed when office clinical staff spend 20 minutes or more in a month to coordinate care. 4. Only one practitioner may furnish and bill the service in a calendar month. 5. The patient may stop CCM services at any time (effective at the end of the month) by phone call to the office staff. 6. The patient will be responsible for cost sharing (co-pay) of up to 20% of the service fee (after annual deductible is met).  Patient agreed to services and verbal consent obtained.   Follow up plan:   [name] [facility] Scheduler

## 2019-09-24 ENCOUNTER — Other Ambulatory Visit: Payer: Self-pay | Admitting: General Surgery

## 2019-09-24 DIAGNOSIS — I5032 Chronic diastolic (congestive) heart failure: Secondary | ICD-10-CM

## 2019-09-24 DIAGNOSIS — D0510 Intraductal carcinoma in situ of unspecified breast: Secondary | ICD-10-CM

## 2019-09-27 ENCOUNTER — Other Ambulatory Visit: Payer: Self-pay | Admitting: Cardiology

## 2019-09-27 ENCOUNTER — Ambulatory Visit: Payer: Medicare HMO

## 2019-09-27 ENCOUNTER — Other Ambulatory Visit: Payer: Self-pay

## 2019-09-27 ENCOUNTER — Other Ambulatory Visit: Payer: Self-pay | Admitting: Primary Care

## 2019-09-27 DIAGNOSIS — M170 Bilateral primary osteoarthritis of knee: Secondary | ICD-10-CM

## 2019-09-27 DIAGNOSIS — E662 Morbid (severe) obesity with alveolar hypoventilation: Secondary | ICD-10-CM

## 2019-09-27 DIAGNOSIS — I10 Essential (primary) hypertension: Secondary | ICD-10-CM

## 2019-09-27 DIAGNOSIS — F419 Anxiety disorder, unspecified: Secondary | ICD-10-CM

## 2019-09-27 DIAGNOSIS — M5136 Other intervertebral disc degeneration, lumbar region: Secondary | ICD-10-CM

## 2019-09-27 DIAGNOSIS — I480 Paroxysmal atrial fibrillation: Secondary | ICD-10-CM

## 2019-09-27 DIAGNOSIS — K219 Gastro-esophageal reflux disease without esophagitis: Secondary | ICD-10-CM

## 2019-09-27 DIAGNOSIS — I5032 Chronic diastolic (congestive) heart failure: Secondary | ICD-10-CM

## 2019-09-27 DIAGNOSIS — G4733 Obstructive sleep apnea (adult) (pediatric): Secondary | ICD-10-CM

## 2019-09-27 DIAGNOSIS — E7800 Pure hypercholesterolemia, unspecified: Secondary | ICD-10-CM

## 2019-09-27 DIAGNOSIS — F324 Major depressive disorder, single episode, in partial remission: Secondary | ICD-10-CM

## 2019-09-27 NOTE — Patient Instructions (Addendum)
September 27, 2019  Dear Selena Bush,  It was a pleasure meeting you during our initial appointment on September 27, 2019. Below is a summary of the goals we discussed and components of chronic care management. I've included a handout on the DASH diet below to assist with cholesterol lowering. Please contact me anytime with questions or concerns.  Visit Information  Goals Addressed            This Visit's Progress   . Pharmacy Care Plan       Current Barriers:  . Chronic Disease Management support, education, and care coordination needs related to hypertension, hyperlipidemia, congestive heart failure, GERD, osteoarthritis, AFIB, major depressive disorder, anxiety  Pharmacist Clinical Goal(s):  [name] Pharmacist will review medication safety every 6 months . Maintain blood pressure within goal of < 140/90 mmHg . Maintain average blood glucose (A1c) < 7% . Increase exercise slowly with goal of 150 minutes of moderate activity (such as brisk walking) each week  Interventions: . Comprehensive medication review performed. [facility] pharmacy packaging and delivery services  Patient Self Care Activities:  . Self administers medications as prescribed  Initial goal documentation        Selena Bush was given information about Chronic Care Management services today including:  1. CCM service includes personalized support from designated clinical staff supervised by her physician, including individualized plan of care and coordination with other care providers 2. 24/7 contact phone numbers for assistance for urgent and routine care needs. 3. Service will only be billed when office clinical staff spend 20 minutes or more in a month to coordinate care. 4. Only one practitioner may furnish and bill the service in a calendar month. 5. The patient may stop CCM services at any time (effective at the end of the month) by phone call to the office staff. 6. The patient will be responsible for  cost sharing (co-pay) of up to 20% of the service fee (after annual deductible is met).  Patient agreed to services and verbal consent obtained.   Verbal consent obtained for [facility] enhanced pharmacy services (medication synchronization, adherence packaging, delivery coordination). A medication sync plan was created to allow patient to get all medications delivered once every 30 to 90 days per patient preference. Patient understands they have freedom to choose pharmacy and clinical pharmacist will coordinate care between all prescribers and [facility].  Telephone follow up appointment with pharmacy team member scheduled for: Monday, March 23, 2020 at 3:30 PM   Selena Bush, PharmD Clinical Pharmacist [facility]  [place] stands for "Dietary Approaches to Stop Hypertension." The DASH eating plan is a healthy eating plan that has been shown to reduce high blood pressure (hypertension). It may also reduce your risk for type 2 diabetes, heart disease, and stroke. The DASH eating plan may also help with weight loss. What are tips for following this plan?  General guidelines  Avoid eating more than 2,300 mg (milligrams) of salt (sodium) a day. If you have hypertension, you may need to reduce your sodium intake to 1,500 mg a day.  Limit alcohol intake to no more than 1 drink a day for nonpregnant women and 2 drinks a day for men. One drink equals 12 oz of beer, 5 oz of wine, or 1 oz of hard liquor.  Work with your health care provider to maintain a healthy body weight or to lose weight. Ask what an ideal weight is for you.  Get at least 30 minutes of exercise that causes your heart to beat faster (aerobic exercise) most days of the week. Activities may include walking, swimming, or biking.  Work with your health care provider or diet and nutrition specialist (dietitian) to adjust your eating plan to your individual calorie  needs. Reading food labels   Check food labels for the amount of sodium per serving. Choose foods with less than 5 percent of the Daily Value of sodium. Generally, foods with less than 300 mg of sodium per serving fit into this eating plan.  To find whole grains, look for the word "whole" as the first word in the ingredient list. Shopping  Buy products labeled as "low-sodium" or "no salt added."  Buy fresh foods. Avoid canned foods and premade or frozen meals. Cooking  Avoid adding salt when cooking. Use salt-free seasonings or herbs instead of table salt or sea salt. Check with your health care provider or pharmacist before using salt substitutes.  Do not fry foods. Cook foods using healthy methods such as baking, boiling, grilling, and broiling instead.  Cook with heart-healthy oils, such as olive, canola, soybean, or sunflower oil. Meal planning  Eat a balanced diet that includes: ? 5 or more servings of fruits and vegetables each day. At each meal, try to fill half of your plate with fruits and vegetables. ? Up to 6-8 servings of whole grains each day. ? Less than 6 oz of lean meat, poultry, or fish each day. A 3-oz serving of meat is about the same size as a deck of cards. One egg equals 1 oz. ? 2 servings of low-fat dairy each day. ? A serving of nuts, seeds, or beans 5 times each week. ? Heart-healthy fats. Healthy fats called Omega-3 fatty acids are found in foods such as flaxseeds and coldwater fish, like sardines, salmon, and mackerel.  Limit how much you eat of the following: ? Canned or prepackaged foods. ? Food that is high in trans fat, such as fried foods. ? Food that is high in saturated fat, such as fatty meat. ? Sweets, desserts, sugary drinks, and other foods with added sugar. ? Full-fat dairy products.  Do not salt foods before eating.  Try to eat at least 2 vegetarian meals each week.  Eat more home-cooked food and less restaurant, buffet, and fast  food.  When eating at a restaurant, ask that your food be prepared with less salt or no salt, if possible. What foods are recommended? The items listed may not be a complete list. Talk with your dietitian about what dietary choices are best for you. Grains Whole-grain or whole-wheat bread. Whole-grain or whole-wheat pasta. Brown rice. Modena Morrow. Bulgur. Whole-grain and low-sodium cereals. Pita bread. Low-fat, low-sodium crackers. Whole-wheat flour tortillas. Vegetables Fresh or frozen vegetables (raw, steamed, roasted, or grilled). Low-sodium or reduced-sodium tomato and vegetable juice. Low-sodium or reduced-sodium tomato sauce and tomato paste. Low-sodium or reduced-sodium canned vegetables. Fruits All fresh, dried, or frozen fruit. Canned fruit in natural juice (without added sugar). Meat and other protein foods Skinless chicken or [place]. Ground chicken or [place]. Pork with fat trimmed off. Fish and seafood. Egg whites. Dried beans, peas, or lentils. Unsalted nuts, nut butters, and seeds. Unsalted canned beans. Lean cuts of beef with fat trimmed off. Low-sodium, lean deli meat. Dairy Low-fat (1%) or fat-free (skim) milk. Fat-free, low-fat, or reduced-fat cheeses. Nonfat, low-sodium ricotta or cottage cheese. Low-fat or nonfat yogurt. Low-fat, low-sodium cheese. Fats and oils Soft margarine without  trans fats. Vegetable oil. Low-fat, reduced-fat, or light mayonnaise and salad dressings (reduced-sodium). Canola, safflower, olive, soybean, and sunflower oils. Avocado. Seasoning and other foods Herbs. Spices. Seasoning mixes without salt. Unsalted popcorn and pretzels. Fat-free sweets. What foods are not recommended? The items listed may not be a complete list. Talk with your dietitian about what dietary choices are best for you. Grains Baked goods made with fat, such as croissants, muffins, or some breads. Dry pasta or rice meal packs. Vegetables Creamed or fried vegetables. Vegetables  in a cheese sauce. Regular canned vegetables (not low-sodium or reduced-sodium). Regular canned tomato sauce and paste (not low-sodium or reduced-sodium). Regular tomato and vegetable juice (not low-sodium or reduced-sodium). Angie Fava. Olives. Fruits Canned fruit in a light or heavy syrup. Fried fruit. Fruit in cream or butter sauce. Meat and other protein foods Fatty cuts of meat. Ribs. Fried meat. Berniece Salines. Sausage. Bologna and other processed lunch meats. Salami. Fatback. Hotdogs. Bratwurst. Salted nuts and seeds. Canned beans with added salt. Canned or smoked fish. Whole eggs or egg yolks. Chicken or [place] with skin. Dairy Whole or 2% milk, cream, and half-and-half. Whole or full-fat cream cheese. Whole-fat or sweetened yogurt. Full-fat cheese. Nondairy creamers. Whipped toppings. Processed cheese and cheese spreads. Fats and oils Butter. Stick margarine. Lard. Shortening. Ghee. Bacon fat. Tropical oils, such as coconut, palm kernel, or palm oil. Seasoning and other foods Salted popcorn and pretzels. Onion salt, garlic salt, seasoned salt, table salt, and sea salt. Worcestershire sauce. Tartar sauce. Barbecue sauce. Teriyaki sauce. Soy sauce, including reduced-sodium. Steak sauce. Canned and packaged gravies. Fish sauce. Oyster sauce. Cocktail sauce. Horseradish that you find on the shelf. Ketchup. Mustard. Meat flavorings and tenderizers. Bouillon cubes. Hot sauce and Tabasco sauce. Premade or packaged marinades. Premade or packaged taco seasonings. Relishes. Regular salad dressings. Where to find more information:  National Heart, Lung, and [place]: https://wilson-eaton.com/  American Heart Association: www.heart.org Summary  The DASH eating plan is a healthy eating plan that has been shown to reduce high blood pressure (hypertension). It may also reduce your risk for type 2 diabetes, heart disease, and stroke.  With the DASH eating plan, you should limit salt (sodium) intake to 2,300 mg a  day. If you have hypertension, you may need to reduce your sodium intake to 1,500 mg a day.  When on the DASH eating plan, aim to eat more fresh fruits and vegetables, whole grains, lean proteins, low-fat dairy, and heart-healthy fats.  Work with your health care provider or diet and nutrition specialist (dietitian) to adjust your eating plan to your individual calorie needs. This information is not intended to replace advice given to you by your health care provider. Make sure you discuss any questions you have with your health care provider. Document Revised: 07/14/2017 Document Reviewed: 07/25/2016 Elsevier Patient Education  2020 [employer].

## 2019-09-27 NOTE — Telephone Encounter (Signed)
Message left for patient to return my call.  

## 2019-09-27 NOTE — Telephone Encounter (Signed)
She is due for follow up with me in March 2021, please schedule. Also did her tremor improve when we stopped the gabapentin? Does she want to continue?

## 2019-09-27 NOTE — Telephone Encounter (Signed)
Last prescribed on 07/01/2019 #270 with 0 refills. Last appointment on 07/29/2019 . Next future appointment on 09/27/2019 with Sharyn Lull (pharmarist).

## 2019-09-27 NOTE — Chronic Care Management (AMB) (Signed)
Chronic Care Management Pharmacy  Name: Selena Bush  MRN: [id number] DOB: 10-21-1945  Chief Complaint/ HPI  Selena Bush,  74 y.o., female presents for their Initial CCM visit with the clinical pharmacist via telephone.  PCP : Pleas Koch, NP  Specialists:  Chipper Herb, Neurology   Miguel Dibble, Psychotherapy  [name], Cardiolgy  Their chronic conditions include: HTN, HLD, CHF, GERD, OA, AFIB, obesity, OSA, MDD, anxiety  Patient concerns: none, medications are working well   Office Visits:  07/29/19: Carlis Abbott, tremor of left hand - neurology referral, stop gabapentin  Consult Visit:  08/26/19: Chipper Herb, NP Neurology - referred for tremor, increase gabapentin dosing, consider primidone next, vitamin D and B12 labs low  06/17/19: [place], cardiology - no med changes  Allergies  Allergen Reactions  . Penicillins Anaphylaxis    anaphylaxis  Has patient had a PCN reaction causing immediate rash, facial/tongue/throat swelling, SOB or lightheadedness with hypotension: Yes Has patient had a PCN reaction causing severe rash involving mucus membranes or skin necrosis: No Has patient had a PCN reaction that required hospitalization: No Has patient had a PCN reaction occurring within the last 10 years: No If all of the above answers are "NO", then may proceed with Cephalosporin use.   . Sulfa Antibiotics Anaphylaxis  . Codeine Other (See Comments) and Nausea And Vomiting    Gi problems   . Statins Other (See Comments)    Leg pains  . Aspirin Other (See Comments)    "burned my stomach intensely" Abdominal pain and burning    Medications: Outpatient Encounter Medications as of 09/27/2019  Medication Sig  . diltiazem (CARDIZEM CD) 180 MG 24 hr capsule TAKE 1 CAPSULE BY MOUTH DAILY.  . DULoxetine (CYMBALTA) 60 MG capsule TAKE 1 CAPSULE BY MOUTH 2 TIMES A DAY  . fluticasone (FLONASE) 50 MCG/ACT nasal spray Place 2 sprays into both nostrils daily as  needed for rhinitis or allergies.  . furosemide (LASIX) 40 MG tablet Take 0.5 tablets (20 mg total) by mouth daily.  [name] gabapentin (NEURONTIN) 100 MG capsule TAKE 1 CAPSULE BY MOUTH THREE TIMES A DAY  . metoprolol tartrate (LOPRESSOR) 25 MG tablet TAKE 3 TABLETS BY MOUTH 2 TIMES DAILY. PLEASE SCHEDULE APPT FOR REFILLS  . potassium chloride SA (KLOR-CON M20) 20 MEQ tablet Take 1 tablet (20 mEq total) by mouth daily.  . sotalol (BETAPACE) 120 MG tablet TAKE 1 TABLET BY MOUTH EVERY 12 HOURS  . tiZANidine (ZANAFLEX) 4 MG tablet TAKE 2 TABLET (4 MG TOTAL) BY MOUTH DAILY FOR MUSCLE SPASMS AT BEDTIME   No facility-administered encounter medications on file as of 09/27/2019.    Current Diagnosis/Assessment: Goals    . Pharmacy Care Plan     Current Barriers:  . Chronic Disease Management support, education, and care coordination needs related to hypertension, hyperlipidemia, congestive heart failure, GERD, osteoarthritis, AFIB, major depressive disorder, anxiety  Pharmacist Clinical Goal(s):  [name] Pharmacist will review medication safety every 6 months . Maintain blood pressure within goal of < 140/90 mmHg . Maintain average blood glucose (A1c) < 7% . Increase exercise slowly with goal of 150 minutes of moderate activity (such as brisk walking) each week  Interventions: . Comprehensive medication review performed. Licensed conveyancer pharmacy packaging and delivery services  Patient Self Care Activities:  . Self administers medications as prescribed  Initial goal documentation       AFIB  Symptoms: none reported Patient is currently rate/rhythm controlled Does not check heart rate at home  Patient has failed these meds in past: Xarelto - hemorrhagic pericardial effusion  Patient is currently controlled on the following medications:   Sotalol 120 mg - 1 tablet every 12 hours  Diltiazem 180 mg - 1 capsule daily  Metoprolol tartrate 25 mg - 3 tablets twice daily  Plan: Continue current  medications  Diabetes   Recent Relevant Labs: Lab Results  Component Value Date/Time   HGBA1C 6.0 10/16/2018 12:28 PM   HGBA1C 5.7 (H) 11/04/2017 10:13 AM    Checking BG: none Patient has failed these meds in past: none  Patient is currently controlled on the following medications:   No pharmacotherapy We discussed: diet and exercise  Plan: Continue current medications  Heart Failure   Type: Diastolic Last ejection fraction: 05/2018  65-70% NYHA Class: I (no activity limitation) AHA HF Stage: B (Heart disease present - no symptoms present)  Patient has failed these meds in past: none  Patient is currently controlled on the following medications:   Metoprolol tartrate 25 mg - 3 tablets twice daily  Furosemide 40 mg - one-half tablet daily  Potassium Cl SA 20 mEq - 1 tablet daily We discussed  denies swelling or shortness of breath   Plan: Continue current medications ,  Hypertension   Office blood pressures are  BP Readings from Last 3 Encounters:  07/29/19 122/80  06/17/19 122/70  03/25/19 140/90   CMP Latest Ref Rng & Units 07/15/2019 06/17/2019 10/16/2018  Glucose 65 - 99 mg/dL 122(H) 77 90  BUN 8 - 27 mg/dL 16 20 19   Creatinine 0.57 - 1.00 mg/dL 1.17(H) 1.24(H) 0.97  Sodium 134 - 144 mmol/L 142 139 138  Potassium 3.5 - 5.2 mmol/L 4.2 5.0 4.4  Chloride 96 - 106 mmol/L 101 99 99  CO2 20 - 29 mmol/L 25 23 32  Calcium 8.7 - 10.3 mg/dL 9.6 9.6 10.0  Total Protein 6.0 - 8.3 g/dL - - 7.2  Total Bilirubin 0.2 - 1.2 mg/dL - - 0.4  Alkaline Phos 39 - 117 U/L - - 114  AST 0 - 37 U/L - - 16  ALT 0 - 35 U/L - - 14   Assessment:  Comorbidity: OSA: CPAP - wears every night   Exercise: walking throughout the day    Denies any dizziness, lightheadedness or other signs of hypotension  Patient has tried these meds in the past:  Lisinopril-HCTZ (stopped after metoprolol and sotalol were added)  Patient checks BP at home: when feeling symptomatic  Patient home BP  readings are ranging: none reported  Patient is currently controlled on the following medications:  Metoprolol tartrate 25 mg - 2 tablets twice daily  Furosemide 40 mg - one-half tablet daily  Diltiazem 180 mg - 1 capsule daily  Plan: Continue current medications   Hyperlipidemia    Lipid Panel     Component Value Date/Time   CHOL 245 (H) 10/16/2018 1228   CHOL 221 (H) 10/02/2017 1054   TRIG 198.0 (H) 10/16/2018 1228   HDL 60.90 10/16/2018 1228   HDL 60 10/02/2017 1054   CHOLHDL 4 10/16/2018 1228   VLDL 39.6 10/16/2018 1228   LDLCALC 145 (H) 10/16/2018 1228   LDLCALC 132 (H) 10/02/2017 1054   LABVLDL 29 10/02/2017 1054   Patient has failed these meds in past: statins (self-reported "all of them", per chart review pt tried simvastatin and lovastatin) - leg pain, zetia, red yeast rice Patient is currently uncontrolled on the following medications:   OTC fish oil - 2  capsules day  We discussed: Patient recently resumed fish oil; recommended weekly statin dosing, patient is open, but not ready to try, reports by second week on all statins she was miserable.  Plan: Continue current medications and control with diet and exercise. Will discuss weekly statin again at follow up.  MDD/Anxiety/Insomnia   Patient has failed these meds in past: sertraline (possible SIADH), suvorexant (unknown)  Patient is currently controlled on the following medications:   Duloxetine 60 mg - 1 capsule twice daily   Gabapentin 100 mg - 1 capsule TID We discussed:  Sleeping very well, takes gabapentin for anxiety and duloxetine for depression per patient, finds both very beneficial; tried stopping gabapentin per PCP to see if tremor would improve, tremor did not resolve, resumed therapy, has follow up with neurology for essential tremor    Plan: Continue current medications  Osteoarthritis/DDD  Patient has failed these meds in past: none reported Patient is currently controlled on the following  medications:   Tizanidine 4 mg - 2 tablets daily at bedtime for muscle spasms   Plan: Continue current medications  Medication Management  Misc: will be receiving B12 injections from neuro office soon  OTCs: flonase 50 mcg/act, vitamin D3 1000 IU - 2 caps daily  [facility] [place], [place]  Adherence:[facility], they switch up the dose timing without asking, moved her tizanidine in the morning and made her drowsy all day  Social support: lots of family help, church  Affordability: no concerns  Vaccines: COVID-19, influenza, PCV13, PPSV23, Td - up to date; appears pt due for second dose of Shingrix   Verbal consent obtained for [facility] enhanced pharmacy services (medication synchronization, adherence packaging, delivery coordination). A medication sync plan was created to allow patient to get all medications delivered once every 30 to 90 days per patient preference. Patient understands they have freedom to choose pharmacy and clinical pharmacist will coordinate care between all prescribers and [facility].   CCM Follow Up:  Monday, March 23, 2020 3:30 PM (telephone)  [name], PharmD Clinical Pharmacist [facility] at [facility] [phone]

## 2019-09-30 ENCOUNTER — Telehealth: Payer: Self-pay

## 2019-09-30 DIAGNOSIS — I480 Paroxysmal atrial fibrillation: Secondary | ICD-10-CM

## 2019-09-30 DIAGNOSIS — I10 Essential (primary) hypertension: Secondary | ICD-10-CM

## 2019-09-30 DIAGNOSIS — R69 Illness, unspecified: Secondary | ICD-10-CM | POA: Diagnosis not present

## 2019-09-30 NOTE — Telephone Encounter (Signed)
I would like to request a referral for Selena Bush to chronic care management pharmacy services focusing on the following conditions:   Essential hypertension, benign  [I10]  PAF (paroxysmal atrial fibrillation) ([place]) [I48.0]  [name], PharmD Clinical Pharmacist [facility] at [facility] [phone]

## 2019-10-01 NOTE — Telephone Encounter (Signed)
Message left for patient to return my call.  

## 2019-10-01 NOTE — Telephone Encounter (Signed)
Patient is returning your call.  

## 2019-10-01 NOTE — Telephone Encounter (Signed)
Approved.  

## 2019-10-01 NOTE — Telephone Encounter (Signed)
Referral pend as insrtucted and route to [employer]

## 2019-10-02 NOTE — Telephone Encounter (Signed)
Noted, Rx sent to pharmacy. 

## 2019-10-02 NOTE — Telephone Encounter (Signed)
Spoken and notified patient of Selena Bush comments. Patient stated that she does not want to schedule right because she thought, she already had follow up and tried to tell that not the case (made a note to call patient next month).  Patient stated no, it did not improve at all. Patient would like to continue taking the gabepentin because with her anxiety.

## 2019-10-07 ENCOUNTER — Other Ambulatory Visit: Payer: Self-pay | Admitting: Primary Care

## 2019-10-07 DIAGNOSIS — M62838 Other muscle spasm: Secondary | ICD-10-CM

## 2019-10-07 DIAGNOSIS — F419 Anxiety disorder, unspecified: Secondary | ICD-10-CM

## 2019-10-07 DIAGNOSIS — F324 Major depressive disorder, single episode, in partial remission: Secondary | ICD-10-CM

## 2019-10-07 MED ORDER — DULOXETINE HCL 60 MG PO CPEP: ORAL_CAPSULE | ORAL | 0 refills | Status: DC

## 2019-10-07 MED ORDER — GABAPENTIN 100 MG PO CAPS: ORAL_CAPSULE | ORAL | 0 refills | Status: DC

## 2019-10-07 MED ORDER — TIZANIDINE HCL 4 MG PO TABS: ORAL_TABLET | ORAL | 0 refills | Status: DC

## 2019-10-07 NOTE — Telephone Encounter (Signed)
Patient had a phone visit with you on 2/12 and stated that it was recommended for her to start using a pharmacy in [place] for her pill packs. She stated [facility] letting [facility] know she is due for a [facility]ill. She wanted to know what she should tell them does she need to refill this with them or decline the refill and wait for the pill packs from the [place] location   Patient is requesting a call back

## 2019-10-07 NOTE — Telephone Encounter (Signed)
Ok to refill 

## 2019-10-07 NOTE — Telephone Encounter (Signed)
DONNA-MARIE CHEAK is transferring her preferred pharmacy to [facility] for coordinated pharmacy delivery services. She needs the following medication refills as soon as possible (current supply runs out 10/10/19):   Duloxetine 60 mg  Gabapentin 100 mg  Tizanidine 4 mg  Please send refills to [facility] in [place].  Thank you!  [name], PharmD Clinical Pharmacist [facility] at [facility] [phone]

## 2019-10-07 NOTE — Telephone Encounter (Signed)
Will fill for 30 day supply as she is due for a follow up visit with me in March. Please have her scheduled.

## 2019-10-08 ENCOUNTER — Other Ambulatory Visit: Payer: Self-pay | Admitting: *Deleted

## 2019-10-08 MED ORDER — METOPROLOL TARTRATE 25 MG PO TABS: ORAL_TABLET | ORAL | 3 refills | Status: DC

## 2019-10-08 MED ORDER — DILTIAZEM HCL ER COATED BEADS 180 MG PO CP24: 180.0000 mg | ORAL_CAPSULE | Freq: Every day | ORAL | 3 refills | Status: DC

## 2019-10-08 MED ORDER — SOTALOL HCL 120 MG PO TABS: 120.0000 mg | ORAL_TABLET | Freq: Two times a day (BID) | ORAL | 3 refills | Status: DC

## 2019-10-08 MED ORDER — POTASSIUM CHLORIDE CRYS ER 20 MEQ PO TBCR: 20.0000 meq | EXTENDED_RELEASE_TABLET | Freq: Every day | ORAL | 3 refills | Status: DC

## 2019-10-08 MED ORDER — FUROSEMIDE 40 MG PO TABS: 20.0000 mg | ORAL_TABLET | Freq: Every day | ORAL | 3 refills | Status: DC

## 2019-10-14 ENCOUNTER — Ambulatory Visit: Payer: PPO | Admitting: Surgery

## 2019-10-16 DIAGNOSIS — R69 Illness, unspecified: Secondary | ICD-10-CM | POA: Diagnosis not present

## 2019-10-16 DIAGNOSIS — F4312 Post-traumatic stress disorder, chronic: Secondary | ICD-10-CM | POA: Diagnosis not present

## 2019-10-30 ENCOUNTER — Other Ambulatory Visit: Payer: Self-pay

## 2019-10-30 ENCOUNTER — Ambulatory Visit (INDEPENDENT_AMBULATORY_CARE_PROVIDER_SITE_OTHER): Payer: Medicare HMO | Admitting: Primary Care

## 2019-10-30 VITALS — BP 124/76 | HR 67 | Temp 96.8°F | Ht 62.0 in | Wt 202.0 lb

## 2019-10-30 DIAGNOSIS — R7303 Prediabetes: Secondary | ICD-10-CM

## 2019-10-30 DIAGNOSIS — I5032 Chronic diastolic (congestive) heart failure: Secondary | ICD-10-CM

## 2019-10-30 DIAGNOSIS — M5136 Other intervertebral disc degeneration, lumbar region: Secondary | ICD-10-CM

## 2019-10-30 DIAGNOSIS — I10 Essential (primary) hypertension: Secondary | ICD-10-CM | POA: Diagnosis not present

## 2019-10-30 DIAGNOSIS — M170 Bilateral primary osteoarthritis of knee: Secondary | ICD-10-CM | POA: Diagnosis not present

## 2019-10-30 DIAGNOSIS — I480 Paroxysmal atrial fibrillation: Secondary | ICD-10-CM

## 2019-10-30 DIAGNOSIS — F5104 Psychophysiologic insomnia: Secondary | ICD-10-CM

## 2019-10-30 DIAGNOSIS — R251 Tremor, unspecified: Secondary | ICD-10-CM

## 2019-10-30 DIAGNOSIS — R69 Illness, unspecified: Secondary | ICD-10-CM | POA: Diagnosis not present

## 2019-10-30 DIAGNOSIS — F325 Major depressive disorder, single episode, in full remission: Secondary | ICD-10-CM

## 2019-10-30 DIAGNOSIS — M51369 Other intervertebral disc degeneration, lumbar region without mention of lumbar back pain or lower extremity pain: Secondary | ICD-10-CM

## 2019-10-30 DIAGNOSIS — Z23 Encounter for immunization: Secondary | ICD-10-CM

## 2019-10-30 DIAGNOSIS — D0511 Intraductal carcinoma in situ of right breast: Secondary | ICD-10-CM

## 2019-10-30 DIAGNOSIS — F419 Anxiety disorder, unspecified: Secondary | ICD-10-CM

## 2019-10-30 DIAGNOSIS — E7800 Pure hypercholesterolemia, unspecified: Secondary | ICD-10-CM | POA: Diagnosis not present

## 2019-10-30 MED ORDER — ZOSTER VAC RECOMB ADJUVANTED 50 MCG/0.5ML IM SUSR: 0.5000 mL | Freq: Once | INTRAMUSCULAR | 0 refills | Status: AC

## 2019-10-30 NOTE — Assessment & Plan Note (Signed)
Mammogram up-to-date, continue regular screenings.

## 2019-10-30 NOTE — Assessment & Plan Note (Signed)
Overall stable, continue duloxetine and tizanidine.

## 2019-10-30 NOTE — Progress Notes (Signed)
Subjective:    Patient ID: Selena Bush, female    DOB: [birth year], 74 y.o.   MRN: [id number]  HPI  This visit occurred during the SARS-CoV-2 public health emergency.  Safety protocols were in place, including screening questions prior to the visit, additional usage of staff PPE, and extensive cleaning of exam room while observing appropriate contact time as indicated for disinfecting solutions.   Selena Bush is a 74 year old female who presents today for follow up of chronic conditions.  1) Hypertension/CHF/CAD/MI/Paroxysmal Atrial Fibrillation/OSA: Following with cardiology, last visit in November 2020. No changes made to regimen.  Managed on diltiazem, furosemide, metoprolol tartrate 25 mg BID, potassium chloride 20 mEq, sotalol 120 mg. No longer on Xarelto due to hemorrhagic pericardial effusion. Echo from October 2019 with normal LV function, mildly dilated ascending aorta without effusion. Due for follow up visit in April 2021.  LDL of 145 in March 2020, statin intolerant, could not tolerate Zetia. She is weight daily, no abrupt changes in weight.   Wt Readings from Last 3 Encounters:  10/30/19 202 lb (91.6 kg)  07/29/19 205 lb 8 oz (93.2 kg)  06/17/19 206 lb 9.6 oz (93.7 kg)     2) Tremor: Evaluated in December 2020, patient requested neurology evaluation. Evaluated by neurology who checked labs including vitamin D and B12. Plan is for Primidone if no improvement. The patient had already resumed her gabapentin. She has started taking Vitmain D3 and Fish Oil. She was prescribed B12 injections but has yet to get started.   Overall the tremor is about the same. She has an appointment in April/May 2021.   3)  MDD/Anxiety/Insomnia: Currently managed on duloxetine 60 mg, gabapentin 100 mg TID. Following with therapy and doing well.   4) Osteoarthritis/DDD: Managed on duloxetine 60 mg, tizanidine 8 mg for which she takes every night before bed.    5) Prediabetes: A1C of 6.0 in March  2020. No recent A1C.   BP Readings from Last 3 Encounters:  10/30/19 124/76  07/29/19 122/80  06/17/19 122/70      Review of Systems  Eyes: Negative for visual disturbance.  Respiratory: Negative for shortness of breath.   Cardiovascular: Negative for chest pain.  Neurological: Negative for dizziness and headaches.       Past Medical History:  Diagnosis Date  . Anxiety   . Atrial fibrillation ([place])   . Chronic diastolic CHF (congestive heart failure) ([place])    a. echo 09/2014: EF [id number], diastolic dysfunction, mild LVH, nl RV size & systolic function, mildly dilated LA (4.3 cm), mild MR/TR, mildly elevated PASP 36.7 mm Hg  . DDD (degenerative disc disease), cervical   . DDD (degenerative disc disease), lumbar   . Depression   . Diffuse cystic mastopathy 2014  . Dyspnea   . GERD (gastroesophageal reflux disease)   . Gout   . Headache    rare  . HLD (hyperlipidemia)    a. statin intolerant 2/2 myalgias  . Hypercholesterolemia   . Hypertension   . Malignant neoplasm of upper-outer quadrant of female breast ([place]) 10/2012   Papillary DCIS, sentinel node negative. DR/PR positive. PARTIAL RIGHT MASTECTOMY FOR BREAST CANCER--HAD RADIATION - NO CHEMO --DR. Alpine ONCOLOGIST  . Obesity   . OSA on CPAP   . Osteoarthritis of both knees    a. s/p right TKA 04/2013 & left TKA 09/2014  . Otitis externa   . Personal history of radiation therapy 2015   RIGHT breast-mammosite per pt  .  Sleep difficulties    LUNESTA HAS HELPED  . Vaginal cyst      Social History   Socioeconomic History  . Marital status: Widowed    Spouse name: Chrissie Noa  . Number of children: 2  . Years of education: College  . Highest education level: Bachelor's degree (e.g., BA, AB, BS)  Occupational History  . Occupation: Retired  Tobacco Use  . Smoking status: Never Smoker  . Smokeless tobacco: Never Used  . Tobacco comment: social smoker as a teen  Substance and Sexual Activity  . Alcohol  use: No  . Drug use: No  . Sexual activity: Not Currently    Birth control/protection: Post-menopausal, Surgical  Other Topics Concern  . Not on file  Social History Narrative   Marital status: widowed since 09/16/2015      Children: 2 children (27, 50); 5 grandchild (4 in [place]; 1 in [place]).      Lives: alone in townhome; 1 dog, 2 cats      Employment: psychiatric Education officer, museum; retired in 2015      Tobacco: teenager only      Alcohol:  None      Drugs: none      Exercise: walking in 2019; more active in 2019.  Walking daily small amounts.        ADLs: drives; independent with ADLs; no assistant devices      Advanced Directives: none; FULL CODE; no prolonged measures.   Does not need them.  Daughter/Heather Vista Deck is HCPOA.    Social Determinants of Health   Financial Resource Strain:   . Difficulty of Paying Living Expenses:   Food Insecurity:   . Worried About Charity fundraiser in the Last Year:   . Arboriculturist in the Last Year:   Transportation Needs:   . Film/video editor (Medical):   [name] Lack of Transportation (Non-Medical):   Physical Activity:   . Days of Exercise per Week:   . Minutes of Exercise per Session:   Stress:   . Feeling of Stress :   Social Connections:   . Frequency of Communication with Friends and Family:   . Frequency of Social Gatherings with Friends and Family:   . Attends Religious Services:   . Active Member of Clubs or Organizations:   . Attends Archivist Meetings:   [name] Marital Status:   Intimate Partner Violence:   . Fear of Current or Ex-Partner:   . Emotionally Abused:   [name] Physically Abused:   . Sexually Abused:     Past Surgical History:  Procedure Laterality Date  . ABDOMINAL HYSTERECTOMY  1992   DUB; fibroids; endometriosis.  One remaining ovary.    [name] BREAST LUMPECTOMY Right 2015   Papillary DCIS, sentinel node negative. DR/PR positive. PARTIAL RIGHT MASTECTOMY FOR BREAST CANCER--HAD RADIATION - NO CHEMO --DR.  Brooks ONCOLOGIST  . BREAST SURGERY Right March 2014   Wide excision,APB RT 10 mm papillary DCIS, ER/PR positive. Sentinel node negative. Partial breast radiation.  [name] CATARACT EXTRACTION, BILATERAL  02/13/2016   [place].  . CHOLECYSTECTOMY  1994  . COLONOSCOPY  2015   1 benign polyp-every 5 years/ Dr Candace Cruise  . ERCP  1995  . JOINT REPLACEMENT Right Sept 2014   knee  . PERICARDIOCENTESIS N/A 11/13/2017   Procedure: PERICARDIOCENTESIS;  Surgeon: Nelva Bush, MD;  Location: [facility];  Service: Cardiovascular;  Laterality: N/A;  . TOTAL KNEE ARTHROPLASTY Right 04/16/2013   Procedure: RIGHT  TOTAL KNEE ARTHROPLASTY;  Surgeon: Mauri Pole, MD;  Location: [facility];  Service: Orthopedics;  Laterality: Right;  . TOTAL KNEE ARTHROPLASTY Left 09/15/2014   Procedure: LEFT TOTAL KNEE ARTHROPLASTY;  Surgeon: Mauri Pole, MD;  Location: [facility];  Service: Orthopedics;  Laterality: Left;  . TUBAL LIGATION  1979    Family History  Problem Relation Age of Onset  . Colon cancer Mother   . Cancer Mother 81       colon cancer  . Melanoma Father   . Cancer Father 21       melanoma  . Colon cancer Maternal Grandfather   . Breast cancer Maternal Grandfather     Allergies  Allergen Reactions  . Penicillins Anaphylaxis    anaphylaxis  Has patient had a PCN reaction causing immediate rash, facial/tongue/throat swelling, SOB or lightheadedness with hypotension: Yes Has patient had a PCN reaction causing severe rash involving mucus membranes or skin necrosis: No Has patient had a PCN reaction that required hospitalization: No Has patient had a PCN reaction occurring within the last 10 years: No If all of the above answers are "NO", then may proceed with Cephalosporin use.   . Sulfa Antibiotics Anaphylaxis  . Codeine Other (See Comments) and Nausea And Vomiting    Gi problems   . Statins Other (See Comments)    Leg pains  . Aspirin Other (See Comments)    "burned my stomach  intensely" Abdominal pain and burning    Current Outpatient Medications on File Prior to Visit  Medication Sig Dispense Refill  . diltiazem (CARDIZEM CD) 180 MG 24 hr capsule Take 1 capsule (180 mg total) by mouth daily. 90 capsule 3  . DULoxetine (CYMBALTA) 60 MG capsule TAKE 1 CAPSULE BY MOUTH 2 TIMES A DAY for anxiety and depression. 60 capsule 0  . fluticasone (FLONASE) 50 MCG/ACT nasal spray Place 2 sprays into both nostrils daily as needed for rhinitis or allergies. 16 g 5  . furosemide (LASIX) 40 MG tablet Take 0.5 tablets (20 mg total) by mouth daily. 45 tablet 3  . gabapentin (NEURONTIN) 100 MG capsule TAKE 1 CAPSULE BY MOUTH THREE TIMES A DAY for anxiety. 90 capsule 0  . metoprolol tartrate (LOPRESSOR) 25 MG tablet TAKE 3 TABLETS BY MOUTH 2 TIMES DAILY. 540 tablet 3  . potassium chloride SA (KLOR-CON M20) 20 MEQ tablet Take 1 tablet (20 mEq total) by mouth daily. 90 tablet 3  . sotalol (BETAPACE) 120 MG tablet Take 1 tablet (120 mg total) by mouth every 12 (twelve) hours. 180 tablet 3  . tiZANidine (ZANAFLEX) 4 MG tablet TAKE 2 TABLET (4 MG TOTAL) BY MOUTH DAILY FOR MUSCLE SPASMS AT BEDTIME 60 tablet 0   No current facility-administered medications on file prior to visit.    BP 124/76   Pulse 67   Temp (!) 96.8 F (36 C) (Temporal)   Ht 5\' 2"  (1.575 m)   Wt 202 lb (91.6 kg)   SpO2 98%   BMI 36.95 kg/m    Objective:   Physical Exam  Constitutional: She appears well-nourished.  Cardiovascular: Normal rate and regular rhythm.  Respiratory: Effort normal and breath sounds normal.  Musculoskeletal:     Cervical back: Neck supple.  Skin: Skin is warm and dry.  Psychiatric: She has a normal mood and affect.           Assessment & Plan:

## 2019-10-30 NOTE — Assessment & Plan Note (Signed)
Well controlled in the office today, continue current regimen. CMP pending. 

## 2019-10-30 NOTE — Assessment & Plan Note (Addendum)
Regular rate and rhythm today. Following with cardiology.  No longer on Xarelto due to hemorrhagic pericardial effusion history.  Continue sotalol, diltiazem, and metoprolol.

## 2019-10-30 NOTE — Assessment & Plan Note (Signed)
Doing well on duloxetine, continue same.

## 2019-10-30 NOTE — Assessment & Plan Note (Signed)
Doing well on duloxetine and gabapentin.  Continue same.

## 2019-10-30 NOTE — Assessment & Plan Note (Signed)
Appears euvolemic today. Compliant with daily weights, no abrupt changes.  Echo from 2019 reviewed. Continue beta-blocker, furosemide.

## 2019-10-30 NOTE — Assessment & Plan Note (Signed)
Repeat A1c pending.  Encouraged a healthy diet and regular exercise.

## 2019-10-30 NOTE — Assessment & Plan Note (Signed)
Doing well on tizanidine 8 mg nightly, continue same.  Continue duloxetine.

## 2019-10-30 NOTE — Assessment & Plan Note (Signed)
Continued, overall about the same. Neurology follow-up due in April/May this year.

## 2019-10-30 NOTE — Patient Instructions (Signed)
Stop by the lab prior to leaving today. I will notify you of your results once received.   Talk to Dr. Stanford Breed about an injectable medication for cholesterol.  It was a pleasure to see you today!

## 2019-10-30 NOTE — Assessment & Plan Note (Signed)
Denies concerns today, continue gabapentin and duloxetine.

## 2019-10-30 NOTE — Assessment & Plan Note (Signed)
Intolerant to statins and Zetia. Question whether she may be a candidate for injectable treatment.  I recommended she bring this up to her cardiologist during her next visit next month.  Lipid panel pending.

## 2019-10-31 LAB — CBC
HCT: 42.5 % (ref 36.0–46.0)
Hemoglobin: 14.2 g/dL (ref 12.0–15.0)
MCHC: 33.3 g/dL (ref 30.0–36.0)
MCV: 92.2 fl (ref 78.0–100.0)
Platelets: 248 10*3/uL (ref 150.0–400.0)
RBC: 4.61 Mil/uL (ref 3.87–5.11)
RDW: 13.5 % (ref 11.5–15.5)
WBC: 6.5 10*3/uL (ref 4.0–10.5)

## 2019-10-31 LAB — COMPREHENSIVE METABOLIC PANEL
ALT: 11 U/L (ref 0–35)
AST: 17 U/L (ref 0–37)
Albumin: 4 g/dL (ref 3.5–5.2)
Alkaline Phosphatase: 92 U/L (ref 39–117)
BUN: 17 mg/dL (ref 6–23)
CO2: 31 mEq/L (ref 19–32)
Calcium: 9.8 mg/dL (ref 8.4–10.5)
Chloride: 103 mEq/L (ref 96–112)
Creatinine, Ser: 1.22 mg/dL — ABNORMAL HIGH (ref 0.40–1.20)
GFR: 43.39 mL/min — ABNORMAL LOW (ref >60.00)
Glucose, Bld: 118 mg/dL — ABNORMAL HIGH (ref 70–99)
Potassium: 3.9 mEq/L (ref 3.5–5.1)
Sodium: 141 mEq/L (ref 135–145)
Total Bilirubin: 0.4 mg/dL (ref 0.2–1.2)
Total Protein: 6.8 g/dL (ref 6.0–8.3)

## 2019-10-31 LAB — HEMOGLOBIN A1C: Hgb A1c MFr Bld: 5.6 % (ref 4.6–6.5)

## 2019-10-31 LAB — LIPID PANEL
Cholesterol: 221 mg/dL — ABNORMAL HIGH (ref 0–200)
HDL: 54.4 mg/dL (ref >39.00)
LDL Cholesterol: 140 mg/dL — ABNORMAL HIGH (ref 0–99)
NonHDL: 166.35
Total CHOL/HDL Ratio: 4
Triglycerides: 130 mg/dL (ref 0.0–149.0)
VLDL: 26 mg/dL (ref 0.0–40.0)

## 2019-11-11 ENCOUNTER — Ambulatory Visit
Admission: RE | Admit: 2019-11-11 | Discharge: 2019-11-11 | Disposition: A | Discharge status: Home / Self Care | Payer: Medicare HMO | Source: Ambulatory Visit | Attending: General Surgery | Admitting: General Surgery

## 2019-11-11 DIAGNOSIS — D0510 Intraductal carcinoma in situ of unspecified breast: Secondary | ICD-10-CM

## 2019-11-11 DIAGNOSIS — Z1231 Encounter for screening mammogram for malignant neoplasm of breast: Secondary | ICD-10-CM | POA: Diagnosis not present

## 2019-11-12 ENCOUNTER — Telehealth: Payer: Self-pay

## 2019-11-12 DIAGNOSIS — F419 Anxiety disorder, unspecified: Secondary | ICD-10-CM

## 2019-11-12 DIAGNOSIS — F324 Major depressive disorder, single episode, in partial remission: Secondary | ICD-10-CM

## 2019-11-12 NOTE — Telephone Encounter (Signed)
Patient would like a refill on gabapentin and duloxetine. She uses [facility].   Thank you,  [name], PharmD Clinical Pharmacist [facility] at [facility] [phone]

## 2019-11-13 ENCOUNTER — Telehealth: Payer: Self-pay

## 2019-11-13 DIAGNOSIS — R69 Illness, unspecified: Secondary | ICD-10-CM | POA: Diagnosis not present

## 2019-11-13 DIAGNOSIS — F4312 Post-traumatic stress disorder, chronic: Secondary | ICD-10-CM | POA: Diagnosis not present

## 2019-11-13 MED ORDER — DULOXETINE HCL 60 MG PO CPEP: ORAL_CAPSULE | ORAL | 1 refills | Status: DC

## 2019-11-13 MED ORDER — GABAPENTIN 100 MG PO CAPS: ORAL_CAPSULE | ORAL | 1 refills | Status: DC

## 2019-11-13 NOTE — Telephone Encounter (Signed)
Last seen on 10/30/2019 for follow up. Will refill as requested.

## 2019-11-13 NOTE — Chronic Care Management (AMB) (Signed)
Chronic Care Management Pharmacy  Name: Selena Bush  MRN: [id number] DOB: [birth year]  Chief Complaint/ HPI  [name],  74 y.o., female contacted for pharmacy coordination and delivery.   PCP : Pleas Koch, NP  Their chronic conditions include: HTN, HLD, CHF, GERD, OA, AFIB, obesity, OSA, MDD, anxiety  Patient concerns: none, doing well with adherence packaging and delivery  Office Visits: 10/30/19: Carlis Abbott - no medication changes   Allergies  Allergen Reactions  . Penicillins Anaphylaxis    anaphylaxis  Has patient had a PCN reaction causing immediate rash, facial/tongue/throat swelling, SOB or lightheadedness with hypotension: Yes Has patient had a PCN reaction causing severe rash involving mucus membranes or skin necrosis: No Has patient had a PCN reaction that required hospitalization: No Has patient had a PCN reaction occurring within the last 10 years: No If all of the above answers are "NO", then may proceed with Cephalosporin use.   . Sulfa Antibiotics Anaphylaxis  . Codeine Other (See Comments) and Nausea And Vomiting    Gi problems   . Statins Other (See Comments)    Leg pains  . Aspirin Other (See Comments)    "burned my stomach intensely" Abdominal pain and burning    Medications: Outpatient Encounter Medications as of 11/13/2019  Medication Sig  . diltiazem (CARDIZEM CD) 180 MG 24 hr capsule Take 1 capsule (180 mg total) by mouth daily.  . DULoxetine (CYMBALTA) 60 MG capsule TAKE 1 CAPSULE BY MOUTH 2 TIMES A DAY for anxiety and depression.  . fluticasone (FLONASE) 50 MCG/ACT nasal spray Place 2 sprays into both nostrils daily as needed for rhinitis or allergies.  . furosemide (LASIX) 40 MG tablet Take 0.5 tablets (20 mg total) by mouth daily.  [name] gabapentin (NEURONTIN) 100 MG capsule TAKE 1 CAPSULE BY MOUTH THREE TIMES A DAY for anxiety.  . metoprolol tartrate (LOPRESSOR) 25 MG tablet TAKE 3 TABLETS BY MOUTH 2 TIMES DAILY.  [name] potassium  chloride SA (KLOR-CON M20) 20 MEQ tablet Take 1 tablet (20 mEq total) by mouth daily.  . sotalol (BETAPACE) 120 MG tablet Take 1 tablet (120 mg total) by mouth every 12 (twelve) hours.  [name] tiZANidine (ZANAFLEX) 4 MG tablet TAKE 2 TABLET (4 MG TOTAL) BY MOUTH DAILY FOR MUSCLE SPASMS AT BEDTIME   No facility-administered encounter medications on file as of 11/13/2019.    Current Diagnosis/Assessment: Goals    . Pharmacy Care Plan     Current Barriers:  . Chronic Disease Management support, education, and care coordination needs related to hypertension, hyperlipidemia, congestive heart failure, GERD, osteoarthritis, AFIB, major depressive disorder, anxiety  Pharmacist Clinical Goal(s):  [name] Pharmacist will review medication safety every 6 months . Maintain blood pressure within goal of < 140/90 mmHg . Maintain average blood glucose (A1c) < 7% . Increase exercise slowly with goal of 150 minutes of moderate activity (such as brisk walking) each week  Interventions: . Comprehensive medication review performed. [facility] pharmacy packaging and delivery services  Patient Self Care Activities:  . Self administers medications as prescribed  Initial goal documentation       Heart Failure   Type: Diastolic Last ejection fraction: 05/2018  65-70% NYHA Class: I (no activity limitation) AHA HF Stage: B (Heart disease present - no symptoms present)  Patient has failed these meds in past: none  Patient is currently controlled on the following medications:   Metoprolol tartrate 25 mg - 3 tablets twice daily  Furosemide 40 mg - one-half tablet  daily  Potassium Cl SA 20 mEq - 1 tablet daily We discussed: continues to deny swelling or shortness of breath   Plan: Continue current medications  MDD/Anxiety/Insomnia   Patient has failed these meds in past: sertraline (possible SIADH), suvorexant (unknown) Patient is currently controlled on the following medications:   Duloxetine 60 mg -  1 capsule twice daily   Gabapentin 100 mg - 1 capsule TID  We discussed:  Sleeping well, mood is controlled; still planning to follow up with neurology for tremor   Plan: Continue current medications   Vaccines   Reviewed and discussed patient's vaccination history.    Immunization History  Administered Date(s) Administered  . Fluad Quad(high Dose 65+) 06/11/2019  . Influenza, High Dose Seasonal PF 08/10/2014, 05/05/2015, 07/30/2018  . Influenza,inj,Quad PF,6+ Mos 06/07/2016  . PFIZER SARS-COV-2 Vaccination 09/03/2019, 09/24/2019  . Pneumococcal Conjugate-13 02/06/2016  . Pneumococcal Polysaccharide-23 09/22/2014  . Td 09/01/2018  . Zoster Recombinat (Shingrix) 10/19/2018   Plan: Recommended patient receive 2nd dose of Shingrix 2-6 months after first dose on 10/19/19.   Medication Management Requested refills for adherence packaging delivery on 11/15/19; pt is doing well with medication packaging and no adherence concerns.   Misc: B12 injections from neuro office  OTCs: flonase 50 mcg/act, vitamin D3 1000 IU - 2 caps daily  Pharmacy: [facility]  Adherence: uses adherence packaging, timely refills  Social support: lots of family help, church  Affordability: no concerns   CCM Follow Up:  Monday, March 23, 2020 3:30 PM (telephone)  [name], PharmD Clinical Pharmacist [facility] [phone]

## 2019-11-26 DIAGNOSIS — D0511 Intraductal carcinoma in situ of right breast: Secondary | ICD-10-CM | POA: Diagnosis not present

## 2019-12-05 DIAGNOSIS — R69 Illness, unspecified: Secondary | ICD-10-CM | POA: Diagnosis not present

## 2019-12-05 DIAGNOSIS — F4312 Post-traumatic stress disorder, chronic: Secondary | ICD-10-CM | POA: Diagnosis not present

## 2019-12-09 DIAGNOSIS — R251 Tremor, unspecified: Secondary | ICD-10-CM | POA: Diagnosis not present

## 2019-12-09 DIAGNOSIS — R29898 Other symptoms and signs involving the musculoskeletal system: Secondary | ICD-10-CM | POA: Diagnosis not present

## 2019-12-11 ENCOUNTER — Ambulatory Visit: Payer: Self-pay

## 2019-12-11 DIAGNOSIS — F325 Major depressive disorder, single episode, in full remission: Secondary | ICD-10-CM

## 2019-12-11 DIAGNOSIS — I5032 Chronic diastolic (congestive) heart failure: Secondary | ICD-10-CM

## 2019-12-11 NOTE — Chronic Care Management (AMB) (Addendum)
Monthly medication coordination call:  Patient denies concerns today Reviewed chart for medication changes, none identified   Recent visits:  12/09/19: Neurology - tremor, schedule B12 injections every 3 months, order in home PT for left arm weakness, order nerve test to [facility] out nerve damage left arm, cont current meds, rtc 3-4 months  BP Readings from Last 3 Encounters:  10/30/19 124/76  07/29/19 122/80  06/17/19 122/70    Lab Results  Component Value Date   HGBA1C 5.6 10/30/2019    BP controlled, Pre-DM stable  Patient obtains medications through Adherence Packaging  30 Days   Patient is due for next adherence delivery on: 01/15/2020  Called patient and reviewed medications and coordinated delivery.  This delivery to include: . Sotalol 120 mg - 1 BF, 1 evening meal . Diltiazem 180 mg - 1 BF . Metoprolol tartrate 25 mg - 3 BF, 3 Evening meal  . Furosemide 40 mg -  BF . Potassium SA 20 mEq - 1 BF . Duloxetine 60 mg - 1 BF, 1 evening meal . Gabapentin 100 mg - 1 BF, 1 evening meal, 1 bedtime . Tizanidine 4 mg - 2 tablets at bedtime  Confirmed delivery date of 12/16/19, pharmacy will contact patient the morning of delivery.   [name], PharmD Clinical Pharmacist [facility] [phone]

## 2019-12-15 DIAGNOSIS — E785 Hyperlipidemia, unspecified: Secondary | ICD-10-CM | POA: Diagnosis not present

## 2019-12-15 DIAGNOSIS — R69 Illness, unspecified: Secondary | ICD-10-CM | POA: Diagnosis not present

## 2019-12-15 DIAGNOSIS — I110 Hypertensive heart disease with heart failure: Secondary | ICD-10-CM | POA: Diagnosis not present

## 2019-12-15 DIAGNOSIS — K219 Gastro-esophageal reflux disease without esophagitis: Secondary | ICD-10-CM | POA: Diagnosis not present

## 2019-12-15 DIAGNOSIS — I509 Heart failure, unspecified: Secondary | ICD-10-CM | POA: Diagnosis not present

## 2019-12-15 DIAGNOSIS — D649 Anemia, unspecified: Secondary | ICD-10-CM | POA: Diagnosis not present

## 2019-12-15 DIAGNOSIS — E538 Deficiency of other specified B group vitamins: Secondary | ICD-10-CM | POA: Diagnosis not present

## 2019-12-15 DIAGNOSIS — R531 Weakness: Secondary | ICD-10-CM | POA: Diagnosis not present

## 2019-12-15 DIAGNOSIS — R251 Tremor, unspecified: Secondary | ICD-10-CM | POA: Diagnosis not present

## 2019-12-16 DIAGNOSIS — E538 Deficiency of other specified B group vitamins: Secondary | ICD-10-CM | POA: Diagnosis not present

## 2019-12-24 DIAGNOSIS — E785 Hyperlipidemia, unspecified: Secondary | ICD-10-CM | POA: Diagnosis not present

## 2019-12-24 DIAGNOSIS — R29898 Other symptoms and signs involving the musculoskeletal system: Secondary | ICD-10-CM | POA: Diagnosis not present

## 2019-12-24 DIAGNOSIS — R69 Illness, unspecified: Secondary | ICD-10-CM | POA: Diagnosis not present

## 2019-12-24 DIAGNOSIS — R251 Tremor, unspecified: Secondary | ICD-10-CM | POA: Diagnosis not present

## 2019-12-24 DIAGNOSIS — R531 Weakness: Secondary | ICD-10-CM | POA: Diagnosis not present

## 2019-12-24 DIAGNOSIS — I509 Heart failure, unspecified: Secondary | ICD-10-CM | POA: Diagnosis not present

## 2019-12-24 DIAGNOSIS — I110 Hypertensive heart disease with heart failure: Secondary | ICD-10-CM | POA: Diagnosis not present

## 2019-12-24 DIAGNOSIS — E538 Deficiency of other specified B group vitamins: Secondary | ICD-10-CM | POA: Diagnosis not present

## 2019-12-24 DIAGNOSIS — D649 Anemia, unspecified: Secondary | ICD-10-CM | POA: Diagnosis not present

## 2019-12-24 DIAGNOSIS — R200 Anesthesia of skin: Secondary | ICD-10-CM | POA: Diagnosis not present

## 2019-12-24 DIAGNOSIS — K219 Gastro-esophageal reflux disease without esophagitis: Secondary | ICD-10-CM | POA: Diagnosis not present

## 2019-12-25 DIAGNOSIS — D649 Anemia, unspecified: Secondary | ICD-10-CM | POA: Diagnosis not present

## 2019-12-25 DIAGNOSIS — R251 Tremor, unspecified: Secondary | ICD-10-CM | POA: Diagnosis not present

## 2019-12-25 DIAGNOSIS — E538 Deficiency of other specified B group vitamins: Secondary | ICD-10-CM | POA: Diagnosis not present

## 2019-12-25 DIAGNOSIS — I110 Hypertensive heart disease with heart failure: Secondary | ICD-10-CM | POA: Diagnosis not present

## 2019-12-25 DIAGNOSIS — I509 Heart failure, unspecified: Secondary | ICD-10-CM | POA: Diagnosis not present

## 2019-12-25 DIAGNOSIS — R69 Illness, unspecified: Secondary | ICD-10-CM | POA: Diagnosis not present

## 2019-12-25 DIAGNOSIS — R531 Weakness: Secondary | ICD-10-CM | POA: Diagnosis not present

## 2019-12-26 DIAGNOSIS — R531 Weakness: Secondary | ICD-10-CM | POA: Diagnosis not present

## 2019-12-26 DIAGNOSIS — I509 Heart failure, unspecified: Secondary | ICD-10-CM | POA: Diagnosis not present

## 2019-12-26 DIAGNOSIS — K219 Gastro-esophageal reflux disease without esophagitis: Secondary | ICD-10-CM | POA: Diagnosis not present

## 2019-12-26 DIAGNOSIS — E785 Hyperlipidemia, unspecified: Secondary | ICD-10-CM | POA: Diagnosis not present

## 2019-12-26 DIAGNOSIS — R251 Tremor, unspecified: Secondary | ICD-10-CM | POA: Diagnosis not present

## 2019-12-26 DIAGNOSIS — E538 Deficiency of other specified B group vitamins: Secondary | ICD-10-CM | POA: Diagnosis not present

## 2019-12-26 DIAGNOSIS — D649 Anemia, unspecified: Secondary | ICD-10-CM | POA: Diagnosis not present

## 2019-12-26 DIAGNOSIS — I110 Hypertensive heart disease with heart failure: Secondary | ICD-10-CM | POA: Diagnosis not present

## 2019-12-26 DIAGNOSIS — R69 Illness, unspecified: Secondary | ICD-10-CM | POA: Diagnosis not present

## 2019-12-27 DIAGNOSIS — R251 Tremor, unspecified: Secondary | ICD-10-CM | POA: Diagnosis not present

## 2019-12-27 DIAGNOSIS — R69 Illness, unspecified: Secondary | ICD-10-CM | POA: Diagnosis not present

## 2019-12-27 DIAGNOSIS — E785 Hyperlipidemia, unspecified: Secondary | ICD-10-CM | POA: Diagnosis not present

## 2019-12-27 DIAGNOSIS — K219 Gastro-esophageal reflux disease without esophagitis: Secondary | ICD-10-CM | POA: Diagnosis not present

## 2019-12-27 DIAGNOSIS — R531 Weakness: Secondary | ICD-10-CM | POA: Diagnosis not present

## 2019-12-27 DIAGNOSIS — I509 Heart failure, unspecified: Secondary | ICD-10-CM | POA: Diagnosis not present

## 2019-12-27 DIAGNOSIS — E538 Deficiency of other specified B group vitamins: Secondary | ICD-10-CM | POA: Diagnosis not present

## 2019-12-27 DIAGNOSIS — D649 Anemia, unspecified: Secondary | ICD-10-CM | POA: Diagnosis not present

## 2019-12-27 DIAGNOSIS — I110 Hypertensive heart disease with heart failure: Secondary | ICD-10-CM | POA: Diagnosis not present

## 2019-12-28 DIAGNOSIS — R200 Anesthesia of skin: Secondary | ICD-10-CM | POA: Insufficient documentation

## 2019-12-30 NOTE — Progress Notes (Signed)
HPI: Follow-up diastolic congestive heart failure, paroxysmal atrial fibrillation, pericardial effusion, hypertension and hyperlipidemia. Patient was admitted March 2019 with atypical chest pain and paroxysmal atrial fibrillation. She was treated with sotalol and Xarelto. However she returned with significant dyspnea and follow-up echocardiogram showed large pericardial effusion with tamponade physiology.She had pericardiocentesis on April 1. Cytology was negative. At that time we felt it was likely her initial presentation was pericarditis and she had a hemorrhagic pericardial effusion from initiation of anticoagulation. We felt her atrial fibrillation may improve once pericardial process resolved. Patient was admitted with pneumonia and recurrent atrial fibrilllationto an outside hospital. Follow-up echocardiogram April 2019 showed normal LV function, moderate left ventricular hypertrophy, mild tricuspid regurgitation, small pericardial effusion and small pleural effusion.  Most recent echocardiogram October 2019 showed normal LV function, mildly dilated ascending aorta and no pericardial effusion.  Since last seen  she denies dyspnea, chest pain, palpitations or syncope.  She feels as though she may have Parkinson's and is presently being evaluated.  Current Outpatient Medications  Medication Sig Dispense Refill  . diltiazem (CARDIZEM CD) 180 MG 24 hr capsule Take 1 capsule (180 mg total) by mouth daily. 90 capsule 3  . DULoxetine (CYMBALTA) 60 MG capsule TAKE 1 CAPSULE BY MOUTH 2 TIMES A DAY for anxiety and depression. 180 capsule 1  . fluticasone (FLONASE) 50 MCG/ACT nasal spray Place 2 sprays into both nostrils daily as needed for rhinitis or allergies. 16 g 5  . furosemide (LASIX) 40 MG tablet Take 0.5 tablets (20 mg total) by mouth daily. 45 tablet 3  . gabapentin (NEURONTIN) 100 MG capsule TAKE 1 CAPSULE BY MOUTH THREE TIMES A DAY for anxiety. 270 capsule 1  . metoprolol tartrate  (LOPRESSOR) 25 MG tablet TAKE 3 TABLETS BY MOUTH 2 TIMES DAILY. 540 tablet 3  . potassium chloride SA (KLOR-CON M20) 20 MEQ tablet Take 1 tablet (20 mEq total) by mouth daily. 90 tablet 3  . sotalol (BETAPACE) 120 MG tablet Take 1 tablet (120 mg total) by mouth every 12 (twelve) hours. 180 tablet 3  . tiZANidine (ZANAFLEX) 4 MG tablet TAKE 2 TABLET (4 MG TOTAL) BY MOUTH DAILY FOR MUSCLE SPASMS AT BEDTIME 60 tablet 0   No current facility-administered medications for this visit.     Past Medical History:  Diagnosis Date  . Anxiety   . Atrial fibrillation ([place])   . Chronic diastolic CHF (congestive heart failure) ([place])    a. echo 09/2014: EF [id number], diastolic dysfunction, mild LVH, nl RV size & systolic function, mildly dilated LA (4.3 cm), mild MR/TR, mildly elevated PASP 36.7 mm Hg  . DDD (degenerative disc disease), cervical   . DDD (degenerative disc disease), lumbar   . Depression   . Diffuse cystic mastopathy 2014  . Dyspnea   . GERD (gastroesophageal reflux disease)   . Gout   . Headache    rare  . HLD (hyperlipidemia)    a. statin intolerant 2/2 myalgias  . Hx of dysplastic nevus 12/25/2018   L medial ankle  . Hypercholesterolemia   . Hypertension   . Malignant neoplasm of upper-outer quadrant of female breast ([place]) 10/2012   Papillary DCIS, sentinel node negative. DR/PR positive. PARTIAL RIGHT MASTECTOMY FOR BREAST CANCER--HAD RADIATION - NO CHEMO --DR. [place] ONCOLOGIST  . Obesity   . OSA on CPAP   . Osteoarthritis of both knees    a. s/p right TKA 04/2013 & left TKA 09/2014  . Otitis externa   .  Personal history of radiation therapy 2015   RIGHT breast-mammosite per pt  . Sleep difficulties    LUNESTA HAS HELPED  . Vaginal cyst     Past Surgical History:  Procedure Laterality Date  . ABDOMINAL HYSTERECTOMY  1992   DUB; fibroids; endometriosis.  One remaining ovary.    [name] BREAST LUMPECTOMY Right 2015   Papillary DCIS, sentinel node negative. DR/PR  positive. PARTIAL RIGHT MASTECTOMY FOR BREAST CANCER--HAD RADIATION - NO CHEMO --DR. [place] ONCOLOGIST  . BREAST SURGERY Right March 2014   Wide excision,APB RT 10 mm papillary DCIS, ER/PR positive. Sentinel node negative. Partial breast radiation.  [name] CATARACT EXTRACTION, BILATERAL  02/13/2016   [place].  . CHOLECYSTECTOMY  1994  . COLONOSCOPY  2015   1 benign polyp-every 5 years/ Dr Candace Cruise  . ERCP  1995  . JOINT REPLACEMENT Right Sept 2014   knee  . PERICARDIOCENTESIS N/A 11/13/2017   Procedure: PERICARDIOCENTESIS;  Surgeon: Nelva Bush, MD;  Location: [facility];  Service: Cardiovascular;  Laterality: N/A;  . TOTAL KNEE ARTHROPLASTY Right 04/16/2013   Procedure: RIGHT TOTAL KNEE ARTHROPLASTY;  Surgeon: Mauri Pole, MD;  Location: [facility];  Service: Orthopedics;  Laterality: Right;  . TOTAL KNEE ARTHROPLASTY Left 09/15/2014   Procedure: LEFT TOTAL KNEE ARTHROPLASTY;  Surgeon: Mauri Pole, MD;  Location: [facility];  Service: Orthopedics;  Laterality: Left;  . TUBAL LIGATION  1979    Social History   Socioeconomic History  . Marital status: Widowed    Spouse name: Chrissie Noa  . Number of children: 2  . Years of education: College  . Highest education level: Bachelor's degree (e.g., BA, AB, BS)  Occupational History  . Occupation: Retired  Tobacco Use  . Smoking status: Never Smoker  . Smokeless tobacco: Never Used  . Tobacco comment: social smoker as a teen  Substance and Sexual Activity  . Alcohol use: No  . Drug use: No  . Sexual activity: Not Currently    Birth control/protection: Post-menopausal, Surgical  Other Topics Concern  . Not on file  Social History Narrative   Marital status: widowed since 09/16/2015      Children: 2 children (74, 36); 5 grandchild (4 in [place]; 1 in [place]).      Lives: alone in townhome; 1 dog, 2 cats      Employment: psychiatric Education officer, museum; retired in 2015      Tobacco: teenager only      Alcohol:  None      Drugs: none       Exercise: walking in 2019; more active in 2019.  Walking daily small amounts.        ADLs: drives; independent with ADLs; no assistant devices      Advanced Directives: none; FULL CODE; no prolonged measures.   Does not need them.  Daughter/Heather Vista Deck is HCPOA.    Social Determinants of Health   Financial Resource Strain:   . Difficulty of Paying Living Expenses:   Food Insecurity:   . Worried About Charity fundraiser in the Last Year:   . Arboriculturist in the Last Year:   Transportation Needs:   . Film/video editor (Medical):   [name] Lack of Transportation (Non-Medical):   Physical Activity:   . Days of Exercise per Week:   . Minutes of Exercise per Session:   Stress:   . Feeling of Stress :   Social Connections:   . Frequency of Communication with Friends and Family:   .  Frequency of Social Gatherings with Friends and Family:   . Attends Religious Services:   . Active Member of Clubs or Organizations:   . Attends Archivist Meetings:   [name] Marital Status:   Intimate Partner Violence:   . Fear of Current or Ex-Partner:   . Emotionally Abused:   [name] Physically Abused:   . Sexually Abused:     Family History  Problem Relation Age of Onset  . Colon cancer Mother   . Cancer Mother 20       colon cancer  . Melanoma Father   . Cancer Father 33       melanoma  . Colon cancer Maternal Grandfather   . Breast cancer Maternal Grandfather     ROS: no fevers or chills, productive cough, hemoptysis, dysphasia, odynophagia, melena, hematochezia, dysuria, hematuria, rash, seizure activity, orthopnea, PND, pedal edema, claudication. Remaining systems are negative.  Physical Exam: Well-developed well-nourished in no acute distress.  Skin is warm and dry.  HEENT is normal.  Neck is supple.  Chest is clear to auscultation with normal expansion.  Cardiovascular exam is regular rate and rhythm.  Abdominal exam nontender or distended. No masses palpated. Extremities  show no edema. neuro resting left tremor  ECG-sinus rhythm at a rate of 55, left ventricular hypertrophy with repolarization abnormality.  Personally reviewed  A/P  1 paroxysmal atrial fibrillation-currently remains in sinus rhythm.  We felt her previous episode was likely secondary to pericarditis/pneumonia.  We did tret with Xarelto which resulted in hemorrhagic pericardial effusion.  We have therefore not anticoagulated since then and she would not be willing to resume.  She understands the higher risk of CVA if atrial fibrillation recurs.  Given that her atrial fibrillation was likely secondary to pericarditis I discussed potentially discontinuing sotalol.  However she would like to continue for now.  We will also continue Cardizem and beta-blocker.  2 hypertension-blood pressure controlled.  Continue present medications.  3 history of hemorrhagic pericardial effusion-resolved on most recent echocardiogram.  4 chronic diastolic congestive heart failure-patient is euvolemic today.  Continue present dose of diuretic.   [name], MD

## 2019-12-31 ENCOUNTER — Ambulatory Visit: Payer: Medicare HMO | Admitting: Dermatology

## 2020-01-02 DIAGNOSIS — R69 Illness, unspecified: Secondary | ICD-10-CM | POA: Diagnosis not present

## 2020-01-02 DIAGNOSIS — F4312 Post-traumatic stress disorder, chronic: Secondary | ICD-10-CM | POA: Diagnosis not present

## 2020-01-03 DIAGNOSIS — E785 Hyperlipidemia, unspecified: Secondary | ICD-10-CM | POA: Diagnosis not present

## 2020-01-03 DIAGNOSIS — K219 Gastro-esophageal reflux disease without esophagitis: Secondary | ICD-10-CM | POA: Diagnosis not present

## 2020-01-03 DIAGNOSIS — I110 Hypertensive heart disease with heart failure: Secondary | ICD-10-CM | POA: Diagnosis not present

## 2020-01-03 DIAGNOSIS — R251 Tremor, unspecified: Secondary | ICD-10-CM | POA: Diagnosis not present

## 2020-01-03 DIAGNOSIS — E538 Deficiency of other specified B group vitamins: Secondary | ICD-10-CM | POA: Diagnosis not present

## 2020-01-03 DIAGNOSIS — D649 Anemia, unspecified: Secondary | ICD-10-CM | POA: Diagnosis not present

## 2020-01-03 DIAGNOSIS — I509 Heart failure, unspecified: Secondary | ICD-10-CM | POA: Diagnosis not present

## 2020-01-03 DIAGNOSIS — R531 Weakness: Secondary | ICD-10-CM | POA: Diagnosis not present

## 2020-01-03 DIAGNOSIS — R69 Illness, unspecified: Secondary | ICD-10-CM | POA: Diagnosis not present

## 2020-01-06 ENCOUNTER — Encounter: Payer: Self-pay | Admitting: Cardiology

## 2020-01-06 ENCOUNTER — Other Ambulatory Visit: Payer: Self-pay

## 2020-01-06 ENCOUNTER — Ambulatory Visit: Payer: Medicare HMO | Admitting: Cardiology

## 2020-01-06 VITALS — BP 150/70 | HR 55 | Ht 62.0 in | Wt 202.2 lb

## 2020-01-06 DIAGNOSIS — I5032 Chronic diastolic (congestive) heart failure: Secondary | ICD-10-CM

## 2020-01-06 DIAGNOSIS — I10 Essential (primary) hypertension: Secondary | ICD-10-CM

## 2020-01-06 DIAGNOSIS — K219 Gastro-esophageal reflux disease without esophagitis: Secondary | ICD-10-CM | POA: Diagnosis not present

## 2020-01-06 DIAGNOSIS — R251 Tremor, unspecified: Secondary | ICD-10-CM | POA: Diagnosis not present

## 2020-01-06 DIAGNOSIS — R531 Weakness: Secondary | ICD-10-CM | POA: Diagnosis not present

## 2020-01-06 DIAGNOSIS — I509 Heart failure, unspecified: Secondary | ICD-10-CM | POA: Diagnosis not present

## 2020-01-06 DIAGNOSIS — I480 Paroxysmal atrial fibrillation: Secondary | ICD-10-CM | POA: Diagnosis not present

## 2020-01-06 DIAGNOSIS — E785 Hyperlipidemia, unspecified: Secondary | ICD-10-CM | POA: Diagnosis not present

## 2020-01-06 DIAGNOSIS — E538 Deficiency of other specified B group vitamins: Secondary | ICD-10-CM | POA: Diagnosis not present

## 2020-01-06 DIAGNOSIS — R69 Illness, unspecified: Secondary | ICD-10-CM | POA: Diagnosis not present

## 2020-01-06 DIAGNOSIS — I110 Hypertensive heart disease with heart failure: Secondary | ICD-10-CM | POA: Diagnosis not present

## 2020-01-06 DIAGNOSIS — D649 Anemia, unspecified: Secondary | ICD-10-CM | POA: Diagnosis not present

## 2020-01-06 NOTE — Patient Instructions (Signed)

## 2020-01-07 ENCOUNTER — Encounter: Payer: Self-pay | Admitting: Dermatology

## 2020-01-07 ENCOUNTER — Ambulatory Visit (INDEPENDENT_AMBULATORY_CARE_PROVIDER_SITE_OTHER): Payer: Medicare HMO | Admitting: Dermatology

## 2020-01-07 DIAGNOSIS — I110 Hypertensive heart disease with heart failure: Secondary | ICD-10-CM | POA: Diagnosis not present

## 2020-01-07 DIAGNOSIS — Z1283 Encounter for screening for malignant neoplasm of skin: Secondary | ICD-10-CM

## 2020-01-07 DIAGNOSIS — L814 Other melanin hyperpigmentation: Secondary | ICD-10-CM

## 2020-01-07 DIAGNOSIS — I509 Heart failure, unspecified: Secondary | ICD-10-CM | POA: Diagnosis not present

## 2020-01-07 DIAGNOSIS — L578 Other skin changes due to chronic exposure to nonionizing radiation: Secondary | ICD-10-CM | POA: Diagnosis not present

## 2020-01-07 DIAGNOSIS — L821 Other seborrheic keratosis: Secondary | ICD-10-CM

## 2020-01-07 DIAGNOSIS — L219 Seborrheic dermatitis, unspecified: Secondary | ICD-10-CM | POA: Diagnosis not present

## 2020-01-07 DIAGNOSIS — E538 Deficiency of other specified B group vitamins: Secondary | ICD-10-CM | POA: Diagnosis not present

## 2020-01-07 DIAGNOSIS — Z86018 Personal history of other benign neoplasm: Secondary | ICD-10-CM

## 2020-01-07 DIAGNOSIS — R69 Illness, unspecified: Secondary | ICD-10-CM | POA: Diagnosis not present

## 2020-01-07 DIAGNOSIS — D485 Neoplasm of uncertain behavior of skin: Secondary | ICD-10-CM | POA: Diagnosis not present

## 2020-01-07 DIAGNOSIS — D229 Melanocytic nevi, unspecified: Secondary | ICD-10-CM | POA: Diagnosis not present

## 2020-01-07 DIAGNOSIS — E785 Hyperlipidemia, unspecified: Secondary | ICD-10-CM | POA: Diagnosis not present

## 2020-01-07 DIAGNOSIS — D692 Other nonthrombocytopenic purpura: Secondary | ICD-10-CM

## 2020-01-07 DIAGNOSIS — D1801 Hemangioma of skin and subcutaneous tissue: Secondary | ICD-10-CM | POA: Diagnosis not present

## 2020-01-07 DIAGNOSIS — R251 Tremor, unspecified: Secondary | ICD-10-CM | POA: Diagnosis not present

## 2020-01-07 DIAGNOSIS — L853 Xerosis cutis: Secondary | ICD-10-CM | POA: Diagnosis not present

## 2020-01-07 DIAGNOSIS — K219 Gastro-esophageal reflux disease without esophagitis: Secondary | ICD-10-CM | POA: Diagnosis not present

## 2020-01-07 DIAGNOSIS — D224 Melanocytic nevi of scalp and neck: Secondary | ICD-10-CM | POA: Diagnosis not present

## 2020-01-07 DIAGNOSIS — L820 Inflamed seborrheic keratosis: Secondary | ICD-10-CM

## 2020-01-07 DIAGNOSIS — R531 Weakness: Secondary | ICD-10-CM | POA: Diagnosis not present

## 2020-01-07 DIAGNOSIS — D649 Anemia, unspecified: Secondary | ICD-10-CM | POA: Diagnosis not present

## 2020-01-07 MED ORDER — HYDROCORTISONE 2.5 % EX CREA: TOPICAL_CREAM | CUTANEOUS | 5 refills | Status: DC

## 2020-01-07 MED ORDER — KETOCONAZOLE 2 % EX SHAM: MEDICATED_SHAMPOO | CUTANEOUS | 5 refills | Status: DC

## 2020-01-07 MED ORDER — KETOCONAZOLE 2 % EX CREA: TOPICAL_CREAM | CUTANEOUS | 5 refills | Status: DC

## 2020-01-07 NOTE — Patient Instructions (Addendum)
Dry Skin Care  What causes dry skin?  Dry skin is common and results from inadequate moisture in the outer skin layers. Dry skin usually results from the excessive loss of moisture from the skin surface. This occurs due to two major factors: 1. Normally the skin's oil glands deposit a layer of oil on the skin's surface. This layer of oil prevents the loss of moisture from the skin. Exposure to soaps, cleaners, solvents, and disinfectants removes this oily film, allowing water to escape. 2. Water loss from the skin increases when the humidity is low. During winter months we spend a lot of time indoors where the air is heated. Heated air has very low humidity. This also contributes to dry skin.  A tendency for dry skin may accompany such disorders as eczema. Also, as people age, the number of functioning oil glands decreases, and the tendency toward dry skin can be a sensation of skin tightness when emerging from the shower.  How do I manage dry skin?  1. Humidify your environment. This can be accomplished by using a humidifier in your bedroom at night during winter months. 2. Bathing can actually put moisture back into your skin if done right. Take the following steps while bathing to sooth dry skin:  Avoid hot water, which only dries the skin and makes itching worse. Use warm water.  Avoid washcloths or extensive rubbing or scrubbing.  Use mild soaps like unscented Dove, Oil of Olay, Cetaphil, Basis, or CeraVe.  If you take baths rather than showers, rinse off soap residue with clean water before getting out of tub.  Once out of the shower/tub, pat dry gently with a soft towel. Leave your skin damp.  While still damp, apply any medicated ointment/cream you were prescribed to the affected areas. After you apply your medicated ointment/cream, then apply your moisturizer to your whole body.This is the most important step in dry skin care. If this is omitted, your skin will continue to be  dry.  The choice of moisturizer is also very important. In general, lotion will not provider enough moisture to severely dry skin because it is water based. You should use an ointment or cream. Moisturizers should also be unscented. Good choices include Vaseline (plain petrolatum), Aquaphor, Cetaphil, CeraVe, Vanicream, DML Forte, Aveeno moisture, or Eucerin Cream.  Bath oils can be helpful, but do not replace the application of moisturizer after the bath. In addition, they make the tub slippery causing an increased risk for falls. Therefore, we do not recommend their use.    Seborrheic Dermatitis  Mix hydrocortisone with ketaconazole 2% twice a day. If improved, decrease to hydrocortisone and ketaconazole mixed once a day. If still clear, decrease to ketaconazole only.  Wound Care Instructions  1. Cleanse wound gently with soap and water once a day then pat dry with clean gauze. Apply a thing coat of Petrolatum (petroleum jelly, "Vaseline") over the wound (unless you have an allergy to this). We recommend that you use a new, sterile tube of Vaseline. Do not pick or remove scabs. Do not remove the yellow or white "healing tissue" from the base of the wound.  2. Cover the wound with fresh, clean, nonstick gauze and secure with paper tape. You may use Band-Aids in place of gauze and tape if the would is small enough, but would recommend trimming much of the tape off as there is often too much. Sometimes Band-Aids can irritate the skin.  3. You should call the office for your  biopsy report after 1 week if you have not already been contacted.  4. If you experience any problems, such as abnormal amounts of bleeding, swelling, significant bruising, significant pain, or evidence of infection, please call the office immediately.

## 2020-01-07 NOTE — Progress Notes (Signed)
Follow-Up Visit   Subjective  Selena Bush is a 74 y.o. female who presents for the following: Annual Exam.  She needs rfs for meds she uses for face and scalp, flaky rash.  She has several growths that get irritated on her legs and neck that she would like removed.   The following portions of the chart were reviewed this encounter and updated as appropriate:      Review of Systems:  No other skin or systemic complaints except as noted in HPI or Assessment and Plan.  Objective  Well appearing patient in no apparent distress; mood and affect are within normal limits.  A full examination was performed including scalp, head, eyes, ears, nose, lips, neck, chest, axillae, abdomen, back, buttocks, bilateral upper extremities, bilateral lower extremities, hands, feet, fingers, toes, fingernails, and toenails. All findings within normal limits unless otherwise noted below.  Objective  Left Medial Ankle: Scar with no evidence of recurrence.   Objective  Left Upper Thigh x 3, R upper calf x 1, Right upper thigh x 3 (7): Erythematous keratotic or waxy stuck-on papule or plaque.   Objective  Right Posterior Neck: 6.80mm pink flesh papule  Objective  Right Upper Thigh: 5.54mm waxy pedunculated papule  Objective  Face, Scalp: Pink patches with greasy scale.   Objective  Legs: Xerosis   Assessment & Plan   Skin cancer screening performed today.  Actinic Damage - diffuse scaly erythematous macules with underlying dyspigmentation - Recommend daily broad spectrum sunscreen SPF 30+ to sun-exposed areas, reapply every 2 hours as needed.  - Call for new or changing lesions.  Seborrheic Keratoses - Stuck-on, waxy, tan-brown papules and plaques  - Discussed benign etiology and prognosis. - Observe - Call for any changes   Lentigines - Scattered tan macules - Discussed due to sun exposure - Benign, observe - Call for any changes  Hemangiomas - Red papules - Discussed  benign nature - Observe - Call for any changes Melanocytic Nevi - Tan-brown and/or pink-flesh-colored symmetric macules and papules - Benign appearing on exam today - Observation - Call clinic for new or changing moles - Recommend daily use of broad spectrum spf 30+ sunscreen to sun-exposed areas.   Purpura - Violaceous macules and patches - Benign - Related to age, sun damage and/or use of blood thinners - Observe - Can use OTC arnica containing moisturizer such as Dermend Bruise Formula if desired - Call for worsening or other concerns    History of dysplastic nevus Left Medial Ankle  Clear. Observe for recurrence. Call clinic for new or changing lesions.  Recommend regular skin exams, daily broad-spectrum spf 30+ sunscreen use, and photoprotection.     Inflamed seborrheic keratosis (7) Left Upper Thigh x 3, R upper calf x 1, Right upper thigh x 3  Destruction of lesion - Left Upper Thigh x 3, R upper calf x 1, Right upper thigh x 3  Destruction method: cryotherapy   Informed consent: discussed and consent obtained   Lesion destroyed using liquid nitrogen: Yes   Region frozen until ice ball extended beyond lesion: Yes   Outcome: patient tolerated procedure well with no complications   Post-procedure details: wound care instructions given    Neoplasm of uncertain behavior of skin (2) Right Posterior Neck  Skin / nail biopsy Type of biopsy: tangential   Informed consent: discussed and consent obtained   Patient was prepped and draped in usual sterile fashion: Area prepped with alcohol. Anesthesia: the lesion was anesthetized in  a standard fashion   Anesthetic:  1% lidocaine w/ epinephrine 1-100,000 buffered w/ 8.4% NaHCO3 Instrument used: flexible razor blade   Hemostasis achieved with: pressure, aluminum chloride and electrodesiccation   Outcome: patient tolerated procedure well   Post-procedure details: wound care instructions given   Post-procedure details  comment:  Ointment and small bandage applied  Specimen 1 - Surgical pathology Differential Diagnosis: Irritated nevus vs other Check Margins: No 6.9mm pink flesh papule  Right Upper Thigh  Skin / nail biopsy Type of biopsy: tangential   Informed consent: discussed and consent obtained   Patient was prepped and draped in usual sterile fashion: Area prepped with alcohol. Anesthesia: the lesion was anesthetized in a standard fashion   Anesthetic:  1% lidocaine w/ epinephrine 1-100,000 buffered w/ 8.4% NaHCO3 Instrument used: flexible razor blade   Hemostasis achieved with: pressure, aluminum chloride and electrodesiccation   Outcome: patient tolerated procedure well   Post-procedure details: wound care instructions given   Post-procedure details comment:  Ointment and small bandage applied  Specimen 2 - Surgical pathology Differential Diagnosis: Inflamed SK vs Irritated Acrochordon Check Margins: No 5.18mm waxy pedunculated papule  Seborrheic dermatitis Face, Scalp  Cont Ketoconazole 2% shampoo Apply to scalp and let sit several mins before rinsing. Cont Ketoconazole 2% Cream QD/BID affected areas face as directed. Start 2.5% Hydrocortisone Cream QD/BID affected areas as directed PRN.  ketoconazole (NIZORAL) 2 % shampoo - Face, Scalp  hydrocortisone 2.5 % cream - Face, Scalp  ketoconazole (NIZORAL) 2 % cream - Face, Scalp  Xerosis cutis Legs  Recommend mild soap and moisturizing cream 1-2 times daily.  Continue Cetaphil Cream daily.  Return in about 1 year (around 01/06/2021) for TBSE.   IJamesetta Orleans, CMA, am acting as scribe for Brendolyn Patty, MD .  Documentation: I have reviewed the above documentation for accuracy and completeness, and I agree with the above.  Brendolyn Patty MD

## 2020-01-09 ENCOUNTER — Telehealth: Payer: Self-pay

## 2020-01-09 NOTE — Telephone Encounter (Signed)
-----   Message from Brendolyn Patty, MD sent at 01/08/2020  5:13 PM EDT ----- 1. Skin , right posterior neck MELANOCYTIC NEVUS, INTRADERMAL TYPE 2. Skin , right upper thigh SEBORRHEIC KERATOSIS, IRRITATED  Both benign, no further treatment needed

## 2020-01-09 NOTE — Telephone Encounter (Signed)
Lft pt msg to call for bx results/sh °

## 2020-01-10 ENCOUNTER — Ambulatory Visit: Payer: Self-pay

## 2020-01-10 DIAGNOSIS — I5032 Chronic diastolic (congestive) heart failure: Secondary | ICD-10-CM

## 2020-01-10 DIAGNOSIS — I10 Essential (primary) hypertension: Secondary | ICD-10-CM

## 2020-01-10 NOTE — Progress Notes (Signed)
Monthly medication coordination call:  Patient denies medication changes or health concerns.  Reviewed chart for medication changes, none identified   Recent visits:  01/07/20: Dermatology - no med changes  01/06/20: Cardiology - AFIB, normal sinus rhythm, thought to be secondary to pericarditis, bleeding on Xarelto thus no anticoagulation, discussed discontinuing sotalol but pt would like to continue for now, cont Cardizem and BB, BO controlled, CHF euvolemic, cont diuretic dose  12/09/19: Neurology - tremor, schedule B12 injections every 3 months, order in home PT for left arm weakness, order nerve test to [facility] out nerve damage left arm, cont current meds, rtc 3-4 months  Office blood pressures are: BP Readings from Last 3 Encounters:  01/06/20 (!) 150/70  10/30/19 124/76  07/29/19 122/80   BP elevated at cardiology appt, pt to monitor home BP and call if elevated.  Patient obtains medications through [facility]  30 Days   Patient is due for next adherence delivery on: 02/14/2020  Called patient and reviewed medications and coordinated delivery.  This delivery to include:  Sotalol 120 mg - 1 BF, 1 evening meal  Diltiazem 180 mg - 1 BF  Metoprolol tartrate 25 mg - 3 BF, 3 Evening meal   Furosemide 40 mg -  BF  Potassium SA 20 mEq - 1 BF  Duloxetine 60 mg - 1 BF, 1 evening meal  Gabapentin 100 mg - 1 BF, 1 evening meal, 1 bedtime  Tizanidine 4 mg - 2 tablets at bedtime  Confirmed delivery date of 01/14/20, pharmacy will contact patient the morning of delivery.   [name], PharmD Clinical Pharmacist [facility] [phone]

## 2020-01-14 ENCOUNTER — Telehealth: Payer: Self-pay

## 2020-01-14 ENCOUNTER — Ambulatory Visit: Payer: Medicare HMO | Admitting: Cardiology

## 2020-01-14 DIAGNOSIS — E785 Hyperlipidemia, unspecified: Secondary | ICD-10-CM | POA: Diagnosis not present

## 2020-01-14 DIAGNOSIS — K219 Gastro-esophageal reflux disease without esophagitis: Secondary | ICD-10-CM | POA: Diagnosis not present

## 2020-01-14 DIAGNOSIS — E538 Deficiency of other specified B group vitamins: Secondary | ICD-10-CM | POA: Diagnosis not present

## 2020-01-14 DIAGNOSIS — R69 Illness, unspecified: Secondary | ICD-10-CM | POA: Diagnosis not present

## 2020-01-14 DIAGNOSIS — R251 Tremor, unspecified: Secondary | ICD-10-CM | POA: Diagnosis not present

## 2020-01-14 DIAGNOSIS — I509 Heart failure, unspecified: Secondary | ICD-10-CM | POA: Diagnosis not present

## 2020-01-14 DIAGNOSIS — I110 Hypertensive heart disease with heart failure: Secondary | ICD-10-CM | POA: Diagnosis not present

## 2020-01-14 DIAGNOSIS — D649 Anemia, unspecified: Secondary | ICD-10-CM | POA: Diagnosis not present

## 2020-01-14 DIAGNOSIS — R531 Weakness: Secondary | ICD-10-CM | POA: Diagnosis not present

## 2020-01-14 NOTE — Telephone Encounter (Signed)
-----   Message from Brendolyn Patty, MD sent at 01/08/2020  5:13 PM EDT ----- 1. Skin , right posterior neck MELANOCYTIC NEVUS, INTRADERMAL TYPE 2. Skin , right upper thigh SEBORRHEIC KERATOSIS, IRRITATED  Both benign, no further treatment needed

## 2020-01-14 NOTE — Telephone Encounter (Signed)
Advised pt of bx results/sh ?

## 2020-01-15 DIAGNOSIS — M25642 Stiffness of left hand, not elsewhere classified: Secondary | ICD-10-CM | POA: Diagnosis not present

## 2020-01-15 DIAGNOSIS — R498 Other voice and resonance disorders: Secondary | ICD-10-CM | POA: Diagnosis not present

## 2020-01-15 DIAGNOSIS — R251 Tremor, unspecified: Secondary | ICD-10-CM | POA: Diagnosis not present

## 2020-01-15 DIAGNOSIS — R200 Anesthesia of skin: Secondary | ICD-10-CM | POA: Diagnosis not present

## 2020-01-15 DIAGNOSIS — R29898 Other symptoms and signs involving the musculoskeletal system: Secondary | ICD-10-CM | POA: Diagnosis not present

## 2020-01-18 ENCOUNTER — Emergency Department
Admission: EM | Admit: 2020-01-18 | Discharge: 2020-01-18 | Disposition: A | Discharge status: Home / Self Care | Payer: Medicare HMO | Attending: Emergency Medicine | Admitting: Emergency Medicine

## 2020-01-18 ENCOUNTER — Other Ambulatory Visit: Payer: Self-pay

## 2020-01-18 DIAGNOSIS — Z5321 Procedure and treatment not carried out due to patient leaving prior to being seen by health care provider: Secondary | ICD-10-CM | POA: Insufficient documentation

## 2020-01-18 DIAGNOSIS — H5711 Ocular pain, right eye: Secondary | ICD-10-CM | POA: Insufficient documentation

## 2020-01-18 NOTE — ED Triage Notes (Signed)
Patient states thinks she got shampoo in her right eye and now causing pain.

## 2020-01-21 DIAGNOSIS — R69 Illness, unspecified: Secondary | ICD-10-CM | POA: Diagnosis not present

## 2020-01-21 DIAGNOSIS — I509 Heart failure, unspecified: Secondary | ICD-10-CM | POA: Diagnosis not present

## 2020-01-21 DIAGNOSIS — E538 Deficiency of other specified B group vitamins: Secondary | ICD-10-CM | POA: Diagnosis not present

## 2020-01-21 DIAGNOSIS — K219 Gastro-esophageal reflux disease without esophagitis: Secondary | ICD-10-CM | POA: Diagnosis not present

## 2020-01-21 DIAGNOSIS — E785 Hyperlipidemia, unspecified: Secondary | ICD-10-CM | POA: Diagnosis not present

## 2020-01-21 DIAGNOSIS — R251 Tremor, unspecified: Secondary | ICD-10-CM | POA: Diagnosis not present

## 2020-01-21 DIAGNOSIS — I110 Hypertensive heart disease with heart failure: Secondary | ICD-10-CM | POA: Diagnosis not present

## 2020-01-21 DIAGNOSIS — D649 Anemia, unspecified: Secondary | ICD-10-CM | POA: Diagnosis not present

## 2020-01-21 DIAGNOSIS — R531 Weakness: Secondary | ICD-10-CM | POA: Diagnosis not present

## 2020-01-23 DIAGNOSIS — R69 Illness, unspecified: Secondary | ICD-10-CM | POA: Diagnosis not present

## 2020-01-23 DIAGNOSIS — F4312 Post-traumatic stress disorder, chronic: Secondary | ICD-10-CM | POA: Diagnosis not present

## 2020-02-02 ENCOUNTER — Emergency Department: Payer: Medicare HMO

## 2020-02-02 ENCOUNTER — Emergency Department
Admission: EM | Admit: 2020-02-02 | Discharge: 2020-02-02 | Disposition: A | Discharge status: Home / Self Care | Payer: Medicare HMO | Attending: Emergency Medicine | Admitting: Emergency Medicine

## 2020-02-02 ENCOUNTER — Other Ambulatory Visit: Payer: Self-pay

## 2020-02-02 DIAGNOSIS — W01198A Fall on same level from slipping, tripping and stumbling with subsequent striking against other object, initial encounter: Secondary | ICD-10-CM | POA: Insufficient documentation

## 2020-02-02 DIAGNOSIS — R079 Chest pain, unspecified: Secondary | ICD-10-CM | POA: Insufficient documentation

## 2020-02-02 DIAGNOSIS — M542 Cervicalgia: Secondary | ICD-10-CM | POA: Diagnosis not present

## 2020-02-02 DIAGNOSIS — I110 Hypertensive heart disease with heart failure: Secondary | ICD-10-CM | POA: Diagnosis not present

## 2020-02-02 DIAGNOSIS — Z96651 Presence of right artificial knee joint: Secondary | ICD-10-CM | POA: Diagnosis not present

## 2020-02-02 DIAGNOSIS — S0083XA Contusion of other part of head, initial encounter: Secondary | ICD-10-CM | POA: Insufficient documentation

## 2020-02-02 DIAGNOSIS — Y929 Unspecified place or not applicable: Secondary | ICD-10-CM | POA: Diagnosis not present

## 2020-02-02 DIAGNOSIS — I5032 Chronic diastolic (congestive) heart failure: Secondary | ICD-10-CM | POA: Diagnosis not present

## 2020-02-02 DIAGNOSIS — Z853 Personal history of malignant neoplasm of breast: Secondary | ICD-10-CM | POA: Insufficient documentation

## 2020-02-02 DIAGNOSIS — S0990XA Unspecified injury of head, initial encounter: Secondary | ICD-10-CM

## 2020-02-02 DIAGNOSIS — Y998 Other external cause status: Secondary | ICD-10-CM | POA: Insufficient documentation

## 2020-02-02 DIAGNOSIS — Y9301 Activity, walking, marching and hiking: Secondary | ICD-10-CM | POA: Insufficient documentation

## 2020-02-02 DIAGNOSIS — S61412A Laceration without foreign body of left hand, initial encounter: Secondary | ICD-10-CM | POA: Insufficient documentation

## 2020-02-02 DIAGNOSIS — Z96652 Presence of left artificial knee joint: Secondary | ICD-10-CM | POA: Diagnosis not present

## 2020-02-02 DIAGNOSIS — W19XXXA Unspecified fall, initial encounter: Secondary | ICD-10-CM

## 2020-02-02 DIAGNOSIS — Z79899 Other long term (current) drug therapy: Secondary | ICD-10-CM | POA: Diagnosis not present

## 2020-02-02 DIAGNOSIS — R58 Hemorrhage, not elsewhere classified: Secondary | ICD-10-CM | POA: Diagnosis not present

## 2020-02-02 DIAGNOSIS — S0081XA Abrasion of other part of head, initial encounter: Secondary | ICD-10-CM

## 2020-02-02 DIAGNOSIS — S199XXA Unspecified injury of neck, initial encounter: Secondary | ICD-10-CM | POA: Diagnosis not present

## 2020-02-02 NOTE — Discharge Instructions (Addendum)
You may shower and get Dermabond wet.  Allow Dermabond to come off on its own.  Return to the ER or primary care physician's office for any severe headaches, nausea/vomiting, vision changes, worsening symptoms or urgent changes in your health.  Apply antibiotic ointment daily to facial abrasions.

## 2020-02-02 NOTE — ED Notes (Signed)
Patient here for trip and fall, laceration repaired by PA.  Alert and oriented.  No other complaints

## 2020-02-02 NOTE — ED Triage Notes (Signed)
Pt states she fell from standing position tonight striking head on hardwood floor. Pt denies loc and denies neck pain. Pt with abrasion noted to left periorbtial area and hand laceration, left. Pt appears in no acute distress.

## 2020-02-02 NOTE — ED Notes (Signed)
Patient to ED waiting room via wheelchair by [facility] for a fall.  Picking up food delivery tripped on step fell hit head.  No loss of consciousness, does not take blood thinners.  VS - 150/98, hr 74, pulse oxi 99% ra, cbg 109

## 2020-02-02 NOTE — ED Notes (Signed)
Pt to CT

## 2020-02-02 NOTE — ED Provider Notes (Signed)
[place] EMERGENCY DEPARTMENT Provider Note   CSN: [id number] Arrival date & time: 02/02/20  1934     History Chief Complaint  Patient presents with  . Fall    Selena Bush is a 74 y.o. female presents to the emergency department for evaluation of fall.  Just prior to arrival patient tripped on a step, fell forward hitting her head on the hardwood floors.  She denies any loss of consciousness, nausea, vomiting, headache, neck pain, vision changes.  No numbness tingling radicular symptoms.  She has facial swelling to the left forehead and periorbital region with mild facial abrasions.  She also has a skin tear to the dorsal aspect of the left hand.  She has been ambulatory and denies any lower back pain, abdominal pain, chest pain, shortness of breath or lower extremity discomfort.  Tetanus is up-to-date.  HPI     Past Medical History:  Diagnosis Date  . Anxiety   . Atrial fibrillation ([place])   . Chronic diastolic CHF (congestive heart failure) ([place])    a. echo 09/2014: EF 40-81%, diastolic dysfunction, mild LVH, nl RV size & systolic function, mildly dilated LA (4.3 cm), mild MR/TR, mildly elevated PASP 36.7 mm Hg  . DDD (degenerative disc disease), cervical   . DDD (degenerative disc disease), lumbar   . Depression   . Diffuse cystic mastopathy 2014  . Dyspnea   . GERD (gastroesophageal reflux disease)   . Gout   . Headache    rare  . HLD (hyperlipidemia)    a. statin intolerant 2/2 myalgias  . Hx of dysplastic nevus 12/25/2018   L medial ankle  . Hypercholesterolemia   . Hypertension   . Malignant neoplasm of upper-outer quadrant of female breast ([place]) 10/2012   Papillary DCIS, sentinel node negative. DR/PR positive. PARTIAL RIGHT MASTECTOMY FOR BREAST CANCER--HAD RADIATION - NO CHEMO --DR. [place] ONCOLOGIST  . Obesity   . OSA on CPAP   . Osteoarthritis of both knees    a. s/p right TKA 04/2013 & left TKA 09/2014  . Otitis externa     . Personal history of radiation therapy 2015   RIGHT breast-mammosite per pt  . Sleep difficulties    LUNESTA HAS HELPED  . Vaginal cyst     Patient Active Problem List   Diagnosis Date Noted  . Tremor of left hand 07/29/2019  . Cat bite of right hand 03/25/2019  . DDD (degenerative disc disease), lumbar 10/19/2018  . Preventative health care 10/19/2018  . Prediabetes 10/19/2018  . (HFpEF) heart failure with preserved ejection fraction ([place]) 12/12/2017  . Pericardial effusion with cardiac tamponade 11/13/2017  . Paroxysmal atrial fibrillation ([place]) 11/05/2017  . Anxiety 03/21/2016  . Insomnia 07/17/2015  . MI (mitral incompetence) 12/10/2014  . Major depressive disorder 12/09/2014  . OSA (obstructive sleep apnea) 12/09/2014  . GERD (gastroesophageal reflux disease) 12/09/2014  . Hyperglycemia 12/09/2014  . Chronic diastolic CHF (congestive heart failure) ([place])   . HLD (hyperlipidemia)   . Osteoarthritis of both knees   . S/P left TKA 08/19/2014  . 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 (BMI) 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 ([place]) 04/17/2013  . DCIS (ductal carcinoma in situ) 11/05/2012  . Essential hypertension, benign 11/05/2012    Past Surgical History:  Procedure Laterality Date  . ABDOMINAL HYSTERECTOMY  1992   DUB; fibroids; endometriosis.  One remaining ovary.    [name] BREAST LUMPECTOMY Right 2015   Papillary DCIS, sentinel node negative. DR/PR positive.  PARTIAL RIGHT MASTECTOMY FOR BREAST CANCER--HAD RADIATION - NO CHEMO --DR. [place] ONCOLOGIST  . BREAST SURGERY Right March 2014   Wide excision,APB RT 10 mm papillary DCIS, ER/PR positive. Sentinel node negative. Partial breast radiation.  [name] CATARACT EXTRACTION, BILATERAL  02/13/2016   [place].  . CHOLECYSTECTOMY  1994  . COLONOSCOPY  2015   1 benign polyp-every 5 years/ Dr Candace Cruise  . ERCP  1995  . JOINT REPLACEMENT Right Sept 2014   knee  . PERICARDIOCENTESIS N/A 11/13/2017   Procedure:  PERICARDIOCENTESIS;  Surgeon: Nelva Bush, MD;  Location: [facility];  Service: Cardiovascular;  Laterality: N/A;  . TOTAL KNEE ARTHROPLASTY Right 04/16/2013   Procedure: RIGHT TOTAL KNEE ARTHROPLASTY;  Surgeon: Mauri Pole, MD;  Location: [facility];  Service: Orthopedics;  Laterality: Right;  . TOTAL KNEE ARTHROPLASTY Left 09/15/2014   Procedure: LEFT TOTAL KNEE ARTHROPLASTY;  Surgeon: Mauri Pole, MD;  Location: [facility];  Service: Orthopedics;  Laterality: Left;  . TUBAL LIGATION  1979     OB History    Gravida  2   Para  2   Term      Preterm      AB      Living  2     SAB      TAB      Ectopic      Multiple      Live Births           Obstetric Comments  First pregnancy 22 First menstrual 15        Family History  Problem Relation Age of Onset  . Colon cancer Mother   . Cancer Mother 75       colon cancer  . Melanoma Father   . Cancer Father 11       melanoma  . Colon cancer Maternal Grandfather   . Breast cancer Maternal Grandfather   . Melanoma Sister   . Melanoma Sister     Social History   Tobacco Use  . Smoking status: Never Smoker  . Smokeless tobacco: Never Used  . Tobacco comment: social smoker as a teen  Media planner  . Vaping Use: Never used  Substance Use Topics  . Alcohol use: No  . Drug use: No    Home Medications Prior to Admission medications   Medication Sig Start Date End Date Taking? Authorizing Provider  diltiazem (CARDIZEM CD) 180 MG 24 hr capsule Take 1 capsule (180 mg total) by mouth daily. 10/08/19   Lelon Perla, MD  DULoxetine (CYMBALTA) 60 MG capsule TAKE 1 CAPSULE BY MOUTH 2 TIMES A DAY for anxiety and depression. 11/13/19   Pleas Koch, NP  fluticasone (FLONASE) 50 MCG/ACT nasal spray Place 2 sprays into both nostrils daily as needed for rhinitis or allergies. 02/05/19   Pleas Koch, NP  furosemide (LASIX) 40 MG tablet Take 0.5 tablets (20 mg total) by mouth daily. 10/08/19   Lelon Perla, MD  gabapentin (NEURONTIN) 100 MG capsule TAKE 1 CAPSULE BY MOUTH THREE TIMES A DAY for anxiety. 11/13/19   Pleas Koch, NP  hydrocortisone 2.5 % cream Apply to affected areas face 1-2 times a day as directed and as needed. 01/07/20   Brendolyn Patty, MD  ketoconazole (NIZORAL) 2 % cream Apply to affected areas face 1-2 times a day as directed. 01/07/20   Brendolyn Patty, MD  ketoconazole (NIZORAL) 2 % shampoo Apply to scalp QOD, let sit several mins before  rinsing. 01/07/20   Brendolyn Patty, MD  metoprolol tartrate (LOPRESSOR) 25 MG tablet TAKE 3 TABLETS BY MOUTH 2 TIMES DAILY. 10/08/19   Lelon Perla, MD  potassium chloride SA (KLOR-CON M20) 20 MEQ tablet Take 1 tablet (20 mEq total) by mouth daily. 10/08/19   Lelon Perla, MD  sotalol (BETAPACE) 120 MG tablet Take 1 tablet (120 mg total) by mouth every 12 (twelve) hours. 10/08/19   Lelon Perla, MD  tiZANidine (ZANAFLEX) 4 MG tablet TAKE 2 TABLET (4 MG TOTAL) BY MOUTH DAILY FOR MUSCLE SPASMS AT BEDTIME 10/07/19   Pleas Koch, NP    Allergies    Penicillins, Sulfa antibiotics, Codeine, Statins, and Aspirin  Review of Systems   Review of Systems  Constitutional: Negative for fever.  Respiratory: Negative for cough, chest tightness and shortness of breath.   Cardiovascular: Positive for chest pain.  Gastrointestinal: Negative for abdominal pain, nausea and vomiting.  Musculoskeletal: Negative for arthralgias, back pain and neck pain.  Skin: Positive for wound.  Neurological: Negative for dizziness, weakness and headaches.    Physical Exam Updated Vital Signs BP (!) 161/90   Pulse (!) 52   Temp 98 F (36.7 C) (Oral)   Resp 16   Ht 5\' 2"  (1.575 m)   Wt 90.7 kg   SpO2 100%   BMI 36.58 kg/m   Physical Exam Constitutional:      Appearance: She is well-developed.  HENT:     Head: Normocephalic.     Comments: Small hematoma to the left forehead, small abrasions to the left periorbital region.  No vision  changes.  No sign of foreign body.  EOM intact    Right Ear: Tympanic membrane, ear canal and external ear normal.     Left Ear: Tympanic membrane, ear canal and external ear normal.     Nose: Nose normal.  Eyes:     Extraocular Movements: Extraocular movements intact.     Conjunctiva/sclera: Conjunctivae normal.     Pupils: Pupils are equal, round, and reactive to light.  Cardiovascular:     Rate and Rhythm: Normal rate.  Pulmonary:     Effort: Pulmonary effort is normal. No respiratory distress.     Breath sounds: Normal breath sounds.  Abdominal:     Palpations: Abdomen is soft.     Tenderness: There is no abdominal tenderness.  Musculoskeletal:        General: No deformity. Normal range of motion.     Cervical back: Normal range of motion.     Comments: Left hand with normal range of motion.  No tenderness palpation.  Small 1 cm diameter skin tear along the dorsal aspect left hand at the fifth metacarpal with slight bruising noted.  No deformity or rotation of the digit.  No extensor or flexor tendon deficits.  Nontender along the shoulders, clavicles, chest cervical thoracic or lumbar spine.  No lower extremity discomfort with logrolling  Skin:    General: Skin is warm and dry.     Findings: No rash.  Neurological:     Mental Status: She is alert and oriented to person, place, and time.     Cranial Nerves: No cranial nerve deficit.     Coordination: Coordination normal.  Psychiatric:        Behavior: Behavior normal.     ED Results / Procedures / Treatments   Labs (all labs ordered are listed, but only abnormal results are displayed) Labs Reviewed - No data to display  EKG None  Radiology CT Head Wo Contrast  Result Date: 02/02/2020 CLINICAL DATA:  Fall from standing with head and neck pain. EXAM: CT HEAD WITHOUT CONTRAST CT CERVICAL SPINE WITHOUT CONTRAST TECHNIQUE: Multidetector CT imaging of the head and cervical spine was performed following the standard protocol  without intravenous contrast. Multiplanar CT image reconstructions of the cervical spine were also generated. COMPARISON:  CT head dated 09/01/2018 FINDINGS: CT HEAD FINDINGS Brain: No evidence of acute infarction, hemorrhage, hydrocephalus, extra-axial collection or mass lesion/mass effect. Periventricular white matter hypoattenuation likely represents chronic small vessel ischemic disease. Vascular: There are vascular calcifications in the carotid siphons. Skull: Normal. Negative for fracture or focal lesion. Sinuses/Orbits: There is moderate right sphenoid sinus disease. Other: There is soft tissue swelling overlying the left periorbital region. CT CERVICAL SPINE FINDINGS Alignment: Normal. Skull base and vertebrae: No acute fracture. No primary bone lesion or focal pathologic process. Soft tissues and spinal canal: No prevertebral fluid or swelling. No visible canal hematoma. Disc levels:  Up to multilevel degenerative disc and joint disease. Upper chest: Negative. Other: None IMPRESSION: 1. No acute intracranial process. 2. No acute osseous injury in the cervical spine. Electronically Signed   By: Zerita Boers M.D.   On: 02/02/2020 20:26   CT Cervical Spine Wo Contrast  Result Date: 02/02/2020 CLINICAL DATA:  Fall from standing with head and neck pain. EXAM: CT HEAD WITHOUT CONTRAST CT CERVICAL SPINE WITHOUT CONTRAST TECHNIQUE: Multidetector CT imaging of the head and cervical spine was performed following the standard protocol without intravenous contrast. Multiplanar CT image reconstructions of the cervical spine were also generated. COMPARISON:  CT head dated 09/01/2018 FINDINGS: CT HEAD FINDINGS Brain: No evidence of acute infarction, hemorrhage, hydrocephalus, extra-axial collection or mass lesion/mass effect. Periventricular white matter hypoattenuation likely represents chronic small vessel ischemic disease. Vascular: There are vascular calcifications in the carotid siphons. Skull: Normal. Negative  for fracture or focal lesion. Sinuses/Orbits: There is moderate right sphenoid sinus disease. Other: There is soft tissue swelling overlying the left periorbital region. CT CERVICAL SPINE FINDINGS Alignment: Normal. Skull base and vertebrae: No acute fracture. No primary bone lesion or focal pathologic process. Soft tissues and spinal canal: No prevertebral fluid or swelling. No visible canal hematoma. Disc levels:  Up to multilevel degenerative disc and joint disease. Upper chest: Negative. Other: None IMPRESSION: 1. No acute intracranial process. 2. No acute osseous injury in the cervical spine. Electronically Signed   By: Zerita Boers M.D.   On: 02/02/2020 20:26    Procedures .Marland KitchenLaceration Repair  Date/Time: 02/02/2020 8:47 PM Performed by: Duanne Guess, PA-C Authorized by: Duanne Guess, PA-C   Consent:    Consent obtained:  Verbal   Consent given by:  Patient   Risks discussed:  Infection Treatment:    Area cleansed with:  Betadine and saline   Amount of cleaning:  Standard Skin repair:    Repair method:  Tissue adhesive Approximation:    Approximation:  Close Post-procedure details:    Dressing:  Adhesive bandage   Patient tolerance of procedure:  Tolerated well, no immediate complications   (including critical care time)  Medications Ordered in ED Medications - No data to display  ED Course  I have reviewed the triage vital signs and the nursing notes.  Pertinent labs & imaging results that were available during my care of the patient were reviewed by me and considered in my medical decision making (see chart for details).    MDM Rules/Calculators/A&P  74 year old female with fall earlier today.  [address] and hit her head suffering a small hematoma small abrasions.  No headache LOC nausea vomiting.  CT of the head and neck negative.  She did suffer a small skin tear to the left hand.  No signs of fracture on exam.  Tetanus up-to-date.  Skin tear  thoroughly cleansed and repaired with Dermabond.  She understands signs symptoms return to ED for. Final Clinical Impression(s) / ED Diagnoses Final diagnoses:  Fall, initial encounter  Injury of head, initial encounter  Abrasion of forehead, initial encounter  Traumatic hematoma of forehead, initial encounter  Skin tear of left hand without complication, initial encounter    Rx / DC Orders ED Discharge Orders    None       Renata Caprice 02/02/20 2051    Duffy Bruce, MD 02/03/20 [id number]

## 2020-02-10 ENCOUNTER — Other Ambulatory Visit: Payer: Self-pay | Admitting: Cardiology

## 2020-02-10 ENCOUNTER — Ambulatory Visit: Payer: Self-pay

## 2020-02-10 DIAGNOSIS — E7800 Pure hypercholesterolemia, unspecified: Secondary | ICD-10-CM

## 2020-02-10 DIAGNOSIS — I10 Essential (primary) hypertension: Secondary | ICD-10-CM

## 2020-02-10 NOTE — Chronic Care Management (AMB) (Signed)
Monthly medication coordination call:  Patient concerns: Reports Sinemet has helped a lot so far but still some tremor on left side. Reports she has f/u visit this week with neuro to discuss dose adjustments.  Patient reports the following med changes/ED visits since last monthly med sync call on 01/10/20:  02/02/20 ED: fell walking into the house, was carrying door dash meal, put one foot on front step and with second step up lost balance, fell and hit head on the floor, black eye; MRI was clear, denies balance issues since then  01/18/20 ED: bad reaction to shampoo - had eye pain, left ED prior to being seen; reports issue resolved on its own, avoiding shampoo   01/15/20: Neurology - started Sinemet 25/100 1 tablet TID (pt picked up from [facility]) for Parkinson's Disease  Office blood pressures are: BP Readings from Last 3 Encounters:  02/02/20 (!) 161/90  01/18/20 (!) 189/126  01/06/20 (!) 150/70   BP elevated 6/5 and 6/20 - both at ED and patient was stressed; BP 01/06/20 elevated at cardiology appt, pt instructed to monitor home BP and call if elevated.  Patient obtains medications throughAdherence Packaging; 30 day supply  Patient is due for next adherence delivery on:02/12/2020 Called patient and reviewed medications and coordinated delivery.  This delivery to include:  Sotalol 120 mg -1 BF, 1 evening meal  Diltiazem 180 mg -1 BF  Metoprolol tartrate 25 mg -3 BF, 3 Evening meal   Furosemide 40 mg - BF  Potassium SA 20 mEq -1 BF  Duloxetine 60 mg -1 BF, 1 evening meal  Gabapentin 100 mg -1 BF, 1 evening meal, 1 bedtime  Tizanidine 4 mg -2tablets atbedtime  Pt will continue to get [facility] from [facility] until dose is stabilized.  Requested refill: diltiazem [name]) Confirmed delivery date of6/30/21, pharmacy will contact patientthe morning of delivery.  [name], PharmD Clinical Pharmacist [facility] at [facility] [phone]

## 2020-02-12 DIAGNOSIS — R498 Other voice and resonance disorders: Secondary | ICD-10-CM | POA: Diagnosis not present

## 2020-02-12 DIAGNOSIS — G20 Parkinson's disease: Secondary | ICD-10-CM | POA: Diagnosis not present

## 2020-02-12 DIAGNOSIS — R29898 Other symptoms and signs involving the musculoskeletal system: Secondary | ICD-10-CM | POA: Diagnosis not present

## 2020-02-12 DIAGNOSIS — R251 Tremor, unspecified: Secondary | ICD-10-CM | POA: Diagnosis not present

## 2020-02-12 NOTE — Progress Notes (Signed)
I have collaborated with the care management provider regarding care management and care coordination activities outlined in this encounter and have reviewed this encounter including documentation in the note and care plan. I am certifying that I agree with the content of this note and encounter as supervising physician.  

## 2020-02-13 DIAGNOSIS — F4312 Post-traumatic stress disorder, chronic: Secondary | ICD-10-CM | POA: Diagnosis not present

## 2020-02-13 DIAGNOSIS — R69 Illness, unspecified: Secondary | ICD-10-CM | POA: Diagnosis not present

## 2020-02-16 DIAGNOSIS — G20 Parkinson's disease: Secondary | ICD-10-CM | POA: Diagnosis not present

## 2020-02-16 DIAGNOSIS — E538 Deficiency of other specified B group vitamins: Secondary | ICD-10-CM | POA: Diagnosis not present

## 2020-02-16 DIAGNOSIS — M1990 Unspecified osteoarthritis, unspecified site: Secondary | ICD-10-CM | POA: Diagnosis not present

## 2020-02-16 DIAGNOSIS — E785 Hyperlipidemia, unspecified: Secondary | ICD-10-CM | POA: Diagnosis not present

## 2020-02-16 DIAGNOSIS — I509 Heart failure, unspecified: Secondary | ICD-10-CM | POA: Diagnosis not present

## 2020-02-16 DIAGNOSIS — D649 Anemia, unspecified: Secondary | ICD-10-CM | POA: Diagnosis not present

## 2020-02-16 DIAGNOSIS — I110 Hypertensive heart disease with heart failure: Secondary | ICD-10-CM | POA: Diagnosis not present

## 2020-02-16 DIAGNOSIS — G5602 Carpal tunnel syndrome, left upper limb: Secondary | ICD-10-CM | POA: Diagnosis not present

## 2020-02-16 DIAGNOSIS — R69 Illness, unspecified: Secondary | ICD-10-CM | POA: Diagnosis not present

## 2020-02-21 DIAGNOSIS — I110 Hypertensive heart disease with heart failure: Secondary | ICD-10-CM | POA: Diagnosis not present

## 2020-02-21 DIAGNOSIS — R69 Illness, unspecified: Secondary | ICD-10-CM | POA: Diagnosis not present

## 2020-02-21 DIAGNOSIS — G5602 Carpal tunnel syndrome, left upper limb: Secondary | ICD-10-CM | POA: Diagnosis not present

## 2020-02-21 DIAGNOSIS — M1990 Unspecified osteoarthritis, unspecified site: Secondary | ICD-10-CM | POA: Diagnosis not present

## 2020-02-21 DIAGNOSIS — E538 Deficiency of other specified B group vitamins: Secondary | ICD-10-CM | POA: Diagnosis not present

## 2020-02-21 DIAGNOSIS — E785 Hyperlipidemia, unspecified: Secondary | ICD-10-CM | POA: Diagnosis not present

## 2020-02-21 DIAGNOSIS — G20 Parkinson's disease: Secondary | ICD-10-CM | POA: Diagnosis not present

## 2020-02-21 DIAGNOSIS — D649 Anemia, unspecified: Secondary | ICD-10-CM | POA: Diagnosis not present

## 2020-02-21 DIAGNOSIS — I509 Heart failure, unspecified: Secondary | ICD-10-CM | POA: Diagnosis not present

## 2020-02-25 DIAGNOSIS — M1990 Unspecified osteoarthritis, unspecified site: Secondary | ICD-10-CM | POA: Diagnosis not present

## 2020-02-25 DIAGNOSIS — E785 Hyperlipidemia, unspecified: Secondary | ICD-10-CM | POA: Diagnosis not present

## 2020-02-25 DIAGNOSIS — R69 Illness, unspecified: Secondary | ICD-10-CM | POA: Diagnosis not present

## 2020-02-25 DIAGNOSIS — E538 Deficiency of other specified B group vitamins: Secondary | ICD-10-CM | POA: Diagnosis not present

## 2020-02-25 DIAGNOSIS — G20 Parkinson's disease: Secondary | ICD-10-CM | POA: Diagnosis not present

## 2020-02-25 DIAGNOSIS — I110 Hypertensive heart disease with heart failure: Secondary | ICD-10-CM | POA: Diagnosis not present

## 2020-02-25 DIAGNOSIS — D649 Anemia, unspecified: Secondary | ICD-10-CM | POA: Diagnosis not present

## 2020-02-25 DIAGNOSIS — G5602 Carpal tunnel syndrome, left upper limb: Secondary | ICD-10-CM | POA: Diagnosis not present

## 2020-02-25 DIAGNOSIS — I509 Heart failure, unspecified: Secondary | ICD-10-CM | POA: Diagnosis not present

## 2020-03-03 DIAGNOSIS — I110 Hypertensive heart disease with heart failure: Secondary | ICD-10-CM | POA: Diagnosis not present

## 2020-03-03 DIAGNOSIS — G5602 Carpal tunnel syndrome, left upper limb: Secondary | ICD-10-CM | POA: Diagnosis not present

## 2020-03-03 DIAGNOSIS — E785 Hyperlipidemia, unspecified: Secondary | ICD-10-CM | POA: Diagnosis not present

## 2020-03-03 DIAGNOSIS — M1990 Unspecified osteoarthritis, unspecified site: Secondary | ICD-10-CM | POA: Diagnosis not present

## 2020-03-03 DIAGNOSIS — D649 Anemia, unspecified: Secondary | ICD-10-CM | POA: Diagnosis not present

## 2020-03-03 DIAGNOSIS — I509 Heart failure, unspecified: Secondary | ICD-10-CM | POA: Diagnosis not present

## 2020-03-03 DIAGNOSIS — R69 Illness, unspecified: Secondary | ICD-10-CM | POA: Diagnosis not present

## 2020-03-03 DIAGNOSIS — G20 Parkinson's disease: Secondary | ICD-10-CM | POA: Diagnosis not present

## 2020-03-03 DIAGNOSIS — E538 Deficiency of other specified B group vitamins: Secondary | ICD-10-CM | POA: Diagnosis not present

## 2020-03-05 ENCOUNTER — Other Ambulatory Visit: Payer: Self-pay

## 2020-03-05 DIAGNOSIS — E785 Hyperlipidemia, unspecified: Secondary | ICD-10-CM | POA: Diagnosis not present

## 2020-03-05 DIAGNOSIS — I509 Heart failure, unspecified: Secondary | ICD-10-CM | POA: Diagnosis not present

## 2020-03-05 DIAGNOSIS — R69 Illness, unspecified: Secondary | ICD-10-CM | POA: Diagnosis not present

## 2020-03-05 DIAGNOSIS — G5602 Carpal tunnel syndrome, left upper limb: Secondary | ICD-10-CM | POA: Diagnosis not present

## 2020-03-05 DIAGNOSIS — E538 Deficiency of other specified B group vitamins: Secondary | ICD-10-CM | POA: Diagnosis not present

## 2020-03-05 DIAGNOSIS — M1990 Unspecified osteoarthritis, unspecified site: Secondary | ICD-10-CM | POA: Diagnosis not present

## 2020-03-05 DIAGNOSIS — G20 Parkinson's disease: Secondary | ICD-10-CM | POA: Diagnosis not present

## 2020-03-05 DIAGNOSIS — I110 Hypertensive heart disease with heart failure: Secondary | ICD-10-CM | POA: Diagnosis not present

## 2020-03-05 DIAGNOSIS — D649 Anemia, unspecified: Secondary | ICD-10-CM | POA: Diagnosis not present

## 2020-03-05 DIAGNOSIS — M62838 Other muscle spasm: Secondary | ICD-10-CM

## 2020-03-05 NOTE — Telephone Encounter (Signed)
Requesting refill on tizanidine for patients adherence packaging. Please send to [facility] pharmacy.  Thanks!  [name], PharmD Clinical Pharmacist [facility] at [facility] [phone]

## 2020-03-06 DIAGNOSIS — F4312 Post-traumatic stress disorder, chronic: Secondary | ICD-10-CM | POA: Diagnosis not present

## 2020-03-06 DIAGNOSIS — R69 Illness, unspecified: Secondary | ICD-10-CM | POA: Diagnosis not present

## 2020-03-06 MED ORDER — TIZANIDINE HCL 4 MG PO TABS: ORAL_TABLET | ORAL | 0 refills | Status: DC

## 2020-03-06 NOTE — Telephone Encounter (Signed)
Last prescribed on 10/07/2019 Last OV (follow up ) with Allie Bossier on 10/30/2019  No future OV scheduled

## 2020-03-06 NOTE — Telephone Encounter (Signed)
Refills sent to pharmacy. 

## 2020-03-09 ENCOUNTER — Other Ambulatory Visit: Payer: Self-pay | Admitting: Family Medicine

## 2020-03-09 DIAGNOSIS — M62838 Other muscle spasm: Secondary | ICD-10-CM

## 2020-03-12 DIAGNOSIS — M1990 Unspecified osteoarthritis, unspecified site: Secondary | ICD-10-CM | POA: Diagnosis not present

## 2020-03-12 DIAGNOSIS — E538 Deficiency of other specified B group vitamins: Secondary | ICD-10-CM | POA: Diagnosis not present

## 2020-03-12 DIAGNOSIS — R69 Illness, unspecified: Secondary | ICD-10-CM | POA: Diagnosis not present

## 2020-03-12 DIAGNOSIS — G5602 Carpal tunnel syndrome, left upper limb: Secondary | ICD-10-CM | POA: Diagnosis not present

## 2020-03-12 DIAGNOSIS — I110 Hypertensive heart disease with heart failure: Secondary | ICD-10-CM | POA: Diagnosis not present

## 2020-03-12 DIAGNOSIS — I509 Heart failure, unspecified: Secondary | ICD-10-CM | POA: Diagnosis not present

## 2020-03-12 DIAGNOSIS — E785 Hyperlipidemia, unspecified: Secondary | ICD-10-CM | POA: Diagnosis not present

## 2020-03-12 DIAGNOSIS — D649 Anemia, unspecified: Secondary | ICD-10-CM | POA: Diagnosis not present

## 2020-03-12 DIAGNOSIS — G20 Parkinson's disease: Secondary | ICD-10-CM | POA: Diagnosis not present

## 2020-03-18 DIAGNOSIS — D649 Anemia, unspecified: Secondary | ICD-10-CM | POA: Diagnosis not present

## 2020-03-18 DIAGNOSIS — E785 Hyperlipidemia, unspecified: Secondary | ICD-10-CM | POA: Diagnosis not present

## 2020-03-18 DIAGNOSIS — I110 Hypertensive heart disease with heart failure: Secondary | ICD-10-CM | POA: Diagnosis not present

## 2020-03-18 DIAGNOSIS — G5602 Carpal tunnel syndrome, left upper limb: Secondary | ICD-10-CM | POA: Diagnosis not present

## 2020-03-18 DIAGNOSIS — R69 Illness, unspecified: Secondary | ICD-10-CM | POA: Diagnosis not present

## 2020-03-18 DIAGNOSIS — I509 Heart failure, unspecified: Secondary | ICD-10-CM | POA: Diagnosis not present

## 2020-03-18 DIAGNOSIS — M1990 Unspecified osteoarthritis, unspecified site: Secondary | ICD-10-CM | POA: Diagnosis not present

## 2020-03-18 DIAGNOSIS — E538 Deficiency of other specified B group vitamins: Secondary | ICD-10-CM | POA: Diagnosis not present

## 2020-03-18 DIAGNOSIS — G20 Parkinson's disease: Secondary | ICD-10-CM | POA: Diagnosis not present

## 2020-03-20 ENCOUNTER — Telehealth: Payer: Self-pay

## 2020-03-20 NOTE — Progress Notes (Addendum)
Chronic Care Management Pharmacy Assistant   Name: Selena Bush  MRN: [id number] DOB: 1946/06/17  Reason for Encounter: Medication Review/General Adherence   Patient Questions:  1.  Have you seen any other providers since your last visit? Yes, 02/12/2020- OV with Neuro  2.  Any changes in your medicines or health? Yes, Increased Sinemet 25/100 2 tabs at breakfast, 1 at lunch, 1 in the afternoon, 1 at night.     PCP : Pleas Koch, NP  Allergies:   Allergies  Allergen Reactions  . Penicillins Anaphylaxis    anaphylaxis  Has patient had a PCN reaction causing immediate rash, facial/tongue/throat swelling, SOB or lightheadedness with hypotension: Yes Has patient had a PCN reaction causing severe rash involving mucus membranes or skin necrosis: No Has patient had a PCN reaction that required hospitalization: No Has patient had a PCN reaction occurring within the last 10 years: No If all of the above answers are "NO", then may proceed with Cephalosporin use.   . Sulfa Antibiotics Anaphylaxis  . Codeine Other (See Comments) and Nausea And Vomiting    Gi problems   . Statins Other (See Comments)    Leg pains  . Aspirin Other (See Comments)    "burned my stomach intensely" Abdominal pain and burning    Medications: Outpatient Encounter Medications as of 03/20/2020  Medication Sig  . carbidopa-levodopa (SINEMET IR) 25-100 MG tablet Take by mouth. Take 2 tablets at breakfast, 1 tab at lunch, 1 in the afternoon and 1 at night.  . diltiazem (CARDIZEM CD) 180 MG 24 hr capsule TAKE ONE TABLET BY MOUTH EVERY MORNING  . DULoxetine (CYMBALTA) 60 MG capsule TAKE 1 CAPSULE BY MOUTH 2 TIMES A DAY for anxiety and depression.  . fluticasone (FLONASE) 50 MCG/ACT nasal spray Place 2 sprays into both nostrils daily as needed for rhinitis or allergies.  . furosemide (LASIX) 40 MG tablet Take 0.5 tablets (20 mg total) by mouth daily.  [name] gabapentin (NEURONTIN) 100 MG capsule TAKE 1 CAPSULE  BY MOUTH THREE TIMES A DAY for anxiety.  . hydrocortisone 2.5 % cream Apply to affected areas face 1-2 times a day as directed and as needed.  [name] ketoconazole (NIZORAL) 2 % cream Apply to affected areas face 1-2 times a day as directed.  [name] ketoconazole (NIZORAL) 2 % shampoo Apply to scalp QOD, let sit several mins before rinsing.  . metoprolol tartrate (LOPRESSOR) 25 MG tablet TAKE 3 TABLETS BY MOUTH 2 TIMES DAILY.  [name] potassium chloride SA (KLOR-CON M20) 20 MEQ tablet Take 1 tablet (20 mEq total) by mouth daily.  . sotalol (BETAPACE) 120 MG tablet Take 1 tablet (120 mg total) by mouth every 12 (twelve) hours.  [name] tiZANidine (ZANAFLEX) 4 MG tablet TAKE 2 TABLET (4 MG TOTAL) BY MOUTH DAILY FOR MUSCLE SPASMS AT BEDTIME   No facility-administered encounter medications on file as of 03/20/2020.    Current Diagnosis: Patient Active Problem List   Diagnosis Date Noted  . Tremor of left hand 07/29/2019  . Cat bite of right hand 03/25/2019  . DDD (degenerative disc disease), lumbar 10/19/2018  . Preventative health care 10/19/2018  . Prediabetes 10/19/2018  . (HFpEF) heart failure with preserved ejection fraction ([place]) 12/12/2017  . Pericardial effusion with cardiac tamponade 11/13/2017  . Paroxysmal atrial fibrillation ([place]) 11/05/2017  . Anxiety 03/21/2016  . Insomnia 07/17/2015  . MI (mitral incompetence) 12/10/2014  . Major depressive disorder 12/09/2014  . OSA (obstructive sleep apnea) 12/09/2014  . GERD (  gastroesophageal reflux disease) 12/09/2014  . Hyperglycemia 12/09/2014  . Chronic diastolic CHF (congestive heart failure) ([place])   . HLD (hyperlipidemia)   . Osteoarthritis of both knees   . S/P left TKA 08/19/2014  . 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 (BMI) 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 ([place]) 04/17/2013  . DCIS (ductal carcinoma in situ) 11/05/2012  . Essential hypertension, benign 11/05/2012    Goals Addressed   None     Follow-Up:  Coordination of Enhanced Pharmacy  Services   Reached out to [place] to discuss medication adherence. No questions or concerns at this time. Rescheduled pharmacy visit for 06/29/2020 at 3:30 PM.  Martinique Uselman, [place] Pharmacist Assistant  [phone]

## 2020-03-23 ENCOUNTER — Telehealth: Payer: Medicare HMO

## 2020-03-25 DIAGNOSIS — I110 Hypertensive heart disease with heart failure: Secondary | ICD-10-CM | POA: Diagnosis not present

## 2020-03-25 DIAGNOSIS — G5602 Carpal tunnel syndrome, left upper limb: Secondary | ICD-10-CM | POA: Diagnosis not present

## 2020-03-25 DIAGNOSIS — R69 Illness, unspecified: Secondary | ICD-10-CM | POA: Diagnosis not present

## 2020-03-25 DIAGNOSIS — G20 Parkinson's disease: Secondary | ICD-10-CM | POA: Diagnosis not present

## 2020-03-25 DIAGNOSIS — D649 Anemia, unspecified: Secondary | ICD-10-CM | POA: Diagnosis not present

## 2020-03-25 DIAGNOSIS — E538 Deficiency of other specified B group vitamins: Secondary | ICD-10-CM | POA: Diagnosis not present

## 2020-03-25 DIAGNOSIS — I509 Heart failure, unspecified: Secondary | ICD-10-CM | POA: Diagnosis not present

## 2020-03-25 DIAGNOSIS — M1990 Unspecified osteoarthritis, unspecified site: Secondary | ICD-10-CM | POA: Diagnosis not present

## 2020-03-25 DIAGNOSIS — E785 Hyperlipidemia, unspecified: Secondary | ICD-10-CM | POA: Diagnosis not present

## 2020-03-26 DIAGNOSIS — R69 Illness, unspecified: Secondary | ICD-10-CM | POA: Diagnosis not present

## 2020-03-26 DIAGNOSIS — F4312 Post-traumatic stress disorder, chronic: Secondary | ICD-10-CM | POA: Diagnosis not present

## 2020-04-01 DIAGNOSIS — G20 Parkinson's disease: Secondary | ICD-10-CM | POA: Diagnosis not present

## 2020-04-01 DIAGNOSIS — R262 Difficulty in walking, not elsewhere classified: Secondary | ICD-10-CM | POA: Diagnosis not present

## 2020-04-01 DIAGNOSIS — R251 Tremor, unspecified: Secondary | ICD-10-CM | POA: Diagnosis not present

## 2020-04-02 ENCOUNTER — Telehealth: Payer: Self-pay

## 2020-04-02 NOTE — Progress Notes (Signed)
Chronic Care Management Pharmacy Assistant   Name: Selena Bush  MRN: [id number] DOB: 1946/02/12  Reason for Encounter: Medication Review  Patient Questions:  1.  Have you seen any other providers since your last visit? Yes, see notes below  2.  Any changes in your medicines or health? Yes, see notes below   02/12/20- Visit to Neurology ([facility]) Dr. Melrose Nakayama for Parkinson's- increased Sinemet 25-100mg  2 tabs at breakfast, 1 at lunch, 1 afternoon, 1 at night. #150.  PCP : Pleas Koch, NP  Allergies:   Allergies  Allergen Reactions  . Penicillins Anaphylaxis    anaphylaxis  Has patient had a PCN reaction causing immediate rash, facial/tongue/throat swelling, SOB or lightheadedness with hypotension: Yes Has patient had a PCN reaction causing severe rash involving mucus membranes or skin necrosis: No Has patient had a PCN reaction that required hospitalization: No Has patient had a PCN reaction occurring within the last 10 years: No If all of the above answers are "NO", then may proceed with Cephalosporin use.   . Sulfa Antibiotics Anaphylaxis  . Codeine Other (See Comments) and Nausea And Vomiting    Gi problems   . Statins Other (See Comments)    Leg pains  . Aspirin Other (See Comments)    "burned my stomach intensely" Abdominal pain and burning    Medications: Outpatient Encounter Medications as of 04/02/2020  Medication Sig  . carbidopa-levodopa (SINEMET IR) 25-100 MG tablet Take by mouth. Take 2 tablets at breakfast, 1 tab at lunch, 1 in the afternoon and 1 at night.  . diltiazem (CARDIZEM CD) 180 MG 24 hr capsule TAKE ONE TABLET BY MOUTH EVERY MORNING  . DULoxetine (CYMBALTA) 60 MG capsule TAKE 1 CAPSULE BY MOUTH 2 TIMES A DAY for anxiety and depression.  . fluticasone (FLONASE) 50 MCG/ACT nasal spray Place 2 sprays into both nostrils daily as needed for rhinitis or allergies.  . furosemide (LASIX) 40 MG tablet Take 0.5 tablets (20 mg total) by mouth daily.  [name]  gabapentin (NEURONTIN) 100 MG capsule TAKE 1 CAPSULE BY MOUTH THREE TIMES A DAY for anxiety.  . hydrocortisone 2.5 % cream Apply to affected areas face 1-2 times a day as directed and as needed.  [name] ketoconazole (NIZORAL) 2 % cream Apply to affected areas face 1-2 times a day as directed.  [name] ketoconazole (NIZORAL) 2 % shampoo Apply to scalp QOD, let sit several mins before rinsing.  . metoprolol tartrate (LOPRESSOR) 25 MG tablet TAKE 3 TABLETS BY MOUTH 2 TIMES DAILY.  [name] potassium chloride SA (KLOR-CON M20) 20 MEQ tablet Take 1 tablet (20 mEq total) by mouth daily.  . sotalol (BETAPACE) 120 MG tablet Take 1 tablet (120 mg total) by mouth every 12 (twelve) hours.  [name] tiZANidine (ZANAFLEX) 4 MG tablet TAKE 2 TABLET (4 MG TOTAL) BY MOUTH DAILY FOR MUSCLE SPASMS AT BEDTIME   No facility-administered encounter medications on file as of 04/02/2020.    Current Diagnosis: Patient Active Problem List   Diagnosis Date Noted  . Tremor of left hand 07/29/2019  . Cat bite of right hand 03/25/2019  . DDD (degenerative disc disease), lumbar 10/19/2018  . Preventative health care 10/19/2018  . Prediabetes 10/19/2018  . (HFpEF) heart failure with preserved ejection fraction ([place]) 12/12/2017  . Pericardial effusion with cardiac tamponade 11/13/2017  . Paroxysmal atrial fibrillation ([place]) 11/05/2017  . Anxiety 03/21/2016  . Insomnia 07/17/2015  . MI (mitral incompetence) 12/10/2014  . Major depressive disorder 12/09/2014  . OSA (  obstructive sleep apnea) 12/09/2014  . GERD (gastroesophageal reflux disease) 12/09/2014  . Hyperglycemia 12/09/2014  . Chronic diastolic CHF (congestive heart failure) ([place])   . HLD (hyperlipidemia)   . Osteoarthritis of both knees   . S/P left TKA 08/19/2014  . 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 (BMI) 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 ([place]) 04/17/2013  . DCIS (ductal carcinoma in situ) 11/05/2012  . Essential hypertension, benign 11/05/2012    Goals Addressed    None     Follow-Up:  Coordination of Enhanced Pharmacy Services   Reviewed chart for medication changes ahead of medication coordination call.  02/12/20- Visit to Neurology ([facility]) Dr. Melrose Nakayama for Parkinson's- increased Sinemet 25-100mg  2 tabs at breakfast, 1 at lunch, 1 afternoon, 1 at night. #150.  BP Readings from Last 3 Encounters:  02/02/20 (!) 161/90  01/18/20 (!) 189/126  01/06/20 (!) 150/70    Lab Results  Component Value Date   HGBA1C 5.6 10/30/2019     Patient obtains medications through Adherence Packaging  30 Days   Last adherence delivery included:  [name] Sotalol 120 mg - 1 BF, 1 evening meal . Diltiazem 180 mg - 1 BF . Metoprolol tartrate 25 mg - 3 BF, 3 Evening meal  . Furosemide 20 mg - 1 BF . Potassium SA 20 mEq - 1 BF . Duloxetine 60 mg - 1 BF, 1 evening meal . Gabapentin 100 mg - 1 BF, 1 evening meal, 1 bedtime . Tizanidine 4 mg - 2 bedtime  Patient declined the following medications last month: [name] Flonase Nasal Spray (PRN)   Patient is due for next adherence delivery on: 04/13/2020. Called patient and reviewed medications and coordinated delivery.  This delivery to include: . Sotalol 120 mg - 1 BF, 1 evening meal . Diltiazem 180 mg - 1 BF . Metoprolol tartrate 25 mg - 3 BF, 3 Evening meal  . Furosemide 20 mg - 1 BF . Potassium SA 20 mEq - 1 BF . Duloxetine 60 mg - 1 BF, 1 evening meal . Gabapentin 100 mg - 1 BF, 1 evening meal, 1 bedtime . Tizanidine 4 mg - 2 bedtime   Patient declined the following medications: . Flonase Nasal Spray (PRN)  Patient does not need any refills at this time.   Confirmed delivery date of 04/13/2020, advised patient that pharmacy will contact them the morning of delivery.  Martinique Uselman, [place] Pharmacist Assistant  [phone]

## 2020-04-04 DIAGNOSIS — R262 Difficulty in walking, not elsewhere classified: Secondary | ICD-10-CM | POA: Insufficient documentation

## 2020-04-05 ENCOUNTER — Other Ambulatory Visit: Payer: Self-pay | Admitting: Primary Care

## 2020-04-05 DIAGNOSIS — R7303 Prediabetes: Secondary | ICD-10-CM

## 2020-04-05 DIAGNOSIS — I10 Essential (primary) hypertension: Secondary | ICD-10-CM

## 2020-04-05 DIAGNOSIS — E7800 Pure hypercholesterolemia, unspecified: Secondary | ICD-10-CM

## 2020-04-08 ENCOUNTER — Telehealth: Payer: Self-pay

## 2020-04-08 NOTE — Progress Notes (Signed)
Unsuccessful outreach to patient for medication adherence call. Could not leave a message, no VM set up. Will try again later.   Martinique Uselman, [place] Pharmacist Assistant  [phone]

## 2020-04-09 ENCOUNTER — Telehealth: Payer: Self-pay

## 2020-04-09 NOTE — Progress Notes (Signed)
Unsuccessful outreach to patient. Left VM for pt to return call.   Martinique Uselman, [place] Pharmacist Assistant  [phone]

## 2020-04-09 NOTE — Progress Notes (Signed)
Unsuccessful outreach to patient. Left message for patient to return call.  Martinique Uselman, [place] Pharmacist Assistant  [phone]

## 2020-04-15 ENCOUNTER — Telehealth: Payer: Self-pay

## 2020-04-15 ENCOUNTER — Ambulatory Visit: Payer: Medicare HMO

## 2020-04-15 ENCOUNTER — Other Ambulatory Visit: Payer: Medicare HMO

## 2020-04-15 ENCOUNTER — Other Ambulatory Visit: Payer: Self-pay

## 2020-04-15 NOTE — Progress Notes (Deleted)
Subjective:   Selena Bush is a 74 y.o. female who presents for Medicare Annual (Subsequent) preventive examination.  Review of Systems: N/A      I connected with the patient today by telephone and verified that I am speaking with the correct person using two identifiers. Location patient: home Location nurse: work Persons participating in the telephone visit: patient, nurse.   I discussed the limitations, risks, security and privacy concerns of performing an evaluation and management service by telephone and the availability of in person appointments. I also discussed with the patient that there may be a patient responsible charge related to this service. The patient expressed understanding and verbally consented to this telephonic visit.              Objective:    There were no vitals filed for this visit. There is no height or weight on file to calculate BMI.  Advanced Directives 01/18/2020 08/31/2018 08/12/2018 01/10/2018 01/01/2018 11/13/2017 11/13/2017  Does Patient Have a Medical Advance Directive? No No No No No No No  Would patient like information on creating a medical advance directive? - - No - Patient declined No - Patient declined No - Patient declined No - Patient declined -  Pre-existing out of facility DNR order (yellow form or pink MOST form) - - - - - - -    Current Medications (verified) Outpatient Encounter Medications as of 04/15/2020  Medication Sig  . carbidopa-levodopa (SINEMET IR) 25-100 MG tablet Take by mouth. Take 2 tablets at breakfast, 1 tab at lunch, 1 in the afternoon and 1 at night.  . diltiazem (CARDIZEM CD) 180 MG 24 hr capsule TAKE ONE TABLET BY MOUTH EVERY MORNING  . DULoxetine (CYMBALTA) 60 MG capsule TAKE 1 CAPSULE BY MOUTH 2 TIMES A DAY for anxiety and depression.  . fluticasone (FLONASE) 50 MCG/ACT nasal spray Place 2 sprays into both nostrils daily as needed for rhinitis or allergies.  . furosemide (LASIX) 40 MG tablet Take 0.5 tablets (20 mg  total) by mouth daily.  [name] gabapentin (NEURONTIN) 100 MG capsule TAKE 1 CAPSULE BY MOUTH THREE TIMES A DAY for anxiety.  . hydrocortisone 2.5 % cream Apply to affected areas face 1-2 times a day as directed and as needed.  [name] ketoconazole (NIZORAL) 2 % cream Apply to affected areas face 1-2 times a day as directed.  [name] ketoconazole (NIZORAL) 2 % shampoo Apply to scalp QOD, let sit several mins before rinsing.  . metoprolol tartrate (LOPRESSOR) 25 MG tablet TAKE 3 TABLETS BY MOUTH 2 TIMES DAILY.  [name] potassium chloride SA (KLOR-CON M20) 20 MEQ tablet Take 1 tablet (20 mEq total) by mouth daily.  . sotalol (BETAPACE) 120 MG tablet Take 1 tablet (120 mg total) by mouth every 12 (twelve) hours.  [name] tiZANidine (ZANAFLEX) 4 MG tablet TAKE 2 TABLET (4 MG TOTAL) BY MOUTH DAILY FOR MUSCLE SPASMS AT BEDTIME   No facility-administered encounter medications on file as of 04/15/2020.    Allergies (verified) Penicillins, Sulfa antibiotics, Codeine, Statins, and Aspirin   History: Past Medical History:  Diagnosis Date  . Anxiety   . Atrial fibrillation ([place])   . Chronic diastolic CHF (congestive heart failure) ([place])    a. echo 09/2014: EF 54-65%, diastolic dysfunction, mild LVH, nl RV size & systolic function, mildly dilated LA (4.3 cm), mild MR/TR, mildly elevated PASP 36.7 mm Hg  . DDD (degenerative disc disease), cervical   . DDD (degenerative disc disease), lumbar   . Depression   .  Diffuse cystic mastopathy 2014  . Dyspnea   . GERD (gastroesophageal reflux disease)   . Gout   . Headache    rare  . HLD (hyperlipidemia)    a. statin intolerant 2/2 myalgias  . Hx of dysplastic nevus 12/25/2018   L medial ankle  . Hypercholesterolemia   . Hypertension   . Malignant neoplasm of upper-outer quadrant of female breast ([place]) 10/2012   Papillary DCIS, sentinel node negative. DR/PR positive. PARTIAL RIGHT MASTECTOMY FOR BREAST CANCER--HAD RADIATION - NO CHEMO --DR. [place] ONCOLOGIST  . Obesity     . OSA on CPAP   . Osteoarthritis of both knees    a. s/p right TKA 04/2013 & left TKA 09/2014  . Otitis externa   . Personal history of radiation therapy 2015   RIGHT breast-mammosite per pt  . Sleep difficulties    LUNESTA HAS HELPED  . Vaginal cyst    Past Surgical History:  Procedure Laterality Date  . ABDOMINAL HYSTERECTOMY  1992   DUB; fibroids; endometriosis.  One remaining ovary.    [name] BREAST LUMPECTOMY Right 2015   Papillary DCIS, sentinel node negative. DR/PR positive. PARTIAL RIGHT MASTECTOMY FOR BREAST CANCER--HAD RADIATION - NO CHEMO --DR. [place] ONCOLOGIST  . BREAST SURGERY Right March 2014   Wide excision,APB RT 10 mm papillary DCIS, ER/PR positive. Sentinel node negative. Partial breast radiation.  [name] CATARACT EXTRACTION, BILATERAL  02/13/2016   [place].  . CHOLECYSTECTOMY  1994  . COLONOSCOPY  2015   1 benign polyp-every 5 years/ Dr Candace Cruise  . ERCP  1995  . JOINT REPLACEMENT Right Sept 2014   knee  . PERICARDIOCENTESIS N/A 11/13/2017   Procedure: PERICARDIOCENTESIS;  Surgeon: Nelva Bush, MD;  Location: [facility];  Service: Cardiovascular;  Laterality: N/A;  . TOTAL KNEE ARTHROPLASTY Right 04/16/2013   Procedure: RIGHT TOTAL KNEE ARTHROPLASTY;  Surgeon: Mauri Pole, MD;  Location: [facility];  Service: Orthopedics;  Laterality: Right;  . TOTAL KNEE ARTHROPLASTY Left 09/15/2014   Procedure: LEFT TOTAL KNEE ARTHROPLASTY;  Surgeon: Mauri Pole, MD;  Location: [facility];  Service: Orthopedics;  Laterality: Left;  . TUBAL LIGATION  1979   Family History  Problem Relation Age of Onset  . Colon cancer Mother   . Cancer Mother 77       colon cancer  . Melanoma Father   . Cancer Father 9       melanoma  . Colon cancer Maternal Grandfather   . Breast cancer Maternal Grandfather   . Melanoma Sister   . Melanoma Sister    Social History   Socioeconomic History  . Marital status: Widowed    Spouse name: Chrissie Noa  . Number of children: 2  . Years of  education: College  . Highest education level: Bachelor's degree (e.g., BA, AB, BS)  Occupational History  . Occupation: Retired  Tobacco Use  . Smoking status: Never Smoker  . Smokeless tobacco: Never Used  . Tobacco comment: social smoker as a teen  Media planner  . Vaping Use: Never used  Substance and Sexual Activity  . Alcohol use: No  . Drug use: No  . Sexual activity: Not Currently    Birth control/protection: Post-menopausal, Surgical  Other Topics Concern  . Not on file  Social History Narrative   Marital status: widowed since 09/16/2015      Children: 2 children (30, 5); 5 grandchild (4 in [place]; 1 in [place]).      Lives: alone in townhome;  1 dog, 2 cats      Employment: psychiatric Education officer, museum; retired in 2015      Tobacco: teenager only      Alcohol:  None      Drugs: none      Exercise: walking in 2019; more active in 2019.  Walking daily small amounts.        ADLs: drives; independent with ADLs; no assistant devices      Advanced Directives: none; FULL CODE; no prolonged measures.   Does not need them.  Daughter/Heather Vista Deck is HCPOA.    Social Determinants of Health   Financial Resource Strain:   . Difficulty of Paying Living Expenses: Not on file  Food Insecurity:   . Worried About Charity fundraiser in the Last Year: Not on file  . Ran Out of Food in the Last Year: Not on file  Transportation Needs:   . Lack of Transportation (Medical): Not on file  . Lack of Transportation (Non-Medical): Not on file  Physical Activity:   . Days of Exercise per Week: Not on file  . Minutes of Exercise per Session: Not on file  Stress:   . Feeling of Stress : Not on file  Social Connections:   . Frequency of Communication with Friends and Family: Not on file  . Frequency of Social Gatherings with Friends and Family: Not on file  . Attends Religious Services: Not on file  . Active Member of Clubs or Organizations: Not on file  . Attends Archivist Meetings:  Not on file  . Marital Status: Not on file    Tobacco Counseling Counseling given: Not Answered Comment: social smoker as a teen   Clinical Intake:                 Diabetic: No Nutrition Risk Assessment:  Has the patient had any N/V/D within the last 2 months?  {YES/NO:21197} Does the patient have any non-healing wounds?  {YES/NO:21197} Has the patient had any unintentional weight loss or weight gain?  {YES/NO:21197}  Diabetes:  Is the patient diabetic?  No  If diabetic, was a CBG obtained today?  N/A Did the patient bring in their glucometer from home?  N/A How often do you monitor your CBG's? N/A.   Financial Strains and Diabetes Management:  Are you having any financial strains with the device, your supplies or your medication? N/A.  Does the patient want to be seen by Chronic Care Management for management of their diabetes?  N/A Would the patient like to be referred to a Nutritionist or for Diabetic Management?  N/A            Activities of Daily Living No flowsheet data found.  Patient Care Team: Pleas Koch, NP as PCP - General (Internal Medicine) Bary Castilla Forest Gleason, MD (General Surgery) Corey Harold, MD as Consulting Physician [name], [facility] as Pharmacist (Pharmacist)  Indicate any recent Medical Services you may have received from other than [facility] providers in the past year (date may be approximate).     Assessment:   This is a routine wellness examination for [place].  Hearing/Vision screen No exam data present  Dietary issues and exercise activities discussed:    Goals    . Pharmacy Care Plan     Current Barriers:  . Chronic Disease Management support, education, and care coordination needs related to hypertension, hyperlipidemia, congestive heart failure, GERD, osteoarthritis, AFIB, major depressive disorder, anxiety  Pharmacist Clinical Goal(s):  [name] Pharmacist  will review medication safety every 6 months . Maintain  blood pressure within goal of < 140/90 mmHg . Maintain average blood glucose (A1c) < 7% . Increase exercise slowly with goal of 150 minutes of moderate activity (such as brisk walking) each week  Interventions: . Comprehensive medication review performed. [facility] pharmacy packaging and delivery services  Patient Self Care Activities:  . Self administers medications as prescribed  Initial goal documentation      Depression Screen PHQ 2/9 Scores 02/14/2018 01/10/2018 01/05/2018 12/29/2017 10/11/2017 10/02/2017 09/18/2017  PHQ - 2 Score 0 0 0 3 0 0 0  PHQ- 9 Score - - - 14 - 1 -    Fall Risk Fall Risk  10/11/2018 03/30/2018 03/13/2018 02/14/2018 02/01/2018  Falls in the past year? 1 (No Data) (No Data) No (No Data)  Comment - Denies new/ recent falls No new recent falls per patient report today - Denies new/ recent falls  Number falls in past yr: - - - - -  Injury with Fall? 1 - - - -  Risk for fall due to : - - (No Data) - -  Risk for fall due to: Comment - - None per patient report today/ fall risk/ prevention education reinforced - -  Follow up - - - - -    Any stairs in or around the home? Yes  If so, are there any without handrails? No  Home free of loose throw rugs in walkways, pet beds, electrical cords, etc? Yes  Adequate lighting in your home to reduce risk of falls? Yes   ASSISTIVE DEVICES UTILIZED TO PREVENT FALLS:  Life alert? {YES/NO:21197} Use of a cane, walker or w/c? {YES/NO:21197} Grab bars in the bathroom? {YES/NO:21197} Shower chair or bench in shower? {YES/NO:21197} Elevated toilet seat or a handicapped toilet? {YES/NO:21197}  TIMED UP AND GO:  Was the test performed? N/A, telephonic visit .    Cognitive Function:      Mini Cog  Mini-Cog screen was completed. Maximum score is 22. A value of 0 denotes this part of the MMSE was not completed or the patient failed this part of the Mini-Cog screening.  6CIT Screen 10/02/2017  What Year? 0 points  What  month? 0 points  What time? 0 points  Count back from 20 0 points  Months in reverse 0 points  Repeat phrase 0 points  Total Score 0    Immunizations Immunization History  Administered Date(s) Administered  . Fluad Quad(high Dose 65+) 06/11/2019  . Influenza, High Dose Seasonal PF 08/10/2014, 05/05/2015, 07/30/2018  . Influenza,inj,Quad PF,6+ Mos 06/07/2016  . PFIZER SARS-COV-2 Vaccination 09/03/2019, 09/24/2019  . Pneumococcal Conjugate-13 02/06/2016  . Pneumococcal Polysaccharide-23 09/22/2014  . Td 09/01/2018  . Zoster Recombinat (Shingrix) 10/19/2018, 12/27/2019    TDAP status: Up to date {Flu Vaccine status:2101806} Pneumococcal vaccine status: Up to date Covid-19 vaccine status: Completed vaccines  Qualifies for Shingles Vaccine? Yes   Zostavax completed No   Shingrix Completed?: Yes  Screening Tests Health Maintenance  Topic Date Due  . DEXA SCAN  Never done  . INFLUENZA VACCINE  03/15/2020  . MAMMOGRAM  11/10/2021  . COLONOSCOPY  07/24/2024  . TETANUS/TDAP  09/01/2028  . COVID-19 Vaccine  Completed  . Hepatitis C Screening  Completed  . PNA vac Low Risk Adult  Completed    Health Maintenance  Health Maintenance Due  Topic Date Due  . DEXA SCAN  Never done  . INFLUENZA VACCINE  03/15/2020    Colorectal cancer  screening: Completed 07/24/2014. Repeat every 10 years Mammogram status: Completed 11/11/2019. Repeat every year {Bone Density status:21018021}  Lung Cancer Screening: (Low Dose CT Chest recommended if Age 47-80 years, 30 pack-year currently smoking OR have quit w/in 15 years.) does not qualify.     Additional Screening:  Hepatitis C Screening: does qualify; Completed 06/07/2016  Vision Screening: Recommended annual ophthalmology exams for early detection of glaucoma and other disorders of the eye. Is the patient up to date with their annual eye exam?  {YES/NO:21197} Who is the provider or what is the name of the office in which the patient  attends annual eye exams? *** If pt is not established with a provider, would they like to be referred to a provider to establish care? No .   Dental Screening: Recommended annual dental exams for proper oral hygiene  Community Resource Referral / Chronic Care Management: CRR required this visit?  No   CCM required this visit?  No      Plan:     I have personally reviewed and noted the following in the patient's chart:   . Medical and social history . Use of alcohol, tobacco or illicit drugs  . Current medications and supplements . Functional ability and status . Nutritional status . Physical activity . Advanced directives . List of other physicians . Hospitalizations, surgeries, and ER visits in previous 12 months . Vitals . Screenings to include cognitive, depression, and falls . Referrals and appointments  In addition, I have reviewed and discussed with patient certain preventive protocols, quality metrics, and best practice recommendations. A written personalized care plan for preventive services as well as general preventive health recommendations were provided to patient.   Due to this being a telephonic visit, the after visit summary with patients personalized plan was offered to patient via mail or my-chart.  Patient preferred to pick up at office at next visit.   Andrez Grime, LPN   08/19/4006

## 2020-04-15 NOTE — Telephone Encounter (Signed)
Called patient 4 times trying to complete her medicare visit. Patient never answered. Left message on voicemail notifying patient I called and to call the office to reschedule appointment at her convenience.

## 2020-04-22 ENCOUNTER — Encounter: Payer: Medicare HMO | Admitting: Primary Care

## 2020-05-08 ENCOUNTER — Telehealth: Payer: Self-pay

## 2020-05-08 NOTE — Chronic Care Management (AMB) (Signed)
Chronic Care Management Pharmacy Assistant   Name: Selena Bush  MRN: [id number] DOB: [birth year]  Reason for Encounter: Medication Review / Monthly Dispensing Call  PCP : Pleas Koch, NP  Allergies:   Allergies  Allergen Reactions  . Penicillins Anaphylaxis    anaphylaxis  Has patient had a PCN reaction causing immediate rash, facial/tongue/throat swelling, SOB or lightheadedness with hypotension: Yes Has patient had a PCN reaction causing severe rash involving mucus membranes or skin necrosis: No Has patient had a PCN reaction that required hospitalization: No Has patient had a PCN reaction occurring within the last 10 years: No If all of the above answers are "NO", then may proceed with Cephalosporin use.   . Sulfa Antibiotics Anaphylaxis  . Codeine Other (See Comments) and Nausea And Vomiting    Gi problems   . Statins Other (See Comments)    Leg pains  . Aspirin Other (See Comments)    "burned my stomach intensely" Abdominal pain and burning    Medications: Outpatient Encounter Medications as of 05/08/2020  Medication Sig  . carbidopa-levodopa (SINEMET IR) 25-100 MG tablet Take by mouth. Take 2 tablets at breakfast, 1 tab at lunch, 1 in the afternoon and 1 at night.  . diltiazem (CARDIZEM CD) 180 MG 24 hr capsule TAKE ONE TABLET BY MOUTH EVERY MORNING  . DULoxetine (CYMBALTA) 60 MG capsule TAKE 1 CAPSULE BY MOUTH 2 TIMES A DAY for anxiety and depression.  . fluticasone (FLONASE) 50 MCG/ACT nasal spray Place 2 sprays into both nostrils daily as needed for rhinitis or allergies.  . furosemide (LASIX) 40 MG tablet Take 0.5 tablets (20 mg total) by mouth daily.  [name] gabapentin (NEURONTIN) 100 MG capsule TAKE 1 CAPSULE BY MOUTH THREE TIMES A DAY for anxiety.  . hydrocortisone 2.5 % cream Apply to affected areas face 1-2 times a day as directed and as needed.  [name] ketoconazole (NIZORAL) 2 % cream Apply to affected areas face 1-2 times a day as directed.  [name]  ketoconazole (NIZORAL) 2 % shampoo Apply to scalp QOD, let sit several mins before rinsing.  . metoprolol tartrate (LOPRESSOR) 25 MG tablet TAKE 3 TABLETS BY MOUTH 2 TIMES DAILY.  [name] potassium chloride SA (KLOR-CON M20) 20 MEQ tablet Take 1 tablet (20 mEq total) by mouth daily.  . sotalol (BETAPACE) 120 MG tablet Take 1 tablet (120 mg total) by mouth every 12 (twelve) hours.  [name] tiZANidine (ZANAFLEX) 4 MG tablet TAKE 2 TABLET (4 MG TOTAL) BY MOUTH DAILY FOR MUSCLE SPASMS AT BEDTIME   No facility-administered encounter medications on file as of 05/08/2020.    Current Diagnosis: Patient Active Problem List   Diagnosis Date Noted  . Tremor of left hand 07/29/2019  . Cat bite of right hand 03/25/2019  . DDD (degenerative disc disease), lumbar 10/19/2018  . Preventative health care 10/19/2018  . Prediabetes 10/19/2018  . (HFpEF) heart failure with preserved ejection fraction ([place]) 12/12/2017  . Pericardial effusion with cardiac tamponade 11/13/2017  . Paroxysmal atrial fibrillation ([place]) 11/05/2017  . Anxiety 03/21/2016  . Insomnia 07/17/2015  . MI (mitral incompetence) 12/10/2014  . Major depressive disorder 12/09/2014  . OSA (obstructive sleep apnea) 12/09/2014  . GERD (gastroesophageal reflux disease) 12/09/2014  . Hyperglycemia 12/09/2014  . Chronic diastolic CHF (congestive heart failure) ([place])   . HLD (hyperlipidemia)   . Osteoarthritis of both knees   . S/P left TKA 08/19/2014  . 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 (BMI) of 40 or  greater ([place]) 04/17/2013  . DCIS (ductal carcinoma in situ) 11/05/2012  . Essential hypertension, benign 11/05/2012    Goals Addressed   None    Reviewed chart for medication changes ahead of medication coordination call.  No OVs, Consults, or hospital visits since last care coordination call 04/02/2020.  Patient saw Dr Melrose Nakayama (Neurology) on 8/18/202, Patient was given Gabapentin 300 mg to take 1 tablet at bedtime.   BP  Readings from Last 3 Encounters:  02/02/20 (!) 161/90  01/18/20 (!) 189/126  01/06/20 (!) 150/70    Lab Results  Component Value Date   HGBA1C 5.6 10/30/2019     Patient obtains medications through Adherence Packaging  30 Days   Last adherence delivery included: Sotalol 120 mg - 1 tablet twice daily (breakfast and  evening meal), Diltiazem 180 mg - 1 tablet daily (breakfast), Metoprolol tartrate 25 mg - 3 tablets twice a day (breakfast and evening meal), Furosemide 20 mg - 1 tablet daily (breakfast), Potassium SA 20 mEq - 1 tablet daily (breakfast), Duloxetine 60 mg - 1 tablet twice daily (breakfast and evening meal), Gabapentin 100 mg - 1 tablet three times daily (breakfast, evening meal and bedtime), Tizanidine 4 mg - 2 tablets daily (bedtime).  Patient declined Flonase Nasal Spray last month due to PRN.  Patient is due for next adherence delivery on: 05/11/2020. Called patient and reviewed medications and coordinated delivery.  This delivery to include: Sotalol 120 mg - 1 tablet twice daily (breakfast and  evening meal), Diltiazem 180 mg - 1 tablet daily (breakfast), Metoprolol tartrate 25 mg - 3 tablets twice a day (breakfast and evening meal), Furosemide 20 mg - 1 tablet daily (breakfast), Potassium SA 20 mEq - 1 tablet daily (breakfast), Duloxetine 60 mg - 1 tablet twice daily (breakfast and evening meal), Tizanidine 4 mg - 2 tablets daily (bedtime).  Patient declined the following medications: Gabapentin 300 mg 1 capsule at bedtime and Gabapentin 100 mg 1 capsule three times a day due to a receiving a supply from [facility] on 04/01/2020. Patient would like to get Gabapentin 100 mg and 300 mg added to packaging. She has #20 on hand of Gabapentin 300 mg from [facility] and #78 on hand of Gabapentin 100 mg   Patient needs refills for Sotalol 120 mg - 1 tablet twice daily (breakfast and  evening meal), Diltiazem 180 mg - 1 tablet daily (breakfast), Metoprolol tartrate 25 mg - 3 tablets twice a day  (breakfast and evening meal), Furosemide 20 mg - 1 tablet daily (breakfast), Potassium SA 20 mEq - 1 tablet daily (breakfast), Duloxetine 60 mg - 1 tablet twice daily (breakfast and evening meal), Tizanidine 4 mg - 2 tablets daily (bedtime).   Confirmed delivery date of 05/11/2020, advised patient that pharmacy will contact them the morning of delivery.  Follow-Up:  Coordination of Enhanced Pharmacy Services and Pharmacist Review Patient is getting medications from [facility]- Dr Melrose Nakayama (Neurologist) sent Gabapentin 300 mg 1 capsule at bedtime and Gabapentin 100 mg 1 capsule three times a day. Will call [facility] to transfer refills for future fills.  Patient would like to get Gabapentin 100 mg and 300 mg added to packaging. She has #20 on hand of Gabapentin 300 mg from [facility] and #78 on hand of Gabapentin 100 mg from [facility]. Patient will need Gabapentin 300 mg refilled before next adherence delivery; acute fill form created for 05/28/2020 to ensure there is adherence to patients drug therapy.   Patient is out of town also and would  like this delivery to arrive on 05/12/2020. [facility] notified, Donette Larry, CPP notified.  Pattricia Boss, [place] Pharmacist Assistant [phone]

## 2020-05-12 ENCOUNTER — Other Ambulatory Visit: Payer: Medicare HMO

## 2020-05-13 DIAGNOSIS — R69 Illness, unspecified: Secondary | ICD-10-CM | POA: Diagnosis not present

## 2020-05-13 DIAGNOSIS — F4312 Post-traumatic stress disorder, chronic: Secondary | ICD-10-CM | POA: Diagnosis not present

## 2020-05-18 ENCOUNTER — Encounter: Payer: Medicare HMO | Admitting: Primary Care

## 2020-05-21 DIAGNOSIS — G4733 Obstructive sleep apnea (adult) (pediatric): Secondary | ICD-10-CM | POA: Diagnosis not present

## 2020-06-01 ENCOUNTER — Other Ambulatory Visit (INDEPENDENT_AMBULATORY_CARE_PROVIDER_SITE_OTHER): Payer: Medicare HMO

## 2020-06-01 ENCOUNTER — Other Ambulatory Visit: Payer: Self-pay

## 2020-06-01 DIAGNOSIS — E7800 Pure hypercholesterolemia, unspecified: Secondary | ICD-10-CM

## 2020-06-01 DIAGNOSIS — R7303 Prediabetes: Secondary | ICD-10-CM

## 2020-06-01 DIAGNOSIS — I10 Essential (primary) hypertension: Secondary | ICD-10-CM | POA: Diagnosis not present

## 2020-06-01 LAB — BASIC METABOLIC PANEL
BUN: 17 mg/dL (ref 6–23)
CO2: 32 mEq/L (ref 19–32)
Calcium: 9.9 mg/dL (ref 8.4–10.5)
Chloride: 98 mEq/L (ref 96–112)
Creatinine, Ser: 0.99 mg/dL (ref 0.40–1.20)
GFR: 56.91 mL/min — ABNORMAL LOW (ref >60.00)
Glucose, Bld: 101 mg/dL — ABNORMAL HIGH (ref 70–99)
Potassium: 4.3 mEq/L (ref 3.5–5.1)
Sodium: 138 mEq/L (ref 135–145)

## 2020-06-01 LAB — LIPID PANEL
Cholesterol: 217 mg/dL — ABNORMAL HIGH (ref 0–200)
HDL: 53.3 mg/dL (ref >39.00)
LDL Cholesterol: 132 mg/dL — ABNORMAL HIGH (ref 0–99)
NonHDL: 163.7
Total CHOL/HDL Ratio: 4
Triglycerides: 161 mg/dL — ABNORMAL HIGH (ref 0.0–149.0)
VLDL: 32.2 mg/dL (ref 0.0–40.0)

## 2020-06-01 LAB — HEMOGLOBIN A1C: Hgb A1c MFr Bld: 5.9 % (ref 4.6–6.5)

## 2020-06-08 ENCOUNTER — Telehealth: Payer: Self-pay

## 2020-06-08 ENCOUNTER — Ambulatory Visit (INDEPENDENT_AMBULATORY_CARE_PROVIDER_SITE_OTHER): Payer: Medicare HMO | Admitting: Primary Care

## 2020-06-08 ENCOUNTER — Encounter: Payer: Self-pay | Admitting: Primary Care

## 2020-06-08 ENCOUNTER — Other Ambulatory Visit: Payer: Self-pay

## 2020-06-08 VITALS — BP 124/68 | HR 54 | Temp 96.9°F | Ht 60.5 in | Wt 187.5 lb

## 2020-06-08 DIAGNOSIS — R69 Illness, unspecified: Secondary | ICD-10-CM | POA: Diagnosis not present

## 2020-06-08 DIAGNOSIS — Z86000 Personal history of in-situ neoplasm of breast: Secondary | ICD-10-CM | POA: Diagnosis not present

## 2020-06-08 DIAGNOSIS — I480 Paroxysmal atrial fibrillation: Secondary | ICD-10-CM

## 2020-06-08 DIAGNOSIS — I5032 Chronic diastolic (congestive) heart failure: Secondary | ICD-10-CM

## 2020-06-08 DIAGNOSIS — G4733 Obstructive sleep apnea (adult) (pediatric): Secondary | ICD-10-CM

## 2020-06-08 DIAGNOSIS — G20 Parkinson's disease: Secondary | ICD-10-CM

## 2020-06-08 DIAGNOSIS — Z0000 Encounter for general adult medical examination without abnormal findings: Secondary | ICD-10-CM

## 2020-06-08 DIAGNOSIS — M5136 Other intervertebral disc degeneration, lumbar region: Secondary | ICD-10-CM | POA: Diagnosis not present

## 2020-06-08 DIAGNOSIS — I10 Essential (primary) hypertension: Secondary | ICD-10-CM | POA: Diagnosis not present

## 2020-06-08 DIAGNOSIS — Z23 Encounter for immunization: Secondary | ICD-10-CM

## 2020-06-08 DIAGNOSIS — K219 Gastro-esophageal reflux disease without esophagitis: Secondary | ICD-10-CM | POA: Diagnosis not present

## 2020-06-08 DIAGNOSIS — R7303 Prediabetes: Secondary | ICD-10-CM

## 2020-06-08 DIAGNOSIS — F419 Anxiety disorder, unspecified: Secondary | ICD-10-CM

## 2020-06-08 NOTE — Progress Notes (Signed)
Chronic Care Management Pharmacy Assistant   Name: Selena Bush  MRN: [id number] DOB: [birth year]  Reason for Encounter: Medication Review  Patient Questions:  1.  Have you seen any other providers since your last visit? Yes, Selena Bush  2.  Any changes in your medicines or health? No   Selena Bush,  74 y.o. , female presents for their Follow-Up CCM visit with the clinical pharmacist via telephone.  PCP : Selena Koch, NP  Allergies:   Allergies  Allergen Reactions  . Penicillins Anaphylaxis    anaphylaxis  Has patient had a PCN reaction causing immediate rash, facial/tongue/throat swelling, SOB or lightheadedness with hypotension: Yes Has patient had a PCN reaction causing severe rash involving mucus membranes or skin necrosis: No Has patient had a PCN reaction that required hospitalization: No Has patient had a PCN reaction occurring within the last 10 years: No If all of the above answers are "NO", then may proceed with Cephalosporin use.   . Sulfa Antibiotics Anaphylaxis  . Codeine Other (See Comments) and Nausea And Vomiting    Gi problems   . Statins Other (See Comments)    Leg pains  . Aspirin Other (See Comments)    "burned my stomach intensely" Abdominal pain and burning    Medications: Outpatient Encounter Medications as of 06/08/2020  Medication Sig  . carbidopa-levodopa (SINEMET IR) 25-100 MG tablet Take by mouth. Take 2 tablets at breakfast, 1 tab at lunch, 1 in the afternoon and 1 at night.  . diltiazem (CARDIZEM CD) 180 MG 24 hr capsule TAKE ONE TABLET BY MOUTH EVERY MORNING  . DULoxetine (CYMBALTA) 60 MG capsule TAKE 1 CAPSULE BY MOUTH 2 TIMES A DAY for anxiety and depression.  . fluticasone (FLONASE) 50 MCG/ACT nasal spray Place 2 sprays into both nostrils daily as needed for rhinitis or allergies.  . furosemide (LASIX) 40 MG tablet Take 0.5 tablets (20 mg total) by mouth daily.  [name] gabapentin (NEURONTIN) 100 MG capsule TAKE 1 CAPSULE BY MOUTH  THREE TIMES A DAY for anxiety.  . gabapentin (NEURONTIN) 300 MG capsule Take 300 mg by mouth at bedtime.  . hydrocortisone 2.5 % cream Apply to affected areas face 1-2 times a day as directed and as needed.  [name] ketoconazole (NIZORAL) 2 % cream Apply to affected areas face 1-2 times a day as directed.  [name] ketoconazole (NIZORAL) 2 % shampoo Apply to scalp QOD, let sit several mins before rinsing.  . metoprolol tartrate (LOPRESSOR) 25 MG tablet TAKE 3 TABLETS BY MOUTH 2 TIMES DAILY.  [name] potassium chloride SA (KLOR-CON M20) 20 MEQ tablet Take 1 tablet (20 mEq total) by mouth daily.  . sotalol (BETAPACE) 120 MG tablet Take 1 tablet (120 mg total) by mouth every 12 (twelve) hours.  [name] tiZANidine (ZANAFLEX) 4 MG tablet TAKE 2 TABLET (4 MG TOTAL) BY MOUTH DAILY FOR MUSCLE SPASMS AT BEDTIME   No facility-administered encounter medications on file as of 06/08/2020.    Current Diagnosis: Patient Active Problem List   Diagnosis Date Noted  . Parkinson's disease ([place]) 07/29/2019  . Cat bite of right hand 03/25/2019  . DDD (degenerative disc disease), lumbar 10/19/2018  . Preventative health care 10/19/2018  . Prediabetes 10/19/2018  . (HFpEF) heart failure with preserved ejection fraction ([place]) 12/12/2017  . Pericardial effusion with cardiac tamponade 11/13/2017  . Paroxysmal atrial fibrillation ([place]) 11/05/2017  . Anxiety 03/21/2016  . Insomnia 07/17/2015  . MI (mitral incompetence) 12/10/2014  . Major depressive disorder 12/09/2014  .  OSA (obstructive sleep apnea) 12/09/2014  . GERD (gastroesophageal reflux disease) 12/09/2014  . Hyperglycemia 12/09/2014  . Chronic diastolic CHF (congestive heart failure) ([place])   . HLD (hyperlipidemia)   . Osteoarthritis of both knees   . S/P left TKA 08/19/2014  . 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 (BMI) 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 ([place]) 04/17/2013  . History of ductal carcinoma in situ (DCIS) of breast 11/05/2012  . Essential hypertension, benign  11/05/2012    Goals Addressed   None     Follow-Up:  Coordination of Enhanced Pharmacy Services  .[name] Reviewed chart for medication changes ahead of medication coordination call.  Pt had wellness visit with Selena Friendly NP on 06/08/2020. No medication changes indicated  BP Readings from Last 3 Encounters:  06/08/20 124/68  02/02/20 (!) 161/90  01/18/20 (!) 189/126    Lab Results  Component Value Date   HGBA1C 5.9 06/01/2020     Patient obtains medications through Adherence Packaging  30 Days   Last adherence delivery included: Sotalol 120 mg - 1 tablet twice daily (breakfast and  evening meal), Diltiazem 180 mg -1 tablet daily (breakfast), Metoprolol tartrate 25 mg -3 tablets twice a day (breakfast and evening meal), Furosemide 20 mg -1 tablet daily (breakfast), Potassium SA 20 mEq -1 tablet daily (breakfast), Duloxetine 60 mg -1 tablet twice daily (breakfast and evening meal), Tizanidine 4 mg -2 tablets daily (bedtime).  Patient declined (meds) last month due to Gabapentin 300 mg 1 capsule at bedtime and Gabapentin 100 mg 1 capsule three times a day due to a receiving a supply from [facility] on 04/01/2020.  Patient is due for next adherence delivery on: 07/09/2020. Called patient and reviewed medications and coordinated delivery.  This delivery to include: Sotalol 120 mg - 1 tablet twice daily (breakfast and  evening meal), Diltiazem 180 mg -1 tablet daily (breakfast), Metoprolol tartrate 25 mg -3 tablets twice a day (breakfast and evening meal), Furosemide 20 mg -1 tablet daily (breakfast), Potassium SA 20 mEq -1 tablet daily (breakfast), Duloxetine 60 mg -1 tablet twice daily (breakfast and evening meal), Tizanidine 4 mg -2 tablets daily (bedtime),Gabapentin 300 mg 1 capsule at bedtime and Gabapentin 100 mg 1 capsule three times a day(breakfast,lunch, evening meal)   Coordinated acute fill for (med) to be delivered (date). Patient needs refills for Duloxetine and  Gabapentin 300mg .  Confirmed delivery date of 06/10/2020, advised patient that pharmacy will contact them the morning of delivery.

## 2020-06-08 NOTE — Assessment & Plan Note (Signed)
Recent increase in A1C, but no worse that prior years. Commended her on weight loss. Encouraged regular exercise.

## 2020-06-08 NOTE — Assessment & Plan Note (Signed)
New diagnosis as of this Summer. Following with neurology and overall doing well. Continue carbidopa-levodopa 25-100 mg TID.

## 2020-06-08 NOTE — Assessment & Plan Note (Signed)
Influenza vaccination provided today. Other vaccinations UTD. Mammogram UTD. Bone density scan due, she would like to defer until next year until due for mammogram. Colonoscopy UTD per patient, she will double check.  Discussed the importance of a healthy diet and regular exercise in order for weight loss, and to reduce the risk of any potential medical problems.  Exam today stable. Labs reviewed.

## 2020-06-08 NOTE — Assessment & Plan Note (Signed)
Controlled in the office today.  Continue current regimen.

## 2020-06-08 NOTE — Patient Instructions (Addendum)
Continue to work on a [employer] and regular exercise.   Please update me regarding the due date of your colonoscopy.  We will complete your bone density scan next year along with your mammogram.  It was a pleasure to see you today!   Preventive Care 36 Years and Older, Female Preventive care refers to lifestyle choices and visits with your health care provider that can promote health and wellness. This includes:  A yearly physical exam. This is also called an annual well check.  Regular dental and eye exams.  Immunizations.  Screening for certain conditions.  Healthy lifestyle choices, such as diet and exercise. What can I expect for my preventive care visit? Physical exam Your health care provider will check:  Height and weight. These may be used to calculate body mass index (BMI), which is a measurement that tells if you are at a healthy weight.  Heart rate and blood pressure.  Your skin for abnormal spots. Counseling Your health care provider may ask you questions about:  Alcohol, tobacco, and drug use.  Emotional well-being.  Home and relationship well-being.  Sexual activity.  Eating habits.  History of falls.  Memory and ability to understand (cognition).  Work and work Statistician.  Pregnancy and menstrual history. What immunizations do I need?  Influenza (flu) vaccine  This is recommended every year. Tetanus, diphtheria, and pertussis (Tdap) vaccine  You may need a Td booster every 10 years. Varicella (chickenpox) vaccine  You may need this vaccine if you have not already been vaccinated. Zoster (shingles) vaccine  You may need this after age 72. Pneumococcal conjugate (PCV13) vaccine  One dose is recommended after age 72. Pneumococcal polysaccharide (PPSV23) vaccine  One dose is recommended after age 72. Measles, mumps, and rubella (MMR) vaccine  You may need at least one dose of MMR if you were born in 1957 or later. You may also  need a second dose. Meningococcal conjugate (MenACWY) vaccine  You may need this if you have certain conditions. Hepatitis A vaccine  You may need this if you have certain conditions or if you travel or work in places where you may be exposed to hepatitis A. Hepatitis B vaccine  You may need this if you have certain conditions or if you travel or work in places where you may be exposed to hepatitis B. Haemophilus influenzae type b (Hib) vaccine  You may need this if you have certain conditions. You may receive vaccines as individual doses or as more than one vaccine together in one shot (combination vaccines). Talk with your health care provider about the risks and benefits of combination vaccines. What tests do I need? Blood tests  Lipid and cholesterol levels. These may be checked every 5 years, or more frequently depending on your overall health.  Hepatitis C test.  Hepatitis B test. Screening  Lung cancer screening. You may have this screening every year starting at age 72 if you have a 30-pack-year history of smoking and currently smoke or have quit within the past 15 years.  Colorectal cancer screening. All adults should have this screening starting at age 72 and continuing until age 72. Your health care provider may recommend screening at age 72 if you are at increased risk. You will have tests every 1-10 years, depending on your results and the type of screening test.  Diabetes screening. This is done by checking your blood sugar (glucose) after you have not eaten for a while (fasting). You may have this done  every 1-3 years.  Mammogram. This may be done every 1-2 years. Talk with your health care provider about how often you should have regular mammograms.  BRCA-related cancer screening. This may be done if you have a family history of breast, ovarian, tubal, or peritoneal cancers. Other tests  Sexually transmitted disease (STD) testing.  Bone density scan. This is done  to screen for osteoporosis. You may have this done starting at age 72. Follow these instructions at home: Eating and drinking  Eat a diet that includes fresh fruits and vegetables, whole grains, lean protein, and low-fat dairy products. Limit your intake of foods with high amounts of sugar, saturated fats, and salt.  Take vitamin and mineral supplements as recommended by your health care provider.  Do not drink alcohol if your health care provider tells you not to drink.  If you drink alcohol: ? Limit how much you have to 0-1 drink a day. ? Be aware of how much alcohol is in your drink. In the [place] one drink equals one 12 oz bottle of beer (355 mL), one 5 oz glass of wine (148 mL), or one 1 oz glass of hard liquor (44 mL). Lifestyle  Take daily care of your teeth and gums.  Stay active. Exercise for at least 30 minutes on 5 or more days each week.  Do not use any products that contain nicotine or tobacco, such as cigarettes, e-cigarettes, and chewing tobacco. If you need help quitting, ask your health care provider.  If you are sexually active, practice safe sex. Use a condom or other form of protection in order to prevent STIs (sexually transmitted infections).  Talk with your health care provider about taking a low-dose aspirin or statin. What's next?  Go to your health care provider once a year for a well check visit.  Ask your health care provider how often you should have your eyes and teeth checked.  Stay up to date on all vaccines. This information is not intended to replace advice given to you by your health care provider. Make sure you discuss any questions you have with your health care provider. Document Revised: 07/26/2018 Document Reviewed: 07/26/2018 Elsevier Patient Education  2020 [employer].

## 2020-06-08 NOTE — Assessment & Plan Note (Signed)
Compliant to CPAP machine nightly. Continue same. 

## 2020-06-08 NOTE — Assessment & Plan Note (Signed)
Appears euvolemic today. Following with cardiology. Continue sotalol 120 mg BID, metoprolol 75 mg TID, diltiazem CD 180 mg daily, furosemide 20 mg daily.

## 2020-06-08 NOTE — Assessment & Plan Note (Signed)
Rate and rhythm regular. Follows with cardiology. Continue current regimen.

## 2020-06-08 NOTE — Progress Notes (Signed)
Subjective:    Patient ID: Selena Bush, female    DOB: 03-19-1946, 74 y.o.   MRN: [id number]  HPI  This visit occurred during the SARS-CoV-2 public health emergency.  Safety protocols were in place, including screening questions prior to the visit, additional usage of staff PPE, and extensive cleaning of exam room while observing appropriate contact time as indicated for disinfecting solutions.   Selena Bush is a 74 year old female who presents today for complete physical.  Immunizations: -Tetanus: Completed in 2020 -Influenza: Due -Shingles: Completed -Pneumonia: Completed series -Covid-19: Completed series  Diet: She endorses a healthy diet.  Exercise: She is walking some. Diagnosed with Parkinson's   Eye exam: Due. Dental exam: Completes semi-annually   Mammogram: Completed in 2021 Dexa: Due, declines  Colonoscopy: Completed in 2015 Hep C Screen: Negative  BP Readings from Last 3 Encounters:  06/08/20 124/68  02/02/20 (!) 161/90  01/18/20 (!) 189/126   Wt Readings from Last 3 Encounters:  06/08/20 187 lb 8 oz (85 kg)  02/02/20 200 lb (90.7 kg)  01/18/20 200 lb (90.7 kg)   The 10-year ASCVD risk score Mikey Bussing DC Jr., et al., 2013) is: 14.9%   Values used to calculate the score:     Age: 17 years     Sex: Female     Is Non-Hispanic African American: No     Diabetic: No     Tobacco smoker: No     Systolic Blood Pressure: 237 mmHg     Is BP treated: Yes     HDL Cholesterol: 53.3 mg/dL     Total Cholesterol: 217 mg/dL   Review of Systems  Constitutional: Negative for unexpected weight change.  HENT: Negative for rhinorrhea.   Eyes: Negative for visual disturbance.  Respiratory: Negative for cough and shortness of breath.   Cardiovascular: Negative for chest pain.  Gastrointestinal: Positive for constipation. Negative for diarrhea.  Genitourinary: Negative for difficulty urinating.  Musculoskeletal: Positive for arthralgias and myalgias.  Skin: Negative for  rash.  Allergic/Immunologic: Negative for environmental allergies.  Neurological: Negative for dizziness, numbness and headaches.  Psychiatric/Behavioral: The patient is not nervous/anxious.        Past Medical History:  Diagnosis Date   Anxiety    Atrial fibrillation (HCC)    Chronic diastolic CHF (congestive heart failure) ([place])    a. echo 09/2014: EF 62-83%, diastolic dysfunction, mild LVH, nl RV size & systolic function, mildly dilated LA (4.3 cm), mild MR/TR, mildly elevated PASP 36.7 mm Hg   DDD (degenerative disc disease), cervical    DDD (degenerative disc disease), lumbar    Depression    Diffuse cystic mastopathy 2014   Dyspnea    GERD (gastroesophageal reflux disease)    Gout    Headache    rare   HLD (hyperlipidemia)    a. statin intolerant 2/2 myalgias   Hx of dysplastic nevus 12/25/2018   L medial ankle   Hypercholesterolemia    Hypertension    Malignant neoplasm of upper-outer quadrant of female breast ([place]) 10/2012   Papillary DCIS, sentinel node negative. DR/PR positive. PARTIAL RIGHT MASTECTOMY FOR BREAST CANCER--HAD RADIATION - NO CHEMO --DR. CRYSTAL [place] ONCOLOGIST   Obesity    OSA on CPAP    Osteoarthritis of both knees    a. s/p right TKA 04/2013 & left TKA 09/2014   Otitis externa    Personal history of radiation therapy 2015   RIGHT breast-mammosite per pt   Sleep difficulties  LUNESTA HAS HELPED   Vaginal cyst      Social History   Socioeconomic History   Marital status: Widowed    Spouse name: Chrissie Noa   Number of children: 2   Years of education: College   Highest education level: Bachelor's degree (e.g., BA, AB, BS)  Occupational History   Occupation: Retired  Tobacco Use   Smoking status: Never Smoker   Smokeless tobacco: Never Used   Tobacco comment: social smoker as a teen  Scientific laboratory technician Use: Never used  Substance and Sexual Activity   Alcohol use: No   Drug use: No   Sexual  activity: Not Currently    Birth control/protection: Post-menopausal, Surgical  Other Topics Concern   Not on file  Social History Narrative   Marital status: widowed since 09/16/2015      Children: 2 children (30, 10); 5 grandchild (4 in [place]; 1 in [place]).      Lives: alone in townhome; 1 dog, 2 cats      Employment: psychiatric Education officer, museum; retired in 2015      Tobacco: teenager only      Alcohol:  None      Drugs: none      Exercise: walking in 2019; more active in 2019.  Walking daily small amounts.        ADLs: drives; independent with ADLs; no assistant devices      Advanced Directives: none; FULL CODE; no prolonged measures.   Does not need them.  Daughter/Heather Vista Deck is HCPOA.    Social Determinants of Health   Financial Resource Strain:    Difficulty of Paying Living Expenses: Not on file  Food Insecurity:    Worried About Charity fundraiser in the Last Year: Not on file   [employer] of Food in the Last Year: Not on file  Transportation Needs:    Lack of Transportation (Medical): Not on file   Lack of Transportation (Non-Medical): Not on file  Physical Activity:    Days of Exercise per Week: Not on file   Minutes of Exercise per Session: Not on file  Stress:    Feeling of Stress : Not on file  Social Connections:    Frequency of Communication with Friends and Family: Not on file   Frequency of Social Gatherings with Friends and Family: Not on file   Attends Religious Services: Not on file   Active Member of Clubs or Organizations: Not on file   Attends Archivist Meetings: Not on file   Marital Status: Not on file  Intimate Partner Violence:    Fear of Current or Ex-Partner: Not on file   Emotionally Abused: Not on file   Physically Abused: Not on file   Sexually Abused: Not on file    Past Surgical History:  Procedure Laterality Date   ABDOMINAL HYSTERECTOMY  1992   DUB; fibroids; endometriosis.  One remaining ovary.      BREAST LUMPECTOMY Right 2015   Papillary DCIS, sentinel node negative. DR/PR positive. PARTIAL RIGHT MASTECTOMY FOR BREAST CANCER--HAD RADIATION - NO CHEMO --DR. CRYSTAL [place] ONCOLOGIST   BREAST SURGERY Right March 2014   Wide excision,APB RT 10 mm papillary DCIS, ER/PR positive. Sentinel node negative. Partial breast radiation.   CATARACT EXTRACTION, BILATERAL  02/13/2016   [facility].   CHOLECYSTECTOMY  1994   COLONOSCOPY  2015   1 benign polyp-every 5 years/ Dr Candace Cruise   ERCP  1995   JOINT REPLACEMENT Right Sept 2014  knee   PERICARDIOCENTESIS N/A 11/13/2017   Procedure: PERICARDIOCENTESIS;  Surgeon: Nelva Bush, MD;  Location: [facility];  Service: Cardiovascular;  Laterality: N/A;   TOTAL KNEE ARTHROPLASTY Right 04/16/2013   Procedure: RIGHT TOTAL KNEE ARTHROPLASTY;  Surgeon: Mauri Pole, MD;  Location: [facility];  Service: Orthopedics;  Laterality: Right;   TOTAL KNEE ARTHROPLASTY Left 09/15/2014   Procedure: LEFT TOTAL KNEE ARTHROPLASTY;  Surgeon: Mauri Pole, MD;  Location: [facility];  Service: Orthopedics;  Laterality: Left;   TUBAL LIGATION  1979    Family History  Problem Relation Age of Onset   Colon cancer Mother    Cancer Mother 35       colon cancer   Melanoma Father    Cancer Father 55       melanoma   Colon cancer Maternal Grandfather    Breast cancer Maternal Grandfather    Melanoma Sister    Melanoma Sister     Allergies  Allergen Reactions   Penicillins Anaphylaxis    anaphylaxis  Has patient had a PCN reaction causing immediate rash, facial/tongue/throat swelling, SOB or lightheadedness with hypotension: Yes Has patient had a PCN reaction causing severe rash involving mucus membranes or skin necrosis: No Has patient had a PCN reaction that required hospitalization: No Has patient had a PCN reaction occurring within the last 10 years: No If all of the above answers are "NO", then may proceed with Cephalosporin use.    Sulfa  Antibiotics Anaphylaxis   Codeine Other (See Comments) and Nausea And Vomiting    Gi problems    Statins Other (See Comments)    Leg pains   Aspirin Other (See Comments)    "burned my stomach intensely" Abdominal pain and burning    Current Outpatient Medications on File Prior to Visit  Medication Sig Dispense Refill   carbidopa-levodopa (SINEMET IR) 25-100 MG tablet Take by mouth. Take 2 tablets at breakfast, 1 tab at lunch, 1 in the afternoon and 1 at night.     diltiazem (CARDIZEM CD) 180 MG 24 hr capsule TAKE ONE TABLET BY MOUTH EVERY MORNING 90 capsule 2   DULoxetine (CYMBALTA) 60 MG capsule TAKE 1 CAPSULE BY MOUTH 2 TIMES A DAY for anxiety and depression. 180 capsule 1   fluticasone (FLONASE) 50 MCG/ACT nasal spray Place 2 sprays into both nostrils daily as needed for rhinitis or allergies. 16 g 5   furosemide (LASIX) 40 MG tablet Take 0.5 tablets (20 mg total) by mouth daily. 45 tablet 3   gabapentin (NEURONTIN) 100 MG capsule TAKE 1 CAPSULE BY MOUTH THREE TIMES A DAY for anxiety. 270 capsule 1   gabapentin (NEURONTIN) 300 MG capsule Take 300 mg by mouth at bedtime.     hydrocortisone 2.5 % cream Apply to affected areas face 1-2 times a day as directed and as needed. 28 g 5   ketoconazole (NIZORAL) 2 % cream Apply to affected areas face 1-2 times a day as directed. 30 g 5   ketoconazole (NIZORAL) 2 % shampoo Apply to scalp QOD, let sit several mins before rinsing. 120 mL 5   metoprolol tartrate (LOPRESSOR) 25 MG tablet TAKE 3 TABLETS BY MOUTH 2 TIMES DAILY. 540 tablet 3   potassium chloride SA (KLOR-CON M20) 20 MEQ tablet Take 1 tablet (20 mEq total) by mouth daily. 90 tablet 3   sotalol (BETAPACE) 120 MG tablet Take 1 tablet (120 mg total) by mouth every 12 (twelve) hours. 180 tablet 3   tiZANidine (  ZANAFLEX) 4 MG tablet TAKE 2 TABLET (4 MG TOTAL) BY MOUTH DAILY FOR MUSCLE SPASMS AT BEDTIME 180 tablet 0   No current facility-administered medications on file prior  to visit.    BP 124/68 (BP Location: Left Wrist, Patient Position: Sitting, Cuff Size: Normal)    Pulse (!) 54    Temp (!) 96.9 F (36.1 C) (Temporal)    Ht 5' 0.5" (1.537 m)    Wt 187 lb 8 oz (85 kg)    SpO2 97%    BMI 36.02 kg/m    Objective:   Physical Exam HENT:     Right Ear: Tympanic membrane and ear canal normal.     Left Ear: Tympanic membrane and ear canal normal.  Eyes:     Pupils: Pupils are equal, round, and reactive to light.  Cardiovascular:     Rate and Rhythm: Normal rate and regular rhythm.  Pulmonary:     Effort: Pulmonary effort is normal.     Breath sounds: Normal breath sounds.  Abdominal:     General: Bowel sounds are normal.     Palpations: Abdomen is soft.     Tenderness: There is no abdominal tenderness.  Musculoskeletal:     Cervical back: Neck supple.     Comments: Ambulates without assistive device.   Skin:    General: Skin is warm and dry.  Neurological:     Mental Status: She is alert and oriented to person, place, and time.     Cranial Nerves: No cranial nerve deficit.     Deep Tendon Reflexes:     Reflex Scores:      Patellar reflexes are 2+ on the right side and 2+ on the left side. Psychiatric:        Mood and Affect: Mood normal.            Assessment & Plan:

## 2020-06-08 NOTE — Assessment & Plan Note (Signed)
Doing well on Cymbalta and gabapentin. Continue same.

## 2020-06-08 NOTE — Assessment & Plan Note (Signed)
Infrequent symptoms and not on daily medication. Discussed triggers.

## 2020-06-08 NOTE — Assessment & Plan Note (Signed)
Chronic, doing well on gabapentin (takes for anxiety) but also likely helping with back pain. Also taking tizanidine nightly. Continue same.   Commended her on weight loss.

## 2020-06-08 NOTE — Assessment & Plan Note (Signed)
Overall feels well managed on Cymbalta 60 mg BID and gabapentin 100 mg BID with 300 mg HS. Continue same.

## 2020-06-08 NOTE — Assessment & Plan Note (Signed)
Mammogram UTD.  Continue annual screenings.

## 2020-06-11 ENCOUNTER — Other Ambulatory Visit: Payer: Self-pay

## 2020-06-11 DIAGNOSIS — F419 Anxiety disorder, unspecified: Secondary | ICD-10-CM

## 2020-06-11 DIAGNOSIS — R69 Illness, unspecified: Secondary | ICD-10-CM | POA: Diagnosis not present

## 2020-06-11 DIAGNOSIS — F324 Major depressive disorder, single episode, in partial remission: Secondary | ICD-10-CM

## 2020-06-11 DIAGNOSIS — F4312 Post-traumatic stress disorder, chronic: Secondary | ICD-10-CM | POA: Diagnosis not present

## 2020-06-11 MED ORDER — DULOXETINE HCL 60 MG PO CPEP: ORAL_CAPSULE | ORAL | 1 refills | Status: DC

## 2020-06-29 ENCOUNTER — Other Ambulatory Visit: Payer: Self-pay

## 2020-06-29 ENCOUNTER — Other Ambulatory Visit: Payer: Self-pay | Admitting: Cardiology

## 2020-06-29 ENCOUNTER — Ambulatory Visit: Payer: Medicare HMO

## 2020-06-29 DIAGNOSIS — E7800 Pure hypercholesterolemia, unspecified: Secondary | ICD-10-CM

## 2020-06-29 DIAGNOSIS — I10 Essential (primary) hypertension: Secondary | ICD-10-CM

## 2020-06-29 NOTE — Patient Instructions (Addendum)
Dear Selena Bush,  Below is a summary of the goals we discussed during our follow up appointment on June 29, 2020. Please contact me anytime with questions or concerns.   Visit Information  Goals Addressed            This Visit's Progress   . Pharmacy Care Plan       Current Barriers:  . Chronic Disease Management support, education, and care coordination needs related to hypertension, hyperlipidemia, diabetes   Pharmacist Clinical Goal(s):  [name] Pharmacist will review medication safety every 6 months . Maintain blood pressure within goal of < 140/90 mmHg . Maintain average blood glucose (A1c) < 7% . Increase exercise slowly with goal of 150 minutes of moderate activity (such as brisk walking) each week  Interventions: . Comprehensive medication review performed. [facility] pharmacy packaging and delivery services  Patient Self Care Activities:  . Self administers medications as prescribed . Continue exercise with goal of 30 minutes, 5 days per week . Incorporate a healthy diet high in vegetables, fruits and whole grains with low-fat dairy products, chicken, fish, legumes, non-tropical vegetable oils and nuts. Limit intake of sweets, sugar-sweetened beverages and red meats  Please see past updates related to this goal by clicking on the "Past Updates" button in the selected goal       The patient verbalized understanding of instructions, educational materials, and care plan provided today and declined offer to receive copy of patient instructions, educational materials, and care plan.   Telephone follow up appointment with pharmacy team member scheduled for: 12/24/20 at 2:30 PM  [name], PharmD Clinical Pharmacist [facility] [place]  Sun refers to food and lifestyle choices that are based on the traditions of countries located on the [place]. This way of eating has been shown to help  prevent certain conditions and improve outcomes for people who have chronic diseases, like kidney disease and heart disease. What are tips for following this plan? Lifestyle  Cook and eat meals together with your family, when possible.  Drink enough fluid to keep your urine clear or pale yellow.  Be physically active every day. This includes: ? Aerobic exercise like running or swimming. ? Leisure activities like gardening, walking, or housework.  Get 7-8 hours of sleep each night.  If recommended by your health care provider, drink red wine in moderation. This means 1 glass a day for nonpregnant women and 2 glasses a day for men. A glass of wine equals 5 oz (150 mL). Reading food labels   Check the serving size of packaged foods. For foods such as rice and pasta, the serving size refers to the amount of cooked product, not dry.  Check the total fat in packaged foods. Avoid foods that have saturated fat or trans fats.  Check the ingredients list for added sugars, such as corn syrup. Shopping  At the grocery store, buy most of your food from the areas near the walls of the store. This includes: ? Fresh fruits and vegetables (produce). ? Grains, beans, nuts, and seeds. Some of these may be available in unpackaged forms or large amounts (in bulk). ? Fresh seafood. ? Poultry and eggs. ? Low-fat dairy products.  Buy whole ingredients instead of prepackaged foods.  Buy fresh fruits and vegetables in-season from local farmers markets.  Buy frozen fruits and vegetables in resealable bags.  If you do not have access to quality fresh seafood, buy precooked frozen  shrimp or canned fish, such as tuna, salmon, or sardines.  Buy small amounts of raw or cooked vegetables, salads, or olives from the deli or salad bar at your store.  Stock your pantry so you always have certain foods on hand, such as olive oil, canned tuna, canned tomatoes, rice, pasta, and beans. Cooking  Cook foods with  extra-virgin olive oil instead of using butter or other vegetable oils.  Have meat as a side dish, and have vegetables or grains as your main dish. This means having meat in small portions or adding small amounts of meat to foods like pasta or stew.  Use beans or vegetables instead of meat in common dishes like chili or lasagna.  Experiment with different cooking methods. Try roasting or broiling vegetables instead of steaming or sauteing them.  Add frozen vegetables to soups, stews, pasta, or rice.  Add nuts or seeds for added healthy fat at each meal. You can add these to yogurt, salads, or vegetable dishes.  Marinate fish or vegetables using olive oil, lemon juice, garlic, and fresh herbs. Meal planning   Plan to eat 1 vegetarian meal one day each week. Try to work up to 2 vegetarian meals, if possible.  Eat seafood 2 or more times a week.  Have healthy snacks readily available, such as: ? Vegetable sticks with hummus. ? [place] yogurt. ? Fruit and nut trail mix.  Eat balanced meals throughout the week. This includes: ? Fruit: 2-3 servings a day ? Vegetables: 4-5 servings a day ? Low-fat dairy: 2 servings a day ? Fish, poultry, or lean meat: 1 serving a day ? Beans and legumes: 2 or more servings a week ? Nuts and seeds: 1-2 servings a day ? Whole grains: 6-8 servings a day ? Extra-virgin olive oil: 3-4 servings a day  Limit red meat and sweets to only a few servings a month What are my food choices?  Mediterranean diet ? Recommended  Grains: Whole-grain pasta. Brown rice. Bulgar wheat. Polenta. Couscous. Whole-wheat bread. [place].  Vegetables: Artichokes. Beets. Broccoli. Cabbage. Carrots. Eggplant. Green beans. Chard. Kale. Spinach. Onions. Leeks. Peas. Squash. Tomatoes. Peppers. Radishes.  Fruits: Apples. Apricots. Avocado. Berries. Bananas. Cherries. Dates. Figs. Grapes. Lemons. Melon. Oranges. Peaches. Plums. Pomegranate.  Meats and other protein foods:  Beans. Almonds. Sunflower seeds. Pine nuts. Peanuts. [place]. Salmon. Scallops. Shrimp. [place]. Tilapia. Clams. Oysters. Eggs.  Dairy: Low-fat milk. Cheese. Greek yogurt.  Beverages: Water. Red wine. Herbal tea.  Fats and oils: Extra virgin olive oil. Avocado oil. Grape seed oil.  Sweets and desserts: [place] yogurt with honey. Baked apples. Poached pears. Trail mix.  Seasoning and other foods: Basil. Cilantro. Coriander. Cumin. Mint. Parsley. Sage. Rosemary. Tarragon. Garlic. Oregano. Thyme. Pepper. Balsalmic vinegar. Tahini. Hummus. Tomato sauce. Olives. Mushrooms. ? Limit these  Grains: Prepackaged pasta or rice dishes. Prepackaged cereal with added sugar.  Vegetables: Deep fried potatoes (french fries).  Fruits: Fruit canned in syrup.  Meats and other protein foods: Beef. Pork. Lamb. Poultry with skin. Hot dogs. Berniece Salines.  Dairy: Ice cream. Sour cream. Whole milk.  Beverages: Juice. Sugar-sweetened soft drinks. Beer. Liquor and spirits.  Fats and oils: Butter. Canola oil. Vegetable oil. Beef fat (tallow). Lard.  Sweets and desserts: Cookies. Cakes. Pies. Candy.  Seasoning and other foods: Mayonnaise. Premade sauces and marinades. The items listed may not be a complete list. Talk with your dietitian about what dietary choices are right for you. Summary  The Mediterranean diet includes both food and lifestyle choices.  Eat a variety of fresh fruits and vegetables, beans, nuts, seeds, and whole grains.  Limit the amount of red meat and sweets that you eat.  Talk with your health care provider about whether it is safe for you to drink red wine in moderation. This means 1 glass a day for nonpregnant women and 2 glasses a day for men. A glass of wine equals 5 oz (150 mL). This information is not intended to replace advice given to you by your health care provider. Make sure you discuss any questions you have with your health care provider. Document Revised: 03/31/2016 Document Reviewed:  03/24/2016 Elsevier Patient Education  [place].

## 2020-06-29 NOTE — Chronic Care Management (AMB) (Signed)
Chronic Care Management Pharmacy  Name: Selena Bush  MRN: [id number] DOB: 02-Jul-1946  Chief Complaint/ HPI  Selena Bush,  74 y.o., female presents for their Follow-Up CCM visit with the clinical pharmacist via telephone.  PCP : Pleas Koch, NP  Specialists:  Chipper Herb, Neurology   Miguel Dibble, Psychotherapy  [name], Cardiolgy  Their chronic conditions include: HTN, HLD, CHF, GERD, OA, AFIB, obesity, OSA, MDD, anxiety  Patient concerns: reports restless legs over the past 2 months, some improvement when she takes her gabapentin and tizanidine 30 minutes before bed --> recommended discussion with neurology at appt next month, if worsens or unimproved call office  New medications - carbidopa/levodopa, increased gabapentin to 300 mg qhs  Office Visits:  06/08/20:[facility] - continue current medications  07/29/19: Carlis Abbott, tremor of left hand - neurology referral, stop gabapentin  Consult Visit:  04/01/20: Neurology - Tremor/Parkinson's - Increase gabapentin to 100 mg three times daily and 300 mg at night.  Continue Sinemet 25/100 2 tabs in the morning, 1 tab at lunch, 1 tab in the afternoon, and 1 tab at night.   08/26/19: Chipper Herb, NP Neurology - referred for tremor, increase gabapentin dosing, consider primidone next, vitamin D and B12 labs low  06/17/19: [facility], cardiology - no med changes  Allergies  Allergen Reactions  . Penicillins Anaphylaxis    anaphylaxis  Has patient had a PCN reaction causing immediate rash, facial/tongue/throat swelling, SOB or lightheadedness with hypotension: Yes Has patient had a PCN reaction causing severe rash involving mucus membranes or skin necrosis: No Has patient had a PCN reaction that required hospitalization: No Has patient had a PCN reaction occurring within the last 10 years: No If all of the above answers are "NO", then may proceed with Cephalosporin use.   . Sulfa Antibiotics Anaphylaxis  .  Codeine Other (See Comments) and Nausea And Vomiting    Gi problems   . Statins Other (See Comments)    Leg pains  . Aspirin Other (See Comments)    "burned my stomach intensely" Abdominal pain and burning    Medications: Outpatient Encounter Medications as of 06/29/2020  Medication Sig  . carbidopa-levodopa (SINEMET IR) 25-100 MG tablet Take by mouth. Take 2 tablets at breakfast, 1 tab at lunch, 1 in the afternoon and 1 at night.  . diltiazem (CARDIZEM CD) 180 MG 24 hr capsule TAKE ONE TABLET BY MOUTH EVERY MORNING  . DULoxetine (CYMBALTA) 60 MG capsule TAKE 1 CAPSULE BY MOUTH 2 TIMES A DAY for anxiety and depression.  . fluticasone (FLONASE) 50 MCG/ACT nasal spray Place 2 sprays into both nostrils daily as needed for rhinitis or allergies.  . furosemide (LASIX) 40 MG tablet Take 0.5 tablets (20 mg total) by mouth daily.  [name] gabapentin (NEURONTIN) 300 MG capsule Take 300 mg by mouth at bedtime.  . hydrocortisone 2.5 % cream Apply to affected areas face 1-2 times a day as directed and as needed.  . metoprolol tartrate (LOPRESSOR) 25 MG tablet TAKE 3 TABLETS BY MOUTH 2 TIMES DAILY.  [name] potassium chloride SA (KLOR-CON) 20 MEQ tablet TAKE ONE TABLET BY MOUTH EVERY MORNING  . sotalol (BETAPACE) 120 MG tablet Take 1 tablet (120 mg total) by mouth every 12 (twelve) hours.  [name] tiZANidine (ZANAFLEX) 4 MG tablet TAKE 2 TABLET (4 MG TOTAL) BY MOUTH DAILY FOR MUSCLE SPASMS AT BEDTIME  . gabapentin (NEURONTIN) 100 MG capsule TAKE 1 CAPSULE BY MOUTH THREE TIMES A DAY for anxiety. (Patient not taking:  Reported on 06/29/2020)  . ketoconazole (NIZORAL) 2 % cream Apply to affected areas face 1-2 times a day as directed.  [name] ketoconazole (NIZORAL) 2 % shampoo Apply to scalp QOD, let sit several mins before rinsing.  . [DISCONTINUED] potassium chloride SA (KLOR-CON M20) 20 MEQ tablet Take 1 tablet (20 mEq total) by mouth daily.   No facility-administered encounter medications on file as of 06/29/2020.     Current Diagnosis/Assessment: Goals    . Pharmacy Care Plan     Current Barriers:  . Chronic Disease Management support, education, and care coordination needs related to hypertension, hyperlipidemia, diabetes   Pharmacist Clinical Goal(s):  [name] Pharmacist will review medication safety every 6 months . Maintain blood pressure within goal of < 140/90 mmHg . Maintain average blood glucose (A1c) < 7% . Increase exercise slowly with goal of 150 minutes of moderate activity (such as brisk walking) each week  Interventions: . Comprehensive medication review performed. [facility] pharmacy packaging and delivery services  Patient Self Care Activities:  . Self administers medications as prescribed . Continue exercise with goal of 30 minutes, 5 days per week . Incorporate a healthy diet high in vegetables, fruits and whole grains with low-fat dairy products, chicken, fish, legumes, non-tropical vegetable oils and nuts. Limit intake of sweets, sugar-sweetened beverages and red meats  Please see past updates related to this goal by clicking on the "Past Updates" button in the selected goal       AFIB  Symptoms: none reported Patient is currently rate/rhythm controlled Does not check heart rate at home  Patient has failed these meds in past: Xarelto - hemorrhagic pericardial effusion  Patient is currently controlled on the following medications:   Sotalol 120 mg - 1 tablet every 12 hours  Diltiazem 180 mg - 1 capsule daily  Metoprolol tartrate 25 mg - 3 tablets twice daily  Update 06/29/20: No problems reported   Plan: Continue current medications  Diabetes   Recent Relevant Labs: Lab Results  Component Value Date/Time   HGBA1C 5.9 06/01/2020 11:11 AM   HGBA1C 5.6 10/30/2019 03:37 PM    Checking BG: none Patient has failed these meds in past: none  Patient is currently controlled on the following medications:   No pharmacotherapy We discussed: diet and  exercise  Update 06/29/20: A1c remains within pre-DM range. Lost 15 pounds this year by eating less overall.   Plan: Continue current medications  Heart Failure   Type: Diastolic Last ejection fraction: 05/2018  65-70% NYHA Class: I (no activity limitation) AHA HF Stage: B (Heart disease present - no symptoms present)  Patient has failed these meds in past: none  Patient is currently controlled on the following medications:   Metoprolol tartrate 25 mg - 3 tablets twice daily  Furosemide 40 mg - one-half tablet daily  Potassium Cl SA 20 mEq - 1 tablet daily  Update 06/29/20: Denies SOB, swelling, activity not limited by HF Exercise - walking 4-5 days a week, walks inside her home for 15 minutes  Plan: Continue current medications ,  Hypertension   Office blood pressures are  BP Readings from Last 3 Encounters:  06/08/20 124/68  02/02/20 (!) 161/90  01/18/20 (!) 189/126   CMP Latest Ref Rng & Units 06/01/2020 10/30/2019 07/15/2019  Glucose 70 - 99 mg/dL 101(H) 118(H) 122(H)  BUN 6 - 23 mg/dL 17 17 16   Creatinine 0.40 - 1.20 mg/dL 0.99 1.22(H) 1.17(H)  Sodium 135 - 145 mEq/L 138 141 142  Potassium 3.5 -  5.1 mEq/L 4.3 3.9 4.2  Chloride 96 - 112 mEq/L 98 103 101  CO2 19 - 32 mEq/L 32 31 25  Calcium 8.4 - 10.5 mg/dL 9.9 9.8 9.6  Total Protein 6.0 - 8.3 g/dL - 6.8 -  Total Bilirubin 0.2 - 1.2 mg/dL - 0.4 -  Alkaline Phos 39 - 117 U/L - 92 -  AST 0 - 37 U/L - 17 -  ALT 0 - 35 U/L - 11 -   BP goal < 130/80 mmHg Assessment:  Comorbidity: OSA: CPAP - wears every night   Patient has tried these meds in the past:  Lisinopril-HCTZ (stopped after metoprolol and sotalol were added)  Patient checks BP at home: infrequently, she is not sure she has a BP monitor   Patient home BP readings are ranging: none reported  Patient is currently controlled on the following medications:  Metoprolol tartrate 25 mg - 2 tablets twice daily  Furosemide 40 mg - one-half tablet  daily  Diltiazem 180 mg - 1 capsule daily  Update 06/29/20: Not checking BP at home, most recent clinic reading within goal   Plan: Continue current medications   Hyperlipidemia    Lipid Panel     Component Value Date/Time   CHOL 217 (H) 06/01/2020 1111   CHOL 221 (H) 10/02/2017 1054   TRIG 161.0 (H) 06/01/2020 1111   HDL 53.30 06/01/2020 1111   HDL 60 10/02/2017 1054   CHOLHDL 4 06/01/2020 1111   VLDL 32.2 06/01/2020 1111   LDLCALC 132 (H) 06/01/2020 1111   LDLCALC 132 (H) 10/02/2017 1054   LABVLDL 29 10/02/2017 1054   Patient has failed these meds in past: statins (self-reported she tried all of them, per chart review pt tried simvastatin and lovastatin) - leg pain, zetia, red yeast rice Patient is currently uncontrolled on the following medications:   OTC fish oil - 2 capsules day  We discussed: Patient recently resumed fish oil; recommended weekly statin dosing, patient is open, but not ready to try, reports by second week on all statins she was miserable.  Update 06/29/20: Pt plans to talk with cardiology about injection for cholesterol at the end of this month  Plan: Continue current medications and control with diet and exercise.   MDD/Anxiety/Insomnia   Patient has failed these meds in past: sertraline (possible SIADH), suvorexant (unknown) Patient is currently controlled on the following medications:   Duloxetine 60 mg - 1 capsule twice daily   Gabapentin 100 mg - 1 capsule TID We discussed:  Sleeping very well, takes gabapentin for anxiety and duloxetine for depression per patient, finds both very beneficial; tried stopping gabapentin per PCP to see if tremor would improve, tremor did not resolve, resumed therapy, has follow up with neurology for essential tremor   Update 06/29/20: continues to work very well    Plan: Continue current medications  Osteoarthritis/DDD  Patient has failed these meds in past: none reported Patient is currently controlled on  the following medications:   Tizanidine 4 mg - 2 tablets daily at bedtime for muscle spasms   Update 06/29/20: continues to work very well    Plan: Continue current medications   Medication Management  OTCs: flonase 50 mcg/act, vitamin D3 1000 IU - 2 caps daily  Pharmacy/Adherence: uses [employer] pill packaging   Social support: lots of family help, church  Affordability: no concerns   CCM Follow Up:  6 months, telephone (12/24/20 at 2:30 PM)  Selena Bush, PharmD Clinical Pharmacist [facility] at  [place] [phone]

## 2020-06-29 NOTE — Telephone Encounter (Signed)
Rx has been sent to the pharmacy electronically. ° °

## 2020-07-01 ENCOUNTER — Telehealth: Payer: Self-pay

## 2020-07-01 DIAGNOSIS — R69 Illness, unspecified: Secondary | ICD-10-CM | POA: Diagnosis not present

## 2020-07-01 DIAGNOSIS — F4312 Post-traumatic stress disorder, chronic: Secondary | ICD-10-CM | POA: Diagnosis not present

## 2020-07-01 NOTE — Progress Notes (Signed)
Chronic Care Management Pharmacy Assistant   Name: Selena Bush  MRN: [id number] DOB: 11-Dec-1945  Reason for Encounter: Medication Review  Patient Questions:  1.  Have you seen any other providers since your last visit? No  2.  Any changes in your medicines or health? No   Selena Bush,  74 y.o. , female presents for their Follow-Up CCM visit with the clinical pharmacist via telephone.  PCP : Selena Koch, NP  Allergies:   Allergies  Allergen Reactions  . Penicillins Anaphylaxis    anaphylaxis  Has patient had a PCN reaction causing immediate rash, facial/tongue/throat swelling, SOB or lightheadedness with hypotension: Yes Has patient had a PCN reaction causing severe rash involving mucus membranes or skin necrosis: No Has patient had a PCN reaction that required hospitalization: No Has patient had a PCN reaction occurring within the last 10 years: No If all of the above answers are "NO", then may proceed with Cephalosporin use.   . Sulfa Antibiotics Anaphylaxis  . Codeine Other (See Comments) and Nausea And Vomiting    Gi problems   . Statins Other (See Comments)    Leg pains  . Aspirin Other (See Comments)    "burned my stomach intensely" Abdominal pain and burning    Medications: Outpatient Encounter Medications as of 07/01/2020  Medication Sig  . carbidopa-levodopa (SINEMET IR) 25-100 MG tablet Take by mouth. Take 2 tablets at breakfast, 1 tab at lunch, 1 in the afternoon and 1 at night.  . diltiazem (CARDIZEM CD) 180 MG 24 hr capsule TAKE ONE TABLET BY MOUTH EVERY MORNING  . DULoxetine (CYMBALTA) 60 MG capsule TAKE 1 CAPSULE BY MOUTH 2 TIMES A DAY for anxiety and depression.  . fluticasone (FLONASE) 50 MCG/ACT nasal spray Place 2 sprays into both nostrils daily as needed for rhinitis or allergies.  . furosemide (LASIX) 40 MG tablet Take 0.5 tablets (20 mg total) by mouth daily.  [name] gabapentin (NEURONTIN) 100 MG capsule TAKE 1 CAPSULE BY MOUTH THREE  TIMES A DAY for anxiety. (Patient not taking: Reported on 06/29/2020)  . gabapentin (NEURONTIN) 300 MG capsule Take 300 mg by mouth at bedtime.  . hydrocortisone 2.5 % cream Apply to affected areas face 1-2 times a day as directed and as needed.  [name] ketoconazole (NIZORAL) 2 % cream Apply to affected areas face 1-2 times a day as directed.  [name] ketoconazole (NIZORAL) 2 % shampoo Apply to scalp QOD, let sit several mins before rinsing.  . metoprolol tartrate (LOPRESSOR) 25 MG tablet TAKE 3 TABLETS BY MOUTH 2 TIMES DAILY.  [name] potassium chloride SA (KLOR-CON) 20 MEQ tablet TAKE ONE TABLET BY MOUTH EVERY MORNING  . sotalol (BETAPACE) 120 MG tablet Take 1 tablet (120 mg total) by mouth every 12 (twelve) hours.  [name] tiZANidine (ZANAFLEX) 4 MG tablet TAKE 2 TABLET (4 MG TOTAL) BY MOUTH DAILY FOR MUSCLE SPASMS AT BEDTIME   No facility-administered encounter medications on file as of 07/01/2020.    Current Diagnosis: Patient Active Problem List   Diagnosis Date Noted  . Parkinson's disease ([place]) 07/29/2019  . Cat bite of right hand 03/25/2019  . DDD (degenerative disc disease), lumbar 10/19/2018  . Preventative health care 10/19/2018  . Prediabetes 10/19/2018  . (HFpEF) heart failure with preserved ejection fraction ([place]) 12/12/2017  . Pericardial effusion with cardiac tamponade 11/13/2017  . Paroxysmal atrial fibrillation ([place]) 11/05/2017  . Anxiety 03/21/2016  . Insomnia 07/17/2015  . MI (mitral incompetence) 12/10/2014  . Major depressive  disorder 12/09/2014  . OSA (obstructive sleep apnea) 12/09/2014  . GERD (gastroesophageal reflux disease) 12/09/2014  . Hyperglycemia 12/09/2014  . Chronic diastolic CHF (congestive heart failure) ([place])   . HLD (hyperlipidemia)   . Osteoarthritis of both knees   . S/P left TKA 08/19/2014  . 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 (BMI) 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 ([place]) 04/17/2013  . History of ductal carcinoma in situ (DCIS) of breast 11/05/2012  .  Essential hypertension, benign 11/05/2012    Goals Addressed   None     Follow-Up:  Coordination of Enhanced Pharmacy Services  .[name] Reviewed chart for medication changes ahead of medication coordination call.  No OVs, Consults, or hospital visits since last care coordination call/Pharmacist visit. (If appropriate, list visit date, provider name)  No medication changes indicated OR if recent visit, treatment plan here.  BP Readings from Last 3 Encounters:  06/08/20 124/68  02/02/20 (!) 161/90  01/18/20 (!) 189/126    Lab Results  Component Value Date   HGBA1C 5.9 06/01/2020     Patient obtains medications through Adherence Packaging  30 Days   Last adherence delivery included: Sotalol 120 mg- 1 tablet twice daily (breakfast andevening meal),Diltiazem 180 mg -1tablet daily (breakfast),Metoprolol tartrate 25 mg -3 tablets twice a day (breakfast and evening meal),Furosemide 20 mg -1tablet daily (breakfast),Potassium SA 20 mEq -1tablet daily (breakfast),Duloxetine 60 mg -1 tablet twice daily (breakfast andevening meal),Tizanidine 4 mg -2tablets daily (bedtime).  Patient declined Gabapentin 300 mg 1 capsule at bedtime and Gabapentin 100 mg 1 capsule three times a daydue to a receiving a supply from [facility] on 04/01/2020.  Patient is due for next adherence delivery on: 07/10/2020. Called patient and reviewed medications and coordinated delivery.  This delivery to include: *carbidopa-levodopa 25-100 MG tablet  - 2 tabs at breakfast, 1 at lunch, 1 afternoon, 1 at night                                  *diltiazem 180 MG 24 hr capsule- 1 tablet daily (breakfast) *Duloxetine 60 MG capsule-1tablet twice a day (breakfast, evening meal) *furosemide 40 MG tablet-1 tablet daily (breakfast) *gabapentin 100 MG capsule-1 capsule three times a day (breakfast, lunch, dinner) *gabapentin 300 MG capsule-1 capsule (bedtime) *metoprolol tartrate 25 MG tablet-3 tablets twice a day  (breakfast and evening meal) *potassium chloride SA 20 MEQ tablet-1 tablet daily (breakfast) *Sotalol 120 MG tablet-1 tablet twice daily (breakfast and evening meal) *fluticasone 50 MCG/ACT nasal spray-place 2 sprays into both nostrils daily (PRN) *Tizanidine 4 MG tablet-2 tablets daily (bedtime)   Patient declined the following medicationshydrocortisone 2.5 % cream-Apply to affected areas face 1-2 times a day as directed and as needed (OTC) ketoconazole 2 % cream-Apply to affected areas face 1-2 times a day as directed (OTC) ketoconazole 2 % shampoo-Apply to scalp QOD, let sit several mins before rinsing (OTC)  Patient needs refills for: -gabapentin 100 MG capsule-1 capsule three times a day (breakfast, lunch, dinner) -gabapentin 300 MG capsule-1 capsule (bedtime) -Tizanidine 4 MG tablet-2 tablets daily (bedtime) .  Confirmed delivery date of 07/11/2020, advised patient that pharmacy will contact them the morning of delivery.   Pt asked about cost of medication. Her prescriptions are running about $90 dollars a month.  Inquiry into cost shows Gabapentin as costing more.  Research on pt formulary shows Gabapentin is a tier 3 medication with a $47 dollar co-pay.  Pt inquired if there  is a medication covered under her insurance that may be comparable.  [name] Pharm D aware

## 2020-07-02 NOTE — Progress Notes (Signed)
HPI: Follow-up diastolic congestive heart failure, paroxysmal atrial fibrillation, pericardial effusion, hypertension and hyperlipidemia. Patient was admitted March 2019 with atypical chest pain and paroxysmal atrial fibrillation. She was treated with sotalol and Xarelto. However she returned with significant dyspnea and follow-up echocardiogram showed large pericardial effusion with tamponade physiology.She had pericardiocentesis on April 1. Cytology was negative. At that time we felt it was likely her initial presentation was pericarditis and she had a hemorrhagic pericardial effusion from initiation of anticoagulation. We felt her atrial fibrillation may improve once pericardial process resolved. Patient was admitted with pneumonia and recurrent atrial fibrilllationto an outside hospital. Follow-up echocardiogram April 2019 showed normal LV function, moderate left ventricular hypertrophy, mild tricuspid regurgitation, small pericardial effusion and small pleural effusion.Most recent echocardiogram October 2019 showed normal LV function, mildly dilated ascending aorta and no pericardial effusion. Since last seenpt denies CP, dyspnea, palpitations or syncope.  Current Outpatient Medications  Medication Sig Dispense Refill   carbidopa-levodopa (SINEMET IR) 25-100 MG tablet Take by mouth. Take 2 tablets at breakfast, 1 tab at lunch, 1 in the afternoon and 1 at night.     diltiazem (CARDIZEM CD) 180 MG 24 hr capsule TAKE ONE TABLET BY MOUTH EVERY MORNING 90 capsule 2   DULoxetine (CYMBALTA) 60 MG capsule TAKE 1 CAPSULE BY MOUTH 2 TIMES A DAY for anxiety and depression. 180 capsule 1   fluticasone (FLONASE) 50 MCG/ACT nasal spray Place 2 sprays into both nostrils daily as needed for rhinitis or allergies. 16 g 5   furosemide (LASIX) 20 MG tablet TAKE ONE TABLET BY MOUTH EVERY MORNING 15 tablet 6   furosemide (LASIX) 40 MG tablet Take 0.5 tablets (20 mg total) by mouth daily. 45 tablet  3   gabapentin (NEURONTIN) 100 MG capsule TAKE 1 CAPSULE BY MOUTH THREE TIMES A DAY for anxiety. 270 capsule 1   gabapentin (NEURONTIN) 300 MG capsule Take 300 mg by mouth at bedtime.     hydrocortisone 2.5 % cream Apply to affected areas face 1-2 times a day as directed and as needed. 28 g 5   ketoconazole (NIZORAL) 2 % cream Apply to affected areas face 1-2 times a day as directed. 30 g 5   ketoconazole (NIZORAL) 2 % shampoo Apply to scalp QOD, let sit several mins before rinsing. 120 mL 5   metoprolol tartrate (LOPRESSOR) 25 MG tablet TAKE 3 TABLETS BY MOUTH 2 TIMES DAILY. 540 tablet 3   potassium chloride SA (KLOR-CON) 20 MEQ tablet TAKE ONE TABLET BY MOUTH EVERY MORNING 90 tablet 2   sotalol (BETAPACE) 120 MG tablet Take 1 tablet (120 mg total) by mouth every 12 (twelve) hours. 180 tablet 3   tiZANidine (ZANAFLEX) 4 MG tablet TAKE 2 TABLET (4 MG TOTAL) BY MOUTH DAILY FOR MUSCLE SPASMS AT BEDTIME 180 tablet 0   No current facility-administered medications for this visit.     Past Medical History:  Diagnosis Date   Anxiety    Atrial fibrillation (HCC)    Chronic diastolic CHF (congestive heart failure) ([place])    a. echo 09/2014: EF 74-25%, diastolic dysfunction, mild LVH, nl RV size & systolic function, mildly dilated LA (4.3 cm), mild MR/TR, mildly elevated PASP 36.7 mm Hg   DDD (degenerative disc disease), cervical    DDD (degenerative disc disease), lumbar    Depression    Diffuse cystic mastopathy 2014   Dyspnea    GERD (gastroesophageal reflux disease)    Gout    Headache  rare   HLD (hyperlipidemia)    a. statin intolerant 2/2 myalgias   Hx of dysplastic nevus 12/25/2018   L medial ankle   Hypercholesterolemia    Hypertension    Malignant neoplasm of upper-outer quadrant of female breast ([place]) 10/2012   Papillary DCIS, sentinel node negative. DR/PR positive. PARTIAL RIGHT MASTECTOMY FOR BREAST CANCER--HAD RADIATION - NO CHEMO --DR. CRYSTAL  [place] ONCOLOGIST   Obesity    OSA on CPAP    Osteoarthritis of both knees    a. s/p right TKA 04/2013 & left TKA 09/2014   Otitis externa    Personal history of radiation therapy 2015   RIGHT breast-mammosite per pt   Sleep difficulties    LUNESTA HAS HELPED   Vaginal cyst     Past Surgical History:  Procedure Laterality Date   ABDOMINAL HYSTERECTOMY  1992   DUB; fibroids; endometriosis.  One remaining ovary.     BREAST LUMPECTOMY Right 2015   Papillary DCIS, sentinel node negative. DR/PR positive. PARTIAL RIGHT MASTECTOMY FOR BREAST CANCER--HAD RADIATION - NO CHEMO --DR. CRYSTAL [place] ONCOLOGIST   BREAST SURGERY Right March 2014   Wide excision,APB RT 10 mm papillary DCIS, ER/PR positive. Sentinel node negative. Partial breast radiation.   CATARACT EXTRACTION, BILATERAL  02/13/2016   [facility].   CHOLECYSTECTOMY  1994   COLONOSCOPY  2015   1 benign polyp-every 5 years/ Dr Candace Cruise   ERCP  1995   JOINT REPLACEMENT Right Sept 2014   knee   PERICARDIOCENTESIS N/A 11/13/2017   Procedure: PERICARDIOCENTESIS;  Surgeon: Nelva Bush, MD;  Location: [facility];  Service: Cardiovascular;  Laterality: N/A;   TOTAL KNEE ARTHROPLASTY Right 04/16/2013   Procedure: RIGHT TOTAL KNEE ARTHROPLASTY;  Surgeon: Mauri Pole, MD;  Location: [facility];  Service: Orthopedics;  Laterality: Right;   TOTAL KNEE ARTHROPLASTY Left 09/15/2014   Procedure: LEFT TOTAL KNEE ARTHROPLASTY;  Surgeon: Mauri Pole, MD;  Location: [facility];  Service: Orthopedics;  Laterality: Left;   TUBAL LIGATION  1979    Social History   Socioeconomic History   Marital status: Widowed    Spouse name: Chrissie Noa   Number of children: 2   Years of education: College   Highest education level: Bachelor's degree (e.g., BA, AB, BS)  Occupational History   Occupation: Retired  Tobacco Use   Smoking status: Never Smoker   Smokeless tobacco: Never Used   Tobacco comment: social smoker as a teen    Scientific laboratory technician Use: Never used  Substance and Sexual Activity   Alcohol use: No   Drug use: No   Sexual activity: Not Currently    Birth control/protection: Post-menopausal, Surgical  Other Topics Concern   Not on file  Social History Narrative   Marital status: widowed since 09/16/2015      Children: 2 children (52, 40); 5 grandchild (4 in [place]; 1 in [place]).      Lives: alone in townhome; 1 dog, 2 cats      Employment: psychiatric Education officer, museum; retired in 2015      Tobacco: teenager only      Alcohol:  None      Drugs: none      Exercise: walking in 2019; more active in 2019.  Walking daily small amounts.        ADLs: drives; independent with ADLs; no assistant devices      Advanced Directives: none; FULL CODE; no prolonged measures.   Does not need them.  Daughter/Heather  Vista Deck is HCPOA.    Social Determinants of Health   Financial Resource Strain:    Difficulty of Paying Living Expenses: Not on file  Food Insecurity:    Worried About Charity fundraiser in the Last Year: Not on file   [employer] of Food in the Last Year: Not on file  Transportation Needs:    Lack of Transportation (Medical): Not on file   Lack of Transportation (Non-Medical): Not on file  Physical Activity:    Days of Exercise per Week: Not on file   Minutes of Exercise per Session: Not on file  Stress:    Feeling of Stress : Not on file  Social Connections:    Frequency of Communication with Friends and Family: Not on file   Frequency of Social Gatherings with Friends and Family: Not on file   Attends Religious Services: Not on file   Active Member of Clubs or Organizations: Not on file   Attends Archivist Meetings: Not on file   Marital Status: Not on file  Intimate Partner Violence:    Fear of Current or Ex-Partner: Not on file   Emotionally Abused: Not on file   Physically Abused: Not on file   Sexually Abused: Not on file    Family History  Problem  Relation Age of Onset   Colon cancer Mother    Cancer Mother 52       colon cancer   Melanoma Father    Cancer Father 20       melanoma   Colon cancer Maternal Grandfather    Breast cancer Maternal Grandfather    Melanoma Sister    Melanoma Sister     ROS: no fevers or chills, productive cough, hemoptysis, dysphasia, odynophagia, melena, hematochezia, dysuria, hematuria, rash, seizure activity, orthopnea, PND, pedal edema, claudication. Remaining systems are negative.  Physical Exam: Well-developed well-nourished in no acute distress.  Skin is warm and dry.  HEENT is normal.  Neck is supple.  Chest is clear to auscultation with normal expansion.  Cardiovascular exam is regular rate and rhythm.  Abdominal exam nontender or distended. No masses palpated. Extremities show trace edema. neuro grossly intact  ECG-sinus rhythm at a rate of 64, left ventricular hypertrophy with repolarization abnormality.  Personally reviewed  A/P  1 paroxysmal atrial fibrillation-patient remains in sinus rhythm.  We previously felt that her episode was secondary to pericarditis/pneumonia.  She was placed on Xarelto at that time resulting in hemorrhagic pericardial effusion and we have therefore not resumed anticoagulation.  We previously discussed discontinuing sotalol with the rationale that her previous bout of atrial fibrillation was secondary to pericarditis.  However she would like to continue.  We will also continue Cardizem and beta-blockade.  2 hypertension-patient's blood pressure is controlled today.  Continue present medications and follow.  3 history of hemorrhagic pericardial effusion-most recent echocardiogram showed no effusion.  4 chronic diastolic congestive heart failure-she is euvolemic.  Continue Lasix at present dose.  5 hyperlipidemia-add Zetia 10 mg daily.  Check lipids and liver in 12 weeks.  She is intolerant to statins due to myalgias.  [name], MD

## 2020-07-07 ENCOUNTER — Other Ambulatory Visit: Payer: Self-pay | Admitting: Cardiology

## 2020-07-13 ENCOUNTER — Other Ambulatory Visit: Payer: Self-pay

## 2020-07-13 ENCOUNTER — Encounter: Payer: Self-pay | Admitting: Cardiology

## 2020-07-13 ENCOUNTER — Ambulatory Visit: Payer: Medicare HMO | Admitting: Cardiology

## 2020-07-13 VITALS — BP 134/74 | HR 64 | Ht 60.0 in | Wt 191.0 lb

## 2020-07-13 DIAGNOSIS — I480 Paroxysmal atrial fibrillation: Secondary | ICD-10-CM

## 2020-07-13 DIAGNOSIS — E7800 Pure hypercholesterolemia, unspecified: Secondary | ICD-10-CM | POA: Diagnosis not present

## 2020-07-13 DIAGNOSIS — I10 Essential (primary) hypertension: Secondary | ICD-10-CM

## 2020-07-13 DIAGNOSIS — I5032 Chronic diastolic (congestive) heart failure: Secondary | ICD-10-CM | POA: Diagnosis not present

## 2020-07-13 MED ORDER — EZETIMIBE 10 MG PO TABS: 10.0000 mg | ORAL_TABLET | Freq: Every day | ORAL | 3 refills | Status: DC

## 2020-07-13 NOTE — Patient Instructions (Signed)
Medication Instructions:   START EZETIMIBE 10 MG ONCE DAILY  *If you need a refill on your cardiac medications before your next appointment, please call your pharmacy*   Lab Work:  Your physician recommends that you return for lab work in: 3 MONTHS-FASTING  If you have labs (blood work) drawn today and your tests are completely normal, you will receive your results only by: MyChart Message (if you have MyChart) OR A paper copy in the mail If you have any lab test that is abnormal or we need to change your treatment, we will call you to review the results.   Follow-Up: At [facility], you and your health needs are our priority.  As part of our continuing mission to provide you with exceptional heart care, we have created designated Provider Care Teams.  These Care Teams include your primary Cardiologist (physician) and Advanced Practice Providers (APPs -  Physician Assistants and Nurse Practitioners) who all work together to provide you with the care you need, when you need it.  We recommend signing up for the patient portal called "MyChart".  Sign up information is provided on this After Visit Summary.  MyChart is used to connect with patients for Virtual Visits (Telemedicine).  Patients are able to view lab/test results, encounter notes, upcoming appointments, etc.  Non-urgent messages can be sent to your provider as well.   To learn more about what you can do with MyChart, go to [email or web address].    Your next appointment:   12 month(s)  The format for your next appointment:   In Person  Provider:   Brian Crenshaw, MD   

## 2020-07-21 DIAGNOSIS — G20 Parkinson's disease: Secondary | ICD-10-CM | POA: Diagnosis not present

## 2020-07-21 DIAGNOSIS — R262 Difficulty in walking, not elsewhere classified: Secondary | ICD-10-CM | POA: Diagnosis not present

## 2020-07-21 DIAGNOSIS — R200 Anesthesia of skin: Secondary | ICD-10-CM | POA: Diagnosis not present

## 2020-07-21 DIAGNOSIS — R251 Tremor, unspecified: Secondary | ICD-10-CM | POA: Diagnosis not present

## 2020-07-21 DIAGNOSIS — R29898 Other symptoms and signs involving the musculoskeletal system: Secondary | ICD-10-CM | POA: Diagnosis not present

## 2020-07-22 DIAGNOSIS — F4312 Post-traumatic stress disorder, chronic: Secondary | ICD-10-CM | POA: Diagnosis not present

## 2020-07-22 DIAGNOSIS — R69 Illness, unspecified: Secondary | ICD-10-CM | POA: Diagnosis not present

## 2020-07-30 ENCOUNTER — Telehealth: Payer: Self-pay

## 2020-07-30 NOTE — Chronic Care Management (AMB) (Signed)
Chronic Care Management Pharmacy Assistant   Name: Selena Bush  MRN: [id number] DOB: 12-06-1945  Reason for Encounter: Medication Review  Patient Questions:  1.  Have you seen any other providers since your last visit? Yes, 07/21/2020- Dr. Gurney Maxin- Neurology  07/13/20 Dr. [name]- Cardiology    2.  Any changes in your medicines or health? Yes 07/21/20 Dr. Melrose Nakayama increased Sinemet 25/100 to 7 tablets a day, and increased gabapentin to 100 mg three times daily and 600 mg (2 tabs of 300 mg) nightly.  07/13/20 Dr. Stanford Breed added Ezetimibe 10 mg   PCP : Pleas Koch, NP  Allergies:   Allergies  Allergen Reactions  . Penicillins Anaphylaxis    anaphylaxis  Has patient had a PCN reaction causing immediate rash, facial/tongue/throat swelling, SOB or lightheadedness with hypotension: Yes Has patient had a PCN reaction causing severe rash involving mucus membranes or skin necrosis: No Has patient had a PCN reaction that required hospitalization: No Has patient had a PCN reaction occurring within the last 10 years: No If all of the above answers are "NO", then may proceed with Cephalosporin use.   . Sulfa Antibiotics Anaphylaxis  . Codeine Other (See Comments) and Nausea And Vomiting    Gi problems   . Statins Other (See Comments)    Leg pains  . Aspirin Other (See Comments)    "burned my stomach intensely" Abdominal pain and burning    Medications: Outpatient Encounter Medications as of 07/30/2020  Medication Sig  . carbidopa-levodopa (SINEMET IR) 25-100 MG tablet Take by mouth. Take 2 tablets at breakfast, 1 tab at lunch, 1 in the afternoon and 1 at night.  . diltiazem (CARDIZEM CD) 180 MG 24 hr capsule TAKE ONE TABLET BY MOUTH EVERY MORNING  . DULoxetine (CYMBALTA) 60 MG capsule TAKE 1 CAPSULE BY MOUTH 2 TIMES A DAY for anxiety and depression.  [name] ezetimibe (ZETIA) 10 MG tablet Take 1 tablet (10 mg total) by mouth daily.  . fluticasone (FLONASE) 50  MCG/ACT nasal spray Place 2 sprays into both nostrils daily as needed for rhinitis or allergies.  . furosemide (LASIX) 20 MG tablet TAKE ONE TABLET BY MOUTH EVERY MORNING  . furosemide (LASIX) 40 MG tablet Take 0.5 tablets (20 mg total) by mouth daily.  [name] gabapentin (NEURONTIN) 100 MG capsule TAKE 1 CAPSULE BY MOUTH THREE TIMES A DAY for anxiety.  . gabapentin (NEURONTIN) 300 MG capsule Take 300 mg by mouth at bedtime.  . hydrocortisone 2.5 % cream Apply to affected areas face 1-2 times a day as directed and as needed.  [name] ketoconazole (NIZORAL) 2 % cream Apply to affected areas face 1-2 times a day as directed.  [name] ketoconazole (NIZORAL) 2 % shampoo Apply to scalp QOD, let sit several mins before rinsing.  . metoprolol tartrate (LOPRESSOR) 25 MG tablet TAKE 3 TABLETS BY MOUTH 2 TIMES DAILY.  [name] potassium chloride SA (KLOR-CON) 20 MEQ tablet TAKE ONE TABLET BY MOUTH EVERY MORNING  . sotalol (BETAPACE) 120 MG tablet Take 1 tablet (120 mg total) by mouth every 12 (twelve) hours.  [name] tiZANidine (ZANAFLEX) 4 MG tablet TAKE 2 TABLET (4 MG TOTAL) BY MOUTH DAILY FOR MUSCLE SPASMS AT BEDTIME   No facility-administered encounter medications on file as of 07/30/2020.    Current Diagnosis: Patient Active Problem List   Diagnosis Date Noted  . Parkinson's disease ([place]) 07/29/2019  . Cat bite of right hand 03/25/2019  . DDD (degenerative disc disease), lumbar 10/19/2018  .  Preventative health care 10/19/2018  . Prediabetes 10/19/2018  . (HFpEF) heart failure with preserved ejection fraction ([place]) 12/12/2017  . Pericardial effusion with cardiac tamponade 11/13/2017  . Paroxysmal atrial fibrillation ([place]) 11/05/2017  . Anxiety 03/21/2016  . Insomnia 07/17/2015  . MI (mitral incompetence) 12/10/2014  . Major depressive disorder 12/09/2014  . OSA (obstructive sleep apnea) 12/09/2014  . GERD (gastroesophageal reflux disease) 12/09/2014  . Hyperglycemia 12/09/2014  . Chronic diastolic CHF (congestive  heart failure) ([place])   . HLD (hyperlipidemia)   . Osteoarthritis of both knees   . S/P left TKA 08/19/2014  . 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 (BMI) 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 ([place]) 04/17/2013  . History of ductal carcinoma in situ (DCIS) of breast 11/05/2012  . Essential hypertension, benign 11/05/2012   Reviewed chart for medication changes ahead of medication coordination call.  OVs include 07/21/2020- Dr. Gurney Maxin- Neurology and 07/13/20 Dr. [name]- Cardiology No consults, or hospital visits since last care coordination call/Pharmacist visit.   Medication changes indicated 07/21/20 Dr. Melrose Nakayama increased Sinemet 25/100 to 7 tablets a day, and increased gabapentin to 100 mg three times daily and 600 mg (2 tabs of 300 mg) nightly. 07/13/20 Dr. Stanford Breed added Ezetimibe 10 mg  BP Readings from Last 3 Encounters:  07/13/20 134/74  06/08/20 124/68  02/02/20 (!) 161/90    Lab Results  Component Value Date   HGBA1C 5.9 06/01/2020    [facility] 07/30/20  Patient obtains medications through Adherence Packaging  30 Days   Last adherence delivery included:  carbidopa-levodopa 25-100 MG tablet  - 2 tabs at breakfast, 1 at lunch, 1 afternoon, 1 at night         diltiazem 180 MG 24 hr capsule-1 tablet daily (breakfast) Duloxetine 60 MG capsule-1tablet twice a day (breakfast, evening meal) furosemide 40 MG tablet-1 tablet daily (breakfast) gabapentin 100 MG capsule-1 capsule three times a day (breakfast, lunch, dinner) gabapentin 300 MG capsule-1 capsule (bedtime) metoprolol tartrate 25 MG tablet-3 tablets twice a day (breakfast and evening meal) potassium chloride SA 20 MEQ tablet-1tablet daily (breakfast) Sotalol 120 MG tablet-1 tablet twice daily (breakfast and evening meal) fluticasone 50 MCG/ACT nasal spray-place 2 sprays into both nostrils daily (PRN) Tizanidine 4 MG tablet-2tablets daily (bedtime)  Patient declined the following medications last month   hydrocortisone 2.5 % cream-Apply to affected areas face 1-2 times a day as directed and as needed (OTC)  ketoconazole 2 % cream-Apply to affected areas face 1-2 times a day as directed (OTC)  ketoconazole 2 % shampoo-Apply to scalp QOD, let sit several mins before rinsing (OTC)     Patient is due for next adherence delivery on: 08/06/20.   Called patient and reviewed medications and coordinated delivery.  This delivery to include: carbidopa-levodopa 25-100 MG tablet  - 2 tabs at breakfast, 1 at lunch, 1 afternoon, 1 at night  diltiazem 180 MG 24 hr capsule- 1 tablet daily (breakfast) Duloxetine 60 MG capsule-1tablet twice a day (breakfast, evening meal) Ezetimibe 10 mg 1 tablet daily (breakfast) furosemide 40 MG tablet-1 tablet daily (breakfast) gabapentin 100 MG capsule-1 capsule three times a day (breakfast, lunch, dinner)  gabapentin 300 MG capsule-1 capsule (bedtime) metoprolol tartrate 25 MG tablet-3 tablets twice a day (breakfast and evening meal) potassium chloride SA 20 MEQ tablet-1 tablet daily (breakfast) Sotalol 120 MG tablet-1 tablet twice daily (breakfast and evening meal) Tizanidine 4 MG tablet-2 tablets daily (bedtime)   Patient will not need a short or acute fill of medications, prior  to adherence delivery.    Patient declined the following medications : Fluticasone Propionate 50 mcg Place 2 sprays into both nostrils daily - due to PRN use  Patient needs refills for  gabapentin 100 MG capsule-1 capsule three times a day (breakfast, lunch, dinner)- [name]. Requested 07/30/20 Tizanidine 4 MG tablet-2 tablets daily (bedtime)- Tizanidine 4 MG tablet-2 tablets daily (bedtime)   Confirmed delivery date of 08/06/20, advised patient that pharmacy will contact them the morning of delivery.  Follow-Up:  Comptroller and Pharmacist Review   [name], CPP notified  Margaretmary Dys, [facility]  Assistant [phone]

## 2020-07-31 ENCOUNTER — Other Ambulatory Visit: Payer: Self-pay

## 2020-07-31 DIAGNOSIS — M62838 Other muscle spasm: Secondary | ICD-10-CM

## 2020-07-31 MED ORDER — TIZANIDINE HCL 4 MG PO TABS: ORAL_TABLET | ORAL | 0 refills | Status: DC

## 2020-07-31 NOTE — Telephone Encounter (Signed)
Refill request for pending medication last OV 06/08/20 last refill 03/06/20. Please advise

## 2020-07-31 NOTE — Telephone Encounter (Signed)
Patient requesting refill on tizanidine 4 mg to [facility].  Thanks,  [name], PharmD Clinical Pharmacist [facility] [phone]

## 2020-07-31 NOTE — Telephone Encounter (Signed)
Refills sent to pharmacy. 

## 2020-08-10 DIAGNOSIS — E785 Hyperlipidemia, unspecified: Secondary | ICD-10-CM | POA: Diagnosis not present

## 2020-08-10 DIAGNOSIS — Z9181 History of falling: Secondary | ICD-10-CM | POA: Diagnosis not present

## 2020-08-10 DIAGNOSIS — G20 Parkinson's disease: Secondary | ICD-10-CM | POA: Diagnosis not present

## 2020-08-10 DIAGNOSIS — I509 Heart failure, unspecified: Secondary | ICD-10-CM | POA: Diagnosis not present

## 2020-08-10 DIAGNOSIS — Z79899 Other long term (current) drug therapy: Secondary | ICD-10-CM | POA: Diagnosis not present

## 2020-08-10 DIAGNOSIS — I110 Hypertensive heart disease with heart failure: Secondary | ICD-10-CM | POA: Diagnosis not present

## 2020-08-10 DIAGNOSIS — Z7951 Long term (current) use of inhaled steroids: Secondary | ICD-10-CM | POA: Diagnosis not present

## 2020-08-11 DIAGNOSIS — Z79899 Other long term (current) drug therapy: Secondary | ICD-10-CM | POA: Diagnosis not present

## 2020-08-11 DIAGNOSIS — I509 Heart failure, unspecified: Secondary | ICD-10-CM | POA: Diagnosis not present

## 2020-08-11 DIAGNOSIS — Z7951 Long term (current) use of inhaled steroids: Secondary | ICD-10-CM | POA: Diagnosis not present

## 2020-08-11 DIAGNOSIS — E785 Hyperlipidemia, unspecified: Secondary | ICD-10-CM | POA: Diagnosis not present

## 2020-08-11 DIAGNOSIS — I110 Hypertensive heart disease with heart failure: Secondary | ICD-10-CM | POA: Diagnosis not present

## 2020-08-11 DIAGNOSIS — G20 Parkinson's disease: Secondary | ICD-10-CM | POA: Diagnosis not present

## 2020-08-11 DIAGNOSIS — Z9181 History of falling: Secondary | ICD-10-CM | POA: Diagnosis not present

## 2020-08-12 NOTE — Telephone Encounter (Signed)
Closing this encounter as the provider does not have security to do so. I have made no changes to this encounter.

## 2020-08-12 NOTE — Telephone Encounter (Signed)
Closing encounter as provider does not have security to close it. I have made no changes to this encounter.   

## 2020-08-14 DIAGNOSIS — Z9181 History of falling: Secondary | ICD-10-CM | POA: Diagnosis not present

## 2020-08-14 DIAGNOSIS — E785 Hyperlipidemia, unspecified: Secondary | ICD-10-CM | POA: Diagnosis not present

## 2020-08-14 DIAGNOSIS — Z79899 Other long term (current) drug therapy: Secondary | ICD-10-CM | POA: Diagnosis not present

## 2020-08-14 DIAGNOSIS — Z7951 Long term (current) use of inhaled steroids: Secondary | ICD-10-CM | POA: Diagnosis not present

## 2020-08-14 DIAGNOSIS — I110 Hypertensive heart disease with heart failure: Secondary | ICD-10-CM | POA: Diagnosis not present

## 2020-08-14 DIAGNOSIS — I509 Heart failure, unspecified: Secondary | ICD-10-CM | POA: Diagnosis not present

## 2020-08-14 DIAGNOSIS — G20 Parkinson's disease: Secondary | ICD-10-CM | POA: Diagnosis not present

## 2020-08-20 DIAGNOSIS — E785 Hyperlipidemia, unspecified: Secondary | ICD-10-CM | POA: Diagnosis not present

## 2020-08-20 DIAGNOSIS — Z79899 Other long term (current) drug therapy: Secondary | ICD-10-CM | POA: Diagnosis not present

## 2020-08-20 DIAGNOSIS — I110 Hypertensive heart disease with heart failure: Secondary | ICD-10-CM | POA: Diagnosis not present

## 2020-08-20 DIAGNOSIS — Z7951 Long term (current) use of inhaled steroids: Secondary | ICD-10-CM | POA: Diagnosis not present

## 2020-08-20 DIAGNOSIS — F4312 Post-traumatic stress disorder, chronic: Secondary | ICD-10-CM | POA: Diagnosis not present

## 2020-08-20 DIAGNOSIS — Z9181 History of falling: Secondary | ICD-10-CM | POA: Diagnosis not present

## 2020-08-20 DIAGNOSIS — R69 Illness, unspecified: Secondary | ICD-10-CM | POA: Diagnosis not present

## 2020-08-20 DIAGNOSIS — I509 Heart failure, unspecified: Secondary | ICD-10-CM | POA: Diagnosis not present

## 2020-08-20 DIAGNOSIS — G20 Parkinson's disease: Secondary | ICD-10-CM | POA: Diagnosis not present

## 2020-08-25 DIAGNOSIS — E785 Hyperlipidemia, unspecified: Secondary | ICD-10-CM | POA: Diagnosis not present

## 2020-08-25 DIAGNOSIS — G20 Parkinson's disease: Secondary | ICD-10-CM | POA: Diagnosis not present

## 2020-08-25 DIAGNOSIS — Z7951 Long term (current) use of inhaled steroids: Secondary | ICD-10-CM | POA: Diagnosis not present

## 2020-08-25 DIAGNOSIS — Z79899 Other long term (current) drug therapy: Secondary | ICD-10-CM | POA: Diagnosis not present

## 2020-08-25 DIAGNOSIS — I110 Hypertensive heart disease with heart failure: Secondary | ICD-10-CM | POA: Diagnosis not present

## 2020-08-25 DIAGNOSIS — Z9181 History of falling: Secondary | ICD-10-CM | POA: Diagnosis not present

## 2020-08-25 DIAGNOSIS — I509 Heart failure, unspecified: Secondary | ICD-10-CM | POA: Diagnosis not present

## 2020-08-27 DIAGNOSIS — I509 Heart failure, unspecified: Secondary | ICD-10-CM | POA: Diagnosis not present

## 2020-08-27 DIAGNOSIS — Z7951 Long term (current) use of inhaled steroids: Secondary | ICD-10-CM | POA: Diagnosis not present

## 2020-08-27 DIAGNOSIS — E785 Hyperlipidemia, unspecified: Secondary | ICD-10-CM | POA: Diagnosis not present

## 2020-08-27 DIAGNOSIS — Z9181 History of falling: Secondary | ICD-10-CM | POA: Diagnosis not present

## 2020-08-27 DIAGNOSIS — Z79899 Other long term (current) drug therapy: Secondary | ICD-10-CM | POA: Diagnosis not present

## 2020-08-27 DIAGNOSIS — I110 Hypertensive heart disease with heart failure: Secondary | ICD-10-CM | POA: Diagnosis not present

## 2020-08-27 DIAGNOSIS — G20 Parkinson's disease: Secondary | ICD-10-CM | POA: Diagnosis not present

## 2020-09-01 ENCOUNTER — Telehealth: Payer: Self-pay

## 2020-09-01 ENCOUNTER — Telehealth: Payer: Self-pay | Admitting: Cardiology

## 2020-09-01 MED ORDER — METOPROLOL TARTRATE 25 MG PO TABS: ORAL_TABLET | ORAL | 3 refills | Status: DC

## 2020-09-01 NOTE — Chronic Care Management (AMB) (Addendum)
Chronic Care Management Pharmacy Assistant   Name: Selena Bush  MRN: [id number] DOB: [birth year]  Reason for Encounter: Medication Adherence and Delivery Coordination  Patient Questions:  1.  Have you seen any other providers since your last visit? No  2.  Any changes in your medicines or health? No  PCP : Pleas Koch, NP  Allergies:   Allergies  Allergen Reactions   Penicillins Anaphylaxis    anaphylaxis  Has patient had a PCN reaction causing immediate rash, facial/tongue/throat swelling, SOB or lightheadedness with hypotension: Yes Has patient had a PCN reaction causing severe rash involving mucus membranes or skin necrosis: No Has patient had a PCN reaction that required hospitalization: No Has patient had a PCN reaction occurring within the last 10 years: No If all of the above answers are "NO", then may proceed with Cephalosporin use.    Sulfa Antibiotics Anaphylaxis   Codeine Other (See Comments) and Nausea And Vomiting    Gi problems    Statins Other (See Comments)    Leg pains   Aspirin Other (See Comments)    "burned my stomach intensely" Abdominal pain and burning    Medications: Outpatient Encounter Medications as of 09/01/2020  Medication Sig   carbidopa-levodopa (SINEMET IR) 25-100 MG tablet Take by mouth. Take 2 tablets at breakfast, 1 tab at lunch, 1 in the afternoon and 1 at night.   diltiazem (CARDIZEM CD) 180 MG 24 hr capsule TAKE ONE TABLET BY MOUTH EVERY MORNING   DULoxetine (CYMBALTA) 60 MG capsule TAKE 1 CAPSULE BY MOUTH 2 TIMES A DAY for anxiety and depression.   ezetimibe (ZETIA) 10 MG tablet Take 1 tablet (10 mg total) by mouth daily.   fluticasone (FLONASE) 50 MCG/ACT nasal spray Place 2 sprays into both nostrils daily as needed for rhinitis or allergies.   furosemide (LASIX) 20 MG tablet TAKE ONE TABLET BY MOUTH EVERY MORNING   furosemide (LASIX) 40 MG tablet Take 0.5 tablets (20 mg total) by mouth daily.   gabapentin (NEURONTIN)  100 MG capsule TAKE 1 CAPSULE BY MOUTH THREE TIMES A DAY for anxiety.   gabapentin (NEURONTIN) 300 MG capsule Take 300 mg by mouth at bedtime.   hydrocortisone 2.5 % cream Apply to affected areas face 1-2 times a day as directed and as needed.   ketoconazole (NIZORAL) 2 % cream Apply to affected areas face 1-2 times a day as directed.   ketoconazole (NIZORAL) 2 % shampoo Apply to scalp QOD, let sit several mins before rinsing.   metoprolol tartrate (LOPRESSOR) 25 MG tablet TAKE 3 TABLETS BY MOUTH 2 TIMES DAILY.   potassium chloride SA (KLOR-CON) 20 MEQ tablet TAKE ONE TABLET BY MOUTH EVERY MORNING   sotalol (BETAPACE) 120 MG tablet Take 1 tablet (120 mg total) by mouth every 12 (twelve) hours.   tiZANidine (ZANAFLEX) 4 MG tablet TAKE 2 TABLET (4 MG TOTAL) BY MOUTH DAILY FOR MUSCLE SPASMS AT BEDTIME   No facility-administered encounter medications on file as of 09/01/2020.    Current Diagnosis: Patient Active Problem List   Diagnosis Date Noted   Parkinson's disease ([place]) 07/29/2019   Cat bite of right hand 03/25/2019   DDD (degenerative disc disease), lumbar 10/19/2018   Preventative health care 10/19/2018   Prediabetes 10/19/2018   (HFpEF) heart failure with preserved ejection fraction ([place]) 12/12/2017   Pericardial effusion with cardiac tamponade 11/13/2017   Paroxysmal atrial fibrillation ([place]) 11/05/2017   Anxiety 03/21/2016   Insomnia 07/17/2015   MI (mitral incompetence)  12/10/2014   Major depressive disorder 12/09/2014   OSA (obstructive sleep apnea) 12/09/2014   GERD (gastroesophageal reflux disease) 12/09/2014   Hyperglycemia 12/09/2014   Chronic diastolic CHF (congestive heart failure) (HCC)    HLD (hyperlipidemia)    Osteoarthritis of both knees    S/P left TKA 08/19/2014   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 (BMI) 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 ([place]) 04/17/2013   History of ductal carcinoma in situ (DCIS) of breast 11/05/2012   Essential hypertension, benign  11/05/2012    Reviewed chart for medication changes ahead of medication coordination call.  No OVs, Consults, or hospital visits since last care coordination call/Pharmacist visit.  No medication changes indicated OR if recent visit, treatment plan here.  BP Readings from Last 3 Encounters:  07/13/20 134/74  06/08/20 124/68  02/02/20 (!) 161/90    Lab Results  Component Value Date   HGBA1C 5.9 06/01/2020     Patient obtains medications through Adherence Packaging  30 Days   Last adherence delivery on 08/06/20 included 30 day supply of: Diltiazem 180 MG 24 hr capsule- 1 tablet daily (breakfast) Duloxetine 60 MG capsule-1tablet twice a day (breakfast, evening meal) Ezetimibe 10 mg 1 tablet daily (breakfast) furosemide 20 MG tablet-1 tablet daily (breakfast) gabapentin 100 MG capsule-1 capsule three times a day (breakfast, evening meal, bedtime)  gabapentin 300 MG capsule-1 capsule (bedtime) metoprolol tartrate 25 MG tablet-3 tablets twice a day (breakfast and evening meal) potassium chloride SA 20 MEQ tablet-1 tablet daily (breakfast) Sotalol 120 MG tablet-1 tablet twice daily (breakfast and evening meal) Tizanidine 4 MG tablet-2 tablets daily (bedtime)  Patient declined the following last month:  Fluticasone Propionate 50 mcg Place 2 sprays into both nostrils daily - due to PRN use carbidopa-levodopa 25-100 MG tablet  - 2 tabs at 6 AM, 1 at 9 AM, 1 at 12 PM, 1 3 PM, 1 6 PM, 1 at 9 PM patient had excess supply due to filling at [facility]   Patient is due for next delivery on: 09/08/20  Called patient and reviewed medications and coordinated delivery.  This delivery to include:  Adherence packaging 30 DS: diltiazem 180 MG 24 hr capsule- 1 tablet daily (breakfast) Duloxetine 60 MG capsule-1tablet twice a day (breakfast, evening meal) Ezetimibe 10 mg 1 tablet daily (breakfast) furosemide 20 MG tablet-1 tablet daily (breakfast) gabapentin 100 MG capsule-1 capsule three times a day  (breakfast, evening meal, bedtime)  gabapentin 300 MG capsule-2 capsules (bedtime) metoprolol tartrate 25 MG tablet-3 tablets twice a day (breakfast and evening meal) potassium chloride SA 20 MEQ tablet-1 tablet daily (breakfast) Sotalol 120 MG tablet-1 tablet twice daily (breakfast and evening meal) Tizanidine 4 MG tablet-2 tablets daily (bedtime) Vials 30 DS carbidopa-levodopa 25-100 MG tablet  - 2 tabs at 6 AM, 1 at 9 AM, 1 at 12 PM, 1 3 PM, 1 6 PM, 1 at 9 PM  Patient will not need a short or acute fill prior to adherence delivery.  Patient declined the following medications: no adherence medications declined Fluticasone Propionate 50 mcg Place 2 sprays into both nostrils daily - due to PRN use  Patient needs refills for Metoprolol 25 mg requested from Dr. Stanford Breed 09/01/20, gabapentin 300 MG capsule- Dr. Gurney Maxin- requested 09/03/20.  Confirmed delivery date of 09/08/20, advised patient that pharmacy will contact them the morning of delivery. Requests afternoon delivery.   States she is not checking her blood pressure but notes that [place] PT has been coming out and checks her  BP. She states she can not recall a specific reading but they are usually low. States that she knows one of her lowest readings was <100/<100.  Follow-Up:  Coordination of Enhanced Pharmacy Services and Pharmacist Review   [name], CPP notified  Margaretmary Dys, [place] Pharmacy Assistant [phone]  I have reviewed the care management and care coordination activities outlined in this encounter and I am certifying that I agree with the content of this note. No further action required.  [name], PharmD Clinical Pharmacist [facility] at [facility] [phone]

## 2020-09-01 NOTE — Telephone Encounter (Signed)
°*  STAT* If patient is at the pharmacy, call can be transferred to refill team.   1. Which medications need to be refilled? (please list name of each medication and dose if known)   metoprolol tartrate (LOPRESSOR) 25 MG tablet     2. Which pharmacy/location (including street and city if local pharmacy) is medication to be sent to?[place] [address]  3. Do they need a 30 day or 90 day supply? 30 day supply

## 2020-09-01 NOTE — Telephone Encounter (Signed)
Refill sent to pt's pharmacy. 

## 2020-09-11 DIAGNOSIS — G20 Parkinson's disease: Secondary | ICD-10-CM | POA: Diagnosis not present

## 2020-09-11 DIAGNOSIS — I509 Heart failure, unspecified: Secondary | ICD-10-CM | POA: Diagnosis not present

## 2020-09-11 DIAGNOSIS — Z9181 History of falling: Secondary | ICD-10-CM | POA: Diagnosis not present

## 2020-09-11 DIAGNOSIS — Z79899 Other long term (current) drug therapy: Secondary | ICD-10-CM | POA: Diagnosis not present

## 2020-09-11 DIAGNOSIS — Z7951 Long term (current) use of inhaled steroids: Secondary | ICD-10-CM | POA: Diagnosis not present

## 2020-09-11 DIAGNOSIS — I110 Hypertensive heart disease with heart failure: Secondary | ICD-10-CM | POA: Diagnosis not present

## 2020-09-11 DIAGNOSIS — E785 Hyperlipidemia, unspecified: Secondary | ICD-10-CM | POA: Diagnosis not present

## 2020-09-16 DIAGNOSIS — I509 Heart failure, unspecified: Secondary | ICD-10-CM | POA: Diagnosis not present

## 2020-09-16 DIAGNOSIS — E785 Hyperlipidemia, unspecified: Secondary | ICD-10-CM | POA: Diagnosis not present

## 2020-09-16 DIAGNOSIS — G20 Parkinson's disease: Secondary | ICD-10-CM | POA: Diagnosis not present

## 2020-09-16 DIAGNOSIS — Z7951 Long term (current) use of inhaled steroids: Secondary | ICD-10-CM | POA: Diagnosis not present

## 2020-09-16 DIAGNOSIS — Z9181 History of falling: Secondary | ICD-10-CM | POA: Diagnosis not present

## 2020-09-16 DIAGNOSIS — I110 Hypertensive heart disease with heart failure: Secondary | ICD-10-CM | POA: Diagnosis not present

## 2020-09-16 DIAGNOSIS — Z79899 Other long term (current) drug therapy: Secondary | ICD-10-CM | POA: Diagnosis not present

## 2020-09-21 DIAGNOSIS — Z7951 Long term (current) use of inhaled steroids: Secondary | ICD-10-CM | POA: Diagnosis not present

## 2020-09-21 DIAGNOSIS — I509 Heart failure, unspecified: Secondary | ICD-10-CM | POA: Diagnosis not present

## 2020-09-21 DIAGNOSIS — I110 Hypertensive heart disease with heart failure: Secondary | ICD-10-CM | POA: Diagnosis not present

## 2020-09-21 DIAGNOSIS — Z79899 Other long term (current) drug therapy: Secondary | ICD-10-CM | POA: Diagnosis not present

## 2020-09-21 DIAGNOSIS — E785 Hyperlipidemia, unspecified: Secondary | ICD-10-CM | POA: Diagnosis not present

## 2020-09-21 DIAGNOSIS — G20 Parkinson's disease: Secondary | ICD-10-CM | POA: Diagnosis not present

## 2020-09-21 DIAGNOSIS — Z9181 History of falling: Secondary | ICD-10-CM | POA: Diagnosis not present

## 2020-09-24 ENCOUNTER — Other Ambulatory Visit: Payer: Self-pay | Admitting: Cardiology

## 2020-10-01 ENCOUNTER — Telehealth: Payer: Self-pay

## 2020-10-01 DIAGNOSIS — F4312 Post-traumatic stress disorder, chronic: Secondary | ICD-10-CM | POA: Diagnosis not present

## 2020-10-01 DIAGNOSIS — R69 Illness, unspecified: Secondary | ICD-10-CM | POA: Diagnosis not present

## 2020-10-01 NOTE — Chronic Care Management (AMB) (Addendum)
Chronic Care Management Pharmacy Assistant   Name: Selena Bush  MRN: [id number] DOB: 1946/04/25  Reason for Encounter: Medication Review- Medication Adherence and Delivery Coordination  Patient Question:  1.  Have you seen any other providers since your last visit? No  PCP : Pleas Koch, NP  Allergies:   Allergies  Allergen Reactions   Penicillins Anaphylaxis    anaphylaxis  Has patient had a PCN reaction causing immediate rash, facial/tongue/throat swelling, SOB or lightheadedness with hypotension: Yes Has patient had a PCN reaction causing severe rash involving mucus membranes or skin necrosis: No Has patient had a PCN reaction that required hospitalization: No Has patient had a PCN reaction occurring within the last 10 years: No If all of the above answers are "NO", then may proceed with Cephalosporin use.    Sulfa Antibiotics Anaphylaxis   Codeine Other (See Comments) and Nausea And Vomiting    Gi problems    Statins Other (See Comments)    Leg pains   Aspirin Other (See Comments)    "burned my stomach intensely" Abdominal pain and burning    Medications: Outpatient Encounter Medications as of 10/01/2020  Medication Sig   carbidopa-levodopa (SINEMET IR) 25-100 MG tablet Take by mouth. Take 2 tablets at breakfast, 1 tab at lunch, 1 in the afternoon and 1 at night.   diltiazem (CARDIZEM CD) 180 MG 24 hr capsule TAKE ONE TABLET BY MOUTH EVERY MORNING   DULoxetine (CYMBALTA) 60 MG capsule TAKE 1 CAPSULE BY MOUTH 2 TIMES A DAY for anxiety and depression.   ezetimibe (ZETIA) 10 MG tablet Take 1 tablet (10 mg total) by mouth daily.   fluticasone (FLONASE) 50 MCG/ACT nasal spray Place 2 sprays into both nostrils daily as needed for rhinitis or allergies.   furosemide (LASIX) 20 MG tablet TAKE ONE TABLET BY MOUTH EVERY MORNING   furosemide (LASIX) 40 MG tablet Take 0.5 tablets (20 mg total) by mouth daily.   gabapentin (NEURONTIN) 100 MG capsule TAKE 1 CAPSULE BY  MOUTH THREE TIMES A DAY for anxiety.   gabapentin (NEURONTIN) 300 MG capsule Take 300 mg by mouth at bedtime.   hydrocortisone 2.5 % cream Apply to affected areas face 1-2 times a day as directed and as needed.   ketoconazole (NIZORAL) 2 % cream Apply to affected areas face 1-2 times a day as directed.   ketoconazole (NIZORAL) 2 % shampoo Apply to scalp QOD, let sit several mins before rinsing.   metoprolol tartrate (LOPRESSOR) 25 MG tablet TAKE 3 TABLETS BY MOUTH 2 TIMES DAILY.   potassium chloride SA (KLOR-CON) 20 MEQ tablet TAKE ONE TABLET BY MOUTH EVERY MORNING   sotalol (BETAPACE) 120 MG tablet TAKE ONE TABLET BY MOUTH EVERY MORNING and TAKE ONE TABLET BY MOUTH EVERY EVENING   tiZANidine (ZANAFLEX) 4 MG tablet TAKE 2 TABLET (4 MG TOTAL) BY MOUTH DAILY FOR MUSCLE SPASMS AT BEDTIME   No facility-administered encounter medications on file as of 10/01/2020.    Current Diagnosis: Patient Active Problem List   Diagnosis Date Noted   Parkinson's disease ([place]) 07/29/2019   Cat bite of right hand 03/25/2019   DDD (degenerative disc disease), lumbar 10/19/2018   Preventative health care 10/19/2018   Prediabetes 10/19/2018   (HFpEF) heart failure with preserved ejection fraction ([place]) 12/12/2017   Pericardial effusion with cardiac tamponade 11/13/2017   Paroxysmal atrial fibrillation ([place]) 11/05/2017   Anxiety 03/21/2016   Insomnia 07/17/2015   MI (mitral incompetence) 12/10/2014   Major depressive disorder 12/09/2014  OSA (obstructive sleep apnea) 12/09/2014   GERD (gastroesophageal reflux disease) 12/09/2014   Hyperglycemia 12/09/2014   Chronic diastolic CHF (congestive heart failure) (HCC)    HLD (hyperlipidemia)    Osteoarthritis of both knees    S/P left TKA 08/19/2014   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 (BMI) 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 ([place]) 04/17/2013   History of ductal carcinoma in situ (DCIS) of breast 11/05/2012   Essential hypertension, benign 11/05/2012     Reviewed chart for medication changes ahead of medication coordination call.  No OVs, Consults, or hospital visits since last care coordination call / Pharmacist visit. No medication changes indicated  BP Readings from Last 3 Encounters:  07/13/20 134/74  06/08/20 124/68  02/02/20 (!) 161/90    Lab Results  Component Value Date   HGBA1C 5.9 06/01/2020     Patient obtains medications through Adherence Packaging  30 Days   Last adherence delivery date: 09/08/20, 30 DS Vials carbidopa-levodopa 25-100 MG tablet  2 tabs at 6 AM, 1 at 9 AM, 1 at 12 PM, 1 3 PM, 1 6 PM, 1 at 9 PM PACKAGING diltiazem 180 MG 24 hr capsule- 1 tablet daily (breakfast) duloxetine 60 MG capsule-1tablet twice a day (breakfast, evening meal) ezetimibe 10 mg 1 tablet daily (breakfast) furosemide 20 MG tablet-1 tablet daily (breakfast) gabapentin 100 MG capsule-1 capsule three times a day (breakfast, evening meal, bedtime)  gabapentin 300 mg - 2 caps at bedtime metoprolol tartrate 25 MG tablet-3 tablets twice a day (breakfast and evening meal) potassium chloride SA 20 MEQ tablet-1 tablet daily (breakfast) sotalol 120 MG tablet-1 tablet twice daily (breakfast and evening meal) tizanidine 4 MG tablet-2 tablets daily (bedtime)  Patient declined the following medications last delivery: No adherence medications denied Fluticasone Propionate 50 mcg Place 2 sprays into both nostrils daily - due to PRN use   Patient is due for next adherence delivery on: 10/08/20  Contacted patient and reviewed medications and coordinated delivery.  This delivery to include: Adherence Packaging  30 Days  diltiazem 180 MG 24 hr capsule- 1 tablet daily (breakfast) duloxetine 60 MG capsule-1tablet twice a day (breakfast, evening meal) ezetimibe 10 mg 1 tablet daily (breakfast) furosemide 20 MG tablet-1 tablet daily (breakfast) gabapentin 100 MG capsule-1 capsule three times a day (breakfast, evening meal, bedtime)  gabapentin  300 mg - 2 caps at bedtime metoprolol tartrate 25 MG tablet-3 tablets twice a day (breakfast and evening meal) potassium chloride SA 20 MEQ tablet-1 tablet daily (breakfast) sotalol 120 MG tablet-1 tablet twice daily (breakfast and evening meal) tizanidine 4 MG tablet-2 tablets daily (bedtime) VIAL medications: carbidopa-levodopa 25-100 MG tablet  2 tabs at 6 AM, 1 at 9 AM, 1 at 12 PM, 1 3 PM, 1 6 PM, 1 at 9 PM fluticasone Propionate 50 mcg Place 2 sprays into both nostrils daily   Patient declined the following medications this month: Ketoconazole Shampoo 2%- PRN use Hydrocortisone 2.5% - PRN use  Patient needs refills on the following medications: carbidopa-levodopa 25-100 MG tablet   gabapentin 100 MG capsule Both medications have been requested from Dr. Gurney Maxin 10/01/20  Confirmed delivery date of 10/08/20, advised patient that pharmacy will contact her the morning of delivery.  Recent blood pressure readings are as follows: Patient does not have any readings available. States she has been doing PT and they check it prior to starting the session and it is "always fairly low". She could not recall a reading.   Follow-Up:  Coordination of  Enhanced Pharmacy Services and Pharmacist Review  [name], CPP notified  Margaretmary Dys, [employer] Assistant [phone]  I have reviewed the care management and care coordination activities outlined in this encounter and I am certifying that I agree with the content of this note. No further action required.  [name], PharmD Clinical Pharmacist [facility] at [facility] [phone]

## 2020-10-06 ENCOUNTER — Other Ambulatory Visit: Payer: Self-pay

## 2020-10-06 ENCOUNTER — Telehealth: Payer: Self-pay

## 2020-10-06 DIAGNOSIS — M62838 Other muscle spasm: Secondary | ICD-10-CM

## 2020-10-06 MED ORDER — FLUTICASONE PROPIONATE 50 MCG/ACT NA SUSP: 2.0000 | Freq: Every day | NASAL | 5 refills | Status: DC | PRN

## 2020-10-06 NOTE — Telephone Encounter (Signed)
Refill sent in

## 2020-10-06 NOTE — Telephone Encounter (Signed)
Patient needs a refill on Flonase to [facility].  Thanks,  [name], PharmD Clinical Pharmacist [facility] at [facility] [phone]

## 2020-10-07 DIAGNOSIS — Z7951 Long term (current) use of inhaled steroids: Secondary | ICD-10-CM | POA: Diagnosis not present

## 2020-10-07 DIAGNOSIS — G20 Parkinson's disease: Secondary | ICD-10-CM | POA: Diagnosis not present

## 2020-10-07 DIAGNOSIS — I110 Hypertensive heart disease with heart failure: Secondary | ICD-10-CM | POA: Diagnosis not present

## 2020-10-07 DIAGNOSIS — I509 Heart failure, unspecified: Secondary | ICD-10-CM | POA: Diagnosis not present

## 2020-10-07 DIAGNOSIS — Z9181 History of falling: Secondary | ICD-10-CM | POA: Diagnosis not present

## 2020-10-07 DIAGNOSIS — E785 Hyperlipidemia, unspecified: Secondary | ICD-10-CM | POA: Diagnosis not present

## 2020-10-07 DIAGNOSIS — Z79899 Other long term (current) drug therapy: Secondary | ICD-10-CM | POA: Diagnosis not present

## 2020-10-08 ENCOUNTER — Other Ambulatory Visit: Payer: Self-pay | Admitting: General Surgery

## 2020-10-08 DIAGNOSIS — D0510 Intraductal carcinoma in situ of unspecified breast: Secondary | ICD-10-CM

## 2020-10-09 ENCOUNTER — Other Ambulatory Visit: Payer: Self-pay

## 2020-10-09 ENCOUNTER — Ambulatory Visit
Admission: EM | Admit: 2020-10-09 | Discharge: 2020-10-09 | Disposition: A | Discharge status: Home / Self Care | Payer: Medicare HMO | Attending: Emergency Medicine | Admitting: Emergency Medicine

## 2020-10-09 DIAGNOSIS — S0181XA Laceration without foreign body of other part of head, initial encounter: Secondary | ICD-10-CM | POA: Diagnosis not present

## 2020-10-09 DIAGNOSIS — W19XXXA Unspecified fall, initial encounter: Secondary | ICD-10-CM

## 2020-10-09 NOTE — ED Provider Notes (Signed)
Selena Bush    CSN: [id number] Arrival date & time: 10/09/20  1505      History   Chief Complaint Chief Complaint  Patient presents with  . Laceration    HPI Selena Bush is a 75 y.o. female.   Selena Bush is a 75 year old with mechanical fall and forehead laceration.  Patient denies any dizziness prior to fall.  Patient denies LOC or dizziness.  Laceration to right forehead/eye brow.  Patient denies any vision changes or periorbital tenderness.  Denies nausea or vomiting.  Alert and oriented.    The history is provided by the patient.  Laceration Location:  Face Facial laceration location:  Forehead and R eyebrow Quality: straight   Bleeding: venous and controlled with pressure   Laceration mechanism:  Fall Foreign body present:  No foreign bodies Relieved by:  Nothing Worsened by:  Nothing Ineffective treatments:  None tried Associated symptoms: no fever, no focal weakness, no numbness, no rash, no redness, no swelling and no streaking     Past Medical History:  Diagnosis Date  . Anxiety   . Atrial fibrillation ([place])   . Chronic diastolic CHF (congestive heart failure) ([place])    a. echo 09/2014: EF 40-98%, diastolic dysfunction, mild LVH, nl RV size & systolic function, mildly dilated LA (4.3 cm), mild MR/TR, mildly elevated PASP 36.7 mm Hg  . DDD (degenerative disc disease), cervical   . DDD (degenerative disc disease), lumbar   . Depression   . Diffuse cystic mastopathy 2014  . Dyspnea   . GERD (gastroesophageal reflux disease)   . Gout   . Headache    rare  . HLD (hyperlipidemia)    a. statin intolerant 2/2 myalgias  . Hx of dysplastic nevus 12/25/2018   L medial ankle  . Hypercholesterolemia   . Hypertension   . Malignant neoplasm of upper-outer quadrant of female breast ([place]) 10/2012   Papillary DCIS, sentinel node negative. DR/PR positive. PARTIAL RIGHT MASTECTOMY FOR BREAST CANCER--HAD RADIATION - NO CHEMO --DR. [place]  ONCOLOGIST  . Obesity   . OSA on CPAP   . Osteoarthritis of both knees    a. s/p right TKA 04/2013 & left TKA 09/2014  . Otitis externa   . Personal history of radiation therapy 2015   RIGHT breast-mammosite per pt  . Sleep difficulties    LUNESTA HAS HELPED  . Vaginal cyst     Patient Active Problem List   Diagnosis Date Noted  . Parkinson's disease ([place]) 07/29/2019  . Cat bite of right hand 03/25/2019  . DDD (degenerative disc disease), lumbar 10/19/2018  . Preventative health care 10/19/2018  . Prediabetes 10/19/2018  . (HFpEF) heart failure with preserved ejection fraction ([place]) 12/12/2017  . Pericardial effusion with cardiac tamponade 11/13/2017  . Paroxysmal atrial fibrillation ([place]) 11/05/2017  . Anxiety 03/21/2016  . Insomnia 07/17/2015  . MI (mitral incompetence) 12/10/2014  . Major depressive disorder 12/09/2014  . OSA (obstructive sleep apnea) 12/09/2014  . GERD (gastroesophageal reflux disease) 12/09/2014  . Hyperglycemia 12/09/2014  . Chronic diastolic CHF (congestive heart failure) ([place])   . HLD (hyperlipidemia)   . Osteoarthritis of both knees   . S/P left TKA 08/19/2014  . 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 (BMI) 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 ([place]) 04/17/2013  . History of ductal carcinoma in situ (DCIS) of breast 11/05/2012  . Essential hypertension, benign 11/05/2012    Past Surgical History:  Procedure Laterality Date  . ABDOMINAL HYSTERECTOMY  1992   DUB; fibroids; endometriosis.  One remaining ovary.    [name] BREAST LUMPECTOMY Right 2015   Papillary DCIS, sentinel node negative. DR/PR positive. PARTIAL RIGHT MASTECTOMY FOR BREAST CANCER--HAD RADIATION - NO CHEMO --DR. [place] ONCOLOGIST  . BREAST SURGERY Right March 2014   Wide excision,APB RT 10 mm papillary DCIS, ER/PR positive. Sentinel node negative. Partial breast radiation.  [name] CATARACT EXTRACTION, BILATERAL  02/13/2016   [place].  . CHOLECYSTECTOMY  1994  . COLONOSCOPY  2015    1 benign polyp-every 5 years/ Dr Candace Cruise  . ERCP  1995  . JOINT REPLACEMENT Right Sept 2014   knee  . PERICARDIOCENTESIS N/A 11/13/2017   Procedure: PERICARDIOCENTESIS;  Surgeon: Nelva Bush, MD;  Location: [facility];  Service: Cardiovascular;  Laterality: N/A;  . TOTAL KNEE ARTHROPLASTY Right 04/16/2013   Procedure: RIGHT TOTAL KNEE ARTHROPLASTY;  Surgeon: Mauri Pole, MD;  Location: [facility];  Service: Orthopedics;  Laterality: Right;  . TOTAL KNEE ARTHROPLASTY Left 09/15/2014   Procedure: LEFT TOTAL KNEE ARTHROPLASTY;  Surgeon: Mauri Pole, MD;  Location: [facility];  Service: Orthopedics;  Laterality: Left;  . TUBAL LIGATION  1979    OB History    Gravida  2   Para  2   Term      Preterm      AB      Living  2     SAB      IAB      Ectopic      Multiple      Live Births           Obstetric Comments  First pregnancy 22 First menstrual 15         Home Medications    Prior to Admission medications   Medication Sig Start Date End Date Taking? Authorizing Provider  carbidopa-levodopa (SINEMET IR) 25-100 MG tablet Take by mouth. Take 2 tablets at breakfast, 1 tab at lunch, 1 in the afternoon and 1 at night. 02/12/20  Yes [provider]  diltiazem (CARDIZEM CD) 180 MG 24 hr capsule TAKE ONE TABLET BY MOUTH EVERY MORNING 02/10/20  Yes Crenshaw, Denice Bors, MD  DULoxetine (CYMBALTA) 60 MG capsule TAKE 1 CAPSULE BY MOUTH 2 TIMES A DAY for anxiety and depression. 06/11/20  Yes Pleas Koch, NP  ezetimibe (ZETIA) 10 MG tablet Take 1 tablet (10 mg total) by mouth daily. 07/13/20 10/11/20 Yes Lelon Perla, MD  fluticasone (FLONASE) 50 MCG/ACT nasal spray Place 2 sprays into both nostrils daily as needed for rhinitis or allergies. 10/06/20  Yes Pleas Koch, NP  furosemide (LASIX) 40 MG tablet Take 0.5 tablets (20 mg total) by mouth daily. 10/08/19  Yes Lelon Perla, MD  gabapentin (NEURONTIN) 300 MG capsule Take 300 mg by mouth at bedtime.    Yes [provider]  metoprolol tartrate (LOPRESSOR) 25 MG tablet TAKE 3 TABLETS BY MOUTH 2 TIMES DAILY. 09/01/20  Yes Crenshaw, Denice Bors, MD  potassium chloride SA (KLOR-CON) 20 MEQ tablet TAKE ONE TABLET BY MOUTH EVERY MORNING 06/29/20  Yes Crenshaw, Denice Bors, MD  sotalol (BETAPACE) 120 MG tablet TAKE ONE TABLET BY MOUTH EVERY MORNING and TAKE ONE TABLET BY MOUTH EVERY EVENING 09/24/20  Yes Lelon Perla, MD  tiZANidine (ZANAFLEX) 4 MG tablet TAKE 2 TABLET (4 MG TOTAL) BY MOUTH DAILY FOR MUSCLE SPASMS AT BEDTIME 07/31/20  Yes Pleas Koch, NP  furosemide (LASIX) 20 MG tablet TAKE ONE TABLET BY  MOUTH EVERY MORNING 07/08/20   Lelon Perla, MD  gabapentin (NEURONTIN) 100 MG capsule TAKE 1 CAPSULE BY MOUTH THREE TIMES A DAY for anxiety. 11/13/19   Pleas Koch, NP  hydrocortisone 2.5 % cream Apply to affected areas face 1-2 times a day as directed and as needed. 01/07/20   Brendolyn Patty, MD  ketoconazole (NIZORAL) 2 % cream Apply to affected areas face 1-2 times a day as directed. 01/07/20   Brendolyn Patty, MD  ketoconazole (NIZORAL) 2 % shampoo Apply to scalp QOD, let sit several mins before rinsing. 01/07/20   Brendolyn Patty, MD    Family History Family History  Problem Relation Age of Onset  . Colon cancer Mother   . Cancer Mother 47       colon cancer  . Melanoma Father   . Cancer Father 61       melanoma  . Colon cancer Maternal Grandfather   . Breast cancer Maternal Grandfather   . Melanoma Sister   . Melanoma Sister     Social History Social History   Tobacco Use  . Smoking status: Never Smoker  . Smokeless tobacco: Never Used  . Tobacco comment: social smoker as a teen  Media planner  . Vaping Use: Never used  Substance Use Topics  . Alcohol use: No  . Drug use: No     Allergies   Penicillins, Sulfa antibiotics, Codeine, Statins, and Aspirin   Review of Systems Review of Systems  Constitutional: Negative for fever.  Skin: Positive for wound.  Negative for rash.  Neurological: Negative for dizziness, tremors, focal weakness, syncope, weakness, light-headedness, numbness and headaches.     Physical Exam Triage Vital Signs ED Triage Vitals [10/09/20 1524]  Enc Vitals Group     BP (!) 153/102     Pulse Rate 64     Resp 20     Temp 98.8 F (37.1 C)     Temp Source Oral     SpO2 94 %     Weight      Height      Head Circumference      Peak Flow      Pain Score      Pain Loc      Pain Edu?      Excl. in [place]?    No data found.  Updated Vital Signs BP (!) 153/102 (BP Location: Right Wrist)   Pulse 64   Temp 98.8 F (37.1 C) (Oral)   Resp 20   SpO2 94%   Visual Acuity Right Eye Distance:   Left Eye Distance:   Bilateral Distance:    Right Eye Near:   Left Eye Near:    Bilateral Near:     Physical Exam Vitals and nursing note reviewed.  Constitutional:      General: She is not in acute distress.    Appearance: She is well-developed and well-nourished.  HENT:     Head: Normocephalic. Laceration present. No raccoon eyes, Battle's sign, abrasion, contusion or masses.   Eyes:     General: Lids are normal.     Extraocular Movements:     Right eye: Normal extraocular motion.     Left eye: Normal extraocular motion.     Conjunctiva/sclera: Conjunctivae normal.     Pupils: Pupils are equal, round, and reactive to light.  Cardiovascular:     Rate and Rhythm: Normal rate.  Pulmonary:     Effort: Pulmonary effort is normal.  Musculoskeletal:  General: No edema.     Cervical back: Neck supple.  Skin:    General: Skin is warm and dry.     Findings: Laceration present.  Neurological:     General: No focal deficit present.     Mental Status: She is alert and oriented to person, place, and time.     GCS: GCS eye subscore is 4. GCS verbal subscore is 5. GCS motor subscore is 6.  Psychiatric:        Mood and Affect: Mood and affect and mood normal.      UC Treatments / Results  Labs (all labs  ordered are listed, but only abnormal results are displayed) Labs Reviewed - No data to display  EKG   Radiology No results found.  Procedures Laceration Repair  Date/Time: 10/09/2020 3:58 PM Performed by: Pearson Forster, NP Authorized by: Pearson Forster, NP   Consent:    Consent obtained:  Verbal   Consent given by:  Patient   Risks, benefits, and alternatives were discussed: yes     Risks discussed:  Infection   Alternatives discussed:  No treatment Anesthesia:    Anesthesia method:  None Laceration details:    Location:  Face   Face location:  R eyebrow   Length (cm):  1   Laceration depth: superficial. Skin repair:    Repair method:  Tissue adhesive Approximation:    Approximation:  Close Repair type:    Repair type:  Simple Post-procedure details:    Dressing:  Open (no dressing)   Procedure completion:  Tolerated   (including critical care time)  Medications Ordered in UC Medications - No data to display  Initial Impression / Assessment and Plan / UC Course  I have reviewed the triage vital signs and the nursing notes.  Pertinent labs & imaging results that were available during my care of the patient were reviewed by me and considered in my medical decision making (see chart for details).     Facial Laceration.  Laceration repair with dermabond.  Cleaned well.   Assessment otherwise negative.   Educated on signs and symptoms of intracranial bleeding and concussion.    Final Clinical Impressions(s) / UC Diagnoses   Final diagnoses:  Facial laceration, initial encounter  Fall, initial encounter     Discharge Instructions     You have a facial laceration.  Do not rub your face over the laceration.   You can wash your face, pat dry.    If you develop any nausea, vomiting, dizziness, loss of consciousness, headache, or vision changes GO TO THE EMERGENCY DEPARTMENT.   Return or go to the Emergency Department if symptoms worsen or do not improve  in the next few days.      ED Prescriptions    None     PDMP not reviewed this encounter.   Pearson Forster, NP 10/09/20 [phone]

## 2020-10-09 NOTE — ED Notes (Signed)
Thoroughly, gently cleansed laceration area to lateral right eye area.

## 2020-10-09 NOTE — ED Triage Notes (Signed)
Pt states she lid off her couch and struck lateral right eye on corner of wooden coffee table approx 30 min PTA.  Pt denies syncope pre-fall, no LOC, neck pain, n/v, HA, change in vision.  Lateral aspect right eye with small laceration noted approx 1 cm.

## 2020-10-09 NOTE — Discharge Instructions (Addendum)
You have a facial laceration.  Do not rub your face over the laceration.   You can wash your face, pat dry.    If you develop any nausea, vomiting, dizziness, loss of consciousness, headache, or vision changes GO TO THE EMERGENCY DEPARTMENT.   Return or go to the Emergency Department if symptoms worsen or do not improve in the next few days.

## 2020-10-12 DIAGNOSIS — M25642 Stiffness of left hand, not elsewhere classified: Secondary | ICD-10-CM | POA: Diagnosis not present

## 2020-10-12 DIAGNOSIS — R251 Tremor, unspecified: Secondary | ICD-10-CM | POA: Diagnosis not present

## 2020-10-12 DIAGNOSIS — R262 Difficulty in walking, not elsewhere classified: Secondary | ICD-10-CM | POA: Diagnosis not present

## 2020-10-12 DIAGNOSIS — G20 Parkinson's disease: Secondary | ICD-10-CM | POA: Diagnosis not present

## 2020-10-12 DIAGNOSIS — W19XXXA Unspecified fall, initial encounter: Secondary | ICD-10-CM | POA: Diagnosis not present

## 2020-10-19 DIAGNOSIS — I110 Hypertensive heart disease with heart failure: Secondary | ICD-10-CM | POA: Diagnosis not present

## 2020-10-19 DIAGNOSIS — Z9181 History of falling: Secondary | ICD-10-CM | POA: Diagnosis not present

## 2020-10-19 DIAGNOSIS — G20 Parkinson's disease: Secondary | ICD-10-CM | POA: Diagnosis not present

## 2020-10-19 DIAGNOSIS — J189 Pneumonia, unspecified organism: Secondary | ICD-10-CM | POA: Diagnosis not present

## 2020-10-19 DIAGNOSIS — E785 Hyperlipidemia, unspecified: Secondary | ICD-10-CM | POA: Diagnosis not present

## 2020-10-19 DIAGNOSIS — I509 Heart failure, unspecified: Secondary | ICD-10-CM | POA: Diagnosis not present

## 2020-10-20 DIAGNOSIS — R69 Illness, unspecified: Secondary | ICD-10-CM | POA: Diagnosis not present

## 2020-10-20 DIAGNOSIS — F4312 Post-traumatic stress disorder, chronic: Secondary | ICD-10-CM | POA: Diagnosis not present

## 2020-10-21 DIAGNOSIS — I110 Hypertensive heart disease with heart failure: Secondary | ICD-10-CM | POA: Diagnosis not present

## 2020-10-21 DIAGNOSIS — E785 Hyperlipidemia, unspecified: Secondary | ICD-10-CM | POA: Diagnosis not present

## 2020-10-21 DIAGNOSIS — G20 Parkinson's disease: Secondary | ICD-10-CM | POA: Diagnosis not present

## 2020-10-21 DIAGNOSIS — I509 Heart failure, unspecified: Secondary | ICD-10-CM | POA: Diagnosis not present

## 2020-10-21 DIAGNOSIS — Z9181 History of falling: Secondary | ICD-10-CM | POA: Diagnosis not present

## 2020-10-25 ENCOUNTER — Other Ambulatory Visit: Payer: Self-pay | Admitting: Cardiology

## 2020-10-27 DIAGNOSIS — I110 Hypertensive heart disease with heart failure: Secondary | ICD-10-CM | POA: Diagnosis not present

## 2020-10-27 DIAGNOSIS — G20 Parkinson's disease: Secondary | ICD-10-CM | POA: Diagnosis not present

## 2020-10-27 DIAGNOSIS — E785 Hyperlipidemia, unspecified: Secondary | ICD-10-CM | POA: Diagnosis not present

## 2020-10-27 DIAGNOSIS — I509 Heart failure, unspecified: Secondary | ICD-10-CM | POA: Diagnosis not present

## 2020-10-27 DIAGNOSIS — Z9181 History of falling: Secondary | ICD-10-CM | POA: Diagnosis not present

## 2020-10-28 DIAGNOSIS — G20 Parkinson's disease: Secondary | ICD-10-CM | POA: Diagnosis not present

## 2020-10-28 DIAGNOSIS — E785 Hyperlipidemia, unspecified: Secondary | ICD-10-CM | POA: Diagnosis not present

## 2020-10-28 DIAGNOSIS — I509 Heart failure, unspecified: Secondary | ICD-10-CM | POA: Diagnosis not present

## 2020-10-28 DIAGNOSIS — Z9181 History of falling: Secondary | ICD-10-CM | POA: Diagnosis not present

## 2020-10-28 DIAGNOSIS — I110 Hypertensive heart disease with heart failure: Secondary | ICD-10-CM | POA: Diagnosis not present

## 2020-10-29 ENCOUNTER — Telehealth: Payer: Self-pay

## 2020-10-29 DIAGNOSIS — E785 Hyperlipidemia, unspecified: Secondary | ICD-10-CM | POA: Diagnosis not present

## 2020-10-29 DIAGNOSIS — Z9181 History of falling: Secondary | ICD-10-CM | POA: Diagnosis not present

## 2020-10-29 DIAGNOSIS — G20 Parkinson's disease: Secondary | ICD-10-CM | POA: Diagnosis not present

## 2020-10-29 DIAGNOSIS — I509 Heart failure, unspecified: Secondary | ICD-10-CM | POA: Diagnosis not present

## 2020-10-29 DIAGNOSIS — I110 Hypertensive heart disease with heart failure: Secondary | ICD-10-CM | POA: Diagnosis not present

## 2020-10-29 MED ORDER — TIZANIDINE HCL 4 MG PO TABS: ORAL_TABLET | ORAL | 0 refills | Status: DC

## 2020-10-29 NOTE — Telephone Encounter (Signed)
Ok to refill 

## 2020-10-29 NOTE — Telephone Encounter (Signed)
-----   Message from [place] sent at 10/29/2020  9:25 AM EDT ----- Regarding: Refill Contact: [phone] Patient needs refill on Tizanidine. She will be going out of town 11/04/20 - 11/08/20 so delivery will be on 11/03/20.   Thanks, [employer]

## 2020-10-29 NOTE — Telephone Encounter (Signed)
Refills sent to pharmacy. 

## 2020-10-29 NOTE — Chronic Care Management (AMB) (Addendum)
Chronic Care Management Pharmacy Assistant   Name: Selena Bush  MRN: [id number] DOB: [birth year]  Reason for Encounter: Medication Adherence and Delivery Coordination  Recent office visits:  10/09/20- Urgent Care- facial laceration  Recent consult visits:  10/12/20- Dr Gurney Maxin- Neurology  [facility] visits:  None in previous 6 months  Medications: Outpatient Encounter Medications as of 10/29/2020  Medication Sig   carbidopa-levodopa (SINEMET IR) 25-100 MG tablet Take by mouth. Take 2 tablets at breakfast, 1 tab at lunch, 1 in the afternoon and 1 at night.   diltiazem (CARDIZEM CD) 180 MG 24 hr capsule TAKE ONE TABLET BY MOUTH EVERY MORNING   DULoxetine (CYMBALTA) 60 MG capsule TAKE 1 CAPSULE BY MOUTH 2 TIMES A DAY for anxiety and depression.   ezetimibe (ZETIA) 10 MG tablet Take 1 tablet (10 mg total) by mouth daily.   fluticasone (FLONASE) 50 MCG/ACT nasal spray Place 2 sprays into both nostrils daily as needed for rhinitis or allergies.   furosemide (LASIX) 20 MG tablet TAKE ONE TABLET BY MOUTH EVERY MORNING   furosemide (LASIX) 40 MG tablet Take 0.5 tablets (20 mg total) by mouth daily.   gabapentin (NEURONTIN) 100 MG capsule TAKE 1 CAPSULE BY MOUTH THREE TIMES A DAY for anxiety.   gabapentin (NEURONTIN) 300 MG capsule Take 300 mg by mouth at bedtime.   hydrocortisone 2.5 % cream Apply to affected areas face 1-2 times a day as directed and as needed.   ketoconazole (NIZORAL) 2 % cream Apply to affected areas face 1-2 times a day as directed.   ketoconazole (NIZORAL) 2 % shampoo Apply to scalp QOD, let sit several mins before rinsing.   metoprolol tartrate (LOPRESSOR) 25 MG tablet TAKE 3 TABLETS BY MOUTH 2 TIMES DAILY.   potassium chloride SA (KLOR-CON) 20 MEQ tablet TAKE ONE TABLET BY MOUTH EVERY MORNING   sotalol (BETAPACE) 120 MG tablet TAKE ONE TABLET BY MOUTH EVERY MORNING and TAKE ONE TABLET BY MOUTH EVERY EVENING   tiZANidine (ZANAFLEX) 4 MG tablet TAKE 2 TABLET (4  MG TOTAL) BY MOUTH DAILY FOR MUSCLE SPASMS AT BEDTIME   No facility-administered encounter medications on file as of 10/29/2020.   Reviewed chart for medication changes ahead of medication coordination call.  No OVs, Consults, or hospital visits since last care coordination call/Pharmacist visit. (If appropriate, list visit date, provider name)  No medication changes indicated OR if recent visit, treatment plan here.  BP Readings from Last 3 Encounters:  10/09/20 (!) 153/102  07/13/20 134/74  06/08/20 124/68    Lab Results  Component Value Date   HGBA1C 5.9 06/01/2020     Patient obtains medications through Adherence Packaging  30 Days   Last adherence delivery included: 10/08/20 [facility] carbidopa-levodopa 25-100 MG tablet  2 tabs at 6 AM, 1 at 9 AM, 1 at 12 PM, 1 3 PM, 1 6 PM, 1 at 9 PM Fluticasone Propionate 50 mcg Place 2 sprays into both nostrils daily  PACKAGING diltiazem 180 MG 24 hr capsule- 1 tablet daily (breakfast) Duloxetine 60 MG capsule-1tablet twice a day (breakfast, evening meal) Ezetimibe 10 mg 1 tablet daily (breakfast) furosemide 20 MG tablet-1 tablet daily (breakfast) gabapentin 100 MG capsule-1 capsule three times a day (breakfast, evening meal, bedtime)  Gabapentin 300 mg - 2 caps at bedtime metoprolol tartrate 25 MG tablet-3 tablets twice a day (breakfast and evening meal) potassium chloride SA 20 MEQ tablet-1 tablet daily (breakfast) Sotalol 120 MG tablet-1 tablet twice daily (breakfast and evening meal) Tizanidine  4 MG tablet-2 tablets daily (bedtime)  Patient declined the following medications last month: No adherence medications denied Ketoconazole Shampoo 2%- PRN use Hydrocortisone 2.5% - PRN use   Patient is due for next adherence delivery on: 11/03/20. Patient notes she will be out of town 11/04/20 - 11/08/20. She will need medications by 11/03/20.  Called patient and reviewed medications and coordinated delivery.  This delivery to  include: PACKAGING diltiazem 180 MG 24 hr capsule- 1 tablet daily (breakfast) Duloxetine 60 MG capsule-1tablet twice a day (breakfast, evening meal) Ezetimibe 10 mg 1 tablet daily (breakfast) furosemide 20 MG tablet-1 tablet daily (breakfast) gabapentin 100 MG capsule-1 capsule three times a day (breakfast, evening meal, bedtime)  Gabapentin 300 mg - 2 caps at bedtime metoprolol tartrate 25 MG tablet-3 tablets twice a day (breakfast and evening meal) potassium chloride SA 20 MEQ tablet-1 tablet daily (breakfast) Sotalol 120 MG tablet-1 tablet twice daily (breakfast and evening meal) Tizanidine 4 MG tablet-2 tablets daily (bedtime) VIALS carbidopa-levodopa 25-100 MG tablet  2 tabs at 6 AM, 1 at 9 AM, 1 at 12 PM, 1 3 PM, 1 6 PM, 1 at 9 PM   Patient declined the following medications: Ketoconazole Shampoo 2%- PRN use Hydrocortisone 2.5% - PRN use Fluticasone Propionate 50 mcg Place 2 sprays into both nostrils daily - PRN use  Patient needs refills for Tizanidine 4 mg- Deno Lunger, NP - requested 10/29/20  Confirmed delivery date of 11/03/20, advised patient that pharmacy will contact them the morning of delivery.  Patient states she does not have a record of her blood pressure. Notes that home health has been coming in and checking blood pressure but she has not been keeping record of it. Asked patient to write down readings when they tell her so we could discuss at next call.  Patient also notes that she received a call from [facility] stating they had filled her carbidopa-levodopa. Contacted [facility] and asked them to back it out and fax to [facility].   Star Rating Drugs: No star rating drugs identified  Follow-Up:  Coordination of Enhanced Pharmacy Services and Pharmacist Review  [name], CPP notified  Margaretmary Dys, [place] [phone]  Total time spent for month: 60  I have reviewed the care management and care coordination activities outlined in  this encounter and I am certifying that I agree with the content of this note. No further action required.  [name], PharmD Clinical Pharmacist [facility] at [facility] [phone]

## 2020-10-30 ENCOUNTER — Telehealth: Payer: Self-pay

## 2020-10-30 NOTE — Telephone Encounter (Signed)
Called and gave information to PT

## 2020-10-30 NOTE — Telephone Encounter (Signed)
Selena Bush OT with [facility] requesting verbal orders for [facility] OT 1 x a wk for 6 wks and then once every other week for 3 wks.

## 2020-10-30 NOTE — Telephone Encounter (Signed)
Approved.  

## 2020-11-02 DIAGNOSIS — G20 Parkinson's disease: Secondary | ICD-10-CM | POA: Diagnosis not present

## 2020-11-02 DIAGNOSIS — I110 Hypertensive heart disease with heart failure: Secondary | ICD-10-CM | POA: Diagnosis not present

## 2020-11-02 DIAGNOSIS — I509 Heart failure, unspecified: Secondary | ICD-10-CM | POA: Diagnosis not present

## 2020-11-02 DIAGNOSIS — Z9181 History of falling: Secondary | ICD-10-CM | POA: Diagnosis not present

## 2020-11-02 DIAGNOSIS — E785 Hyperlipidemia, unspecified: Secondary | ICD-10-CM | POA: Diagnosis not present

## 2020-11-04 DIAGNOSIS — G20 Parkinson's disease: Secondary | ICD-10-CM | POA: Diagnosis not present

## 2020-11-04 DIAGNOSIS — I509 Heart failure, unspecified: Secondary | ICD-10-CM | POA: Diagnosis not present

## 2020-11-04 DIAGNOSIS — E785 Hyperlipidemia, unspecified: Secondary | ICD-10-CM | POA: Diagnosis not present

## 2020-11-04 DIAGNOSIS — Z9181 History of falling: Secondary | ICD-10-CM | POA: Diagnosis not present

## 2020-11-04 DIAGNOSIS — I110 Hypertensive heart disease with heart failure: Secondary | ICD-10-CM | POA: Diagnosis not present

## 2020-11-09 ENCOUNTER — Encounter: Payer: Self-pay | Admitting: *Deleted

## 2020-11-09 DIAGNOSIS — Z9181 History of falling: Secondary | ICD-10-CM | POA: Diagnosis not present

## 2020-11-09 DIAGNOSIS — I110 Hypertensive heart disease with heart failure: Secondary | ICD-10-CM | POA: Diagnosis not present

## 2020-11-09 DIAGNOSIS — I509 Heart failure, unspecified: Secondary | ICD-10-CM | POA: Diagnosis not present

## 2020-11-09 DIAGNOSIS — E785 Hyperlipidemia, unspecified: Secondary | ICD-10-CM | POA: Diagnosis not present

## 2020-11-09 DIAGNOSIS — G20 Parkinson's disease: Secondary | ICD-10-CM | POA: Diagnosis not present

## 2020-11-10 DIAGNOSIS — R69 Illness, unspecified: Secondary | ICD-10-CM | POA: Diagnosis not present

## 2020-11-10 DIAGNOSIS — F4312 Post-traumatic stress disorder, chronic: Secondary | ICD-10-CM | POA: Diagnosis not present

## 2020-11-11 DIAGNOSIS — I110 Hypertensive heart disease with heart failure: Secondary | ICD-10-CM | POA: Diagnosis not present

## 2020-11-11 DIAGNOSIS — E785 Hyperlipidemia, unspecified: Secondary | ICD-10-CM | POA: Diagnosis not present

## 2020-11-11 DIAGNOSIS — G20 Parkinson's disease: Secondary | ICD-10-CM | POA: Diagnosis not present

## 2020-11-11 DIAGNOSIS — Z9181 History of falling: Secondary | ICD-10-CM | POA: Diagnosis not present

## 2020-11-11 DIAGNOSIS — I509 Heart failure, unspecified: Secondary | ICD-10-CM | POA: Diagnosis not present

## 2020-11-12 DIAGNOSIS — Z9181 History of falling: Secondary | ICD-10-CM | POA: Diagnosis not present

## 2020-11-12 DIAGNOSIS — E785 Hyperlipidemia, unspecified: Secondary | ICD-10-CM | POA: Diagnosis not present

## 2020-11-12 DIAGNOSIS — I509 Heart failure, unspecified: Secondary | ICD-10-CM | POA: Diagnosis not present

## 2020-11-12 DIAGNOSIS — G20 Parkinson's disease: Secondary | ICD-10-CM | POA: Diagnosis not present

## 2020-11-12 DIAGNOSIS — I110 Hypertensive heart disease with heart failure: Secondary | ICD-10-CM | POA: Diagnosis not present

## 2020-11-16 DIAGNOSIS — Z9181 History of falling: Secondary | ICD-10-CM | POA: Diagnosis not present

## 2020-11-16 DIAGNOSIS — E785 Hyperlipidemia, unspecified: Secondary | ICD-10-CM | POA: Diagnosis not present

## 2020-11-16 DIAGNOSIS — I110 Hypertensive heart disease with heart failure: Secondary | ICD-10-CM | POA: Diagnosis not present

## 2020-11-16 DIAGNOSIS — G20 Parkinson's disease: Secondary | ICD-10-CM | POA: Diagnosis not present

## 2020-11-16 DIAGNOSIS — I509 Heart failure, unspecified: Secondary | ICD-10-CM | POA: Diagnosis not present

## 2020-11-17 DIAGNOSIS — G20 Parkinson's disease: Secondary | ICD-10-CM | POA: Diagnosis not present

## 2020-11-17 DIAGNOSIS — E785 Hyperlipidemia, unspecified: Secondary | ICD-10-CM | POA: Diagnosis not present

## 2020-11-17 DIAGNOSIS — Z9181 History of falling: Secondary | ICD-10-CM | POA: Diagnosis not present

## 2020-11-17 DIAGNOSIS — I110 Hypertensive heart disease with heart failure: Secondary | ICD-10-CM | POA: Diagnosis not present

## 2020-11-17 DIAGNOSIS — I509 Heart failure, unspecified: Secondary | ICD-10-CM | POA: Diagnosis not present

## 2020-11-19 DIAGNOSIS — I110 Hypertensive heart disease with heart failure: Secondary | ICD-10-CM | POA: Diagnosis not present

## 2020-11-19 DIAGNOSIS — Z9181 History of falling: Secondary | ICD-10-CM | POA: Diagnosis not present

## 2020-11-19 DIAGNOSIS — G20 Parkinson's disease: Secondary | ICD-10-CM | POA: Diagnosis not present

## 2020-11-19 DIAGNOSIS — I509 Heart failure, unspecified: Secondary | ICD-10-CM | POA: Diagnosis not present

## 2020-11-19 DIAGNOSIS — E785 Hyperlipidemia, unspecified: Secondary | ICD-10-CM | POA: Diagnosis not present

## 2020-11-25 DIAGNOSIS — I509 Heart failure, unspecified: Secondary | ICD-10-CM | POA: Diagnosis not present

## 2020-11-25 DIAGNOSIS — I110 Hypertensive heart disease with heart failure: Secondary | ICD-10-CM | POA: Diagnosis not present

## 2020-11-25 DIAGNOSIS — Z9181 History of falling: Secondary | ICD-10-CM | POA: Diagnosis not present

## 2020-11-25 DIAGNOSIS — G20 Parkinson's disease: Secondary | ICD-10-CM | POA: Diagnosis not present

## 2020-11-25 DIAGNOSIS — E785 Hyperlipidemia, unspecified: Secondary | ICD-10-CM | POA: Diagnosis not present

## 2020-11-30 ENCOUNTER — Telehealth: Payer: Self-pay

## 2020-11-30 ENCOUNTER — Other Ambulatory Visit: Payer: Self-pay

## 2020-11-30 ENCOUNTER — Ambulatory Visit
Admission: RE | Admit: 2020-11-30 | Discharge: 2020-11-30 | Disposition: A | Discharge status: Home / Self Care | Payer: Medicare HMO | Source: Ambulatory Visit | Attending: General Surgery | Admitting: General Surgery

## 2020-11-30 DIAGNOSIS — D0510 Intraductal carcinoma in situ of unspecified breast: Secondary | ICD-10-CM

## 2020-11-30 DIAGNOSIS — Z853 Personal history of malignant neoplasm of breast: Secondary | ICD-10-CM | POA: Diagnosis not present

## 2020-11-30 DIAGNOSIS — Z1231 Encounter for screening mammogram for malignant neoplasm of breast: Secondary | ICD-10-CM | POA: Diagnosis not present

## 2020-11-30 NOTE — Chronic Care Management (AMB) (Addendum)
Chronic Care Management Pharmacy Assistant   Name: Selena Bush  MRN: [id number] DOB: [birth year]  Reason for Encounter: Medication Review - Medication adherence and delivery coordination   Recent office visits:  None since last CCM contact  Recent consult visits:  10/12/2020  Newell Coral, Neurology - Continue current medications  10/09/2020  [facility] visits:  None in previous 6 months  Medications: Outpatient Encounter Medications as of 11/30/2020  Medication Sig   carbidopa-levodopa (SINEMET IR) 25-100 MG tablet Take by mouth. Take 2 tablets at breakfast, 1 tab at lunch, 1 in the afternoon and 1 at night.   diltiazem (CARDIZEM CD) 180 MG 24 hr capsule TAKE ONE TABLET BY MOUTH EVERY MORNING   DULoxetine (CYMBALTA) 60 MG capsule TAKE 1 CAPSULE BY MOUTH 2 TIMES A DAY for anxiety and depression.   ezetimibe (ZETIA) 10 MG tablet Take 1 tablet (10 mg total) by mouth daily.   fluticasone (FLONASE) 50 MCG/ACT nasal spray Place 2 sprays into both nostrils daily as needed for rhinitis or allergies.   furosemide (LASIX) 20 MG tablet TAKE ONE TABLET BY MOUTH EVERY MORNING   furosemide (LASIX) 40 MG tablet Take 0.5 tablets (20 mg total) by mouth daily.   gabapentin (NEURONTIN) 100 MG capsule TAKE 1 CAPSULE BY MOUTH THREE TIMES A DAY for anxiety.   gabapentin (NEURONTIN) 300 MG capsule Take 300 mg by mouth at bedtime.   hydrocortisone 2.5 % cream Apply to affected areas face 1-2 times a day as directed and as needed.   ketoconazole (NIZORAL) 2 % cream Apply to affected areas face 1-2 times a day as directed.   ketoconazole (NIZORAL) 2 % shampoo Apply to scalp QOD, let sit several mins before rinsing.   metoprolol tartrate (LOPRESSOR) 25 MG tablet TAKE 3 TABLETS BY MOUTH 2 TIMES DAILY.   potassium chloride SA (KLOR-CON) 20 MEQ tablet TAKE ONE TABLET BY MOUTH EVERY MORNING   sotalol (BETAPACE) 120 MG tablet TAKE ONE TABLET BY MOUTH EVERY MORNING and TAKE  ONE TABLET BY MOUTH EVERY EVENING   tiZANidine (ZANAFLEX) 4 MG tablet TAKE 2 TABLET (4 MG TOTAL) BY MOUTH DAILY FOR MUSCLE SPASMS AT BEDTIME   No facility-administered encounter medications on file as of 11/30/2020.   BP Readings from Last 3 Encounters:  10/09/20 (!) 153/102  07/13/20 134/74  06/08/20 124/68    Lab Results  Component Value Date   HGBA1C 5.9 06/01/2020    No medication changes indicated  Patient obtains medications through Adherence Packaging  30 Days   Last adherence delivery date:  11/03/2020, 30 DS  Patient is due for next adherence delivery on: 12/07/2020  Spoke with patient on 12/02/2020 and reviewed medications and coordinated delivery.  This delivery to include: Adherence Packaging  30 Days  Packs: Diltiazem 180 MG 24 hr capsule- 1 tablet daily (1 breakfast) Duloxetine 60 MG capsule-1 tablet twice a day (1 breakfast, 1 evening meal) Ezetimibe 10 mg 1 tablet daily (1 breakfast) Furosemide 20 MG tablet-1 tablet daily (1 breakfast) Gabapentin 100 MG capsule-1 capsule three times a day (1 breakfast, 1 evening meal, 1 bedtime)  Gabapentin 300 mg - 2 caps at (2 bedtime) Metoprolol tartrate 25 MG tablet-3 tablets twice a day (3 breakfast and 3 evening meal) Potassium chloride SA 20 MEQ tablet-1 tablet daily (1 breakfast) Sotalol 120 MG tablet-1 tablet twice daily (1 breakfast and 1 evening meal) Tizanidine 4 MG tablet-2 tablets daily (2 bedtime)   Vials: Fluticasone  Propionate 50 mcg Place 2 sprays into both nostrils daily  carbidopa-levodopa 25-100 MG tablet  2 tabs at 6 AM, 1 at 9 AM, 1 at 12 PM, 1 3 PM, 1 6 PM, 1 at 9 PM  Patient declined the following medications this month: None  Confirmed delivery date of 12/07/2020 advised patient that pharmacy will contact her the morning of delivery.  Recent blood pressure readings are as follows: per the patient none available  Recent blood glucose readings are as follows: per the patient none available    Follow-Up:  Coordination of Enhanced Pharmacy Services and Pharmacist Review  [name], CPP notified  [name], [place] Assistant [phone]  I have reviewed the care management and care coordination activities outlined in this encounter and I am certifying that I agree with the content of this note. No further action required.  [name], PharmD Clinical Pharmacist [facility] at [facility] [phone]

## 2020-12-01 ENCOUNTER — Encounter: Payer: Self-pay | Admitting: *Deleted

## 2020-12-02 DIAGNOSIS — I110 Hypertensive heart disease with heart failure: Secondary | ICD-10-CM | POA: Diagnosis not present

## 2020-12-02 DIAGNOSIS — Z9181 History of falling: Secondary | ICD-10-CM | POA: Diagnosis not present

## 2020-12-02 DIAGNOSIS — E785 Hyperlipidemia, unspecified: Secondary | ICD-10-CM | POA: Diagnosis not present

## 2020-12-02 DIAGNOSIS — G20 Parkinson's disease: Secondary | ICD-10-CM | POA: Diagnosis not present

## 2020-12-02 DIAGNOSIS — I509 Heart failure, unspecified: Secondary | ICD-10-CM | POA: Diagnosis not present

## 2020-12-03 DIAGNOSIS — I509 Heart failure, unspecified: Secondary | ICD-10-CM | POA: Diagnosis not present

## 2020-12-03 DIAGNOSIS — I110 Hypertensive heart disease with heart failure: Secondary | ICD-10-CM | POA: Diagnosis not present

## 2020-12-03 DIAGNOSIS — E785 Hyperlipidemia, unspecified: Secondary | ICD-10-CM | POA: Diagnosis not present

## 2020-12-03 DIAGNOSIS — Z9181 History of falling: Secondary | ICD-10-CM | POA: Diagnosis not present

## 2020-12-03 DIAGNOSIS — G20 Parkinson's disease: Secondary | ICD-10-CM | POA: Diagnosis not present

## 2020-12-04 ENCOUNTER — Telehealth: Payer: Self-pay

## 2020-12-04 DIAGNOSIS — E785 Hyperlipidemia, unspecified: Secondary | ICD-10-CM | POA: Diagnosis not present

## 2020-12-04 DIAGNOSIS — Z9181 History of falling: Secondary | ICD-10-CM | POA: Diagnosis not present

## 2020-12-04 DIAGNOSIS — I509 Heart failure, unspecified: Secondary | ICD-10-CM | POA: Diagnosis not present

## 2020-12-04 DIAGNOSIS — G20 Parkinson's disease: Secondary | ICD-10-CM | POA: Diagnosis not present

## 2020-12-04 DIAGNOSIS — I110 Hypertensive heart disease with heart failure: Secondary | ICD-10-CM | POA: Diagnosis not present

## 2020-12-04 NOTE — Telephone Encounter (Signed)
Pt calling to get refill on flonase; per med list pt [facility]. Pt will ck with pharmacy about refill. Nothing further needed at this time.

## 2020-12-07 DIAGNOSIS — Z03818 Encounter for observation for suspected exposure to other biological agents ruled out: Secondary | ICD-10-CM | POA: Diagnosis not present

## 2020-12-07 DIAGNOSIS — R0989 Other specified symptoms and signs involving the circulatory and respiratory systems: Secondary | ICD-10-CM | POA: Diagnosis not present

## 2020-12-07 DIAGNOSIS — J189 Pneumonia, unspecified organism: Secondary | ICD-10-CM | POA: Diagnosis not present

## 2020-12-14 ENCOUNTER — Other Ambulatory Visit: Payer: Self-pay

## 2020-12-14 ENCOUNTER — Encounter: Payer: Self-pay | Admitting: Family Medicine

## 2020-12-14 ENCOUNTER — Ambulatory Visit (INDEPENDENT_AMBULATORY_CARE_PROVIDER_SITE_OTHER): Payer: Medicare HMO | Admitting: Family Medicine

## 2020-12-14 VITALS — BP 118/80 | HR 61 | Temp 97.6°F | Ht 60.5 in | Wt 177.0 lb

## 2020-12-14 DIAGNOSIS — I5032 Chronic diastolic (congestive) heart failure: Secondary | ICD-10-CM

## 2020-12-14 DIAGNOSIS — J189 Pneumonia, unspecified organism: Secondary | ICD-10-CM

## 2020-12-14 DIAGNOSIS — E785 Hyperlipidemia, unspecified: Secondary | ICD-10-CM | POA: Diagnosis not present

## 2020-12-14 DIAGNOSIS — I509 Heart failure, unspecified: Secondary | ICD-10-CM | POA: Diagnosis not present

## 2020-12-14 DIAGNOSIS — G20 Parkinson's disease: Secondary | ICD-10-CM | POA: Diagnosis not present

## 2020-12-14 DIAGNOSIS — I110 Hypertensive heart disease with heart failure: Secondary | ICD-10-CM | POA: Diagnosis not present

## 2020-12-14 DIAGNOSIS — Z9181 History of falling: Secondary | ICD-10-CM | POA: Diagnosis not present

## 2020-12-14 NOTE — Assessment & Plan Note (Signed)
On lasix. No extra edema on exam today. Low crackles on lung exam but low suspicion for CHF exacerbation. Cont current lasix dose.

## 2020-12-14 NOTE — Patient Instructions (Addendum)
#   Pneumonia - can take 4-6 weeks to fully recover - Slow improvement is still improvement - continue Tessalon cough pills and albuterol for wheezing or breathing difficulty  If worsening breathing, cough, new fevers, then call immediately  If still coughing beyond June 1 -- schedule a follow-up with Allie Bossier

## 2020-12-14 NOTE — Progress Notes (Signed)
Subjective:     Selena Bush is a 75 y.o. female presenting for Pneumonia (Dx by UC. Tested negative for covid at urgent care. Completed levaquin. Still not feeling well. )     HPI   #Pneumonia - started getting sick ~4/22 - took levo  - using albuterol w/ improvement - continues to have cough and congestion - cough and congestion are improving but not to the extent she would like - worse towards the end of the day - more difficulty breathing at the end of the day and causes some worry about nighttime - cough is productive  No hx of asthma Had double pneumonia 2 years ago - and had breathing difficulty at that time and required hospital admission for this  Feels much better in the morning and cough is worse at night    CHF - taking lasix  - no extra swelling - does not feel this is related to her cold  Review of Systems  Constitutional: Negative for chills and fever.  Respiratory: Positive for cough and shortness of breath.   Musculoskeletal: Negative for myalgias.    12/07/2020: UC - Bacterial pneumonia - tessalon, albuterol, levofloxacin. CXR with atelectasis or inflitrates  Social History   Tobacco Use  Smoking Status Never Smoker  Smokeless Tobacco Never Used  Tobacco Comment   social smoker as a teen        Objective:    BP Readings from Last 3 Encounters:  12/14/20 118/80  10/09/20 (!) 153/102  07/13/20 134/74   Wt Readings from Last 3 Encounters:  12/14/20 177 lb (80.3 kg)  07/13/20 191 lb (86.6 kg)  06/08/20 187 lb 8 oz (85 kg)    BP 118/80   Pulse 61   Temp 97.6 F (36.4 C) (Temporal)   Ht 5' 0.5" (1.537 m)   Wt 177 lb (80.3 kg)   SpO2 100%   BMI 34.00 kg/m    Physical Exam Constitutional:      General: She is not in acute distress.    Appearance: She is well-developed. She is not diaphoretic.     Comments: Ambulating well with walker  HENT:     Right Ear: External ear normal.     Left Ear: External ear normal.  Eyes:      Conjunctiva/sclera: Conjunctivae normal.  Cardiovascular:     Rate and Rhythm: Normal rate and regular rhythm.     Heart sounds: Murmur heard.    Pulmonary:     Effort: Pulmonary effort is normal.     Breath sounds: Rales present. No wheezing or rhonchi.     Comments: Breathing comfortably on RA Musculoskeletal:     Cervical back: Neck supple.     Right lower leg: No edema.     Left lower leg: No edema.  Skin:    General: Skin is warm and dry.     Capillary Refill: Capillary refill takes less than 2 seconds.  Neurological:     Mental Status: She is alert. Mental status is at baseline.  Psychiatric:        Mood and Affect: Mood normal.        Behavior: Behavior normal.      NIO:[id number]  Bibasilar atelectasis and/or infiltrates. Consider radiographic follow-up and/or CT correlation if clinically warranted.     Assessment & Plan:   Problem List Items Addressed This Visit      Cardiovascular and Mediastinum   Chronic diastolic CHF (congestive heart failure) ([place]) - Primary  On lasix. No extra edema on exam today. Low crackles on lung exam but low suspicion for CHF exacerbation. Cont current lasix dose.        Other Visit Diagnoses    Pneumonia of both lungs due to infectious organism, unspecified part of lung       Relevant Medications   albuterol (VENTOLIN HFA) 108 (90 Base) MCG/ACT inhaler   benzonatate (TESSALON) 200 MG capsule   levofloxacin (LEVAQUIN) 750 MG tablet     Pneumonia - discussed long course for recovery but pt with slow improvement - DOE but controlled with slow walking. ER and return precautions discussed including 6 week f/u if symptoms not resolved as may want to consider additional imaging. Cont tessalon and albuterol no need for additional abx or steroids at this time.    >25 minutes was spent reviewing UC note and XR report, discussing with pt and documenting chart.   Return in about 4 weeks (around 01/11/2021), or if symptoms worsen or  fail to improve.  [name], MD  This visit occurred during the SARS-CoV-2 public health emergency.  Safety protocols were in place, including screening questions prior to the visit, additional usage of staff PPE, and extensive cleaning of exam room while observing appropriate contact time as indicated for disinfecting solutions.

## 2020-12-15 ENCOUNTER — Telehealth: Payer: Self-pay

## 2020-12-15 DIAGNOSIS — G20 Parkinson's disease: Secondary | ICD-10-CM | POA: Diagnosis not present

## 2020-12-15 DIAGNOSIS — I110 Hypertensive heart disease with heart failure: Secondary | ICD-10-CM | POA: Diagnosis not present

## 2020-12-15 DIAGNOSIS — E785 Hyperlipidemia, unspecified: Secondary | ICD-10-CM | POA: Diagnosis not present

## 2020-12-15 DIAGNOSIS — Z86010 Personal history of colonic polyps: Secondary | ICD-10-CM | POA: Diagnosis not present

## 2020-12-15 DIAGNOSIS — Z853 Personal history of malignant neoplasm of breast: Secondary | ICD-10-CM | POA: Diagnosis not present

## 2020-12-15 DIAGNOSIS — I509 Heart failure, unspecified: Secondary | ICD-10-CM | POA: Diagnosis not present

## 2020-12-15 DIAGNOSIS — Z9181 History of falling: Secondary | ICD-10-CM | POA: Diagnosis not present

## 2020-12-15 NOTE — Telephone Encounter (Signed)
[facility] PT with [place] request verbal orders for  [facility] PT 1 x a wk for 9 wks.

## 2020-12-15 NOTE — Telephone Encounter (Signed)
Pt seen for follow up by you yesterday ok to give verbal or do you want her to get from [place]?

## 2020-12-15 NOTE — Telephone Encounter (Signed)
OK for [facility] request

## 2020-12-16 ENCOUNTER — Other Ambulatory Visit: Payer: Self-pay | Admitting: General Surgery

## 2020-12-16 NOTE — Telephone Encounter (Signed)
Left message on voice mail with verbal ok.

## 2020-12-16 NOTE — Progress Notes (Signed)
Subjective:     Patient ID: Selena Bush is a 75 y.o. female.  HPI  The following portions of the patient's history were reviewed and updated as appropriate.  This an established patient is here today for: office visit. She is here for her follow up mammogram, post right breast lumpectomy March 2014.  She states she is doing well. She also needs to discuss having a colonoscopy.Bowels move every other day, no bleeding.  Since the patient's last exam she has been diagnosed with Parkinson's.  She has noted some weight loss over the last year, approximately 15-20 pounds.  She reports decreased appetite.  No dietary intolerance.  No pain with meals.  No change in bowel habits.  Energy level remains baseline.  Review of Systems  Constitutional: Negative for chills and fever.  Respiratory: Negative for cough.        Chief Complaint  Patient presents with  . Follow-up     BP (!) 148/87   Pulse 66   Temp 36.7 C (98.1 F)   Ht 157.5 cm (5\' 2" )   Wt 80.3 kg (177 lb)   SpO2 95%   BMI 32.37 kg/m       Past Medical History:  Diagnosis Date  . Anxiety   . Atrial fibrillation (CMS-HCC)   . Cardiac tamponade   . CHF (congestive heart failure) (CMS-HCC)   . DDD (degenerative disc disease), cervical   . DDD (degenerative disc disease), lumbar   . Depression   . Diffuse cystic mastopathy 2014  . Dyspnea   . Family hx of colon cancer    Mother  . GERD (gastroesophageal reflux disease)   . Gout   . History of dysplastic nevus 12/25/2018  . History of radiation therapy 2015  . Hyperlipidemia   . Hypertension   . Malignant neoplasm of right female breast (CMS-HCC) 10/18/2012   Solid papillary carcinoma, noninvasive.  2 mm margins.  ER 90%.  5 years tamoxifen therapy completed 2000.  . Obesity   . Obstructive sleep apnea on CPAP   . Osteoarthritis of both knees   . Sleep difficulties   . Vaginal cyst           Past Surgical History:   Procedure Laterality Date  . ABDOMINAL HYSTERECTOMY  1992  . ARTHROPLASTY TOTAL KNEE Right 04/16/2013  . CATARACT EXTRACTION Bilateral 02/13/2016  . CHOLECYSTECTOMY  1994  . COLONOSCOPY  04/08/2009, 05/20/2003   FHx CC-Mother/Div-[place]/Repeat 64yrs  . COLONOSCOPY  07/24/2014   Hyperplastic polyp/FHx CC-Mother/Repeat 53yrs/PYO  . ERCP  1995  . fluid removal      fluid removal from around heart.   [name] LAPAROSCOPIC TUBAL LIGATION  1979  . MASTECTOMY PARTIAL / LUMPECTOMY Right 10/2012  . PERICARDIOCENTESIS  11/13/2017              OB History    Gravida  2   Para  2   Term      Preterm      AB      Living        SAB      IAB      Ectopic      Molar      Multiple      Live Births          Obstetric Comments  Age at first period 7 Age of first pregnancy 32        Social History         Socioeconomic History  .  Marital status: Single  Tobacco Use  . Smoking status: Never Smoker  . Smokeless tobacco: Never Used  Vaping Use  . Vaping Use: Never used  Substance and Sexual Activity  . Alcohol use: No  . Drug use: No  . Sexual activity: Defer            Allergies  Allergen Reactions  . Penicillins Anaphylaxis    anaphylaxis   . Sulfa (Sulfonamide Antibiotics) Anaphylaxis    anaphylaxis   . Aspirin Abdominal Pain    And burning  . Codeine Other (See Comments)    Gi problems  . Statins-Hmg-Coa Reductase Inhibitors Other (See Comments)    Leg pains    Current Medications        Current Outpatient Medications  Medication Sig Dispense Refill  . acetaminophen (TYLENOL) 500 MG tablet Take by mouth    . carbidopa-levodopa (SINEMET) 25-100 mg tablet 6 am: 2 tabs 9 am: 1 tab 12 pm: 1 tab 3 pm: 1 tab 6 pm: 1 tab 9 pm: 1 tab 210 tablet 1  . diltiazem (CARDIZEM CD) 180 MG CD capsule TAKE 1 CAPSULE BY MOUTH DAILY.    . DULoxetine (CYMBALTA) 60 MG DR capsule TAKE 1 CAPSULE BY MOUTH 2 TIMES A DAY    . ezetimibe  (ZETIA) 10 mg tablet Take 10 mg by mouth once daily    . fluticasone propionate (FLONASE) 50 mcg/actuation nasal spray by Nasal route    . FUROsemide (LASIX) 40 MG tablet Take by mouth 2 (two) times daily.    [name] gabapentin (NEURONTIN) 100 MG capsule Take 1 capsule (100 mg total) by mouth 3 (three) times daily 90 capsule 1  . gabapentin (NEURONTIN) 300 MG capsule TAKE TWO CAPSULES BY MOUTH EVERYDAY AT BEDTIME 60 capsule 1  . metoprolol tartrate (LOPRESSOR) 25 MG tablet TAKE 3 TABLETS BY MOUTH 2 TIMES DAILY. PLEASE SCHEDULE APPT FOR REFILLS    . omeprazole (PRILOSEC) 40 MG DR capsule Take by mouth    . potassium chloride (MICRO-K) 10 MEQ ER capsule Take by mouth 2 (two) times daily.    . sotaloL (BETAPACE) 120 MG tablet TAKE 1 TABLET BY MOUTH EVERY 12 HOURS    . tiZANidine (ZANAFLEX) 4 MG capsule Take 4 mg by mouth 3 (three) times daily.    . bisacodyL (DULCOLAX) 5 mg EC tablet Take two tablets morning and two tablets afternoon day prior to Miralax prep. 4 tablet 0  . cholecalciferol (VITAMIN D3) 1000 unit tablet Take by mouth (Patient not taking: No sig reported)    . cyanocobalamin (VITAMIN B12) 1000 MCG tablet Take 1,000 mcg by mouth once daily (Patient not taking: No sig reported)    . gabapentin (NEURONTIN) 100 MG capsule TAKE ONE CAPSULE BY MOUTH EVERY MORNING and TAKE ONE TABLET BY MOUTH EVERY EVENING and TAKE ONE TABLET BY MOUTH EVERYDAY AT BEDTIME (Patient not taking: Reported on 12/15/2020) 90 capsule 1  . ketoconazole (NIZORAL) 2 % shampoo PLEASE SEE ATTACHED FOR DETAILED DIRECTIONS (Patient not taking: No sig reported)    . metoclopramide (REGLAN) 5 MG tablet Take one tablet 30 minutes prior to colonoscopy prep. May repeat in 4 hours if needed. 2 tablet 0  . omega 3-dha-epa-fish oil (FISH OIL) 100-160-1,000 mg Cap Take by mouth (Patient not taking: No sig reported)    . polyethylene glycol (MIRALAX) powder One bottle for colonoscopy prep. Use as directed. 255 g 0  .  VITAMIN E ACETATE (VITAMIN E ORAL) Take by mouth once daily. (Patient  not taking: No sig reported)     No current facility-administered medications for this visit.           Family History  Problem Relation Age of Onset  . Colon cancer Mother 70  . Melanoma Father   . Melanoma Sister   . Colon cancer Maternal Grandfather        age 58-80  . Rectal cancer Maternal Aunt   . Breast cancer Neg Hx        Objective:   Physical Exam Exam conducted with a chaperone present.  Constitutional:      Appearance: Normal appearance.  Cardiovascular:     Rate and Rhythm: Normal rate and regular rhythm.     Pulses: Normal pulses.     Heart sounds: Normal heart sounds.  Pulmonary:     Effort: Pulmonary effort is normal.     Breath sounds: Normal breath sounds. No stridor or decreased air movement.  Chest:  Breasts:     Right: Normal. No axillary adenopathy or supraclavicular adenopathy.     Left: Normal. No swelling, axillary adenopathy or supraclavicular adenopathy.        Comments: Right lumpectomy Musculoskeletal:     Cervical back: Neck supple.     Right lower leg: No edema.     Left lower leg: No edema.  Lymphadenopathy:     Upper Body:     Right upper body: No supraclavicular or axillary adenopathy.     Left upper body: No supraclavicular or axillary adenopathy.  Skin:    General: Skin is warm and dry.  Neurological:     Mental Status: She is alert and oriented to person, place, and time.  Psychiatric:        Mood and Affect: Mood normal.        Behavior: Behavior normal.      Labs and Radiology:   Pathology from the July 24, 2014 showed a hyperplastic polyp with erosion and inflammation 6 mm in diameter in the sigmoid colon.  Exam completed for a family history of colon cancer (mother).  Images not available for review.   Mammogram review:  Bilateral screening mammograms dated November 30, 2020 were independently reviewed.  Postsurgical  changes on the right.     Assessment:     No evidence of recurrent breast cancer.  Weight loss, possibly related to recent diagnosis of Parkinson's.  Past history of pericardial effusion, no clinical evidence of same today.    Plan:     Indications for colonoscopy reviewed based on her past history.  She reported she had significant nausea with her last exam completed by Caroline More, MD in December 2015.  We will have her make use of Reglan 5 mg 30 minutes before and 4 hours later if needed to minimize nausea.     This note is partially prepared by Karie Fetch, RN, acting as a scribe in the presence of Dr. Hervey Ard, MD.  The documentation recorded by the scribe accurately reflects the service I personally performed and the decisions made by me.   Robert Bellow, MD FACS

## 2020-12-17 ENCOUNTER — Telehealth: Payer: Self-pay

## 2020-12-17 DIAGNOSIS — Z9181 History of falling: Secondary | ICD-10-CM | POA: Diagnosis not present

## 2020-12-17 DIAGNOSIS — E785 Hyperlipidemia, unspecified: Secondary | ICD-10-CM | POA: Diagnosis not present

## 2020-12-17 DIAGNOSIS — I110 Hypertensive heart disease with heart failure: Secondary | ICD-10-CM | POA: Diagnosis not present

## 2020-12-17 DIAGNOSIS — I509 Heart failure, unspecified: Secondary | ICD-10-CM | POA: Diagnosis not present

## 2020-12-17 DIAGNOSIS — G20 Parkinson's disease: Secondary | ICD-10-CM | POA: Diagnosis not present

## 2020-12-17 NOTE — Chronic Care Management (AMB) (Addendum)
    Chronic Care Management Pharmacy Assistant   Name: Selena Bush  MRN: [id number] DOB: August 14, 1946  Reason for Encounter: Reminder for CCM Appointment 12/24/20   Medications: Outpatient Encounter Medications as of 12/17/2020  Medication Sig   albuterol (VENTOLIN HFA) 108 (90 Base) MCG/ACT inhaler Inhale 2 puffs into the lungs every 4 (four) hours as needed.   benzonatate (TESSALON) 200 MG capsule Take 200 mg by mouth 3 (three) times daily as needed.   carbidopa-levodopa (SINEMET IR) 25-100 MG tablet Take by mouth. Take 2 tablets at breakfast, 1 tab at lunch, 1 in the afternoon and 1 at night.   diltiazem (CARDIZEM CD) 180 MG 24 hr capsule TAKE ONE TABLET BY MOUTH EVERY MORNING   DULoxetine (CYMBALTA) 60 MG capsule TAKE 1 CAPSULE BY MOUTH 2 TIMES A DAY for anxiety and depression.   ezetimibe (ZETIA) 10 MG tablet Take 1 tablet (10 mg total) by mouth daily.   fluticasone (FLONASE) 50 MCG/ACT nasal spray Place 2 sprays into both nostrils daily as needed for rhinitis or allergies.   furosemide (LASIX) 20 MG tablet TAKE ONE TABLET BY MOUTH EVERY MORNING   furosemide (LASIX) 40 MG tablet Take 0.5 tablets (20 mg total) by mouth daily.   gabapentin (NEURONTIN) 100 MG capsule TAKE 1 CAPSULE BY MOUTH THREE TIMES A DAY for anxiety.   gabapentin (NEURONTIN) 300 MG capsule Take 300 mg by mouth at bedtime.   hydrocortisone 2.5 % cream Apply to affected areas face 1-2 times a day as directed and as needed.   ketoconazole (NIZORAL) 2 % cream Apply to affected areas face 1-2 times a day as directed.   ketoconazole (NIZORAL) 2 % shampoo Apply to scalp QOD, let sit several mins before rinsing.   levofloxacin (LEVAQUIN) 750 MG tablet Take 750 mg by mouth daily.   metoprolol tartrate (LOPRESSOR) 25 MG tablet TAKE 3 TABLETS BY MOUTH 2 TIMES DAILY.   potassium chloride SA (KLOR-CON) 20 MEQ tablet TAKE ONE TABLET BY MOUTH EVERY MORNING   sotalol (BETAPACE) 120 MG tablet TAKE ONE TABLET BY MOUTH EVERY MORNING and  TAKE ONE TABLET BY MOUTH EVERY EVENING   tiZANidine (ZANAFLEX) 4 MG tablet TAKE 2 TABLET (4 MG TOTAL) BY MOUTH DAILY FOR MUSCLE SPASMS AT BEDTIME   No facility-administered encounter medications on file as of 12/17/2020.   JUDAH CHEVERE was contacted to remind her of her upcoming telephone visit with [name] on 12/24/20 at 2:30 .  she was reminded to have all medications, supplements and any blood glucose and blood pressure readings available for review at appointment.   Are you having any problems with your medications? States she is not aware of any.   What concerns would you like to discuss with the pharmacist? States she may, she will have to think about it.     Star Rating Drugs: Medication:  Last Fill: Day Supply No star rating drugs identified   [name], CPP notified  Margaretmary Dys, [place] [phone]   I have reviewed the care management and care coordination activities outlined in this encounter and I am certifying that I agree with the content of this note. No further action required.  [name], PharmD Clinical Pharmacist [facility] at [facility] [phone]

## 2020-12-17 NOTE — Telephone Encounter (Signed)
Ok with [place] as requested

## 2020-12-17 NOTE — Telephone Encounter (Signed)
Marlowe Kays, OT with [facility], calling for verbal orders for Home OT 1 x week for 5 weeks, 1 x every other week. Leave orders at [phone].

## 2020-12-17 NOTE — Telephone Encounter (Signed)
Called l/m on number provided for verbal orders

## 2020-12-21 DIAGNOSIS — G20 Parkinson's disease: Secondary | ICD-10-CM | POA: Diagnosis not present

## 2020-12-21 DIAGNOSIS — J189 Pneumonia, unspecified organism: Secondary | ICD-10-CM | POA: Diagnosis not present

## 2020-12-21 DIAGNOSIS — E785 Hyperlipidemia, unspecified: Secondary | ICD-10-CM | POA: Diagnosis not present

## 2020-12-21 DIAGNOSIS — I509 Heart failure, unspecified: Secondary | ICD-10-CM | POA: Diagnosis not present

## 2020-12-21 DIAGNOSIS — I110 Hypertensive heart disease with heart failure: Secondary | ICD-10-CM | POA: Diagnosis not present

## 2020-12-21 DIAGNOSIS — Z9181 History of falling: Secondary | ICD-10-CM | POA: Diagnosis not present

## 2020-12-22 DIAGNOSIS — R262 Difficulty in walking, not elsewhere classified: Secondary | ICD-10-CM | POA: Diagnosis not present

## 2020-12-22 DIAGNOSIS — R296 Repeated falls: Secondary | ICD-10-CM | POA: Diagnosis not present

## 2020-12-22 DIAGNOSIS — G2581 Restless legs syndrome: Secondary | ICD-10-CM | POA: Diagnosis not present

## 2020-12-22 DIAGNOSIS — R251 Tremor, unspecified: Secondary | ICD-10-CM | POA: Diagnosis not present

## 2020-12-22 DIAGNOSIS — G20 Parkinson's disease: Secondary | ICD-10-CM | POA: Diagnosis not present

## 2020-12-22 DIAGNOSIS — R29898 Other symptoms and signs involving the musculoskeletal system: Secondary | ICD-10-CM | POA: Diagnosis not present

## 2020-12-23 ENCOUNTER — Telehealth: Payer: Self-pay | Admitting: Primary Care

## 2020-12-23 DIAGNOSIS — I509 Heart failure, unspecified: Secondary | ICD-10-CM | POA: Diagnosis not present

## 2020-12-23 DIAGNOSIS — Z9181 History of falling: Secondary | ICD-10-CM | POA: Diagnosis not present

## 2020-12-23 DIAGNOSIS — J189 Pneumonia, unspecified organism: Secondary | ICD-10-CM | POA: Diagnosis not present

## 2020-12-23 DIAGNOSIS — R69 Illness, unspecified: Secondary | ICD-10-CM | POA: Diagnosis not present

## 2020-12-23 DIAGNOSIS — E785 Hyperlipidemia, unspecified: Secondary | ICD-10-CM | POA: Diagnosis not present

## 2020-12-23 DIAGNOSIS — G20 Parkinson's disease: Secondary | ICD-10-CM | POA: Diagnosis not present

## 2020-12-23 DIAGNOSIS — F4312 Post-traumatic stress disorder, chronic: Secondary | ICD-10-CM | POA: Diagnosis not present

## 2020-12-23 DIAGNOSIS — I110 Hypertensive heart disease with heart failure: Secondary | ICD-10-CM | POA: Diagnosis not present

## 2020-12-23 NOTE — Telephone Encounter (Signed)
Called [facility] to get notes not able to reach anyone with office will call back later

## 2020-12-23 NOTE — Telephone Encounter (Signed)
Called patient she was seen at [facility] over 2 weeks ago. She is not having any symptoms follow up has been made for next week. Will call for notes from [facility] now.

## 2020-12-23 NOTE — Telephone Encounter (Signed)
Patient called in states that she was seen by an urgent care and they told the patient to followup with her pcp. Patient states that she has double pnemonuia . Patient states that she is not having any current symptoms can I bring her in the office. Please advise. EM

## 2020-12-24 ENCOUNTER — Telehealth: Payer: Self-pay

## 2020-12-24 ENCOUNTER — Other Ambulatory Visit: Payer: Self-pay

## 2020-12-24 ENCOUNTER — Ambulatory Visit (INDEPENDENT_AMBULATORY_CARE_PROVIDER_SITE_OTHER): Payer: Medicare HMO

## 2020-12-24 DIAGNOSIS — I10 Essential (primary) hypertension: Secondary | ICD-10-CM | POA: Diagnosis not present

## 2020-12-24 DIAGNOSIS — E7800 Pure hypercholesterolemia, unspecified: Secondary | ICD-10-CM | POA: Diagnosis not present

## 2020-12-24 DIAGNOSIS — R739 Hyperglycemia, unspecified: Secondary | ICD-10-CM

## 2020-12-24 DIAGNOSIS — R7303 Prediabetes: Secondary | ICD-10-CM

## 2020-12-24 NOTE — Telephone Encounter (Signed)
Yes, I do recommend she come in for follow up chest xray, please set up office visit for the week of May 23rd.

## 2020-12-24 NOTE — Telephone Encounter (Signed)
Pt cancelled PCP follow up appt next week due to funeral. She went to urgent care 12/07/20 for bilateral pneumonia. Denies any residual symptoms. She states urgent care recommended chest x-ray in 4 weeks. Would like to know if this is still needed.  [name], PharmD Clinical Pharmacist [facility] at [facility] [phone]

## 2020-12-24 NOTE — Progress Notes (Signed)
Chronic Care Management Pharmacy Note  12/24/2020 Name:  Selena Bush MRN:  [id number] DOB:  11-14-1945  Subjective: Selena Bush is an 75 y.o. year old female who is a primary patient of Pleas Koch, NP.  The CCM team was consulted for assistance with disease management and care coordination needs.    Engaged with patient by telephone for follow up visit in response to provider referral for pharmacy case management and/or care coordination services. Patient reports doing much better since she had pneumonia 12/07/20. No coughing or other residual symptoms.   Consent to Services:  The patient was given information about Chronic Care Management services, agreed to services, and gave verbal consent prior to initiation of services.  Please see initial visit note for detailed documentation.   Patient Care Team: Pleas Koch, NP as PCP - General (Internal Medicine) Bary Castilla Forest Gleason, MD (General Surgery) Corey Harold, MD as Consulting Physician [name], [facility] as Pharmacist (Pharmacist)  Recent office visits:  12/14/2020 Dr. Einar Pheasant - Urgent Care follow up. CHF, on Lasix, no edema today. Pneumonia - improving. Follow Up in 4-6 weeks.    Recent consult visits:  12/22/2020 Neurology - Add an additional Carbidopa/levodopa at night for RLS 12/07/2020 Urgent Care, double pneumonia - Levofloxacin x 5 days  10/12/2020  [facility] Coral, Neurology - Continue current medications  10/09/2020  [facility] - [place]               Hospital visits:  None in previous 6 months  Allergies  Allergen Reactions   Penicillins Anaphylaxis    anaphylaxis  Has patient had a PCN reaction causing immediate rash, facial/tongue/throat swelling, SOB or lightheadedness with hypotension: Yes Has patient had a PCN reaction causing severe rash involving mucus membranes or skin necrosis: No Has patient had a PCN reaction that required hospitalization: No Has patient had a PCN reaction  occurring within the last 10 years: No If all of the above answers are "NO", then may proceed with Cephalosporin use.    Sulfa Antibiotics Anaphylaxis   Codeine Other (See Comments) and Nausea And Vomiting    Gi problems    Statins Other (See Comments)    Leg pains   Aspirin Other (See Comments)    "burned my stomach intensely" Abdominal pain and burning    Objective:  Lab Results  Component Value Date   CREATININE 0.99 06/01/2020   BUN 17 06/01/2020   GFR 56.91 (L) 06/01/2020   GFRNONAA 47 (L) 07/15/2019   GFRAA 54 (L) 07/15/2019   NA 138 06/01/2020   K 4.3 06/01/2020   CALCIUM 9.9 06/01/2020   CO2 32 06/01/2020   GLUCOSE 101 (H) 06/01/2020    Lab Results  Component Value Date/Time   HGBA1C 5.9 06/01/2020 11:11 AM   HGBA1C 5.6 10/30/2019 03:37 PM   GFR 56.91 (L) 06/01/2020 11:11 AM   GFR 43.39 (L) 10/30/2019 03:37 PM    Lab Results  Component Value Date   CHOL 165 12/31/2020   HDL 62 12/31/2020   LDLCALC 84 12/31/2020   TRIG 109 12/31/2020   CHOLHDL 2.7 12/31/2020    Hepatic Function Latest Ref Rng & Units 12/31/2020 10/30/2019 10/16/2018  Total Protein 6.0 - 8.5 g/dL 6.3 6.8 7.2  Albumin 3.7 - 4.7 g/dL 3.9 4.0 4.1  AST 0 - 40 IU/L $Remov'8 17 16  'okIHMj$ ALT 0 - 32 IU/L $Remov'4 11 14  'AoUZKA$ Alk Phosphatase 44 - 121 IU/L 107 92 114  Total Bilirubin 0.0 -  1.2 mg/dL 0.5 0.4 0.4  Bilirubin, Direct 0.00 - 0.40 mg/dL 0.14 - -    Lab Results  Component Value Date/Time   TSH 3.230 01/05/2018 05:46 PM   TSH 3.310 11/04/2017 10:11 AM   TSH 4.690 (H) 05/07/2015 09:02 AM    CBC Latest Ref Rng & Units 10/30/2019 08/12/2018 01/05/2018  WBC 4.0 - 10.5 K/uL 6.5 7.1 7.4  Hemoglobin 12.0 - 15.0 g/dL 14.2 11.6(L) 10.9(L)  Hematocrit 36.0 - 46.0 % 42.5 37.8 35.4  Platelets 150.0 - 400.0 K/uL 248.0 236 297    No results found for: VD25OH  Clinical ASCVD: No  The 10-year ASCVD risk score Mikey Bussing DC Jr., et al., 2013) is: 14.7%   Values used to calculate the score:     Age: 6 years     Sex:  Female     Is Non-Hispanic African American: No     Diabetic: No     Tobacco smoker: No     Systolic Blood Pressure: 573 mmHg     Is BP treated: Yes     HDL Cholesterol: 62 mg/dL     Total Cholesterol: 165 mg/dL    Depression screen [facility] 2/9 01/08/2021 06/08/2020 02/14/2018  Decreased Interest 0 0 0  Down, Depressed, Hopeless 0 0 0  PHQ - 2 Score 0 0 0  Altered sleeping 1 - -  Tired, decreased energy 1 - -  Change in appetite 1 - -  Feeling bad or failure about yourself  0 - -  Trouble concentrating 0 - -  Moving slowly or fidgety/restless 0 - -  Suicidal thoughts 0 - -  PHQ-9 Score 3 - -  Difficult doing work/chores Not difficult at all - -  Some recent data might be hidden    Social History   Tobacco Use  Smoking Status Never  Smokeless Tobacco Never  Tobacco Comments   social smoker as a teen   BP Readings from Last 3 Encounters:  01/08/21 128/74  12/14/20 118/80  10/09/20 (!) 153/102   Pulse Readings from Last 3 Encounters:  01/08/21 64  12/14/20 61  10/09/20 64   Wt Readings from Last 3 Encounters:  01/08/21 177 lb (80.3 kg)  12/14/20 177 lb (80.3 kg)  07/13/20 191 lb (86.6 kg)   BMI Readings from Last 3 Encounters:  01/08/21 34.00 kg/m  12/14/20 34.00 kg/m  07/13/20 37.30 kg/m    Assessment/Interventions: Review of patient past medical history, allergies, medications, health status, including review of consultants reports, laboratory and other test data, was performed as part of comprehensive evaluation and provision of chronic care management services.   SDOH:  (Social Determinants of Health) assessments and interventions performed: Yes SDOH Interventions    Flowsheet Row Most Recent Value  SDOH Interventions   Financial Strain Interventions Other (Comment)  [Med cost review]      SDOH Screenings   Alcohol Screen: Not on file  Depression (PHQ2-9): Low Risk    PHQ-2 Score: 3  Financial Resource Strain: High Risk   Difficulty of Paying Living  Expenses: Hard  Food Insecurity: Not on file  Housing: Not on file  Physical Activity: Not on file  Social Connections: Not on file  Stress: Not on file  Tobacco Use: Low Risk    Smoking Tobacco Use: Never   Smokeless Tobacco Use: Never  Transportation Needs: Not on file    [place]  Allergies  Allergen Reactions   Penicillins Anaphylaxis    anaphylaxis  Has patient had  a PCN reaction causing immediate rash, facial/tongue/throat swelling, SOB or lightheadedness with hypotension: Yes Has patient had a PCN reaction causing severe rash involving mucus membranes or skin necrosis: No Has patient had a PCN reaction that required hospitalization: No Has patient had a PCN reaction occurring within the last 10 years: No If all of the above answers are "NO", then may proceed with Cephalosporin use.    Sulfa Antibiotics Anaphylaxis   Codeine Other (See Comments) and Nausea And Vomiting    Gi problems    Statins Other (See Comments)    Leg pains   Aspirin Other (See Comments)    "burned my stomach intensely" Abdominal pain and burning    Medications Reviewed Today     Reviewed by Doreene Nest, NP (Nurse Practitioner) on 01/08/21 at 1532  Med List Status: <None>   Medication Order Taking? Sig Documenting Provider Last Dose Status Informant  albuterol (VENTOLIN HFA) 108 (90 Base) MCG/ACT inhaler [id number] Yes Inhale 2 puffs into the lungs every 4 (four) hours as needed. [provider] Taking Active   bisacodyl (DULCOLAX) 5 MG EC tablet [id number] Yes Take two tablets morning and two tablets afternoon day prior to Miralax prep. [provider] Taking Active   carbidopa-levodopa (SINEMET IR) 25-100 MG tablet [id number] Yes Take by mouth. Take 2 tablets at breakfast, 1 tab at lunch, 1 in the afternoon and 1 at night. [provider] Taking Active   diltiazem (CARDIZEM CD) 180 MG 24 hr capsule [id number] Yes TAKE ONE TABLET BY MOUTH EVERY MORNING Crenshaw,  Madolyn Frieze, MD Taking Active   DULoxetine (CYMBALTA) 60 MG capsule [id number] Yes TAKE 1 CAPSULE BY MOUTH 2 TIMES A DAY for anxiety and depression. Doreene Nest, NP Taking Active   ezetimibe (ZETIA) 10 MG tablet [id number]  Take 1 tablet (10 mg total) by mouth daily. Lewayne Bunting, MD  Expired 10/11/20 2359   fluticasone (FLONASE) 50 MCG/ACT nasal spray [id number] Yes Place 2 sprays into both nostrils daily as needed for rhinitis or allergies. Doreene Nest, NP Taking Active   furosemide (LASIX) 20 MG tablet [id number] Yes TAKE ONE TABLET BY MOUTH EVERY MORNING Jens Som Madolyn Frieze, MD Taking Active   gabapentin (NEURONTIN) 100 MG capsule [id number] Yes TAKE 1 CAPSULE BY MOUTH THREE TIMES A DAY for anxiety. Doreene Nest, NP Taking Active   gabapentin (NEURONTIN) 300 MG capsule [id number] Yes Take by mouth. [provider] Taking Active   hydrocortisone 2.5 % cream [id number] Yes Apply to affected under breasts 1-2 times a day as directed and as needed for itch. Can also use on face as needed for itch Willeen Niece, MD Taking Active   ketoconazole (NIZORAL) 2 % cream [id number] Yes Apply to affected areas under breast twice daily for rash until healed. Can also use on face as needed for dermatitis Willeen Niece, MD Taking Active   metoCLOPramide (REGLAN) 5 MG tablet [id number] Yes Take one tablet 30 minutes prior to colonoscopy prep. May repeat in 4 hours if needed. [provider] Taking Active   metoprolol tartrate (LOPRESSOR) 25 MG tablet [id number] Yes TAKE 3 TABLETS BY MOUTH 2 TIMES DAILY. Lewayne Bunting, MD Taking Active   metroNIDAZOLE (METROCREAM) 0.75 % cream [id number] Yes Apply topically 2 (two) times daily. Apply to red areas of face Willeen Niece, MD Taking Active   polyethylene glycol powder (GLYCOLAX/MIRALAX) 17 GM/SCOOP powder [id number] Yes One bottle for colonoscopy prep. Use as directed. [provider] Taking Active  potassium chloride SA (KLOR-CON)  20 MEQ tablet [id number] Yes TAKE ONE TABLET BY MOUTH EVERY MORNING Crenshaw, Denice Bors, MD Taking Active   sotalol (BETAPACE) 120 MG tablet [id number] Yes TAKE ONE TABLET BY MOUTH EVERY MORNING and TAKE ONE TABLET BY MOUTH EVERY EVENING Crenshaw, Denice Bors, MD Taking Active   tiZANidine (ZANAFLEX) 4 MG tablet [id number] Yes TAKE 2 TABLET (4 MG TOTAL) BY MOUTH DAILY FOR MUSCLE SPASMS AT BEDTIME Pleas Koch, NP Taking Active             Patient Active Problem List   Diagnosis Date Noted   Pneumonia of both lungs due to infectious organism 01/08/2021   Parkinson's disease ([place]) 07/29/2019   Cat bite of right hand 03/25/2019   DDD (degenerative disc disease), lumbar 10/19/2018   Preventative health care 10/19/2018   Prediabetes 10/19/2018   (HFpEF) heart failure with preserved ejection fraction ([place]) 12/12/2017   Pericardial effusion with cardiac tamponade 11/13/2017   Paroxysmal atrial fibrillation ([place]) 11/05/2017   Anxiety 03/21/2016   Insomnia 07/17/2015   MI (mitral incompetence) 12/10/2014   Major depressive disorder 12/09/2014   OSA (obstructive sleep apnea) 12/09/2014   GERD (gastroesophageal reflux disease) 12/09/2014   Hyperglycemia 12/09/2014   Chronic diastolic CHF (congestive heart failure) ([place])    HLD (hyperlipidemia)    Osteoarthritis of both knees    S/P left TKA 08/19/2014   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 (BMI) 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 ([place]) 04/17/2013   History of ductal carcinoma in situ (DCIS) of breast 11/05/2012   Essential hypertension, benign 11/05/2012    Immunization History  Administered Date(s) Administered   Fluad Quad(high Dose 65+) 06/11/2019, 06/08/2020   Influenza, High Dose Seasonal PF 08/10/2014, 05/05/2015, 07/30/2018   Influenza,inj,Quad PF,6+ Mos 06/07/2016   PFIZER(Purple Top)SARS-COV-2 Vaccination 09/03/2019, 09/24/2019   Pneumococcal Conjugate-13 02/06/2016   Pneumococcal Polysaccharide-23 09/22/2014   Td  09/01/2018   Zoster Recombinat (Shingrix) 10/19/2018, 12/27/2019    Conditions to be addressed/monitored:  Hypertension, Hyperlipidemia, Diabetes, Atrial Fibrillation, and Heart Failure  Care Plan : [place]  Updates made by [name], [place] since 01/25/2021 12:00 AM     Problem: CHL AMB "PATIENT-SPECIFIC PROBLEM"      Long-Range Goal: Disease Management   Start Date: 12/24/2020  Priority: High  Note:   Current Barriers:  None identified  Pharmacist Clinical Goal(s):  Patient will contact provider office for questions/concerns as evidenced notation of same in electronic health record through collaboration with PharmD and provider.   Interventions: 1:1 collaboration with Pleas Koch, NP regarding development and update of comprehensive plan of care as evidenced by provider attestation and co-signature Inter-disciplinary care team collaboration (see longitudinal plan of care) Comprehensive medication review performed; medication list updated in electronic medical record  Hypertension (BP goal <140/90) -Controlled - per clinic and home readings  -Current treatment: Metoprolol tartrate 25 mg - 2 tablets twice daily Furosemide 40 mg - one-half tablet daily Diltiazem 180 mg - 1 capsule daily -Medications previously tried: Lisinopril-HCTZ (stopped after metoprolol and sotalol were added for AFIB) -Current home readings: PT and OT complete vital signs when they visit once weekly. Pt reports BP and HR have been good. Reports BP and HR always good. Oxygen good.  -Denies hypotensive/hypertensive symptoms -Educated on Symptoms of hypotension and importance of maintaining adequate hydration; -Recommended to continue current medication  Hyperlipidemia: (LDL goal < 100) -Not ideally controlled - LDL 132 - due for updated lipid panel. Panel has not  been updated since she restarted Zetia 06/2020. Zetia started per cardiology. -Current treatment: OTC fish oil - 2  capsules daily  Zetia 10 mg - 1 daily (restarted 06/2020) -Medications previously tried: statin intolerant  -Educated on Cholesterol goals;  Benefits of statin for ASCVD risk reduction; -Recommended to continue current medication  Pre-Diabetes (A1c goal <7%) -Controlled - A1c 5.9% -Current medications: None -Medications previously tried: none reported  -Current home glucose readings - none  -Denies hypoglycemic/hyperglycemic symptoms -Counseled to check feet daily and get yearly eye exams -Recommended continue lifestyle management  Heart Failure (Goal: manage symptoms and prevent exacerbations) -Controlled - per patient report of no symptoms  -Last ejection fraction: 65-70% (Date: 05/2018) -HF type: Diastolic -Current treatment: Furosemide 20 mg - 1 every morning -Denies any swelling or shortness of breath. -Medications previously tried: none reported  -Current home BP/HR readings: within goal  -Educated on Importance of weighing daily; if you gain more than 3 pounds in one day or 5 pounds in one week, contact your doctor  -Recommended to continue current medication   Patient Goals/Self-Care Activities Patient will:  - contact provider office with any health concerns       Medication Assistance:  Reports paying ~$100 per month total for medications and this is difficult to afford.   Reviewed medication cost and cash price will be less expensive compared to insurance price for gabapentin. Let pharmacy know to run cash price going forward.  Patient's preferred pharmacy is: [facility] - [place], [place] - [address] [place] [address] Phone: [phone] Fax: [phone]  Uses pill box? Yes - Adherence Packaging Pt endorses 100% compliance  Care Plan and Follow Up Patient Decision:  Patient agrees to Care Plan and Follow-up.  [name], PharmD Clinical Pharmacist [facility] at [facility] [phone]

## 2020-12-25 NOTE — Telephone Encounter (Signed)
Called patient appointment needed for late afternoon first available is next Friday. Have made that appointment if any changes she will call and let our office know.

## 2020-12-28 DIAGNOSIS — E785 Hyperlipidemia, unspecified: Secondary | ICD-10-CM | POA: Diagnosis not present

## 2020-12-28 DIAGNOSIS — G20 Parkinson's disease: Secondary | ICD-10-CM | POA: Diagnosis not present

## 2020-12-28 DIAGNOSIS — I509 Heart failure, unspecified: Secondary | ICD-10-CM | POA: Diagnosis not present

## 2020-12-28 DIAGNOSIS — Z9181 History of falling: Secondary | ICD-10-CM | POA: Diagnosis not present

## 2020-12-28 DIAGNOSIS — J189 Pneumonia, unspecified organism: Secondary | ICD-10-CM | POA: Diagnosis not present

## 2020-12-28 DIAGNOSIS — I110 Hypertensive heart disease with heart failure: Secondary | ICD-10-CM | POA: Diagnosis not present

## 2020-12-30 ENCOUNTER — Telehealth: Payer: Self-pay

## 2020-12-30 ENCOUNTER — Ambulatory Visit: Payer: Medicare HMO | Admitting: Primary Care

## 2020-12-30 NOTE — Chronic Care Management (AMB) (Addendum)
Chronic Care Management Pharmacy Assistant   Name: Selena Bush  MRN: [id number] DOB: 08-21-1945  Reason for Encounter: Medication Review- Medication Adherence and Delivery Coordination  Recent office visits:  None since last CCM visit  Recent consult visits:  None since last CCM visit  Hospital visits:  None in previous 6 months  Medications: Outpatient Encounter Medications as of 12/30/2020  Medication Sig   albuterol (VENTOLIN HFA) 108 (90 Base) MCG/ACT inhaler Inhale 2 puffs into the lungs every 4 (four) hours as needed. (Patient not taking: Reported on 12/25/2020)   carbidopa-levodopa (SINEMET IR) 25-100 MG tablet Take by mouth. Take 2 tablets at breakfast, 1 tab at lunch, 1 in the afternoon and 1 at night.   diltiazem (CARDIZEM CD) 180 MG 24 hr capsule TAKE ONE TABLET BY MOUTH EVERY MORNING   DULoxetine (CYMBALTA) 60 MG capsule TAKE 1 CAPSULE BY MOUTH 2 TIMES A DAY for anxiety and depression.   ezetimibe (ZETIA) 10 MG tablet Take 1 tablet (10 mg total) by mouth daily.   fluticasone (FLONASE) 50 MCG/ACT nasal spray Place 2 sprays into both nostrils daily as needed for rhinitis or allergies.   furosemide (LASIX) 20 MG tablet TAKE ONE TABLET BY MOUTH EVERY MORNING   gabapentin (NEURONTIN) 100 MG capsule TAKE 1 CAPSULE BY MOUTH THREE TIMES A DAY for anxiety.   hydrocortisone 2.5 % cream Apply to affected areas face 1-2 times a day as directed and as needed. (Patient not taking: Reported on 12/25/2020)   ketoconazole (NIZORAL) 2 % cream Apply to affected areas face 1-2 times a day as directed. (Patient not taking: Reported on 12/25/2020)   ketoconazole (NIZORAL) 2 % shampoo Apply to scalp QOD, let sit several mins before rinsing. (Patient not taking: Reported on 12/25/2020)   metoprolol tartrate (LOPRESSOR) 25 MG tablet TAKE 3 TABLETS BY MOUTH 2 TIMES DAILY.   potassium chloride SA (KLOR-CON) 20 MEQ tablet TAKE ONE TABLET BY MOUTH EVERY MORNING   sotalol (BETAPACE) 120 MG tablet  TAKE ONE TABLET BY MOUTH EVERY MORNING and TAKE ONE TABLET BY MOUTH EVERY EVENING   tiZANidine (ZANAFLEX) 4 MG tablet TAKE 2 TABLET (4 MG TOTAL) BY MOUTH DAILY FOR MUSCLE SPASMS AT BEDTIME   No facility-administered encounter medications on file as of 12/30/2020.   BP Readings from Last 3 Encounters:  12/14/20 118/80  10/09/20 (!) 153/102  07/13/20 134/74    Lab Results  Component Value Date   HGBA1C 5.9 06/01/2020    Patient obtains medications through Adherence Packaging  30 Days   Last adherence delivery date:  12/07/20  Patient is due for next adherence delivery on: 01/07/21  Spoke with patient on 12/31/20 and reviewed medications and coordinated delivery.  This delivery to include: Adherence Packaging  30 Days  Packs: Furosemide 20mg  1 tablet daily (breakfast) Ezetimibe 10mg  1 tablet daily (breakfast) Diltiazem 180mg  1 tablet daily (breakfast) Potassium chloride 55meq 1 tablet daily (breakfast) Sotalol 120mg  1 tablet twice daily (1 breakfast and 1 evening meal) Gabapentin 100mg  1 capsule three times a day (1 breakfast, 1 evening meal, 1 bedtime)  Gabapentin 300mg  2 caps at (2 bedtime) Metoprolol 25mg  3 tablets twice a day (3 breakfast and 3 evening meal) Duloxentine 60mg  1 tablet twice a day (1 breakfast, 1 evening meal)  PRN/VIAL medications: Carbidopa-levodopa 25mg -100 mg  6 am: 2 tabs / 9 am: 1 tab/ 12 pm: 1 tab/ 3 pm: 1 tab/ 6 pm: 1 tab/ 9 pm: 2 tabs Tizanidine 4mg  2 tablets daily (2  bedtime)  Patient declined the following medications this month: Fluticasone Propionate 50 mcg Place 2 sprays into both nostrils daily  - PRN Ketoconazole Shampoo 2%-  prn use pt. reports she does not use this.  Hydrocortisone 2.5% - PRN use  Metoclopramide 5 mg- Take 1 tablet 30 mins prior to colonoscopy prep. May repeat in 4 hours if needed. (Colonoscopy not yet scheduled)   Patient needs refill on: carbidopa levodopa, requested from neurology.  Confirmed delivery date of 01/07/21,  advised patient that pharmacy will contact her the morning of delivery.  Recent blood pressure readings are as follows: 130/80  Follow-Up:  Pharmacist Review  [name], CPP notified  Margaretmary Dys, [place] Pharmacy Assistant [phone]  I have reviewed the care management and care coordination activities outlined in this encounter and I am certifying that I agree with the content of this note. No further action required.  [name], PharmD Clinical Pharmacist [facility] at [facility] [phone]

## 2020-12-31 DIAGNOSIS — J189 Pneumonia, unspecified organism: Secondary | ICD-10-CM | POA: Diagnosis not present

## 2020-12-31 DIAGNOSIS — I110 Hypertensive heart disease with heart failure: Secondary | ICD-10-CM | POA: Diagnosis not present

## 2020-12-31 DIAGNOSIS — E7800 Pure hypercholesterolemia, unspecified: Secondary | ICD-10-CM | POA: Diagnosis not present

## 2020-12-31 DIAGNOSIS — I509 Heart failure, unspecified: Secondary | ICD-10-CM | POA: Diagnosis not present

## 2020-12-31 DIAGNOSIS — E785 Hyperlipidemia, unspecified: Secondary | ICD-10-CM | POA: Diagnosis not present

## 2020-12-31 DIAGNOSIS — Z9181 History of falling: Secondary | ICD-10-CM | POA: Diagnosis not present

## 2020-12-31 DIAGNOSIS — G20 Parkinson's disease: Secondary | ICD-10-CM | POA: Diagnosis not present

## 2020-12-31 LAB — HEPATIC FUNCTION PANEL
ALT: 4 IU/L (ref 0–32)
AST: 8 IU/L (ref 0–40)
Albumin: 3.9 g/dL (ref 3.7–4.7)
Alkaline Phosphatase: 107 IU/L (ref 44–121)
Bilirubin Total: 0.5 mg/dL (ref 0.0–1.2)
Bilirubin, Direct: 0.14 mg/dL (ref 0.00–0.40)
Total Protein: 6.3 g/dL (ref 6.0–8.5)

## 2020-12-31 LAB — LIPID PANEL
Chol/HDL Ratio: 2.7 ratio (ref 0.0–4.4)
Cholesterol, Total: 165 mg/dL (ref 100–199)
HDL: 62 mg/dL (ref >39)
LDL Chol Calc (NIH): 84 mg/dL (ref 0–99)
Triglycerides: 109 mg/dL (ref 0–149)
VLDL Cholesterol Cal: 19 mg/dL (ref 5–40)

## 2021-01-04 ENCOUNTER — Telehealth: Payer: Self-pay | Admitting: Primary Care

## 2021-01-04 DIAGNOSIS — F324 Major depressive disorder, single episode, in partial remission: Secondary | ICD-10-CM

## 2021-01-04 DIAGNOSIS — E785 Hyperlipidemia, unspecified: Secondary | ICD-10-CM | POA: Diagnosis not present

## 2021-01-04 DIAGNOSIS — F419 Anxiety disorder, unspecified: Secondary | ICD-10-CM

## 2021-01-04 DIAGNOSIS — I509 Heart failure, unspecified: Secondary | ICD-10-CM | POA: Diagnosis not present

## 2021-01-04 DIAGNOSIS — Z9181 History of falling: Secondary | ICD-10-CM | POA: Diagnosis not present

## 2021-01-04 DIAGNOSIS — J189 Pneumonia, unspecified organism: Secondary | ICD-10-CM | POA: Diagnosis not present

## 2021-01-04 DIAGNOSIS — I110 Hypertensive heart disease with heart failure: Secondary | ICD-10-CM | POA: Diagnosis not present

## 2021-01-04 DIAGNOSIS — G20 Parkinson's disease: Secondary | ICD-10-CM | POA: Diagnosis not present

## 2021-01-04 NOTE — Telephone Encounter (Signed)
  LAST APPOINTMENT DATE: 12/30/2020   NEXT APPOINTMENT DATE:@5 /27/2022  MEDICATION: duloxetine  [facility]   Let patient know to contact pharmacy at the end of the day to make sure medication is ready.  Please notify patient to allow 48-72 hours to process  Encourage patient to contact the pharmacy for refills or they can request refills through [place]:   LAST REFILL:  QTY:  REFILL DATE:    OTHER COMMENTS:    Okay for refill?  Please advise

## 2021-01-05 ENCOUNTER — Other Ambulatory Visit: Payer: Self-pay

## 2021-01-05 ENCOUNTER — Ambulatory Visit: Payer: Medicare HMO | Admitting: Dermatology

## 2021-01-05 DIAGNOSIS — L304 Erythema intertrigo: Secondary | ICD-10-CM | POA: Diagnosis not present

## 2021-01-05 DIAGNOSIS — L719 Rosacea, unspecified: Secondary | ICD-10-CM | POA: Diagnosis not present

## 2021-01-05 DIAGNOSIS — Z86018 Personal history of other benign neoplasm: Secondary | ICD-10-CM | POA: Diagnosis not present

## 2021-01-05 DIAGNOSIS — L219 Seborrheic dermatitis, unspecified: Secondary | ICD-10-CM

## 2021-01-05 DIAGNOSIS — L814 Other melanin hyperpigmentation: Secondary | ICD-10-CM | POA: Diagnosis not present

## 2021-01-05 DIAGNOSIS — D692 Other nonthrombocytopenic purpura: Secondary | ICD-10-CM | POA: Diagnosis not present

## 2021-01-05 DIAGNOSIS — Z1283 Encounter for screening for malignant neoplasm of skin: Secondary | ICD-10-CM | POA: Diagnosis not present

## 2021-01-05 DIAGNOSIS — L820 Inflamed seborrheic keratosis: Secondary | ICD-10-CM

## 2021-01-05 DIAGNOSIS — L578 Other skin changes due to chronic exposure to nonionizing radiation: Secondary | ICD-10-CM

## 2021-01-05 DIAGNOSIS — L570 Actinic keratosis: Secondary | ICD-10-CM | POA: Diagnosis not present

## 2021-01-05 DIAGNOSIS — D229 Melanocytic nevi, unspecified: Secondary | ICD-10-CM

## 2021-01-05 DIAGNOSIS — L821 Other seborrheic keratosis: Secondary | ICD-10-CM

## 2021-01-05 DIAGNOSIS — D1800 Hemangioma unspecified site: Secondary | ICD-10-CM

## 2021-01-05 MED ORDER — HYDROCORTISONE 2.5 % EX CREA: TOPICAL_CREAM | CUTANEOUS | 3 refills | Status: DC

## 2021-01-05 MED ORDER — KETOCONAZOLE 2 % EX CREA: TOPICAL_CREAM | CUTANEOUS | 5 refills | Status: DC

## 2021-01-05 MED ORDER — DULOXETINE HCL 60 MG PO CPEP: ORAL_CAPSULE | ORAL | 1 refills | Status: DC

## 2021-01-05 MED ORDER — KETOCONAZOLE 2 % EX CREA: 1.0000 "application " | TOPICAL_CREAM | Freq: Two times a day (BID) | CUTANEOUS | 5 refills | Status: DC

## 2021-01-05 MED ORDER — METRONIDAZOLE 0.75 % EX CREA: TOPICAL_CREAM | Freq: Two times a day (BID) | CUTANEOUS | 3 refills | Status: DC

## 2021-01-05 MED ORDER — METRONIDAZOLE 0.75 % EX CREA: TOPICAL_CREAM | Freq: Two times a day (BID) | CUTANEOUS | 3 refills | Status: AC

## 2021-01-05 NOTE — Addendum Note (Signed)
Addended by: Pleas Koch on: 01/05/2021 01:20 PM   Modules accepted: Orders

## 2021-01-05 NOTE — Telephone Encounter (Signed)
Refills sent to pharmacy. 

## 2021-01-05 NOTE — Patient Instructions (Addendum)
Rosacea is a chronic progressive skin condition usually affecting the face of adults, causing redness and/or acne bumps. It is treatable but not curable. It sometimes affects the eyes (ocular rosacea) as well. It may respond to topical and/or systemic medication and can flare with stress, sun exposure, alcohol, exercise and some foods.  Daily application of broad spectrum spf 30+ sunscreen to face is recommended to reduce flares.   Melanoma ABCDEs  Melanoma is the most dangerous type of skin cancer, and is the leading cause of death from skin disease.  You are more likely to develop melanoma if you:  Have light-colored skin, light-colored eyes, or red or blond hair  Spend a lot of time in the sun  Tan regularly, either outdoors or in a tanning bed  Have had blistering sunburns, especially during childhood  Have a close family member who has had a melanoma  Have atypical moles or large birthmarks  Early detection of melanoma is key since treatment is typically straightforward and cure rates are extremely high if we catch it early.   The first sign of melanoma is often a change in a mole or a new dark spot.  The ABCDE system is a way of remembering the signs of melanoma.  A for asymmetry:  The two halves do not match. B for border:  The edges of the growth are irregular. C for color:  A mixture of colors are present instead of an even brown color. D for diameter:  Melanomas are usually (but not always) greater than 73mm - the size of a pencil eraser. E for evolution:  The spot keeps changing in size, shape, and color.  Please check your skin once per month between visits. You can use a small mirror in front and a large mirror behind you to keep an eye on the back side or your body.   If you see any new or changing lesions before your next follow-up, please call to schedule a visit.  Please continue daily skin protection including broad spectrum sunscreen SPF 30+ to sun-exposed areas,  reapplying every 2 hours as needed when you're outdoors.   Seborrheic Keratosis  What causes seborrheic keratoses? Seborrheic keratoses are harmless, common skin growths that first appear during adult life.  As time goes by, more growths appear.  Some people may develop a large number of them.  Seborrheic keratoses appear on both covered and uncovered body parts.  They are not caused by sunlight.  The tendency to develop seborrheic keratoses can be inherited.  They vary in color from skin-colored to gray, brown, or even black.  They can be either smooth or have a rough, warty surface.   Seborrheic keratoses are superficial and look as if they were stuck on the skin.  Under the microscope this type of keratosis looks like layers upon layers of skin.  That is why at times the top layer may seem to fall off, but the rest of the growth remains and re-grows.    Treatment Seborrheic keratoses do not need to be treated, but can easily be removed in the office.  Seborrheic keratoses often cause symptoms when they rub on clothing or jewelry.  Lesions can be in the way of shaving.  If they become inflamed, they can cause itching, soreness, or burning.  Removal of a seborrheic keratosis can be accomplished by freezing, burning, or surgery. If any spot bleeds, scabs, or grows rapidly, please return to have it checked, as these can be an indication  of a skin cancer.  If you have any questions or concerns for your doctor, please call our main line at [phone] and press option 4 to reach your doctor's medical assistant. If no one answers, please leave a voicemail as directed and we will return your call as soon as possible. Messages left after 4 pm will be answered the following business day.   You may also send [place] a message via [place]. We typically respond to MyChart messages within 1-2 business days.  For prescription refills, please ask your pharmacy to contact our office. Our fax number is  [phone].  If you have an urgent issue when the clinic is closed that cannot wait until the next business day, you can page your doctor at the number below.    Please note that while we do our best to be available for urgent issues outside of office hours, we are not available 24/7.   If you have an urgent issue and are unable to reach [place], you may choose to seek medical care at your doctor's office, retail clinic, urgent care center, or emergency room.  If you have a medical emergency, please immediately call 911 or go to the emergency department.  Pager Numbers  - Dr. Nehemiah Massed: [phone]  - Dr. Laurence Ferrari: [phone]  - Dr. Nicole Kindred: [phone]  In the event of inclement weather, please call our main line at [phone] for an update on the status of any delays or closures.  Dermatology Medication Tips: Please keep the boxes that topical medications come in in order to help keep track of the instructions about where and how to use these. Pharmacies typically print the medication instructions only on the boxes and not directly on the medication tubes.   If your medication is too expensive, please contact our office at [phone] option 4 or send [place] a message through [place].   We are unable to tell what your co-pay for medications will be in advance as this is different depending on your insurance coverage. However, we may be able to find a substitute medication at lower cost or fill out paperwork to get insurance to cover a needed medication.   If a prior authorization is required to get your medication covered by your insurance company, please allow [place] 1-2 business days to complete this process.  Drug prices often vary depending on where the prescription is filled and some pharmacies may offer cheaper prices.  The website www.goodrx.com contains coupons for medications through different pharmacies. The prices here do not account for what the cost may be with help from  insurance (it may be cheaper with your insurance), but the website can give you the price if you did not use any insurance.  - You can print the associated coupon and take it with your prescription to the pharmacy.  - You may also stop by our office during regular business hours and pick up a GoodRx coupon card.  - If you need your prescription sent electronically to a different pharmacy, notify our office through [facility] or by phone at [phone] option 4.   Intertrigo is a chronic recurrent rash that occurs in skin fold areas that may be associated with friction; heat; moisture; yeast; fungus; and bacteria.  It is exacerbated by increased movement / activity; sweating; and higher atmospheric temperature. Zeasorb Af Powder found at drug store to help with moisture under breast   Cryotherapy Aftercare  . Wash gently with soap and water everyday.   [name] Apply  Vaseline and Band-Aid daily until healed.  Actinic keratoses are precancerous spots that appear secondary to cumulative UV radiation exposure/sun exposure over time. They are chronic with expected duration over 1 year. A portion of actinic keratoses will progress to squamous cell carcinoma of the skin. It is not possible to reliably predict which spots will progress to skin cancer and so treatment is recommended to prevent development of skin cancer.  Recommend daily broad spectrum sunscreen SPF 30+ to sun-exposed areas, reapply every 2 hours as needed.  Recommend staying in the shade or wearing long sleeves, sun glasses (UVA+UVB protection) and wide brim hats (4-inch brim around the entire circumference of the hat). Call for new or changing lesions.

## 2021-01-05 NOTE — Progress Notes (Signed)
Follow-Up Visit   Subjective  Selena Bush is a 75 y.o. female who presents for the following: Annual Exam (Patient here today for tbse. She has history of dysplastic nevus. She denies any new concerns today at follow up. Red places under breast she states it started 2 and 3 days ago. ).  She has a few irritated growths that she would like removed on chest and face.  Patient here for full body skin exam and skin cancer screening.  The following portions of the chart were reviewed this encounter and updated as appropriate:       Objective  Well appearing patient in no apparent distress; mood and affect are within normal limits.  A full examination was performed including scalp, head, eyes, ears, nose, lips, neck, chest, axillae, abdomen, back, buttocks, bilateral upper extremities, bilateral lower extremities, hands, feet, fingers, toes, fingernails, and toenails. All findings within normal limits unless otherwise noted below.  Objective  scalp and face: Clear today   Objective  BL Inframammary Fold: Bright erythematous patches with satelite lesions    Objective  Head - Anterior (Face): Bright erythema on the left > right cheek - Gets worse when wearing mask per pt  Objective  Dorsum of Nose x 1: Erythematous thin papules/macules with gritty scale.   Objective  left chest x 1, left forehead x 2, right inframammary x 1 (4): Erythematous keratotic or waxy stuck-on papule or plaque.   Assessment & Plan  Seborrheic dermatitis scalp and face  Continue as needed ketoconazole 2 % shampoo apply to scalp qod, let sit several mins before rinsing.   Continue as needed hydrocortisone 2.5 % cream apply to face as needed for itch   Topical steroids (such as triamcinolone, fluocinolone, fluocinonide, mometasone, clobetasol, halobetasol, betamethasone, hydrocortisone) can cause thinning and lightening of the skin if they are used for too long in the same area. Your physician has  selected the right strength medicine for your problem and area affected on the body. Please use your medication only as directed by your physician to prevent side effects.    Reordered Medications hydrocortisone 2.5 % cream  Other Related Medications ketoconazole (NIZORAL) 2 % shampoo  Erythema intertrigo BL Inframammary Fold  With Candidiasis Intertrigo is a chronic recurrent rash that occurs in skin fold areas that may be associated with friction; heat; moisture; yeast; fungus; and bacteria.  It is exacerbated by increased movement / activity; sweating; and higher atmospheric temperature.   Ketoconazole apply to under breast twice daily until healed   Can use HC 2.5 % cream qd/bid as needed for itch  Zeasorb Af powder to help with moisture under breast   ketoconazole (NIZORAL) 2 % cream - BL Inframammary Fold  hydrocortisone 2.5 % cream - BL Inframammary Fold  Rosacea Head - Anterior (Face)  Rosacea is a chronic progressive skin condition usually affecting the face of adults, causing redness and/or acne bumps. It is treatable but not curable. It sometimes affects the eyes (ocular rosacea) as well. It may respond to topical and/or systemic medication and can flare with stress, sun exposure, alcohol, exercise and some foods.  Daily application of broad spectrum spf 30+ sunscreen to face is recommended to reduce flares.   Start Metrocream use 1 - 2 times daily for rosacea     Reordered Medications metroNIDAZOLE (METROCREAM) 0.75 % cream  Actinic keratosis Dorsum of Nose x 1  Prior to procedure, discussed risks of blister formation, small wound, skin dyspigmentation, or rare scar following  cryotherapy.    Destruction of lesion - Dorsum of Nose x 1  Destruction method: cryotherapy   Informed consent: discussed and consent obtained   Lesion destroyed using liquid nitrogen: Yes   Region frozen until ice ball extended beyond lesion: Yes   Outcome: patient tolerated  procedure well with no complications   Post-procedure details: wound care instructions given    Inflamed seborrheic keratosis (4) left chest x 1, left forehead x 2, right inframammary x 1  Prior to procedure, discussed risks of blister formation, small wound, skin dyspigmentation, or rare scar following cryotherapy.    Destruction of lesion - left chest x 1, left forehead x 2, right inframammary x 1  Destruction method: cryotherapy   Informed consent: discussed and consent obtained   Lesion destroyed using liquid nitrogen: Yes   Region frozen until ice ball extended beyond lesion: Yes   Outcome: patient tolerated procedure well with no complications   Post-procedure details: wound care instructions given     Lentigines - Scattered tan macules - Due to sun exposure - Benign-appering, observe - Recommend daily broad spectrum sunscreen SPF 30+ to sun-exposed areas, reapply every 2 hours as needed. - Call for any changes  Purpura - Chronic; persistent and recurrent.  Treatable, but not curable. - Violaceous macules and patches on hands and arms  - Benign - Related to trauma, age, sun damage and/or use of blood thinners, chronic use of topical and/or oral steroids - Observe - Can use OTC arnica containing moisturizer such as Dermend Bruise Formula if desired - Call for worsening or other concerns  Seborrheic Keratoses - Stuck-on, waxy, tan-brown papules and/or plaques at back - Benign-appearing - Discussed benign etiology and prognosis. - Observe - Call for any changes  Melanocytic Nevi - Tan-brown and/or pink-flesh-colored symmetric macules and papules - Benign appearing on exam today - Observation - Call clinic for new or changing moles - Recommend daily use of broad spectrum spf 30+ sunscreen to sun-exposed areas.   Hemangiomas - Red papules - Discussed benign nature - Observe - Call for any changes  Actinic Damage - Chronic condition, secondary to cumulative  UV/sun exposure - diffuse scaly erythematous macules with underlying dyspigmentation - Recommend daily broad spectrum sunscreen SPF 30+ to sun-exposed areas, reapply every 2 hours as needed.  - Staying in the shade or wearing long sleeves, sun glasses (UVA+UVB protection) and wide brim hats (4-inch brim around the entire circumference of the hat) are also recommended for sun protection.  - Call for new or changing lesions.  History of Dysplastic Nevi - No evidence of recurrence today on left medial ankle  - Recommend regular full body skin exams - Recommend daily broad spectrum sunscreen SPF 30+ to sun-exposed areas, reapply every 2 hours as needed.  - Call if any new or changing lesions are noted between office visits   Skin cancer screening performed today.  Return in about 1 year (around 01/05/2022), or if symptoms worsen or fail to improve, for tbse.  I, Ruthell Rummage, CMA, am acting as scribe for Brendolyn Patty, MD.  Documentation: I have reviewed the above documentation for accuracy and completeness, and I agree with the above.  Brendolyn Patty MD

## 2021-01-06 DIAGNOSIS — E785 Hyperlipidemia, unspecified: Secondary | ICD-10-CM | POA: Diagnosis not present

## 2021-01-06 DIAGNOSIS — I110 Hypertensive heart disease with heart failure: Secondary | ICD-10-CM | POA: Diagnosis not present

## 2021-01-06 DIAGNOSIS — J189 Pneumonia, unspecified organism: Secondary | ICD-10-CM | POA: Diagnosis not present

## 2021-01-06 DIAGNOSIS — I509 Heart failure, unspecified: Secondary | ICD-10-CM | POA: Diagnosis not present

## 2021-01-06 DIAGNOSIS — Z9181 History of falling: Secondary | ICD-10-CM | POA: Diagnosis not present

## 2021-01-06 DIAGNOSIS — G20 Parkinson's disease: Secondary | ICD-10-CM | POA: Diagnosis not present

## 2021-01-07 NOTE — Progress Notes (Signed)
[facility] received a few new medications and wanted to know if patient needed them filled. Patient denied needing metronidazole 0.75%, hydrocortisone 2.5%, ketoconazole 2% shampoo and ketoconazole 2% cream. She stated she would call when she needed them.

## 2021-01-08 ENCOUNTER — Other Ambulatory Visit: Payer: Self-pay

## 2021-01-08 ENCOUNTER — Ambulatory Visit (INDEPENDENT_AMBULATORY_CARE_PROVIDER_SITE_OTHER)
Admission: RE | Admit: 2021-01-08 | Discharge: 2021-01-08 | Disposition: A | Discharge status: Home / Self Care | Payer: Medicare HMO | Source: Ambulatory Visit | Attending: Primary Care | Admitting: Primary Care

## 2021-01-08 ENCOUNTER — Ambulatory Visit (INDEPENDENT_AMBULATORY_CARE_PROVIDER_SITE_OTHER): Payer: Medicare HMO | Admitting: Primary Care

## 2021-01-08 ENCOUNTER — Encounter: Payer: Self-pay | Admitting: Primary Care

## 2021-01-08 VITALS — BP 128/74 | HR 64 | Temp 97.8°F | Ht 60.5 in | Wt 177.0 lb

## 2021-01-08 DIAGNOSIS — J189 Pneumonia, unspecified organism: Secondary | ICD-10-CM | POA: Diagnosis not present

## 2021-01-08 HISTORY — DX: Pneumonia, unspecified organism: J18.9

## 2021-01-08 NOTE — Patient Instructions (Signed)
Complete xray(s) prior to leaving today. I will notify you of your results once received.  It was a pleasure to see you today!  

## 2021-01-08 NOTE — Progress Notes (Signed)
Subjective:    Patient ID: Selena Bush, female    DOB: [birth year], 75 y.o.   MRN: [id number]  HPI  [name] is a very pleasant 75 y.o. female with a history of CHF, MI, hypertension, OSA, parkinson's disease, osteoarthritis, breast cancer, prediabetes who presents today for follow up of pneumonia.   She presented to [facility] on 12/07/20 for a one week history of cough. She had a negative Covid-19 test earlier. Chest xray revealed bilateral pneumonia so she was prescribed albuterol, Levaquin, Tessalon Perles, cefpodoxime. She was advised to repeat her chest xray in 4 weeks.  Today she's doing better. She denies fevers, shortness of breath. She completed her course of Levaqin, she did well with albuterol inhaler.   Review of Systems  Constitutional: Negative for fever.  Respiratory: Negative for shortness of breath.   Cardiovascular: Negative for chest pain.  Neurological: Negative for headaches.         Past Medical History:  Diagnosis Date  . Anxiety   . Atrial fibrillation ([place])   . Chronic diastolic CHF (congestive heart failure) ([place])    a. echo 09/2014: EF 24-09%, diastolic dysfunction, mild LVH, nl RV size & systolic function, mildly dilated LA (4.3 cm), mild MR/TR, mildly elevated PASP 36.7 mm Hg  . DDD (degenerative disc disease), cervical   . DDD (degenerative disc disease), lumbar   . Depression   . Diffuse cystic mastopathy 2014  . Dyspnea   . GERD (gastroesophageal reflux disease)   . Gout   . Headache    rare  . HLD (hyperlipidemia)    a. statin intolerant 2/2 myalgias  . Hx of dysplastic nevus 12/25/2018   L medial ankle  . Hypercholesterolemia   . Hypertension   . Malignant neoplasm of upper-outer quadrant of female breast ([place]) 10/2012   Papillary DCIS, sentinel node negative. DR/PR positive. PARTIAL RIGHT MASTECTOMY FOR BREAST CANCER--HAD RADIATION - NO CHEMO --DR. [place] ONCOLOGIST  . Obesity   . OSA on CPAP   .  Osteoarthritis of both knees    a. s/p right TKA 04/2013 & left TKA 09/2014  . Otitis externa   . Personal history of radiation therapy 2015   RIGHT breast-mammosite per pt  . Sleep difficulties    LUNESTA HAS HELPED  . Vaginal cyst     Social History   Socioeconomic History  . Marital status: Widowed    Spouse name: Chrissie Noa  . Number of children: 2  . Years of education: College  . Highest education level: Bachelor's degree (e.g., BA, AB, BS)  Occupational History  . Occupation: Retired  Tobacco Use  . Smoking status: Never Smoker  . Smokeless tobacco: Never Used  . Tobacco comment: social smoker as a teen  Media planner  . Vaping Use: Never used  Substance and Sexual Activity  . Alcohol use: No  . Drug use: No  . Sexual activity: Not Currently    Birth control/protection: Post-menopausal, Surgical  Other Topics Concern  . Not on file  Social History Narrative   Marital status: widowed since 09/16/2015      Children: 2 children (30, 69); 5 grandchild (4 in [place]; 1 in [place]).      Lives: alone in townhome; 1 dog, 2 cats      Employment: psychiatric Education officer, museum; retired in 2015      Tobacco: teenager only      Alcohol:  None      Drugs: none  Exercise: walking in 2019; more active in 2019.  Walking daily small amounts.        ADLs: drives; independent with ADLs; no assistant devices      Advanced Directives: none; FULL CODE; no prolonged measures.   Does not need them.  Daughter/Heather Vista Deck is HCPOA.    Social Determinants of Health   Financial Resource Strain: High Risk  . Difficulty of Paying Living Expenses: Hard  Food Insecurity: Not on file  Transportation Needs: Not on file  Physical Activity: Not on file  Stress: Not on file  Social Connections: Not on file  Intimate Partner Violence: Not on file    Past Surgical History:  Procedure Laterality Date  . ABDOMINAL HYSTERECTOMY  1992   DUB; fibroids; endometriosis.  One remaining ovary.    [name] BREAST  LUMPECTOMY Right 2015   Papillary DCIS, sentinel node negative. DR/PR positive. PARTIAL RIGHT MASTECTOMY FOR BREAST CANCER--HAD RADIATION - NO CHEMO --DR. [place] ONCOLOGIST  . BREAST SURGERY Right March 2014   Wide excision,APB RT 10 mm papillary DCIS, ER/PR positive. Sentinel node negative. Partial breast radiation.  [name] CATARACT EXTRACTION, BILATERAL  02/13/2016   [place].  . CHOLECYSTECTOMY  1994  . COLONOSCOPY  2015   1 benign polyp-every 5 years/ Dr Candace Cruise  . ERCP  1995  . JOINT REPLACEMENT Right Sept 2014   knee  . PERICARDIOCENTESIS N/A 11/13/2017   Procedure: PERICARDIOCENTESIS;  Surgeon: Nelva Bush, MD;  Location: [facility];  Service: Cardiovascular;  Laterality: N/A;  . TOTAL KNEE ARTHROPLASTY Right 04/16/2013   Procedure: RIGHT TOTAL KNEE ARTHROPLASTY;  Surgeon: Mauri Pole, MD;  Location: [facility];  Service: Orthopedics;  Laterality: Right;  . TOTAL KNEE ARTHROPLASTY Left 09/15/2014   Procedure: LEFT TOTAL KNEE ARTHROPLASTY;  Surgeon: Mauri Pole, MD;  Location: [facility];  Service: Orthopedics;  Laterality: Left;  . TUBAL LIGATION  1979    Family History  Problem Relation Age of Onset  . Colon cancer Mother   . Cancer Mother 38       colon cancer  . Melanoma Father   . Cancer Father 4       melanoma  . Colon cancer Maternal Grandfather   . Breast cancer Maternal Grandfather   . Melanoma Sister   . Melanoma Sister     Allergies  Allergen Reactions  . Penicillins Anaphylaxis    anaphylaxis  Has patient had a PCN reaction causing immediate rash, facial/tongue/throat swelling, SOB or lightheadedness with hypotension: Yes Has patient had a PCN reaction causing severe rash involving mucus membranes or skin necrosis: No Has patient had a PCN reaction that required hospitalization: No Has patient had a PCN reaction occurring within the last 10 years: No If all of the above answers are "NO", then may proceed with Cephalosporin use.   . Sulfa  Antibiotics Anaphylaxis  . Codeine Other (See Comments) and Nausea And Vomiting    Gi problems   . Statins Other (See Comments)    Leg pains  . Aspirin Other (See Comments)    "burned my stomach intensely" Abdominal pain and burning    Current Outpatient Medications on File Prior to Visit  Medication Sig Dispense Refill  . albuterol (VENTOLIN HFA) 108 (90 Base) MCG/ACT inhaler Inhale 2 puffs into the lungs every 4 (four) hours as needed.    . bisacodyl (DULCOLAX) 5 MG EC tablet Take two tablets morning and two tablets afternoon day prior to Miralax prep.    [name]  carbidopa-levodopa (SINEMET IR) 25-100 MG tablet Take by mouth. Take 2 tablets at breakfast, 1 tab at lunch, 1 in the afternoon and 1 at night.    . diltiazem (CARDIZEM CD) 180 MG 24 hr capsule TAKE ONE TABLET BY MOUTH EVERY MORNING 90 capsule 2  . DULoxetine (CYMBALTA) 60 MG capsule TAKE 1 CAPSULE BY MOUTH 2 TIMES A DAY for anxiety and depression. 180 capsule 1  . fluticasone (FLONASE) 50 MCG/ACT nasal spray Place 2 sprays into both nostrils daily as needed for rhinitis or allergies. 16 g 5  . furosemide (LASIX) 20 MG tablet TAKE ONE TABLET BY MOUTH EVERY MORNING 15 tablet 6  . gabapentin (NEURONTIN) 100 MG capsule TAKE 1 CAPSULE BY MOUTH THREE TIMES A DAY for anxiety. 270 capsule 1  . gabapentin (NEURONTIN) 300 MG capsule Take by mouth.    . hydrocortisone 2.5 % cream Apply to affected under breasts 1-2 times a day as directed and as needed for itch. Can also use on face as needed for itch 28 g 3  . ketoconazole (NIZORAL) 2 % cream Apply to affected areas under breast twice daily for rash until healed. Can also use on face as needed for dermatitis 30 g 5  . metoCLOPramide (REGLAN) 5 MG tablet Take one tablet 30 minutes prior to colonoscopy prep. May repeat in 4 hours if needed.    . metoprolol tartrate (LOPRESSOR) 25 MG tablet TAKE 3 TABLETS BY MOUTH 2 TIMES DAILY. 235 tablet 3  . metroNIDAZOLE (METROCREAM) 0.75 % cream Apply  topically 2 (two) times daily. Apply to red areas of face 45 g 3  . polyethylene glycol powder (GLYCOLAX/MIRALAX) 17 GM/SCOOP powder One bottle for colonoscopy prep. Use as directed.    . potassium chloride SA (KLOR-CON) 20 MEQ tablet TAKE ONE TABLET BY MOUTH EVERY MORNING 90 tablet 2  . sotalol (BETAPACE) 120 MG tablet TAKE ONE TABLET BY MOUTH EVERY MORNING and TAKE ONE TABLET BY MOUTH EVERY EVENING 60 tablet 3  . tiZANidine (ZANAFLEX) 4 MG tablet TAKE 2 TABLET (4 MG TOTAL) BY MOUTH DAILY FOR MUSCLE SPASMS AT BEDTIME 180 tablet 0  . ezetimibe (ZETIA) 10 MG tablet Take 1 tablet (10 mg total) by mouth daily. 90 tablet 3   No current facility-administered medications on file prior to visit.    BP 128/74   Pulse 64   Temp 97.8 F (36.6 C) (Temporal)   Ht 5' 0.5" (1.537 m)   Wt 177 lb (80.3 kg)   SpO2 91%   BMI 34.00 kg/m  Objective:   Physical Exam Constitutional:      Appearance: She is not ill-appearing.  Cardiovascular:     Rate and Rhythm: Normal rate and regular rhythm.  Pulmonary:     Effort: Pulmonary effort is normal.     Breath sounds: Normal breath sounds. No wheezing or rhonchi.  Musculoskeletal:     Cervical back: Neck supple.  Skin:    General: Skin is warm and dry.           Assessment & Plan:      This visit occurred during the SARS-CoV-2 public health emergency.  Safety protocols were in place, including screening questions prior to the visit, additional usage of staff PPE, and extensive cleaning of exam room while observing appropriate contact time as indicated for disinfecting solutions.

## 2021-01-08 NOTE — Assessment & Plan Note (Signed)
Diagnosed on 12/07/20, completed Levaquin course without problems.  Lungs sound clear today. She appears well. Repeat chest xray pending.

## 2021-01-12 DIAGNOSIS — I110 Hypertensive heart disease with heart failure: Secondary | ICD-10-CM | POA: Diagnosis not present

## 2021-01-12 DIAGNOSIS — J189 Pneumonia, unspecified organism: Secondary | ICD-10-CM | POA: Diagnosis not present

## 2021-01-12 DIAGNOSIS — I509 Heart failure, unspecified: Secondary | ICD-10-CM | POA: Diagnosis not present

## 2021-01-12 DIAGNOSIS — Z9181 History of falling: Secondary | ICD-10-CM | POA: Diagnosis not present

## 2021-01-12 DIAGNOSIS — G20 Parkinson's disease: Secondary | ICD-10-CM | POA: Diagnosis not present

## 2021-01-12 DIAGNOSIS — E785 Hyperlipidemia, unspecified: Secondary | ICD-10-CM | POA: Diagnosis not present

## 2021-01-13 ENCOUNTER — Telehealth: Payer: Self-pay | Admitting: Primary Care

## 2021-01-13 DIAGNOSIS — J189 Pneumonia, unspecified organism: Secondary | ICD-10-CM | POA: Diagnosis not present

## 2021-01-13 DIAGNOSIS — I110 Hypertensive heart disease with heart failure: Secondary | ICD-10-CM | POA: Diagnosis not present

## 2021-01-13 DIAGNOSIS — I509 Heart failure, unspecified: Secondary | ICD-10-CM | POA: Diagnosis not present

## 2021-01-13 DIAGNOSIS — G20 Parkinson's disease: Secondary | ICD-10-CM | POA: Diagnosis not present

## 2021-01-13 DIAGNOSIS — E785 Hyperlipidemia, unspecified: Secondary | ICD-10-CM | POA: Diagnosis not present

## 2021-01-13 DIAGNOSIS — Z9181 History of falling: Secondary | ICD-10-CM | POA: Diagnosis not present

## 2021-01-13 NOTE — Telephone Encounter (Signed)
Selena Bush from [facility] and sent over fax on  3/23 5/13. Fax were  Order receiving home health aide and plan of care certification.

## 2021-01-14 NOTE — Telephone Encounter (Signed)
I'm not sure if I have received or not, I don't have recollection. Can you check my inbox?

## 2021-01-18 DIAGNOSIS — I509 Heart failure, unspecified: Secondary | ICD-10-CM | POA: Diagnosis not present

## 2021-01-18 DIAGNOSIS — G20 Parkinson's disease: Secondary | ICD-10-CM | POA: Diagnosis not present

## 2021-01-18 DIAGNOSIS — I110 Hypertensive heart disease with heart failure: Secondary | ICD-10-CM | POA: Diagnosis not present

## 2021-01-18 DIAGNOSIS — E785 Hyperlipidemia, unspecified: Secondary | ICD-10-CM | POA: Diagnosis not present

## 2021-01-18 DIAGNOSIS — Z9181 History of falling: Secondary | ICD-10-CM | POA: Diagnosis not present

## 2021-01-18 DIAGNOSIS — J189 Pneumonia, unspecified organism: Secondary | ICD-10-CM | POA: Diagnosis not present

## 2021-01-19 DIAGNOSIS — I509 Heart failure, unspecified: Secondary | ICD-10-CM | POA: Diagnosis not present

## 2021-01-19 DIAGNOSIS — I110 Hypertensive heart disease with heart failure: Secondary | ICD-10-CM | POA: Diagnosis not present

## 2021-01-19 DIAGNOSIS — E785 Hyperlipidemia, unspecified: Secondary | ICD-10-CM | POA: Diagnosis not present

## 2021-01-19 DIAGNOSIS — J189 Pneumonia, unspecified organism: Secondary | ICD-10-CM | POA: Diagnosis not present

## 2021-01-19 DIAGNOSIS — G20 Parkinson's disease: Secondary | ICD-10-CM | POA: Diagnosis not present

## 2021-01-19 DIAGNOSIS — Z9181 History of falling: Secondary | ICD-10-CM | POA: Diagnosis not present

## 2021-01-20 NOTE — Telephone Encounter (Signed)
Called spoke to [place] there are several patients that she does not have orders for. I have asked her to e-mail me a list of names and order numbers and I will be happy to look into it for her.  She will do so now and I will follow up with new message in chart for patient for any that we do not have or need. No further action needed at this time for this patient.

## 2021-01-21 DIAGNOSIS — G20 Parkinson's disease: Secondary | ICD-10-CM | POA: Diagnosis not present

## 2021-01-21 DIAGNOSIS — I110 Hypertensive heart disease with heart failure: Secondary | ICD-10-CM | POA: Diagnosis not present

## 2021-01-21 DIAGNOSIS — J189 Pneumonia, unspecified organism: Secondary | ICD-10-CM | POA: Diagnosis not present

## 2021-01-21 DIAGNOSIS — I509 Heart failure, unspecified: Secondary | ICD-10-CM | POA: Diagnosis not present

## 2021-01-21 DIAGNOSIS — E785 Hyperlipidemia, unspecified: Secondary | ICD-10-CM | POA: Diagnosis not present

## 2021-01-21 DIAGNOSIS — Z9181 History of falling: Secondary | ICD-10-CM | POA: Diagnosis not present

## 2021-01-24 ENCOUNTER — Other Ambulatory Visit: Payer: Self-pay | Admitting: Cardiology

## 2021-01-25 DIAGNOSIS — G20 Parkinson's disease: Secondary | ICD-10-CM | POA: Diagnosis not present

## 2021-01-25 DIAGNOSIS — E785 Hyperlipidemia, unspecified: Secondary | ICD-10-CM | POA: Diagnosis not present

## 2021-01-25 DIAGNOSIS — J189 Pneumonia, unspecified organism: Secondary | ICD-10-CM | POA: Diagnosis not present

## 2021-01-25 DIAGNOSIS — I509 Heart failure, unspecified: Secondary | ICD-10-CM | POA: Diagnosis not present

## 2021-01-25 DIAGNOSIS — Z9181 History of falling: Secondary | ICD-10-CM | POA: Diagnosis not present

## 2021-01-25 DIAGNOSIS — I110 Hypertensive heart disease with heart failure: Secondary | ICD-10-CM | POA: Diagnosis not present

## 2021-01-25 NOTE — Patient Instructions (Signed)
Dear Selena Bush,  Below is a summary of the goals we discussed during our follow up appointment on Dec 24, 2020. Please contact me anytime with questions or concerns.   Visit Information  Patient Care Plan: CCM Pharmacy Care Plan     Problem Identified: CHL AMB "PATIENT-SPECIFIC PROBLEM"      Long-Range Goal: Disease Management   Start Date: 12/24/2020  Priority: High  Note:   Current Barriers:  None identified  Pharmacist Clinical Goal(s):  Patient will contact provider office for questions/concerns as evidenced notation of same in electronic health record through collaboration with PharmD and provider.   Interventions: 1:1 collaboration with Pleas Koch, NP regarding development and update of comprehensive plan of care as evidenced by provider attestation and co-signature Inter-disciplinary care team collaboration (see longitudinal plan of care) Comprehensive medication review performed; medication list updated in electronic medical record  Hypertension (BP goal <140/90) -Controlled - per clinic and home readings  -Current treatment: Metoprolol tartrate 25 mg - 2 tablets twice daily Furosemide 40 mg - one-half tablet daily Diltiazem 180 mg - 1 capsule daily -Medications previously tried: Lisinopril-HCTZ (stopped after metoprolol and sotalol were added for AFIB) -Current home readings: PT and OT complete vital signs when they visit once weekly. Pt reports BP and HR have been good. Reports BP and HR always good. Oxygen good.  -Denies hypotensive/hypertensive symptoms -Educated on Symptoms of hypotension and importance of maintaining adequate hydration; -Recommended to continue current medication  Hyperlipidemia: (LDL goal < 100) -Not ideally controlled - LDL 132 - due for updated lipid panel. Panel has not been updated since she restarted Zetia 06/2020. Zetia started per cardiology. -Current treatment: OTC fish oil - 2 capsules daily  Zetia 10 mg - 1 daily  (restarted 06/2020) -Medications previously tried: statin intolerant  -Educated on Cholesterol goals;  Benefits of statin for ASCVD risk reduction; -Recommended to continue current medication  Pre-Diabetes (A1c goal <7%) -Controlled - A1c 5.9% -Current medications: None -Medications previously tried: none reported  -Current home glucose readings - none  -Denies hypoglycemic/hyperglycemic symptoms -Counseled to check feet daily and get yearly eye exams -Recommended continue lifestyle management  Heart Failure (Goal: manage symptoms and prevent exacerbations) -Controlled - per patient report of no symptoms  -Last ejection fraction: 65-70% (Date: 05/2018) -HF type: Diastolic -Current treatment: Furosemide 20 mg - 1 every morning -Denies any swelling or shortness of breath. -Medications previously tried: none reported  -Current home BP/HR readings: within goal  -Educated on Importance of weighing daily; if you gain more than 3 pounds in one day or 5 pounds in one week, contact your doctor  -Recommended to continue current medication   Patient Goals/Self-Care Activities Patient will:  - contact provider office with any health concerns        The patient verbalized understanding of instructions, educational materials, and care plan provided today and agreed to receive a mailed copy of patient instructions, educational materials, and care plan.  The pharmacy team will reach out to the patient again over the next 30 days.   [name], PharmD Clinical Pharmacist [facility] at [facility] [phone]

## 2021-01-28 ENCOUNTER — Telehealth: Payer: Self-pay

## 2021-01-28 NOTE — Chronic Care Management (AMB) (Addendum)
Chronic Care Management Pharmacy Assistant   Name: Selena Bush  MRN: [id number] DOB: Feb 28, 1946   Reason for Encounter: Medication Adherence and Delivery Coordination   Recent office visits:  01/08/21 - PCP Selena Friendly NP  Recent consult visits:  01/05/21 - Dermatology continue Ketoconazole 2% shampoo, continue hydrocortisone 2.5% cream apply to face prn itch start metronidazole 0.75% Apply 2 times daily to red areas of face  Hospital visits:  None in previous 6 months  Medications: Outpatient Encounter Medications as of 01/28/2021  Medication Sig   albuterol (VENTOLIN HFA) 108 (90 Base) MCG/ACT inhaler Inhale 2 puffs into the lungs every 4 (four) hours as needed.   bisacodyl (DULCOLAX) 5 MG EC tablet Take two tablets morning and two tablets afternoon day prior to Miralax prep.   carbidopa-levodopa (SINEMET IR) 25-100 MG tablet Take by mouth. Take 2 tablets at breakfast, 1 tab at lunch, 1 in the afternoon and 1 at night.   diltiazem (CARDIZEM CD) 180 MG 24 hr capsule TAKE ONE TABLET BY MOUTH EVERY MORNING   DULoxetine (CYMBALTA) 60 MG capsule TAKE 1 CAPSULE BY MOUTH 2 TIMES A DAY for anxiety and depression.   ezetimibe (ZETIA) 10 MG tablet Take 1 tablet (10 mg total) by mouth daily.   fluticasone (FLONASE) 50 MCG/ACT nasal spray Place 2 sprays into both nostrils daily as needed for rhinitis or allergies.   furosemide (LASIX) 20 MG tablet TAKE ONE TABLET BY MOUTH EVERY MORNING   gabapentin (NEURONTIN) 100 MG capsule TAKE 1 CAPSULE BY MOUTH THREE TIMES A DAY for anxiety.   gabapentin (NEURONTIN) 300 MG capsule Take 600 mg by mouth at bedtime.   hydrocortisone 2.5 % cream Apply to affected under breasts 1-2 times a day as directed and as needed for itch. Can also use on face as needed for itch   ketoconazole (NIZORAL) 2 % cream Apply to affected areas under breast twice daily for rash until healed. Can also use on face as needed for dermatitis   metoCLOPramide (REGLAN) 5 MG  tablet Take one tablet 30 minutes prior to colonoscopy prep. May repeat in 4 hours if needed.   metoprolol tartrate (LOPRESSOR) 25 MG tablet TAKE THREE TABLETS BY MOUTH EVERY MORNING and TAKE THREE TABLETS BY MOUTH EVERY EVENING   metroNIDAZOLE (METROCREAM) 0.75 % cream Apply topically 2 (two) times daily. Apply to red areas of face   polyethylene glycol powder (GLYCOLAX/MIRALAX) 17 GM/SCOOP powder One bottle for colonoscopy prep. Use as directed.   potassium chloride SA (KLOR-CON) 20 MEQ tablet TAKE ONE TABLET BY MOUTH EVERY MORNING   sotalol (BETAPACE) 120 MG tablet TAKE ONE TABLET BY MOUTH EVERY MORNING and TAKE ONE TABLET BY MOUTH EVERY EVENING   tiZANidine (ZANAFLEX) 4 MG tablet TAKE 2 TABLET (4 MG TOTAL) BY MOUTH DAILY FOR MUSCLE SPASMS AT BEDTIME   No facility-administered encounter medications on file as of 01/28/2021.   BP Readings from Last 3 Encounters:  01/08/21 128/74  12/14/20 118/80  10/09/20 (!) 153/102    Lab Results  Component Value Date   HGBA1C 5.9 06/01/2020     Patient obtains medications through Adherence Packaging  30 Days   Last adherence delivery date: 01/07/2021     Patient is due for next adherence delivery on: 02/05/2021  Spoke with patient on 01/28/2021 and reviewed medications and coordinated delivery.  This delivery to include: Adherence Packaging  30 Days  Packs: Furosemide 20mg  1 tablet daily (breakfast) Ezetimibe 10mg  1 tablet daily (breakfast) Diltiazem 180mg  1  tablet daily (breakfast) Potassium chloride 12meq 1 tablet daily (breakfast) Sotalol 120mg  1 tablet twice daily (1 breakfast and 1 evening meal) Gabapentin 100mg  1 capsule three times a day (1 breakfast, 1 evening meal, 1 bedtime)  Gabapentin 300mg  2 caps at (2 bedtime) Metoprolol 25mg  3 tablets twice a day (3 breakfast and 3 evening meal) Duloxentine 60mg  1 tablet twice a day (1 breakfast, 1 evening meal) Tizanidine 4mg  2 tablets daily (2 bedtime) PRN/VIAL  medications: Carbidopa-levodopa 25mg -100 mg  6 am: 2 tabs / 9 am: 1 tab/ 12 pm: 1 tab/ 3 pm: 1 tab/ 6 pm: 1 tab/ 9 pm: 2 tabs  Patient declined the following medications this month: Fluticasone Propionate 50 mcg Place 2 sprays into both nostrils daily  - PRN Ketoconazole Shampoo 2%-  uses PRN Hydrocortisone 2.5% - uses PRN  No refill request needed from PCP.  Confirmed delivery date of 02/05/2021, advised patient that pharmacy will contact her the morning of delivery.  Patient reports she does not have monitors in her home to check her BG's or BP's however, she goes to PT and OT on a regular basis and they will check routinely. Her vitals which have been within normal limits.  Follow-Up:  Coordination of Enhanced Pharmacy Services and Pharmacist Review  [name], CPP notified  [name], [place] Assistant [phone]  I have reviewed the care management and care coordination activities outlined in this encounter and I am certifying that I agree with the content of this note. No further action required.  [name], PharmD Clinical Pharmacist [facility] at [facility] [phone]

## 2021-02-01 DIAGNOSIS — J189 Pneumonia, unspecified organism: Secondary | ICD-10-CM | POA: Diagnosis not present

## 2021-02-01 DIAGNOSIS — G20 Parkinson's disease: Secondary | ICD-10-CM | POA: Diagnosis not present

## 2021-02-01 DIAGNOSIS — I110 Hypertensive heart disease with heart failure: Secondary | ICD-10-CM | POA: Diagnosis not present

## 2021-02-01 DIAGNOSIS — Z9181 History of falling: Secondary | ICD-10-CM | POA: Diagnosis not present

## 2021-02-01 DIAGNOSIS — E785 Hyperlipidemia, unspecified: Secondary | ICD-10-CM | POA: Diagnosis not present

## 2021-02-01 DIAGNOSIS — I509 Heart failure, unspecified: Secondary | ICD-10-CM | POA: Diagnosis not present

## 2021-02-02 DIAGNOSIS — Z9181 History of falling: Secondary | ICD-10-CM | POA: Diagnosis not present

## 2021-02-02 DIAGNOSIS — I509 Heart failure, unspecified: Secondary | ICD-10-CM | POA: Diagnosis not present

## 2021-02-02 DIAGNOSIS — E785 Hyperlipidemia, unspecified: Secondary | ICD-10-CM | POA: Diagnosis not present

## 2021-02-02 DIAGNOSIS — G20 Parkinson's disease: Secondary | ICD-10-CM | POA: Diagnosis not present

## 2021-02-02 DIAGNOSIS — I110 Hypertensive heart disease with heart failure: Secondary | ICD-10-CM | POA: Diagnosis not present

## 2021-02-02 DIAGNOSIS — J189 Pneumonia, unspecified organism: Secondary | ICD-10-CM | POA: Diagnosis not present

## 2021-02-03 ENCOUNTER — Other Ambulatory Visit: Payer: Self-pay

## 2021-02-03 DIAGNOSIS — F4312 Post-traumatic stress disorder, chronic: Secondary | ICD-10-CM | POA: Diagnosis not present

## 2021-02-03 DIAGNOSIS — M62838 Other muscle spasm: Secondary | ICD-10-CM

## 2021-02-03 DIAGNOSIS — R69 Illness, unspecified: Secondary | ICD-10-CM | POA: Diagnosis not present

## 2021-02-03 NOTE — Telephone Encounter (Signed)
LAST APPOINTMENT DATE: 01/08/2021   NEXT APPOINTMENT DATE: Visit date not found    LAST REFILL: 10/29/20

## 2021-02-04 MED ORDER — TIZANIDINE HCL 4 MG PO TABS: ORAL_TABLET | ORAL | 0 refills | Status: DC

## 2021-02-08 DIAGNOSIS — Z9181 History of falling: Secondary | ICD-10-CM | POA: Diagnosis not present

## 2021-02-08 DIAGNOSIS — E785 Hyperlipidemia, unspecified: Secondary | ICD-10-CM | POA: Diagnosis not present

## 2021-02-08 DIAGNOSIS — I509 Heart failure, unspecified: Secondary | ICD-10-CM | POA: Diagnosis not present

## 2021-02-08 DIAGNOSIS — J189 Pneumonia, unspecified organism: Secondary | ICD-10-CM | POA: Diagnosis not present

## 2021-02-08 DIAGNOSIS — G20 Parkinson's disease: Secondary | ICD-10-CM | POA: Diagnosis not present

## 2021-02-08 DIAGNOSIS — I110 Hypertensive heart disease with heart failure: Secondary | ICD-10-CM | POA: Diagnosis not present

## 2021-02-09 ENCOUNTER — Encounter: Payer: Self-pay | Admitting: General Surgery

## 2021-02-10 ENCOUNTER — Other Ambulatory Visit: Payer: Self-pay

## 2021-02-10 ENCOUNTER — Ambulatory Visit
Admission: RE | Admit: 2021-02-10 | Discharge: 2021-02-10 | Disposition: A | Discharge status: Home / Self Care | Payer: Medicare HMO | Attending: General Surgery | Admitting: General Surgery

## 2021-02-10 ENCOUNTER — Encounter
Admission: RE | Disposition: A | Discharge status: Home / Self Care | Payer: Self-pay | Source: Home / Self Care | Attending: General Surgery

## 2021-02-10 ENCOUNTER — Encounter: Payer: Self-pay | Admitting: General Surgery

## 2021-02-10 ENCOUNTER — Ambulatory Visit: Payer: Medicare HMO | Admitting: Anesthesiology

## 2021-02-10 DIAGNOSIS — Z880 Allergy status to penicillin: Secondary | ICD-10-CM | POA: Diagnosis not present

## 2021-02-10 DIAGNOSIS — Z79899 Other long term (current) drug therapy: Secondary | ICD-10-CM | POA: Diagnosis not present

## 2021-02-10 DIAGNOSIS — E669 Obesity, unspecified: Secondary | ICD-10-CM | POA: Diagnosis not present

## 2021-02-10 DIAGNOSIS — Z87891 Personal history of nicotine dependence: Secondary | ICD-10-CM | POA: Insufficient documentation

## 2021-02-10 DIAGNOSIS — K5730 Diverticulosis of large intestine without perforation or abscess without bleeding: Secondary | ICD-10-CM | POA: Diagnosis not present

## 2021-02-10 DIAGNOSIS — Z888 Allergy status to other drugs, medicaments and biological substances status: Secondary | ICD-10-CM | POA: Insufficient documentation

## 2021-02-10 DIAGNOSIS — Z6833 Body mass index (BMI) 33.0-33.9, adult: Secondary | ICD-10-CM | POA: Diagnosis not present

## 2021-02-10 DIAGNOSIS — Z8719 Personal history of other diseases of the digestive system: Secondary | ICD-10-CM | POA: Diagnosis not present

## 2021-02-10 DIAGNOSIS — Z882 Allergy status to sulfonamides status: Secondary | ICD-10-CM | POA: Insufficient documentation

## 2021-02-10 DIAGNOSIS — Z1211 Encounter for screening for malignant neoplasm of colon: Secondary | ICD-10-CM | POA: Insufficient documentation

## 2021-02-10 DIAGNOSIS — K5732 Diverticulitis of large intestine without perforation or abscess without bleeding: Secondary | ICD-10-CM | POA: Diagnosis not present

## 2021-02-10 DIAGNOSIS — Z96653 Presence of artificial knee joint, bilateral: Secondary | ICD-10-CM | POA: Diagnosis not present

## 2021-02-10 DIAGNOSIS — G4733 Obstructive sleep apnea (adult) (pediatric): Secondary | ICD-10-CM | POA: Diagnosis not present

## 2021-02-10 DIAGNOSIS — Z886 Allergy status to analgesic agent status: Secondary | ICD-10-CM | POA: Diagnosis not present

## 2021-02-10 DIAGNOSIS — Z800 Family history of malignant neoplasm of digestive organs: Secondary | ICD-10-CM | POA: Insufficient documentation

## 2021-02-10 DIAGNOSIS — Z885 Allergy status to narcotic agent status: Secondary | ICD-10-CM | POA: Diagnosis not present

## 2021-02-10 HISTORY — PX: COLONOSCOPY WITH PROPOFOL: SHX5780

## 2021-02-10 HISTORY — DX: Cardiac arrhythmia, unspecified: I49.9

## 2021-02-10 SURGERY — COLONOSCOPY WITH PROPOFOL
Anesthesia: General

## 2021-02-10 MED ORDER — PROPOFOL 500 MG/50ML IV EMUL
INTRAVENOUS | Status: AC
  Filled 2021-02-10: qty 50

## 2021-02-10 MED ORDER — SODIUM CHLORIDE 0.9 % IV SOLN: INTRAVENOUS | Status: DC

## 2021-02-10 MED ORDER — PROPOFOL 500 MG/50ML IV EMUL
INTRAVENOUS | Status: DC | PRN
  Administered 2021-02-10: 150 ug/kg/min via INTRAVENOUS

## 2021-02-10 MED ORDER — PROPOFOL 10 MG/ML IV BOLUS
INTRAVENOUS | Status: DC | PRN
  Administered 2021-02-10: 70 mg via INTRAVENOUS

## 2021-02-10 NOTE — Op Note (Signed)
[facility] Patient Name: Remi Rester Procedure Date: 02/10/2021 8:16 AM MRN: [id number] Account #: [id number] Date of Birth: 15-Jun-1946 Admit Type: Outpatient Age: 75 Room: [facility] ENDO ROOM 1 Gender: Female Note Status: Finalized Procedure:             Colonoscopy Indications:           Screening for colon cancer: Family history of                         colorectal cancer in distant relative(s) Providers:             Robert Bellow, MD Medicines:             Propofol per Anesthesia Complications:         No immediate complications. Procedure:             Pre-Anesthesia Assessment:                        - Prior to the procedure, a History and Physical was                         performed, and patient medications, allergies and                         sensitivities were reviewed. The patient's tolerance                         of previous anesthesia was reviewed.                        - The risks and benefits of the procedure and the                         sedation options and risks were discussed with the                         patient. All questions were answered and informed                         consent was obtained.                        After obtaining informed consent, the colonoscope was                         passed under direct vision. Throughout the procedure,                         the patient's blood pressure, pulse, and oxygen                         saturations were monitored continuously. The                         Colonoscope was introduced through the anus and                         advanced to the the cecum, identified by appendiceal  orifice and ileocecal valve. The colonoscopy was                         somewhat difficult due to multiple diverticula in the                         colon. The patient tolerated the procedure well. The                         quality of the bowel preparation  was excellent. Findings:      Multiple medium-mouthed diverticula were found in the sigmoid colon.      The retroflexed view of the distal rectum and anal verge was normal and       showed no anal or rectal abnormalities. Impression:            - Diverticulosis in the sigmoid colon.                        - The distal rectum and anal verge are normal on                         retroflexion view.                        - No specimens collected. Recommendation:        - Discharge patient to home (via wheelchair). Procedure Code(s):     --- Professional ---                        [phone], Colonoscopy, flexible; diagnostic, including                         collection of specimen(s) by brushing or washing, when                         performed (separate procedure) Diagnosis Code(s):     --- Professional ---                        Z12.11, Encounter for screening for malignant neoplasm                         of colon                        Z80.0, Family history of malignant neoplasm of                         digestive organs                        K57.30, Diverticulosis of large intestine without                         perforation or abscess without bleeding CPT copyright 2019 [employer] Association. All rights reserved. The codes documented in this report are preliminary and upon coder review may  be revised to meet current compliance requirements. Robert Bellow, MD 02/10/2021 8:49:12 AM This report has been signed electronically. Number of Addenda: 0 Note Initiated On: 02/10/2021 8:16 AM Scope Withdrawal Time: 0 hours  12 minutes 8 seconds  Total Procedure Duration: 0 hours 23 minutes 20 seconds       [facility]

## 2021-02-10 NOTE — Anesthesia Preprocedure Evaluation (Signed)
Anesthesia Evaluation  Patient identified by MRN, date of birth, ID band Patient awake    Reviewed: Allergy & Precautions, NPO status , Patient's Chart, lab work & pertinent test results  History of Anesthesia Complications Negative for: history of anesthetic complications  Airway Mallampati: III  TM Distance: <3 FB Neck ROM: full    Dental  (+) Chipped   Pulmonary sleep apnea ,    Pulmonary exam normal        Cardiovascular Exercise Tolerance: Good hypertension, (-) angina+CHF  Normal cardiovascular exam+ dysrhythmias      Neuro/Psych  Headaches, PSYCHIATRIC DISORDERS    GI/Hepatic Neg liver ROS, GERD  Medicated and Controlled,  Endo/Other  negative endocrine ROS  Renal/GU negative Renal ROS  negative genitourinary   Musculoskeletal  (+) Arthritis ,   Abdominal   Peds  Hematology negative hematology ROS (+)   Anesthesia Other Findings Past Medical History: No date: Anxiety No date: Atrial fibrillation (HCC) No date: Chronic diastolic CHF (congestive heart failure) (HCC)     Comment:  a. echo 09/2014: EF 35-32%, diastolic dysfunction, mild               LVH, nl RV size & systolic function, mildly dilated LA               (4.3 cm), mild MR/TR, mildly elevated PASP 36.7 mm Hg No date: DDD (degenerative disc disease), cervical No date: DDD (degenerative disc disease), lumbar No date: Depression 2014: Diffuse cystic mastopathy No date: Dyspnea No date: Dysrhythmia No date: GERD (gastroesophageal reflux disease) No date: Gout No date: Headache     Comment:  rare No date: HLD (hyperlipidemia)     Comment:  a. 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 DR/PR positive.                PARTIAL RIGHT MASTECTOMY FOR BREAST CANCER--HAD RADIATION              - NO CHEMO --DR. CRYSTAL [place] ONCOLOGIST No date: Obesity No date: OSA on CPAP No date: Osteoarthritis of both knees     Comment:  a. s/p right TKA 04/2013 & left TKA 09/2014 No date: Otitis externa 2015: Personal history of radiation therapy     Comment:  RIGHT breast-mammosite per pt No date: Sleep difficulties     Comment:  LUNESTA HAS HELPED No date: Vaginal cyst  Past Surgical History: 1992: ABDOMINAL HYSTERECTOMY     Comment:  DUB; fibroids; endometriosis.  One remaining ovary.   2015: BREAST LUMPECTOMY; Right     Comment:  Papillary DCIS, sentinel node negative. DR/PR positive.               PARTIAL RIGHT MASTECTOMY FOR BREAST CANCER--HAD RADIATION              - NO CHEMO --DR. CRYSTAL [place] ONCOLOGIST 10/2012: BREAST SURGERY; Right     Comment:  Wide excision,APB RT 10 mm papillary DCIS, ER/PR               positive. Sentinel node negative. Partial breast               radiation. 02/13/2016: CATARACT EXTRACTION, BILATERAL     Comment:  [facility]. 1994: CHOLECYSTECTOMY 2015: COLONOSCOPY  Comment:  1 benign polyp-every 5 years/ Dr Candace Cruise 1995: ERCP 04/2013: JOINT REPLACEMENT; Right     Comment:  knee 11/13/2017: PERICARDIOCENTESIS; N/A     Comment:  Procedure: PERICARDIOCENTESIS;  Surgeon: Nelva Bush, MD;  Location: [facility];  Service:              Cardiovascular;  Laterality: N/A; 04/16/2013: TOTAL KNEE ARTHROPLASTY; Right     Comment:  Procedure: RIGHT TOTAL KNEE ARTHROPLASTY;  Surgeon:               Mauri Pole, MD;  Location: [facility];  Service:               Orthopedics;  Laterality: Right; 09/15/2014: TOTAL KNEE ARTHROPLASTY; Left     Comment:  Procedure: LEFT TOTAL KNEE ARTHROPLASTY;  Surgeon:               Mauri Pole, MD;  Location: [facility];  Service:               Orthopedics;  Laterality: Left; 1979: TUBAL LIGATION No date: UPPER GI  ENDOSCOPY  BMI    Body Mass Index: 33.20 kg/m      Reproductive/Obstetrics negative OB ROS                             Anesthesia Physical Anesthesia Plan  ASA: 3  Anesthesia Plan: General   Post-op Pain Management:    Induction: Intravenous  PONV Risk Score and Plan: Propofol infusion and TIVA  Airway Management Planned: Natural Airway and Nasal Cannula  Additional Equipment:   Intra-op Plan:   Post-operative Plan:   Informed Consent: I have reviewed the patients History and Physical, chart, labs and discussed the procedure including the risks, benefits and alternatives for the proposed anesthesia with the patient or authorized representative who has indicated his/her understanding and acceptance.     Dental Advisory Given  Plan Discussed with: Anesthesiologist, CRNA and Surgeon  Anesthesia Plan Comments: (Patient consented for risks of anesthesia including but not limited to:  - adverse reactions to medications - risk of airway placement if required - damage to eyes, teeth, lips or other oral mucosa - nerve damage due to positioning  - sore throat or hoarseness - Damage to heart, brain, nerves, lungs, other parts of body or loss of life  Patient voiced understanding.)        Anesthesia Quick Evaluation

## 2021-02-10 NOTE — H&P (Signed)
Selena Bush [id number] [birth year]     HPI:  75 y/o female for follow up colonoscopy. Inflammatory polyps in 2016. Tolerated prep without nausea and vomiting.   Medications Prior to Admission  Medication Sig Dispense Refill Last Dose   acetaminophen (TYLENOL) 500 MG tablet Take by mouth.      bisacodyl (DULCOLAX) 5 MG EC tablet Take two tablets morning and two tablets afternoon day prior to Miralax prep.   02/09/2021   carbidopa-levodopa (SINEMET IR) 25-100 MG tablet Take by mouth. Take 2 tablets at breakfast, 1 tab at lunch, 1 in the afternoon and 1 at night.   02/10/2021   diltiazem (CARDIZEM CD) 180 MG 24 hr capsule TAKE ONE TABLET BY MOUTH EVERY MORNING 90 capsule 2 02/10/2021   DULoxetine (CYMBALTA) 60 MG capsule TAKE 1 CAPSULE BY MOUTH 2 TIMES A DAY for anxiety and depression. 180 capsule 1 02/10/2021   fluticasone (FLONASE) 50 MCG/ACT nasal spray Place 2 sprays into both nostrils daily as needed for rhinitis or allergies. 16 g 5 Past Week   furosemide (LASIX) 20 MG tablet TAKE ONE TABLET BY MOUTH EVERY MORNING (Patient taking differently: 40 mg 2 (two) times daily.) 15 tablet 6 02/09/2021   gabapentin (NEURONTIN) 100 MG capsule TAKE 1 CAPSULE BY MOUTH THREE TIMES A DAY for anxiety. 270 capsule 1 02/10/2021   gabapentin (NEURONTIN) 300 MG capsule Take 600 mg by mouth at bedtime.   02/09/2021   metoCLOPramide (REGLAN) 5 MG tablet Take one tablet 30 minutes prior to colonoscopy prep. May repeat in 4 hours if needed.   02/10/2021   metoprolol tartrate (LOPRESSOR) 25 MG tablet TAKE THREE TABLETS BY MOUTH EVERY MORNING and TAKE THREE TABLETS BY MOUTH EVERY EVENING 235 tablet 3 02/10/2021   omeprazole (PRILOSEC) 40 MG capsule Take 40 mg by mouth daily.      polyethylene glycol powder (GLYCOLAX/MIRALAX) 17 GM/SCOOP powder One bottle for colonoscopy prep. Use as directed.   02/09/2021   potassium chloride (MICRO-K) 10 MEQ CR capsule Take 10 mEq by mouth 2 (two) times daily.   02/09/2021   potassium  chloride SA (KLOR-CON) 20 MEQ tablet TAKE ONE TABLET BY MOUTH EVERY MORNING 90 tablet 2 02/09/2021   sotalol (BETAPACE) 120 MG tablet TAKE ONE TABLET BY MOUTH EVERY MORNING and TAKE ONE TABLET BY MOUTH EVERY EVENING 60 tablet 3 02/10/2021   tiZANidine (ZANAFLEX) 4 MG tablet TAKE 2 TABLET (4 MG TOTAL) BY MOUTH DAILY FOR MUSCLE SPASMS AT BEDTIME 180 tablet 0 02/09/2021   albuterol (VENTOLIN HFA) 108 (90 Base) MCG/ACT inhaler Inhale 2 puffs into the lungs every 4 (four) hours as needed.      ezetimibe (ZETIA) 10 MG tablet Take 1 tablet (10 mg total) by mouth daily. 90 tablet 3    hydrocortisone 2.5 % cream Apply to affected under breasts 1-2 times a day as directed and as needed for itch. Can also use on face as needed for itch 28 g 3    ketoconazole (NIZORAL) 2 % cream Apply to affected areas under breast twice daily for rash until healed. Can also use on face as needed for dermatitis 30 g 5    metroNIDAZOLE (METROCREAM) 0.75 % cream Apply topically 2 (two) times daily. Apply to red areas of face 45 g 3    Allergies  Allergen Reactions   Penicillins Anaphylaxis    anaphylaxis  Has patient had a PCN reaction causing immediate rash, facial/tongue/throat swelling, SOB or lightheadedness with hypotension: Yes Has patient had a PCN  reaction causing severe rash involving mucus membranes or skin necrosis: No Has patient had a PCN reaction that required hospitalization: No Has patient had a PCN reaction occurring within the last 10 years: No If all of the above answers are "NO", then may proceed with Cephalosporin use.    Sulfa Antibiotics Anaphylaxis   Codeine Other (See Comments) and Nausea And Vomiting    Gi problems    Statins Other (See Comments)    Leg pains   Aspirin Other (See Comments)    "burned my stomach intensely" Abdominal pain and burning   Past Medical History:  Diagnosis Date   Anxiety    Atrial fibrillation (HCC)    Chronic diastolic CHF (congestive heart failure) ([place])    a.  echo 09/2014: EF 32-67%, diastolic dysfunction, mild LVH, nl RV size & systolic function, mildly dilated LA (4.3 cm), mild MR/TR, mildly elevated PASP 36.7 mm Hg   DDD (degenerative disc disease), cervical    DDD (degenerative disc disease), lumbar    Depression    Diffuse cystic mastopathy 2014   Dyspnea    Dysrhythmia    GERD (gastroesophageal reflux disease)    Gout    Headache    rare   HLD (hyperlipidemia)    a. statin intolerant 2/2 myalgias   Hx of dysplastic nevus 12/25/2018   L medial ankle   Hypercholesterolemia    Hypertension    Malignant neoplasm of upper-outer quadrant of female breast ([place]) 10/2012   Papillary DCIS, sentinel node negative. DR/PR positive. PARTIAL RIGHT MASTECTOMY FOR BREAST CANCER--HAD RADIATION - NO CHEMO --DR. CRYSTAL [facility] ONCOLOGIST   Obesity    OSA on CPAP    Osteoarthritis of both knees    a. s/p right TKA 04/2013 & left TKA 09/2014   Otitis externa    Personal history of radiation therapy 2015   RIGHT breast-mammosite per pt   Sleep difficulties    LUNESTA HAS HELPED   Vaginal cyst    Past Surgical History:  Procedure Laterality Date   ABDOMINAL HYSTERECTOMY  1992   DUB; fibroids; endometriosis.  One remaining ovary.     BREAST LUMPECTOMY Right 2015   Papillary DCIS, sentinel node negative. DR/PR positive. PARTIAL RIGHT MASTECTOMY FOR BREAST CANCER--HAD RADIATION - NO CHEMO --DR. CRYSTAL [place] ONCOLOGIST   BREAST SURGERY Right 10/2012   Wide excision,APB RT 10 mm papillary DCIS, ER/PR positive. Sentinel node negative. Partial breast radiation.   CATARACT EXTRACTION, BILATERAL  02/13/2016   [facility].   CHOLECYSTECTOMY  1994   COLONOSCOPY  2015   1 benign polyp-every 5 years/ Dr Candace Cruise   ERCP  1995   JOINT REPLACEMENT Right 04/2013   knee   PERICARDIOCENTESIS N/A 11/13/2017   Procedure: PERICARDIOCENTESIS;  Surgeon: Nelva Bush, MD;  Location: [facility];  Service: Cardiovascular;  Laterality: N/A;   TOTAL KNEE  ARTHROPLASTY Right 04/16/2013   Procedure: RIGHT TOTAL KNEE ARTHROPLASTY;  Surgeon: Mauri Pole, MD;  Location: [facility];  Service: Orthopedics;  Laterality: Right;   TOTAL KNEE ARTHROPLASTY Left 09/15/2014   Procedure: LEFT TOTAL KNEE ARTHROPLASTY;  Surgeon: Mauri Pole, MD;  Location: [facility];  Service: Orthopedics;  Laterality: Left;   TUBAL LIGATION  1979   UPPER GI ENDOSCOPY     Social History   Socioeconomic History   Marital status: Widowed    Spouse name: Chrissie Noa   Number of children: 2   Years of education: College   Highest education level: Bachelor's degree (e.g., BA, AB,  BS)  Occupational History   Occupation: Retired  Tobacco Use   Smoking status: Never   Smokeless tobacco: Never   Tobacco comments:    social smoker as a teen  Scientific laboratory technician Use: Never used  Substance and Sexual Activity   Alcohol use: No   Drug use: No   Sexual activity: Not Currently    Birth control/protection: Post-menopausal, Surgical  Other Topics Concern   Not on file  Social History Narrative   Marital status: widowed since 09/16/2015      Children: 2 children (83, 57); 5 grandchild (4 in [place]; 1 in [place]).      Lives: alone in townhome; 1 dog, 2 cats      Employment: psychiatric Education officer, museum; retired in 2015      Tobacco: teenager only      Alcohol:  None      Drugs: none      Exercise: walking in 2019; more active in 2019.  Walking daily small amounts.        ADLs: drives; independent with ADLs; no assistant devices      Advanced Directives: none; FULL CODE; no prolonged measures.   Does not need them.  Daughter/Heather Vista Deck is HCPOA.    Social Determinants of Health   Financial Resource Strain: High Risk   Difficulty of Paying Living Expenses: Hard  Food Insecurity: Not on file  Transportation Needs: Not on file  Physical Activity: Not on file  Stress: Not on file  Social Connections: Not on file  Intimate Partner Violence: Not on file   Social History    Social History Narrative   Marital status: widowed since 09/16/2015      Children: 2 children (53, 30); 5 grandchild (4 in [place]; 1 in [place]).      Lives: alone in townhome; 1 dog, 2 cats      Employment: psychiatric Education officer, museum; retired in 2015      Tobacco: teenager only      Alcohol:  None      Drugs: none      Exercise: walking in 2019; more active in 2019.  Walking daily small amounts.        ADLs: drives; independent with ADLs; no assistant devices      Advanced Directives: none; FULL CODE; no prolonged measures.   Does not need them.  Daughter/Heather Vista Deck is HCPOA.      ROS: Negative.     PE: HEENT: Negative. Lungs: Clear. Cardio: RR.   Assessment/Plan:  Proceed with planned endoscopy.    Forest Gleason [facility] 02/10/2021

## 2021-02-10 NOTE — Transfer of Care (Signed)
Immediate Anesthesia Transfer of Care Note  Patient: Selena Bush  Procedure(s) Performed: Procedure(s): COLONOSCOPY WITH PROPOFOL (N/A)  Patient Location: PACU and Endoscopy Unit  Anesthesia Type:General  Level of Consciousness: sedated  Airway & Oxygen Therapy: Patient Spontanous Breathing and Patient connected to nasal cannula oxygen  Post-op Assessment: Report given to RN and Post -op Vital signs reviewed and stable  Post vital signs: Reviewed and stable  Last Vitals:  Vitals:   02/10/21 0902 02/10/21 0912  BP: 112/62 120/64  Pulse: (!) 57 (!) 57  Resp: 18 15  Temp:    SpO2: 14% 970%    Complications: No apparent anesthesia complications

## 2021-02-10 NOTE — Anesthesia Procedure Notes (Signed)
Date/Time: 02/10/2021 8:25 AM Performed by: Doreen Salvage, CRNA Pre-anesthesia Checklist: Patient identified, Emergency Drugs available, Suction available and Patient being monitored Patient Re-evaluated:Patient Re-evaluated prior to induction Oxygen Delivery Method: Nasal cannula Induction Type: IV induction Dental Injury: Teeth and Oropharynx as per pre-operative assessment  Comments: Nasal cannula with etCO2 monitoring

## 2021-02-10 NOTE — Anesthesia Postprocedure Evaluation (Signed)
Anesthesia Post Note  Patient: Selena Bush  Procedure(s) Performed: COLONOSCOPY WITH PROPOFOL  Patient location during evaluation: Endoscopy Anesthesia Type: General Level of consciousness: awake and alert Pain management: pain level controlled Vital Signs Assessment: post-procedure vital signs reviewed and stable Respiratory status: spontaneous breathing, nonlabored ventilation, respiratory function stable and patient connected to nasal cannula oxygen Cardiovascular status: blood pressure returned to baseline and stable Postop Assessment: no apparent nausea or vomiting Anesthetic complications: no   No notable events documented.   Last Vitals:  Vitals:   02/10/21 0902 02/10/21 0912  BP: 112/62 120/64  Pulse: (!) 57 (!) 57  Resp: 18 15  Temp:    SpO2: 99% 100%    Last Pain:  Vitals:   02/10/21 0912  TempSrc:   PainSc: 0-No pain                 Precious Haws Josette Shimabukuro

## 2021-02-11 ENCOUNTER — Encounter: Payer: Self-pay | Admitting: General Surgery

## 2021-02-11 DIAGNOSIS — J189 Pneumonia, unspecified organism: Secondary | ICD-10-CM | POA: Diagnosis not present

## 2021-02-11 DIAGNOSIS — Z9181 History of falling: Secondary | ICD-10-CM | POA: Diagnosis not present

## 2021-02-11 DIAGNOSIS — I110 Hypertensive heart disease with heart failure: Secondary | ICD-10-CM | POA: Diagnosis not present

## 2021-02-11 DIAGNOSIS — E785 Hyperlipidemia, unspecified: Secondary | ICD-10-CM | POA: Diagnosis not present

## 2021-02-11 DIAGNOSIS — I509 Heart failure, unspecified: Secondary | ICD-10-CM | POA: Diagnosis not present

## 2021-02-11 DIAGNOSIS — G20 Parkinson's disease: Secondary | ICD-10-CM | POA: Diagnosis not present

## 2021-02-24 ENCOUNTER — Telehealth: Payer: Self-pay

## 2021-02-24 DIAGNOSIS — F4312 Post-traumatic stress disorder, chronic: Secondary | ICD-10-CM | POA: Diagnosis not present

## 2021-02-24 DIAGNOSIS — R69 Illness, unspecified: Secondary | ICD-10-CM | POA: Diagnosis not present

## 2021-02-24 NOTE — Progress Notes (Addendum)
Chronic Care Management Pharmacy Assistant   Name: Selena Bush  MRN: [id number] DOB: 02/14/1946  Reason for Encounter: Medication Adherence and Delivery Coordination  Recent office visits:  None since last CCM contact  Recent consult visits:  02/25/21 - Cardiology- Increase furosemide to 20 mg - 2 tablets daily 02/10/21- Colonoscopy procedure  Hospital visits:  None in previous 6 months  Medications: Outpatient Encounter Medications as of 02/24/2021  Medication Sig   acetaminophen (TYLENOL) 500 MG tablet Take by mouth.   albuterol (VENTOLIN HFA) 108 (90 Base) MCG/ACT inhaler Inhale 2 puffs into the lungs every 4 (four) hours as needed.   bisacodyl (DULCOLAX) 5 MG EC tablet Take two tablets morning and two tablets afternoon day prior to Miralax prep.   carbidopa-levodopa (SINEMET IR) 25-100 MG tablet Take by mouth. Take 2 tablets at breakfast, 1 tab at lunch, 1 in the afternoon and 1 at night.   diltiazem (CARDIZEM CD) 180 MG 24 hr capsule TAKE ONE TABLET BY MOUTH EVERY MORNING   DULoxetine (CYMBALTA) 60 MG capsule TAKE 1 CAPSULE BY MOUTH 2 TIMES A DAY for anxiety and depression.   ezetimibe (ZETIA) 10 MG tablet Take 1 tablet (10 mg total) by mouth daily.   fluticasone (FLONASE) 50 MCG/ACT nasal spray Place 2 sprays into both nostrils daily as needed for rhinitis or allergies.   furosemide (LASIX) 20 MG tablet TAKE ONE TABLET BY MOUTH EVERY MORNING   gabapentin (NEURONTIN) 100 MG capsule TAKE 1 CAPSULE BY MOUTH THREE TIMES A DAY for anxiety.   gabapentin (NEURONTIN) 300 MG capsule Take 600 mg by mouth at bedtime.   hydrocortisone 2.5 % cream Apply to affected under breasts 1-2 times a day as directed and as needed for itch. Can also use on face as needed for itch   ketoconazole (NIZORAL) 2 % cream Apply to affected areas under breast twice daily for rash until healed. Can also use on face as needed for dermatitis   metoCLOPramide (REGLAN) 5 MG tablet Take one tablet 30 minutes  prior to colonoscopy prep. May repeat in 4 hours if needed.   metoprolol tartrate (LOPRESSOR) 25 MG tablet TAKE THREE TABLETS BY MOUTH EVERY MORNING and TAKE THREE TABLETS BY MOUTH EVERY EVENING   metroNIDAZOLE (METROCREAM) 0.75 % cream Apply topically 2 (two) times daily. Apply to red areas of face   omeprazole (PRILOSEC) 40 MG capsule Take 40 mg by mouth daily.   polyethylene glycol powder (GLYCOLAX/MIRALAX) 17 GM/SCOOP powder One bottle for colonoscopy prep. Use as directed.   potassium chloride (MICRO-K) 10 MEQ CR capsule Take 10 mEq by mouth 2 (two) times daily.   potassium chloride SA (KLOR-CON) 20 MEQ tablet TAKE ONE TABLET BY MOUTH EVERY MORNING   sotalol (BETAPACE) 120 MG tablet TAKE ONE TABLET BY MOUTH EVERY MORNING and TAKE ONE TABLET BY MOUTH EVERY EVENING   tiZANidine (ZANAFLEX) 4 MG tablet TAKE 2 TABLET (4 MG TOTAL) BY MOUTH DAILY FOR MUSCLE SPASMS AT BEDTIME   No facility-administered encounter medications on file as of 02/24/2021.    BP Readings from Last 3 Encounters:  02/10/21 120/64  01/08/21 128/74  12/14/20 118/80    Lab Results  Component Value Date   HGBA1C 5.9 06/01/2020     Last adherence delivery date: 02/05/21      Patient is due for next adherence delivery on: 03/09/21  Spoke with patient on 02/25/21 reviewed medications and coordinated delivery.  This delivery to include: Adherence Packaging  30 Days  Packs: Tizanidine  4mg  2 tablets daily (2 bedtime) Furosemide 20mg  2 tablet daily (2 breakfast)  *dose increased 02/25/21* Metoprolol 25mg  3 tablets twice a day (3 breakfast and 3 evening meal) Sotalol 120mg  1 tablet twice daily (1 breakfast and 1 evening meal) Ezetimibe 10mg  1 tablet daily (1 breakfast) Diltiazem 180mg  1 tablet daily (1 breakfast) Duloxentine 60mg  1 tablet twice a day (1 breakfast, 1 evening meal) Gabapentin 100mg  1 capsule twice a day and 1 at bedtime (1 breakfast, 1, evening meal, 1 bedtime)  Gabapentin 300mg  2 caps at (2  bedtime) Potassium chloride 43meq 1 tablet daily (1 breakfast)  VIAL medications: Carbidopa-levodopa 25mg -100 mg  6 am: 2 tabs / 9 am: 1 tab/ 12 pm: 1 tab/ 3 pm: 1 tab/ 6 pm: 1 tab/9 pm: 2 tabs Fluticasone Propionate 50 mcg Place 2 sprays into both nostrils daily  Patient declined the following medications this month: Ketoconazole Shampoo 2%-  uses PRN/no longer needed Hydrocortisone 2.5% - uses PRN/no longer needed  Any concerns about your medications? No  How often do you forget or accidentally miss a dose? Rarely  Do you use a pillbox? No  Is patient in packaging Yes  If yes  What is the date on your next pill pack? 02/25/21   Any concerns or issues with your packaging? No  No refill request needed from PCP.  Confirmed delivery date of 03/09/21, advised patient that pharmacy will contact her the morning of delivery.  Patient states she does not check blood pressure at home.   Selena Bush, [name] [phone]  I have reviewed the care management and care coordination activities outlined in this encounter and I am certifying that I agree with the content of this note. No further action required.  Selena Bush, PharmD Clinical Pharmacist [facility] at [facility] [phone]

## 2021-02-25 ENCOUNTER — Telehealth: Payer: Self-pay | Admitting: Cardiology

## 2021-02-25 DIAGNOSIS — I5032 Chronic diastolic (congestive) heart failure: Secondary | ICD-10-CM

## 2021-02-25 MED ORDER — FUROSEMIDE 20 MG PO TABS: 40.0000 mg | ORAL_TABLET | Freq: Every morning | ORAL | 3 refills | Status: DC

## 2021-02-25 NOTE — Telephone Encounter (Signed)
Pt c/o swelling: STAT is pt has developed SOB within 24 hours  If swelling, where is the swelling located? ankles  How much weight have you gained and in what time span? unknown  Have you gained 3 pounds in a day or 5 pounds in a week? unknown  Do you have a log of your daily weights (if so, list)? No  Are you currently taking a fluid pill? Yes  Are you currently SOB? NO  Have you traveled recently? NO

## 2021-02-25 NOTE — Telephone Encounter (Signed)
Spoke with pt, Aware of dr crenshaw's recommendations.  ?New script sent to the pharmacy  ?Lab orders mailed to the pt  ?

## 2021-02-25 NOTE — Telephone Encounter (Signed)
Returned call to patient regarding triage message. Patient states that she has been experiencing swelling now in both ankles and lower legs. Patient states that the swelling is worse in the evening time. Patient reports that she has not been able to weigh herself daily and is unsure if she has gained any weight. Patient reports that she is slightly short of breath at night time when she feels that she has retained some extra fluid. Patient denies any other symptoms. Patient is currently taking Furosemide 20mg  daily. Advised patient of monitoring sodium intake and elevating legs. Advised patient that I would forward message to Dr. Stanford Breed for him to review and advise. Patient verbalized understanding.

## 2021-03-04 ENCOUNTER — Telehealth: Payer: Self-pay | Admitting: Cardiology

## 2021-03-04 NOTE — Telephone Encounter (Signed)
Spoke with pt, she reports increased swelling in her legs by the end of the day for about 2 weeks now. She reports the swelling does go down some overnight but is there by the evening. She does have SOB some in the evening but denies orthopnea.she does not weigh herself and encouraged her to start doing that again. Leg elevation , salt and fluid restrictions discussed with the patient. Patient given the okay to take 40 mg of furosemide twice daily for the next 3 days and take an extra potassium for the next 3 days. She will get lab work drawn next week. She will call back on Monday to let us know how she is doing.

## 2021-03-04 NOTE — Telephone Encounter (Signed)
    Pt is returning call, she ask if someone can call her before end of the day. She confirmed her number

## 2021-03-04 NOTE — Telephone Encounter (Signed)
Tried to call pt, phone just keeps ringing.

## 2021-03-04 NOTE — Telephone Encounter (Signed)
     Pt c/o swelling: STAT is pt has developed SOB within 24 hours  If swelling, where is the swelling located? Left ankles  How much weight have you gained and in what time span? no  Have you gained 3 pounds in a day or 5 pounds in a week? no  Do you have a log of your daily weights (if so, list)? no  Are you currently taking a fluid pill? yes  Are you currently SOB? A little sob at night  Have you traveled recently? No   Pt said her swelling is not getting any better after increasing her lasix, she's on her 6th day her swelling gets really bad at night, right ankle is beginning to swell as well. She also said she not yet receive the paper work to get her labs down near her.

## 2021-03-08 NOTE — Telephone Encounter (Signed)
Pt following up about swelling decrease after doubling furosemide dosage... please advise

## 2021-03-08 NOTE — Telephone Encounter (Signed)
Patient is following up. She states since doubling her Furosemide her swelling has gone down tremendously.

## 2021-03-09 ENCOUNTER — Telehealth: Payer: Self-pay | Admitting: Cardiology

## 2021-03-09 DIAGNOSIS — I5032 Chronic diastolic (congestive) heart failure: Secondary | ICD-10-CM

## 2021-03-09 NOTE — Telephone Encounter (Signed)
Follow Up:    Patient says she needs to talk to [facility] She needs to know what to do about the swelling.

## 2021-03-09 NOTE — Telephone Encounter (Signed)
Called pt and she states that she has increased lasix to '40mg'$  daily. She states that she will continue '40mg'$  daily and make weight log for reference in the future. She will go this week for labwork. Informed pt that we will call with these results

## 2021-03-10 DIAGNOSIS — I5032 Chronic diastolic (congestive) heart failure: Secondary | ICD-10-CM | POA: Diagnosis not present

## 2021-03-11 LAB — BASIC METABOLIC PANEL
BUN/Creatinine Ratio: 17 (ref 12–28)
BUN: 16 mg/dL (ref 8–27)
CO2: 28 mmol/L (ref 20–29)
Calcium: 9.7 mg/dL (ref 8.7–10.3)
Chloride: 99 mmol/L (ref 96–106)
Creatinine, Ser: 0.92 mg/dL (ref 0.57–1.00)
Glucose: 104 mg/dL — ABNORMAL HIGH (ref 65–99)
Potassium: 4.3 mmol/L (ref 3.5–5.2)
Sodium: 140 mmol/L (ref 134–144)
eGFR: 66 mL/min/{1.73_m2} (ref >59)

## 2021-03-12 ENCOUNTER — Telehealth: Payer: Self-pay | Admitting: Cardiology

## 2021-03-12 NOTE — Telephone Encounter (Signed)
Pt c/o swelling: STAT is pt has developed SOB within 24 hours  If swelling, where is the swelling located? Left ankle  How much weight have you gained and in what time span? 2 1/2lbs in 3 days  Have you gained 3 pounds in a day or 5 pounds in a week? No  Do you have a log of your daily weights (if so, list)? Yes   Are you currently taking a fluid pill? Yes   Are you currently SOB? No   Have you traveled recently? No

## 2021-03-12 NOTE — Telephone Encounter (Signed)
Returned call to pt, she states that her weight is up 2.5# today. She does not want to increase medication. She has lab 03-10-21 and Creatinine was normal. She states she will have daughter come and take her to get the compression stockings. Informed pt  that if her weight is not down tomorrow she should take additional or call for further direction. I will asume that her weight will be higher. She will continue to weight/log her weight daily.

## 2021-03-24 ENCOUNTER — Other Ambulatory Visit: Payer: Self-pay | Admitting: Cardiology

## 2021-03-24 DIAGNOSIS — R296 Repeated falls: Secondary | ICD-10-CM | POA: Insufficient documentation

## 2021-03-24 DIAGNOSIS — M25642 Stiffness of left hand, not elsewhere classified: Secondary | ICD-10-CM | POA: Diagnosis not present

## 2021-03-24 DIAGNOSIS — R262 Difficulty in walking, not elsewhere classified: Secondary | ICD-10-CM | POA: Diagnosis not present

## 2021-03-24 DIAGNOSIS — G20 Parkinson's disease: Secondary | ICD-10-CM | POA: Diagnosis not present

## 2021-03-24 DIAGNOSIS — R251 Tremor, unspecified: Secondary | ICD-10-CM | POA: Diagnosis not present

## 2021-03-25 ENCOUNTER — Telehealth: Payer: Self-pay | Admitting: Cardiology

## 2021-03-25 DIAGNOSIS — R69 Illness, unspecified: Secondary | ICD-10-CM | POA: Diagnosis not present

## 2021-03-25 DIAGNOSIS — F4312 Post-traumatic stress disorder, chronic: Secondary | ICD-10-CM | POA: Diagnosis not present

## 2021-03-25 NOTE — Telephone Encounter (Signed)
Pt c/o swelling: STAT is pt has developed SOB within 24 hours  How much weight have you gained and in what time span? 2-3 lbs  If swelling, where is the swelling located? Left ankle  Are you currently taking a fluid pill? Yes  Are you currently SOB? No  Do you have a log of your daily weights (if so, list)? 176.7 on 03/24/21 176.6-today  Have you gained 3 pounds in a day or 5 pounds in a week? Not sure  Have you traveled recently? No

## 2021-03-25 NOTE — Telephone Encounter (Signed)
Patient called again to document her swelling. She was told by her Neurologist yesterday to follow up with Dr. Stanford Breed

## 2021-03-25 NOTE — Telephone Encounter (Signed)
Spoke to patient she stated she saw PA at her neurologist office yesterday.She wanted her to be seen for swelling in both ankles,left worse.No sob.Weight stable.Appointment scheduled with [name] NP 8/25 at 8:30 am.

## 2021-03-30 ENCOUNTER — Telehealth: Payer: Self-pay

## 2021-03-30 NOTE — Chronic Care Management (AMB) (Addendum)
Chronic Care Management Pharmacy Assistant   Name: Selena Bush  MRN: [id number] DOB: 1945/09/13   Reason for Encounter: Medication Adherence and Delivery Coordination   Recent office visits:  None since last CCM contact  Recent consult visits:  02/25/21 - Cardiology, telephone call - Pt called due to bilateral leg swelling. Increased Lasix from 20 mg  to '40mg'$  1 tablet daily   Hospital visits:  None in previous 6 months  Medications: Outpatient Encounter Medications as of 03/30/2021  Medication Sig   acetaminophen (TYLENOL) 500 MG tablet Take by mouth.   albuterol (VENTOLIN HFA) 108 (90 Base) MCG/ACT inhaler Inhale 2 puffs into the lungs every 4 (four) hours as needed.   bisacodyl (DULCOLAX) 5 MG EC tablet Take two tablets morning and two tablets afternoon day prior to Miralax prep.   carbidopa-levodopa (SINEMET IR) 25-100 MG tablet Take by mouth. Take 2 tablets at breakfast, 1 tab at lunch, 1 in the afternoon and 1 at night.   diltiazem (CARDIZEM CD) 180 MG 24 hr capsule TAKE ONE TABLET BY MOUTH EVERY MORNING   DULoxetine (CYMBALTA) 60 MG capsule TAKE 1 CAPSULE BY MOUTH 2 TIMES A DAY for anxiety and depression.   ezetimibe (ZETIA) 10 MG tablet Take 1 tablet (10 mg total) by mouth daily.   fluticasone (FLONASE) 50 MCG/ACT nasal spray Place 2 sprays into both nostrils daily as needed for rhinitis or allergies.   furosemide (LASIX) 20 MG tablet Take 2 tablets (40 mg total) by mouth every morning.   gabapentin (NEURONTIN) 100 MG capsule TAKE 1 CAPSULE BY MOUTH THREE TIMES A DAY for anxiety.   gabapentin (NEURONTIN) 300 MG capsule Take 600 mg by mouth at bedtime.   hydrocortisone 2.5 % cream Apply to affected under breasts 1-2 times a day as directed and as needed for itch. Can also use on face as needed for itch   ketoconazole (NIZORAL) 2 % cream Apply to affected areas under breast twice daily for rash until healed. Can also use on face as needed for dermatitis   metoCLOPramide  (REGLAN) 5 MG tablet Take one tablet 30 minutes prior to colonoscopy prep. May repeat in 4 hours if needed.   metoprolol tartrate (LOPRESSOR) 25 MG tablet TAKE THREE TABLETS BY MOUTH EVERY MORNING and TAKE THREE TABLETS BY MOUTH EVERY EVENING   metroNIDAZOLE (METROCREAM) 0.75 % cream Apply topically 2 (two) times daily. Apply to red areas of face   omeprazole (PRILOSEC) 40 MG capsule Take 40 mg by mouth daily.   polyethylene glycol powder (GLYCOLAX/MIRALAX) 17 GM/SCOOP powder One bottle for colonoscopy prep. Use as directed.   potassium chloride (MICRO-K) 10 MEQ CR capsule Take 10 mEq by mouth 2 (two) times daily.   potassium chloride SA (KLOR-CON) 20 MEQ tablet TAKE ONE TABLET BY MOUTH EVERY MORNING   sotalol (BETAPACE) 120 MG tablet TAKE ONE TABLET BY MOUTH EVERY MORNING and TAKE ONE TABLET BY MOUTH EVERY EVENING   tiZANidine (ZANAFLEX) 4 MG tablet TAKE 2 TABLET (4 MG TOTAL) BY MOUTH DAILY FOR MUSCLE SPASMS AT BEDTIME   No facility-administered encounter medications on file as of 03/30/2021.   BP Readings from Last 3 Encounters:  02/10/21 120/64  01/08/21 128/74  12/14/20 118/80    Lab Results  Component Value Date   HGBA1C 5.9 06/01/2020      Recent OV, Consult or Hospital visit:  Recent medication changes indicated: 02/25/21 -Cardiology increased Lasix to '40mg'$  1 tablet daily    Last adherence delivery date: 03/07/21  Patient is due for next adherence delivery on: 04/07/21  Spoke with patient on 03/31/21 reviewed medications and coordinated delivery.  This delivery to include: Adherence Packaging  30 Days  Packs: Furosemide 40 mg  1 tablet daily (breakfast) Ezetimibe '10mg'$  1 tablet daily (breakfast) Diltiazem '180mg'$  1 tablet daily (breakfast) Potassium chloride 24mq 1 tablet daily (breakfast) Sotalol '120mg'$  1 tablet twice daily (1 breakfast and 1 evening meal) Gabapentin '100mg'$  1 capsule three times a day (1 breakfast, 1 evening meal, 1 bedtime)  Gabapentin '300mg'$  2 tablets at  bedtime (2 bedtime) Metoprolol '25mg'$  3 tablets twice a day (3 breakfast and 3 evening meal) Duloxentine '60mg'$  1 tablet twice a day (1 breakfast, 1 evening meal) Tizanidine '4mg'$  2 tablets daily (2 bedtime)  VIAL medications: Carbidopa-levodopa '25mg'$ -100 mg  6 am: 2 tabs / 9 am: 1 tab/ 12 pm: 1 tab/ 3 pm: 1 tab/ 6 pm: 1 tab/ 9 pm: 2 tabs Fluticasone Propionate 50 mcg Place 2 sprays into both nostrils daily  - PRN  Patient declined the following medications this month: Ketoconazole Shampoo 2%-  uses PRN Hydrocortisone 2.5% - uses PRN   Any concerns about your medications? No  How often do you forget or accidentally miss a dose? Never  Is patient in packaging Yes - Pt unable to verify next pack date  No refill request needed from PCP.  Confirmed delivery date of 04/07/2021, advised patient that pharmacy will contact them the morning of delivery.  Recent blood pressure readings are as follows: no readings available   MDebbora Dus CPP notified  VAvel Sensor CNew EnglandAssistant [phone] I have reviewed the care management and care coordination activities outlined in this encounter and I am certifying that I agree with the content of this note. No further action required.  MDebbora Dus PharmD Clinical Pharmacist [phone]

## 2021-04-01 NOTE — Progress Notes (Signed)
Medication refill request for Gabapentin '100mg'$  submitted to Dr.Zach Potter-Neurology-to be sent over to [place], CPP notified  [name], [place] [phone]  Total time spent for month CPA: 10 min.

## 2021-04-06 DIAGNOSIS — I110 Hypertensive heart disease with heart failure: Secondary | ICD-10-CM | POA: Diagnosis not present

## 2021-04-06 DIAGNOSIS — M5030 Other cervical disc degeneration, unspecified cervical region: Secondary | ICD-10-CM | POA: Diagnosis not present

## 2021-04-06 DIAGNOSIS — M5136 Other intervertebral disc degeneration, lumbar region: Secondary | ICD-10-CM | POA: Diagnosis not present

## 2021-04-06 DIAGNOSIS — G20 Parkinson's disease: Secondary | ICD-10-CM | POA: Diagnosis not present

## 2021-04-06 DIAGNOSIS — R69 Illness, unspecified: Secondary | ICD-10-CM | POA: Diagnosis not present

## 2021-04-06 DIAGNOSIS — M1712 Unilateral primary osteoarthritis, left knee: Secondary | ICD-10-CM | POA: Diagnosis not present

## 2021-04-06 DIAGNOSIS — I4891 Unspecified atrial fibrillation: Secondary | ICD-10-CM | POA: Diagnosis not present

## 2021-04-06 DIAGNOSIS — I509 Heart failure, unspecified: Secondary | ICD-10-CM | POA: Diagnosis not present

## 2021-04-06 DIAGNOSIS — M109 Gout, unspecified: Secondary | ICD-10-CM | POA: Diagnosis not present

## 2021-04-08 ENCOUNTER — Encounter (HOSPITAL_BASED_OUTPATIENT_CLINIC_OR_DEPARTMENT_OTHER): Payer: Self-pay | Admitting: Family

## 2021-04-08 ENCOUNTER — Other Ambulatory Visit: Payer: Self-pay

## 2021-04-08 ENCOUNTER — Ambulatory Visit (HOSPITAL_BASED_OUTPATIENT_CLINIC_OR_DEPARTMENT_OTHER): Payer: Medicare HMO | Admitting: Family

## 2021-04-08 VITALS — BP 112/68 | HR 54 | Ht 60.0 in | Wt 179.6 lb

## 2021-04-08 DIAGNOSIS — I480 Paroxysmal atrial fibrillation: Secondary | ICD-10-CM

## 2021-04-08 DIAGNOSIS — I10 Essential (primary) hypertension: Secondary | ICD-10-CM

## 2021-04-08 DIAGNOSIS — I5031 Acute diastolic (congestive) heart failure: Secondary | ICD-10-CM

## 2021-04-08 DIAGNOSIS — E782 Mixed hyperlipidemia: Secondary | ICD-10-CM

## 2021-04-08 DIAGNOSIS — R600 Localized edema: Secondary | ICD-10-CM

## 2021-04-08 MED ORDER — SPIRONOLACTONE 25 MG PO TABS: 25.0000 mg | ORAL_TABLET | Freq: Every day | ORAL | 1 refills | Status: DC

## 2021-04-08 MED ORDER — POTASSIUM CHLORIDE ER 10 MEQ PO CPCR: 10.0000 meq | ORAL_CAPSULE | Freq: Every day | ORAL | 1 refills | Status: DC

## 2021-04-08 NOTE — Patient Instructions (Addendum)
Medication Instructions:  Your physician has recommended you make the following change in your medication:   START Spironolactone one '25mg'$  tablet daily  CHANGE Potassium to 77mq daily  (take a half-tablet of your 20 mEq tablet to use those up if you wish)  *If you need a refill on your cardiac medications before your next appointment, please call your pharmacy*   Lab Work: Your physician recommends that you return for lab work in 1 week for BMP [facility]. You do not need to be fasting and simply can stop by at your convenience.  If you have labs (blood work) drawn today and your tests are completely normal, you will receive your results only by: [facility](if you have MyChart) OR A paper copy in the mail. If you have any lab test that is abnormal or we need to change your treatment, we will call you to review the results.  Testing/Procedures: Your physician has requested that you have an echocardiogram. Echocardiography is a painless test that uses sound waves to create images of your heart. It provides your doctor with information about the size and shape of your heart and how well your heart's chambers and valves are working. This procedure takes approximately one hour. There are no restrictions for this procedure.    Follow-Up: [facility] and your health needs are our priority.  As part of our continuing mission to provide you with exceptional heart care, we have created designated Provider Care Teams.  These Care Teams include your primary Cardiologist (physician) and Advanced Practice Providers (APPs -  Physician Assistants and Nurse Practitioners) who all work together to provide you with the care you need, when you need it.  We recommend signing up for the patient portal called "MyChart".  Sign up information is provided on this After Visit Summary.  MyChart is used to connect with patients for Virtual Visits (Telemedicine).  Patients are able to view  lab/test results, encounter notes, upcoming appointments, etc.  Non-urgent messages can be sent to your provider as well.   To learn more about what you can do with MyChart, go to [email or web address]    Your next appointment:   05/21/21 at [facility] with KJory Sims NP   Other Instructions  Recommend weighing daily and keeping a log. Please call our office if you have weight gain of 2 pounds overnight or 5 pounds in 1 week.   Date  Time Weight                                          '

## 2021-04-08 NOTE — Progress Notes (Signed)
Office Visit    Patient Name: Selena Bush Date of Encounter: 04/08/2021  PCP:  Pleas Koch, NP   [facility]  Cardiologist:  [name], MD  Advanced Practice Provider:  No care team member to display Electrophysiologist:  None     Chief Complaint    Selena Bush is a 75 y.o. female with a hx of HFpEF, PAF, pericardial effusion, HTN, HLD presents today for LE edema   Past Medical History    Past Medical History:  Diagnosis Date   Anxiety    Atrial fibrillation ([place])    Chronic diastolic CHF (congestive heart failure) ([place])    a. echo 09/2014: EF [id number], diastolic dysfunction, mild LVH, nl RV size & systolic function, mildly dilated LA (4.3 cm), mild MR/TR, mildly elevated PASP 36.7 mm Hg   DDD (degenerative disc disease), cervical    DDD (degenerative disc disease), lumbar    Depression    Diffuse cystic mastopathy 2014   Dyspnea    Dysrhythmia    GERD (gastroesophageal reflux disease)    Gout    Headache    rare   HLD (hyperlipidemia)    a. statin intolerant 2/2 myalgias   Hx of dysplastic nevus 12/25/2018   L medial ankle   Hypercholesterolemia    Hypertension    Malignant neoplasm of upper-outer quadrant of female breast ([place]) 10/2012   Papillary DCIS, sentinel node negative. DR/PR positive. PARTIAL RIGHT MASTECTOMY FOR BREAST CANCER--HAD RADIATION - NO CHEMO --DR. CRYSTAL [place] ONCOLOGIST   Obesity    OSA on CPAP    Osteoarthritis of both knees    a. s/p right TKA 04/2013 & left TKA 09/2014   Otitis externa    Personal history of radiation therapy 2015   RIGHT breast-mammosite per pt   Sleep difficulties    LUNESTA HAS HELPED   Vaginal cyst    Past Surgical History:  Procedure Laterality Date   ABDOMINAL HYSTERECTOMY  1992   DUB; fibroids; endometriosis.  One remaining ovary.     BREAST LUMPECTOMY Right 2015   Papillary DCIS, sentinel node negative. DR/PR positive. PARTIAL RIGHT MASTECTOMY FOR BREAST  CANCER--HAD RADIATION - NO CHEMO --DR. CRYSTAL [place] ONCOLOGIST   BREAST SURGERY Right 10/2012   Wide excision,APB RT 10 mm papillary DCIS, ER/PR positive. Sentinel node negative. Partial breast radiation.   CATARACT EXTRACTION, BILATERAL  02/13/2016   [facility].   CHOLECYSTECTOMY  1994   COLONOSCOPY  2015   1 benign polyp-every 5 years/ Dr Candace Cruise   COLONOSCOPY WITH PROPOFOL N/A 02/10/2021   Procedure: COLONOSCOPY WITH PROPOFOL;  Surgeon: Robert Bellow, MD;  Location: [facility] ENDOSCOPY;  Service: Endoscopy;  Laterality: N/A;   ERCP  1995   JOINT REPLACEMENT Right 04/2013   knee   PERICARDIOCENTESIS N/A 11/13/2017   Procedure: PERICARDIOCENTESIS;  Surgeon: Nelva Bush, MD;  Location: [place] CV LAB;  Service: Cardiovascular;  Laterality: N/A;   TOTAL KNEE ARTHROPLASTY Right 04/16/2013   Procedure: RIGHT TOTAL KNEE ARTHROPLASTY;  Surgeon: Mauri Pole, MD;  Location: [facility] ORS;  Service: Orthopedics;  Laterality: Right;   TOTAL KNEE ARTHROPLASTY Left 09/15/2014   Procedure: LEFT TOTAL KNEE ARTHROPLASTY;  Surgeon: Mauri Pole, MD;  Location: [facility] ORS;  Service: Orthopedics;  Laterality: Left;   TUBAL LIGATION  1979   UPPER GI ENDOSCOPY      Allergies  Allergies  Allergen Reactions   Penicillins Anaphylaxis    anaphylaxis  Has patient had a PCN reaction  causing immediate rash, facial/tongue/throat swelling, SOB or lightheadedness with hypotension: Yes Has patient had a PCN reaction causing severe rash involving mucus membranes or skin necrosis: No Has patient had a PCN reaction that required hospitalization: No Has patient had a PCN reaction occurring within the last 10 years: No If all of the above answers are "NO", then may proceed with Cephalosporin use.    Sulfa Antibiotics Anaphylaxis   Codeine Other (See Comments) and Nausea And Vomiting    Gi problems    Statins Other (See Comments)    Leg pains   Aspirin Other (See Comments)    "burned my stomach  intensely" Abdominal pain and burning    History of Present Illness    Selena Bush is a 75 y.o. female with a hx of HFpEF, PAF, pericardial effusion, HTN, HLD last seen 07/13/20 by Dr. Stanford Breed.  Admission 10/2017 with atypical chest pain and PAF. Treated with sotalol and Xrelto. Follow up echo with significant pericardial effusion with tamponade requiring pericardiocentesis 11/14/19. Cytology was negative. Thought to have pericarditis with hemorrhagic pericardial effusion from initiation of anticoagulation. She was then admitted ot outside hospital with PNA and recurrent atrial fibrillation. Follow up echo 11/2017 normal LVEF, moderate LVH, mild TR, small pericardial effusion and small pleural effusion. Most recent echo 05/2018 normal LVEF, mildly dilated ascending arota, no pericardial effusion.   She was last seen by Dr. Stanford Breed 06/2020 doing well from a cardiac perspective. She was maintaining NSR. She contacted the office 02/25/21 due to edema and Lasix was increased to '40mg'$  daily.   Presents today for follow up with her caretaker. Reports dyspnea on exertion is stable at baseline. She sleeps in a chair at night as she feels more comfortable. Has been doing this for 3-4 months due to mobility. She has stopped using her CPAP in the chair. She enjoys going out during the day. Does sit with her legs elevated. She does notice an improvement with compression socks. Drinks Splenda decaffeinated iced tea 2-3 glasses per day with one cup of coffee in the morning. She eats premade frozen meals.  EKGs/Labs/Other Studies Reviewed:   The following studies were reviewed today:  Echo 05/2018 Left ventricle: The cavity size was normal. There was mild    concentric hypertrophy. Systolic function was normal. The    estimated ejection fraction was in the range of 60% to 65%. Wall    motion was normal; there were no regional wall motion    abnormalities. Doppler parameters are consistent with abnormal     left ventricular relaxation (grade 1 diastolic dysfunction).    Doppler parameters are consistent with high ventricular filling    pressure.  - Aortic valve: Noncoronary cusp mobility was severely restricted.  - Aorta: Ascending aortic diameter: 40 mm (S).  - Ascending aorta: The ascending aorta was mildly dilated.  - Mitral valve: Calcified annulus. There was no regurgitation.  - Right ventricle: The cavity size was normal. Wall thickness was    normal. Systolic function was normal.  - Atrial septum: No defect or patent foramen ovale was identified.  - Tricuspid valve: There was mild regurgitation.  - Pulmonary arteries: Systolic pressure was within the normal    range. PA peak pressure: 29 mm Hg (S).   EKG:  EKG is ordered today.  The ekg ordered today demonstrates SB 54 with no acute ST/T wave changes. Baseline tremor noted due to Parkinson's.  Recent Labs: 12/31/2020: ALT 4 03/10/2021: BUN 16; Creatinine, Ser 0.92; Potassium 4.3;  Sodium 140  Recent Lipid Panel    Component Value Date/Time   CHOL 165 12/31/2020 0933   TRIG 109 12/31/2020 0933   HDL 62 12/31/2020 0933   CHOLHDL 2.7 12/31/2020 0933   CHOLHDL 4 06/01/2020 1111   VLDL 32.2 06/01/2020 1111   LDLCALC 84 12/31/2020 0933    Home Medications   Current Meds  Medication Sig   acetaminophen (TYLENOL) 500 MG tablet Take by mouth.   albuterol (VENTOLIN HFA) 108 (90 Base) MCG/ACT inhaler Inhale 2 puffs into the lungs every 4 (four) hours as needed.   bisacodyl (DULCOLAX) 5 MG EC tablet Take two tablets morning and two tablets afternoon day prior to Miralax prep.   carbidopa-levodopa (SINEMET IR) 25-100 MG tablet Take by mouth. Take 2 tablets at breakfast, 1 tab at lunch, 1 in the afternoon and 1 at night.   diltiazem (CARDIZEM CD) 180 MG 24 hr capsule TAKE ONE TABLET BY MOUTH EVERY MORNING   DULoxetine (CYMBALTA) 60 MG capsule TAKE 1 CAPSULE BY MOUTH 2 TIMES A DAY for anxiety and depression.   fluticasone (FLONASE) 50  MCG/ACT nasal spray Place 2 sprays into both nostrils daily as needed for rhinitis or allergies.   furosemide (LASIX) 20 MG tablet Take 2 tablets (40 mg total) by mouth every morning.   gabapentin (NEURONTIN) 100 MG capsule TAKE 1 CAPSULE BY MOUTH THREE TIMES A DAY for anxiety.   gabapentin (NEURONTIN) 300 MG capsule Take 600 mg by mouth at bedtime.   hydrocortisone 2.5 % cream Apply to affected under breasts 1-2 times a day as directed and as needed for itch. Can also use on face as needed for itch   ketoconazole (NIZORAL) 2 % cream Apply to affected areas under breast twice daily for rash until healed. Can also use on face as needed for dermatitis   metoCLOPramide (REGLAN) 5 MG tablet Take one tablet 30 minutes prior to colonoscopy prep. May repeat in 4 hours if needed.   metoprolol tartrate (LOPRESSOR) 25 MG tablet TAKE THREE TABLETS BY MOUTH EVERY MORNING and TAKE THREE TABLETS BY MOUTH EVERY EVENING   metroNIDAZOLE (METROCREAM) 0.75 % cream Apply topically 2 (two) times daily. Apply to red areas of face   omeprazole (PRILOSEC) 40 MG capsule Take 40 mg by mouth daily.   polyethylene glycol powder (GLYCOLAX/MIRALAX) 17 GM/SCOOP powder One bottle for colonoscopy prep. Use as directed.   sotalol (BETAPACE) 120 MG tablet TAKE ONE TABLET BY MOUTH EVERY MORNING and TAKE ONE TABLET BY MOUTH EVERY EVENING   spironolactone (ALDACTONE) 25 MG tablet Take 1 tablet (25 mg total) by mouth daily.   tiZANidine (ZANAFLEX) 4 MG tablet TAKE 2 TABLET (4 MG TOTAL) BY MOUTH DAILY FOR MUSCLE SPASMS AT BEDTIME   [DISCONTINUED] potassium chloride (MICRO-K) 10 MEQ CR capsule Take 10 mEq by mouth 2 (two) times daily.   [DISCONTINUED] potassium chloride SA (KLOR-CON) 20 MEQ tablet TAKE ONE TABLET BY MOUTH EVERY MORNING     Review of Systems      All other systems reviewed and are otherwise negative except as noted above.  Physical Exam    VS:  BP 112/68   Pulse (!) 54   Ht 5' (1.524 m)   Wt 179 lb 9.6 oz (81.5  kg)   SpO2 98%   BMI 35.08 kg/m  , BMI Body mass index is 35.08 kg/m.  Wt Readings from Last 3 Encounters:  04/08/21 179 lb 9.6 oz (81.5 kg)  02/10/21 170 lb (77.1 kg)  01/08/21  177 lb (80.3 kg)     GEN: Well nourished, well developed, in no acute distress. HEENT: normal. Neck: Supple, no JVD, carotid bruits, or masses. Cardiac: RRR, no murmurs, rubs, or gallops. No clubbing, cyanosis. Bilateral 2+ pitting pedal edema.  Radials/PT 2+ and equal bilaterally.  Respiratory:  Respirations regular and unlabored, clear to auscultation bilaterally. GI: Soft, nontender, nondistended. MS: No deformity or atrophy. Skin: Warm and dry, no rash. Neuro:  Strength and sensation are intact. Psych: Normal affect.  Assessment & Plan    LE edema / HFpEF - Bilateral pedal 1+ pitting edema. No orthopnea, PND.  Endorses eating high salt foods premade frozen meals.  Encouraged to reduce sodium intake.  Recommend compression stockings and elevate lower extremities when sitting.  She has hesitant regarding additional diuresis with Lasix as she tells me she goes to the bathroom constantly.  As such, continue Lasix 40 mg daily.  Start spironolactone 25 mg daily with repeat BMP in 1 week.  We will reduce her potassium to 10 mill equivalent daily to prevent hyperkalemia.  Ultimately may require discontinuation of potassium supplement.  HTN -  BP well controlled. Continue current antihypertensive regimen.    HLD / Statin myalgia - 01/10/21 LDL 84. Statin intolerant with myalgias. Continue Zetia '10mg'$  QD.   PAF - Previous episode felt to be triggered by pericarditis/pneumonia. Anticoagulation has not been utilized given previous pericardial hemorrhagic effusion. EKG today shows sinus bradycardia. Asymptomatic in regards to bradycardia with no lightheadedness nor dizziness. Continue current dose Dilitazem and Metoprolol.  Disposition: Follow up in 6 week(s) with Dr. Stanford Breed or APP.  Signed, Loel Dubonnet,  NP 04/08/2021, 4:47 PM [facility]

## 2021-04-10 ENCOUNTER — Telehealth: Payer: Self-pay

## 2021-04-10 NOTE — Telephone Encounter (Signed)
Tried calling pt to schedule wellness visit. Unable to reach pt at this time. I do not see where patient has ever has AWV.

## 2021-04-12 ENCOUNTER — Telehealth: Payer: Self-pay

## 2021-04-12 NOTE — Telephone Encounter (Signed)
Patient called for gabapentin dose clarification. Pharmacy received a new rx for gabapentin from Dr. Melrose Nakayama for increased dose gabapentin 100 mg 2 tabs TID. Pt was previously on 100 mg 1 tab TID.  I believe this was an error since it was intended to be a refill on her prior dose gabapentin 100 mg 1 tab TID. Attempted to call Dr. Weber Cooks office to confirm. Explained to patient she may continue taking as previously prescribed 100 mg TID (this is in addition to her prescription for 300 mg - 2 tabs at bedtime). We will contact Dr. Lannie Fields clinic for updated rx once they open from lunch break.  [name], PharmD Clinical Pharmacist [facility] [phone]

## 2021-04-12 NOTE — Telephone Encounter (Signed)
Dr. Lannie Fields clinic confirmed it was an automated error. Dose should be 100 mg TID. Updated rx will be sent to [facility].

## 2021-04-13 ENCOUNTER — Ambulatory Visit: Payer: Medicare HMO

## 2021-04-13 NOTE — Progress Notes (Deleted)
Subjective:   Selena Bush is a 75 y.o. female who presents for Medicare Annual (Subsequent) preventive examination.  Review of Systems - Defer to PCP       Objective:    There were no vitals filed for this visit. There is no height or weight on file to calculate BMI.  Advanced Directives 02/10/2021 01/18/2020 08/31/2018 08/12/2018 01/10/2018 01/01/2018 11/13/2017  Does Patient Have a Medical Advance Directive? No No No No No No No  Would patient like information on creating a medical advance directive? No - Patient declined - - No - Patient declined No - Patient declined No - Patient declined No - Patient declined  Pre-existing out of facility DNR order (yellow form or pink MOST form) - - - - - - -    Current Medications (verified) Outpatient Encounter Medications as of 04/13/2021  Medication Sig   acetaminophen (TYLENOL) 500 MG tablet Take by mouth.   albuterol (VENTOLIN HFA) 108 (90 Base) MCG/ACT inhaler Inhale 2 puffs into the lungs every 4 (four) hours as needed.   bisacodyl (DULCOLAX) 5 MG EC tablet Take two tablets morning and two tablets afternoon day prior to Miralax prep.   carbidopa-levodopa (SINEMET IR) 25-100 MG tablet Take by mouth. Take 2 tablets at breakfast, 1 tab at lunch, 1 in the afternoon and 1 at night.   diltiazem (CARDIZEM CD) 180 MG 24 hr capsule TAKE ONE TABLET BY MOUTH EVERY MORNING   DULoxetine (CYMBALTA) 60 MG capsule TAKE 1 CAPSULE BY MOUTH 2 TIMES A DAY for anxiety and depression.   ezetimibe (ZETIA) 10 MG tablet Take 1 tablet (10 mg total) by mouth daily.   fluticasone (FLONASE) 50 MCG/ACT nasal spray Place 2 sprays into both nostrils daily as needed for rhinitis or allergies.   furosemide (LASIX) 20 MG tablet Take 2 tablets (40 mg total) by mouth every morning.   gabapentin (NEURONTIN) 100 MG capsule TAKE 1 CAPSULE BY MOUTH THREE TIMES A DAY for anxiety.   gabapentin (NEURONTIN) 300 MG capsule Take 600 mg by mouth at bedtime.   hydrocortisone 2.5 % cream  Apply to affected under breasts 1-2 times a day as directed and as needed for itch. Can also use on face as needed for itch   ketoconazole (NIZORAL) 2 % cream Apply to affected areas under breast twice daily for rash until healed. Can also use on face as needed for dermatitis   metoCLOPramide (REGLAN) 5 MG tablet Take one tablet 30 minutes prior to colonoscopy prep. May repeat in 4 hours if needed.   metoprolol tartrate (LOPRESSOR) 25 MG tablet TAKE THREE TABLETS BY MOUTH EVERY MORNING and TAKE THREE TABLETS BY MOUTH EVERY EVENING   metroNIDAZOLE (METROCREAM) 0.75 % cream Apply topically 2 (two) times daily. Apply to red areas of face   omeprazole (PRILOSEC) 40 MG capsule Take 40 mg by mouth daily.   polyethylene glycol powder (GLYCOLAX/MIRALAX) 17 GM/SCOOP powder One bottle for colonoscopy prep. Use as directed.   potassium chloride (MICRO-K) 10 MEQ CR capsule Take 1 capsule (10 mEq total) by mouth daily.   sotalol (BETAPACE) 120 MG tablet TAKE ONE TABLET BY MOUTH EVERY MORNING and TAKE ONE TABLET BY MOUTH EVERY EVENING   spironolactone (ALDACTONE) 25 MG tablet Take 1 tablet (25 mg total) by mouth daily.   tiZANidine (ZANAFLEX) 4 MG tablet TAKE 2 TABLET (4 MG TOTAL) BY MOUTH DAILY FOR MUSCLE SPASMS AT BEDTIME   No facility-administered encounter medications on file as of 04/13/2021.  Allergies (verified) Penicillins, Sulfa antibiotics, Codeine, Statins, and Aspirin   History: Past Medical History:  Diagnosis Date   Anxiety    Atrial fibrillation (HCC)    Chronic diastolic CHF (congestive heart failure) ([place])    a. echo 09/2014: EF [id number], diastolic dysfunction, mild LVH, nl RV size & systolic function, mildly dilated LA (4.3 cm), mild MR/TR, mildly elevated PASP 36.7 mm Hg   DDD (degenerative disc disease), cervical    DDD (degenerative disc disease), lumbar    Depression    Diffuse cystic mastopathy 2014   Dyspnea    Dysrhythmia    GERD (gastroesophageal reflux disease)    Gout     Headache    rare   HLD (hyperlipidemia)    a. statin intolerant 2/2 myalgias   Hx of dysplastic nevus 12/25/2018   L medial ankle   Hypercholesterolemia    Hypertension    Malignant neoplasm of upper-outer quadrant of female breast ([place]) 10/2012   Papillary DCIS, sentinel node negative. DR/PR positive. PARTIAL RIGHT MASTECTOMY FOR BREAST CANCER--HAD RADIATION - NO CHEMO --DR. CRYSTAL [place] ONCOLOGIST   Obesity    OSA on CPAP    Osteoarthritis of both knees    a. s/p right TKA 04/2013 & left TKA 09/2014   Otitis externa    Personal history of radiation therapy 2015   RIGHT breast-mammosite per pt   Sleep difficulties    LUNESTA HAS HELPED   Vaginal cyst    Past Surgical History:  Procedure Laterality Date   ABDOMINAL HYSTERECTOMY  1992   DUB; fibroids; endometriosis.  One remaining ovary.     BREAST LUMPECTOMY Right 2015   Papillary DCIS, sentinel node negative. DR/PR positive. PARTIAL RIGHT MASTECTOMY FOR BREAST CANCER--HAD RADIATION - NO CHEMO --DR. CRYSTAL [place] ONCOLOGIST   BREAST SURGERY Right 10/2012   Wide excision,APB RT 10 mm papillary DCIS, ER/PR positive. Sentinel node negative. Partial breast radiation.   CATARACT EXTRACTION, BILATERAL  02/13/2016   [facility].   CHOLECYSTECTOMY  1994   COLONOSCOPY  2015   1 benign polyp-every 5 years/ Dr Candace Cruise   COLONOSCOPY WITH PROPOFOL N/A 02/10/2021   Procedure: COLONOSCOPY WITH PROPOFOL;  Surgeon: Robert Bellow, MD;  [facility] END[facility];  Service: Endoscopy;  Laterality: N/A;   ERCP  1995   JOINT REPLACEMENT Right 04/2013   knee   PERICARDIOCENTESIS N/A 11/13/2017   Procedure: PERICARDIOCENTESIS;  Surgeon: Nelva Bush, MD;  Location: [place] [facility] LAB;  Service: Cardiovascular;  Laterality: N/A;   TOTAL KNEE ARTHROPLASTY Right 04/16/2013   Procedure: RIGHT TOTAL KNEE ARTHROPLASTY;  Surgeon: Mauri Pole, MD;  Location: [facility] ORS;  Service: Orthopedics;  Laterality: Right;   TOTAL KNEE ARTHROPLASTY Left  09/15/2014   Procedure: LEFT TOTAL KNEE ARTHROPLASTY;  Surgeon: Mauri Pole, MD;  Location: [facility] ORS;  Service: Orthopedics;  Laterality: Left;   TUBAL LIGATION  1979   UPPER GI ENDOSCOPY     Family History  Problem Relation Age of Onset   Colon cancer Mother    Cancer Mother 36       colon cancer   Melanoma Father    Cancer Father 55       melanoma   Colon cancer Maternal Grandfather    Breast cancer Maternal Grandfather    Melanoma Sister    Melanoma Sister    Social History   Socioeconomic History   Marital status: Widowed    Spouse name: Chrissie Noa   Number of children: 2   Years of education:  College   Highest education level: Bachelor's degree (e.g., BA, AB, BS)  Occupational History   Occupation: Retired  Tobacco Use   Smoking status: Never   Smokeless tobacco: Never   Tobacco comments:    social smoker as a teen  Scientific laboratory technician Use: Never used  Substance and Sexual Activity   Alcohol use: No   Drug use: No   Sexual activity: Not Currently    Birth control/protection: Post-menopausal, Surgical  Other Topics Concern   Not on file  Social History Narrative   Marital status: widowed since 09/16/2015      Children: 2 children (78, 15); 5 grandchild (4 in [place]; 1 in [place]).      Lives: alone in townhome; 1 dog, 2 cats      Employment: psychiatric Education officer, museum; retired in 2015      Tobacco: teenager only      Alcohol:  None      Drugs: none      Exercise: walking in 2019; more active in 2019.  Walking daily small amounts.        ADLs: drives; independent with ADLs; no assistant devices      Advanced Directives: none; FULL CODE; no prolonged measures.   Does not need them.  Daughter/Heather Vista Deck is HCPOA.    Social Determinants of Health   Financial Resource Strain: High Risk   Difficulty of Paying Living Expenses: Hard  Food Insecurity: Not on file  Transportation Needs: Not on file  Physical Activity: Not on file  Stress: Not on file  Social  Connections: Not on file    Tobacco Counseling Counseling given: Not Answered Tobacco comments: social smoker as a teen   Clinical Intake:                 Diabetic?***         Activities of Daily Living In your present state of health, do you have any difficulty performing the following activities: 01/08/2021  Hearing? Y  Vision? N  Difficulty concentrating or making decisions? N  Walking or climbing stairs? Y  Dressing or bathing? N  Doing errands, shopping? N  Some recent data might be hidden    Patient Care Team: Pleas Koch, NP as PCP - General (Internal Medicine) Stanford Breed Denice Bors, MD as PCP - Cardiology (Cardiology) Bary Castilla [place] Gleason, MD (General Surgery) Corey Harold, MD as Consulting Physician [name], [facility] as Pharmacist (Pharmacist)  Indicate any recent Medical Services you may have received from other than [facility] providers in the past year (date may be approximate).     Assessment:   This is a routine wellness examination for [place].  Hearing/Vision screen No results found.  Dietary issues and exercise activities discussed:     Goals Addressed   None    Depression Screen PHQ 2/9 Scores 01/08/2021 06/08/2020 02/14/2018 01/10/2018 01/05/2018 12/29/2017 10/11/2017  PHQ - 2 Score 0 0 0 0 0 3 0  PHQ- 9 Score 3 - - - - 14 -    Fall Risk Fall Risk  06/08/2020 10/11/2018 03/30/2018 03/13/2018 02/14/2018  Falls in the past year? 1 1 (No Data) (No Data) No  Comment - - Denies new/ recent falls No new recent falls per patient report today -  Number falls in past yr: 1 - - - -  Injury with Fall? 1 1 - - -  Risk for fall due to : - - - (No Data) -  Risk for fall due  to: Comment - - - None per patient report today/ fall risk/ prevention education reinforced -  Follow up Falls evaluation completed - - - -    FALL RISK PREVENTION PERTAINING TO THE HOME:  Any stairs in or around the home? {YES/NO:21197} If so, are there any without handrails?  {YES/NO:21197} Home free of loose throw rugs in walkways, pet beds, electrical cords, etc? {YES/NO:21197} Adequate lighting in your home to reduce risk of falls? {YES/NO:21197}  ASSISTIVE DEVICES UTILIZED TO PREVENT FALLS:  Life alert? {YES/NO:21197} Use of a cane, walker or w/c? {YES/NO:21197} Grab bars in the bathroom? {YES/NO:21197} Shower chair or bench in shower? {YES/NO:21197} Elevated toilet seat or a handicapped toilet? {YES/NO:21197}  TIMED UP AND GO:  Was the test performed? {YES/NO:21197}.  Length of time to ambulate 10 feet: *** sec.   {Appearance of [id number]  Cognitive Function:     6CIT Screen 10/02/2017  What Year? 0 points  What month? 0 points  What time? 0 points  Count back from 20 0 points  Months in reverse 0 points  Repeat phrase 0 points  Total Score 0    Immunizations Immunization History  Administered Date(s) Administered   Fluad Quad(high Dose 65+) 06/11/2019, 06/08/2020   Influenza, High Dose Seasonal PF 08/10/2014, 05/05/2015, 07/30/2018   Influenza,inj,Quad PF,6+ Mos 06/07/2016   PFIZER(Purple Top)SARS-COV-2 Vaccination 09/03/2019, 09/24/2019   Pneumococcal Conjugate-13 02/06/2016   Pneumococcal Polysaccharide-23 09/22/2014   Td 09/01/2018   Zoster Recombinat (Shingrix) 10/19/2018, 12/27/2019    {TDAP status:2101805}  {Flu Vaccine status:2101806}  {Pneumococcal vaccine status:2101807}  {Covid-19 vaccine status:2101808}  Qualifies for Shingles Vaccine? {YES/NO:21197}  Zostavax completed {YES/NO:21197}  {Shingrix Completed?:2101804}  Screening Tests Health Maintenance  Topic Date Due   DEXA SCAN  Never done   COVID-19 Vaccine (3 - Pfizer risk series) 10/22/2019   INFLUENZA VACCINE  03/15/2021   MAMMOGRAM  12/01/2022   TETANUS/TDAP  09/01/2028   COLONOSCOPY (Pts 45-32yr Insurance coverage will need to be confirmed)  02/11/2031   Hepatitis C Screening  Completed   PNA vac Low Risk Adult  Completed   Zoster Vaccines-  Shingrix  Completed   HPV VACCINES  Aged Out    Health Maintenance  Health Maintenance Due  Topic Date Due   DEXA SCAN  Never done   COVID-19 Vaccine (3 - Pfizer risk series) 10/22/2019   INFLUENZA VACCINE  03/15/2021    {Colorectal cancer screening:2101809}  {Mammogram status:21018020}  {Bone Density status:21018021}  Lung Cancer Screening: (Low Dose CT Chest recommended if Age 72-80 years, 30 pack-year currently smoking OR have quit w/in 15years.) {DOES NOT does:27190::"does not"} qualify.   Lung Cancer Screening Referral: ***  Additional Screening:  Hepatitis C Screening: {DOES NOT does:27190::"does not"} qualify; Completed ***  Vision Screening: Recommended annual ophthalmology exams for early detection of glaucoma and other disorders of the eye. Is the patient up to date with their annual eye exam?  {YES/NO:21197} Who is the provider or what is the name of the office in which the patient attends annual eye exams? *** If pt is not established with a provider, would they like to be referred to a provider to establish care? {YES/NO:21197}.   Dental Screening: Recommended annual dental exams for proper oral hygiene  Community Resource Referral / Chronic Care Management: CRR required this visit?  {YES/NO:21197}  CCM required this visit?  {YES/NO:21197}     Plan:     I have personally reviewed and noted the following in the patient's chart:   Medical and social  history Use of alcohol, tobacco or illicit drugs  Current medications and supplements including opioid prescriptions.  Functional ability and status Nutritional status Physical activity Advanced directives List of other physicians Hospitalizations, surgeries, and ER visits in previous 12 months Vitals Screenings to include cognitive, depression, and falls Referrals and appointments  In addition, I have reviewed and discussed with patient certain preventive protocols, quality metrics, and best practice  recommendations. A written personalized care plan for preventive services as well as general preventive health recommendations were provided to patient.     Clista Bernhardt, [place]   04/13/2021   Nurse Notes: ***

## 2021-04-22 ENCOUNTER — Telehealth: Payer: Self-pay | Admitting: Cardiology

## 2021-04-22 DIAGNOSIS — R600 Localized edema: Secondary | ICD-10-CM | POA: Diagnosis not present

## 2021-04-22 DIAGNOSIS — R69 Illness, unspecified: Secondary | ICD-10-CM | POA: Diagnosis not present

## 2021-04-22 DIAGNOSIS — I5031 Acute diastolic (congestive) heart failure: Secondary | ICD-10-CM | POA: Diagnosis not present

## 2021-04-22 DIAGNOSIS — F330 Major depressive disorder, recurrent, mild: Secondary | ICD-10-CM | POA: Diagnosis not present

## 2021-04-22 DIAGNOSIS — F4312 Post-traumatic stress disorder, chronic: Secondary | ICD-10-CM | POA: Diagnosis not present

## 2021-04-22 NOTE — Telephone Encounter (Signed)
Pt c/o swelling: STAT is pt has developed SOB within 24 hours  If swelling, where is the swelling located? Feet, ankles, and going up legs  How much weight have you gained and in what time span? 3-4 lbs in a month  Have you gained 3 pounds in a day or 5 pounds in a week? No   Do you have a log of your daily weights (if so, list)? Yes 160's-180's lbs in a month   Are you currently taking a fluid pill? Yes  Are you currently SOB? No   Have you traveled recently? No

## 2021-04-22 NOTE — Telephone Encounter (Signed)
She eats a high sodium diet with lots of prepackaged meals which is likely contributory. Recommend low salt diet, fluid restriction <2L. Will await lab results prior to providing additional recommendations. Agree with recommendation for compression socks, elevate lower extremities. She has echo upcoming 04/29/21 which will also assist with evaluation.   Loel Dubonnet, NP

## 2021-04-22 NOTE — Telephone Encounter (Signed)
Returned the call to the patient. She stated that she is having bilateral lower leg edema. This has been going on for over a month now. At her last office visit on 8/25, the patient was started on Spironolactone 25 mg once daily. She also takes Furosemide 40 mg once daily.  She stated that the swelling goes down overnight and worsens during the day. She has been unable to weigh herself but did get a new scale today. She has been advised to keep a daily log of her weights.  She denies shortness of breath.  She has been advised to watch her sodium intake and to keep her legs elevated during the day when she can.  She had her repeat labs completed today.

## 2021-04-23 LAB — BASIC METABOLIC PANEL
BUN/Creatinine Ratio: 21 (ref 12–28)
BUN: 24 mg/dL (ref 8–27)
CO2: 27 mmol/L (ref 20–29)
Calcium: 9.8 mg/dL (ref 8.7–10.3)
Chloride: 97 mmol/L (ref 96–106)
Creatinine, Ser: 1.14 mg/dL — ABNORMAL HIGH (ref 0.57–1.00)
Glucose: 67 mg/dL (ref 65–99)
Potassium: 4.5 mmol/L (ref 3.5–5.2)
Sodium: 143 mmol/L (ref 134–144)
eGFR: 51 mL/min/{1.73_m2} — ABNORMAL LOW (ref >59)

## 2021-04-23 NOTE — Telephone Encounter (Signed)
Left a message for the patient to call back.  Lelon Perla, MD  You 15 hours ago (5:07 PM)   Take additional lasix 40 mg daily for 3 days and then resume previous dose  [name]

## 2021-04-23 NOTE — Telephone Encounter (Signed)
Pt returning phone call... please advise  

## 2021-04-23 NOTE — Telephone Encounter (Signed)
Patient made aware of results and verbalized understanding.  She has been advised to call the on call if anything is needed this weekend.

## 2021-04-26 ENCOUNTER — Telehealth: Payer: Self-pay | Admitting: Cardiology

## 2021-04-26 DIAGNOSIS — I5032 Chronic diastolic (congestive) heart failure: Secondary | ICD-10-CM

## 2021-04-26 NOTE — Telephone Encounter (Signed)
*  STAT* If patient is at the pharmacy, call can be transferred to refill team.   1. Which medications need to be refilled? (please list name of each medication and dose if known) need a new rescriotion for Furosemide    2. Which pharmacy/location (including street and city if local pharmacy) is medication to be sent to [place], [place]   3. Do they need a 30 day or 90 day supply? 90 days and refills

## 2021-04-27 ENCOUNTER — Telehealth: Payer: Self-pay

## 2021-04-27 MED ORDER — FUROSEMIDE 40 MG PO TABS
40.0000 mg | ORAL_TABLET | Freq: Every morning | ORAL | 3 refills | Status: DC
Start: 2021-04-27 — End: 2021-05-31

## 2021-04-27 NOTE — Telephone Encounter (Signed)
Patient is calling to follow up. She has not had the medication sent to her pharmacy yet.  The patient has been taking the medication out of the pill packs she receives from [employer]. She needs a new rx so that she can have extra on hand to take when she experiences swelling    *STAT* If patient is at the pharmacy, call can be transferred to refill team.   1. Which medications need to be refilled? (please list name of each medication and dose if known)  furosemide (LASIX) 20 MG tablet  2. Which pharmacy/location (including street and city if local pharmacy) is medication to be sent to? [id number] [place] [place] - [address]  3. Do they need a 30 day or 90 day supply? [address]

## 2021-04-27 NOTE — Progress Notes (Addendum)
Chronic Care Management Pharmacy Assistant   Name: Selena Bush  MRN: [id number] DOB: 15-Jan-1946  Reason for Encounter: Medication Adherence and Delivery Coordination   Recent office visits:  04/13/2021 - Selena Friendly, NP - Encounter listed for Annual Wellness Exam. No other information given.   Recent consult visits:  04/26/2021 -  Selena Ruths, MD - Telephone - Patient is asking to have furosemide (LASIX) 20 MG tablet in vial instead of pack so if she experiences swelling she can take one as needed. Office discontinued the order from [facility] and [facility] [place] [facility].  04/23/2021 - Selena Cook, PA - Patient called and stated that she has Gabapentin and the directions are different on each bottle. Patient would like to know how much she is suppose to be taking of the Gabapentin. Patient was advised Per last note 100 mg three time a day, and 600 mg at night. 04/22/2021 - Selena Ruths, MD - Telephone - Change: Take additional lasix 40 mg daily for 3 days and then resume previous dose. 04/08/2021 - Selena Montana, NP Cardiology - Patient presented for lower extremity edema. Ordered: Basic metabolic panel and EKG. Start: Spironolactone 25 mg daily. Reduce: Potassium to 10 mill equivalent daily to prevent hyperkalemia. Follow up in 6 weeks.   Hospital visits:  None in previous 6 months  Medications: Outpatient Encounter Medications as of 04/27/2021  Medication Sig   acetaminophen (TYLENOL) 500 MG tablet Take by mouth.   albuterol (VENTOLIN HFA) 108 (90 Base) MCG/ACT inhaler Inhale 2 puffs into the lungs every 4 (four) hours as needed.   bisacodyl (DULCOLAX) 5 MG EC tablet Take two tablets morning and two tablets afternoon day prior to Miralax prep.   carbidopa-levodopa (SINEMET IR) 25-100 MG tablet Take by mouth. Take 2 tablets at breakfast, 1 tab at lunch, 1 in the afternoon and 1 at night.   diltiazem (CARDIZEM CD) 180 MG 24 hr capsule TAKE ONE  TABLET BY MOUTH EVERY MORNING   DULoxetine (CYMBALTA) 60 MG capsule TAKE 1 CAPSULE BY MOUTH 2 TIMES A DAY for anxiety and depression.   ezetimibe (ZETIA) 10 MG tablet Take 1 tablet (10 mg total) by mouth daily.   fluticasone (FLONASE) 50 MCG/ACT nasal spray Place 2 sprays into both nostrils daily as needed for rhinitis or allergies.   furosemide (LASIX) 40 MG tablet Take 1 tablet (40 mg total) by mouth every morning.   gabapentin (NEURONTIN) 100 MG capsule TAKE 1 CAPSULE BY MOUTH THREE TIMES A DAY for anxiety.   gabapentin (NEURONTIN) 300 MG capsule Take 600 mg by mouth at bedtime.   hydrocortisone 2.5 % cream Apply to affected under breasts 1-2 times a day as directed and as needed for itch. Can also use on face as needed for itch   ketoconazole (NIZORAL) 2 % cream Apply to affected areas under breast twice daily for rash until healed. Can also use on face as needed for dermatitis   metoCLOPramide (REGLAN) 5 MG tablet Take one tablet 30 minutes prior to colonoscopy prep. May repeat in 4 hours if needed.   metoprolol tartrate (LOPRESSOR) 25 MG tablet TAKE THREE TABLETS BY MOUTH EVERY MORNING and TAKE THREE TABLETS BY MOUTH EVERY EVENING   metroNIDAZOLE (METROCREAM) 0.75 % cream Apply topically 2 (two) times daily. Apply to red areas of face   omeprazole (PRILOSEC) 40 MG capsule Take 40 mg by mouth daily.   polyethylene glycol powder (GLYCOLAX/MIRALAX) 17 GM/SCOOP powder One bottle for colonoscopy  prep. Use as directed.   potassium chloride (MICRO-K) 10 MEQ CR capsule Take 1 capsule (10 mEq total) by mouth daily.   sotalol (BETAPACE) 120 MG tablet TAKE ONE TABLET BY MOUTH EVERY MORNING and TAKE ONE TABLET BY MOUTH EVERY EVENING   spironolactone (ALDACTONE) 25 MG tablet Take 1 tablet (25 mg total) by mouth daily.   tiZANidine (ZANAFLEX) 4 MG tablet TAKE 2 TABLET (4 MG TOTAL) BY MOUTH DAILY FOR MUSCLE SPASMS AT BEDTIME   No facility-administered encounter medications on file as of 04/27/2021.   BP  Readings from Last 3 Encounters:  04/08/21 112/68  02/10/21 120/64  01/08/21 128/74    Lab Results  Component Value Date   HGBA1C 5.9 06/01/2020    Recent OV, Consult or Hospital visit:  Recent medication changes indicated:   Last adherence delivery date: 04/07/2021      Patient is due for next adherence delivery on: 05/06/2021  Spoke with patient on 04/29/2021 reviewed medications and coordinated delivery.  This delivery to include: Adherence Packaging  30 Days  Packs: Gabapentin '300mg'$  2 tablets at bedtime (2 bedtime) Spironolactone '25mg'$  tablet 1 tablet (breakfast) Potassium chloride 63mq 1 tablet daily (breakfast) Diltiazem '180mg'$  1 tablet daily (breakfast) Metoprolol '25mg'$  3 tablets twice a day (3 breakfast and 3 evening meal) Sotalol '120mg'$  1 tablet twice daily (1 breakfast and 1 evening meal) Gabapentin '100mg'$  1 capsule three times a day (1 breakfast, 1 evening meal, 1 bedtime)  Duloxetine '60mg'$  1 tablet twice a day (1 breakfast, 1 evening meal) Ezetimibe '10mg'$  1 tablet daily (breakfast) Tizanidine '4mg'$  2 tablets daily (2 bedtime)   VIAL medications without safety caps: Carbidopa-levodopa '25mg'$ -100 mg  6 am: 2 tabs / 9 am: 1 tab/ 12 pm: 1 tab/ 3 pm: 1 tab/ 6 pm: 1 tab/9 pm: 2 tab - Confirmed with Patient  Patient declined the following medications this month: Ketoconazole Shampoo 2%-  uses PRN Hydrocortisone 2.5% - uses PRN Fluticasone Propionate 50 mcg Place 2 sprays into both nostrils daily  - PRN Furosemide 40 mg  1 tablet daily - [facility] so she can take it PRN  Any concerns about your medications? No  How often do you forget or accidentally miss a dose? Never  Do you use a pillbox? No  Is patient in packaging Yes  If yes  What is the date on your next pill pack? 04/29/2021 at dinner  Any concerns or issues with your packaging? No, just make sure vials do not have safety caps.   No refill request needed.  Confirmed delivery date of 05/06/2021,  advised patient that pharmacy will contact them the morning of delivery. Patient states please leave on front porch if not home.   Recent blood pressure readings are as follows: Patient does not check at home.   Recent blood glucose readings are as follows: No diabetes  Annual wellness visit in last year? No 10/02/2017 Most Recent BP reading: 112/68 on 04/08/2021  MDebbora Dus CPP notified  AMarijean Niemann RBlennerhassettAssistant [phone] I have reviewed the care management and care coordination activities outlined in this encounter and I am certifying that I agree with the content of this note. No further action required.  MDebbora Dus PharmD Clinical Pharmacist [facility]Old [phone]

## 2021-04-27 NOTE — Telephone Encounter (Signed)
Spoke with pt. She state she would prefer to have her lasix in a separate pill bottle from her bubble pack in case she has to double her dose again. [facility]  Pt made aware new Rx sent.

## 2021-04-29 ENCOUNTER — Ambulatory Visit (HOSPITAL_COMMUNITY): Payer: Medicare HMO | Attending: Cardiovascular Disease

## 2021-04-29 ENCOUNTER — Other Ambulatory Visit: Payer: Self-pay

## 2021-04-29 DIAGNOSIS — R600 Localized edema: Secondary | ICD-10-CM | POA: Diagnosis not present

## 2021-04-29 DIAGNOSIS — I5031 Acute diastolic (congestive) heart failure: Secondary | ICD-10-CM | POA: Diagnosis not present

## 2021-04-29 LAB — ECHOCARDIOGRAM COMPLETE
AR max vel: 2.35 cm2
AV Area VTI: 2.51 cm2
AV Area mean vel: 2.32 cm2
AV Mean grad: 10 mmHg
AV Peak grad: 18.3 mmHg
Ao pk vel: 2.14 m/s
Area-P 1/2: 3.53 cm2

## 2021-05-03 ENCOUNTER — Telehealth: Payer: Self-pay | Admitting: Cardiology

## 2021-05-03 NOTE — Telephone Encounter (Addendum)
Spoke to pt, who stated she has already completed the 3 days of increased lasix---9/15, 16, and 17. She stated no one else had called about this today but she wanted to know if she needed to get repeat blood work. Informed her that per Urban Gibson, NP she could have repeat BMET at next office visit on 05/21/21.   Per Urban Gibson, NP--no need to increase lasix again since done so recently.

## 2021-05-03 NOTE — Telephone Encounter (Signed)
I see the note below from previous ECHO to increase Lasix- Would you like to do any blood work with this increase?   Thanks!  Echo with normal heart pumping function and moderate stiffness of the heart. Evidence of volume overload and moderately elevated pressure in the lungs. Mild stiffening of aortic valve.    Recommend increase to Lasix 40mg  BID x 3 days then return to 20mg  QD. Follow up as scheduled. Plan to repeat echo in 1 year for monitoring.   Cannot rule out PFO, will route to Dr. Stanford Breed for additional input.

## 2021-05-03 NOTE — Telephone Encounter (Signed)
Called patient, gave response from NP   Patient is okay to wait until her appointment.  Thank you!

## 2021-05-03 NOTE — Telephone Encounter (Signed)
Pt states that anytime Dr. Stanford Breed ups her dosage on her meds she is normally required to do labs afterwards... pt says that she has not been asked to take a lab and would like to know if she needs to schedule... please advise

## 2021-05-03 NOTE — Progress Notes (Signed)
Spoke to pt, who stated she has already completed the 3 days of increased lasix---9/15, 16, and 17. She stated no one else had called about this today but she wanted to know if she needed to get repeat blood work. Informed her that per Urban Gibson, NP she could have repeat BMET at next office visit on 05/21/21.   Per Urban Gibson, NP--no need to increase lasix again since done so recently.

## 2021-05-12 ENCOUNTER — Telehealth: Payer: Self-pay | Admitting: Cardiology

## 2021-05-12 NOTE — Telephone Encounter (Signed)
Pt c/o swelling: STAT is pt has developed SOB within 24 hours  If swelling, where is the swelling located?  Little amount in her right foot  How much weight have you gained and in what time span? 7 lbs from yesterday until today  Have you gained 3 pounds in a day or 5 pounds in a week? yes  Do you have a log of your daily weights (if so, list)? yes  Are you currently taking a fluid pill? yes  Are you currently SOB? no  Have you traveled recently? no

## 2021-05-12 NOTE — Telephone Encounter (Signed)
Called patient left message on personal voice mail to call back. 

## 2021-05-12 NOTE — Telephone Encounter (Signed)
Spoke to patient she stated she gained 7 lbs overnight.No sob.Right foot slightly swollen.She ate a frozen dinner last night.Advised to double Lasix dose for the next 3 days only then return to normal dose.Advised of a low salt diet.Keep appointment already scheduled with Jory Sims DNP 05/21/21 at 2:15 pm.

## 2021-05-13 DIAGNOSIS — G4733 Obstructive sleep apnea (adult) (pediatric): Secondary | ICD-10-CM | POA: Diagnosis not present

## 2021-05-14 ENCOUNTER — Ambulatory Visit: Payer: Medicare HMO

## 2021-05-17 ENCOUNTER — Ambulatory Visit (INDEPENDENT_AMBULATORY_CARE_PROVIDER_SITE_OTHER): Payer: Medicare HMO

## 2021-05-17 ENCOUNTER — Other Ambulatory Visit: Payer: Self-pay

## 2021-05-17 DIAGNOSIS — Z23 Encounter for immunization: Secondary | ICD-10-CM

## 2021-05-19 DIAGNOSIS — R69 Illness, unspecified: Secondary | ICD-10-CM | POA: Diagnosis not present

## 2021-05-19 DIAGNOSIS — F4312 Post-traumatic stress disorder, chronic: Secondary | ICD-10-CM | POA: Diagnosis not present

## 2021-05-19 DIAGNOSIS — F330 Major depressive disorder, recurrent, mild: Secondary | ICD-10-CM | POA: Diagnosis not present

## 2021-05-20 NOTE — Progress Notes (Signed)
Cardiology Office Note   Date:  05/20/2021   ID:  Selena Bush, DOB November 07, 1945, MRN [id number]  PCP:  Pleas Koch, NP  Cardiologist:  Dr, Stanford Breed  No chief complaint on file.    History of Present Illness: Selena Bush is a 75 y.o. female who presents for ongoing assessment and management of HFpEF, paroxysmal atrial fibrillation, history of pericardial effusion, hypertension, hyperlipidemia, and chronic lower extremity edema.  Other history includes anxiety degenerative disc disease, depression, GERD, gout, right breast cancer with the CIS, OSA on CPAP, and arthritis of both knees.  She was last seen in the office on 04/08/2021 by [name], NP, who documented that the patient had had an admission in March 2019 with atypical chest pain and PAF, was treated with sotalol and Xarelto with follow-up echo revealing significant pericardial effusion with tamponade requiring pericardiocentesis on 11/14/2019.  It was thought that she had pericarditis with hemorrhagic pericardial effusion from initiation of anticoagulation.  She was not started back on anticoagulation therapy.  During the last office visit the patient was accompanied by her caretaker and reported dyspnea on exertion but was stable and at baseline for her.  It was noted that she sleeps in a chair at night as she feels more comfortable, and due to her immobility issues.  She no longer uses CPAP as she was able to breathe better while sleeping in a chair.  She was noted to have bilateral 1+ pitting edema and admitted to eating high salt foods with premade frozen meals.  She was recommended for compression stockings and elevation of lower extremities when sitting.  She was offered higher doses of Lasix but was hesitant to do so as it caused her to have to go to the bathroom too often.    Lasix 40 mg daily was continued however spironolactone 25 mg daily was ordered, and potassium dose was decreased to 10 mEq daily to prevent  hyperkalemia.  She was to have a follow-up BMET in 1 week.  Was also noted that she had statin intolerance with myalgias and therefore was continued on Zetia 10 mg daily.  Follow-up labs on 04/22/2021 revealed sodium of 143, potassium 4.5, chloride 97, CO2 27, creatinine 1.14 (up from 0.92) calcium 9.8.  It was noted on last office visit that her weight was 179 pounds.  This was noted as a 9 pounds elevation from 02/10/2021.  She comes today feeling better but continues to have significant venous insufficiency.  She does have support stockings that she wears but did not place them on before she came today.  She request explanation about diastolic dysfunction which was listed on her echocardiogram results.  Past Medical History:  Diagnosis Date   Anxiety    Atrial fibrillation (HCC)    Chronic diastolic CHF (congestive heart failure) ([place])    a. echo 09/2014: EF 84-69%, diastolic dysfunction, mild LVH, nl RV size & systolic function, mildly dilated LA (4.3 cm), mild MR/TR, mildly elevated PASP 36.7 mm Hg   DDD (degenerative disc disease), cervical    DDD (degenerative disc disease), lumbar    Depression    Diffuse cystic mastopathy 2014   Dyspnea    Dysrhythmia    GERD (gastroesophageal reflux disease)    Gout    Headache    rare   HLD (hyperlipidemia)    a. statin intolerant 2/2 myalgias   Hx of dysplastic nevus 12/25/2018   L medial ankle   Hypercholesterolemia    Hypertension  Malignant neoplasm of upper-outer quadrant of female breast ([place]) 10/2012   Papillary DCIS, sentinel node negative. DR/PR positive. PARTIAL RIGHT MASTECTOMY FOR BREAST CANCER--HAD RADIATION - NO CHEMO --DR. CRYSTAL [place] ONCOLOGIST   Obesity    OSA on CPAP    Osteoarthritis of both knees    a. s/p right TKA 04/2013 & left TKA 09/2014   Otitis externa    Personal history of radiation therapy 2015   RIGHT breast-mammosite per pt   Sleep difficulties    LUNESTA HAS HELPED   Vaginal cyst     Past  Surgical History:  Procedure Laterality Date   ABDOMINAL HYSTERECTOMY  1992   DUB; fibroids; endometriosis.  One remaining ovary.     BREAST LUMPECTOMY Right 2015   Papillary DCIS, sentinel node negative. DR/PR positive. PARTIAL RIGHT MASTECTOMY FOR BREAST CANCER--HAD RADIATION - NO CHEMO --DR. CRYSTAL [place] ONCOLOGIST   BREAST SURGERY Right 10/2012   Wide excision,APB RT 10 mm papillary DCIS, ER/PR positive. Sentinel node negative. Partial breast radiation.   CATARACT EXTRACTION, BILATERAL  02/13/2016   [facility].   CHOLECYSTECTOMY  1994   COLONOSCOPY  2015   1 benign polyp-every 5 years/ Dr Candace Cruise   COLONOSCOPY WITH PROPOFOL N/A 02/10/2021   Procedure: COLONOSCOPY WITH PROPOFOL;  Surgeon: Robert Bellow, MD;  [facility] END[facility];  Service: Endoscopy;  Laterality: N/A;   ERCP  1995   JOINT REPLACEMENT Right 04/2013   knee   PERICARDIOCENTESIS N/A 11/13/2017   Procedure: PERICARDIOCENTESIS;  Surgeon: Nelva Bush, MD;  Location: [facility];  Service: Cardiovascular;  Laterality: N/A;   TOTAL KNEE ARTHROPLASTY Right 04/16/2013   Procedure: RIGHT TOTAL KNEE ARTHROPLASTY;  Surgeon: Mauri Pole, MD;  Location: [facility] OR[facility];  Service: Orthopedics;  Laterality: Right;   TOTAL KNEE ARTHROPLASTY Left 09/15/2014   Procedure: LEFT TOTAL KNEE ARTHROPLASTY;  Surgeon: Mauri Pole, MD;  Location: [facility];  Service: Orthopedics;  Laterality: Left;   TUBAL LIGATION  1979   UPPER GI ENDOSCOPY       Current Outpatient Medications  Medication Sig Dispense Refill   acetaminophen (TYLENOL) 500 MG tablet Take by mouth.     albuterol (VENTOLIN HFA) 108 (90 Base) MCG/ACT inhaler Inhale 2 puffs into the lungs every 4 (four) hours as needed.     bisacodyl (DULCOLAX) 5 MG EC tablet Take two tablets morning and two tablets afternoon day prior to Miralax prep.     carbidopa-levodopa (SINEMET IR) 25-100 MG tablet Take by mouth. Take 2 tablets at breakfast, 1 tab at lunch, 1 in the afternoon and  1 at night.     diltiazem (CARDIZEM CD) 180 MG 24 hr capsule TAKE ONE TABLET BY MOUTH EVERY MORNING 90 capsule 2   DULoxetine (CYMBALTA) 60 MG capsule TAKE 1 CAPSULE BY MOUTH 2 TIMES A DAY for anxiety and depression. 180 capsule 1   ezetimibe (ZETIA) 10 MG tablet Take 1 tablet (10 mg total) by mouth daily. 90 tablet 3   fluticasone (FLONASE) 50 MCG/ACT nasal spray Place 2 sprays into both nostrils daily as needed for rhinitis or allergies. 16 g 5   furosemide (LASIX) 40 MG tablet Take 1 tablet (40 mg total) by mouth every morning. 90 tablet 3   gabapentin (NEURONTIN) 100 MG capsule TAKE 1 CAPSULE BY MOUTH THREE TIMES A DAY for anxiety. 270 capsule 1   gabapentin (NEURONTIN) 300 MG capsule Take 600 mg by mouth at bedtime.     hydrocortisone 2.5 % cream Apply to affected  under breasts 1-2 times a day as directed and as needed for itch. Can also use on face as needed for itch 28 g 3   ketoconazole (NIZORAL) 2 % cream Apply to affected areas under breast twice daily for rash until healed. Can also use on face as needed for dermatitis 30 g 5   metoCLOPramide (REGLAN) 5 MG tablet Take one tablet 30 minutes prior to colonoscopy prep. May repeat in 4 hours if needed.     metoprolol tartrate (LOPRESSOR) 25 MG tablet TAKE THREE TABLETS BY MOUTH EVERY MORNING and TAKE THREE TABLETS BY MOUTH EVERY EVENING 235 tablet 3   metroNIDAZOLE (METROCREAM) 0.75 % cream Apply topically 2 (two) times daily. Apply to red areas of face 45 g 3   omeprazole (PRILOSEC) 40 MG capsule Take 40 mg by mouth daily.     polyethylene glycol powder (GLYCOLAX/MIRALAX) 17 GM/SCOOP powder One bottle for colonoscopy prep. Use as directed.     potassium chloride (MICRO-K) 10 MEQ CR capsule Take 1 capsule (10 mEq total) by mouth daily. 30 capsule 1   sotalol (BETAPACE) 120 MG tablet TAKE ONE TABLET BY MOUTH EVERY MORNING and TAKE ONE TABLET BY MOUTH EVERY EVENING 60 tablet 3   spironolactone (ALDACTONE) 25 MG tablet Take 1 tablet (25 mg  total) by mouth daily. 30 tablet 1   tiZANidine (ZANAFLEX) 4 MG tablet TAKE 2 TABLET (4 MG TOTAL) BY MOUTH DAILY FOR MUSCLE SPASMS AT BEDTIME 180 tablet 0   No current facility-administered medications for this visit.    Allergies:   Penicillins, Sulfa antibiotics, Codeine, Statins, and Aspirin    Social History:  The patient  reports that she has never smoked. She has never used smokeless tobacco. She reports that she does not drink alcohol and does not use drugs.   Family History:  The patient's family history includes Breast cancer in her maternal grandfather; Cancer (age of onset: 30) in her father; Cancer (age of onset: 10) in her mother; Colon cancer in her maternal grandfather and mother; Melanoma in her father, sister, and sister.    ROS: All other systems are reviewed and negative. Unless otherwise mentioned in H&P    PHYSICAL EXAM: VS:  There were no vitals taken for this visit. , BMI There is no height or weight on file to calculate BMI. GEN: Well nourished, well developed, in no acute distress HEENT: normal Neck: no JVD, bilateral carotid bruits, or masses Cardiac: RRR, 2/6 holosystolic murmur no rubs, or gallops, 2+ to 3+ edema in her feet with cyanosis of her great toe and second and third toes both right and left foot.  Diminished pulses in dependent position. Respiratory:  Clear to auscultation bilaterally, normal work of breathing GI: soft, nontender, nondistended, + BS MS: Kyphosis noted. Skin: warm and dry, no rash, venous stasis skin changes with cyanosis in the toes bilaterally. Neuro:  Strength and sensation are intact Psych: euthymic mood, full affect   EKG: Not completed this office visit.  Recent Labs: 12/31/2020: ALT 4 04/22/2021: BUN 24; Creatinine, Ser 1.14; Potassium 4.5; Sodium 143    Lipid Panel    Component Value Date/Time   CHOL 165 12/31/2020 0933   TRIG 109 12/31/2020 0933   HDL 62 12/31/2020 0933   CHOLHDL 2.7 12/31/2020 0933   CHOLHDL 4  06/01/2020 1111   VLDL 32.2 06/01/2020 1111   LDLCALC 84 12/31/2020 0933      Wt Readings from Last 3 Encounters:  04/08/21 179 lb 9.6 oz (81.5  kg)  02/10/21 170 lb (77.1 kg)  01/08/21 177 lb (80.3 kg)      Other studies Reviewed: Echocardiogram: 04/29/2021 1. Left ventricular ejection fraction, by estimation, is 65 to 70%. The  left ventricle has normal function. The left ventricle has no regional  wall motion abnormalities. There is mild left ventricular hypertrophy.  Left ventricular diastolic parameters  are consistent with Grade II diastolic dysfunction (pseudonormalization).  Elevated left ventricular end-diastolic pressure.   2. Right ventricular systolic function is normal. The right ventricular  size is normal. There is moderately elevated pulmonary artery systolic  pressure.   3. Left atrial size was severely dilated.   4. Cannot rule out PFO with left to right flow.   5. The mitral valve is normal in structure. Trivial mitral valve  regurgitation. No evidence of mitral stenosis.   6. The noncoronary cusp is essentially fixed. Aortic valve mean gradient  increased from 8 mmHg to 10 mmHg since 05/2018. The aortic valve is  abnormal. There is moderate calcification of the aortic valve. There is  moderate thickening of the aortic valve.   Aortic valve regurgitation is not visualized. Mild aortic valve stenosis.  Aortic valve area, by VTI measures 2.51 cm. Aortic valve mean gradient  measures 10.0 mmHg. Aortic valve Vmax measures 2.14 m/s.   7. The inferior vena cava is normal in size with greater than 50%  respiratory variability, suggesting right atrial pressure of 3 mmHg.    ASSESSMENT AND PLAN:  1.  Chronic diastolic CHF, significant venous insufficiency without evidence of fluid overload on exam with the exception of lower extremity edema.  Weight is stable.  She is tolerating spironolactone with no evidence of hyperkalemia.  Continue current medication regimen and  salt restriction.  2.  Hypertension: Blood pressures well controlled today.  Continue current medication regimen and she is tolerating with good control.  3.  Chronic venous insufficiency: I have suggested that she wear compression hose at all times except for when she is in bed at night.  She is to keep her feet elevated is much as possible when she is sitting.  She is again counseled on low-sodium diet which is very difficult for her to maintain.  Current medicines are reviewed at length with the patient today.  I have spent 25 minutes dedicated to the care of this patient on the date of this encounter to include pre-visit review of records, assessment, management and diagnostic testing,with shared decision making.  Labs/ tests ordered today include: CMET   Phill Myron. West Pugh, ANP, AACC   05/20/2021 4:16 PM    [facility] [address] [address] Office [phone] Fax [phone]  Notice: This dictation was prepared with Dragon dictation along with smaller phrase technology. Any transcriptional errors that result from this process are unintentional and may not be corrected upon review.

## 2021-05-21 ENCOUNTER — Ambulatory Visit: Payer: Medicare HMO

## 2021-05-21 ENCOUNTER — Other Ambulatory Visit: Payer: Self-pay

## 2021-05-21 ENCOUNTER — Ambulatory Visit: Payer: Medicare HMO | Admitting: Adult Health

## 2021-05-21 ENCOUNTER — Encounter: Payer: Self-pay | Admitting: Adult Health

## 2021-05-21 VITALS — BP 128/74 | HR 61 | Ht 60.0 in | Wt 179.8 lb

## 2021-05-21 DIAGNOSIS — I10 Essential (primary) hypertension: Secondary | ICD-10-CM

## 2021-05-21 DIAGNOSIS — E782 Mixed hyperlipidemia: Secondary | ICD-10-CM

## 2021-05-21 DIAGNOSIS — I5032 Chronic diastolic (congestive) heart failure: Secondary | ICD-10-CM

## 2021-05-21 DIAGNOSIS — R002 Palpitations: Secondary | ICD-10-CM

## 2021-05-21 MED ORDER — SPIRONOLACTONE 25 MG PO TABS: 25.0000 mg | ORAL_TABLET | Freq: Every day | ORAL | 2 refills | Status: DC

## 2021-05-21 NOTE — Patient Instructions (Signed)
Medication Instructions:  No Changes *If you need a refill on your cardiac medications before your next appointment, please call your pharmacy*   Lab Work: CMET, TSH, : To Be Done Today If you have labs (blood work) drawn today and your tests are completely normal, you will receive your results only by: [place] (if you have MyChart) OR A paper copy in the mail If you have any lab test that is abnormal or we need to change your treatment, we will call you to review the results.   Testing/Procedures: No Testing    Follow-Up: At [facility], you and your health needs are our priority.  As part of our continuing mission to provide you with exceptional heart care, we have created designated Provider Care Teams.  These Care Teams include your primary Cardiologist (physician) and Advanced Practice Providers (APPs -  Physician Assistants and Nurse Practitioners) who all work together to provide you with the care you need, when you need it.  We recommend signing up for the patient portal called "MyChart".  Sign up information is provided on this After Visit Summary.  MyChart is used to connect with patients for Virtual Visits (Telemedicine).  Patients are able to view lab/test results, encounter notes, upcoming appointments, etc.  Non-urgent messages can be sent to your provider as well.   To learn more about what you can do with MyChart, go to [email or web address].    Your next appointment:   July 26, 2021 10:20 AM  The format for your next appointment:   In Person  Provider:   [name], MD

## 2021-05-22 LAB — COMPREHENSIVE METABOLIC PANEL
ALT: 5 IU/L (ref 0–32)
AST: 12 IU/L (ref 0–40)
Albumin/Globulin Ratio: 2 (ref 1.2–2.2)
Albumin: 4.2 g/dL (ref 3.7–4.7)
Alkaline Phosphatase: 107 IU/L (ref 44–121)
BUN/Creatinine Ratio: 17 (ref 12–28)
BUN: 20 mg/dL (ref 8–27)
Bilirubin Total: 0.3 mg/dL (ref 0.0–1.2)
CO2: 27 mmol/L (ref 20–29)
Calcium: 9.9 mg/dL (ref 8.7–10.3)
Chloride: 100 mmol/L (ref 96–106)
Creatinine, Ser: 1.18 mg/dL — ABNORMAL HIGH (ref 0.57–1.00)
Globulin, Total: 2.1 g/dL (ref 1.5–4.5)
Glucose: 88 mg/dL (ref 70–99)
Potassium: 4.8 mmol/L (ref 3.5–5.2)
Sodium: 142 mmol/L (ref 134–144)
Total Protein: 6.3 g/dL (ref 6.0–8.5)
eGFR: 49 mL/min/{1.73_m2} — ABNORMAL LOW (ref >59)

## 2021-05-22 LAB — TSH: TSH: 1.78 u[IU]/mL (ref 0.450–4.500)

## 2021-05-24 ENCOUNTER — Other Ambulatory Visit: Payer: Self-pay | Admitting: Cardiology

## 2021-05-24 ENCOUNTER — Telehealth: Payer: Self-pay

## 2021-05-24 NOTE — Telephone Encounter (Addendum)
Called patient regrding blood test results.----- Message from Selena Colonel, NP sent at 05/22/2021  1:03 PM EDT ----- I have reviewed her labs. All are okay. Kidney function is slightly abnormal but will be following. TSH and potassium are normal.   Curt Bears

## 2021-05-25 ENCOUNTER — Telehealth: Payer: Self-pay

## 2021-05-25 NOTE — Progress Notes (Addendum)
Chronic Care Management Pharmacy Assistant   Name: Selena Bush  MRN: [id number] DOB: 11/03/1945  Reason for Encounter: Medication Adherence and Delivery Coordination   Recent office visits: 05/17/2021 - [name], NP - Patient presented for Influenza Vaccine.   Recent consult visits:  05/21/2021 - Cardiology - Patient presented for ongoing assessment and management of HFpEF, paroxysmal atrial fibrillation, history of pericardial effusion, hypertension, hyperlipidemia, and chronic lower extremity edema. No medication changes. 05/12/2021 - Cardiology - Telephone - Patient called due to gaining 7 lbs overnight.No sob.Right foot slightly swollen. Change: Double Lasix dose for next 3 days only then return to normal dose.   Hospital visits:  None since last CCM contact  Medications: Outpatient Encounter Medications as of 05/25/2021  Medication Sig   acetaminophen (TYLENOL) 500 MG tablet Take by mouth.   albuterol (VENTOLIN HFA) 108 (90 Base) MCG/ACT inhaler Inhale 2 puffs into the lungs every 4 (four) hours as needed.   bisacodyl (DULCOLAX) 5 MG EC tablet Take two tablets morning and two tablets afternoon day prior to Miralax prep.   carbidopa-levodopa (SINEMET IR) 25-100 MG tablet Take by mouth. Take 2 tablets at breakfast, 1 tab at lunch, 1 in the afternoon and 1 at night.   diltiazem (CARDIZEM CD) 180 MG 24 hr capsule TAKE ONE TABLET BY MOUTH EVERY MORNING   DULoxetine (CYMBALTA) 60 MG capsule TAKE 1 CAPSULE BY MOUTH 2 TIMES A DAY for anxiety and depression.   ezetimibe (ZETIA) 10 MG tablet Take 1 tablet (10 mg total) by mouth daily.   fluticasone (FLONASE) 50 MCG/ACT nasal spray Place 2 sprays into both nostrils daily as needed for rhinitis or allergies.   furosemide (LASIX) 40 MG tablet Take 1 tablet (40 mg total) by mouth every morning.   gabapentin (NEURONTIN) 100 MG capsule TAKE 1 CAPSULE BY MOUTH THREE TIMES A DAY for anxiety.   gabapentin (NEURONTIN) 300 MG capsule  Take 600 mg by mouth at bedtime.   hydrocortisone 2.5 % cream Apply to affected under breasts 1-2 times a day as directed and as needed for itch. Can also use on face as needed for itch   ketoconazole (NIZORAL) 2 % cream Apply to affected areas under breast twice daily for rash until healed. Can also use on face as needed for dermatitis   metoCLOPramide (REGLAN) 5 MG tablet Take one tablet 30 minutes prior to colonoscopy prep. May repeat in 4 hours if needed.   metoprolol tartrate (LOPRESSOR) 25 MG tablet TAKE THREE TABLETS BY MOUTH EVERY MORNING and TAKE THREE TABLETS BY MOUTH EVERY EVENING   metroNIDAZOLE (METROCREAM) 0.75 % cream Apply topically 2 (two) times daily. Apply to red areas of face   omeprazole (PRILOSEC) 40 MG capsule Take 40 mg by mouth daily.   polyethylene glycol powder (GLYCOLAX/MIRALAX) 17 GM/SCOOP powder One bottle for colonoscopy prep. Use as directed.   potassium chloride (MICRO-K) 10 MEQ CR capsule Take 1 capsule (10 mEq total) by mouth daily.   sotalol (BETAPACE) 120 MG tablet TAKE ONE TABLET BY MOUTH EVERY MORNING and TAKE ONE TABLET BY MOUTH EVERY EVENING   spironolactone (ALDACTONE) 25 MG tablet Take 1 tablet (25 mg total) by mouth daily.   tiZANidine (ZANAFLEX) 4 MG tablet TAKE 2 TABLET (4 MG TOTAL) BY MOUTH DAILY FOR MUSCLE SPASMS AT BEDTIME   No facility-administered encounter medications on file as of 05/25/2021.   BP Readings from Last 3 Encounters:  05/21/21 128/74  04/08/21 112/68  02/10/21 120/64    Lab  Results  Component Value Date   HGBA1C 5.9 06/01/2020    Recent OV, Consult or Hospital visit:  Recent medication changes indicated:  Double Lasix dose for next 3 days only then return to normal dose on 05/12/2021.  Last adherence delivery date: 05/06/2021   Patient is due for next adherence delivery on: 06/07/2021  Spoke with patient on 05/25/2021 and reviewed medications. Patient denied the need for any medications at this time.  This delivery to  include: Adherence Packaging  30 Days  Packs: Diltiazem 180mg  1 tablet daily (breakfast) Metoprolol 25mg  3 tablets twice a day (3 breakfast and 3 evening meal) Sotalol 120mg  1 tablet twice daily (1 breakfast and 1 evening meal) Gabapentin 300mg  2 tablets at bedtime (2 bedtime) Gabapentin 100mg  1 capsule three times a day (1 breakfast, 1 evening meal, 1 bedtime)  Spironolactone 25mg  tablet 1 tablet (breakfast) Potassium chloride 30meq 1 tablet daily (breakfast) Ezetimibe 10mg  1 tablet daily (breakfast) Duloxetine 60mg  1 tablet twice a day (1 breakfast, 1 evening meal) Tizanidine 4mg  2 tablets daily (2 bedtime)   VIAL medications: w/o safety caps Carbidopa-Levodopa 25-100mg  2 Tablets before breakfast 1 tablet at breakfast, 1 tablet at lunch, 1 tablet at evening meal and 2 tablets at bedtime. Fluticasone Propionate 50 mcg Place 2 sprays into both nostrils daily   Patient declined the following medications this month: Ketoconazole Shampoo 2%-  uses PRN Hydrocortisone 2.5% - uses PRN Furosemide 40 mg  1 tablet daily - Filled at [facility] 9/13 90 DS so she can take it PRN  Any concerns about your medications? No  How often do you forget or accidentally miss a dose? Rarely  Do you use a pillbox? No  Is patient in packaging Yes  If yes  What is the date on your next pill pack? 05/25/2021 evening meal   Any concerns or issues with your packaging? No   Refills requested from providers include: Sotalol 120mg  1 tablet twice daily (1 breakfast and 1 evening meal), Gabapentin 100mg  1 capsule three times a day (1 breakfast, 1 evening meal, 1 bedtime) and Tizanidine 4mg  2 tablets daily (2 bedtime).  Confirmed delivery date of 06/07/2021, advised patient that pharmacy will contact them the morning of delivery.  Recent blood pressure readings are as follows: Patient does not check at home.   Annual wellness visit in last year? Yes 04/13/2021  Most Recent BP reading: 128/74 on 05/21/2021  [name], CPP notified  [name], [place] Assistant [phone]   I have reviewed the care management and care coordination activities outlined in this encounter and I am certifying that I agree with the content of this note. No further action required.  [name], PharmD Clinical Pharmacist [facility] [phone]

## 2021-05-31 ENCOUNTER — Telehealth: Payer: Self-pay | Admitting: Adult Health

## 2021-05-31 DIAGNOSIS — I5032 Chronic diastolic (congestive) heart failure: Secondary | ICD-10-CM

## 2021-05-31 MED ORDER — FUROSEMIDE 40 MG PO TABS: 20.0000 mg | ORAL_TABLET | Freq: Every morning | ORAL | 3 refills | Status: DC

## 2021-05-31 NOTE — Telephone Encounter (Signed)
Pt c/o swelling: STAT is pt has developed SOB within 24 hours  If swelling, where is the swelling located? Both ankles; left ankle is worse  How much weight have you gained and in what time span? 10-15 lbs  Have you gained 3 pounds in a day or 5 pounds in a week? Yes   Do you have a log of your daily weights (if so, list)? Yes   Are you currently taking a fluid pill? Yes   Are you currently SOB? No   Have you traveled recently? No

## 2021-05-31 NOTE — Telephone Encounter (Signed)
Returned call to patient who reports she lost 10 lbs overnight with weight today at 166.4 lb and previous recorded weight 177 lb. She was last seen in our office on 10/7 and weight was 179 lb 12.8 oz. States she took an additional 40 mg of furosemide on 10/16 and had an extreme increase in urine output. She has taken furosemide 40 mg today (10/17) which is her regular daily dose. She asks if she should take any additional furosemide today and I advised her not to any additional furosemide at this time; advised her to take an additional 10 mEq of potassium chloride. Advised that I will forward message to Dr. Stanford Breed regarding whether he would like her to make any additional changes or have lab work done prior to follow-up appointment scheduled for 12/12. Patient verbalized understanding and agreement and thanked me for the call.

## 2021-05-31 NOTE — Telephone Encounter (Signed)
Spoke with pt, Aware of dr crenshaw's recommendations.  Lab orders mailed to the pt  

## 2021-06-04 ENCOUNTER — Telehealth: Payer: Self-pay | Admitting: Primary Care

## 2021-06-04 DIAGNOSIS — M62838 Other muscle spasm: Secondary | ICD-10-CM

## 2021-06-04 MED ORDER — TIZANIDINE HCL 4 MG PO TABS: ORAL_TABLET | ORAL | 0 refills | Status: DC

## 2021-06-04 NOTE — Telephone Encounter (Signed)
1.Medication Requested: tiZANidine (ZANAFLEX) 4 MG tablet  2. Pharmacy (Name, Street, [facility]): [facility] - [place], [place] - [address] 3. On Med List: yes  4. Last Visit with PCP: 5.27.22  5. Next visit date with PCP: N/a  Agent: Please be advised that RX refills may take up to 3 business days. We ask that you follow-up with your pharmacy.

## 2021-06-04 NOTE — Telephone Encounter (Signed)
Left message to return call to our office.  

## 2021-06-04 NOTE — Telephone Encounter (Signed)
Patient has not been seen for follow up since October 2021 and will need CPE for further refills. Please schedule.  Will send 30 day supply.

## 2021-06-05 ENCOUNTER — Emergency Department: Payer: Medicare HMO

## 2021-06-05 ENCOUNTER — Emergency Department
Admission: EM | Admit: 2021-06-05 | Discharge: 2021-06-06 | Disposition: A | Discharge status: Home / Self Care | Payer: Medicare HMO | Attending: Emergency Medicine | Admitting: Emergency Medicine

## 2021-06-05 ENCOUNTER — Encounter: Payer: Self-pay | Admitting: Emergency Medicine

## 2021-06-05 ENCOUNTER — Ambulatory Visit
Admission: EM | Admit: 2021-06-05 | Discharge: 2021-06-05 | Disposition: A | Discharge status: Other Acute Inpatient Hospital | Payer: Medicare HMO

## 2021-06-05 ENCOUNTER — Other Ambulatory Visit: Payer: Self-pay

## 2021-06-05 DIAGNOSIS — R059 Cough, unspecified: Secondary | ICD-10-CM | POA: Diagnosis not present

## 2021-06-05 DIAGNOSIS — I110 Hypertensive heart disease with heart failure: Secondary | ICD-10-CM | POA: Insufficient documentation

## 2021-06-05 DIAGNOSIS — Z96653 Presence of artificial knee joint, bilateral: Secondary | ICD-10-CM | POA: Insufficient documentation

## 2021-06-05 DIAGNOSIS — Z853 Personal history of malignant neoplasm of breast: Secondary | ICD-10-CM | POA: Diagnosis not present

## 2021-06-05 DIAGNOSIS — Z20822 Contact with and (suspected) exposure to covid-19: Secondary | ICD-10-CM | POA: Diagnosis not present

## 2021-06-05 DIAGNOSIS — R0602 Shortness of breath: Secondary | ICD-10-CM

## 2021-06-05 DIAGNOSIS — I5033 Acute on chronic diastolic (congestive) heart failure: Secondary | ICD-10-CM | POA: Insufficient documentation

## 2021-06-05 DIAGNOSIS — G20 Parkinson's disease: Secondary | ICD-10-CM | POA: Diagnosis not present

## 2021-06-05 DIAGNOSIS — I509 Heart failure, unspecified: Secondary | ICD-10-CM | POA: Diagnosis not present

## 2021-06-05 DIAGNOSIS — R062 Wheezing: Secondary | ICD-10-CM | POA: Diagnosis not present

## 2021-06-05 DIAGNOSIS — R0902 Hypoxemia: Secondary | ICD-10-CM

## 2021-06-05 DIAGNOSIS — Z79899 Other long term (current) drug therapy: Secondary | ICD-10-CM | POA: Insufficient documentation

## 2021-06-05 LAB — BASIC METABOLIC PANEL
Anion gap: 7 (ref 5–15)
BUN: 26 mg/dL — ABNORMAL HIGH (ref 8–23)
CO2: 32 mmol/L (ref 22–32)
Calcium: 9 mg/dL (ref 8.9–10.3)
Chloride: 98 mmol/L (ref 98–111)
Creatinine, Ser: 1.25 mg/dL — ABNORMAL HIGH (ref 0.44–1.00)
GFR, Estimated: 46 mL/min — ABNORMAL LOW (ref >60)
Glucose, Bld: 152 mg/dL — ABNORMAL HIGH (ref 70–99)
Potassium: 4.2 mmol/L (ref 3.5–5.1)
Sodium: 137 mmol/L (ref 135–145)

## 2021-06-05 LAB — CBC
HCT: 39.2 % (ref 36.0–46.0)
Hemoglobin: 13.1 g/dL (ref 12.0–15.0)
MCH: 32.1 pg (ref 26.0–34.0)
MCHC: 33.4 g/dL (ref 30.0–36.0)
MCV: 96.1 fL (ref 80.0–100.0)
Platelets: 187 10*3/uL (ref 150–400)
RBC: 4.08 MIL/uL (ref 3.87–5.11)
RDW: 13.2 % (ref 11.5–15.5)
WBC: 6.6 10*3/uL (ref 4.0–10.5)
nRBC: 0 % (ref 0.0–0.2)

## 2021-06-05 MED ORDER — FUROSEMIDE 10 MG/ML IJ SOLN
60.0000 mg | Freq: Once | INTRAMUSCULAR | Status: AC
  Administered 2021-06-06: 60 mg via INTRAVENOUS
  Filled 2021-06-05: qty 8

## 2021-06-05 NOTE — ED Provider Notes (Signed)
[facility] Emergency Department Provider Note ____________________________________________   Event Date/Time   First MD Initiated Contact with Patient 06/05/21 2257     (approximate)  I have reviewed the triage vital signs and the nursing notes.  HISTORY  Chief Complaint Shortness of Breath   HPI Selena Bush is a 75 y.o. femalewho presents to the ED for evaluation of shortness of breath.   Chart review indicates history of diastolic CHF, paroxysmal atrial fibrillation, HTN, HLD and GERD.  History of pericardial effusion and tamponade requiring pericardiocentesis last year.  This was a bloody effusion and anticoagulation was stopped after this. Parkinsonism. Lives at home and typically reliant upon a walker for ambulation.  Patient presents to the ED, accompanied by her roommate and friend, for evaluation of increasing cough, congestion and shortness of breath of the past couple days.  She reports about a week of upper respiratory congestion and clear rhinorrhea, but for the past 2 nights has had orthopnea and shortness of breath with increasing lower extremity edema.  She reports compliance with her Lasix with typical urinary output.  Denies fever, chest pain, falls or injuries, syncopal episodes, abdominal pain or emesis.  Past Medical History:  Diagnosis Date   Anxiety    Atrial fibrillation (HCC)    Chronic diastolic CHF (congestive heart failure) ([place])    a. echo 09/2014: EF 76-28%, diastolic dysfunction, mild LVH, nl RV size & systolic function, mildly dilated LA (4.3 cm), mild MR/TR, mildly elevated PASP 36.7 mm Hg   DDD (degenerative disc disease), cervical    DDD (degenerative disc disease), lumbar    Depression    Diffuse cystic mastopathy 2014   Dyspnea    Dysrhythmia    GERD (gastroesophageal reflux disease)    Gout    Headache    rare   HLD (hyperlipidemia)    a. statin intolerant 2/2 myalgias   Hx of dysplastic nevus 12/25/2018   L  medial ankle   Hypercholesterolemia    Hypertension    Malignant neoplasm of upper-outer quadrant of female breast ([place]) 10/2012   Papillary DCIS, sentinel node negative. DR/PR positive. PARTIAL RIGHT MASTECTOMY FOR BREAST CANCER--HAD RADIATION - NO CHEMO --DR. CRYSTAL [place]  ONCOLOGIST   Obesity    OSA on CPAP    Osteoarthritis of both knees    a. s/p right TKA 04/2013 & left TKA 09/2014   Otitis externa    Personal history of radiation therapy 2015   RIGHT breast-mammosite per pt   Sleep difficulties    LUNESTA HAS HELPED   Vaginal cyst     Patient Active Problem List   Diagnosis Date Noted   Pneumonia of both lungs due to infectious organism 01/08/2021   Parkinson's disease ([place]) 07/29/2019   Cat bite of right hand 03/25/2019   DDD (degenerative disc disease), lumbar 10/19/2018   Preventative health care 10/19/2018   Prediabetes 10/19/2018   (HFpEF) heart failure with preserved ejection fraction ([place]) 12/12/2017   Pericardial effusion with cardiac tamponade 11/13/2017   Paroxysmal atrial fibrillation ([place]) 11/05/2017   Anxiety 03/21/2016   Insomnia 07/17/2015   MI (mitral incompetence) 12/10/2014   Major depressive disorder 12/09/2014   OSA (obstructive sleep apnea) 12/09/2014   GERD (gastroesophageal reflux disease) 12/09/2014   Hyperglycemia 12/09/2014   Chronic diastolic CHF (congestive heart failure) ([place])    HLD (hyperlipidemia)    Osteoarthritis of both knees    S/P left TKA 08/19/2014   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 (  BMI) 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 ([place]) 04/17/2013   History of ductal carcinoma in situ (DCIS) of breast 11/05/2012   Essential hypertension, benign 11/05/2012    Past Surgical History:  Procedure Laterality Date   ABDOMINAL HYSTERECTOMY  1992   DUB; fibroids; endometriosis.  One remaining ovary.     BREAST LUMPECTOMY Right 2015   Papillary DCIS, sentinel node negative. DR/PR positive. PARTIAL RIGHT MASTECTOMY FOR BREAST  CANCER--HAD RADIATION - NO CHEMO --DR. CRYSTAL [place] ONCOLOGIST   BREAST SURGERY Right 10/2012   Wide excision,APB RT 10 mm papillary DCIS, ER/PR positive. Sentinel node negative. Partial breast radiation.   CATARACT EXTRACTION, BILATERAL  02/13/2016   [place].   CHOLECYSTECTOMY  1994   COLONOSCOPY  2015   1 benign polyp-every 5 years/ Dr Candace Cruise   COLONOSCOPY WITH PROPOFOL N/A 02/10/2021   Procedure: COLONOSCOPY WITH PROPOFOL;  Surgeon: Robert Bellow, MD;  Location[facility] END[facility]COPY;  Service: Endoscopy;  Laterality: N/A;   ERCP  1995   JOINT REPLACEMENT Right 04/2013   knee   PERICARDIOCENTESIS N/A 11/13/2017   Procedure: PERICARDIOCENTESIS;  Surgeon: Nelva Bush, MD;  Location: [facility];  Service: Cardiovascular;  Laterality: N/A;   TOTAL KNEE ARTHROPLASTY Right 04/16/2013   Procedure: RIGHT TOTAL KNEE ARTHROPLASTY;  Surgeon: Mauri Pole, MD;  Location: [facility] ORS;  Service: Orthopedics;  Laterality: Right;   TOTAL KNEE ARTHROPLASTY Left 09/15/2014   Procedure: LEFT TOTAL KNEE ARTHROPLASTY;  Surgeon: Mauri Pole, MD;  Location: [facility] ORS;  Service: Orthopedics;  Laterality: Left;   TUBAL LIGATION  1979   UPPER GI ENDOSCOPY      Prior to Admission medications   Medication Sig Start Date End Date Taking? Authorizing Provider  acetaminophen (TYLENOL) 500 MG tablet Take by mouth.    [provider]  albuterol (VENTOLIN HFA) 108 (90 Base) MCG/ACT inhaler Inhale 2 puffs into the lungs every 4 (four) hours as needed. 12/07/20   [provider]  bisacodyl (DULCOLAX) 5 MG EC tablet Take two tablets morning and two tablets afternoon day prior to Miralax prep. 12/15/20   [provider]  carbidopa-levodopa (SINEMET IR) 25-100 MG tablet Take by mouth. Take 2 tablets at breakfast, 1 tab at lunch, 1 in the afternoon and 1 at night. 02/12/20   [provider]  diltiazem (CARDIZEM CD) 180 MG 24 hr capsule TAKE ONE TABLET BY MOUTH EVERY MORNING 10/26/20    Lelon Perla, MD  DULoxetine (CYMBALTA) 60 MG capsule TAKE 1 CAPSULE BY MOUTH 2 TIMES A DAY for anxiety and depression. 01/05/21   Pleas Koch, NP  ezetimibe (ZETIA) 10 MG tablet Take 1 tablet (10 mg total) by mouth daily. 07/13/20 10/11/20  Lelon Perla, MD  fluticasone (FLONASE) 50 MCG/ACT nasal spray Place 2 sprays into both nostrils daily as needed for rhinitis or allergies. 10/06/20   Pleas Koch, NP  furosemide (LASIX) 40 MG tablet Take 0.5 tablets (20 mg total) by mouth every morning. 05/31/21   Lelon Perla, MD  gabapentin (NEURONTIN) 100 MG capsule TAKE 1 CAPSULE BY MOUTH THREE TIMES A DAY for anxiety. 11/13/19   Pleas Koch, NP  gabapentin (NEURONTIN) 300 MG capsule Take 600 mg by mouth at bedtime. 01/04/21   [provider]  hydrocortisone 2.5 % cream Apply to affected under breasts 1-2 times a day as directed and as needed for itch. Can also use on face as needed for itch 01/05/21   Brendolyn Patty, MD  ketoconazole (NIZORAL) 2 %  cream Apply to affected areas under breast twice daily for rash until healed. Can also use on face as needed for dermatitis 01/05/21   Brendolyn Patty, MD  metoCLOPramide (REGLAN) 5 MG tablet Take one tablet 30 minutes prior to colonoscopy prep. May repeat in 4 hours if needed. 12/15/20   [provider]  metoprolol tartrate (LOPRESSOR) 25 MG tablet TAKE THREE TABLETS BY MOUTH EVERY MORNING and TAKE THREE TABLETS BY MOUTH EVERY EVENING 01/25/21   Lelon Perla, MD  metroNIDAZOLE (METROCREAM) 0.75 % cream Apply topically 2 (two) times daily. Apply to red areas of face 01/05/21 01/05/22  Brendolyn Patty, MD  omeprazole (PRILOSEC) 40 MG capsule Take 40 mg by mouth daily.    [provider]  polyethylene glycol powder (GLYCOLAX/MIRALAX) 17 GM/SCOOP powder One bottle for colonoscopy prep. Use as directed. 12/15/20   [provider]  potassium chloride (MICRO-K) 10 MEQ CR capsule Take 1 capsule (10 mEq total) by  mouth daily. 04/08/21   Loel Dubonnet, NP  sotalol (BETAPACE) 120 MG tablet TAKE ONE TABLET BY MOUTH TWICE DAILY 05/26/21   Lelon Perla, MD  spironolactone (ALDACTONE) 25 MG tablet Take 1 tablet (25 mg total) by mouth daily. 05/21/21 08/19/21  Lendon Colonel, NP  tiZANidine (ZANAFLEX) 4 MG tablet TAKE 2 TABLET (4 MG TOTAL) BY MOUTH DAILY FOR MUSCLE SPASMS AT BEDTIME. Office visit required for further refills. 06/04/21   Pleas Koch, NP    Allergies Penicillins, Sulfa antibiotics, Codeine, Statins, and Aspirin  Family History  Problem Relation Age of Onset   Colon cancer Mother    Cancer Mother 62       colon cancer   Melanoma Father    Cancer Father 6       melanoma   Colon cancer Maternal Grandfather    Breast cancer Maternal Grandfather    Melanoma Sister    Melanoma Sister     Social History Social History   Tobacco Use   Smoking status: Never   Smokeless tobacco: Never   Tobacco comments:    social smoker as a teen  Scientific laboratory technician Use: Never used  Substance Use Topics   Alcohol use: No   Drug use: No    Review of Systems  Constitutional: No fever/chills Eyes: No visual changes. ENT: No sore throat. Positive for congestion and rhinorrhea Cardiovascular: Denies chest pain. Respiratory: Positive for shortness of breath and orthopnea. Gastrointestinal: No abdominal pain.  No nausea, no vomiting.  No diarrhea.  No constipation. Genitourinary: Negative for dysuria. Musculoskeletal: Negative for back pain. Skin: Negative for rash. Neurological: Negative for headaches, focal weakness or numbness.  ____________________________________________   PHYSICAL EXAM:  VITAL SIGNS: Vitals:   06/06/21 0100 06/06/21 0200  BP: (!) 170/90 (!) 152/89  Pulse: 71 69  Resp: 19 (!) 24  Temp:    SpO2: 96% 96%     Constitutional: Alert and oriented. Well appearing and in no acute distress. Eyes: Conjunctivae are normal. PERRL. EOMI. Head:  Atraumatic. Nose: Mild congestion/rhinnorhea. Mouth/Throat: Mucous membranes are moist.  Oropharynx non-erythematous. Neck: No stridor. No cervical spine tenderness to palpation. Cardiovascular: Normal rate, regular rhythm. Grossly normal heart sounds.  Good peripheral circulation. Respiratory: Normal respiratory effort.  No retractions.  Faint bibasilar crackles, otherwise clear.  No wheezing.  Occasional wet-sounding cough during my evaluation. Gastrointestinal: Soft , nondistended, nontender to palpation. No CVA tenderness. Musculoskeletal: No lower extremity tenderness .  No joint effusions. No signs of acute trauma.  Symmetric pitting edema to bilateral lower extremities up to the knees.  No overlying skin changes or signs of trauma. Neurologic:  Normal speech and language. No gross focal neurologic deficits are appreciated. No gait instability noted. Skin:  Skin is warm, dry and intact. No rash noted. Psychiatric: Mood and affect are normal. Speech and behavior are normal.  ____________________________________________   LABS (all labs ordered are listed, but only abnormal results are displayed)  Labs Reviewed  BASIC METABOLIC PANEL - Abnormal; Notable for the following components:      Result Value   Glucose, Bld 152 (*)    BUN 26 (*)    Creatinine, Ser 1.25 (*)    GFR, Estimated 46 (*)    All other components within normal limits  BRAIN NATRIURETIC PEPTIDE - Abnormal; Notable for the following components:   B Natriuretic Peptide 211.7 (*)    All other components within normal limits  RESP PANEL BY RT-PCR (FLU A&B, COVID) ARPGX2  CBC  TROPONIN I (HIGH SENSITIVITY)  TROPONIN I (HIGH SENSITIVITY)   ____________________________________________  12 Lead EKG  Sinus rhythm, rate of 64 bpm.  Normal axis and intervals.  Nonspecific ST changes laterally and inferiorly without STEMI.  Similar to EKG from 1 year ago. ____________________________________________  RADIOLOGY  ED MD  interpretation:  CXR reviewed by me without evidence of acute cardiopulmonary pathology. Elevated right hemidiaphragm is chronic.    Official radiology report(s): DG Chest 2 View  Result Date: 06/05/2021 CLINICAL DATA:  Shortness of breath with cough, congestion and wheezing for 4 days. EXAM: CHEST - 2 VIEW COMPARISON:  Radiographs 01/08/2021.  CT 09/20/2014. FINDINGS: The heart size and mediastinal contours are stable with aortic atherosclerosis. There is chronic elevation of the right hemidiaphragm associated with chronic bibasilar atelectasis or scarring. No superimposed edema, confluent airspace opacity, pleural effusion or pneumothorax. There are degenerative changes throughout the spine associated with an exaggerated thoracic kyphosis. No acute osseous findings are evident. Glenohumeral degenerative changes are present bilaterally, worse on the left. IMPRESSION: Stable chest.  No evidence of acute cardiopulmonary process. Electronically Signed   By: Richardean Sale M.D.   On: 06/05/2021 17:37    ____________________________________________   PROCEDURES and INTERVENTIONS  Procedure(s) performed (including Critical Care):  .1-3 Lead EKG Interpretation Performed by: Vladimir Crofts, MD Authorized by: Vladimir Crofts, MD     Interpretation: normal     ECG rate:  64   ECG rate assessment: normal     Rhythm: sinus rhythm     Ectopy: none     Conduction: normal    Medications  furosemide (LASIX) injection 60 mg (60 mg Intravenous Given 06/06/21 0010)  carbidopa-levodopa (SINEMET IR) 25-100 MG per tablet immediate release 1 tablet (1 tablet Oral Given 06/06/21 0230)    ____________________________________________   MDM / ED COURSE   Pleasant 75 year old woman presents to the ED with shortness of breath, with evidence of CHF exacerbation, amenable to trial of outpatient management.  No hypoxia or instability.  CXR without infiltrate to suggest CAP.  Marginal worsening of her renal  dysfunction, possibly a mild degree of cardiorenal syndrome.  BNP is elevated and her clinical syndrome is most consistent with volume overload and CHF exacerbation.  Provided IV dose of Lasix with good urinary output and improvement of her respiratory symptoms.  She is quite eager to go home and denies my offer for observation medical admission.  We will discharge with close return precautions and referral to our CHF clinic.  Clinical Course as  of 06/06/21 [id number]  Nancy Fetter Jun 06, 2021  0005 Patient reports that she is eager to go home.  We discussed the possibility of initiating diuresis here in the ED and following up closely with the heart failure clinic.  We discussed IV Lasix, checking an BN P and COVID test.  We discussed being here for couple hours before reassessment and possibility of discharge home.  She is agreeable. [DS]  0120 Reassessed.  Patient reports feeling better.  She is requesting a dose of her home Sinemet.  About 500 cc of urine out.  She is requesting discharge. [DS]  0202 Lab has been unable to run a troponin. I've called multiple times. Again asking for another blood tube [DS]    Clinical Course User Index [DS] Vladimir Crofts, MD    ____________________________________________   FINAL CLINICAL IMPRESSION(S) / ED DIAGNOSES  Final diagnoses:  Acute on chronic diastolic congestive heart failure ([place])  Shortness of breath     ED Discharge Orders          Ordered    AMB referral to CHF clinic        06/06/21 0031             Jeannia Tatro Tamala Julian   Note:  This document was prepared using Dragon voice recognition software and may include unintentional dictation errors.    Vladimir Crofts, MD 06/06/21 [phone]

## 2021-06-05 NOTE — Discharge Instructions (Addendum)
Go to the emergency department for evaluation of your shortness of breath and other symptoms.

## 2021-06-05 NOTE — ED Triage Notes (Signed)
Sent to ED from Urgent Care. Patient c/o wheezing, SOB, cough, and congestion x 4 days.  AAOx3.  Skin warm and dry.  No SOB noted.

## 2021-06-05 NOTE — ED Triage Notes (Signed)
Pt here with SOB with chest congestion, headache, nasal congestion, fatigue x 4 days.

## 2021-06-05 NOTE — ED Provider Notes (Signed)
Selena Bush    CSN: [id number] Arrival date & time: 06/05/21  1501      History   Chief Complaint Chief Complaint  Patient presents with   Shortness of Breath   Cough   Nasal Congestion    HPI Selena Bush is a 75 y.o. female.  Accompanied by a friend, patient presents with 4-day history of chest congestion, wheezing, cough, shortness of breath, fatigue, headache.  She denies fever, chills, or other symptoms.  No treatments at home.  Her medical history includes hypertension, congestive heart failure, MI, atrial fibrillation, pericardial effusion with cardiac tamponade, pneumonia, obesity.  The history is provided by the patient and medical records.   Past Medical History:  Diagnosis Date   Anxiety    Atrial fibrillation (HCC)    Chronic diastolic CHF (congestive heart failure) ([place])    a. echo 09/2014: EF 94-76%, diastolic dysfunction, mild LVH, nl RV size & systolic function, mildly dilated LA (4.3 cm), mild MR/TR, mildly elevated PASP 36.7 mm Hg   DDD (degenerative disc disease), cervical    DDD (degenerative disc disease), lumbar    Depression    Diffuse cystic mastopathy 2014   Dyspnea    Dysrhythmia    GERD (gastroesophageal reflux disease)    Gout    Headache    rare   HLD (hyperlipidemia)    a. statin intolerant 2/2 myalgias   Hx of dysplastic nevus 12/25/2018   L medial ankle   Hypercholesterolemia    Hypertension    Malignant neoplasm of upper-outer quadrant of female breast ([place]) 10/2012   Papillary DCIS, sentinel node negative. DR/PR positive. PARTIAL RIGHT MASTECTOMY FOR BREAST CANCER--HAD RADIATION - NO CHEMO --DR. CRYSTAL [place] ONCOLOGIST   Obesity    OSA on CPAP    Osteoarthritis of both knees    a. s/p right TKA 04/2013 & left TKA 09/2014   Otitis externa    Personal history of radiation therapy 2015   RIGHT breast-mammosite per pt   Sleep difficulties    LUNESTA HAS HELPED   Vaginal cyst     Patient Active Problem List    Diagnosis Date Noted   Pneumonia of both lungs due to infectious organism 01/08/2021   Parkinson's disease ([place]) 07/29/2019   Cat bite of right hand 03/25/2019   DDD (degenerative disc disease), lumbar 10/19/2018   Preventative health care 10/19/2018   Prediabetes 10/19/2018   (HFpEF) heart failure with preserved ejection fraction ([place]) 12/12/2017   Pericardial effusion with cardiac tamponade 11/13/2017   Paroxysmal atrial fibrillation ([place]) 11/05/2017   Anxiety 03/21/2016   Insomnia 07/17/2015   MI (mitral incompetence) 12/10/2014   Major depressive disorder 12/09/2014   OSA (obstructive sleep apnea) 12/09/2014   GERD (gastroesophageal reflux disease) 12/09/2014   Hyperglycemia 12/09/2014   Chronic diastolic CHF (congestive heart failure) ([place])    HLD (hyperlipidemia)    Osteoarthritis of both knees    S/P left TKA 08/19/2014   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 (BMI) 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 ([place]) 04/17/2013   History of ductal carcinoma in situ (DCIS) of breast 11/05/2012   Essential hypertension, benign 11/05/2012    Past Surgical History:  Procedure Laterality Date   ABDOMINAL HYSTERECTOMY  1992   DUB; fibroids; endometriosis.  One remaining ovary.     BREAST LUMPECTOMY Right 2015   Papillary DCIS, sentinel node negative. DR/PR positive. PARTIAL RIGHT MASTECTOMY FOR BREAST CANCER--HAD RADIATION - NO CHEMO --DR. [place] ONCOLOGIST   BREAST SURGERY  Right 10/2012   Wide excision,APB RT 10 mm papillary DCIS, ER/PR positive. Sentinel node negative. Partial breast radiation.   CATARACT EXTRACTION, BILATERAL  02/13/2016   [facility].   CHOLECYSTECTOMY  1994   COLONOSCOPY  2015   1 benign polyp-every 5 years/ Dr Candace Cruise   COLONOSCOPY WITH PROPOFOL N/A 02/10/2021   Procedure: COLONOSCOPY WITH PROPOFOL;  Surgeon: Robert Bellow, MD;  Location: [facility] ENDOSCOPY;  Service: Endoscopy;  Laterality: N/A;   ERCP  1995   JOINT REPLACEMENT Right 04/2013   knee    PERICARDIOCENTESIS N/A 11/13/2017   Procedure: PERICARDIOCENTESIS;  Surgeon: Nelva Bush, MD;  Location: [facility];  Service: Cardiovascular;  Laterality: N/A;   TOTAL KNEE ARTHROPLASTY Right 04/16/2013   Procedure: RIGHT TOTAL KNEE ARTHROPLASTY;  Surgeon: Mauri Pole, MD;  Location: [facility] ORS;  Service: Orthopedics;  Laterality: Right;   TOTAL KNEE ARTHROPLASTY Left 09/15/2014   Procedure: LEFT TOTAL KNEE ARTHROPLASTY;  Surgeon: Mauri Pole, MD;  Location: [facility] ORS;  Service: Orthopedics;  Laterality: Left;   TUBAL LIGATION  1979   UPPER GI ENDOSCOPY      OB History     Gravida  2   Para  2   Term      Preterm      AB      Living  2      SAB      IAB      Ectopic      Multiple      Live Births           Obstetric Comments  First pregnancy 22 First menstrual 15          Home Medications    Prior to Admission medications   Medication Sig Start Date End Date Taking? Authorizing Provider  acetaminophen (TYLENOL) 500 MG tablet Take by mouth.    [provider]  albuterol (VENTOLIN HFA) 108 (90 Base) MCG/ACT inhaler Inhale 2 puffs into the lungs every 4 (four) hours as needed. 12/07/20   [provider]  bisacodyl (DULCOLAX) 5 MG EC tablet Take two tablets morning and two tablets afternoon day prior to Miralax prep. 12/15/20   [provider]  carbidopa-levodopa (SINEMET IR) 25-100 MG tablet Take by mouth. Take 2 tablets at breakfast, 1 tab at lunch, 1 in the afternoon and 1 at night. 02/12/20   [provider]  diltiazem (CARDIZEM CD) 180 MG 24 hr capsule TAKE ONE TABLET BY MOUTH EVERY MORNING 10/26/20   Lelon Perla, MD  DULoxetine (CYMBALTA) 60 MG capsule TAKE 1 CAPSULE BY MOUTH 2 TIMES A DAY for anxiety and depression. 01/05/21   Pleas Koch, NP  ezetimibe (ZETIA) 10 MG tablet Take 1 tablet (10 mg total) by mouth daily. 07/13/20 10/11/20  Lelon Perla, MD  fluticasone (FLONASE) 50 MCG/ACT nasal spray  Place 2 sprays into both nostrils daily as needed for rhinitis or allergies. 10/06/20   Pleas Koch, NP  furosemide (LASIX) 40 MG tablet Take 0.5 tablets (20 mg total) by mouth every morning. 05/31/21   Lelon Perla, MD  gabapentin (NEURONTIN) 100 MG capsule TAKE 1 CAPSULE BY MOUTH THREE TIMES A DAY for anxiety. 11/13/19   Pleas Koch, NP  gabapentin (NEURONTIN) 300 MG capsule Take 600 mg by mouth at bedtime. 01/04/21   [provider]  hydrocortisone 2.5 % cream Apply to affected under breasts 1-2 times a day as directed and as needed for itch. Can also use  on face as needed for itch 01/05/21   Brendolyn Patty, MD  ketoconazole (NIZORAL) 2 % cream Apply to affected areas under breast twice daily for rash until healed. Can also use on face as needed for dermatitis 01/05/21   Brendolyn Patty, MD  metoCLOPramide (REGLAN) 5 MG tablet Take one tablet 30 minutes prior to colonoscopy prep. May repeat in 4 hours if needed. 12/15/20   [provider]  metoprolol tartrate (LOPRESSOR) 25 MG tablet TAKE THREE TABLETS BY MOUTH EVERY MORNING and TAKE THREE TABLETS BY MOUTH EVERY EVENING 01/25/21   Lelon Perla, MD  metroNIDAZOLE (METROCREAM) 0.75 % cream Apply topically 2 (two) times daily. Apply to red areas of face 01/05/21 01/05/22  Brendolyn Patty, MD  omeprazole (PRILOSEC) 40 MG capsule Take 40 mg by mouth daily.    [provider]  polyethylene glycol powder (GLYCOLAX/MIRALAX) 17 GM/SCOOP powder One bottle for colonoscopy prep. Use as directed. 12/15/20   [provider]  potassium chloride (MICRO-K) 10 MEQ CR capsule Take 1 capsule (10 mEq total) by mouth daily. 04/08/21   Loel Dubonnet, NP  sotalol (BETAPACE) 120 MG tablet TAKE ONE TABLET BY MOUTH TWICE DAILY 05/26/21   Lelon Perla, MD  spironolactone (ALDACTONE) 25 MG tablet Take 1 tablet (25 mg total) by mouth daily. 05/21/21 08/19/21  Lendon Colonel, NP  tiZANidine (ZANAFLEX) 4 MG tablet TAKE 2 TABLET  (4 MG TOTAL) BY MOUTH DAILY FOR MUSCLE SPASMS AT BEDTIME. Office visit required for further refills. 06/04/21   Pleas Koch, NP    Family History Family History  Problem Relation Age of Onset   Colon cancer Mother    Cancer Mother 74       colon cancer   Melanoma Father    Cancer Father 61       melanoma   Colon cancer Maternal Grandfather    Breast cancer Maternal Grandfather    Melanoma Sister    Melanoma Sister     Social History Social History   Tobacco Use   Smoking status: Never   Smokeless tobacco: Never   Tobacco comments:    social smoker as a teen  Scientific laboratory technician Use: Never used  Substance Use Topics   Alcohol use: No   Drug use: No     Allergies   Penicillins, Sulfa antibiotics, Codeine, Statins, and Aspirin   Review of Systems Review of Systems  Constitutional:  Positive for fatigue. Negative for chills and fever.  HENT:  Positive for congestion. Negative for ear pain and sore throat.   Respiratory:  Positive for cough and shortness of breath.   Cardiovascular:  Negative for chest pain and palpitations.  Skin:  Negative for color change and rash.  Neurological:  Positive for headaches. Negative for dizziness.  All other systems reviewed and are negative.   Physical Exam Triage Vital Signs ED Triage Vitals  Enc Vitals Group     BP 06/05/21 1518 (!) 143/82     Pulse Rate 06/05/21 1518 63     Resp 06/05/21 1518 18     Temp 06/05/21 1518 97.9 F (36.6 C)     Temp src --      SpO2 06/05/21 1523 93 %     Weight --      Height --      Head Circumference --      Peak Flow --      Pain Score --      Pain Loc --  Pain Edu? --      Excl. in [place]? --    No data found.  Updated Vital Signs BP (!) 143/82 (BP Location: Left Arm)   Pulse 63   Temp 97.9 F (36.6 C)   Resp 18   SpO2 93%   Visual Acuity Right Eye Distance:   Left Eye Distance:   Bilateral Distance:    Right Eye Near:   Left Eye Near:    Bilateral Near:      Physical Exam Vitals and nursing note reviewed.  Constitutional:      General: She is not in acute distress.    Appearance: She is well-developed. She is obese. She is ill-appearing.  HENT:     Head: Normocephalic and atraumatic.     Mouth/Throat:     Mouth: Mucous membranes are moist.  Eyes:     Conjunctiva/sclera: Conjunctivae normal.  Cardiovascular:     Rate and Rhythm: Normal rate and regular rhythm.     Heart sounds: Normal heart sounds.  Pulmonary:     Effort: Pulmonary effort is normal. No respiratory distress.     Breath sounds: Normal breath sounds. Decreased air movement present.  Abdominal:     Palpations: Abdomen is soft.     Tenderness: There is no abdominal tenderness.  Musculoskeletal:     Cervical back: Neck supple.  Skin:    General: Skin is warm and dry.  Neurological:     Mental Status: She is alert and oriented to person, place, and time.  Psychiatric:        Mood and Affect: Mood normal.        Behavior: Behavior normal.     UC Treatments / Results  Labs (all labs ordered are listed, but only abnormal results are displayed) Labs Reviewed - No data to display  EKG   Radiology No results found.  Procedures Procedures (including critical care time)  Medications Ordered in UC Medications - No data to display  Initial Impression / Assessment and Plan / UC Course  I have reviewed the triage vital signs and the nursing notes.  Pertinent labs & imaging results that were available during my care of the patient were reviewed by me and considered in my medical decision making (see chart for details).   Shortness of breath, hypoxia.  Patient is ill-appearing.  O2 sat 93% on room air.  She reports shortness of breath.  Lung sounds are diminished throughout.  I discussed with patient and her friend that I feel she needs a higher level of care based on her exam today, her symptoms, her medical history.  Instructed her to go to the ED for evaluation.   She declines EMS and states she feels stable to go POV with her friend.   Final Clinical Impressions(s) / UC Diagnoses   Final diagnoses:  Shortness of breath  Hypoxia     Discharge Instructions      Go to the emergency department for evaluation of your shortness of breath and other symptoms.     ED Prescriptions   None    PDMP not reviewed this encounter.   Sharion Balloon, NP 06/05/21 [phone]

## 2021-06-06 DIAGNOSIS — I509 Heart failure, unspecified: Secondary | ICD-10-CM | POA: Diagnosis not present

## 2021-06-06 DIAGNOSIS — I110 Hypertensive heart disease with heart failure: Secondary | ICD-10-CM | POA: Diagnosis not present

## 2021-06-06 LAB — BRAIN NATRIURETIC PEPTIDE: B Natriuretic Peptide: 211.7 pg/mL — ABNORMAL HIGH (ref 0.0–100.0)

## 2021-06-06 LAB — TROPONIN I (HIGH SENSITIVITY): Troponin I (High Sensitivity): 5 ng/L (ref <18)

## 2021-06-06 LAB — RESP PANEL BY RT-PCR (FLU A&B, COVID) ARPGX2
Influenza A by PCR: NEGATIVE
Influenza B by PCR: NEGATIVE
SARS Coronavirus 2 by RT PCR: NEGATIVE

## 2021-06-06 MED ORDER — CARBIDOPA-LEVODOPA 25-100 MG PO TABS
1.0000 | ORAL_TABLET | Freq: Once | ORAL | Status: AC
  Administered 2021-06-06: 1 via ORAL
  Filled 2021-06-06: qty 1

## 2021-06-06 NOTE — Discharge Instructions (Addendum)
The heart failure clinic should call you on Monday to be seen in the clinic on Monday or Tuesday.  For the next few days, until you see them, I would double your dose of furosemide.  You can take 2 tablets at once, or you can do your normal dose twice daily.  If you develop any worsening symptoms despite this, please return to the ED.

## 2021-06-07 ENCOUNTER — Telehealth: Payer: Self-pay | Admitting: *Deleted

## 2021-06-07 ENCOUNTER — Telehealth: Payer: Self-pay | Admitting: Cardiology

## 2021-06-07 ENCOUNTER — Telehealth: Payer: Self-pay | Admitting: Physician Assistant

## 2021-06-07 DIAGNOSIS — I5032 Chronic diastolic (congestive) heart failure: Secondary | ICD-10-CM

## 2021-06-07 MED ORDER — FUROSEMIDE 20 MG PO TABS: 20.0000 mg | ORAL_TABLET | Freq: Every morning | ORAL | 3 refills | Status: DC

## 2021-06-07 NOTE — Telephone Encounter (Signed)
Patient has a history of diastolic heart failure.  Unfortunately she has not taken her diuretic recently as dosing pack sent to her from pharmacy did not include diuretic.  She ended up going to the ED Saturday with a volume overload issue.  She has since restarted on the diuretic 20 mg daily of Lasix.  She developed a cough today.  She is not sure if it is a dry cough or productive cough.  I took a look at the chest x-ray obtained on Saturday, there was no acute process.  Her weight is actually down from Saturday.  She is due for IV diuretic tomorrow at [facility].  I instructed the patient to go to the ED only if she has paroxysmal nocturnal dyspnea which suggest worsening volume overload.  Otherwise, I think will be reasonable to wait until tomorrow to get IV diuretic.

## 2021-06-07 NOTE — Telephone Encounter (Signed)
Patient went to the ED, see notes.

## 2021-06-07 NOTE — Telephone Encounter (Signed)
Noted, agree with evaluation 

## 2021-06-07 NOTE — Telephone Encounter (Signed)
PLEASE NOTE: All timestamps contained within this report are represented as [place] Standard Time. CONFIDENTIALTY NOTICE: This fax transmission is intended only for the addressee. It contains information that is legally privileged, confidential or otherwise protected from use or disclosure. If you are not the intended recipient, you are strictly prohibited from reviewing, disclosing, copying using or disseminating any of this information or taking any action in reliance on or regarding this information. If you have received this fax in error, please notify us immediately by telephone so that we can arrange for its return to [place]. Phone: [phone], Toll-Free: [phone], Fax: [phone] Page: 1 of 2 Call Id: [id number] [place] RECORD AccessNurse Patient Name: Selena Bush [facility] Gender: Female DOB: 25-Sep-1945 Age: 75 Y 21 D Return Phone Number: [phone] (Primary), [phone] (Secondary) Address: [place] [address] Client [facility] Night - Client Client Site [place] Physician [name] - NP Contact Type Call Who Is Calling Patient / Member / Family / Caregiver Call Type Triage / Clinical Relationship To Patient Self Return Phone Number [phone] (Primary) Chief Complaint BREATHING - shortness of breath or sounds breathless Reason for Call Symptomatic / Request for [place] states has shortness of breath. Translation No Nurse Assessment Nurse: Clovis Riley, RN, Georgina Peer Date/Time (Eastern Time): 06/05/2021 1:03:54 PM Confirm and document reason for call. If symptomatic, describe symptoms. ---Caller states has shortness of breath that started last night when tried to drink out of a cup. States she has a runny nose and cough. Does the patient have any new or worsening symptoms? ---Yes Will a triage be  completed? ---Yes Related visit to physician within the last 2 weeks? ---No Does the PT have any chronic conditions? (i.e. diabetes, asthma, this includes High risk factors for pregnancy, etc.) ---Yes List chronic conditions. ---CHF Is this a behavioral health or substance abuse call? ---No Guidelines Guideline Title Affirmed Question Affirmed Notes Nurse Date/Time (Eastern Time) Breathing Difficulty [1] MILD difficulty breathing (e.g., minimal/no SOB at rest, SOB with walking, pulse <100) AND [2] NEW-onset or WORSE than normal [place], RN, Georgina Peer 06/05/2021 1:06:35 PM PLEASE NOTE: All timestamps contained within this report are represented as [place] Standard Time. CONFIDENTIALTY NOTICE: This fax transmission is intended only for the addressee. It contains information that is legally privileged, confidential or otherwise protected from use or disclosure. If you are not the intended recipient, you are strictly prohibited from reviewing, disclosing, copying using or disseminating any of this information or taking any action in reliance on or regarding this information. If you have received this fax in error, please notify us immediately by telephone so that we can arrange for its return to [place]. Phone: [phone], Toll-Free: [phone], Fax: [phone] Page: 2 of 2 Call Id: [id number] [place]. Time Eilene Ghazi Time) Disposition Final User 06/05/2021 1:03:12 PM Send to Urgent Vanita Ingles 06/05/2021 1:09:51 PM See HCP within 4 Hours (or PCP triage) Yes Clovis Riley, RN, Leilani Merl Disagree/Comply Comply Caller Understands Yes PreDisposition Did not know what to do Care Advice Given Per Guideline SEE HCP (OR PCP TRIAGE) WITHIN 4 HOURS: * IF OFFICE WILL BE CLOSED AND NO PCP (PRIMARY CARE PROVIDER) SECOND-LEVEL TRIAGE: You need to be seen within the next 3 or 4 hours. A nearby Urgent Care Center [facility]) is often a good source of care. Another choice is to go to the ED. Go sooner if  you become worse. CARE  ADVICE given per Breathing Difficulty (Adult) guideline. CALL BACK IF: * You become worse Referrals GO TO FACILITY UNDECIDED

## 2021-06-07 NOTE — Telephone Encounter (Signed)
Spoke with pt daughter, there is some confusion over the furosemide dose, the patient took the pills out of the bottle and put them in a bag, so they do not know the strength of the tablet. Aware she should take 20 mg of furosemide once daily. New script sent to the pharmacy.  They were also told that she needed to make an appointment to get another dose of IV lasix for today or tomorrow. The patient confirms that her weight is 175.2 lb which is down from what she weighted prior to going to the hospital. She will take the 20 mg of furosemide today and then tomorrow if her weight is still stable, she will not go for the IV lasix. She will call the office if her weight goes up overnight.

## 2021-06-07 NOTE — Progress Notes (Signed)
Patient ID: Selena Bush, female    DOB: 10/07/1945, 75 y.o.   MRN: [id number]  HPI  Selena Bush is a 75 y/o female with a history of breast cancer, hyperlipidemia, HNT, anxiety, atrial fibrillation, parkinson's disorder, DDD, depression, GERD, gout, obstructive sleep apnea and chronic heart failure.   Echo report from 04/29/21 reviewed and showed an EF of 65-70% along with mild LVH, moderately elevated PA pressure, severe LAE and possible PFO.  Was in the ED 06/05/21 due to cough, congestion and shortness of breath. Given IV lasix with improvement in symptoms and she was released.    She presents today for her initial visit with a chief complaint of moderate shortness of breath with minimal exertion. She describes this as having been present for several months. She has associated fatigue, productive cough, pedal edema, light-headedness, easy bruising and difficulty sleeping along with this. She denies any abdominal distention, palpitations, chest pain or weight gain.   Didn't realize until a couple of days ago that her pre-packaged meds did not have her furosemide included so she had been without it for ~ a month and only recently resumed it 2 days ago after picking up a 90 day supply from [facility]. Does feel a little better since resuming it but still feels like she's got fluid on board. Daughter is going to go to patient's home and double check the medications against the med list we have and let us know of any discrepancies.   Past Medical History:  Diagnosis Date   Anxiety    Atrial fibrillation (HCC)    Chronic diastolic CHF (congestive heart failure) ([place])    a. echo 09/2014: EF 63-78%, diastolic dysfunction, mild LVH, nl RV size & systolic function, mildly dilated LA (4.3 cm), mild MR/TR, mildly elevated PASP 36.7 mm Hg   DDD (degenerative disc disease), cervical    DDD (degenerative disc disease), lumbar    Depression    Diffuse cystic mastopathy 2014   Dyspnea    Dysrhythmia    GERD  (gastroesophageal reflux disease)    Gout    Headache    rare   HLD (hyperlipidemia)    a. statin intolerant 2/2 myalgias   Hx of dysplastic nevus 12/25/2018   L medial ankle   Hypercholesterolemia    Hypertension    Malignant neoplasm of upper-outer quadrant of female breast ([place]) 10/2012   Papillary DCIS, sentinel node negative. DR/PR positive. PARTIAL RIGHT MASTECTOMY FOR BREAST CANCER--HAD RADIATION - NO CHEMO --DR. CRYSTAL [place] ONCOLOGIST   Obesity    OSA on CPAP    Osteoarthritis of both knees    a. s/p right TKA 04/2013 & left TKA 09/2014   Otitis externa    Parkinson disease ([place])    Personal history of radiation therapy 2015   RIGHT breast-mammosite per pt   Sleep difficulties    LUNESTA HAS HELPED   Vaginal cyst     Past Surgical History:  Procedure Laterality Date   ABDOMINAL HYSTERECTOMY  1992   DUB; fibroids; endometriosis.  One remaining ovary.     BREAST LUMPECTOMY Right 2015   Papillary DCIS, sentinel node negative. DR/PR positive. PARTIAL RIGHT MASTECTOMY FOR BREAST CANCER--HAD RADIATION - NO CHEMO --DR. CRYSTAL [place] ONCOLOGIST   BREAST SURGERY Right 10/2012   Wide excision,APB RT 10 mm papillary DCIS, ER/PR positive. Sentinel node negative. Partial breast radiation.   CATARACT EXTRACTION, BILATERAL  02/13/2016   [facility].   CHOLECYSTECTOMY  1994   COLONOSCOPY  2015  1 benign polyp-every 5 years/ Dr Candace Cruise   COLONOSCOPY WITH PROPOFOL N/A 02/10/2021   Procedure: COLONOSCOPY WITH PROPOFOL;  Surgeon: Robert Bellow, MD;  Location: [facility] ENDOSCOPY;  Service: Endoscopy;  Laterality: N/A;   ERCP  1995   JOINT REPLACEMENT Right 04/2013   knee   PERICARDIOCENTESIS N/A 11/13/2017   Procedure: PERICARDIOCENTESIS;  Surgeon: Nelva Bush, MD;  Location: [place] [facility];  Service: Cardiovascular;  Laterality: N/A;   TOTAL KNEE ARTHROPLASTY Right 04/16/2013   Procedure: RIGHT TOTAL KNEE ARTHROPLASTY;  Surgeon: Mauri Pole, MD;  Location: [facility] ORS;   Service: Orthopedics;  Laterality: Right;   TOTAL KNEE ARTHROPLASTY Left 09/15/2014   Procedure: LEFT TOTAL KNEE ARTHROPLASTY;  Surgeon: Mauri Pole, MD;  Location: [facility] OR[facility];  Service: Orthopedics;  Laterality: Left;   TUBAL LIGATION  1979   UPPER GI ENDOSCOPY     Family History  Problem Relation Age of Onset   Colon cancer Mother    Cancer Mother 36       colon cancer   Melanoma Father    Cancer Father 36       melanoma   Colon cancer Maternal Grandfather    Breast cancer Maternal Grandfather    Melanoma Sister    Melanoma Sister    Social History   Tobacco Use   Smoking status: Never   Smokeless tobacco: Never   Tobacco comments:    social smoker as a teen  Substance Use Topics   Alcohol use: No   Allergies  Allergen Reactions   Penicillins Anaphylaxis    anaphylaxis  Has patient had a PCN reaction causing immediate rash, facial/tongue/throat swelling, SOB or lightheadedness with hypotension: Yes Has patient had a PCN reaction causing severe rash involving mucus membranes or skin necrosis: No Has patient had a PCN reaction that required hospitalization: No Has patient had a PCN reaction occurring within the last 10 years: No If all of the above answers are "NO", then may proceed with Cephalosporin use.    Sulfa Antibiotics Anaphylaxis   Codeine Other (See Comments) and Nausea And Vomiting    Gi problems    Statins Other (See Comments)    Leg pains   Aspirin Other (See Comments)    "burned my stomach intensely" Abdominal pain and burning   Prior to Admission medications   Medication Sig Start Date End Date Taking? Authorizing Provider  acetaminophen (TYLENOL) 500 MG tablet Take by mouth.   Yes [provider]  albuterol (VENTOLIN HFA) 108 (90 Base) MCG/ACT inhaler Inhale 2 puffs into the lungs every 4 (four) hours as needed. 12/07/20  Yes [provider]  carbidopa-levodopa (SINEMET IR) 25-100 MG tablet Take by mouth. Take 2 tablets at 6 AM, 1  at 9AM, 1 tab at 12 PM,  2 at 3 PM, 1 at 6 PM and 1 at 9 PM. 02/12/20  Yes [provider]  diltiazem (CARDIZEM CD) 180 MG 24 hr capsule TAKE ONE TABLET BY MOUTH EVERY MORNING 10/26/20  Yes Lelon Perla, MD  DULoxetine (CYMBALTA) 60 MG capsule TAKE 1 CAPSULE BY MOUTH 2 TIMES A DAY for anxiety and depression. 01/05/21  Yes Pleas Koch, NP  ezetimibe (ZETIA) 10 MG tablet Take 1 tablet (10 mg total) by mouth daily. 07/13/20  Yes Lelon Perla, MD  fluticasone (FLONASE) 50 MCG/ACT nasal spray Place 2 sprays into both nostrils daily as needed for rhinitis or allergies. 10/06/20  Yes Pleas Koch, NP  furosemide (LASIX) 20  MG tablet Take 1 tablet (20 mg total) by mouth every morning. 06/07/21  Yes Lelon Perla, MD  gabapentin (NEURONTIN) 100 MG capsule TAKE 1 CAPSULE BY MOUTH THREE TIMES A DAY for anxiety. 11/13/19  Yes Pleas Koch, NP  gabapentin (NEURONTIN) 300 MG capsule Take 600 mg by mouth at bedtime. 01/04/21  Yes [provider]  hydrocortisone 2.5 % cream Apply to affected under breasts 1-2 times a day as directed and as needed for itch. Can also use on face as needed for itch 01/05/21  Yes Brendolyn Patty, MD  ketoconazole (NIZORAL) 2 % cream Apply to affected areas under breast twice daily for rash until healed. Can also use on face as needed for dermatitis 01/05/21  Yes Brendolyn Patty, MD  metoprolol tartrate (LOPRESSOR) 25 MG tablet TAKE THREE TABLETS BY MOUTH EVERY MORNING and TAKE THREE TABLETS BY MOUTH EVERY EVENING 01/25/21  Yes Lelon Perla, MD  metroNIDAZOLE (METROCREAM) 0.75 % cream Apply topically 2 (two) times daily. Apply to red areas of face 01/05/21 01/05/22 Yes Brendolyn Patty, MD  potassium chloride (MICRO-K) 10 MEQ CR capsule Take 1 capsule (10 mEq total) by mouth daily. 04/08/21  Yes Loel Dubonnet, NP  sotalol (BETAPACE) 120 MG tablet TAKE ONE TABLET BY MOUTH TWICE DAILY 05/26/21  Yes Lelon Perla, MD  spironolactone (ALDACTONE) 25  MG tablet Take 1 tablet (25 mg total) by mouth daily. 05/21/21 08/19/21 Yes Lendon Colonel, NP  tiZANidine (ZANAFLEX) 4 MG tablet TAKE 2 TABLET (4 MG TOTAL) BY MOUTH DAILY FOR MUSCLE SPASMS AT BEDTIME. Office visit required for further refills. 06/04/21  Yes Pleas Koch, NP   Review of Systems  Constitutional:  Positive for fatigue (easily). Negative for appetite change.  HENT:  Negative for congestion, postnasal drip and sore throat.   Eyes: Negative.   Respiratory:  Positive for cough (productive) and shortness of breath (easily). Negative for chest tightness.   Cardiovascular:  Positive for leg swelling. Negative for chest pain and palpitations.  Gastrointestinal:  Negative for abdominal distention and abdominal pain.  Endocrine: Negative.   Genitourinary: Negative.   Musculoskeletal:  Negative for back pain and neck pain.  Skin: Negative.   Allergic/Immunologic: Negative.   Neurological:  Positive for light-headedness (at times). Negative for dizziness.  Hematological:  Negative for adenopathy. Bruises/bleeds easily.  Psychiatric/Behavioral:  Positive for sleep disturbance (starts coughing when laying down; last night slept in recliner). Negative for dysphoric mood.    Vitals:   06/08/21 0826  BP: (!) 97/54  Pulse: 83  Resp: 16  SpO2: 100%  Weight: 175 lb 8 oz (79.6 kg)  Height: 5\' 2"  (1.575 m)   Wt Readings from Last 3 Encounters:  06/08/21 175 lb 8 oz (79.6 kg)  06/05/21 179 lb 10.8 oz (81.5 kg)  05/21/21 179 lb 12.8 oz (81.6 kg)   Lab Results  Component Value Date   CREATININE 1.25 (H) 06/05/2021   CREATININE 1.18 (H) 05/21/2021   CREATININE 1.14 (H) 04/22/2021   Physical Exam Vitals and nursing note reviewed. Exam conducted with a chaperone present (daughter).  Constitutional:      Appearance: Normal appearance.  HENT:     Head: Normocephalic and atraumatic.  Cardiovascular:     Rate and Rhythm: Normal rate and regular rhythm.  Pulmonary:     Effort:  Pulmonary effort is normal. No respiratory distress.     Breath sounds: No wheezing or rales.  Abdominal:     General: There is no  distension.     Palpations: Abdomen is soft.  Musculoskeletal:        General: No tenderness.     Cervical back: Normal range of motion and neck supple.     Right lower leg: Edema (1+ pitting) present.     Left lower leg: Edema (1+ pitting) present.  Skin:    General: Skin is warm and dry.     Findings: Bruising (tops of both hands) present.  Neurological:     Mental Status: She is alert and oriented to person, place, and time. Mental status is at baseline.  Psychiatric:        Mood and Affect: Mood normal.        Behavior: Behavior normal.        Thought Content: Thought content normal.     Assessment & Plan:  1: Acute on Chronic heart failure with preserved ejection fraction with structural changes (LVH/LAE)- - NYHA class III - fluid overload with pedal edema & previous weight gain after being out of diuretic for ~ 1 month - weighing daily; reminded to call for an overnight weight gain of > 2 pounds or a weekly weight gain of > 5 pounds - will send for 80mg  IV lasix/ 43meq PO potassium today - BNP/BMP will be drawn today - saw cardiology Gilford Rile) 04/08/21 in [place] - not adding salt to her food; has a live in caregiver and they try and look at food labels; discussed rinsing the canned vegetables prior to cooking - current BP does not allow for initiation of entresto; consider adding jardiance in the future and entresto if able - BNP 06/06/21 was 211.7  2: HTN- - BP on the low side (97/54) - saw PCP Carlis Abbott) 01/08/21 - BMP 06/05/21 reviewed and showed sodium 137, potassium 4.2, creatinine 1.25 & GFR 46  3: Atrial fibrillation- - saw cardiology Purcell Nails) 05/21/21 in [place]  4: Sleep apnea- - normally wears CPAP but due to recent coughing, she hasn't been able to wear it as much   Patient did not bring her medications nor a list. Each medication  was verbally reviewed with the patient and she was encouraged to bring the bottles to every visit to confirm accuracy of list. Daughter is going to go home and review our medication list against her current pill pack  Return in 2 days or sooner for any questions/problems before then.

## 2021-06-07 NOTE — Telephone Encounter (Signed)
Patient's daughter states they have multiple questions about the patients medications since she was in the hospital this weekend. She states they also want her to do a lasix IV today or tomorrow and they want to make sure the office agrees.

## 2021-06-08 ENCOUNTER — Encounter: Payer: Self-pay | Admitting: Family

## 2021-06-08 ENCOUNTER — Ambulatory Visit (HOSPITAL_BASED_OUTPATIENT_CLINIC_OR_DEPARTMENT_OTHER): Payer: Medicare HMO | Admitting: Family

## 2021-06-08 ENCOUNTER — Other Ambulatory Visit: Payer: Self-pay

## 2021-06-08 ENCOUNTER — Ambulatory Visit
Admission: RE | Admit: 2021-06-08 | Discharge: 2021-06-08 | Disposition: A | Discharge status: Home / Self Care | Payer: Medicare HMO | Source: Ambulatory Visit | Attending: Family | Admitting: Family

## 2021-06-08 ENCOUNTER — Other Ambulatory Visit: Payer: Self-pay | Admitting: Family

## 2021-06-08 VITALS — BP 97/54 | HR 83 | Resp 16 | Ht 62.0 in | Wt 175.5 lb

## 2021-06-08 DIAGNOSIS — I4891 Unspecified atrial fibrillation: Secondary | ICD-10-CM | POA: Insufficient documentation

## 2021-06-08 DIAGNOSIS — I10 Essential (primary) hypertension: Secondary | ICD-10-CM | POA: Diagnosis not present

## 2021-06-08 DIAGNOSIS — R058 Other specified cough: Secondary | ICD-10-CM | POA: Diagnosis not present

## 2021-06-08 DIAGNOSIS — Z91138 Patient's unintentional underdosing of medication regimen for other reason: Secondary | ICD-10-CM | POA: Diagnosis not present

## 2021-06-08 DIAGNOSIS — Z87891 Personal history of nicotine dependence: Secondary | ICD-10-CM | POA: Diagnosis not present

## 2021-06-08 DIAGNOSIS — Z880 Allergy status to penicillin: Secondary | ICD-10-CM | POA: Insufficient documentation

## 2021-06-08 DIAGNOSIS — Z885 Allergy status to narcotic agent status: Secondary | ICD-10-CM | POA: Insufficient documentation

## 2021-06-08 DIAGNOSIS — I110 Hypertensive heart disease with heart failure: Secondary | ICD-10-CM | POA: Insufficient documentation

## 2021-06-08 DIAGNOSIS — G4733 Obstructive sleep apnea (adult) (pediatric): Secondary | ICD-10-CM | POA: Insufficient documentation

## 2021-06-08 DIAGNOSIS — Z9011 Acquired absence of right breast and nipple: Secondary | ICD-10-CM | POA: Insufficient documentation

## 2021-06-08 DIAGNOSIS — Z882 Allergy status to sulfonamides status: Secondary | ICD-10-CM | POA: Diagnosis not present

## 2021-06-08 DIAGNOSIS — Z853 Personal history of malignant neoplasm of breast: Secondary | ICD-10-CM | POA: Diagnosis not present

## 2021-06-08 DIAGNOSIS — F32A Depression, unspecified: Secondary | ICD-10-CM | POA: Diagnosis not present

## 2021-06-08 DIAGNOSIS — Z886 Allergy status to analgesic agent status: Secondary | ICD-10-CM | POA: Diagnosis not present

## 2021-06-08 DIAGNOSIS — E785 Hyperlipidemia, unspecified: Secondary | ICD-10-CM | POA: Diagnosis not present

## 2021-06-08 DIAGNOSIS — R69 Illness, unspecified: Secondary | ICD-10-CM | POA: Diagnosis not present

## 2021-06-08 DIAGNOSIS — Z86018 Personal history of other benign neoplasm: Secondary | ICD-10-CM | POA: Insufficient documentation

## 2021-06-08 DIAGNOSIS — I5032 Chronic diastolic (congestive) heart failure: Secondary | ICD-10-CM

## 2021-06-08 DIAGNOSIS — C50419 Malignant neoplasm of upper-outer quadrant of unspecified female breast: Secondary | ICD-10-CM | POA: Insufficient documentation

## 2021-06-08 DIAGNOSIS — G20 Parkinson's disease: Secondary | ICD-10-CM | POA: Insufficient documentation

## 2021-06-08 DIAGNOSIS — Z803 Family history of malignant neoplasm of breast: Secondary | ICD-10-CM | POA: Diagnosis not present

## 2021-06-08 DIAGNOSIS — I480 Paroxysmal atrial fibrillation: Secondary | ICD-10-CM | POA: Diagnosis not present

## 2021-06-08 DIAGNOSIS — F419 Anxiety disorder, unspecified: Secondary | ICD-10-CM | POA: Insufficient documentation

## 2021-06-08 DIAGNOSIS — Z79899 Other long term (current) drug therapy: Secondary | ICD-10-CM | POA: Insufficient documentation

## 2021-06-08 DIAGNOSIS — Z888 Allergy status to other drugs, medicaments and biological substances status: Secondary | ICD-10-CM | POA: Diagnosis not present

## 2021-06-08 DIAGNOSIS — E669 Obesity, unspecified: Secondary | ICD-10-CM | POA: Insufficient documentation

## 2021-06-08 DIAGNOSIS — E7800 Pure hypercholesterolemia, unspecified: Secondary | ICD-10-CM | POA: Insufficient documentation

## 2021-06-08 DIAGNOSIS — T501X6A Underdosing of loop [high-ceiling] diuretics, initial encounter: Secondary | ICD-10-CM | POA: Insufficient documentation

## 2021-06-08 DIAGNOSIS — I5033 Acute on chronic diastolic (congestive) heart failure: Secondary | ICD-10-CM | POA: Insufficient documentation

## 2021-06-08 DIAGNOSIS — Z96653 Presence of artificial knee joint, bilateral: Secondary | ICD-10-CM | POA: Insufficient documentation

## 2021-06-08 DIAGNOSIS — Z6832 Body mass index (BMI) 32.0-32.9, adult: Secondary | ICD-10-CM | POA: Insufficient documentation

## 2021-06-08 LAB — BASIC METABOLIC PANEL
Anion gap: 8 (ref 5–15)
BUN: 37 mg/dL — ABNORMAL HIGH (ref 8–23)
CO2: 31 mmol/L (ref 22–32)
Calcium: 9.2 mg/dL (ref 8.9–10.3)
Chloride: 98 mmol/L (ref 98–111)
Creatinine, Ser: 1.39 mg/dL — ABNORMAL HIGH (ref 0.44–1.00)
GFR, Estimated: 40 mL/min — ABNORMAL LOW (ref >60)
Glucose, Bld: 82 mg/dL (ref 70–99)
Potassium: 4.4 mmol/L (ref 3.5–5.1)
Sodium: 137 mmol/L (ref 135–145)

## 2021-06-08 LAB — BRAIN NATRIURETIC PEPTIDE: B Natriuretic Peptide: 122.8 pg/mL — ABNORMAL HIGH (ref 0.0–100.0)

## 2021-06-08 MED ORDER — FUROSEMIDE 10 MG/ML IJ SOLN: 80.0000 mg | Freq: Once | INTRAMUSCULAR | Status: AC

## 2021-06-08 MED ORDER — POTASSIUM CHLORIDE CRYS ER 20 MEQ PO TBCR
EXTENDED_RELEASE_TABLET | ORAL | Status: AC
  Administered 2021-06-08: 40 meq via ORAL
  Filled 2021-06-08: qty 2

## 2021-06-08 MED ORDER — FUROSEMIDE 10 MG/ML IJ SOLN
INTRAMUSCULAR | Status: AC
  Administered 2021-06-08: 80 mg via INTRAVENOUS
  Filled 2021-06-08: qty 8

## 2021-06-08 MED ORDER — POTASSIUM CHLORIDE CRYS ER 20 MEQ PO TBCR: 40.0000 meq | EXTENDED_RELEASE_TABLET | Freq: Once | ORAL | Status: AC

## 2021-06-08 NOTE — Patient Instructions (Signed)
Continue weighing daily and call for an overnight weight gain of > 2 pounds or a weekly weight gain of >5 pounds. 

## 2021-06-08 NOTE — Telephone Encounter (Signed)
Called patient made appointment 11/8. Patient given address to [place] and aware appointment will be there.

## 2021-06-09 NOTE — Progress Notes (Signed)
Patient ID: Selena Bush, female    DOB: 15-Mar-1946, 75 y.o.   MRN: [id number]  HPI  Selena Bush is a 75 y/o female with a history of breast cancer, hyperlipidemia, HNT, anxiety, atrial fibrillation, parkinson's disorder, DDD, depression, GERD, gout, obstructive sleep apnea and chronic heart failure.   Echo report from 04/29/21 reviewed and showed an EF of 65-70% along with mild LVH, moderately elevated PA pressure, severe LAE and possible PFO.  Was in the ED 06/05/21 due to cough, congestion and shortness of breath. Given IV lasix with improvement in symptoms and she was released.    She presents today for a follow-up visit with a chief complaint of moderate fatigue with minimal exertion. She describes this as having been present for several weeks. She has associated cough, shortness of breath, pedal edema, headache and difficulty sleeping along with this. She denies any dizziness, chest pain, palpitations, abdominal distention or weight gain.   She did receive 80mg  IV lasix/ 63meq PO potassium 2 days ago and says that she felt good the next day but then started feeling bad again. Said she had some leg cramps yesterday but when asked about it in more detail she says that her leg wasn't cramping but was actually having some pain around the knee.   Caregiver that is with her brought medication bubble packs but also an additional bottle of potassium. In her bubble pack, is potassium 44meq once daily but she was also taking 64meq tablets from the separate bottle and she was taking that BID so a total of 38meq potassium daily. Caregiver is unable to quantify how long she's been doing this. 26meq bottle is dated from 2020.   Past Medical History:  Diagnosis Date   Anxiety    Atrial fibrillation (HCC)    Chronic diastolic CHF (congestive heart failure) ([place])    a. echo 09/2014: EF 93-71%, diastolic dysfunction, mild LVH, nl RV size & systolic function, mildly dilated LA (4.3 cm), mild MR/TR, mildly  elevated PASP 36.7 mm Hg   DDD (degenerative disc disease), cervical    DDD (degenerative disc disease), lumbar    Depression    Diffuse cystic mastopathy 2014   Dyspnea    Dysrhythmia    GERD (gastroesophageal reflux disease)    Gout    Headache    rare   HLD (hyperlipidemia)    a. statin intolerant 2/2 myalgias   Hx of dysplastic nevus 12/25/2018   L medial ankle   Hypercholesterolemia    Hypertension    Malignant neoplasm of upper-outer quadrant of female breast ([place]) 10/2012   Papillary DCIS, sentinel node negative. Selena/PR positive. PARTIAL RIGHT MASTECTOMY FOR BREAST CANCER--HAD RADIATION - NO CHEMO --Selena. CRYSTAL Bush ONCOLOGIST   Obesity    OSA on CPAP    Osteoarthritis of both knees    a. s/p right TKA 04/2013 & left TKA 09/2014   Otitis externa    Parkinson disease (Selena Bush)    Personal history of radiation therapy 2015   RIGHT breast-mammosite per pt   Sleep difficulties    LUNESTA HAS HELPED   Vaginal cyst     Past Surgical History:  Procedure Laterality Date   ABDOMINAL HYSTERECTOMY  1992   DUB; fibroids; endometriosis.  One remaining ovary.     BREAST LUMPECTOMY Right 2015   Papillary DCIS, sentinel node negative. Selena/PR positive. PARTIAL RIGHT MASTECTOMY FOR BREAST CANCER--HAD RADIATION - NO CHEMO --Selena. CRYSTAL [place] ONCOLOGIST   BREAST SURGERY Right 10/2012   Wide  excision,APB RT 10 mm papillary DCIS, ER/PR positive. Sentinel node negative. Partial breast radiation.   CATARACT EXTRACTION, BILATERAL  02/13/2016   Selena Bush.   CHOLECYSTECTOMY  1994   COLONOSCOPY  2015   1 benign polyp-every 5 years/ Selena Bush   COLONOSCOPY WITH PROPOFOL N/A 02/10/2021   Procedure: COLONOSCOPY WITH PROPOFOL;  Surgeon: Selena Bellow, MD;  Location: [facility];  Service: Endoscopy;  Laterality: N/A;   ERCP  1995   JOINT REPLACEMENT Right 04/2013   knee   PERICARDIOCENTESIS N/A 11/13/2017   Procedure: PERICARDIOCENTESIS;  Surgeon: Selena Bush, MD;  Location: [facility];  Service: Cardiovascular;  Laterality: N/A;   TOTAL KNEE ARTHROPLASTY Right 04/16/2013   Procedure: RIGHT TOTAL KNEE ARTHROPLASTY;  Surgeon: Selena Pole, MD;  Location: [facility];  Service: Orthopedics;  Laterality: Right;   TOTAL KNEE ARTHROPLASTY Left 09/15/2014   Procedure: LEFT TOTAL KNEE ARTHROPLASTY;  Surgeon: Selena Pole, MD;  Location: [facility];  Service: Orthopedics;  Laterality: Left;   TUBAL LIGATION  1979   UPPER GI ENDOSCOPY     Family History  Problem Relation Age of Onset   Colon cancer Mother    Cancer Mother 52       colon cancer   Melanoma Father    Cancer Father 37       melanoma   Colon cancer Maternal Grandfather    Breast cancer Maternal Grandfather    Melanoma Sister    Melanoma Sister    Social History   Tobacco Use   Smoking status: Never   Smokeless tobacco: Never   Tobacco comments:    social smoker as a teen  Substance Use Topics   Alcohol use: No   Allergies  Allergen Reactions   Penicillins Anaphylaxis    anaphylaxis  Has patient had a PCN reaction causing immediate rash, facial/tongue/throat swelling, SOB or lightheadedness with hypotension: Yes Has patient had a PCN reaction causing severe rash involving mucus membranes or skin necrosis: No Has patient had a PCN reaction that required hospitalization: No Has patient had a PCN reaction occurring within the last 10 years: No If all of the above answers are "NO", then may proceed with Cephalosporin use.    Sulfa Antibiotics Anaphylaxis   Codeine Other (See Comments) and Nausea And Vomiting    Gi problems    Statins Other (See Comments)    Leg pains   Aspirin Other (See Comments)    "burned my stomach intensely" Abdominal pain and burning   Prior to Admission medications   Medication Sig Start Date End Date Taking? Authorizing Provider  acetaminophen (TYLENOL) 500 MG tablet Take by mouth.   Yes [provider]  albuterol (VENTOLIN HFA) 108 (90 Base) MCG/ACT  inhaler Inhale 2 puffs into the lungs every 4 (four) hours as needed. 12/07/20  Yes [provider]  carbidopa-levodopa (SINEMET IR) 25-100 MG tablet Take by mouth. Take 2 tablets at 6 AM, 1 at 9AM, 1 tab at 12 PM,  2 at 3 PM, 1 at 6 PM and 1 at 9 PM. 02/12/20  Yes [provider]  diltiazem (CARDIZEM CD) 180 MG 24 hr capsule TAKE ONE TABLET BY MOUTH EVERY MORNING 10/26/20  Yes Lelon Perla, MD  DULoxetine (CYMBALTA) 60 MG capsule TAKE 1 CAPSULE BY MOUTH 2 TIMES A DAY for anxiety and depression. 01/05/21  Yes Pleas Koch, NP  ezetimibe (ZETIA) 10 MG tablet Take 1 tablet (10 mg total) by mouth daily. 07/13/20  Yes Lelon Perla, MD  fluticasone [facility]) 50 MCG/ACT nasal spray Place 2 sprays into both nostrils daily as needed for rhinitis or allergies. 10/06/20  Yes Pleas Koch, NP  furosemide (LASIX) 20 MG tablet Take 1 tablet (20 mg total) by mouth every morning. 06/07/21  Yes Lelon Perla, MD  gabapentin (NEURONTIN) 100 MG capsule TAKE 1 CAPSULE BY MOUTH THREE TIMES A DAY for anxiety. 11/13/19  Yes Pleas Koch, NP  gabapentin (NEURONTIN) 300 MG capsule Take 600 mg by mouth at bedtime. 01/04/21  Yes [provider]  hydrocortisone 2.5 % cream Apply to affected under breasts 1-2 times a day as directed and as needed for itch. Can also use on face as needed for itch 01/05/21  Yes Brendolyn Patty, MD  ketoconazole (NIZORAL) 2 % cream Apply to affected areas under breast twice daily for rash until healed. Can also use on face as needed for dermatitis 01/05/21  Yes Brendolyn Patty, MD  metoprolol tartrate (LOPRESSOR) 25 MG tablet TAKE THREE TABLETS BY MOUTH EVERY MORNING and TAKE THREE TABLETS BY MOUTH EVERY EVENING 01/25/21  Yes Lelon Perla, MD  metroNIDAZOLE (METROCREAM) 0.75 % cream Apply topically 2 (two) times daily. Apply to red areas of face 01/05/21 01/05/22 Yes Brendolyn Patty, MD  potassium chloride (MICRO-K) 10 MEQ CR capsule Take 1 capsule (10  mEq total) by mouth daily. 04/08/21  Yes Loel Dubonnet, NP  sotalol (BETAPACE) 120 MG tablet TAKE ONE TABLET BY MOUTH TWICE DAILY 05/26/21  Yes Lelon Perla, MD  spironolactone (ALDACTONE) 25 MG tablet Take 1 tablet (25 mg total) by mouth daily. 05/21/21 08/19/21 Yes Lendon Colonel, NP  tiZANidine (ZANAFLEX) 4 MG tablet TAKE 2 TABLET (4 MG TOTAL) BY MOUTH DAILY FOR MUSCLE SPASMS AT BEDTIME. Office visit required for further refills. 06/04/21  Yes Pleas Koch, NP    Review of Systems  Constitutional:  Positive for fatigue (easily). Negative for appetite change.  HENT:  Negative for congestion, postnasal drip and sore throat.   Eyes: Negative.   Respiratory:  Positive for cough (productive) and shortness of breath (easily). Negative for chest tightness.   Cardiovascular:  Positive for leg swelling. Negative for chest pain and palpitations.  Gastrointestinal:  Negative for abdominal distention and abdominal pain.  Endocrine: Negative.   Genitourinary: Negative.   Musculoskeletal:  Positive for arthralgias (right knee). Negative for back pain and neck pain.  Skin: Negative.   Allergic/Immunologic: Negative.   Neurological:  Positive for headaches. Negative for dizziness and light-headedness.  Hematological:  Negative for adenopathy. Bruises/bleeds easily.  Psychiatric/Behavioral:  Positive for sleep disturbance (starts coughing when laying down; last night slept in recliner). Negative for dysphoric mood.    Vitals:   06/10/21 1339  BP: 128/74  Pulse: (!) 52  Resp: 16  SpO2: 97%  Weight: 176 lb (79.8 kg)  Height: 5\' 2"  (1.575 m)   Wt Readings from Last 3 Encounters:  06/10/21 176 lb (79.8 kg)  06/08/21 175 lb 8 oz (79.6 kg)  06/05/21 179 lb 10.8 oz (81.5 kg)   Lab Results  Component Value Date   CREATININE 1.50 (H) 06/10/2021   CREATININE 1.39 (H) 06/08/2021   CREATININE 1.25 (H) 06/05/2021   Physical Exam Vitals and nursing note reviewed. Exam conducted with a  chaperone present (caregiver-[name]).  Constitutional:      Appearance: Normal appearance.  HENT:     Head: Normocephalic and atraumatic.  Cardiovascular:     Rate and Rhythm: Regular  rhythm. Bradycardia present.  Pulmonary:     Effort: Pulmonary effort is normal. No respiratory distress.     Breath sounds: No wheezing or rales.  Abdominal:     General: There is no distension.     Palpations: Abdomen is soft.  Musculoskeletal:        General: No tenderness.     Cervical back: Normal range of motion and neck supple.     Right lower leg: Edema (1+ pitting) present.     Left lower leg: Edema (1+ pitting) present.  Skin:    General: Skin is warm and dry.     Findings: Bruising (tops of both hands) present.  Neurological:     Mental Status: She is alert and oriented to person, place, and time. Mental status is at baseline.  Psychiatric:        Mood and Affect: Mood normal.        Behavior: Behavior normal.        Thought Content: Thought content normal.    Assessment & Plan:  1: Chronic heart failure with preserved ejection fraction with structural changes (LVH/LAE)- - NYHA class III - euvolemic today - weighing daily; reminded to call for an overnight weight gain of > 2 pounds or a weekly weight gain of > 5 pounds - weight unchanged from last visit here 2 days ago  - received 80mg  IV lasix/ 21meq PO potassium 2 days ago; felt good the following day but started feeling bad again - check BMP, magnesium today - saw cardiology Gilford Rile) 04/08/21 in [place] - not adding salt to her food; has a live in caregiver and they try and look at food labels; discussed rinsing the canned vegetables prior to cooking - will add jardiance 10mg  daily; 14 day voucher given along with 3 weeks of samples - BMP next visit - patient's caregiver was inadvertently giving patient an extra 69meq potassium BID from separate med bottle in addition to the 53meq dose in her bubble pack - took the old (dated  2020) 23meq potassium bottle and placed in expired med box in the office for destruction; checking BMP today - BNP 06/08/21 was 122.8  2: HTN- - BP looks good (128/74) - saw PCP Carlis Abbott) 01/08/21 - BMP 06/08/21 reviewed and showed sodium 137, potassium 4.4, creatinine 1.39 & GFR 40  3: Atrial fibrillation- - saw cardiology Purcell Nails) 05/21/21 in [place]  4: Sleep apnea- - normally wears CPAP but due to recent coughing, she hasn't been able to wear it as much   Medication bubble pack and separate furosemide and potassium bottles reviewed.   Return in 1 month or sooner for any questions/problems before then.

## 2021-06-10 ENCOUNTER — Other Ambulatory Visit
Admission: RE | Admit: 2021-06-10 | Discharge: 2021-06-10 | Disposition: A | Discharge status: Home / Self Care | Payer: Medicare HMO | Attending: Family | Admitting: Family

## 2021-06-10 ENCOUNTER — Ambulatory Visit (HOSPITAL_BASED_OUTPATIENT_CLINIC_OR_DEPARTMENT_OTHER): Payer: Medicare HMO | Admitting: Family

## 2021-06-10 ENCOUNTER — Other Ambulatory Visit: Payer: Self-pay

## 2021-06-10 ENCOUNTER — Encounter: Payer: Self-pay | Admitting: Family

## 2021-06-10 VITALS — BP 128/74 | HR 52 | Resp 16 | Ht 62.0 in | Wt 176.0 lb

## 2021-06-10 DIAGNOSIS — G4733 Obstructive sleep apnea (adult) (pediatric): Secondary | ICD-10-CM

## 2021-06-10 DIAGNOSIS — I5032 Chronic diastolic (congestive) heart failure: Secondary | ICD-10-CM | POA: Diagnosis not present

## 2021-06-10 DIAGNOSIS — I10 Essential (primary) hypertension: Secondary | ICD-10-CM | POA: Diagnosis not present

## 2021-06-10 DIAGNOSIS — I480 Paroxysmal atrial fibrillation: Secondary | ICD-10-CM

## 2021-06-10 LAB — BASIC METABOLIC PANEL
Anion gap: 6 (ref 5–15)
BUN: 30 mg/dL — ABNORMAL HIGH (ref 8–23)
CO2: 32 mmol/L (ref 22–32)
Calcium: 9.5 mg/dL (ref 8.9–10.3)
Chloride: 97 mmol/L — ABNORMAL LOW (ref 98–111)
Creatinine, Ser: 1.5 mg/dL — ABNORMAL HIGH (ref 0.44–1.00)
GFR, Estimated: 37 mL/min — ABNORMAL LOW (ref >60)
Glucose, Bld: 97 mg/dL (ref 70–99)
Potassium: 5.4 mmol/L — ABNORMAL HIGH (ref 3.5–5.1)
Sodium: 135 mmol/L (ref 135–145)

## 2021-06-10 LAB — MAGNESIUM: Magnesium: 2.2 mg/dL (ref 1.7–2.4)

## 2021-06-10 MED ORDER — EMPAGLIFLOZIN 10 MG PO TABS: 10.0000 mg | ORAL_TABLET | Freq: Every day | ORAL | 0 refills | Status: DC

## 2021-06-10 NOTE — Patient Instructions (Addendum)
Continue weighing daily and call for an overnight weight gain of > 2 pounds or a weekly weight gain of >5 pounds.    Begin Jardiance as 1 tablet every morning

## 2021-06-11 ENCOUNTER — Telehealth: Payer: Self-pay | Admitting: Family

## 2021-06-11 NOTE — Telephone Encounter (Signed)
Called patient's daughter, [name] explaining that we had received a message from a friend of the patient, Earlie Lou who had questions about the patient. Asked [name] if Maretta Bees was someone who we could communicate to because she wasn't listed as a contact for the patient. Heather gave verbal permission to contact Abby and asked that we also put Abby as a contact for the patient.   Called Earlie Lou at [phone]. She says that patient has been taking Magnesium and is now having diarrhea especially after she eats. Advised Abby that we told them yesterday after getting her lab work back that she did not need to take the magnesium so Abby says she will check to make sure that it is not given anymore. Can take Align probiotic to help settle the gut down.   She can also take OTC Mucinex D that does not have sudafed in it. Abby will make sure that is the kind that is being given.   She verbalized understanding.

## 2021-06-15 DIAGNOSIS — F330 Major depressive disorder, recurrent, mild: Secondary | ICD-10-CM | POA: Diagnosis not present

## 2021-06-15 DIAGNOSIS — R69 Illness, unspecified: Secondary | ICD-10-CM | POA: Diagnosis not present

## 2021-06-15 DIAGNOSIS — F4312 Post-traumatic stress disorder, chronic: Secondary | ICD-10-CM | POA: Diagnosis not present

## 2021-06-18 DIAGNOSIS — G20 Parkinson's disease: Secondary | ICD-10-CM | POA: Diagnosis not present

## 2021-06-18 DIAGNOSIS — I4891 Unspecified atrial fibrillation: Secondary | ICD-10-CM | POA: Diagnosis not present

## 2021-06-18 DIAGNOSIS — M1712 Unilateral primary osteoarthritis, left knee: Secondary | ICD-10-CM | POA: Diagnosis not present

## 2021-06-18 DIAGNOSIS — I110 Hypertensive heart disease with heart failure: Secondary | ICD-10-CM | POA: Diagnosis not present

## 2021-06-18 DIAGNOSIS — I509 Heart failure, unspecified: Secondary | ICD-10-CM | POA: Diagnosis not present

## 2021-06-18 DIAGNOSIS — R69 Illness, unspecified: Secondary | ICD-10-CM | POA: Diagnosis not present

## 2021-06-18 DIAGNOSIS — M5136 Other intervertebral disc degeneration, lumbar region: Secondary | ICD-10-CM | POA: Diagnosis not present

## 2021-06-22 ENCOUNTER — Encounter: Payer: Self-pay | Admitting: Primary Care

## 2021-06-22 ENCOUNTER — Other Ambulatory Visit: Payer: Self-pay

## 2021-06-22 ENCOUNTER — Ambulatory Visit (INDEPENDENT_AMBULATORY_CARE_PROVIDER_SITE_OTHER): Payer: Medicare HMO | Admitting: Primary Care

## 2021-06-22 VITALS — BP 118/74 | HR 77 | Temp 97.6°F | Ht 62.0 in | Wt 177.0 lb

## 2021-06-22 DIAGNOSIS — Z0000 Encounter for general adult medical examination without abnormal findings: Secondary | ICD-10-CM

## 2021-06-22 DIAGNOSIS — I10 Essential (primary) hypertension: Secondary | ICD-10-CM

## 2021-06-22 DIAGNOSIS — F5104 Psychophysiologic insomnia: Secondary | ICD-10-CM

## 2021-06-22 DIAGNOSIS — R69 Illness, unspecified: Secondary | ICD-10-CM | POA: Diagnosis not present

## 2021-06-22 DIAGNOSIS — G20 Parkinson's disease: Secondary | ICD-10-CM

## 2021-06-22 DIAGNOSIS — R7303 Prediabetes: Secondary | ICD-10-CM

## 2021-06-22 DIAGNOSIS — I5032 Chronic diastolic (congestive) heart failure: Secondary | ICD-10-CM | POA: Diagnosis not present

## 2021-06-22 DIAGNOSIS — F419 Anxiety disorder, unspecified: Secondary | ICD-10-CM

## 2021-06-22 DIAGNOSIS — E7800 Pure hypercholesterolemia, unspecified: Secondary | ICD-10-CM

## 2021-06-22 DIAGNOSIS — K219 Gastro-esophageal reflux disease without esophagitis: Secondary | ICD-10-CM

## 2021-06-22 DIAGNOSIS — I480 Paroxysmal atrial fibrillation: Secondary | ICD-10-CM

## 2021-06-22 DIAGNOSIS — M62838 Other muscle spasm: Secondary | ICD-10-CM

## 2021-06-22 DIAGNOSIS — M5136 Other intervertebral disc degeneration, lumbar region: Secondary | ICD-10-CM

## 2021-06-22 DIAGNOSIS — G4733 Obstructive sleep apnea (adult) (pediatric): Secondary | ICD-10-CM

## 2021-06-22 DIAGNOSIS — F3341 Major depressive disorder, recurrent, in partial remission: Secondary | ICD-10-CM

## 2021-06-22 DIAGNOSIS — M170 Bilateral primary osteoarthritis of knee: Secondary | ICD-10-CM

## 2021-06-22 LAB — POCT GLYCOSYLATED HEMOGLOBIN (HGB A1C): Hemoglobin A1C: 5.8 % — AB (ref 4.0–5.6)

## 2021-06-22 MED ORDER — TIZANIDINE HCL 4 MG PO TABS: ORAL_TABLET | ORAL | 3 refills | Status: DC

## 2021-06-22 NOTE — Patient Instructions (Signed)
It was a pleasure to see you today!  Preventive Care 64 Years and Older, Female Preventive care refers to lifestyle choices and visits with your health care provider that can promote health and wellness. Preventive care visits are also called wellness exams. What can I expect for my preventive care visit? Counseling Your health care provider may ask you questions about your: Medical history, including: Past medical problems. Family medical history. Pregnancy and menstrual history. History of falls. Current health, including: Memory and ability to understand (cognition). Emotional well-being. Home life and relationship well-being. Sexual activity and sexual health. Lifestyle, including: Alcohol, nicotine or tobacco, and drug use. Access to firearms. Diet, exercise, and sleep habits. Work and work Statistician. Sunscreen use. Safety issues such as seatbelt and bike helmet use. Physical exam Your health care provider will check your: Height and weight. These may be used to calculate your BMI (body mass index). BMI is a measurement that tells if you are at a healthy weight. Waist circumference. This measures the distance around your waistline. This measurement also tells if you are at a healthy weight and may help predict your risk of certain diseases, such as type 2 diabetes and high blood pressure. Heart rate and blood pressure. Body temperature. Skin for abnormal spots. What immunizations do I need? Vaccines are usually given at various ages, according to a schedule. Your health care provider will recommend vaccines for you based on your age, medical history, and lifestyle or other factors, such as travel or where you work. What tests do I need? Screening Your health care provider may recommend screening tests for certain conditions. This may include: Lipid and cholesterol levels. Hepatitis C test. Hepatitis B test. HIV (human immunodeficiency virus) test. STI (sexually  transmitted infection) testing, if you are at risk. Lung cancer screening. Colorectal cancer screening. Diabetes screening. This is done by checking your blood sugar (glucose) after you have not eaten for a while (fasting). Mammogram. Talk with your health care provider about how often you should have regular mammograms. BRCA-related cancer screening. This may be done if you have a family history of breast, ovarian, tubal, or peritoneal cancers. Bone density scan. This is done to screen for osteoporosis. Talk with your health care provider about your test results, treatment options, and if necessary, the need for more tests. Follow these instructions at home: Eating and drinking  Eat a diet that includes fresh fruits and vegetables, whole grains, lean protein, and low-fat dairy products. Limit your intake of foods with high amounts of sugar, saturated fats, and salt. Take vitamin and mineral supplements as recommended by your health care provider. Do not drink alcohol if your health care provider tells you not to drink. If you drink alcohol: Limit how much you have to 0-1 drink a day. Know how much alcohol is in your drink. In the [place] one drink equals one 12 oz bottle of beer (355 mL), one 5 oz glass of wine (148 mL), or one 1 oz glass of hard liquor (44 mL). Lifestyle Brush your teeth every morning and night with fluoride toothpaste. Floss one time each day. Exercise for at least 30 minutes 5 or more days each week. Do not use any products that contain nicotine or tobacco. These products include cigarettes, chewing tobacco, and vaping devices, such as e-cigarettes. If you need help quitting, ask your health care provider. Do not use drugs. If you are sexually active, practice safe sex. Use a condom or other form of protection in order to  prevent STIs. Take aspirin only as told by your health care provider. Make sure that you understand how much to take and what form to take. Work with your  health care provider to find out whether it is safe and beneficial for you to take aspirin daily. Ask your health care provider if you need to take a cholesterol-lowering medicine (statin). Find healthy ways to manage stress, such as: Meditation, yoga, or listening to music. Journaling. Talking to a trusted person. Spending time with friends and family. Minimize exposure to UV radiation to reduce your risk of skin cancer. Safety Always wear your seat belt while driving or riding in a vehicle. Do not drive: If you have been drinking alcohol. Do not ride with someone who has been drinking. When you are tired or distracted. While texting. If you have been using any mind-altering substances or drugs. Wear a helmet and other protective equipment during sports activities. If you have firearms in your house, make sure you follow all gun safety procedures. What's next? Visit your health care provider once a year for an annual wellness visit. Ask your health care provider how often you should have your eyes and teeth checked. Stay up to date on all vaccines. This information is not intended to replace advice given to you by your health care provider. Make sure you discuss any questions you have with your health care provider. Document Revised: 01/27/2021 Document Reviewed: 01/27/2021 Elsevier Patient Education  [place].

## 2021-06-22 NOTE — Progress Notes (Signed)
Subjective:    Patient ID: Selena Bush, female    DOB: 11-09-1945, 75 y.o.   MRN: [id number]  HPI  Selena Bush is a very pleasant 75 y.o. female who presents today for complete physical and follow up of chronic conditions.  Immunizations: -Tetanus: 2020 -Influenza: Completed this season  -Covid-19: 4 vaccines  -Shingles: Completed series  -Pneumonia: Prevnar 13 in 2017, Pneumovax in 2016   Diet: [place].  Exercise: No regular exercise.  Eye exam: No recent exam, scheduled for December  Dental exam: Completes semi-annually   Mammogram: Completed in April 2022 Dexa: Declines  Colonoscopy: Completed in 2022, due 2027  Wt Readings from Last 3 Encounters:  06/22/21 177 lb (80.3 kg)  06/10/21 176 lb (79.8 kg)  06/08/21 175 lb 8 oz (79.6 kg)       Review of Systems  Constitutional:  Negative for unexpected weight change.  HENT:  Negative for rhinorrhea.   Respiratory:  Positive for shortness of breath.   Cardiovascular:  Negative for chest pain.  Gastrointestinal:  Negative for constipation and diarrhea.  Genitourinary:  Negative for difficulty urinating.  Musculoskeletal:  Positive for arthralgias. Negative for myalgias.  Skin:  Negative for rash.  Allergic/Immunologic: Negative for environmental allergies.  Neurological:  Negative for dizziness and headaches.  Psychiatric/Behavioral:  The patient is nervous/anxious.         Past Medical History:  Diagnosis Date   Anxiety    Atrial fibrillation (HCC)    Chronic diastolic CHF (congestive heart failure) ([place])    a. echo 09/2014: EF 92-11%, diastolic dysfunction, mild LVH, nl RV size & systolic function, mildly dilated LA (4.3 cm), mild MR/TR, mildly elevated PASP 36.7 mm Hg   DDD (degenerative disc disease), cervical    DDD (degenerative disc disease), lumbar    Depression    Diffuse cystic mastopathy 2014   Dyspnea    Dysrhythmia    GERD (gastroesophageal reflux disease)    Gout    Headache     rare   HLD (hyperlipidemia)    a. statin intolerant 2/2 myalgias   Hx of dysplastic nevus 12/25/2018   L medial ankle   Hypercholesterolemia    Hypertension    Malignant neoplasm of upper-outer quadrant of female breast ([place]) 10/2012   Papillary DCIS, sentinel node negative. DR/PR positive. PARTIAL RIGHT MASTECTOMY FOR BREAST CANCER--HAD RADIATION - NO CHEMO --DR. CRYSTAL [place] ONCOLOGIST   Obesity    OSA on CPAP    Osteoarthritis of both knees    a. s/p right TKA 04/2013 & left TKA 09/2014   Otitis externa    Parkinson disease ([place])    Personal history of radiation therapy 2015   RIGHT breast-mammosite per pt   Sleep difficulties    LUNESTA HAS HELPED   Vaginal cyst     Social History   Socioeconomic History   Marital status: Widowed    Spouse name: Selena Bush   Number of children: 2   Years of education: College   Highest education level: Bachelor's degree (e.g., BA, AB, BS)  Occupational History   Occupation: Retired  Tobacco Use   Smoking status: Never   Smokeless tobacco: Never   Tobacco comments:    social smoker as a teen  Scientific laboratory technician Use: Never used  Substance and Sexual Activity   Alcohol use: No   Drug use: No   Sexual activity: Not Currently    Birth control/protection: Post-menopausal, Surgical  Other Topics Concern  Not on file  Social History Narrative   Marital status: widowed since 09/16/2015      Children: 2 children (74, 41); 5 grandchild (4 in [place]; 1 in [place]).      Lives: alone in townhome; 1 dog, 2 cats      Employment: psychiatric Education officer, museum; retired in 2015      Tobacco: teenager only      Alcohol:  None      Drugs: none      Exercise: walking in 2019; more active in 2019.  Walking daily small amounts.        ADLs: drives; independent with ADLs; no assistant devices      Advanced Directives: none; FULL CODE; no prolonged measures.   Does not need them.  Daughter/Heather [place] Deck is HCPOA.    Social Determinants of Health    Financial Resource Strain: High Risk   Difficulty of Paying Living Expenses: Hard  Food Insecurity: Not on file  Transportation Needs: Not on file  Physical Activity: Not on file  Stress: Not on file  Social Connections: Not on file  Intimate Partner Violence: Not on file    Past Surgical History:  Procedure Laterality Date   ABDOMINAL HYSTERECTOMY  1992   DUB; fibroids; endometriosis.  One remaining ovary.     BREAST LUMPECTOMY Right 2015   Papillary DCIS, sentinel node negative. DR/PR positive. PARTIAL RIGHT MASTECTOMY FOR BREAST CANCER--HAD RADIATION - NO CHEMO --DR. CRYSTAL [place] ONCOLOGIST   BREAST SURGERY Right 10/2012   Wide excision,APB RT 10 mm papillary DCIS, ER/PR positive. Sentinel node negative. Partial breast radiation.   CATARACT EXTRACTION, BILATERAL  02/13/2016   [facility].   CHOLECYSTECTOMY  1994   COLONOSCOPY  2015   1 benign polyp-every 5 years/ Dr Candace Cruise   COLONOSCOPY WITH PROPOFOL N/A 02/10/2021   Procedure: COLONOSCOPY WITH PROPOFOL;  Surgeon: Robert Bellow, MD;  Location: [facility] ENDOSCOPY;  Service: Endoscopy;  Laterality: N/A;   ERCP  1995   JOINT REPLACEMENT Right 04/2013   knee   PERICARDIOCENTESIS N/A 11/13/2017   Procedure: PERICARDIOCENTESIS;  Surgeon: Nelva Bush, MD;  Location: [facility];  Service: Cardiovascular;  Laterality: N/A;   TOTAL KNEE ARTHROPLASTY Right 04/16/2013   Procedure: RIGHT TOTAL KNEE ARTHROPLASTY;  Surgeon: Mauri Pole, MD;  Location: [facility] OR[facility];  Service: Orthopedics;  Laterality: Right;   TOTAL KNEE ARTHROPLASTY Left 09/15/2014   Procedure: LEFT TOTAL KNEE ARTHROPLASTY;  Surgeon: Mauri Pole, MD;  Location: [facility] OR[facility];  Service: Orthopedics;  Laterality: Left;   TUBAL LIGATION  1979   UPPER GI ENDOSCOPY      Family History  Problem Relation Age of Onset   Colon cancer Mother    Cancer Mother 46       colon cancer   Melanoma Father    Cancer Father 44       melanoma   Colon cancer Maternal Grandfather     Breast cancer Maternal Grandfather    Melanoma Sister    Melanoma Sister     Allergies  Allergen Reactions   Penicillins Anaphylaxis    anaphylaxis  Has patient had a PCN reaction causing immediate rash, facial/tongue/throat swelling, SOB or lightheadedness with hypotension: Yes Has patient had a PCN reaction causing severe rash involving mucus membranes or skin necrosis: No Has patient had a PCN reaction that required hospitalization: No Has patient had a PCN reaction occurring within the last 10 years: No If all of the above answers are "NO", then may  proceed with Cephalosporin use.    Sulfa Antibiotics Anaphylaxis   Codeine Other (See Comments) and Nausea And Vomiting    Gi problems    Statins Other (See Comments)    Leg pains   Aspirin Other (See Comments)    "burned my stomach intensely" Abdominal pain and burning    Current Outpatient Medications on File Prior to Visit  Medication Sig Dispense Refill   acetaminophen (TYLENOL) 500 MG tablet Take by mouth.     albuterol (VENTOLIN HFA) 108 (90 Base) MCG/ACT inhaler Inhale 2 puffs into the lungs every 4 (four) hours as needed.     carbidopa-levodopa (SINEMET IR) 25-100 MG tablet Take by mouth. Take 2 tablets at 6 AM, 1 at 9AM, 1 tab at 12 PM,  2 at 3 PM, 1 at 6 PM and 1 at 9 PM.     diltiazem (CARDIZEM CD) 180 MG 24 hr capsule TAKE ONE TABLET BY MOUTH EVERY MORNING 90 capsule 2   DULoxetine (CYMBALTA) 60 MG capsule TAKE 1 CAPSULE BY MOUTH 2 TIMES A DAY for anxiety and depression. 180 capsule 1   empagliflozin (JARDIANCE) 10 MG TABS tablet Take 1 tablet (10 mg total) by mouth daily before breakfast. 14 tablet 0   ezetimibe (ZETIA) 10 MG tablet Take 1 tablet (10 mg total) by mouth daily. 90 tablet 3   fluticasone (FLONASE) 50 MCG/ACT nasal spray Place 2 sprays into both nostrils daily as needed for rhinitis or allergies. 16 g 5   furosemide (LASIX) 20 MG tablet Take 1 tablet (20 mg total) by mouth every morning. 90 tablet 3    gabapentin (NEURONTIN) 100 MG capsule TAKE 1 CAPSULE BY MOUTH THREE TIMES A DAY for anxiety. 270 capsule 1   gabapentin (NEURONTIN) 300 MG capsule Take 600 mg by mouth at bedtime.     hydrocortisone 2.5 % cream Apply to affected under breasts 1-2 times a day as directed and as needed for itch. Can also use on face as needed for itch 28 g 3   ketoconazole (NIZORAL) 2 % cream Apply to affected areas under breast twice daily for rash until healed. Can also use on face as needed for dermatitis 30 g 5   metoprolol tartrate (LOPRESSOR) 25 MG tablet TAKE THREE TABLETS BY MOUTH EVERY MORNING and TAKE THREE TABLETS BY MOUTH EVERY EVENING 235 tablet 3   metroNIDAZOLE (METROCREAM) 0.75 % cream Apply topically 2 (two) times daily. Apply to red areas of face 45 g 3   potassium chloride (MICRO-K) 10 MEQ CR capsule Take 1 capsule (10 mEq total) by mouth daily. 30 capsule 1   sotalol (BETAPACE) 120 MG tablet TAKE ONE TABLET BY MOUTH TWICE DAILY 180 tablet 3   spironolactone (ALDACTONE) 25 MG tablet Take 1 tablet (25 mg total) by mouth daily. 90 tablet 2   tiZANidine (ZANAFLEX) 4 MG tablet TAKE 2 TABLET (4 MG TOTAL) BY MOUTH DAILY FOR MUSCLE SPASMS AT BEDTIME. Office visit required for further refills. 60 tablet 0   No current facility-administered medications on file prior to visit.    BP 118/74   Pulse 77   Temp 97.6 F (36.4 C) (Temporal)   Ht 5\' 2"  (1.575 m)   Wt 177 lb (80.3 kg)   SpO2 97%   BMI 32.37 kg/m  Objective:   Physical Exam HENT:     Right Ear: Tympanic membrane and ear canal normal.     Left Ear: Tympanic membrane and ear canal normal.     Nose:  Nose normal.  Eyes:     Conjunctiva/sclera: Conjunctivae normal.     Pupils: Pupils are equal, round, and reactive to light.  Neck:     Thyroid: No thyromegaly.  Cardiovascular:     Rate and Rhythm: Normal rate and regular rhythm.     Heart sounds: No murmur heard. Pulmonary:     Effort: Pulmonary effort is normal.     Breath sounds:  Normal breath sounds. No rales.  Abdominal:     General: Bowel sounds are normal.     Palpations: Abdomen is soft.     Tenderness: There is no abdominal tenderness.  Musculoskeletal:     Cervical back: Neck supple.     Comments: Generalized decrease in ROM, using walker with stable gait.   Lymphadenopathy:     Cervical: No cervical adenopathy.  Skin:    General: Skin is warm and dry.     Findings: No rash.  Neurological:     Mental Status: She is alert and oriented to person, place, and time.     Cranial Nerves: No cranial nerve deficit.     Deep Tendon Reflexes: Reflexes are normal and symmetric.  Psychiatric:        Mood and Affect: Mood normal.          Assessment & Plan:      This visit occurred during the SARS-CoV-2 public health emergency.  Safety protocols were in place, including screening questions prior to the visit, additional usage of staff PPE, and extensive cleaning of exam room while observing appropriate contact time as indicated for disinfecting solutions.

## 2021-06-22 NOTE — Assessment & Plan Note (Signed)
Denies concerns today, infrequent use of OTC products. Continue to monitor.

## 2021-06-22 NOTE — Assessment & Plan Note (Signed)
Overall doing well on gabapentin 300 mg HS and 100 mg TID.

## 2021-06-22 NOTE — Assessment & Plan Note (Signed)
Overall stable, continue Cymbalta 60 mg BID, gabapentin 100 TID, and gabapentin 300 mg HS.   Continue therapy.

## 2021-06-22 NOTE — Assessment & Plan Note (Signed)
Rate and rhythm regular today.   Continue diltiazem CD 180 mg daily, metoprolol tartrate 75 mg BID, sotalol 120 mg BID. Not on anticoagulation.  Following with cardiology

## 2021-06-22 NOTE — Assessment & Plan Note (Signed)
Well controlled today. Continue metoprolol tartrate 75 mg BID, sotalol 120 mg BID, spironolactone 25 mg daily, diltiazem CD 180 mg daily.   Following with cardiology.

## 2021-06-22 NOTE — Assessment & Plan Note (Signed)
Appears overall euvolemic today, mild ankle edema.  Home and office weights are stable.   Continue Jardiance 10 mg, Lasix 20 mg daily, potassium 10 mEq, spironolactone 25 mg daily.   Following with heart failure clinic, office visits from October 2022 reviewed.

## 2021-06-22 NOTE — Assessment & Plan Note (Addendum)
Stable. A1C today of 5.8.  Continue to monitor.

## 2021-06-22 NOTE — Assessment & Plan Note (Signed)
Immunizations UTD. Mammogram UTD. Colonoscopy UTD, maybe due in 2027? Declines bone density scan.  Discussed the importance of a healthy diet and regular exercise in order for weight loss, and to reduce the risk of further co-morbidity.  Exam today stable. Labs reviewed from CHF clinic visit and also pending today

## 2021-06-22 NOTE — Assessment & Plan Note (Signed)
Compliant to CPAP machine nightly, continue same. 

## 2021-06-22 NOTE — Assessment & Plan Note (Signed)
Stable.  Continue duloxetine 60 mg twice daily, tizanidine 8 mg daily.  She has done well on this regimen for years.

## 2021-06-22 NOTE — Assessment & Plan Note (Addendum)
Following with neurology, Dr. Melrose Nakayama, continue Sinemet IR 25-100 as prescribed, continue gabapentin 300 mg HS and 100 mg TID. Continue Tizanidine 8 mg daily.   Office notes from August 2022 reviewed.

## 2021-06-22 NOTE — Assessment & Plan Note (Signed)
Stable.  Continue duloxetine 60 mg BID, gabapentin per neurology, tizanidine 8 mg daily.

## 2021-06-22 NOTE — Assessment & Plan Note (Signed)
Lipid panel from May 2022 improved! Continue Zetia 10 mg

## 2021-06-23 ENCOUNTER — Other Ambulatory Visit: Payer: Self-pay | Admitting: Cardiology

## 2021-06-23 ENCOUNTER — Other Ambulatory Visit (HOSPITAL_BASED_OUTPATIENT_CLINIC_OR_DEPARTMENT_OTHER): Payer: Self-pay | Admitting: Family

## 2021-06-23 DIAGNOSIS — E7800 Pure hypercholesterolemia, unspecified: Secondary | ICD-10-CM

## 2021-06-24 ENCOUNTER — Ambulatory Visit: Payer: Medicare HMO | Attending: Family | Admitting: Family

## 2021-06-24 ENCOUNTER — Other Ambulatory Visit: Payer: Self-pay

## 2021-06-24 ENCOUNTER — Encounter: Payer: Self-pay | Admitting: Family

## 2021-06-24 ENCOUNTER — Telehealth: Payer: Self-pay

## 2021-06-24 ENCOUNTER — Telehealth: Payer: Self-pay | Admitting: Family

## 2021-06-24 VITALS — BP 141/70 | HR 56 | Resp 16 | Ht 61.0 in | Wt 178.0 lb

## 2021-06-24 DIAGNOSIS — I110 Hypertensive heart disease with heart failure: Secondary | ICD-10-CM | POA: Diagnosis not present

## 2021-06-24 DIAGNOSIS — F419 Anxiety disorder, unspecified: Secondary | ICD-10-CM | POA: Diagnosis not present

## 2021-06-24 DIAGNOSIS — K219 Gastro-esophageal reflux disease without esophagitis: Secondary | ICD-10-CM | POA: Diagnosis not present

## 2021-06-24 DIAGNOSIS — R059 Cough, unspecified: Secondary | ICD-10-CM | POA: Diagnosis not present

## 2021-06-24 DIAGNOSIS — I480 Paroxysmal atrial fibrillation: Secondary | ICD-10-CM | POA: Diagnosis not present

## 2021-06-24 DIAGNOSIS — R251 Tremor, unspecified: Secondary | ICD-10-CM | POA: Diagnosis not present

## 2021-06-24 DIAGNOSIS — Z7901 Long term (current) use of anticoagulants: Secondary | ICD-10-CM | POA: Insufficient documentation

## 2021-06-24 DIAGNOSIS — I4891 Unspecified atrial fibrillation: Secondary | ICD-10-CM | POA: Diagnosis not present

## 2021-06-24 DIAGNOSIS — I10 Essential (primary) hypertension: Secondary | ICD-10-CM

## 2021-06-24 DIAGNOSIS — F32A Depression, unspecified: Secondary | ICD-10-CM | POA: Diagnosis not present

## 2021-06-24 DIAGNOSIS — Z9989 Dependence on other enabling machines and devices: Secondary | ICD-10-CM | POA: Insufficient documentation

## 2021-06-24 DIAGNOSIS — I5032 Chronic diastolic (congestive) heart failure: Secondary | ICD-10-CM | POA: Diagnosis not present

## 2021-06-24 DIAGNOSIS — Z7984 Long term (current) use of oral hypoglycemic drugs: Secondary | ICD-10-CM | POA: Insufficient documentation

## 2021-06-24 DIAGNOSIS — G20 Parkinson's disease: Secondary | ICD-10-CM | POA: Insufficient documentation

## 2021-06-24 DIAGNOSIS — Z853 Personal history of malignant neoplasm of breast: Secondary | ICD-10-CM | POA: Insufficient documentation

## 2021-06-24 DIAGNOSIS — R262 Difficulty in walking, not elsewhere classified: Secondary | ICD-10-CM | POA: Diagnosis not present

## 2021-06-24 DIAGNOSIS — G4733 Obstructive sleep apnea (adult) (pediatric): Secondary | ICD-10-CM | POA: Insufficient documentation

## 2021-06-24 DIAGNOSIS — R69 Illness, unspecified: Secondary | ICD-10-CM | POA: Diagnosis not present

## 2021-06-24 DIAGNOSIS — Z79899 Other long term (current) drug therapy: Secondary | ICD-10-CM | POA: Diagnosis not present

## 2021-06-24 NOTE — Progress Notes (Signed)
Patient ID: Selena Bush, female    DOB: 1946/05/24, 75 y.o.   MRN: [id number]  HPI  Selena Bush is a 75 y/o female with a history of breast cancer, hyperlipidemia, HNT, anxiety, atrial fibrillation, parkinson's disorder, DDD, depression, GERD, gout, obstructive sleep apnea and chronic heart failure.   Echo report from 04/29/21 reviewed and showed an EF of 65-70% along with mild LVH, moderately elevated PA pressure, severe LAE and possible PFO.  Was in the ED 06/05/21 due to cough, congestion and shortness of breath. Given IV lasix with improvement in symptoms and she was released.    She presents today for an urgent visit with a chief complaint of overnight weight gain of almost 4 pounds. She has associated fatigue (although better), cough, pedal edema ("not much") & chronic difficulty sleeping along with this. She denies any dizziness, abdominal distention, palpitations, chest pain or shortness of breath.   Her caregiver that is present with her says that she thinks she's not been looking at the labels correctly. The caregiver says that she's been looking at the sodium % and not the mg amount and feels like that is where she has messed up. She now understands that she needs to look at the mg and keep it under 2000mg / day.   Patient and caregiver both feel like she has improved since Jardiance was started. She feels like she has more energy and isn't short of breath at all.   Past Medical History:  Diagnosis Date   Anxiety    Atrial fibrillation ([place])    Cat bite of right hand 03/25/2019   Chronic diastolic CHF (congestive heart failure) ([place])    a. echo 09/2014: EF 44-81%, diastolic dysfunction, mild LVH, nl RV size & systolic function, mildly dilated LA (4.3 cm), mild MR/TR, mildly elevated PASP 36.7 mm Hg   DDD (degenerative disc disease), cervical    DDD (degenerative disc disease), lumbar    Depression    Diffuse cystic mastopathy 2014   Dyspnea    Dysrhythmia    GERD  (gastroesophageal reflux disease)    Gout    Headache    rare   HLD (hyperlipidemia)    a. statin intolerant 2/2 myalgias   Hx of dysplastic nevus 12/25/2018   L medial ankle   Hypercholesterolemia    Hypertension    Malignant neoplasm of upper-outer quadrant of female breast ([place]) 10/2012   Papillary DCIS, sentinel node negative. DR/PR positive. PARTIAL RIGHT MASTECTOMY FOR BREAST CANCER--HAD RADIATION - NO CHEMO --DR. CRYSTAL [place] ONCOLOGIST   Obesity    OSA on CPAP    Osteoarthritis of both knees    a. s/p right TKA 04/2013 & left TKA 09/2014   Otitis externa    Parkinson disease [facility])    Personal history of radiation therapy 2015   RIGHT breast-mammosite per pt   Pneumonia of both lungs due to infectious organism 01/08/2021   Sleep difficulties    LUNESTA HAS HELPED   Vaginal cyst     Past Surgical History:  Procedure Laterality Date   ABDOMINAL HYSTERECTOMY  1992   DUB; fibroids; endometriosis.  One remaining ovary.     BREAST LUMPECTOMY Right 2015   Papillary DCIS, sentinel node negative. DR/PR positive. PARTIAL RIGHT MASTECTOMY FOR BREAST CANCER--HAD RADIATION - NO CHEMO --DR. CRYSTAL [place] ONCOLOGIST   BREAST SURGERY Right 10/2012   Wide excision,APB RT 10 mm papillary DCIS, ER/PR positive. Sentinel node negative. Partial breast radiation.   CATARACT EXTRACTION, BILATERAL  02/13/2016  [facility].   CHOLECYSTECTOMY  1994   COLONOSCOPY  2015   1 benign polyp-every 5 years/ Dr Candace Cruise   COLONOSCOPY WITH PROPOFOL N/A 02/10/2021   Procedure: COLONOSCOPY WITH PROPOFOL;  Surgeon: Robert Bellow, MD;  Location: [facility] ENDOSCOPY;  Service: Endoscopy;  Laterality: N/A;   ERCP  1995   JOINT REPLACEMENT Right 04/2013   knee   PERICARDIOCENTESIS N/A 11/13/2017   Procedure: PERICARDIOCENTESIS;  Surgeon: Nelva Bush, MD;  Location: [place] CV LAB;  Service: Cardiovascular;  Laterality: N/A;   TOTAL KNEE ARTHROPLASTY Right 04/16/2013   Procedure: RIGHT TOTAL KNEE  ARTHROPLASTY;  Surgeon: Mauri Pole, MD;  Location: [facility] ORS;  Service: Orthopedics;  Laterality: Right;   TOTAL KNEE ARTHROPLASTY Left 09/15/2014   Procedure: LEFT TOTAL KNEE ARTHROPLASTY;  Surgeon: Mauri Pole, MD;  Location: [facility] ORS;  Service: Orthopedics;  Laterality: Left;   TUBAL LIGATION  1979   UPPER GI ENDOSCOPY     Family History  Problem Relation Age of Onset   Colon cancer Mother    Cancer Mother 25       colon cancer   Melanoma Father    Cancer Father 47       melanoma   Colon cancer Maternal Grandfather    Breast cancer Maternal Grandfather    Melanoma Sister    Melanoma Sister    Social History   Tobacco Use   Smoking status: Never   Smokeless tobacco: Never   Tobacco comments:    social smoker as a teen  Substance Use Topics   Alcohol use: No   Allergies  Allergen Reactions   Penicillins Anaphylaxis    anaphylaxis  Has patient had a PCN reaction causing immediate rash, facial/tongue/throat swelling, SOB or lightheadedness with hypotension: Yes Has patient had a PCN reaction causing severe rash involving mucus membranes or skin necrosis: No Has patient had a PCN reaction that required hospitalization: No Has patient had a PCN reaction occurring within the last 10 years: No If all of the above answers are "NO", then may proceed with Cephalosporin use.    Sulfa Antibiotics Anaphylaxis   Codeine Other (See Comments) and Nausea And Vomiting    Gi problems    Statins Other (See Comments)    Leg pains   Aspirin Other (See Comments)    "burned my stomach intensely" Abdominal pain and burning   Prior to Admission medications   Medication Sig Start Date End Date Taking? Authorizing Provider  acetaminophen (TYLENOL) 500 MG tablet Take by mouth.   Yes [provider]  albuterol (VENTOLIN HFA) 108 (90 Base) MCG/ACT inhaler Inhale 2 puffs into the lungs every 4 (four) hours as needed. 12/07/20  Yes [provider]  carbidopa-levodopa (SINEMET  IR) 25-100 MG tablet Take by mouth. Take 2 tablets at 6 AM, 1 at 9AM, 1 tab at 12 PM,  2 at 3 PM, 1 at 6 PM and 1 at 9 PM. 02/12/20  Yes [provider]  diltiazem (CARDIZEM CD) 180 MG 24 hr capsule TAKE ONE TABLET BY MOUTH EVERY MORNING 10/26/20  Yes Lelon Perla, MD  DULoxetine (CYMBALTA) 60 MG capsule TAKE 1 CAPSULE BY MOUTH 2 TIMES A DAY for anxiety and depression. 01/05/21  Yes Pleas Koch, NP  empagliflozin (JARDIANCE) 10 MG TABS tablet Take 1 tablet (10 mg total) by mouth daily before breakfast. 06/10/21  Yes Darylene Price A, FNP  ezetimibe (ZETIA) 10 MG tablet TAKE ONE TABLET BY MOUTH EVERY MORNING 06/23/21  Yes Lelon Perla, MD  fluticasone [facility]) 50 MCG/ACT nasal spray Place 2 sprays into both nostrils daily as needed for rhinitis or allergies. 10/06/20  Yes Pleas Koch, NP  furosemide (LASIX) 20 MG tablet Take 1 tablet (20 mg total) by mouth every morning. 06/07/21  Yes Lelon Perla, MD  gabapentin (NEURONTIN) 100 MG capsule TAKE 1 CAPSULE BY MOUTH THREE TIMES A DAY for anxiety. 11/13/19  Yes Pleas Koch, NP  gabapentin (NEURONTIN) 300 MG capsule Take 600 mg by mouth at bedtime. 01/04/21  Yes [provider]  hydrocortisone 2.5 % cream Apply to affected under breasts 1-2 times a day as directed and as needed for itch. Can also use on face as needed for itch 01/05/21  Yes Brendolyn Patty, MD  ketoconazole (NIZORAL) 2 % cream Apply to affected areas under breast twice daily for rash until healed. Can also use on face as needed for dermatitis 01/05/21  Yes Brendolyn Patty, MD  metoprolol tartrate (LOPRESSOR) 25 MG tablet TAKE THREE TABLETS BY MOUTH TWICE DAILY 06/23/21  Yes Lelon Perla, MD  metroNIDAZOLE (METROCREAM) 0.75 % cream Apply topically 2 (two) times daily. Apply to red areas of face 01/05/21 01/05/22 Yes Brendolyn Patty, MD  potassium chloride (MICRO-K) 10 MEQ CR capsule TAKE ONE CAPSULE BY MOUTH EVERY MORNING 06/23/21  Yes Loel Dubonnet,  NP  sotalol (BETAPACE) 120 MG tablet TAKE ONE TABLET BY MOUTH TWICE DAILY 05/26/21  Yes Lelon Perla, MD  spironolactone (ALDACTONE) 25 MG tablet Take 1 tablet (25 mg total) by mouth daily. 05/21/21 08/19/21 Yes Lendon Colonel, NP  tiZANidine (ZANAFLEX) 4 MG tablet TAKE 2 TABLET (4 MG TOTAL) BY MOUTH DAILY FOR MUSCLE SPASMS AT BEDTIME. 06/22/21  Yes Pleas Koch, NP    Review of Systems  Constitutional:  Positive for fatigue ("improving"). Negative for appetite change.  HENT:  Negative for congestion, postnasal drip and sore throat.   Eyes: Negative.   Respiratory:  Positive for cough (productive). Negative for chest tightness and shortness of breath.   Cardiovascular:  Positive for leg swelling. Negative for chest pain and palpitations.  Gastrointestinal:  Negative for abdominal distention and abdominal pain.  Endocrine: Negative.   Genitourinary: Negative.   Musculoskeletal:  Positive for arthralgias (right knee). Negative for back pain and neck pain.  Skin: Negative.   Allergic/Immunologic: Negative.   Neurological:  Negative for dizziness, light-headedness and headaches.  Hematological:  Negative for adenopathy. Bruises/bleeds easily.  Psychiatric/Behavioral:  Positive for sleep disturbance (sleeping in recliner). Negative for dysphoric mood.    Vitals:   06/24/21 1009  BP: (!) 141/70  Pulse: (!) 56  Resp: 16  SpO2: 100%  Weight: 178 lb (80.7 kg)  Height: 5\' 1"  (1.549 m)   Wt Readings from Last 3 Encounters:  06/24/21 178 lb (80.7 kg)  06/22/21 177 lb (80.3 kg)  06/10/21 176 lb (79.8 kg)   Lab Results  Component Value Date   CREATININE 1.50 (H) 06/10/2021   CREATININE 1.39 (H) 06/08/2021   CREATININE 1.25 (H) 06/05/2021   Physical Exam Vitals and nursing note reviewed. Exam conducted with a chaperone present (caregiver-[name]).  Constitutional:      Appearance: Normal appearance.  HENT:     Head: Normocephalic and atraumatic.  Cardiovascular:     Rate  and Rhythm: Regular rhythm. Bradycardia present.  Pulmonary:     Effort: Pulmonary effort is normal. No respiratory distress.     Breath sounds: No wheezing or rales.  Abdominal:  General: There is no distension.     Palpations: Abdomen is soft.  Musculoskeletal:        General: No tenderness.     Cervical back: Normal range of motion and neck supple.     Right lower leg: No edema.     Left lower leg: Edema (1+ pitting around ankle) present.  Skin:    General: Skin is warm and dry.     Findings: Bruising (tops of both hands) present.  Neurological:     Mental Status: She is alert and oriented to person, place, and time. Mental status is at baseline.  Psychiatric:        Mood and Affect: Mood normal.        Behavior: Behavior normal.        Thought Content: Thought content normal.    Assessment & Plan:  1: Chronic heart failure with preserved ejection fraction with structural changes (LVH/LAE)- - NYHA class II - euvolemic today - weighing daily; reminded to call for an overnight weight gain of > 2 pounds or a weekly weight gain of > 5 pounds - home weight chart reviewed and did show almost 4 pounds overnight weight gain but it's trending back down now - weight up 2 pounds from last visit here 2 weeks ago  - advised patient and caregiver that if her weight was the same tomorrow that she should take an additional 20mg  furosemide - saw cardiology Gilford Rile) 04/08/21 in [place] - not adding salt to her food; has a live in caregiver and they try and look at food labels but they realize that they have been looking at the labels incorrectly; now understands to keep daily sodium intake to 2000mg / day - on GDMT of SGLT2; will need to watch renal function & add entresto in the future if able - BNP 06/08/21 was 122.8  2: HTN- - BP mildly elevated (141/70) - saw PCP Carlis Abbott) 06/22/21 - BMP 06/10/21 reviewed and showed sodium 135, potassium 5.4, creatinine 1.5 & GFR 37  3: Atrial  fibrillation- - saw cardiology Purcell Nails) 05/21/21 in [place]  4: Sleep apnea- - normally wears CPAP but due to recent coughing, she hasn't been able to wear it as much   Medication list reviewed.   Return in 2 weeks at already scheduled appointment. Will check BMP at that time.

## 2021-06-24 NOTE — Progress Notes (Addendum)
Chronic Care Management Pharmacy Assistant   Name: Selena Bush  MRN: [id number] DOB: Mar 30, 1946  Reason for Encounter: CCM (Medication Adherence and Delivery Coordination)   Recent office visits:  06/22/2021 - [name], Patient presented for Annual Wellness Visit. Change: tiZANidine (ZANAFLEX) 4 MG tablet. Labs: A1c.   Recent consult visits:  06/10/2021 - Cardiology - Patient presented for congestive heart failure. Start: empagliflozin (JARDIANCE) 10 MG TABS tablet.  06/08/2021 - Cardiology - Patient presented for injection. No other information given.  06/08/2021 - Family Medicine - Patient presented for congestive heart failure. Stop due to patient not taking: bisacodyl (DULCOLAX) 5 MG EC tablet, metoCLOPramide (REGLAN) 5 MG tablet, omeprazole (PRILOSEC) 40 MG capsule and polyethylene glycol powder (GLYCOLAX/MIRALAX) 17 GM/SCOOP powder.  06/05/2021 - [facility] - Patient presented for shortness of breath. No medication changes.  05/31/2021 - Cardiology - Telephone - Patient called due to 10 lbs weight loss overnight. Change: Decrease furosemide (LASIX) 40 MG tablet to 20MG .  Hospital visits:  Medication Reconciliation was completed by comparing discharge summary, patient's EMR and Pharmacy list, and upon discussion with patient.  Admitted to the hospital on 06/05/2021 due to shortness of breath. Discharge date was 06/07/2021. Discharged from [facility] ED [facility].  Diagnoses: Acute on chronic diastolic congestive heart failure. "She is quite eager to go home and denies my offer for observation medical admission.  We will discharge with close return precautions and referral to our CHF clinic."  New?Medications Started at [facility] Discharge:?? None noted  Medication Changes at Hospital Discharge: None noted  Medications Discontinued at Hospital Discharge: None noted  Medications that remain the same after Hospital Discharge:??  -All  other medications will remain the same.    Medications: Outpatient Encounter Medications as of 06/24/2021  Medication Sig   acetaminophen (TYLENOL) 500 MG tablet Take by mouth.   albuterol (VENTOLIN HFA) 108 (90 Base) MCG/ACT inhaler Inhale 2 puffs into the lungs every 4 (four) hours as needed.   carbidopa-levodopa (SINEMET IR) 25-100 MG tablet Take by mouth. Take 2 tablets at 6 AM, 1 at 9AM, 1 tab at 12 PM,  2 at 3 PM, 1 at 6 PM and 1 at 9 PM.   diltiazem (CARDIZEM CD) 180 MG 24 hr capsule TAKE ONE TABLET BY MOUTH EVERY MORNING   DULoxetine (CYMBALTA) 60 MG capsule TAKE 1 CAPSULE BY MOUTH 2 TIMES A DAY for anxiety and depression.   empagliflozin (JARDIANCE) 10 MG TABS tablet Take 1 tablet (10 mg total) by mouth daily before breakfast.   ezetimibe (ZETIA) 10 MG tablet TAKE ONE TABLET BY MOUTH EVERY MORNING   fluticasone (FLONASE) 50 MCG/ACT nasal spray Place 2 sprays into both nostrils daily as needed for rhinitis or allergies.   furosemide (LASIX) 20 MG tablet Take 1 tablet (20 mg total) by mouth every morning.   gabapentin (NEURONTIN) 100 MG capsule TAKE 1 CAPSULE BY MOUTH THREE TIMES A DAY for anxiety.   gabapentin (NEURONTIN) 300 MG capsule Take 600 mg by mouth at bedtime.   hydrocortisone 2.5 % cream Apply to affected under breasts 1-2 times a day as directed and as needed for itch. Can also use on face as needed for itch   ketoconazole (NIZORAL) 2 % cream Apply to affected areas under breast twice daily for rash until healed. Can also use on face as needed for dermatitis   metoprolol tartrate (LOPRESSOR) 25 MG tablet TAKE THREE TABLETS BY MOUTH TWICE DAILY   metroNIDAZOLE (METROCREAM)  0.75 % cream Apply topically 2 (two) times daily. Apply to red areas of face   potassium chloride (MICRO-K) 10 MEQ CR capsule TAKE ONE CAPSULE BY MOUTH EVERY MORNING   sotalol (BETAPACE) 120 MG tablet TAKE ONE TABLET BY MOUTH TWICE DAILY   spironolactone (ALDACTONE) 25 MG tablet Take 1 tablet (25 mg total)  by mouth daily.   tiZANidine (ZANAFLEX) 4 MG tablet TAKE 2 TABLET (4 MG TOTAL) BY MOUTH DAILY FOR MUSCLE SPASMS AT BEDTIME.   No facility-administered encounter medications on file as of 06/24/2021.   BP Readings from Last 3 Encounters:  06/24/21 (!) 141/70  06/22/21 118/74  06/10/21 128/74    Lab Results  Component Value Date   HGBA1C 5.8 (A) 06/22/2021    Recent OV, Consult or Hospital visit:  Recent medication changes indicated:  Change: tiZANidine (ZANAFLEX) 4 MG tablet.  Last adherence delivery date: 06/07/2021  Patient is due for next adherence delivery on: 07/06/2021  Spoke with patient on 06/24/2021 reviewed medications and coordinated delivery.  This delivery to include: Adherence Packaging  30 Days  Packs: Diltiazem 180mg  1 tablet daily (breakfast) Metoprolol 25mg  3 tablets twice a day (3 breakfast and 3 evening meal) Sotalol 120mg  1 tablet twice daily (1 breakfast and 1 evening meal) Gabapentin 100mg  1 capsule three times a day (1 breakfast, 1 evening meal, 1 bedtime)  Tizanidine 4mg  2 tablets daily (2 bedtime) Spironolactone 25mg  tablet 1 tablet (breakfast) Gabapentin 300mg  2 tablets at bedtime (2 bedtime) Potassium chloride 47meq 1 tablet daily (breakfast) Ezetimibe 10mg  1 tablet daily (breakfast) Duloxetine 60mg  1 tablet twice a day (1 breakfast, 1 evening meal) Potassium Chloride 10 MEQ CR Capsule - 1 capsule every morning (1 breakfast)  VIAL medications: w/o safety caps Carbidopa-Levodopa 25-100mg  2 Tablets before breakfast 1 tablet at breakfast, 1 tablet at lunch, 1 tablet at evening meal and 2 tablets at bedtime. Fluticasone Propionate 50 mcg Place 2 sprays into both nostrils daily    Patient declined the following medications this month: Ketoconazole Shampoo 2%-  uses PRN Hydrocortisone 2.5% - uses PRN Furosemide 20 mg - 1 tablet daily - Filled at [facility] 06/07/2021 #90  Jardiance 10 mg    Any concerns about your medications? No  How often do you forget  or accidentally miss a dose? Never  Do you use a pillbox? No  Is patient in packaging Yes  If yes  What is the date on your next pill pack? 06/24/2021 - Dinner  Any concerns or issues with your packaging? No  Refills requested from providers include: Metoprolol 25mg  3 tablets twice a day (3 breakfast and 3 evening meal), Ezetimibe 10mg  1 tablet daily (breakfast) and Potassium Chloride 10 MEQ CR Capsule - 1 capsule every morning (1 breakfast).   Confirmed delivery date of 07/06/2021, advised patient that pharmacy will contact them the morning of delivery.   Recent blood pressure readings are as follows: Patient does not check at home.   Recent blood glucose readings are as follows: Patient does not check at home.   Annual wellness visit in last year? Yes 06/22/2021 Most Recent BP reading: 141/70 on 06/24/2021  If Diabetic: Most recent A1C reading: 5.8 on 06/22/2021 Last eye exam / retinopathy screening: No recent exam; scheduled for December Last diabetic foot exam: No recent exam  [name], CPP notified  [name], [place] Assistant [phone]  I have reviewed the care management and care coordination activities outlined in this encounter and I am certifying that I agree  with the content of this note. No further action required.  [name], PharmD Clinical Pharmacist [facility] [phone]

## 2021-06-24 NOTE — Patient Instructions (Addendum)
Continue weighing daily and call for an overnight weight gain of > 2 pounds or a weekly weight gain of >5 pounds.     Follow a 2000mg  sodium diet

## 2021-06-24 NOTE — Telephone Encounter (Signed)
Opened in error

## 2021-07-02 NOTE — Telephone Encounter (Signed)
Hey Amy, could you confirm with patient how much Jardiance she has on hand and will she need this in her next packs? Also, please confirm she is taking furosemide 20 mg every day.  Thank you,  [name], PharmD Clinical Pharmacist [facility] at [facility] [phone]

## 2021-07-05 NOTE — Progress Notes (Addendum)
Spoke with patient. She would like Jardiance 10 mg in her packs - we will have to wait until she sees cardiology 11/29 for a new prescription.. She is taking 1 a day before breakfast. Patient currently has 9 tablets remaining. Patient stated she is taking her Furosemide 20 mg 1 tablet a day. Patient has 52 remaining (next fill date should be September 05, 2021).   [name], CPP notified  Selena Bush, [place] Clinical Pharmacy Assistant [phone]  Time Spent:  11 Minutes

## 2021-07-07 DIAGNOSIS — I110 Hypertensive heart disease with heart failure: Secondary | ICD-10-CM | POA: Diagnosis not present

## 2021-07-07 DIAGNOSIS — M5136 Other intervertebral disc degeneration, lumbar region: Secondary | ICD-10-CM | POA: Diagnosis not present

## 2021-07-07 DIAGNOSIS — R69 Illness, unspecified: Secondary | ICD-10-CM | POA: Diagnosis not present

## 2021-07-07 DIAGNOSIS — I4891 Unspecified atrial fibrillation: Secondary | ICD-10-CM | POA: Diagnosis not present

## 2021-07-07 DIAGNOSIS — M109 Gout, unspecified: Secondary | ICD-10-CM | POA: Diagnosis not present

## 2021-07-07 DIAGNOSIS — G20 Parkinson's disease: Secondary | ICD-10-CM | POA: Diagnosis not present

## 2021-07-07 DIAGNOSIS — M1712 Unilateral primary osteoarthritis, left knee: Secondary | ICD-10-CM | POA: Diagnosis not present

## 2021-07-07 DIAGNOSIS — M5030 Other cervical disc degeneration, unspecified cervical region: Secondary | ICD-10-CM | POA: Diagnosis not present

## 2021-07-07 DIAGNOSIS — I509 Heart failure, unspecified: Secondary | ICD-10-CM | POA: Diagnosis not present

## 2021-07-12 NOTE — Progress Notes (Signed)
Patient ID: Selena Bush, female    DOB: 09-28-1945, 75 y.o.   MRN: [id number]  HPI  Selena Bush is a 75 y/o female with a history of breast cancer, hyperlipidemia, HNT, anxiety, atrial fibrillation, parkinson's disorder, DDD, depression, GERD, gout, obstructive sleep apnea and chronic heart failure.   Echo report from 04/29/21 reviewed and showed an EF of 65-70% along with mild LVH, moderately elevated PA pressure, severe LAE and possible PFO.  Was in the ED 06/05/21 due to cough, congestion and shortness of breath. Given IV lasix with improvement in symptoms and she was released.    She presents today for a follow-up visit with a chief complaint of minimal fatigue upon moderate exertion. She describes this as chronic in nature having been present for several years although has steadily improved over the last several months. She has associated easy bruising, right knee pain and chronic difficulty sleeping along with this. She denies any dizziness, abdominal distention, palpitations, pedal edema, chest pain, shortness of breath, cough or weight gain.   She says that she feels better since being started on jardiance.   Past Medical History:  Diagnosis Date   Anxiety    Atrial fibrillation (Selena Bush)    Cat bite of right hand 03/25/2019   Chronic diastolic CHF (congestive heart failure) (Selena Bush)    a. echo 09/2014: EF 25-95%, diastolic dysfunction, mild LVH, nl RV size & systolic function, mildly dilated LA (4.3 cm), mild MR/TR, mildly elevated PASP 36.7 mm Hg   DDD (degenerative disc disease), cervical    DDD (degenerative disc disease), lumbar    Depression    Diffuse cystic mastopathy 2014   Dyspnea    Dysrhythmia    GERD (gastroesophageal reflux disease)    Gout    Headache    rare   HLD (hyperlipidemia)    a. statin intolerant 2/2 myalgias   Hx of dysplastic nevus 12/25/2018   L medial ankle   Hypercholesterolemia    Hypertension    Malignant neoplasm of upper-outer quadrant of female  breast (Selena Bush) 10/2012   Papillary DCIS, sentinel node negative. DR/PR positive. PARTIAL RIGHT MASTECTOMY FOR BREAST CANCER--HAD RADIATION - NO CHEMO --DR. CRYSTAL [place] ONCOLOGIST   Obesity    OSA on CPAP    Osteoarthritis of both knees    a. s/p right TKA 04/2013 & left TKA 09/2014   Otitis externa    Parkinson disease [facility]ena [facility])    Personal history of radiation therapy 2015   RIGHT breast-mammosite per pt   Pneumonia of both lungs due to infectious organism 01/08/2021   Sleep difficulties    LUNESTA HAS HELPED   Vaginal cyst     Past Surgical History:  Procedure Laterality Date   ABDOMINAL HYSTERECTOMY  1992   DUB; fibroids; endometriosis.  One remaining ovary.     BREAST LUMPECTOMY Right 2015   Papillary DCIS, sentinel node negative. DR/PR positive. PARTIAL RIGHT MASTECTOMY FOR BREAST CANCER--HAD RADIATION - NO CHEMO --DR. CRYSTAL [facility] ONCOLOGIST   BREAST SURGERY Right 10/2012   Wide excision,APB RT 10 mm papillary DCIS, ER/PR positive. Sentinel node negative. Partial breast radiation.   CATARACT EXTRACTION, BILATERAL  02/13/2016   Selena Bush.   CHOLECYSTECTOMY  1994   COLONOSCOPY  2015   1 benign polyp-every 5 years/ Dr Selena Bush   COLONOSCOPY WITH PROPOFOL N/A 02/10/2021   Procedure: COLONOSCOPY WITH PROPOFOL;  Surgeon: Selena Bellow, MD;  Location: [facility] ENDOSCOPY;  Service: Endoscopy;  Laterality: N/A;   ERCP  1995  JOINT REPLACEMENT Right 04/2013   knee   PERICARDIOCENTESIS N/A 11/13/2017   Procedure: PERICARDIOCENTESIS;  Surgeon: Selena Bush, MD;  Location: [facility];  Service: Cardiovascular;  Laterality: N/A;   TOTAL KNEE ARTHROPLASTY Right 04/16/2013   Procedure: RIGHT TOTAL KNEE ARTHROPLASTY;  Surgeon: Selena Pole, MD;  Location: [facility] ORS;  Service: Orthopedics;  Laterality: Right;   TOTAL KNEE ARTHROPLASTY Left 09/15/2014   Procedure: LEFT TOTAL KNEE ARTHROPLASTY;  Surgeon: Selena Pole, MD;  Location: [facility];  Service: Orthopedics;  Laterality: Left;    TUBAL LIGATION  1979   UPPER GI ENDOSCOPY     Family History  Problem Relation Age of Onset   Colon cancer Mother    Cancer Mother 22       colon cancer   Melanoma Father    Cancer Father 59       melanoma   Colon cancer Maternal Grandfather    Breast cancer Maternal Grandfather    Melanoma Sister    Melanoma Sister    Social History   Tobacco Use   Smoking status: Never   Smokeless tobacco: Never   Tobacco comments:    social smoker as a teen  Substance Use Topics   Alcohol use: No   Allergies  Allergen Reactions   Penicillins Anaphylaxis    anaphylaxis  Has patient had a PCN reaction causing immediate rash, facial/tongue/throat swelling, SOB or lightheadedness with hypotension: Yes Has patient had a PCN reaction causing severe rash involving mucus membranes or skin necrosis: No Has patient had a PCN reaction that required hospitalization: No Has patient had a PCN reaction occurring within the last 10 years: No If all of the above answers are "NO", then may proceed with Cephalosporin use.    Sulfa Antibiotics Anaphylaxis   Codeine Other (See Comments) and Nausea And Vomiting    Gi problems    Statins Other (See Comments)    Leg pains   Aspirin Other (See Comments)    "burned my stomach intensely" Abdominal pain and burning   Prior to Admission medications   Medication Sig Start Date End Date Taking? Authorizing Provider  acetaminophen (TYLENOL) 500 MG tablet Take by mouth.   Yes [provider]  albuterol (VENTOLIN HFA) 108 (90 Base) MCG/ACT inhaler Inhale 2 puffs into the lungs every 4 (four) hours as needed. 12/07/20  Yes [provider]  carbidopa-levodopa (SINEMET IR) 25-100 MG tablet Take by mouth. Take 2 tablets at 6 AM, 1 at 9AM, 1 tab at 12 PM,  2 at 3 PM, 1 at 6 PM and 1 at 9 PM. 02/12/20  Yes [provider]  diltiazem (CARDIZEM CD) 180 MG 24 hr capsule TAKE ONE TABLET BY MOUTH EVERY MORNING 10/26/20  Yes Lelon Perla, MD   DULoxetine (CYMBALTA) 60 MG capsule TAKE 1 CAPSULE BY MOUTH 2 TIMES A DAY for anxiety and depression. 01/05/21  Yes Pleas Koch, NP  empagliflozin (JARDIANCE) 10 MG TABS tablet Take 1 tablet (10 mg total) by mouth daily before breakfast. 07/13/21  Yes Darylene Price A, FNP  ezetimibe (ZETIA) 10 MG tablet TAKE ONE TABLET BY MOUTH EVERY MORNING 06/23/21  Yes Lelon Perla, MD  fluticasone (FLONASE) 50 MCG/ACT nasal spray Place 2 sprays into both nostrils daily as needed for rhinitis or allergies. 10/06/20  Yes Pleas Koch, NP  furosemide (LASIX) 20 MG tablet Take 1 tablet (20 mg total) by mouth every morning. 06/07/21  Yes Lelon Perla, MD  gabapentin (NEURONTIN) 100 MG capsule TAKE 1 CAPSULE BY MOUTH THREE TIMES A DAY for anxiety. 11/13/19  Yes Pleas Koch, NP  gabapentin (NEURONTIN) 300 MG capsule Take 600 mg by mouth at bedtime. 01/04/21  Yes [provider]  hydrocortisone 2.5 % cream Apply to affected under breasts 1-2 times a day as directed and as needed for itch. Can also use on face as needed for itch 01/05/21  Yes Brendolyn Patty, MD  ketoconazole (NIZORAL) 2 % cream Apply to affected areas under breast twice daily for rash until healed. Can also use on face as needed for dermatitis 01/05/21  Yes Brendolyn Patty, MD  metoprolol tartrate (LOPRESSOR) 25 MG tablet TAKE THREE TABLETS BY MOUTH TWICE DAILY 06/23/21  Yes Lelon Perla, MD  metroNIDAZOLE (METROCREAM) 0.75 % cream Apply topically 2 (two) times daily. Apply to red areas of face 01/05/21 01/05/22 Yes Brendolyn Patty, MD  potassium chloride (MICRO-K) 10 MEQ CR capsule TAKE ONE CAPSULE BY MOUTH EVERY MORNING 06/23/21  Yes Loel Dubonnet, NP  sotalol (BETAPACE) 120 MG tablet TAKE ONE TABLET BY MOUTH TWICE DAILY 05/26/21  Yes Lelon Perla, MD  tiZANidine (ZANAFLEX) 4 MG tablet TAKE 2 TABLET (4 MG TOTAL) BY MOUTH DAILY FOR MUSCLE SPASMS AT BEDTIME. 06/22/21  Yes Pleas Koch, NP  empagliflozin  (JARDIANCE) 10 MG TABS tablet Take 1 tablet (10 mg total) by mouth daily before breakfast. 07/13/21   Alisa Graff, FNP  spironolactone (ALDACTONE) 25 MG tablet Take 1 tablet (25 mg total) by mouth daily. 05/21/21 08/19/21  Lendon Colonel, NP    Review of Systems  Constitutional:  Positive for fatigue ("improving"). Negative for appetite change.  HENT:  Negative for congestion, postnasal drip and sore throat.   Eyes: Negative.   Respiratory:  Negative for cough, chest tightness and shortness of breath.   Cardiovascular:  Negative for chest pain, palpitations and leg swelling.  Gastrointestinal:  Negative for abdominal distention and abdominal pain.  Endocrine: Negative.   Genitourinary: Negative.   Musculoskeletal:  Positive for arthralgias (right knee). Negative for back pain and neck pain.  Skin: Negative.   Allergic/Immunologic: Negative.   Neurological:  Negative for dizziness, light-headedness and headaches.  Hematological:  Negative for adenopathy. Bruises/bleeds easily.  Psychiatric/Behavioral:  Positive for sleep disturbance (sleeping in recliner). Negative for dysphoric mood.    Vitals:   07/13/21 1417  BP: 125/65  Pulse: (!) 55  Resp: 18  Weight: 172 lb 2 oz (78.1 kg)  Height: 5\' 2"  (1.575 m)   Wt Readings from Last 3 Encounters:  07/13/21 172 lb 2 oz (78.1 kg)  06/24/21 178 lb (80.7 kg)  06/22/21 177 lb (80.3 kg)   Bush Results  Component Value Date   CREATININE 1.50 (H) 06/10/2021   CREATININE 1.39 (H) 06/08/2021   CREATININE 1.25 (H) 06/05/2021    Physical Exam Vitals and nursing note reviewed. Exam conducted with a chaperone present (caregiver-[name]).  Constitutional:      Appearance: Normal appearance.  HENT:     Head: Normocephalic and atraumatic.  Cardiovascular:     Rate and Rhythm: Regular rhythm. Bradycardia present.  Pulmonary:     Effort: Pulmonary effort is normal. No respiratory distress.     Breath sounds: No wheezing or rales.   Abdominal:     General: There is no distension.     Palpations: Abdomen is soft.  Musculoskeletal:        General: No tenderness.     Cervical back:  Normal range of motion and neck supple.     Right lower leg: No edema.     Left lower leg: Edema (trace pitting around ankle) present.  Skin:    General: Skin is warm and dry.     Findings: Bruising (tops of both hands) present.  Neurological:     Mental Status: She is alert and oriented to person, place, and time. Mental status is at baseline.  Psychiatric:        Mood and Affect: Mood normal.        Behavior: Behavior normal.        Thought Content: Thought content normal.    Assessment & Plan:  1: Chronic heart failure with preserved ejection fraction with structural changes (LVH/LAE)- - NYHA class II - euvolemic today - weighing daily; reminded to call for an overnight weight gain of > 2 pounds or a weekly weight gain of > 5 pounds - weight down 6 pounds from last visit here 2 weeks ago  - saw cardiology Gilford Rile) 04/08/21 in [place] - not adding salt to her food; has a live in caregiver and they try and look at food labels  - on GDMT of SGLT2; will need to watch renal function & add entresto in the future if able - BNP 06/08/21 was 122.8  2: HTN- - BP looks good today - saw PCP Carlis Abbott) 06/22/21 - BMP 06/10/21 reviewed and showed sodium 135, potassium 5.4, creatinine 1.5 & GFR 37 - check BMP today  3: Atrial fibrillation- - saw cardiology Purcell Nails) 05/21/21 in [place]  4: Sleep apnea- - normally wears CPAP but hadn't been wearing it due to coughing - now feels like she's gotten out of the habit of wearing it and admits that she needs to try and start wearing it again   Medication list reviewed.   Return in 2 months or sooner for any questions/problems before then.

## 2021-07-13 ENCOUNTER — Other Ambulatory Visit: Payer: Self-pay

## 2021-07-13 ENCOUNTER — Encounter: Payer: Self-pay | Admitting: Family

## 2021-07-13 ENCOUNTER — Ambulatory Visit (HOSPITAL_BASED_OUTPATIENT_CLINIC_OR_DEPARTMENT_OTHER): Payer: Medicare HMO | Admitting: Family

## 2021-07-13 ENCOUNTER — Other Ambulatory Visit
Admission: RE | Admit: 2021-07-13 | Discharge: 2021-07-13 | Disposition: A | Discharge status: Home / Self Care | Payer: Medicare HMO | Source: Ambulatory Visit | Attending: Family | Admitting: Family

## 2021-07-13 VITALS — BP 125/65 | HR 55 | Resp 18 | Ht 62.0 in | Wt 172.1 lb

## 2021-07-13 DIAGNOSIS — I5032 Chronic diastolic (congestive) heart failure: Secondary | ICD-10-CM | POA: Diagnosis not present

## 2021-07-13 DIAGNOSIS — Z87891 Personal history of nicotine dependence: Secondary | ICD-10-CM | POA: Insufficient documentation

## 2021-07-13 DIAGNOSIS — G20 Parkinson's disease: Secondary | ICD-10-CM | POA: Insufficient documentation

## 2021-07-13 DIAGNOSIS — G4733 Obstructive sleep apnea (adult) (pediatric): Secondary | ICD-10-CM | POA: Insufficient documentation

## 2021-07-13 DIAGNOSIS — Z853 Personal history of malignant neoplasm of breast: Secondary | ICD-10-CM | POA: Insufficient documentation

## 2021-07-13 DIAGNOSIS — I4891 Unspecified atrial fibrillation: Secondary | ICD-10-CM | POA: Insufficient documentation

## 2021-07-13 DIAGNOSIS — I10 Essential (primary) hypertension: Secondary | ICD-10-CM | POA: Diagnosis not present

## 2021-07-13 DIAGNOSIS — I110 Hypertensive heart disease with heart failure: Secondary | ICD-10-CM | POA: Insufficient documentation

## 2021-07-13 DIAGNOSIS — I480 Paroxysmal atrial fibrillation: Secondary | ICD-10-CM | POA: Diagnosis not present

## 2021-07-13 LAB — BASIC METABOLIC PANEL
Anion gap: 8 (ref 5–15)
BUN: 37 mg/dL — ABNORMAL HIGH (ref 8–23)
CO2: 30 mmol/L (ref 22–32)
Calcium: 9.8 mg/dL (ref 8.9–10.3)
Chloride: 95 mmol/L — ABNORMAL LOW (ref 98–111)
Creatinine, Ser: 1.27 mg/dL — ABNORMAL HIGH (ref 0.44–1.00)
GFR, Estimated: 45 mL/min — ABNORMAL LOW (ref >60)
Glucose, Bld: 100 mg/dL — ABNORMAL HIGH (ref 70–99)
Potassium: 4.7 mmol/L (ref 3.5–5.1)
Sodium: 133 mmol/L — ABNORMAL LOW (ref 135–145)

## 2021-07-13 MED ORDER — EMPAGLIFLOZIN 10 MG PO TABS
10.0000 mg | ORAL_TABLET | Freq: Every day | ORAL | 5 refills | Status: DC
Start: 2021-07-13 — End: 2021-12-20

## 2021-07-13 MED ORDER — EMPAGLIFLOZIN 10 MG PO TABS: 10.0000 mg | ORAL_TABLET | Freq: Every day | ORAL | 0 refills | Status: DC

## 2021-07-13 NOTE — Patient Instructions (Signed)
Continue weighing daily and call for an overnight weight gain of > 2 pounds or a weekly weight gain of >5 pounds. 

## 2021-07-14 ENCOUNTER — Telehealth: Payer: Self-pay

## 2021-07-14 NOTE — Telephone Encounter (Addendum)
Informed patient of lab results and below message. Patient acknowledged. Patient instructed to call if any questions or concerns arise.  Georg Ruddle, RN [facility]  ----- Message from Alisa Graff, [place] sent at 07/14/2021  9:49 AM EST ----- Potassium level is normal and kidney function has improved. Continue all medications at this time.

## 2021-07-16 DIAGNOSIS — M5136 Other intervertebral disc degeneration, lumbar region: Secondary | ICD-10-CM | POA: Diagnosis not present

## 2021-07-16 DIAGNOSIS — I110 Hypertensive heart disease with heart failure: Secondary | ICD-10-CM | POA: Diagnosis not present

## 2021-07-16 DIAGNOSIS — M1712 Unilateral primary osteoarthritis, left knee: Secondary | ICD-10-CM | POA: Diagnosis not present

## 2021-07-16 DIAGNOSIS — I4891 Unspecified atrial fibrillation: Secondary | ICD-10-CM | POA: Diagnosis not present

## 2021-07-16 DIAGNOSIS — I509 Heart failure, unspecified: Secondary | ICD-10-CM | POA: Diagnosis not present

## 2021-07-16 DIAGNOSIS — G20 Parkinson's disease: Secondary | ICD-10-CM | POA: Diagnosis not present

## 2021-07-16 DIAGNOSIS — M5030 Other cervical disc degeneration, unspecified cervical region: Secondary | ICD-10-CM | POA: Diagnosis not present

## 2021-07-16 DIAGNOSIS — M109 Gout, unspecified: Secondary | ICD-10-CM | POA: Diagnosis not present

## 2021-07-16 DIAGNOSIS — R69 Illness, unspecified: Secondary | ICD-10-CM | POA: Diagnosis not present

## 2021-07-19 NOTE — Progress Notes (Signed)
HPI: Follow-up diastolic congestive heart failure, paroxysmal atrial fibrillation, pericardial effusion, hypertension and hyperlipidemia. Patient was admitted March 2019 with atypical chest pain and paroxysmal atrial fibrillation. She was treated with sotalol and Xarelto. However she returned with significant dyspnea and follow-up echocardiogram showed large pericardial effusion with tamponade physiology.  She had pericardiocentesis on April 1. Cytology was negative.  At that time we felt it was likely her initial presentation was pericarditis and she had a hemorrhagic pericardial effusion from initiation of anticoagulation.  We felt her atrial fibrillation may improve once pericardial process resolved. Patient was admitted with pneumonia and recurrent atrial fibrilllation to an outside hospital. Last echocardiogram September 2022 showed normal LV function, mild left ventricular hypertrophy, grade 2 diastolic dysfunction, moderate pulmonary hypertension, severe left atrial enlargement, cannot rule out PFO, mild aortic stenosis with mean gradient 10 mmHg.  Since last seen she denies dyspnea, chest pain, palpitations or syncope.  She did have worsening lower extremity edema that improved with additional Lasix.  Current Outpatient Medications  Medication Sig Dispense Refill   acetaminophen (TYLENOL) 500 MG tablet Take by mouth.     albuterol (VENTOLIN HFA) 108 (90 Base) MCG/ACT inhaler Inhale 2 puffs into the lungs every 4 (four) hours as needed.     carbidopa-levodopa (SINEMET IR) 25-100 MG tablet Take by mouth. Take 2 tablets at 6 AM, 1 at 9AM, 1 tab at 12 PM,  2 at 3 PM, 1 at 6 PM and 1 at 9 PM.     diltiazem (CARDIZEM CD) 180 MG 24 hr capsule TAKE ONE CAPSULE BY MOUTH EVERY MORNING 90 capsule 2   DULoxetine (CYMBALTA) 60 MG capsule TAKE ONE CAPSULE BY MOUTH TWICE DAILY FOR anxiety AND depression 180 capsule 2   empagliflozin (JARDIANCE) 10 MG TABS tablet Take 1 tablet (10 mg total) by mouth daily  before breakfast. 30 tablet 5   empagliflozin (JARDIANCE) 10 MG TABS tablet Take 1 tablet (10 mg total) by mouth daily before breakfast. 30 tablet 0   ezetimibe (ZETIA) 10 MG tablet TAKE ONE TABLET BY MOUTH EVERY MORNING 90 tablet 3   fluticasone (FLONASE) 50 MCG/ACT nasal spray Place 2 sprays into both nostrils daily as needed for rhinitis or allergies. 48 g 3   furosemide (LASIX) 20 MG tablet Take 1 tablet (20 mg total) by mouth every morning. 90 tablet 3   gabapentin (NEURONTIN) 100 MG capsule TAKE 1 CAPSULE BY MOUTH THREE TIMES A DAY for anxiety. 270 capsule 1   gabapentin (NEURONTIN) 300 MG capsule Take 600 mg by mouth at bedtime.     hydrocortisone 2.5 % cream Apply to affected under breasts 1-2 times a day as directed and as needed for itch. Can also use on face as needed for itch 28 g 3   ketoconazole (NIZORAL) 2 % cream Apply to affected areas under breast twice daily for rash until healed. Can also use on face as needed for dermatitis 30 g 5   metoprolol tartrate (LOPRESSOR) 25 MG tablet TAKE THREE TABLETS BY MOUTH TWICE DAILY 235 tablet 3   metroNIDAZOLE (METROCREAM) 0.75 % cream Apply topically 2 (two) times daily. Apply to red areas of face 45 g 3   potassium chloride (MICRO-K) 10 MEQ CR capsule TAKE ONE CAPSULE BY MOUTH EVERY MORNING 90 capsule 0   sotalol (BETAPACE) 120 MG tablet TAKE ONE TABLET BY MOUTH TWICE DAILY 180 tablet 3   spironolactone (ALDACTONE) 25 MG tablet Take 1 tablet (25 mg total) by mouth  daily. 90 tablet 2   tiZANidine (ZANAFLEX) 4 MG tablet TAKE 2 TABLET (4 MG TOTAL) BY MOUTH DAILY FOR MUSCLE SPASMS AT BEDTIME. 180 tablet 3   No current facility-administered medications for this visit.     Past Medical History:  Diagnosis Date   Anxiety    Atrial fibrillation ([place])    Cat bite of right hand 03/25/2019   Chronic diastolic CHF (congestive heart failure) ([place])    a. echo 09/2014: EF 53-61%, diastolic dysfunction, mild LVH, nl RV size & systolic function, mildly  dilated LA (4.3 cm), mild MR/TR, mildly elevated PASP 36.7 mm Hg   DDD (degenerative disc disease), cervical    DDD (degenerative disc disease), lumbar    Depression    Diffuse cystic mastopathy 2014   Dyspnea    Dysrhythmia    GERD (gastroesophageal reflux disease)    Gout    Headache    rare   HLD (hyperlipidemia)    a. statin intolerant 2/2 myalgias   Hx of dysplastic nevus 12/25/2018   L medial ankle   Hypercholesterolemia    Hypertension    Malignant neoplasm of upper-outer quadrant of female breast ([place]) 10/2012   Papillary DCIS, sentinel node negative. DR/PR positive. PARTIAL RIGHT MASTECTOMY FOR BREAST CANCER--HAD RADIATION - NO CHEMO --DR. CRYSTAL [place] ONCOLOGIST   Obesity    OSA on CPAP    Osteoarthritis of both knees    a. s/p right TKA 04/2013 & left TKA 09/2014   Otitis externa    Parkinson disease [facility])    Personal history of radiation therapy 2015   RIGHT breast-mammosite per pt   Pneumonia of both lungs due to infectious organism 01/08/2021   Sleep difficulties    LUNESTA HAS HELPED   Vaginal cyst     Past Surgical History:  Procedure Laterality Date   ABDOMINAL HYSTERECTOMY  1992   DUB; fibroids; endometriosis.  One remaining ovary.     BREAST LUMPECTOMY Right 2015   Papillary DCIS, sentinel node negative. DR/PR positive. PARTIAL RIGHT MASTECTOMY FOR BREAST CANCER--HAD RADIATION - NO CHEMO --DR. CRYSTAL [place] ONCOLOGIST   BREAST SURGERY Right 10/2012   Wide excision,APB RT 10 mm papillary DCIS, ER/PR positive. Sentinel node negative. Partial breast radiation.   CATARACT EXTRACTION, BILATERAL  02/13/2016   [place].   CHOLECYSTECTOMY  1994   COLONOSCOPY  2015   1 benign polyp-every 5 years/ Dr Candace Cruise   COLONOSCOPY WITH PROPOFOL N/A 02/10/2021   Procedure: COLONOSCOPY WITH PROPOFOL;  Surgeon: Robert Bellow, MD;  Location: [facility] ENDOSCOPY;  Service: Endoscopy;  Laterality: N/A;   ERCP  1995   JOINT REPLACEMENT Right 04/2013   knee    PERICARDIOCENTESIS N/A 11/13/2017   Procedure: PERICARDIOCENTESIS;  Surgeon: Nelva Bush, MD;  Location: [place] CV LAB;  Service: Cardiovascular;  Laterality: N/A;   TOTAL KNEE ARTHROPLASTY Right 04/16/2013   Procedure: RIGHT TOTAL KNEE ARTHROPLASTY;  Surgeon: Mauri Pole, MD;  Location: [facility];  Service: Orthopedics;  Laterality: Right;   TOTAL KNEE ARTHROPLASTY Left 09/15/2014   Procedure: LEFT TOTAL KNEE ARTHROPLASTY;  Surgeon: Mauri Pole, MD;  Location: [facility];  Service: Orthopedics;  Laterality: Left;   TUBAL LIGATION  1979   UPPER GI ENDOSCOPY      Social History   Socioeconomic History   Marital status: Widowed    Spouse name: Chrissie Noa   Number of children: 2   Years of education: College   Highest education level: Bachelor's degree (e.g., BA, AB, BS)  Occupational  History   Occupation: Retired  Tobacco Use   Smoking status: Never   Smokeless tobacco: Never   Tobacco comments:    social smoker as a teen  Scientific laboratory technician Use: Never used  Substance and Sexual Activity   Alcohol use: No   Drug use: No   Sexual activity: Not Currently    Birth control/protection: Post-menopausal, Surgical  Other Topics Concern   Not on file  Social History Narrative   Marital status: widowed since 09/16/2015      Children: 2 children (35, 65); 5 grandchild (4 in [place]; 1 in [place]).      Lives: alone in townhome; 1 dog, 2 cats      Employment: psychiatric Education officer, museum; retired in 2015      Tobacco: teenager only      Alcohol:  None      Drugs: none      Exercise: walking in 2019; more active in 2019.  Walking daily small amounts.        ADLs: drives; independent with ADLs; no assistant devices      Advanced Directives: none; FULL CODE; no prolonged measures.   Does not need them.  Daughter/Heather [place] Deck is HCPOA.    Social Determinants of Health   Financial Resource Strain: High Risk   Difficulty of Paying Living Expenses: Hard  Food Insecurity: Not on file   Transportation Needs: Not on file  Physical Activity: Not on file  Stress: Not on file  Social Connections: Not on file  Intimate Partner Violence: Not on file    Family History  Problem Relation Age of Onset   Colon cancer Mother    Cancer Mother 11       colon cancer   Melanoma Father    Cancer Father 17       melanoma   Colon cancer Maternal Grandfather    Breast cancer Maternal Grandfather    Melanoma Sister    Melanoma Sister     ROS: no fevers or chills, productive cough, hemoptysis, dysphasia, odynophagia, melena, hematochezia, dysuria, hematuria, rash, seizure activity, orthopnea, PND, pedal edema, claudication. Remaining systems are negative.  Physical Exam: Well-developed well-nourished in no acute distress.  Skin is warm and dry.  HEENT is normal.  Neck is supple.  Chest is clear to auscultation with normal expansion.  Cardiovascular exam is regular rate and rhythm.  2/6 systolic murmur left sternal border.  S2 is not diminished. Abdominal exam nontender or distended. No masses palpated. Extremities show no edema. neuro grossly intact  A/P  1 paroxysmal atrial fibrillation-she is in sinus rhythm on exam today.  Previous atrial fibrillation was felt secondary to pericarditis/pneumonia.  She was placed on Xarelto at that time but developed a hemorrhagic pericardial effusion and we have therefore not resumed.  Continue sotalol.  We will continue Cardizem and beta-blocker for rate control if atrial fibrillation recurs.  2 chronic diastolic congestive heart failure-she is euvolemic on examination.  Continue present dose of diuretic.    3 history of hemorrhagic pericardial effusion-no effusion on most recent echocardiogram.  4 hypertension-blood pressure controlled.  Continue present medical regimen.  5 hyperlipidemia-continue Zetia.  She is intolerant to statins.  [name], MD

## 2021-07-20 DIAGNOSIS — I509 Heart failure, unspecified: Secondary | ICD-10-CM | POA: Diagnosis not present

## 2021-07-20 DIAGNOSIS — I110 Hypertensive heart disease with heart failure: Secondary | ICD-10-CM | POA: Diagnosis not present

## 2021-07-20 DIAGNOSIS — I4891 Unspecified atrial fibrillation: Secondary | ICD-10-CM | POA: Diagnosis not present

## 2021-07-20 DIAGNOSIS — R69 Illness, unspecified: Secondary | ICD-10-CM | POA: Diagnosis not present

## 2021-07-20 DIAGNOSIS — M5030 Other cervical disc degeneration, unspecified cervical region: Secondary | ICD-10-CM | POA: Diagnosis not present

## 2021-07-20 DIAGNOSIS — M109 Gout, unspecified: Secondary | ICD-10-CM | POA: Diagnosis not present

## 2021-07-20 DIAGNOSIS — M5136 Other intervertebral disc degeneration, lumbar region: Secondary | ICD-10-CM | POA: Diagnosis not present

## 2021-07-20 DIAGNOSIS — G20 Parkinson's disease: Secondary | ICD-10-CM | POA: Diagnosis not present

## 2021-07-20 DIAGNOSIS — M1712 Unilateral primary osteoarthritis, left knee: Secondary | ICD-10-CM | POA: Diagnosis not present

## 2021-07-21 ENCOUNTER — Other Ambulatory Visit: Payer: Self-pay | Admitting: Primary Care

## 2021-07-21 ENCOUNTER — Other Ambulatory Visit: Payer: Self-pay | Admitting: Cardiology

## 2021-07-21 DIAGNOSIS — F330 Major depressive disorder, recurrent, mild: Secondary | ICD-10-CM | POA: Diagnosis not present

## 2021-07-21 DIAGNOSIS — J309 Allergic rhinitis, unspecified: Secondary | ICD-10-CM

## 2021-07-21 DIAGNOSIS — F324 Major depressive disorder, single episode, in partial remission: Secondary | ICD-10-CM

## 2021-07-21 DIAGNOSIS — R69 Illness, unspecified: Secondary | ICD-10-CM | POA: Diagnosis not present

## 2021-07-21 DIAGNOSIS — F419 Anxiety disorder, unspecified: Secondary | ICD-10-CM

## 2021-07-21 DIAGNOSIS — F4312 Post-traumatic stress disorder, chronic: Secondary | ICD-10-CM | POA: Diagnosis not present

## 2021-07-22 ENCOUNTER — Telehealth: Payer: Self-pay

## 2021-07-22 NOTE — Progress Notes (Addendum)
Chronic Care Management Pharmacy Assistant   Name: Selena Bush  MRN: [id number] DOB: Oct 09, 1945  Reason for Encounter: CCM (Medication Adherence and Delivery Coordination)   Recent office visits:  None since last CCM contact  Recent consult visits:  07/20/2021 - Neurology - Telephone - Patient states Physical therapy is through [facility]. No other information.  07/13/2021 - Cardiology - Patient presented for congestive heart failure. Labs: BMP.  06/24/2021 - Neurology - Patient presented for difficulty walking and Parkinson's disease. Continue Gabapentin 100 mg three times a day, and 600mg  at night. Can take additional 100mg  as needed for RLS.  Hospital visits:  None since last CCM contact  Medications: Outpatient Encounter Medications as of 07/22/2021  Medication Sig   acetaminophen (TYLENOL) 500 MG tablet Take by mouth.   albuterol (VENTOLIN HFA) 108 (90 Base) MCG/ACT inhaler Inhale 2 puffs into the lungs every 4 (four) hours as needed.   carbidopa-levodopa (SINEMET IR) 25-100 MG tablet Take by mouth. Take 2 tablets at 6 AM, 1 at 9AM, 1 tab at 12 PM,  2 at 3 PM, 1 at 6 PM and 1 at 9 PM.   diltiazem (CARDIZEM CD) 180 MG 24 hr capsule TAKE ONE CAPSULE BY MOUTH EVERY MORNING   DULoxetine (CYMBALTA) 60 MG capsule TAKE ONE CAPSULE BY MOUTH TWICE DAILY FOR anxiety AND depression   empagliflozin (JARDIANCE) 10 MG TABS tablet Take 1 tablet (10 mg total) by mouth daily before breakfast.   empagliflozin (JARDIANCE) 10 MG TABS tablet Take 1 tablet (10 mg total) by mouth daily before breakfast.   ezetimibe (ZETIA) 10 MG tablet TAKE ONE TABLET BY MOUTH EVERY MORNING   fluticasone (FLONASE) 50 MCG/ACT nasal spray Place 2 sprays into both nostrils daily as needed for rhinitis or allergies.   furosemide (LASIX) 20 MG tablet Take 1 tablet (20 mg total) by mouth every morning.   gabapentin (NEURONTIN) 100 MG capsule TAKE 1 CAPSULE BY MOUTH THREE TIMES A DAY for anxiety.    gabapentin (NEURONTIN) 300 MG capsule Take 600 mg by mouth at bedtime.   hydrocortisone 2.5 % cream Apply to affected under breasts 1-2 times a day as directed and as needed for itch. Can also use on face as needed for itch   ketoconazole (NIZORAL) 2 % cream Apply to affected areas under breast twice daily for rash until healed. Can also use on face as needed for dermatitis   metoprolol tartrate (LOPRESSOR) 25 MG tablet TAKE THREE TABLETS BY MOUTH TWICE DAILY   metroNIDAZOLE (METROCREAM) 0.75 % cream Apply topically 2 (two) times daily. Apply to red areas of face   potassium chloride (MICRO-K) 10 MEQ CR capsule TAKE ONE CAPSULE BY MOUTH EVERY MORNING   sotalol (BETAPACE) 120 MG tablet TAKE ONE TABLET BY MOUTH TWICE DAILY   spironolactone (ALDACTONE) 25 MG tablet Take 1 tablet (25 mg total) by mouth daily.   tiZANidine (ZANAFLEX) 4 MG tablet TAKE 2 TABLET (4 MG TOTAL) BY MOUTH DAILY FOR MUSCLE SPASMS AT BEDTIME.   No facility-administered encounter medications on file as of 07/22/2021.   BP Readings from Last 3 Encounters:  07/13/21 125/65  06/24/21 (!) 141/70  06/22/21 118/74    Lab Results  Component Value Date   HGBA1C 5.8 (A) 06/22/2021    Recent OV, Consult or Hospital visit:  Recent medication changes indicated: Continue Gabapentin 100 mg three times a day, and 600mg  at night. Can take additional 100mg  as needed for RLS.  Last adherence delivery date:  07/06/2021  Patient is due for next adherence delivery on: 08/05/2021  Spoke with patient on 07/22/2021 reviewed medications and coordinated delivery.  This delivery to include: Adherence Packaging  30 Days  Packs: Jardiance 10 mg 1 tablet daily (before breakfast) Potassium Chloride 10 MEQ CR Capsule - 1 capsule every morning (1 breakfast) Ezetimibe 10mg  1 tablet daily (breakfast) Diltiazem 180mg  1 tablet daily (breakfast) Metoprolol 25mg  3 tablets twice a day (3 breakfast and 3 evening meal) Sotalol 120mg  1 tablet twice daily  (1 breakfast and 1 evening meal) Gabapentin 300mg  2 tablets at bedtime (2 bedtime) Tizanidine 4mg  2 tablets daily (2 bedtime) Spironolactone 25mg  tablet 1 tablet (breakfast) Duloxetine 60 mg 1 tablet twice a day (1 breakfast, 1 evening meal)   VIAL medications: w/o safety caps Carbidopa-Levodopa 25-100mg  2 Tablets before breakfast 1 tablet at breakfast, 1 tablet at lunch, 1 tablet at evening meal and 2 tablets at bedtime.May take extra tab as need.  Gabapentin 100mg  1 capsule three times a day (1 breakfast, 1 evening meal, 1 bedtime) May take 1 extra daily, PRN in vial.   Patient declined the following medications this month: Ketoconazole Shampoo 2%-  uses PRN Hydrocortisone 2.5% - uses PRN Furosemide 20 mg - 1 tablet daily - [facility]  Fluticasone Propionate 50 mcg Place 2 sprays into both nostrils daily   Any concerns about your medications? No  How often do you forget or accidentally miss a dose? Never  Do you use a pillbox? No  Is patient in packaging Yes  If yes  What is the date on your next pill pack?  07/22/2021 evening meal  Refills requested from providers include: Diltiazem 180mg  1 tablet daily (breakfast) Fluticasone Propionate 50 mcg Place 2 sprays into both nostrils daily   Confirmed delivery date of 08/05/2021, advised patient that pharmacy will contact them the morning of delivery.   Recent blood pressure readings are as follows:  Patient does not check at home.   Recent blood glucose readings are as follows:  Patient does not check at home.   Annual wellness visit in last year? Yes 06/22/2021 Most Recent BP reading: 125/65 on 07/13/2021  [name], CPP notified  [name], [place] Assistant [phone]  I have reviewed the care management and care coordination activities outlined in this encounter and I am certifying that I agree with the content of this note. No further action required.  [name],  PharmD Clinical Pharmacist [facility] at [facility] [phone]

## 2021-07-26 ENCOUNTER — Other Ambulatory Visit: Payer: Self-pay

## 2021-07-26 ENCOUNTER — Ambulatory Visit: Payer: Medicare HMO | Admitting: Cardiology

## 2021-07-26 ENCOUNTER — Encounter: Payer: Self-pay | Admitting: Cardiology

## 2021-07-26 VITALS — BP 134/62 | HR 56 | Ht 62.0 in | Wt 169.8 lb

## 2021-07-26 DIAGNOSIS — I10 Essential (primary) hypertension: Secondary | ICD-10-CM

## 2021-07-26 DIAGNOSIS — I480 Paroxysmal atrial fibrillation: Secondary | ICD-10-CM

## 2021-07-26 DIAGNOSIS — I5032 Chronic diastolic (congestive) heart failure: Secondary | ICD-10-CM

## 2021-07-26 NOTE — Patient Instructions (Signed)

## 2021-07-27 DIAGNOSIS — Z7984 Long term (current) use of oral hypoglycemic drugs: Secondary | ICD-10-CM | POA: Diagnosis not present

## 2021-07-27 DIAGNOSIS — Z0100 Encounter for examination of eyes and vision without abnormal findings: Secondary | ICD-10-CM | POA: Diagnosis not present

## 2021-07-27 DIAGNOSIS — E119 Type 2 diabetes mellitus without complications: Secondary | ICD-10-CM | POA: Diagnosis not present

## 2021-07-27 DIAGNOSIS — H5212 Myopia, left eye: Secondary | ICD-10-CM | POA: Diagnosis not present

## 2021-07-27 DIAGNOSIS — H524 Presbyopia: Secondary | ICD-10-CM | POA: Diagnosis not present

## 2021-07-27 DIAGNOSIS — H5201 Hypermetropia, right eye: Secondary | ICD-10-CM | POA: Diagnosis not present

## 2021-07-27 DIAGNOSIS — H50112 Monocular exotropia, left eye: Secondary | ICD-10-CM | POA: Diagnosis not present

## 2021-07-27 LAB — HM DIABETES EYE EXAM

## 2021-07-28 DIAGNOSIS — I110 Hypertensive heart disease with heart failure: Secondary | ICD-10-CM | POA: Diagnosis not present

## 2021-07-28 DIAGNOSIS — G20 Parkinson's disease: Secondary | ICD-10-CM | POA: Diagnosis not present

## 2021-07-28 DIAGNOSIS — R69 Illness, unspecified: Secondary | ICD-10-CM | POA: Diagnosis not present

## 2021-07-28 DIAGNOSIS — M5030 Other cervical disc degeneration, unspecified cervical region: Secondary | ICD-10-CM | POA: Diagnosis not present

## 2021-07-28 DIAGNOSIS — M5136 Other intervertebral disc degeneration, lumbar region: Secondary | ICD-10-CM | POA: Diagnosis not present

## 2021-07-28 DIAGNOSIS — M1712 Unilateral primary osteoarthritis, left knee: Secondary | ICD-10-CM | POA: Diagnosis not present

## 2021-07-28 DIAGNOSIS — I509 Heart failure, unspecified: Secondary | ICD-10-CM | POA: Diagnosis not present

## 2021-07-28 DIAGNOSIS — M109 Gout, unspecified: Secondary | ICD-10-CM | POA: Diagnosis not present

## 2021-07-28 DIAGNOSIS — I4891 Unspecified atrial fibrillation: Secondary | ICD-10-CM | POA: Diagnosis not present

## 2021-08-03 DIAGNOSIS — G20 Parkinson's disease: Secondary | ICD-10-CM | POA: Diagnosis not present

## 2021-08-03 DIAGNOSIS — I4891 Unspecified atrial fibrillation: Secondary | ICD-10-CM | POA: Diagnosis not present

## 2021-08-03 DIAGNOSIS — M109 Gout, unspecified: Secondary | ICD-10-CM | POA: Diagnosis not present

## 2021-08-03 DIAGNOSIS — R69 Illness, unspecified: Secondary | ICD-10-CM | POA: Diagnosis not present

## 2021-08-03 DIAGNOSIS — M5136 Other intervertebral disc degeneration, lumbar region: Secondary | ICD-10-CM | POA: Diagnosis not present

## 2021-08-03 DIAGNOSIS — I110 Hypertensive heart disease with heart failure: Secondary | ICD-10-CM | POA: Diagnosis not present

## 2021-08-03 DIAGNOSIS — M1712 Unilateral primary osteoarthritis, left knee: Secondary | ICD-10-CM | POA: Diagnosis not present

## 2021-08-03 DIAGNOSIS — M5030 Other cervical disc degeneration, unspecified cervical region: Secondary | ICD-10-CM | POA: Diagnosis not present

## 2021-08-03 DIAGNOSIS — I509 Heart failure, unspecified: Secondary | ICD-10-CM | POA: Diagnosis not present

## 2021-08-05 ENCOUNTER — Encounter: Payer: Self-pay | Admitting: Primary Care

## 2021-08-25 ENCOUNTER — Telehealth: Payer: Self-pay

## 2021-08-25 NOTE — Progress Notes (Addendum)
Chronic Care Management Pharmacy Assistant   Name: Selena Bush  MRN: [id number] DOB: [birth year]  Reason for Encounter: CCM (Medication Adherence and Delivery Coordination)   Recent office visits:  None since last CCM contact  Recent consult visits:  07/26/2021 - [name], MD - Cardiology - Patient presented for congestive heart failure. Continue current medications.   Hospital visits:  None since last CCM contact  Medications: Outpatient Encounter Medications as of 08/25/2021  Medication Sig   acetaminophen (TYLENOL) 500 MG tablet Take by mouth.   albuterol (VENTOLIN HFA) 108 (90 Base) MCG/ACT inhaler Inhale 2 puffs into the lungs every 4 (four) hours as needed.   carbidopa-levodopa (SINEMET IR) 25-100 MG tablet Take by mouth. Take 2 tablets at 6 AM, 1 at 9AM, 1 tab at 12 PM,  2 at 3 PM, 1 at 6 PM and 1 at 9 PM.   diltiazem (CARDIZEM CD) 180 MG 24 hr capsule TAKE ONE CAPSULE BY MOUTH EVERY MORNING   DULoxetine (CYMBALTA) 60 MG capsule TAKE ONE CAPSULE BY MOUTH TWICE DAILY FOR anxiety AND depression   empagliflozin (JARDIANCE) 10 MG TABS tablet Take 1 tablet (10 mg total) by mouth daily before breakfast.   empagliflozin (JARDIANCE) 10 MG TABS tablet Take 1 tablet (10 mg total) by mouth daily before breakfast.   ezetimibe (ZETIA) 10 MG tablet TAKE ONE TABLET BY MOUTH EVERY MORNING   fluticasone (FLONASE) 50 MCG/ACT nasal spray Place 2 sprays into both nostrils daily as needed for rhinitis or allergies.   furosemide (LASIX) 20 MG tablet Take 1 tablet (20 mg total) by mouth every morning.   gabapentin (NEURONTIN) 100 MG capsule TAKE 1 CAPSULE BY MOUTH THREE TIMES A DAY for anxiety.   gabapentin (NEURONTIN) 300 MG capsule Take 600 mg by mouth at bedtime.   hydrocortisone 2.5 % cream Apply to affected under breasts 1-2 times a day as directed and as needed for itch. Can also use on face as needed for itch   ketoconazole (NIZORAL) 2 % cream Apply to affected areas under breast  twice daily for rash until healed. Can also use on face as needed for dermatitis   metoprolol tartrate (LOPRESSOR) 25 MG tablet TAKE THREE TABLETS BY MOUTH TWICE DAILY   metroNIDAZOLE (METROCREAM) 0.75 % cream Apply topically 2 (two) times daily. Apply to red areas of face   potassium chloride (MICRO-K) 10 MEQ CR capsule TAKE ONE CAPSULE BY MOUTH EVERY MORNING   sotalol (BETAPACE) 120 MG tablet TAKE ONE TABLET BY MOUTH TWICE DAILY   spironolactone (ALDACTONE) 25 MG tablet Take 1 tablet (25 mg total) by mouth daily.   tiZANidine (ZANAFLEX) 4 MG tablet TAKE 2 TABLET (4 MG TOTAL) BY MOUTH DAILY FOR MUSCLE SPASMS AT BEDTIME.   No facility-administered encounter medications on file as of 08/25/2021.   BP Readings from Last 3 Encounters:  07/26/21 134/62  07/13/21 125/65  06/24/21 (!) 141/70    Lab Results  Component Value Date   HGBA1C 5.8 (A) 06/22/2021    Recent OV, Consult or Hospital visit:  No medication changes indicated  Last adherence delivery date: 08/05/2021      Patient is due for next adherence delivery on:  09/03/2021  Spoke with patient on 08/25/2021 reviewed medications and coordinated delivery.  This delivery to include: Adherence Packaging  30 Days  Packs: Duloxetine 60 mg 1 tablet twice a day (1 breakfast, 1 evening meal) Potassium Chloride 10 MEQ CR Capsule - 1 capsule every morning (1 breakfast) Ezetimibe  10mg  1 tablet daily (breakfast) Diltiazem 180mg  1 tablet daily (breakfast) Metoprolol 25mg  3 tablets twice a day (3 breakfast and 3 evening meal) Sotalol 120mg  1 tablet twice daily (1 breakfast and 1 evening meal) Gabapentin 300mg  2 tablets at bedtime (2 bedtime) Jardiance 10 mg 1 tablet daily (before breakfast) Tizanidine 4mg  2 tablets daily (2 bedtime) Spironolactone 25mg  tablet 1 tablet (breakfast) Furosemide 20 mg - 1 tablet daily (breakfast)  VIAL medications: w/o safety caps Carbidopa-Levodopa 25-100mg  2 Tablets before breakfast 1 tablet at breakfast, 1  tablet at lunch, 1 tablet at evening meal and 2 tablets at bedtime. May take extra tab as need.  Gabapentin 100mg  1 capsule three times a day (1 breakfast, 1 evening meal, 1 bedtime) May take 1 extra daily, PRN in vial.    Patient declined the following medications this month: Ketoconazole Shampoo 2%-  uses PRN Hydrocortisone 2.5% - uses PRN Fluticasone Propionate 50 mcg Place 2 sprays into both nostrils daily   Any concerns about your medications? No  How often do you forget or accidentally miss a dose? Never  Do you use a pillbox? No  Is patient in packaging Yes  If yes  What is the date on your next pill pack? Patient was unable to verify date  Any concerns or issues with your packaging? No  No refill request needed.  Confirmed delivery date of 09/03/2021, advised patient that pharmacy will contact them the morning of delivery.   Recent blood pressure readings are as follows: Patient does not check at home.  Recent blood glucose readings are as follows: Patient does not check at home.   Annual wellness visit in last year? Yes Most Recent BP reading: 125/65 on 07/13/2021  [name], CPP notified  [name], [place] Assistant [phone]  I have reviewed the care management and care coordination activities outlined in this encounter and I am certifying that I agree with the content of this note. No further action required.  [name], PharmD Clinical Pharmacist [facility] at [facility] [phone]

## 2021-08-26 DIAGNOSIS — R69 Illness, unspecified: Secondary | ICD-10-CM | POA: Diagnosis not present

## 2021-08-26 DIAGNOSIS — F330 Major depressive disorder, recurrent, mild: Secondary | ICD-10-CM | POA: Diagnosis not present

## 2021-08-26 DIAGNOSIS — F4312 Post-traumatic stress disorder, chronic: Secondary | ICD-10-CM | POA: Diagnosis not present

## 2021-08-30 ENCOUNTER — Telehealth: Payer: Self-pay

## 2021-08-30 NOTE — Progress Notes (Signed)
° ° °  Chronic Care Management Pharmacy Assistant   Name: Selena Bush  MRN: [id number] DOB: 1946-02-26  Reason for Encounter: CCM (Furosemide transfer)   [facility].   [name], CPP notified  [name], [place] [phone]  Time Spent: 10 Minutes

## 2021-09-06 ENCOUNTER — Encounter: Payer: Self-pay | Admitting: Family

## 2021-09-06 ENCOUNTER — Ambulatory Visit: Payer: Medicare HMO | Attending: Family | Admitting: Family

## 2021-09-06 ENCOUNTER — Other Ambulatory Visit: Payer: Self-pay

## 2021-09-06 VITALS — BP 128/71 | HR 57 | Resp 18 | Ht 62.0 in | Wt 170.0 lb

## 2021-09-06 DIAGNOSIS — I110 Hypertensive heart disease with heart failure: Secondary | ICD-10-CM | POA: Insufficient documentation

## 2021-09-06 DIAGNOSIS — I5032 Chronic diastolic (congestive) heart failure: Secondary | ICD-10-CM | POA: Diagnosis not present

## 2021-09-06 DIAGNOSIS — G20 Parkinson's disease: Secondary | ICD-10-CM | POA: Diagnosis not present

## 2021-09-06 DIAGNOSIS — F419 Anxiety disorder, unspecified: Secondary | ICD-10-CM | POA: Diagnosis not present

## 2021-09-06 DIAGNOSIS — I4891 Unspecified atrial fibrillation: Secondary | ICD-10-CM | POA: Insufficient documentation

## 2021-09-06 DIAGNOSIS — E785 Hyperlipidemia, unspecified: Secondary | ICD-10-CM | POA: Diagnosis not present

## 2021-09-06 DIAGNOSIS — Z853 Personal history of malignant neoplasm of breast: Secondary | ICD-10-CM | POA: Insufficient documentation

## 2021-09-06 DIAGNOSIS — G4733 Obstructive sleep apnea (adult) (pediatric): Secondary | ICD-10-CM | POA: Diagnosis not present

## 2021-09-06 DIAGNOSIS — I10 Essential (primary) hypertension: Secondary | ICD-10-CM

## 2021-09-06 DIAGNOSIS — I480 Paroxysmal atrial fibrillation: Secondary | ICD-10-CM

## 2021-09-06 DIAGNOSIS — F32A Depression, unspecified: Secondary | ICD-10-CM | POA: Insufficient documentation

## 2021-09-06 DIAGNOSIS — R69 Illness, unspecified: Secondary | ICD-10-CM | POA: Diagnosis not present

## 2021-09-06 NOTE — Progress Notes (Signed)
Patient ID: Selena Bush, female    DOB: [birth year], 76 y.o.   MRN: [id number]   Selena Bush is a 76 y/o female with a history of breast cancer, hyperlipidemia, HNT, anxiety, atrial fibrillation, parkinson's disorder, DDD, depression, GERD, gout, obstructive sleep apnea and chronic heart failure.   Echo report from 04/29/21 reviewed and showed an EF of 65-70% along with mild LVH, moderately elevated PA pressure, severe LAE and possible PFO.  Was in the ED 06/05/21 due to cough, congestion and shortness of breath. Given IV lasix with improvement in symptoms and she was released.    She presents today for a follow-up visit with a chief complaint of minimal fatigue upon moderate exertion. She describes this as chronic in nature having been present for several years although has steadily improved over the last several months. She has associated pedal edema, easy bruising, right knee pain and chronic difficulty sleeping along with this. She denies any dizziness, abdominal distention, palpitations, chest pain, shortness of breath, cough or weight gain.   She weighs daily and endorses they are mostly consistent within 1-2 lbs. She does advise on a recent occasion that she gained 4 lbs/night but did not have any associated s/s with this. Advised her to call and report this to the HF clinic when it occurs.   Past Medical History:  Diagnosis Date   Anxiety    Atrial fibrillation ([place])    Cat bite of right hand 03/25/2019   Chronic diastolic CHF (congestive heart failure) ([place])    a. echo 09/2014: EF 75-10%, diastolic dysfunction, mild LVH, nl RV size & systolic function, mildly dilated LA (4.3 cm), mild MR/TR, mildly elevated PASP 36.7 mm Hg   DDD (degenerative disc disease), cervical    DDD (degenerative disc disease), lumbar    Depression    Diffuse cystic mastopathy 2014   Dyspnea    Dysrhythmia    GERD (gastroesophageal reflux disease)    Gout    Headache    rare   HLD (hyperlipidemia)    a.  statin intolerant 2/2 myalgias   Hx of dysplastic nevus 12/25/2018   L medial ankle   Hypercholesterolemia    Hypertension    Malignant neoplasm of upper-outer quadrant of female breast ([place]) 10/2012   Papillary DCIS, sentinel node negative. DR/PR positive. PARTIAL RIGHT MASTECTOMY FOR BREAST CANCER--HAD RADIATION - NO CHEMO --DR. CRYSTAL [place] ONCOLOGIST   Obesity    OSA on CPAP    Osteoarthritis of both knees    a. s/p right TKA 04/2013 & left TKA 09/2014   Otitis externa    Parkinson disease [facility])    Personal history of radiation therapy 2015   RIGHT breast-mammosite per pt   Pneumonia of both lungs due to infectious organism 01/08/2021   Sleep difficulties    LUNESTA HAS HELPED   Vaginal cyst     Past Surgical History:  Procedure Laterality Date   ABDOMINAL HYSTERECTOMY  1992   DUB; fibroids; endometriosis.  One remaining ovary.     BREAST LUMPECTOMY Right 2015   Papillary DCIS, sentinel node negative. DR/PR positive. PARTIAL RIGHT MASTECTOMY FOR BREAST CANCER--HAD RADIATION - NO CHEMO --DR. CRYSTAL [place] ONCOLOGIST   BREAST SURGERY Right 10/2012   Wide excision,APB RT 10 mm papillary DCIS, ER/PR positive. Sentinel node negative. Partial breast radiation.   CATARACT EXTRACTION, BILATERAL  02/13/2016   [facility].   CHOLECYSTECTOMY  1994   COLONOSCOPY  2015   1 benign polyp-every 5 years/ Dr Candace Cruise  COLONOSCOPY WITH PROPOFOL N/A 02/10/2021   Procedure: COLONOSCOPY WITH PROPOFOL;  Surgeon: Robert Bellow, MD;  Location: [facility] ENDOSCOPY;  Service: Endoscopy;  Laterality: N/A;   ERCP  1995   JOINT REPLACEMENT Right 04/2013   knee   PERICARDIOCENTESIS N/A 11/13/2017   Procedure: PERICARDIOCENTESIS;  Surgeon: Nelva Bush, MD;  Location: [place] CV LAB;  Service: Cardiovascular;  Laterality: N/A;   TOTAL KNEE ARTHROPLASTY Right 04/16/2013   Procedure: RIGHT TOTAL KNEE ARTHROPLASTY;  Surgeon: Mauri Pole, MD;  Location: [facility] ORS;  Service: Orthopedics;  Laterality:  Right;   TOTAL KNEE ARTHROPLASTY Left 09/15/2014   Procedure: LEFT TOTAL KNEE ARTHROPLASTY;  Surgeon: Mauri Pole, MD;  Location: [facility] ORS;  Service: Orthopedics;  Laterality: Left;   TUBAL LIGATION  1979   UPPER GI ENDOSCOPY     Family History  Problem Relation Age of Onset   Colon cancer Mother    Cancer Mother 1       colon cancer   Melanoma Father    Cancer Father 84       melanoma   Colon cancer Maternal Grandfather    Breast cancer Maternal Grandfather    Melanoma Sister    Melanoma Sister    Social History   Tobacco Use   Smoking status: Never   Smokeless tobacco: Never   Tobacco comments:    social smoker as a teen  Substance Use Topics   Alcohol use: No   Allergies  Allergen Reactions   Penicillins Anaphylaxis    anaphylaxis  Has patient had a PCN reaction causing immediate rash, facial/tongue/throat swelling, SOB or lightheadedness with hypotension: Yes Has patient had a PCN reaction causing severe rash involving mucus membranes or skin necrosis: No Has patient had a PCN reaction that required hospitalization: No Has patient had a PCN reaction occurring within the last 10 years: No If all of the above answers are "NO", then may proceed with Cephalosporin use.    Sulfa Antibiotics Anaphylaxis   Codeine Other (See Comments) and Nausea And Vomiting    Gi problems    Statins Other (See Comments)    Leg pains   Aspirin Other (See Comments)    "burned my stomach intensely" Abdominal pain and burning   Prior to Admission medications   Medication Sig Start Date End Date Taking? Authorizing Provider  acetaminophen (TYLENOL) 500 MG tablet Take by mouth.   Yes [provider]  albuterol (VENTOLIN HFA) 108 (90 Base) MCG/ACT inhaler Inhale 2 puffs into the lungs every 4 (four) hours as needed. 12/07/20  Yes [provider]  carbidopa-levodopa (SINEMET IR) 25-100 MG tablet Take by mouth. Take 2 tablets at 6 AM, 1 at 9AM, 1 tab at 12 PM,  2 at 3 PM,  1 at 6 PM and 1 at 9 PM. 02/12/20  Yes [provider]  diltiazem (CARDIZEM CD) 180 MG 24 hr capsule TAKE ONE TABLET BY MOUTH EVERY MORNING 10/26/20  Yes Lelon Perla, MD  DULoxetine (CYMBALTA) 60 MG capsule TAKE 1 CAPSULE BY MOUTH 2 TIMES A DAY for anxiety and depression. 01/05/21  Yes Pleas Koch, NP  empagliflozin (JARDIANCE) 10 MG TABS tablet Take 1 tablet (10 mg total) by mouth daily before breakfast. 07/13/21  Yes Darylene Price A, FNP  ezetimibe (ZETIA) 10 MG tablet TAKE ONE TABLET BY MOUTH EVERY MORNING 06/23/21  Yes Lelon Perla, MD  fluticasone (FLONASE) 50 MCG/ACT nasal spray Place 2 sprays into both nostrils daily as needed for  rhinitis or allergies. 10/06/20  Yes Pleas Koch, NP  furosemide (LASIX) 20 MG tablet Take 1 tablet (20 mg total) by mouth every morning. 06/07/21  Yes Lelon Perla, MD  gabapentin (NEURONTIN) 100 MG capsule TAKE 1 CAPSULE BY MOUTH THREE TIMES A DAY for anxiety. 11/13/19  Yes Pleas Koch, NP  gabapentin (NEURONTIN) 300 MG capsule Take 600 mg by mouth at bedtime. 01/04/21  Yes [provider]  hydrocortisone 2.5 % cream Apply to affected under breasts 1-2 times a day as directed and as needed for itch. Can also use on face as needed for itch 01/05/21  Yes Brendolyn Patty, MD  ketoconazole (NIZORAL) 2 % cream Apply to affected areas under breast twice daily for rash until healed. Can also use on face as needed for dermatitis 01/05/21  Yes Brendolyn Patty, MD  metoprolol tartrate (LOPRESSOR) 25 MG tablet TAKE THREE TABLETS BY MOUTH TWICE DAILY 06/23/21  Yes Lelon Perla, MD  metroNIDAZOLE (METROCREAM) 0.75 % cream Apply topically 2 (two) times daily. Apply to red areas of face 01/05/21 01/05/22 Yes Brendolyn Patty, MD  potassium chloride (MICRO-K) 10 MEQ CR capsule TAKE ONE CAPSULE BY MOUTH EVERY MORNING 06/23/21  Yes Loel Dubonnet, NP  sotalol (BETAPACE) 120 MG tablet TAKE ONE TABLET BY MOUTH TWICE DAILY 05/26/21  Yes  Lelon Perla, MD  tiZANidine (ZANAFLEX) 4 MG tablet TAKE 2 TABLET (4 MG TOTAL) BY MOUTH DAILY FOR MUSCLE SPASMS AT BEDTIME. 06/22/21  Yes Pleas Koch, NP  empagliflozin (JARDIANCE) 10 MG TABS tablet Take 1 tablet (10 mg total) by mouth daily before breakfast. 07/13/21   Alisa Graff, FNP  spironolactone (ALDACTONE) 25 MG tablet Take 1 tablet (25 mg total) by mouth daily. 05/21/21 08/19/21  Lendon Colonel, NP    Review of Systems  Constitutional:  Positive for fatigue ("improving"). Negative for appetite change.  HENT:  Negative for congestion, postnasal drip and sore throat.   Eyes: Negative.   Respiratory:  Negative for cough, chest tightness and shortness of breath.   Cardiovascular:  Negative for chest pain, palpitations and leg swelling.  Gastrointestinal:  Negative for abdominal distention and abdominal pain.  Endocrine: Negative.   Genitourinary: Negative.   Musculoskeletal:  Positive for arthralgias (right knee). Negative for back pain and neck pain.  Skin: Negative.   Allergic/Immunologic: Negative.   Neurological:  Negative for dizziness, light-headedness and headaches.  Hematological:  Negative for adenopathy. Bruises/bleeds easily.  Psychiatric/Behavioral:  Positive for sleep disturbance (wakes frequently throughout the night and not wearing her CPAP). Negative for dysphoric mood.    Vitals:   09/06/21 1414  BP: 128/71  Pulse: (!) 57  Resp: 18  SpO2: 97%  Weight: 170 lb (77.1 kg)  Height: 5\' 2"  (1.575 m)   Wt Readings from Last 3 Encounters:  09/06/21 170 lb (77.1 kg)  07/26/21 169 lb 12.8 oz (77 kg)  07/13/21 172 lb 2 oz (78.1 kg)   Lab Results  Component Value Date   CREATININE 1.27 (H) 07/13/2021   CREATININE 1.50 (H) 06/10/2021   CREATININE 1.39 (H) 06/08/2021    Physical Exam Vitals and nursing note reviewed. Exam conducted with a chaperone present (caregiver-[name]).  Constitutional:      General: She is not in acute distress.     Appearance: Normal appearance.  HENT:     Head: Normocephalic and atraumatic.  Cardiovascular:     Rate and Rhythm: Regular rhythm. Bradycardia present.  Pulmonary:     Effort:  Pulmonary effort is normal. No respiratory distress.     Breath sounds: No wheezing or rales.  Abdominal:     General: There is no distension.     Palpations: Abdomen is soft.  Musculoskeletal:        General: No tenderness.     Cervical back: Normal range of motion and neck supple.     Right lower leg: Edema (ankle) present.     Left lower leg: Edema (ankle) present.  Skin:    General: Skin is warm and dry.     Findings: Bruising (tops of both hands) present.  Neurological:     Mental Status: She is alert and oriented to person, place, and time. Mental status is at baseline.  Psychiatric:        Mood and Affect: Mood normal.        Behavior: Behavior normal.        Thought Content: Thought content normal.    Assessment & Plan:  1: Chronic heart failure with preserved ejection fraction with structural changes (LVH/LAE)- - NYHA class II - euvolemic today - weighing daily; reminded to call for an overnight weight gain of > 2 pounds or a weekly weight gain of > 5 pounds - weight down 2 pounds from last visit here  - saw cardiology Stanford Breed) 07/26/21  - not adding salt to her food; has a live in caregiver and they try and look at food labels  - on GDMT of SGLT2 - consider Entresto next visit - BNP 06/08/21 was 122.8  2: HTN- - BP looks good today 128/71 - saw PCP Carlis Abbott) 06/22/21 - BMP 07/13/21 reviewed and showed sodium 133, K 4.7, creatinine 1.27 & GFR 45  3: Atrial fibrillation- - RR today  4: Sleep apnea- - normally wears CPAP but hadn't been wearing since hospitalization - now feels like she's gotten out of the habit of wearing it and admits that she needs to try and start wearing it again   Medication list reviewed.   Return in 2 months or sooner for any questions/problems before then.

## 2021-09-06 NOTE — Patient Instructions (Addendum)
The [facility] will be moving around the corner to [address] mid-February. Our phone number will remain the same.  Call or return if you have a weight gain >3lbs/night or 5 lbs/week.   Try to get in the habit of wearing your CPAP nightly.   Return in March.

## 2021-09-09 DIAGNOSIS — Z0100 Encounter for examination of eyes and vision without abnormal findings: Secondary | ICD-10-CM | POA: Diagnosis not present

## 2021-09-09 DIAGNOSIS — H52223 Regular astigmatism, bilateral: Secondary | ICD-10-CM | POA: Diagnosis not present

## 2021-09-09 DIAGNOSIS — H524 Presbyopia: Secondary | ICD-10-CM | POA: Diagnosis not present

## 2021-09-16 DIAGNOSIS — R251 Tremor, unspecified: Secondary | ICD-10-CM | POA: Diagnosis not present

## 2021-09-16 DIAGNOSIS — R262 Difficulty in walking, not elsewhere classified: Secondary | ICD-10-CM | POA: Diagnosis not present

## 2021-09-16 DIAGNOSIS — G2581 Restless legs syndrome: Secondary | ICD-10-CM | POA: Diagnosis not present

## 2021-09-16 DIAGNOSIS — R296 Repeated falls: Secondary | ICD-10-CM | POA: Diagnosis not present

## 2021-09-16 DIAGNOSIS — R29898 Other symptoms and signs involving the musculoskeletal system: Secondary | ICD-10-CM | POA: Diagnosis not present

## 2021-09-16 DIAGNOSIS — M25642 Stiffness of left hand, not elsewhere classified: Secondary | ICD-10-CM | POA: Diagnosis not present

## 2021-09-16 DIAGNOSIS — Z9181 History of falling: Secondary | ICD-10-CM | POA: Diagnosis not present

## 2021-09-16 DIAGNOSIS — G20 Parkinson's disease: Secondary | ICD-10-CM | POA: Diagnosis not present

## 2021-09-16 DIAGNOSIS — R200 Anesthesia of skin: Secondary | ICD-10-CM | POA: Diagnosis not present

## 2021-09-20 ENCOUNTER — Telehealth: Payer: Self-pay | Admitting: Cardiology

## 2021-09-20 ENCOUNTER — Telehealth: Payer: Self-pay

## 2021-09-20 NOTE — Telephone Encounter (Signed)
Patient calling in to see if its okay for her to take these three medication. Oxybutynin, mirabegron, and tolterodina it from bladder spasms urinary retention

## 2021-09-20 NOTE — Telephone Encounter (Signed)
Error

## 2021-09-20 NOTE — Telephone Encounter (Signed)
Spoke with pt, aware of the pharmacist recommendations.

## 2021-09-20 NOTE — Telephone Encounter (Signed)
She should be fine to try those meds.

## 2021-09-22 ENCOUNTER — Telehealth (HOSPITAL_BASED_OUTPATIENT_CLINIC_OR_DEPARTMENT_OTHER): Payer: Self-pay

## 2021-09-22 ENCOUNTER — Other Ambulatory Visit (HOSPITAL_BASED_OUTPATIENT_CLINIC_OR_DEPARTMENT_OTHER): Payer: Self-pay | Admitting: Family

## 2021-09-22 ENCOUNTER — Other Ambulatory Visit (HOSPITAL_BASED_OUTPATIENT_CLINIC_OR_DEPARTMENT_OTHER): Payer: Self-pay

## 2021-09-22 DIAGNOSIS — I5032 Chronic diastolic (congestive) heart failure: Secondary | ICD-10-CM

## 2021-09-22 NOTE — Progress Notes (Signed)
Msg left for patient to return call.  Pharmacy notified of refill decline in case patient came to pick up medication before calling the office.

## 2021-09-22 NOTE — Telephone Encounter (Signed)
Refill request received from patient pharmacy for Potassium.  Per [name] pt. Should stop potassium and have BMET in 2 to 3 weeks. Message left for patient requesting return call for the above instructions.  Order for BMET enter have not mailed Req until pt. Returns call. [facility]Suits [facility] RN CCM

## 2021-09-23 ENCOUNTER — Telehealth: Payer: Self-pay

## 2021-09-23 DIAGNOSIS — Z9181 History of falling: Secondary | ICD-10-CM | POA: Diagnosis not present

## 2021-09-23 DIAGNOSIS — E669 Obesity, unspecified: Secondary | ICD-10-CM | POA: Diagnosis not present

## 2021-09-23 DIAGNOSIS — M1712 Unilateral primary osteoarthritis, left knee: Secondary | ICD-10-CM | POA: Diagnosis not present

## 2021-09-23 DIAGNOSIS — M109 Gout, unspecified: Secondary | ICD-10-CM | POA: Diagnosis not present

## 2021-09-23 DIAGNOSIS — I4891 Unspecified atrial fibrillation: Secondary | ICD-10-CM | POA: Diagnosis not present

## 2021-09-23 DIAGNOSIS — R69 Illness, unspecified: Secondary | ICD-10-CM | POA: Diagnosis not present

## 2021-09-23 DIAGNOSIS — G4733 Obstructive sleep apnea (adult) (pediatric): Secondary | ICD-10-CM | POA: Diagnosis not present

## 2021-09-23 DIAGNOSIS — G20 Parkinson's disease: Secondary | ICD-10-CM | POA: Diagnosis not present

## 2021-09-23 DIAGNOSIS — M5030 Other cervical disc degeneration, unspecified cervical region: Secondary | ICD-10-CM | POA: Diagnosis not present

## 2021-09-23 DIAGNOSIS — I110 Hypertensive heart disease with heart failure: Secondary | ICD-10-CM | POA: Diagnosis not present

## 2021-09-23 DIAGNOSIS — K219 Gastro-esophageal reflux disease without esophagitis: Secondary | ICD-10-CM | POA: Diagnosis not present

## 2021-09-23 DIAGNOSIS — Z7984 Long term (current) use of oral hypoglycemic drugs: Secondary | ICD-10-CM | POA: Diagnosis not present

## 2021-09-23 DIAGNOSIS — I509 Heart failure, unspecified: Secondary | ICD-10-CM | POA: Diagnosis not present

## 2021-09-23 DIAGNOSIS — G2581 Restless legs syndrome: Secondary | ICD-10-CM | POA: Diagnosis not present

## 2021-09-23 DIAGNOSIS — M5136 Other intervertebral disc degeneration, lumbar region: Secondary | ICD-10-CM | POA: Diagnosis not present

## 2021-09-23 DIAGNOSIS — Z853 Personal history of malignant neoplasm of breast: Secondary | ICD-10-CM | POA: Diagnosis not present

## 2021-09-23 NOTE — Telephone Encounter (Signed)
See phone note 09/22/21. Pt on potassium with high normal potassium. Recommend discontinue with BMP in 2-3 weeks for monitoring.

## 2021-09-23 NOTE — Progress Notes (Addendum)
Chronic Care Management Pharmacy Assistant   Name: Selena Bush  MRN: [id number] DOB: 11/18/1945  Reason for Encounter: CCM (Medication Adherence and Delivery Coordination)   Recent office visits:  None since last CCM contact  Recent consult visits:  09/20/2021 - Gurney Maxin - Neurology - Patient presented for parkinson's disease. Referral to home health.  09/06/2021 - Darylene Price, FNP - Patient presented for congestive heart failure. No medication changes.   Hospital visits:  None since last CCM contact  Medications: Outpatient Encounter Medications as of 09/23/2021  Medication Sig   acetaminophen (TYLENOL) 500 MG tablet Take by mouth.   albuterol (VENTOLIN HFA) 108 (90 Base) MCG/ACT inhaler Inhale 2 puffs into the lungs every 4 (four) hours as needed.   carbidopa-levodopa (SINEMET IR) 25-100 MG tablet Take by mouth. Take 2 tablets at 6 AM, 1 at 9AM, 1 tab at 12 PM,  2 at 3 PM, 1 at 6 PM and 1 at 9 PM.   diltiazem (CARDIZEM CD) 180 MG 24 hr capsule TAKE ONE CAPSULE BY MOUTH EVERY MORNING   DULoxetine (CYMBALTA) 60 MG capsule TAKE ONE CAPSULE BY MOUTH TWICE DAILY FOR anxiety AND depression   empagliflozin (JARDIANCE) 10 MG TABS tablet Take 1 tablet (10 mg total) by mouth daily before breakfast.   ezetimibe (ZETIA) 10 MG tablet TAKE ONE TABLET BY MOUTH EVERY MORNING   fluticasone (FLONASE) 50 MCG/ACT nasal spray Place 2 sprays into both nostrils daily as needed for rhinitis or allergies.   furosemide (LASIX) 20 MG tablet Take 1 tablet (20 mg total) by mouth every morning.   gabapentin (NEURONTIN) 100 MG capsule TAKE 1 CAPSULE BY MOUTH THREE TIMES A DAY for anxiety.   gabapentin (NEURONTIN) 300 MG capsule Take 600 mg by mouth at bedtime.   hydrocortisone 2.5 % cream Apply to affected under breasts 1-2 times a day as directed and as needed for itch. Can also use on face as needed for itch   ketoconazole (NIZORAL) 2 % cream Apply to affected areas under breast twice daily for  rash until healed. Can also use on face as needed for dermatitis   metoprolol tartrate (LOPRESSOR) 25 MG tablet TAKE THREE TABLETS BY MOUTH TWICE DAILY   metroNIDAZOLE (METROCREAM) 0.75 % cream Apply topically 2 (two) times daily. Apply to red areas of face   potassium chloride (MICRO-K) 10 MEQ CR capsule TAKE ONE CAPSULE BY MOUTH EVERY MORNING   sotalol (BETAPACE) 120 MG tablet TAKE ONE TABLET BY MOUTH TWICE DAILY   spironolactone (ALDACTONE) 25 MG tablet Take 1 tablet (25 mg total) by mouth daily.   tiZANidine (ZANAFLEX) 4 MG tablet TAKE 2 TABLET (4 MG TOTAL) BY MOUTH DAILY FOR MUSCLE SPASMS AT BEDTIME.   No facility-administered encounter medications on file as of 09/23/2021.   BP Readings from Last 3 Encounters:  09/06/21 128/71  07/26/21 134/62  07/13/21 125/65    Lab Results  Component Value Date   HGBA1C 5.8 (A) 06/22/2021    Recent OV, Consult or Hospital visit:  No medication changes indicated  Last adherence delivery date: 09/03/2021      Patient is due for next adherence delivery on:  10/05/2021  Spoke with patient on 09/23/2021 reviewed medications and coordinated delivery.  This delivery to include: Adherence Packaging  30 Days  Packs: Furosemide 20 mg - 1 tablet daily (breakfast) Spironolactone 25mg  tablet 1 tablet (breakfast) Jardiance 10 mg 1 tablet daily (before breakfast) Tizanidine 4mg  2 tablets daily (2 bedtime) Gabapentin 300mg  2  tablets at bedtime (2 bedtime) Sotalol 120mg  1 tablet twice daily (1 breakfast and 1 evening meal) Metoprolol 25mg  3 tablets twice a day (3 breakfast and 3 evening meal) Diltiazem 180mg  1 tablet daily (breakfast) Ezetimibe 10mg  1 tablet daily (breakfast) Duloxetine 60 mg 1 tablet twice a day (1 breakfast, 1 evening meal)   VIAL medications: w/o safety caps Carbidopa-Levodopa 25-100mg  2 Tablets before breakfast 1 tablet at breakfast, 1 tablet at lunch, 1 tablet at evening meal and 2 tablets at bedtime. May take extra tab as need.   Gabapentin 100mg  1 capsule three times a day (1 breakfast, 1 evening meal, 1 bedtime) May take 1 extra daily, PRN in vial.    Patient declined the following medications this month: Ketoconazole Shampoo 2%-  uses PRN Hydrocortisone 2.5% - uses PRN Fluticasone Propionate 50 mcg Place 2 sprays into both nostrils daily   Provider declined refill: Potassium Chloride 10 MEQ CR Capsule - 1 capsule every morning (1 breakfast) - denied by provider; patient must make appt and have lab work done before they will refill. I advised patient to call [name], NP office and set up an appointment and to have lab work done. Patient expressed understanding.   Any concerns about your medications? No  How often do you forget or accidentally miss a dose? Never  Do you use a pillbox? No  Is patient in packaging Yes  If yes  What is the date on your next pill pack? 09/23/2021 Evening Meal  Any concerns or issues with your packaging? No  Refills requested from providers include: Potassium Chloride 10 MEQ CR Capsule - 1 capsule every morning (1 breakfast) - denied by provider; patient must make appt and have lab work done before they will refill.   Confirmed delivery date of 10/05/2021, advised patient that pharmacy will contact them the morning of delivery.   Recent blood pressure readings are as follows: Patient does not check at home.   Recent blood glucose readings are as follows: Patient does not check at home.  Annual wellness visit in last year? Yes 04/13/2021 Most Recent BP reading: 128/71 on 09/06/2021  [name], CPP notified  [name], [place] Assistant [phone]  I have reviewed the care management and care coordination activities outlined in this encounter and I am certifying that I agree with the content of this note. No further action required.  [name], PharmD Clinical Pharmacist [facility] at [facility] [phone]

## 2021-09-24 ENCOUNTER — Telehealth (HOSPITAL_BASED_OUTPATIENT_CLINIC_OR_DEPARTMENT_OTHER): Payer: Self-pay

## 2021-09-24 NOTE — Telephone Encounter (Signed)
Left message with the below instructions, will also send this through mychart. Lab slips mailed to patient!     "Refill request received from patient pharmacy for Potassium.  Per [name] pt. Should stop potassium and have BMET in 2 to 3 weeks. Message left for patient requesting return call for the above instructions.  Order for BMET enter have not mailed Req until pt. Returns call. TSuits MHA RN CCM"

## 2021-09-27 DIAGNOSIS — F330 Major depressive disorder, recurrent, mild: Secondary | ICD-10-CM | POA: Diagnosis not present

## 2021-09-27 DIAGNOSIS — F4312 Post-traumatic stress disorder, chronic: Secondary | ICD-10-CM | POA: Diagnosis not present

## 2021-09-27 DIAGNOSIS — R69 Illness, unspecified: Secondary | ICD-10-CM | POA: Diagnosis not present

## 2021-09-30 DIAGNOSIS — M109 Gout, unspecified: Secondary | ICD-10-CM | POA: Diagnosis not present

## 2021-09-30 DIAGNOSIS — G20 Parkinson's disease: Secondary | ICD-10-CM | POA: Diagnosis not present

## 2021-09-30 DIAGNOSIS — M5030 Other cervical disc degeneration, unspecified cervical region: Secondary | ICD-10-CM | POA: Diagnosis not present

## 2021-09-30 DIAGNOSIS — I4891 Unspecified atrial fibrillation: Secondary | ICD-10-CM | POA: Diagnosis not present

## 2021-09-30 DIAGNOSIS — R69 Illness, unspecified: Secondary | ICD-10-CM | POA: Diagnosis not present

## 2021-09-30 DIAGNOSIS — I509 Heart failure, unspecified: Secondary | ICD-10-CM | POA: Diagnosis not present

## 2021-09-30 DIAGNOSIS — I110 Hypertensive heart disease with heart failure: Secondary | ICD-10-CM | POA: Diagnosis not present

## 2021-09-30 DIAGNOSIS — M1712 Unilateral primary osteoarthritis, left knee: Secondary | ICD-10-CM | POA: Diagnosis not present

## 2021-09-30 DIAGNOSIS — M5136 Other intervertebral disc degeneration, lumbar region: Secondary | ICD-10-CM | POA: Diagnosis not present

## 2021-09-30 NOTE — Telephone Encounter (Signed)
Unable to include gabapentin 100 mg in upcoming packs due to patient called for early fill on 09/20/21. Unclear why early fill was needed. Spoke with patient and explained above. She confirmed understanding.  Also contact Dr Manuella Ghazi for oxybutynin rx (5 mg BID), so that we can include this in upcoming packs. Pt affirmed she would like this in packs.   [name], PharmD Clinical Pharmacist Practitioner [facility] at [facility] [phone]

## 2021-10-01 DIAGNOSIS — G20 Parkinson's disease: Secondary | ICD-10-CM | POA: Diagnosis not present

## 2021-10-01 DIAGNOSIS — M1712 Unilateral primary osteoarthritis, left knee: Secondary | ICD-10-CM | POA: Diagnosis not present

## 2021-10-01 DIAGNOSIS — I4891 Unspecified atrial fibrillation: Secondary | ICD-10-CM | POA: Diagnosis not present

## 2021-10-01 DIAGNOSIS — R69 Illness, unspecified: Secondary | ICD-10-CM | POA: Diagnosis not present

## 2021-10-01 DIAGNOSIS — I110 Hypertensive heart disease with heart failure: Secondary | ICD-10-CM | POA: Diagnosis not present

## 2021-10-01 DIAGNOSIS — M5136 Other intervertebral disc degeneration, lumbar region: Secondary | ICD-10-CM | POA: Diagnosis not present

## 2021-10-01 DIAGNOSIS — M5030 Other cervical disc degeneration, unspecified cervical region: Secondary | ICD-10-CM | POA: Diagnosis not present

## 2021-10-01 DIAGNOSIS — I509 Heart failure, unspecified: Secondary | ICD-10-CM | POA: Diagnosis not present

## 2021-10-01 DIAGNOSIS — M109 Gout, unspecified: Secondary | ICD-10-CM | POA: Diagnosis not present

## 2021-10-04 NOTE — Progress Notes (Addendum)
Called patient to inform her [facility] is waiting on the prescription for Oxybutynin from Dr. Melrose Nakayama; medication delivery will not include this medication. [facility] will send it to her when they receive the RX.   Selena Bush, CPP notified  Selena Bush, [place] Clinical Pharmacy Assistant [phone]  I have reviewed the care management and care coordination activities outlined in this encounter and I am certifying that I agree with the content of this note. No further action required.  Selena Bush, PharmD Clinical Pharmacist [facility] at [facility] [phone]

## 2021-10-05 DIAGNOSIS — M1712 Unilateral primary osteoarthritis, left knee: Secondary | ICD-10-CM | POA: Diagnosis not present

## 2021-10-05 DIAGNOSIS — M5030 Other cervical disc degeneration, unspecified cervical region: Secondary | ICD-10-CM | POA: Diagnosis not present

## 2021-10-05 DIAGNOSIS — M5136 Other intervertebral disc degeneration, lumbar region: Secondary | ICD-10-CM | POA: Diagnosis not present

## 2021-10-05 DIAGNOSIS — M109 Gout, unspecified: Secondary | ICD-10-CM | POA: Diagnosis not present

## 2021-10-05 DIAGNOSIS — I110 Hypertensive heart disease with heart failure: Secondary | ICD-10-CM | POA: Diagnosis not present

## 2021-10-05 DIAGNOSIS — R69 Illness, unspecified: Secondary | ICD-10-CM | POA: Diagnosis not present

## 2021-10-05 DIAGNOSIS — I4891 Unspecified atrial fibrillation: Secondary | ICD-10-CM | POA: Diagnosis not present

## 2021-10-05 DIAGNOSIS — I509 Heart failure, unspecified: Secondary | ICD-10-CM | POA: Diagnosis not present

## 2021-10-05 DIAGNOSIS — G20 Parkinson's disease: Secondary | ICD-10-CM | POA: Diagnosis not present

## 2021-10-07 DIAGNOSIS — R69 Illness, unspecified: Secondary | ICD-10-CM | POA: Diagnosis not present

## 2021-10-07 DIAGNOSIS — M1712 Unilateral primary osteoarthritis, left knee: Secondary | ICD-10-CM | POA: Diagnosis not present

## 2021-10-07 DIAGNOSIS — I4891 Unspecified atrial fibrillation: Secondary | ICD-10-CM | POA: Diagnosis not present

## 2021-10-07 DIAGNOSIS — G20 Parkinson's disease: Secondary | ICD-10-CM | POA: Diagnosis not present

## 2021-10-07 DIAGNOSIS — M5030 Other cervical disc degeneration, unspecified cervical region: Secondary | ICD-10-CM | POA: Diagnosis not present

## 2021-10-07 DIAGNOSIS — I110 Hypertensive heart disease with heart failure: Secondary | ICD-10-CM | POA: Diagnosis not present

## 2021-10-07 DIAGNOSIS — I509 Heart failure, unspecified: Secondary | ICD-10-CM | POA: Diagnosis not present

## 2021-10-07 DIAGNOSIS — M109 Gout, unspecified: Secondary | ICD-10-CM | POA: Diagnosis not present

## 2021-10-07 DIAGNOSIS — M5136 Other intervertebral disc degeneration, lumbar region: Secondary | ICD-10-CM | POA: Diagnosis not present

## 2021-10-11 ENCOUNTER — Ambulatory Visit: Payer: Medicare HMO | Attending: Family | Admitting: Family

## 2021-10-11 ENCOUNTER — Encounter: Payer: Self-pay | Admitting: Family

## 2021-10-11 ENCOUNTER — Other Ambulatory Visit: Payer: Self-pay

## 2021-10-11 VITALS — BP 139/82 | HR 63 | Resp 16 | Ht 62.0 in | Wt 166.1 lb

## 2021-10-11 DIAGNOSIS — K219 Gastro-esophageal reflux disease without esophagitis: Secondary | ICD-10-CM | POA: Diagnosis not present

## 2021-10-11 DIAGNOSIS — F419 Anxiety disorder, unspecified: Secondary | ICD-10-CM | POA: Insufficient documentation

## 2021-10-11 DIAGNOSIS — E785 Hyperlipidemia, unspecified: Secondary | ICD-10-CM | POA: Insufficient documentation

## 2021-10-11 DIAGNOSIS — G4730 Sleep apnea, unspecified: Secondary | ICD-10-CM | POA: Diagnosis not present

## 2021-10-11 DIAGNOSIS — I4891 Unspecified atrial fibrillation: Secondary | ICD-10-CM | POA: Insufficient documentation

## 2021-10-11 DIAGNOSIS — I110 Hypertensive heart disease with heart failure: Secondary | ICD-10-CM | POA: Insufficient documentation

## 2021-10-11 DIAGNOSIS — M109 Gout, unspecified: Secondary | ICD-10-CM | POA: Diagnosis not present

## 2021-10-11 DIAGNOSIS — I10 Essential (primary) hypertension: Secondary | ICD-10-CM

## 2021-10-11 DIAGNOSIS — Z853 Personal history of malignant neoplasm of breast: Secondary | ICD-10-CM | POA: Diagnosis not present

## 2021-10-11 DIAGNOSIS — F32A Depression, unspecified: Secondary | ICD-10-CM | POA: Insufficient documentation

## 2021-10-11 DIAGNOSIS — I480 Paroxysmal atrial fibrillation: Secondary | ICD-10-CM | POA: Diagnosis not present

## 2021-10-11 DIAGNOSIS — I5032 Chronic diastolic (congestive) heart failure: Secondary | ICD-10-CM | POA: Diagnosis not present

## 2021-10-11 DIAGNOSIS — R69 Illness, unspecified: Secondary | ICD-10-CM | POA: Diagnosis not present

## 2021-10-11 DIAGNOSIS — G4733 Obstructive sleep apnea (adult) (pediatric): Secondary | ICD-10-CM | POA: Diagnosis not present

## 2021-10-11 NOTE — Progress Notes (Signed)
Patient ID: Selena Bush, female    DOB: March 13, 1946, 76 y.o.   MRN: [id number]   Selena Bush is a 76 y/o female with a history of breast cancer, hyperlipidemia, HNT, anxiety, atrial fibrillation, parkinson's disorder, DDD, depression, GERD, gout, obstructive sleep apnea and chronic heart failure.   Echo report from 04/29/21 reviewed and showed an EF of 65-70% along with mild LVH, moderately elevated PA pressure, severe LAE and possible PFO.  Was in the ED 06/05/21 due to cough, congestion and shortness of breath. Given IV lasix with improvement in symptoms and Selena Bush was released.    Selena Bush presents today for an urgent visit after waking up and feeling a pressure sensation in Selena Bush upper abdomen and reported SOB. Selena Bush reports the sensation is worse with palpation and inspiration. Currently, Selena Bush denies SOB, CP, radiation of sensation, diaphoresis, palpitations, pedal edema, weight gain, dizziness, cough or orthopnea.   Selena Bush states Selena Bush ate at [facility] yesterday and has not felt well since then.   Weighing daily, no reported weight gain. In office today, 166, was 170 at last appointment last month.   Past Medical History:  Diagnosis Date   Anxiety    Atrial fibrillation ([place])    Cat bite of right hand 03/25/2019   Chronic diastolic CHF (congestive heart failure) ([place])    a. echo 09/2014: EF 18-29%, diastolic dysfunction, mild LVH, nl RV size & systolic function, mildly dilated LA (4.3 cm), mild MR/TR, mildly elevated PASP 36.7 mm Hg   DDD (degenerative disc disease), cervical    DDD (degenerative disc disease), lumbar    Depression    Diffuse cystic mastopathy 2014   Dyspnea    Dysrhythmia    GERD (gastroesophageal reflux disease)    Gout    Headache    rare   HLD (hyperlipidemia)    a. statin intolerant 2/2 myalgias   Hx of dysplastic nevus 12/25/2018   L medial ankle   Hypercholesterolemia    Hypertension    Malignant neoplasm of upper-outer quadrant of female breast ([place]) 10/2012    Papillary DCIS, sentinel node negative. DR/PR positive. PARTIAL RIGHT MASTECTOMY FOR BREAST CANCER--HAD RADIATION - NO CHEMO --DR. CRYSTAL [place] ONCOLOGIST   Obesity    OSA on CPAP    Osteoarthritis of both knees    a. s/p right TKA 04/2013 & left TKA 09/2014   Otitis externa    Parkinson disease [facility])    Personal history of radiation therapy 2015   RIGHT breast-mammosite per pt   Pneumonia of both lungs due to infectious organism 01/08/2021   Sleep difficulties    LUNESTA HAS HELPED   Vaginal cyst     Past Surgical History:  Procedure Laterality Date   ABDOMINAL HYSTERECTOMY  1992   DUB; fibroids; endometriosis.  One remaining ovary.     BREAST LUMPECTOMY Right 2015   Papillary DCIS, sentinel node negative. DR/PR positive. PARTIAL RIGHT MASTECTOMY FOR BREAST CANCER--HAD RADIATION - NO CHEMO --DR. CRYSTAL [place] ONCOLOGIST   BREAST SURGERY Right 10/2012   Wide excision,APB RT 10 mm papillary DCIS, ER/PR positive. Sentinel node negative. Partial breast radiation.   CATARACT EXTRACTION, BILATERAL  02/13/2016   [facility].   CHOLECYSTECTOMY  1994   COLONOSCOPY  2015   1 benign polyp-every 5 years/ Dr Candace Cruise   COLONOSCOPY WITH PROPOFOL N/A 02/10/2021   Procedure: COLONOSCOPY WITH PROPOFOL;  Surgeon: Robert Bellow, MD;  Location: [facility] ENDOSCOPY;  Service: Endoscopy;  Laterality: N/A;   ERCP  1995   JOINT  REPLACEMENT Right 04/2013   knee   PERICARDIOCENTESIS N/A 11/13/2017   Procedure: PERICARDIOCENTESIS;  Surgeon: Nelva Bush, MD;  Location: [address] CV LAB;  Service: Cardiovascular;  Laterality: N/A;   TOTAL KNEE ARTHROPLASTY Right 04/16/2013   Procedure: RIGHT TOTAL KNEE ARTHROPLASTY;  Surgeon: Mauri Pole, MD;  Location: [facility] ORS;  Service: Orthopedics;  Laterality: Right;   TOTAL KNEE ARTHROPLASTY Left 09/15/2014   Procedure: LEFT TOTAL KNEE ARTHROPLASTY;  Surgeon: Mauri Pole, MD;  Location: [facility] ORS;  Service: Orthopedics;  Laterality: Left;   TUBAL LIGATION  1979    UPPER GI ENDOSCOPY     Family History  Problem Relation Age of Onset   Colon cancer Mother    Cancer Mother 20       colon cancer   Melanoma Father    Cancer Father 31       melanoma   Colon cancer Maternal Grandfather    Breast cancer Maternal Grandfather    Melanoma Sister    Melanoma Sister    Social History   Tobacco Use   Smoking status: Never   Smokeless tobacco: Never   Tobacco comments:    social smoker as a teen  Substance Use Topics   Alcohol use: No   Allergies  Allergen Reactions   Penicillins Anaphylaxis    anaphylaxis  Has patient had a PCN reaction causing immediate rash, facial/tongue/throat swelling, SOB or lightheadedness with hypotension: Yes Has patient had a PCN reaction causing severe rash involving mucus membranes or skin necrosis: No Has patient had a PCN reaction that required hospitalization: No Has patient had a PCN reaction occurring within the last 10 years: No If all of the above answers are "NO", then may proceed with Cephalosporin use.    Sulfa Antibiotics Anaphylaxis   Codeine Other (See Comments) and Nausea And Vomiting    Gi problems    Statins Other (See Comments)    Leg pains   Aspirin Other (See Comments)    "burned my stomach intensely" Abdominal pain and burning   Prior to Admission medications   Medication Sig Start Date End Date Taking? Authorizing Provider  acetaminophen (TYLENOL) 500 MG tablet Take by mouth.   Yes [provider]  albuterol (VENTOLIN HFA) 108 (90 Base) MCG/ACT inhaler Inhale 2 puffs into the lungs every 4 (four) hours as needed. 12/07/20  Yes [provider]  carbidopa-levodopa (SINEMET IR) 25-100 MG tablet Take by mouth. Take 2 tablets at 6 AM, 1 at 9AM, 1 tab at 12 PM,  2 at 3 PM, 1 at 6 PM and 1 at 9 PM. 02/12/20  Yes [provider]  diltiazem (CARDIZEM CD) 180 MG 24 hr capsule TAKE ONE TABLET BY MOUTH EVERY MORNING 10/26/20  Yes Lelon Perla, MD  DULoxetine (CYMBALTA)  60 MG capsule TAKE 1 CAPSULE BY MOUTH 2 TIMES A DAY for anxiety and depression. 01/05/21  Yes Pleas Koch, NP  empagliflozin (JARDIANCE) 10 MG TABS tablet Take 1 tablet (10 mg total) by mouth daily before breakfast. 07/13/21  Yes Darylene Price A, FNP  ezetimibe (ZETIA) 10 MG tablet TAKE ONE TABLET BY MOUTH EVERY MORNING 06/23/21  Yes Lelon Perla, MD  fluticasone (FLONASE) 50 MCG/ACT nasal spray Place 2 sprays into both nostrils daily as needed for rhinitis or allergies. 10/06/20  Yes Pleas Koch, NP  furosemide (LASIX) 20 MG tablet Take 1 tablet (20 mg total) by mouth every morning. 06/07/21  Yes Lelon Perla, MD  gabapentin (  NEURONTIN) 100 MG capsule TAKE 1 CAPSULE BY MOUTH THREE TIMES A DAY for anxiety. 11/13/19  Yes Pleas Koch, NP  gabapentin (NEURONTIN) 300 MG capsule Take 600 mg by mouth at bedtime. 01/04/21  Yes [provider]  hydrocortisone 2.5 % cream Apply to affected under breasts 1-2 times a day as directed and as needed for itch. Can also use on face as needed for itch 01/05/21  Yes Brendolyn Patty, MD  ketoconazole (NIZORAL) 2 % cream Apply to affected areas under breast twice daily for rash until healed. Can also use on face as needed for dermatitis 01/05/21  Yes Brendolyn Patty, MD  metoprolol tartrate (LOPRESSOR) 25 MG tablet TAKE THREE TABLETS BY MOUTH TWICE DAILY 06/23/21  Yes Lelon Perla, MD  metroNIDAZOLE (METROCREAM) 0.75 % cream Apply topically 2 (two) times daily. Apply to red areas of face 01/05/21 01/05/22 Yes Brendolyn Patty, MD  potassium chloride (MICRO-K) 10 MEQ CR capsule TAKE ONE CAPSULE BY MOUTH EVERY MORNING 06/23/21  Yes Loel Dubonnet, NP  sotalol (BETAPACE) 120 MG tablet TAKE ONE TABLET BY MOUTH TWICE DAILY 05/26/21  Yes Lelon Perla, MD  tiZANidine (ZANAFLEX) 4 MG tablet TAKE 2 TABLET (4 MG TOTAL) BY MOUTH DAILY FOR MUSCLE SPASMS AT BEDTIME. 06/22/21  Yes Pleas Koch, NP  empagliflozin (JARDIANCE) 10 MG TABS tablet Take  1 tablet (10 mg total) by mouth daily before breakfast. 07/13/21   Alisa Graff, FNP  spironolactone (ALDACTONE) 25 MG tablet Take 1 tablet (25 mg total) by mouth daily. 05/21/21 08/19/21  Lendon Colonel, NP    Review of Systems  Constitutional:  Positive for fatigue. Negative for appetite change.  HENT:  Negative for congestion, postnasal drip and sore throat.   Eyes: Negative.   Respiratory:  Negative for cough, chest tightness and shortness of breath.   Cardiovascular:  Negative for chest pain, palpitations and leg swelling.  Gastrointestinal:  Positive for abdominal pain (upper). Negative for abdominal distention, constipation, diarrhea, nausea and vomiting.  Endocrine: Negative.   Genitourinary: Negative.   Musculoskeletal:  Positive for arthralgias (right knee). Negative for back pain and neck pain.  Skin: Negative.   Allergic/Immunologic: Negative.   Neurological:  Negative for dizziness, light-headedness and headaches.  Hematological:  Negative for adenopathy. Bruises/bleeds easily.  Psychiatric/Behavioral:  Positive for sleep disturbance (wakes frequently throughout the night and not wearing Selena Bush CPAP). Negative for dysphoric mood.    Vitals:   10/11/21 1231  BP: 139/82  Pulse: 63  Resp: 16  SpO2: 95%  Weight: 166 lb 1.6 oz (75.3 kg)  Height: 5\' 2"  (1.575 m)   Wt Readings from Last 3 Encounters:  10/11/21 166 lb 1.6 oz (75.3 kg)  09/06/21 170 lb (77.1 kg)  07/26/21 169 lb 12.8 oz (77 kg)   Lab Results  Component Value Date   CREATININE 1.27 (H) 07/13/2021   CREATININE 1.50 (H) 06/10/2021   CREATININE 1.39 (H) 06/08/2021    Physical Exam Vitals and nursing note reviewed. Exam conducted with a chaperone present (caregiverAbby).  Constitutional:      General: Selena Bush is not in acute distress.    Appearance: Normal appearance. Selena Bush is not toxic-appearing.  HENT:     Head: Normocephalic and atraumatic.  Neck:     Vascular: No JVD.  Cardiovascular:     Rate and  Rhythm: Normal rate and regular rhythm.     Heart sounds: Murmur heard.  Pulmonary:     Effort: Pulmonary effort is normal. No respiratory  distress.     Breath sounds: Normal breath sounds. No wheezing or rales.  Abdominal:     General: There is no distension.     Palpations: Abdomen is soft.     Tenderness: There is abdominal tenderness.  Musculoskeletal:        General: No tenderness.     Cervical back: Normal range of motion and neck supple.     Right lower leg: Edema (ankle) present.     Left lower leg: Edema (ankle) present.  Skin:    General: Skin is warm and dry.     Findings: Bruising (tops of both hands) present.  Neurological:     Mental Status: Selena Bush is alert and oriented to person, place, and time. Mental status is at baseline.  Psychiatric:        Mood and Affect: Mood normal.        Behavior: Behavior normal.        Thought Content: Thought content normal.    Assessment & Plan:  1: Chronic heart failure with preserved ejection fraction with structural changes (LVH/LAE)- - NYHA class II - euvolemic today - weighing daily; reminded to call for an overnight weight gain of > 2 pounds or a weekly weight gain of > 5 pounds - weight down 4 pounds from last visit here  - worried that Selena Bush upper abdomen sensation is r/t HF - saw cardiology Stanford Breed) 07/26/21  - consumed [employer] yesterday and has felt bad since - instructed to take additional dose of lasix today  - on GDMT of SGLT2 - consider Entresto next visit - BNP 06/08/21 was 122.8  2: HTN- - BP 139/82 - saw PCP Carlis Abbott) 06/22/21 - BMP 07/13/21 reviewed and showed sodium 133, K 4.7, creatinine 1.27 & GFR 45  3: Atrial fibrillation- - RR today  4: Sleep apnea- - normally wears CPAP but hadn't been wearing since hospitalization - now feels like Selena Bush's gotten out of the habit of wearing it and admits that Selena Bush needs to try and start wearing it again   Medication list reviewed.   Return in 1 month or sooner for  any questions/problems before then.

## 2021-10-11 NOTE — Patient Instructions (Signed)
Take an extra fluid pill today.  If you continue to have have the sensation in your upper abdomen, follow up with [name].  Call us if you have any issues/concerns.

## 2021-10-12 ENCOUNTER — Other Ambulatory Visit: Payer: Self-pay | Admitting: Family

## 2021-10-12 DIAGNOSIS — I5032 Chronic diastolic (congestive) heart failure: Secondary | ICD-10-CM

## 2021-10-12 DIAGNOSIS — M1712 Unilateral primary osteoarthritis, left knee: Secondary | ICD-10-CM | POA: Diagnosis not present

## 2021-10-12 DIAGNOSIS — G20 Parkinson's disease: Secondary | ICD-10-CM | POA: Diagnosis not present

## 2021-10-12 DIAGNOSIS — I110 Hypertensive heart disease with heart failure: Secondary | ICD-10-CM | POA: Diagnosis not present

## 2021-10-12 DIAGNOSIS — M5136 Other intervertebral disc degeneration, lumbar region: Secondary | ICD-10-CM | POA: Diagnosis not present

## 2021-10-12 DIAGNOSIS — I509 Heart failure, unspecified: Secondary | ICD-10-CM | POA: Diagnosis not present

## 2021-10-12 DIAGNOSIS — M5030 Other cervical disc degeneration, unspecified cervical region: Secondary | ICD-10-CM | POA: Diagnosis not present

## 2021-10-12 DIAGNOSIS — M109 Gout, unspecified: Secondary | ICD-10-CM | POA: Diagnosis not present

## 2021-10-12 DIAGNOSIS — I4891 Unspecified atrial fibrillation: Secondary | ICD-10-CM | POA: Diagnosis not present

## 2021-10-12 DIAGNOSIS — R69 Illness, unspecified: Secondary | ICD-10-CM | POA: Diagnosis not present

## 2021-10-12 MED ORDER — FUROSEMIDE 20 MG PO TABS: 20.0000 mg | ORAL_TABLET | ORAL | 3 refills | Status: DC | PRN

## 2021-10-12 NOTE — Progress Notes (Signed)
Send PRN [facility] [place]

## 2021-10-13 ENCOUNTER — Telehealth: Payer: Self-pay

## 2021-10-13 DIAGNOSIS — R69 Illness, unspecified: Secondary | ICD-10-CM | POA: Diagnosis not present

## 2021-10-13 DIAGNOSIS — F4312 Post-traumatic stress disorder, chronic: Secondary | ICD-10-CM | POA: Diagnosis not present

## 2021-10-13 DIAGNOSIS — F330 Major depressive disorder, recurrent, mild: Secondary | ICD-10-CM | POA: Diagnosis not present

## 2021-10-13 NOTE — Telephone Encounter (Signed)
Returned the patient's caregiver's call asking for an as needed Rx of furosemide to be sent to patient's pharmacy, as patient's medications come in prefilled packets and there are no extras to take as needed for weight gain and swelling. Left message with caregiver that Darylene Price, [place], sent an as needed furosemide prescription with instructions, [facility] [place] [facility] request and patient can return call if there are any further questions or concerns. ?Georg Ruddle, RN ?

## 2021-10-14 DIAGNOSIS — G20 Parkinson's disease: Secondary | ICD-10-CM | POA: Diagnosis not present

## 2021-10-14 DIAGNOSIS — M1712 Unilateral primary osteoarthritis, left knee: Secondary | ICD-10-CM | POA: Diagnosis not present

## 2021-10-14 DIAGNOSIS — I110 Hypertensive heart disease with heart failure: Secondary | ICD-10-CM | POA: Diagnosis not present

## 2021-10-14 DIAGNOSIS — M109 Gout, unspecified: Secondary | ICD-10-CM | POA: Diagnosis not present

## 2021-10-14 DIAGNOSIS — M5030 Other cervical disc degeneration, unspecified cervical region: Secondary | ICD-10-CM | POA: Diagnosis not present

## 2021-10-14 DIAGNOSIS — M5136 Other intervertebral disc degeneration, lumbar region: Secondary | ICD-10-CM | POA: Diagnosis not present

## 2021-10-14 DIAGNOSIS — I509 Heart failure, unspecified: Secondary | ICD-10-CM | POA: Diagnosis not present

## 2021-10-14 DIAGNOSIS — I4891 Unspecified atrial fibrillation: Secondary | ICD-10-CM | POA: Diagnosis not present

## 2021-10-14 DIAGNOSIS — R69 Illness, unspecified: Secondary | ICD-10-CM | POA: Diagnosis not present

## 2021-10-18 ENCOUNTER — Other Ambulatory Visit: Payer: Self-pay | Admitting: Family

## 2021-10-18 DIAGNOSIS — I5032 Chronic diastolic (congestive) heart failure: Secondary | ICD-10-CM

## 2021-10-19 ENCOUNTER — Telehealth: Payer: Self-pay | Admitting: Family

## 2021-10-19 DIAGNOSIS — R262 Difficulty in walking, not elsewhere classified: Secondary | ICD-10-CM | POA: Diagnosis not present

## 2021-10-19 DIAGNOSIS — G20 Parkinson's disease: Secondary | ICD-10-CM | POA: Diagnosis not present

## 2021-10-19 DIAGNOSIS — R251 Tremor, unspecified: Secondary | ICD-10-CM | POA: Diagnosis not present

## 2021-10-19 DIAGNOSIS — G2581 Restless legs syndrome: Secondary | ICD-10-CM | POA: Diagnosis not present

## 2021-10-19 DIAGNOSIS — R69 Illness, unspecified: Secondary | ICD-10-CM | POA: Diagnosis not present

## 2021-10-19 NOTE — Telephone Encounter (Signed)
LVM with patients friend and contact after patient requested furosemide perscription sent into pharmacy for extra as needed. Otila Kluver stated she called in the perscription on 2/28 and that she just needs to call and have it filled. ? ? ?Lucresha Dismuke, NT ?

## 2021-10-20 DIAGNOSIS — I509 Heart failure, unspecified: Secondary | ICD-10-CM | POA: Diagnosis not present

## 2021-10-20 DIAGNOSIS — M109 Gout, unspecified: Secondary | ICD-10-CM | POA: Diagnosis not present

## 2021-10-20 DIAGNOSIS — M1712 Unilateral primary osteoarthritis, left knee: Secondary | ICD-10-CM | POA: Diagnosis not present

## 2021-10-20 DIAGNOSIS — M5030 Other cervical disc degeneration, unspecified cervical region: Secondary | ICD-10-CM | POA: Diagnosis not present

## 2021-10-20 DIAGNOSIS — R69 Illness, unspecified: Secondary | ICD-10-CM | POA: Diagnosis not present

## 2021-10-20 DIAGNOSIS — G20 Parkinson's disease: Secondary | ICD-10-CM | POA: Diagnosis not present

## 2021-10-20 DIAGNOSIS — I4891 Unspecified atrial fibrillation: Secondary | ICD-10-CM | POA: Diagnosis not present

## 2021-10-20 DIAGNOSIS — I110 Hypertensive heart disease with heart failure: Secondary | ICD-10-CM | POA: Diagnosis not present

## 2021-10-20 DIAGNOSIS — M5136 Other intervertebral disc degeneration, lumbar region: Secondary | ICD-10-CM | POA: Diagnosis not present

## 2021-10-21 ENCOUNTER — Other Ambulatory Visit (HOSPITAL_BASED_OUTPATIENT_CLINIC_OR_DEPARTMENT_OTHER): Payer: Self-pay | Admitting: Family

## 2021-10-22 ENCOUNTER — Telehealth: Payer: Self-pay

## 2021-10-22 NOTE — Progress Notes (Signed)
? ? ?Chronic Care Management ?Pharmacy Assistant  ? ?Name: Selena Bush  MRN: [id number] DOB: [birth year] ? ?Reason for Encounter: CCM (Medication Adherence and Delivery Coordination) ?  ?Recent office visits:  ?None since last CCM contact ? ?Recent consult visits:  ?10/20/2021 - [name], PA - Neurology - Telephone - Dr. Melrose Nakayama recommended adding Wellbutrin or Abilify/Risperdal for mood. Waiting for Dr. Stanford Breed to respond.  ?10/19/2021 - Sebastian Ache, MD - Neurology - Patient presented for Start: Sinemet 25/100 mg CR one tab at 9pm dose time.  ?10/11/2021 - Darylene Price, NP - Cardiology - Patient presented for congestive heart failure. Patient having sensation in upper abdomen: take extra fluid pill today. Change: Furosemide 20 mg - PRN above regular daily dose.  ?09/30/2021 - Sebastian Ache, MD - Neurology - Telephone - Start: Oxybutynin 5 mg - Take 1 tab 2 times daily for 30 days.  ?09/24/2021 Whitney Post - Cardiology - Telephone - Stop: Potassium. Have BMET 2 - 3 weeks.  ? ?Hospital visits:  ?None since last CCM contact ? ?Medications: ?Outpatient Encounter Medications as of 10/22/2021  ?Medication Sig  ? acetaminophen (TYLENOL) 500 MG tablet Take by mouth.  ? albuterol (VENTOLIN HFA) 108 (90 Base) MCG/ACT inhaler Inhale 2 puffs into the lungs every 4 (four) hours as needed.  ? carbidopa-levodopa (SINEMET IR) 25-100 MG tablet Take by mouth. Take 2 tablets at 6 AM, 1 at 9AM, 1 tab at 12 PM,  2 at 3 PM, 1 at 6 PM and 1 at 9 PM.  ? diltiazem (CARDIZEM CD) 180 MG 24 hr capsule TAKE ONE CAPSULE BY MOUTH EVERY MORNING  ? DULoxetine (CYMBALTA) 60 MG capsule TAKE ONE CAPSULE BY MOUTH TWICE DAILY FOR anxiety AND depression  ? empagliflozin (JARDIANCE) 10 MG TABS tablet Take 1 tablet (10 mg total) by mouth daily before breakfast.  ? ezetimibe (ZETIA) 10 MG tablet TAKE ONE TABLET BY MOUTH EVERY MORNING  ? fluticasone (FLONASE) 50 MCG/ACT nasal spray Place 2 sprays into both nostrils daily as needed for rhinitis or  allergies.  ? furosemide (LASIX) 20 MG tablet Take 1 tablet (20 mg total) by mouth every morning.  ? furosemide (LASIX) 20 MG tablet Take 1 tablet (20 mg total) by mouth as needed. If needed above your regular daily dose of furosemide  ? gabapentin (NEURONTIN) 100 MG capsule TAKE 1 CAPSULE BY MOUTH THREE TIMES A DAY for anxiety.  ? gabapentin (NEURONTIN) 300 MG capsule Take 600 mg by mouth at bedtime.  ? hydrocortisone 2.5 % cream Apply to affected under breasts 1-2 times a day as directed and as needed for itch. Can also use on face as needed for itch  ? ketoconazole (NIZORAL) 2 % cream Apply to affected areas under breast twice daily for rash until healed. Can also use on face as needed for dermatitis  ? metoprolol tartrate (LOPRESSOR) 25 MG tablet TAKE THREE TABLETS BY MOUTH TWICE DAILY  ? metroNIDAZOLE (METROCREAM) 0.75 % cream Apply topically 2 (two) times daily. Apply to red areas of face  ? sotalol (BETAPACE) 120 MG tablet TAKE ONE TABLET BY MOUTH TWICE DAILY  ? spironolactone (ALDACTONE) 25 MG tablet Take 1 tablet (25 mg total) by mouth daily.  ? tiZANidine (ZANAFLEX) 4 MG tablet TAKE 2 TABLET (4 MG TOTAL) BY MOUTH DAILY FOR MUSCLE SPASMS AT BEDTIME.  ? ?No facility-administered encounter medications on file as of 10/22/2021.  ? ?BP Readings from Last 3 Encounters:  ?10/11/21 139/82  ?09/06/21 128/71  ?07/26/21 134/62  ?  ?  Lab Results  ?Component Value Date  ? HGBA1C 5.8 (A) 06/22/2021  ?  ?Recent OV, Consult or Hospital visit:  ?Recent medication changes indicated:  ? ?Last adherence delivery date: 10/05/2021     ? ?Patient is due for next adherence delivery on: 11/03/2021 ? ?Spoke with patient on 10/22/2021 reviewed medications and coordinated delivery. ? ?This delivery to include: Adherence Packaging  30 Days  ?Packs: ?Duloxetine 60 mg 1 tablet twice a day (1 breakfast, 1 evening meal) ?Ezetimibe '10mg'$  1 tablet daily (breakfast) ?Diltiazem '180mg'$  1 tablet daily (breakfast) ?Metoprolol '25mg'$  3 tablets twice a  day (3 breakfast and 3 evening meal) ?Sotalol '120mg'$  1 tablet twice daily (1 breakfast and 1 evening meal) ?Furosemide 20 mg - 1 tablet daily (breakfast) ?Tizanidine '4mg'$  2 tablets daily (2 bedtime) ?Jardiance 10 mg 1 tablet daily (before breakfast) ?Spironolactone '25mg'$  tablet 1 tablet (breakfast) ?Potassium Chloride 10 MEQ CR Capsule - 1 capsule every morning (1 breakfast) ?Gabapentin 600 mg 1 tablets at bedtime  ?Oxybutynin 5 mg 1 tab at breakfast 1 tab at evening meal ?  ?VIAL medications: w/o safety caps ?Carbidopa-Levodopa 25-'100mg'$  2 Tablets before breakfast 1 tablet at breakfast, 1 tablet at lunch, 1 tablet at evening meal and 2 tablets at bedtime. May take extra tab as need.  ?Gabapentin '100mg'$  1 capsule three times a day (1 breakfast, 1 evening meal, 1 bedtime) May take 1 extra daily, PRN in vial.  ?Furosemide 20 mg - 1 tablet PRN above regular daily dose of Furosemide  ? ?Patient declined the following medications this month: ?Ketoconazole Shampoo 2%-  uses PRN ?Hydrocortisone 2.5% - uses PRN ?Fluticasone Propionate 50 mcg Place 2 sprays into both nostrils daily  ? ?Any concerns about your medications? No ? ?How often do you forget or accidentally miss a dose? Never ? ?Do you use a pillbox? No ? ?Is patient in packaging Yes ? If yes ? What is the date on your next pill pack? 10/22/2021 Breakfast ? Any concerns or issues with your packaging? No ? ?Refills requested from providers include: ?Potassium Chloride 10 MEQ CR Capsule - 1 capsule every morning (1 breakfast) Patient has not had her labs drawn so unsure if [name], NP will approve or not.  ? ?Confirmed delivery date of 11/03/2021, advised patient that pharmacy will contact them the morning of delivery. ? ?Recent blood pressure readings are as follows: Patient does not check at home.  ? ?Recent blood glucose readings are as follows:Patient does not check at home.  ? ?Annual wellness visit in last year? Yes 04/13/2021 ?Most Recent BP reading: 139/82  on 10/11/2021 ? ?Charlene Brooke, CPP notified ? ?[name], RMA ?Clinical Pharmacy Assistant ?[phone] ? ? ?

## 2021-10-27 DIAGNOSIS — G2581 Restless legs syndrome: Secondary | ICD-10-CM | POA: Diagnosis not present

## 2021-10-27 DIAGNOSIS — I509 Heart failure, unspecified: Secondary | ICD-10-CM | POA: Diagnosis not present

## 2021-10-27 DIAGNOSIS — Z7984 Long term (current) use of oral hypoglycemic drugs: Secondary | ICD-10-CM | POA: Diagnosis not present

## 2021-10-27 DIAGNOSIS — K219 Gastro-esophageal reflux disease without esophagitis: Secondary | ICD-10-CM | POA: Diagnosis not present

## 2021-10-27 DIAGNOSIS — E669 Obesity, unspecified: Secondary | ICD-10-CM | POA: Diagnosis not present

## 2021-10-27 DIAGNOSIS — F32A Depression, unspecified: Secondary | ICD-10-CM | POA: Diagnosis not present

## 2021-10-27 DIAGNOSIS — M5136 Other intervertebral disc degeneration, lumbar region: Secondary | ICD-10-CM | POA: Diagnosis not present

## 2021-10-27 DIAGNOSIS — I4891 Unspecified atrial fibrillation: Secondary | ICD-10-CM | POA: Diagnosis not present

## 2021-10-27 DIAGNOSIS — M1712 Unilateral primary osteoarthritis, left knee: Secondary | ICD-10-CM | POA: Diagnosis not present

## 2021-10-27 DIAGNOSIS — Z9181 History of falling: Secondary | ICD-10-CM | POA: Diagnosis not present

## 2021-10-27 DIAGNOSIS — I110 Hypertensive heart disease with heart failure: Secondary | ICD-10-CM | POA: Diagnosis not present

## 2021-10-27 DIAGNOSIS — F419 Anxiety disorder, unspecified: Secondary | ICD-10-CM | POA: Diagnosis not present

## 2021-10-27 DIAGNOSIS — M5030 Other cervical disc degeneration, unspecified cervical region: Secondary | ICD-10-CM | POA: Diagnosis not present

## 2021-10-27 DIAGNOSIS — M109 Gout, unspecified: Secondary | ICD-10-CM | POA: Diagnosis not present

## 2021-10-27 DIAGNOSIS — G20 Parkinson's disease: Secondary | ICD-10-CM | POA: Diagnosis not present

## 2021-10-27 DIAGNOSIS — Z853 Personal history of malignant neoplasm of breast: Secondary | ICD-10-CM | POA: Diagnosis not present

## 2021-10-27 DIAGNOSIS — G4733 Obstructive sleep apnea (adult) (pediatric): Secondary | ICD-10-CM | POA: Diagnosis not present

## 2021-10-28 NOTE — Progress Notes (Addendum)
Called patient to see if she picked up her Sinemet CR from [facility]. Patient picked up medication two days ago for a 30 day supply.  ? ?I spoke with patient and her caregiver. I counseled them on [facility] and their services. I advised them on the services that I offer and that [employer] offer. I gave both my direct number and advised them to call me with any needs they should have.  ? ?[facility] is in need of a refill for Abilify 5 mg as they had to do a partial refill to sync the medication with the patients pack delivery. I called Neurology to request it. I spoke with Deborah Chalk who will forward the message on to Melrose Nakayama, [place].  ? ?Charlene Brooke, CPP notified ? ?[name], RMA ?Clinical Pharmacy Assistant ?[phone] ? ? ?

## 2021-10-28 NOTE — Telephone Encounter (Signed)
Neurology started Sinemet CR and Abilify 5 mg last week. Updated med list. ?

## 2021-11-01 DIAGNOSIS — Z7984 Long term (current) use of oral hypoglycemic drugs: Secondary | ICD-10-CM | POA: Diagnosis not present

## 2021-11-01 DIAGNOSIS — I110 Hypertensive heart disease with heart failure: Secondary | ICD-10-CM | POA: Diagnosis not present

## 2021-11-01 DIAGNOSIS — M109 Gout, unspecified: Secondary | ICD-10-CM | POA: Diagnosis not present

## 2021-11-01 DIAGNOSIS — G2581 Restless legs syndrome: Secondary | ICD-10-CM | POA: Diagnosis not present

## 2021-11-01 DIAGNOSIS — M1712 Unilateral primary osteoarthritis, left knee: Secondary | ICD-10-CM | POA: Diagnosis not present

## 2021-11-01 DIAGNOSIS — Z9181 History of falling: Secondary | ICD-10-CM | POA: Diagnosis not present

## 2021-11-01 DIAGNOSIS — I509 Heart failure, unspecified: Secondary | ICD-10-CM | POA: Diagnosis not present

## 2021-11-01 DIAGNOSIS — I4891 Unspecified atrial fibrillation: Secondary | ICD-10-CM | POA: Diagnosis not present

## 2021-11-01 DIAGNOSIS — G4733 Obstructive sleep apnea (adult) (pediatric): Secondary | ICD-10-CM | POA: Diagnosis not present

## 2021-11-01 DIAGNOSIS — M5136 Other intervertebral disc degeneration, lumbar region: Secondary | ICD-10-CM | POA: Diagnosis not present

## 2021-11-01 DIAGNOSIS — F419 Anxiety disorder, unspecified: Secondary | ICD-10-CM | POA: Diagnosis not present

## 2021-11-01 DIAGNOSIS — E669 Obesity, unspecified: Secondary | ICD-10-CM | POA: Diagnosis not present

## 2021-11-01 DIAGNOSIS — G20 Parkinson's disease: Secondary | ICD-10-CM | POA: Diagnosis not present

## 2021-11-01 DIAGNOSIS — K219 Gastro-esophageal reflux disease without esophagitis: Secondary | ICD-10-CM | POA: Diagnosis not present

## 2021-11-01 DIAGNOSIS — M5030 Other cervical disc degeneration, unspecified cervical region: Secondary | ICD-10-CM | POA: Diagnosis not present

## 2021-11-01 DIAGNOSIS — F32A Depression, unspecified: Secondary | ICD-10-CM | POA: Diagnosis not present

## 2021-11-01 DIAGNOSIS — Z853 Personal history of malignant neoplasm of breast: Secondary | ICD-10-CM | POA: Diagnosis not present

## 2021-11-01 NOTE — Progress Notes (Deleted)
Patient ID: Selena Bush, female    DOB: Aug 06, 1946, 76 y.o.   MRN: [id number]   Ms Joubert is a 76 y/o female with a history of breast cancer, hyperlipidemia, HNT, anxiety, atrial fibrillation, parkinson's disorder, DDD, depression, GERD, gout, obstructive sleep apnea and chronic heart failure.   Echo report from 04/29/21 reviewed and showed an EF of 65-70% along with mild LVH, moderately elevated PA pressure, severe LAE and possible PFO.  Was in the ED 06/05/21 due to cough, congestion and shortness of breath. Given IV lasix with improvement in symptoms and she was released.    She presents today for an urgent visit after waking up and feeling a pressure sensation in her upper abdomen and reported SOB. She reports the sensation is worse with palpation and inspiration. Currently, she denies SOB, CP, radiation of sensation, diaphoresis, palpitations, pedal edema, weight gain, dizziness, cough or orthopnea.   She states she ate at [facility] yesterday and has not felt well since then.   Weighing daily, no reported weight gain. In office today, 166, was 170 at last appointment last month.   Past Medical History:  Diagnosis Date   Anxiety    Atrial fibrillation ([place])    Cat bite of right hand 03/25/2019   Chronic diastolic CHF (congestive heart failure) ([place])    a. echo 09/2014: EF 67-54%, diastolic dysfunction, mild LVH, nl RV size & systolic function, mildly dilated LA (4.3 cm), mild MR/TR, mildly elevated PASP 36.7 mm Hg   DDD (degenerative disc disease), cervical    DDD (degenerative disc disease), lumbar    Depression    Diffuse cystic mastopathy 2014   Dyspnea    Dysrhythmia    GERD (gastroesophageal reflux disease)    Gout    Headache    rare   HLD (hyperlipidemia)    a. statin intolerant 2/2 myalgias   Hx of dysplastic nevus 12/25/2018   L medial ankle   Hypercholesterolemia    Hypertension    Malignant neoplasm of upper-outer quadrant of female breast ([place]) 10/2012    Papillary DCIS, sentinel node negative. DR/PR positive. PARTIAL RIGHT MASTECTOMY FOR BREAST CANCER--HAD RADIATION - NO CHEMO --DR. CRYSTAL [place] ONCOLOGIST   Obesity    OSA on CPAP    Osteoarthritis of both knees    a. s/p right TKA 04/2013 & left TKA 09/2014   Otitis externa    Parkinson disease [facility])    Personal history of radiation therapy 2015   RIGHT breast-mammosite per pt   Pneumonia of both lungs due to infectious organism 01/08/2021   Sleep difficulties    LUNESTA HAS HELPED   Vaginal cyst     Past Surgical History:  Procedure Laterality Date   ABDOMINAL HYSTERECTOMY  1992   DUB; fibroids; endometriosis.  One remaining ovary.     BREAST LUMPECTOMY Right 2015   Papillary DCIS, sentinel node negative. DR/PR positive. PARTIAL RIGHT MASTECTOMY FOR BREAST CANCER--HAD RADIATION - NO CHEMO --DR. CRYSTAL [place] ONCOLOGIST   BREAST SURGERY Right 10/2012   Wide excision,APB RT 10 mm papillary DCIS, ER/PR positive. Sentinel node negative. Partial breast radiation.   CATARACT EXTRACTION, BILATERAL  02/13/2016   [facility].   CHOLECYSTECTOMY  1994   COLONOSCOPY  2015   1 benign polyp-every 5 years/ Dr Candace Cruise   COLONOSCOPY WITH PROPOFOL N/A 02/10/2021   Procedure: COLONOSCOPY WITH PROPOFOL;  Surgeon: Robert Bellow, MD;  Location: [facility] ENDOSCOPY;  Service: Endoscopy;  Laterality: N/A;   ERCP  1995   JOINT  REPLACEMENT Right 04/2013   knee   PERICARDIOCENTESIS N/A 11/13/2017   Procedure: PERICARDIOCENTESIS;  Surgeon: Nelva Bush, MD;  Location: [facility];  Service: Cardiovascular;  Laterality: N/A;   TOTAL KNEE ARTHROPLASTY Right 04/16/2013   Procedure: RIGHT TOTAL KNEE ARTHROPLASTY;  Surgeon: Mauri Pole, MD;  Location: [facility] ORS;  Service: Orthopedics;  Laterality: Right;   TOTAL KNEE ARTHROPLASTY Left 09/15/2014   Procedure: LEFT TOTAL KNEE ARTHROPLASTY;  Surgeon: Mauri Pole, MD;  Location: [facility] ORS;  Service: Orthopedics;  Laterality: Left;   TUBAL LIGATION  1979    UPPER GI ENDOSCOPY     Family History  Problem Relation Age of Onset   Colon cancer Mother    Cancer Mother 60       colon cancer   Melanoma Father    Cancer Father 23       melanoma   Colon cancer Maternal Grandfather    Breast cancer Maternal Grandfather    Melanoma Sister    Melanoma Sister    Social History   Tobacco Use   Smoking status: Never   Smokeless tobacco: Never   Tobacco comments:    social smoker as a teen  Substance Use Topics   Alcohol use: No   Allergies  Allergen Reactions   Penicillins Anaphylaxis    anaphylaxis  Has patient had a PCN reaction causing immediate rash, facial/tongue/throat swelling, SOB or lightheadedness with hypotension: Yes Has patient had a PCN reaction causing severe rash involving mucus membranes or skin necrosis: No Has patient had a PCN reaction that required hospitalization: No Has patient had a PCN reaction occurring within the last 10 years: No If all of the above answers are "NO", then may proceed with Cephalosporin use.    Sulfa Antibiotics Anaphylaxis   Codeine Other (See Comments) and Nausea And Vomiting    Gi problems    Statins Other (See Comments)    Leg pains   Aspirin Other (See Comments)    "burned my stomach intensely" Abdominal pain and burning   Prior to Admission medications   Medication Sig Start Date End Date Taking? Authorizing Provider  acetaminophen (TYLENOL) 500 MG tablet Take by mouth.   Yes [provider]  albuterol (VENTOLIN HFA) 108 (90 Base) MCG/ACT inhaler Inhale 2 puffs into the lungs every 4 (four) hours as needed. 12/07/20  Yes [provider]  carbidopa-levodopa (SINEMET IR) 25-100 MG tablet Take by mouth. Take 2 tablets at 6 AM, 1 at 9AM, 1 tab at 12 PM,  2 at 3 PM, 1 at 6 PM and 1 at 9 PM. 02/12/20  Yes [provider]  diltiazem (CARDIZEM CD) 180 MG 24 hr capsule TAKE ONE TABLET BY MOUTH EVERY MORNING 10/26/20  Yes Lelon Perla, MD  DULoxetine (CYMBALTA)  60 MG capsule TAKE 1 CAPSULE BY MOUTH 2 TIMES A DAY for anxiety and depression. 01/05/21  Yes Pleas Koch, NP  empagliflozin (JARDIANCE) 10 MG TABS tablet Take 1 tablet (10 mg total) by mouth daily before breakfast. 07/13/21  Yes Darylene Price A, FNP  ezetimibe (ZETIA) 10 MG tablet TAKE ONE TABLET BY MOUTH EVERY MORNING 06/23/21  Yes Lelon Perla, MD  fluticasone (FLONASE) 50 MCG/ACT nasal spray Place 2 sprays into both nostrils daily as needed for rhinitis or allergies. 10/06/20  Yes Pleas Koch, NP  furosemide (LASIX) 20 MG tablet Take 1 tablet (20 mg total) by mouth every morning. 06/07/21  Yes Lelon Perla, MD  gabapentin (  NEURONTIN) 100 MG capsule TAKE 1 CAPSULE BY MOUTH THREE TIMES A DAY for anxiety. 11/13/19  Yes Pleas Koch, NP  gabapentin (NEURONTIN) 300 MG capsule Take 600 mg by mouth at bedtime. 01/04/21  Yes [provider]  hydrocortisone 2.5 % cream Apply to affected under breasts 1-2 times a day as directed and as needed for itch. Can also use on face as needed for itch 01/05/21  Yes Brendolyn Patty, MD  ketoconazole (NIZORAL) 2 % cream Apply to affected areas under breast twice daily for rash until healed. Can also use on face as needed for dermatitis 01/05/21  Yes Brendolyn Patty, MD  metoprolol tartrate (LOPRESSOR) 25 MG tablet TAKE THREE TABLETS BY MOUTH TWICE DAILY 06/23/21  Yes Lelon Perla, MD  metroNIDAZOLE (METROCREAM) 0.75 % cream Apply topically 2 (two) times daily. Apply to red areas of face 01/05/21 01/05/22 Yes Brendolyn Patty, MD  potassium chloride (MICRO-K) 10 MEQ CR capsule TAKE ONE CAPSULE BY MOUTH EVERY MORNING 06/23/21  Yes Loel Dubonnet, NP  sotalol (BETAPACE) 120 MG tablet TAKE ONE TABLET BY MOUTH TWICE DAILY 05/26/21  Yes Lelon Perla, MD  tiZANidine (ZANAFLEX) 4 MG tablet TAKE 2 TABLET (4 MG TOTAL) BY MOUTH DAILY FOR MUSCLE SPASMS AT BEDTIME. 06/22/21  Yes Pleas Koch, NP  empagliflozin (JARDIANCE) 10 MG TABS tablet Take  1 tablet (10 mg total) by mouth daily before breakfast. 07/13/21   Alisa Graff, FNP  spironolactone (ALDACTONE) 25 MG tablet Take 1 tablet (25 mg total) by mouth daily. 05/21/21 08/19/21  Lendon Colonel, NP    Review of Systems  Constitutional:  Positive for fatigue. Negative for appetite change.  HENT:  Negative for congestion, postnasal drip and sore throat.   Eyes: Negative.   Respiratory:  Negative for cough, chest tightness and shortness of breath.   Cardiovascular:  Negative for chest pain, palpitations and leg swelling.  Gastrointestinal:  Positive for abdominal pain (upper). Negative for abdominal distention, constipation, diarrhea, nausea and vomiting.  Endocrine: Negative.   Genitourinary: Negative.   Musculoskeletal:  Positive for arthralgias (right knee). Negative for back pain and neck pain.  Skin: Negative.   Allergic/Immunologic: Negative.   Neurological:  Negative for dizziness, light-headedness and headaches.  Hematological:  Negative for adenopathy. Bruises/bleeds easily.  Psychiatric/Behavioral:  Positive for sleep disturbance (wakes frequently throughout the night and not wearing her CPAP). Negative for dysphoric mood.    There were no vitals filed for this visit.  Wt Readings from Last 3 Encounters:  10/11/21 166 lb 1.6 oz (75.3 kg)  09/06/21 170 lb (77.1 kg)  07/26/21 169 lb 12.8 oz (77 kg)   Lab Results  Component Value Date   CREATININE 1.27 (H) 07/13/2021   CREATININE 1.50 (H) 06/10/2021   CREATININE 1.39 (H) 06/08/2021    Physical Exam Vitals and nursing note reviewed. Exam conducted with a chaperone present (caregiverAbby).  Constitutional:      General: She is not in acute distress.    Appearance: Normal appearance. She is not toxic-appearing.  HENT:     Head: Normocephalic and atraumatic.  Neck:     Vascular: No JVD.  Cardiovascular:     Rate and Rhythm: Normal rate and regular rhythm.     Heart sounds: Murmur heard.  Pulmonary:      Effort: Pulmonary effort is normal. No respiratory distress.     Breath sounds: Normal breath sounds. No wheezing or rales.  Abdominal:     General: There is no  distension.     Palpations: Abdomen is soft.     Tenderness: There is abdominal tenderness.  Musculoskeletal:        General: No tenderness.     Cervical back: Normal range of motion and neck supple.     Right lower leg: Edema (ankle) present.     Left lower leg: Edema (ankle) present.  Skin:    General: Skin is warm and dry.     Findings: Bruising (tops of both hands) present.  Neurological:     Mental Status: She is alert and oriented to person, place, and time. Mental status is at baseline.  Psychiatric:        Mood and Affect: Mood normal.        Behavior: Behavior normal.        Thought Content: Thought content normal.    Assessment & Plan:  1: Chronic heart failure with preserved ejection fraction with structural changes (LVH/LAE)- - NYHA class II - euvolemic today - weighing daily; reminded to call for an overnight weight gain of > 2 pounds or a weekly weight gain of > 5 pounds - weight down 4 pounds from last visit here  - worried that her upper abdomen sensation is r/t HF - saw cardiology Stanford Breed) 07/26/21  - consumed [employer] yesterday and has felt bad since - instructed to take additional dose of lasix today  - on GDMT of SGLT2 - consider Entresto next visit - BNP 06/08/21 was 122.8  2: HTN- - BP 139/82 - saw PCP Carlis Abbott) 06/22/21 - BMP 07/13/21 reviewed and showed sodium 133, K 4.7, creatinine 1.27 & GFR 45  3: Atrial fibrillation- - RR today  4: Sleep apnea- - normally wears CPAP but hadn't been wearing since hospitalization - now feels like she's gotten out of the habit of wearing it and admits that she needs to try and start wearing it again   Medication list reviewed.   Return in 1 month or sooner for any questions/problems before then.

## 2021-11-02 ENCOUNTER — Ambulatory Visit: Payer: Medicare HMO | Admitting: Family

## 2021-11-08 ENCOUNTER — Other Ambulatory Visit: Payer: Self-pay | Admitting: General Surgery

## 2021-11-08 DIAGNOSIS — Z86000 Personal history of in-situ neoplasm of breast: Secondary | ICD-10-CM

## 2021-11-10 DIAGNOSIS — Z9181 History of falling: Secondary | ICD-10-CM | POA: Diagnosis not present

## 2021-11-10 DIAGNOSIS — M109 Gout, unspecified: Secondary | ICD-10-CM | POA: Diagnosis not present

## 2021-11-10 DIAGNOSIS — I4891 Unspecified atrial fibrillation: Secondary | ICD-10-CM | POA: Diagnosis not present

## 2021-11-10 DIAGNOSIS — K219 Gastro-esophageal reflux disease without esophagitis: Secondary | ICD-10-CM | POA: Diagnosis not present

## 2021-11-10 DIAGNOSIS — F419 Anxiety disorder, unspecified: Secondary | ICD-10-CM | POA: Diagnosis not present

## 2021-11-10 DIAGNOSIS — G20 Parkinson's disease: Secondary | ICD-10-CM | POA: Diagnosis not present

## 2021-11-10 DIAGNOSIS — G4733 Obstructive sleep apnea (adult) (pediatric): Secondary | ICD-10-CM | POA: Diagnosis not present

## 2021-11-10 DIAGNOSIS — F32A Depression, unspecified: Secondary | ICD-10-CM | POA: Diagnosis not present

## 2021-11-10 DIAGNOSIS — Z853 Personal history of malignant neoplasm of breast: Secondary | ICD-10-CM | POA: Diagnosis not present

## 2021-11-10 DIAGNOSIS — I509 Heart failure, unspecified: Secondary | ICD-10-CM | POA: Diagnosis not present

## 2021-11-10 DIAGNOSIS — G2581 Restless legs syndrome: Secondary | ICD-10-CM | POA: Diagnosis not present

## 2021-11-10 DIAGNOSIS — I110 Hypertensive heart disease with heart failure: Secondary | ICD-10-CM | POA: Diagnosis not present

## 2021-11-10 DIAGNOSIS — Z7984 Long term (current) use of oral hypoglycemic drugs: Secondary | ICD-10-CM | POA: Diagnosis not present

## 2021-11-10 DIAGNOSIS — M1712 Unilateral primary osteoarthritis, left knee: Secondary | ICD-10-CM | POA: Diagnosis not present

## 2021-11-10 DIAGNOSIS — E669 Obesity, unspecified: Secondary | ICD-10-CM | POA: Diagnosis not present

## 2021-11-10 DIAGNOSIS — M5136 Other intervertebral disc degeneration, lumbar region: Secondary | ICD-10-CM | POA: Diagnosis not present

## 2021-11-10 DIAGNOSIS — M5030 Other cervical disc degeneration, unspecified cervical region: Secondary | ICD-10-CM | POA: Diagnosis not present

## 2021-11-12 ENCOUNTER — Ambulatory Visit (INDEPENDENT_AMBULATORY_CARE_PROVIDER_SITE_OTHER): Payer: Medicare HMO | Admitting: Primary Care

## 2021-11-12 ENCOUNTER — Encounter: Payer: Self-pay | Admitting: Primary Care

## 2021-11-12 DIAGNOSIS — F4312 Post-traumatic stress disorder, chronic: Secondary | ICD-10-CM | POA: Diagnosis not present

## 2021-11-12 DIAGNOSIS — F330 Major depressive disorder, recurrent, mild: Secondary | ICD-10-CM | POA: Diagnosis not present

## 2021-11-12 DIAGNOSIS — R109 Unspecified abdominal pain: Secondary | ICD-10-CM | POA: Insufficient documentation

## 2021-11-12 DIAGNOSIS — R69 Illness, unspecified: Secondary | ICD-10-CM | POA: Diagnosis not present

## 2021-11-12 DIAGNOSIS — R1013 Epigastric pain: Secondary | ICD-10-CM

## 2021-11-12 NOTE — Progress Notes (Signed)
? ?Subjective:  ? ? Patient ID: Selena Bush, female    DOB: [birth year], 76 y.o.   MRN: [id number] ? ?HPI ? ?Selena Bush is a very pleasant 76 y.o. female with a history of CHF, hypertension, CAD D, OSA, Parkinson's disease, anxiety depression, prediabetes, GERD, cholecystectomy who presents today to discuss abdominal symptoms. Her friend is with her today. ? ?Over the last month she's noticed burning sensations in her epigastric and left upper abdomen. This occurs within about 10-15 minutes of eating. Her episodes are intermittent, last episode was two weeks ago.  ? ?She's taken Pepto Bismol several times and has found resolve. She denies diarrhea, does tend to have constipation but takes a stool softener with improvement.  ? ?She has cut back on portion sizes and increased her water intake. She denies eating fried/spicy food. She denies esophageal burning, throat fullness, belching, choking, nausea, vomiting, bloody stools. ? ?Her daughter purchased her probiotics for which she has been taking for the last year.  ? ?Wt Readings from Last 3 Encounters:  ?11/12/21 164 lb 6.4 oz (74.6 kg)  ?10/11/21 166 lb 1.6 oz (75.3 kg)  ?09/06/21 170 lb (77.1 kg)  ? ? ? ?BP Readings from Last 3 Encounters:  ?11/12/21 124/76  ?10/11/21 139/82  ?09/06/21 128/71  ? ? ? ? ?Review of Systems  ?Constitutional:  Negative for fever.  ?Cardiovascular:  Negative for chest pain.  ?Gastrointestinal:  Positive for constipation. Negative for abdominal pain, diarrhea, nausea and vomiting.  ? ?   ? ? ?Past Medical History:  ?Diagnosis Date  ? Anxiety   ? Atrial fibrillation ([place])   ? Cat bite of right hand 03/25/2019  ? Chronic diastolic CHF (congestive heart failure) ([place])   ? a. echo 09/2014: EF 54-00%, diastolic dysfunction, mild LVH, nl RV size & systolic function, mildly dilated LA (4.3 cm), mild MR/TR, mildly elevated PASP 36.7 mm Hg  ? DDD (degenerative disc disease), cervical   ? DDD (degenerative disc disease), lumbar   ? Depression    ? Diffuse cystic mastopathy 2014  ? Dyspnea   ? Dysrhythmia   ? GERD (gastroesophageal reflux disease)   ? Gout   ? Headache   ? rare  ? HLD (hyperlipidemia)   ? a. statin intolerant 2/2 myalgias  ? Hx of dysplastic nevus 12/25/2018  ? L medial ankle  ? Hypercholesterolemia   ? Hypertension   ? Malignant neoplasm of upper-outer quadrant of female breast ([place]) 10/2012  ? Papillary DCIS, sentinel node negative. DR/PR positive. PARTIAL RIGHT MASTECTOMY FOR BREAST CANCER--HAD RADIATION - NO CHEMO --DR. Alvia Grove ONCOLOGIST  ? Obesity   ? OSA on CPAP   ? Osteoarthritis of both knees   ? a. s/p right TKA 04/2013 & left TKA 09/2014  ? Otitis externa   ? Parkinson disease ([place])   ? Personal history of radiation therapy 2015  ? RIGHT breast-mammosite per pt  ? Pneumonia of both lungs due to infectious organism 01/08/2021  ? Sleep difficulties   ? LUNESTA HAS HELPED  ? Vaginal cyst   ? ? ?Social History  ? ?Socioeconomic History  ? Marital status: Widowed  ?  Spouse name: Chrissie Noa  ? Number of children: 2  ? Years of education: College  ? Highest education level: Bachelor's degree (e.g., BA, AB, BS)  ?Occupational History  ? Occupation: Retired  ?Tobacco Use  ? Smoking status: Never  ? Smokeless tobacco: Never  ? Tobacco comments:  ?  social smoker as a  teen  ?Vaping Use  ? Vaping Use: Never used  ?Substance and Sexual Activity  ? Alcohol use: No  ? Drug use: No  ? Sexual activity: Not Currently  ?  Birth control/protection: Post-menopausal, Surgical  ?Other Topics Concern  ? Not on file  ?Social History Narrative  ? Marital status: widowed since 09/16/2015  ?    Children: 2 children (45, 40); 5 grandchild (4 in [place]; 1 in [place]).  ?    Lives: alone in townhome; 1 dog, 2 cats  ?    Employment: psychiatric Education officer, museum; retired in 2015  ?    Tobacco: teenager only  ?    Alcohol:  None  ?    Drugs: none  ?    Exercise: walking in 2019; more active in 2019.  Walking daily small amounts.    ?    ADLs: drives;  independent with ADLs; no assistant devices  ?    Advanced Directives: none; FULL CODE; no prolonged measures.   Does not need them.  Daughter/Heather Vista Deck is HCPOA.   ? ?Social Determinants of Health  ? ?Financial Resource Strain: High Risk  ? Difficulty of Paying Living Expenses: Hard  ?Food Insecurity: Not on file  ?Transportation Needs: Not on file  ?Physical Activity: Not on file  ?Stress: Not on file  ?Social Connections: Not on file  ?Intimate Partner Violence: Not on file  ? ? ?Past Surgical History:  ?Procedure Laterality Date  ? ABDOMINAL HYSTERECTOMY  1992  ? DUB; fibroids; endometriosis.  One remaining ovary.    ? BREAST LUMPECTOMY Right 2015  ? Papillary DCIS, sentinel node negative. DR/PR positive. PARTIAL RIGHT MASTECTOMY FOR BREAST CANCER--HAD RADIATION - NO CHEMO --DR. Alvia Grove ONCOLOGIST  ? BREAST SURGERY Right 10/2012  ? Wide excision,APB RT 10 mm papillary DCIS, ER/PR positive. Sentinel node negative. Partial breast radiation.  ? CATARACT EXTRACTION, BILATERAL  02/13/2016  ? [facility].  ? CHOLECYSTECTOMY  1994  ? COLONOSCOPY  2015  ? 1 benign polyp-every 5 years/ Dr Candace Cruise  ? COLONOSCOPY WITH PROPOFOL N/A 02/10/2021  ? Procedure: COLONOSCOPY WITH PROPOFOL;  Surgeon: Robert Bellow, MD;  Location: [facility] ENDOSCOPY;  Service: Endoscopy;  Laterality: N/A;  ? ERCP  1995  ? JOINT REPLACEMENT Right 04/2013  ? knee  ? PERICARDIOCENTESIS N/A 11/13/2017  ? Procedure: PERICARDIOCENTESIS;  Surgeon: Nelva Bush, MD;  Location: [facility];  Service: Cardiovascular;  Laterality: N/A;  ? TOTAL KNEE ARTHROPLASTY Right 04/16/2013  ? Procedure: RIGHT TOTAL KNEE ARTHROPLASTY;  Surgeon: Mauri Pole, MD;  Location: [facility];  Service: Orthopedics;  Laterality: Right;  ? TOTAL KNEE ARTHROPLASTY Left 09/15/2014  ? Procedure: LEFT TOTAL KNEE ARTHROPLASTY;  Surgeon: Mauri Pole, MD;  Location: [facility];  Service: Orthopedics;  Laterality: Left;  ? TUBAL LIGATION  1979  ? UPPER GI ENDOSCOPY     ? ? ?Family History  ?Problem Relation Age of Onset  ? Colon cancer Mother   ? Cancer Mother 36  ?     colon cancer  ? Melanoma Father   ? Cancer Father 39  ?     melanoma  ? Colon cancer Maternal Grandfather   ? Breast cancer Maternal Grandfather   ? Melanoma Sister   ? Melanoma Sister   ? ? ?Allergies  ?Allergen Reactions  ? Penicillins Anaphylaxis  ?  anaphylaxis  ?Has patient had a PCN reaction causing immediate rash, facial/tongue/throat swelling, SOB or lightheadedness with hypotension: Yes ?Has patient had a PCN  reaction causing severe rash involving mucus membranes or skin necrosis: No ?Has patient had a PCN reaction that required hospitalization: No ?Has patient had a PCN reaction occurring within the last 10 years: No ?If all of the above answers are "NO", then may proceed with Cephalosporin use. ?  ? Sulfa Antibiotics Anaphylaxis  ? Codeine Other (See Comments) and Nausea And Vomiting  ?  Gi problems ?  ? Statins Other (See Comments)  ?  Leg pains  ? Aspirin Other (See Comments)  ?  "burned my stomach intensely" ?Abdominal pain and burning  ? ? ?Current Outpatient Medications on File Prior to Visit  ?Medication Sig Dispense Refill  ? acetaminophen (TYLENOL) 500 MG tablet Take by mouth.    ? albuterol (VENTOLIN HFA) 108 (90 Base) MCG/ACT inhaler Inhale 2 puffs into the lungs every 4 (four) hours as needed.    ? ARIPiprazole (ABILIFY) 5 MG tablet Take 5 mg by mouth at bedtime.    ? ARIPiprazole (ABILIFY) 5 MG tablet Take by mouth.    ? carbidopa-levodopa (SINEMET IR) 25-100 MG tablet Take by mouth. Take 2 tablets at 6 AM, 1 at 9AM, 1 tab at 12 PM,  2 at 3 PM, 1 at 6 PM and 1 at 9 PM.    ? Carbidopa-Levodopa ER (SINEMET CR) 25-100 MG tablet controlled release Take 2 tablets by mouth at bedtime.    ? Carbidopa-Levodopa ER (SINEMET CR) 25-100 MG tablet controlled release Take 1 tab at 9pm    ? diltiazem (CARDIZEM CD) 180 MG 24 hr capsule TAKE ONE CAPSULE BY MOUTH EVERY MORNING 90 capsule 2  ? DULoxetine  (CYMBALTA) 60 MG capsule TAKE ONE CAPSULE BY MOUTH TWICE DAILY FOR anxiety AND depression 180 capsule 2  ? empagliflozin (JARDIANCE) 10 MG TABS tablet Take 1 tablet (10 mg total) by mouth daily before breakfast. 30 tablet 5

## 2021-11-12 NOTE — Patient Instructions (Addendum)
Try famotidine (Pepcid) 20 mg daily for stomach symptoms. ? ?You may try yogurt with active stomach cultures.  ? ?Continue drinking plenty of water.  ? ?Please notify me if your symptoms return/progress. ? ?It was a pleasure to see you today! ?, Choking, nausea, vomiting, cholecystectomy ?

## 2021-11-12 NOTE — Assessment & Plan Note (Signed)
Suspect symptoms are related to PUD/GERD, especially as she finds relief with Pepto-Bismol. ? ?Fortunately her symptoms have abated for 2 weeks.  She does not seem concerned about her symptoms today. ? ?Exam today benign. ? ?Discussed famotidine 20 mg daily if needed. ?We also discussed for her to notify me if she develops new symptoms and/or her symptoms return/progress. ? ?Follow-up as needed. ?

## 2021-11-16 ENCOUNTER — Ambulatory Visit: Payer: Medicare HMO | Admitting: Family

## 2021-11-16 NOTE — Progress Notes (Deleted)
? Patient ID: Selena Bush, female    DOB: 1945/10/03, 76 y.o.   MRN: [id number] ? ? ?Ms Mungin is a 76 y/o female with a history of breast cancer, hyperlipidemia, HNT, anxiety, atrial fibrillation, parkinson's disorder, DDD, depression, GERD, gout, obstructive sleep apnea and chronic heart failure.  ? ?Echo report from 04/29/21 reviewed and showed an EF of 65-70% along with mild LVH, moderately elevated PA pressure, severe LAE and possible PFO. ? ?Was in the ED 06/05/21 due to cough, congestion and shortness of breath. Given IV lasix with improvement in symptoms and she was released.   ? ?She presents today for a follow-up visit with a chief complaint of  ? ?Past Medical History:  ?Diagnosis Date  ? Anxiety   ? Atrial fibrillation ([place])   ? Cat bite of right hand 03/25/2019  ? Chronic diastolic CHF (congestive heart failure) ([place])   ? a. echo 09/2014: EF 38-25%, diastolic dysfunction, mild LVH, nl RV size & systolic function, mildly dilated LA (4.3 cm), mild MR/TR, mildly elevated PASP 36.7 mm Hg  ? DDD (degenerative disc disease), cervical   ? DDD (degenerative disc disease), lumbar   ? Depression   ? Diffuse cystic mastopathy 2014  ? Dyspnea   ? Dysrhythmia   ? GERD (gastroesophageal reflux disease)   ? Gout   ? Headache   ? rare  ? HLD (hyperlipidemia)   ? a. statin intolerant 2/2 myalgias  ? Hx of dysplastic nevus 12/25/2018  ? L medial ankle  ? Hypercholesterolemia   ? Hypertension   ? Malignant neoplasm of upper-outer quadrant of female breast ([place]) 10/2012  ? Papillary DCIS, sentinel node negative. DR/PR positive. PARTIAL RIGHT MASTECTOMY FOR BREAST CANCER--HAD RADIATION - NO CHEMO --DR. Alvia Grove ONCOLOGIST  ? Obesity   ? OSA on CPAP   ? Osteoarthritis of both knees   ? a. s/p right TKA 04/2013 & left TKA 09/2014  ? Otitis externa   ? Parkinson disease ([place])   ? Personal history of radiation therapy 2015  ? RIGHT breast-mammosite per pt  ? Pneumonia of both lungs due to infectious organism 01/08/2021  ?  Sleep difficulties   ? LUNESTA HAS HELPED  ? Vaginal cyst   ? ? ?Past Surgical History:  ?Procedure Laterality Date  ? ABDOMINAL HYSTERECTOMY  1992  ? DUB; fibroids; endometriosis.  One remaining ovary.    ? BREAST LUMPECTOMY Right 2015  ? Papillary DCIS, sentinel node negative. DR/PR positive. PARTIAL RIGHT MASTECTOMY FOR BREAST CANCER--HAD RADIATION - NO CHEMO --DR. Alvia Grove ONCOLOGIST  ? BREAST SURGERY Right 10/2012  ? Wide excision,APB RT 10 mm papillary DCIS, ER/PR positive. Sentinel node negative. Partial breast radiation.  ? CATARACT EXTRACTION, BILATERAL  02/13/2016  ? [facility].  ? CHOLECYSTECTOMY  1994  ? COLONOSCOPY  2015  ? 1 benign polyp-every 5 years/ Dr Candace Cruise  ? COLONOSCOPY WITH PROPOFOL N/A 02/10/2021  ? Procedure: COLONOSCOPY WITH PROPOFOL;  Surgeon: Robert Bellow, MD;  Location: [facility] ENDOSCOPY;  Service: Endoscopy;  Laterality: N/A;  ? ERCP  1995  ? JOINT REPLACEMENT Right 04/2013  ? knee  ? PERICARDIOCENTESIS N/A 11/13/2017  ? Procedure: PERICARDIOCENTESIS;  Surgeon: Nelva Bush, MD;  Location: [facility];  Service: Cardiovascular;  Laterality: N/A;  ? TOTAL KNEE ARTHROPLASTY Right 04/16/2013  ? Procedure: RIGHT TOTAL KNEE ARTHROPLASTY;  Surgeon: Mauri Pole, MD;  Location: [facility];  Service: Orthopedics;  Laterality: Right;  ? TOTAL KNEE ARTHROPLASTY Left 09/15/2014  ? Procedure: LEFT  TOTAL KNEE ARTHROPLASTY;  Surgeon: Mauri Pole, MD;  Location: [facility];  Service: Orthopedics;  Laterality: Left;  ? TUBAL LIGATION  1979  ? UPPER GI ENDOSCOPY    ? ?Family History  ?Problem Relation Age of Onset  ? Colon cancer Mother   ? Cancer Mother 68  ?     colon cancer  ? Melanoma Father   ? Cancer Father 54  ?     melanoma  ? Colon cancer Maternal Grandfather   ? Breast cancer Maternal Grandfather   ? Melanoma Sister   ? Melanoma Sister   ? ?Social History  ? ?Tobacco Use  ? Smoking status: Never  ? Smokeless tobacco: Never  ? Tobacco comments:  ?  social smoker as a teen  ?Substance  Use Topics  ? Alcohol use: No  ? ?Allergies  ?Allergen Reactions  ? Penicillins Anaphylaxis  ?  anaphylaxis  ?Has patient had a PCN reaction causing immediate rash, facial/tongue/throat swelling, SOB or lightheadedness with hypotension: Yes ?Has patient had a PCN reaction causing severe rash involving mucus membranes or skin necrosis: No ?Has patient had a PCN reaction that required hospitalization: No ?Has patient had a PCN reaction occurring within the last 10 years: No ?If all of the above answers are "NO", then may proceed with Cephalosporin use. ?  ? Sulfa Antibiotics Anaphylaxis  ? Codeine Other (See Comments) and Nausea And Vomiting  ?  Gi problems ?  ? Statins Other (See Comments)  ?  Leg pains  ? Aspirin Other (See Comments)  ?  "burned my stomach intensely" ?Abdominal pain and burning  ? ? ? ?Review of Systems  ?Constitutional:  Positive for fatigue. Negative for appetite change.  ?HENT:  Negative for congestion, postnasal drip and sore throat.   ?Eyes: Negative.   ?Respiratory:  Negative for cough, chest tightness and shortness of breath.   ?Cardiovascular:  Negative for chest pain, palpitations and leg swelling.  ?Gastrointestinal:  Positive for abdominal pain (upper). Negative for abdominal distention, constipation, diarrhea, nausea and vomiting.  ?Endocrine: Negative.   ?Genitourinary: Negative.   ?Musculoskeletal:  Positive for arthralgias (right knee). Negative for back pain and neck pain.  ?Skin: Negative.   ?Allergic/Immunologic: Negative.   ?Neurological:  Negative for dizziness, light-headedness and headaches.  ?Hematological:  Negative for adenopathy. Bruises/bleeds easily.  ?Psychiatric/Behavioral:  Positive for sleep disturbance (wakes frequently throughout the night and not wearing her CPAP). Negative for dysphoric mood.   ? ? ? ? ?Physical Exam ?Vitals and nursing note reviewed. Exam conducted with a chaperone present (caregiverAbby).  ?Constitutional:   ?   General: She is not in acute  distress. ?   Appearance: Normal appearance. She is not toxic-appearing.  ?HENT:  ?   Head: Normocephalic and atraumatic.  ?Neck:  ?   Vascular: No JVD.  ?Cardiovascular:  ?   Rate and Rhythm: Normal rate and regular rhythm.  ?   Heart sounds: Murmur heard.  ?Pulmonary:  ?   Effort: Pulmonary effort is normal. No respiratory distress.  ?   Breath sounds: Normal breath sounds. No wheezing or rales.  ?Abdominal:  ?   General: There is no distension.  ?   Palpations: Abdomen is soft.  ?   Tenderness: There is abdominal tenderness.  ?Musculoskeletal:     ?   General: No tenderness.  ?   Cervical back: Normal range of motion and neck supple.  ?   Right lower leg: Edema (ankle) present.  ?  Left lower leg: Edema (ankle) present.  ?Skin: ?   General: Skin is warm and dry.  ?   Findings: Bruising (tops of both hands) present.  ?Neurological:  ?   Mental Status: She is alert and oriented to person, place, and time. Mental status is at baseline.  ?Psychiatric:     ?   Mood and Affect: Mood normal.     ?   Behavior: Behavior normal.     ?   Thought Content: Thought content normal.  ?  ?Assessment & Plan: ? ?1: Chronic heart failure with preserved ejection fraction with structural changes (LVH/LAE)- ?- NYHA class II ?- euvolemic today ?- weighing daily; reminded to call for an overnight weight gain of > 2 pounds or a weekly weight gain of > 5 pounds ?- weight down 4 pounds from last visit here  ?- worried that her upper abdomen sensation is r/t HF ?- saw cardiology Stanford Breed) 07/26/21  ?- consumed [employer] yesterday and has felt bad since ?- instructed to take additional dose of lasix today  ?- on GDMT of SGLT2 ?- consider Entresto next visit ?- BNP 06/08/21 was 122.8 ? ?2: HTN- ?- BP  ?- saw PCP Carlis Abbott) 06/22/21 ?- BMP 07/13/21 reviewed and showed sodium 133, K 4.7, creatinine 1.27 & GFR 45 ? ?3: Atrial fibrillation- ?- RR today ? ?4: Sleep apnea- ?- normally wears CPAP but hadn't been wearing since hospitalization ?- now  feels like she's gotten out of the habit of wearing it and admits that she needs to try and start wearing it again ? ? ?Medication list reviewed.  ? ?Return in 1 month or sooner for any questions/problems

## 2021-11-18 DIAGNOSIS — F419 Anxiety disorder, unspecified: Secondary | ICD-10-CM | POA: Diagnosis not present

## 2021-11-18 DIAGNOSIS — M109 Gout, unspecified: Secondary | ICD-10-CM | POA: Diagnosis not present

## 2021-11-18 DIAGNOSIS — K219 Gastro-esophageal reflux disease without esophagitis: Secondary | ICD-10-CM | POA: Diagnosis not present

## 2021-11-18 DIAGNOSIS — I4891 Unspecified atrial fibrillation: Secondary | ICD-10-CM | POA: Diagnosis not present

## 2021-11-18 DIAGNOSIS — M1712 Unilateral primary osteoarthritis, left knee: Secondary | ICD-10-CM | POA: Diagnosis not present

## 2021-11-18 DIAGNOSIS — G20 Parkinson's disease: Secondary | ICD-10-CM | POA: Diagnosis not present

## 2021-11-18 DIAGNOSIS — I110 Hypertensive heart disease with heart failure: Secondary | ICD-10-CM | POA: Diagnosis not present

## 2021-11-18 DIAGNOSIS — I509 Heart failure, unspecified: Secondary | ICD-10-CM | POA: Diagnosis not present

## 2021-11-18 DIAGNOSIS — F32A Depression, unspecified: Secondary | ICD-10-CM | POA: Diagnosis not present

## 2021-11-18 DIAGNOSIS — M5136 Other intervertebral disc degeneration, lumbar region: Secondary | ICD-10-CM | POA: Diagnosis not present

## 2021-11-18 DIAGNOSIS — G2581 Restless legs syndrome: Secondary | ICD-10-CM | POA: Diagnosis not present

## 2021-11-18 DIAGNOSIS — Z853 Personal history of malignant neoplasm of breast: Secondary | ICD-10-CM | POA: Diagnosis not present

## 2021-11-18 DIAGNOSIS — M5030 Other cervical disc degeneration, unspecified cervical region: Secondary | ICD-10-CM | POA: Diagnosis not present

## 2021-11-18 DIAGNOSIS — Z9181 History of falling: Secondary | ICD-10-CM | POA: Diagnosis not present

## 2021-11-18 DIAGNOSIS — E669 Obesity, unspecified: Secondary | ICD-10-CM | POA: Diagnosis not present

## 2021-11-18 DIAGNOSIS — G4733 Obstructive sleep apnea (adult) (pediatric): Secondary | ICD-10-CM | POA: Diagnosis not present

## 2021-11-18 DIAGNOSIS — Z7984 Long term (current) use of oral hypoglycemic drugs: Secondary | ICD-10-CM | POA: Diagnosis not present

## 2021-11-19 ENCOUNTER — Other Ambulatory Visit: Payer: Self-pay | Admitting: Cardiology

## 2021-11-22 ENCOUNTER — Telehealth: Payer: Self-pay

## 2021-11-22 DIAGNOSIS — R29898 Other symptoms and signs involving the musculoskeletal system: Secondary | ICD-10-CM | POA: Diagnosis not present

## 2021-11-22 DIAGNOSIS — W19XXXA Unspecified fall, initial encounter: Secondary | ICD-10-CM | POA: Diagnosis not present

## 2021-11-22 DIAGNOSIS — R262 Difficulty in walking, not elsewhere classified: Secondary | ICD-10-CM | POA: Diagnosis not present

## 2021-11-22 DIAGNOSIS — G2581 Restless legs syndrome: Secondary | ICD-10-CM | POA: Diagnosis not present

## 2021-11-22 DIAGNOSIS — G20 Parkinson's disease: Secondary | ICD-10-CM | POA: Diagnosis not present

## 2021-11-22 NOTE — Progress Notes (Signed)
? ? ?Chronic Care Management ?Pharmacy Assistant  ? ?Name: Selena Bush  MRN: [id number] DOB: January 28, 1946 ? ?Reason for Encounter: CCM (Medication Adherence and Delivery Coordination) ?  ?Recent office visits:  ?11/12/21 [name], NP Epigastric pain: Start: OTC Pepcid 20 mg daily.  ? ?Recent consult visits:  ?11/08/21 Mammogram ordered ? ?Hospital visits:  ?None since last CCM contact ? ?Medications: ?Outpatient Encounter Medications as of 11/22/2021  ?Medication Sig  ? acetaminophen (TYLENOL) 500 MG tablet Take by mouth.  ? albuterol (VENTOLIN HFA) 108 (90 Base) MCG/ACT inhaler Inhale 2 puffs into the lungs every 4 (four) hours as needed.  ? ARIPiprazole (ABILIFY) 5 MG tablet Take 5 mg by mouth at bedtime.  ? ARIPiprazole (ABILIFY) 5 MG tablet Take by mouth.  ? carbidopa-levodopa (SINEMET IR) 25-100 MG tablet Take by mouth. Take 2 tablets at 6 AM, 1 at 9AM, 1 tab at 12 PM,  2 at 3 PM, 1 at 6 PM and 1 at 9 PM.  ? Carbidopa-Levodopa ER (SINEMET CR) 25-100 MG tablet controlled release Take 2 tablets by mouth at bedtime.  ? Carbidopa-Levodopa ER (SINEMET CR) 25-100 MG tablet controlled release Take 1 tab at 9pm  ? diltiazem (CARDIZEM CD) 180 MG 24 hr capsule TAKE ONE CAPSULE BY MOUTH EVERY MORNING  ? DULoxetine (CYMBALTA) 60 MG capsule TAKE ONE CAPSULE BY MOUTH TWICE DAILY FOR anxiety AND depression  ? empagliflozin (JARDIANCE) 10 MG TABS tablet Take 1 tablet (10 mg total) by mouth daily before breakfast.  ? ezetimibe (ZETIA) 10 MG tablet TAKE ONE TABLET BY MOUTH EVERY MORNING  ? fluticasone (FLONASE) 50 MCG/ACT nasal spray Place 2 sprays into both nostrils daily as needed for rhinitis or allergies.  ? furosemide (LASIX) 20 MG tablet Take 1 tablet (20 mg total) by mouth every morning.  ? furosemide (LASIX) 20 MG tablet Take 1 tablet (20 mg total) by mouth as needed. If needed above your regular daily dose of furosemide  ? gabapentin (NEURONTIN) 100 MG capsule TAKE 1 CAPSULE BY MOUTH THREE TIMES A DAY for  anxiety.  ? gabapentin (NEURONTIN) 300 MG capsule Take 600 mg by mouth at bedtime.  ? gabapentin (NEURONTIN) 600 MG tablet Take by mouth.  ? hydrocortisone 2.5 % cream Apply to affected under breasts 1-2 times a day as directed and as needed for itch. Can also use on face as needed for itch  ? ketoconazole (NIZORAL) 2 % cream Apply to affected areas under breast twice daily for rash until healed. Can also use on face as needed for dermatitis  ? metoprolol tartrate (LOPRESSOR) 25 MG tablet TAKE THREE TABLETS BY MOUTH TWICE DAILY  ? metroNIDAZOLE (METROCREAM) 0.75 % cream Apply topically 2 (two) times daily. Apply to red areas of face  ? oxybutynin (DITROPAN) 5 MG tablet Take 5 mg by mouth 2 (two) times daily.  ? sotalol (BETAPACE) 120 MG tablet TAKE ONE TABLET BY MOUTH TWICE DAILY  ? spironolactone (ALDACTONE) 25 MG tablet Take 1 tablet (25 mg total) by mouth daily.  ? tiZANidine (ZANAFLEX) 4 MG tablet TAKE 2 TABLET (4 MG TOTAL) BY MOUTH DAILY FOR MUSCLE SPASMS AT BEDTIME.  ? ?No facility-administered encounter medications on file as of 11/22/2021.  ? ?BP Readings from Last 3 Encounters:  ?11/12/21 124/76  ?10/11/21 139/82  ?09/06/21 128/71  ?  ?Lab Results  ?Component Value Date  ? HGBA1C 5.8 (A) 06/22/2021  ?  ?Recent OV, Consult or Hospital visit:  ?Recent medication changes indicated:  ? Start: OTC  Pepcid 20 mg daily for stomach pain ? ?Last adherence delivery date:  11/03/2021     ? ?Patient is due for next adherence delivery on: 12/02/2021 ? ?Spoke with patient on 11/22/2021 reviewed medications and coordinated delivery. ? ?This delivery to include: Adherence Packaging  30 Days  ?Packs: ?Oxybutynin 5 mg 1 tab at breakfast 1 tab at evening meal ?Sotalol '120mg'$  1 tablet twice daily (1 breakfast and 1 evening meal) ?Tizanidine '4mg'$  2 tablets daily (2 bedtime) ?Ezetimibe '10mg'$  1 tablet daily (breakfast) ?Gabapentin 600 mg 1 tablets at bedtime  ?Spironolactone '25mg'$  tablet 1 tablet (breakfast) ?Jardiance 10 mg 1 tablet  daily (before breakfast) ?Diltiazem '180mg'$  1 tablet daily (breakfast) ?Duloxetine 60 mg 1 tablet twice a day (1 breakfast, 1 evening meal) ?Aripiprazole 5 mg 1 tablet at bedtime ?Metoprolol '25mg'$  3 tablets twice a day (3 breakfast and 3 evening meal) ?Furosemide 20 mg - 1 tablet daily (breakfast) ?  ?VIAL medications: w/o safety caps ?Carbidopa-Levodopa 25-'100mg'$  2 Tablets before breakfast 1 tablet at breakfast, 1 tablet at lunch, 1 tablet at evening meal and 2 tablets at bedtime. May take extra tab as need.  ?Gabapentin '100mg'$  1 capsule three times a day (1 breakfast, 1 evening meal, 1 bedtime) May take 1 extra daily, PRN in vial.  ?Furosemide 20 mg - 1 tablet PRN above regular daily dose of Furosemide  ?Fluticasone Propionate 50 mcg Place 2 sprays into both nostrils daily  ? ?Patient declined the following medications this month: ?Ketoconazole Shampoo 2%-  uses PRN ?Hydrocortisone 2.5% - uses PRN ? ?Declined by provider [name], NP - Cardiology) until labs are drawn: Patient is aware; gave patient phone number and asked her to call to make appt.  ?Potassium Chloride 10 MEQ CR Capsule - 1 capsule every morning (1 breakfast) ? ?Any concerns about your medications? No ? ?How often do you forget or accidentally miss a dose? Rarely ? ?Do you use a pillbox? No ? ?Is patient in packaging Yes ? If yes ? What is the date on your next pill pack? 11/22/2021 evening meal ? Any concerns or issues with your packaging? No concerns ? ?Refills requested from providers include: ?Metoprolol '25mg'$  3 tablets twice a day (3 breakfast and 3 evening meal) ? ?Confirmed delivery date of 12/02/2021, advised patient that pharmacy will contact them the morning of delivery.  ? ?Recent blood pressure readings are as follows: Patient does not check at home.  ?  ?Recent blood glucose readings are as follows: Patient does not check at home.  ?  ?Annual wellness visit in last year? Yes 04/13/2021 ?Most Recent BP reading: 124/76 on 11/12/2021 ?   ?Charlene Brooke, CPP notified ?  ?[name], RMA ?Clinical Pharmacy Assistant ?[phone] ?  ?

## 2021-11-23 ENCOUNTER — Telehealth: Payer: Self-pay

## 2021-11-23 DIAGNOSIS — I110 Hypertensive heart disease with heart failure: Secondary | ICD-10-CM | POA: Diagnosis not present

## 2021-11-23 DIAGNOSIS — I4891 Unspecified atrial fibrillation: Secondary | ICD-10-CM | POA: Diagnosis not present

## 2021-11-23 DIAGNOSIS — M5030 Other cervical disc degeneration, unspecified cervical region: Secondary | ICD-10-CM | POA: Diagnosis not present

## 2021-11-23 DIAGNOSIS — I509 Heart failure, unspecified: Secondary | ICD-10-CM | POA: Diagnosis not present

## 2021-11-23 DIAGNOSIS — R69 Illness, unspecified: Secondary | ICD-10-CM | POA: Diagnosis not present

## 2021-11-23 DIAGNOSIS — G20 Parkinson's disease: Secondary | ICD-10-CM | POA: Diagnosis not present

## 2021-11-23 DIAGNOSIS — M109 Gout, unspecified: Secondary | ICD-10-CM | POA: Diagnosis not present

## 2021-11-23 NOTE — Telephone Encounter (Signed)
Appointment changed with me for tomorrow morning.  Will evaluate patient as scheduled. ?

## 2021-11-23 NOTE — Telephone Encounter (Signed)
[facility] - Client ?TELEPHONE ADVICE RECORD ?AccessNurse? ?Patient ?Name: ?Selena A ?Bush ?Gender: Female ?DOB: [birth year] ?Age: 76 Y 89 M 10 D ?Return ?Phone ?Number: ?[id number] ?(Primary), ?[id number] ?(Secondary) ?Address: ?City/ ?State/ ?Zip: ?[place] ?[address] ?Client [facility] - Client ?Client Site [facility] - Day ?Provider [name] - NP ?Contact Type Call ?Who Is Calling Patient / Member / Family / Caregiver ?Call Type Triage / Clinical ?Relationship To Patient Self ?Return Phone Number [phone] (Primary) ?Chief Complaint Hallucinations ?Reason for Call Symptomatic / Request for Health Information ?Initial Comment Caller states pt is having cognitive issues and UTI ?symptoms. She is having hallucinations. ?Translation No ?Nurse Assessment ?Nurse: Rolin Barry, RN, Levada Dy Date/Time Eilene Ghazi Time): 11/23/2021 11:21:36 AM ?Confirm and document reason for call. If ?symptomatic, describe symptoms. ?---Caller states pt is having cognitive issues and UTI ?symptoms. She is having hallucinations. Caller has ?someone with her. Holley Raring is caregiver. Sx started last ?night. Advised that she started Pepcid 2 weeks ago. ?Does the patient have any new or worsening ?symptoms? ---Yes ?Will a triage be completed? ---Yes ?Related visit to physician within the last 2 weeks? ---Yes ?Does the PT have any chronic conditions? (i.e. ?diabetes, asthma, this includes High risk factors for ?pregnancy, etc.) ?---Yes ?List chronic conditions. ---Parkinson's ?Is this a behavioral health or substance abuse call? ---No ?Guidelines ?Guideline Title Affirmed Question Affirmed Notes Nurse Date/Time (Eastern ?Time) ?Confusion - Delirium Followed a head ?injury ?Rolin Barry, RN, Levada Dy 11/23/2021 11:25:24 ?AM ?Disp. Time (Eastern ?Time) Disposition Final User ?11/23/2021 11:36:15 AM 911 Outcome Documentation Deaton, RN, Levada Dy ?Reason: No answer on call back. ?Caregiver  calling EMS. ?11/23/2021 11:29:01 AM Call EMS 911 Now Yes Deaton, RN, Levada Dy ?PLEASE NOTE: All timestamps contained within this report are represented as [place] Standard Time. ?CONFIDENTIALTY NOTICE: This fax transmission is intended only for the addressee. It contains information that is legally privileged, confidential or ?otherwise protected from use or disclosure. If you are not the intended recipient, you are strictly prohibited from reviewing, disclosing, copying using ?or disseminating any of this information or taking any action in reliance on or regarding this information. If you have received this fax in error, please ?notify us immediately by telephone so that we can arrange for its return to [place]. Phone: [phone], Toll-Free: [phone], Fax: [phone] ?Page: 2 of 2 ?Call Id: [id number] ?Caller Disagree/Comply Comply ?Caller Understands Yes ?PreDisposition Did not know what to do ?Care Advice Given Per Guideline ?CALL EMS 911 NOW: * Immediate medical attention is needed. You need to hang up and call 911 (or an ambulance). * Triager ?Discretion: I'll call you back in a few minutes to be sure you were able to reach them. CARE ADVICE given per Confusion-Delirium ?(Adult) guideline ?

## 2021-11-23 NOTE — Telephone Encounter (Signed)
In access nurse note pt agreed to comply and go to ED. Sending note to Gentry Fitz NP and [facility] CMA. ?

## 2021-11-23 NOTE — Telephone Encounter (Signed)
Patient called office back she refused to go to ED for evaluation. Have reviewed all red words will go If any changes. She has been evaluated by EMS today and was told that she did not go to ED. She also wanted to make sure that we are aware that she was seen at neurology yesterday and told that she was fine. Daughter was on phone as well and received all information. She has ben set up to see Tabitha in the morning.  ?

## 2021-11-24 ENCOUNTER — Encounter: Payer: Self-pay | Admitting: Primary Care

## 2021-11-24 ENCOUNTER — Ambulatory Visit (INDEPENDENT_AMBULATORY_CARE_PROVIDER_SITE_OTHER): Payer: Medicare HMO | Admitting: Primary Care

## 2021-11-24 ENCOUNTER — Ambulatory Visit: Payer: Medicare HMO | Admitting: Family

## 2021-11-24 DIAGNOSIS — M1712 Unilateral primary osteoarthritis, left knee: Secondary | ICD-10-CM | POA: Diagnosis not present

## 2021-11-24 DIAGNOSIS — I509 Heart failure, unspecified: Secondary | ICD-10-CM | POA: Diagnosis not present

## 2021-11-24 DIAGNOSIS — E669 Obesity, unspecified: Secondary | ICD-10-CM | POA: Diagnosis not present

## 2021-11-24 DIAGNOSIS — M5136 Other intervertebral disc degeneration, lumbar region: Secondary | ICD-10-CM | POA: Diagnosis not present

## 2021-11-24 DIAGNOSIS — G20 Parkinson's disease: Secondary | ICD-10-CM | POA: Diagnosis not present

## 2021-11-24 DIAGNOSIS — R410 Disorientation, unspecified: Secondary | ICD-10-CM | POA: Diagnosis not present

## 2021-11-24 DIAGNOSIS — I4891 Unspecified atrial fibrillation: Secondary | ICD-10-CM | POA: Diagnosis not present

## 2021-11-24 DIAGNOSIS — I110 Hypertensive heart disease with heart failure: Secondary | ICD-10-CM | POA: Diagnosis not present

## 2021-11-24 DIAGNOSIS — M109 Gout, unspecified: Secondary | ICD-10-CM | POA: Diagnosis not present

## 2021-11-24 DIAGNOSIS — M5030 Other cervical disc degeneration, unspecified cervical region: Secondary | ICD-10-CM | POA: Diagnosis not present

## 2021-11-24 DIAGNOSIS — K219 Gastro-esophageal reflux disease without esophagitis: Secondary | ICD-10-CM | POA: Diagnosis not present

## 2021-11-24 DIAGNOSIS — R69 Illness, unspecified: Secondary | ICD-10-CM | POA: Diagnosis not present

## 2021-11-24 DIAGNOSIS — R443 Hallucinations, unspecified: Secondary | ICD-10-CM

## 2021-11-24 DIAGNOSIS — Z7984 Long term (current) use of oral hypoglycemic drugs: Secondary | ICD-10-CM | POA: Diagnosis not present

## 2021-11-24 DIAGNOSIS — Z853 Personal history of malignant neoplasm of breast: Secondary | ICD-10-CM | POA: Diagnosis not present

## 2021-11-24 DIAGNOSIS — Z9181 History of falling: Secondary | ICD-10-CM | POA: Diagnosis not present

## 2021-11-24 DIAGNOSIS — G2581 Restless legs syndrome: Secondary | ICD-10-CM | POA: Diagnosis not present

## 2021-11-24 DIAGNOSIS — G4733 Obstructive sleep apnea (adult) (pediatric): Secondary | ICD-10-CM | POA: Diagnosis not present

## 2021-11-24 LAB — BASIC METABOLIC PANEL
BUN: 23 mg/dL (ref 6–23)
CO2: 32 mEq/L (ref 19–32)
Calcium: 9.6 mg/dL (ref 8.4–10.5)
Chloride: 93 mEq/L — ABNORMAL LOW (ref 96–112)
Creatinine, Ser: 1.2 mg/dL (ref 0.40–1.20)
GFR: 44.9 mL/min — ABNORMAL LOW (ref >60.00)
Glucose, Bld: 89 mg/dL (ref 70–99)
Potassium: 4.3 mEq/L (ref 3.5–5.1)
Sodium: 132 mEq/L — ABNORMAL LOW (ref 135–145)

## 2021-11-24 LAB — CBC WITH DIFFERENTIAL/PLATELET
Basophils Absolute: 0.1 10*3/uL (ref 0.0–0.1)
Basophils Relative: 0.7 % (ref 0.0–3.0)
Eosinophils Absolute: 0.1 10*3/uL (ref 0.0–0.7)
Eosinophils Relative: 1.3 % (ref 0.0–5.0)
HCT: 41.7 % (ref 36.0–46.0)
Hemoglobin: 13.4 g/dL (ref 12.0–15.0)
Lymphocytes Relative: 8.2 % — ABNORMAL LOW (ref 12.0–46.0)
Lymphs Abs: 0.8 10*3/uL (ref 0.7–4.0)
MCHC: 32.2 g/dL (ref 30.0–36.0)
MCV: 94.2 fl (ref 78.0–100.0)
Monocytes Absolute: 0.9 10*3/uL (ref 0.1–1.0)
Monocytes Relative: 8.8 % (ref 3.0–12.0)
Neutro Abs: 7.9 10*3/uL — ABNORMAL HIGH (ref 1.4–7.7)
Neutrophils Relative %: 81 % — ABNORMAL HIGH (ref 43.0–77.0)
Platelets: 270 10*3/uL (ref 150.0–400.0)
RBC: 4.43 Mil/uL (ref 3.87–5.11)
RDW: 13.8 % (ref 11.5–15.5)
WBC: 9.7 10*3/uL (ref 4.0–10.5)

## 2021-11-24 LAB — POC URINALSYSI DIPSTICK (AUTOMATED)
Bilirubin, UA: NEGATIVE
Blood, UA: NEGATIVE
Glucose, UA: POSITIVE — AB
Ketones, UA: NEGATIVE
Nitrite, UA: NEGATIVE
Protein, UA: NEGATIVE
Spec Grav, UA: 1.015 (ref 1.010–1.025)
Urobilinogen, UA: 0.2 E.U./dL
pH, UA: 6 (ref 5.0–8.0)

## 2021-11-24 LAB — VITAMIN B12: Vitamin B-12: 194 pg/mL — ABNORMAL LOW (ref 211–911)

## 2021-11-24 NOTE — Patient Instructions (Signed)
Continue to drink plenty of water. ? ?I will be in touch Friday this week with final results.  ? ?It was a pleasure to see you today! ? ? ?

## 2021-11-24 NOTE — Progress Notes (Signed)
? ?Subjective:  ? ? Patient ID: Selena Bush, female    DOB: 11-Apr-1946, 76 y.o.   MRN: [id number] ? ?HPI ? ?Selena Bush is a very pleasant 76 y.o. female with a history of hypertension, CHF, Parkinson's disease, osteoarthritis, breast cancer, atrial fibrillation, depression, anxiety, prediabetes who presents today to discuss hallucinations.  She is accompanied by her caregiver who is assisting in HPI. ? ?She called our office yesterday endorsing cognitive issues, hallucinations, and UTI symptoms that began the evening prior. Given these symptoms she was instructed to come in to the office for treatment.  ? ?Today her caregiver endorses noticing odd behavior. The patient saw a dog vomiting that came from her cat's litter box. There was never a dog. The prior evening she saw someone outside of her home with a flashlight. Her caregiver looked around and did not see anyone. Her caregiver thinks she may be seeing shadows of her tea pots thinking that they are people or other objects.  ? ?The patient endorses seeing a man, women, and child standing in her home yesterday during the day, they were shining a light on the refrigerator.  ? ?She fell yesterday morning, second fall within two weeks, she hit her head each time. Paramedics came by for each incident, patient was coherent each time. Exam was not concerning, no ED evaluation was recommended. She denies headaches, increased dizziness.  ? ?Her caregiver and daughter are concerned she may have a UTI. She denies dysuria, urinary frequency, gross hematuria. She does wear a depends for history of incontinence. Her caregiver has noticed an intermittent foul odor from her urine, especially just after she urinates.  ? ?Following with neurology for her progressing Parkinson's Disease. Last visit was 4 weeks ago, Sinemet was changed to CR which has helped a lot with her Parkinson's symptoms, and Abilify was added to her regimen for anxiety at night which has helped. No  other medication changes. ? ?BP Readings from Last 3 Encounters:  ?11/24/21 124/72  ?11/12/21 124/76  ?10/11/21 139/82  ? ? ? ? ?Review of Systems  ?Genitourinary:  Negative for dysuria, frequency, hematuria and urgency.  ?Neurological:  Negative for dizziness and headaches.  ?Psychiatric/Behavioral:  Positive for hallucinations. The patient is not nervous/anxious.   ? ?   ? ? ?Past Medical History:  ?Diagnosis Date  ? Anxiety   ? Atrial fibrillation ([place])   ? Cat bite of right hand 03/25/2019  ? Chronic diastolic CHF (congestive heart failure) ([place])   ? a. echo 09/2014: EF 01-75%, diastolic dysfunction, mild LVH, nl RV size & systolic function, mildly dilated LA (4.3 cm), mild MR/TR, mildly elevated PASP 36.7 mm Hg  ? DDD (degenerative disc disease), cervical   ? DDD (degenerative disc disease), lumbar   ? Depression   ? Diffuse cystic mastopathy 2014  ? Dyspnea   ? Dysrhythmia   ? GERD (gastroesophageal reflux disease)   ? Gout   ? Headache   ? rare  ? HLD (hyperlipidemia)   ? a. statin intolerant 2/2 myalgias  ? Hx of dysplastic nevus 12/25/2018  ? L medial ankle  ? Hypercholesterolemia   ? Hypertension   ? Malignant neoplasm of upper-outer quadrant of female breast ([place]) 10/2012  ? Papillary DCIS, sentinel node negative. DR/PR positive. PARTIAL RIGHT MASTECTOMY FOR BREAST CANCER--HAD RADIATION - NO CHEMO --DR. Alvia Grove ONCOLOGIST  ? Obesity   ? OSA on CPAP   ? Osteoarthritis of both knees   ? a. s/p right TKA  04/2013 & left TKA 09/2014  ? Otitis externa   ? Parkinson disease ([place])   ? Personal history of radiation therapy 2015  ? RIGHT breast-mammosite per pt  ? Pneumonia of both lungs due to infectious organism 01/08/2021  ? Sleep difficulties   ? LUNESTA HAS HELPED  ? Vaginal cyst   ? ? ?Social History  ? ?Socioeconomic History  ? Marital status: Widowed  ?  Spouse name: Chrissie Noa  ? Number of children: 2  ? Years of education: College  ? Highest education level: Bachelor's degree (e.g., BA, AB, BS)   ?Occupational History  ? Occupation: Retired  ?Tobacco Use  ? Smoking status: Never  ? Smokeless tobacco: Never  ? Tobacco comments:  ?  social smoker as a teen  ?Vaping Use  ? Vaping Use: Never used  ?Substance and Sexual Activity  ? Alcohol use: No  ? Drug use: No  ? Sexual activity: Not Currently  ?  Birth control/protection: Post-menopausal, Surgical  ?Other Topics Concern  ? Not on file  ?Social History Narrative  ? Marital status: widowed since 09/16/2015  ?    Children: 2 children (45, 40); 5 grandchild (4 in [place]; 1 in [place]).  ?    Lives: alone in townhome; 1 dog, 2 cats  ?    Employment: psychiatric Education officer, museum; retired in 2015  ?    Tobacco: teenager only  ?    Alcohol:  None  ?    Drugs: none  ?    Exercise: walking in 2019; more active in 2019.  Walking daily small amounts.    ?    ADLs: drives; independent with ADLs; no assistant devices  ?    Advanced Directives: none; FULL CODE; no prolonged measures.   Does not need them.  Daughter/Heather Vista Deck is HCPOA.   ? ?Social Determinants of Health  ? ?Financial Resource Strain: High Risk  ? Difficulty of Paying Living Expenses: Hard  ?Food Insecurity: Not on file  ?Transportation Needs: Not on file  ?Physical Activity: Not on file  ?Stress: Not on file  ?Social Connections: Not on file  ?Intimate Partner Violence: Not on file  ? ? ?Past Surgical History:  ?Procedure Laterality Date  ? ABDOMINAL HYSTERECTOMY  1992  ? DUB; fibroids; endometriosis.  One remaining ovary.    ? BREAST LUMPECTOMY Right 2015  ? Papillary DCIS, sentinel node negative. DR/PR positive. PARTIAL RIGHT MASTECTOMY FOR BREAST CANCER--HAD RADIATION - NO CHEMO --DR. Alvia Grove ONCOLOGIST  ? BREAST SURGERY Right 10/2012  ? Wide excision,APB RT 10 mm papillary DCIS, ER/PR positive. Sentinel node negative. Partial breast radiation.  ? CATARACT EXTRACTION, BILATERAL  02/13/2016  ? [facility].  ? CHOLECYSTECTOMY  1994  ? COLONOSCOPY  2015  ? 1 benign polyp-every 5 years/ Dr Candace Cruise  ?  COLONOSCOPY WITH PROPOFOL N/A 02/10/2021  ? Procedure: COLONOSCOPY WITH PROPOFOL;  Surgeon: Robert Bellow, MD;  Location: [facility] ENDOSCOPY;  Service: Endoscopy;  Laterality: N/A;  ? ERCP  1995  ? JOINT REPLACEMENT Right 04/2013  ? knee  ? PERICARDIOCENTESIS N/A 11/13/2017  ? Procedure: PERICARDIOCENTESIS;  Surgeon: Nelva Bush, MD;  Location: [facility] CV LAB;  Service: Cardiovascular;  Laterality: N/A;  ? TOTAL KNEE ARTHROPLASTY Right 04/16/2013  ? Procedure: RIGHT TOTAL KNEE ARTHROPLASTY;  Surgeon: Mauri Pole, MD;  Location: [facility] ORS;  Service: Orthopedics;  Laterality: Right;  ? TOTAL KNEE ARTHROPLASTY Left 09/15/2014  ? Procedure: LEFT TOTAL KNEE ARTHROPLASTY;  Surgeon: Mauri Pole, MD;  Location:  [facility] ORS;  Service: Orthopedics;  Laterality: Left;  ? TUBAL LIGATION  1979  ? UPPER GI ENDOSCOPY    ? ? ?Family History  ?Problem Relation Age of Onset  ? Colon cancer Mother   ? Cancer Mother 77  ?     colon cancer  ? Melanoma Father   ? Cancer Father 49  ?     melanoma  ? Colon cancer Maternal Grandfather   ? Breast cancer Maternal Grandfather   ? Melanoma Sister   ? Melanoma Sister   ? ? ?Allergies  ?Allergen Reactions  ? Penicillins Anaphylaxis  ?  anaphylaxis  ?Has patient had a PCN reaction causing immediate rash, facial/tongue/throat swelling, SOB or lightheadedness with hypotension: Yes ?Has patient had a PCN reaction causing severe rash involving mucus membranes or skin necrosis: No ?Has patient had a PCN reaction that required hospitalization: No ?Has patient had a PCN reaction occurring within the last 10 years: No ?If all of the above answers are "NO", then may proceed with Cephalosporin use. ?  ? Sulfa Antibiotics Anaphylaxis  ? Codeine Other (See Comments) and Nausea And Vomiting  ?  Gi problems ?  ? Statins Other (See Comments)  ?  Leg pains  ? Aspirin Other (See Comments)  ?  "burned my stomach intensely" ?Abdominal pain and burning  ? ? ?Current Outpatient Medications on File Prior to  Visit  ?Medication Sig Dispense Refill  ? acetaminophen (TYLENOL) 500 MG tablet Take by mouth.    ? ARIPiprazole (ABILIFY) 5 MG tablet Take 5 mg by mouth at bedtime.    ? carbidopa-levodopa (SINEMET IR) 25-100 MG tablet

## 2021-11-24 NOTE — Assessment & Plan Note (Addendum)
Acute and not typical for patient. ?Exam today grossly negative. ? ?Differentials include UTI, medication changes per neurology last month. ? ?UA today with 3+ leuks, trace glucose.  ?Culture sent.  ? ?Checking CBC, B12, and BMP today. ? ?Await results.  ?

## 2021-11-25 ENCOUNTER — Ambulatory Visit: Payer: Medicare HMO | Attending: Family | Admitting: Family

## 2021-11-25 ENCOUNTER — Encounter: Payer: Self-pay | Admitting: Family

## 2021-11-25 VITALS — BP 124/76 | HR 51 | Resp 18 | Ht 62.0 in | Wt 159.0 lb

## 2021-11-25 DIAGNOSIS — I10 Essential (primary) hypertension: Secondary | ICD-10-CM

## 2021-11-25 DIAGNOSIS — M109 Gout, unspecified: Secondary | ICD-10-CM | POA: Diagnosis not present

## 2021-11-25 DIAGNOSIS — I110 Hypertensive heart disease with heart failure: Secondary | ICD-10-CM | POA: Insufficient documentation

## 2021-11-25 DIAGNOSIS — I5032 Chronic diastolic (congestive) heart failure: Secondary | ICD-10-CM | POA: Diagnosis not present

## 2021-11-25 DIAGNOSIS — Z853 Personal history of malignant neoplasm of breast: Secondary | ICD-10-CM | POA: Diagnosis not present

## 2021-11-25 DIAGNOSIS — G20 Parkinson's disease: Secondary | ICD-10-CM | POA: Diagnosis not present

## 2021-11-25 DIAGNOSIS — R443 Hallucinations, unspecified: Secondary | ICD-10-CM | POA: Insufficient documentation

## 2021-11-25 DIAGNOSIS — F32A Depression, unspecified: Secondary | ICD-10-CM | POA: Diagnosis not present

## 2021-11-25 DIAGNOSIS — M5136 Other intervertebral disc degeneration, lumbar region: Secondary | ICD-10-CM | POA: Diagnosis not present

## 2021-11-25 DIAGNOSIS — K219 Gastro-esophageal reflux disease without esophagitis: Secondary | ICD-10-CM | POA: Insufficient documentation

## 2021-11-25 DIAGNOSIS — I480 Paroxysmal atrial fibrillation: Secondary | ICD-10-CM | POA: Diagnosis not present

## 2021-11-25 DIAGNOSIS — I4891 Unspecified atrial fibrillation: Secondary | ICD-10-CM | POA: Diagnosis not present

## 2021-11-25 DIAGNOSIS — F419 Anxiety disorder, unspecified: Secondary | ICD-10-CM | POA: Diagnosis not present

## 2021-11-25 DIAGNOSIS — E785 Hyperlipidemia, unspecified: Secondary | ICD-10-CM | POA: Insufficient documentation

## 2021-11-25 DIAGNOSIS — R69 Illness, unspecified: Secondary | ICD-10-CM | POA: Diagnosis not present

## 2021-11-25 DIAGNOSIS — M5030 Other cervical disc degeneration, unspecified cervical region: Secondary | ICD-10-CM | POA: Insufficient documentation

## 2021-11-25 DIAGNOSIS — Z79899 Other long term (current) drug therapy: Secondary | ICD-10-CM | POA: Insufficient documentation

## 2021-11-25 DIAGNOSIS — G4733 Obstructive sleep apnea (adult) (pediatric): Secondary | ICD-10-CM | POA: Diagnosis not present

## 2021-11-25 LAB — URINE CULTURE
MICRO NUMBER:: 13254189
SPECIMEN QUALITY:: ADEQUATE

## 2021-11-25 MED ORDER — CARBIDOPA-LEVODOPA 25-100 MG PO TABS: ORAL_TABLET | ORAL | Status: DC

## 2021-11-25 NOTE — Patient Instructions (Signed)
Continue weighing daily and call for an overnight weight gain of 3 pounds or more or a weekly weight gain of more than 5 pounds.   If you have voicemail, please make sure your mailbox is cleaned out so that we may leave a message and please make sure to listen to any voicemails.     

## 2021-11-25 NOTE — Progress Notes (Signed)
? Patient ID: Selena Bush, female    DOB: [birth year], 76 y.o.   MRN: [id number] ? ? ?Selena Bush is a 76 y/o female with a history of breast cancer, hyperlipidemia, HNT, anxiety, atrial fibrillation, parkinson's disorder, DDD, depression, GERD, gout, obstructive sleep apnea and chronic heart failure.  ? ?Echo report from 04/29/21 reviewed and showed an EF of 65-70% along with mild LVH, moderately elevated PA pressure, severe LAE and possible PFO. ? ?Was in the ED 06/05/21 due to cough, congestion and shortness of breath. Given IV lasix with improvement in symptoms and she was released.   ? ?She presents today for a follow-up visit with a chief complaint of minimal fatigue upon moderate exertion. She describes this as chronic in nature. She has associated cough, easy bruising, difficulty sleeping and abrasion on left elbow along with this. She denies any dizziness, abdominal distention, palpitations, pedal edema, chest pain, shortness of breath or weight gain.  ? ?Was started on abilify ~ 4-5 weeks ago. Starting having visual hallucinations ~ 2 weeks ago and has fallen 3 times in the last few weeks with the most recent fall being today. She denies being dizzy prior to falling and says that each time, she stepped on something that caused her to be off-balance. She recently saw her PCP who did a urine culture to see if she has a UTI causing hallucinations.  ? ?Was eating out frequently recently and ate [place] food and fried chicken which resulted in swollen ankles. She took an extra furosemide for a few days and the swelling resolved. Last took an extra dose 2 days ago.  ? ?Past Medical History:  ?Diagnosis Date  ? Anxiety   ? Atrial fibrillation ([place])   ? Cat bite of right hand 03/25/2019  ? Chronic diastolic CHF (congestive heart failure) ([place])   ? a. echo 09/2014: EF 00-93%, diastolic dysfunction, mild LVH, nl RV size & systolic function, mildly dilated LA (4.3 cm), mild MR/TR, mildly elevated PASP 36.7 mm Hg  ? DDD  (degenerative disc disease), cervical   ? DDD (degenerative disc disease), lumbar   ? Depression   ? Diffuse cystic mastopathy 2014  ? Dyspnea   ? Dysrhythmia   ? GERD (gastroesophageal reflux disease)   ? Gout   ? Headache   ? rare  ? HLD (hyperlipidemia)   ? a. statin intolerant 2/2 myalgias  ? Hx of dysplastic nevus 12/25/2018  ? L medial ankle  ? Hypercholesterolemia   ? Hypertension   ? Malignant neoplasm of upper-outer quadrant of female breast ([place]) 10/2012  ? Papillary DCIS, sentinel node negative. DR/PR positive. PARTIAL RIGHT MASTECTOMY FOR BREAST CANCER--HAD RADIATION - NO CHEMO --DR. Alvia Grove ONCOLOGIST  ? Obesity   ? OSA on CPAP   ? Osteoarthritis of both knees   ? a. s/p right TKA 04/2013 & left TKA 09/2014  ? Otitis externa   ? Parkinson disease ([place])   ? Personal history of radiation therapy 2015  ? RIGHT breast-mammosite per pt  ? Pneumonia of both lungs due to infectious organism 01/08/2021  ? Sleep difficulties   ? LUNESTA HAS HELPED  ? Vaginal cyst   ? ? ?Past Surgical History:  ?Procedure Laterality Date  ? ABDOMINAL HYSTERECTOMY  1992  ? DUB; fibroids; endometriosis.  One remaining ovary.    ? BREAST LUMPECTOMY Right 2015  ? Papillary DCIS, sentinel node negative. DR/PR positive. PARTIAL RIGHT MASTECTOMY FOR BREAST CANCER--HAD RADIATION - NO CHEMO --DR. Alvia Grove ONCOLOGIST  ?  BREAST SURGERY Right 10/2012  ? Wide excision,APB RT 10 mm papillary DCIS, ER/PR positive. Sentinel node negative. Partial breast radiation.  ? CATARACT EXTRACTION, BILATERAL  02/13/2016  ? [facility].  ? CHOLECYSTECTOMY  1994  ? COLONOSCOPY  2015  ? 1 benign polyp-every 5 years/ Dr Candace Cruise  ? COLONOSCOPY WITH PROPOFOL N/A 02/10/2021  ? Procedure: COLONOSCOPY WITH PROPOFOL;  Surgeon: Robert Bellow, MD;  Location[facility] ENDOSCOPY;  Service: Endoscopy;  Laterality: N/A;  ? ERCP  1995  ? JOINT REPLACEMENT Right 04/2013  ? knee  ? PERICARDIOCENTESIS N/A 11/13/2017  ? Procedure: PERICARDIOCENTESIS;  Surgeon: Nelva Bush, MD;  Location: [facility] CV LAB;  Service: Cardiovascular;  Laterality: N/A;  ? TOTAL KNEE ARTHROPLASTY Right 04/16/2013  ? Procedure: RIGHT TOTAL KNEE ARTHROPLASTY;  Surgeon: Mauri Pole, MD;  Location: [facility] ORS;  Service: Orthopedics;  Laterality: Right;  ? TOTAL KNEE ARTHROPLASTY Left 09/15/2014  ? Procedure: LEFT TOTAL KNEE ARTHROPLASTY;  Surgeon: Mauri Pole, MD;  Location: [facility] ORS;  Service: Orthopedics;  Laterality: Left;  ? TUBAL LIGATION  1979  ? UPPER GI ENDOSCOPY    ? ?Family History  ?Problem Relation Age of Onset  ? Colon cancer Mother   ? Cancer Mother 43  ?     colon cancer  ? Melanoma Father   ? Cancer Father 20  ?     melanoma  ? Colon cancer Maternal Grandfather   ? Breast cancer Maternal Grandfather   ? Melanoma Sister   ? Melanoma Sister   ? ?Social History  ? ?Tobacco Use  ? Smoking status: Never  ? Smokeless tobacco: Never  ? Tobacco comments:  ?  social smoker as a teen  ?Substance Use Topics  ? Alcohol use: No  ? ?Allergies  ?Allergen Reactions  ? Penicillins Anaphylaxis  ?  anaphylaxis  ?Has patient had a PCN reaction causing immediate rash, facial/tongue/throat swelling, SOB or lightheadedness with hypotension: Yes ?Has patient had a PCN reaction causing severe rash involving mucus membranes or skin necrosis: No ?Has patient had a PCN reaction that required hospitalization: No ?Has patient had a PCN reaction occurring within the last 10 years: No ?If all of the above answers are "NO", then may proceed with Cephalosporin use. ?  ? Sulfa Antibiotics Anaphylaxis  ? Codeine Other (See Comments) and Nausea And Vomiting  ?  Gi problems ?  ? Statins Other (See Comments)  ?  Leg pains  ? Aspirin Other (See Comments)  ?  "burned my stomach intensely" ?Abdominal pain and burning  ? ?Prior to Admission medications   ?Medication Sig Start Date End Date Taking? Authorizing Provider  ?acetaminophen (TYLENOL) 500 MG tablet Take by mouth.   Yes [provider]  ?ARIPiprazole  (ABILIFY) 5 MG tablet Take 5 mg by mouth at bedtime.   Yes [provider]  ?Carbidopa-Levodopa ER (SINEMET CR) 25-100 MG tablet controlled release Take 1 tab at 9pm 10/19/21  Yes [provider]  ?diltiazem (CARDIZEM CD) 180 MG 24 hr capsule TAKE ONE CAPSULE BY MOUTH EVERY MORNING 07/21/21  Yes Lelon Perla, MD  ?DULoxetine (CYMBALTA) 60 MG capsule TAKE ONE CAPSULE BY MOUTH TWICE DAILY FOR anxiety AND depression 07/21/21  Yes Pleas Koch, NP  ?empagliflozin (JARDIANCE) 10 MG TABS tablet Take 1 tablet (10 mg total) by mouth daily before breakfast. 07/13/21  Yes Darylene Price A, FNP  ?ezetimibe (ZETIA) 10 MG tablet TAKE ONE TABLET BY MOUTH EVERY MORNING 06/23/21  Yes [name]  S, MD  ?fluticasone (FLONASE) 50 MCG/ACT nasal spray Place 2 sprays into both nostrils daily as needed for rhinitis or allergies. 07/21/21  Yes Pleas Koch, NP  ?furosemide (LASIX) 20 MG tablet Take 1 tablet (20 mg total) by mouth every morning. 06/07/21  Yes Lelon Perla, MD  ?furosemide (LASIX) 20 MG tablet Take 1 tablet (20 mg total) by mouth as needed. If needed above your regular daily dose of furosemide 10/12/21 01/10/22 Yes Lyriq Jarchow A, FNP  ?gabapentin (NEURONTIN) 100 MG capsule TAKE 1 CAPSULE BY MOUTH THREE TIMES A DAY for anxiety. 11/13/19  Yes Pleas Koch, NP  ?hydrocortisone 2.5 % cream Apply to affected under breasts 1-2 times a day as directed and as needed for itch. Can also use on face as needed for itch 01/05/21  Yes Brendolyn Patty, MD  ?ketoconazole (NIZORAL) 2 % cream Apply to affected areas under breast twice daily for rash until healed. Can also use on face as needed for dermatitis 01/05/21  Yes Brendolyn Patty, MD  ?metoprolol tartrate (LOPRESSOR) 25 MG tablet TAKE THREE TABLETS BY MOUTH TWICE DAILY 11/19/21  Yes Lelon Perla, MD  ?metroNIDAZOLE (METROCREAM) 0.75 % cream Apply topically 2 (two) times daily. Apply to red areas of face 01/05/21 01/05/22 Yes Brendolyn Patty, MD   ?oxybutynin (DITROPAN) 5 MG tablet Take 5 mg by mouth 2 (two) times daily. 11/01/21  Yes [provider]  ?sotalol (BETAPACE) 120 MG tablet TAKE ONE TABLET BY MOUTH TWICE DAILY 05/26/21  Yes Crenshaw, Br

## 2021-12-02 DIAGNOSIS — R69 Illness, unspecified: Secondary | ICD-10-CM | POA: Diagnosis not present

## 2021-12-02 DIAGNOSIS — M5136 Other intervertebral disc degeneration, lumbar region: Secondary | ICD-10-CM | POA: Diagnosis not present

## 2021-12-02 DIAGNOSIS — E669 Obesity, unspecified: Secondary | ICD-10-CM | POA: Diagnosis not present

## 2021-12-02 DIAGNOSIS — Z9181 History of falling: Secondary | ICD-10-CM | POA: Diagnosis not present

## 2021-12-02 DIAGNOSIS — I110 Hypertensive heart disease with heart failure: Secondary | ICD-10-CM | POA: Diagnosis not present

## 2021-12-02 DIAGNOSIS — M5030 Other cervical disc degeneration, unspecified cervical region: Secondary | ICD-10-CM | POA: Diagnosis not present

## 2021-12-02 DIAGNOSIS — G2581 Restless legs syndrome: Secondary | ICD-10-CM | POA: Diagnosis not present

## 2021-12-02 DIAGNOSIS — Z7984 Long term (current) use of oral hypoglycemic drugs: Secondary | ICD-10-CM | POA: Diagnosis not present

## 2021-12-02 DIAGNOSIS — K219 Gastro-esophageal reflux disease without esophagitis: Secondary | ICD-10-CM | POA: Diagnosis not present

## 2021-12-02 DIAGNOSIS — G4733 Obstructive sleep apnea (adult) (pediatric): Secondary | ICD-10-CM | POA: Diagnosis not present

## 2021-12-02 DIAGNOSIS — M1712 Unilateral primary osteoarthritis, left knee: Secondary | ICD-10-CM | POA: Diagnosis not present

## 2021-12-02 DIAGNOSIS — G20 Parkinson's disease: Secondary | ICD-10-CM | POA: Diagnosis not present

## 2021-12-02 DIAGNOSIS — Z853 Personal history of malignant neoplasm of breast: Secondary | ICD-10-CM | POA: Diagnosis not present

## 2021-12-02 DIAGNOSIS — M109 Gout, unspecified: Secondary | ICD-10-CM | POA: Diagnosis not present

## 2021-12-02 DIAGNOSIS — I509 Heart failure, unspecified: Secondary | ICD-10-CM | POA: Diagnosis not present

## 2021-12-02 DIAGNOSIS — I4891 Unspecified atrial fibrillation: Secondary | ICD-10-CM | POA: Diagnosis not present

## 2021-12-07 DIAGNOSIS — F4312 Post-traumatic stress disorder, chronic: Secondary | ICD-10-CM | POA: Diagnosis not present

## 2021-12-07 DIAGNOSIS — F330 Major depressive disorder, recurrent, mild: Secondary | ICD-10-CM | POA: Diagnosis not present

## 2021-12-07 DIAGNOSIS — R69 Illness, unspecified: Secondary | ICD-10-CM | POA: Diagnosis not present

## 2021-12-08 DIAGNOSIS — G4733 Obstructive sleep apnea (adult) (pediatric): Secondary | ICD-10-CM | POA: Diagnosis not present

## 2021-12-08 DIAGNOSIS — I509 Heart failure, unspecified: Secondary | ICD-10-CM | POA: Diagnosis not present

## 2021-12-08 DIAGNOSIS — G2581 Restless legs syndrome: Secondary | ICD-10-CM | POA: Diagnosis not present

## 2021-12-08 DIAGNOSIS — I4891 Unspecified atrial fibrillation: Secondary | ICD-10-CM | POA: Diagnosis not present

## 2021-12-08 DIAGNOSIS — Z9181 History of falling: Secondary | ICD-10-CM | POA: Diagnosis not present

## 2021-12-08 DIAGNOSIS — K219 Gastro-esophageal reflux disease without esophagitis: Secondary | ICD-10-CM | POA: Diagnosis not present

## 2021-12-08 DIAGNOSIS — M5136 Other intervertebral disc degeneration, lumbar region: Secondary | ICD-10-CM | POA: Diagnosis not present

## 2021-12-08 DIAGNOSIS — Z853 Personal history of malignant neoplasm of breast: Secondary | ICD-10-CM | POA: Diagnosis not present

## 2021-12-08 DIAGNOSIS — G20 Parkinson's disease: Secondary | ICD-10-CM | POA: Diagnosis not present

## 2021-12-08 DIAGNOSIS — I110 Hypertensive heart disease with heart failure: Secondary | ICD-10-CM | POA: Diagnosis not present

## 2021-12-08 DIAGNOSIS — E669 Obesity, unspecified: Secondary | ICD-10-CM | POA: Diagnosis not present

## 2021-12-08 DIAGNOSIS — Z7984 Long term (current) use of oral hypoglycemic drugs: Secondary | ICD-10-CM | POA: Diagnosis not present

## 2021-12-08 DIAGNOSIS — M1712 Unilateral primary osteoarthritis, left knee: Secondary | ICD-10-CM | POA: Diagnosis not present

## 2021-12-08 DIAGNOSIS — R69 Illness, unspecified: Secondary | ICD-10-CM | POA: Diagnosis not present

## 2021-12-08 DIAGNOSIS — M5030 Other cervical disc degeneration, unspecified cervical region: Secondary | ICD-10-CM | POA: Diagnosis not present

## 2021-12-08 DIAGNOSIS — M109 Gout, unspecified: Secondary | ICD-10-CM | POA: Diagnosis not present

## 2021-12-09 DIAGNOSIS — R69 Illness, unspecified: Secondary | ICD-10-CM | POA: Diagnosis not present

## 2021-12-09 DIAGNOSIS — M109 Gout, unspecified: Secondary | ICD-10-CM | POA: Diagnosis not present

## 2021-12-09 DIAGNOSIS — Z9181 History of falling: Secondary | ICD-10-CM | POA: Diagnosis not present

## 2021-12-09 DIAGNOSIS — M5136 Other intervertebral disc degeneration, lumbar region: Secondary | ICD-10-CM | POA: Diagnosis not present

## 2021-12-09 DIAGNOSIS — M5030 Other cervical disc degeneration, unspecified cervical region: Secondary | ICD-10-CM | POA: Diagnosis not present

## 2021-12-09 DIAGNOSIS — G20 Parkinson's disease: Secondary | ICD-10-CM | POA: Diagnosis not present

## 2021-12-09 DIAGNOSIS — G4733 Obstructive sleep apnea (adult) (pediatric): Secondary | ICD-10-CM | POA: Diagnosis not present

## 2021-12-09 DIAGNOSIS — K219 Gastro-esophageal reflux disease without esophagitis: Secondary | ICD-10-CM | POA: Diagnosis not present

## 2021-12-09 DIAGNOSIS — G2581 Restless legs syndrome: Secondary | ICD-10-CM | POA: Diagnosis not present

## 2021-12-09 DIAGNOSIS — I509 Heart failure, unspecified: Secondary | ICD-10-CM | POA: Diagnosis not present

## 2021-12-09 DIAGNOSIS — E669 Obesity, unspecified: Secondary | ICD-10-CM | POA: Diagnosis not present

## 2021-12-09 DIAGNOSIS — Z853 Personal history of malignant neoplasm of breast: Secondary | ICD-10-CM | POA: Diagnosis not present

## 2021-12-09 DIAGNOSIS — M1712 Unilateral primary osteoarthritis, left knee: Secondary | ICD-10-CM | POA: Diagnosis not present

## 2021-12-09 DIAGNOSIS — Z7984 Long term (current) use of oral hypoglycemic drugs: Secondary | ICD-10-CM | POA: Diagnosis not present

## 2021-12-09 DIAGNOSIS — I4891 Unspecified atrial fibrillation: Secondary | ICD-10-CM | POA: Diagnosis not present

## 2021-12-09 DIAGNOSIS — I110 Hypertensive heart disease with heart failure: Secondary | ICD-10-CM | POA: Diagnosis not present

## 2021-12-15 DIAGNOSIS — G2581 Restless legs syndrome: Secondary | ICD-10-CM | POA: Diagnosis not present

## 2021-12-15 DIAGNOSIS — R262 Difficulty in walking, not elsewhere classified: Secondary | ICD-10-CM | POA: Diagnosis not present

## 2021-12-15 DIAGNOSIS — Z9181 History of falling: Secondary | ICD-10-CM | POA: Diagnosis not present

## 2021-12-15 DIAGNOSIS — K219 Gastro-esophageal reflux disease without esophagitis: Secondary | ICD-10-CM | POA: Diagnosis not present

## 2021-12-15 DIAGNOSIS — I509 Heart failure, unspecified: Secondary | ICD-10-CM | POA: Diagnosis not present

## 2021-12-15 DIAGNOSIS — Z7984 Long term (current) use of oral hypoglycemic drugs: Secondary | ICD-10-CM | POA: Diagnosis not present

## 2021-12-15 DIAGNOSIS — M5030 Other cervical disc degeneration, unspecified cervical region: Secondary | ICD-10-CM | POA: Diagnosis not present

## 2021-12-15 DIAGNOSIS — G4733 Obstructive sleep apnea (adult) (pediatric): Secondary | ICD-10-CM | POA: Diagnosis not present

## 2021-12-15 DIAGNOSIS — M5136 Other intervertebral disc degeneration, lumbar region: Secondary | ICD-10-CM | POA: Diagnosis not present

## 2021-12-15 DIAGNOSIS — I4891 Unspecified atrial fibrillation: Secondary | ICD-10-CM | POA: Diagnosis not present

## 2021-12-15 DIAGNOSIS — R296 Repeated falls: Secondary | ICD-10-CM | POA: Diagnosis not present

## 2021-12-15 DIAGNOSIS — Z853 Personal history of malignant neoplasm of breast: Secondary | ICD-10-CM | POA: Diagnosis not present

## 2021-12-15 DIAGNOSIS — M1712 Unilateral primary osteoarthritis, left knee: Secondary | ICD-10-CM | POA: Diagnosis not present

## 2021-12-15 DIAGNOSIS — M25642 Stiffness of left hand, not elsewhere classified: Secondary | ICD-10-CM | POA: Diagnosis not present

## 2021-12-15 DIAGNOSIS — M109 Gout, unspecified: Secondary | ICD-10-CM | POA: Diagnosis not present

## 2021-12-15 DIAGNOSIS — E669 Obesity, unspecified: Secondary | ICD-10-CM | POA: Diagnosis not present

## 2021-12-15 DIAGNOSIS — R69 Illness, unspecified: Secondary | ICD-10-CM | POA: Diagnosis not present

## 2021-12-15 DIAGNOSIS — G20 Parkinson's disease: Secondary | ICD-10-CM | POA: Diagnosis not present

## 2021-12-15 DIAGNOSIS — I110 Hypertensive heart disease with heart failure: Secondary | ICD-10-CM | POA: Diagnosis not present

## 2021-12-16 ENCOUNTER — Telehealth: Payer: Self-pay | Admitting: Cardiology

## 2021-12-16 NOTE — Telephone Encounter (Signed)
Spoke to Daughter. Aware will  forward to [facility] pharmacists for  comment and /or recommendation ? Aware will call back with answer ?

## 2021-12-16 NOTE — Telephone Encounter (Signed)
Spoke with pt daughter, she reports the patient is having trouble with parkinson's sleep and it is an off label use for seroquel and helps patients sleep. She wanted to know if we had any recommendations on what the patient could use, so when she talks to the neurologist she can ask. Aware we do not deal with those types of medications but I will forward to see if we have any recommendations.  ?

## 2021-12-16 NOTE — Telephone Encounter (Signed)
Agree, would refer to primary care for sleep meds and sleep hygiene. Certain agents are better for patients who have difficulty falling asleep vs those who are waking up frequently at night. Not sure if something like suvorexant could be an option. Still has CNS depressant effects and interacts with a few of her other meds that already cause CNS depression, but may be safer than adding on 2nd antipsychotic. ?

## 2021-12-16 NOTE — Telephone Encounter (Signed)
Seroquel interacts with many of her medications. She already takes an antipsychotic (Abilify), this seems like duplicate therapy and would need to be confirmed with prescribing physician that the plan would be for her to remain on 2 antipsychotics.  ? ?Seroquel also should not be used with her sotalol due to QTc prolongation risk.  ? ?Also interacts with many of her other medications (Cymbalta, Zanaflex, oxybutynin, gabapentin - either anticholinergic effects or CNS depressant effects would be augmented). ? ?Doesn't look like neuro note from yesterday is complete so can't tell if this is being prescribed for just sleep which seems unusual, or if for a different indication.  ?

## 2021-12-16 NOTE — Telephone Encounter (Signed)
Spoke with pt daughter, aware of the recommendations.her medical doctor was not interested in prescribing anything. ?

## 2021-12-16 NOTE — Telephone Encounter (Signed)
Patient's daughter wants to make sure it is safe for her mother to take Seroquel.  Her neurologist prescribed it to her to help her sleep.  If it is not safe for her to take, does doctor recommend something else to help her sleep.   ?

## 2021-12-18 DIAGNOSIS — E669 Obesity, unspecified: Secondary | ICD-10-CM | POA: Diagnosis not present

## 2021-12-18 DIAGNOSIS — G2581 Restless legs syndrome: Secondary | ICD-10-CM | POA: Diagnosis not present

## 2021-12-18 DIAGNOSIS — G4733 Obstructive sleep apnea (adult) (pediatric): Secondary | ICD-10-CM | POA: Diagnosis not present

## 2021-12-18 DIAGNOSIS — Z7984 Long term (current) use of oral hypoglycemic drugs: Secondary | ICD-10-CM | POA: Diagnosis not present

## 2021-12-18 DIAGNOSIS — K219 Gastro-esophageal reflux disease without esophagitis: Secondary | ICD-10-CM | POA: Diagnosis not present

## 2021-12-18 DIAGNOSIS — G20 Parkinson's disease: Secondary | ICD-10-CM | POA: Diagnosis not present

## 2021-12-18 DIAGNOSIS — M109 Gout, unspecified: Secondary | ICD-10-CM | POA: Diagnosis not present

## 2021-12-18 DIAGNOSIS — M5030 Other cervical disc degeneration, unspecified cervical region: Secondary | ICD-10-CM | POA: Diagnosis not present

## 2021-12-18 DIAGNOSIS — I4891 Unspecified atrial fibrillation: Secondary | ICD-10-CM | POA: Diagnosis not present

## 2021-12-18 DIAGNOSIS — M1712 Unilateral primary osteoarthritis, left knee: Secondary | ICD-10-CM | POA: Diagnosis not present

## 2021-12-18 DIAGNOSIS — R69 Illness, unspecified: Secondary | ICD-10-CM | POA: Diagnosis not present

## 2021-12-18 DIAGNOSIS — M5136 Other intervertebral disc degeneration, lumbar region: Secondary | ICD-10-CM | POA: Diagnosis not present

## 2021-12-18 DIAGNOSIS — Z9181 History of falling: Secondary | ICD-10-CM | POA: Diagnosis not present

## 2021-12-18 DIAGNOSIS — I110 Hypertensive heart disease with heart failure: Secondary | ICD-10-CM | POA: Diagnosis not present

## 2021-12-18 DIAGNOSIS — I509 Heart failure, unspecified: Secondary | ICD-10-CM | POA: Diagnosis not present

## 2021-12-18 DIAGNOSIS — Z853 Personal history of malignant neoplasm of breast: Secondary | ICD-10-CM | POA: Diagnosis not present

## 2021-12-20 ENCOUNTER — Other Ambulatory Visit: Payer: Self-pay | Admitting: Family

## 2021-12-20 ENCOUNTER — Telehealth: Payer: Self-pay | Admitting: Cardiology

## 2021-12-20 NOTE — Telephone Encounter (Signed)
Calling to send what medication Dr. Stanford Breed recommend for mood. Patient is off the Abilify Please advise  ?

## 2021-12-20 NOTE — Telephone Encounter (Signed)
I attempted to contact [facility] and was placed on hold, unable to hold for a long period time. Will try again later. ? ? ?

## 2021-12-21 ENCOUNTER — Telehealth: Payer: Self-pay

## 2021-12-21 NOTE — Progress Notes (Signed)
? ? ?Chronic Care Management ?Pharmacy Assistant  ? ?Name: Selena Bush  MRN: [id number] DOB: [birth year] ? ?Reason for Encounter: CCM (Medication Adherence and Delivery Coordination) ?  ?Recent office visits:  ?11/24/21 [name], NP: Hallucinations Stop (no longer needed) Albuterol 108 MCG ? ?Recent consult visits:  ?12/21/21 [name], MD (Cardiology): Per telephone conversation patient is not taking Abilify.  ?12/15/21 Gurney Maxin, MD (Neurology): Parkinson's disease. Start: Seroquel 25 mg for one week; then increase to 50 mg nightly. Stop: Melatonin. Start: Vit D 2516181372 units daily.   ?11/25/21 Darylene Price, NP (Cardiology): Change: SINEMET CR 25-100 MG tablet controlled release - 1 tab at bedtime. Change: Gabapentin 100 mg caps three times daily.  ?11/22/21 Gurney Maxin, MD (Neurology): Parkinson's Disease No med changes ? ?Hospital visits:  ?None in previous 6 months ? ?Medications: ?Outpatient Encounter Medications as of 12/21/2021  ?Medication Sig  ? acetaminophen (TYLENOL) 500 MG tablet Take by mouth.  ? ARIPiprazole (ABILIFY) 5 MG tablet Take 5 mg by mouth at bedtime.  ? carbidopa-levodopa (SINEMET IR) 25-100 MG tablet Take 2 tablets at 6 AM, 1 at 9AM, 1 tab at 12 PM,  2 at 3 PM, 1 at 6 PM and 2 at 9 PM.  ? Carbidopa-Levodopa ER (SINEMET CR) 25-100 MG tablet controlled release Take 1 tab at 9pm  ? diltiazem (CARDIZEM CD) 180 MG 24 hr capsule TAKE ONE CAPSULE BY MOUTH EVERY MORNING  ? DULoxetine (CYMBALTA) 60 MG capsule TAKE ONE CAPSULE BY MOUTH TWICE DAILY FOR anxiety AND depression  ? ezetimibe (ZETIA) 10 MG tablet TAKE ONE TABLET BY MOUTH EVERY MORNING  ? fluticasone (FLONASE) 50 MCG/ACT nasal spray Place 2 sprays into both nostrils daily as needed for rhinitis or allergies.  ? furosemide (LASIX) 20 MG tablet Take 1 tablet (20 mg total) by mouth every morning.  ? furosemide (LASIX) 20 MG tablet Take 1 tablet (20 mg total) by mouth as needed. If needed above your regular daily dose of  furosemide  ? gabapentin (NEURONTIN) 100 MG capsule TAKE 1 CAPSULE BY MOUTH THREE TIMES A DAY for anxiety.  ? hydrocortisone 2.5 % cream Apply to affected under breasts 1-2 times a day as directed and as needed for itch. Can also use on face as needed for itch  ? JARDIANCE 10 MG TABS tablet TAKE ONE TABLET BY MOUTH BEFORE BREAKFAST  ? ketoconazole (NIZORAL) 2 % cream Apply to affected areas under breast twice daily for rash until healed. Can also use on face as needed for dermatitis  ? metoprolol tartrate (LOPRESSOR) 25 MG tablet TAKE THREE TABLETS BY MOUTH TWICE DAILY  ? metroNIDAZOLE (METROCREAM) 0.75 % cream Apply topically 2 (two) times daily. Apply to red areas of face  ? oxybutynin (DITROPAN) 5 MG tablet Take 5 mg by mouth 2 (two) times daily.  ? sotalol (BETAPACE) 120 MG tablet TAKE ONE TABLET BY MOUTH TWICE DAILY  ? spironolactone (ALDACTONE) 25 MG tablet Take 1 tablet (25 mg total) by mouth daily.  ? tiZANidine (ZANAFLEX) 4 MG tablet TAKE 2 TABLET (4 MG TOTAL) BY MOUTH DAILY FOR MUSCLE SPASMS AT BEDTIME.  ? ?No facility-administered encounter medications on file as of 12/21/2021.  ? ?BP Readings from Last 3 Encounters:  ?11/25/21 124/76  ?11/24/21 124/72  ?11/12/21 124/76  ?  ?Lab Results  ?Component Value Date  ? HGBA1C 5.8 (A) 06/22/2021  ?  ? ?Recent OV, Consult or Hospital visit:  ?Recent medication changes indicated:  ? Stop (no longer needed) Albuterol 108  MCG ? Start: Seroquel 25 mg for one week; then increase to 50 mg nightly - no taking ?Stop: Melatonin.  ?Start: Vit D [id number] units daily.   ?Change: Gabapentin 100 mg caps three times daily.  ? ?Last adherence delivery date: 12/02/2021     ? ?Patient is due for next adherence delivery on: 12/31/21 ? ?Spoke with patient on 12/31/2021 reviewed medications and coordinated delivery. ? ?This delivery to include: Adherence Packaging  30 Days  ?Packs: ?Oxybutynin 5 mg 1 tab at breakfast 1 tab at evening meal ?Sotalol '120mg'$  1 tablet twice daily (1 breakfast  and 1 evening meal) ?Tizanidine '4mg'$  2 tablets daily (2 bedtime) ?Ezetimibe '10mg'$  1 tablet daily (breakfast) ?Gabapentin 100 mg 1 tablet at breakfast, 1 tablet at evening meal, 1 tab at bedtime  ?Spironolactone '25mg'$  tablet 1 tablet (breakfast) ?Jardiance 10 mg 1 tablet daily (before breakfast) ?Diltiazem '180mg'$  1 tablet daily (breakfast) ?Duloxetine 60 mg 1 tablet twice a day (1 breakfast, 1 evening meal) ?Metoprolol '25mg'$  3 tablets twice a day (3 breakfast and 3 evening meal) ?Furosemide 20 mg - 1 tablet daily (breakfast) ?Carbidopa-Levodopa ER 25-'100mg'$  - 1 tablet at bedtime ?Vitamin D - 800 units - 1 tablet daily (breakfast) ? ?VIAL medications: w/o safety caps ?Carbidopa-Levodopa 25-'100mg'$ Take 2 tablets at 6 AM, 1 at 9AM, 1 tab at 12 PM,  2 at 3 PM, 1 at 6 PM and 2 at 9 PM  May take extra tab as need.  ?Gabapentin '100mg'$  1 capsule three times a day (1 breakfast, 1 evening meal, 1 bedtime) May take 1 extra daily, PRN in vial.  ?Furosemide 20 mg - 1 tablet PRN above regular daily dose of Furosemide  ?Fluticasone Propionate 50 mcg Place 2 sprays into both nostrils daily  ?  ?Patient declined the following medications this month: ?Ketoconazole Shampoo 2%-  uses PRN ?Hydrocortisone 2.5% - uses PRN ?Aripiprazole 5 mg 1 tablet at bedtime - no longer taking ? ?Declined by provider [name], NP - Cardiology) until labs are drawn: Patient is aware; gave patient phone number and asked her to call to make appt.  ?Potassium Chloride 10 MEQ CR Capsule - 1 capsule every morning (1 breakfast) ?  ?Any concerns about your medications? No ?  ?How often do you forget or accidentally miss a dose? Rarely ?  ?Do you use a pillbox? No ? ?Is patient in packaging Yes ? If yes ? What is the date on your next pill pack? 12/23/2021 ? Any concerns or issues with your packaging? No ? ?Refills requested from providers include: ?Oxybutynin 5 mg 1 tab at breakfast 1 tab at evening meal  ?Jardiance 10 mg 1 tablet daily (before  breakfast) ?Aripiprazole 5 mg 1 tablet at bedtime ? ?Confirmed delivery date of 12/31/2021, advised patient that pharmacy will contact them the morning of delivery.  ? ?Recent blood pressure readings are as follows:  Patient does not check at home.  ? ?Recent blood glucose readings are as follows: Patient does not check at home.  ? ?Annual wellness visit in last year? Yes 04/13/2021 ?Most Recent BP reading: 124/76 on 11/25/2021 ? ?If Diabetic: ?Most recent A1C reading: 5.8 on 06/22/21 ?Last eye exam / retinopathy screening: Up to date ?Last diabetic foot exam: Up to date ? ?Upcoming appointments: ?CCM appointment on 01/05/2022 ?Mammogram appointment on 02/08/2022 ?Cardiology appointment on 02/08/2022 ? ?Charlene Brooke, CPP notified ? ?[name], RMA ?Clinical Pharmacy Assistant ?[phone] ?

## 2021-12-22 DIAGNOSIS — I4891 Unspecified atrial fibrillation: Secondary | ICD-10-CM | POA: Diagnosis not present

## 2021-12-22 DIAGNOSIS — M109 Gout, unspecified: Secondary | ICD-10-CM | POA: Diagnosis not present

## 2021-12-22 DIAGNOSIS — R69 Illness, unspecified: Secondary | ICD-10-CM | POA: Diagnosis not present

## 2021-12-22 DIAGNOSIS — Z7984 Long term (current) use of oral hypoglycemic drugs: Secondary | ICD-10-CM | POA: Diagnosis not present

## 2021-12-22 DIAGNOSIS — I110 Hypertensive heart disease with heart failure: Secondary | ICD-10-CM | POA: Diagnosis not present

## 2021-12-22 DIAGNOSIS — I509 Heart failure, unspecified: Secondary | ICD-10-CM | POA: Diagnosis not present

## 2021-12-22 DIAGNOSIS — Z9181 History of falling: Secondary | ICD-10-CM | POA: Diagnosis not present

## 2021-12-22 DIAGNOSIS — E669 Obesity, unspecified: Secondary | ICD-10-CM | POA: Diagnosis not present

## 2021-12-22 DIAGNOSIS — Z853 Personal history of malignant neoplasm of breast: Secondary | ICD-10-CM | POA: Diagnosis not present

## 2021-12-22 DIAGNOSIS — M5030 Other cervical disc degeneration, unspecified cervical region: Secondary | ICD-10-CM | POA: Diagnosis not present

## 2021-12-22 DIAGNOSIS — G2581 Restless legs syndrome: Secondary | ICD-10-CM | POA: Diagnosis not present

## 2021-12-22 DIAGNOSIS — G20 Parkinson's disease: Secondary | ICD-10-CM | POA: Diagnosis not present

## 2021-12-22 DIAGNOSIS — M1712 Unilateral primary osteoarthritis, left knee: Secondary | ICD-10-CM | POA: Diagnosis not present

## 2021-12-22 DIAGNOSIS — G4733 Obstructive sleep apnea (adult) (pediatric): Secondary | ICD-10-CM | POA: Diagnosis not present

## 2021-12-22 DIAGNOSIS — M5136 Other intervertebral disc degeneration, lumbar region: Secondary | ICD-10-CM | POA: Diagnosis not present

## 2021-12-22 DIAGNOSIS — K219 Gastro-esophageal reflux disease without esophagitis: Secondary | ICD-10-CM | POA: Diagnosis not present

## 2021-12-27 DIAGNOSIS — G20 Parkinson's disease: Secondary | ICD-10-CM | POA: Diagnosis not present

## 2021-12-27 DIAGNOSIS — M109 Gout, unspecified: Secondary | ICD-10-CM | POA: Diagnosis not present

## 2021-12-27 DIAGNOSIS — M5136 Other intervertebral disc degeneration, lumbar region: Secondary | ICD-10-CM | POA: Diagnosis not present

## 2021-12-27 DIAGNOSIS — K219 Gastro-esophageal reflux disease without esophagitis: Secondary | ICD-10-CM | POA: Diagnosis not present

## 2021-12-27 DIAGNOSIS — G4733 Obstructive sleep apnea (adult) (pediatric): Secondary | ICD-10-CM | POA: Diagnosis not present

## 2021-12-27 DIAGNOSIS — E669 Obesity, unspecified: Secondary | ICD-10-CM | POA: Diagnosis not present

## 2021-12-27 DIAGNOSIS — M5030 Other cervical disc degeneration, unspecified cervical region: Secondary | ICD-10-CM | POA: Diagnosis not present

## 2021-12-27 DIAGNOSIS — G2581 Restless legs syndrome: Secondary | ICD-10-CM | POA: Diagnosis not present

## 2021-12-27 DIAGNOSIS — R69 Illness, unspecified: Secondary | ICD-10-CM | POA: Diagnosis not present

## 2021-12-27 DIAGNOSIS — Z9181 History of falling: Secondary | ICD-10-CM | POA: Diagnosis not present

## 2021-12-27 DIAGNOSIS — I4891 Unspecified atrial fibrillation: Secondary | ICD-10-CM | POA: Diagnosis not present

## 2021-12-27 DIAGNOSIS — I509 Heart failure, unspecified: Secondary | ICD-10-CM | POA: Diagnosis not present

## 2021-12-27 DIAGNOSIS — Z7984 Long term (current) use of oral hypoglycemic drugs: Secondary | ICD-10-CM | POA: Diagnosis not present

## 2021-12-27 DIAGNOSIS — I110 Hypertensive heart disease with heart failure: Secondary | ICD-10-CM | POA: Diagnosis not present

## 2021-12-27 DIAGNOSIS — Z853 Personal history of malignant neoplasm of breast: Secondary | ICD-10-CM | POA: Diagnosis not present

## 2021-12-27 DIAGNOSIS — M1712 Unilateral primary osteoarthritis, left knee: Secondary | ICD-10-CM | POA: Diagnosis not present

## 2021-12-27 NOTE — Telephone Encounter (Signed)
Left message for courtney to call with what medications they are considering. ?

## 2021-12-28 DIAGNOSIS — G2581 Restless legs syndrome: Secondary | ICD-10-CM | POA: Diagnosis not present

## 2021-12-28 DIAGNOSIS — M1712 Unilateral primary osteoarthritis, left knee: Secondary | ICD-10-CM | POA: Diagnosis not present

## 2021-12-28 DIAGNOSIS — M5030 Other cervical disc degeneration, unspecified cervical region: Secondary | ICD-10-CM | POA: Diagnosis not present

## 2021-12-28 DIAGNOSIS — I509 Heart failure, unspecified: Secondary | ICD-10-CM | POA: Diagnosis not present

## 2021-12-28 DIAGNOSIS — Z853 Personal history of malignant neoplasm of breast: Secondary | ICD-10-CM | POA: Diagnosis not present

## 2021-12-28 DIAGNOSIS — G4733 Obstructive sleep apnea (adult) (pediatric): Secondary | ICD-10-CM | POA: Diagnosis not present

## 2021-12-28 DIAGNOSIS — K219 Gastro-esophageal reflux disease without esophagitis: Secondary | ICD-10-CM | POA: Diagnosis not present

## 2021-12-28 DIAGNOSIS — I110 Hypertensive heart disease with heart failure: Secondary | ICD-10-CM | POA: Diagnosis not present

## 2021-12-28 DIAGNOSIS — M5136 Other intervertebral disc degeneration, lumbar region: Secondary | ICD-10-CM | POA: Diagnosis not present

## 2021-12-28 DIAGNOSIS — M109 Gout, unspecified: Secondary | ICD-10-CM | POA: Diagnosis not present

## 2021-12-28 DIAGNOSIS — E669 Obesity, unspecified: Secondary | ICD-10-CM | POA: Diagnosis not present

## 2021-12-28 DIAGNOSIS — G20 Parkinson's disease: Secondary | ICD-10-CM | POA: Diagnosis not present

## 2021-12-28 DIAGNOSIS — I4891 Unspecified atrial fibrillation: Secondary | ICD-10-CM | POA: Diagnosis not present

## 2021-12-28 DIAGNOSIS — Z7984 Long term (current) use of oral hypoglycemic drugs: Secondary | ICD-10-CM | POA: Diagnosis not present

## 2021-12-28 DIAGNOSIS — R69 Illness, unspecified: Secondary | ICD-10-CM | POA: Diagnosis not present

## 2021-12-28 DIAGNOSIS — Z9181 History of falling: Secondary | ICD-10-CM | POA: Diagnosis not present

## 2021-12-29 NOTE — Telephone Encounter (Signed)
Tried to call placed on hold for long period had to hang up, will have to call again later. ?

## 2021-12-30 ENCOUNTER — Telehealth: Payer: Self-pay | Admitting: Primary Care

## 2021-12-30 NOTE — Telephone Encounter (Signed)
Left message for patient to call back and schedule Medicare Annual Wellness Visit (AWV) either virtually or phone   Last AWV 10/02/17  please schedule at anytime with health coach  I left my direct # [phone]

## 2022-01-03 ENCOUNTER — Telehealth: Payer: Self-pay

## 2022-01-03 NOTE — Progress Notes (Signed)
    Chronic Care Management Pharmacy Assistant   Name: Selena Bush  MRN: [id number] DOB: [birth year]  Reason for Encounter: CCM (Appointment Reminder)  Medications: Outpatient Encounter Medications as of 01/03/2022  Medication Sig   acetaminophen (TYLENOL) 500 MG tablet Take by mouth.   ARIPiprazole (ABILIFY) 5 MG tablet Take 5 mg by mouth at bedtime.   carbidopa-levodopa (SINEMET IR) 25-100 MG tablet Take 2 tablets at 6 AM, 1 at 9AM, 1 tab at 12 PM,  2 at 3 PM, 1 at 6 PM and 2 at 9 PM.   Carbidopa-Levodopa ER (SINEMET CR) 25-100 MG tablet controlled release Take 1 tab at 9pm   diltiazem (CARDIZEM CD) 180 MG 24 hr capsule TAKE ONE CAPSULE BY MOUTH EVERY MORNING   DULoxetine (CYMBALTA) 60 MG capsule TAKE ONE CAPSULE BY MOUTH TWICE DAILY FOR anxiety AND depression   ezetimibe (ZETIA) 10 MG tablet TAKE ONE TABLET BY MOUTH EVERY MORNING   fluticasone (FLONASE) 50 MCG/ACT nasal spray Place 2 sprays into both nostrils daily as needed for rhinitis or allergies.   furosemide (LASIX) 20 MG tablet Take 1 tablet (20 mg total) by mouth every morning.   furosemide (LASIX) 20 MG tablet Take 1 tablet (20 mg total) by mouth as needed. If needed above your regular daily dose of furosemide   gabapentin (NEURONTIN) 100 MG capsule TAKE 1 CAPSULE BY MOUTH THREE TIMES A DAY for anxiety.   hydrocortisone 2.5 % cream Apply to affected under breasts 1-2 times a day as directed and as needed for itch. Can also use on face as needed for itch   JARDIANCE 10 MG TABS tablet TAKE ONE TABLET BY MOUTH BEFORE BREAKFAST   ketoconazole (NIZORAL) 2 % cream Apply to affected areas under breast twice daily for rash until healed. Can also use on face as needed for dermatitis   metoprolol tartrate (LOPRESSOR) 25 MG tablet TAKE THREE TABLETS BY MOUTH TWICE DAILY   metroNIDAZOLE (METROCREAM) 0.75 % cream Apply topically 2 (two) times daily. Apply to red areas of face   oxybutynin (DITROPAN) 5 MG tablet Take 5 mg by mouth 2 (two)  times daily.   sotalol (BETAPACE) 120 MG tablet TAKE ONE TABLET BY MOUTH TWICE DAILY   spironolactone (ALDACTONE) 25 MG tablet Take 1 tablet (25 mg total) by mouth daily.   tiZANidine (ZANAFLEX) 4 MG tablet TAKE 2 TABLET (4 MG TOTAL) BY MOUTH DAILY FOR MUSCLE SPASMS AT BEDTIME.   No facility-administered encounter medications on file as of 01/03/2022.   Selena Bush was contacted to remind of upcoming telephone visit with Charlene Brooke on 01/05/2022 at 3:00. Patient was reminded to have any blood glucose and blood pressure readings available for review at appointment.   Patient confirmed appointment.  Are you having any problems with your medications? No   Do you have any concerns you like to discuss with the pharmacist? No  CCM referral has been placed prior to visit?  Yes   Star Rating Drugs: Medication:  Last Fill: Day Supply Jardiance 10 mg 12/28/2021 Simpson, CPP notified  [name], [facility] Pharmacy Assistant [phone]

## 2022-01-04 ENCOUNTER — Ambulatory Visit: Payer: Medicare HMO | Admitting: Dermatology

## 2022-01-04 DIAGNOSIS — I110 Hypertensive heart disease with heart failure: Secondary | ICD-10-CM | POA: Diagnosis not present

## 2022-01-04 DIAGNOSIS — K219 Gastro-esophageal reflux disease without esophagitis: Secondary | ICD-10-CM | POA: Diagnosis not present

## 2022-01-04 DIAGNOSIS — G20 Parkinson's disease: Secondary | ICD-10-CM | POA: Diagnosis not present

## 2022-01-04 DIAGNOSIS — Z9181 History of falling: Secondary | ICD-10-CM | POA: Diagnosis not present

## 2022-01-04 DIAGNOSIS — I4891 Unspecified atrial fibrillation: Secondary | ICD-10-CM | POA: Diagnosis not present

## 2022-01-04 DIAGNOSIS — M5030 Other cervical disc degeneration, unspecified cervical region: Secondary | ICD-10-CM | POA: Diagnosis not present

## 2022-01-04 DIAGNOSIS — I509 Heart failure, unspecified: Secondary | ICD-10-CM | POA: Diagnosis not present

## 2022-01-04 DIAGNOSIS — G2581 Restless legs syndrome: Secondary | ICD-10-CM | POA: Diagnosis not present

## 2022-01-04 DIAGNOSIS — M109 Gout, unspecified: Secondary | ICD-10-CM | POA: Diagnosis not present

## 2022-01-04 DIAGNOSIS — R69 Illness, unspecified: Secondary | ICD-10-CM | POA: Diagnosis not present

## 2022-01-04 DIAGNOSIS — E669 Obesity, unspecified: Secondary | ICD-10-CM | POA: Diagnosis not present

## 2022-01-04 DIAGNOSIS — M5136 Other intervertebral disc degeneration, lumbar region: Secondary | ICD-10-CM | POA: Diagnosis not present

## 2022-01-04 DIAGNOSIS — Z853 Personal history of malignant neoplasm of breast: Secondary | ICD-10-CM | POA: Diagnosis not present

## 2022-01-04 DIAGNOSIS — M1712 Unilateral primary osteoarthritis, left knee: Secondary | ICD-10-CM | POA: Diagnosis not present

## 2022-01-04 DIAGNOSIS — Z7984 Long term (current) use of oral hypoglycemic drugs: Secondary | ICD-10-CM | POA: Diagnosis not present

## 2022-01-04 DIAGNOSIS — G4733 Obstructive sleep apnea (adult) (pediatric): Secondary | ICD-10-CM | POA: Diagnosis not present

## 2022-01-05 ENCOUNTER — Ambulatory Visit (INDEPENDENT_AMBULATORY_CARE_PROVIDER_SITE_OTHER): Payer: Medicare HMO | Admitting: Pharmacist

## 2022-01-05 ENCOUNTER — Telehealth: Payer: Medicare HMO

## 2022-01-05 DIAGNOSIS — G20 Parkinson's disease: Secondary | ICD-10-CM

## 2022-01-05 DIAGNOSIS — I10 Essential (primary) hypertension: Secondary | ICD-10-CM

## 2022-01-05 DIAGNOSIS — F3341 Major depressive disorder, recurrent, in partial remission: Secondary | ICD-10-CM

## 2022-01-05 DIAGNOSIS — I5032 Chronic diastolic (congestive) heart failure: Secondary | ICD-10-CM

## 2022-01-05 DIAGNOSIS — E7800 Pure hypercholesterolemia, unspecified: Secondary | ICD-10-CM

## 2022-01-05 DIAGNOSIS — I480 Paroxysmal atrial fibrillation: Secondary | ICD-10-CM

## 2022-01-05 DIAGNOSIS — F419 Anxiety disorder, unspecified: Secondary | ICD-10-CM

## 2022-01-05 DIAGNOSIS — R7303 Prediabetes: Secondary | ICD-10-CM

## 2022-01-05 NOTE — Progress Notes (Signed)
Chronic Care Management Pharmacy Note  01/12/2022 Name:  Selena Bush MRN:  [id number] DOB:  1946-03-27  Summary: CCM F/U visit -Reviewed medications; pt affirms compliance as prescribed except she is off of aripiprazole which was discontinued per neurology - removed from list. -Afib/HF: some concern for bradycardia, HR was low 50s in recent OV and pt endorses some fatigue. She is notably on 3 rate control agents (sotalol, metoprolol, diltiazem)  -Mental health: Recent concerns for hallucinations - Neurology prescribed Seroquel but this was stopped quickly due to drug interactions with sotalol (Qtc prolongation) per cardiology; today pt does not endorse hallucinations and feels she is doing well with just duloxetine  Recommendations/Changes made from today's visit: -Advised to monitor pulse rate at home frequently; contact cardiology with HR < 50 or s/sx bradycardia -Advised pt to contact Dr Melrose Nakayama or PCP if hallucination recur  Plan: -[place] will call patient monthly for medication coordination -Pharmacist follow up televisit scheduled for 3 months -PCP CPE due 06/2022    Subjective: Selena Bush is an 76 y.o. year old female who is a primary patient of Pleas Koch, NP.  The CCM team was consulted for assistance with disease management and care coordination needs.    Engaged with patient by telephone for follow up visit in response to provider referral for pharmacy case management and/or care coordination services.   Consent to Services:  The patient was given information about Chronic Care Management services, agreed to services, and gave verbal consent prior to initiation of services.  Please see initial visit note for detailed documentation.   Patient Care Team: Pleas Koch, NP as PCP - General (Internal Medicine) Stanford Breed Denice Bors, MD as PCP - Cardiology (Cardiology) Bary Castilla Forest Gleason, MD (General Surgery) Corey Harold, MD as Consulting  Physician Charlton Haws, [facility] as Pharmacist (Pharmacist)  Recent office visits: 11/24/21 [name], NP: Hallucinations, UTI - Urine cx negative. F/u with neurology regarding hallucinations.  Recent consult visits: 12/21/21 [name], MD (Cardiology): Per telephone conversation patient is not taking Abilify.  12/15/21 Gurney Maxin, MD (Neurology): Parkinson's disease. Start: Seroquel 25 mg for one week; then increase to 50 mg nightly. Stop: Melatonin. Start: Vit D [phone] units daily.   11/25/21 Darylene Price, NP (Cardiology): Change: SINEMET CR 25-100 MG tablet controlled release - 1 tab at bedtime. Change: Gabapentin 100 mg caps three times daily.  11/22/21 Gurney Maxin, MD (Neurology): Parkinson's Disease No med changes  Hospital visits: None in previous 6 months   Objective:  Lab Results  Component Value Date   CREATININE 1.20 11/24/2021   BUN 23 11/24/2021   GFR 44.90 (L) 11/24/2021   EGFR 49 (L) 05/21/2021   GFRNONAA 45 (L) 07/13/2021   GFRAA 54 (L) 07/15/2019   NA 132 (L) 11/24/2021   K 4.3 11/24/2021   CALCIUM 9.6 11/24/2021   CO2 32 11/24/2021   GLUCOSE 89 11/24/2021    Lab Results  Component Value Date/Time   HGBA1C 5.8 (A) 06/22/2021 03:29 PM   HGBA1C 5.9 06/01/2020 11:11 AM   HGBA1C 5.6 10/30/2019 03:37 PM   GFR 44.90 (L) 11/24/2021 09:35 AM   GFR 56.91 (L) 06/01/2020 11:11 AM    Last diabetic Eye exam:  Lab Results  Component Value Date/Time   HMDIABEYEEXA No Retinopathy 07/27/2021 12:00 AM    Last diabetic Foot exam: No results found for: HMDIABFOOTEX   Lab Results  Component Value Date   CHOL 165 12/31/2020   HDL 62 12/31/2020  LDLCALC 84 12/31/2020   TRIG 109 12/31/2020   CHOLHDL 2.7 12/31/2020       Latest Ref Rng & Units 05/21/2021    3:05 PM 12/31/2020    9:33 AM 10/30/2019    3:37 PM  Hepatic Function  Total Protein 6.0 - 8.5 g/dL 6.3   6.3   6.8    Albumin 3.7 - 4.7 g/dL 4.2   3.9   4.0    AST 0 - 40 IU/L _0 ALT 0 - 32 IU/L _1 Alk Phosphatase 44 - 121 IU/L 107   107   92    Total Bilirubin 0.0 - 1.2 mg/dL 0.3   0.5   0.4    Bilirubin, Direct 0.00 - 0.40 mg/dL  0.14       Lab Results  Component Value Date/Time   TSH 1.780 05/21/2021 03:05 PM   TSH 3.230 01/05/2018 05:46 PM       Latest Ref Rng & Units 11/24/2021    9:35 AM 06/05/2021    5:18 PM 10/30/2019    3:37 PM  CBC  WBC 4.0 - 10.5 K/uL 9.7   6.6   6.5    Hemoglobin 12.0 - 15.0 g/dL 13.4   13.1   14.2    Hematocrit 36.0 - 46.0 % 41.7   39.2   42.5    Platelets 150.0 - 400.0 K/uL 270.0   187   248.0      No results found for: VD25OH  Clinical ASCVD: No  The 10-year ASCVD risk score (Arnett DK, et al., 2019) is: 22%   Values used to calculate the score:     Age: 76 years     Sex: Female     Is Non-Hispanic African American: No     Diabetic: Yes     Tobacco smoker: No     Systolic Blood Pressure: 381 mmHg     Is BP treated: Yes     HDL Cholesterol: 62 mg/dL     Total Cholesterol: 165 mg/dL       01/06/2022   11:41 AM 11/12/2021   11:48 AM 11/12/2021   11:47 AM  Depression screen PHQ 2/9  Decreased Interest 0 0 0  Down, Depressed, Hopeless 0 1 0  PHQ - 2 Score 0 1 0  Altered sleeping  0   Tired, decreased energy  0   Change in appetite  0   Feeling bad or failure about yourself   1   Trouble concentrating  0   Moving slowly or fidgety/restless  1   Suicidal thoughts  0   PHQ-9 Score  3      CHA2DS2/VAS Stroke Risk Points  Current as of yesterday     5 >= 2 Points: High Risk  1 - 1.99 Points: Medium Risk  0 Points: Low Risk    No Change     Points Metrics  1 Has Congestive Heart Failure:  Yes    Current as of yesterday  0 Has Vascular Disease:  No    Current as of yesterday  1 Has Hypertension:  Yes    Current as of yesterday  1 Age:  76    Current as of yesterday  1 Has Diabetes:  Yes     Current as of yesterday  0 Had Stroke:  No  Had TIA:  No  Had Thromboembolism:  No  Current  as of yesterday  1 Female:  Yes    Current as of yesterday   Social History   Tobacco Use  Smoking Status Never  Smokeless Tobacco Never  Tobacco Comments   social smoker as a teen   BP Readings from Last 3 Encounters:  11/25/21 124/76  11/24/21 124/72  11/12/21 124/76   Pulse Readings from Last 3 Encounters:  11/25/21 (!) 51  11/24/21 (!) 55  11/12/21 64   Wt Readings from Last 3 Encounters:  01/06/22 155 lb (70.3 kg)  11/25/21 159 lb (72.1 kg)  11/24/21 160 lb (72.6 kg)   BMI Readings from Last 3 Encounters:  01/06/22 28.35 kg/m  11/25/21 29.08 kg/m  11/24/21 29.26 kg/m    Assessment/Interventions: Review of patient past medical history, allergies, medications, health status, including review of consultants reports, laboratory and other test data, was performed as part of comprehensive evaluation and provision of chronic care management services.   SDOH:  (Social Determinants of Health) assessments and interventions performed: No - done 12/2021  SDOH Screenings   Alcohol Screen: Not on file  Depression (PHQ2-9): Low Risk    PHQ-2 Score: 0  Financial Resource Strain: Low Risk    Difficulty of Paying Living Expenses: Not hard at all  Food Insecurity: No Food Insecurity   Worried About Charity fundraiser in the Last Year: Never true   Ran Out of Food in the Last Year: Never true  Housing: Not on file  Physical Activity: Insufficiently Active   Days of Exercise per Week: 7 days   Minutes of Exercise per Session: 10 min  Social Connections: Not on file  Stress: No Stress Concern Present   Feeling of Stress : Not at all  Tobacco Use: Low Risk    Smoking Tobacco Use: Never   Smokeless Tobacco Use: Never   Passive Exposure: Not on file  Transportation Needs: No Transportation Needs   Lack of Transportation (Medical): No   Lack of Transportation (Non-Medical): No    CCM Care Plan  Allergies  Allergen Reactions   Penicillins Anaphylaxis    anaphylaxis   Has patient had a PCN reaction causing immediate rash, facial/tongue/throat swelling, SOB or lightheadedness with hypotension: Yes Has patient had a PCN reaction causing severe rash involving mucus membranes or skin necrosis: No Has patient had a PCN reaction that required hospitalization: No Has patient had a PCN reaction occurring within the last 10 years: No If all of the above answers are "NO", then may proceed with Cephalosporin use.    Sulfa Antibiotics Anaphylaxis   Codeine Other (See Comments) and Nausea And Vomiting    Gi problems    Statins Other (See Comments)    Leg pains   Aspirin Other (See Comments)    "burned my stomach intensely" Abdominal pain and burning    Medications Reviewed Today     Reviewed by Kellie Simmering, LPN (Licensed Practical Nurse) on 01/06/22 at 62  Med List Status: <None>   Medication Order Taking? Sig Documenting Provider Last Dose Status Informant  acetaminophen (TYLENOL) 500 MG tablet [id number] Yes Take by mouth. [provider] Taking Active   carbidopa-levodopa (SINEMET IR) 25-100 MG tablet [id number] Yes Take 2 tablets at 6 AM, 1 at 9AM, 1 tab at 12 PM,  2 at 3 PM, 1 at 6 PM and 2 at 9 PM. Darylene Price A, FNP Taking Active   Carbidopa-Levodopa ER (SINEMET CR) 25-100 MG tablet controlled release [id number] Yes Take 1  tab at 9pm [provider] Taking Active   diltiazem (CARDIZEM CD) 180 MG 24 hr capsule [id number] Yes TAKE ONE CAPSULE BY MOUTH EVERY MORNING Stanford Breed Denice Bors, MD Taking Active   DULoxetine (CYMBALTA) 60 MG capsule [id number] Yes TAKE ONE CAPSULE BY MOUTH TWICE DAILY FOR anxiety AND depression Pleas Koch, NP Taking Active   ezetimibe (ZETIA) 10 MG tablet [id number] Yes TAKE ONE TABLET BY MOUTH EVERY MORNING Stanford Breed Denice Bors, MD Taking Active   fluticasone (FLONASE) 50 MCG/ACT nasal spray [id number] Yes Place 2 sprays into both nostrils daily as needed for rhinitis or allergies. Pleas Koch, NP Taking  Active   furosemide (LASIX) 20 MG tablet [id number] Yes Take 1 tablet (20 mg total) by mouth every morning. Lelon Perla, MD Taking Active   furosemide (LASIX) 20 MG tablet [id number] Yes Take 1 tablet (20 mg total) by mouth as needed. If needed above your regular daily dose of furosemide Darylene Price A, [place] Taking Active   gabapentin (NEURONTIN) 100 MG capsule [id number] Yes TAKE 1 CAPSULE BY MOUTH THREE TIMES A DAY for anxiety. Pleas Koch, NP Taking Active   hydrocortisone 2.5 % cream [id number] Yes Apply to affected under breasts 1-2 times a day as directed and as needed for itch. Can also use on face as needed for itch Brendolyn Patty, MD Taking Active   JARDIANCE 10 MG TABS tablet [id number] Yes TAKE ONE TABLET BY MOUTH BEFORE BREAKFAST Alisa Graff, FNP Taking Active   ketoconazole (NIZORAL) 2 % cream [id number] Yes Apply to affected areas under breast twice daily for rash until healed. Can also use on face as needed for dermatitis Brendolyn Patty, MD Taking Active   metoprolol tartrate (LOPRESSOR) 25 MG tablet [id number] Yes TAKE THREE TABLETS BY MOUTH TWICE DAILY Stanford Breed Denice Bors, MD Taking Active   oxybutynin (DITROPAN) 5 MG tablet [id number] Yes Take 5 mg by mouth 2 (two) times daily. [provider] Taking Active   sotalol (BETAPACE) 120 MG tablet [id number] Yes TAKE ONE TABLET BY MOUTH TWICE DAILY Crenshaw, Denice Bors, MD Taking Active   spironolactone (ALDACTONE) 25 MG tablet [id number] Yes Take 1 tablet (25 mg total) by mouth daily. Lendon Colonel, NP Taking Active   tiZANidine (ZANAFLEX) 4 MG tablet [id number]  TAKE 2 TABLET (4 MG TOTAL) BY MOUTH DAILY FOR MUSCLE SPASMS AT BEDTIME. Pleas Koch, NP  Active             Patient Active Problem List   Diagnosis Date Noted   Hallucinations 11/24/2021   Abdominal pain 11/12/2021   Parkinson's disease ([place]) 07/29/2019   DDD (degenerative disc disease), lumbar 10/19/2018   Preventative health care 10/19/2018    Prediabetes 10/19/2018   (HFpEF) heart failure with preserved ejection fraction ([place]) 12/12/2017   Pericardial effusion with cardiac tamponade 11/13/2017   Paroxysmal atrial fibrillation ([place]) 11/05/2017   Anxiety 03/21/2016   Insomnia 07/17/2015   MI (mitral incompetence) 12/10/2014   Major depressive disorder 12/09/2014   OSA (obstructive sleep apnea) 12/09/2014   GERD (gastroesophageal reflux disease) 12/09/2014   Chronic diastolic CHF (congestive heart failure) ([place])    HLD (hyperlipidemia)    Osteoarthritis of both knees    S/P left TKA 08/19/2014   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 (BMI) 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 ([place]) 04/17/2013   History of ductal carcinoma in situ (DCIS) of breast 11/05/2012   Essential hypertension, benign 11/05/2012    Immunization History  Administered Date(s) Administered  Fluad Quad(high Dose 65+) 06/11/2019, 06/08/2020, 05/17/2021   Influenza, High Dose Seasonal PF 08/10/2014, 05/05/2015, 07/30/2018   Influenza,inj,Quad PF,6+ Mos 06/07/2016   PFIZER(Purple Top)SARS-COV-2 Vaccination 09/03/2019, 09/24/2019, 12/21/2020, 05/22/2021   Pneumococcal Conjugate-13 02/06/2016   Pneumococcal Polysaccharide-23 09/22/2014   Td 09/01/2018   Zoster Recombinat (Shingrix) 10/19/2018, 12/27/2019    Conditions to be addressed/monitored:  Hypertension, Hyperlipidemia, Atrial Fibrillation, Heart Failure, Depression, and Anxiety, Parkinson's Disease  Care Plan : [place]  Updates made by Charlton Haws, [place] since 01/12/2022 12:00 AM     Problem: Hypertension, Hyperlipidemia, Atrial Fibrillation, Heart Failure, Depression, and Anxiety, Parkinson's Disease   Priority: High     Long-Range Goal: Disease Management   Start Date: 12/24/2020  Expected End Date: 01/13/2023  This Visit's Progress: On track  Priority: High  Note:   Current Barriers:  Suboptimal self-monitoring  Pharmacist Clinical Goal(s):  Patient will achieve  adherence to monitoring guidelines and medication adherence to achieve therapeutic efficacy through collaboration with PharmD and provider.   Interventions: 1:1 collaboration with Pleas Koch, NP regarding development and update of comprehensive plan of care as evidenced by provider attestation and co-signature Inter-disciplinary care team collaboration (see longitudinal plan of care) Comprehensive medication review performed; medication list updated in electronic medical record  Hypertension / Heart Failure (BP goal <140/90) -Controlled - per clinic and home readings  -Last ejection fraction: 65-70% (Date: 05/2018) -HF type: Diastolic -Current treatment: Metoprolol tartrate 25 mg - 3 tab BID - Appropriate, Effective, Safe, Accessible Furosemide 20 mg daily + extra 20 mg PRN -Appropriate, Effective, Safe, Accessible Sotalol 120 mg BID -Appropriate, Effective, Safe, Accessible Spironolactone 25 mg daily -Appropriate, Effective, Safe, Accessible Diltiazem CD 180 mg daily -Appropriate, Effective, Safe, Accessible Jardiance 10 mg daily -Appropriate, Effective, Safe, Accessible -Medications previously tried: Lisinopril-HCTZ (stopped after metoprolol and sotalol were added for AFIB) -Educated on Symptoms of hypotension and importance of maintaining adequate hydration; -Recommended to continue current medication  Atrial Fibrillation (Goal: prevent stroke and major bleeding) -Controlled - pt is not on anticoagulation due to hx of hemorrhagic pericardial effusion after starting Xarelto; pt reports some fatigue -CHADSVASC: 5 -Current treatment: Metoprolol tartrate 25 mg - 3 tab BID - Appropriate, Effective, Query Safe Sotalol 120 mg BID -Appropriate, Effective, Query Safe Diltiazem CD 180 mg daily -Appropriate, Effective, Query Safe -Medications previously tried: Xarelto (hemorrhagic pericardial effusion) -Reviewed bradycardia risk - she is on 3 rate control agents that can potentially  cause bradycardia and has HR in low 50s in recent clinic visits; she does not endorse s/sx of bradycardia including shortness of breath, dizziness, chest pain but does endorses some fatigue  -Recommended to continue current medication; advised to monitor HR at home daily, contact cardiology if HR < 50 or s/sx of bradycardia  Hyperlipidemia: (LDL goal < 100) -Controlled - LDL 84 (12/2020), improved from 132 with ezetimibe (37% reduction) -Current treatment: OTC fish oil - 2 capsules daily - Appropriate, Effective, Safe, Accessible Ezetimibe 10 mg daily -Appropriate, Effective, Safe, Accessible -Medications previously tried: statin intolerant  -Educated on Cholesterol goals; Benefits of statin for ASCVD risk reduction; -Recommended to continue current medication  Pre-Diabetes (A1c goal <7%) -Diet-Controlled - A1c 5.9% -Recommended continue lifestyle management  Parkinson's Disease (Goal: manage symptoms, slow progression) -Controlled -Follows with neurology (Dr Melrose Nakayama) -Current treatment  Carbidopa-levodopa IR 25-100 mg - 2 6a, 1 9a, 1 12p, 2 3p, 1 6p, 2 9p - Appropriate, Effective, Safe, Accessible Carbidopa-levodopa CR 25-100 mg daily 9pm -Appropriate, Effective, Safe, Accessible -Medications  previously tried: n/a  -Recommended to continue current medication  Depression/Anxiety (Goal: manage symptoms) -Controlled - per pt report. She recently had issues with hallucinations/sleep disturbance and was seen by PCP and Neurology (UTI was ruled out, Neurology prescribed Seroquel but this was stopped quickly due to drug interactions with sotalol per cardiology); today pt does not endorse hallucinations and feels she is sleeping OK, doing well with just duloxetine -PHQ9: 3 (10/2021) - minimal depression -GAD7: 0 (09/2021) - minimal anxiety -Connected with Dr Melrose Nakayama (neurology) for mental health support -Current treatment: Aripiprazole 5 mg HS - not taking. Removed from list. Duloxetine 60 mg BID  - Appropriate, Effective, Safe, Accessible -Medications previously tried/failed: Abilify, Seroquel (DDI sotalol - Qtc) -Educated on Benefits of medication for symptom control -Recommended to continue current medication; advised pt to contact PCP or Dr Melrose Nakayama if hallucinations recur  Patient Goals/Self-Care Activities Patient will:  - take medications as prescribed as evidenced by patient report and record review focus on medication adherence by pill packs check blood pressure/pulse daily, document, and provide at future appointments engage in dietary modifications by limiting carbs and high-cholesterol foods      Medication Assistance: None required.  Patient affirms current coverage meets needs.  Compliance/Adherence/Medication fill history: Care Gaps: None  Star-Rating Drugs: Jardiance - PDC 97%  Medication Access: Within the past 30 days, how often has patient missed a dose of medication? 0 Is a pillbox or other method used to improve adherence? Yes  Factors that may affect medication adherence? no barriers identified Are meds synced by current pharmacy? Yes  Are meds delivered by current pharmacy? Yes  Does patient experience delays in picking up medications due to transportation concerns? No   [facility] Reviewed: Is patient disadvantaged to use [facility]?: No  Current Rx insurance plan: [employer] Name and location of Current pharmacy:  [facility] - [place], [place] - [address] [place] [address] Phone: [phone] Fax: [phone]  Packs 30-ds (last 12/31/21):  Oxybutynin 5 mg 1 tab at breakfast 1 tab at evening meal Sotalol 113m 1 tablet twice daily (1 breakfast and 1 evening meal) Tizanidine 486m2 tablets daily (2 bedtime) Ezetimibe 1068m tablet daily (breakfast) Gabapentin 100 mg 1 tablet at breakfast, 1 tablet at evening meal, 1 tab at bedtime  Spironolactone 52m55mblet 1 tablet  (breakfast) Jardiance 10 mg 1 tablet daily (before breakfast) Diltiazem 180mg31mablet daily (breakfast) Duloxetine 60 mg 1 tablet twice a day (1 breakfast, 1 evening meal) Metoprolol 52mg 24mblets twice a day (3 breakfast and 3 evening meal) Furosemide 20 mg - 1 tablet daily (breakfast) Carbidopa-Levodopa ER 25-100mg -100mablet at bedtime Vitamin D - 800 units - 1 tablet daily (breakfast)   VIAL medications: w/o safety caps Carbidopa-Levodopa 25-100mgTak65mtablets at 6 AM, 1 at 9AM, 1 tab at 12 PM,  2 at 3 PM, 1 at 6 PM and 2 at 9 PM  May take extra tab as need.  Gabapentin 100mg 1 c35mle three times a day (1 breakfast, 1 evening meal, 1 bedtime) May take 1 extra daily, PRN in vial.  Furosemide 20 mg - 1 tablet PRN above regular daily dose of Furosemide  Fluticasone Propionate 50 mcg Place 2 sprays into both nostrils daily   Care Plan and Follow Up Patient Decision:  Patient agrees to Care Plan and Follow-up.  Plan: Telephone follow up appointment with care management team member scheduled for:  3 months  Charlene Brooke, PharmD, BCACP Clinical Pharmacist [facility] at [facility] [phone]

## 2022-01-06 ENCOUNTER — Ambulatory Visit (INDEPENDENT_AMBULATORY_CARE_PROVIDER_SITE_OTHER): Payer: Medicare HMO

## 2022-01-06 VITALS — Ht 62.0 in | Wt 155.0 lb

## 2022-01-06 DIAGNOSIS — Z0000 Encounter for general adult medical examination without abnormal findings: Secondary | ICD-10-CM | POA: Diagnosis not present

## 2022-01-06 NOTE — Patient Instructions (Signed)
Selena Bush , Thank you for taking time to come for your Medicare Wellness Visit. I appreciate your ongoing commitment to your health goals. Please review the following plan we discussed and let me know if I can assist you in the future.   Screening recommendations/referrals: Colonoscopy: completed 02/10/2021 Mammogram: scheduled for 01/12/2022 Bone Density: n/a Recommended yearly ophthalmology/optometry visit for glaucoma screening and checkup Recommended yearly dental visit for hygiene and checkup  Vaccinations: Influenza vaccine: due 03/15/2022 Pneumococcal vaccine: completed 02/06/2016 Tdap vaccine: completed 09/01/2018, due 09/01/2028 Shingles vaccine: completed   Covid-19: 05/19/2021, 12/21/2020, 09/24/2019, 09/03/2019  Advanced directives: Advance directive discussed with you today. .  Conditions/risks identified: none  Next appointment: Follow up in one year for your annual wellness visit    Preventive Care 74 Years and Older, Female Preventive care refers to lifestyle choices and visits with your health care provider that can promote health and wellness. What does preventive care include? A yearly physical exam. This is also called an annual well check. Dental exams once or twice a year. Routine eye exams. Ask your health care provider how often you should have your eyes checked. Personal lifestyle choices, including: Daily care of your teeth and gums. Regular physical activity. Eating a healthy diet. Avoiding tobacco and drug use. Limiting alcohol use. Practicing safe sex. Taking low-dose aspirin every day. Taking vitamin and mineral supplements as recommended by your health care provider. What happens during an annual well check? The services and screenings done by your health care provider during your annual well check will depend on your age, overall health, lifestyle risk factors, and family history of disease. Counseling  Your health care provider may ask you questions about  your: Alcohol use. Tobacco use. Drug use. Emotional well-being. Home and relationship well-being. Sexual activity. Eating habits. History of falls. Memory and ability to understand (cognition). Work and work Statistician. Reproductive health. Screening  You may have the following tests or measurements: Height, weight, and BMI. Blood pressure. Lipid and cholesterol levels. These may be checked every 5 years, or more frequently if you are over 36 years old. Skin check. Lung cancer screening. You may have this screening every year starting at age 64 if you have a 30-pack-year history of smoking and currently smoke or have quit within the past 15 years. Fecal occult blood Bush (FOBT) of the stool. You may have this Bush every year starting at age 70. Flexible sigmoidoscopy or colonoscopy. You may have a sigmoidoscopy every 5 years or a colonoscopy every 10 years starting at age 61. Hepatitis C blood Bush. Hepatitis B blood Bush. Sexually transmitted disease (STD) testing. Diabetes screening. This is done by checking your blood sugar (glucose) after you have not eaten for a while (fasting). You may have this done every 1-3 years. Bone density scan. This is done to screen for osteoporosis. You may have this done starting at age 33. Mammogram. This may be done every 1-2 years. Talk to your health care provider about how often you should have regular mammograms. Talk with your health care provider about your Bush results, treatment options, and if necessary, the need for more tests. Vaccines  Your health care provider may recommend certain vaccines, such as: Influenza vaccine. This is recommended every year. Tetanus, diphtheria, and acellular pertussis (Tdap, Td) vaccine. You may need a Td booster every 10 years. Zoster vaccine. You may need this after age 39. Pneumococcal 13-valent conjugate (PCV13) vaccine. One dose is recommended after age 86. Pneumococcal polysaccharide (PPSV23) vaccine.  One dose is recommended after age 55. Talk to your health care provider about which screenings and vaccines you need and how often you need them. This information is not intended to replace advice given to you by your health care provider. Make sure you discuss any questions you have with your health care provider. Document Released: 08/28/2015 Document Revised: 04/20/2016 Document Reviewed: 06/02/2015 Elsevier Interactive Patient Education  2017 [employer] Prevention in the Home Falls can cause injuries. They can happen to people of all ages. There are many things you can do to make your home safe and to help prevent falls. What can I do on the outside of my home? Regularly fix the edges of walkways and driveways and fix any cracks. Remove anything that might make you trip as you walk through a door, such as a raised step or threshold. Trim any bushes or trees on the path to your home. Use bright outdoor lighting. Clear any walking paths of anything that might make someone trip, such as rocks or tools. Regularly check to see if handrails are loose or broken. Make sure that both sides of any steps have handrails. Any raised decks and porches should have guardrails on the edges. Have any leaves, snow, or ice cleared regularly. Use sand or salt on walking paths during winter. Clean up any spills in your garage right away. This includes oil or grease spills. What can I do in the bathroom? Use night lights. Install grab bars by the toilet and in the tub and shower. Do not use towel bars as grab bars. Use non-skid mats or decals in the tub or shower. If you need to sit down in the shower, use a plastic, non-slip stool. Keep the floor dry. Clean up any water that spills on the floor as soon as it happens. Remove soap buildup in the tub or shower regularly. Attach bath mats securely with double-sided non-slip rug tape. Do not have throw rugs and other things on the floor that can make  you trip. What can I do in the bedroom? Use night lights. Make sure that you have a light by your bed that is easy to reach. Do not use any sheets or blankets that are too big for your bed. They should not hang down onto the floor. Have a firm chair that has side arms. You can use this for support while you get dressed. Do not have throw rugs and other things on the floor that can make you trip. What can I do in the kitchen? Clean up any spills right away. Avoid walking on wet floors. Keep items that you use a lot in easy-to-reach places. If you need to reach something above you, use a strong step stool that has a grab bar. Keep electrical cords out of the way. Do not use floor polish or wax that makes floors slippery. If you must use wax, use non-skid floor wax. Do not have throw rugs and other things on the floor that can make you trip. What can I do with my stairs? Do not leave any items on the stairs. Make sure that there are handrails on both sides of the stairs and use them. Fix handrails that are broken or loose. Make sure that handrails are as long as the stairways. Check any carpeting to make sure that it is firmly attached to the stairs. Fix any carpet that is loose or worn. Avoid having throw rugs at the top or bottom of the  stairs. If you do have throw rugs, attach them to the floor with carpet tape. Make sure that you have a light switch at the top of the stairs and the bottom of the stairs. If you do not have them, ask someone to add them for you. What else can I do to help prevent falls? Wear shoes that: Do not have high heels. Have rubber bottoms. Are comfortable and fit you well. Are closed at the toe. Do not wear sandals. If you use a stepladder: Make sure that it is fully opened. Do not climb a closed stepladder. Make sure that both sides of the stepladder are locked into place. Ask someone to hold it for you, if possible. Clearly mark and make sure that you can  see: Any grab bars or handrails. First and last steps. Where the edge of each step is. Use tools that help you move around (mobility aids) if they are needed. These include: Canes. Walkers. Scooters. Crutches. Turn on the lights when you go into a dark area. Replace any light bulbs as soon as they burn out. Set up your furniture so you have a clear path. Avoid moving your furniture around. If any of your floors are uneven, fix them. If there are any pets around you, be aware of where they are. Review your medicines with your doctor. Some medicines can make you feel dizzy. This can increase your chance of falling. Ask your doctor what other things that you can do to help prevent falls. This information is not intended to replace advice given to you by your health care provider. Make sure you discuss any questions you have with your health care provider. Document Released: 05/28/2009 Document Revised: 01/07/2016 Document Reviewed: 09/05/2014 Elsevier Interactive Patient Education  2017 [employer].

## 2022-01-06 NOTE — Progress Notes (Signed)
I connected with [name] today by telephone and verified that I am speaking with the correct person using two identifiers. Location patient: home Location provider: work Persons participating in the virtual visit: [name], Glean Hess (caregiver), Glenna Durand LPN.   I discussed the limitations, risks, security and privacy concerns of performing an evaluation and management service by telephone and the availability of in person appointments. I also discussed with the patient that there may be a patient responsible charge related to this service. The patient expressed understanding and verbally consented to this telephonic visit.    Interactive audio and video telecommunications were attempted between this provider and patient, however failed, due to patient having technical difficulties OR patient did not have access to video capability.  We continued and completed visit with audio only.     Vital signs may be patient reported or missing.  Subjective:   Selena Bush is a 76 y.o. female who presents for Medicare Annual (Subsequent) preventive examination.  Review of Systems     Cardiac Risk Factors include: advanced age (>86mn, >>8women);dyslipidemia;hypertension     Objective:    Today's Vitals   01/06/22 1129  Weight: 155 lb (70.3 kg)  Height: '5\' 2"'$  (1.575 m)   Body mass index is 28.35 kg/m.     01/06/2022   11:39 AM 06/05/2021    5:18 PM 02/10/2021    7:50 AM 01/18/2020   12:58 AM 08/31/2018   10:51 PM 08/12/2018   10:00 AM 01/10/2018   12:15 PM  Advanced Directives  Does Patient Have a Medical Advance Directive? No No No No No No No  Would patient like information on creating a medical advance directive?  No - Patient declined No - Patient declined   No - Patient declined No - Patient declined    Current Medications (verified) Outpatient Encounter Medications as of 01/06/2022  Medication Sig   acetaminophen (TYLENOL) 500 MG tablet Take by mouth.    carbidopa-levodopa (SINEMET IR) 25-100 MG tablet Take 2 tablets at 6 AM, 1 at 9AM, 1 tab at 12 PM,  2 at 3 PM, 1 at 6 PM and 2 at 9 PM.   Carbidopa-Levodopa ER (SINEMET CR) 25-100 MG tablet controlled release Take 1 tab at 9pm   Cholecalciferol (VITAMIN D-3) 25 MCG (1000 UT) CAPS Take 1 capsule by mouth daily.   Cyanocobalamin (VITAMIN B-12) 5000 MCG SUBL Place 1 each under the tongue daily.   diltiazem (CARDIZEM CD) 180 MG 24 hr capsule TAKE ONE CAPSULE BY MOUTH EVERY MORNING   DULoxetine (CYMBALTA) 60 MG capsule TAKE ONE CAPSULE BY MOUTH TWICE DAILY FOR anxiety AND depression   ezetimibe (ZETIA) 10 MG tablet TAKE ONE TABLET BY MOUTH EVERY MORNING   fluticasone (FLONASE) 50 MCG/ACT nasal spray Place 2 sprays into both nostrils daily as needed for rhinitis or allergies.   furosemide (LASIX) 20 MG tablet Take 1 tablet (20 mg total) by mouth every morning.   furosemide (LASIX) 20 MG tablet Take 1 tablet (20 mg total) by mouth as needed. If needed above your regular daily dose of furosemide   gabapentin (NEURONTIN) 100 MG capsule TAKE 1 CAPSULE BY MOUTH THREE TIMES A DAY for anxiety.   hydrocortisone 2.5 % cream Apply to affected under breasts 1-2 times a day as directed and as needed for itch. Can also use on face as needed for itch   JARDIANCE 10 MG TABS tablet TAKE ONE TABLET BY MOUTH BEFORE BREAKFAST   ketoconazole (NIZORAL) 2 %  cream Apply to affected areas under breast twice daily for rash until healed. Can also use on face as needed for dermatitis   metoprolol tartrate (LOPRESSOR) 25 MG tablet TAKE THREE TABLETS BY MOUTH TWICE DAILY   oxybutynin (DITROPAN) 5 MG tablet Take 5 mg by mouth 2 (two) times daily.   sotalol (BETAPACE) 120 MG tablet TAKE ONE TABLET BY MOUTH TWICE DAILY   spironolactone (ALDACTONE) 25 MG tablet Take 1 tablet (25 mg total) by mouth daily.   tiZANidine (ZANAFLEX) 4 MG tablet TAKE 2 TABLET (4 MG TOTAL) BY MOUTH DAILY FOR MUSCLE SPASMS AT BEDTIME.   No  facility-administered encounter medications on file as of 01/06/2022.    Allergies (verified) Penicillins, Sulfa antibiotics, Codeine, Statins, and Aspirin   History: Past Medical History:  Diagnosis Date   Anxiety    Atrial fibrillation ([place] [facility])    Cat bite of right hand 03/25/2019   Chronic diastolic CHF (congestive heart failure) ([place])    a. echo 09/2014: EF 78-67%, diastolic dysfunction, mild LVH, nl RV size & systolic function, mildly dilated LA (4.3 cm), mild MR/TR, mildly elevated PASP 36.7 mm Hg   DDD (degenerative disc disease), cervical    DDD (degenerative disc disease), lumbar    Depression    Diffuse cystic mastopathy 2014   Dyspnea    Dysrhythmia    GERD (gastroesophageal reflux disease)    Gout    Headache    rare   HLD (hyperlipidemia)    a. statin intolerant 2/2 myalgias   Hx of dysplastic nevus 12/25/2018   L medial ankle   Hypercholesterolemia    Hypertension    Malignant neoplasm of upper-outer quadrant of female breast ([place]) 10/2012   Papillary DCIS, sentinel node negative. DR/PR positive. PARTIAL RIGHT MASTECTOMY FOR BREAST CANCER--HAD RADIATION - NO CHEMO --DR. CRYSTAL [place] ONCOLOGIST   Obesity    OSA on CPAP    Osteoarthritis of both knees    a. s/p right TKA 04/2013 & left TKA 09/2014   Otitis externa    Parkinson disease [facility])    Personal history of radiation therapy 2015   RIGHT breast-mammosite per pt   Pneumonia of both lungs due to infectious organism 01/08/2021   Sleep difficulties    LUNESTA HAS HELPED   Vaginal cyst    Past Surgical History:  Procedure Laterality Date   ABDOMINAL HYSTERECTOMY  1992   DUB; fibroids; endometriosis.  One remaining ovary.     BREAST LUMPECTOMY Right 2015   Papillary DCIS, sentinel node negative. DR/PR positive. PARTIAL RIGHT MASTECTOMY FOR BREAST CANCER--HAD RADIATION - NO CHEMO --DR. CRYSTAL [place] ONCOLOGIST   BREAST SURGERY Right 10/2012   Wide excision,APB RT 10 mm papillary DCIS, ER/PR positive.  Sentinel node negative. Partial breast radiation.   CATARACT EXTRACTION, BILATERAL  02/13/2016   [facility].   CHOLECYSTECTOMY  1994   COLONOSCOPY  2015   1 benign polyp-every 5 years/ Dr Candace Cruise   COLONOSCOPY WITH PROPOFOL N/A 02/10/2021   Procedure: COLONOSCOPY WITH PROPOFOL;  Surgeon: Robert Bellow, MD;  Location: [facility] ENDOSCOPY;  Service: Endoscopy;  Laterality: N/A;   ERCP  1995   JOINT REPLACEMENT Right 04/2013   knee   PERICARDIOCENTESIS N/A 11/13/2017   Procedure: PERICARDIOCENTESIS;  Surgeon: Nelva Bush, MD;  Location: [place] CV LAB;  Service: Cardiovascular;  Laterality: N/A;   TOTAL KNEE ARTHROPLASTY Right 04/16/2013   Procedure: RIGHT TOTAL KNEE ARTHROPLASTY;  Surgeon: Mauri Pole, MD;  Location: [facility] ORS;  Service: Orthopedics;  Laterality: Right;  TOTAL KNEE ARTHROPLASTY Left 09/15/2014   Procedure: LEFT TOTAL KNEE ARTHROPLASTY;  Surgeon: Mauri Pole, MD;  Location: [facility] ORS;  Service: Orthopedics;  Laterality: Left;   TUBAL LIGATION  1979   UPPER GI ENDOSCOPY     Family History  Problem Relation Age of Onset   Colon cancer Mother    Cancer Mother 28       colon cancer   Melanoma Father    Cancer Father 9       melanoma   Colon cancer Maternal Grandfather    Breast cancer Maternal Grandfather    Melanoma Sister    Melanoma Sister    Social History   Socioeconomic History   Marital status: Widowed    Spouse name: Chrissie Noa   Number of children: 2   Years of education: College   Highest education level: Bachelor's degree (e.g., BA, AB, BS)  Occupational History   Occupation: Retired  Tobacco Use   Smoking status: Never   Smokeless tobacco: Never   Tobacco comments:    social smoker as a teen  Scientific laboratory technician Use: Never used  Substance and Sexual Activity   Alcohol use: No   Drug use: No   Sexual activity: Not Currently    Birth control/protection: Post-menopausal, Surgical  Other Topics Concern   Not on file  Social History Narrative    Marital status: widowed since 09/16/2015      Children: 2 children (55, 74); 5 grandchild (4 in [place]; 1 in [place]).      Lives: alone in townhome; 1 dog, 2 cats      Employment: psychiatric Education officer, museum; retired in 2015      Tobacco: teenager only      Alcohol:  None      Drugs: none      Exercise: walking in 2019; more active in 2019.  Walking daily small amounts.        ADLs: drives; independent with ADLs; no assistant devices      Advanced Directives: none; FULL CODE; no prolonged measures.   Does not need them.  Daughter/Heather [place] Deck is HCPOA.    Social Determinants of Health   Financial Resource Strain: Low Risk    Difficulty of Paying Living Expenses: Not hard at all  Food Insecurity: No Food Insecurity   Worried About Charity fundraiser in the Last Year: Never true   [place] in the Last Year: Never true  Transportation Needs: No Transportation Needs   Lack of Transportation (Medical): No   Lack of Transportation (Non-Medical): No  Physical Activity: Insufficiently Active   Days of Exercise per Week: 7 days   Minutes of Exercise per Session: 10 min  Stress: No Stress Concern Present   Feeling of Stress : Not at all  Social Connections: Not on file    Tobacco Counseling Counseling given: Not Answered Tobacco comments: social smoker as a teen   Clinical Intake:  Pre-visit preparation completed: Yes  Pain : No/denies pain     Nutritional Status: BMI 25 -29 Overweight Nutritional Risks: None Diabetes: No  How often do you need to have someone help you when you read instructions, pamphlets, or other written materials from your doctor or pharmacy?: 1 - Never What is the last grade level you completed in school?: college  Diabetic? no  Interpreter Needed?: No  Information entered by :: NAllen LPN   Activities of Daily Living    01/06/2022   11:41 AM  11/25/2021    2:51 PM  In your present state of health, do you have any difficulty performing the  following activities:  Hearing? 0 0  Vision? 0 0  Difficulty concentrating or making decisions? 1 0  Walking or climbing stairs? 1 0  Dressing or bathing? 1 0  Doing errands, shopping? 1 0  Preparing Food and eating ? Y   Using the Toilet? N   In the past six months, have you accidently leaked urine? Y   Do you have problems with loss of bowel control? N   Managing your Medications? Y   Managing your Finances? Y   Housekeeping or managing your Housekeeping? Y     Patient Care Team: Pleas Koch, NP as PCP - General (Internal Medicine) Stanford Breed Denice Bors, MD as PCP - Cardiology (Cardiology) Bary Castilla Forest Gleason, MD (General Surgery) Corey Harold, MD as Consulting Physician Charlton Haws, [facility] as Pharmacist (Pharmacist)  Indicate any recent Medical Services you may have received from other than [facility] providers in the past year (date may be approximate).     Assessment:   This is a routine wellness examination for [place].  Hearing/Vision screen Vision Screening - Comments:: Regular eye exams, [place]  Dietary issues and exercise activities discussed: Current Exercise Habits: Home exercise routine, Type of exercise: walking, Time (Minutes): 10, Frequency (Times/Week): 7, Weekly Exercise (Minutes/Week): 70   Goals Addressed             This Visit's Progress    Patient Stated       01/06/2022, to be more independent       Depression Screen    01/06/2022   11:41 AM 11/12/2021   11:48 AM 11/12/2021   11:47 AM 06/24/2021   10:35 AM 06/22/2021    2:49 PM 06/10/2021    1:53 PM 06/08/2021    8:49 AM  PHQ 2/9 Scores  PHQ - 2 Score 0 1 0 0 0 0 0  PHQ- 9 Score  3   2      Fall Risk    01/06/2022   11:40 AM 11/25/2021    2:51 PM 11/12/2021   11:47 AM 10/11/2021   12:05 PM 09/06/2021    2:18 PM  Fall Risk   Falls in the past year? '1 1 1 '$ 0 0  Comment lost balance      Number falls in past yr: 1 1 0 0 0  Injury with Fall? 0 1 1 0 0  Comment   hit  head    Risk for fall due to : Impaired balance/gait;Impaired mobility;Medication side effect Impaired balance/gait;Impaired mobility;History of fall(s) Impaired balance/gait Impaired balance/gait;Impaired mobility   Follow up Falls evaluation completed;Education provided;Falls prevention discussed Falls evaluation completed Falls evaluation completed Falls evaluation completed Falls evaluation completed    FALL RISK PREVENTION PERTAINING TO THE HOME:  Any stairs in or around the home? Yes  If so, are there any without handrails? No  Home free of loose throw rugs in walkways, pet beds, electrical cords, etc? Yes  Adequate lighting in your home to reduce risk of falls? Yes   ASSISTIVE DEVICES UTILIZED TO PREVENT FALLS:  Life alert? No  Use of a cane, walker or w/c? Yes  Grab bars in the bathroom? Yes  Shower chair or bench in shower? Yes  Elevated toilet seat or a handicapped toilet? Yes   TIMED UP AND GO:  Was the test performed? No .  Cognitive Function:        01/06/2022   11:43 AM 10/02/2017    9:46 AM  6CIT Screen  What Year? 0 points 0 points  What month? 0 points 0 points  What time? 0 points 0 points  Count back from 20 0 points 0 points  Months in reverse 0 points 0 points  Repeat phrase 0 points 0 points  Total Score 0 points 0 points    Immunizations Immunization History  Administered Date(s) Administered   Fluad Quad(high Dose 65+) 06/11/2019, 06/08/2020, 05/17/2021   Influenza, High Dose Seasonal PF 08/10/2014, 05/05/2015, 07/30/2018   Influenza,inj,Quad PF,6+ Mos 06/07/2016   PFIZER(Purple Top)SARS-COV-2 Vaccination 09/03/2019, 09/24/2019, 12/21/2020, 05/22/2021   Pneumococcal Conjugate-13 02/06/2016   Pneumococcal Polysaccharide-23 09/22/2014   Td 09/01/2018   Zoster Recombinat (Shingrix) 10/19/2018, 12/27/2019    TDAP status: Up to date  Flu Vaccine status: Up to date  Pneumococcal vaccine status: Up to date  Covid-19 vaccine status:  Completed vaccines  Qualifies for Shingles Vaccine? Yes   Zostavax completed Yes   Shingrix Completed?: Yes  Screening Tests Health Maintenance  Topic Date Due   URINE MICROALBUMIN  Never done   COVID-19 Vaccine (5 - Booster for Pfizer series) 07/17/2021   INFLUENZA VACCINE  03/15/2022   MAMMOGRAM  12/01/2022   TETANUS/TDAP  09/01/2028   COLONOSCOPY (Pts 45-48yr Insurance coverage will need to be confirmed)  02/11/2031   Pneumonia Vaccine 76 Years old  Completed   Hepatitis C Screening  Completed   Zoster Vaccines- Shingrix  Completed   HPV VACCINES  Aged Out   DEXA SCAN  Discontinued    Health Maintenance  Health Maintenance Due  Topic Date Due   URINE MICROALBUMIN  Never done   COVID-19 Vaccine (5 - Booster for [place]) 07/17/2021    Colorectal cancer screening: Type of screening: Colonoscopy. Completed 02/10/2021. Repeat every 10 years  Mammogram status: scheduled for 01/12/2022  Bone Density status: n/a  Lung Cancer Screening: (Low Dose CT Chest recommended if Age 74-80years, 30 pack-year currently smoking OR have quit w/in 15years.) does not qualify.   Lung Cancer Screening Referral: no  Additional Screening:  Hepatitis C Screening: does qualify; Completed 06/07/2016  Vision Screening: Recommended annual ophthalmology exams for early detection of glaucoma and other disorders of the eye. Is the patient up to date with their annual eye exam?  Yes  Who is the provider or what is the name of the office in which the patient attends annual eye exams? [place] is not established with a provider, would they like to be referred to a provider to establish care? No .   Dental Screening: Recommended annual dental exams for proper oral hygiene  Community Resource Referral / Chronic Care Management: CRR required this visit?  No   CCM required this visit?  No      Plan:     I have personally reviewed and noted the following in the  patient's chart:   Medical and social history Use of alcohol, tobacco or illicit drugs  Current medications and supplements including opioid prescriptions.  Functional ability and status Nutritional status Physical activity Advanced directives List of other physicians Hospitalizations, surgeries, and ER visits in previous 12 months Vitals Screenings to include cognitive, depression, and falls Referrals and appointments  In addition, I have reviewed and discussed with patient certain preventive protocols, quality metrics, and best practice recommendations. A written personalized care plan for preventive services as well as general  preventive health recommendations were provided to patient.     Kellie Simmering, LPN   03/22/8109   Nurse Notes: none  Due to this being a virtual visit, the after visit summary with patients personalized plan was offered to patient via mail or my-chart.  Patient would like to access on my-chart/

## 2022-01-11 DIAGNOSIS — I4891 Unspecified atrial fibrillation: Secondary | ICD-10-CM | POA: Diagnosis not present

## 2022-01-11 DIAGNOSIS — R69 Illness, unspecified: Secondary | ICD-10-CM | POA: Diagnosis not present

## 2022-01-11 DIAGNOSIS — Z9181 History of falling: Secondary | ICD-10-CM | POA: Diagnosis not present

## 2022-01-11 DIAGNOSIS — M5030 Other cervical disc degeneration, unspecified cervical region: Secondary | ICD-10-CM | POA: Diagnosis not present

## 2022-01-11 DIAGNOSIS — Z853 Personal history of malignant neoplasm of breast: Secondary | ICD-10-CM | POA: Diagnosis not present

## 2022-01-11 DIAGNOSIS — M1712 Unilateral primary osteoarthritis, left knee: Secondary | ICD-10-CM | POA: Diagnosis not present

## 2022-01-11 DIAGNOSIS — G2581 Restless legs syndrome: Secondary | ICD-10-CM | POA: Diagnosis not present

## 2022-01-11 DIAGNOSIS — I110 Hypertensive heart disease with heart failure: Secondary | ICD-10-CM | POA: Diagnosis not present

## 2022-01-11 DIAGNOSIS — M109 Gout, unspecified: Secondary | ICD-10-CM | POA: Diagnosis not present

## 2022-01-11 DIAGNOSIS — Z7984 Long term (current) use of oral hypoglycemic drugs: Secondary | ICD-10-CM | POA: Diagnosis not present

## 2022-01-11 DIAGNOSIS — I509 Heart failure, unspecified: Secondary | ICD-10-CM | POA: Diagnosis not present

## 2022-01-11 DIAGNOSIS — M5136 Other intervertebral disc degeneration, lumbar region: Secondary | ICD-10-CM | POA: Diagnosis not present

## 2022-01-11 DIAGNOSIS — G4733 Obstructive sleep apnea (adult) (pediatric): Secondary | ICD-10-CM | POA: Diagnosis not present

## 2022-01-11 DIAGNOSIS — E669 Obesity, unspecified: Secondary | ICD-10-CM | POA: Diagnosis not present

## 2022-01-11 DIAGNOSIS — G20 Parkinson's disease: Secondary | ICD-10-CM | POA: Diagnosis not present

## 2022-01-11 DIAGNOSIS — K219 Gastro-esophageal reflux disease without esophagitis: Secondary | ICD-10-CM | POA: Diagnosis not present

## 2022-01-12 ENCOUNTER — Ambulatory Visit
Admission: RE | Admit: 2022-01-12 | Discharge: 2022-01-12 | Disposition: A | Discharge status: Home / Self Care | Payer: Medicare HMO | Source: Ambulatory Visit | Attending: General Surgery | Admitting: General Surgery

## 2022-01-12 DIAGNOSIS — I509 Heart failure, unspecified: Secondary | ICD-10-CM

## 2022-01-12 DIAGNOSIS — Z86000 Personal history of in-situ neoplasm of breast: Secondary | ICD-10-CM | POA: Diagnosis not present

## 2022-01-12 DIAGNOSIS — E785 Hyperlipidemia, unspecified: Secondary | ICD-10-CM

## 2022-01-12 DIAGNOSIS — G20 Parkinson's disease: Secondary | ICD-10-CM

## 2022-01-12 DIAGNOSIS — F419 Anxiety disorder, unspecified: Secondary | ICD-10-CM

## 2022-01-12 DIAGNOSIS — I110 Hypertensive heart disease with heart failure: Secondary | ICD-10-CM

## 2022-01-12 DIAGNOSIS — Z1231 Encounter for screening mammogram for malignant neoplasm of breast: Secondary | ICD-10-CM | POA: Diagnosis not present

## 2022-01-12 DIAGNOSIS — I4891 Unspecified atrial fibrillation: Secondary | ICD-10-CM

## 2022-01-12 NOTE — Patient Instructions (Signed)
Visit Information  Phone number for Pharmacist: [phone]   Goals Addressed   None     Care Plan : [place]  Updates made by Charlton Haws, [place] since 01/12/2022 12:00 AM     Problem: Hypertension, Hyperlipidemia, Atrial Fibrillation, Heart Failure, Depression, and Anxiety, Parkinson's Disease   Priority: High     Long-Range Goal: Disease Management   Start Date: 12/24/2020  Expected End Date: 01/13/2023  This Visit's Progress: On track  Priority: High  Note:   Current Barriers:  Suboptimal self-monitoring  Pharmacist Clinical Goal(s):  Patient will achieve adherence to monitoring guidelines and medication adherence to achieve therapeutic efficacy through collaboration with PharmD and provider.   Interventions: 1:1 collaboration with Pleas Koch, NP regarding development and update of comprehensive plan of care as evidenced by provider attestation and co-signature Inter-disciplinary care team collaboration (see longitudinal plan of care) Comprehensive medication review performed; medication list updated in electronic medical record  Hypertension / Heart Failure (BP goal <140/90) -Controlled - per clinic and home readings  -Last ejection fraction: 65-70% (Date: 05/2018) -HF type: Diastolic -Current treatment: Metoprolol tartrate 25 mg - 3 tab BID - Appropriate, Effective, Safe, Accessible Furosemide 20 mg daily + extra 20 mg PRN -Appropriate, Effective, Safe, Accessible Sotalol 120 mg BID -Appropriate, Effective, Safe, Accessible Spironolactone 25 mg daily -Appropriate, Effective, Safe, Accessible Diltiazem CD 180 mg daily -Appropriate, Effective, Safe, Accessible Jardiance 10 mg daily -Appropriate, Effective, Safe, Accessible -Medications previously tried: Lisinopril-HCTZ (stopped after metoprolol and sotalol were added for AFIB) -Educated on Symptoms of hypotension and importance of maintaining adequate hydration; -Recommended to continue  current medication  Atrial Fibrillation (Goal: prevent stroke and major bleeding) -Controlled - pt is not on anticoagulation due to hx of hemorrhagic pericardial effusion after starting Xarelto; pt reports some fatigue -CHADSVASC: 5 -Current treatment: Metoprolol tartrate 25 mg - 3 tab BID - Appropriate, Effective, Query Safe Sotalol 120 mg BID -Appropriate, Effective, Query Safe Diltiazem CD 180 mg daily -Appropriate, Effective, Query Safe -Medications previously tried: Xarelto (hemorrhagic pericardial effusion) -Reviewed bradycardia risk - she is on 3 rate control agents that can potentially cause bradycardia and has HR in low 50s in recent clinic visits; she does not endorse s/sx of bradycardia including shortness of breath, dizziness, chest pain but does endorses some fatigue  -Recommended to continue current medication; advised to monitor HR at home daily, contact cardiology if HR < 50 or s/sx of bradycardia  Hyperlipidemia: (LDL goal < 100) -Controlled - LDL 84 (12/2020), improved from 132 with ezetimibe (37% reduction) -Current treatment: OTC fish oil - 2 capsules daily - Appropriate, Effective, Safe, Accessible Ezetimibe 10 mg daily -Appropriate, Effective, Safe, Accessible -Medications previously tried: statin intolerant  -Educated on Cholesterol goals; Benefits of statin for ASCVD risk reduction; -Recommended to continue current medication  Pre-Diabetes (A1c goal <7%) -Diet-Controlled - A1c 5.9% -Recommended continue lifestyle management  Parkinson's Disease (Goal: manage symptoms, slow progression) -Controlled -Follows with neurology (Dr Melrose Nakayama) -Current treatment  Carbidopa-levodopa IR 25-100 mg - 2 6a, 1 9a, 1 12p, 2 3p, 1 6p, 2 9p - Appropriate, Effective, Safe, Accessible Carbidopa-levodopa CR 25-100 mg daily 9pm -Appropriate, Effective, Safe, Accessible -Medications previously tried: n/a  -Recommended to continue current medication  Depression/Anxiety (Goal: manage  symptoms) -Controlled - per pt report. She recently had issues with hallucinations/sleep disturbance and was seen by PCP and Neurology (UTI was ruled out, Neurology prescribed Seroquel but this was stopped quickly due to drug interactions with sotalol per cardiology); today  pt does not endorse hallucinations and feels she is sleeping OK, doing well with just duloxetine -PHQ9: 3 (10/2021) - minimal depression -GAD7: 0 (09/2021) - minimal anxiety -Connected with Dr Melrose Nakayama (neurology) for mental health support -Current treatment: Aripiprazole 5 mg HS - not taking. Removed from list. Duloxetine 60 mg BID - Appropriate, Effective, Safe, Accessible -Medications previously tried/failed: Abilify, Seroquel (DDI sotalol - Qtc) -Educated on Benefits of medication for symptom control -Recommended to continue current medication; advised pt to contact PCP or Dr Melrose Nakayama if hallucinations recur  Patient Goals/Self-Care Activities Patient will:  - take medications as prescribed as evidenced by patient report and record review focus on medication adherence by pill packs check blood pressure/pulse daily, document, and provide at future appointments engage in dietary modifications by limiting carbs and high-cholesterol foods      Patient verbalizes understanding of instructions and care plan provided today and agrees to view in [place]. Active MyChart status and patient understanding of how to access instructions and care plan via MyChart confirmed with patient.    Telephone follow up appointment with pharmacy team member scheduled for: 3 months  Charlene Brooke, PharmD, [facility] Clinical Pharmacist [facility] [phone]

## 2022-01-13 DIAGNOSIS — R296 Repeated falls: Secondary | ICD-10-CM | POA: Diagnosis not present

## 2022-01-13 DIAGNOSIS — R262 Difficulty in walking, not elsewhere classified: Secondary | ICD-10-CM | POA: Diagnosis not present

## 2022-01-13 DIAGNOSIS — R251 Tremor, unspecified: Secondary | ICD-10-CM | POA: Diagnosis not present

## 2022-01-13 DIAGNOSIS — G2581 Restless legs syndrome: Secondary | ICD-10-CM | POA: Diagnosis not present

## 2022-01-13 DIAGNOSIS — M25642 Stiffness of left hand, not elsewhere classified: Secondary | ICD-10-CM | POA: Diagnosis not present

## 2022-01-13 DIAGNOSIS — R29898 Other symptoms and signs involving the musculoskeletal system: Secondary | ICD-10-CM | POA: Diagnosis not present

## 2022-01-13 DIAGNOSIS — R69 Illness, unspecified: Secondary | ICD-10-CM | POA: Diagnosis not present

## 2022-01-17 DIAGNOSIS — R69 Illness, unspecified: Secondary | ICD-10-CM | POA: Diagnosis not present

## 2022-01-17 DIAGNOSIS — F330 Major depressive disorder, recurrent, mild: Secondary | ICD-10-CM | POA: Diagnosis not present

## 2022-01-17 DIAGNOSIS — F4312 Post-traumatic stress disorder, chronic: Secondary | ICD-10-CM | POA: Diagnosis not present

## 2022-01-19 ENCOUNTER — Telehealth: Payer: Self-pay

## 2022-01-19 NOTE — Progress Notes (Signed)
Chronic Care Management Pharmacy Assistant   Name: Selena Bush  MRN: [id number] DOB: 09/20/1945  Reason for Encounter: CCM (Medication Adherence and Delivery Coordination)    Recent office visits:  01/06/22 AWV  Recent consult visits:  01/13/22 Gurney Maxin, MD (Neurology) Finger stiffness No med changes FU 4 months 01/12/22 [facility] visits:  None in previous 6 months  Medications: Outpatient Encounter Medications as of 01/19/2022  Medication Sig   acetaminophen (TYLENOL) 500 MG tablet Take by mouth.   carbidopa-levodopa (SINEMET IR) 25-100 MG tablet Take 2 tablets at 6 AM, 1 at 9AM, 1 tab at 12 PM,  2 at 3 PM, 1 at 6 PM and 2 at 9 PM.   Carbidopa-Levodopa ER (SINEMET CR) 25-100 MG tablet controlled release Take 1 tab at 9pm   Cholecalciferol (VITAMIN D-3) 25 MCG (1000 UT) CAPS Take 1 capsule by mouth daily.   Cyanocobalamin (VITAMIN B-12) 5000 MCG SUBL Place 1 each under the tongue daily.   diltiazem (CARDIZEM CD) 180 MG 24 hr capsule TAKE ONE CAPSULE BY MOUTH EVERY MORNING   DULoxetine (CYMBALTA) 60 MG capsule TAKE ONE CAPSULE BY MOUTH TWICE DAILY FOR anxiety AND depression   ezetimibe (ZETIA) 10 MG tablet TAKE ONE TABLET BY MOUTH EVERY MORNING   fluticasone (FLONASE) 50 MCG/ACT nasal spray Place 2 sprays into both nostrils daily as needed for rhinitis or allergies.   furosemide (LASIX) 20 MG tablet Take 1 tablet (20 mg total) by mouth every morning.   furosemide (LASIX) 20 MG tablet Take 1 tablet (20 mg total) by mouth as needed. If needed above your regular daily dose of furosemide   gabapentin (NEURONTIN) 100 MG capsule TAKE 1 CAPSULE BY MOUTH THREE TIMES A DAY for anxiety.   hydrocortisone 2.5 % cream Apply to affected under breasts 1-2 times a day as directed and as needed for itch. Can also use on face as needed for itch   JARDIANCE 10 MG TABS tablet TAKE ONE TABLET BY MOUTH BEFORE BREAKFAST   ketoconazole (NIZORAL) 2 % cream Apply to affected areas under  breast twice daily for rash until healed. Can also use on face as needed for dermatitis   metoprolol tartrate (LOPRESSOR) 25 MG tablet TAKE THREE TABLETS BY MOUTH TWICE DAILY   oxybutynin (DITROPAN) 5 MG tablet Take 5 mg by mouth 2 (two) times daily.   sotalol (BETAPACE) 120 MG tablet TAKE ONE TABLET BY MOUTH TWICE DAILY   spironolactone (ALDACTONE) 25 MG tablet Take 1 tablet (25 mg total) by mouth daily.   tiZANidine (ZANAFLEX) 4 MG tablet TAKE 2 TABLET (4 MG TOTAL) BY MOUTH DAILY FOR MUSCLE SPASMS AT BEDTIME.   No facility-administered encounter medications on file as of 01/19/2022.   BP Readings from Last 3 Encounters:  11/25/21 124/76  11/24/21 124/72  11/12/21 124/76    Lab Results  Component Value Date   HGBA1C 5.8 (A) 06/22/2021    Recent OV, Consult or Hospital visit:  No medication changes indicated  Last adherence delivery date: 12/31/2021      Patient is due for next adherence delivery on: 01/31/2022  Spoke with patient on 01/19/2022 reviewed medications and coordinated delivery.  This delivery to include: Adherence Packaging  30 Days  Packs: Sotalol '120mg'$  1 tablet twice daily (1 breakfast and 1 evening meal) Tizanidine '4mg'$  2 tablets daily (2 bedtime) Ezetimibe '10mg'$  1 tablet daily (breakfast) Gabapentin 100 mg 1 tablet at breakfast, 1 tablet at evening meal, 1 tab at bedtime  Spironolactone  $'25mg'G$  tablet 1 tablet (breakfast) Jardiance 10 mg 1 tablet daily (before breakfast) Diltiazem '180mg'$  1 tablet daily (breakfast) Duloxetine 60 mg 1 tablet twice a day (1 breakfast, 1 evening meal) Metoprolol '25mg'$  3 tablets twice a day (3 breakfast and 3 evening meal) Furosemide 20 mg - 1 tablet daily (breakfast) Carbidopa-Levodopa ER 25-'100mg'$  - 1 tablet at bedtime Vitamin D - 800 units - 1 tablet daily (breakfast)   VIAL medications: w/o safety caps Carbidopa-Levodopa 25-'100mg'$ Take 2 tablets at 6 AM, 1 at 9AM, 1 tab at 12 PM,  2 at 3 PM, 1 at 6 PM and 2 at 9 PM  May take extra tab  as need.  Gabapentin '100mg'$  1 capsule three times a day (1 breakfast, 1 evening meal, 1 bedtime) May take 1 extra daily, PRN in vial.  Furosemide 20 mg - 1 tablet PRN above regular daily dose of Furosemide  Fluticasone Propionate 50 mcg Place 2 sprays into both nostrils daily    Patient declined the following medications this month: None declined   Declined by provider [name], NP - Cardiology) until labs are drawn: Patient is aware; gave patient phone number and asked her to call to make appt.  Potassium Chloride 10 MEQ CR Capsule - 1 capsule every morning (1 breakfast)  Filled at [facility] on 01/09/2022 [facility] needs to deliver on 02/08/2022 Oxybutynin 5 mg 1 tab at breakfast 1 tab at evening meal  Any concerns about your medications? No  How often do you forget or accidentally miss a dose? Rarely  Do you use a pillbox? No  Is patient in packaging Yes  If yes  What is the date on your next pill pack? Patient was not home to verify  Any concerns or issues with your packaging? No  Refills requested from providers include: Gabapentin 100 mg 1 tablet at breakfast, 1 tablet at evening meal, 1 tab at bedtime  Gabapentin '100mg'$  1 capsule three times a day (1 breakfast, 1 evening meal, 1 bedtime) May take 1 extra daily, PRN in vial.   Confirmed delivery date of 01/31/2022, advised patient that pharmacy will contact them the morning of delivery.   Recent blood pressure readings are as follows: Patient does not check at home.   Recent blood glucose readings are as follows: Patient does not check at home.   Annual wellness visit in last year? Yes 01/06/22 Most Recent BP reading: 124/76 on 11/25/2021  If Diabetic: Most recent A1C reading: 5.8 on 06/22/21 Last eye exam / retinopathy screening: Up to date Last diabetic foot exam: Up to date   Upcoming appointments: Cardiology appointment on 02/08/2022 CCM appointment on 04/04/2022   Charlene Brooke, CPP notified   [name], [place] Assistant [phone]

## 2022-01-20 DIAGNOSIS — K219 Gastro-esophageal reflux disease without esophagitis: Secondary | ICD-10-CM | POA: Diagnosis not present

## 2022-01-20 DIAGNOSIS — Z9181 History of falling: Secondary | ICD-10-CM | POA: Diagnosis not present

## 2022-01-20 DIAGNOSIS — M109 Gout, unspecified: Secondary | ICD-10-CM | POA: Diagnosis not present

## 2022-01-20 DIAGNOSIS — E669 Obesity, unspecified: Secondary | ICD-10-CM | POA: Diagnosis not present

## 2022-01-20 DIAGNOSIS — I4891 Unspecified atrial fibrillation: Secondary | ICD-10-CM | POA: Diagnosis not present

## 2022-01-20 DIAGNOSIS — M5030 Other cervical disc degeneration, unspecified cervical region: Secondary | ICD-10-CM | POA: Diagnosis not present

## 2022-01-20 DIAGNOSIS — G2581 Restless legs syndrome: Secondary | ICD-10-CM | POA: Diagnosis not present

## 2022-01-20 DIAGNOSIS — G4733 Obstructive sleep apnea (adult) (pediatric): Secondary | ICD-10-CM | POA: Diagnosis not present

## 2022-01-20 DIAGNOSIS — Z7984 Long term (current) use of oral hypoglycemic drugs: Secondary | ICD-10-CM | POA: Diagnosis not present

## 2022-01-20 DIAGNOSIS — M1712 Unilateral primary osteoarthritis, left knee: Secondary | ICD-10-CM | POA: Diagnosis not present

## 2022-01-20 DIAGNOSIS — Z853 Personal history of malignant neoplasm of breast: Secondary | ICD-10-CM | POA: Diagnosis not present

## 2022-01-20 DIAGNOSIS — I509 Heart failure, unspecified: Secondary | ICD-10-CM | POA: Diagnosis not present

## 2022-01-20 DIAGNOSIS — M5136 Other intervertebral disc degeneration, lumbar region: Secondary | ICD-10-CM | POA: Diagnosis not present

## 2022-01-20 DIAGNOSIS — I110 Hypertensive heart disease with heart failure: Secondary | ICD-10-CM | POA: Diagnosis not present

## 2022-01-20 DIAGNOSIS — R69 Illness, unspecified: Secondary | ICD-10-CM | POA: Diagnosis not present

## 2022-01-20 DIAGNOSIS — G20 Parkinson's disease: Secondary | ICD-10-CM | POA: Diagnosis not present

## 2022-01-23 DIAGNOSIS — J019 Acute sinusitis, unspecified: Secondary | ICD-10-CM | POA: Diagnosis not present

## 2022-01-26 NOTE — Progress Notes (Unsigned)
HPI: Follow-up diastolic congestive heart failure, paroxysmal atrial fibrillation, pericardial effusion, hypertension and hyperlipidemia. Patient was admitted March 2019 with atypical chest pain and paroxysmal atrial fibrillation. She was treated with sotalol and Xarelto. However she returned with significant dyspnea and follow-up echocardiogram showed large pericardial effusion with tamponade physiology.  She had pericardiocentesis on April 1. Cytology was negative.  At that time we felt it was likely her initial presentation was pericarditis and she had a hemorrhagic pericardial effusion from initiation of anticoagulation.  We felt her atrial fibrillation may improve once pericardial process resolved. Patient was admitted with pneumonia and recurrent atrial fibrilllation to an outside hospital. Last echocardiogram September 2022 showed normal LV function, mild left ventricular hypertrophy, grade 2 diastolic dysfunction, moderate pulmonary hypertension, severe left atrial enlargement, cannot rule out PFO, mild aortic stenosis with mean gradient 10 mmHg.  Since last seen   Current Outpatient Medications  Medication Sig Dispense Refill   acetaminophen (TYLENOL) 500 MG tablet Take by mouth.     carbidopa-levodopa (SINEMET IR) 25-100 MG tablet Take 2 tablets at 6 AM, 1 at 9AM, 1 tab at 12 PM,  2 at 3 PM, 1 at 6 PM and 2 at 9 PM.     Carbidopa-Levodopa ER (SINEMET CR) 25-100 MG tablet controlled release Take 1 tab at 9pm     Cholecalciferol (VITAMIN D-3) 25 MCG (1000 UT) CAPS Take 1 capsule by mouth daily.     Cyanocobalamin (VITAMIN B-12) 5000 MCG SUBL Place 1 each under the tongue daily.     diltiazem (CARDIZEM CD) 180 MG 24 hr capsule TAKE ONE CAPSULE BY MOUTH EVERY MORNING 90 capsule 2   DULoxetine (CYMBALTA) 60 MG capsule TAKE ONE CAPSULE BY MOUTH TWICE DAILY FOR anxiety AND depression 180 capsule 2   ezetimibe (ZETIA) 10 MG tablet TAKE ONE TABLET BY MOUTH EVERY MORNING 90 tablet 3    fluticasone (FLONASE) 50 MCG/ACT nasal spray Place 2 sprays into both nostrils daily as needed for rhinitis or allergies. 48 g 3   furosemide (LASIX) 20 MG tablet Take 1 tablet (20 mg total) by mouth every morning. 90 tablet 3   furosemide (LASIX) 20 MG tablet Take 1 tablet (20 mg total) by mouth as needed. If needed above your regular daily dose of furosemide 90 tablet 3   gabapentin (NEURONTIN) 100 MG capsule TAKE 1 CAPSULE BY MOUTH THREE TIMES A DAY for anxiety. 270 capsule 1   hydrocortisone 2.5 % cream Apply to affected under breasts 1-2 times a day as directed and as needed for itch. Can also use on face as needed for itch 28 g 3   JARDIANCE 10 MG TABS tablet TAKE ONE TABLET BY MOUTH BEFORE BREAKFAST 30 tablet 5   ketoconazole (NIZORAL) 2 % cream Apply to affected areas under breast twice daily for rash until healed. Can also use on face as needed for dermatitis 30 g 5   metoprolol tartrate (LOPRESSOR) 25 MG tablet TAKE THREE TABLETS BY MOUTH TWICE DAILY 540 tablet 1   oxybutynin (DITROPAN) 5 MG tablet Take 5 mg by mouth 2 (two) times daily.     sotalol (BETAPACE) 120 MG tablet TAKE ONE TABLET BY MOUTH TWICE DAILY 180 tablet 3   spironolactone (ALDACTONE) 25 MG tablet Take 1 tablet (25 mg total) by mouth daily. 90 tablet 2   tiZANidine (ZANAFLEX) 4 MG tablet TAKE 2 TABLET (4 MG TOTAL) BY MOUTH DAILY FOR MUSCLE SPASMS AT BEDTIME. 180 tablet 3   No  current facility-administered medications for this visit.     Past Medical History:  Diagnosis Date   Anxiety    Atrial fibrillation ([place])    Cat bite of right hand 03/25/2019   Chronic diastolic CHF (congestive heart failure) ([place])    a. echo 09/2014: EF 03-50%, diastolic dysfunction, mild LVH, nl RV size & systolic function, mildly dilated LA (4.3 cm), mild MR/TR, mildly elevated PASP 36.7 mm Hg   DDD (degenerative disc disease), cervical    DDD (degenerative disc disease), lumbar    Depression    Diffuse cystic mastopathy 2014   Dyspnea     Dysrhythmia    GERD (gastroesophageal reflux disease)    Gout    Headache    rare   HLD (hyperlipidemia)    a. statin intolerant 2/2 myalgias   Hx of dysplastic nevus 12/25/2018   L medial ankle   Hypercholesterolemia    Hypertension    Malignant neoplasm of upper-outer quadrant of female breast ([place]) 10/2012   Papillary DCIS, sentinel node negative. DR/PR positive. PARTIAL RIGHT MASTECTOMY FOR BREAST CANCER--HAD RADIATION - NO CHEMO --DR. CRYSTAL [place] ONCOLOGIST   Obesity    OSA on CPAP    Osteoarthritis of both knees    a. s/p right TKA 04/2013 & left TKA 09/2014   Otitis externa    Parkinson disease [facility])    Personal history of radiation therapy 2015   RIGHT breast-mammosite per pt   Pneumonia of both lungs due to infectious organism 01/08/2021   Sleep difficulties    LUNESTA HAS HELPED   Vaginal cyst     Past Surgical History:  Procedure Laterality Date   ABDOMINAL HYSTERECTOMY  1992   DUB; fibroids; endometriosis.  One remaining ovary.     BREAST LUMPECTOMY Right 2015   Papillary DCIS, sentinel node negative. DR/PR positive. PARTIAL RIGHT MASTECTOMY FOR BREAST CANCER--HAD RADIATION - NO CHEMO --DR. CRYSTAL  ONCOLOGIST   BREAST SURGERY Right 10/2012   Wide excision,APB RT 10 mm papillary DCIS, ER/PR positive. Sentinel node negative. Partial breast radiation.   CATARACT EXTRACTION, BILATERAL  02/13/2016   [facility].   CHOLECYSTECTOMY  1994   COLONOSCOPY  2015   1 benign polyp-every 5 years/ Dr Candace Cruise   COLONOSCOPY WITH PROPOFOL N/A 02/10/2021   Procedure: COLONOSCOPY WITH PROPOFOL;  Surgeon: Robert Bellow, MD;  Location: [facility] ENDOSCOPY;  Service: Endoscopy;  Laterality: N/A;   ERCP  1995   JOINT REPLACEMENT Right 04/2013   knee   PERICARDIOCENTESIS N/A 11/13/2017   Procedure: PERICARDIOCENTESIS;  Surgeon: Nelva Bush, MD;  Location: [place] [facility] CV LAB;  Service: Cardiovascular;  Laterality: N/A;   TOTAL KNEE ARTHROPLASTY Right 04/16/2013    Procedure: RIGHT TOTAL KNEE ARTHROPLASTY;  Surgeon: Mauri Pole, MD;  Location: [facility] ORS;  Service: Orthopedics;  Laterality: Right;   TOTAL KNEE ARTHROPLASTY Left 09/15/2014   Procedure: LEFT TOTAL KNEE ARTHROPLASTY;  Surgeon: Mauri Pole, MD;  Location: [facility] ORS;  Service: Orthopedics;  Laterality: Left;   TUBAL LIGATION  1979   UPPER GI ENDOSCOPY      Social History   Socioeconomic History   Marital status: Widowed    Spouse name: Chrissie Noa   Number of children: 2   Years of education: College   Highest education level: Bachelor's degree (e.g., BA, AB, BS)  Occupational History   Occupation: Retired  Tobacco Use   Smoking status: Never   Smokeless tobacco: Never   Tobacco comments:    social smoker as a teen  Vaping Use   Vaping Use: Never used  Substance and Sexual Activity   Alcohol use: No   Drug use: No   Sexual activity: Not Currently    Birth control/protection: Post-menopausal, Surgical  Other Topics Concern   Not on file  Social History Narrative   Marital status: widowed since 09/16/2015      Children: 2 children (77, 72); 5 grandchild (4 in [place]; 1 in [place]).      Lives: alone in townhome; 1 dog, 2 cats      Employment: psychiatric Education officer, museum; retired in 2015      Tobacco: teenager only      Alcohol:  None      Drugs: none      Exercise: walking in 2019; more active in 2019.  Walking daily small amounts.        ADLs: drives; independent with ADLs; no assistant devices      Advanced Directives: none; FULL CODE; no prolonged measures.   Does not need them.  Daughter/Heather Vista Deck is HCPOA.    Social Determinants of Health   Financial Resource Strain: Low Risk  (01/06/2022)   Overall Financial Resource Strain (CARDIA)    Difficulty of Paying Living Expenses: Not hard at all  Food Insecurity: No Food Insecurity (01/06/2022)   Hunger Vital Sign    Worried About Running Out of Food in the Last Year: Never true    Ran Out of Food in the Last Year: Never true   Transportation Needs: No Transportation Needs (01/06/2022)   PRAPARE - Hydrologist (Medical): No    Lack of Transportation (Non-Medical): No  Physical Activity: Insufficiently Active (01/06/2022)   Exercise Vital Sign    Days of Exercise per Week: 7 days    Minutes of Exercise per Session: 10 min  Stress: No Stress Concern Present (01/06/2022)   [place]    Feeling of Stress : Not at all  Social Connections: Not on file  Intimate Partner Violence: Not on file    Family History  Problem Relation Age of Onset   Colon cancer Mother    Cancer Mother 70       colon cancer   Melanoma Father    Cancer Father 69       melanoma   Colon cancer Maternal Grandfather    Breast cancer Maternal Grandfather    Melanoma Sister    Melanoma Sister     ROS: no fevers or chills, productive cough, hemoptysis, dysphasia, odynophagia, melena, hematochezia, dysuria, hematuria, rash, seizure activity, orthopnea, PND, pedal edema, claudication. Remaining systems are negative.  Physical Exam: Well-developed well-nourished in no acute distress.  Skin is warm and dry.  HEENT is normal.  Neck is supple.  Chest is clear to auscultation with normal expansion.  Cardiovascular exam is regular rate and rhythm.  Abdominal exam nontender or distended. No masses palpated. Extremities show no edema. neuro grossly intact  ECG- personally reviewed  A/P  1 paroxysmal atrial fibrillation-patient remains in sinus rhythm.  Continue sotalol.  Continue Cardizem and beta-blocker for rate control if atrial fibrillation recurs.  However note previous atrial fibrillation was felt secondary to pericarditis and pneumonia.  She had been on Xarelto at that time but developed hemorrhagic pericardial effusion and it has not been resumed.  2 chronic diastolic congestive heart failure-patient remains euvolemic.  Continue diuretic at  present dose.  3 hypertension-patient's blood pressure is controlled today.  Continue  present medications.  4 hyperlipidemia-she is intolerant to statins.  We will continue Zetia.  5 history of hemorrhagic pericardial effusion-follow-up echocardiogram most recently showed no effusion.  [name], MD

## 2022-01-27 ENCOUNTER — Ambulatory Visit (INDEPENDENT_AMBULATORY_CARE_PROVIDER_SITE_OTHER): Payer: Medicare HMO | Admitting: Nurse Practitioner

## 2022-01-27 ENCOUNTER — Ambulatory Visit (INDEPENDENT_AMBULATORY_CARE_PROVIDER_SITE_OTHER)
Admission: RE | Admit: 2022-01-27 | Discharge: 2022-01-27 | Disposition: A | Discharge status: Home / Self Care | Payer: Medicare HMO | Source: Ambulatory Visit | Attending: Nurse Practitioner | Admitting: Nurse Practitioner

## 2022-01-27 ENCOUNTER — Telehealth: Payer: Self-pay

## 2022-01-27 ENCOUNTER — Encounter: Payer: Self-pay | Admitting: Nurse Practitioner

## 2022-01-27 VITALS — BP 100/62 | HR 65 | Temp 97.9°F | Resp 18 | Ht 62.0 in | Wt 149.0 lb

## 2022-01-27 DIAGNOSIS — R051 Acute cough: Secondary | ICD-10-CM

## 2022-01-27 DIAGNOSIS — E559 Vitamin D deficiency, unspecified: Secondary | ICD-10-CM | POA: Diagnosis not present

## 2022-01-27 DIAGNOSIS — R059 Cough, unspecified: Secondary | ICD-10-CM | POA: Diagnosis not present

## 2022-01-27 DIAGNOSIS — R509 Fever, unspecified: Secondary | ICD-10-CM | POA: Diagnosis not present

## 2022-01-27 DIAGNOSIS — E538 Deficiency of other specified B group vitamins: Secondary | ICD-10-CM | POA: Insufficient documentation

## 2022-01-27 DIAGNOSIS — R5381 Other malaise: Secondary | ICD-10-CM | POA: Diagnosis not present

## 2022-01-27 DIAGNOSIS — R062 Wheezing: Secondary | ICD-10-CM | POA: Diagnosis not present

## 2022-01-27 LAB — VITAMIN B12: Vitamin B-12: >1504 pg/mL — ABNORMAL HIGH (ref 211–911)

## 2022-01-27 LAB — VITAMIN D 25 HYDROXY (VIT D DEFICIENCY, FRACTURES): VITD: 35.51 ng/mL (ref 30.00–100.00)

## 2022-01-27 MED ORDER — PREDNISONE 10 MG PO TABS: ORAL_TABLET | ORAL | 0 refills | Status: AC

## 2022-01-27 NOTE — Telephone Encounter (Signed)
Patient's care taker called waned to know if you thought it would help if she gave patient breathing treatment. States she has nebulizer and xopenex at home that was prescribed to her. She is not having any issues with her breathing but you had mentioned sending in inhaler.

## 2022-01-27 NOTE — Telephone Encounter (Signed)
Call back received from care giver. Information given will call if any questions.

## 2022-01-27 NOTE — Telephone Encounter (Signed)
Left message to return call to our office.  

## 2022-01-27 NOTE — Assessment & Plan Note (Signed)
No wheezing auscultated in office.  But given patient does have history of atrial fibrillation we will avoid albuterol use.  We will place her on prednisone 10 mg taper as prescribed.  Advised patient to take with food and avoid NSAIDs while taking medication.

## 2022-01-27 NOTE — Assessment & Plan Note (Signed)
Subjective fever and chills.  Has been using Tylenol as needed with good relief continues on Tylenol as needed over-the-counter.

## 2022-01-27 NOTE — Patient Instructions (Signed)
Nice to see you today I will be in touch with the xray results once I have them I sent some prednisone into the pharmacy today. Continue taking the doxycycline and tessalon pearles Follow up if no improvement

## 2022-01-27 NOTE — Assessment & Plan Note (Signed)
Recently seen approximately 2 months ago and diagnosed with vitamin B12 deficiency has been replacing orally for approximately 2 months.  Caregiver requested recheck of B12 levels today pending lab results

## 2022-01-27 NOTE — Progress Notes (Signed)
Acute Office Visit  Subjective:     Patient ID: Selena Bush, female    DOB: Oct 13, 1945, 76 y.o.   MRN: [id number]  Chief Complaint  Patient presents with   Cough    Went to Urgent Care on 01/23/22-wheezing and coughing still. She is taking antibiotic still and Tessalon Perles do not help a lot.     Patient is in today for cough  Patient was seen and evaluated at urgent care on 01/23/2022. She was written doxycyline and tessalon perales.  States that when she woke up Wednesday morning she felt worse. Caregiver states that she did not have a good day Tuesday. States that her temperature was 99-100, but felt much warmer. Was taking tylenol with some relief.  Feels like she is improved but just minimally.  Review of Systems  Constitutional:  Positive for chills, fever and malaise/fatigue.       Appetite has decreased Fluid intake slightly less  HENT:  Positive for sore throat (intermittently). Negative for ear discharge, ear pain and sinus pain.   Respiratory:  Positive for cough (was until tuesday. was big thinck pockets of "junk") and wheezing. Negative for shortness of breath.   Gastrointestinal:  Negative for diarrhea, nausea and vomiting.  Musculoskeletal:  Positive for myalgias.  Neurological:  Negative for headaches.        Objective:    BP 100/62   Pulse 65   Temp 97.9 F (36.6 C)   Resp 18   Ht '5\' 2"'$  (1.575 m)   Wt 149 lb (67.6 kg)   SpO2 95%   BMI 27.25 kg/m  BP Readings from Last 3 Encounters:  01/27/22 100/62  11/25/21 124/76  11/24/21 124/72   Wt Readings from Last 3 Encounters:  01/27/22 149 lb (67.6 kg)  01/06/22 155 lb (70.3 kg)  11/25/21 159 lb (72.1 kg)      Physical Exam Vitals and nursing note reviewed.  Constitutional:      Appearance: Normal appearance.     Comments: Patient using walker. Caregiver at bedside  HENT:     Right Ear: Tympanic membrane, ear canal and external ear normal.     Left Ear: Tympanic membrane, ear canal and  external ear normal.     Mouth/Throat:     Mouth: Mucous membranes are moist.     Pharynx: Oropharynx is clear.  Cardiovascular:     Rate and Rhythm: Normal rate and regular rhythm.  Pulmonary:     Effort: Pulmonary effort is normal.     Comments: Coarse diminished breath sounds Lymphadenopathy:     Cervical: No cervical adenopathy.  Neurological:     Mental Status: She is alert.     No results found for any visits on 01/27/22.      Assessment & Plan:   Problem List Items Addressed This Visit       Other   Acute cough - Primary    Patient continue taking prescribed Tessalon Perles as needed.      Relevant Orders   DG Chest 2 View   Vitamin B 12 deficiency    Recently seen approximately 2 months ago and diagnosed with vitamin B12 deficiency has been replacing orally for approximately 2 months.  Caregiver requested recheck of B12 levels today pending lab results      Relevant Orders   Vitamin B12   Malaise   Fever and chills    Subjective fever and chills.  Has been using Tylenol as needed with good relief  continues on Tylenol as needed over-the-counter.      Relevant Orders   DG Chest 2 View   Wheezing    No wheezing auscultated in office.  But given patient does have history of atrial fibrillation we will avoid albuterol use.  We will place her on prednisone 10 mg taper as prescribed.  Advised patient to take with food and avoid NSAIDs while taking medication.      Relevant Medications   predniSONE (DELTASONE) 10 MG tablet   Other Relevant Orders   DG Chest 2 View   Vitamin D deficiency    Per patient and caregiver report.  States Dr. Melrose Nakayama states her vitamin D level was lower than what he would like.  States they tried over-the-counter supplement without relief and started with prescription requested vitamin D level being checked today, pending lab results      Relevant Orders   VITAMIN D 25 Hydroxy (Vit-D Deficiency, Fractures)    Meds ordered this  encounter  Medications   predniSONE (DELTASONE) 10 MG tablet    Sig: Take 1 tablet (10 mg total) by mouth 2 (two) times daily with a meal for 3 days, THEN 1 tablet (10 mg total) daily with breakfast for 3 days.    Dispense:  9 tablet    Refill:  0    Order Specific Question:   Supervising Provider    Answer:   TOWER, MARNE A [[id number]    Return if symptoms worsen or fail to improve.  Romilda Garret, NP

## 2022-01-27 NOTE — Assessment & Plan Note (Signed)
Per patient and caregiver report.  States Dr. Melrose Nakayama states her vitamin D level was lower than what he would like.  States they tried over-the-counter supplement without relief and started with prescription requested vitamin D level being checked today, pending lab results

## 2022-01-27 NOTE — Assessment & Plan Note (Signed)
Patient continue taking prescribed Tessalon Perles as needed.

## 2022-01-27 NOTE — Telephone Encounter (Signed)
Lets hold off on that. The reason I prescribed prednisone is to avoid albuterol or levoalbuterol

## 2022-01-28 ENCOUNTER — Telehealth: Payer: Self-pay

## 2022-01-28 NOTE — Telephone Encounter (Signed)
Patient advised, see lab result notes from [facility]

## 2022-01-28 NOTE — Telephone Encounter (Signed)
Patient is calling in asking if someone is able to give her a call about lab results and xray results.

## 2022-02-02 ENCOUNTER — Telehealth: Payer: Self-pay

## 2022-02-02 NOTE — Progress Notes (Signed)
Patient's caregiver called and stated patient does not need Gabapentin in a vial as they have three bottles on hand. She also stated patient does not need Flonase as she has four bottles on hand.  Patient just got her medication delivery two days ago on 01/31/2022. I have made [facility] pharmacy aware.  Charlene Brooke, CPP notified  [name], [place] Clinical Pharmacy Assistant [phone]

## 2022-02-08 ENCOUNTER — Ambulatory Visit (INDEPENDENT_AMBULATORY_CARE_PROVIDER_SITE_OTHER): Payer: Medicare HMO | Admitting: Cardiology

## 2022-02-08 ENCOUNTER — Encounter: Payer: Self-pay | Admitting: Cardiology

## 2022-02-08 VITALS — BP 106/65 | HR 70 | Ht 62.0 in | Wt 145.8 lb

## 2022-02-08 DIAGNOSIS — I10 Essential (primary) hypertension: Secondary | ICD-10-CM | POA: Diagnosis not present

## 2022-02-08 DIAGNOSIS — E782 Mixed hyperlipidemia: Secondary | ICD-10-CM

## 2022-02-08 DIAGNOSIS — I5032 Chronic diastolic (congestive) heart failure: Secondary | ICD-10-CM | POA: Diagnosis not present

## 2022-02-08 DIAGNOSIS — I480 Paroxysmal atrial fibrillation: Secondary | ICD-10-CM

## 2022-02-10 DIAGNOSIS — Z7984 Long term (current) use of oral hypoglycemic drugs: Secondary | ICD-10-CM | POA: Diagnosis not present

## 2022-02-10 DIAGNOSIS — Z853 Personal history of malignant neoplasm of breast: Secondary | ICD-10-CM | POA: Diagnosis not present

## 2022-02-10 DIAGNOSIS — Z9181 History of falling: Secondary | ICD-10-CM | POA: Diagnosis not present

## 2022-02-10 DIAGNOSIS — M5030 Other cervical disc degeneration, unspecified cervical region: Secondary | ICD-10-CM | POA: Diagnosis not present

## 2022-02-10 DIAGNOSIS — E785 Hyperlipidemia, unspecified: Secondary | ICD-10-CM | POA: Diagnosis not present

## 2022-02-10 DIAGNOSIS — K219 Gastro-esophageal reflux disease without esophagitis: Secondary | ICD-10-CM | POA: Diagnosis not present

## 2022-02-10 DIAGNOSIS — K5900 Constipation, unspecified: Secondary | ICD-10-CM | POA: Diagnosis not present

## 2022-02-10 DIAGNOSIS — M109 Gout, unspecified: Secondary | ICD-10-CM | POA: Diagnosis not present

## 2022-02-10 DIAGNOSIS — Z96653 Presence of artificial knee joint, bilateral: Secondary | ICD-10-CM | POA: Diagnosis not present

## 2022-02-10 DIAGNOSIS — G20 Parkinson's disease: Secondary | ICD-10-CM | POA: Diagnosis not present

## 2022-02-10 DIAGNOSIS — Z9049 Acquired absence of other specified parts of digestive tract: Secondary | ICD-10-CM | POA: Diagnosis not present

## 2022-02-10 DIAGNOSIS — G5602 Carpal tunnel syndrome, left upper limb: Secondary | ICD-10-CM | POA: Diagnosis not present

## 2022-02-10 DIAGNOSIS — Z9011 Acquired absence of right breast and nipple: Secondary | ICD-10-CM | POA: Diagnosis not present

## 2022-02-10 DIAGNOSIS — R69 Illness, unspecified: Secondary | ICD-10-CM | POA: Diagnosis not present

## 2022-02-10 DIAGNOSIS — I509 Heart failure, unspecified: Secondary | ICD-10-CM | POA: Diagnosis not present

## 2022-02-10 DIAGNOSIS — I110 Hypertensive heart disease with heart failure: Secondary | ICD-10-CM | POA: Diagnosis not present

## 2022-02-10 DIAGNOSIS — Z90711 Acquired absence of uterus with remaining cervical stump: Secondary | ICD-10-CM | POA: Diagnosis not present

## 2022-02-10 DIAGNOSIS — E669 Obesity, unspecified: Secondary | ICD-10-CM | POA: Diagnosis not present

## 2022-02-10 DIAGNOSIS — I4891 Unspecified atrial fibrillation: Secondary | ICD-10-CM | POA: Diagnosis not present

## 2022-02-10 DIAGNOSIS — N6019 Diffuse cystic mastopathy of unspecified breast: Secondary | ICD-10-CM | POA: Diagnosis not present

## 2022-02-10 DIAGNOSIS — M5136 Other intervertebral disc degeneration, lumbar region: Secondary | ICD-10-CM | POA: Diagnosis not present

## 2022-02-10 DIAGNOSIS — G2581 Restless legs syndrome: Secondary | ICD-10-CM | POA: Diagnosis not present

## 2022-02-10 DIAGNOSIS — G4733 Obstructive sleep apnea (adult) (pediatric): Secondary | ICD-10-CM | POA: Diagnosis not present

## 2022-02-15 DIAGNOSIS — K5900 Constipation, unspecified: Secondary | ICD-10-CM | POA: Diagnosis not present

## 2022-02-15 DIAGNOSIS — I4891 Unspecified atrial fibrillation: Secondary | ICD-10-CM | POA: Diagnosis not present

## 2022-02-15 DIAGNOSIS — Z9011 Acquired absence of right breast and nipple: Secondary | ICD-10-CM | POA: Diagnosis not present

## 2022-02-15 DIAGNOSIS — M5136 Other intervertebral disc degeneration, lumbar region: Secondary | ICD-10-CM | POA: Diagnosis not present

## 2022-02-15 DIAGNOSIS — R69 Illness, unspecified: Secondary | ICD-10-CM | POA: Diagnosis not present

## 2022-02-15 DIAGNOSIS — E669 Obesity, unspecified: Secondary | ICD-10-CM | POA: Diagnosis not present

## 2022-02-15 DIAGNOSIS — Z9049 Acquired absence of other specified parts of digestive tract: Secondary | ICD-10-CM | POA: Diagnosis not present

## 2022-02-15 DIAGNOSIS — E785 Hyperlipidemia, unspecified: Secondary | ICD-10-CM | POA: Diagnosis not present

## 2022-02-15 DIAGNOSIS — Z7984 Long term (current) use of oral hypoglycemic drugs: Secondary | ICD-10-CM | POA: Diagnosis not present

## 2022-02-15 DIAGNOSIS — G4733 Obstructive sleep apnea (adult) (pediatric): Secondary | ICD-10-CM | POA: Diagnosis not present

## 2022-02-15 DIAGNOSIS — M109 Gout, unspecified: Secondary | ICD-10-CM | POA: Diagnosis not present

## 2022-02-15 DIAGNOSIS — G5602 Carpal tunnel syndrome, left upper limb: Secondary | ICD-10-CM | POA: Diagnosis not present

## 2022-02-15 DIAGNOSIS — G20 Parkinson's disease: Secondary | ICD-10-CM | POA: Diagnosis not present

## 2022-02-15 DIAGNOSIS — Z96653 Presence of artificial knee joint, bilateral: Secondary | ICD-10-CM | POA: Diagnosis not present

## 2022-02-15 DIAGNOSIS — G2581 Restless legs syndrome: Secondary | ICD-10-CM | POA: Diagnosis not present

## 2022-02-15 DIAGNOSIS — I509 Heart failure, unspecified: Secondary | ICD-10-CM | POA: Diagnosis not present

## 2022-02-15 DIAGNOSIS — Z853 Personal history of malignant neoplasm of breast: Secondary | ICD-10-CM | POA: Diagnosis not present

## 2022-02-15 DIAGNOSIS — Z90711 Acquired absence of uterus with remaining cervical stump: Secondary | ICD-10-CM | POA: Diagnosis not present

## 2022-02-15 DIAGNOSIS — N6019 Diffuse cystic mastopathy of unspecified breast: Secondary | ICD-10-CM | POA: Diagnosis not present

## 2022-02-15 DIAGNOSIS — K219 Gastro-esophageal reflux disease without esophagitis: Secondary | ICD-10-CM | POA: Diagnosis not present

## 2022-02-15 DIAGNOSIS — Z9181 History of falling: Secondary | ICD-10-CM | POA: Diagnosis not present

## 2022-02-15 DIAGNOSIS — M5030 Other cervical disc degeneration, unspecified cervical region: Secondary | ICD-10-CM | POA: Diagnosis not present

## 2022-02-15 DIAGNOSIS — I110 Hypertensive heart disease with heart failure: Secondary | ICD-10-CM | POA: Diagnosis not present

## 2022-02-17 ENCOUNTER — Other Ambulatory Visit: Payer: Self-pay | Admitting: Adult Health

## 2022-02-17 ENCOUNTER — Other Ambulatory Visit: Payer: Self-pay | Admitting: Family

## 2022-02-17 ENCOUNTER — Telehealth: Payer: Self-pay

## 2022-02-17 NOTE — Progress Notes (Signed)
Chronic Care Management Pharmacy Assistant   Name: Selena Bush  MRN: [id number] DOB: [birth year]  Reason for Encounter: CCM (Medication Adherence and Delivery Coordination)  Recent office visits:  None since last CCM contact  Recent consult visits:  02/08/22 [name], MD (Cardiology) Paroxysmal Atrial Fibrillation Ordered: EKG Stop: Benzonatate 200 mg Stop: Diltiazem 180 mg Stop: Doxycycline 100 mg FU 6 months 01/27/22 Karl Ito, NP (Family Medicine) Acute Cough Abnormal Labs: "Your Vitamin B12 level has responded well. I would cut the amount you are taking in half and take that moving forward. Your vitamin D level is low normal." Start: Prednisone 10 mg Change: Gabapentin 600 mg 01/23/22 Mariana Arn, MD (Internal Med) Acute Sinusitis Start: Flonase 50 mcg Start: Tessalon 200 mg Start: Doxycycline 100 mg  Hospital visits:  None in previous 6 months  Medications: Outpatient Encounter Medications as of 02/17/2022  Medication Sig   acetaminophen (TYLENOL) 500 MG tablet Take by mouth.   carbidopa-levodopa (SINEMET IR) 25-100 MG tablet Take 2 tablets at 6 AM, 1 at 9AM, 1 tab at 12 PM,  2 at 3 PM, 1 at 6 PM and 2 at 9 PM.   Carbidopa-Levodopa ER (SINEMET CR) 25-100 MG tablet controlled release Take 1 tab at 9pm   Cholecalciferol (VITAMIN D-3) 25 MCG (1000 UT) CAPS Take 1 capsule by mouth daily.   Cyanocobalamin (VITAMIN B-12) 5000 MCG SUBL Place 1 each under the tongue daily.   DULoxetine (CYMBALTA) 60 MG capsule TAKE ONE CAPSULE BY MOUTH TWICE DAILY FOR anxiety AND depression   ezetimibe (ZETIA) 10 MG tablet TAKE ONE TABLET BY MOUTH EVERY MORNING   fluticasone (FLONASE) 50 MCG/ACT nasal spray Place 2 sprays into both nostrils daily as needed for rhinitis or allergies.   furosemide (LASIX) 20 MG tablet Take 1 tablet (20 mg total) by mouth every morning.   furosemide (LASIX) 20 MG tablet Take 1 tablet (20 mg total) by mouth as needed. If needed above your regular daily dose of  furosemide   furosemide (LASIX) 20 MG tablet TAKE ONE TABLET BY MOUTH ONCE DAILY May take additional tablet daily if needed   gabapentin (NEURONTIN) 600 MG tablet TAKE ONE TABLET BY MOUTH EVERYDAY AT BEDTIME   hydrocortisone 2.5 % cream Apply to affected under breasts 1-2 times a day as directed and as needed for itch. Can also use on face as needed for itch   JARDIANCE 10 MG TABS tablet TAKE ONE TABLET BY MOUTH BEFORE BREAKFAST   ketoconazole (NIZORAL) 2 % cream Apply to affected areas under breast twice daily for rash until healed. Can also use on face as needed for dermatitis   metoprolol tartrate (LOPRESSOR) 25 MG tablet TAKE THREE TABLETS BY MOUTH TWICE DAILY   oxybutynin (DITROPAN) 5 MG tablet Take 5 mg by mouth 2 (two) times daily.   sotalol (BETAPACE) 120 MG tablet TAKE ONE TABLET BY MOUTH TWICE DAILY   spironolactone (ALDACTONE) 25 MG tablet Take 1 tablet (25 mg total) by mouth daily.   tiZANidine (ZANAFLEX) 4 MG tablet TAKE 2 TABLET (4 MG TOTAL) BY MOUTH DAILY FOR MUSCLE SPASMS AT BEDTIME.   No facility-administered encounter medications on file as of 02/17/2022.   BP Readings from Last 3 Encounters:  02/08/22 106/65  01/27/22 100/62  11/25/21 124/76    Lab Results  Component Value Date   HGBA1C 5.8 (A) 06/22/2021    Recent OV, Consult or Hospital visit:  Recent medication changes indicated:   Stop: Benzonatate 200 mg  Stop: Diltiazem 180 mg   Stop: Doxycycline 100 mg  Start: Prednisone 10 mg   Start: Flonase 50 mcg   Start: Tessalon 200 mg   Change: Gabapentin 600 mg  Last adherence delivery date: 01/31/22      Patient is due for next adherence delivery on: 03/02/22 Before 1:00  Spoke with patient on 02/18/2022 reviewed medications and coordinated delivery.  This delivery to include: Adherence Packaging  30 Days  Packs: Sotalol '120mg'$  1 tablet twice daily (1 breakfast and 1 evening meal) Tizanidine '4mg'$  2 tablets daily (2 bedtime) Ezetimibe '10mg'$  1 tablet daily  (breakfast) Spironolactone '25mg'$  tablet 1 tablet (breakfast) Jardiance 10 mg 1 tablet daily (before breakfast) Duloxetine 60 mg 1 tablet twice a day (1 breakfast, 1 evening meal) Oxybutynin 5 mg 2 tablets daily (1 tablet breakfast 1 tablet evening meal) Metoprolol '25mg'$  3 tablets twice a day (3 breakfast and 3 evening meal) Furosemide 20 mg - 1 tablet daily (breakfast) Carbidopa-Levodopa ER 25-'100mg'$  - 1 tablet at bedtime Vitamin D - 800 units - 1 tablet daily (breakfast) Gabapentin 600 mg - 1 tablet daily (bedtime)   VIAL medications: w/o safety caps Carbidopa-Levodopa 25-'100mg'$ Take 2 tablets at 6 AM, 1 at 9AM, 1 tab at 12 PM,  2 at 3 PM, 1 at 6 PM and 2 at 9 PM  May take extra tab as need.  Furosemide 20 mg - 1 tablet PRN above regular daily dose of Furosemide    Patient declined the following medications this month: Gabapentin '100mg'$  1 capsule three times a day (1 breakfast, 1 evening meal, 1 bedtime) May take 1 extra daily, PRN in vial. Patient has three bottles on hand.  Fluticasone Propionate 50 mcg Place 2 sprays into both nostrils daily. Patient has four bottles on hand.  Diltiazem '180mg'$  1 tablet daily (breakfast) Cardiology took her off as she was on two beta blockers   Patient's caregiver Holley Raring) stated patient has been taking 5 mg of OTC Melatonin at 9pm and bedtime (around 10:30 - 11:00 pm). Holley Raring is asking if Melatonin can be put in packs? Could Melatonin at 9pm be put in a pack with the Carbidopa-Levodopa 25-'100mg'$  at 9pm?  Any concerns about your medications? No  How often do you forget or accidentally miss a dose? Never  Do you use a pillbox? No  Is patient in packaging Yes  If yes  What is the date on your next pill pack? 02/18/2022  Any concerns or issues with your packaging? No  Refills requested from providers include: Gabapentin 100 mg 1 tablet at breakfast, 1 tablet at evening meal, 1 tab at bedtime  Gabapentin '100mg'$  1 capsule three times a day (1 breakfast, 1  evening meal, 1 bedtime) May take 1 extra daily, PRN in vial.  Spironolactone '25mg'$  tablet 1 tablet (breakfast)  Confirmed delivery date of 03/02/2022, advised patient that pharmacy will contact them the morning of delivery. Patient would like delivery before 1:00.  Recent blood pressure readings are as follows: Patient does not check at home.   Recent blood glucose readings are as follows: Patient does not check at home.   Annual wellness visit in last year? Yes 01/06/22 Most Recent BP reading:106/65 on 02/08/22  If Diabetic: Most recent A1C reading: 5.8 on 06/22/2021 Last eye exam / retinopathy screening: Up to date Last diabetic foot exam: Up to date  Cycle dispensing form sent to [facility], CPP for review.  Charlene Brooke, CPP notified  [name], [place] Clinical Pharmacy Assistant [phone]

## 2022-02-22 DIAGNOSIS — N6019 Diffuse cystic mastopathy of unspecified breast: Secondary | ICD-10-CM | POA: Diagnosis not present

## 2022-02-22 DIAGNOSIS — E785 Hyperlipidemia, unspecified: Secondary | ICD-10-CM | POA: Diagnosis not present

## 2022-02-22 DIAGNOSIS — Z9011 Acquired absence of right breast and nipple: Secondary | ICD-10-CM | POA: Diagnosis not present

## 2022-02-22 DIAGNOSIS — M109 Gout, unspecified: Secondary | ICD-10-CM | POA: Diagnosis not present

## 2022-02-22 DIAGNOSIS — G2581 Restless legs syndrome: Secondary | ICD-10-CM | POA: Diagnosis not present

## 2022-02-22 DIAGNOSIS — G20 Parkinson's disease: Secondary | ICD-10-CM | POA: Diagnosis not present

## 2022-02-22 DIAGNOSIS — I509 Heart failure, unspecified: Secondary | ICD-10-CM | POA: Diagnosis not present

## 2022-02-22 DIAGNOSIS — Z96653 Presence of artificial knee joint, bilateral: Secondary | ICD-10-CM | POA: Diagnosis not present

## 2022-02-22 DIAGNOSIS — K219 Gastro-esophageal reflux disease without esophagitis: Secondary | ICD-10-CM | POA: Diagnosis not present

## 2022-02-22 DIAGNOSIS — Z9181 History of falling: Secondary | ICD-10-CM | POA: Diagnosis not present

## 2022-02-22 DIAGNOSIS — I110 Hypertensive heart disease with heart failure: Secondary | ICD-10-CM | POA: Diagnosis not present

## 2022-02-22 DIAGNOSIS — I4891 Unspecified atrial fibrillation: Secondary | ICD-10-CM | POA: Diagnosis not present

## 2022-02-22 DIAGNOSIS — M5030 Other cervical disc degeneration, unspecified cervical region: Secondary | ICD-10-CM | POA: Diagnosis not present

## 2022-02-22 DIAGNOSIS — Z853 Personal history of malignant neoplasm of breast: Secondary | ICD-10-CM | POA: Diagnosis not present

## 2022-02-22 DIAGNOSIS — Z7984 Long term (current) use of oral hypoglycemic drugs: Secondary | ICD-10-CM | POA: Diagnosis not present

## 2022-02-22 DIAGNOSIS — G5602 Carpal tunnel syndrome, left upper limb: Secondary | ICD-10-CM | POA: Diagnosis not present

## 2022-02-22 DIAGNOSIS — Z9049 Acquired absence of other specified parts of digestive tract: Secondary | ICD-10-CM | POA: Diagnosis not present

## 2022-02-22 DIAGNOSIS — K5900 Constipation, unspecified: Secondary | ICD-10-CM | POA: Diagnosis not present

## 2022-02-22 DIAGNOSIS — Z90711 Acquired absence of uterus with remaining cervical stump: Secondary | ICD-10-CM | POA: Diagnosis not present

## 2022-02-22 DIAGNOSIS — M5136 Other intervertebral disc degeneration, lumbar region: Secondary | ICD-10-CM | POA: Diagnosis not present

## 2022-02-22 DIAGNOSIS — R69 Illness, unspecified: Secondary | ICD-10-CM | POA: Diagnosis not present

## 2022-02-22 DIAGNOSIS — E669 Obesity, unspecified: Secondary | ICD-10-CM | POA: Diagnosis not present

## 2022-02-22 DIAGNOSIS — G4733 Obstructive sleep apnea (adult) (pediatric): Secondary | ICD-10-CM | POA: Diagnosis not present

## 2022-02-24 DIAGNOSIS — M5030 Other cervical disc degeneration, unspecified cervical region: Secondary | ICD-10-CM | POA: Diagnosis not present

## 2022-02-24 DIAGNOSIS — N6019 Diffuse cystic mastopathy of unspecified breast: Secondary | ICD-10-CM | POA: Diagnosis not present

## 2022-02-24 DIAGNOSIS — G2581 Restless legs syndrome: Secondary | ICD-10-CM | POA: Diagnosis not present

## 2022-02-24 DIAGNOSIS — M109 Gout, unspecified: Secondary | ICD-10-CM | POA: Diagnosis not present

## 2022-02-24 DIAGNOSIS — M5136 Other intervertebral disc degeneration, lumbar region: Secondary | ICD-10-CM | POA: Diagnosis not present

## 2022-02-24 DIAGNOSIS — G20 Parkinson's disease: Secondary | ICD-10-CM | POA: Diagnosis not present

## 2022-02-24 DIAGNOSIS — R69 Illness, unspecified: Secondary | ICD-10-CM | POA: Diagnosis not present

## 2022-02-24 DIAGNOSIS — Z7984 Long term (current) use of oral hypoglycemic drugs: Secondary | ICD-10-CM | POA: Diagnosis not present

## 2022-02-24 DIAGNOSIS — I4891 Unspecified atrial fibrillation: Secondary | ICD-10-CM | POA: Diagnosis not present

## 2022-02-24 DIAGNOSIS — E669 Obesity, unspecified: Secondary | ICD-10-CM | POA: Diagnosis not present

## 2022-02-24 DIAGNOSIS — Z853 Personal history of malignant neoplasm of breast: Secondary | ICD-10-CM | POA: Diagnosis not present

## 2022-02-24 DIAGNOSIS — K5900 Constipation, unspecified: Secondary | ICD-10-CM | POA: Diagnosis not present

## 2022-02-24 DIAGNOSIS — I509 Heart failure, unspecified: Secondary | ICD-10-CM | POA: Diagnosis not present

## 2022-02-24 DIAGNOSIS — G5602 Carpal tunnel syndrome, left upper limb: Secondary | ICD-10-CM | POA: Diagnosis not present

## 2022-02-24 DIAGNOSIS — I110 Hypertensive heart disease with heart failure: Secondary | ICD-10-CM | POA: Diagnosis not present

## 2022-02-24 DIAGNOSIS — Z96653 Presence of artificial knee joint, bilateral: Secondary | ICD-10-CM | POA: Diagnosis not present

## 2022-02-24 DIAGNOSIS — Z9049 Acquired absence of other specified parts of digestive tract: Secondary | ICD-10-CM | POA: Diagnosis not present

## 2022-02-24 DIAGNOSIS — Z90711 Acquired absence of uterus with remaining cervical stump: Secondary | ICD-10-CM | POA: Diagnosis not present

## 2022-02-24 DIAGNOSIS — K219 Gastro-esophageal reflux disease without esophagitis: Secondary | ICD-10-CM | POA: Diagnosis not present

## 2022-02-24 DIAGNOSIS — E785 Hyperlipidemia, unspecified: Secondary | ICD-10-CM | POA: Diagnosis not present

## 2022-02-24 DIAGNOSIS — Z9181 History of falling: Secondary | ICD-10-CM | POA: Diagnosis not present

## 2022-02-24 DIAGNOSIS — Z9011 Acquired absence of right breast and nipple: Secondary | ICD-10-CM | POA: Diagnosis not present

## 2022-02-24 DIAGNOSIS — G4733 Obstructive sleep apnea (adult) (pediatric): Secondary | ICD-10-CM | POA: Diagnosis not present

## 2022-02-25 DIAGNOSIS — G20 Parkinson's disease: Secondary | ICD-10-CM | POA: Diagnosis not present

## 2022-02-25 DIAGNOSIS — M5030 Other cervical disc degeneration, unspecified cervical region: Secondary | ICD-10-CM | POA: Diagnosis not present

## 2022-02-25 DIAGNOSIS — Z9181 History of falling: Secondary | ICD-10-CM | POA: Diagnosis not present

## 2022-02-25 DIAGNOSIS — R69 Illness, unspecified: Secondary | ICD-10-CM | POA: Diagnosis not present

## 2022-02-25 DIAGNOSIS — M109 Gout, unspecified: Secondary | ICD-10-CM | POA: Diagnosis not present

## 2022-02-25 DIAGNOSIS — Z9011 Acquired absence of right breast and nipple: Secondary | ICD-10-CM | POA: Diagnosis not present

## 2022-02-25 DIAGNOSIS — N6019 Diffuse cystic mastopathy of unspecified breast: Secondary | ICD-10-CM | POA: Diagnosis not present

## 2022-02-25 DIAGNOSIS — Z96653 Presence of artificial knee joint, bilateral: Secondary | ICD-10-CM | POA: Diagnosis not present

## 2022-02-25 DIAGNOSIS — Z90711 Acquired absence of uterus with remaining cervical stump: Secondary | ICD-10-CM | POA: Diagnosis not present

## 2022-02-25 DIAGNOSIS — K5900 Constipation, unspecified: Secondary | ICD-10-CM | POA: Diagnosis not present

## 2022-02-25 DIAGNOSIS — G2581 Restless legs syndrome: Secondary | ICD-10-CM | POA: Diagnosis not present

## 2022-02-25 DIAGNOSIS — G5602 Carpal tunnel syndrome, left upper limb: Secondary | ICD-10-CM | POA: Diagnosis not present

## 2022-02-25 DIAGNOSIS — Z7984 Long term (current) use of oral hypoglycemic drugs: Secondary | ICD-10-CM | POA: Diagnosis not present

## 2022-02-25 DIAGNOSIS — Z853 Personal history of malignant neoplasm of breast: Secondary | ICD-10-CM | POA: Diagnosis not present

## 2022-02-25 DIAGNOSIS — K219 Gastro-esophageal reflux disease without esophagitis: Secondary | ICD-10-CM | POA: Diagnosis not present

## 2022-02-25 DIAGNOSIS — Z9049 Acquired absence of other specified parts of digestive tract: Secondary | ICD-10-CM | POA: Diagnosis not present

## 2022-02-25 DIAGNOSIS — E785 Hyperlipidemia, unspecified: Secondary | ICD-10-CM | POA: Diagnosis not present

## 2022-02-25 DIAGNOSIS — E669 Obesity, unspecified: Secondary | ICD-10-CM | POA: Diagnosis not present

## 2022-02-25 DIAGNOSIS — I509 Heart failure, unspecified: Secondary | ICD-10-CM | POA: Diagnosis not present

## 2022-02-25 DIAGNOSIS — I4891 Unspecified atrial fibrillation: Secondary | ICD-10-CM | POA: Diagnosis not present

## 2022-02-25 DIAGNOSIS — I110 Hypertensive heart disease with heart failure: Secondary | ICD-10-CM | POA: Diagnosis not present

## 2022-02-25 DIAGNOSIS — G4733 Obstructive sleep apnea (adult) (pediatric): Secondary | ICD-10-CM | POA: Diagnosis not present

## 2022-02-25 DIAGNOSIS — M5136 Other intervertebral disc degeneration, lumbar region: Secondary | ICD-10-CM | POA: Diagnosis not present

## 2022-02-28 DIAGNOSIS — F330 Major depressive disorder, recurrent, mild: Secondary | ICD-10-CM | POA: Diagnosis not present

## 2022-02-28 DIAGNOSIS — F4312 Post-traumatic stress disorder, chronic: Secondary | ICD-10-CM | POA: Diagnosis not present

## 2022-02-28 DIAGNOSIS — R69 Illness, unspecified: Secondary | ICD-10-CM | POA: Diagnosis not present

## 2022-03-01 DIAGNOSIS — Z90711 Acquired absence of uterus with remaining cervical stump: Secondary | ICD-10-CM | POA: Diagnosis not present

## 2022-03-01 DIAGNOSIS — N6019 Diffuse cystic mastopathy of unspecified breast: Secondary | ICD-10-CM | POA: Diagnosis not present

## 2022-03-01 DIAGNOSIS — E785 Hyperlipidemia, unspecified: Secondary | ICD-10-CM | POA: Diagnosis not present

## 2022-03-01 DIAGNOSIS — I110 Hypertensive heart disease with heart failure: Secondary | ICD-10-CM | POA: Diagnosis not present

## 2022-03-01 DIAGNOSIS — M5136 Other intervertebral disc degeneration, lumbar region: Secondary | ICD-10-CM | POA: Diagnosis not present

## 2022-03-01 DIAGNOSIS — K219 Gastro-esophageal reflux disease without esophagitis: Secondary | ICD-10-CM | POA: Diagnosis not present

## 2022-03-01 DIAGNOSIS — Z7984 Long term (current) use of oral hypoglycemic drugs: Secondary | ICD-10-CM | POA: Diagnosis not present

## 2022-03-01 DIAGNOSIS — M5030 Other cervical disc degeneration, unspecified cervical region: Secondary | ICD-10-CM | POA: Diagnosis not present

## 2022-03-01 DIAGNOSIS — G5602 Carpal tunnel syndrome, left upper limb: Secondary | ICD-10-CM | POA: Diagnosis not present

## 2022-03-01 DIAGNOSIS — K5900 Constipation, unspecified: Secondary | ICD-10-CM | POA: Diagnosis not present

## 2022-03-01 DIAGNOSIS — G2581 Restless legs syndrome: Secondary | ICD-10-CM | POA: Diagnosis not present

## 2022-03-01 DIAGNOSIS — G4733 Obstructive sleep apnea (adult) (pediatric): Secondary | ICD-10-CM | POA: Diagnosis not present

## 2022-03-01 DIAGNOSIS — Z9181 History of falling: Secondary | ICD-10-CM | POA: Diagnosis not present

## 2022-03-01 DIAGNOSIS — R69 Illness, unspecified: Secondary | ICD-10-CM | POA: Diagnosis not present

## 2022-03-01 DIAGNOSIS — Z9011 Acquired absence of right breast and nipple: Secondary | ICD-10-CM | POA: Diagnosis not present

## 2022-03-01 DIAGNOSIS — M109 Gout, unspecified: Secondary | ICD-10-CM | POA: Diagnosis not present

## 2022-03-01 DIAGNOSIS — G20 Parkinson's disease: Secondary | ICD-10-CM | POA: Diagnosis not present

## 2022-03-01 DIAGNOSIS — Z9049 Acquired absence of other specified parts of digestive tract: Secondary | ICD-10-CM | POA: Diagnosis not present

## 2022-03-01 DIAGNOSIS — E669 Obesity, unspecified: Secondary | ICD-10-CM | POA: Diagnosis not present

## 2022-03-01 DIAGNOSIS — I4891 Unspecified atrial fibrillation: Secondary | ICD-10-CM | POA: Diagnosis not present

## 2022-03-01 DIAGNOSIS — Z96653 Presence of artificial knee joint, bilateral: Secondary | ICD-10-CM | POA: Diagnosis not present

## 2022-03-01 DIAGNOSIS — I509 Heart failure, unspecified: Secondary | ICD-10-CM | POA: Diagnosis not present

## 2022-03-01 DIAGNOSIS — Z853 Personal history of malignant neoplasm of breast: Secondary | ICD-10-CM | POA: Diagnosis not present

## 2022-03-03 DIAGNOSIS — K219 Gastro-esophageal reflux disease without esophagitis: Secondary | ICD-10-CM | POA: Diagnosis not present

## 2022-03-03 DIAGNOSIS — I4891 Unspecified atrial fibrillation: Secondary | ICD-10-CM | POA: Diagnosis not present

## 2022-03-03 DIAGNOSIS — G4733 Obstructive sleep apnea (adult) (pediatric): Secondary | ICD-10-CM | POA: Diagnosis not present

## 2022-03-03 DIAGNOSIS — Z9181 History of falling: Secondary | ICD-10-CM | POA: Diagnosis not present

## 2022-03-03 DIAGNOSIS — M109 Gout, unspecified: Secondary | ICD-10-CM | POA: Diagnosis not present

## 2022-03-03 DIAGNOSIS — Z90711 Acquired absence of uterus with remaining cervical stump: Secondary | ICD-10-CM | POA: Diagnosis not present

## 2022-03-03 DIAGNOSIS — Z7984 Long term (current) use of oral hypoglycemic drugs: Secondary | ICD-10-CM | POA: Diagnosis not present

## 2022-03-03 DIAGNOSIS — Z9049 Acquired absence of other specified parts of digestive tract: Secondary | ICD-10-CM | POA: Diagnosis not present

## 2022-03-03 DIAGNOSIS — E785 Hyperlipidemia, unspecified: Secondary | ICD-10-CM | POA: Diagnosis not present

## 2022-03-03 DIAGNOSIS — R69 Illness, unspecified: Secondary | ICD-10-CM | POA: Diagnosis not present

## 2022-03-03 DIAGNOSIS — I110 Hypertensive heart disease with heart failure: Secondary | ICD-10-CM | POA: Diagnosis not present

## 2022-03-03 DIAGNOSIS — Z9011 Acquired absence of right breast and nipple: Secondary | ICD-10-CM | POA: Diagnosis not present

## 2022-03-03 DIAGNOSIS — Z96653 Presence of artificial knee joint, bilateral: Secondary | ICD-10-CM | POA: Diagnosis not present

## 2022-03-03 DIAGNOSIS — G5602 Carpal tunnel syndrome, left upper limb: Secondary | ICD-10-CM | POA: Diagnosis not present

## 2022-03-03 DIAGNOSIS — M5136 Other intervertebral disc degeneration, lumbar region: Secondary | ICD-10-CM | POA: Diagnosis not present

## 2022-03-03 DIAGNOSIS — K5900 Constipation, unspecified: Secondary | ICD-10-CM | POA: Diagnosis not present

## 2022-03-03 DIAGNOSIS — G2581 Restless legs syndrome: Secondary | ICD-10-CM | POA: Diagnosis not present

## 2022-03-03 DIAGNOSIS — M5030 Other cervical disc degeneration, unspecified cervical region: Secondary | ICD-10-CM | POA: Diagnosis not present

## 2022-03-03 DIAGNOSIS — I509 Heart failure, unspecified: Secondary | ICD-10-CM | POA: Diagnosis not present

## 2022-03-03 DIAGNOSIS — N6019 Diffuse cystic mastopathy of unspecified breast: Secondary | ICD-10-CM | POA: Diagnosis not present

## 2022-03-03 DIAGNOSIS — G20 Parkinson's disease: Secondary | ICD-10-CM | POA: Diagnosis not present

## 2022-03-03 DIAGNOSIS — Z853 Personal history of malignant neoplasm of breast: Secondary | ICD-10-CM | POA: Diagnosis not present

## 2022-03-03 DIAGNOSIS — E669 Obesity, unspecified: Secondary | ICD-10-CM | POA: Diagnosis not present

## 2022-03-07 ENCOUNTER — Telehealth: Payer: Self-pay | Admitting: Cardiology

## 2022-03-07 NOTE — Telephone Encounter (Signed)
Returned call to [employer] left message on personal voice mail to cal back.

## 2022-03-07 NOTE — Telephone Encounter (Signed)
Pt c/o swelling: STAT is pt has developed SOB within 24 hours  If swelling, where is the swelling located? LE in both legs, ankles, and feet  How much weight have you gained and in what time span? No   Have you gained 3 pounds in a day or 5 pounds in a week? Yes   Do you have a log of your daily weights (if so, list)? No   Are you currently taking a fluid pill? Yes   Are you currently SOB? No   Have you traveled recently? No

## 2022-03-08 DIAGNOSIS — Z853 Personal history of malignant neoplasm of breast: Secondary | ICD-10-CM | POA: Diagnosis not present

## 2022-03-08 DIAGNOSIS — Z9049 Acquired absence of other specified parts of digestive tract: Secondary | ICD-10-CM | POA: Diagnosis not present

## 2022-03-08 DIAGNOSIS — R69 Illness, unspecified: Secondary | ICD-10-CM | POA: Diagnosis not present

## 2022-03-08 DIAGNOSIS — N6019 Diffuse cystic mastopathy of unspecified breast: Secondary | ICD-10-CM | POA: Diagnosis not present

## 2022-03-08 DIAGNOSIS — I4891 Unspecified atrial fibrillation: Secondary | ICD-10-CM | POA: Diagnosis not present

## 2022-03-08 DIAGNOSIS — G2581 Restless legs syndrome: Secondary | ICD-10-CM | POA: Diagnosis not present

## 2022-03-08 DIAGNOSIS — M109 Gout, unspecified: Secondary | ICD-10-CM | POA: Diagnosis not present

## 2022-03-08 DIAGNOSIS — M5136 Other intervertebral disc degeneration, lumbar region: Secondary | ICD-10-CM | POA: Diagnosis not present

## 2022-03-08 DIAGNOSIS — G5602 Carpal tunnel syndrome, left upper limb: Secondary | ICD-10-CM | POA: Diagnosis not present

## 2022-03-08 DIAGNOSIS — K5900 Constipation, unspecified: Secondary | ICD-10-CM | POA: Diagnosis not present

## 2022-03-08 DIAGNOSIS — K219 Gastro-esophageal reflux disease without esophagitis: Secondary | ICD-10-CM | POA: Diagnosis not present

## 2022-03-08 DIAGNOSIS — E785 Hyperlipidemia, unspecified: Secondary | ICD-10-CM | POA: Diagnosis not present

## 2022-03-08 DIAGNOSIS — Z9181 History of falling: Secondary | ICD-10-CM | POA: Diagnosis not present

## 2022-03-08 DIAGNOSIS — E669 Obesity, unspecified: Secondary | ICD-10-CM | POA: Diagnosis not present

## 2022-03-08 DIAGNOSIS — I509 Heart failure, unspecified: Secondary | ICD-10-CM | POA: Diagnosis not present

## 2022-03-08 DIAGNOSIS — Z7984 Long term (current) use of oral hypoglycemic drugs: Secondary | ICD-10-CM | POA: Diagnosis not present

## 2022-03-08 DIAGNOSIS — G4733 Obstructive sleep apnea (adult) (pediatric): Secondary | ICD-10-CM | POA: Diagnosis not present

## 2022-03-08 DIAGNOSIS — Z9011 Acquired absence of right breast and nipple: Secondary | ICD-10-CM | POA: Diagnosis not present

## 2022-03-08 DIAGNOSIS — Z90711 Acquired absence of uterus with remaining cervical stump: Secondary | ICD-10-CM | POA: Diagnosis not present

## 2022-03-08 DIAGNOSIS — I110 Hypertensive heart disease with heart failure: Secondary | ICD-10-CM | POA: Diagnosis not present

## 2022-03-08 DIAGNOSIS — M5030 Other cervical disc degeneration, unspecified cervical region: Secondary | ICD-10-CM | POA: Diagnosis not present

## 2022-03-08 DIAGNOSIS — G20 Parkinson's disease: Secondary | ICD-10-CM | POA: Diagnosis not present

## 2022-03-08 DIAGNOSIS — Z96653 Presence of artificial knee joint, bilateral: Secondary | ICD-10-CM | POA: Diagnosis not present

## 2022-03-10 DIAGNOSIS — G5602 Carpal tunnel syndrome, left upper limb: Secondary | ICD-10-CM | POA: Diagnosis not present

## 2022-03-10 DIAGNOSIS — I110 Hypertensive heart disease with heart failure: Secondary | ICD-10-CM | POA: Diagnosis not present

## 2022-03-10 DIAGNOSIS — K219 Gastro-esophageal reflux disease without esophagitis: Secondary | ICD-10-CM | POA: Diagnosis not present

## 2022-03-10 DIAGNOSIS — G2581 Restless legs syndrome: Secondary | ICD-10-CM | POA: Diagnosis not present

## 2022-03-10 DIAGNOSIS — Z9011 Acquired absence of right breast and nipple: Secondary | ICD-10-CM | POA: Diagnosis not present

## 2022-03-10 DIAGNOSIS — K5900 Constipation, unspecified: Secondary | ICD-10-CM | POA: Diagnosis not present

## 2022-03-10 DIAGNOSIS — Z90711 Acquired absence of uterus with remaining cervical stump: Secondary | ICD-10-CM | POA: Diagnosis not present

## 2022-03-10 DIAGNOSIS — I4891 Unspecified atrial fibrillation: Secondary | ICD-10-CM | POA: Diagnosis not present

## 2022-03-10 DIAGNOSIS — M109 Gout, unspecified: Secondary | ICD-10-CM | POA: Diagnosis not present

## 2022-03-10 DIAGNOSIS — G4733 Obstructive sleep apnea (adult) (pediatric): Secondary | ICD-10-CM | POA: Diagnosis not present

## 2022-03-10 DIAGNOSIS — E785 Hyperlipidemia, unspecified: Secondary | ICD-10-CM | POA: Diagnosis not present

## 2022-03-10 DIAGNOSIS — Z96653 Presence of artificial knee joint, bilateral: Secondary | ICD-10-CM | POA: Diagnosis not present

## 2022-03-10 DIAGNOSIS — G20 Parkinson's disease: Secondary | ICD-10-CM | POA: Diagnosis not present

## 2022-03-10 DIAGNOSIS — I509 Heart failure, unspecified: Secondary | ICD-10-CM | POA: Diagnosis not present

## 2022-03-10 DIAGNOSIS — Z9049 Acquired absence of other specified parts of digestive tract: Secondary | ICD-10-CM | POA: Diagnosis not present

## 2022-03-10 DIAGNOSIS — M5136 Other intervertebral disc degeneration, lumbar region: Secondary | ICD-10-CM | POA: Diagnosis not present

## 2022-03-10 DIAGNOSIS — R69 Illness, unspecified: Secondary | ICD-10-CM | POA: Diagnosis not present

## 2022-03-10 DIAGNOSIS — Z7984 Long term (current) use of oral hypoglycemic drugs: Secondary | ICD-10-CM | POA: Diagnosis not present

## 2022-03-10 DIAGNOSIS — E669 Obesity, unspecified: Secondary | ICD-10-CM | POA: Diagnosis not present

## 2022-03-10 DIAGNOSIS — Z9181 History of falling: Secondary | ICD-10-CM | POA: Diagnosis not present

## 2022-03-10 DIAGNOSIS — N6019 Diffuse cystic mastopathy of unspecified breast: Secondary | ICD-10-CM | POA: Diagnosis not present

## 2022-03-10 DIAGNOSIS — M5030 Other cervical disc degeneration, unspecified cervical region: Secondary | ICD-10-CM | POA: Diagnosis not present

## 2022-03-10 DIAGNOSIS — Z853 Personal history of malignant neoplasm of breast: Secondary | ICD-10-CM | POA: Diagnosis not present

## 2022-03-11 NOTE — Telephone Encounter (Signed)
Left message for pt to call.

## 2022-03-16 ENCOUNTER — Encounter: Payer: Self-pay | Admitting: Primary Care

## 2022-03-16 ENCOUNTER — Ambulatory Visit (INDEPENDENT_AMBULATORY_CARE_PROVIDER_SITE_OTHER): Payer: Medicare HMO | Admitting: Primary Care

## 2022-03-16 VITALS — BP 110/68 | HR 60 | Temp 98.6°F | Ht 62.0 in | Wt 145.0 lb

## 2022-03-16 DIAGNOSIS — Z9181 History of falling: Secondary | ICD-10-CM | POA: Diagnosis not present

## 2022-03-16 DIAGNOSIS — R21 Rash and other nonspecific skin eruption: Secondary | ICD-10-CM | POA: Diagnosis not present

## 2022-03-16 DIAGNOSIS — L98492 Non-pressure chronic ulcer of skin of other sites with fat layer exposed: Secondary | ICD-10-CM | POA: Insufficient documentation

## 2022-03-16 DIAGNOSIS — Z853 Personal history of malignant neoplasm of breast: Secondary | ICD-10-CM | POA: Diagnosis not present

## 2022-03-16 DIAGNOSIS — K5900 Constipation, unspecified: Secondary | ICD-10-CM | POA: Diagnosis not present

## 2022-03-16 DIAGNOSIS — G20 Parkinson's disease: Secondary | ICD-10-CM | POA: Diagnosis not present

## 2022-03-16 DIAGNOSIS — G2581 Restless legs syndrome: Secondary | ICD-10-CM | POA: Diagnosis not present

## 2022-03-16 DIAGNOSIS — Z9011 Acquired absence of right breast and nipple: Secondary | ICD-10-CM | POA: Diagnosis not present

## 2022-03-16 DIAGNOSIS — I509 Heart failure, unspecified: Secondary | ICD-10-CM | POA: Diagnosis not present

## 2022-03-16 DIAGNOSIS — M5136 Other intervertebral disc degeneration, lumbar region: Secondary | ICD-10-CM | POA: Diagnosis not present

## 2022-03-16 DIAGNOSIS — G4733 Obstructive sleep apnea (adult) (pediatric): Secondary | ICD-10-CM | POA: Diagnosis not present

## 2022-03-16 DIAGNOSIS — Z9049 Acquired absence of other specified parts of digestive tract: Secondary | ICD-10-CM | POA: Diagnosis not present

## 2022-03-16 DIAGNOSIS — Z96653 Presence of artificial knee joint, bilateral: Secondary | ICD-10-CM | POA: Diagnosis not present

## 2022-03-16 DIAGNOSIS — G5602 Carpal tunnel syndrome, left upper limb: Secondary | ICD-10-CM | POA: Diagnosis not present

## 2022-03-16 DIAGNOSIS — K219 Gastro-esophageal reflux disease without esophagitis: Secondary | ICD-10-CM | POA: Diagnosis not present

## 2022-03-16 DIAGNOSIS — Z90711 Acquired absence of uterus with remaining cervical stump: Secondary | ICD-10-CM | POA: Diagnosis not present

## 2022-03-16 DIAGNOSIS — E669 Obesity, unspecified: Secondary | ICD-10-CM | POA: Diagnosis not present

## 2022-03-16 DIAGNOSIS — I110 Hypertensive heart disease with heart failure: Secondary | ICD-10-CM | POA: Diagnosis not present

## 2022-03-16 DIAGNOSIS — Z7984 Long term (current) use of oral hypoglycemic drugs: Secondary | ICD-10-CM | POA: Diagnosis not present

## 2022-03-16 DIAGNOSIS — E785 Hyperlipidemia, unspecified: Secondary | ICD-10-CM | POA: Diagnosis not present

## 2022-03-16 DIAGNOSIS — M109 Gout, unspecified: Secondary | ICD-10-CM | POA: Diagnosis not present

## 2022-03-16 DIAGNOSIS — R69 Illness, unspecified: Secondary | ICD-10-CM | POA: Diagnosis not present

## 2022-03-16 DIAGNOSIS — I4891 Unspecified atrial fibrillation: Secondary | ICD-10-CM | POA: Diagnosis not present

## 2022-03-16 DIAGNOSIS — N6019 Diffuse cystic mastopathy of unspecified breast: Secondary | ICD-10-CM | POA: Diagnosis not present

## 2022-03-16 DIAGNOSIS — M5030 Other cervical disc degeneration, unspecified cervical region: Secondary | ICD-10-CM | POA: Diagnosis not present

## 2022-03-16 MED ORDER — NYSTATIN 100000 UNIT/GM EX CREA: 1.0000 | TOPICAL_CREAM | Freq: Two times a day (BID) | CUTANEOUS | 0 refills | Status: DC

## 2022-03-16 NOTE — Assessment & Plan Note (Signed)
Noted on exam today.  Long discussion regarding the need to avoid consistent sitting and pressure on the buttocks. Discussed use of donut pillow, walking, continued exercise.  Referral placed for home health nursing to monitor and treat.

## 2022-03-16 NOTE — Patient Instructions (Signed)
Start Nystatin cream. Apply twice daily for one week then as needed for rash. Try to keep the buttocks dry and clean.  Avoid sitting for prolonged periods of time. Shift your weight when sitting to relieve pressure on your bottom.  You will be contacted regarding your referral to home health nursing.  Please let us know if you have not been contacted within 3-4 days.  It was a pleasure to see you today!

## 2022-03-16 NOTE — Telephone Encounter (Signed)
Left message for pt to call.

## 2022-03-16 NOTE — Progress Notes (Signed)
Subjective:    Patient ID: Selena Bush, female    DOB: Aug 21, 1945, 76 y.o.   MRN: [id number]  Rash Pertinent negatives include no fever.    Selena Bush is a very pleasant 76 y.o. female with a significant medical history including hypertension, CHF, paroxysmal atrial fibrillation, Parkinson's disease, OSA, GERD, prediabetes who presents today to discuss rash.  Her rash is located to the bilateral buttocks for which she describes as sore, burning, and occasionally itching. Symptoms began about 1-2 months ago, worse over the last 1 week.   Her friend noticed a sore to the right lower buttocks. Her friend has applied metronidazole cream without much improvement. She does have a history of yeast rash under the breasts so her friend applied some "medical powder" to the buttocks with temporary improvement.   She is mostly sedentary throughout the day, has been sitting a lot over the last few months. About one week ago she began getting up more frequently, she's been exercising and working with physical and occupational therapy.    Review of Systems  Constitutional:  Negative for fever.  Skin:  Positive for rash.         Past Medical History:  Diagnosis Date   Anxiety    Atrial fibrillation ([place])    Cat bite of right hand 03/25/2019   Chronic diastolic CHF (congestive heart failure) ([place])    a. echo 09/2014: EF 49-70%, diastolic dysfunction, mild LVH, nl RV size & systolic function, mildly dilated LA (4.3 cm), mild MR/TR, mildly elevated PASP 36.7 mm Hg   DDD (degenerative disc disease), cervical    DDD (degenerative disc disease), lumbar    Depression    Diffuse cystic mastopathy 2014   Dyspnea    Dysrhythmia    GERD (gastroesophageal reflux disease)    Gout    Headache    rare   HLD (hyperlipidemia)    a. statin intolerant 2/2 myalgias   Hx of dysplastic nevus 12/25/2018   L medial ankle   Hypercholesterolemia    Hypertension    Malignant neoplasm of upper-outer  quadrant of female breast ([place]) 10/2012   Papillary DCIS, sentinel node negative. DR/PR positive. PARTIAL RIGHT MASTECTOMY FOR BREAST CANCER--HAD RADIATION - NO CHEMO --DR. CRYSTAL [place] ONCOLOGIST   Obesity    OSA on CPAP    Osteoarthritis of both knees    a. s/p right TKA 04/2013 & left TKA 09/2014   Otitis externa    Parkinson disease [facility])    Personal history of radiation therapy 2015   RIGHT breast-mammosite per pt   Pneumonia of both lungs due to infectious organism 01/08/2021   Sleep difficulties    LUNESTA HAS HELPED   Vaginal cyst     Social History   Socioeconomic History   Marital status: Widowed    Spouse name: Chrissie Noa   Number of children: 2   Years of education: College   Highest education level: Bachelor's degree (e.g., BA, AB, BS)  Occupational History   Occupation: Retired  Tobacco Use   Smoking status: Never   Smokeless tobacco: Never   Tobacco comments:    social smoker as a teen  Scientific laboratory technician Use: Never used  Substance and Sexual Activity   Alcohol use: No   Drug use: No   Sexual activity: Not Currently    Birth control/protection: Post-menopausal, Surgical  Other Topics Concern   Not on file  Social History Narrative   Marital status: widowed since 09/16/2015  Children: 2 children (45, 40); 5 grandchild (4 in [place]; 1 in [place]).      Lives: alone in townhome; 1 dog, 2 cats      Employment: psychiatric Education officer, museum; retired in 2015      Tobacco: teenager only      Alcohol:  None      Drugs: none      Exercise: walking in 2019; more active in 2019.  Walking daily small amounts.        ADLs: drives; independent with ADLs; no assistant devices      Advanced Directives: none; FULL CODE; no prolonged measures.   Does not need them.  Daughter/Heather Vista Deck is HCPOA.    Social Determinants of Health   Financial Resource Strain: Low Risk  (01/06/2022)   Overall Financial Resource Strain (CARDIA)    Difficulty of Paying Living Expenses:  Not hard at all  Food Insecurity: No Food Insecurity (01/06/2022)   Hunger Vital Sign    Worried About Running Out of Food in the Last Year: Never true    Ran Out of Food in the Last Year: Never true  Transportation Needs: No Transportation Needs (01/06/2022)   PRAPARE - Hydrologist (Medical): No    Lack of Transportation (Non-Medical): No  Physical Activity: Insufficiently Active (01/06/2022)   Exercise Vital Sign    Days of Exercise per Week: 7 days    Minutes of Exercise per Session: 10 min  Stress: No Stress Concern Present (01/06/2022)   [place]    Feeling of Stress : Not at all  Social Connections: Moderately Integrated (10/02/2017)   Social Connection and Isolation Panel [NHANES]    Frequency of Communication with Friends and Family: More than three times a week    Frequency of Social Gatherings with Friends and Family: More than three times a week    Attends Religious Services: More than 4 times per year    Active Member of [employer] or Organizations: Yes    Attends Archivist Meetings: More than 4 times per year    Marital Status: Widowed  Intimate Partner Violence: Not At Risk (10/02/2017)   Humiliation, Afraid, Rape, and Kick questionnaire    Fear of Current or Ex-Partner: No    Emotionally Abused: No    Physically Abused: No    Sexually Abused: No    Past Surgical History:  Procedure Laterality Date   ABDOMINAL HYSTERECTOMY  1992   DUB; fibroids; endometriosis.  One remaining ovary.     BREAST LUMPECTOMY Right 2015   Papillary DCIS, sentinel node negative. DR/PR positive. PARTIAL RIGHT MASTECTOMY FOR BREAST CANCER--HAD RADIATION - NO CHEMO --DR. CRYSTAL [place] ONCOLOGIST   BREAST SURGERY Right 10/2012   Wide excision,APB RT 10 mm papillary DCIS, ER/PR positive. Sentinel node negative. Partial breast radiation.   CATARACT EXTRACTION, BILATERAL  02/13/2016   [place].    CHOLECYSTECTOMY  1994   COLONOSCOPY  2015   1 benign polyp-every 5 years/ Dr Candace Cruise   COLONOSCOPY WITH PROPOFOL N/A 02/10/2021   Procedure: COLONOSCOPY WITH PROPOFOL;  Surgeon: Robert Bellow, MD;  Location: [facility] ENDOSCOPY;  Service: Endoscopy;  Laterality: N/A;   ERCP  1995   JOINT REPLACEMENT Right 04/2013   knee   PERICARDIOCENTESIS N/A 11/13/2017   Procedure: PERICARDIOCENTESIS;  Surgeon: Nelva Bush, MD;  Location: [facility] CV LAB;  Service: Cardiovascular;  Laterality: N/A;   TOTAL KNEE ARTHROPLASTY Right 04/16/2013  Procedure: RIGHT TOTAL KNEE ARTHROPLASTY;  Surgeon: Mauri Pole, MD;  Location: [facility] ORS;  Service: Orthopedics;  Laterality: Right;   TOTAL KNEE ARTHROPLASTY Left 09/15/2014   Procedure: LEFT TOTAL KNEE ARTHROPLASTY;  Surgeon: Mauri Pole, MD;  Location: [facility] ORS;  Service: Orthopedics;  Laterality: Left;   TUBAL LIGATION  1979   UPPER GI ENDOSCOPY      Family History  Problem Relation Age of Onset   Colon cancer Mother    Cancer Mother 75       colon cancer   Melanoma Father    Cancer Father 44       melanoma   Colon cancer Maternal Grandfather    Breast cancer Maternal Grandfather    Melanoma Sister    Melanoma Sister     Allergies  Allergen Reactions   Penicillins Anaphylaxis    anaphylaxis  Has patient had a PCN reaction causing immediate rash, facial/tongue/throat swelling, SOB or lightheadedness with hypotension: Yes Has patient had a PCN reaction causing severe rash involving mucus membranes or skin necrosis: No Has patient had a PCN reaction that required hospitalization: No Has patient had a PCN reaction occurring within the last 10 years: No If all of the above answers are "NO", then may proceed with Cephalosporin use.    Sulfa Antibiotics Anaphylaxis   Codeine Other (See Comments) and Nausea And Vomiting    Gi problems    Statins Other (See Comments)    Leg pains   Aspirin Other (See Comments)    "burned my stomach  intensely" Abdominal pain and burning    Current Outpatient Medications on File Prior to Visit  Medication Sig Dispense Refill   acetaminophen (TYLENOL) 500 MG tablet Take by mouth.     carbidopa-levodopa (SINEMET IR) 25-100 MG tablet Take 2 tablets at 6 AM, 1 at 9AM, 1 tab at 12 PM,  2 at 3 PM, 1 at 6 PM and 2 at 9 PM.     Carbidopa-Levodopa ER (SINEMET CR) 25-100 MG tablet controlled release Take 1 tab at 9pm     Cholecalciferol (VITAMIN D-3) 25 MCG (1000 UT) CAPS Take 1 capsule by mouth daily.     Cyanocobalamin (VITAMIN B-12) 5000 MCG SUBL Place 1 each under the tongue daily.     DULoxetine (CYMBALTA) 60 MG capsule TAKE ONE CAPSULE BY MOUTH TWICE DAILY FOR anxiety AND depression 180 capsule 2   ezetimibe (ZETIA) 10 MG tablet TAKE ONE TABLET BY MOUTH EVERY MORNING 90 tablet 3   fluticasone (FLONASE) 50 MCG/ACT nasal spray Place 2 sprays into both nostrils daily as needed for rhinitis or allergies. 48 g 3   furosemide (LASIX) 20 MG tablet Take 1 tablet (20 mg total) by mouth every morning. 90 tablet 3   furosemide (LASIX) 20 MG tablet TAKE ONE TABLET BY MOUTH ONCE DAILY May take additional tablet daily if needed 180 tablet 3   gabapentin (NEURONTIN) 600 MG tablet TAKE ONE TABLET BY MOUTH EVERYDAY AT BEDTIME     hydrocortisone 2.5 % cream Apply to affected under breasts 1-2 times a day as directed and as needed for itch. Can also use on face as needed for itch 28 g 3   JARDIANCE 10 MG TABS tablet TAKE ONE TABLET BY MOUTH BEFORE BREAKFAST 30 tablet 5   ketoconazole (NIZORAL) 2 % cream Apply to affected areas under breast twice daily for rash until healed. Can also use on face as needed for dermatitis 30 g 5   metoprolol tartrate (  LOPRESSOR) 25 MG tablet TAKE THREE TABLETS BY MOUTH TWICE DAILY 540 tablet 1   oxybutynin (DITROPAN) 5 MG tablet Take 5 mg by mouth 2 (two) times daily.     sotalol (BETAPACE) 120 MG tablet TAKE ONE TABLET BY MOUTH TWICE DAILY 180 tablet 3   spironolactone (ALDACTONE)  25 MG tablet Take 1 tablet (25 mg total) by mouth daily. 90 tablet 1   tiZANidine (ZANAFLEX) 4 MG tablet TAKE 2 TABLET (4 MG TOTAL) BY MOUTH DAILY FOR MUSCLE SPASMS AT BEDTIME. 180 tablet 3   furosemide (LASIX) 20 MG tablet Take 1 tablet (20 mg total) by mouth as needed. If needed above your regular daily dose of furosemide 90 tablet 3   No current facility-administered medications on file prior to visit.    BP 110/68   Pulse 60   Temp 98.6 F (37 C) (Oral)   Ht '5\' 2"'$  (1.575 m)   Wt 145 lb (65.8 kg)   SpO2 95%   BMI 26.52 kg/m  Objective:   Physical Exam Constitutional:      General: She is not in acute distress. Pulmonary:     Effort: Pulmonary effort is normal.  Skin:    Findings: Rash present.     Comments: 1 cm circular skin ulcer noted to right lower medial buttocks with fat layer exposed.   Generalized erythema to gluteal folds.   Neurological:     Mental Status: She is oriented to person, place, and time.  Psychiatric:        Mood and Affect: Mood normal.           Assessment & Plan:   Problem List Items Addressed This Visit       Musculoskeletal and Integument   Skin ulcer with fat layer exposed ([place]) - Primary    Noted on exam today.  Long discussion regarding the need to avoid consistent sitting and pressure on the buttocks. Discussed use of donut pillow, walking, continued exercise.  Referral placed for home health nursing to monitor and treat.      Relevant Orders   Ambulatory referral to [place]   Other Visit Diagnoses     Rash and nonspecific skin eruption       Relevant Medications   nystatin cream (MYCOSTATIN)          Pleas Koch, NP

## 2022-03-18 DIAGNOSIS — Z9181 History of falling: Secondary | ICD-10-CM | POA: Diagnosis not present

## 2022-03-18 DIAGNOSIS — Z853 Personal history of malignant neoplasm of breast: Secondary | ICD-10-CM | POA: Diagnosis not present

## 2022-03-18 DIAGNOSIS — M5030 Other cervical disc degeneration, unspecified cervical region: Secondary | ICD-10-CM | POA: Diagnosis not present

## 2022-03-18 DIAGNOSIS — M5136 Other intervertebral disc degeneration, lumbar region: Secondary | ICD-10-CM | POA: Diagnosis not present

## 2022-03-18 DIAGNOSIS — I110 Hypertensive heart disease with heart failure: Secondary | ICD-10-CM | POA: Diagnosis not present

## 2022-03-18 DIAGNOSIS — I4891 Unspecified atrial fibrillation: Secondary | ICD-10-CM | POA: Diagnosis not present

## 2022-03-18 DIAGNOSIS — Z90711 Acquired absence of uterus with remaining cervical stump: Secondary | ICD-10-CM | POA: Diagnosis not present

## 2022-03-18 DIAGNOSIS — G4733 Obstructive sleep apnea (adult) (pediatric): Secondary | ICD-10-CM | POA: Diagnosis not present

## 2022-03-18 DIAGNOSIS — N6019 Diffuse cystic mastopathy of unspecified breast: Secondary | ICD-10-CM | POA: Diagnosis not present

## 2022-03-18 DIAGNOSIS — K5900 Constipation, unspecified: Secondary | ICD-10-CM | POA: Diagnosis not present

## 2022-03-18 DIAGNOSIS — E785 Hyperlipidemia, unspecified: Secondary | ICD-10-CM | POA: Diagnosis not present

## 2022-03-18 DIAGNOSIS — Z9049 Acquired absence of other specified parts of digestive tract: Secondary | ICD-10-CM | POA: Diagnosis not present

## 2022-03-18 DIAGNOSIS — I509 Heart failure, unspecified: Secondary | ICD-10-CM | POA: Diagnosis not present

## 2022-03-18 DIAGNOSIS — Z96653 Presence of artificial knee joint, bilateral: Secondary | ICD-10-CM | POA: Diagnosis not present

## 2022-03-18 DIAGNOSIS — E669 Obesity, unspecified: Secondary | ICD-10-CM | POA: Diagnosis not present

## 2022-03-18 DIAGNOSIS — G5602 Carpal tunnel syndrome, left upper limb: Secondary | ICD-10-CM | POA: Diagnosis not present

## 2022-03-18 DIAGNOSIS — Z7984 Long term (current) use of oral hypoglycemic drugs: Secondary | ICD-10-CM | POA: Diagnosis not present

## 2022-03-18 DIAGNOSIS — G2581 Restless legs syndrome: Secondary | ICD-10-CM | POA: Diagnosis not present

## 2022-03-18 DIAGNOSIS — K219 Gastro-esophageal reflux disease without esophagitis: Secondary | ICD-10-CM | POA: Diagnosis not present

## 2022-03-18 DIAGNOSIS — Z9011 Acquired absence of right breast and nipple: Secondary | ICD-10-CM | POA: Diagnosis not present

## 2022-03-18 DIAGNOSIS — M109 Gout, unspecified: Secondary | ICD-10-CM | POA: Diagnosis not present

## 2022-03-18 DIAGNOSIS — R69 Illness, unspecified: Secondary | ICD-10-CM | POA: Diagnosis not present

## 2022-03-18 DIAGNOSIS — G20 Parkinson's disease: Secondary | ICD-10-CM | POA: Diagnosis not present

## 2022-03-21 ENCOUNTER — Telehealth: Payer: Self-pay | Admitting: Primary Care

## 2022-03-21 ENCOUNTER — Telehealth: Payer: Self-pay

## 2022-03-21 DIAGNOSIS — M5136 Other intervertebral disc degeneration, lumbar region: Secondary | ICD-10-CM | POA: Diagnosis not present

## 2022-03-21 DIAGNOSIS — Z853 Personal history of malignant neoplasm of breast: Secondary | ICD-10-CM | POA: Diagnosis not present

## 2022-03-21 DIAGNOSIS — Z9011 Acquired absence of right breast and nipple: Secondary | ICD-10-CM | POA: Diagnosis not present

## 2022-03-21 DIAGNOSIS — Z7984 Long term (current) use of oral hypoglycemic drugs: Secondary | ICD-10-CM | POA: Diagnosis not present

## 2022-03-21 DIAGNOSIS — G20 Parkinson's disease: Secondary | ICD-10-CM | POA: Diagnosis not present

## 2022-03-21 DIAGNOSIS — Z9049 Acquired absence of other specified parts of digestive tract: Secondary | ICD-10-CM | POA: Diagnosis not present

## 2022-03-21 DIAGNOSIS — E785 Hyperlipidemia, unspecified: Secondary | ICD-10-CM | POA: Diagnosis not present

## 2022-03-21 DIAGNOSIS — K219 Gastro-esophageal reflux disease without esophagitis: Secondary | ICD-10-CM | POA: Diagnosis not present

## 2022-03-21 DIAGNOSIS — I509 Heart failure, unspecified: Secondary | ICD-10-CM | POA: Diagnosis not present

## 2022-03-21 DIAGNOSIS — M109 Gout, unspecified: Secondary | ICD-10-CM | POA: Diagnosis not present

## 2022-03-21 DIAGNOSIS — N6019 Diffuse cystic mastopathy of unspecified breast: Secondary | ICD-10-CM | POA: Diagnosis not present

## 2022-03-21 DIAGNOSIS — E669 Obesity, unspecified: Secondary | ICD-10-CM | POA: Diagnosis not present

## 2022-03-21 DIAGNOSIS — G4733 Obstructive sleep apnea (adult) (pediatric): Secondary | ICD-10-CM | POA: Diagnosis not present

## 2022-03-21 DIAGNOSIS — Z96653 Presence of artificial knee joint, bilateral: Secondary | ICD-10-CM | POA: Diagnosis not present

## 2022-03-21 DIAGNOSIS — M5030 Other cervical disc degeneration, unspecified cervical region: Secondary | ICD-10-CM | POA: Diagnosis not present

## 2022-03-21 DIAGNOSIS — I110 Hypertensive heart disease with heart failure: Secondary | ICD-10-CM | POA: Diagnosis not present

## 2022-03-21 DIAGNOSIS — R69 Illness, unspecified: Secondary | ICD-10-CM | POA: Diagnosis not present

## 2022-03-21 DIAGNOSIS — G5602 Carpal tunnel syndrome, left upper limb: Secondary | ICD-10-CM | POA: Diagnosis not present

## 2022-03-21 DIAGNOSIS — K5900 Constipation, unspecified: Secondary | ICD-10-CM | POA: Diagnosis not present

## 2022-03-21 DIAGNOSIS — G2581 Restless legs syndrome: Secondary | ICD-10-CM | POA: Diagnosis not present

## 2022-03-21 DIAGNOSIS — Z90711 Acquired absence of uterus with remaining cervical stump: Secondary | ICD-10-CM | POA: Diagnosis not present

## 2022-03-21 DIAGNOSIS — Z9181 History of falling: Secondary | ICD-10-CM | POA: Diagnosis not present

## 2022-03-21 DIAGNOSIS — I4891 Unspecified atrial fibrillation: Secondary | ICD-10-CM | POA: Diagnosis not present

## 2022-03-21 NOTE — Telephone Encounter (Signed)
[place] caregiver Holley Raring called in stating she hasn't heard anything from [facility] yet. She wanted to talk to Anda Kraft in regards to the ulcer and getting it dressed up until the nurse shows up. Please advise. Thank you!

## 2022-03-21 NOTE — Progress Notes (Signed)
Chronic Care Management Pharmacy Assistant   Name: Selena Bush  MRN: [id number] DOB: 04-Oct-1945  Reason for Encounter: CCM (Medication Adherence and Delivery Coordination)  Recent office visits:  03/16/22 [name], NP Skin ulcer Referral: Home Health Start: nystatin cream (MYCOSTATIN)  Recent consult visits:  02/22/22 Hervey Ard, MD (Gen Surgery) HX of Breast Cancer Patient is released from routine general surgical follow-up.   Hospital visits:  None in previous 6 months  Medications: Outpatient Encounter Medications as of 03/21/2022  Medication Sig   acetaminophen (TYLENOL) 500 MG tablet Take by mouth.   carbidopa-levodopa (SINEMET IR) 25-100 MG tablet Take 2 tablets at 6 AM, 1 at 9AM, 1 tab at 12 PM,  2 at 3 PM, 1 at 6 PM and 2 at 9 PM.   Carbidopa-Levodopa ER (SINEMET CR) 25-100 MG tablet controlled release Take 1 tab at 9pm   Cholecalciferol (VITAMIN D-3) 25 MCG (1000 UT) CAPS Take 1 capsule by mouth daily.   Cyanocobalamin (VITAMIN B-12) 5000 MCG SUBL Place 1 each under the tongue daily.   DULoxetine (CYMBALTA) 60 MG capsule TAKE ONE CAPSULE BY MOUTH TWICE DAILY FOR anxiety AND depression   ezetimibe (ZETIA) 10 MG tablet TAKE ONE TABLET BY MOUTH EVERY MORNING   fluticasone (FLONASE) 50 MCG/ACT nasal spray Place 2 sprays into both nostrils daily as needed for rhinitis or allergies.   furosemide (LASIX) 20 MG tablet Take 1 tablet (20 mg total) by mouth every morning.   furosemide (LASIX) 20 MG tablet Take 1 tablet (20 mg total) by mouth as needed. If needed above your regular daily dose of furosemide   furosemide (LASIX) 20 MG tablet TAKE ONE TABLET BY MOUTH ONCE DAILY May take additional tablet daily if needed   gabapentin (NEURONTIN) 600 MG tablet TAKE ONE TABLET BY MOUTH EVERYDAY AT BEDTIME   hydrocortisone 2.5 % cream Apply to affected under breasts 1-2 times a day as directed and as needed for itch. Can also use on face as needed for itch   JARDIANCE 10 MG  TABS tablet TAKE ONE TABLET BY MOUTH BEFORE BREAKFAST   ketoconazole (NIZORAL) 2 % cream Apply to affected areas under breast twice daily for rash until healed. Can also use on face as needed for dermatitis   metoprolol tartrate (LOPRESSOR) 25 MG tablet TAKE THREE TABLETS BY MOUTH TWICE DAILY   nystatin cream (MYCOSTATIN) Apply 1 Application topically 2 (two) times daily.   oxybutynin (DITROPAN) 5 MG tablet Take 5 mg by mouth 2 (two) times daily.   sotalol (BETAPACE) 120 MG tablet TAKE ONE TABLET BY MOUTH TWICE DAILY   spironolactone (ALDACTONE) 25 MG tablet Take 1 tablet (25 mg total) by mouth daily.   tiZANidine (ZANAFLEX) 4 MG tablet TAKE 2 TABLET (4 MG TOTAL) BY MOUTH DAILY FOR MUSCLE SPASMS AT BEDTIME.   No facility-administered encounter medications on file as of 03/21/2022.   BP Readings from Last 3 Encounters:  03/16/22 110/68  02/08/22 106/65  01/27/22 100/62    Lab Results  Component Value Date   HGBA1C 5.8 (A) 06/22/2021    Recent OV, Consult or Hospital visit:  Recent medication changes indicated:   Start: nystatin cream (MYCOSTATIN)  Last adherence delivery date: 03/02/22      Patient is due for next adherence delivery on: 03/31/22  Spoke with patient on 03/21/2022 reviewed medications and coordinated delivery.  TThis delivery to include: Adherence Packaging  30 Days  Packs: Sotalol '120mg'$  1 tablet twice daily (1 breakfast and 1  evening meal) Tizanidine '4mg'$  2 tablets daily (2 bedtime) Ezetimibe '10mg'$  1 tablet daily (breakfast) Gabapentin 600 mg - 1 tablet daily (bedtime) Spironolactone '25mg'$  tablet 1 tablet (breakfast) Jardiance 10 mg 1 tablet daily (before breakfast) Duloxetine 60 mg 1 tablet twice a day (1 breakfast, 1 evening meal) Oxybutynin 5 mg 2 tablets daily (1 tablet breakfast 1 tablet evening meal) Metoprolol '25mg'$  3 tablets twice a day (3 breakfast and 3 evening meal) Furosemide 20 mg - 1 tablet daily (breakfast) Vitamin D - 1000 units - 1 tablet daily  (breakfast)  VIAL medications: w/o safety caps Carbidopa-Levodopa 25-'100mg'$ Take 2 tablets at 6 AM, 1 at 9AM, 1 tab at 12 PM,  2 at 3 PM, 1 at 6 PM and 2 at 9 PM  May take extra tab as need.  Furosemide 20 mg - 1 tablet PRN above regular daily dose of Furosemide  Carbidopa-Levodopa ER 25-'100mg'$  - 1 tablet at 9:00 pm  Patient declined the following medications this month: Gabapentin '100mg'$  1 capsule three times a day (1 breakfast, 1 evening meal, 1 bedtime) May take 1 extra daily, PRN in vial. Patient has three bottles on hand.  Fluticasone Propionate 50 mcg Place 2 sprays into both nostrils daily. Patient has four bottles on hand.   Any concerns about your medications? No  How often do you forget or accidentally miss a dose? Never  Do you use a pillbox? No  Is patient in packaging Yes  If yes  What is the date on your next pill pack? 03/21/2022  Any concerns or issues with your packaging? No  Refills requested from providers include: Completed Gabapentin 600 mg - 1 tablet daily (bedtime)  Confirmed delivery date of 03/31/2022, advised patient that pharmacy will contact them the morning of delivery.   Recent blood pressure readings are as follows: Patient does not check at home.   Annual wellness visit in last year? Yes 01/06/22 Most Recent BP reading: 110/68 on 03/16/22  Cycle dispensing form sent to [facility] for review.

## 2022-03-21 NOTE — Telephone Encounter (Signed)
Called and spoke to [place] (caregiver) and could not find a DPR with her name on it. Patient got on the phone and gave me permission to talk to [facility]. AVS instructions from office visit was read to [facility]. Holley Raring was advised that I will reach out to our referral coordinator and see what the status is of the referral. Holley Raring stated that they will make sure that she is put on the DPR the next time that they are in the office. Will also send the message to Romilda Garret NP since Allie Bossier NP is not in the office today for any other advice.

## 2022-03-21 NOTE — Telephone Encounter (Signed)
Noted. Pend what referral coordinator states

## 2022-03-23 DIAGNOSIS — R69 Illness, unspecified: Secondary | ICD-10-CM | POA: Diagnosis not present

## 2022-03-23 DIAGNOSIS — F4312 Post-traumatic stress disorder, chronic: Secondary | ICD-10-CM | POA: Diagnosis not present

## 2022-03-23 DIAGNOSIS — F330 Major depressive disorder, recurrent, mild: Secondary | ICD-10-CM | POA: Diagnosis not present

## 2022-03-24 DIAGNOSIS — E669 Obesity, unspecified: Secondary | ICD-10-CM | POA: Diagnosis not present

## 2022-03-24 DIAGNOSIS — G5602 Carpal tunnel syndrome, left upper limb: Secondary | ICD-10-CM | POA: Diagnosis not present

## 2022-03-24 DIAGNOSIS — K219 Gastro-esophageal reflux disease without esophagitis: Secondary | ICD-10-CM | POA: Diagnosis not present

## 2022-03-24 DIAGNOSIS — N6019 Diffuse cystic mastopathy of unspecified breast: Secondary | ICD-10-CM | POA: Diagnosis not present

## 2022-03-24 DIAGNOSIS — M5030 Other cervical disc degeneration, unspecified cervical region: Secondary | ICD-10-CM | POA: Diagnosis not present

## 2022-03-24 DIAGNOSIS — G4733 Obstructive sleep apnea (adult) (pediatric): Secondary | ICD-10-CM | POA: Diagnosis not present

## 2022-03-24 DIAGNOSIS — K5900 Constipation, unspecified: Secondary | ICD-10-CM | POA: Diagnosis not present

## 2022-03-24 DIAGNOSIS — Z7984 Long term (current) use of oral hypoglycemic drugs: Secondary | ICD-10-CM | POA: Diagnosis not present

## 2022-03-24 DIAGNOSIS — E785 Hyperlipidemia, unspecified: Secondary | ICD-10-CM | POA: Diagnosis not present

## 2022-03-24 DIAGNOSIS — G20 Parkinson's disease: Secondary | ICD-10-CM | POA: Diagnosis not present

## 2022-03-24 DIAGNOSIS — G2581 Restless legs syndrome: Secondary | ICD-10-CM | POA: Diagnosis not present

## 2022-03-24 DIAGNOSIS — Z90711 Acquired absence of uterus with remaining cervical stump: Secondary | ICD-10-CM | POA: Diagnosis not present

## 2022-03-24 DIAGNOSIS — Z9181 History of falling: Secondary | ICD-10-CM | POA: Diagnosis not present

## 2022-03-24 DIAGNOSIS — Z853 Personal history of malignant neoplasm of breast: Secondary | ICD-10-CM | POA: Diagnosis not present

## 2022-03-24 DIAGNOSIS — M109 Gout, unspecified: Secondary | ICD-10-CM | POA: Diagnosis not present

## 2022-03-24 DIAGNOSIS — M5136 Other intervertebral disc degeneration, lumbar region: Secondary | ICD-10-CM | POA: Diagnosis not present

## 2022-03-24 DIAGNOSIS — I4891 Unspecified atrial fibrillation: Secondary | ICD-10-CM | POA: Diagnosis not present

## 2022-03-24 DIAGNOSIS — Z9011 Acquired absence of right breast and nipple: Secondary | ICD-10-CM | POA: Diagnosis not present

## 2022-03-24 DIAGNOSIS — Z96653 Presence of artificial knee joint, bilateral: Secondary | ICD-10-CM | POA: Diagnosis not present

## 2022-03-24 DIAGNOSIS — I509 Heart failure, unspecified: Secondary | ICD-10-CM | POA: Diagnosis not present

## 2022-03-24 DIAGNOSIS — R69 Illness, unspecified: Secondary | ICD-10-CM | POA: Diagnosis not present

## 2022-03-24 DIAGNOSIS — Z9049 Acquired absence of other specified parts of digestive tract: Secondary | ICD-10-CM | POA: Diagnosis not present

## 2022-03-24 DIAGNOSIS — I110 Hypertensive heart disease with heart failure: Secondary | ICD-10-CM | POA: Diagnosis not present

## 2022-03-25 ENCOUNTER — Telehealth: Payer: Self-pay | Admitting: Primary Care

## 2022-03-25 NOTE — Telephone Encounter (Signed)
Ashtyn or Erica,   Can you change this from your end?  Allie Bossier, NP-C

## 2022-03-25 NOTE — Telephone Encounter (Signed)
Calling from [facility] stating they are current w/ patient. Requesting referral be changed to them instead of [place] [phone]

## 2022-03-28 NOTE — Telephone Encounter (Signed)
I have sent a message to Judson Roch making her aware they want to use [facility], I have also sent a message to Marjory Lies asking if he can get this order scheduled.  Danae Chen

## 2022-03-29 ENCOUNTER — Other Ambulatory Visit: Payer: Self-pay | Admitting: *Deleted

## 2022-03-29 ENCOUNTER — Ambulatory Visit: Payer: Medicare HMO | Admitting: Family

## 2022-03-29 DIAGNOSIS — M5030 Other cervical disc degeneration, unspecified cervical region: Secondary | ICD-10-CM | POA: Diagnosis not present

## 2022-03-29 DIAGNOSIS — Z853 Personal history of malignant neoplasm of breast: Secondary | ICD-10-CM | POA: Diagnosis not present

## 2022-03-29 DIAGNOSIS — M5136 Other intervertebral disc degeneration, lumbar region: Secondary | ICD-10-CM | POA: Diagnosis not present

## 2022-03-29 DIAGNOSIS — I509 Heart failure, unspecified: Secondary | ICD-10-CM | POA: Diagnosis not present

## 2022-03-29 DIAGNOSIS — E669 Obesity, unspecified: Secondary | ICD-10-CM | POA: Diagnosis not present

## 2022-03-29 DIAGNOSIS — R69 Illness, unspecified: Secondary | ICD-10-CM | POA: Diagnosis not present

## 2022-03-29 DIAGNOSIS — I4891 Unspecified atrial fibrillation: Secondary | ICD-10-CM | POA: Diagnosis not present

## 2022-03-29 DIAGNOSIS — G2581 Restless legs syndrome: Secondary | ICD-10-CM | POA: Diagnosis not present

## 2022-03-29 DIAGNOSIS — Z9049 Acquired absence of other specified parts of digestive tract: Secondary | ICD-10-CM | POA: Diagnosis not present

## 2022-03-29 DIAGNOSIS — K5900 Constipation, unspecified: Secondary | ICD-10-CM | POA: Diagnosis not present

## 2022-03-29 DIAGNOSIS — E785 Hyperlipidemia, unspecified: Secondary | ICD-10-CM | POA: Diagnosis not present

## 2022-03-29 DIAGNOSIS — Z9181 History of falling: Secondary | ICD-10-CM | POA: Diagnosis not present

## 2022-03-29 DIAGNOSIS — K219 Gastro-esophageal reflux disease without esophagitis: Secondary | ICD-10-CM | POA: Diagnosis not present

## 2022-03-29 DIAGNOSIS — Z9011 Acquired absence of right breast and nipple: Secondary | ICD-10-CM | POA: Diagnosis not present

## 2022-03-29 DIAGNOSIS — G5602 Carpal tunnel syndrome, left upper limb: Secondary | ICD-10-CM | POA: Diagnosis not present

## 2022-03-29 DIAGNOSIS — I110 Hypertensive heart disease with heart failure: Secondary | ICD-10-CM | POA: Diagnosis not present

## 2022-03-29 DIAGNOSIS — M109 Gout, unspecified: Secondary | ICD-10-CM | POA: Diagnosis not present

## 2022-03-29 DIAGNOSIS — G4733 Obstructive sleep apnea (adult) (pediatric): Secondary | ICD-10-CM | POA: Diagnosis not present

## 2022-03-29 DIAGNOSIS — G20 Parkinson's disease: Secondary | ICD-10-CM | POA: Diagnosis not present

## 2022-03-29 DIAGNOSIS — N6019 Diffuse cystic mastopathy of unspecified breast: Secondary | ICD-10-CM | POA: Diagnosis not present

## 2022-03-29 DIAGNOSIS — Z96653 Presence of artificial knee joint, bilateral: Secondary | ICD-10-CM | POA: Diagnosis not present

## 2022-03-29 DIAGNOSIS — Z90711 Acquired absence of uterus with remaining cervical stump: Secondary | ICD-10-CM | POA: Diagnosis not present

## 2022-03-29 DIAGNOSIS — Z7984 Long term (current) use of oral hypoglycemic drugs: Secondary | ICD-10-CM | POA: Diagnosis not present

## 2022-03-29 NOTE — Patient Outreach (Signed)
  Care Coordination   Initial Visit Note   03/29/2022 Name: Selena Bush MRN: [id number] DOB: August 23, 1945  Selena Bush is a 76 y.o. year old female who sees Pleas Koch, NP for primary care. I spoke with  Caregiver by phone today  RN discussed services [facility] services, RN, SW, and Pharmacist. Patient declined services.    Goals Addressed   None     SDOH assessments and interventions completed:  No     Care Coordination Interventions Activated:y  Care Coordination Interventions:  No, not indicated   Follow up plan: No further intervention required.   Encounter Outcome:  Pt. Visit Completed

## 2022-03-30 ENCOUNTER — Telehealth: Payer: Self-pay

## 2022-03-30 DIAGNOSIS — N6019 Diffuse cystic mastopathy of unspecified breast: Secondary | ICD-10-CM | POA: Diagnosis not present

## 2022-03-30 DIAGNOSIS — G20 Parkinson's disease: Secondary | ICD-10-CM | POA: Diagnosis not present

## 2022-03-30 DIAGNOSIS — I509 Heart failure, unspecified: Secondary | ICD-10-CM | POA: Diagnosis not present

## 2022-03-30 DIAGNOSIS — M5030 Other cervical disc degeneration, unspecified cervical region: Secondary | ICD-10-CM | POA: Diagnosis not present

## 2022-03-30 DIAGNOSIS — Z90711 Acquired absence of uterus with remaining cervical stump: Secondary | ICD-10-CM | POA: Diagnosis not present

## 2022-03-30 DIAGNOSIS — Z853 Personal history of malignant neoplasm of breast: Secondary | ICD-10-CM | POA: Diagnosis not present

## 2022-03-30 DIAGNOSIS — G5602 Carpal tunnel syndrome, left upper limb: Secondary | ICD-10-CM | POA: Diagnosis not present

## 2022-03-30 DIAGNOSIS — I110 Hypertensive heart disease with heart failure: Secondary | ICD-10-CM | POA: Diagnosis not present

## 2022-03-30 DIAGNOSIS — M109 Gout, unspecified: Secondary | ICD-10-CM | POA: Diagnosis not present

## 2022-03-30 DIAGNOSIS — I4891 Unspecified atrial fibrillation: Secondary | ICD-10-CM | POA: Diagnosis not present

## 2022-03-30 DIAGNOSIS — E669 Obesity, unspecified: Secondary | ICD-10-CM | POA: Diagnosis not present

## 2022-03-30 DIAGNOSIS — G4733 Obstructive sleep apnea (adult) (pediatric): Secondary | ICD-10-CM | POA: Diagnosis not present

## 2022-03-30 DIAGNOSIS — K5900 Constipation, unspecified: Secondary | ICD-10-CM | POA: Diagnosis not present

## 2022-03-30 DIAGNOSIS — R69 Illness, unspecified: Secondary | ICD-10-CM | POA: Diagnosis not present

## 2022-03-30 DIAGNOSIS — K219 Gastro-esophageal reflux disease without esophagitis: Secondary | ICD-10-CM | POA: Diagnosis not present

## 2022-03-30 DIAGNOSIS — Z9049 Acquired absence of other specified parts of digestive tract: Secondary | ICD-10-CM | POA: Diagnosis not present

## 2022-03-30 DIAGNOSIS — Z9011 Acquired absence of right breast and nipple: Secondary | ICD-10-CM | POA: Diagnosis not present

## 2022-03-30 DIAGNOSIS — Z9181 History of falling: Secondary | ICD-10-CM | POA: Diagnosis not present

## 2022-03-30 DIAGNOSIS — G2581 Restless legs syndrome: Secondary | ICD-10-CM | POA: Diagnosis not present

## 2022-03-30 DIAGNOSIS — M5136 Other intervertebral disc degeneration, lumbar region: Secondary | ICD-10-CM | POA: Diagnosis not present

## 2022-03-30 DIAGNOSIS — Z7984 Long term (current) use of oral hypoglycemic drugs: Secondary | ICD-10-CM | POA: Diagnosis not present

## 2022-03-30 DIAGNOSIS — E785 Hyperlipidemia, unspecified: Secondary | ICD-10-CM | POA: Diagnosis not present

## 2022-03-30 DIAGNOSIS — Z96653 Presence of artificial knee joint, bilateral: Secondary | ICD-10-CM | POA: Diagnosis not present

## 2022-03-30 NOTE — Progress Notes (Signed)
    Chronic Care Management Pharmacy Assistant   Name: SAUSHA RAYMOND  MRN: [id number] DOB: 1946/02/04  Reason for Encounter: CCM (Appointment Reminder)  Medications: Outpatient Encounter Medications as of 03/30/2022  Medication Sig   acetaminophen (TYLENOL) 500 MG tablet Take by mouth.   carbidopa-levodopa (SINEMET IR) 25-100 MG tablet Take 2 tablets at 6 AM, 1 at 9AM, 1 tab at 12 PM,  2 at 3 PM, 1 at 6 PM and 2 at 9 PM.   Carbidopa-Levodopa ER (SINEMET CR) 25-100 MG tablet controlled release Take 1 tab at 9pm   Cholecalciferol (VITAMIN D-3) 25 MCG (1000 UT) CAPS Take 1 capsule by mouth daily.   Cyanocobalamin (VITAMIN B-12) 5000 MCG SUBL Place 1 each under the tongue daily.   DULoxetine (CYMBALTA) 60 MG capsule TAKE ONE CAPSULE BY MOUTH TWICE DAILY FOR anxiety AND depression   ezetimibe (ZETIA) 10 MG tablet TAKE ONE TABLET BY MOUTH EVERY MORNING   fluticasone (FLONASE) 50 MCG/ACT nasal spray Place 2 sprays into both nostrils daily as needed for rhinitis or allergies.   furosemide (LASIX) 20 MG tablet Take 1 tablet (20 mg total) by mouth every morning.   furosemide (LASIX) 20 MG tablet Take 1 tablet (20 mg total) by mouth as needed. If needed above your regular daily dose of furosemide   furosemide (LASIX) 20 MG tablet TAKE ONE TABLET BY MOUTH ONCE DAILY May take additional tablet daily if needed   gabapentin (NEURONTIN) 600 MG tablet TAKE ONE TABLET BY MOUTH EVERYDAY AT BEDTIME   hydrocortisone 2.5 % cream Apply to affected under breasts 1-2 times a day as directed and as needed for itch. Can also use on face as needed for itch   JARDIANCE 10 MG TABS tablet TAKE ONE TABLET BY MOUTH BEFORE BREAKFAST   ketoconazole (NIZORAL) 2 % cream Apply to affected areas under breast twice daily for rash until healed. Can also use on face as needed for dermatitis   metoprolol tartrate (LOPRESSOR) 25 MG tablet TAKE THREE TABLETS BY MOUTH TWICE DAILY   nystatin cream (MYCOSTATIN) Apply 1 Application  topically 2 (two) times daily.   oxybutynin (DITROPAN) 5 MG tablet Take 5 mg by mouth 2 (two) times daily.   sotalol (BETAPACE) 120 MG tablet TAKE ONE TABLET BY MOUTH TWICE DAILY   spironolactone (ALDACTONE) 25 MG tablet Take 1 tablet (25 mg total) by mouth daily.   tiZANidine (ZANAFLEX) 4 MG tablet TAKE 2 TABLET (4 MG TOTAL) BY MOUTH DAILY FOR MUSCLE SPASMS AT BEDTIME.   No facility-administered encounter medications on file as of 03/30/2022.   [name] was contacted to remind of upcoming telephone visit with Charlene Brooke on 04/04/22 at 3:00. Patient was reminded to have any blood glucose and blood pressure readings available for review at appointment.   Patient confirmed appointment.  Are you having any problems with your medications? No   Do you have any concerns you like to discuss with the pharmacist? No  CCM referral has been placed prior to visit?  Yes   Star Rating Drugs: Medication:  Last Fill: Day Supply Jardiance 10 mg 03/28/22 [place], CPP notified  [name], [place] Clinical Pharmacy Assistant [phone]

## 2022-04-01 DIAGNOSIS — I4891 Unspecified atrial fibrillation: Secondary | ICD-10-CM | POA: Diagnosis not present

## 2022-04-01 DIAGNOSIS — Z853 Personal history of malignant neoplasm of breast: Secondary | ICD-10-CM | POA: Diagnosis not present

## 2022-04-01 DIAGNOSIS — K219 Gastro-esophageal reflux disease without esophagitis: Secondary | ICD-10-CM | POA: Diagnosis not present

## 2022-04-01 DIAGNOSIS — Z9011 Acquired absence of right breast and nipple: Secondary | ICD-10-CM | POA: Diagnosis not present

## 2022-04-01 DIAGNOSIS — R69 Illness, unspecified: Secondary | ICD-10-CM | POA: Diagnosis not present

## 2022-04-01 DIAGNOSIS — Z9049 Acquired absence of other specified parts of digestive tract: Secondary | ICD-10-CM | POA: Diagnosis not present

## 2022-04-01 DIAGNOSIS — Z90711 Acquired absence of uterus with remaining cervical stump: Secondary | ICD-10-CM | POA: Diagnosis not present

## 2022-04-01 DIAGNOSIS — Z7984 Long term (current) use of oral hypoglycemic drugs: Secondary | ICD-10-CM | POA: Diagnosis not present

## 2022-04-01 DIAGNOSIS — E785 Hyperlipidemia, unspecified: Secondary | ICD-10-CM | POA: Diagnosis not present

## 2022-04-01 DIAGNOSIS — N6019 Diffuse cystic mastopathy of unspecified breast: Secondary | ICD-10-CM | POA: Diagnosis not present

## 2022-04-01 DIAGNOSIS — M109 Gout, unspecified: Secondary | ICD-10-CM | POA: Diagnosis not present

## 2022-04-01 DIAGNOSIS — I110 Hypertensive heart disease with heart failure: Secondary | ICD-10-CM | POA: Diagnosis not present

## 2022-04-01 DIAGNOSIS — E669 Obesity, unspecified: Secondary | ICD-10-CM | POA: Diagnosis not present

## 2022-04-01 DIAGNOSIS — G5602 Carpal tunnel syndrome, left upper limb: Secondary | ICD-10-CM | POA: Diagnosis not present

## 2022-04-01 DIAGNOSIS — K5900 Constipation, unspecified: Secondary | ICD-10-CM | POA: Diagnosis not present

## 2022-04-01 DIAGNOSIS — I509 Heart failure, unspecified: Secondary | ICD-10-CM | POA: Diagnosis not present

## 2022-04-01 DIAGNOSIS — M5030 Other cervical disc degeneration, unspecified cervical region: Secondary | ICD-10-CM | POA: Diagnosis not present

## 2022-04-01 DIAGNOSIS — Z9181 History of falling: Secondary | ICD-10-CM | POA: Diagnosis not present

## 2022-04-01 DIAGNOSIS — M5136 Other intervertebral disc degeneration, lumbar region: Secondary | ICD-10-CM | POA: Diagnosis not present

## 2022-04-01 DIAGNOSIS — G4733 Obstructive sleep apnea (adult) (pediatric): Secondary | ICD-10-CM | POA: Diagnosis not present

## 2022-04-01 DIAGNOSIS — G20 Parkinson's disease: Secondary | ICD-10-CM | POA: Diagnosis not present

## 2022-04-01 DIAGNOSIS — Z96653 Presence of artificial knee joint, bilateral: Secondary | ICD-10-CM | POA: Diagnosis not present

## 2022-04-01 DIAGNOSIS — G2581 Restless legs syndrome: Secondary | ICD-10-CM | POA: Diagnosis not present

## 2022-04-04 ENCOUNTER — Ambulatory Visit (INDEPENDENT_AMBULATORY_CARE_PROVIDER_SITE_OTHER): Payer: Medicare HMO | Admitting: Pharmacist

## 2022-04-04 DIAGNOSIS — G20 Parkinson's disease: Secondary | ICD-10-CM

## 2022-04-04 DIAGNOSIS — E7800 Pure hypercholesterolemia, unspecified: Secondary | ICD-10-CM

## 2022-04-04 DIAGNOSIS — F3341 Major depressive disorder, recurrent, in partial remission: Secondary | ICD-10-CM

## 2022-04-04 DIAGNOSIS — I5032 Chronic diastolic (congestive) heart failure: Secondary | ICD-10-CM

## 2022-04-04 DIAGNOSIS — I480 Paroxysmal atrial fibrillation: Secondary | ICD-10-CM

## 2022-04-04 DIAGNOSIS — I10 Essential (primary) hypertension: Secondary | ICD-10-CM

## 2022-04-04 NOTE — Progress Notes (Signed)
Chronic Care Management Pharmacy Note  04/08/2022 Name:  Selena Bush MRN:  [id number] DOB:  02-25-1946  Summary: CCM F/U visit -Reviewed medications; pt is compliant with pill packs and denies issues at this time  Recommendations/Changes made from today's visit: -No med changes  Plan: -[place] will call patient monthly for medication coordination -Pharmacist follow up televisit scheduled for 6 months -PCP CPE due 06/2022    Subjective: Selena Bush is an 76 y.o. year old female who is a primary patient of Pleas Koch, NP.  The CCM team was consulted for assistance with disease management and care coordination needs.    Engaged with patient by telephone for follow up visit in response to provider referral for pharmacy case management and/or care coordination services.   Consent to Services:  The patient was given information about Chronic Care Management services, agreed to services, and gave verbal consent prior to initiation of services.  Please see initial visit note for detailed documentation.   Patient Care Team: Pleas Koch, NP as PCP - General (Internal Medicine) Stanford Breed Denice Bors, MD as PCP - Cardiology (Cardiology) Bary Castilla Forest Gleason, MD (General Surgery) Corey Harold, MD as Consulting Physician Charlton Haws, [facility] as Pharmacist (Pharmacist)  Recent office visits: 03/16/22 NP Allie Bossier OV: skin ulcer - rx'd nystatin. Referred home health.  11/24/21 [name], NP: Hallucinations, UTI - Urine cx negative. F/u with neurology regarding hallucinations.  Recent consult visits: 02/08/22 Dr Stanford Breed (Cardiology): d/c diltiazem. No a/c d/t frequent falls. 01/13/22 Dr Melrose Nakayama (Neurology): f/u - no changes. Consider mirtazapine in future. 12/21/21 [name], MD (Cardiology): Per telephone conversation patient is not taking Abilify.  12/15/21 Gurney Maxin, MD (Neurology): Parkinson's disease. Start: Seroquel 25 mg for one week;  then increase to 50 mg nightly. Stop: Melatonin. Start: Vit D [phone] units daily.   11/25/21 Darylene Price, NP (Cardiology): Change: SINEMET CR 25-100 MG tablet controlled release - 1 tab at bedtime. Change: Gabapentin 100 mg caps three times daily.  11/22/21 Gurney Maxin, MD (Neurology): Parkinson's Disease No med changes  Hospital visits: None in previous 6 months   Objective:  Lab Results  Component Value Date   CREATININE 1.20 11/24/2021   BUN 23 11/24/2021   GFR 44.90 (L) 11/24/2021   EGFR 49 (L) 05/21/2021   GFRNONAA 45 (L) 07/13/2021   GFRAA 54 (L) 07/15/2019   NA 132 (L) 11/24/2021   K 4.3 11/24/2021   CALCIUM 9.6 11/24/2021   CO2 32 11/24/2021   GLUCOSE 89 11/24/2021    Lab Results  Component Value Date/Time   HGBA1C 5.8 (A) 06/22/2021 03:29 PM   HGBA1C 5.9 06/01/2020 11:11 AM   HGBA1C 5.6 10/30/2019 03:37 PM   GFR 44.90 (L) 11/24/2021 09:35 AM   GFR 56.91 (L) 06/01/2020 11:11 AM    Last diabetic Eye exam:  Lab Results  Component Value Date/Time   HMDIABEYEEXA No Retinopathy 07/27/2021 12:00 AM    Last diabetic Foot exam: No results found for: "HMDIABFOOTEX"   Lab Results  Component Value Date   CHOL 165 12/31/2020   HDL 62 12/31/2020   LDLCALC 84 12/31/2020   TRIG 109 12/31/2020   CHOLHDL 2.7 12/31/2020       Latest Ref Rng & Units 05/21/2021    3:05 PM 12/31/2020    9:33 AM 10/30/2019    3:37 PM  Hepatic Function  Total Protein 6.0 - 8.5 g/dL 6.3  6.3  6.8   Albumin 3.7 - 4.7  g/dL 4.2  3.9  4.0   AST 0 - 40 IU/L _0 ALT 0 - 32 IU/L _1 Alk Phosphatase 44 - 121 IU/L 107  107  92   Total Bilirubin 0.0 - 1.2 mg/dL 0.3  0.5  0.4   Bilirubin, Direct 0.00 - 0.40 mg/dL  0.14      Lab Results  Component Value Date/Time   TSH 1.780 05/21/2021 03:05 PM   TSH 3.230 01/05/2018 05:46 PM       Latest Ref Rng & Units 11/24/2021    9:35 AM 06/05/2021    5:18 PM 10/30/2019    3:37 PM  CBC  WBC 4.0 - 10.5 K/uL 9.7  6.6  6.5    Hemoglobin 12.0 - 15.0 g/dL 13.4  13.1  14.2   Hematocrit 36.0 - 46.0 % 41.7  39.2  42.5   Platelets 150.0 - 400.0 K/uL 270.0  187  248.0     Lab Results  Component Value Date/Time   VD25OH 35.51 01/27/2022 09:32 AM    Clinical ASCVD: No  The 10-year ASCVD risk score (Arnett DK, et al., 2019) is: 22.7%   Values used to calculate the score:     Age: 69 years     Sex: Female     Is Non-Hispanic African American: No     Diabetic: Yes     Tobacco smoker: No     Systolic Blood Pressure: 619 mmHg     Is BP treated: Yes     HDL Cholesterol: 62 mg/dL     Total Cholesterol: 165 mg/dL       01/06/2022   11:41 AM 11/12/2021   11:48 AM 11/12/2021   11:47 AM  Depression screen PHQ 2/9  Decreased Interest 0 0 0  Down, Depressed, Hopeless 0 1 0  PHQ - 2 Score 0 1 0  Altered sleeping  0   Tired, decreased energy  0   Change in appetite  0   Feeling bad or failure about yourself   1   Trouble concentrating  0   Moving slowly or fidgety/restless  1   Suicidal thoughts  0   PHQ-9 Score  3      CHA2DS2/VAS Stroke Risk Points  Current as of yesterday     5 >= 2 Points: High Risk  1 - 1.99 Points: Medium Risk  0 Points: Low Risk    No Change     Points Metrics  1 Has Congestive Heart Failure:  Yes    Current as of yesterday  0 Has Vascular Disease:  No    Current as of yesterday  1 Has Hypertension:  Yes    Current as of yesterday  1 Age:  89    Current as of yesterday  1 Has Diabetes:  Yes     Current as of yesterday  0 Had Stroke:  No  Had TIA:  No  Had Thromboembolism:  No    Current as of yesterday  1 Female:  Yes    Current as of yesterday   Social History   Tobacco Use  Smoking Status Never  Smokeless Tobacco Never  Tobacco Comments   social smoker as a teen   BP Readings from Last 3 Encounters:  03/16/22 110/68  02/08/22 106/65  01/27/22 100/62   Pulse Readings from Last 3 Encounters:  03/16/22 60  02/08/22 70  01/27/22 65   Wt Readings from  Last 3  Encounters:  03/16/22 145 lb (65.8 kg)  02/08/22 145 lb 12.8 oz (66.1 kg)  01/27/22 149 lb (67.6 kg)   BMI Readings from Last 3 Encounters:  03/16/22 26.52 kg/m  02/08/22 26.67 kg/m  01/27/22 27.25 kg/m    Assessment/Interventions: Review of patient past medical history, allergies, medications, health status, including review of consultants reports, laboratory and other test data, was performed as part of comprehensive evaluation and provision of chronic care management services.   SDOH:  (Social Determinants of Health) assessments and interventions performed: No - done 12/2021  SDOH Screenings   Alcohol Screen: Not on file  Depression (PHQ2-9): Low Risk  (01/06/2022)   Depression (PHQ2-9)    PHQ-2 Score: 0  Financial Resource Strain: Low Risk  (01/06/2022)   Overall Financial Resource Strain (CARDIA)    Difficulty of Paying Living Expenses: Not hard at all  Food Insecurity: No Food Insecurity (01/06/2022)   Hunger Vital Sign    Worried About Running Out of Food in the Last Year: Never true    Ran Out of Food in the Last Year: Never true  Housing: Not on file  Physical Activity: Insufficiently Active (01/06/2022)   Exercise Vital Sign    Days of Exercise per Week: 7 days    Minutes of Exercise per Session: 10 min  Social Connections: Moderately Integrated (10/02/2017)   Social Connection and Isolation Panel [NHANES]    Frequency of Communication with Friends and Family: More than three times a week    Frequency of Social Gatherings with Friends and Family: More than three times a week    Attends Religious Services: More than 4 times per year    Active Member of [employer] or Organizations: Yes    Attends Archivist Meetings: More than 4 times per year    Marital Status: Widowed  Stress: No Stress Concern Present (01/06/2022)   [place]    Feeling of Stress : Not at all  Tobacco Use: Low Risk  (03/16/2022)    Patient History    Smoking Tobacco Use: Never    Smokeless Tobacco Use: Never    Passive Exposure: Not on file  Transportation Needs: No Transportation Needs (01/06/2022)   PRAPARE - Transportation    Lack of Transportation (Medical): No    Lack of Transportation (Non-Medical): No    CCM Care Plan  Allergies  Allergen Reactions   Penicillins Anaphylaxis    anaphylaxis  Has patient had a PCN reaction causing immediate rash, facial/tongue/throat swelling, SOB or lightheadedness with hypotension: Yes Has patient had a PCN reaction causing severe rash involving mucus membranes or skin necrosis: No Has patient had a PCN reaction that required hospitalization: No Has patient had a PCN reaction occurring within the last 10 years: No If all of the above answers are "NO", then may proceed with Cephalosporin use.    Sulfa Antibiotics Anaphylaxis   Codeine Other (See Comments) and Nausea And Vomiting    Gi problems    Statins Other (See Comments)    Leg pains   Aspirin Other (See Comments)    "burned my stomach intensely" Abdominal pain and burning    Medications Reviewed Today     Reviewed by Pleas Koch, NP (Nurse Practitioner) on 03/16/22 at 1246  Med List Status: <None>   Medication Order Taking? Sig Documenting Provider Last Dose Status Informant  acetaminophen (TYLENOL) 500 MG tablet [id number] Yes Take by mouth. [provider]  Taking Active   carbidopa-levodopa (SINEMET IR) 25-100 MG tablet [id number] Yes Take 2 tablets at 6 AM, 1 at 9AM, 1 tab at 12 PM,  2 at 3 PM, 1 at 6 PM and 2 at 9 PM. [employer], Otila Kluver A, FNP Taking Active   Carbidopa-Levodopa ER (SINEMET CR) 25-100 MG tablet controlled release [id number] Yes Take 1 tab at 9pm [provider] Taking Active   Cholecalciferol (VITAMIN D-3) 25 MCG (1000 UT) CAPS [id number] Yes Take 1 capsule by mouth daily. [provider] Taking Active Self  Cyanocobalamin (VITAMIN B-12) 5000 MCG SUBL [id number]  Yes Place 1 each under the tongue daily. [provider] Taking Active Self  DULoxetine (CYMBALTA) 60 MG capsule [id number] Yes TAKE ONE CAPSULE BY MOUTH TWICE DAILY FOR anxiety AND depression Pleas Koch, NP Taking Active   ezetimibe (ZETIA) 10 MG tablet [id number] Yes TAKE ONE TABLET BY MOUTH EVERY MORNING Crenshaw, Denice Bors, MD Taking Active   fluticasone (FLONASE) 50 MCG/ACT nasal spray [id number] Yes Place 2 sprays into both nostrils daily as needed for rhinitis or allergies. Pleas Koch, NP Taking Active   furosemide (LASIX) 20 MG tablet [id number] Yes Take 1 tablet (20 mg total) by mouth every morning. Lelon Perla, MD Taking Active   furosemide (LASIX) 20 MG tablet [id number]  Take 1 tablet (20 mg total) by mouth as needed. If needed above your regular daily dose of furosemide Alisa Graff, [place]  Expired 02/08/22 2359   furosemide (LASIX) 20 MG tablet [id number] Yes TAKE ONE TABLET BY MOUTH ONCE DAILY May take additional tablet daily if needed Alisa Graff, FNP Taking Active   gabapentin (NEURONTIN) 600 MG tablet [id number] Yes TAKE ONE TABLET BY MOUTH EVERYDAY AT BEDTIME [provider] Taking Active   hydrocortisone 2.5 % cream [id number] Yes Apply to affected under breasts 1-2 times a day as directed and as needed for itch. Can also use on face as needed for itch Brendolyn Patty, MD Taking Active   JARDIANCE 10 MG TABS tablet [id number] Yes TAKE ONE TABLET BY MOUTH BEFORE BREAKFAST Alisa Graff, FNP Taking Active   ketoconazole (NIZORAL) 2 % cream [id number] Yes Apply to affected areas under breast twice daily for rash until healed. Can also use on face as needed for dermatitis Brendolyn Patty, MD Taking Active   metoprolol tartrate (LOPRESSOR) 25 MG tablet [id number] Yes TAKE THREE TABLETS BY MOUTH TWICE DAILY Stanford Breed Denice Bors, MD Taking Active   nystatin cream (MYCOSTATIN) [id number] Yes Apply 1 Application topically 2 (two) times daily. Pleas Koch, NP   Active   oxybutynin (DITROPAN) 5 MG tablet [id number] Yes Take 5 mg by mouth 2 (two) times daily. [provider] Taking Active   sotalol (BETAPACE) 120 MG tablet [id number] Yes TAKE ONE TABLET BY MOUTH TWICE DAILY Crenshaw, Denice Bors, MD Taking Active   spironolactone (ALDACTONE) 25 MG tablet [id number] Yes Take 1 tablet (25 mg total) by mouth daily. Lelon Perla, MD Taking Active   tiZANidine (ZANAFLEX) 4 MG tablet [id number] Yes TAKE 2 TABLET (4 MG TOTAL) BY MOUTH DAILY FOR MUSCLE SPASMS AT BEDTIME. Pleas Koch, NP Taking Active             Patient Active Problem List   Diagnosis Date Noted   Skin ulcer with fat layer exposed ([place]) 03/16/2022   Vitamin B 12 deficiency 01/27/2022   Malaise 01/27/2022   Fever and chills 01/27/2022   Wheezing 01/27/2022   Vitamin D deficiency 01/27/2022  Hallucinations 11/24/2021   Abdominal pain 11/12/2021   Parkinson's disease ([place]) 07/29/2019   DDD (degenerative disc disease), lumbar 10/19/2018   Preventative health care 10/19/2018   Prediabetes 10/19/2018   (HFpEF) heart failure with preserved ejection fraction ([place]) 12/12/2017   Pericardial effusion with cardiac tamponade 11/13/2017   Paroxysmal atrial fibrillation ([place]) 11/05/2017   Acute cough 05/30/2017   Anxiety 03/21/2016   Insomnia 07/17/2015   MI (mitral incompetence) 12/10/2014   Major depressive disorder 12/09/2014   OSA (obstructive sleep apnea) 12/09/2014   GERD (gastroesophageal reflux disease) 12/09/2014   Chronic diastolic CHF (congestive heart failure) ([place])    HLD (hyperlipidemia)    Osteoarthritis of both knees    S/P left TKA 08/19/2014   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 (BMI) 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 ([place]) 04/17/2013   History of ductal carcinoma in situ (DCIS) of breast 11/05/2012   Essential hypertension, benign 11/05/2012    Immunization History  Administered Date(s) Administered   Fluad Quad(high Dose 65+) 06/11/2019,  06/08/2020, 05/17/2021   Influenza, High Dose Seasonal PF 08/10/2014, 05/05/2015, 07/30/2018   Influenza,inj,Quad PF,6+ Mos 06/07/2016   PFIZER(Purple Top)SARS-COV-2 Vaccination 09/03/2019, 09/24/2019, 12/21/2020, 05/22/2021   Pneumococcal Conjugate-13 02/06/2016   Pneumococcal Polysaccharide-23 09/22/2014   Td 09/01/2018   Zoster Recombinat (Shingrix) 10/19/2018, 12/27/2019    Conditions to be addressed/monitored:  Hypertension, Hyperlipidemia, Atrial Fibrillation, Heart Failure, Depression, and Anxiety, Parkinson's Disease  Care Plan : [place]  Updates made by Charlton Haws, RPH since 04/08/2022 12:00 AM     Problem: Hypertension, Hyperlipidemia, Atrial Fibrillation, Heart Failure, Depression, and Anxiety, Parkinson's Disease   Priority: High     Long-Range Goal: Disease Management   Start Date: 12/24/2020  Expected End Date: 01/13/2023  This Visit's Progress: On track  Recent Progress: On track  Priority: High  Note:   Current Barriers:  None identified  Pharmacist Clinical Goal(s):  Patient will achieve adherence to monitoring guidelines and medication adherence to achieve therapeutic efficacy through collaboration with PharmD and provider.   Interventions: 1:1 collaboration with Pleas Koch, NP regarding development and update of comprehensive plan of care as evidenced by provider attestation and co-signature Inter-disciplinary care team collaboration (see longitudinal plan of care) Comprehensive medication review performed; medication list updated in electronic medical record  Hypertension / Heart Failure (BP goal <140/90) -Controlled - per clinic and home readings  -Last ejection fraction: 65-70% (Date: 05/2018) -HF type: Diastolic -Current treatment: Metoprolol tartrate 25 mg - 3 tab BID - Appropriate, Effective, Safe, Accessible Furosemide 20 mg daily + extra 20 mg PRN -Appropriate, Effective, Safe, Accessible Sotalol 120 mg BID  -Appropriate, Effective, Safe, Accessible Spironolactone 25 mg daily -Appropriate, Effective, Safe, Accessible Jardiance 10 mg daily -Appropriate, Effective, Safe, Accessible -Medications previously tried: Lisinopril-HCTZ (stopped after metoprolol and sotalol were added for AFIB), diltiazem -Educated on Symptoms of hypotension and importance of maintaining adequate hydration; -Recommended to continue current medication  Atrial Fibrillation (Goal: prevent stroke and major bleeding) -Controlled - pt is not on anticoagulation due to hx of hemorrhagic pericardial effusion after starting Xarelto -CHADSVASC: 5 -Current treatment: Metoprolol tartrate 25 mg - 3 tab BID - Appropriate, Effective, Query Safe Sotalol 120 mg BID -Appropriate, Effective, Query Safe -Medications previously tried: Xarelto (hemorrhagic pericardial effusion), diltiazem -Reviewed bradycardia risk - she is on 3 rate control agents that can potentially cause bradycardia and has HR in low 50s in recent clinic visits; she does not endorse s/sx of bradycardia including shortness of breath, dizziness,  chest pain but does endorses some fatigue  -Recommended to continue current medication  Hyperlipidemia: (LDL goal < 100) -Controlled - LDL 84 (12/2020), improved from 132 with ezetimibe (37% reduction) -Current treatment: OTC fish oil - 2 capsules daily - Appropriate, Effective, Safe, Accessible Ezetimibe 10 mg daily -Appropriate, Effective, Safe, Accessible -Medications previously tried: statin intolerant  -Educated on Cholesterol goals; Benefits of statin for ASCVD risk reduction; -Recommended to continue current medication  Pre-Diabetes (A1c goal <7%) -Diet-Controlled - A1c 5.9% -Recommended continue lifestyle management  Parkinson's Disease (Goal: manage symptoms, slow progression) -Controlled -Follows with neurology (Dr Melrose Nakayama) -Current treatment  Carbidopa-levodopa IR 25-100 mg - 2 6a, 1 9a, 1 12p, 2 3p, 1 6p, 2 9p -  Appropriate, Effective, Safe, Accessible Carbidopa-levodopa CR 25-100 mg daily HS -Appropriate, Effective, Safe, Accessible -Medications previously tried: n/a  -Recommended to continue current medication  Depression/Anxiety (Goal: manage symptoms) -Controlled - per pt report. She recently had issues with hallucinations/sleep disturbance and was seen by PCP and Neurology (UTI was ruled out, Neurology prescribed Seroquel but this was stopped quickly due to drug interactions with sotalol per cardiology); today pt does not endorse hallucinations and feels she is sleeping OK, doing well with just duloxetine -PHQ9: 3 (10/2021) - minimal depression -GAD7: 0 (09/2021) - minimal anxiety -Connected with Dr Melrose Nakayama (neurology) for mental health support -Current treatment: Duloxetine 60 mg BID - Appropriate, Effective, Safe, Accessible -Medications previously tried/failed: Abilify, Seroquel (DDI sotalol - Qtc) -Educated on Benefits of medication for symptom control -Recommended to continue current medication; advised pt to contact PCP or Dr Melrose Nakayama if hallucinations recur  Patient Goals/Self-Care Activities Patient will:  - take medications as prescribed as evidenced by patient report and record review focus on medication adherence by pill packs check blood pressure/pulse daily, document, and provide at future appointments engage in dietary modifications by limiting carbs and high-cholesterol foods       Medication Assistance: None required.  Patient affirms current coverage meets needs.  Compliance/Adherence/Medication fill history: Care Gaps: None  Star-Rating Drugs: Jardiance - PDC 97%  Medication Access: Within the past 30 days, how often has patient missed a dose of medication? 0 Is a pillbox or other method used to improve adherence? Yes  Factors that may affect medication adherence? no barriers identified Are meds synced by current pharmacy? Yes  Are meds delivered by current pharmacy?  Yes  Does patient experience delays in picking up medications due to transportation concerns? No   [facility] Reviewed: Is patient disadvantaged to use [facility]?: No  Current Rx insurance plan: [employer] [place] Name and location of Current pharmacy:  [facility] - [place], [place] - [address] [place] [address] Phone: [phone] Fax: [phone]  Packs 30-ds (last 03/31/22):  Sotalol 120mg  1 tablet twice daily (1 breakfast and 1 evening meal) Tizanidine 4mg  2 tablets daily (2 bedtime) Ezetimibe 10mg  1 tablet daily (breakfast) Gabapentin 600 mg - 1 tablet daily (bedtime) Spironolactone 25mg  tablet 1 tablet (breakfast) Jardiance 10 mg 1 tablet daily (before breakfast) Duloxetine 60 mg 1 tablet twice a day (1 breakfast, 1 evening meal) Oxybutynin 5 mg 2 tablets daily (1 tablet breakfast 1 tablet evening meal) Metoprolol 25mg  3 tablets twice a day (3 breakfast and 3 evening meal) Furosemide 20 mg - 1 tablet daily (breakfast) Vitamin D - 1000 units - 1 tablet daily (breakfast)   VIAL medications: w/o safety caps Carbidopa-Levodopa 25-100mg Take 2 tablets at 6 AM, 1 at 9AM,  1 tab at 12 PM,  2 at 3 PM, 1 at 6 PM and 2 at 9 PM  May take extra tab as need.  Furosemide 20 mg - 1 tablet PRN above regular daily dose of Furosemide  Carbidopa-Levodopa ER 25-145m - 1 tablet at 9:00 pm  Patient declined the following medications this month: Gabapentin 104m- Patient has three bottles on hand.  Fluticasone Propionate 50 mcg -patient has four bottles on hand.   Care Plan and Follow Up Patient Decision:  Patient agrees to Care Plan and Follow-up.  Plan: Telephone follow up appointment with care management team member scheduled for:  6 months  LiCharlene BrookePharmD, BCACP Clinical Pharmacist [phone]

## 2022-04-07 DIAGNOSIS — Z9011 Acquired absence of right breast and nipple: Secondary | ICD-10-CM | POA: Diagnosis not present

## 2022-04-07 DIAGNOSIS — M5136 Other intervertebral disc degeneration, lumbar region: Secondary | ICD-10-CM | POA: Diagnosis not present

## 2022-04-07 DIAGNOSIS — Z853 Personal history of malignant neoplasm of breast: Secondary | ICD-10-CM | POA: Diagnosis not present

## 2022-04-07 DIAGNOSIS — K5900 Constipation, unspecified: Secondary | ICD-10-CM | POA: Diagnosis not present

## 2022-04-07 DIAGNOSIS — Z7984 Long term (current) use of oral hypoglycemic drugs: Secondary | ICD-10-CM | POA: Diagnosis not present

## 2022-04-07 DIAGNOSIS — I110 Hypertensive heart disease with heart failure: Secondary | ICD-10-CM | POA: Diagnosis not present

## 2022-04-07 DIAGNOSIS — K219 Gastro-esophageal reflux disease without esophagitis: Secondary | ICD-10-CM | POA: Diagnosis not present

## 2022-04-07 DIAGNOSIS — G5602 Carpal tunnel syndrome, left upper limb: Secondary | ICD-10-CM | POA: Diagnosis not present

## 2022-04-07 DIAGNOSIS — Z90711 Acquired absence of uterus with remaining cervical stump: Secondary | ICD-10-CM | POA: Diagnosis not present

## 2022-04-07 DIAGNOSIS — I509 Heart failure, unspecified: Secondary | ICD-10-CM | POA: Diagnosis not present

## 2022-04-07 DIAGNOSIS — G20 Parkinson's disease: Secondary | ICD-10-CM | POA: Diagnosis not present

## 2022-04-07 DIAGNOSIS — M109 Gout, unspecified: Secondary | ICD-10-CM | POA: Diagnosis not present

## 2022-04-07 DIAGNOSIS — Z9181 History of falling: Secondary | ICD-10-CM | POA: Diagnosis not present

## 2022-04-07 DIAGNOSIS — Z96653 Presence of artificial knee joint, bilateral: Secondary | ICD-10-CM | POA: Diagnosis not present

## 2022-04-07 DIAGNOSIS — I4891 Unspecified atrial fibrillation: Secondary | ICD-10-CM | POA: Diagnosis not present

## 2022-04-07 DIAGNOSIS — Z9049 Acquired absence of other specified parts of digestive tract: Secondary | ICD-10-CM | POA: Diagnosis not present

## 2022-04-07 DIAGNOSIS — R69 Illness, unspecified: Secondary | ICD-10-CM | POA: Diagnosis not present

## 2022-04-07 DIAGNOSIS — G2581 Restless legs syndrome: Secondary | ICD-10-CM | POA: Diagnosis not present

## 2022-04-07 DIAGNOSIS — E669 Obesity, unspecified: Secondary | ICD-10-CM | POA: Diagnosis not present

## 2022-04-07 DIAGNOSIS — G4733 Obstructive sleep apnea (adult) (pediatric): Secondary | ICD-10-CM | POA: Diagnosis not present

## 2022-04-07 DIAGNOSIS — N6019 Diffuse cystic mastopathy of unspecified breast: Secondary | ICD-10-CM | POA: Diagnosis not present

## 2022-04-07 DIAGNOSIS — M5030 Other cervical disc degeneration, unspecified cervical region: Secondary | ICD-10-CM | POA: Diagnosis not present

## 2022-04-07 DIAGNOSIS — E785 Hyperlipidemia, unspecified: Secondary | ICD-10-CM | POA: Diagnosis not present

## 2022-04-08 DIAGNOSIS — I509 Heart failure, unspecified: Secondary | ICD-10-CM | POA: Diagnosis not present

## 2022-04-08 DIAGNOSIS — N6019 Diffuse cystic mastopathy of unspecified breast: Secondary | ICD-10-CM | POA: Diagnosis not present

## 2022-04-08 DIAGNOSIS — Z90711 Acquired absence of uterus with remaining cervical stump: Secondary | ICD-10-CM | POA: Diagnosis not present

## 2022-04-08 DIAGNOSIS — Z7984 Long term (current) use of oral hypoglycemic drugs: Secondary | ICD-10-CM | POA: Diagnosis not present

## 2022-04-08 DIAGNOSIS — Z9011 Acquired absence of right breast and nipple: Secondary | ICD-10-CM | POA: Diagnosis not present

## 2022-04-08 DIAGNOSIS — R69 Illness, unspecified: Secondary | ICD-10-CM | POA: Diagnosis not present

## 2022-04-08 DIAGNOSIS — G20 Parkinson's disease: Secondary | ICD-10-CM | POA: Diagnosis not present

## 2022-04-08 DIAGNOSIS — K5900 Constipation, unspecified: Secondary | ICD-10-CM | POA: Diagnosis not present

## 2022-04-08 DIAGNOSIS — Z853 Personal history of malignant neoplasm of breast: Secondary | ICD-10-CM | POA: Diagnosis not present

## 2022-04-08 DIAGNOSIS — Z96653 Presence of artificial knee joint, bilateral: Secondary | ICD-10-CM | POA: Diagnosis not present

## 2022-04-08 DIAGNOSIS — I110 Hypertensive heart disease with heart failure: Secondary | ICD-10-CM | POA: Diagnosis not present

## 2022-04-08 DIAGNOSIS — K219 Gastro-esophageal reflux disease without esophagitis: Secondary | ICD-10-CM | POA: Diagnosis not present

## 2022-04-08 DIAGNOSIS — G5602 Carpal tunnel syndrome, left upper limb: Secondary | ICD-10-CM | POA: Diagnosis not present

## 2022-04-08 DIAGNOSIS — G2581 Restless legs syndrome: Secondary | ICD-10-CM | POA: Diagnosis not present

## 2022-04-08 DIAGNOSIS — M5136 Other intervertebral disc degeneration, lumbar region: Secondary | ICD-10-CM | POA: Diagnosis not present

## 2022-04-08 DIAGNOSIS — G4733 Obstructive sleep apnea (adult) (pediatric): Secondary | ICD-10-CM | POA: Diagnosis not present

## 2022-04-08 DIAGNOSIS — M5030 Other cervical disc degeneration, unspecified cervical region: Secondary | ICD-10-CM | POA: Diagnosis not present

## 2022-04-08 DIAGNOSIS — M109 Gout, unspecified: Secondary | ICD-10-CM | POA: Diagnosis not present

## 2022-04-08 DIAGNOSIS — Z9181 History of falling: Secondary | ICD-10-CM | POA: Diagnosis not present

## 2022-04-08 DIAGNOSIS — E669 Obesity, unspecified: Secondary | ICD-10-CM | POA: Diagnosis not present

## 2022-04-08 DIAGNOSIS — Z9049 Acquired absence of other specified parts of digestive tract: Secondary | ICD-10-CM | POA: Diagnosis not present

## 2022-04-08 DIAGNOSIS — I4891 Unspecified atrial fibrillation: Secondary | ICD-10-CM | POA: Diagnosis not present

## 2022-04-08 DIAGNOSIS — E785 Hyperlipidemia, unspecified: Secondary | ICD-10-CM | POA: Diagnosis not present

## 2022-04-08 NOTE — Patient Instructions (Signed)
Visit Information  Phone number for Pharmacist: [phone]   Goals Addressed   None     Care Plan : [place]  Updates made by Charlton Haws, RPH since 04/08/2022 12:00 AM     Problem: Hypertension, Hyperlipidemia, Atrial Fibrillation, Heart Failure, Depression, and Anxiety, Parkinson's Disease   Priority: High     Long-Range Goal: Disease Management   Start Date: 12/24/2020  Expected End Date: 01/13/2023  This Visit's Progress: On track  Recent Progress: On track  Priority: High  Note:   Current Barriers:  None identified  Pharmacist Clinical Goal(s):  Patient will achieve adherence to monitoring guidelines and medication adherence to achieve therapeutic efficacy through collaboration with PharmD and provider.   Interventions: 1:1 collaboration with Pleas Koch, NP regarding development and update of comprehensive plan of care as evidenced by provider attestation and co-signature Inter-disciplinary care team collaboration (see longitudinal plan of care) Comprehensive medication review performed; medication list updated in electronic medical record  Hypertension / Heart Failure (BP goal <140/90) -Controlled - per clinic and home readings  -Last ejection fraction: 65-70% (Date: 05/2018) -HF type: Diastolic -Current treatment: Metoprolol tartrate 25 mg - 3 tab BID - Appropriate, Effective, Safe, Accessible Furosemide 20 mg daily + extra 20 mg PRN -Appropriate, Effective, Safe, Accessible Sotalol 120 mg BID -Appropriate, Effective, Safe, Accessible Spironolactone 25 mg daily -Appropriate, Effective, Safe, Accessible Jardiance 10 mg daily -Appropriate, Effective, Safe, Accessible -Medications previously tried: Lisinopril-HCTZ (stopped after metoprolol and sotalol were added for AFIB), diltiazem -Educated on Symptoms of hypotension and importance of maintaining adequate hydration; -Recommended to continue current medication  Atrial Fibrillation  (Goal: prevent stroke and major bleeding) -Controlled - pt is not on anticoagulation due to hx of hemorrhagic pericardial effusion after starting Xarelto -CHADSVASC: 5 -Current treatment: Metoprolol tartrate 25 mg - 3 tab BID - Appropriate, Effective, Query Safe Sotalol 120 mg BID -Appropriate, Effective, Query Safe -Medications previously tried: Xarelto (hemorrhagic pericardial effusion), diltiazem -Reviewed bradycardia risk - she is on 3 rate control agents that can potentially cause bradycardia and has HR in low 50s in recent clinic visits; she does not endorse s/sx of bradycardia including shortness of breath, dizziness, chest pain but does endorses some fatigue  -Recommended to continue current medication  Hyperlipidemia: (LDL goal < 100) -Controlled - LDL 84 (12/2020), improved from 132 with ezetimibe (37% reduction) -Current treatment: OTC fish oil - 2 capsules daily - Appropriate, Effective, Safe, Accessible Ezetimibe 10 mg daily -Appropriate, Effective, Safe, Accessible -Medications previously tried: statin intolerant  -Educated on Cholesterol goals; Benefits of statin for ASCVD risk reduction; -Recommended to continue current medication  Pre-Diabetes (A1c goal <7%) -Diet-Controlled - A1c 5.9% -Recommended continue lifestyle management  Parkinson's Disease (Goal: manage symptoms, slow progression) -Controlled -Follows with neurology (Dr Melrose Nakayama) -Current treatment  Carbidopa-levodopa IR 25-100 mg - 2 6a, 1 9a, 1 12p, 2 3p, 1 6p, 2 9p - Appropriate, Effective, Safe, Accessible Carbidopa-levodopa CR 25-100 mg daily HS -Appropriate, Effective, Safe, Accessible -Medications previously tried: n/a  -Recommended to continue current medication  Depression/Anxiety (Goal: manage symptoms) -Controlled - per pt report. She recently had issues with hallucinations/sleep disturbance and was seen by PCP and Neurology (UTI was ruled out, Neurology prescribed Seroquel but this was stopped  quickly due to drug interactions with sotalol per cardiology); today pt does not endorse hallucinations and feels she is sleeping OK, doing well with just duloxetine -PHQ9: 3 (10/2021) - minimal depression -GAD7: 0 (09/2021) - minimal anxiety -Connected with Dr Melrose Nakayama (  neurology) for mental health support -Current treatment: Duloxetine 60 mg BID - Appropriate, Effective, Safe, Accessible -Medications previously tried/failed: Abilify, Seroquel (DDI sotalol - Qtc) -Educated on Benefits of medication for symptom control -Recommended to continue current medication; advised pt to contact PCP or Dr Melrose Nakayama if hallucinations recur  Patient Goals/Self-Care Activities Patient will:  - take medications as prescribed as evidenced by patient report and record review focus on medication adherence by pill packs check blood pressure/pulse daily, document, and provide at future appointments engage in dietary modifications by limiting carbs and high-cholesterol foods      Patient verbalizes understanding of instructions and care plan provided today and agrees to view in [place]. Active MyChart status and patient understanding of how to access instructions and care plan via MyChart confirmed with patient.    Telephone follow up appointment with pharmacy team member scheduled for: 6 months  Charlene Brooke, PharmD, [facility] Clinical Pharmacist [facility] at [facility] [phone]

## 2022-04-10 DIAGNOSIS — Z96653 Presence of artificial knee joint, bilateral: Secondary | ICD-10-CM | POA: Diagnosis not present

## 2022-04-10 DIAGNOSIS — M5030 Other cervical disc degeneration, unspecified cervical region: Secondary | ICD-10-CM | POA: Diagnosis not present

## 2022-04-10 DIAGNOSIS — I509 Heart failure, unspecified: Secondary | ICD-10-CM | POA: Diagnosis not present

## 2022-04-10 DIAGNOSIS — Z9181 History of falling: Secondary | ICD-10-CM | POA: Diagnosis not present

## 2022-04-10 DIAGNOSIS — E669 Obesity, unspecified: Secondary | ICD-10-CM | POA: Diagnosis not present

## 2022-04-10 DIAGNOSIS — Z7984 Long term (current) use of oral hypoglycemic drugs: Secondary | ICD-10-CM | POA: Diagnosis not present

## 2022-04-10 DIAGNOSIS — G4733 Obstructive sleep apnea (adult) (pediatric): Secondary | ICD-10-CM | POA: Diagnosis not present

## 2022-04-10 DIAGNOSIS — N6019 Diffuse cystic mastopathy of unspecified breast: Secondary | ICD-10-CM | POA: Diagnosis not present

## 2022-04-10 DIAGNOSIS — G5602 Carpal tunnel syndrome, left upper limb: Secondary | ICD-10-CM | POA: Diagnosis not present

## 2022-04-10 DIAGNOSIS — I4891 Unspecified atrial fibrillation: Secondary | ICD-10-CM | POA: Diagnosis not present

## 2022-04-10 DIAGNOSIS — K219 Gastro-esophageal reflux disease without esophagitis: Secondary | ICD-10-CM | POA: Diagnosis not present

## 2022-04-10 DIAGNOSIS — G2581 Restless legs syndrome: Secondary | ICD-10-CM | POA: Diagnosis not present

## 2022-04-10 DIAGNOSIS — Z853 Personal history of malignant neoplasm of breast: Secondary | ICD-10-CM | POA: Diagnosis not present

## 2022-04-10 DIAGNOSIS — Z9011 Acquired absence of right breast and nipple: Secondary | ICD-10-CM | POA: Diagnosis not present

## 2022-04-10 DIAGNOSIS — I110 Hypertensive heart disease with heart failure: Secondary | ICD-10-CM | POA: Diagnosis not present

## 2022-04-10 DIAGNOSIS — G20 Parkinson's disease: Secondary | ICD-10-CM | POA: Diagnosis not present

## 2022-04-10 DIAGNOSIS — Z90711 Acquired absence of uterus with remaining cervical stump: Secondary | ICD-10-CM | POA: Diagnosis not present

## 2022-04-10 DIAGNOSIS — K5900 Constipation, unspecified: Secondary | ICD-10-CM | POA: Diagnosis not present

## 2022-04-10 DIAGNOSIS — M109 Gout, unspecified: Secondary | ICD-10-CM | POA: Diagnosis not present

## 2022-04-10 DIAGNOSIS — M5136 Other intervertebral disc degeneration, lumbar region: Secondary | ICD-10-CM | POA: Diagnosis not present

## 2022-04-10 DIAGNOSIS — R69 Illness, unspecified: Secondary | ICD-10-CM | POA: Diagnosis not present

## 2022-04-10 DIAGNOSIS — Z9049 Acquired absence of other specified parts of digestive tract: Secondary | ICD-10-CM | POA: Diagnosis not present

## 2022-04-10 DIAGNOSIS — E785 Hyperlipidemia, unspecified: Secondary | ICD-10-CM | POA: Diagnosis not present

## 2022-04-11 ENCOUNTER — Encounter: Payer: Self-pay | Admitting: Family

## 2022-04-11 ENCOUNTER — Ambulatory Visit: Payer: Medicare HMO | Attending: Family | Admitting: Family

## 2022-04-11 ENCOUNTER — Telehealth: Payer: Self-pay | Admitting: Primary Care

## 2022-04-11 VITALS — BP 116/64 | HR 67 | Resp 14 | Ht 62.0 in | Wt 142.5 lb

## 2022-04-11 DIAGNOSIS — I10 Essential (primary) hypertension: Secondary | ICD-10-CM | POA: Diagnosis not present

## 2022-04-11 DIAGNOSIS — F419 Anxiety disorder, unspecified: Secondary | ICD-10-CM | POA: Insufficient documentation

## 2022-04-11 DIAGNOSIS — I5032 Chronic diastolic (congestive) heart failure: Secondary | ICD-10-CM | POA: Insufficient documentation

## 2022-04-11 DIAGNOSIS — G20 Parkinson's disease: Secondary | ICD-10-CM | POA: Diagnosis not present

## 2022-04-11 DIAGNOSIS — Z853 Personal history of malignant neoplasm of breast: Secondary | ICD-10-CM | POA: Diagnosis not present

## 2022-04-11 DIAGNOSIS — F32A Depression, unspecified: Secondary | ICD-10-CM | POA: Insufficient documentation

## 2022-04-11 DIAGNOSIS — G4730 Sleep apnea, unspecified: Secondary | ICD-10-CM | POA: Insufficient documentation

## 2022-04-11 DIAGNOSIS — I4891 Unspecified atrial fibrillation: Secondary | ICD-10-CM | POA: Insufficient documentation

## 2022-04-11 DIAGNOSIS — G4733 Obstructive sleep apnea (adult) (pediatric): Secondary | ICD-10-CM

## 2022-04-11 DIAGNOSIS — E785 Hyperlipidemia, unspecified: Secondary | ICD-10-CM | POA: Insufficient documentation

## 2022-04-11 DIAGNOSIS — I110 Hypertensive heart disease with heart failure: Secondary | ICD-10-CM | POA: Diagnosis not present

## 2022-04-11 DIAGNOSIS — I480 Paroxysmal atrial fibrillation: Secondary | ICD-10-CM | POA: Diagnosis not present

## 2022-04-11 DIAGNOSIS — R443 Hallucinations, unspecified: Secondary | ICD-10-CM | POA: Diagnosis not present

## 2022-04-11 DIAGNOSIS — K219 Gastro-esophageal reflux disease without esophagitis: Secondary | ICD-10-CM | POA: Insufficient documentation

## 2022-04-11 DIAGNOSIS — M5030 Other cervical disc degeneration, unspecified cervical region: Secondary | ICD-10-CM | POA: Insufficient documentation

## 2022-04-11 DIAGNOSIS — R69 Illness, unspecified: Secondary | ICD-10-CM | POA: Diagnosis not present

## 2022-04-11 NOTE — Progress Notes (Signed)
Patient ID: Selena Bush, female    DOB: 06/28/1946, 76 y.o.   MRN: [id number]   Selena Bush is a 76 y/o female with a history of breast cancer, hyperlipidemia, HNT, anxiety, atrial fibrillation, parkinson's disorder, DDD, depression, GERD, gout, obstructive sleep apnea and chronic heart failure.   Echo report from 04/29/21 reviewed and showed an EF of 65-70% along with mild LVH, moderately elevated PA pressure, severe LAE and possible PFO.  Hasn't been admitted or been in the ED in the last 6 months.   She presents today for a follow-up visit with a chief complaint of swelling in her ankles. She has associated fatigue along with this and describes this as chronic in nature. She denies any dizziness, headaches, SOB, cough, chest pain/pressure, abdominal distention nor weight gain.   States that her parkinson's disease is getting worse and that she is not following a low-sodium diet and eating out more with friends. Says that this swelling began two days ago and her caregiver gave her an extra furosemide but that it wasn't helping.   Past Medical History:  Diagnosis Date   Anxiety    Atrial fibrillation ([place])    Cat bite of right hand 03/25/2019   Chronic diastolic CHF (congestive heart failure) ([place])    a. echo 09/2014: EF 58-85%, diastolic dysfunction, mild LVH, nl RV size & systolic function, mildly dilated LA (4.3 cm), mild MR/TR, mildly elevated PASP 36.7 mm Hg   DDD (degenerative disc disease), cervical    DDD (degenerative disc disease), lumbar    Depression    Diffuse cystic mastopathy 2014   Dyspnea    Dysrhythmia    GERD (gastroesophageal reflux disease)    Gout    Headache    rare   HLD (hyperlipidemia)    a. statin intolerant 2/2 myalgias   Hx of dysplastic nevus 12/25/2018   L medial ankle   Hypercholesterolemia    Hypertension    Malignant neoplasm of upper-outer quadrant of female breast ([place]) 10/2012   Papillary DCIS, sentinel node negative. DR/PR positive. PARTIAL  RIGHT MASTECTOMY FOR BREAST CANCER--HAD RADIATION - NO CHEMO --DR. CRYSTAL Braymer ONCOLOGIST   Obesity    OSA on CPAP    Osteoarthritis of both knees    a. s/p right TKA 04/2013 & left TKA 09/2014   Otitis externa    Parkinson disease [facility])    Personal history of radiation therapy 2015   RIGHT breast-mammosite per pt   Pneumonia of both lungs due to infectious organism 01/08/2021   Sleep difficulties    LUNESTA HAS HELPED   Vaginal cyst     Past Surgical History:  Procedure Laterality Date   ABDOMINAL HYSTERECTOMY  1992   DUB; fibroids; endometriosis.  One remaining ovary.     BREAST LUMPECTOMY Right 2015   Papillary DCIS, sentinel node negative. DR/PR positive. PARTIAL RIGHT MASTECTOMY FOR BREAST CANCER--HAD RADIATION - NO CHEMO --DR. CRYSTAL Fallon ONCOLOGIST   BREAST SURGERY Right 10/2012   Wide excision,APB RT 10 mm papillary DCIS, ER/PR positive. Sentinel node negative. Partial breast radiation.   CATARACT EXTRACTION, BILATERAL  02/13/2016   [facility].   CHOLECYSTECTOMY  1994   COLONOSCOPY  2015   1 benign polyp-every 5 years/ Dr Candace Cruise   COLONOSCOPY WITH PROPOFOL N/A 02/10/2021   Procedure: COLONOSCOPY WITH PROPOFOL;  Surgeon: Robert Bellow, MD;  Location: [facility] ENDOSCOPY;  Service: Endoscopy;  Laterality: N/A;   ERCP  1995   JOINT REPLACEMENT Right 04/2013   knee  PERICARDIOCENTESIS N/A 11/13/2017   Procedure: PERICARDIOCENTESIS;  Surgeon: Nelva Bush, MD;  Location: [place] CV LAB;  Service: Cardiovascular;  Laterality: N/A;   TOTAL KNEE ARTHROPLASTY Right 04/16/2013   Procedure: RIGHT TOTAL KNEE ARTHROPLASTY;  Surgeon: Mauri Pole, MD;  Location: [facility] ORS;  Service: Orthopedics;  Laterality: Right;   TOTAL KNEE ARTHROPLASTY Left 09/15/2014   Procedure: LEFT TOTAL KNEE ARTHROPLASTY;  Surgeon: Mauri Pole, MD;  Location: [facility] ORS;  Service: Orthopedics;  Laterality: Left;   TUBAL LIGATION  1979   UPPER GI ENDOSCOPY     Family History  Problem Relation  Age of Onset   Colon cancer Mother    Cancer Mother 64       colon cancer   Melanoma Father    Cancer Father 12       melanoma   Colon cancer Maternal Grandfather    Breast cancer Maternal Grandfather    Melanoma Sister    Melanoma Sister    Social History   Tobacco Use   Smoking status: Never   Smokeless tobacco: Never   Tobacco comments:    social smoker as a teen  Substance Use Topics   Alcohol use: No   Allergies  Allergen Reactions   Penicillins Anaphylaxis    anaphylaxis  Has patient had a PCN reaction causing immediate rash, facial/tongue/throat swelling, SOB or lightheadedness with hypotension: Yes Has patient had a PCN reaction causing severe rash involving mucus membranes or skin necrosis: No Has patient had a PCN reaction that required hospitalization: No Has patient had a PCN reaction occurring within the last 10 years: No If all of the above answers are "NO", then may proceed with Cephalosporin use.    Sulfa Antibiotics Anaphylaxis   Codeine Other (See Comments) and Nausea And Vomiting    Gi problems    Statins Other (See Comments)    Leg pains   Aspirin Other (See Comments)    "burned my stomach intensely" Abdominal pain and burning   Prior to Admission medications   Medication Sig Start Date End Date Taking? Authorizing Provider  acetaminophen (TYLENOL) 500 MG tablet Take by mouth.   Yes [provider]  carbidopa-levodopa (SINEMET IR) 25-100 MG tablet Take 2 tablets at 6 AM, 1 at 9AM, 1 tab at 12 PM,  2 at 3 PM, 1 at 6 PM and 2 at 9 PM. 11/25/21  Yes Hackney, Otila Kluver A, FNP  Carbidopa-Levodopa ER (SINEMET CR) 25-100 MG tablet controlled release Take 1 tab at 9pm 10/19/21  Yes [provider]  Cholecalciferol (VITAMIN D-3) 25 MCG (1000 UT) CAPS Take 1 capsule by mouth daily.   Yes [provider]  Cyanocobalamin (VITAMIN B-12) 5000 MCG SUBL Place 1 each under the tongue daily.   Yes [provider]  DULoxetine (CYMBALTA) 60  MG capsule TAKE ONE CAPSULE BY MOUTH TWICE DAILY FOR anxiety AND depression 07/21/21  Yes Pleas Koch, NP  ezetimibe (ZETIA) 10 MG tablet TAKE ONE TABLET BY MOUTH EVERY MORNING 06/23/21  Yes Lelon Perla, MD  fluticasone (FLONASE) 50 MCG/ACT nasal spray Place 2 sprays into both nostrils daily as needed for rhinitis or allergies. 07/21/21  Yes Pleas Koch, NP  furosemide (LASIX) 20 MG tablet Take 1 tablet (20 mg total) by mouth every morning. 06/07/21  Yes Lelon Perla, MD  furosemide (LASIX) 20 MG tablet Take 1 tablet (20 mg total) by mouth as needed. If needed above your regular daily dose of furosemide 10/12/21  Yes Darylene Price A, FNP  furosemide (LASIX) 20 MG tablet TAKE ONE TABLET BY MOUTH ONCE DAILY May take additional tablet daily if needed 02/17/22  Yes Hackney, Tina A, FNP  gabapentin (NEURONTIN) 600 MG tablet TAKE ONE TABLET BY MOUTH EVERYDAY AT BEDTIME 01/18/22  Yes [provider]  hydrocortisone 2.5 % cream Apply to affected under breasts 1-2 times a day as directed and as needed for itch. Can also use on face as needed for itch 01/05/21  Yes Brendolyn Patty, MD  JARDIANCE 10 MG TABS tablet TAKE ONE TABLET BY MOUTH BEFORE BREAKFAST 12/20/21  Yes Darylene Price A, FNP  ketoconazole (NIZORAL) 2 % cream Apply to affected areas under breast twice daily for rash until healed. Can also use on face as needed for dermatitis 01/05/21  Yes Brendolyn Patty, MD  metoprolol tartrate (LOPRESSOR) 25 MG tablet TAKE THREE TABLETS BY MOUTH TWICE DAILY 11/19/21  Yes Lelon Perla, MD  nystatin cream (MYCOSTATIN) Apply 1 Application topically 2 (two) times daily. 03/16/22  Yes Pleas Koch, NP  oxybutynin (DITROPAN) 5 MG tablet Take 5 mg by mouth 2 (two) times daily. 11/01/21  Yes [provider]  sotalol (BETAPACE) 120 MG tablet TAKE ONE TABLET BY MOUTH TWICE DAILY 05/26/21  Yes Lelon Perla, MD  spironolactone (ALDACTONE) 25 MG tablet Take 1 tablet (25 mg total) by mouth  daily. 02/18/22  Yes Lelon Perla, MD  tiZANidine (ZANAFLEX) 4 MG tablet TAKE 2 TABLET (4 MG TOTAL) BY MOUTH DAILY FOR MUSCLE SPASMS AT BEDTIME. 06/22/21  Yes Pleas Koch, NP   Review of Systems  Constitutional:  Positive for fatigue. Negative for appetite change.  HENT:  Negative for congestion, postnasal drip and sore throat.   Eyes: Negative.   Respiratory:  Negative for cough, chest tightness and shortness of breath.   Cardiovascular:  Positive for leg swelling. Negative for chest pain and palpitations.  Gastrointestinal:  Negative for abdominal distention, abdominal pain and diarrhea.  Endocrine: Negative.   Genitourinary: Negative.   Musculoskeletal:  Negative for arthralgias, back pain and neck pain.  Skin:  Negative for wound (left elbow).  Allergic/Immunologic: Negative.   Neurological:  Negative for dizziness, light-headedness and headaches.  Hematological:  Negative for adenopathy. Bruises/bleeds easily.  Psychiatric/Behavioral:  Positive for sleep disturbance (wakes frequently throughout the night and not wearing her CPAP). Negative for dysphoric mood.     Vitals:   04/11/22 1342  BP: 116/64  Pulse: 67  Resp: 14  SpO2: 98%   Filed Weights   04/11/22 1342  Weight: 142 lb 8 oz (64.6 kg)    Lab Results  Component Value Date   CREATININE 1.20 11/24/2021   CREATININE 1.27 (H) 07/13/2021   CREATININE 1.50 (H) 06/10/2021     Physical Exam Vitals and nursing note reviewed. Exam conducted with a chaperone present (friend).  Constitutional:      General: She is not in acute distress.    Appearance: Normal appearance. She is not toxic-appearing.  HENT:     Head: Normocephalic and atraumatic.  Neck:     Vascular: No JVD.  Cardiovascular:     Rate and Rhythm: Normal rate and regular rhythm.     Heart sounds: Murmur heard.  Pulmonary:     Effort: Pulmonary effort is normal. No respiratory distress.     Breath sounds: Normal breath sounds. No wheezing or  rales.  Abdominal:     General: There is no distension.     Palpations: Abdomen  is soft.     Tenderness: There is no abdominal tenderness.  Musculoskeletal:        General: No tenderness.     Cervical back: Normal range of motion and neck supple.     Right lower leg: Edema (ankle) present.     Left lower leg: Edema (ankle) present.  Skin:    General: Skin is warm and dry.     Findings: Bruising (tops of both hands) present.  Neurological:     Mental Status: She is alert and oriented to person, place, and time. Mental status is at baseline.  Psychiatric:        Mood and Affect: Mood normal.        Behavior: Behavior normal.        Thought Content: Thought content normal.     Assessment & Plan:  1: Chronic heart failure with preserved ejection fraction with structural changes (LVH/LAE)- - NYHA class II - euvolemic today - weighing daily; reminded to call for an overnight weight gain of > 2 pounds or a weekly weight gain of > 5 pounds - weight 142 lbs down 17 pounds from last visit here 11/25/21 - saw cardiology Stanford Breed) 02/08/22 - on GDMT of SGLT2 - not following a low sodium diet and eating out often; just ate out at [employer] after church - with recent falls, will not change to entresto at this time - can continue furosemide '20mg'$  daily with additional '20mg'$  PRN for above weight gain or pedal edema; they are to call if she starts needing the PRN dose more often - start wearing compression socks daily - BNP 06/08/21 was 122.8  2: HTN- - BP looks good (116/64) - saw PCP Carlis Abbott) 03/16/22 - BMP 11/24/21 reviewed and showed sodium 132, K 4.3, creatinine 1.2 & GFR 44.90  3: Atrial fibrillation- - RR today  4: Sleep apnea- - normally wears CPAP - reports sleeping well  5: Hallucinations- - these started a few weeks after abilify was started   Medication list reviewed.   Return in 3 months, sooner if needed.

## 2022-04-11 NOTE — Telephone Encounter (Signed)
Home Health verbal orders Caller Name:Pam Agency Name: [facility] number: [phone]  Requesting OT/PT/Skilled nursing/Social Work/Speech:  Reason:skilled nursing  Frequency:  Please forward to [facility] pool or providers CMA

## 2022-04-11 NOTE — Telephone Encounter (Signed)
Approved.  

## 2022-04-11 NOTE — Telephone Encounter (Signed)
Left message on voicemail for Selena Bush to call the office back.

## 2022-04-11 NOTE — Patient Instructions (Addendum)
Continue weighing daily and call for an overnight weight gain of 3 pounds or more or a weekly weight gain of more than 5 pounds.  Wear compression socks daily remove at night.

## 2022-04-11 NOTE — Telephone Encounter (Signed)
Pam called back, relayed approval as listed below for skilled nursing

## 2022-04-14 DIAGNOSIS — I5032 Chronic diastolic (congestive) heart failure: Secondary | ICD-10-CM | POA: Diagnosis not present

## 2022-04-14 DIAGNOSIS — E7800 Pure hypercholesterolemia, unspecified: Secondary | ICD-10-CM | POA: Diagnosis not present

## 2022-04-14 DIAGNOSIS — Z9181 History of falling: Secondary | ICD-10-CM | POA: Diagnosis not present

## 2022-04-14 DIAGNOSIS — G5602 Carpal tunnel syndrome, left upper limb: Secondary | ICD-10-CM | POA: Diagnosis not present

## 2022-04-14 DIAGNOSIS — I480 Paroxysmal atrial fibrillation: Secondary | ICD-10-CM

## 2022-04-14 DIAGNOSIS — I110 Hypertensive heart disease with heart failure: Secondary | ICD-10-CM

## 2022-04-14 DIAGNOSIS — N6019 Diffuse cystic mastopathy of unspecified breast: Secondary | ICD-10-CM | POA: Diagnosis not present

## 2022-04-14 DIAGNOSIS — F3341 Major depressive disorder, recurrent, in partial remission: Secondary | ICD-10-CM

## 2022-04-14 DIAGNOSIS — G4733 Obstructive sleep apnea (adult) (pediatric): Secondary | ICD-10-CM | POA: Diagnosis not present

## 2022-04-14 DIAGNOSIS — E785 Hyperlipidemia, unspecified: Secondary | ICD-10-CM | POA: Diagnosis not present

## 2022-04-14 DIAGNOSIS — E669 Obesity, unspecified: Secondary | ICD-10-CM | POA: Diagnosis not present

## 2022-04-14 DIAGNOSIS — K5900 Constipation, unspecified: Secondary | ICD-10-CM | POA: Diagnosis not present

## 2022-04-14 DIAGNOSIS — R69 Illness, unspecified: Secondary | ICD-10-CM | POA: Diagnosis not present

## 2022-04-14 DIAGNOSIS — G2581 Restless legs syndrome: Secondary | ICD-10-CM | POA: Diagnosis not present

## 2022-04-14 DIAGNOSIS — Z853 Personal history of malignant neoplasm of breast: Secondary | ICD-10-CM | POA: Diagnosis not present

## 2022-04-14 DIAGNOSIS — Z7984 Long term (current) use of oral hypoglycemic drugs: Secondary | ICD-10-CM | POA: Diagnosis not present

## 2022-04-14 DIAGNOSIS — Z96653 Presence of artificial knee joint, bilateral: Secondary | ICD-10-CM | POA: Diagnosis not present

## 2022-04-14 DIAGNOSIS — Z9011 Acquired absence of right breast and nipple: Secondary | ICD-10-CM | POA: Diagnosis not present

## 2022-04-14 DIAGNOSIS — I509 Heart failure, unspecified: Secondary | ICD-10-CM | POA: Diagnosis not present

## 2022-04-14 DIAGNOSIS — G20 Parkinson's disease: Secondary | ICD-10-CM | POA: Diagnosis not present

## 2022-04-14 DIAGNOSIS — M5030 Other cervical disc degeneration, unspecified cervical region: Secondary | ICD-10-CM | POA: Diagnosis not present

## 2022-04-14 DIAGNOSIS — M109 Gout, unspecified: Secondary | ICD-10-CM | POA: Diagnosis not present

## 2022-04-14 DIAGNOSIS — M5136 Other intervertebral disc degeneration, lumbar region: Secondary | ICD-10-CM | POA: Diagnosis not present

## 2022-04-14 DIAGNOSIS — Z9049 Acquired absence of other specified parts of digestive tract: Secondary | ICD-10-CM | POA: Diagnosis not present

## 2022-04-14 DIAGNOSIS — K219 Gastro-esophageal reflux disease without esophagitis: Secondary | ICD-10-CM | POA: Diagnosis not present

## 2022-04-14 DIAGNOSIS — I4891 Unspecified atrial fibrillation: Secondary | ICD-10-CM | POA: Diagnosis not present

## 2022-04-14 DIAGNOSIS — Z90711 Acquired absence of uterus with remaining cervical stump: Secondary | ICD-10-CM | POA: Diagnosis not present

## 2022-04-19 ENCOUNTER — Telehealth: Payer: Self-pay | Admitting: Primary Care

## 2022-04-19 ENCOUNTER — Telehealth: Payer: Self-pay

## 2022-04-19 DIAGNOSIS — F324 Major depressive disorder, single episode, in partial remission: Secondary | ICD-10-CM

## 2022-04-19 DIAGNOSIS — F419 Anxiety disorder, unspecified: Secondary | ICD-10-CM

## 2022-04-19 NOTE — Progress Notes (Signed)
Chronic Care Management Pharmacy Assistant   Name: Selena Bush  MRN: [id number] DOB: 12-Feb-1946  Reason for Encounter: CCM (Medication Adherence and Delivery Coordination)  Recent office visits:  None since last CCM contact  Recent consult visits:  04/11/22 Darylene Price, NP CHF No medication changes  Hospital visits:  None in previous 6 months  Medications: Outpatient Encounter Medications as of 04/19/2022  Medication Sig   acetaminophen (TYLENOL) 500 MG tablet Take by mouth.   carbidopa-levodopa (SINEMET IR) 25-100 MG tablet Take 2 tablets at 6 AM, 1 at 9AM, 1 tab at 12 PM,  2 at 3 PM, 1 at 6 PM and 2 at 9 PM.   Carbidopa-Levodopa ER (SINEMET CR) 25-100 MG tablet controlled release Take 1 tab at 9pm   Cholecalciferol (VITAMIN D-3) 25 MCG (1000 UT) CAPS Take 1 capsule by mouth daily.   Cyanocobalamin (VITAMIN B-12) 5000 MCG SUBL Place 1 each under the tongue daily.   DULoxetine (CYMBALTA) 60 MG capsule TAKE ONE CAPSULE BY MOUTH TWICE DAILY FOR anxiety AND depression   ezetimibe (ZETIA) 10 MG tablet TAKE ONE TABLET BY MOUTH EVERY MORNING   fluticasone (FLONASE) 50 MCG/ACT nasal spray Place 2 sprays into both nostrils daily as needed for rhinitis or allergies.   furosemide (LASIX) 20 MG tablet Take 1 tablet (20 mg total) by mouth every morning.   furosemide (LASIX) 20 MG tablet Take 1 tablet (20 mg total) by mouth as needed. If needed above your regular daily dose of furosemide   furosemide (LASIX) 20 MG tablet TAKE ONE TABLET BY MOUTH ONCE DAILY May take additional tablet daily if needed   gabapentin (NEURONTIN) 600 MG tablet TAKE ONE TABLET BY MOUTH EVERYDAY AT BEDTIME   hydrocortisone 2.5 % cream Apply to affected under breasts 1-2 times a day as directed and as needed for itch. Can also use on face as needed for itch   JARDIANCE 10 MG TABS tablet TAKE ONE TABLET BY MOUTH BEFORE BREAKFAST   ketoconazole (NIZORAL) 2 % cream Apply to affected areas under breast twice daily for  rash until healed. Can also use on face as needed for dermatitis   metoprolol tartrate (LOPRESSOR) 25 MG tablet TAKE THREE TABLETS BY MOUTH TWICE DAILY   nystatin cream (MYCOSTATIN) Apply 1 Application topically 2 (two) times daily.   oxybutynin (DITROPAN) 5 MG tablet Take 5 mg by mouth 2 (two) times daily.   sotalol (BETAPACE) 120 MG tablet TAKE ONE TABLET BY MOUTH TWICE DAILY   spironolactone (ALDACTONE) 25 MG tablet Take 1 tablet (25 mg total) by mouth daily.   tiZANidine (ZANAFLEX) 4 MG tablet TAKE 2 TABLET (4 MG TOTAL) BY MOUTH DAILY FOR MUSCLE SPASMS AT BEDTIME.   No facility-administered encounter medications on file as of 04/19/2022.   BP Readings from Last 3 Encounters:  04/11/22 116/64  03/16/22 110/68  02/08/22 106/65    Lab Results  Component Value Date   HGBA1C 5.8 (A) 06/22/2021    Recent OV, Consult or Hospital visit:  No medication changes indicated  Last adherence delivery date: 03/31/2022      Patient is due for next adherence delivery on: 05/02/2022  Multiple attempts made to reach patient. Unsuccessful outreach. Will refill based off of last adherence fill.   This delivery to include: Adherence Packaging  30 Days  Packs: Sotalol '120mg'$  1 tablet twice daily (1 breakfast and 1 evening meal) Tizanidine '4mg'$  2 tablets daily (2 bedtime) Ezetimibe '10mg'$  1 tablet daily (breakfast) Gabapentin 600 mg -  1 tablet daily (bedtime) Spironolactone '25mg'$  tablet 1 tablet (breakfast) Jardiance 10 mg 1 tablet daily (before breakfast) Duloxetine 60 mg 1 tablet twice a day (1 breakfast, 1 evening meal) Oxybutynin 5 mg 2 tablets daily (1 tablet breakfast 1 tablet evening meal) Metoprolol '25mg'$  3 tablets twice a day (3 breakfast and 3 evening meal) Furosemide 20 mg - 1 tablet daily (breakfast) Vitamin D - 1000 units - 1 tablet daily (breakfast)   VIAL medications: w/o safety caps Carbidopa-Levodopa 25-'100mg'$ Take 2 tablets at 6 AM, 1 at 9AM, 1 tab at 12 PM,  2 at 3 PM, 1 at 6 PM and 2  at 9 PM  May take extra tab as need.  Furosemide 20 mg - 1 tablet PRN above regular daily dose of Furosemide  Carbidopa-Levodopa ER 25-'100mg'$  - 1 tablet at 9:00 pm   Patient declined the following medications this month: Gabapentin '100mg'$  1 capsule three times a day (1 breakfast, 1 evening meal, 1 bedtime) May take 1 extra daily, PRN in vial. Patient has three bottles on hand.  Fluticasone Propionate 50 mcg Place 2 sprays into both nostrils daily. Patient has four bottles on hand.    Refills requested from providers include: Not complete Gabapentin 600 mg - 1 tablet daily (bedtime) Duloxetine 60 mg 1 tablet twice a day (1 breakfast, 1 evening meal)  Delivery scheduled for 05/02/2022. Unable to speak with patient to confirm date.  05/02/2022  Recent blood pressure readings are as follows: Patient does not check at home.    Annual wellness visit in last year? Yes 01/06/22 Most Recent BP reading: 116/64 on 04/11/2022  Cycle dispensing form sent to [facility], CPP for review.

## 2022-04-19 NOTE — Telephone Encounter (Signed)
Looks like patient is due for CPE/general follow up in November. Can we get her scheduled?

## 2022-04-20 NOTE — Telephone Encounter (Signed)
Called pt, no answer. Left a vm

## 2022-04-22 NOTE — Telephone Encounter (Signed)
Left message to return call to our office.  

## 2022-04-26 NOTE — Telephone Encounter (Signed)
Called pt, her caregiver answered the phone. Told caregiver about [place] scheduling her cpe, caregiver stated pt would no longer be seen by [facility]. Stated pt was now seeing some type of "Page Program". Care giver wasn't specific.

## 2022-04-28 NOTE — Telephone Encounter (Signed)
Another referral coordinator handled this order [facility] was accepting this patient but it looks like when they called to set up [facility] date the patient declined and requested her referral be switched over to [place].   Everything was sent over to [facility] per [facility]  Referral Notes Type Date User Summary Attachment  General 03/28/2022  7:50 AM Wynell Balloon I have sent a message to Judson Roch making her aware they want to use [facility], I have also sent a message to Marjory Lies asking if he can get this order scheduled. -  Note:   I have sent a message to Judson Roch making her aware they want to use [facility], I have also sent a message to Marjory Lies asking if he can get this order scheduled.   Danae Chen

## 2022-05-04 ENCOUNTER — Other Ambulatory Visit: Payer: Self-pay | Admitting: Vascular Surgery

## 2022-05-04 DIAGNOSIS — Z1231 Encounter for screening mammogram for malignant neoplasm of breast: Secondary | ICD-10-CM

## 2022-06-03 ENCOUNTER — Telehealth: Payer: Self-pay | Admitting: Cardiology

## 2022-06-03 NOTE — Telephone Encounter (Signed)
RN spoke to [place] NP, ( she works for [facility])  She states she need to get approval from Dr Stanford Breed , per [facility] pharmacist team.  Aware will defer to Dr Stanford Breed

## 2022-06-03 NOTE — Telephone Encounter (Signed)
Spoke with brittany, Aware of dr Jacalyn Lefevre recommendations.

## 2022-06-03 NOTE — Telephone Encounter (Signed)
Tanzania from [facility] called and wanted to know if the could give the patient Mirtazapine 7.5 mg for sleeping. Please call back

## 2022-06-04 ENCOUNTER — Other Ambulatory Visit: Payer: Self-pay

## 2022-06-04 ENCOUNTER — Emergency Department (HOSPITAL_COMMUNITY): Payer: Medicare (Managed Care)

## 2022-06-04 ENCOUNTER — Encounter (HOSPITAL_COMMUNITY): Payer: Self-pay | Admitting: Emergency Medicine

## 2022-06-04 ENCOUNTER — Observation Stay (HOSPITAL_COMMUNITY)
Admission: EM | Admit: 2022-06-04 | Discharge: 2022-06-08 | Disposition: A | Discharge status: Skilled Nursing Facility | Payer: Medicare (Managed Care) | Attending: Internal Medicine | Admitting: Internal Medicine

## 2022-06-04 DIAGNOSIS — M25552 Pain in left hip: Secondary | ICD-10-CM | POA: Diagnosis not present

## 2022-06-04 DIAGNOSIS — S2232XA Fracture of one rib, left side, initial encounter for closed fracture: Secondary | ICD-10-CM | POA: Diagnosis not present

## 2022-06-04 DIAGNOSIS — Z96653 Presence of artificial knee joint, bilateral: Secondary | ICD-10-CM | POA: Insufficient documentation

## 2022-06-04 DIAGNOSIS — S0990XA Unspecified injury of head, initial encounter: Secondary | ICD-10-CM | POA: Insufficient documentation

## 2022-06-04 DIAGNOSIS — I480 Paroxysmal atrial fibrillation: Secondary | ICD-10-CM | POA: Diagnosis present

## 2022-06-04 DIAGNOSIS — S2239XA Fracture of one rib, unspecified side, initial encounter for closed fracture: Secondary | ICD-10-CM | POA: Diagnosis present

## 2022-06-04 DIAGNOSIS — Z79899 Other long term (current) drug therapy: Secondary | ICD-10-CM | POA: Insufficient documentation

## 2022-06-04 DIAGNOSIS — N1831 Chronic kidney disease, stage 3a: Secondary | ICD-10-CM | POA: Diagnosis present

## 2022-06-04 DIAGNOSIS — M542 Cervicalgia: Secondary | ICD-10-CM | POA: Diagnosis present

## 2022-06-04 DIAGNOSIS — N1832 Chronic kidney disease, stage 3b: Secondary | ICD-10-CM | POA: Insufficient documentation

## 2022-06-04 DIAGNOSIS — M25512 Pain in left shoulder: Secondary | ICD-10-CM | POA: Insufficient documentation

## 2022-06-04 DIAGNOSIS — W010XXA Fall on same level from slipping, tripping and stumbling without subsequent striking against object, initial encounter: Secondary | ICD-10-CM | POA: Diagnosis not present

## 2022-06-04 DIAGNOSIS — Z7951 Long term (current) use of inhaled steroids: Secondary | ICD-10-CM | POA: Diagnosis not present

## 2022-06-04 DIAGNOSIS — I5032 Chronic diastolic (congestive) heart failure: Secondary | ICD-10-CM | POA: Diagnosis present

## 2022-06-04 DIAGNOSIS — Y92009 Unspecified place in unspecified non-institutional (private) residence as the place of occurrence of the external cause: Secondary | ICD-10-CM | POA: Diagnosis not present

## 2022-06-04 DIAGNOSIS — L8990 Pressure ulcer of unspecified site, unspecified stage: Secondary | ICD-10-CM | POA: Insufficient documentation

## 2022-06-04 DIAGNOSIS — G20A1 Parkinson's disease without dyskinesia, without mention of fluctuations: Secondary | ICD-10-CM | POA: Diagnosis not present

## 2022-06-04 DIAGNOSIS — G20C Parkinsonism, unspecified: Secondary | ICD-10-CM | POA: Diagnosis present

## 2022-06-04 DIAGNOSIS — Y9301 Activity, walking, marching and hiking: Secondary | ICD-10-CM | POA: Insufficient documentation

## 2022-06-04 DIAGNOSIS — I130 Hypertensive heart and chronic kidney disease with heart failure and stage 1 through stage 4 chronic kidney disease, or unspecified chronic kidney disease: Secondary | ICD-10-CM | POA: Diagnosis not present

## 2022-06-04 LAB — CBC WITH DIFFERENTIAL/PLATELET
Abs Immature Granulocytes: 0.05 10*3/uL (ref 0.00–0.07)
Basophils Absolute: 0 10*3/uL (ref 0.0–0.1)
Basophils Relative: 0 %
Eosinophils Absolute: 0 10*3/uL (ref 0.0–0.5)
Eosinophils Relative: 0 %
HCT: 43.5 % (ref 36.0–46.0)
Hemoglobin: 14.3 g/dL (ref 12.0–15.0)
Immature Granulocytes: 1 %
Lymphocytes Relative: 15 %
Lymphs Abs: 1.6 10*3/uL (ref 0.7–4.0)
MCH: 32.5 pg (ref 26.0–34.0)
MCHC: 32.9 g/dL (ref 30.0–36.0)
MCV: 98.9 fL (ref 80.0–100.0)
Monocytes Absolute: 0.8 10*3/uL (ref 0.1–1.0)
Monocytes Relative: 7 %
Neutro Abs: 7.9 10*3/uL — ABNORMAL HIGH (ref 1.7–7.7)
Neutrophils Relative %: 77 %
Platelets: 202 10*3/uL (ref 150–400)
RBC: 4.4 MIL/uL (ref 3.87–5.11)
RDW: 12.9 % (ref 11.5–15.5)
WBC: 10.4 10*3/uL (ref 4.0–10.5)
nRBC: 0 % (ref 0.0–0.2)

## 2022-06-04 LAB — BASIC METABOLIC PANEL
Anion gap: 9 (ref 5–15)
BUN: 17 mg/dL (ref 8–23)
CO2: 29 mmol/L (ref 22–32)
Calcium: 10 mg/dL (ref 8.9–10.3)
Chloride: 100 mmol/L (ref 98–111)
Creatinine, Ser: 1.02 mg/dL — ABNORMAL HIGH (ref 0.44–1.00)
GFR, Estimated: 58 mL/min — ABNORMAL LOW (ref >60)
Glucose, Bld: 102 mg/dL — ABNORMAL HIGH (ref 70–99)
Potassium: 3.9 mmol/L (ref 3.5–5.1)
Sodium: 138 mmol/L (ref 135–145)

## 2022-06-04 MED ORDER — CARBIDOPA-LEVODOPA 25-100 MG PO TABS
2.0000 | ORAL_TABLET | ORAL | Status: DC
  Administered 2022-06-05 – 2022-06-08 (×10): 2 via ORAL
  Filled 2022-06-04 (×13): qty 2

## 2022-06-04 MED ORDER — LIDOCAINE 5 % EX PTCH
1.0000 | MEDICATED_PATCH | CUTANEOUS | Status: DC
  Administered 2022-06-05 – 2022-06-06 (×3): 1 via TRANSDERMAL
  Filled 2022-06-04 (×4): qty 1

## 2022-06-04 MED ORDER — ACETAMINOPHEN 325 MG PO TABS
650.0000 mg | ORAL_TABLET | Freq: Once | ORAL | Status: AC
  Administered 2022-06-04: 650 mg via ORAL
  Filled 2022-06-04: qty 2

## 2022-06-04 MED ORDER — ENOXAPARIN SODIUM 40 MG/0.4ML IJ SOSY
40.0000 mg | PREFILLED_SYRINGE | INTRAMUSCULAR | Status: DC
  Administered 2022-06-05 – 2022-06-08 (×4): 40 mg via SUBCUTANEOUS
  Filled 2022-06-04 (×4): qty 0.4

## 2022-06-04 MED ORDER — TIZANIDINE HCL 2 MG PO TABS
4.0000 mg | ORAL_TABLET | Freq: Every evening | ORAL | Status: DC | PRN
  Administered 2022-06-05: 4 mg via ORAL
  Filled 2022-06-04: qty 2

## 2022-06-04 MED ORDER — GABAPENTIN 300 MG PO CAPS
600.0000 mg | ORAL_CAPSULE | Freq: Every day | ORAL | Status: DC
  Administered 2022-06-05 – 2022-06-07 (×4): 600 mg via ORAL
  Filled 2022-06-04 (×4): qty 2

## 2022-06-04 MED ORDER — OXYBUTYNIN CHLORIDE 5 MG PO TABS
5.0000 mg | ORAL_TABLET | Freq: Two times a day (BID) | ORAL | Status: DC
  Administered 2022-06-05 – 2022-06-08 (×8): 5 mg via ORAL
  Filled 2022-06-04 (×9): qty 1

## 2022-06-04 MED ORDER — SOTALOL HCL 120 MG PO TABS
120.0000 mg | ORAL_TABLET | Freq: Two times a day (BID) | ORAL | Status: DC
  Administered 2022-06-05 – 2022-06-08 (×7): 120 mg via ORAL
  Filled 2022-06-04 (×8): qty 1

## 2022-06-04 MED ORDER — CARBIDOPA-LEVODOPA 25-100 MG PO TABS: 1.0000 | ORAL_TABLET | ORAL | Status: DC

## 2022-06-04 MED ORDER — CARBIDOPA-LEVODOPA 25-100 MG PO TABS
1.0000 | ORAL_TABLET | ORAL | Status: DC
  Administered 2022-06-05 – 2022-06-08 (×11): 1 via ORAL
  Filled 2022-06-04 (×12): qty 1

## 2022-06-04 MED ORDER — DULOXETINE HCL 60 MG PO CPEP
60.0000 mg | ORAL_CAPSULE | Freq: Two times a day (BID) | ORAL | Status: DC
  Administered 2022-06-05 – 2022-06-08 (×8): 60 mg via ORAL
  Filled 2022-06-04 (×2): qty 1
  Filled 2022-06-04 (×2): qty 2
  Filled 2022-06-04 (×4): qty 1

## 2022-06-04 MED ORDER — POLYETHYLENE GLYCOL 3350 17 G PO PACK: 17.0000 g | PACK | Freq: Every day | ORAL | Status: DC | PRN

## 2022-06-04 MED ORDER — FENTANYL CITRATE PF 50 MCG/ML IJ SOSY: 25.0000 ug | PREFILLED_SYRINGE | INTRAMUSCULAR | Status: DC | PRN

## 2022-06-04 MED ORDER — TIZANIDINE HCL 4 MG PO TABS
4.0000 mg | ORAL_TABLET | Freq: Once | ORAL | Status: AC
  Administered 2022-06-04: 4 mg via ORAL
  Filled 2022-06-04: qty 1

## 2022-06-04 MED ORDER — ACETAMINOPHEN 325 MG PO TABS
650.0000 mg | ORAL_TABLET | Freq: Four times a day (QID) | ORAL | Status: DC | PRN
  Administered 2022-06-05 – 2022-06-07 (×4): 650 mg via ORAL
  Filled 2022-06-04 (×4): qty 2

## 2022-06-04 MED ORDER — METOPROLOL TARTRATE 50 MG PO TABS
75.0000 mg | ORAL_TABLET | Freq: Two times a day (BID) | ORAL | Status: DC
  Administered 2022-06-05 – 2022-06-08 (×7): 75 mg via ORAL
  Filled 2022-06-04: qty 1
  Filled 2022-06-04: qty 3
  Filled 2022-06-04 (×5): qty 1

## 2022-06-04 MED ORDER — SPIRONOLACTONE 25 MG PO TABS
25.0000 mg | ORAL_TABLET | Freq: Every day | ORAL | Status: DC
  Administered 2022-06-05 – 2022-06-08 (×3): 25 mg via ORAL
  Filled 2022-06-04 (×5): qty 1

## 2022-06-04 MED ORDER — OXYCODONE HCL 5 MG PO TABS
5.0000 mg | ORAL_TABLET | ORAL | Status: DC | PRN
  Administered 2022-06-05: 5 mg via ORAL
  Filled 2022-06-04 (×2): qty 1

## 2022-06-04 NOTE — ED Notes (Signed)
Patient had difficulty ambulating due to pain

## 2022-06-04 NOTE — ED Provider Triage Note (Signed)
Emergency Medicine Provider Triage Evaluation Note  Selena Bush , a 76 y.o. female  was evaluated in triage.  Pt complains of fall.  Patient states that she was walking and slipped/tripped over something on the ground and fell backwards.  She is complaining of neck pain, left thoracic back pain, left hip pain.  She was helped up by 3 EMS providers.  She reports falling this morning.  Denies feelings of dizziness, lightheadedness, chest pain, shortness of breath, abdominal pain, nausea, vomiting, trauma to head, loss of consciousness, current blood thinner use..  Review of Systems  Positive: See above Negative:   Physical Exam  BP 109/63 (BP Location: Right Arm)   Pulse (!) 56   Temp 97.8 F (36.6 C) (Oral)   Resp 20   SpO2 95%  Gen:   Awake, no distress   Resp:  Normal effort  MSK:   Moves extremities without difficulty  Other:    Medical Decision Making  Medically screening exam initiated at 12:09 PM.  Appropriate orders placed.  Selena Bush was informed that the remainder of the evaluation will be completed by another provider, this initial triage assessment does not replace that evaluation, and the importance of remaining in the ED until their evaluation is complete.     Wilnette Kales, [place] 06/04/22 1210

## 2022-06-04 NOTE — H&P (Signed)
History and Physical    Selena Bush JXB:[id number] DOB: [birth year] DOA: 06/04/2022  PCP: Pcp, No   Patient coming from: Home   Chief Complaint: Fall with left back, hip, and shoulder pain   HPI: Selena Bush is a pleasant 76 y.o. female with medical history significant for Parkinson disease, chronic diastolic CHF, hypertension, and paroxysmal atrial fibrillation not anticoagulated due to falls and history of hemorrhagic pericardial effusion, now presenting to the emergency department with pain in her left hip, left back, and shoulder after a fall at home.  Patient was in her usual state today and using her walker when she tripped and fell.  She is not sure if she hit her head but denies losing consciousness.  She was unable to get up on her own and has been complaining of severe pain to her left mid back, neck, left hip, and shoulder.  ED Course: Upon arrival to the ED, patient is found to be afebrile and saturating 90s on room air with stable blood pressure.  Chemistry panel notable for creatinine 1.02.  CBC unremarkable.  Plain films of the pelvis, left hip, and left shoulder are negative for acute fracture.  Head CT is negative for acute intracranial abnormality.  Cervical spine CT negative for recent fracture.  No lumbar or thoracic spine fracture noted on CT.  CT chest reveals a minimally displaced fracture of the posterior left eighth rib.  Patient was given aspirin and Zanaflex in the ED.  Review of Systems:  All other systems reviewed and apart from HPI, are negative.  Past Medical History:  Diagnosis Date   Anxiety    Atrial fibrillation ([place])    Cat bite of right hand 03/25/2019   Chronic diastolic CHF (congestive heart failure) ([place])    a. echo 09/2014: EF 13-08%, diastolic dysfunction, mild LVH, nl RV size & systolic function, mildly dilated LA (4.3 cm), mild MR/TR, mildly elevated PASP 36.7 mm Hg   DDD (degenerative disc disease), cervical    DDD (degenerative disc  disease), lumbar    Depression    Diffuse cystic mastopathy 2014   Dyspnea    Dysrhythmia    GERD (gastroesophageal reflux disease)    Gout    Headache    rare   HLD (hyperlipidemia)    a. statin intolerant 2/2 myalgias   Hx of dysplastic nevus 12/25/2018   L medial ankle   Hypercholesterolemia    Hypertension    Malignant neoplasm of upper-outer quadrant of female breast ([place]) 10/2012   Papillary DCIS, sentinel node negative. DR/PR positive. PARTIAL RIGHT MASTECTOMY FOR BREAST CANCER--HAD RADIATION - NO CHEMO --DR. CRYSTAL [place] ONCOLOGIST   Obesity    OSA on CPAP    Osteoarthritis of both knees    a. s/p right TKA 04/2013 & left TKA 09/2014   Otitis externa    Parkinson disease    Personal history of radiation therapy 2015   RIGHT breast-mammosite per pt   Pneumonia of both lungs due to infectious organism 01/08/2021   Sleep difficulties    LUNESTA HAS HELPED   Vaginal cyst     Past Surgical History:  Procedure Laterality Date   ABDOMINAL HYSTERECTOMY  1992   DUB; fibroids; endometriosis.  One remaining ovary.     BREAST LUMPECTOMY Right 2015   Papillary DCIS, sentinel node negative. DR/PR positive. PARTIAL RIGHT MASTECTOMY FOR BREAST CANCER--HAD RADIATION - NO CHEMO --DR. CRYSTAL [place] ONCOLOGIST   BREAST SURGERY Right 10/2012   Wide excision,APB RT 10  mm papillary DCIS, ER/PR positive. Sentinel node negative. Partial breast radiation.   CATARACT EXTRACTION, BILATERAL  02/13/2016   [facility].   CHOLECYSTECTOMY  1994   COLONOSCOPY  2015   1 benign polyp-every 5 years/ Dr Candace Cruise   COLONOSCOPY WITH PROPOFOL N/A 02/10/2021   Procedure: COLONOSCOPY WITH PROPOFOL;  Surgeon: Robert Bellow, MD;  Location: [facility] ENDOSCOPY;  Service: Endoscopy;  Laterality: N/A;   ERCP  1995   JOINT REPLACEMENT Right 04/2013   knee   PERICARDIOCENTESIS N/A 11/13/2017   Procedure: PERICARDIOCENTESIS;  Surgeon: Nelva Bush, MD;  Location: [facility];  Service: Cardiovascular;   Laterality: N/A;   TOTAL KNEE ARTHROPLASTY Right 04/16/2013   Procedure: RIGHT TOTAL KNEE ARTHROPLASTY;  Surgeon: Mauri Pole, MD;  Location: [facility] ORS;  Service: Orthopedics;  Laterality: Right;   TOTAL KNEE ARTHROPLASTY Left 09/15/2014   Procedure: LEFT TOTAL KNEE ARTHROPLASTY;  Surgeon: Mauri Pole, MD;  Location: [facility] ORS;  Service: Orthopedics;  Laterality: Left;   TUBAL LIGATION  1979   UPPER GI ENDOSCOPY      Social History:   reports that she has never smoked. She has never used smokeless tobacco. She reports that she does not drink alcohol and does not use drugs.  Allergies  Allergen Reactions   Penicillins Anaphylaxis    anaphylaxis  Has patient had a PCN reaction causing immediate rash, facial/tongue/throat swelling, SOB or lightheadedness with hypotension: Yes Has patient had a PCN reaction causing severe rash involving mucus membranes or skin necrosis: No Has patient had a PCN reaction that required hospitalization: No Has patient had a PCN reaction occurring within the last 10 years: No If all of the above answers are "NO", then may proceed with Cephalosporin use.    Sulfa Antibiotics Anaphylaxis   Codeine Other (See Comments) and Nausea And Vomiting    Gi problems    Statins Other (See Comments)    Leg pains   Aspirin Other (See Comments)    "burned my stomach intensely" Abdominal pain and burning    Family History  Problem Relation Age of Onset   Colon cancer Mother    Cancer Mother 85       colon cancer   Melanoma Father    Cancer Father 33       melanoma   Colon cancer Maternal Grandfather    Breast cancer Maternal Grandfather    Melanoma Sister    Melanoma Sister      Prior to Admission medications   Medication Sig Start Date End Date Taking? Authorizing Provider  acetaminophen (TYLENOL) 500 MG tablet Take by mouth.    [provider]  carbidopa-levodopa (SINEMET IR) 25-100 MG tablet Take 2 tablets at 6 AM, 1 at 9AM, 1 tab at 12 PM,  2  at 3 PM, 1 at 6 PM and 2 at 9 PM. 11/25/21   Alisa Graff, FNP  Carbidopa-Levodopa ER (SINEMET CR) 25-100 MG tablet controlled release Take 1 tab at 9pm 10/19/21   [provider]  Cholecalciferol (VITAMIN D-3) 25 MCG (1000 UT) CAPS Take 1 capsule by mouth daily.    [provider]  Cyanocobalamin (VITAMIN B-12) 5000 MCG SUBL Place 1 each under the tongue daily.    [provider]  DULoxetine (CYMBALTA) 60 MG capsule TAKE ONE CAPSULE BY MOUTH TWICE DAILY FOR anxiety AND depression 04/19/22   Pleas Koch, NP  ezetimibe (ZETIA) 10 MG tablet TAKE ONE TABLET BY MOUTH EVERY MORNING 06/23/21   Crenshaw,  Denice Bors, MD  fluticasone (FLONASE) 50 MCG/ACT nasal spray Place 2 sprays into both nostrils daily as needed for rhinitis or allergies. 07/21/21   Pleas Koch, NP  furosemide (LASIX) 20 MG tablet Take 1 tablet (20 mg total) by mouth every morning. 06/07/21   Lelon Perla, MD  furosemide (LASIX) 20 MG tablet Take 1 tablet (20 mg total) by mouth as needed. If needed above your regular daily dose of furosemide 10/12/21   Alisa Graff, FNP  furosemide (LASIX) 20 MG tablet TAKE ONE TABLET BY MOUTH ONCE DAILY May take additional tablet daily if needed 02/17/22   Alisa Graff, FNP  gabapentin (NEURONTIN) 600 MG tablet TAKE ONE TABLET BY MOUTH EVERYDAY AT BEDTIME 01/18/22   [provider]  hydrocortisone 2.5 % cream Apply to affected under breasts 1-2 times a day as directed and as needed for itch. Can also use on face as needed for itch 01/05/21   Brendolyn Patty, MD  JARDIANCE 10 MG TABS tablet TAKE ONE TABLET BY MOUTH BEFORE BREAKFAST 12/20/21   Alisa Graff, FNP  ketoconazole (NIZORAL) 2 % cream Apply to affected areas under breast twice daily for rash until healed. Can also use on face as needed for dermatitis 01/05/21   Brendolyn Patty, MD  metoprolol tartrate (LOPRESSOR) 25 MG tablet TAKE THREE TABLETS BY MOUTH TWICE DAILY 11/19/21   Lelon Perla, MD  nystatin  cream (MYCOSTATIN) Apply 1 Application topically 2 (two) times daily. 03/16/22   Pleas Koch, NP  oxybutynin (DITROPAN) 5 MG tablet Take 5 mg by mouth 2 (two) times daily. 11/01/21   [provider]  sotalol (BETAPACE) 120 MG tablet TAKE ONE TABLET BY MOUTH TWICE DAILY 05/26/21   Lelon Perla, MD  spironolactone (ALDACTONE) 25 MG tablet Take 1 tablet (25 mg total) by mouth daily. 02/18/22   Lelon Perla, MD  tiZANidine (ZANAFLEX) 4 MG tablet TAKE 2 TABLET (4 MG TOTAL) BY MOUTH DAILY FOR MUSCLE SPASMS AT BEDTIME. 06/22/21   Pleas Koch, NP    Physical Exam: Vitals:   06/04/22 2010 06/04/22 2030 06/04/22 2115 06/04/22 2130  BP:  117/68 139/84   Pulse: (!) 51 (!) 51 61 61  Resp: '14 14 20 17  '$ Temp: 98 F (36.7 C)     TempSrc:      SpO2: 96% 100% 100% 97%    Constitutional: NAD, calm  Eyes: PERTLA, lids and conjunctivae normal ENMT: Mucous membranes are moist. Posterior pharynx clear of any exudate or lesions.   Neck: supple, no masses  Respiratory:  no wheezing, no crackles. No accessory muscle use.  Cardiovascular: S1 & S2 heard, regular rate and rhythm. No JVD. Abdomen: No distension, no tenderness, soft. Bowel sounds active.  Musculoskeletal: no clubbing / cyanosis. No joint deformity upper and lower extremities.   Skin: no significant rashes, lesions, ulcers. Warm, dry, well-perfused. Neurologic: CN 2-12 grossly intact. Moving all extremities. Alert and oriented.  Psychiatric: Pleasant. Cooperative.    Labs and Imaging on Admission: I have personally reviewed following labs and imaging studies  CBC: Recent Labs  Lab 06/04/22 1949  WBC 10.4  NEUTROABS 7.9*  HGB 14.3  HCT 43.5  MCV 98.9  PLT 875   Basic Metabolic Panel: Recent Labs  Lab 06/04/22 1949  NA 138  K 3.9  CL 100  CO2 29  GLUCOSE 102*  BUN 17  CREATININE 1.02*  CALCIUM 10.0   GFR: CrCl cannot be calculated (Unknown ideal weight.).  Liver Function Tests: No results for  input(s): "AST", "ALT", "ALKPHOS", "BILITOT", "PROT", "ALBUMIN" in the last 168 hours. No results for input(s): "LIPASE", "AMYLASE" in the last 168 hours. No results for input(s): "AMMONIA" in the last 168 hours. Coagulation Profile: No results for input(s): "INR", "PROTIME" in the last 168 hours. Cardiac Enzymes: No results for input(s): "CKTOTAL", "CKMB", "CKMBINDEX", "TROPONINI" in the last 168 hours. BNP (last 3 results) No results for input(s): "PROBNP" in the last 8760 hours. HbA1C: No results for input(s): "HGBA1C" in the last 72 hours. CBG: No results for input(s): "GLUCAP" in the last 168 hours. Lipid Profile: No results for input(s): "CHOL", "HDL", "LDLCALC", "TRIG", "CHOLHDL", "LDLDIRECT" in the last 72 hours. Thyroid Function Tests: No results for input(s): "TSH", "T4TOTAL", "FREET4", "T3FREE", "THYROIDAB" in the last 72 hours. Anemia Panel: No results for input(s): "VITAMINB12", "FOLATE", "FERRITIN", "TIBC", "IRON", "RETICCTPCT" in the last 72 hours. Urine analysis:    Component Value Date/Time   COLORURINE STRAW (A) 11/04/2017 1949   APPEARANCEUR CLEAR 11/04/2017 1949   APPEARANCEUR Clear 09/20/2014 2055   LABSPEC 1.006 11/04/2017 1949   LABSPEC 1.008 09/20/2014 2055   PHURINE 7.0 11/04/2017 1949   GLUCOSEU NEGATIVE 11/04/2017 1949   GLUCOSEU Negative 09/20/2014 2055   HGBUR NEGATIVE 11/04/2017 1949   BILIRUBINUR negative 11/24/2021 0921   BILIRUBINUR Negative 09/20/2014 2055   KETONESUR NEGATIVE 11/04/2017 1949   PROTEINUR Negative 11/24/2021 0921   PROTEINUR NEGATIVE 11/04/2017 1949   UROBILINOGEN 0.2 11/24/2021 0921   UROBILINOGEN 0.2 09/09/2014 1429   NITRITE negative 11/24/2021 0921   NITRITE NEGATIVE 11/04/2017 1949   LEUKOCYTESUR Large (3+) (A) 11/24/2021 0921   LEUKOCYTESUR Negative 09/20/2014 2055   Sepsis Labs: '@LABRCNTIP'$ (procalcitonin:4,lacticidven:4) )No results found for this or any previous visit (from the past 240 hour(s)).   Radiological  Exams on Admission: DG Hip Unilat With Pelvis 2-3 Views Left  Result Date: 06/04/2022 CLINICAL DATA:  Pain post fall EXAM: DG HIP (WITH OR WITHOUT PELVIS) 2-3V LEFT COMPARISON:  None Available. FINDINGS: There is no evidence of hip fracture or dislocation. Lower lumbar facet DJD. There is no evidence of arthropathy or other focal bone abnormality. IMPRESSION: Negative. Electronically Signed   By: Lucrezia Europe M.D.   On: 06/04/2022 13:34   DG Shoulder Left  Result Date: 06/04/2022 CLINICAL DATA:  Pain post fall EXAM: LEFT SHOULDER - 2+ VIEW COMPARISON:  None available FINDINGS: Advanced glenohumeral DJD. No fracture or dislocation. Soft tissues are unremarkable. IMPRESSION: Advanced glenohumeral DJD. No acute findings. Electronically Signed   By: Lucrezia Europe M.D.   On: 06/04/2022 13:33   CT Lumbar Spine Wo Contrast  Result Date: 06/04/2022 CLINICAL DATA:  Pain post fall EXAM: CT THORACIC SPINE WITHOUT CONTRAST CT LUMBAR SPINE WITHOUT CONTRAST TECHNIQUE: Multidetector CT images of the thoracic and lumbar spine were obtained using the standard protocol without intravenous contrast. RADIATION DOSE REDUCTION: This exam was performed according to the departmental dose-optimization program which includes automated exposure control, adjustment of the mA and/or kV according to patient size and/or use of iterative reconstruction technique. COMPARISON:  CT 09/20/2014 FINDINGS: Alignment: Normal Vertebrae: 12 rib-bearing thoracic segments assigned T1-T12, 5 non-rib-bearing lumbar segments L1-L5. Anterior endplate spurring at all levels T2-L5. Paraspinal and other soft tissues: No paraspinal hematoma. Minimally displaced fracture posterior aspect left eighth rib incidentally noted. Disc levels: C5-C7: Degenerative disc disease, incompletely. T8-T11: Mild narrowing of interspaces T11-L2: Mild vacuum phenomenon in the interspaces. L2-S1: Bilateral facet DJD. IMPRESSION: 1. Negative for thoracic or lumbar fracture. 2.  Minimally displaced fracture posterior left eighth rib. 3. Multilevel degenerative changes as above. Electronically Signed   By: Lucrezia Europe M.D.   On: 06/04/2022 13:32   CT T-SPINE NO CHARGE  Result Date: 06/04/2022 CLINICAL DATA:  Pain post fall EXAM: CT THORACIC SPINE WITHOUT CONTRAST CT LUMBAR SPINE WITHOUT CONTRAST TECHNIQUE: Multidetector CT images of the thoracic and lumbar spine were obtained using the standard protocol without intravenous contrast. RADIATION DOSE REDUCTION: This exam was performed according to the departmental dose-optimization program which includes automated exposure control, adjustment of the mA and/or kV according to patient size and/or use of iterative reconstruction technique. COMPARISON:  CT 09/20/2014 FINDINGS: Alignment: Normal Vertebrae: 12 rib-bearing thoracic segments assigned T1-T12, 5 non-rib-bearing lumbar segments L1-L5. Anterior endplate spurring at all levels T2-L5. Paraspinal and other soft tissues: No paraspinal hematoma. Minimally displaced fracture posterior aspect left eighth rib incidentally noted. Disc levels: C5-C7: Degenerative disc disease, incompletely. T8-T11: Mild narrowing of interspaces T11-L2: Mild vacuum phenomenon in the interspaces. L2-S1: Bilateral facet DJD. IMPRESSION: 1. Negative for thoracic or lumbar fracture. 2. Minimally displaced fracture posterior left eighth rib. 3. Multilevel degenerative changes as above. Electronically Signed   By: Lucrezia Europe M.D.   On: 06/04/2022 13:32   CT Chest Wo Contrast  Result Date: 06/04/2022 CLINICAL DATA:  Chest trauma, blunt, post fall EXAM: CT CHEST WITHOUT CONTRAST TECHNIQUE: Multidetector CT imaging of the chest was performed following the standard protocol without IV contrast. RADIATION DOSE REDUCTION: This exam was performed according to the departmental dose-optimization program which includes automated exposure control, adjustment of the mA and/or kV according to patient size and/or use of  iterative reconstruction technique. COMPARISON:  09/20/2014 FINDINGS: Cardiovascular: Heart size normal. No pericardial effusion. Coronary calcifications. Atheromatous thoracic aorta. Mediastinum/Nodes: No mediastinal hematoma, mass or adenopathy.  Or Lungs/Pleura: No pleural effusion. No pneumothorax. Dependent subsegmental atelectasis at the lung bases. Linear platelike atelectasis in the right lower lobe. 1.7 cm thin-walled bleb in the superior segment right lower lobe. 5 mm pleural-based nodule, superior segment right lower lobe (Im63,Se5) . Upper Abdomen: No acute findings. Musculoskeletal: Left shoulder DJD. Anterior vertebral endplate spurring at multiple levels in the mid and lower thoracic spine. Multiple old right rib fracture deformities. Acute minimally displaced fracture, posterior aspect left eighth rib (Im25,Se3). IMPRESSION: 1. Acute posterior left  eighth rib fracture. No pneumothorax. 2. Coronary and aortic Atherosclerosis (ICD10-170.0). 3. 5 mm right solid pulmonary nodule within the lower lobe. Per Fleischner Society Guidelines, no routine follow-up imaging is recommended. These guidelines do not apply to immunocompromised patients and patients with cancer. Follow up in patients with significant comorbidities as clinically warranted. Reference: Radiology. 2017; 284(1):228-43. Electronically Signed   By: Lucrezia Europe M.D.   On: 06/04/2022 13:22   CT Cervical Spine Wo Contrast  Result Date: 06/04/2022 CLINICAL DATA:  Trauma, fall EXAM: CT CERVICAL SPINE WITHOUT CONTRAST TECHNIQUE: Multidetector CT imaging of the cervical spine was performed without intravenous contrast. Multiplanar CT image reconstructions were also generated. RADIATION DOSE REDUCTION: This exam was performed according to the departmental dose-optimization program which includes automated exposure control, adjustment of the mA and/or kV according to patient size and/or use of iterative reconstruction technique. COMPARISON:   02/02/2020 FINDINGS: Alignment: Alignment of posterior margins of vertebral bodies is within normal limits. Skull base and vertebrae: No recent fracture is seen. Soft tissues and spinal canal: There is extrinsic pressure over the ventral margin of thecal sac caused by posterior bony spurs and bulging of annulus, more so at C5-C6  level with mild spinal stenosis. Disc levels: There is encroachment of neural foramina from C2-C7 levels, more severe at C5-C6 and C6-C7 levels. Upper chest: Unremarkable. Other: There is mucosal thickening in right maxillary sinus. IMPRESSION: No recent fracture is seen in cervical spine. Cervical spondylosis with encroachment of neural foramina at multiple levels, more so at C5-C6 and C6-C7 levels. Chronic sinusitis. Electronically Signed   By: Elmer Picker M.D.   On: 06/04/2022 13:09   CT Head Wo Contrast  Result Date: 06/04/2022 CLINICAL DATA:  Trauma, fall EXAM: CT HEAD WITHOUT CONTRAST TECHNIQUE: Contiguous axial images were obtained from the base of the skull through the vertex without intravenous contrast. RADIATION DOSE REDUCTION: This exam was performed according to the departmental dose-optimization program which includes automated exposure control, adjustment of the mA and/or kV according to patient size and/or use of iterative reconstruction technique. COMPARISON:  02/02/2020 FINDINGS: Brain: Ventricles are not dilated. There is no shift of midline structures. Ventricles are not dilated. There is decreased density in basal ganglia, periventricular white matter and subcortical white matter. Cortical sulci are slightly prominent. Vascular: Scattered arterial calcifications are seen. Skull: No fracture is seen. Sinuses/Orbits: There is opacification of right side of sphenoid sinus. There is mucosal thickening in ethmoid sinus. Other: None. IMPRESSION: No acute intracranial findings are seen in noncontrast CT brain. Atrophy. Small-vessel disease. Chronic sinusitis.  Electronically Signed   By: Elmer Picker M.D.   On: 06/04/2022 13:05     Assessment/Plan   1. Rib fracture  - Presents with pain after a mechanical fall and found to have minimally-displaced posterior 8th rib fx on left  - She is now unable to able to ambulate d/t pain  - Plan for pain-control and incentive spirometry   2. Debility  - Patient has progressive debility and family believes she may need to go to rehab facility  - Consult PT   3. Parkinson disease   - Continue Sinemet   4. Chronic diastolic CHF - Appears compensated  - Continue diuretics, monitor volume status    5. PAF  - Not anticoagulated d/t falls and hx of hemorrhagic pericardial effusion  - Continue sotalol   6. CKD 3b  - SCr is 1.02 on admission, appears to be her baseline  - Renally-dose medications, monitor     DVT prophylaxis: Lovenox  Code Status: Full  Level of Care: Level of care: Med-Surg Family Communication: caregiver updated from ED   Disposition Plan:  Patient is from: home  Anticipated d/c is to: TBD Anticipated d/c date is: 10/22 or 06/06/22  Patient currently: pending pain-control, PT eval  Consults called: none  Admission status: Observation     Vianne Bulls, MD [facility]  06/04/2022, 10:03 PM

## 2022-06-04 NOTE — ED Provider Notes (Signed)
[facility] EMERGENCY DEPARTMENT Provider Note   CSN: [id number] Arrival date & time: 06/04/22  1131     History  Chief Complaint  Patient presents with   Lytle Michaels    NIKO JAKEL is a 76 y.o. female.  HPI     76 year old female comes in with chief complaint of fall. Patient has history of CHF, paroxysmal A-fib, Parkinson's disease.  She had a mechanical fall earlier today.  She is normally frail because of her Parkinson's and and requires walker to get around.  She had a mechanical fall earlier today, and she is complaining of severe pain to the back, neck and her hip area.  Patient is not on any blood thinners.  She denies loss of consciousness.  Caregiver at the bedside.  Indicates that patient was unable to get up after the fall and they are to call EMS.  Home Medications Prior to Admission medications   Medication Sig Start Date End Date Taking? Authorizing Provider  acetaminophen (TYLENOL) 500 MG tablet Take by mouth.    [provider]  carbidopa-levodopa (SINEMET IR) 25-100 MG tablet Take 2 tablets at 6 AM, 1 at 9AM, 1 tab at 12 PM,  2 at 3 PM, 1 at 6 PM and 2 at 9 PM. 11/25/21   Alisa Graff, FNP  Carbidopa-Levodopa ER (SINEMET CR) 25-100 MG tablet controlled release Take 1 tab at 9pm 10/19/21   [provider]  Cholecalciferol (VITAMIN D-3) 25 MCG (1000 UT) CAPS Take 1 capsule by mouth daily.    [provider]  Cyanocobalamin (VITAMIN B-12) 5000 MCG SUBL Place 1 each under the tongue daily.    [provider]  DULoxetine (CYMBALTA) 60 MG capsule TAKE ONE CAPSULE BY MOUTH TWICE DAILY FOR anxiety AND depression 04/19/22   Pleas Koch, NP  ezetimibe (ZETIA) 10 MG tablet TAKE ONE TABLET BY MOUTH EVERY MORNING 06/23/21   Lelon Perla, MD  fluticasone (FLONASE) 50 MCG/ACT nasal spray Place 2 sprays into both nostrils daily as needed for rhinitis or allergies. 07/21/21   Pleas Koch, NP  furosemide (LASIX)  20 MG tablet Take 1 tablet (20 mg total) by mouth every morning. 06/07/21   Lelon Perla, MD  furosemide (LASIX) 20 MG tablet Take 1 tablet (20 mg total) by mouth as needed. If needed above your regular daily dose of furosemide 10/12/21   Alisa Graff, FNP  furosemide (LASIX) 20 MG tablet TAKE ONE TABLET BY MOUTH ONCE DAILY May take additional tablet daily if needed 02/17/22   Alisa Graff, FNP  gabapentin (NEURONTIN) 600 MG tablet TAKE ONE TABLET BY MOUTH EVERYDAY AT BEDTIME 01/18/22   [provider]  hydrocortisone 2.5 % cream Apply to affected under breasts 1-2 times a day as directed and as needed for itch. Can also use on face as needed for itch 01/05/21   Brendolyn Patty, MD  JARDIANCE 10 MG TABS tablet TAKE ONE TABLET BY MOUTH BEFORE BREAKFAST 12/20/21   Alisa Graff, FNP  ketoconazole (NIZORAL) 2 % cream Apply to affected areas under breast twice daily for rash until healed. Can also use on face as needed for dermatitis 01/05/21   Brendolyn Patty, MD  metoprolol tartrate (LOPRESSOR) 25 MG tablet TAKE THREE TABLETS BY MOUTH TWICE DAILY 11/19/21   Lelon Perla, MD  nystatin cream (MYCOSTATIN) Apply 1 Application topically 2 (two) times daily. 03/16/22   Pleas Koch, NP  oxybutynin (DITROPAN) 5 MG tablet Take  5 mg by mouth 2 (two) times daily. 11/01/21   [provider]  sotalol (BETAPACE) 120 MG tablet TAKE ONE TABLET BY MOUTH TWICE DAILY 05/26/21   Lelon Perla, MD  spironolactone (ALDACTONE) 25 MG tablet Take 1 tablet (25 mg total) by mouth daily. 02/18/22   Lelon Perla, MD  tiZANidine (ZANAFLEX) 4 MG tablet TAKE 2 TABLET (4 MG TOTAL) BY MOUTH DAILY FOR MUSCLE SPASMS AT BEDTIME. 06/22/21   Pleas Koch, NP      Allergies    Penicillins, Sulfa antibiotics, Codeine, Statins, and Aspirin    Review of Systems   Review of Systems  All other systems reviewed and are negative.   Physical Exam Updated Vital Signs BP 139/84   Pulse 61   Temp 98 F  (36.7 C)   Resp 20   SpO2 100%  Physical Exam Vitals and nursing note reviewed.  Constitutional:      Appearance: She is well-developed.  HENT:     Head: Atraumatic.  Cardiovascular:     Rate and Rhythm: Normal rate.  Pulmonary:     Effort: Pulmonary effort is normal.  Musculoskeletal:     Cervical back: Normal range of motion and neck supple.     Comments: Patient has midline cervical spine tenderness, left-flank tenderness, mid thoracic tenderness.  She is able to lift both of her legs and there is no pelvic instability  Skin:    General: Skin is warm and dry.  Neurological:     Mental Status: She is alert and oriented to person, place, and time.     ED Results / Procedures / Treatments   Labs (all labs ordered are listed, but only abnormal results are displayed) Labs Reviewed  BASIC METABOLIC PANEL - Abnormal; Notable for the following components:      Result Value   Glucose, Bld 102 (*)    Creatinine, Ser 1.02 (*)    GFR, Estimated 58 (*)    All other components within normal limits  CBC WITH DIFFERENTIAL/PLATELET - Abnormal; Notable for the following components:   Neutro Abs 7.9 (*)    All other components within normal limits    EKG None  Radiology DG Hip Unilat With Pelvis 2-3 Views Left  Result Date: 06/04/2022 CLINICAL DATA:  Pain post fall EXAM: DG HIP (WITH OR WITHOUT PELVIS) 2-3V LEFT COMPARISON:  None Available. FINDINGS: There is no evidence of hip fracture or dislocation. Lower lumbar facet DJD. There is no evidence of arthropathy or other focal bone abnormality. IMPRESSION: Negative. Electronically Signed   By: Lucrezia Europe M.D.   On: 06/04/2022 13:34   DG Shoulder Left  Result Date: 06/04/2022 CLINICAL DATA:  Pain post fall EXAM: LEFT SHOULDER - 2+ VIEW COMPARISON:  None available FINDINGS: Advanced glenohumeral DJD. No fracture or dislocation. Soft tissues are unremarkable. IMPRESSION: Advanced glenohumeral DJD. No acute findings. Electronically  Signed   By: Lucrezia Europe M.D.   On: 06/04/2022 13:33   CT Lumbar Spine Wo Contrast  Result Date: 06/04/2022 CLINICAL DATA:  Pain post fall EXAM: CT THORACIC SPINE WITHOUT CONTRAST CT LUMBAR SPINE WITHOUT CONTRAST TECHNIQUE: Multidetector CT images of the thoracic and lumbar spine were obtained using the standard protocol without intravenous contrast. RADIATION DOSE REDUCTION: This exam was performed according to the departmental dose-optimization program which includes automated exposure control, adjustment of the mA and/or kV according to patient size and/or use of iterative reconstruction technique. COMPARISON:  CT 09/20/2014 FINDINGS: Alignment: Normal Vertebrae: 12 rib-bearing  thoracic segments assigned T1-T12, 5 non-rib-bearing lumbar segments L1-L5. Anterior endplate spurring at all levels T2-L5. Paraspinal and other soft tissues: No paraspinal hematoma. Minimally displaced fracture posterior aspect left eighth rib incidentally noted. Disc levels: C5-C7: Degenerative disc disease, incompletely. T8-T11: Mild narrowing of interspaces T11-L2: Mild vacuum phenomenon in the interspaces. L2-S1: Bilateral facet DJD. IMPRESSION: 1. Negative for thoracic or lumbar fracture. 2. Minimally displaced fracture posterior left eighth rib. 3. Multilevel degenerative changes as above. Electronically Signed   By: Lucrezia Europe M.D.   On: 06/04/2022 13:32   CT T-SPINE NO CHARGE  Result Date: 06/04/2022 CLINICAL DATA:  Pain post fall EXAM: CT THORACIC SPINE WITHOUT CONTRAST CT LUMBAR SPINE WITHOUT CONTRAST TECHNIQUE: Multidetector CT images of the thoracic and lumbar spine were obtained using the standard protocol without intravenous contrast. RADIATION DOSE REDUCTION: This exam was performed according to the departmental dose-optimization program which includes automated exposure control, adjustment of the mA and/or kV according to patient size and/or use of iterative reconstruction technique. COMPARISON:  CT 09/20/2014  FINDINGS: Alignment: Normal Vertebrae: 12 rib-bearing thoracic segments assigned T1-T12, 5 non-rib-bearing lumbar segments L1-L5. Anterior endplate spurring at all levels T2-L5. Paraspinal and other soft tissues: No paraspinal hematoma. Minimally displaced fracture posterior aspect left eighth rib incidentally noted. Disc levels: C5-C7: Degenerative disc disease, incompletely. T8-T11: Mild narrowing of interspaces T11-L2: Mild vacuum phenomenon in the interspaces. L2-S1: Bilateral facet DJD. IMPRESSION: 1. Negative for thoracic or lumbar fracture. 2. Minimally displaced fracture posterior left eighth rib. 3. Multilevel degenerative changes as above. Electronically Signed   By: Lucrezia Europe M.D.   On: 06/04/2022 13:32   CT Chest Wo Contrast  Result Date: 06/04/2022 CLINICAL DATA:  Chest trauma, blunt, post fall EXAM: CT CHEST WITHOUT CONTRAST TECHNIQUE: Multidetector CT imaging of the chest was performed following the standard protocol without IV contrast. RADIATION DOSE REDUCTION: This exam was performed according to the departmental dose-optimization program which includes automated exposure control, adjustment of the mA and/or kV according to patient size and/or use of iterative reconstruction technique. COMPARISON:  09/20/2014 FINDINGS: Cardiovascular: Heart size normal. No pericardial effusion. Coronary calcifications. Atheromatous thoracic aorta. Mediastinum/Nodes: No mediastinal hematoma, mass or adenopathy.  Or Lungs/Pleura: No pleural effusion. No pneumothorax. Dependent subsegmental atelectasis at the lung bases. Linear platelike atelectasis in the right lower lobe. 1.7 cm thin-walled bleb in the superior segment right lower lobe. 5 mm pleural-based nodule, superior segment right lower lobe (Im63,Se5) . Upper Abdomen: No acute findings. Musculoskeletal: Left shoulder DJD. Anterior vertebral endplate spurring at multiple levels in the mid and lower thoracic spine. Multiple old right rib fracture  deformities. Acute minimally displaced fracture, posterior aspect left eighth rib (Im25,Se3). IMPRESSION: 1. Acute posterior left  eighth rib fracture. No pneumothorax. 2. Coronary and aortic Atherosclerosis (ICD10-170.0). 3. 5 mm right solid pulmonary nodule within the lower lobe. Per Fleischner Society Guidelines, no routine follow-up imaging is recommended. These guidelines do not apply to immunocompromised patients and patients with cancer. Follow up in patients with significant comorbidities as clinically warranted. Reference: Radiology. 2017; 284(1):228-43. Electronically Signed   By: Lucrezia Europe M.D.   On: 06/04/2022 13:22   CT Cervical Spine Wo Contrast  Result Date: 06/04/2022 CLINICAL DATA:  Trauma, fall EXAM: CT CERVICAL SPINE WITHOUT CONTRAST TECHNIQUE: Multidetector CT imaging of the cervical spine was performed without intravenous contrast. Multiplanar CT image reconstructions were also generated. RADIATION DOSE REDUCTION: This exam was performed according to the departmental dose-optimization program which includes automated exposure control, adjustment of the mA  and/or kV according to patient size and/or use of iterative reconstruction technique. COMPARISON:  02/02/2020 FINDINGS: Alignment: Alignment of posterior margins of vertebral bodies is within normal limits. Skull base and vertebrae: No recent fracture is seen. Soft tissues and spinal canal: There is extrinsic pressure over the ventral margin of thecal sac caused by posterior bony spurs and bulging of annulus, more so at C5-C6 level with mild spinal stenosis. Disc levels: There is encroachment of neural foramina from C2-C7 levels, more severe at C5-C6 and C6-C7 levels. Upper chest: Unremarkable. Other: There is mucosal thickening in right maxillary sinus. IMPRESSION: No recent fracture is seen in cervical spine. Cervical spondylosis with encroachment of neural foramina at multiple levels, more so at C5-C6 and C6-C7 levels. Chronic  sinusitis. Electronically Signed   By: Elmer Picker M.D.   On: 06/04/2022 13:09   CT Head Wo Contrast  Result Date: 06/04/2022 CLINICAL DATA:  Trauma, fall EXAM: CT HEAD WITHOUT CONTRAST TECHNIQUE: Contiguous axial images were obtained from the base of the skull through the vertex without intravenous contrast. RADIATION DOSE REDUCTION: This exam was performed according to the departmental dose-optimization program which includes automated exposure control, adjustment of the mA and/or kV according to patient size and/or use of iterative reconstruction technique. COMPARISON:  02/02/2020 FINDINGS: Brain: Ventricles are not dilated. There is no shift of midline structures. Ventricles are not dilated. There is decreased density in basal ganglia, periventricular white matter and subcortical white matter. Cortical sulci are slightly prominent. Vascular: Scattered arterial calcifications are seen. Skull: No fracture is seen. Sinuses/Orbits: There is opacification of right side of sphenoid sinus. There is mucosal thickening in ethmoid sinus. Other: None. IMPRESSION: No acute intracranial findings are seen in noncontrast CT brain. Atrophy. Small-vessel disease. Chronic sinusitis. Electronically Signed   By: Elmer Picker M.D.   On: 06/04/2022 13:05    Procedures Procedures    Medications Ordered in ED Medications  acetaminophen (TYLENOL) tablet 650 mg (650 mg Oral Given 06/04/22 1834)  tiZANidine (ZANAFLEX) tablet 4 mg (4 mg Oral Given 06/04/22 1834)    ED Course/ Medical Decision Making/ A&P                           Medical Decision Making Amount and/or Complexity of Data Reviewed Labs: ordered.  Risk OTC drugs. Prescription drug management. Decision regarding hospitalization.   This patient presents to the ED with chief complaint(s) of mechanical fall with pertinent past medical history of Parkinson's disease, A-fib.The complaint involves an extensive differential diagnosis and  also carries with it a high risk of complications and morbidity.    The differential diagnosis includes : Subdural hematoma, cervical spine fracture, rib fracture, pneumothorax, thoracic spine fracture, soft tissue injury, contusion.  Appropriate work-up was initiated from the ER, including labs and imaging. CT scan indicates that patient has isolated posterior left-sided eighth rib fracture.  This is the area where she is hurting.  As soon as we tried to move the patient, she is in a lot of discomfort.  We will give her some fentanyl and tried to ambulate her again, but she remained in severe pain and was unable to get up.  In the setting of baseline for ambulation, weakness, Parkinson's disease and now rib fracture that is causing severe pain, family is not comfortable taking patient home.  They are amenable to admission and rehab admission if needed.   Additional history obtained: Additional history obtained from  caregiver Records reviewed  patient's regular medications were reviewed  Independent labs interpretation:  The following labs were independently interpreted: CBC, BMP is reassuring.  Independent visualization and interpretation of imaging: - I independently visualized the following imaging with scope of interpretation limited to determining acute life threatening conditions related to emergency care: X-ray of the chest, which revealed no pneumothorax.  CT brain that is negative for brain bleed  Treatment and Reassessment: Patient reassessed on 2 separate occasion.  Continues to have severe pain when she tries to get up.  Plan is to proceed with admission.   Final Clinical Impression(s) / ED Diagnoses Final diagnoses:  Closed fracture of one rib of left side, initial encounter    Rx / DC Orders ED Discharge Orders     None         Varney Biles, MD 06/04/22 2144

## 2022-06-04 NOTE — ED Triage Notes (Signed)
Patient from home after fall, mechanical fall tripping over feet. Pt w/ hx of parkinsons. C/o L back, neck, hip pain. VSS. No blood thinner. Patient denies hitting head

## 2022-06-05 DIAGNOSIS — G20A1 Parkinson's disease without dyskinesia, without mention of fluctuations: Secondary | ICD-10-CM | POA: Diagnosis not present

## 2022-06-05 DIAGNOSIS — I5032 Chronic diastolic (congestive) heart failure: Secondary | ICD-10-CM | POA: Diagnosis not present

## 2022-06-05 DIAGNOSIS — S2232XA Fracture of one rib, left side, initial encounter for closed fracture: Secondary | ICD-10-CM | POA: Diagnosis not present

## 2022-06-05 DIAGNOSIS — I480 Paroxysmal atrial fibrillation: Secondary | ICD-10-CM | POA: Diagnosis not present

## 2022-06-05 DIAGNOSIS — L8990 Pressure ulcer of unspecified site, unspecified stage: Secondary | ICD-10-CM | POA: Insufficient documentation

## 2022-06-05 LAB — BASIC METABOLIC PANEL
Anion gap: 7 (ref 5–15)
BUN: 20 mg/dL (ref 8–23)
CO2: 26 mmol/L (ref 22–32)
Calcium: 9.6 mg/dL (ref 8.9–10.3)
Chloride: 104 mmol/L (ref 98–111)
Creatinine, Ser: 0.98 mg/dL (ref 0.44–1.00)
GFR, Estimated: >60 mL/min (ref >60)
Glucose, Bld: 95 mg/dL (ref 70–99)
Potassium: 3.7 mmol/L (ref 3.5–5.1)
Sodium: 137 mmol/L (ref 135–145)

## 2022-06-05 LAB — CBC
HCT: 42.6 % (ref 36.0–46.0)
Hemoglobin: 13.6 g/dL (ref 12.0–15.0)
MCH: 31.9 pg (ref 26.0–34.0)
MCHC: 31.9 g/dL (ref 30.0–36.0)
MCV: 99.8 fL (ref 80.0–100.0)
Platelets: 215 10*3/uL (ref 150–400)
RBC: 4.27 MIL/uL (ref 3.87–5.11)
RDW: 13.1 % (ref 11.5–15.5)
WBC: 8.9 10*3/uL (ref 4.0–10.5)
nRBC: 0 % (ref 0.0–0.2)

## 2022-06-05 MED ORDER — MELATONIN 3 MG PO TABS
3.0000 mg | ORAL_TABLET | Freq: Every day | ORAL | Status: DC
  Administered 2022-06-05 – 2022-06-07 (×3): 3 mg via ORAL
  Filled 2022-06-05 (×3): qty 1

## 2022-06-05 NOTE — Evaluation (Signed)
Physical Therapy Evaluation Patient Details Name: Selena Bush MRN: [id number] DOB: Dec 14, 1945 Today's Date: 06/05/2022  History of Present Illness  Pt is a 76 y/o female admitted secondary to fall. Pt found to have L 8th rib fx. PMH includes dCHF, a fib, parkinson's and CKD.  Clinical Impression  Pt admitted secondary to problem above with deficits below. Pt requiring max A for bed mobility this session. Unsafe to attempt further transfers with +1 assist from higher stretcher. Pt previously ambulated with RW. Given current deficits. Recommending SNF level therapies at d/c. Will continue to follow acutely.        Recommendations for follow up therapy are one component of a multi-disciplinary discharge planning process, led by the attending physician.  Recommendations may be updated based on patient status, additional functional criteria and insurance authorization.  Follow Up Recommendations Skilled nursing-short term rehab (<3 hours/day) Can patient physically be transported by private vehicle: No    Assistance Recommended at Discharge Frequent or constant Supervision/Assistance  Patient can return home with the following  A lot of help with walking and/or transfers;A lot of help with bathing/dressing/bathroom;Assistance with cooking/housework;Assist for transportation;Help with stairs or ramp for entrance    Equipment Recommendations None recommended by PT  Recommendations for Other Services       Functional Status Assessment Patient has had a recent decline in their functional status and demonstrates the ability to make significant improvements in function in a reasonable and predictable amount of time.     Precautions / Restrictions Precautions Precautions: Fall Restrictions Weight Bearing Restrictions: No Other Position/Activity Restrictions: \      Mobility  Bed Mobility Overal bed mobility: Needs Assistance Bed Mobility: Supine to Sit, Sit to Supine     Supine to  sit: Max assist Sit to supine: Max assist   General bed mobility comments: Assist for trunk and LEs. up to min A for sitting balance.    Transfers                   General transfer comment: Unsafe to attempt with +1 from higher stretcher.    Ambulation/Gait                  Stairs            Wheelchair Mobility    Modified Rankin (Stroke Patients Only)       Balance Overall balance assessment: Needs assistance Sitting-balance support: Bilateral upper extremity supported Sitting balance-Leahy Scale: Poor Sitting balance - Comments: Reliant on UE and at least min A                                     Pertinent Vitals/Pain Pain Assessment Pain Assessment: Faces Faces Pain Scale: Hurts even more Pain Location: back and L rib Pain Descriptors / Indicators: Guarding, Grimacing Pain Intervention(s): Limited activity within patient's tolerance, Monitored during session    Home Living Family/patient expects to be discharged to:: Private residence Living Arrangements: Other (Comment) (caregiuver) Available Help at Discharge: Personal care attendant;Available 24 hours/day Type of Home: Apartment Home Access: Ramped entrance       Home Layout: Two level;Able to live on main level with bedroom/bathroom Home Equipment: Rolling Walker (2 wheels);Shower seat;Cane - single point;Grab bars - tub/shower Additional Comments: Lives with caretaker Holley Raring, but has separate aide with [facility]    Prior Function Prior Level of Function : Needs assist  Mobility Comments: Uses RW with mobility and needs assist at times ADLs Comments: Caregiver assists with medication management. Aide assists with bathing/dressing     Hand Dominance        Extremity/Trunk Assessment   Upper Extremity Assessment Upper Extremity Assessment: Generalized weakness    Lower Extremity Assessment Lower Extremity Assessment: Generalized weakness     Cervical / Trunk Assessment Cervical / Trunk Assessment: Kyphotic  Communication   Communication: No difficulties  Cognition Arousal/Alertness: Awake/alert Behavior During Therapy: WFL for tasks assessed/performed Overall Cognitive Status: No family/caregiver present to determine baseline cognitive functioning                                          General Comments      Exercises General Exercises - Lower Extremity Long Arc Quad: AROM, Both, 10 reps, Seated Hip Flexion/Marching: AROM, 5 reps, Both, Seated   Assessment/Plan    PT Assessment Patient needs continued PT services  PT Problem List Decreased strength;Decreased activity tolerance;Decreased mobility;Decreased balance;Decreased knowledge of precautions;Decreased cognition;Decreased knowledge of use of DME       PT Treatment Interventions DME instruction;Gait training;Functional mobility training;Therapeutic exercise;Balance training;Therapeutic activities;Patient/family education    PT Goals (Current goals can be found in the Care Plan section)  Acute Rehab PT Goals Patient Stated Goal: to get stronger PT Goal Formulation: With patient Time For Goal Achievement: 06/19/22 Potential to Achieve Goals: Fair    Frequency Min 2X/week     Co-evaluation               AM-PAC PT "6 Clicks" Mobility  Outcome Measure Help needed turning from your back to your side while in a flat bed without using bedrails?: A Lot Help needed moving from lying on your back to sitting on the side of a flat bed without using bedrails?: A Lot Help needed moving to and from a bed to a chair (including a wheelchair)?: A Lot Help needed standing up from a chair using your arms (e.g., wheelchair or bedside chair)?: A Lot Help needed to walk in hospital room?: Total Help needed climbing 3-5 steps with a railing? : Total 6 Click Score: 10    End of Session   Activity Tolerance: Patient tolerated treatment  well Patient left: in bed;with call bell/phone within reach (on stretcher in ED) Nurse Communication: Mobility status PT Visit Diagnosis: Other abnormalities of gait and mobility (R26.89);History of falling (Z91.81);Muscle weakness (generalized) (M62.81);Unsteadiness on feet (R26.81);Difficulty in walking, not elsewhere classified (R26.2)    Time: 5409-8119 PT Time Calculation (min) (ACUTE ONLY): 14 min   Charges:   PT Evaluation $PT Eval Moderate Complexity: 1 Mod          Reuel Derby, PT, DPT  Acute Rehabilitation Services  Office: [phone]   Rudean Hitt 06/05/2022, 10:45 AM

## 2022-06-05 NOTE — Progress Notes (Signed)
[facility] PROGRESS NOTE    Progress Note  Selena Bush  QIW:[id number] DOB: February 16, 1946 DOA: 06/04/2022 PCP: Merryl Hacker, No     Brief Narrative:   Selena Bush is an 76 y.o. female past medical history significant for Parkinson disease, chronic diastolic heart failure paroxysmal atrial fibrillation not on anticoagulation due to history of hemorrhagic pericardial effusion and frequent falls into the ED with left hip and back pain after a mechanical fall.  X-ray of the pelvis, hip and shoulder were negative for acute fractures, CT of the head showed no acute findings.  CT C-spine was negative for acute fracture.  CT chest revealed minimal displaced posterior eighth rib fracture    Assessment/Plan:   Eight rib fracture: She is unable to ambulate due to significant pain. Continue narcotic started on a bowel regimen. Physical therapy and Occupational Therapy has been consulted. Out of bed to chair, continue narcotics for pain control, incentive spirometry and flutter valve have been ordered.  Ambulatory dysfunction/debility: Patient has had progressive bilateral lower extremity weakness the family has been trying to work with the patient to try to go to rehab which she has been adamant. Physical therapy and Occupational Therapy has been consulted.  Parkinson's disease: Probably contributing to her debility, continue Sinemet.  Chronic diastolic heart failure: No changes to her medication, on admission she appears euvolemic her medication has been continued.  Paroxysmal atrial fibrillation: Currently rate controlled continue sotalol not a candidate for anticoagulation due to multiple falls and history of hemorrhagic pericardial effusion.  Chronic kidney disease stage IIIb: Her creatinine appears to be at baseline.  DVT prophylaxis: Lovenox Family Communication:none Status is: Observation The patient remains OBS appropriate and will d/c before 2 midnights.    Code Status:      Code Status Orders  (From admission, onward)           Start     Ordered   06/04/22 2202  Full code  Continuous        06/04/22 2203           Code Status History     Date Active Date Inactive Code Status Order ID Comments User Context   11/12/2017 1745 11/20/2017 1604 Full Code [id number]  Ledell Noss, MD ED   11/04/2017 1013 11/10/2017 1734 Full Code [id number]  Molt, Romelle Starcher, DO ED   09/15/2014 1141 09/17/2014 1620 Full Code [id number]  Pricilla Loveless, PA-C Inpatient   08/19/2014 1202 08/19/2014 1912 Full Code [id number]  Pricilla Loveless, PA-C Inpatient   04/16/2013 1547 04/19/2013 1706 Full Code [id number]  Babish, Lucille Passy, PA-C Inpatient         IV Access:   Peripheral IV   Procedures and diagnostic studies:   DG Hip Unilat With Pelvis 2-3 Views Left  Result Date: 06/04/2022 CLINICAL DATA:  Pain post fall EXAM: DG HIP (WITH OR WITHOUT PELVIS) 2-3V LEFT COMPARISON:  None Available. FINDINGS: There is no evidence of hip fracture or dislocation. Lower lumbar facet DJD. There is no evidence of arthropathy or other focal bone abnormality. IMPRESSION: Negative. Electronically Signed   By: Lucrezia Europe M.D.   On: 06/04/2022 13:34   DG Shoulder Left  Result Date: 06/04/2022 CLINICAL DATA:  Pain post fall EXAM: LEFT SHOULDER - 2+ VIEW COMPARISON:  None available FINDINGS: Advanced glenohumeral DJD. No fracture or dislocation. Soft tissues are unremarkable. IMPRESSION: Advanced glenohumeral DJD. No acute findings. Electronically Signed   By: Lucrezia Europe M.D.   On: 06/04/2022 13:33  CT Lumbar Spine Wo Contrast  Result Date: 06/04/2022 CLINICAL DATA:  Pain post fall EXAM: CT THORACIC SPINE WITHOUT CONTRAST CT LUMBAR SPINE WITHOUT CONTRAST TECHNIQUE: Multidetector CT images of the thoracic and lumbar spine were obtained using the standard protocol without intravenous contrast. RADIATION DOSE REDUCTION: This exam was performed according to the departmental dose-optimization  program which includes automated exposure control, adjustment of the mA and/or kV according to patient size and/or use of iterative reconstruction technique. COMPARISON:  CT 09/20/2014 FINDINGS: Alignment: Normal Vertebrae: 12 rib-bearing thoracic segments assigned T1-T12, 5 non-rib-bearing lumbar segments L1-L5. Anterior endplate spurring at all levels T2-L5. Paraspinal and other soft tissues: No paraspinal hematoma. Minimally displaced fracture posterior aspect left eighth rib incidentally noted. Disc levels: C5-C7: Degenerative disc disease, incompletely. T8-T11: Mild narrowing of interspaces T11-L2: Mild vacuum phenomenon in the interspaces. L2-S1: Bilateral facet DJD. IMPRESSION: 1. Negative for thoracic or lumbar fracture. 2. Minimally displaced fracture posterior left eighth rib. 3. Multilevel degenerative changes as above. Electronically Signed   By: Lucrezia Europe M.D.   On: 06/04/2022 13:32   CT T-SPINE NO CHARGE  Result Date: 06/04/2022 CLINICAL DATA:  Pain post fall EXAM: CT THORACIC SPINE WITHOUT CONTRAST CT LUMBAR SPINE WITHOUT CONTRAST TECHNIQUE: Multidetector CT images of the thoracic and lumbar spine were obtained using the standard protocol without intravenous contrast. RADIATION DOSE REDUCTION: This exam was performed according to the departmental dose-optimization program which includes automated exposure control, adjustment of the mA and/or kV according to patient size and/or use of iterative reconstruction technique. COMPARISON:  CT 09/20/2014 FINDINGS: Alignment: Normal Vertebrae: 12 rib-bearing thoracic segments assigned T1-T12, 5 non-rib-bearing lumbar segments L1-L5. Anterior endplate spurring at all levels T2-L5. Paraspinal and other soft tissues: No paraspinal hematoma. Minimally displaced fracture posterior aspect left eighth rib incidentally noted. Disc levels: C5-C7: Degenerative disc disease, incompletely. T8-T11: Mild narrowing of interspaces T11-L2: Mild vacuum phenomenon in the  interspaces. L2-S1: Bilateral facet DJD. IMPRESSION: 1. Negative for thoracic or lumbar fracture. 2. Minimally displaced fracture posterior left eighth rib. 3. Multilevel degenerative changes as above. Electronically Signed   By: Lucrezia Europe M.D.   On: 06/04/2022 13:32   CT Chest Wo Contrast  Result Date: 06/04/2022 CLINICAL DATA:  Chest trauma, blunt, post fall EXAM: CT CHEST WITHOUT CONTRAST TECHNIQUE: Multidetector CT imaging of the chest was performed following the standard protocol without IV contrast. RADIATION DOSE REDUCTION: This exam was performed according to the departmental dose-optimization program which includes automated exposure control, adjustment of the mA and/or kV according to patient size and/or use of iterative reconstruction technique. COMPARISON:  09/20/2014 FINDINGS: Cardiovascular: Heart size normal. No pericardial effusion. Coronary calcifications. Atheromatous thoracic aorta. Mediastinum/Nodes: No mediastinal hematoma, mass or adenopathy.  Or Lungs/Pleura: No pleural effusion. No pneumothorax. Dependent subsegmental atelectasis at the lung bases. Linear platelike atelectasis in the right lower lobe. 1.7 cm thin-walled bleb in the superior segment right lower lobe. 5 mm pleural-based nodule, superior segment right lower lobe (Im63,Se5) . Upper Abdomen: No acute findings. Musculoskeletal: Left shoulder DJD. Anterior vertebral endplate spurring at multiple levels in the mid and lower thoracic spine. Multiple old right rib fracture deformities. Acute minimally displaced fracture, posterior aspect left eighth rib (Im25,Se3). IMPRESSION: 1. Acute posterior left  eighth rib fracture. No pneumothorax. 2. Coronary and aortic Atherosclerosis (ICD10-170.0). 3. 5 mm right solid pulmonary nodule within the lower lobe. Per Fleischner Society Guidelines, no routine follow-up imaging is recommended. These guidelines do not apply to immunocompromised patients and patients with cancer. Follow up in  patients with significant comorbidities as clinically warranted. Reference: Radiology. 2017; 284(1):228-43. Electronically Signed   By: Lucrezia Europe M.D.   On: 06/04/2022 13:22   CT Cervical Spine Wo Contrast  Result Date: 06/04/2022 CLINICAL DATA:  Trauma, fall EXAM: CT CERVICAL SPINE WITHOUT CONTRAST TECHNIQUE: Multidetector CT imaging of the cervical spine was performed without intravenous contrast. Multiplanar CT image reconstructions were also generated. RADIATION DOSE REDUCTION: This exam was performed according to the departmental dose-optimization program which includes automated exposure control, adjustment of the mA and/or kV according to patient size and/or use of iterative reconstruction technique. COMPARISON:  02/02/2020 FINDINGS: Alignment: Alignment of posterior margins of vertebral bodies is within normal limits. Skull base and vertebrae: No recent fracture is seen. Soft tissues and spinal canal: There is extrinsic pressure over the ventral margin of thecal sac caused by posterior bony spurs and bulging of annulus, more so at C5-C6 level with mild spinal stenosis. Disc levels: There is encroachment of neural foramina from C2-C7 levels, more severe at C5-C6 and C6-C7 levels. Upper chest: Unremarkable. Other: There is mucosal thickening in right maxillary sinus. IMPRESSION: No recent fracture is seen in cervical spine. Cervical spondylosis with encroachment of neural foramina at multiple levels, more so at C5-C6 and C6-C7 levels. Chronic sinusitis. Electronically Signed   By: Elmer Picker M.D.   On: 06/04/2022 13:09   CT Head Wo Contrast  Result Date: 06/04/2022 CLINICAL DATA:  Trauma, fall EXAM: CT HEAD WITHOUT CONTRAST TECHNIQUE: Contiguous axial images were obtained from the base of the skull through the vertex without intravenous contrast. RADIATION DOSE REDUCTION: This exam was performed according to the departmental dose-optimization program which includes automated exposure  control, adjustment of the mA and/or kV according to patient size and/or use of iterative reconstruction technique. COMPARISON:  02/02/2020 FINDINGS: Brain: Ventricles are not dilated. There is no shift of midline structures. Ventricles are not dilated. There is decreased density in basal ganglia, periventricular white matter and subcortical white matter. Cortical sulci are slightly prominent. Vascular: Scattered arterial calcifications are seen. Skull: No fracture is seen. Sinuses/Orbits: There is opacification of right side of sphenoid sinus. There is mucosal thickening in ethmoid sinus. Other: None. IMPRESSION: No acute intracranial findings are seen in noncontrast CT brain. Atrophy. Small-vessel disease. Chronic sinusitis. Electronically Signed   By: Elmer Picker M.D.   On: 06/04/2022 13:05     Medical Consultants:   None.   Subjective:    Selena Bush she relates her pain is controlled her last bowel movement was yesterday.  Objective:    Vitals:   06/05/22 0400 06/05/22 0442 06/05/22 0445 06/05/22 0530  BP: 133/78 133/78 132/75 130/73  Pulse: 60 (!) 58 (!) 59 63  Resp: '18 14 13 11  '$ Temp:  98.5 F (36.9 C)    TempSrc:  Oral    SpO2: 92% 92% 90% 90%   SpO2: 90 %  No intake or output data in the 24 hours ending 06/05/22 0737 There were no vitals filed for this visit.  Exam: General exam: In no acute distress. Respiratory system: Good air movement and clear to auscultation. Cardiovascular system: S1 & S2 heard, RRR. No JVD. Gastrointestinal system: Abdomen is nondistended, soft and nontender.  Extremities: No pedal edema. Skin: No rashes, lesions or ulcers Psychiatry: Judgement and insight appear normal. Mood & affect appropriate.    Data Reviewed:    Labs: Basic Metabolic Panel: Recent Labs  Lab 06/04/22 1949 06/05/22 0203  NA 138 137  K 3.9 3.7  CL 100 104  CO2 29 26  GLUCOSE 102* 95  BUN 17 20  CREATININE 1.02* 0.98  CALCIUM 10.0 9.6    GFR CrCl cannot be calculated (Unknown ideal weight.). Liver Function Tests: No results for input(s): "AST", "ALT", "ALKPHOS", "BILITOT", "PROT", "ALBUMIN" in the last 168 hours. No results for input(s): "LIPASE", "AMYLASE" in the last 168 hours. No results for input(s): "AMMONIA" in the last 168 hours. Coagulation profile No results for input(s): "INR", "PROTIME" in the last 168 hours. COVID-19 Labs  No results for input(s): "DDIMER", "FERRITIN", "LDH", "CRP" in the last 72 hours.  Lab Results  Component Value Date   [place] NEGATIVE 06/06/2021    CBC: Recent Labs  Lab 06/04/22 1949 06/05/22 0203  WBC 10.4 8.9  NEUTROABS 7.9*  --   HGB 14.3 13.6  HCT 43.5 42.6  MCV 98.9 99.8  PLT 202 215   Cardiac Enzymes: No results for input(s): "CKTOTAL", "CKMB", "CKMBINDEX", "TROPONINI" in the last 168 hours. BNP (last 3 results) No results for input(s): "PROBNP" in the last 8760 hours. CBG: No results for input(s): "GLUCAP" in the last 168 hours. D-Dimer: No results for input(s): "DDIMER" in the last 72 hours. Hgb A1c: No results for input(s): "HGBA1C" in the last 72 hours. Lipid Profile: No results for input(s): "CHOL", "HDL", "LDLCALC", "TRIG", "CHOLHDL", "LDLDIRECT" in the last 72 hours. Thyroid function studies: No results for input(s): "TSH", "T4TOTAL", "T3FREE", "THYROIDAB" in the last 72 hours.  Invalid input(s): "FREET3" Anemia work up: No results for input(s): "VITAMINB12", "FOLATE", "FERRITIN", "TIBC", "IRON", "RETICCTPCT" in the last 72 hours. Sepsis Labs: Recent Labs  Lab 06/04/22 1949 06/05/22 0203  WBC 10.4 8.9   Microbiology No results found for this or any previous visit (from the past 240 hour(s)).   Medications:    carbidopa-levodopa  1 tablet Oral 3 times per day   carbidopa-levodopa  2 tablet Oral 3 times per day   DULoxetine  60 mg Oral BID   enoxaparin (LOVENOX) injection  40 mg Subcutaneous Q24H   gabapentin  600 mg Oral QHS    lidocaine  1 patch Transdermal Q24H   metoprolol tartrate  75 mg Oral BID   oxybutynin  5 mg Oral BID   sotalol  120 mg Oral BID   spironolactone  25 mg Oral Daily   Continuous Infusions:    LOS: 0 days   Charlynne Cousins  [facility]  06/05/2022, 7:37 AM

## 2022-06-05 NOTE — Progress Notes (Signed)
Pt placed on CPAP auto mode for the night on room air

## 2022-06-06 DIAGNOSIS — I480 Paroxysmal atrial fibrillation: Secondary | ICD-10-CM | POA: Diagnosis not present

## 2022-06-06 DIAGNOSIS — N1831 Chronic kidney disease, stage 3a: Secondary | ICD-10-CM | POA: Diagnosis not present

## 2022-06-06 DIAGNOSIS — I5032 Chronic diastolic (congestive) heart failure: Secondary | ICD-10-CM | POA: Diagnosis not present

## 2022-06-06 DIAGNOSIS — S2232XA Fracture of one rib, left side, initial encounter for closed fracture: Secondary | ICD-10-CM | POA: Diagnosis not present

## 2022-06-06 MED ORDER — POLYETHYLENE GLYCOL 3350 17 G PO PACK
17.0000 g | PACK | Freq: Two times a day (BID) | ORAL | Status: AC
  Administered 2022-06-06 – 2022-06-07 (×3): 17 g via ORAL
  Filled 2022-06-06 (×4): qty 1

## 2022-06-06 NOTE — Progress Notes (Signed)
Mobility Specialist - Progress Note   06/06/22 1500  Mobility  Activity Transferred to/from BSC  Level of Assistance Moderate assist, patient does 50-74% (+2)  Assistive Device Front wheel walker  Distance Ambulated (ft) 0 ft  Activity Response Tolerated well  $Mobility charge 1 Mobility    Pt received in recliner requesting to use BSC. Left on [facility] w/ call bell.   Paulla Dolly Mobility Specialist

## 2022-06-06 NOTE — Progress Notes (Signed)
Mobility Specialist - Progress Note   06/06/22 1304  Mobility  Activity Transferred from bed to chair  Level of Assistance Moderate assist, patient does 50-74% (+2)  Assistive Device Front wheel walker  Distance Ambulated (ft) 0 ft  Activity Response Tolerated well  $Mobility charge 1 Mobility    Pt received in bed agreeable to mobility. Left in chair w/ call bell in reach and all needs met.   Paulla Dolly Mobility Specialist

## 2022-06-06 NOTE — TOC Initial Note (Addendum)
Transition of Care [facility]) - Initial/Assessment Note    Patient Details  Name: Selena Bush MRN: [id number] Date of Birth: 1946/07/21  [facility] CM/SW Contact:    Tresa Endo Phone Number: 06/06/2022, 1:16 PM  Clinical Narrative:                 CSW received SNF consult. CSW met with pt at bedside. CSW introduced self and explained role at the hospital. Pt reports that PTA the patient lived at home with family. PT has worked with patient found that pt needs maxA for bed mobility but was ambulating with a RW before admission.  Pt was not able to attempt any ambulation therefore PT has recommended SNF.   Pt is insured by [employer] and CSW has shared the SNF facilities covered by [employer]. CSW will contact [employer] social worker for to move forward with SNF placement.  CSW spoke with pt [facility] case worker, she will send the patient's PT note to their rehab team to get approved. In the meantime CSW will continue to follow for SNF placement.   Pt has been faxed out to [place], [place], [place], and [facility].     Expected Discharge Plan: Skilled Nursing Facility Barriers to Discharge: Continued Medical Work up   Patient Goals and CMS Choice Patient states their goals for this hospitalization and ongoing recovery are:: Rehab CMS Medicare.gov Compare Post Acute Care list provided to:: Patient Choice offered to / list presented to : Patient  Expected Discharge Plan and Services Expected Discharge Plan: [place] In-house Referral: Clinical Social Work   Post Acute Care Choice: [place] Living arrangements for the past 2 months: [place]                                      Prior Living Arrangements/Services Living arrangements for the past 2 months: Single Family Home Lives with:: Other (Comment) (Caregiver) Patient language and need for interpreter reviewed:: Yes Do you feel safe going back to the place where  you live?: Yes      Need for Family Participation in Patient Care: Yes (Comment) Care giver support system in place?: Yes (comment)   Criminal Activity/Legal Involvement Pertinent to Current Situation/Hospitalization: No - Comment as needed  Activities of Daily Living      Permission Sought/Granted Permission sought to share information with : Facility Sport and exercise psychologist, Family Supports Permission granted to share information with : Yes, Verbal Permission Granted  Share Information with NAME: Ames Coupe [phone]   [phone]  Permission granted to share info w AGENCY: SNF  Permission granted to share info w Relationship: Ames Coupe [phone]   [phone]  Permission granted to share info w Contact Information: Ames Coupe [phone]   [phone]  Emotional Assessment Appearance:: Appears stated age Attitude/Demeanor/Rapport: Engaged Affect (typically observed): Accepting Orientation: : Oriented to Self, Oriented to Place, Oriented to  Time, Oriented to Situation Alcohol / Substance Use: Not Applicable Psych Involvement: No (comment)  Admission diagnosis:  Rib fracture [S22.39XA] Closed fracture of one rib of left side, initial encounter [S22.32XA] Patient Active Problem List   Diagnosis Date Noted   Pressure injury of skin 06/05/2022   Rib fracture 06/04/2022   Stage 3a chronic kidney disease (CKD) ([place]) 06/04/2022   Skin ulcer with fat layer exposed ([place]) 03/16/2022   Vitamin B 12 deficiency 01/27/2022   Malaise 01/27/2022  Fever and chills 01/27/2022   Wheezing 01/27/2022   Vitamin D deficiency 01/27/2022   Hallucinations 11/24/2021   Abdominal pain 11/12/2021   Parkinson's disease 07/29/2019   DDD (degenerative disc disease), lumbar 10/19/2018   Preventative health care 10/19/2018   Prediabetes 10/19/2018   (HFpEF) heart failure with preserved ejection fraction ([place]) 12/12/2017   Pericardial effusion with cardiac tamponade  11/13/2017   Paroxysmal atrial fibrillation ([place]) 11/05/2017   Acute cough 05/30/2017   Anxiety 03/21/2016   Insomnia 07/17/2015   MI (mitral incompetence) 12/10/2014   Major depressive disorder 12/09/2014   OSA (obstructive sleep apnea) 12/09/2014   GERD (gastroesophageal reflux disease) 12/09/2014   Chronic diastolic CHF (congestive heart failure) ([place])    HLD (hyperlipidemia)    Osteoarthritis of both knees    S/P left TKA 08/19/2014   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 (BMI) 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 ([place]) 04/17/2013   History of ductal carcinoma in situ (DCIS) of breast 11/05/2012   Essential hypertension, benign 11/05/2012   PCP:  Pcp, No Pharmacy:   [facility] - [place], [place] - [address] [place] [address] Phone: [phone] Fax: [phone]  [id number] [place] [address]: [phone]: [phone]    Social Determinants of Health (SDOH) Interventions    Readmission Risk Interventions     No data to display

## 2022-06-06 NOTE — NC FL2 (Signed)
[place] LEVEL OF CARE SCREENING TOOL     IDENTIFICATION  Patient Name: Selena Bush Birthdate: 03-25-1946 Sex: female Admission Date (Current Location): 06/04/2022  [facility] and [place] Number:  Herbalist and Address:  [facility], [place] [address] [place], [place] [address]      Provider Number: [id number]  Attending Physician Name and Address:  Aileen Fass, Tammi Klippel, MD  Relative Name and Phone Number:  [facility])   [phone]    Current Level of Care: Hospital Recommended Level of Care: [place] Prior Approval Number:    Date Approved/Denied:   PASRR Number: [id number]  Discharge Plan: SNF    Current Diagnoses: Patient Active Problem List   Diagnosis Date Noted   Pressure injury of skin 06/05/2022   Rib fracture 06/04/2022   Stage 3a chronic kidney disease (CKD) ([place]) 06/04/2022   Skin ulcer with fat layer exposed ([place]) 03/16/2022   Vitamin B 12 deficiency 01/27/2022   Malaise 01/27/2022   Fever and chills 01/27/2022   Wheezing 01/27/2022   Vitamin D deficiency 01/27/2022   Hallucinations 11/24/2021   Abdominal pain 11/12/2021   Parkinson's disease 07/29/2019   DDD (degenerative disc disease), lumbar 10/19/2018   Preventative health care 10/19/2018   Prediabetes 10/19/2018   (HFpEF) heart failure with preserved ejection fraction ([place]) 12/12/2017   Pericardial effusion with cardiac tamponade 11/13/2017   Paroxysmal atrial fibrillation ([place]) 11/05/2017   Acute cough 05/30/2017   Anxiety 03/21/2016   Insomnia 07/17/2015   MI (mitral incompetence) 12/10/2014   Major depressive disorder 12/09/2014   OSA (obstructive sleep apnea) 12/09/2014   GERD (gastroesophageal reflux disease) 12/09/2014   Chronic diastolic CHF (congestive heart failure) ([place])    HLD (hyperlipidemia)    Osteoarthritis of both knees    S/P left TKA 08/19/2014   Obesity with alveolar hypoventilation and body  mass index (BMI) 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 ([place]) 04/17/2013   History of ductal carcinoma in situ (DCIS) of breast 11/05/2012   Essential hypertension, benign 11/05/2012    Orientation RESPIRATION BLADDER Height & Weight     Self, Time, Situation, Place  Normal   Weight:   Height:     BEHAVIORAL SYMPTOMS/MOOD NEUROLOGICAL BOWEL NUTRITION STATUS        Diet (See DC Summary)  AMBULATORY STATUS COMMUNICATION OF NEEDS Skin   Extensive Assist Verbally Normal                       Personal Care Assistance Level of Assistance  Bathing, Feeding, Dressing Bathing Assistance: Maximum assistance Feeding assistance: Independent Dressing Assistance: Maximum assistance     Functional Limitations Info  Sight, Hearing, Speech Sight Info: Impaired Hearing Info: Adequate Speech Info: Adequate    SPECIAL CARE FACTORS FREQUENCY  PT (By licensed PT), OT (By licensed OT)     PT Frequency: 5x a week OT Frequency: 5x a week            Contractures Contractures Info: Not present    Additional Factors Info  Code Status, Allergies Code Status Info: Full Allergies Info: Penicillins   Sulfa Antibiotics   Codeine   Statins   Aspirin           Current Medications (06/06/2022):  This is the current hospital active medication list Current Facility-Administered Medications  Medication Dose Route Frequency Provider Last Rate Last Admin   acetaminophen (TYLENOL) tablet 650 mg  650 mg Oral Q6H PRN Opyd, Christia Reading  S, MD   650 mg at 06/05/22 1436   carbidopa-levodopa (SINEMET IR) 25-100 MG per tablet immediate release 1 tablet  1 tablet Oral 3 times per day Vianne Bulls, MD   1 tablet at 06/06/22 1056   carbidopa-levodopa (SINEMET IR) 25-100 MG per tablet immediate release 2 tablet  2 tablet Oral 3 times per day Vianne Bulls, MD   2 tablet at 06/06/22 9407   DULoxetine (CYMBALTA) DR capsule 60 mg  60 mg Oral BID Opyd, Ilene Qua, MD   60 mg at 06/06/22 0900   enoxaparin (LOVENOX) injection 40 mg   40 mg Subcutaneous Q24H Opyd, Ilene Qua, MD   40 mg at 06/06/22 0859   gabapentin (NEURONTIN) capsule 600 mg  600 mg Oral QHS Opyd, Ilene Qua, MD   600 mg at 06/05/22 2153   lidocaine (LIDODERM) 5 % 1 patch  1 patch Transdermal Q24H Opyd, Ilene Qua, MD   1 patch at 06/05/22 2154   melatonin tablet 3 mg  3 mg Oral QHS Charlynne Cousins, MD   3 mg at 06/05/22 2154   metoprolol tartrate (LOPRESSOR) tablet 75 mg  75 mg Oral BID Vianne Bulls, MD   75 mg at 06/06/22 0859   oxybutynin (DITROPAN) tablet 5 mg  5 mg Oral BID Opyd, Ilene Qua, MD   5 mg at 06/06/22 6808   oxyCODONE (Oxy IR/ROXICODONE) immediate release tablet 5 mg  5 mg Oral Q4H PRN Opyd, Ilene Qua, MD   5 mg at 06/05/22 0318   polyethylene glycol (MIRALAX / GLYCOLAX) packet 17 g  17 g Oral Daily PRN Opyd, Ilene Qua, MD       polyethylene glycol (MIRALAX / GLYCOLAX) packet 17 g  17 g Oral BID Charlynne Cousins, MD   17 g at 06/06/22 0901   sotalol (BETAPACE) tablet 120 mg  120 mg Oral BID Opyd, Ilene Qua, MD   120 mg at 06/06/22 0900   spironolactone (ALDACTONE) tablet 25 mg  25 mg Oral Daily Opyd, Ilene Qua, MD   25 mg at 06/06/22 0900   tiZANidine (ZANAFLEX) tablet 4 mg  4 mg Oral QHS PRN Opyd, Ilene Qua, MD   4 mg at 06/05/22 2154     Discharge Medications: Please see discharge summary for a list of discharge medications.  Relevant Imaging Results:  Relevant Lab Results:   Additional Information SSN: [place] [address], [place]

## 2022-06-06 NOTE — Progress Notes (Signed)
[facility] PROGRESS NOTE    Progress Note  Selena Bush  ZOX:[id number] DOB: 07-26-1946 DOA: 06/04/2022 PCP: Merryl Hacker, No     Brief Narrative:   Selena Bush is an 76 y.o. female past medical history significant for Parkinson disease, chronic diastolic heart failure paroxysmal atrial fibrillation not on anticoagulation due to history of hemorrhagic pericardial effusion and frequent falls into the ED with left hip and back pain after a mechanical fall.  X-ray of the pelvis, hip and shoulder were negative for acute fractures, CT of the head showed no acute findings.  CT C-spine was negative for acute fracture.  CT chest revealed minimal displaced posterior eighth rib fracture    Assessment/Plan:   Eight rib fracture: Relate her pain is not controlled. Physical therapy evaluated the patient, she will need short-term rehab. Continue narcotics for pain control, incentive spirometry and flutter valve have been ordered. Out of bed to chair, encourage a lot of incentive spirometry  Ambulatory dysfunction/debility: Patient has had progressive bilateral lower extremity weakness the family has been trying to work with the patient to try to go to rehab which she has been adamant. PT and Occupational Therapy have evaluated the patient, and she will benefit from short-term rehab.  Parkinson's disease: Probably contributing to her debility, continue Sinemet.  Chronic diastolic heart failure: No changes to her medication, on admission she appears euvolemic her medication has been continued.  Paroxysmal atrial fibrillation: Currently rate controlled continue sotalol not a candidate for anticoagulation due to multiple falls and history of hemorrhagic pericardial effusion.  Chronic kidney disease stage IIIb: Her creatinine appears to be at baseline.  DVT prophylaxis: Lovenox Family Communication:none Status is: Observation The patient remains OBS appropriate and will d/c before 2  midnights.    Code Status:     Code Status Orders  (From admission, onward)           Start     Ordered   06/04/22 2202  Full code  Continuous        06/04/22 2203           Code Status History     Date Active Date Inactive Code Status Order ID Comments User Context   11/12/2017 1745 11/20/2017 1604 Full Code [id number]  Ledell Noss, MD ED   11/04/2017 1013 11/10/2017 1734 Full Code [id number]  Molt, Romelle Starcher, DO ED   09/15/2014 1141 09/17/2014 1620 Full Code [id number]  Pricilla Loveless, PA-C Inpatient   08/19/2014 1202 08/19/2014 1912 Full Code [id number]  Pricilla Loveless, PA-C Inpatient   04/16/2013 1547 04/19/2013 1706 Full Code [id number]  Babish, Lucille Passy, PA-C Inpatient         IV Access:   Peripheral IV   Procedures and diagnostic studies:   DG Hip Unilat With Pelvis 2-3 Views Left  Result Date: 06/04/2022 CLINICAL DATA:  Pain post fall EXAM: DG HIP (WITH OR WITHOUT PELVIS) 2-3V LEFT COMPARISON:  None Available. FINDINGS: There is no evidence of hip fracture or dislocation. Lower lumbar facet DJD. There is no evidence of arthropathy or other focal bone abnormality. IMPRESSION: Negative. Electronically Signed   By: Lucrezia Europe M.D.   On: 06/04/2022 13:34   DG Shoulder Left  Result Date: 06/04/2022 CLINICAL DATA:  Pain post fall EXAM: LEFT SHOULDER - 2+ VIEW COMPARISON:  None available FINDINGS: Advanced glenohumeral DJD. No fracture or dislocation. Soft tissues are unremarkable. IMPRESSION: Advanced glenohumeral DJD. No acute findings. Electronically Signed   By: Eden Emms.D.  On: 06/04/2022 13:33   CT Lumbar Spine Wo Contrast  Result Date: 06/04/2022 CLINICAL DATA:  Pain post fall EXAM: CT THORACIC SPINE WITHOUT CONTRAST CT LUMBAR SPINE WITHOUT CONTRAST TECHNIQUE: Multidetector CT images of the thoracic and lumbar spine were obtained using the standard protocol without intravenous contrast. RADIATION DOSE REDUCTION: This exam was performed according to the  departmental dose-optimization program which includes automated exposure control, adjustment of the mA and/or kV according to patient size and/or use of iterative reconstruction technique. COMPARISON:  CT 09/20/2014 FINDINGS: Alignment: Normal Vertebrae: 12 rib-bearing thoracic segments assigned T1-T12, 5 non-rib-bearing lumbar segments L1-L5. Anterior endplate spurring at all levels T2-L5. Paraspinal and other soft tissues: No paraspinal hematoma. Minimally displaced fracture posterior aspect left eighth rib incidentally noted. Disc levels: C5-C7: Degenerative disc disease, incompletely. T8-T11: Mild narrowing of interspaces T11-L2: Mild vacuum phenomenon in the interspaces. L2-S1: Bilateral facet DJD. IMPRESSION: 1. Negative for thoracic or lumbar fracture. 2. Minimally displaced fracture posterior left eighth rib. 3. Multilevel degenerative changes as above. Electronically Signed   By: Lucrezia Europe M.D.   On: 06/04/2022 13:32   CT T-SPINE NO CHARGE  Result Date: 06/04/2022 CLINICAL DATA:  Pain post fall EXAM: CT THORACIC SPINE WITHOUT CONTRAST CT LUMBAR SPINE WITHOUT CONTRAST TECHNIQUE: Multidetector CT images of the thoracic and lumbar spine were obtained using the standard protocol without intravenous contrast. RADIATION DOSE REDUCTION: This exam was performed according to the departmental dose-optimization program which includes automated exposure control, adjustment of the mA and/or kV according to patient size and/or use of iterative reconstruction technique. COMPARISON:  CT 09/20/2014 FINDINGS: Alignment: Normal Vertebrae: 12 rib-bearing thoracic segments assigned T1-T12, 5 non-rib-bearing lumbar segments L1-L5. Anterior endplate spurring at all levels T2-L5. Paraspinal and other soft tissues: No paraspinal hematoma. Minimally displaced fracture posterior aspect left eighth rib incidentally noted. Disc levels: C5-C7: Degenerative disc disease, incompletely. T8-T11: Mild narrowing of interspaces T11-L2:  Mild vacuum phenomenon in the interspaces. L2-S1: Bilateral facet DJD. IMPRESSION: 1. Negative for thoracic or lumbar fracture. 2. Minimally displaced fracture posterior left eighth rib. 3. Multilevel degenerative changes as above. Electronically Signed   By: Lucrezia Europe M.D.   On: 06/04/2022 13:32   CT Chest Wo Contrast  Result Date: 06/04/2022 CLINICAL DATA:  Chest trauma, blunt, post fall EXAM: CT CHEST WITHOUT CONTRAST TECHNIQUE: Multidetector CT imaging of the chest was performed following the standard protocol without IV contrast. RADIATION DOSE REDUCTION: This exam was performed according to the departmental dose-optimization program which includes automated exposure control, adjustment of the mA and/or kV according to patient size and/or use of iterative reconstruction technique. COMPARISON:  09/20/2014 FINDINGS: Cardiovascular: Heart size normal. No pericardial effusion. Coronary calcifications. Atheromatous thoracic aorta. Mediastinum/Nodes: No mediastinal hematoma, mass or adenopathy.  Or Lungs/Pleura: No pleural effusion. No pneumothorax. Dependent subsegmental atelectasis at the lung bases. Linear platelike atelectasis in the right lower lobe. 1.7 cm thin-walled bleb in the superior segment right lower lobe. 5 mm pleural-based nodule, superior segment right lower lobe (Im63,Se5) . Upper Abdomen: No acute findings. Musculoskeletal: Left shoulder DJD. Anterior vertebral endplate spurring at multiple levels in the mid and lower thoracic spine. Multiple old right rib fracture deformities. Acute minimally displaced fracture, posterior aspect left eighth rib (Im25,Se3). IMPRESSION: 1. Acute posterior left  eighth rib fracture. No pneumothorax. 2. Coronary and aortic Atherosclerosis (ICD10-170.0). 3. 5 mm right solid pulmonary nodule within the lower lobe. Per Fleischner Society Guidelines, no routine follow-up imaging is recommended. These guidelines do not apply to immunocompromised patients and patients  with cancer. Follow up in patients with significant comorbidities as clinically warranted. Reference: Radiology. 2017; 284(1):228-43. Electronically Signed   By: Lucrezia Europe M.D.   On: 06/04/2022 13:22   CT Cervical Spine Wo Contrast  Result Date: 06/04/2022 CLINICAL DATA:  Trauma, fall EXAM: CT CERVICAL SPINE WITHOUT CONTRAST TECHNIQUE: Multidetector CT imaging of the cervical spine was performed without intravenous contrast. Multiplanar CT image reconstructions were also generated. RADIATION DOSE REDUCTION: This exam was performed according to the departmental dose-optimization program which includes automated exposure control, adjustment of the mA and/or kV according to patient size and/or use of iterative reconstruction technique. COMPARISON:  02/02/2020 FINDINGS: Alignment: Alignment of posterior margins of vertebral bodies is within normal limits. Skull base and vertebrae: No recent fracture is seen. Soft tissues and spinal canal: There is extrinsic pressure over the ventral margin of thecal sac caused by posterior bony spurs and bulging of annulus, more so at C5-C6 level with mild spinal stenosis. Disc levels: There is encroachment of neural foramina from C2-C7 levels, more severe at C5-C6 and C6-C7 levels. Upper chest: Unremarkable. Other: There is mucosal thickening in right maxillary sinus. IMPRESSION: No recent fracture is seen in cervical spine. Cervical spondylosis with encroachment of neural foramina at multiple levels, more so at C5-C6 and C6-C7 levels. Chronic sinusitis. Electronically Signed   By: Elmer Picker M.D.   On: 06/04/2022 13:09   CT Head Wo Contrast  Result Date: 06/04/2022 CLINICAL DATA:  Trauma, fall EXAM: CT HEAD WITHOUT CONTRAST TECHNIQUE: Contiguous axial images were obtained from the base of the skull through the vertex without intravenous contrast. RADIATION DOSE REDUCTION: This exam was performed according to the departmental dose-optimization program which includes  automated exposure control, adjustment of the mA and/or kV according to patient size and/or use of iterative reconstruction technique. COMPARISON:  02/02/2020 FINDINGS: Brain: Ventricles are not dilated. There is no shift of midline structures. Ventricles are not dilated. There is decreased density in basal ganglia, periventricular white matter and subcortical white matter. Cortical sulci are slightly prominent. Vascular: Scattered arterial calcifications are seen. Skull: No fracture is seen. Sinuses/Orbits: There is opacification of right side of sphenoid sinus. There is mucosal thickening in ethmoid sinus. Other: None. IMPRESSION: No acute intracranial findings are seen in noncontrast CT brain. Atrophy. Small-vessel disease. Chronic sinusitis. Electronically Signed   By: Elmer Picker M.D.   On: 06/04/2022 13:05     Medical Consultants:   None.   Subjective:    [name] relate her pain is not controlled has not had a bowel movement.  Objective:    Vitals:   06/05/22 2055 06/05/22 2356 06/06/22 0530 06/06/22 0756  BP: 107/61 (!) 101/59 (!) 145/92 137/82  Pulse: 73 (!) 56 62 61  Resp: '18 17 17 17  '$ Temp: 98.2 F (36.8 C) 98.3 F (36.8 C) 98.3 F (36.8 C) 98 F (36.7 C)  TempSrc: Oral Oral Oral Oral  SpO2: 93% 96% 96% 94%   SpO2: 94 %   Intake/Output Summary (Last 24 hours) at 06/06/2022 0825 Last data filed at 06/06/2022 7858 Gross per 24 hour  Intake 240 ml  Output --  Net 240 ml   There were no vitals filed for this visit.  Exam: General exam: In no acute distress. Respiratory system: Good air movement and clear to auscultation. Cardiovascular system: S1 & S2 heard, RRR. No JVD. Gastrointestinal system: Abdomen is nondistended, soft and nontender.  Extremities: No pedal edema. Skin: No rashes, lesions or ulcers Psychiatry: Judgement and  insight appear normal. Mood & affect appropriate.   Data Reviewed:    Labs: Basic Metabolic Panel: Recent Labs   Lab 06/04/22 1949 06/05/22 0203  NA 138 137  K 3.9 3.7  CL 100 104  CO2 29 26  GLUCOSE 102* 95  BUN 17 20  CREATININE 1.02* 0.98  CALCIUM 10.0 9.6    GFR CrCl cannot be calculated (Unknown ideal weight.). Liver Function Tests: No results for input(s): "AST", "ALT", "ALKPHOS", "BILITOT", "PROT", "ALBUMIN" in the last 168 hours. No results for input(s): "LIPASE", "AMYLASE" in the last 168 hours. No results for input(s): "AMMONIA" in the last 168 hours. Coagulation profile No results for input(s): "INR", "PROTIME" in the last 168 hours. COVID-19 Labs  No results for input(s): "DDIMER", "FERRITIN", "LDH", "CRP" in the last 72 hours.  Lab Results  Component Value Date   [place] NEGATIVE 06/06/2021    CBC: Recent Labs  Lab 06/04/22 1949 06/05/22 0203  WBC 10.4 8.9  NEUTROABS 7.9*  --   HGB 14.3 13.6  HCT 43.5 42.6  MCV 98.9 99.8  PLT 202 215    Cardiac Enzymes: No results for input(s): "CKTOTAL", "CKMB", "CKMBINDEX", "TROPONINI" in the last 168 hours. BNP (last 3 results) No results for input(s): "PROBNP" in the last 8760 hours. CBG: No results for input(s): "GLUCAP" in the last 168 hours. D-Dimer: No results for input(s): "DDIMER" in the last 72 hours. Hgb A1c: No results for input(s): "HGBA1C" in the last 72 hours. Lipid Profile: No results for input(s): "CHOL", "HDL", "LDLCALC", "TRIG", "CHOLHDL", "LDLDIRECT" in the last 72 hours. Thyroid function studies: No results for input(s): "TSH", "T4TOTAL", "T3FREE", "THYROIDAB" in the last 72 hours.  Invalid input(s): "FREET3" Anemia work up: No results for input(s): "VITAMINB12", "FOLATE", "FERRITIN", "TIBC", "IRON", "RETICCTPCT" in the last 72 hours. Sepsis Labs: Recent Labs  Lab 06/04/22 1949 06/05/22 0203  WBC 10.4 8.9    Microbiology No results found for this or any previous visit (from the past 240 hour(s)).   Medications:    carbidopa-levodopa  1 tablet Oral 3 times per day    carbidopa-levodopa  2 tablet Oral 3 times per day   DULoxetine  60 mg Oral BID   enoxaparin (LOVENOX) injection  40 mg Subcutaneous Q24H   gabapentin  600 mg Oral QHS   lidocaine  1 patch Transdermal Q24H   melatonin  3 mg Oral QHS   metoprolol tartrate  75 mg Oral BID   oxybutynin  5 mg Oral BID   sotalol  120 mg Oral BID   spironolactone  25 mg Oral Daily   Continuous Infusions:    LOS: 0 days   Charlynne Cousins  [facility]  06/06/2022, 8:25 AM

## 2022-06-06 NOTE — Progress Notes (Signed)
Chatted Dr. Olevia Bowens about Extended release Carb/Levo med pt takes on the PTA meds list. Family was asking about why she was not on it.

## 2022-06-07 DIAGNOSIS — I480 Paroxysmal atrial fibrillation: Secondary | ICD-10-CM | POA: Diagnosis not present

## 2022-06-07 DIAGNOSIS — N1831 Chronic kidney disease, stage 3a: Secondary | ICD-10-CM | POA: Diagnosis not present

## 2022-06-07 DIAGNOSIS — I5032 Chronic diastolic (congestive) heart failure: Secondary | ICD-10-CM | POA: Diagnosis not present

## 2022-06-07 DIAGNOSIS — S2232XA Fracture of one rib, left side, initial encounter for closed fracture: Secondary | ICD-10-CM | POA: Diagnosis not present

## 2022-06-07 NOTE — Progress Notes (Signed)
[facility] PROGRESS NOTE    Progress Note  Selena Bush  STM:[id number] DOB: 06/07/1946 DOA: 06/04/2022 PCP: Merryl Hacker, No     Brief Narrative:   Selena Bush is an 76 y.o. female past medical history significant for Parkinson disease, chronic diastolic heart failure paroxysmal atrial fibrillation not on anticoagulation due to history of hemorrhagic pericardial effusion and frequent falls into the ED with left hip and back pain after a mechanical fall.  X-ray of the pelvis, hip and shoulder were negative for acute fractures, CT of the head showed no acute findings.  CT C-spine was negative for acute fracture.  CT chest revealed minimal displaced posterior eighth rib fracture.  Patient is medically stable awaiting for skilled nursing facility placement.   Assessment/Plan:   Eight rib fracture: Relate her pain is controlled. Physical therapy evaluated the patient, she will need short-term rehab. Continue narcotics for pain control, incentive spirometry and flutter valve have been ordered. Out of bed to chair, encourage a lot of incentive spirometry  Ambulatory dysfunction/debility: Patient has had progressive bilateral lower extremity weakness the family has been trying to work with the patient to try to go to rehab which she has been adamant. PT and Occupational Therapy have evaluated the patient, and she will benefit from short-term rehab. Now she agreed to rehab.  Parkinson's disease: Probably contributing to her debility, continue Sinemet.  Chronic diastolic heart failure: No changes to her medication, on admission she appears euvolemic her medication has been continued.  Paroxysmal atrial fibrillation: Currently rate controlled continue sotalol not a candidate for anticoagulation due to multiple falls and history of hemorrhagic pericardial effusion.  Chronic kidney disease stage IIIb: Her creatinine appears to be at baseline.  DVT prophylaxis: Lovenox Family  Communication:none Status is: Observation The patient remains OBS appropriate and will d/c before 2 midnights.    Code Status:     Code Status Orders  (From admission, onward)           Start     Ordered   06/04/22 2202  Full code  Continuous        06/04/22 2203           Code Status History     Date Active Date Inactive Code Status Order ID Comments User Context   11/12/2017 1745 11/20/2017 1604 Full Code [id number]  Ledell Noss, MD ED   11/04/2017 1013 11/10/2017 1734 Full Code [id number]  Molt, Romelle Starcher, DO ED   09/15/2014 1141 09/17/2014 1620 Full Code [id number]  Pricilla Loveless, PA-C Inpatient   08/19/2014 1202 08/19/2014 1912 Full Code [id number]  Pricilla Loveless, PA-C Inpatient   04/16/2013 1547 04/19/2013 1706 Full Code [id number]  Babish, Lucille Passy, PA-C Inpatient         IV Access:   Peripheral IV   Procedures and diagnostic studies:   No results found.   Medical Consultants:   None.   Subjective:    Selena Bush pain is controlled has not had a bowel movement.  Objective:    Vitals:   06/06/22 2049 06/07/22 0012 06/07/22 0529 06/07/22 0917  BP: 134/76  104/60 103/63  Pulse: 69 72 (!) 53 (!) 59  Resp: '17 20 16 17  '$ Temp: 97.9 F (36.6 C)  98.6 F (37 C)   TempSrc: Oral  Oral   SpO2: 95% 98% 96% 96%   SpO2: 96 %   Intake/Output Summary (Last 24 hours) at 06/07/2022 1031 Last data filed at 06/06/2022 1500 Gross per 24  hour  Intake 340 ml  Output --  Net 340 ml    There were no vitals filed for this visit.  Exam: General exam: In no acute distress. Respiratory system: Good air movement and clear to auscultation. Cardiovascular system: S1 & S2 heard, RRR. No JVD. Gastrointestinal system: Abdomen is nondistended, soft and nontender.  Extremities: No pedal edema. Skin: No rashes, lesions or ulcers Psychiatry: Judgement and insight appear normal. Mood & affect appropriate.  Data Reviewed:    Labs: Basic Metabolic  Panel: Recent Labs  Lab 06/04/22 1949 06/05/22 0203  NA 138 137  K 3.9 3.7  CL 100 104  CO2 29 26  GLUCOSE 102* 95  BUN 17 20  CREATININE 1.02* 0.98  CALCIUM 10.0 9.6    GFR CrCl cannot be calculated (Unknown ideal weight.). Liver Function Tests: No results for input(s): "AST", "ALT", "ALKPHOS", "BILITOT", "PROT", "ALBUMIN" in the last 168 hours. No results for input(s): "LIPASE", "AMYLASE" in the last 168 hours. No results for input(s): "AMMONIA" in the last 168 hours. Coagulation profile No results for input(s): "INR", "PROTIME" in the last 168 hours. COVID-19 Labs  No results for input(s): "DDIMER", "FERRITIN", "LDH", "CRP" in the last 72 hours.  Lab Results  Component Value Date   [place] NEGATIVE 06/06/2021    CBC: Recent Labs  Lab 06/04/22 1949 06/05/22 0203  WBC 10.4 8.9  NEUTROABS 7.9*  --   HGB 14.3 13.6  HCT 43.5 42.6  MCV 98.9 99.8  PLT 202 215    Cardiac Enzymes: No results for input(s): "CKTOTAL", "CKMB", "CKMBINDEX", "TROPONINI" in the last 168 hours. BNP (last 3 results) No results for input(s): "PROBNP" in the last 8760 hours. CBG: No results for input(s): "GLUCAP" in the last 168 hours. D-Dimer: No results for input(s): "DDIMER" in the last 72 hours. Hgb A1c: No results for input(s): "HGBA1C" in the last 72 hours. Lipid Profile: No results for input(s): "CHOL", "HDL", "LDLCALC", "TRIG", "CHOLHDL", "LDLDIRECT" in the last 72 hours. Thyroid function studies: No results for input(s): "TSH", "T4TOTAL", "T3FREE", "THYROIDAB" in the last 72 hours.  Invalid input(s): "FREET3" Anemia work up: No results for input(s): "VITAMINB12", "FOLATE", "FERRITIN", "TIBC", "IRON", "RETICCTPCT" in the last 72 hours. Sepsis Labs: Recent Labs  Lab 06/04/22 1949 06/05/22 0203  WBC 10.4 8.9    Microbiology No results found for this or any previous visit (from the past 240 hour(s)).   Medications:    carbidopa-levodopa  1 tablet Oral 3 times per  day   carbidopa-levodopa  2 tablet Oral 3 times per day   DULoxetine  60 mg Oral BID   enoxaparin (LOVENOX) injection  40 mg Subcutaneous Q24H   gabapentin  600 mg Oral QHS   lidocaine  1 patch Transdermal Q24H   melatonin  3 mg Oral QHS   metoprolol tartrate  75 mg Oral BID   oxybutynin  5 mg Oral BID   polyethylene glycol  17 g Oral BID   sotalol  120 mg Oral BID   spironolactone  25 mg Oral Daily   Continuous Infusions:    LOS: 0 days   Charlynne Cousins  [facility]  06/07/2022, 10:31 AM

## 2022-06-07 NOTE — Plan of Care (Signed)
  Problem: Education: Goal: Knowledge of General Education information will improve Description Including pain rating scale, medication(s)/side effects and non-pharmacologic comfort measures Outcome: Progressing   Problem: Health Behavior/Discharge Planning: Goal: Ability to manage health-related needs will improve Outcome: Progressing   

## 2022-06-07 NOTE — TOC Progression Note (Addendum)
Transition of Care [facility]) - Progression Note    Patient Details  Name: SABINA BEAVERS MRN: [id number] Date of Birth: [birth year]  Transition of Care [facility]) CM/SW [place], [place] Phone Number: 06/07/2022, 12:25 PM  Clinical Narrative:    CSW spoke with pt and family friend at bedside, [place] explained to pt that she only had one SNF offer that is contracted through [facility] and that is [facility]. Pt accepted and let her daughter know via phone.  CSW contacted Birdie Hopes at [facility], she stated she will get transportation set up for DC when pt is medically stable. CSW will follow up with Briana when DC summary is complete.   Pt daughter [name] contacted CSW and asked [facility] could be an option. Pt lives in [place] and her caregiver lives with her, [facility] would be closer than [facility].  CSW contacted Kitty at [facility], she is able to accept but would like to speak with [facility] case worker first. CSW contacted [facility] case worker and left a message to contact [place]. CSW will continue to follow.    Expected Discharge Plan: [place] Barriers to Discharge: Continued Medical Work up  Expected Discharge Plan and Services Expected Discharge Plan: [place] In-house Referral: Clinical Social Work   Post Acute Care Choice: [place] Living arrangements for the past 2 months: Single Family Home                                       Social Determinants of Health (SDOH) Interventions    Readmission Risk Interventions     No data to display

## 2022-06-07 NOTE — Progress Notes (Signed)
Pt placed on CPAP to rest for the night. Patient resting comfortably with svs.

## 2022-06-07 NOTE — TOC CAGE-AID Note (Signed)
Transition of Care [facility]) - CAGE-AID Screening   Patient Details  Name: Selena Bush MRN: [id number] Date of Birth: May 03, 1946  Clinical Narrative:  Patient admitted after a GLF at home. Patient denies any alcohol or illicit drug use, no need for resources at this time.  CAGE-AID Screening:    Have You Ever Felt You Ought to Cut Down on Your Drinking or Drug Use?: No Have People Annoyed You By Critizing Your Drinking Or Drug Use?: No Have You Felt Bad Or Guilty About Your Drinking Or Drug Use?: No Have You Ever Had a Drink or Used Drugs First Thing In The Morning to Steady Your Nerves or to Get Rid of a Hangover?: No CAGE-AID Score: 0  Substance Abuse Education Offered: No

## 2022-06-07 NOTE — Progress Notes (Signed)
Physical Therapy Treatment Patient Details Name: Selena Bush MRN: [id number] DOB: 1945-12-16 Today's Date: 06/07/2022   History of Present Illness Pt is a 76 y/o female admitted secondary to fall. Pt found to have L 8th rib fx. PMH includes dCHF, a fib, parkinson's and CKD.    PT Comments    Pt was seen for abbreviated effort to stand but with wgt shifting help from PT did sidestep a few steps.  Pt is interested in recovering gait but there is concern about her getting to be normally able to walk.  Follow acutely for advancing standing control, to increase safety of standing and to work on steps with all directions  achieved.  Pt is weak and cannot stand without almost dependent support.  Daughter is present and asking about her therapy follow up .  Will still recommend SNF due to depth of deficits including zero standing balance and her low endurance for being on her feet.   Recommendations for follow up therapy are one component of a multi-disciplinary discharge planning process, led by the attending physician.  Recommendations may be updated based on patient status, additional functional criteria and insurance authorization.  Follow Up Recommendations  Skilled nursing-short term rehab (<3 hours/day) Can patient physically be transported by private vehicle: No   Assistance Recommended at Discharge Frequent or constant Supervision/Assistance  Patient can return home with the following A lot of help with walking and/or transfers;A lot of help with bathing/dressing/bathroom;Assistance with cooking/housework;Assist for transportation;Help with stairs or ramp for entrance   Equipment Recommendations  None recommended by PT    Recommendations for Other Services       Precautions / Restrictions Precautions Precautions: Fall Precaution Comments: Parkinson's Restrictions Weight Bearing Restrictions: No     Mobility  Bed Mobility Overal bed mobility: Needs Assistance Bed Mobility:  Supine to Sit, Sit to Supine     Supine to sit: Mod assist Sit to supine: Max assist   General bed mobility comments: assisted for trunk support up and used bed pad to support a scooting effort    Transfers Overall transfer level: Needs assistance Equipment used: Rolling walker (2 wheels), 1 person hand held assist Transfers: Sit to/from Stand Sit to Stand: Max assist                Ambulation/Gait Ambulation/Gait assistance: Max Web designer (Feet): 4 Feet Assistive device: 1 person hand held assist Gait Pattern/deviations: Step-to pattern, Decreased dorsiflexion - right, Decreased stride length, Decreased dorsiflexion - left Gait velocity: reduced Gait velocity interpretation: <1.31 ft/sec, indicative of household ambulator Pre-gait activities: standing balance ck General Gait Details: sidesteps in a squatted posture   Stairs             Wheelchair Mobility    Modified Rankin (Stroke Patients Only)       Balance Overall balance assessment: Needs assistance Sitting-balance support: Feet supported, Bilateral upper extremity supported Sitting balance-Leahy Scale: Poor     Standing balance support: Bilateral upper extremity supported, During functional activity Standing balance-Leahy Scale: Zero                              Cognition Arousal/Alertness: Awake/alert Behavior During Therapy: WFL for tasks assessed/performed Overall Cognitive Status: Impaired/Different from baseline Area of Impairment: Problem solving, Awareness, Safety/judgement, Following commands                       Following Commands: Follows  one step commands with increased time Safety/Judgement: Decreased awareness of deficits, Decreased awareness of safety Awareness: Intellectual Problem Solving: Slow processing, Requires verbal cues          Exercises      General Comments General comments (skin integrity, edema, etc.): requires extensive  support to sidestep with PT directly assisting her      Pertinent Vitals/Pain Pain Assessment Pain Assessment: Faces Faces Pain Scale: Hurts little more Pain Location: L ribcage Pain Descriptors / Indicators: Guarding Pain Intervention(s): Monitored during session, Repositioned    Home Living                          Prior Function            PT Goals (current goals can now be found in the care plan section) Acute Rehab PT Goals Patient Stated Goal: to get stronger    Frequency    Min 2X/week      PT Plan Current plan remains appropriate    Co-evaluation              AM-PAC PT "6 Clicks" Mobility   Outcome Measure  Help needed turning from your back to your side while in a flat bed without using bedrails?: A Lot Help needed moving from lying on your back to sitting on the side of a flat bed without using bedrails?: A Lot Help needed moving to and from a bed to a chair (including a wheelchair)?: A Lot Help needed standing up from a chair using your arms (e.g., wheelchair or bedside chair)?: Total Help needed to walk in hospital room?: Total Help needed climbing 3-5 steps with a railing? : Total 6 Click Score: 9    End of Session   Activity Tolerance: Patient limited by lethargy;Patient limited by fatigue Patient left: in bed;with call bell/phone within reach Nurse Communication: Mobility status PT Visit Diagnosis: Other abnormalities of gait and mobility (R26.89);History of falling (Z91.81);Muscle weakness (generalized) (M62.81);Unsteadiness on feet (R26.81);Difficulty in walking, not elsewhere classified (R26.2)     Time: 4097-3532 PT Time Calculation (min) (ACUTE ONLY): 33 min  Charges:  $Therapeutic Activity: 23-37 mins      Ramond Dial 06/07/2022, 3:05 PM  Mee Hives, PT PhD Acute Rehab Dept. Number: [place] and [place]

## 2022-06-08 DIAGNOSIS — S2232XA Fracture of one rib, left side, initial encounter for closed fracture: Secondary | ICD-10-CM | POA: Diagnosis not present

## 2022-06-08 DIAGNOSIS — N1831 Chronic kidney disease, stage 3a: Secondary | ICD-10-CM | POA: Diagnosis not present

## 2022-06-08 DIAGNOSIS — I5032 Chronic diastolic (congestive) heart failure: Secondary | ICD-10-CM | POA: Diagnosis not present

## 2022-06-08 DIAGNOSIS — I480 Paroxysmal atrial fibrillation: Secondary | ICD-10-CM | POA: Diagnosis not present

## 2022-06-08 NOTE — Discharge Summary (Addendum)
Physician Discharge Summary  Selena Bush ZDG:[id number] DOB: [birth year] DOA: 06/04/2022  PCP: Pcp, No  Admit date: 06/04/2022 Discharge date: 06/08/2022  Admitted From: Home Disposition:  SNF  Recommendations for Outpatient Follow-up:  Follow up with PCP in 1-2 weeks Please obtain BMP/CBC in one week   Home Health:No Equipment/Devices:None  Discharge Condition:Stable CODE STATUS:Full Diet recommendation: Heart Healthy   Brief/Interim Summary: 76 y.o. female past medical history significant for Parkinson disease, chronic diastolic heart failure paroxysmal atrial fibrillation not on anticoagulation due to history of hemorrhagic pericardial effusion and frequent falls into the ED with left hip and back pain after a mechanical fall.  X-ray of the pelvis, hip and shoulder were negative for acute fractures, CT of the head showed no acute findings.  CT C-spine was negative for acute fracture.  CT chest revealed minimal displaced posterior eighth rib fracture.  Patient is medically stable awaiting for skilled nursing facility placement.  Discharge Diagnoses:  Principal Problem:   Rib fracture Active Problems:   Chronic diastolic CHF (congestive heart failure) (HCC)   Paroxysmal atrial fibrillation (HCC)   Parkinson's disease   Stage 3a chronic kidney disease (CKD) (HCC)   Pressure injury of skin  Ambulatory dysfunction and debility leading to eighth rib fracture: She was started on Tylenol which controlled her pain and she was started on a bowel regimen and had regular bowel movements. Physical therapy evaluated the patient recommended short-term rehab.  Parkinson disease: No change made to her medication continue current regimen.  Chronic diastolic heart failure: No changes made to her medication she appears euvolemic.  Paroxysmal atrial fibrillation: Continue sotalol not a candidate for anticoagulation due to multiple frequent falls and history of hemorrhagic pericardial  effusion.  Chronic kidney stage IIIb: Her creatinine remained at baseline.  Discharge Instructions  Discharge Instructions     Diet - low sodium heart healthy   Complete by: As directed    Increase activity slowly   Complete by: As directed    No wound care   Complete by: As directed       Allergies as of 06/08/2022       Reactions   Penicillins Anaphylaxis   anaphylaxis  Has patient had a PCN reaction causing immediate rash, facial/tongue/throat swelling, SOB or lightheadedness with hypotension: Yes Has patient had a PCN reaction causing severe rash involving mucus membranes or skin necrosis: No Has patient had a PCN reaction that required hospitalization: No Has patient had a PCN reaction occurring within the last 10 years: No If all of the above answers are "NO", then may proceed with Cephalosporin use.   Sulfa Antibiotics Anaphylaxis   Codeine Other (See Comments), Nausea And Vomiting   Gi problems   Statins Other (See Comments)   Leg pains   Xarelto [rivaroxaban] Swelling   pericarditis   Aspirin Other (See Comments)   "burned my stomach intensely" Abdominal pain and burning        Medication List     TAKE these medications    acetaminophen 500 MG tablet Commonly known as: TYLENOL Take 500 mg by mouth every 6 (six) hours as needed for moderate pain.   Carbidopa-Levodopa ER 25-100 MG tablet controlled release Commonly known as: SINEMET CR Take 1 tab at 9pm   carbidopa-levodopa 25-100 MG tablet Commonly known as: SINEMET IR Take 2 tablets at 6 AM, 1 at 9AM, 1 tab at 12 PM,  2 at 3 PM, 1 at 6 PM and 2 at 9 PM.   DULoxetine  60 MG capsule Commonly known as: CYMBALTA TAKE ONE CAPSULE BY MOUTH TWICE DAILY FOR anxiety AND depression What changed: See the new instructions.   ezetimibe 10 MG tablet Commonly known as: ZETIA TAKE ONE TABLET BY MOUTH EVERY MORNING What changed: when to take this   fluticasone 50 MCG/ACT nasal spray Commonly known as:  FLONASE Place 2 sprays into both nostrils daily as needed for rhinitis or allergies.   furosemide 20 MG tablet Commonly known as: LASIX TAKE ONE TABLET BY MOUTH ONCE DAILY May take additional tablet daily if needed What changed:  See the new instructions. Another medication with the same name was removed. Continue taking this medication, and follow the directions you see here.   gabapentin 600 MG tablet Commonly known as: NEURONTIN Take 600 mg by mouth at bedtime.   hydrocortisone 2.5 % cream Apply to affected under breasts 1-2 times a day as directed and as needed for itch. Can also use on face as needed for itch   Jardiance 10 MG Tabs tablet Generic drug: empagliflozin TAKE ONE TABLET BY MOUTH BEFORE BREAKFAST What changed: See the new instructions.   ketoconazole 2 % cream Commonly known as: NIZORAL Apply to affected areas under breast twice daily for rash until healed. Can also use on face as needed for dermatitis   metoprolol tartrate 25 MG tablet Commonly known as: LOPRESSOR TAKE THREE TABLETS BY MOUTH TWICE DAILY   nystatin cream Commonly known as: MYCOSTATIN Apply 1 Application topically 2 (two) times daily.   oxybutynin 5 MG tablet Commonly known as: DITROPAN Take 5 mg by mouth 2 (two) times daily.   sotalol 120 MG tablet Commonly known as: BETAPACE TAKE ONE TABLET BY MOUTH TWICE DAILY   spironolactone 25 MG tablet Commonly known as: ALDACTONE Take 1 tablet (25 mg total) by mouth daily.   tiZANidine 4 MG tablet Commonly known as: ZANAFLEX TAKE 2 TABLET (4 MG TOTAL) BY MOUTH DAILY FOR MUSCLE SPASMS AT BEDTIME. What changed:  how much to take how to take this when to take this additional instructions   Vitamin B-12 5000 MCG Subl Place 1 each under the tongue daily.   Vitamin D-3 25 MCG (1000 UT) Caps Take 1 capsule by mouth daily.        Allergies  Allergen Reactions   Penicillins Anaphylaxis    anaphylaxis  Has patient had a PCN reaction  causing immediate rash, facial/tongue/throat swelling, SOB or lightheadedness with hypotension: Yes Has patient had a PCN reaction causing severe rash involving mucus membranes or skin necrosis: No Has patient had a PCN reaction that required hospitalization: No Has patient had a PCN reaction occurring within the last 10 years: No If all of the above answers are "NO", then may proceed with Cephalosporin use.    Sulfa Antibiotics Anaphylaxis   Codeine Other (See Comments) and Nausea And Vomiting    Gi problems    Statins Other (See Comments)    Leg pains   Xarelto [Rivaroxaban] Swelling    pericarditis   Aspirin Other (See Comments)    "burned my stomach intensely" Abdominal pain and burning    Consultations: None   Procedures/Studies: DG Hip Unilat With Pelvis 2-3 Views Left  Result Date: 06/04/2022 CLINICAL DATA:  Pain post fall EXAM: DG HIP (WITH OR WITHOUT PELVIS) 2-3V LEFT COMPARISON:  None Available. FINDINGS: There is no evidence of hip fracture or dislocation. Lower lumbar facet DJD. There is no evidence of arthropathy or other focal bone abnormality. IMPRESSION: Negative. Electronically  Signed   By: Lucrezia Europe M.D.   On: 06/04/2022 13:34   DG Shoulder Left  Result Date: 06/04/2022 CLINICAL DATA:  Pain post fall EXAM: LEFT SHOULDER - 2+ VIEW COMPARISON:  None available FINDINGS: Advanced glenohumeral DJD. No fracture or dislocation. Soft tissues are unremarkable. IMPRESSION: Advanced glenohumeral DJD. No acute findings. Electronically Signed   By: Lucrezia Europe M.D.   On: 06/04/2022 13:33   CT Lumbar Spine Wo Contrast  Result Date: 06/04/2022 CLINICAL DATA:  Pain post fall EXAM: CT THORACIC SPINE WITHOUT CONTRAST CT LUMBAR SPINE WITHOUT CONTRAST TECHNIQUE: Multidetector CT images of the thoracic and lumbar spine were obtained using the standard protocol without intravenous contrast. RADIATION DOSE REDUCTION: This exam was performed according to the departmental  dose-optimization program which includes automated exposure control, adjustment of the mA and/or kV according to patient size and/or use of iterative reconstruction technique. COMPARISON:  CT 09/20/2014 FINDINGS: Alignment: Normal Vertebrae: 12 rib-bearing thoracic segments assigned T1-T12, 5 non-rib-bearing lumbar segments L1-L5. Anterior endplate spurring at all levels T2-L5. Paraspinal and other soft tissues: No paraspinal hematoma. Minimally displaced fracture posterior aspect left eighth rib incidentally noted. Disc levels: C5-C7: Degenerative disc disease, incompletely. T8-T11: Mild narrowing of interspaces T11-L2: Mild vacuum phenomenon in the interspaces. L2-S1: Bilateral facet DJD. IMPRESSION: 1. Negative for thoracic or lumbar fracture. 2. Minimally displaced fracture posterior left eighth rib. 3. Multilevel degenerative changes as above. Electronically Signed   By: Lucrezia Europe M.D.   On: 06/04/2022 13:32   CT T-SPINE NO CHARGE  Result Date: 06/04/2022 CLINICAL DATA:  Pain post fall EXAM: CT THORACIC SPINE WITHOUT CONTRAST CT LUMBAR SPINE WITHOUT CONTRAST TECHNIQUE: Multidetector CT images of the thoracic and lumbar spine were obtained using the standard protocol without intravenous contrast. RADIATION DOSE REDUCTION: This exam was performed according to the departmental dose-optimization program which includes automated exposure control, adjustment of the mA and/or kV according to patient size and/or use of iterative reconstruction technique. COMPARISON:  CT 09/20/2014 FINDINGS: Alignment: Normal Vertebrae: 12 rib-bearing thoracic segments assigned T1-T12, 5 non-rib-bearing lumbar segments L1-L5. Anterior endplate spurring at all levels T2-L5. Paraspinal and other soft tissues: No paraspinal hematoma. Minimally displaced fracture posterior aspect left eighth rib incidentally noted. Disc levels: C5-C7: Degenerative disc disease, incompletely. T8-T11: Mild narrowing of interspaces T11-L2: Mild vacuum  phenomenon in the interspaces. L2-S1: Bilateral facet DJD. IMPRESSION: 1. Negative for thoracic or lumbar fracture. 2. Minimally displaced fracture posterior left eighth rib. 3. Multilevel degenerative changes as above. Electronically Signed   By: Lucrezia Europe M.D.   On: 06/04/2022 13:32   CT Chest Wo Contrast  Result Date: 06/04/2022 CLINICAL DATA:  Chest trauma, blunt, post fall EXAM: CT CHEST WITHOUT CONTRAST TECHNIQUE: Multidetector CT imaging of the chest was performed following the standard protocol without IV contrast. RADIATION DOSE REDUCTION: This exam was performed according to the departmental dose-optimization program which includes automated exposure control, adjustment of the mA and/or kV according to patient size and/or use of iterative reconstruction technique. COMPARISON:  09/20/2014 FINDINGS: Cardiovascular: Heart size normal. No pericardial effusion. Coronary calcifications. Atheromatous thoracic aorta. Mediastinum/Nodes: No mediastinal hematoma, mass or adenopathy.  Or Lungs/Pleura: No pleural effusion. No pneumothorax. Dependent subsegmental atelectasis at the lung bases. Linear platelike atelectasis in the right lower lobe. 1.7 cm thin-walled bleb in the superior segment right lower lobe. 5 mm pleural-based nodule, superior segment right lower lobe (Im63,Se5) . Upper Abdomen: No acute findings. Musculoskeletal: Left shoulder DJD. Anterior vertebral endplate spurring at multiple levels in the mid  and lower thoracic spine. Multiple old right rib fracture deformities. Acute minimally displaced fracture, posterior aspect left eighth rib (Im25,Se3). IMPRESSION: 1. Acute posterior left  eighth rib fracture. No pneumothorax. 2. Coronary and aortic Atherosclerosis (ICD10-170.0). 3. 5 mm right solid pulmonary nodule within the lower lobe. Per Fleischner Society Guidelines, no routine follow-up imaging is recommended. These guidelines do not apply to immunocompromised patients and patients with  cancer. Follow up in patients with significant comorbidities as clinically warranted. Reference: Radiology. 2017; 284(1):228-43. Electronically Signed   By: Lucrezia Europe M.D.   On: 06/04/2022 13:22   CT Cervical Spine Wo Contrast  Result Date: 06/04/2022 CLINICAL DATA:  Trauma, fall EXAM: CT CERVICAL SPINE WITHOUT CONTRAST TECHNIQUE: Multidetector CT imaging of the cervical spine was performed without intravenous contrast. Multiplanar CT image reconstructions were also generated. RADIATION DOSE REDUCTION: This exam was performed according to the departmental dose-optimization program which includes automated exposure control, adjustment of the mA and/or kV according to patient size and/or use of iterative reconstruction technique. COMPARISON:  02/02/2020 FINDINGS: Alignment: Alignment of posterior margins of vertebral bodies is within normal limits. Skull base and vertebrae: No recent fracture is seen. Soft tissues and spinal canal: There is extrinsic pressure over the ventral margin of thecal sac caused by posterior bony spurs and bulging of annulus, more so at C5-C6 level with mild spinal stenosis. Disc levels: There is encroachment of neural foramina from C2-C7 levels, more severe at C5-C6 and C6-C7 levels. Upper chest: Unremarkable. Other: There is mucosal thickening in right maxillary sinus. IMPRESSION: No recent fracture is seen in cervical spine. Cervical spondylosis with encroachment of neural foramina at multiple levels, more so at C5-C6 and C6-C7 levels. Chronic sinusitis. Electronically Signed   By: Elmer Picker M.D.   On: 06/04/2022 13:09   CT Head Wo Contrast  Result Date: 06/04/2022 CLINICAL DATA:  Trauma, fall EXAM: CT HEAD WITHOUT CONTRAST TECHNIQUE: Contiguous axial images were obtained from the base of the skull through the vertex without intravenous contrast. RADIATION DOSE REDUCTION: This exam was performed according to the departmental dose-optimization program which includes  automated exposure control, adjustment of the mA and/or kV according to patient size and/or use of iterative reconstruction technique. COMPARISON:  02/02/2020 FINDINGS: Brain: Ventricles are not dilated. There is no shift of midline structures. Ventricles are not dilated. There is decreased density in basal ganglia, periventricular white matter and subcortical white matter. Cortical sulci are slightly prominent. Vascular: Scattered arterial calcifications are seen. Skull: No fracture is seen. Sinuses/Orbits: There is opacification of right side of sphenoid sinus. There is mucosal thickening in ethmoid sinus. Other: None. IMPRESSION: No acute intracranial findings are seen in noncontrast CT brain. Atrophy. Small-vessel disease. Chronic sinusitis. Electronically Signed   By: Elmer Picker M.D.   On: 06/04/2022 13:05   (Echo, Carotid, EGD, Colonoscopy, ERCP)    Subjective: No new complaints she relate she feels great this morning  Discharge Exam: Vitals:   06/08/22 0405 06/08/22 0756  BP: 115/70 (!) 102/59  Pulse: (!) 56 (!) 57  Resp: 18 16  Temp: 98.1 F (36.7 C) 98 F (36.7 C)  SpO2: 94% 92%   Vitals:   06/07/22 2006 06/07/22 2305 06/08/22 0405 06/08/22 0756  BP: 129/73  115/70 (!) 102/59  Pulse: 65  (!) 56 (!) 57  Resp: '19 18 18 16  '$ Temp: 97.7 F (36.5 C)  98.1 F (36.7 C) 98 F (36.7 C)  TempSrc: Oral  Oral Oral  SpO2: 99%  94% 92%  General: Pt is alert, awake, not in acute distress Cardiovascular: RRR, S1/S2 +, no rubs, no gallops Respiratory: CTA bilaterally, no wheezing, no rhonchi Abdominal: Soft, NT, ND, bowel sounds + Extremities: no edema, no cyanosis    The results of significant diagnostics from this hospitalization (including imaging, microbiology, ancillary and laboratory) are listed below for reference.     Microbiology: No results found for this or any previous visit (from the past 240 hour(s)).   Labs: BNP (last 3 results) No results for input(s):  "BNP" in the last 8760 hours. Basic Metabolic Panel: Recent Labs  Lab 06/04/22 1949 06/05/22 0203  NA 138 137  K 3.9 3.7  CL 100 104  CO2 29 26  GLUCOSE 102* 95  BUN 17 20  CREATININE 1.02* 0.98  CALCIUM 10.0 9.6   Liver Function Tests: No results for input(s): "AST", "ALT", "ALKPHOS", "BILITOT", "PROT", "ALBUMIN" in the last 168 hours. No results for input(s): "LIPASE", "AMYLASE" in the last 168 hours. No results for input(s): "AMMONIA" in the last 168 hours. CBC: Recent Labs  Lab 06/04/22 1949 06/05/22 0203  WBC 10.4 8.9  NEUTROABS 7.9*  --   HGB 14.3 13.6  HCT 43.5 42.6  MCV 98.9 99.8  PLT 202 215   Cardiac Enzymes: No results for input(s): "CKTOTAL", "CKMB", "CKMBINDEX", "TROPONINI" in the last 168 hours. BNP: Invalid input(s): "POCBNP" CBG: No results for input(s): "GLUCAP" in the last 168 hours. D-Dimer No results for input(s): "DDIMER" in the last 72 hours. Hgb A1c No results for input(s): "HGBA1C" in the last 72 hours. Lipid Profile No results for input(s): "CHOL", "HDL", "LDLCALC", "TRIG", "CHOLHDL", "LDLDIRECT" in the last 72 hours. Thyroid function studies No results for input(s): "TSH", "T4TOTAL", "T3FREE", "THYROIDAB" in the last 72 hours.  Invalid input(s): "FREET3" Anemia work up No results for input(s): "VITAMINB12", "FOLATE", "FERRITIN", "TIBC", "IRON", "RETICCTPCT" in the last 72 hours. Urinalysis    Component Value Date/Time   COLORURINE STRAW (A) 11/04/2017 1949   APPEARANCEUR CLEAR 11/04/2017 1949   APPEARANCEUR Clear 09/20/2014 2055   LABSPEC 1.006 11/04/2017 1949   LABSPEC 1.008 09/20/2014 2055   PHURINE 7.0 11/04/2017 1949   GLUCOSEU NEGATIVE 11/04/2017 1949   GLUCOSEU Negative 09/20/2014 2055   HGBUR NEGATIVE 11/04/2017 1949   BILIRUBINUR negative 11/24/2021 0921   BILIRUBINUR Negative 09/20/2014 2055   KETONESUR NEGATIVE 11/04/2017 1949   PROTEINUR Negative 11/24/2021 0921   PROTEINUR NEGATIVE 11/04/2017 1949   UROBILINOGEN  0.2 11/24/2021 0921   UROBILINOGEN 0.2 09/09/2014 1429   NITRITE negative 11/24/2021 0921   NITRITE NEGATIVE 11/04/2017 1949   LEUKOCYTESUR Large (3+) (A) 11/24/2021 0921   LEUKOCYTESUR Negative 09/20/2014 2055   Sepsis Labs Recent Labs  Lab 06/04/22 1949 06/05/22 0203  WBC 10.4 8.9   Microbiology No results found for this or any previous visit (from the past 240 hour(s)).  SIGNED:   Charlynne Cousins, MD  [facility] 06/08/2022, 10:48 AM Pager   If 7PM-7AM, please contact night-coverage [email or web address] Password [email or web address]

## 2022-06-08 NOTE — TOC Progression Note (Signed)
Transition of Care [facility]) - Progression Note    Patient Details  Name: CARRISA KELLER MRN: [id number] Date of Birth: May 09, 1946  [facility] CM/SW Contact  Reece Agar, [place] Phone Number: 06/08/2022, 10:09 AM  Clinical Narrative:    Pt daughter has switched facilities, she wants her mother to go to [facility]. This will not cause any delays bc pt still has bed offer at [facility]. Nikki at [facility] is aware and will contact pt daughter to complete paperwork today.    Expected Discharge Plan: [place] Barriers to Discharge: Continued Medical Work up  Expected Discharge Plan and Services Expected Discharge Plan: [place] In-house Referral: Clinical Social Work   Post Acute Care Choice: [place] Living arrangements for the past 2 months: Single Family Home                                       Social Determinants of Health (SDOH) Interventions    Readmission Risk Interventions     No data to display

## 2022-06-08 NOTE — Progress Notes (Addendum)
Called report to [facility] at [facility]. [facility] came to transport stable patient to [facility].

## 2022-06-08 NOTE — Progress Notes (Signed)
Physical Therapy Treatment Patient Details Name: Selena Bush MRN: [id number] DOB: 1945-11-21 Today's Date: 06/08/2022   History of Present Illness Pt is a 76 y/o female admitted secondary to fall. Pt found to have L 8th rib fx. PMH includes dCHF, Afib, parkinson's and CKD.    PT Comments    Pt received in supine, agreeable to therapy session with goal of getting OOB to wheelchair for bed mobility and transfer training to prepare for DC to SNF. Pt needing maxA for bed mobility and +2 maxA for safety to pivot from bed>wheelchair. Pt with poor to zero standing balance without AD and difficulty weight shifting in seated posture with poor dynamic seated balance. Pt defers PO pain meds at end of session. Pt continues to benefit from PT services to progress toward functional mobility goals.    Recommendations for follow up therapy are one component of a multi-disciplinary discharge planning process, led by the attending physician.  Recommendations may be updated based on patient status, additional functional criteria and insurance authorization.  Follow Up Recommendations  Skilled nursing-short term rehab (<3 hours/day) Can patient physically be transported by private vehicle: No (needs wheelchair Lucianne Lei or ambulance)   Assistance Recommended at Discharge Frequent or constant Supervision/Assistance  Patient can return home with the following A lot of help with walking and/or transfers;A lot of help with bathing/dressing/bathroom;Assistance with cooking/housework;Assist for transportation;Help with stairs or ramp for entrance   Equipment Recommendations  None recommended by PT (TBD)    Recommendations for Other Services       Precautions / Restrictions Precautions Precautions: Fall Precaution Comments: PD Restrictions Weight Bearing Restrictions: No     Mobility  Bed Mobility Overal bed mobility: Needs Assistance Bed Mobility: Rolling, Sidelying to Sit Rolling: Mod assist Sidelying  to sit: Max assist       General bed mobility comments: assisted for trunk support up and used bed pad to support a scooting effort; hand over hand assist for use of rail to assist and body mechanics    Transfers Overall transfer level: Needs assistance Equipment used: Rolling walker (2 wheels), 1 person hand held assist Transfers: Sit to/from Stand, Bed to chair/wheelchair/BSC Sit to Stand: Max assist, +2 safety/equipment Stand pivot transfers: Max assist, +2 safety/equipment         General transfer comment: face to face HHA for stand and pivoting, NT also present to assist, pt unable to maintain her feet under her well enough to take pivotal steps.    Ambulation/Gait               General Gait Details: pt not following cues for stepping due to pain vs cognition, narrow BOS when pivoting and heavy posterior lean      Balance Overall balance assessment: Needs assistance Sitting-balance support: Feet supported, Bilateral upper extremity supported Sitting balance-Leahy Scale: Poor Sitting balance - Comments: posterior lean   Standing balance support: Bilateral upper extremity supported, During functional activity Standing balance-Leahy Scale: Zero Standing balance comment: +2 maxA for safety with HHA when pivoting                            Cognition Arousal/Alertness: Awake/alert Behavior During Therapy: WFL for tasks assessed/performed Overall Cognitive Status: Impaired/Different from baseline Area of Impairment: Problem solving, Awareness, Safety/judgement, Following commands                       Following Commands: Follows one step commands  with increased time Safety/Judgement: Decreased awareness of deficits, Decreased awareness of safety Awareness: Intellectual Problem Solving: Slow processing, Requires verbal cues, Decreased initiation, Difficulty sequencing, Requires tactile cues General Comments: pt with slow processing and difficulty  follow simple 1-step multimodal cues; increased assist for bed mobility today        Exercises      General Comments General comments (skin integrity, edema, etc.): pt c/o pain during transfer but defers to take more Tylenol at time of session      Pertinent Vitals/Pain Pain Assessment Pain Assessment: Faces Faces Pain Scale: Hurts even more Pain Location: L ribcage Pain Descriptors / Indicators: Guarding, Moaning, Grimacing Pain Intervention(s): Limited activity within patient's tolerance, Monitored during session, Repositioned           PT Goals (current goals can now be found in the care plan section) Acute Rehab PT Goals Patient Stated Goal: to get stronger PT Goal Formulation: With patient Time For Goal Achievement: 06/19/22 Progress towards PT goals: Progressing toward goals    Frequency    Min 2X/week      PT Plan Current plan remains appropriate       AM-PAC PT "6 Clicks" Mobility   Outcome Measure  Help needed turning from your back to your side while in a flat bed without using bedrails?: A Lot Help needed moving from lying on your back to sitting on the side of a flat bed without using bedrails?: A Lot Help needed moving to and from a bed to a chair (including a wheelchair)?: Total Help needed standing up from a chair using your arms (e.g., wheelchair or bedside chair)?: Total Help needed to walk in hospital room?: Total Help needed climbing 3-5 steps with a railing? : Total 6 Click Score: 8    End of Session   Activity Tolerance: Patient limited by fatigue;Other (comment) ([facility] transport arriving to take her to SNF) Patient left: in chair;with family/visitor present;with nursing/sitter in room;with call bell/phone within reach (in care of [facility] transporter and family) Nurse Communication: Mobility status PT Visit Diagnosis: Other abnormalities of gait and mobility (R26.89);History of falling (Z91.81);Muscle weakness (generalized)  (M62.81);Unsteadiness on feet (R26.81);Difficulty in walking, not elsewhere classified (R26.2)     Time: 8338-2505 PT Time Calculation (min) (ACUTE ONLY): 10 min  Charges:  $Therapeutic Activity: 8-22 mins                     Smrithi Pigford P., PTA Acute Rehabilitation Services Secure Chat Preferred 9a-5:30pm Office: [place] 06/08/2022, 2:41 PM

## 2022-06-08 NOTE — Plan of Care (Signed)

## 2022-06-17 ENCOUNTER — Telehealth: Payer: Self-pay | Admitting: Cardiology

## 2022-06-17 NOTE — Telephone Encounter (Signed)
Spoke to [place] with [facility] she was calling to clarify if patient should be taking Metoprolol and Sotalol.I will send message to [facility] for advice.

## 2022-06-17 NOTE — Telephone Encounter (Signed)
Pt c/o medication issue:  1. Name of Medication:  sotalol (BETAPACE) 120 MG tablet    metoprolol tartrate (LOPRESSOR) 25 MG tablet  2. How are you currently taking this medication (dosage and times per day)?   3. Are you having a reaction (difficulty breathing--STAT)?   4. What is your medication issue?   [place] with [facility] is calling to clarify medications. Should patient be taking both Metoprolol and Sotalol. Please advise.

## 2022-06-17 NOTE — Telephone Encounter (Signed)
Spoke to [place] with [facility] Dr.Crenshaw advised to continue Metoprolol and Sotalol.

## 2022-07-11 ENCOUNTER — Other Ambulatory Visit: Payer: Self-pay | Admitting: Neurology

## 2022-07-11 ENCOUNTER — Telehealth: Payer: Self-pay | Admitting: Neurology

## 2022-07-11 ENCOUNTER — Ambulatory Visit (INDEPENDENT_AMBULATORY_CARE_PROVIDER_SITE_OTHER): Payer: Medicare (Managed Care) | Admitting: Neurology

## 2022-07-11 ENCOUNTER — Encounter: Payer: Self-pay | Admitting: Neurology

## 2022-07-11 VITALS — BP 116/66 | HR 58 | Ht 62.0 in | Wt 138.5 lb

## 2022-07-11 DIAGNOSIS — G20C Parkinsonism, unspecified: Secondary | ICD-10-CM | POA: Diagnosis not present

## 2022-07-11 DIAGNOSIS — R269 Unspecified abnormalities of gait and mobility: Secondary | ICD-10-CM

## 2022-07-11 MED ORDER — RYTARY 36.25-145 MG PO CPCR: 3.0000 | ORAL_CAPSULE | Freq: Three times a day (TID) | ORAL | 11 refills | Status: DC

## 2022-07-11 NOTE — Telephone Encounter (Signed)
pace sent to [facility] for scheduling [phone]

## 2022-07-11 NOTE — Progress Notes (Addendum)
Chief Complaint  Patient presents with   New Patient (Initial Visit)    Rm 12. Accompanied by daughter and caretaker. NP/Paper/Brittany Sabra Heck [facility]/TOC parkinsons and dementia.      ASSESSMENT AND PLAN  Selena Bush is a 76 y.o. female  Rapid progression of parkinsonian syndrome,  Significant gait abnormality, retropulse instability and relatively symmetric, lack of response to carbidopa levodopa, now after levodopa 900 mg daily, 6-7 times every day  CT of the brain showed significant cerebral small vessel disease, proceed with MRI of the brain  History are not typical idiopathic Parkinson's disease, differentiation diagnoses also including progressive supranuclear palsy, simplifying her carbidopa-levodopa treatment, Rytary 145, 3 tablets 3 times a day.  Keep Sinemet ER 25/100 mg at nighttime  Return to clinic in 3 months  DIAGNOSTIC DATA (LABS, IMAGING, TESTING) - I reviewed patient records, labs, notes, testing and imaging myself where available.   MEDICAL HISTORY:  Selena Bush, is a 76 year old female, accompanied by her daughter and caregiver Holley Raring, seen in request by her primary care nurse practitioner Raynelle Fanning, for evaluation of gait abnormality, parkinsonian syndrome.  I reviewed and summarized the referring note. PMHX HLD DM Depression HTN A fib, -in sinus rhythm CHF Right Breast Cancer in 2014, s/p lobectomy and radiation therapy. OSA-on CPAP Knee replacement  Patient currently lives at home with her caregiver Holley Raring, who began to living with patient since August 2022.  Around 2020, she noticed intermittent left hand shaking, then began to involving right hand, rapid progression of gait abnormality, change of walking posturing, he was seen by Dr. Verne Carrow [facility] since January 2021, initial evaluation documented difficulty holding a cup of coffee, denies difficulty walking, no change of the taste or voice, patient  declines medication initially  She was noted to have rapid progression, June 2021, patient was noted to have left hand resting tremor, stiffness, parkinsonism features, difficulty walking with shuffling gait, difficulty getting in and out of chair and the car, soft voice,  EMG nerve conduction study also confirmed mild left carpal tunnel syndrome  She was started on Sinemet 25/100 mg around that time, initially respond well, later visit in December 2021 Dr. Starleen Blue documented frequent fall, quick titration of Sinemet 25/100  to 7 tablet  Follow-up in 2022, patient remains 700 mg of levodopa, but still have significant gait abnormality, difficult to keep independent, increased falling,  Most recent visit with Dr. Starleen Blue was on January 13, 2022, Seroquel 25 mg was started titrating to 50 mg for evening time agitation, sleep disturbance, hallucinations, also on titrating dose of gabapentin for sleep, acting out of dreams, nightmares, visual hallucination of people standing house, can be scary for her  Patient continue with walker today, has increased gait abnormality, can only take few steps with walker and assistant, current on Sinemet 25/100 9 AM 2, p.m. 1, 12 1, 3 PM 2, 6 PM 1, 9 PM 2, (total immediate release 900 mg of levodopa), plus Sinemet ER 25/100 mg before bedtime  She continues to have trouble sleeping, difficulty going to and staying asleep, sleeping in recliner now, also feeling anxious, lost appetite, poor appetite, lost 60 pounds over past couple years, mostly hard in her sleep, vivid dreams,  She was given Abilify, Seroquel per family for hallucinations, but may currently like insomnia, no longer taking it, she can only sleep 2 to 3 hours each time, frequent awakening, sedentary during the day   PHYSICAL EXAM:  Vitals:   07/11/22 1435  BP: 116/66  Pulse: (!) 58  Weight: 138 lb 8 oz (62.8 kg)  Height: '5\' 2"'$  (1.575 m)    Body mass index is 25.33 kg/m.  PHYSICAL  EXAMNIATION:  Gen: NAD, conversant, well nourised, well groomed                     Cardiovascular: Regular rate rhythm, no peripheral edema, warm, nontender. Eyes: Conjunctivae clear without exudates or hemorrhage Neck: Supple, no carotid bruits. Pulmonary: Clear to auscultation bilaterally   NEUROLOGICAL EXAM:  MENTAL STATUS: Speech/cognition: Awake, alert, oriented to history taking and casual conversation    07/11/2022    2:37 PM  Montreal Cognitive Assessment   Visuospatial/ Executive (0/5) 2  Naming (0/3) 3  Attention: Read list of digits (0/2) 2  Attention: Read list of letters (0/1) 1  Attention: Serial 7 subtraction starting at 100 (0/3) 3  Language: Repeat phrase (0/2) 2  Language : Fluency (0/1) 0  Abstraction (0/2) 2  Delayed Recall (0/5) 3  Orientation (0/6) 5  Total 23  Adjusted Score (based on education) 23    CRANIAL NERVES: CN II: Visual fields are full to confrontation. Pupils are round equal and briskly reactive to light. CN III, IV, VI: extraocular movement are normal. No ptosis. CN V: Facial sensation is intact to light touch CN VII: Face is symmetric with normal eye closure  CN VIII: Hearing is normal to causal conversation. CN IX, X: Phonation is normal. CN XI: Head turning and shoulder shrug are intact  MOTOR: Anterocollis, maximum neck erection was 90 degree, no resting tremor, no weakness, fairly symmetric bilateral upper and lower extremity moderate rigidity, bradykinesia  REFLEXES: Reflexes are 1  and symmetric at the biceps, triceps, knees, and ankles. Plantar responses are flexor.  SENSORY: Intact to light touch, pinprick and vibratory sensation are intact in fingers and toes.  COORDINATION: There is no trunk or limb dysmetria noted.  GAIT/STANCE: Need people help to get up from seated position, significant retropulse instability, difficulty initiate gait, small stride, en bloc turning, rely on her walker,  REVIEW OF SYSTEMS:  Full 14  system review of systems performed and notable only for as above All other review of systems were negative.   ALLERGIES: Allergies  Allergen Reactions   Penicillins Anaphylaxis    anaphylaxis  Has patient had a PCN reaction causing immediate rash, facial/tongue/throat swelling, SOB or lightheadedness with hypotension: Yes Has patient had a PCN reaction causing severe rash involving mucus membranes or skin necrosis: No Has patient had a PCN reaction that required hospitalization: No Has patient had a PCN reaction occurring within the last 10 years: No If all of the above answers are "NO", then may proceed with Cephalosporin use.    Sulfa Antibiotics Anaphylaxis   Codeine Other (See Comments) and Nausea And Vomiting    Gi problems    Statins Other (See Comments)    Leg pains   Xarelto [Rivaroxaban] Swelling    pericarditis   Aspirin Other (See Comments)    "burned my stomach intensely" Abdominal pain and burning    HOME MEDICATIONS: Current Outpatient Medications  Medication Sig Dispense Refill   acetaminophen (TYLENOL) 500 MG tablet Take 500 mg by mouth every 6 (six) hours as needed for moderate pain.     bisacodyl (DULCOLAX) 5 MG EC tablet Take 5 mg by mouth daily as needed for moderate constipation.     carbidopa-levodopa (SINEMET IR) 25-100 MG tablet Take  2 tablets at 6 AM, 1 at 9AM, 1 tab at 12 PM,  2 at 3 PM, 1 at 6 PM and 2 at 9 PM.     CARBIDOPA-LEVODOPA ER PO Take 25-100 mg by mouth daily.     Cholecalciferol (VITAMIN D-3) 25 MCG (1000 UT) CAPS Take 1 capsule by mouth daily.     Cyanocobalamin (VITAMIN B-12) 5000 MCG SUBL Place 1 each under the tongue daily.     DULoxetine (CYMBALTA) 60 MG capsule TAKE ONE CAPSULE BY MOUTH TWICE DAILY FOR anxiety AND depression (Patient taking differently: Take 60 mg by mouth 2 (two) times daily. TAKE ONE CAPSULE BY MOUTH TWICE DAILY FOR anxiety AND depression) 180 capsule 0   ezetimibe (ZETIA) 10 MG tablet TAKE ONE TABLET BY MOUTH EVERY  MORNING (Patient taking differently: Take 10 mg by mouth daily.) 90 tablet 3   fluticasone (FLONASE) 50 MCG/ACT nasal spray Place 2 sprays into both nostrils daily as needed for rhinitis or allergies. 48 g 3   furosemide (LASIX) 20 MG tablet TAKE ONE TABLET BY MOUTH ONCE DAILY May take additional tablet daily if needed (Patient taking differently: Take 40 mg by mouth 2 (two) times daily.) 180 tablet 3   gabapentin (NEURONTIN) 600 MG tablet Take 600 mg by mouth at bedtime.     JARDIANCE 10 MG TABS tablet TAKE ONE TABLET BY MOUTH BEFORE BREAKFAST (Patient taking differently: Take 10 mg by mouth daily.) 30 tablet 5   metoprolol tartrate (LOPRESSOR) 25 MG tablet TAKE THREE TABLETS BY MOUTH TWICE DAILY (Patient taking differently: Take 75 mg by mouth 2 (two) times daily.) 540 tablet 1   Multiple Vitamins-Minerals (CEROVITE SENIOR PO) Take 1 tablet by mouth daily.     oxybutynin (DITROPAN) 5 MG tablet Take 5 mg by mouth 2 (two) times daily.     sotalol (BETAPACE) 120 MG tablet TAKE ONE TABLET BY MOUTH TWICE DAILY (Patient taking differently: Take 120 mg by mouth 2 (two) times daily.) 180 tablet 3   spironolactone (ALDACTONE) 25 MG tablet Take 1 tablet (25 mg total) by mouth daily. 90 tablet 1   tiZANidine (ZANAFLEX) 4 MG tablet TAKE 2 TABLET (4 MG TOTAL) BY MOUTH DAILY FOR MUSCLE SPASMS AT BEDTIME. (Patient taking differently: Take 4 mg by mouth in the morning and at bedtime.) 180 tablet 3   No current facility-administered medications for this visit.    PAST MEDICAL HISTORY: Past Medical History:  Diagnosis Date   Anxiety    Atrial fibrillation ([place])    Cat bite of right hand 03/25/2019   Chronic diastolic CHF (congestive heart failure) ([place])    a. echo 09/2014: EF 23-76%, diastolic dysfunction, mild LVH, nl RV size & systolic function, mildly dilated LA (4.3 cm), mild MR/TR, mildly elevated PASP 36.7 mm Hg   DDD (degenerative disc disease), cervical    DDD (degenerative disc disease), lumbar     Depression    Diffuse cystic mastopathy 2014   Dyspnea    Dysrhythmia    GERD (gastroesophageal reflux disease)    Gout    Headache    rare   HLD (hyperlipidemia)    a. statin intolerant 2/2 myalgias   Hx of dysplastic nevus 12/25/2018   L medial ankle   Hypercholesterolemia    Hypertension    Malignant neoplasm of upper-outer quadrant of female breast ([place]) 10/2012   Papillary DCIS, sentinel node negative. DR/PR positive. PARTIAL RIGHT MASTECTOMY FOR BREAST CANCER--HAD RADIATION - NO CHEMO --DR. Alvia Grove ONCOLOGIST  Obesity    OSA on CPAP    Osteoarthritis of both knees    a. s/p right TKA 04/2013 & left TKA 09/2014   Otitis externa    Parkinson disease    Personal history of radiation therapy 2015   RIGHT breast-mammosite per pt   Pneumonia of both lungs due to infectious organism 01/08/2021   Sleep difficulties    LUNESTA HAS HELPED   Vaginal cyst     PAST SURGICAL HISTORY: Past Surgical History:  Procedure Laterality Date   ABDOMINAL HYSTERECTOMY  1992   DUB; fibroids; endometriosis.  One remaining ovary.     BREAST LUMPECTOMY Right 2015   Papillary DCIS, sentinel node negative. DR/PR positive. PARTIAL RIGHT MASTECTOMY FOR BREAST CANCER--HAD RADIATION - NO CHEMO --DR. CRYSTAL [place] ONCOLOGIST   BREAST SURGERY Right 10/2012   Wide excision,APB RT 10 mm papillary DCIS, ER/PR positive. Sentinel node negative. Partial breast radiation.   CATARACT EXTRACTION, BILATERAL  02/13/2016   [place].   CHOLECYSTECTOMY  1994   COLONOSCOPY  2015   1 benign polyp-every 5 years/ Dr Candace Cruise   COLONOSCOPY WITH PROPOFOL N/A 02/10/2021   Procedure: COLONOSCOPY WITH PROPOFOL;  Surgeon: Robert Bellow, MD;  Location: [facility] ENDOSCOPY;  Service: Endoscopy;  Laterality: N/A;   ERCP  1995   JOINT REPLACEMENT Right 04/2013   knee   PERICARDIOCENTESIS N/A 11/13/2017   Procedure: PERICARDIOCENTESIS;  Surgeon: Nelva Bush, MD;  Location: [facility];  Service:  Cardiovascular;  Laterality: N/A;   TOTAL KNEE ARTHROPLASTY Right 04/16/2013   Procedure: RIGHT TOTAL KNEE ARTHROPLASTY;  Surgeon: Mauri Pole, MD;  Location: [facility] ORS;  Service: Orthopedics;  Laterality: Right;   TOTAL KNEE ARTHROPLASTY Left 09/15/2014   Procedure: LEFT TOTAL KNEE ARTHROPLASTY;  Surgeon: Mauri Pole, MD;  Location: [facility] ORS;  Service: Orthopedics;  Laterality: Left;   TUBAL LIGATION  1979   UPPER GI ENDOSCOPY      FAMILY HISTORY: Family History  Problem Relation Age of Onset   Colon cancer Mother    Cancer Mother 66       colon cancer   Melanoma Father    Cancer Father 60       melanoma   Colon cancer Maternal Grandfather    Breast cancer Maternal Grandfather    Melanoma Sister    Melanoma Sister     SOCIAL HISTORY: Social History   Socioeconomic History   Marital status: Widowed    Spouse name: Chrissie Noa   Number of children: 2   Years of education: College   Highest education level: Bachelor's degree (e.g., BA, AB, BS)  Occupational History   Occupation: Retired  Tobacco Use   Smoking status: Never   Smokeless tobacco: Never   Tobacco comments:    social smoker as a teen  Scientific laboratory technician Use: Never used  Substance and Sexual Activity   Alcohol use: No   Drug use: No   Sexual activity: Not Currently    Birth control/protection: Post-menopausal, Surgical  Other Topics Concern   Not on file  Social History Narrative   Marital status: widowed since 09/16/2015      Children: 2 children (67, 39); 5 grandchild (4 in [place]; 1 in [place]).      Lives: alone in townhome; 1 dog, 2 cats      Employment: psychiatric Education officer, museum; retired in 2015      Tobacco: teenager only      Alcohol:  None  Drugs: none      Exercise: walking in 2019; more active in 2019.  Walking daily small amounts.        ADLs: drives; independent with ADLs; no assistant devices      Advanced Directives: none; FULL CODE; no prolonged measures.   Does not need them.   Daughter/Heather [place] Deck is HCPOA.    Social Determinants of Health   Financial Resource Strain: Low Risk  (01/06/2022)   Overall Financial Resource Strain (CARDIA)    Difficulty of Paying Living Expenses: Not hard at all  Food Insecurity: No Food Insecurity (01/06/2022)   Hunger Vital Sign    Worried About Running Out of Food in the Last Year: Never true    Ran Out of Food in the Last Year: Never true  Transportation Needs: No Transportation Needs (01/06/2022)   PRAPARE - Hydrologist (Medical): No    Lack of Transportation (Non-Medical): No  Physical Activity: Insufficiently Active (01/06/2022)   Exercise Vital Sign    Days of Exercise per Week: 7 days    Minutes of Exercise per Session: 10 min  Stress: No Stress Concern Present (01/06/2022)   [place]    Feeling of Stress : Not at all  Social Connections: Moderately Integrated (10/02/2017)   Social Connection and Isolation Panel [NHANES]    Frequency of Communication with Friends and Family: More than three times a week    Frequency of Social Gatherings with Friends and Family: More than three times a week    Attends Religious Services: More than 4 times per year    Active Member of [employer] or Organizations: Yes    Attends Archivist Meetings: More than 4 times per year    Marital Status: Widowed  Intimate Partner Violence: Not At Risk (10/02/2017)   Humiliation, Afraid, Rape, and Kick questionnaire    Fear of Current or Ex-Partner: No    Emotionally Abused: No    Physically Abused: No    Sexually Abused: No      Marcial Pacas, M.D. Ph.D.  [facility] [address], [place], [place] [address] Ph: [phone] Fax: [phone]  CC:  Raynelle Fanning, NP [address] [place],  [place] [address]  Pcp, No

## 2022-07-12 ENCOUNTER — Ambulatory Visit: Payer: Medicare HMO | Admitting: Family

## 2022-07-12 ENCOUNTER — Telehealth: Payer: Self-pay | Admitting: Neurology

## 2022-07-12 DIAGNOSIS — G20C Parkinsonism, unspecified: Secondary | ICD-10-CM

## 2022-07-12 DIAGNOSIS — R269 Unspecified abnormalities of gait and mobility: Secondary | ICD-10-CM

## 2022-07-12 NOTE — Telephone Encounter (Signed)
[name] is calling from [facility]. Stated pt prescription for medication  Carbidopa-Levodopa ER (RYTARY) 36.25-145 MG CPCR  need to be faxed to [phone] to there office instead of the [facility] pharmacy.

## 2022-07-12 NOTE — Telephone Encounter (Addendum)
I received incoming call from Dr. Sabra Heck with [facility].   She wanted to discuss the pt's Rytary prescription.  This prescription is available for the pt but has a higher Copay. Dr. Sabra Heck wanted to know if adjustments in the [facility]  ER could be reccommended instead due to the high copay?  She welcomes a call from Dr. Krista Blue at her cell # [phone] if she would like to call and discuss MD to MD.

## 2022-07-14 NOTE — Telephone Encounter (Signed)
I was able to discuss with [facility] [place], she has authorized Rytary as prescribed, if patient has no significant improvement with his Rytary at next follow-up visit in February 2024, will consider other form of levodopa treatment

## 2022-07-18 NOTE — Progress Notes (Signed)
HPI: Follow-up diastolic congestive heart failure, paroxysmal atrial fibrillation, pericardial effusion, hypertension and hyperlipidemia. Patient was admitted March 2019 with atypical chest pain and paroxysmal atrial fibrillation. She was treated with sotalol and Xarelto. However she returned with significant dyspnea and follow-up echocardiogram showed large pericardial effusion with tamponade physiology.  She had pericardiocentesis on April 1. Cytology was negative.  At that time we felt it was likely her initial presentation was pericarditis and she had a hemorrhagic pericardial effusion from initiation of anticoagulation.  We felt her atrial fibrillation may improve once pericardial process resolved. Patient was admitted with pneumonia and recurrent atrial fibrilllation to an outside hospital. Last echocardiogram September 2022 showed normal LV function, mild left ventricular hypertrophy, grade 2 diastolic dysfunction, moderate pulmonary hypertension, severe left atrial enlargement, cannot rule out PFO, mild aortic stenosis with mean gradient 10 mmHg.  Recently discharged after admission for fall.  Since last seen she denies dyspnea, chest pain, palpitations or syncope.  She continues to have difficulties with Parkinson's and falls.  Current Outpatient Medications  Medication Sig Dispense Refill   acetaminophen (TYLENOL) 500 MG tablet Take 500 mg by mouth every 6 (six) hours as needed for moderate pain.     bisacodyl (DULCOLAX) 5 MG EC tablet Take 5 mg by mouth daily as needed for moderate constipation.     Carbidopa-Levodopa ER (RYTARY) 36.25-145 MG CPCR Take 3 tablets by mouth 3 (three) times daily. (Patient taking differently: Take 3 tablets by mouth 3 (three) times daily. 7 am 12 pm 5 pm) 270 capsule 11   CARBIDOPA-LEVODOPA ER PO Take 25-100 mg by mouth daily.     Cholecalciferol (VITAMIN D-3) 25 MCG (1000 UT) CAPS Take 1 capsule by mouth daily.     Cyanocobalamin (VITAMIN B-12) 5000 MCG SUBL  Place 1 each under the tongue daily.     DULoxetine (CYMBALTA) 60 MG capsule TAKE ONE CAPSULE BY MOUTH TWICE DAILY FOR anxiety AND depression (Patient taking differently: Take 60 mg by mouth 2 (two) times daily. TAKE ONE CAPSULE BY MOUTH TWICE DAILY FOR anxiety AND depression) 180 capsule 0   ezetimibe (ZETIA) 10 MG tablet TAKE ONE TABLET BY MOUTH EVERY MORNING (Patient taking differently: Take 10 mg by mouth daily.) 90 tablet 3   fluticasone (FLONASE) 50 MCG/ACT nasal spray Place 2 sprays into both nostrils daily as needed for rhinitis or allergies. 48 g 3   furosemide (LASIX) 20 MG tablet TAKE ONE TABLET BY MOUTH ONCE DAILY May take additional tablet daily if needed (Patient taking differently: Take 40 mg by mouth 2 (two) times daily.) 180 tablet 3   gabapentin (NEURONTIN) 600 MG tablet Take 600 mg by mouth at bedtime.     JARDIANCE 10 MG TABS tablet TAKE ONE TABLET BY MOUTH BEFORE BREAKFAST (Patient taking differently: Take 10 mg by mouth daily.) 30 tablet 5   Melatonin 5 MG CHEW Chew 10 mg by mouth at bedtime.     metoprolol tartrate (LOPRESSOR) 25 MG tablet TAKE THREE TABLETS BY MOUTH TWICE DAILY (Patient taking differently: Take 75 mg by mouth 2 (two) times daily.) 540 tablet 1   Multiple Vitamins-Minerals (CEROVITE SENIOR PO) Take 1 tablet by mouth daily.     oxybutynin (DITROPAN) 5 MG tablet Take 5 mg by mouth 2 (two) times daily.     sotalol (BETAPACE) 120 MG tablet TAKE ONE TABLET BY MOUTH TWICE DAILY (Patient taking differently: Take 120 mg by mouth 2 (two) times daily.) 180 tablet 3   spironolactone (ALDACTONE)  25 MG tablet Take 1 tablet (25 mg total) by mouth daily. 90 tablet 1   tiZANidine (ZANAFLEX) 4 MG tablet TAKE 2 TABLET (4 MG TOTAL) BY MOUTH DAILY FOR MUSCLE SPASMS AT BEDTIME. (Patient taking differently: Take 4 mg by mouth in the morning and at bedtime.) 180 tablet 3   No current facility-administered medications for this visit.     Past Medical History:  Diagnosis Date    Anxiety    Atrial fibrillation ([place])    Cat bite of right hand 03/25/2019   Chronic diastolic CHF (congestive heart failure) ([place])    a. echo 09/2014: EF 58-85%, diastolic dysfunction, mild LVH, nl RV size & systolic function, mildly dilated LA (4.3 cm), mild MR/TR, mildly elevated PASP 36.7 mm Hg   DDD (degenerative disc disease), cervical    DDD (degenerative disc disease), lumbar    Depression    Diffuse cystic mastopathy 2014   Dyspnea    Dysrhythmia    GERD (gastroesophageal reflux disease)    Gout    Headache    rare   HLD (hyperlipidemia)    a. statin intolerant 2/2 myalgias   Hx of dysplastic nevus 12/25/2018   L medial ankle   Hypercholesterolemia    Hypertension    Malignant neoplasm of upper-outer quadrant of female breast ([place]) 10/2012   Papillary DCIS, sentinel node negative. DR/PR positive. PARTIAL RIGHT MASTECTOMY FOR BREAST CANCER--HAD RADIATION - NO CHEMO --DR. CRYSTAL [place] ONCOLOGIST   Obesity    OSA on CPAP    Osteoarthritis of both knees    a. s/p right TKA 04/2013 & left TKA 09/2014   Otitis externa    Parkinson disease    Personal history of radiation therapy 2015   RIGHT breast-mammosite per pt   Pneumonia of both lungs due to infectious organism 01/08/2021   Sleep difficulties    LUNESTA HAS HELPED   Vaginal cyst     Past Surgical History:  Procedure Laterality Date   ABDOMINAL HYSTERECTOMY  1992   DUB; fibroids; endometriosis.  One remaining ovary.     BREAST LUMPECTOMY Right 2015   Papillary DCIS, sentinel node negative. DR/PR positive. PARTIAL RIGHT MASTECTOMY FOR BREAST CANCER--HAD RADIATION - NO CHEMO --DR. CRYSTAL  ONCOLOGIST   BREAST SURGERY Right 10/2012   Wide excision,APB RT 10 mm papillary DCIS, ER/PR positive. Sentinel node negative. Partial breast radiation.   CATARACT EXTRACTION, BILATERAL  02/13/2016   [facility].   CHOLECYSTECTOMY  1994   COLONOSCOPY  2015   1 benign polyp-every 5 years/ Dr Candace Cruise   COLONOSCOPY WITH PROPOFOL  N/A 02/10/2021   Procedure: COLONOSCOPY WITH PROPOFOL;  Surgeon: Robert Bellow, MD;  Location: [facility] ENDOSCOPY;  Service: Endoscopy;  Laterality: N/A;   ERCP  1995   JOINT REPLACEMENT Right 04/2013   knee   PERICARDIOCENTESIS N/A 11/13/2017   Procedure: PERICARDIOCENTESIS;  Surgeon: Nelva Bush, MD;  Location: [facility];  Service: Cardiovascular;  Laterality: N/A;   TOTAL KNEE ARTHROPLASTY Right 04/16/2013   Procedure: RIGHT TOTAL KNEE ARTHROPLASTY;  Surgeon: Mauri Pole, MD;  Location: [facility] OR[facility];  Service: Orthopedics;  Laterality: Right;   TOTAL KNEE ARTHROPLASTY Left 09/15/2014   Procedure: LEFT TOTAL KNEE ARTHROPLASTY;  Surgeon: Mauri Pole, MD;  Location: [facility] OR[facility];  Service: Orthopedics;  Laterality: Left;   TUBAL LIGATION  1979   UPPER GI ENDOSCOPY      Social History   Socioeconomic History   Marital status: Widowed    Spouse name: Chrissie Noa  Number of children: 2   Years of education: College   Highest education level: Bachelor's degree (e.g., BA, AB, BS)  Occupational History   Occupation: Retired  Tobacco Use   Smoking status: Never   Smokeless tobacco: Never   Tobacco comments:    social smoker as a teen  Scientific laboratory technician Use: Never used  Substance and Sexual Activity   Alcohol use: No   Drug use: No   Sexual activity: Not Currently    Birth control/protection: Post-menopausal, Surgical  Other Topics Concern   Not on file  Social History Narrative   Marital status: widowed since 09/16/2015      Children: 2 children (33, 45); 5 grandchild (4 in [place]; 1 in [place]).      Lives: alone in townhome; 1 dog, 2 cats      Employment: psychiatric Education officer, museum; retired in 2015      Tobacco: teenager only      Alcohol:  None      Drugs: none      Exercise: walking in 2019; more active in 2019.  Walking daily small amounts.        ADLs: drives; independent with ADLs; no assistant devices      Advanced Directives: none; FULL CODE; no prolonged  measures.   Does not need them.  Daughter/Heather [place] Deck is HCPOA.    Social Determinants of Health   Financial Resource Strain: Low Risk  (01/06/2022)   Overall Financial Resource Strain (CARDIA)    Difficulty of Paying Living Expenses: Not hard at all  Food Insecurity: No Food Insecurity (01/06/2022)   Hunger Vital Sign    Worried About Running Out of Food in the Last Year: Never true    Ran Out of Food in the Last Year: Never true  Transportation Needs: No Transportation Needs (01/06/2022)   PRAPARE - Hydrologist (Medical): No    Lack of Transportation (Non-Medical): No  Physical Activity: Insufficiently Active (01/06/2022)   Exercise Vital Sign    Days of Exercise per Week: 7 days    Minutes of Exercise per Session: 10 min  Stress: No Stress Concern Present (01/06/2022)   [place]    Feeling of Stress : Not at all  Social Connections: Moderately Integrated (10/02/2017)   Social Connection and Isolation Panel [NHANES]    Frequency of Communication with Friends and Family: More than three times a week    Frequency of Social Gatherings with Friends and Family: More than three times a week    Attends Religious Services: More than 4 times per year    Active Member of [employer] or Organizations: Yes    Attends Archivist Meetings: More than 4 times per year    Marital Status: Widowed  Intimate Partner Violence: Not At Risk (10/02/2017)   Humiliation, Afraid, Rape, and Kick questionnaire    Fear of Current or Ex-Partner: No    Emotionally Abused: No    Physically Abused: No    Sexually Abused: No    Family History  Problem Relation Age of Onset   Colon cancer Mother    Cancer Mother 80       colon cancer   Melanoma Father    Cancer Father 35       melanoma   Colon cancer Maternal Grandfather    Breast cancer Maternal Grandfather    Melanoma Sister    Melanoma Sister  ROS:  no fevers or chills, productive cough, hemoptysis, dysphasia, odynophagia, melena, hematochezia, dysuria, hematuria, rash, seizure activity, orthopnea, PND, pedal edema, claudication. Remaining systems are negative.  Physical Exam: Well-developed well-nourished in no acute distress.  Skin is warm and dry.  HEENT is normal.  Neck is supple.  Chest is clear to auscultation with normal expansion.  Cardiovascular exam is regular rate and rhythm.  Abdominal exam nontender or distended. No masses palpated. Extremities show no edema. neuro consistent with Parkinson's.  A/P  1 history of paroxysmal atrial fibrillation-patient remains in sinus rhythm.  Continue sotalol and beta-blocker.  Patient developed Parkinson's and has had frequent falls.  She also was noted to have hemorrhagic pericardial effusion when Xarelto was initiated previously.  She has therefore not been anticoagulated as risk outweighs benefit.  2 chronic diastolic congestive heart failure-she is euvolemic and we will continue diuretic at present dose.  3 hypertension-blood pressure is controlled.  Continue present medications.  4 hyperlipidemia-continue Zetia.  She is intolerant to statins.  5 history of aortic stenosis-mild on most recent echocardiogram.  Will likely repeat in 1 year.  6 history of hemorrhagic pericardial effusion-follow-up echo showed resolution.  [name], MD

## 2022-07-20 ENCOUNTER — Ambulatory Visit: Payer: Self-pay | Admitting: Family

## 2022-07-26 ENCOUNTER — Encounter: Payer: Self-pay | Admitting: Cardiology

## 2022-07-26 ENCOUNTER — Ambulatory Visit: Payer: Medicare (Managed Care) | Attending: Cardiology | Admitting: Cardiology

## 2022-07-26 VITALS — BP 98/64 | HR 64 | Ht 62.0 in | Wt 138.4 lb

## 2022-07-26 DIAGNOSIS — I10 Essential (primary) hypertension: Secondary | ICD-10-CM | POA: Diagnosis not present

## 2022-07-26 DIAGNOSIS — I5032 Chronic diastolic (congestive) heart failure: Secondary | ICD-10-CM | POA: Diagnosis not present

## 2022-07-26 DIAGNOSIS — I480 Paroxysmal atrial fibrillation: Secondary | ICD-10-CM

## 2022-07-26 DIAGNOSIS — E782 Mixed hyperlipidemia: Secondary | ICD-10-CM | POA: Diagnosis not present

## 2022-07-26 NOTE — Patient Instructions (Signed)
  Follow-Up: At [facility], you and your health needs are our priority.  As part of our continuing mission to provide you with exceptional heart care, we have created designated Provider Care Teams.  These Care Teams include your primary Cardiologist (physician) and Advanced Practice Providers (APPs -  Physician Assistants and Nurse Practitioners) who all work together to provide you with the care you need, when you need it.  We recommend signing up for the patient portal called "MyChart".  Sign up information is provided on this After Visit Summary.  MyChart is used to connect with patients for Virtual Visits (Telemedicine).  Patients are able to view lab/test results, encounter notes, upcoming appointments, etc.  Non-urgent messages can be sent to your provider as well.   To learn more about what you can do with MyChart, go to [email or web address].    Your next appointment:   12 month(s)  The format for your next appointment:   In Person  Provider:   Brian Crenshaw, MD   

## 2022-07-27 ENCOUNTER — Telehealth: Payer: Self-pay | Admitting: Neurology

## 2022-07-27 NOTE — Telephone Encounter (Signed)
Holley Raring is calling. Stated she was told by the Cardiologist that it's okay to give pt a Benadryl at night. Stated she wanted to follow-up with Dr. Krista Blue to see if it's okay with all her Parkinson's medication.

## 2022-07-28 NOTE — Telephone Encounter (Signed)
Do you agree Benadryl can be used while taking Rytary?

## 2022-07-28 NOTE — Telephone Encounter (Signed)
  Please call patient, I would not suggest Benadryl as sleeping aid, has a lot of side effects especially for elderly with central nervous system degenerative disease  she may add on melatonin,  up to 10 mg daily, starting at 3 mg every night,  If she has excessive movement during sleep, we may consider add on clonazepam low-dose    Common side effect for Benadryl, Gastrointestinal: Xerostomia Neurologic: Dizziness, Dyskinesia, Sedated Psychiatric: Somnolence Respiratory: Nasal mucosa dry, Pharyngeal dryness, Thick sputum, Bronchial  Neurological side effect Diphenhydramine Hydrochloride= Benadryl Dyskinesia Dystonia Encephalopathy Neurological finding

## 2022-07-28 NOTE — Telephone Encounter (Signed)
Contacted pt back, LVM rq CB  

## 2022-07-29 ENCOUNTER — Ambulatory Visit: Payer: Self-pay | Admitting: Family

## 2022-07-29 ENCOUNTER — Telehealth: Payer: Self-pay | Admitting: Family

## 2022-07-29 NOTE — Telephone Encounter (Signed)
Patient did not show for her [facility] appointment on 07/29/22. Will attempt to reschedule.

## 2022-08-01 NOTE — Telephone Encounter (Signed)
I called pt. No answer, left a message asking pt to call me back.   

## 2022-08-03 NOTE — Telephone Encounter (Signed)
Wee have attempted to reach the pt x2 with no call back. Will attempt again at a later time.

## 2022-08-25 ENCOUNTER — Ambulatory Visit
Admission: RE | Admit: 2022-08-25 | Discharge: 2022-08-25 | Disposition: A | Discharge status: Home / Self Care | Payer: Medicare (Managed Care) | Source: Ambulatory Visit | Attending: Neurology | Admitting: Neurology

## 2022-08-25 DIAGNOSIS — R269 Unspecified abnormalities of gait and mobility: Secondary | ICD-10-CM | POA: Diagnosis not present

## 2022-08-25 DIAGNOSIS — G20C Parkinsonism, unspecified: Secondary | ICD-10-CM | POA: Diagnosis not present

## 2022-08-29 ENCOUNTER — Telehealth: Payer: Self-pay | Admitting: Neurology

## 2022-08-29 NOTE — Telephone Encounter (Signed)
Please call patient, MRI of the brain showed advanced small vessel disease, generalized atrophy, there is also evidence of severe paranasal sinusitis  If she has any signs of excessive nasal discharge, behind on a headache, fever, postnasal drainage, may consider ENT evaluation  Will review MRI at next follow-up visit, please advise her to bring family member with her   IMPRESSION: MRI scan of the brain without contrast showing advanced changes of chronic small vessel disease and mild degree of generalized cerebral atrophy.  There are also incidental changes of chronic severe paranasal sinuses involving the right sphenoid sinus and bilateral ethmoid sinuses.

## 2022-08-29 NOTE — Telephone Encounter (Signed)
I called pt. No answer, left a message asking pt to call me back.   

## 2022-08-30 NOTE — Telephone Encounter (Signed)
Called and LVM rq CB 

## 2022-08-31 NOTE — Telephone Encounter (Signed)
Called and spoke to patient and daughter, reviewed results and they were understanding of when to reach out to [place] regarding the ENT. Daughter confirmed she will be at appointment with mother to review the MRI more in depth with Dr. Krista Blue.

## 2022-08-31 NOTE — Telephone Encounter (Signed)
Pt's daughter is asking for a call back when RN is available

## 2022-09-06 ENCOUNTER — Telehealth: Payer: Self-pay | Admitting: Neurology

## 2022-09-06 NOTE — Telephone Encounter (Signed)
Please call patient MRI of the brain in January 2024 showed advanced small vessel disease, generalized atrophy  I will see her in follow-up in February as scheduled for further discussion, review MRI films  IMPRESSION: MRI scan of the brain without contrast showing advanced changes of chronic small vessel disease and mild degree of generalized cerebral atrophy.  There are also incidental changes of chronic severe paranasal sinuses involving the right sphenoid sinus and bilateral ethmoid sinuses.

## 2022-09-08 ENCOUNTER — Telehealth: Payer: Self-pay

## 2022-09-08 NOTE — Patient Outreach (Signed)
  Care Coordination   09/08/2022 Name: Selena Bush MRN: [id number] DOB: 1945-09-19   Care Coordination Outreach Attempts:  An unsuccessful telephone outreach was attempted today to offer the patient information about available care coordination services as a benefit of their health plan.   Follow Up Plan:  Additional outreach attempts will be made to offer the patient care coordination information and services.   Encounter Outcome:  No Answer   Care Coordination Interventions:  No, not indicated    Quinn Plowman [facility] [place] [phone] direct line

## 2022-10-07 ENCOUNTER — Ambulatory Visit
Admission: RE | Admit: 2022-10-07 | Discharge: 2022-10-07 | Disposition: A | Discharge status: Home / Self Care | Payer: Medicare (Managed Care) | Source: Ambulatory Visit | Attending: Vascular Surgery | Admitting: Vascular Surgery

## 2022-10-07 ENCOUNTER — Other Ambulatory Visit: Payer: Self-pay | Admitting: Vascular Surgery

## 2022-10-07 DIAGNOSIS — M549 Dorsalgia, unspecified: Secondary | ICD-10-CM

## 2022-10-07 DIAGNOSIS — C5701 Malignant neoplasm of right fallopian tube: Secondary | ICD-10-CM

## 2022-10-10 ENCOUNTER — Ambulatory Visit (INDEPENDENT_AMBULATORY_CARE_PROVIDER_SITE_OTHER): Payer: Medicare (Managed Care) | Admitting: Neurology

## 2022-10-10 ENCOUNTER — Encounter: Payer: Self-pay | Admitting: Neurology

## 2022-10-10 VITALS — BP 111/70 | HR 54 | Ht 62.0 in

## 2022-10-10 DIAGNOSIS — I679 Cerebrovascular disease, unspecified: Secondary | ICD-10-CM | POA: Diagnosis not present

## 2022-10-10 DIAGNOSIS — G20C Parkinsonism, unspecified: Secondary | ICD-10-CM | POA: Diagnosis not present

## 2022-10-10 MED ORDER — CARBIDOPA-LEVODOPA ER 25-100 MG PO TBCR: 1.0000 | EXTENDED_RELEASE_TABLET | Freq: Every day | ORAL | 11 refills | Status: DC

## 2022-10-10 NOTE — Progress Notes (Signed)
Chief Complaint  Patient presents with   Follow-up    Rm 14. Accompanied by daughter. Patient reports she continues to struggle with memory. She has seen much improvement with stiffness, she does have stiffness in right leg. She reports a fall on 10/06/22.      ASSESSMENT AND PLAN  Selena Bush is a 77 y.o. female  Rapid progression of parkinsonian syndrome,  Significant gait abnormality, retropulse instability and relatively symmetric, no significant improvement with carbidopa levodopa  Both patient and daughter reported that she was doing better changed to Rytary 140 mg 3 tablets 3 times a day, unfortunately she fell October 08, 2022, has significant regression since, now at nursing home, receiving rehabilitation,  Also taking Sinemet CR 25/100 mg 1 tablet before bedtime for restless leg symptoms, refilled her prescription  Not typical for idiopathic Parkinson's disease, differentiation diagnoses also include progressive supranuclear palsy, and other Parkinson plus syndrome,  Discussed with patient and daughter decided to refer to [facility] movement specialist for second opinion  Cerebrovascular disease  MRI of brain in January 2024 reviewed with dense chronic small vessel disease, generalized atrophy,  Complete evaluation with echocardiogram, ultrasound of carotid artery,   History of paroxysmal atrial fibrillation, pericardial effusion  She was diagnosed with paroxysmal atrial fibrillation in March 2019, treated with sotalol and Xarelto, echocardiogram showed large pericardial effusion with tamponade, need pericardiocentesis on November 13, 2017, felt it was hemorrhagic pericardial effusion due to anticoagulation use, which has stopped, she remained in sinus rhythm, continued on sotalol and beta-blocker, no longer on anticoagulation or antiplatelet agent, at risk for fall,   DIAGNOSTIC DATA (LABS, IMAGING, TESTING) - I reviewed patient records, labs, notes, testing and imaging  myself where available.   MEDICAL HISTORY:  Selena Bush, is a 77 year old female, accompanied by her daughter and caregiver Holley Raring, seen in request by her primary care nurse practitioner Raynelle Fanning, for evaluation of gait abnormality, parkinsonian syndrome.  I reviewed and summarized the referring note. PMHX HLD DM Depression HTN A fib, -in sinus rhythm CHF Right Breast Cancer in 2014, s/p lobectomy and radiation therapy. OSA-on CPAP Knee replacement  Patient currently lives at home with her caregiver Holley Raring, who began to living with patient since August 2022.  Around 2020, she noticed intermittent left hand shaking, then began to involving right hand, rapid progression of gait abnormality, change of walking posturing, he was seen by Dr. Verne Carrow at [facility] system since January 2021, initial evaluation documented difficulty holding a cup of coffee, denies difficulty walking, no change of the taste or voice, patient declines medication initially  She was noted to have rapid progression, June 2021, patient was noted to have left hand resting tremor, stiffness, parkinsonism features, difficulty walking with shuffling gait, difficulty getting in and out of chair and the car, soft voice,  EMG nerve conduction study also confirmed mild left carpal tunnel syndrome  She was started on Sinemet 25/100 mg around that time, initially respond well, later visit in December 2021 Dr. Starleen Blue documented frequent fall, quick titration of Sinemet 25/100  to 7 tablet  Follow-up in 2022, patient remains 700 mg of levodopa, but still have significant gait abnormality, difficult to keep independent, increased falling,  Most recent visit with Dr. Starleen Blue was on January 13, 2022, Seroquel 25 mg was started titrating to 50 mg for evening time agitation, sleep disturbance, hallucinations, also on titrating dose of gabapentin for sleep, acting out of dreams, nightmares, visual hallucination of  people standing  house, can be scary for her  Patient came in with walker today, has increased gait abnormality, can only take few steps with walker and assistant, current on Sinemet 25/100 9 AM 2, p.m. 1, 12 1, 3 PM 2, 6 PM 1, 9 PM 2, (total immediate release 900 mg of levodopa), plus Sinemet ER 25/100 mg before bedtime  She continues to have trouble sleeping, difficulty going to and staying asleep, sleeping in recliner now, also feeling anxious, lost appetite, poor appetite, lost 60 pounds over past couple years, mostly hard in her sleep, vivid dreams,  She was given Abilify, Seroquel per family for hallucinations, but may has contribute to her insomnia, no longer taking it, she can only sleep 2 to 3 hours each time, frequent awakening, sedentary sleepiness during the day  UPDATE Oct 10 2022: She is accompanied by her daughter at today's clinical visit, since she was switched to Rytary 145 3 tablets 3 times a day in November 2023, both patient and daughter reported significant improvement, she was able to ambulate better, getting in and out of the car go to doctors office using walker  Unfortunately she fell on October 06, 2022, now stay at [place] for rehabilitation, there was no broken bone, but since that fall, she has significant regression of her gait, can barely bear weight with her right side, where she landed when she fall, complains of significant right knee pain,  She is also taking Sinemet  ER 25/100 mg every night for excessive leg movement during sleep, which has improved some as well, she sleeps in recliner, denies difficulty turning, taking melatonin before she go to sleep, sometimes Benadryl, but still wake up couple times in the middle of the night, sometimes difficulty going to sleep  She was noted to have mild memory loss, visual spatial disorientation, slow reaction time, MoCA examination is 23/30 today, she is a retired Education officer, museum,  Personally reviewed MRI of the brain  January 2024, advanced small vessel disease, also involving pons, generalized atrophy, no acute abnormality  She is enrolled in pace program, before her fall, she was intending pace program 4 times each week, has a lot of social interaction, regular PT OT, which has been very helpful,  PHYSICAL EXAM:   Vitals:   10/10/22 1301  BP: 111/70  Pulse: (!) 54  Height: '5\' 2"'$  (1.575 m)    Body mass index is 25.31 kg/m.  PHYSICAL EXAMNIATION:  Gen: NAD, conversant, well nourised, well groomed                     Cardiovascular: Regular rate rhythm, no peripheral edema, warm, nontender. Eyes: Conjunctivae clear without exudates or hemorrhage Neck: Supple, no carotid bruits. Pulmonary: Clear to auscultation bilaterally   NEUROLOGICAL EXAM:  MENTAL STATUS: Speech/cognition: Awake, alert, oriented to history taking and casual conversation    07/11/2022    2:37 PM  Montreal Cognitive Assessment   Visuospatial/ Executive (0/5) 2  Naming (0/3) 3  Attention: Read list of digits (0/2) 2  Attention: Read list of letters (0/1) 1  Attention: Serial 7 subtraction starting at 100 (0/3) 3  Language: Repeat phrase (0/2) 2  Language : Fluency (0/1) 0  Abstraction (0/2) 2  Delayed Recall (0/5) 3  Orientation (0/6) 5  Total 23  Adjusted Score (based on education) 23    CRANIAL NERVES: CN II: Visual fields are full to confrontation. Pupils are round equal and briskly reactive to light. CN III, IV, VI: Startled face  expression, smooth pursuit broke up into catchup saccade, difficulty with vertical eye movement CN V: Facial sensation is intact to light touch CN VII: Face is symmetric with normal eye closure  CN VIII: Hearing is normal to causal conversation. CN IX, X: Phonation is normal. CN XI: Head turning and shoulder shrug are intact  MOTOR: Anterocollis, maximum neck erection was 90 degree, no resting tremor, no weakness, fairly symmetric bilateral upper and lower extremity moderate  rigidity, bradykinesia Mild difficulty using her right leg, still able to raise right leg against gravity, and ankle dorsi plantarflexion against gravity  REFLEXES: Reflexes are 1  and symmetric at the biceps, triceps, knees, and ankles. Plantar responses are flexor.  SENSORY: Intact to light touch, pinprick and vibratory sensation are intact in fingers and toes.  COORDINATION: There is no trunk or limb dysmetria noted.  GAIT/STANCE: Need people help to get up from seated position, significant retropulse instability, difficulty initiate gait,   REVIEW OF SYSTEMS:  Full 14 system review of systems performed and notable only for as above All other review of systems were negative.   ALLERGIES: Allergies  Allergen Reactions   Penicillins Anaphylaxis    anaphylaxis  Has patient had a PCN reaction causing immediate rash, facial/tongue/throat swelling, SOB or lightheadedness with hypotension: Yes Has patient had a PCN reaction causing severe rash involving mucus membranes or skin necrosis: No Has patient had a PCN reaction that required hospitalization: No Has patient had a PCN reaction occurring within the last 10 years: No If all of the above answers are "NO", then may proceed with Cephalosporin use.    Sulfa Antibiotics Anaphylaxis   Codeine Other (See Comments) and Nausea And Vomiting    Gi problems    Statins Other (See Comments)    Leg pains   Xarelto [Rivaroxaban] Swelling    pericarditis   Aspirin Other (See Comments)    "burned my stomach intensely" Abdominal pain and burning    HOME MEDICATIONS: Current Outpatient Medications  Medication Sig Dispense Refill   acetaminophen (TYLENOL) 500 MG tablet Take 500 mg by mouth every 6 (six) hours as needed for moderate pain.     bisacodyl (DULCOLAX) 5 MG EC tablet Take 5 mg by mouth daily as needed for moderate constipation.     Carbidopa-Levodopa ER (RYTARY) 36.25-145 MG CPCR Take 3 tablets by mouth 3 (three) times daily.  (Patient taking differently: Take 3 tablets by mouth 3 (three) times daily. 7 am 12 pm 5 pm) 270 capsule 11   Cholecalciferol (VITAMIN D-3) 25 MCG (1000 UT) CAPS Take 1 capsule by mouth daily.     Cyanocobalamin (VITAMIN B-12) 5000 MCG SUBL Place 1 each under the tongue daily.     DULoxetine (CYMBALTA) 60 MG capsule TAKE ONE CAPSULE BY MOUTH TWICE DAILY FOR anxiety AND depression (Patient taking differently: Take 60 mg by mouth 2 (two) times daily. TAKE ONE CAPSULE BY MOUTH TWICE DAILY FOR anxiety AND depression) 180 capsule 0   ezetimibe (ZETIA) 10 MG tablet TAKE ONE TABLET BY MOUTH EVERY MORNING (Patient taking differently: Take 10 mg by mouth daily.) 90 tablet 3   fluticasone (FLONASE) 50 MCG/ACT nasal spray Place 2 sprays into both nostrils daily as needed for rhinitis or allergies. 48 g 3   furosemide (LASIX) 20 MG tablet TAKE ONE TABLET BY MOUTH ONCE DAILY May take additional tablet daily if needed (Patient taking differently: Take 40 mg by mouth 2 (two) times daily.) 180 tablet 3   gabapentin (NEURONTIN) 600 MG  tablet Take 600 mg by mouth as needed.     JARDIANCE 10 MG TABS tablet TAKE ONE TABLET BY MOUTH BEFORE BREAKFAST (Patient taking differently: Take 10 mg by mouth daily.) 30 tablet 5   Melatonin 5 MG CHEW Chew 10 mg by mouth at bedtime.     metoprolol tartrate (LOPRESSOR) 25 MG tablet TAKE THREE TABLETS BY MOUTH TWICE DAILY (Patient taking differently: Take 75 mg by mouth 2 (two) times daily.) 540 tablet 1   Multiple Vitamins-Minerals (CEROVITE SENIOR PO) Take 1 tablet by mouth daily.     oxybutynin (DITROPAN) 5 MG tablet Take 5 mg by mouth 2 (two) times daily.     sotalol (BETAPACE) 120 MG tablet TAKE ONE TABLET BY MOUTH TWICE DAILY (Patient taking differently: Take 120 mg by mouth 2 (two) times daily.) 180 tablet 3   spironolactone (ALDACTONE) 25 MG tablet Take 1 tablet (25 mg total) by mouth daily. 90 tablet 1   tiZANidine (ZANAFLEX) 4 MG tablet TAKE 2 TABLET (4 MG TOTAL) BY MOUTH  DAILY FOR MUSCLE SPASMS AT BEDTIME. (Patient taking differently: Take 4 mg by mouth in the morning and at bedtime.) 180 tablet 3   CARBIDOPA-LEVODOPA ER PO Take 25-100 mg by mouth daily.     No current facility-administered medications for this visit.    PAST MEDICAL HISTORY: Past Medical History:  Diagnosis Date   Anxiety    Atrial fibrillation ([place])    Cat bite of right hand 03/25/2019   Chronic diastolic CHF (congestive heart failure) ([place])    a. echo 09/2014: EF [id number], diastolic dysfunction, mild LVH, nl RV size & systolic function, mildly dilated LA (4.3 cm), mild MR/TR, mildly elevated PASP 36.7 mm Hg   DDD (degenerative disc disease), cervical    DDD (degenerative disc disease), lumbar    Depression    Diffuse cystic mastopathy 2014   Dyspnea    Dysrhythmia    GERD (gastroesophageal reflux disease)    Gout    Headache    rare   HLD (hyperlipidemia)    a. statin intolerant 2/2 myalgias   Hx of dysplastic nevus 12/25/2018   L medial ankle   Hypercholesterolemia    Hypertension    Malignant neoplasm of upper-outer quadrant of female breast ([place]) 10/2012   Papillary DCIS, sentinel node negative. DR/PR positive. PARTIAL RIGHT MASTECTOMY FOR BREAST CANCER--HAD RADIATION - NO CHEMO --DR. CRYSTAL Blossom ONCOLOGIST   Obesity    OSA on CPAP    Osteoarthritis of both knees    a. s/p right TKA 04/2013 & left TKA 09/2014   Otitis externa    Parkinson disease    Personal history of radiation therapy 2015   RIGHT breast-mammosite per pt   Pneumonia of both lungs due to infectious organism 01/08/2021   Sleep difficulties    LUNESTA HAS HELPED   Vaginal cyst     PAST SURGICAL HISTORY: Past Surgical History:  Procedure Laterality Date   ABDOMINAL HYSTERECTOMY  1992   DUB; fibroids; endometriosis.  One remaining ovary.     BREAST LUMPECTOMY Right 2015   Papillary DCIS, sentinel node negative. DR/PR positive. PARTIAL RIGHT MASTECTOMY FOR BREAST CANCER--HAD RADIATION - NO CHEMO  --DR. CRYSTAL [place] ONCOLOGIST   BREAST SURGERY Right 10/2012   Wide excision,APB RT 10 mm papillary DCIS, ER/PR positive. Sentinel node negative. Partial breast radiation.   CATARACT EXTRACTION, BILATERAL  02/13/2016   [facility].   CHOLECYSTECTOMY  1994   COLONOSCOPY  2015   1 benign polyp-every 5 years/  Dr Candace Cruise   COLONOSCOPY WITH PROPOFOL N/A 02/10/2021   Procedure: COLONOSCOPY WITH PROPOFOL;  Surgeon: Robert Bellow, MD;  Location: [facility] ENDOSCOPY;  Service: Endoscopy;  Laterality: N/A;   ERCP  1995   JOINT REPLACEMENT Right 04/2013   knee   PERICARDIOCENTESIS N/A 11/13/2017   Procedure: PERICARDIOCENTESIS;  Surgeon: Nelva Bush, MD;  Location: [place] CV LAB;  Service: Cardiovascular;  Laterality: N/A;   TOTAL KNEE ARTHROPLASTY Right 04/16/2013   Procedure: RIGHT TOTAL KNEE ARTHROPLASTY;  Surgeon: Mauri Pole, MD;  Location: [facility] ORS;  Service: Orthopedics;  Laterality: Right;   TOTAL KNEE ARTHROPLASTY Left 09/15/2014   Procedure: LEFT TOTAL KNEE ARTHROPLASTY;  Surgeon: Mauri Pole, MD;  Location: [facility] ORS;  Service: Orthopedics;  Laterality: Left;   TUBAL LIGATION  1979   UPPER GI ENDOSCOPY      FAMILY HISTORY: Family History  Problem Relation Age of Onset   Colon cancer Mother    Cancer Mother 79       colon cancer   Melanoma Father    Cancer Father 57       melanoma   Colon cancer Maternal Grandfather    Breast cancer Maternal Grandfather    Melanoma Sister    Melanoma Sister     SOCIAL HISTORY: Social History   Socioeconomic History   Marital status: Widowed    Spouse name: Chrissie Noa   Number of children: 2   Years of education: College   Highest education level: Bachelor's degree (e.g., BA, AB, BS)  Occupational History   Occupation: Retired  Tobacco Use   Smoking status: Never   Smokeless tobacco: Never   Tobacco comments:    social smoker as a teen  Scientific laboratory technician Use: Never used  Substance and Sexual Activity   Alcohol use: No    Drug use: No   Sexual activity: Not Currently    Birth control/protection: Post-menopausal, Surgical  Other Topics Concern   Not on file  Social History Narrative   Marital status: widowed since 09/16/2015      Children: 2 children (90, 78); 5 grandchild (4 in [place]; 1 in [place]).      Lives: alone in townhome; 1 dog, 2 cats      Employment: psychiatric Education officer, museum; retired in 2015      Tobacco: teenager only      Alcohol:  None      Drugs: none      Exercise: walking in 2019; more active in 2019.  Walking daily small amounts.        ADLs: drives; independent with ADLs; no assistant devices      Advanced Directives: none; FULL CODE; no prolonged measures.   Does not need them.  Daughter/Heather Vista Deck is HCPOA.    Social Determinants of Health   Financial Resource Strain: Low Risk  (01/06/2022)   Overall Financial Resource Strain (CARDIA)    Difficulty of Paying Living Expenses: Not hard at all  Food Insecurity: No Food Insecurity (01/06/2022)   Hunger Vital Sign    Worried About Running Out of Food in the Last Year: Never true    Ran Out of Food in the Last Year: Never true  Transportation Needs: No Transportation Needs (01/06/2022)   PRAPARE - Hydrologist (Medical): No    Lack of Transportation (Non-Medical): No  Physical Activity: Insufficiently Active (01/06/2022)   Exercise Vital Sign    Days of Exercise per Week: 7 days  Minutes of Exercise per Session: 10 min  Stress: No Stress Concern Present (01/06/2022)   [place]    Feeling of Stress : Not at all  Social Connections: Moderately Integrated (10/02/2017)   Social Connection and Isolation Panel [NHANES]    Frequency of Communication with Friends and Family: More than three times a week    Frequency of Social Gatherings with Friends and Family: More than three times a week    Attends Religious Services: More than 4 times per  year    Active Member of [employer] or Organizations: Yes    Attends Archivist Meetings: More than 4 times per year    Marital Status: Widowed  Intimate Partner Violence: Not At Risk (10/02/2017)   Humiliation, Afraid, Rape, and Kick questionnaire    Fear of Current or Ex-Partner: No    Emotionally Abused: No    Physically Abused: No    Sexually Abused: No    Total time spent reviewing the chart, obtaining history, examined patient, ordering tests, documentation, consultations and family, care coordination was 50 minutes  Marcial Pacas, M.D. Ph.D.  [facility] [address], [place], [place] [address] Ph: [phone] Fax: [phone]  CC:  No referring provider defined for this encounter.  Pcp, No

## 2022-10-10 NOTE — Patient Instructions (Addendum)
Carbidopa-Levodopa ER (SINEMET CR) 25-100 MG tablet controlled release      Rytary '145mg'$  3 tabs three times a day.

## 2022-10-11 ENCOUNTER — Telehealth: Payer: Self-pay | Admitting: Neurology

## 2022-10-11 ENCOUNTER — Telehealth: Payer: Medicare HMO

## 2022-10-11 NOTE — Telephone Encounter (Signed)
Referral for neurology fax to [facility]. Phone: [phone].

## 2022-10-13 ENCOUNTER — Telehealth: Payer: Self-pay | Admitting: Neurology

## 2022-10-13 NOTE — Telephone Encounter (Signed)
[facility] Selena Bush) Pt told us neurologist approved her to take Benadryl. Would like verify this with the neurologist approved.  [phone]

## 2022-10-13 NOTE — Telephone Encounter (Signed)
Called and spoke to [place] at [facility], and she stated that the patient called with caregiver and stated that the patient was taking '50mg'$  of benadryl each night. I spoke with Dr. Krista Blue and she recommended just PRN, it should not be taken every day and NP agreed and was grateful for the recommendation and call back

## 2022-10-17 ENCOUNTER — Telehealth: Payer: Self-pay | Admitting: Cardiology

## 2022-10-17 NOTE — Telephone Encounter (Signed)
PCP requesting a callback in regards to pt being able to take Benadryl at night for sleep. She's needing clarification. Please advise.

## 2022-10-17 NOTE — Telephone Encounter (Signed)
Returned call to PCP [place]) who states that patient told her that Dr. Stanford Breed recommended she take Benadryl, '50mg'$  for bed time to help her sleep . Advised her that this was not a recommendation in  Dr. Jacalyn Lefevre note. [place] states that due to patients parkinson's and age she was looking into Doxipene or Trazodone for patient for sleep. [place] states that she wants to know if Dr. Stanford Breed thinks one of these would be okay for patient. Advised I would forward message over.

## 2022-10-21 NOTE — Telephone Encounter (Signed)
My biggest concern is her falls risk with sedation and the anticholinergic effects of all 3 agents. She is already on oxybutynin which has anticholinergic properties. Not overly concerned about any of these effects on heart. None of them should be used scheduled and use should be limited.

## 2022-10-21 NOTE — Telephone Encounter (Signed)
Will forward for pharm md to review

## 2022-10-24 NOTE — Telephone Encounter (Signed)
Spoke with brittney, aware of the recommendations.

## 2022-11-02 ENCOUNTER — Encounter: Payer: Self-pay | Admitting: Neurology

## 2022-11-02 NOTE — Telephone Encounter (Addendum)
LVM on 11/02/22 at 3:56pm. [facility] Marland KitchenClemens Catholic, NP) Primary care for pt. Requesting  urgent follow up. We referred her to [facility] Neurology per Dr. Krista Blue recommendation for need to follow up with movement specialist. Dr Hall Busing  actually declined as she was previously already folllowed  by [facility]. So she does not feel that she needs another neurologist. See that Dr. Rexene Alberts is a movement specialist as well and she is within your practice. Wanted to see if there is anyway we could have her setup urgently follow up with Dr. Rexene Alberts. Because she is having some rapidly progressing decline in her function that is not responsing to physical therapy. We were concerned about may have more of a progressive parkinsonian, more rapid deteriorating neurodegenerative disorder. Can contact me at [phone]

## 2022-11-15 ENCOUNTER — Ambulatory Visit (HOSPITAL_COMMUNITY)
Admission: RE | Admit: 2022-11-15 | Discharge: 2022-11-15 | Disposition: A | Discharge status: Home / Self Care | Payer: Medicare (Managed Care) | Source: Ambulatory Visit | Attending: Neurology | Admitting: Neurology

## 2022-11-15 ENCOUNTER — Ambulatory Visit (HOSPITAL_BASED_OUTPATIENT_CLINIC_OR_DEPARTMENT_OTHER)
Admission: RE | Admit: 2022-11-15 | Discharge: 2022-11-15 | Disposition: A | Discharge status: Home / Self Care | Payer: Medicare (Managed Care) | Source: Ambulatory Visit | Attending: Neurology | Admitting: Neurology

## 2022-11-15 DIAGNOSIS — I359 Nonrheumatic aortic valve disorder, unspecified: Secondary | ICD-10-CM | POA: Diagnosis not present

## 2022-11-15 DIAGNOSIS — G20C Parkinsonism, unspecified: Secondary | ICD-10-CM | POA: Diagnosis not present

## 2022-11-15 DIAGNOSIS — I6789 Other cerebrovascular disease: Secondary | ICD-10-CM | POA: Diagnosis not present

## 2022-11-15 DIAGNOSIS — I110 Hypertensive heart disease with heart failure: Secondary | ICD-10-CM | POA: Insufficient documentation

## 2022-11-15 DIAGNOSIS — I4891 Unspecified atrial fibrillation: Secondary | ICD-10-CM | POA: Diagnosis not present

## 2022-11-15 DIAGNOSIS — I3481 Nonrheumatic mitral (valve) annulus calcification: Secondary | ICD-10-CM | POA: Diagnosis not present

## 2022-11-15 DIAGNOSIS — E785 Hyperlipidemia, unspecified: Secondary | ICD-10-CM | POA: Diagnosis not present

## 2022-11-15 DIAGNOSIS — I679 Cerebrovascular disease, unspecified: Secondary | ICD-10-CM | POA: Insufficient documentation

## 2022-11-15 DIAGNOSIS — I509 Heart failure, unspecified: Secondary | ICD-10-CM | POA: Insufficient documentation

## 2022-11-15 LAB — ECHOCARDIOGRAM COMPLETE
AV Mean grad: 8 mmHg
Area-P 1/2: 3.16 cm2
Calc EF: 69.6 %
S' Lateral: 2.1 cm
Single Plane A2C EF: 67.1 %
Single Plane A4C EF: 71.9 %

## 2022-11-15 NOTE — Progress Notes (Signed)
Echocardiogram 2D Echocardiogram has been performed.  Selena Bush 11/15/2022, 11:16 AM

## 2022-11-15 NOTE — Progress Notes (Signed)
Bilateral carotid ultrasound study completed.   Please see CV Procedures for preliminary results.  Saba Gomm, RVT  10:23 AM 11/15/22

## 2022-11-16 NOTE — Telephone Encounter (Signed)
  Signed      Referral for neurology fax to [facility]. Phone: [phone].         She was referred to [facility], not [facility], It might take a while for her to be on schedule.

## 2022-11-21 NOTE — Telephone Encounter (Signed)
Selena Bush   10/11/22  3:20 PM Note Referral for neurology fax to [facility]. Phone: [phone],Fax: [phone].     per tat ok/miller,brittany/[facility] wanted pt to have 2nd opinion at [facility] but [facility] doesn't go that far.   Annie sent the referral to [facility] on 10/11/22. Please see appt notes for pt's appt with [place], [facility] does not travel far enough to take her to [facility]. Would you like for her to f/u with Dr. Frances Furbish instead?

## 2022-12-12 NOTE — Progress Notes (Signed)
Assessment/Plan:   1.  Parkinsons disease, diagnosed 2021  -I do not think that the patient has an atypical state.  She does not describe the red flags, with the exception of diplopia and that did not develop for several years (3) into the disease.  I do not think the neck flexion is associated with Parkinson's, although it could be.  I think this is more orthopedic in nature.  She has apparently had that for many years.  -Patient is currently on Rytary 145 mg, 3 capsules 3 times per day.  -Patient is currently on carbidopa/levodopa 25/100 CR at bedtime.  Ultimately, that 1 can probably be changed to Rytary, but we were addressing other things today.  2.  Right leg weakness  -It is unclear if the patient's leg became weak and then she fell, or if she fell first and then the right leg did not work well.  Nonetheless, she cannot walk well now, but was able to walk prior to her fall in February, 2024.  This all occurred within a single day time span.  Discussed with patient and daughter that it would not be Parkinson's disease that would cause this.  I think we need to do an MRI of the brain.  She had 1 done in January, but that was prior to the fall.  We will be able to use that one for comparison.  -If the above is negative, we will consider MRI cervical and lumbar spine  3.  Low bP, likely neurogenic orthostatic hypotension  -Certainly worsened by metoprolol.   -Discussed with them the concept of permissive hypertension in Parkinson's disease. -Will be especially important to get this under better control if she is able to get up and ambulate again.   Subjective:   Selena Bush was seen today in the movement disorders clinic for neurologic consultation.  Daughter supplements hx.  The evaluation is to rule out an atypical state.  Many medical records are reviewed.  Patient first presented to [facility] clinic and was seen by the nurse practitioner there in January, 2021.  Symptoms at that  point were left arm tremor for the prior 9 to 11 months.  She was sent for EMG to "rule out nerve damage located in her left arm."  She had that done in May, 2021 and it was felt she had mild left carpal tunnel.  A month later, in June, 2021 she came back and saw Dr. Malvin Johns who felt she had Parkinsons disease.  She was started on carbidopa/levodopa 25/100, 1 tablet 3 times per day.  Pt states that it was initially helpful for tremors and sleeping and aching in the arms/legs.  Her daughter states that she never had significant tremor.  By the end of 2021, the patient was on 7 tablets of levodopa, spread throughout the day.  Quetiapine was added at bedtime in May, 2023 for sleep.  She transferred care to [facility] neurology in November, 2023 and has been seeing Dr. Terrace Arabia.  Dr. Zannie Cove first notes state that patient's history was not "typical idiopathic Parkinson's disease."  She felt that patient could have PSP.  She started her on Rytary 145 mg, 3 capsules 3 times per day and kept carbidopa/levodopa 25/100 CR at bedtime.  Unfortunately, the patient fell in February, 2024 (pt states had her walker and just fell to the side, she thinks) and declined significantly after that.  Patient states that she had her walker with her and fell to her side and  had to lay on the side for over 30 minutes before EMS came.  Her right leg would not work per patient.  [facility] that [facility] and her daughter states that they x-rayed the ankle because the right leg was not working. [facility] noted that patient could not walk, and previously had been able to and subsequently sent her to SNF at [facility].  She was not evaluated at the hospital.  She had an appt 4 days later with Dr. Terrace Arabia.  Daughter states that they reviewed the MRI done prior to the fall and did a carotid u/s.  No imaging was ordered after the fall.   She could stand when she left SNF but she couldn't walk but she was able to raise the R leg after she worked for a month and rehab.   However, she can no longer stand now that she is home and seems to have backtracked.   She has a full time caregiver with additional homehealth aides 7am-9am, 6pm-8pm, weekdays and 9-noon Saturday and 6-8pm Saturday and an aide Sunday night, 6-8pm.  Dr. Terrace Arabia wanted a second opinion and she was referred to [facility], but that was out of network for [facility] and patient was referred here.     Specific Symptoms:  Tremor: L hand ; now better with levodopa Family hx of similar:  No. Voice: weaker Sleep:   Vivid Dreams:  Yes.  , "some are disturbing'  Acting out dreams:  only some talking Wet Pillows: No. Bradykinesia symptoms: drooling while awake; no longer able to ambulate or get up on own; neck has been flexed for many years, long prior to the diagnosis of Parkinson's, but she just started leaning to the right after the February fall Loss of smell:  yes Loss of taste:  yes Urinary Incontinence:  No.; she wears diaper now though b/c cannot get OOC to get to the bathroom.  Had no incontinence until she fell Difficulty Swallowing:  Yes.    Handwriting, micrographia: Yes.   Trouble with ADL's:  Yes.    Trouble buttoning clothing: Yes.   Memory changes:  No. Hallucinations:  No. (Had some 2 years ago associated with lack of sleep but once started sleeping, those resolved)  visual distortions: Yes.   N/V:  No. Lightheaded:  No.  Syncope: No. Diplopia:  happens some daily.      ALLERGIES:   Allergies  Allergen Reactions   Penicillins Anaphylaxis    anaphylaxis  Has patient had a PCN reaction causing immediate rash, facial/tongue/throat swelling, SOB or lightheadedness with hypotension: Yes Has patient had a PCN reaction causing severe rash involving mucus membranes or skin necrosis: No Has patient had a PCN reaction that required hospitalization: No Has patient had a PCN reaction occurring within the last 10 years: No If all of the above answers are "NO", then may  proceed with Cephalosporin use.    Sulfa Antibiotics Anaphylaxis   Codeine Other (See Comments) and Nausea And Vomiting    Gi problems    Statins Other (See Comments)    Leg pains   Xarelto [Rivaroxaban] Swelling    pericarditis   Aspirin Other (See Comments)    "burned my stomach intensely" Abdominal pain and burning    CURRENT MEDICATIONS:  Current Meds  Medication Sig   acetaminophen (TYLENOL) 500 MG tablet Take 500 mg by mouth every 6 (six) hours as needed for moderate pain.   bisacodyl (DULCOLAX) 5 MG EC tablet Take  5 mg by mouth daily as needed for moderate constipation.   Carbidopa-Levodopa ER (RYTARY) 36.25-145 MG CPCR Take 3 tablets by mouth 3 (three) times daily. (Patient taking differently: Take 3 tablets by mouth 3 (three) times daily. 7 am 12 pm 5 pm)   Carbidopa-Levodopa ER (SINEMET CR) 25-100 MG tablet controlled release Take 1 tablet by mouth at bedtime.   Cholecalciferol (VITAMIN D-3) 25 MCG (1000 UT) CAPS Take 1 capsule by mouth daily.   Cyanocobalamin (VITAMIN B-12) 5000 MCG SUBL Place 1 each under the tongue daily.   DULoxetine (CYMBALTA) 60 MG capsule TAKE ONE CAPSULE BY MOUTH TWICE DAILY FOR anxiety AND depression (Patient taking differently: Take 60 mg by mouth 2 (two) times daily. TAKE ONE CAPSULE BY MOUTH TWICE DAILY FOR anxiety AND depression)   ezetimibe (ZETIA) 10 MG tablet TAKE ONE TABLET BY MOUTH EVERY MORNING (Patient taking differently: Take 10 mg by mouth daily.)   fluticasone (FLONASE) 50 MCG/ACT nasal spray Place 2 sprays into both nostrils daily as needed for rhinitis or allergies.   furosemide (LASIX) 20 MG tablet TAKE ONE TABLET BY MOUTH ONCE DAILY May take additional tablet daily if needed (Patient taking differently: Take 40 mg by mouth 2 (two) times daily.)   gabapentin (NEURONTIN) 600 MG tablet Take 600 mg by mouth as needed.   JARDIANCE 10 MG TABS tablet TAKE ONE TABLET BY MOUTH BEFORE BREAKFAST (Patient taking differently: Take 10 mg by mouth  daily.)   Melatonin 5 MG CHEW Chew 10 mg by mouth at bedtime.   metoprolol tartrate (LOPRESSOR) 25 MG tablet TAKE THREE TABLETS BY MOUTH TWICE DAILY (Patient taking differently: Take 50 mg by mouth 2 (two) times daily.)   Multiple Vitamins-Minerals (CEROVITE SENIOR PO) Take 1 tablet by mouth daily.   oxybutynin (DITROPAN) 5 MG tablet Take 5 mg by mouth 2 (two) times daily.   sotalol (BETAPACE) 120 MG tablet TAKE ONE TABLET BY MOUTH TWICE DAILY (Patient taking differently: Take 120 mg by mouth 2 (two) times daily.)   spironolactone (ALDACTONE) 25 MG tablet Take 1 tablet (25 mg total) by mouth daily.   tiZANidine (ZANAFLEX) 4 MG tablet TAKE 2 TABLET (4 MG TOTAL) BY MOUTH DAILY FOR MUSCLE SPASMS AT BEDTIME. (Patient taking differently: Take 4 mg by mouth in the morning and at bedtime.)     Objective:   VITALS:   Vitals:   12/19/22 1001  BP: 90/60  Pulse: 72  SpO2: 98%  Weight: 144 lb (65.3 kg)  Height: 5\' 5"  (1.651 m)    GEN:  The patient appears stated age and is in NAD. HEENT:  Normocephalic, atraumatic.  The mucous membranes are moist. The superficial temporal arteries are without ropiness or tenderness. CV:  RRR Lungs:  CTAB Neck/HEME:  There are no carotid bruits bilaterally.  Claudie Fisherman is on the chest.  Neurological examination:  Orientation: The patient is alert and oriented x3.  Cranial nerves: There is good facial symmetry. Extraocular muscles are intact. The visual fields are full to confrontational testing. The speech is fluent and clear. Soft palate rises symmetrically and there is no tongue deviation. Hearing is intact to conversational tone. Sensation: Sensation is intact to light and pinprick throughout (facial, trunk, extremities). Vibration is intact at the bilateral big toe. There is no extinction with double simultaneous stimulation. There is no sensory dermatomal level identified. Motor: Strength is 5/5 in the bilateral upper and lower extremities.  While she had trouble  with hip flexion on the right initially, when we  did manual motor testing, strength was 5/5.  Shoulder shrug is equal and symmetric.  There is no pronator drift.  She does list to the right within her wheelchair Deep tendon reflexes: Deep tendon reflexes are 0-1/4 at the bilateral biceps, triceps, brachioradialis, patella and achilles. Plantar responses are downgoing bilaterally.  Movement examination: Tone: There is normal tone in the bilateral upper extremities.  The tone in the lower extremities is normal.  Abnormal movements: None seen today Coordination:  There is no real decremation with rapid alternating movements, but finger taps are slow bilaterally, right worse than left Gait and Station: Unable per patient and daughter I have reviewed and interpreted the following labs independently   Chemistry      Component Value Date/Time   NA 137 06/05/2022 0203   NA 142 05/21/2021 1505   NA 136 09/22/2014 0518   K 3.7 06/05/2022 0203   K 3.6 09/22/2014 0518   CL 104 06/05/2022 0203   CL 97 (L) 09/22/2014 0518   CO2 26 06/05/2022 0203   CO2 29 09/22/2014 0518   BUN 20 06/05/2022 0203   BUN 20 05/21/2021 1505   BUN 13 09/22/2014 0518   CREATININE 0.98 06/05/2022 0203   CREATININE 1.19 (H) 06/07/2016 1450      Component Value Date/Time   CALCIUM 9.6 06/05/2022 0203   CALCIUM 8.4 (L) 09/22/2014 0518   ALKPHOS 107 05/21/2021 1505   AST 12 05/21/2021 1505   ALT 5 05/21/2021 1505   BILITOT 0.3 05/21/2021 1505      Lab Results  Component Value Date   TSH 1.780 05/21/2021   Lab Results  Component Value Date   WBC 8.9 06/05/2022   HGB 13.6 06/05/2022   HCT 42.6 06/05/2022   MCV 99.8 06/05/2022   PLT 215 06/05/2022     Total time spent on today's visit was 60 minutes, including both face-to-face time and nonface-to-face time.  Time included that spent on review of records (prior notes available to me/labs/imaging if pertinent), discussing treatment and goals, answering  patient's questions and coordinating care.  Cc:  Pcp, No

## 2022-12-19 ENCOUNTER — Encounter: Payer: Self-pay | Admitting: Neurology

## 2022-12-19 ENCOUNTER — Ambulatory Visit (INDEPENDENT_AMBULATORY_CARE_PROVIDER_SITE_OTHER): Payer: Medicare (Managed Care) | Admitting: Neurology

## 2022-12-19 VITALS — BP 90/60 | HR 72 | Ht 65.0 in | Wt 144.0 lb

## 2022-12-19 DIAGNOSIS — R29898 Other symptoms and signs involving the musculoskeletal system: Secondary | ICD-10-CM

## 2022-12-19 DIAGNOSIS — G903 Multi-system degeneration of the autonomic nervous system: Secondary | ICD-10-CM

## 2022-12-19 DIAGNOSIS — G20A1 Parkinson's disease without dyskinesia, without mention of fluctuations: Secondary | ICD-10-CM

## 2022-12-19 NOTE — Patient Instructions (Signed)
A referral to [facility] has been placed for your MRI someone will contact you directly to schedule your appt. They are located at [address] Please contact them directly by calling [phone] with any questions regarding your referral.  

## 2022-12-20 ENCOUNTER — Telehealth: Payer: Self-pay | Admitting: Neurology

## 2022-12-20 NOTE — Telephone Encounter (Signed)
LVM and sent mychart msg informing pt of need to reschedule 03/07/23 appointment - MD out

## 2022-12-27 ENCOUNTER — Ambulatory Visit: Payer: Medicare (Managed Care) | Admitting: Neurology

## 2023-01-03 ENCOUNTER — Ambulatory Visit
Admission: RE | Admit: 2023-01-03 | Discharge: 2023-01-03 | Disposition: A | Discharge status: Home / Self Care | Payer: No Typology Code available for payment source | Source: Ambulatory Visit | Attending: Family Medicine | Admitting: Family Medicine

## 2023-01-03 ENCOUNTER — Other Ambulatory Visit: Payer: Self-pay | Admitting: Family Medicine

## 2023-01-03 DIAGNOSIS — R0789 Other chest pain: Secondary | ICD-10-CM

## 2023-01-04 ENCOUNTER — Telehealth: Payer: Self-pay | Admitting: Cardiology

## 2023-01-04 NOTE — Telephone Encounter (Signed)
Pt c/o of Chest Pain: STAT if CP now or developed within 24 hours  1. Are you having CP right now?   2. Are you experiencing any other symptoms (ex. SOB, nausea, vomiting, sweating)?  No   3. How long have you been experiencing CP?  Occurred on 5/20  4. Is your CP continuous or coming and going?  Tonye Becket, NP (PCP), reports the patient developed CP on 5/20. Patient assumed it was due to anxiety because when they arrived she was no longer in pain and declined transport. NP saw patient yesterday and she hasn't had any more CP. Patient requested troponin and it was a little elevated at 20. NP plans to fax EKG/labs to the office now + would like to discuss any recommendations from Dr. Jens Som.  5. Have you taken Nitroglycerin?  No

## 2023-01-04 NOTE — Telephone Encounter (Signed)
Paperwork received and will be reviewed by provider tomorrow when he returns to clinic.

## 2023-01-04 NOTE — Telephone Encounter (Signed)
Please call and speak with NP regarding patient issues.

## 2023-01-05 NOTE — Telephone Encounter (Signed)
Dr. Jens Som reviewed blood work/ ECG- recommends follow up with APP.   Attempted to call patient, left message for patient to call back to office.

## 2023-01-06 NOTE — Telephone Encounter (Signed)
Left message for pt to call to schedule follow up appointment. 

## 2023-01-10 NOTE — Telephone Encounter (Signed)
Follow up scheduled

## 2023-01-12 ENCOUNTER — Encounter: Payer: Self-pay | Admitting: Nurse Practitioner

## 2023-01-12 ENCOUNTER — Ambulatory Visit: Payer: Medicare (Managed Care) | Attending: Nurse Practitioner | Admitting: Nurse Practitioner

## 2023-01-12 VITALS — BP 100/90 | HR 53 | Ht 65.0 in | Wt 140.0 lb

## 2023-01-12 DIAGNOSIS — R079 Chest pain, unspecified: Secondary | ICD-10-CM | POA: Diagnosis not present

## 2023-01-12 DIAGNOSIS — I5032 Chronic diastolic (congestive) heart failure: Secondary | ICD-10-CM | POA: Diagnosis not present

## 2023-01-12 DIAGNOSIS — I350 Nonrheumatic aortic (valve) stenosis: Secondary | ICD-10-CM | POA: Diagnosis not present

## 2023-01-12 DIAGNOSIS — I6523 Occlusion and stenosis of bilateral carotid arteries: Secondary | ICD-10-CM

## 2023-01-12 DIAGNOSIS — I10 Essential (primary) hypertension: Secondary | ICD-10-CM

## 2023-01-12 DIAGNOSIS — I480 Paroxysmal atrial fibrillation: Secondary | ICD-10-CM

## 2023-01-12 DIAGNOSIS — E785 Hyperlipidemia, unspecified: Secondary | ICD-10-CM

## 2023-01-12 DIAGNOSIS — G20C Parkinsonism, unspecified: Secondary | ICD-10-CM

## 2023-01-12 NOTE — Patient Instructions (Signed)
Medication Instructions:  Your physician recommends that you continue on your current medications as directed. Please refer to the Current Medication list given to you today.  *If you need a refill on your cardiac medications before your next appointment, please call your pharmacy*   Lab Work: NONE ordered at this time of appointment   Testing/Procedures: NONE ordered at this time of appointment     Follow-Up: At [facility], you and your health needs are our priority.  As part of our continuing mission to provide you with exceptional heart care, we have created designated Provider Care Teams.  These Care Teams include your primary Cardiologist (physician) and Advanced Practice Providers (APPs -  Physician Assistants and Nurse Practitioners) who all work together to provide you with the care you need, when you need it.  We recommend signing up for the patient portal called "MyChart".  Sign up information is provided on this After Visit Summary.  MyChart is used to connect with patients for Virtual Visits (Telemedicine).  Patients are able to view lab/test results, encounter notes, upcoming appointments, etc.  Non-urgent messages can be sent to your provider as well.   To learn more about what you can do with MyChart, go to [email or web address].    Your next appointment:    3-4 months Bernadene Person NP) 6 months (Dr. Jens Som)  Provider:   Olga Millers, MD  or Bernadene Person, NP        Other Instructions

## 2023-01-12 NOTE — Progress Notes (Signed)
Office Visit    Patient Name: Selena Bush Date of Encounter: 01/12/2023  Primary Care Provider:  Pcp, No Primary Cardiologist:  Olga Millers, MD  Chief Complaint    77 year old female with a history of chronic diastolic heart failure, paroxysmal atrial fibrillation not on anticoagulation given history of hemorrhagic pericardial effusion, aortic stenosis, hypertension, hyperlipidemia, carotid artery stenosis and Parkinsonism who presents for follow-up related to chest pain.  Past Medical History    Past Medical History:  Diagnosis Date   Anxiety    Atrial fibrillation (HCC)    Cat bite of right hand 03/25/2019   Chronic diastolic CHF (congestive heart failure) (HCC)    a. echo 09/2014: EF 60-65%, diastolic dysfunction, mild LVH, nl RV size & systolic function, mildly dilated LA (4.3 cm), mild MR/TR, mildly elevated PASP 36.7 mm Hg   DDD (degenerative disc disease), cervical    DDD (degenerative disc disease), lumbar    Depression    Diffuse cystic mastopathy 2014   Dyspnea    Dysrhythmia    GERD (gastroesophageal reflux disease)    Gout    Headache    rare   HLD (hyperlipidemia)    a. statin intolerant 2/2 myalgias   Hx of dysplastic nevus 12/25/2018   L medial ankle   Hypercholesterolemia    Hypertension    Malignant neoplasm of upper-outer quadrant of female breast (HCC) 10/2012   Papillary DCIS, sentinel node negative. DR/PR positive. PARTIAL RIGHT MASTECTOMY FOR BREAST CANCER--HAD RADIATION - NO CHEMO --DR. CRYSTAL Parrish ONCOLOGIST   Obesity    OSA on CPAP    Osteoarthritis of both knees    a. s/p right TKA 04/2013 & left TKA 09/2014   Otitis externa    Parkinson disease    Personal history of radiation therapy 2015   RIGHT breast-mammosite per pt   Pneumonia of both lungs due to infectious organism 01/08/2021   Sleep difficulties    LUNESTA HAS HELPED   Vaginal cyst    Past Surgical History:  Procedure Laterality Date   ABDOMINAL HYSTERECTOMY  1992    DUB; fibroids; endometriosis.  One remaining ovary.     BREAST LUMPECTOMY Right 2015   Papillary DCIS, sentinel node negative. DR/PR positive. PARTIAL RIGHT MASTECTOMY FOR BREAST CANCER--HAD RADIATION - NO CHEMO --DR. CRYSTAL [place] ONCOLOGIST   BREAST SURGERY Right 10/2012   Wide excision,APB RT 10 mm papillary DCIS, ER/PR positive. Sentinel node negative. Partial breast radiation.   CATARACT EXTRACTION, BILATERAL  02/13/2016   [facility].   CHOLECYSTECTOMY  1994   COLONOSCOPY  2015   1 benign polyp-every 5 years/ Dr Bluford Kaufmann   COLONOSCOPY WITH PROPOFOL N/A 02/10/2021   Procedure: COLONOSCOPY WITH PROPOFOL;  Surgeon: Earline Mayotte, MD;  Location: [facility] ENDOSCOPY;  Service: Endoscopy;  Laterality: N/A;   ERCP  1995   JOINT REPLACEMENT Right 04/2013   knee   PERICARDIOCENTESIS N/A 11/13/2017   Procedure: PERICARDIOCENTESIS;  Surgeon: Yvonne Kendall, MD;  Location: [facility] INVASIVE CV LAB;  Service: Cardiovascular;  Laterality: N/A;   TOTAL KNEE ARTHROPLASTY Right 04/16/2013   Procedure: RIGHT TOTAL KNEE ARTHROPLASTY;  Surgeon: Shelda Pal, MD;  Location: [facility] ORS;  Service: Orthopedics;  Laterality: Right;   TOTAL KNEE ARTHROPLASTY Left 09/15/2014   Procedure: LEFT TOTAL KNEE ARTHROPLASTY;  Surgeon: Shelda Pal, MD;  Location: [facility] ORS;  Service: Orthopedics;  Laterality: Left;   TUBAL LIGATION  1979   UPPER GI ENDOSCOPY      Allergies  Allergies  Allergen Reactions  Penicillins Anaphylaxis    anaphylaxis  Has patient had a PCN reaction causing immediate rash, facial/tongue/throat swelling, SOB or lightheadedness with hypotension: Yes Has patient had a PCN reaction causing severe rash involving mucus membranes or skin necrosis: No Has patient had a PCN reaction that required hospitalization: No Has patient had a PCN reaction occurring within the last 10 years: No If all of the above answers are "NO", then may proceed with Cephalosporin use.    Sulfa Antibiotics Anaphylaxis    Codeine Other (See Comments) and Nausea And Vomiting    Gi problems    Statins Other (See Comments)    Leg pains   Xarelto [Rivaroxaban] Swelling    pericarditis   Aspirin Other (See Comments)    "burned my stomach intensely" Abdominal pain and burning     Labs/Other Studies Reviewed    The following studies were reviewed today:  Cardiac Studies & Procedures   CARDIAC CATHETERIZATION  CARDIAC CATHETERIZATION 11/13/2017  Narrative Conclusions: 1. Successful ultrasound and fluoroscopic guided pericardiocentesis and pericardial drain placement (tube pericardiostomy) via the subxiphoid approach yielding 620 mL of blood fluid.  Recommendations: 1. Transfer to 2 Heart for pericardial drain management. 2. Obtain STAT CXR when patient arrives in ICU to confirm tube placement and exclude complications such as pneumothorax. 3. Keep drain to negative pressure using Doval device; record output every 4 hours. 4. Drain to be removed when less than 50 mL of output in 24 hours and no significant residual effusion on echo. 5. Hold all anticoagulants at this time.  Yvonne Kendall, MD [facility] Pager: [phone]     ECHOCARDIOGRAM  ECHOCARDIOGRAM COMPLETE 11/15/2022  Narrative ECHOCARDIOGRAM REPORT    Patient Name:   Selena Bush Date of Exam: 11/15/2022 Medical Rec #:  [id number]       Height:       62.0 in Accession #:    [id number]      Weight:       138.4 lb Date of Birth:  June 06, 1946       BSA:          1.635 m Patient Age:    77 years        BP:           96/58 mmHg Patient Gender: F               HR:           52 bpm. Exam Location:  Outpatient  Procedure: 2D Echo, Cardiac Doppler and Color Doppler  Indications:    stroke  History:        Patient has prior history of Echocardiogram examinations, most recent 04/29/2021. CHF, Aortic Valve Disease, Arrythmias:Atrial Fibrillation; Risk Factors:Hypertension and Dyslipidemia.  Sonographer:    Mike Gip Referring  Phys: [phone] Levert Feinstein  IMPRESSIONS   1. Left ventricular ejection fraction, by estimation, is 65 to 70%. Left ventricular ejection fraction by 2D MOD biplane is 69.6 %. The left ventricle has normal function. The left ventricle has no regional wall motion abnormalities. There is mild left ventricular hypertrophy. Left ventricular diastolic parameters are consistent with Grade I diastolic dysfunction (impaired relaxation). 2. Right ventricular systolic function is low normal. The right ventricular size is mildly enlarged. There is mildly elevated pulmonary artery systolic pressure. The estimated right ventricular systolic pressure is 38.9 mmHg. 3. Left atrial size was moderately dilated. 4. Right atrial size was mildly dilated. 5. The mitral valve is abnormal. Trivial mitral valve regurgitation. Moderate mitral  annular calcification. 6. The non-coronary cusp exhibits decreased mobility. The aortic valve is tricuspid. There is mild calcification of the aortic valve. There is moderate thickening of the aortic valve. Aortic valve regurgitation is not visualized. No aortic stenosis is present. Aortic valve mean gradient measures 8.0 mmHg. 7. The inferior vena cava is normal in size with <50% respiratory variability, suggesting right atrial pressure of 8 mmHg.  Comparison(s): Changes from prior study are noted. 04/29/2021: LVEF 65-70%, grade 2 DD, severe LAE, fixed non-coronary cusp.  FINDINGS Left Ventricle: Left ventricular ejection fraction, by estimation, is 65 to 70%. Left ventricular ejection fraction by 2D MOD biplane is 69.6 %. The left ventricle has normal function. The left ventricle has no regional wall motion abnormalities. The left ventricular internal cavity size was normal in size. There is mild left ventricular hypertrophy. Left ventricular diastolic parameters are consistent with Grade I diastolic dysfunction (impaired relaxation). Indeterminate filling pressures.  Right Ventricle: The  right ventricular size is mildly enlarged. No increase in right ventricular wall thickness. Right ventricular systolic function is low normal. There is mildly elevated pulmonary artery systolic pressure. The tricuspid regurgitant velocity is 2.78 m/s, and with an assumed right atrial pressure of 8 mmHg, the estimated right ventricular systolic pressure is 38.9 mmHg.  Left Atrium: Left atrial size was moderately dilated.  Right Atrium: Right atrial size was mildly dilated.  Pericardium: There is no evidence of pericardial effusion.  Mitral Valve: The mitral valve is abnormal. Moderate mitral annular calcification. Trivial mitral valve regurgitation.  Tricuspid Valve: The tricuspid valve is grossly normal. Tricuspid valve regurgitation is mild.  Aortic Valve: The non-coronary cusp exhibits decreased mobility. The aortic valve is tricuspid. There is mild calcification of the aortic valve. There is moderate thickening of the aortic valve. Aortic valve regurgitation is not visualized. No aortic stenosis is present. Aortic valve mean gradient measures 8.0 mmHg.  Pulmonic Valve: The pulmonic valve was grossly normal. Pulmonic valve regurgitation is trivial.  Aorta: The aortic root and ascending aorta are structurally normal, with no evidence of dilitation.  Venous: The inferior vena cava is normal in size with less than 50% respiratory variability, suggesting right atrial pressure of 8 mmHg.  IAS/Shunts: No atrial level shunt detected by color flow Doppler.   LEFT VENTRICLE PLAX 2D                        Biplane EF (MOD) LVIDd:         4.00 cm         LV Biplane EF:   Left LVIDs:         2.10 cm                          ventricular LV PW:         1.10 cm                          ejection LV IVS:        1.30 cm                          fraction by LVOT diam:     2.00 cm                          2D MOD LV SV:  101                              biplane is LV SV Index:   62                                69.6 %. LVOT Area:     3.14 cm Diastology LV e' medial:    3.92 cm/s LV Volumes (MOD)               LV E/e' medial:  18.0 LV vol d, MOD    72.7 ml       LV e' lateral:   7.40 cm/s A2C:                           LV E/e' lateral: 9.6 LV vol d, MOD    77.9 ml A4C: LV vol s, MOD    23.9 ml A2C: LV vol s, MOD    21.9 ml A4C: LV SV MOD A2C:   48.8 ml LV SV MOD A4C:   77.9 ml LV SV MOD BP:    52.5 ml  RIGHT VENTRICLE             IVC RV Basal diam:  4.00 cm     IVC diam: 2.00 cm RV S prime:     10.00 cm/s TAPSE (M-mode): 1.9 cm  LEFT ATRIUM             Index        RIGHT ATRIUM           Index LA diam:        4.20 cm 2.57 cm/m   RA Area:     11.50 cm LA Vol (A2C):   73.3 ml 44.84 ml/m  RA Volume:   22.50 ml  13.76 ml/m LA Vol (A4C):   60.4 ml 36.95 ml/m LA Biplane Vol: 69.6 ml 42.57 ml/m AORTIC VALVE AV Mean Grad: 8.0 mmHg LVOT Vmax:    128.00 cm/s LVOT Vmean:   84.800 cm/s LVOT VTI:     0.321 m  AORTA Ao Root diam: 3.50 cm Ao Asc diam:  3.70 cm  MITRAL VALVE               TRICUSPID VALVE MV Area (PHT): 3.16 cm    TR Peak grad:   30.9 mmHg MV Decel Time: 240 msec    TR Vmax:        278.00 cm/s MV E velocity: 70.70 cm/s MV A velocity: 68.10 cm/s  SHUNTS MV E/A ratio:  1.04        Systemic VTI:  0.32 m Systemic Diam: 2.00 cm  Zoila Shutter MD Electronically signed by Zoila Shutter MD Signature Date/Time: 11/15/2022/4:17:35 PM    Final            Recent Labs: 06/05/2022: BUN 20; Creatinine, Ser 0.98; Hemoglobin 13.6; Platelets 215; Potassium 3.7; Sodium 137  Recent Lipid Panel    Component Value Date/Time   CHOL 165 12/31/2020 0933   TRIG 109 12/31/2020 0933   HDL 62 12/31/2020 0933   CHOLHDL 2.7 12/31/2020 0933   CHOLHDL 4 06/01/2020 1111   VLDL 32.2 06/01/2020 1111   LDLCALC 84 12/31/2020 0933    History of Present Illness    77 year old female with the above past medical history including chronic diastolic  heart failure, paroxysmal  atrial fibrillation not on anticoagulation given history of hemorrhagic pericardial effusion, aortic stenosis, hypertension, hyperlipidemia, carotid artery stenosis, and Parkinsonism.  She was hospitalized in March 2019 in the setting of atypical chest pain, PAF.  Unfortunately, she returned to the hospital with significant dyspnea.  Follow-up echocardiogram showed large pericardial effusion with tamponade physiology.  She underwent pericardiocentesis in April 2019.  Cytology was negative.  It was felt that her initial presentation was likely pericarditis, and initiation of anticoagulation resulted in hemorrhagic pericardial effusion.  Anticoagulation was discontinued.  Echocardiogram in 09/2020 showed normal LV function, mild LVH, G2 DD, moderate pulmonary hypertension, severe left atrial enlargement, cannot rule out PFO, mild aortic stenosis, mean gradient 10 mmHg.  She has a history of parkinsonism and frequent falls.  She was last seen in office on 07/26/2022 and was stable overall from a cardiac standpoint.  She was maintaining sinus rhythm.  Most recent echocardiogram in 11/2022 showed EF 65 to 70%, normal LV function, no RWMA, mild LVH, G1 DD, normal RV systolic function, no evidence of pericardial effusion, moderate MAC, mild calcification of the aortic valve, no evidence of aortic stenosis.  Carotid Dopplers per neurology revealed 1 to 39% B ICA stenosis.  She saw her PCP on 01/03/2023 following an episode of chest pain.  Patient felt her symptoms were related to anxiety. Per notes, troponin was mildly elevated.  She presents today for follow-up accompanied by her daughter.  Since her last visit she has been stable from a cardiac standpoint.  She notes that her episode of chest discomfort occurred while she was lying in her recliner, preparing to go to sleep.  She described a burning sensation in her chest that lasted approximately 15 to 20 minutes before she called EMS.  Her symptoms resolved.  Of note,  she had COVID a week prior to this.  She has stable nonpitting dependent bilateral lower extremity edema.  She denies any recurrent symptoms of chest pain, denies palpitations, dyspnea, PND, orthopnea, weight gain.  Overall, she reports feeling well.  Home Medications    Current Outpatient Medications  Medication Sig Dispense Refill   acetaminophen (TYLENOL) 500 MG tablet Take 500 mg by mouth every 6 (six) hours as needed for moderate pain.     bisacodyl (DULCOLAX) 5 MG EC tablet Take 5 mg by mouth daily as needed for moderate constipation.     Carbidopa-Levodopa ER (RYTARY) 36.25-145 MG CPCR Take 3 tablets by mouth 3 (three) times daily. (Patient taking differently: Take 3 tablets by mouth 3 (three) times daily. 7 am 12 pm 5 pm) 270 capsule 11   Carbidopa-Levodopa ER (SINEMET CR) 25-100 MG tablet controlled release Take 1 tablet by mouth at bedtime. 30 tablet 11   Cholecalciferol (VITAMIN D-3) 25 MCG (1000 UT) CAPS Take 1 capsule by mouth daily.     Cyanocobalamin (VITAMIN B-12) 5000 MCG SUBL Place 1 each under the tongue daily.     DULoxetine (CYMBALTA) 60 MG capsule TAKE ONE CAPSULE BY MOUTH TWICE DAILY FOR anxiety AND depression (Patient taking differently: Take 60 mg by mouth 2 (two) times daily. TAKE ONE CAPSULE BY MOUTH TWICE DAILY FOR anxiety AND depression) 180 capsule 0   ezetimibe (ZETIA) 10 MG tablet TAKE ONE TABLET BY MOUTH EVERY MORNING (Patient taking differently: Take 10 mg by mouth daily.) 90 tablet 3   fluticasone (FLONASE) 50 MCG/ACT nasal spray Place 2 sprays into both nostrils daily as needed for rhinitis or allergies. 48 g 3  furosemide (LASIX) 20 MG tablet TAKE ONE TABLET BY MOUTH ONCE DAILY May take additional tablet daily if needed (Patient taking differently: Take 40 mg by mouth 2 (two) times daily.) 180 tablet 3   gabapentin (NEURONTIN) 600 MG tablet Take 600 mg by mouth as needed.     JARDIANCE 10 MG TABS tablet TAKE ONE TABLET BY MOUTH BEFORE BREAKFAST (Patient taking  differently: Take 10 mg by mouth daily.) 30 tablet 5   Melatonin 5 MG CHEW Chew 10 mg by mouth at bedtime.     metoprolol tartrate (LOPRESSOR) 25 MG tablet TAKE THREE TABLETS BY MOUTH TWICE DAILY (Patient taking differently: Take 50 mg by mouth 2 (two) times daily.) 540 tablet 1   Multiple Vitamins-Minerals (CEROVITE SENIOR PO) Take 1 tablet by mouth daily.     oxybutynin (DITROPAN) 5 MG tablet Take 5 mg by mouth 2 (two) times daily.     sotalol (BETAPACE) 120 MG tablet TAKE ONE TABLET BY MOUTH TWICE DAILY (Patient taking differently: Take 120 mg by mouth 2 (two) times daily.) 180 tablet 3   spironolactone (ALDACTONE) 25 MG tablet Take 1 tablet (25 mg total) by mouth daily. 90 tablet 1   tiZANidine (ZANAFLEX) 4 MG tablet TAKE 2 TABLET (4 MG TOTAL) BY MOUTH DAILY FOR MUSCLE SPASMS AT BEDTIME. (Patient taking differently: Take 4 mg by mouth in the morning and at bedtime.) 180 tablet 3   No current facility-administered medications for this visit.     Review of Systems   She denies chest pain, palpitations, dyspnea, pnd, orthopnea, n, v, dizziness, syncope, weight gain, or early satiety. All other systems reviewed and are otherwise negative except as noted above.   Physical Exam    VS:  BP (!) 100/90   Pulse (!) 53   Ht 5\' 5"  (1.651 m)   Wt 140 lb (63.5 kg)   SpO2 90%   BMI 23.30 kg/m .  GEN: Well nourished, well developed, in no acute distress. HEENT: normal. Neck: Supple, no JVD, carotid bruits, or masses. Cardiac: RRR, 2/6 murmurs, no rubs, or gallops. No clubbing, cyanosis, edema.  Radials/DP/PT 2+ and equal bilaterally.  Respiratory:  Respirations regular and unlabored, fine crackles to bases bilaterally. GI: Soft, nontender, nondistended, BS + x 4. MS: no deformity or atrophy. Skin: warm and dry, no rash. Neuro:  Strength and sensation are intact. Psych: Normal affect.  Accessory Clinical Findings    ECG personally reviewed by me today -sinus bradycardia, 53 bpm- no acute  changes.   Lab Results  Component Value Date   WBC 8.9 06/05/2022   HGB 13.6 06/05/2022   HCT 42.6 06/05/2022   MCV 99.8 06/05/2022   PLT 215 06/05/2022   Lab Results  Component Value Date   CREATININE 0.98 06/05/2022   BUN 20 06/05/2022   NA 137 06/05/2022   K 3.7 06/05/2022   CL 104 06/05/2022   CO2 26 06/05/2022   Lab Results  Component Value Date   ALT 5 05/21/2021   AST 12 05/21/2021   ALKPHOS 107 05/21/2021   BILITOT 0.3 05/21/2021   Lab Results  Component Value Date   CHOL 165 12/31/2020   HDL 62 12/31/2020   LDLCALC 84 12/31/2020   TRIG 109 12/31/2020   CHOLHDL 2.7 12/31/2020    Lab Results  Component Value Date   HGBA1C 5.8 (A) 06/22/2021    Assessment & Plan    1. Chest pain: Recent episode of chest discomfort that occurred at rest, described as a  burning sensation, lasted 15 to 20 minutes.  Troponin was mildly elevated.  She did have COVID one week prior to episode of chest pain.  She denies any recurrent chest pain.  We discussed possible repeat limited echo given history of pericardial effusion and recent COVID infection, possible ischemic evaluation.  Patient declines any additional testing at this time. We discussed ongoing monitoring of symptoms, ED precautions.   2. Chronic diastolic heart failure: Most recent echocardiogram in 11/2022 showed EF 65 to 70%, normal LV function, no RWMA, mild LVH, G1 DD, normal RV systolic function, no evidence of pericardial effusion, moderate MAC, mild calcification of the aortic valve, no evidence of aortic stenosis.  He has stable dependent nonpitting bilateral lower extremity edema, fine crackles to bilateral lung bases, otherwise, generally euvolemic and well compensated on exam.  Continue metoprolol, Jardiance, spironolactone, and prn Lasix.   3. Paroxysmal atrial fibrillation: No longer on anticoagulation due to history of hemorrhagic pericardial effusion, frequent falls.  Maintaining sinus rhythm.  Continue sotalol,  metoprolol.  4. History of hemorrhagic pericardial effusion: Resolved.  5. Aortic stenosis: Mild on echo in 2022.  No evidence of aortic stenosis on most recent echo on 11/2022.  6. Hypertension: BP well controlled, borderline low.  Continue to monitor with recent hypotension.  For now, continue current antihypertensive regimen.   7. Hyperlipidemia: No recent LDL on file.  Statin intolerant.  Continue Zetia.  8. Carotid artery stenosis: Carotid ultrasound in 11/2022 revealed 1 to 39% B ICA stenosis.  Asymptomatic.  Continue Zetia.  9. Parkinsonism: Follows with neurology.   10. Disposition: Follow-up in 3-4 months with APP, 6 months with [facility].   Joylene Grapes, NP 01/12/2023, 2:36 PM

## 2023-01-13 ENCOUNTER — Ambulatory Visit
Admission: RE | Admit: 2023-01-13 | Discharge: 2023-01-13 | Disposition: A | Discharge status: Home / Self Care | Payer: Medicare (Managed Care) | Source: Ambulatory Visit | Attending: Neurology | Admitting: Neurology

## 2023-01-13 DIAGNOSIS — R29898 Other symptoms and signs involving the musculoskeletal system: Secondary | ICD-10-CM

## 2023-01-16 ENCOUNTER — Ambulatory Visit: Payer: Medicare HMO

## 2023-01-19 ENCOUNTER — Ambulatory Visit (INDEPENDENT_AMBULATORY_CARE_PROVIDER_SITE_OTHER): Payer: Medicare (Managed Care) | Admitting: Podiatry

## 2023-01-19 DIAGNOSIS — L600 Ingrowing nail: Secondary | ICD-10-CM

## 2023-01-19 NOTE — Progress Notes (Signed)
Subjective:  Patient ID: Selena Bush, female    DOB: 13-Feb-1946,  MRN: [id number]  Chief Complaint  Patient presents with   Ingrown Toenail    Rm 17 bilateral hallux ingrown pain off and on for 1 years. Left over right. Soreness and redness. NO drainage or bleeding.   *Sulfa allergy    77 y.o. female presents with concern for ingrown nail on the left hallux medial border.  She says she has had pain on and off of over a year.  She says that the left medial border is the primary area of concern with pain on pressure on the area and notes that the nail is curving on the side.  Does not have any irritation on the right hallux nail at this time.  Past Medical History:  Diagnosis Date   Anxiety    Atrial fibrillation (HCC)    Cat bite of right hand 03/25/2019   Chronic diastolic CHF (congestive heart failure) (HCC)    a. echo 09/2014: EF 60-65%, diastolic dysfunction, mild LVH, nl RV size & systolic function, mildly dilated LA (4.3 cm), mild MR/TR, mildly elevated PASP 36.7 mm Hg   DDD (degenerative disc disease), cervical    DDD (degenerative disc disease), lumbar    Depression    Diffuse cystic mastopathy 2014   Dyspnea    Dysrhythmia    GERD (gastroesophageal reflux disease)    Gout    Headache    rare   HLD (hyperlipidemia)    a. statin intolerant 2/2 myalgias   Hx of dysplastic nevus 12/25/2018   L medial ankle   Hypercholesterolemia    Hypertension    Malignant neoplasm of upper-outer quadrant of female breast (HCC) 10/2012   Papillary DCIS, sentinel node negative. DR/PR positive. PARTIAL RIGHT MASTECTOMY FOR BREAST CANCER--HAD RADIATION - NO CHEMO --DR. [facility] ONCOLOGIST   Obesity    OSA on CPAP    Osteoarthritis of both knees    a. s/p right TKA 04/2013 & left TKA 09/2014   Otitis externa    Parkinson disease    Personal history of radiation therapy 2015   RIGHT breast-mammosite per pt   Pneumonia of both lungs due to infectious organism 01/08/2021   Sleep  difficulties    LUNESTA HAS HELPED   Vaginal cyst     Allergies  Allergen Reactions   Penicillins Anaphylaxis    anaphylaxis  Has patient had a PCN reaction causing immediate rash, facial/tongue/throat swelling, SOB or lightheadedness with hypotension: Yes Has patient had a PCN reaction causing severe rash involving mucus membranes or skin necrosis: No Has patient had a PCN reaction that required hospitalization: No Has patient had a PCN reaction occurring within the last 10 years: No If all of the above answers are "NO", then may proceed with Cephalosporin use.    Sulfa Antibiotics Anaphylaxis   Codeine Other (See Comments) and Nausea And Vomiting    Gi problems    Statins Other (See Comments)    Leg pains   Xarelto [Rivaroxaban] Swelling    pericarditis   Aspirin Other (See Comments)    "burned my stomach intensely" Abdominal pain and burning    ROS: Negative except as per HPI above  Objective:  General: AAO x3, NAD  Dermatological: Incurvation is present along the medial nail border of the left great toe. There is localized edema without any erythema or increase in warmth around the nail border. There is no drainage or pus. There is no ascending cellulitis. No  malodor. No open lesions or pre-ulcerative lesions.    Vascular:  Dorsalis Pedis artery and Posterior Tibial artery pedal pulses are 2/4 bilateral.  Capillary fill time < 3 sec to all digits.   Neruologic: Grossly intact via light touch bilateral. Protective threshold intact to all sites bilateral.   Musculoskeletal: No gross boney pedal deformities bilateral. No pain, crepitus, or limitation noted with foot and ankle range of motion bilateral. Muscular strength 5/5 in all groups tested bilateral.  Gait: Unassisted, Nonantalgic.   No images are attached to the encounter.   Assessment:   1. Ingrown nail of great toe of left foot      Plan:  Patient was evaluated and treated and all questions  answered.    Ingrown Nail, left -Patient elects to proceed with minor surgery to remove ingrown toenail today. Consent reviewed and signed by patient. -Ingrown nail excised. See procedure note. -Educated on post-procedure care including soaking. Written instructions provided and reviewed. -Patient to follow up in 2 weeks for nail check.  Procedure: Excision of Ingrown Toenail Location: Left 1st toe medial nail borders. Anesthesia: Lidocaine 1% plain; 1.5 mL and Marcaine 0.5% plain; 1.5 mL, digital block. Skin Prep: Betadine. Dressing: Silvadene; telfa; dry, sterile, compression dressing. Technique: Following skin prep, the toe was exsanguinated and a tourniquet was secured at the base of the toe. The affected nail border was freed, split with a nail splitter, and excised. Chemical matrixectomy was then performed with phenol and irrigated out with alcohol. The tourniquet was then removed and sterile dressing applied. Disposition: Patient tolerated procedure well. Patient to return in 2 weeks for follow-up.    Return in about 2 weeks (around 02/02/2023) for nail check.          Corinna Gab, DPM [facility] / [facility]

## 2023-01-19 NOTE — Patient Instructions (Signed)

## 2023-01-24 ENCOUNTER — Telehealth: Payer: Self-pay | Admitting: Neurology

## 2023-01-24 ENCOUNTER — Telehealth: Payer: Self-pay

## 2023-01-24 NOTE — Telephone Encounter (Signed)
Noted Dr. Rosalyn Gess response and agree.  Noted that MRI was done 5/31 and not resulted until 01/24/23.  Also noted that it stated in radiology dictation that we would be called with results and that was not done, to my knowledge.  Dana/Danna, can we try to get her in for follow up on 6/20 at 2:30 (I am not back in office until 6/17).  We can do video visit if she prefers (daughter lives in [place] and pt is in [place]).

## 2023-01-24 NOTE — Telephone Encounter (Signed)
Spoke with patient and her caregiver Rivka Barbara [phone]. Discussed brain MRI showing small suspected 5mm early subacute infarct in the right precentral gyrus, new small chronic cortical infarcts in the left frontal and parietal lobes (new from 08/2022 imaging), severe chronic microvascular disease. MRI was done due to new right leg weakness which does not correlate with current MRI findings. Right leg weakness is stable, they deny any weakness on left side. She is at [facility] and does physical therapy working on right leg. Notes reviewed, she has paroxysmal afib but had hemorrhagic pericardial effusion from anticoagulation so she has been off anticoagulation. Dr. Jens Som has been contacted re: options for secondary stroke prevention. Will update patient/caregiver. Discussed that for any sudden change in symptoms, go to ER immediately.

## 2023-01-24 NOTE — Telephone Encounter (Signed)
[facility] called with the following results for this patients MRI Brain  IMPRESSION: 1. Suspected 5 mm early subacute infarct in the right precentral gyrus. 2. Severe chronic small vessel ischemic disease. New small chronic cortical infarcts in the left frontal and parietal lobes. 3. Progressive sinusitis.

## 2023-01-25 NOTE — Telephone Encounter (Signed)
Lmom to see if we can get patient appt to see Dr Tat  will try back later

## 2023-01-25 NOTE — Telephone Encounter (Signed)
Called [name] back she is on DPR but we need to schedule an appointment for this patient and we do not perform physicals in our office ?

## 2023-01-25 NOTE — Telephone Encounter (Signed)
Pt is sch for 02-02-23

## 2023-01-25 NOTE — Telephone Encounter (Signed)
Selena Bush called and left a message with the access nurse. She is returning a call regarding the pt's MRI and the pt needs a physical.

## 2023-01-26 ENCOUNTER — Other Ambulatory Visit: Payer: Medicare (Managed Care)

## 2023-01-31 NOTE — Progress Notes (Deleted)
Assessment/Plan:   1.  Parkinsons disease, diagnosed 2021  -I do not think that the patient has an atypical state.  She does not describe the red flags, with the exception of diplopia and that did not develop for several years (3) into the disease.  I do not think the neck flexion is associated with Parkinson's, although it could be.  I think this is more orthopedic in nature.  She has apparently had that for many years.  -Patient is currently on Rytary 145 mg, 3 capsules 3 times per day.  -Patient is currently on carbidopa/levodopa 25/100 CR at bedtime.  Ultimately, that 1 can probably be changed to Rytary, but we were addressing other things today.  2.  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  Imaging since that time confirms that she has had multiple strokes, suggestive of embolic etiology.  Unfortunately, her history is such that she has had a hemorrhagic pericardial effusion after initiation of anticoagulation.  Her cardiologist felt she was no longer a candidate for anticoagulation.  It is unclear if she is a candidate for Watchman device, but I emailed Dr. Jens Som about that.  If not, I did ask him if she would be eligible for dual antiplatelet therapy. Dr. Jens Som emailed me back and stated he would make her an appt to discuss with her personally  -will recheck echo with bubble  -carotid u/s  3.  Low bP, likely neurogenic orthostatic hypotension  -Certainly worsened by metoprolol.   -Discussed with them the concept of permissive hypertension in Parkinson's disease. -Will be especially important to get this under better control if she is able to get up and ambulate again.  4.  Known a-fib  -source of #2   Subjective:   Selena Bush was seen today to discuss testing results.  When I saw her last visit, it was clear that she had Parkinsons disease, but she had developed acute right leg weakness in February,  2024.  We ended up ordering an MRI of the brain.  This was done on May 31, but unfortunately it was not read until June 11.  There were chronic infarcts in the left frontal lobe and left parietal lobe, but they were new compared to her prior MRI in January, 2024.  In addition, she had acute/early subacute in the right precentral gyrus.  [facility] is doing with [facility].      ALLERGIES:   Allergies  Allergen Reactions   Penicillins Anaphylaxis    anaphylaxis  Has patient had a PCN reaction causing immediate rash, facial/tongue/throat swelling, SOB or lightheadedness with hypotension: Yes Has patient had a PCN reaction causing severe rash involving mucus membranes or skin necrosis: No Has patient had a PCN reaction that required hospitalization: No Has patient had a PCN reaction occurring within the last 10 years: No If all of the above answers are "NO", then may proceed with Cephalosporin use.    Sulfa Antibiotics Anaphylaxis   Codeine Other (See Comments) and Nausea And Vomiting    Gi problems    Statins Other (See Comments)    Leg pains   Xarelto [Rivaroxaban] Swelling    pericarditis   Aspirin Other (See Comments)    "burned my stomach intensely" Abdominal pain and burning    CURRENT MEDICATIONS:  No outpatient medications have been marked as taking for the 02/02/23 encounter (Appointment) with Jjesus Dingley, Octaviano Batty, DO.     Objective:  VITALS:   There were no vitals filed for this visit.   GEN:  The patient appears stated age and is in NAD. HEENT:  Normocephalic, atraumatic.  The mucous membranes are moist. The superficial temporal arteries are without ropiness or tenderness. CV:  RRR Lungs:  CTAB Neck/HEME:  There are no carotid bruits bilaterally.  Claudie Fisherman is on the chest.  Neurological examination:  Orientation: The patient is alert and oriented x3.  Cranial nerves: There is good facial symmetry. Extraocular muscles are intact. The visual fields are full to confrontational testing.  The speech is fluent and clear. Soft palate rises symmetrically and there is no tongue deviation. Hearing is intact to conversational tone. Sensation: Sensation is intact to light and pinprick throughout (facial, trunk, extremities). Vibration is intact at the bilateral big toe. There is no extinction with double simultaneous stimulation. There is no sensory dermatomal level identified. Motor: Strength is 5/5 in the bilateral upper and lower extremities.  While she had trouble with hip flexion on the right initially, when we did manual motor testing, strength was 5/5.  Shoulder shrug is equal and symmetric.  There is no pronator drift.  She does list to the right within her wheelchair Deep tendon reflexes: Deep tendon reflexes are 0-1/4 at the bilateral biceps, triceps, brachioradialis, patella and achilles. Plantar responses are downgoing bilaterally.  Movement examination: Tone: There is normal tone in the bilateral upper extremities.  The tone in the lower extremities is normal.  Abnormal movements: None seen today Coordination:  There is no real decremation with rapid alternating movements, but finger taps are slow bilaterally, right worse than left Gait and Station: Unable per patient and daughter I have reviewed and interpreted the following labs independently   Chemistry      Component Value Date/Time   NA 137 06/05/2022 0203   NA 142 05/21/2021 1505   NA 136 09/22/2014 0518   K 3.7 06/05/2022 0203   K 3.6 09/22/2014 0518   CL 104 06/05/2022 0203   CL 97 (L) 09/22/2014 0518   CO2 26 06/05/2022 0203   CO2 29 09/22/2014 0518   BUN 20 06/05/2022 0203   BUN 20 05/21/2021 1505   BUN 13 09/22/2014 0518   CREATININE 0.98 06/05/2022 0203   CREATININE 1.19 (H) 06/07/2016 1450      Component Value Date/Time   CALCIUM 9.6 06/05/2022 0203   CALCIUM 8.4 (L) 09/22/2014 0518   ALKPHOS 107 05/21/2021 1505   AST 12 05/21/2021 1505   ALT 5 05/21/2021 1505   BILITOT 0.3 05/21/2021 1505       Lab Results  Component Value Date   TSH 1.780 05/21/2021   Lab Results  Component Value Date   WBC 8.9 06/05/2022   HGB 13.6 06/05/2022   HCT 42.6 06/05/2022   MCV 99.8 06/05/2022   PLT 215 06/05/2022     Total time spent on today's visit was *** minutes, including both face-to-face time and nonface-to-face time.  Time included that spent on review of records (prior notes available to me/labs/imaging if pertinent), discussing treatment and goals, answering patient's questions and coordinating care.  Cc:  Pcp, No

## 2023-02-02 ENCOUNTER — Ambulatory Visit: Payer: Medicare (Managed Care) | Admitting: Neurology

## 2023-02-02 ENCOUNTER — Ambulatory Visit: Payer: Medicare (Managed Care) | Admitting: Podiatry

## 2023-02-02 ENCOUNTER — Encounter: Payer: Self-pay | Admitting: Neurology

## 2023-02-03 ENCOUNTER — Ambulatory Visit: Payer: Medicare (Managed Care) | Admitting: Podiatry

## 2023-02-06 ENCOUNTER — Telehealth: Payer: Self-pay | Admitting: Neurology

## 2023-02-06 NOTE — Telephone Encounter (Signed)
Patient asked daughter to call us about taking a Bayer aspirin every morning. She states that someone told her mom that would help her from not having a stroke. So they just want to make sure it was ok

## 2023-02-06 NOTE — Telephone Encounter (Signed)
Patients daughter is going to make the July 02nd appointment work with the PA at [facility]

## 2023-02-06 NOTE — Progress Notes (Signed)
Assessment/Plan:   1.  Parkinsons disease, diagnosed 2021  -I do not think that the patient has an atypical state.  She does not describe the red flags, with the exception of diplopia and that did not develop for several years (3) into the disease.  I do not think the neck flexion is associated with Parkinson's, although it could be.  I think this is more orthopedic in nature.  She has apparently had that for many years.  -Patient is currently on Rytary 145 mg, 3 capsules 3 times per day.  -Patient is currently on carbidopa/levodopa 25/100 CR at bedtime.  Ultimately, that 1 can probably be changed to Rytary, but we were addressing other things today.  2.  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  Imaging since that time confirms that she has had multiple strokes, suggestive of embolic etiology.  Unfortunately, her history is such that she has had a hemorrhagic pericardial effusion after initiation of anticoagulation.  Her cardiologist felt she was no longer a candidate for anticoagulation.  It is unclear if she is a candidate for Watchman device, but I emailed Dr. Jens Som about that.  If not, I did ask him if she would be eligible for dual (or single) antiplatelet therapy. Dr. Jens Som emailed me back and stated he would make her an appt to discuss with her personally.  That was about a week and a half ago and an appt had not been made so I had my staff make an appt for her.  Her daughter is understandably eager to understand options.  Patient has an appointment July 2.  I was going to go ahead and proceed with carotid ultrasound, but her daughter pointed out to me that patient just had 1 done November 15, 2022 and she had 1 to 39% stenosis.  She also had an echocardiogram done that same day.  This was done without bubble study.  Initially, I had planned to do it with bubble, but I did not go ahead and repeat that today.  I will wait  and see what Dr. Jens Som says.  He may go ahead and do Zio patch as well.  Nonetheless, we will wait and see his advice next week.  -Talked extensively to the patient about stroke signs and symptoms and the importance of getting to the hospital should she have any of those.   3.  Low bP, likely neurogenic orthostatic hypotension  -Certainly worsened by metoprolol.   -Discussed with them the concept of permissive hypertension in Parkinson's disease. -Will be especially important to get this under better control if she is able to get up and ambulate again.  4.  Known history of atrial fibrillation  -Likely source of #2   Subjective:   Selena Bush was seen today to discuss testing results.  When I saw her last visit, it was clear that she had Parkinsons disease, but she had developed acute right leg weakness in February, 2024.  We ended up ordering an MRI of the brain.  This was done on May 31, but unfortunately it was not read until June 11.  There were chronic infarcts in the left frontal lobe and left parietal lobe, but they were new compared to her prior MRI in January, 2024.  In addition, she had acute/early subacute in the right precentral gyrus.  [facility] is [facility] with [facility].   ALLERGIES:   Allergies  Allergen Reactions  Penicillins Anaphylaxis    anaphylaxis  Has patient had a PCN reaction causing immediate rash, facial/tongue/throat swelling, SOB or lightheadedness with hypotension: Yes Has patient had a PCN reaction causing severe rash involving mucus membranes or skin necrosis: No Has patient had a PCN reaction that required hospitalization: No Has patient had a PCN reaction occurring within the last 10 years: No If all of the above answers are "NO", then may proceed with Cephalosporin use.    Sulfa Antibiotics Anaphylaxis   Codeine Other (See Comments) and Nausea And Vomiting    Gi problems    Statins Other (See Comments)    Leg pains   Xarelto [Rivaroxaban] Swelling     pericarditis   Aspirin Other (See Comments)    "burned my stomach intensely" Abdominal pain and burning    CURRENT MEDICATIONS:  No outpatient medications have been marked as taking for the 02/07/23 encounter (Office Visit) with Merida Alcantar, Octaviano Batty, DO.     Objective:   VITALS:   Vitals:   02/07/23 1407  BP: 124/88  Pulse: 67  SpO2: 98%  Weight: 144 lb (65.3 kg)  Height: 5\' 5"  (1.651 m)     GEN:  The patient appears stated age and is in NAD. HEENT:  Normocephalic, atraumatic.  The mucous membranes are moist. The superficial temporal arteries are without ropiness or tenderness. CV:  RRR Lungs:  CTAB Neck/HEME:  There are no carotid bruits bilaterally.  Claudie Fisherman is on the chest. Musculoskeletal: Patient is leaning to the right.  Neurological examination:  Orientation: The patient is alert and oriented x3.  Cranial nerves: There is good facial symmetry. Extraocular muscles are intact. the speech is fluent and clear. Hearing is intact to conversational tone. Sensation: Sensation is intact to light touch throughout. Motor: Strength is 5/5 in the biceps.  She had mild trouble with deltoid strength on the right.  .  While she had trouble with hip flexion on the right initially, when we did manual motor testing, strength was 4+/5.  Shoulder shrug is equal and symmetric.  There is no pronator drift.  She does list to the right within her wheelchair Deep tendon reflexes: Deep tendon reflexes are 0-1/4 at the bilateral biceps, triceps, brachioradialis, patella and achilles. Plantar responses are downgoing bilaterally.  Movement examination: Tone: There is mild spasticity in the right leg.  Tone elsewhere is normal. Abnormal movements: None seen today Coordination:  There is no real decremation with rapid alternating movements, but finger taps are slow bilaterally, right worse than left Gait and Station: Unable per patient and daughter I have reviewed and interpreted the following labs  independently   Chemistry      Component Value Date/Time   NA 137 06/05/2022 0203   NA 142 05/21/2021 1505   NA 136 09/22/2014 0518   K 3.7 06/05/2022 0203   K 3.6 09/22/2014 0518   CL 104 06/05/2022 0203   CL 97 (L) 09/22/2014 0518   CO2 26 06/05/2022 0203   CO2 29 09/22/2014 0518   BUN 20 06/05/2022 0203   BUN 20 05/21/2021 1505   BUN 13 09/22/2014 0518   CREATININE 0.98 06/05/2022 0203   CREATININE 1.19 (H) 06/07/2016 1450      Component Value Date/Time   CALCIUM 9.6 06/05/2022 0203   CALCIUM 8.4 (L) 09/22/2014 0518   ALKPHOS 107 05/21/2021 1505   AST 12 05/21/2021 1505   ALT 5 05/21/2021 1505   BILITOT 0.3 05/21/2021 1505      Lab Results  Component Value Date   TSH 1.780 05/21/2021   Lab Results  Component Value Date   WBC 8.9 06/05/2022   HGB 13.6 06/05/2022   HCT 42.6 06/05/2022   MCV 99.8 06/05/2022   PLT 215 06/05/2022     Total time spent on today's visit was 44 minutes, including both face-to-face time and nonface-to-face time.  Time included that spent on review of records (prior notes available to me/labs/imaging if pertinent), discussing treatment and goals, answering patient's questions and coordinating care.  Cc:  Jethro Bastos, MD

## 2023-02-07 ENCOUNTER — Ambulatory Visit (INDEPENDENT_AMBULATORY_CARE_PROVIDER_SITE_OTHER): Payer: Medicare (Managed Care) | Admitting: Neurology

## 2023-02-07 ENCOUNTER — Encounter: Payer: Self-pay | Admitting: Neurology

## 2023-02-07 VITALS — BP 124/88 | HR 67 | Ht 65.0 in | Wt 144.0 lb

## 2023-02-07 DIAGNOSIS — G20A1 Parkinson's disease without dyskinesia, without mention of fluctuations: Secondary | ICD-10-CM

## 2023-02-07 DIAGNOSIS — I6310 Cerebral infarction due to embolism of unspecified precerebral artery: Secondary | ICD-10-CM

## 2023-02-14 ENCOUNTER — Telehealth (HOSPITAL_BASED_OUTPATIENT_CLINIC_OR_DEPARTMENT_OTHER): Payer: Self-pay | Admitting: Family

## 2023-02-14 ENCOUNTER — Ambulatory Visit (INDEPENDENT_AMBULATORY_CARE_PROVIDER_SITE_OTHER): Payer: Medicare (Managed Care) | Admitting: Family

## 2023-02-14 ENCOUNTER — Other Ambulatory Visit (INDEPENDENT_AMBULATORY_CARE_PROVIDER_SITE_OTHER): Payer: Medicare (Managed Care)

## 2023-02-14 ENCOUNTER — Encounter (HOSPITAL_BASED_OUTPATIENT_CLINIC_OR_DEPARTMENT_OTHER): Payer: Self-pay | Admitting: Family

## 2023-02-14 ENCOUNTER — Telehealth: Payer: Self-pay | Admitting: Licensed Clinical Social Worker

## 2023-02-14 VITALS — BP 98/76 | HR 57 | Ht 65.0 in

## 2023-02-14 DIAGNOSIS — Z8673 Personal history of transient ischemic attack (TIA), and cerebral infarction without residual deficits: Secondary | ICD-10-CM

## 2023-02-14 DIAGNOSIS — E785 Hyperlipidemia, unspecified: Secondary | ICD-10-CM

## 2023-02-14 DIAGNOSIS — I5032 Chronic diastolic (congestive) heart failure: Secondary | ICD-10-CM | POA: Diagnosis not present

## 2023-02-14 DIAGNOSIS — I480 Paroxysmal atrial fibrillation: Secondary | ICD-10-CM

## 2023-02-14 DIAGNOSIS — I350 Nonrheumatic aortic (valve) stenosis: Secondary | ICD-10-CM

## 2023-02-14 DIAGNOSIS — R5381 Other malaise: Secondary | ICD-10-CM

## 2023-02-14 DIAGNOSIS — I6523 Occlusion and stenosis of bilateral carotid arteries: Secondary | ICD-10-CM

## 2023-02-14 DIAGNOSIS — G20C Parkinsonism, unspecified: Secondary | ICD-10-CM | POA: Diagnosis not present

## 2023-02-14 MED ORDER — CLOPIDOGREL BISULFATE 75 MG PO TABS: 75.0000 mg | ORAL_TABLET | Freq: Every day | ORAL | 3 refills | Status: DC

## 2023-02-14 NOTE — Telephone Encounter (Signed)
Received return call from [facility]. Rx faxed appropriately. Appreciate LCSW assistance.   Alver Sorrow, NP

## 2023-02-14 NOTE — Patient Instructions (Addendum)
Medication Instructions:  Your physician has recommended you make the following change in your medication:   Start: Plavix 75mg  daily   This is an antiplatelet medication for stroke prevention.   We may consider a different medication such as Eliquis but we are discussing with Dr. Jens Som  If your BP is persistently low this may contribute to fatigue, we discussed possible addition of Midodrine to help raise BP. We will re-discuss at follow up. If you decide you want to start this medication prior to your next appointment, please call and let us know.   *If you need a refill on your cardiac medications before your next appointment, please call your pharmacy*  Testing/Procedures: Your physician has recommended that you wear a Zio monitor.   This monitor is a medical device that records the heart's electrical activity. Doctors most often use these monitors to diagnose arrhythmias. Arrhythmias are problems with the speed or rhythm of the heartbeat. The monitor is a small device applied to your chest. You can wear one while you do your normal daily activities. While wearing this monitor if you have any symptoms to push the button and record what you felt. Once you have worn this monitor for the period of time provider prescribed (Usually 14 days), you will return the monitor device in the postage paid box. Once it is returned they will download the data collected and provide [place] with a report which the provider will then review and we will call you with those results. Important tips:  Avoid showering during the first 24 hours of wearing the monitor. Avoid excessive sweating to help maximize wear time. Do not submerge the device, no hot tubs, and no swimming pools. Keep any lotions or oils away from the patch. After 24 hours you may shower with the patch on. Take brief showers with your back facing the shower head.  Do not remove patch once it has been placed because that will interrupt data and  decrease adhesive wear time. Push the button when you have any symptoms and write down what you were feeling. Once you have completed wearing your monitor, remove and place into box which has postage paid and place in your outgoing mailbox.  If for some reason you have misplaced your box then call our office and we can provide another box and/or mail it off for you   Follow-Up: At [facility], you and your health needs are our priority.  As part of our continuing mission to provide you with exceptional heart care, we have created designated Provider Care Teams.  These Care Teams include your primary Cardiologist (physician) and Advanced Practice Providers (APPs -  Physician Assistants and Nurse Practitioners) who all work together to provide you with the care you need, when you need it.  We recommend signing up for the patient portal called "MyChart".  Sign up information is provided on this After Visit Summary.  MyChart is used to connect with patients for Virtual Visits (Telemedicine).  Patients are able to view lab/test results, encounter notes, upcoming appointments, etc.  Non-urgent messages can be sent to your provider as well.   To learn more about what you can do with MyChart, go to [email or web address].    Your next appointment:   Follow up as scheduled pending results of monitor may schedule sooner follow up

## 2023-02-14 NOTE — Progress Notes (Signed)
Cardiology Office Note:  .   Date:  02/14/2023  ID:  Selena Bush, DOB [birth year], MRN [id number] PCP: Jethro Bastos, MD  [facility] Providers Cardiologist:  Olga Millers, MD    History of Present Illness: .   Selena Bush is a 77 y.o. female chronic diastolic heart failure, paroxysmal atrial fibrillation not on anticoagulation given prior hemorrhagic pericardial effusion, aortic stenosis, hypertension, hyperlipidemia, carotid artery stenosis, coronary atherosclerosis on CT, aortic acidosis, parkinsonism.  Hospitalized March 2019 due to atypical chest pain, PAF.  Echocardiogram showed large pericardial effusion with tamponade physiology.  Underwent pericardiocentesis 11/2017.  Cytology was negative.  It was felt initial presentation was likely pericarditis and initiation of anticoagulation unregulated and hemorrhagic pericardial effusion.  Anticoagulation discontinued.  Echocardiogram 09/2020 normal LVEF, mild LVH, gr2dd, moderate pulmonary hypertension, severe LAE, mild AS (mean gradient 10 mmHg).   Hospitalized 05/2022 after mechanical fall requiring stay at [facility].  Seen 07/26/22 in clinic maintaining NSR. Updated echo 11/2022 LVEF 65-70%, no RWMA, mild LVH, gr1DD, RVSF normal, no pericardial effusion, moderate MAC, mild calcification of AV without stenosis. Carotid doppler 11/2022 per neurology bilateral 1-39% ICA stenosis.   Last seen 01/12/23 noting chest pain episode while laying in recliner one week post COVID. She was offered repeat limited echo or possible ischemic evaluation but preferred to monitor symptoms.   Since that time she has been evaluated by neurology.  MRI 08/2022 and subsequently developed right leg weakness.  Imaging 01/13/23  since that time with multiple strokes (chronic infarcts in left frontal lobe and left parietal lobe as well as acute/early subacute in right precentral gyrus) suggestive of embolic etiology. She was recommended to follow up with cardiology  regarding possible [facility] candidacy or further workup.   Presents today with caregiver.  Her daughter is also very involved in her care.  [facility] and enjoys participating in the seated exercise courses, playing bingo. Notes some fatigue and activity intolerance. Prior to new finding of CVA was much more mobile and now wheelchair bound. Hopeful to start PT and return to ambulating. Isolated episode of chest pain while sitting and resting since last seen at which time EMS was called with unremarkable vital signs and no ED evaluation recommended.   ROS: Please see the history of present illness.    All other systems reviewed and are negative.   Studies Reviewed: .        Cardiac Studies & Procedures   CARDIAC CATHETERIZATION  CARDIAC CATHETERIZATION 11/13/2017  Narrative Conclusions: 1. Successful ultrasound and fluoroscopic guided pericardiocentesis and pericardial drain placement (tube pericardiostomy) via the subxiphoid approach yielding 620 mL of blood fluid.  Recommendations: 1. Transfer to 2 Heart for pericardial drain management. 2. Obtain STAT CXR when patient arrives in ICU to confirm tube placement and exclude complications such as pneumothorax. 3. Keep drain to negative pressure using Doval device; record output every 4 hours. 4. Drain to be removed when less than 50 mL of output in 24 hours and no significant residual effusion on echo. 5. Hold all anticoagulants at this time.  Yvonne Kendall, MD [facility] Pager: [phone]     ECHOCARDIOGRAM  ECHOCARDIOGRAM COMPLETE 11/15/2022  Narrative ECHOCARDIOGRAM REPORT    Patient Name:   Selena Bush Date of Exam: 11/15/2022 Medical Rec #:  [id number]       Height:       62.0 in Accession #:    [id number]      Weight:  138.4 lb Date of Birth:  Jan 25, 1946       BSA:          1.635 m Patient Age:    76 years        BP:           96/58 mmHg Patient Gender: F               HR:           52  bpm. Exam Location:  Outpatient  Procedure: 2D Echo, Cardiac Doppler and Color Doppler  Indications:    stroke  History:        Patient has prior history of Echocardiogram examinations, most recent 04/29/2021. CHF, Aortic Valve Disease, Arrythmias:Atrial Fibrillation; Risk Factors:Hypertension and Dyslipidemia.  Sonographer:    Mike Gip Referring Phys: [phone] Levert Feinstein  IMPRESSIONS   1. Left ventricular ejection fraction, by estimation, is 65 to 70%. Left ventricular ejection fraction by 2D MOD biplane is 69.6 %. The left ventricle has normal function. The left ventricle has no regional wall motion abnormalities. There is mild left ventricular hypertrophy. Left ventricular diastolic parameters are consistent with Grade I diastolic dysfunction (impaired relaxation). 2. Right ventricular systolic function is low normal. The right ventricular size is mildly enlarged. There is mildly elevated pulmonary artery systolic pressure. The estimated right ventricular systolic pressure is 38.9 mmHg. 3. Left atrial size was moderately dilated. 4. Right atrial size was mildly dilated. 5. The mitral valve is abnormal. Trivial mitral valve regurgitation. Moderate mitral annular calcification. 6. The non-coronary cusp exhibits decreased mobility. The aortic valve is tricuspid. There is mild calcification of the aortic valve. There is moderate thickening of the aortic valve. Aortic valve regurgitation is not visualized. No aortic stenosis is present. Aortic valve mean gradient measures 8.0 mmHg. 7. The inferior vena cava is normal in size with <50% respiratory variability, suggesting right atrial pressure of 8 mmHg.  Comparison(s): Changes from prior study are noted. 04/29/2021: LVEF 65-70%, grade 2 DD, severe LAE, fixed non-coronary cusp.  FINDINGS Left Ventricle: Left ventricular ejection fraction, by estimation, is 65 to 70%. Left ventricular ejection fraction by 2D MOD biplane is 69.6 %. The left  ventricle has normal function. The left ventricle has no regional wall motion abnormalities. The left ventricular internal cavity size was normal in size. There is mild left ventricular hypertrophy. Left ventricular diastolic parameters are consistent with Grade I diastolic dysfunction (impaired relaxation). Indeterminate filling pressures.  Right Ventricle: The right ventricular size is mildly enlarged. No increase in right ventricular wall thickness. Right ventricular systolic function is low normal. There is mildly elevated pulmonary artery systolic pressure. The tricuspid regurgitant velocity is 2.78 m/s, and with an assumed right atrial pressure of 8 mmHg, the estimated right ventricular systolic pressure is 38.9 mmHg.  Left Atrium: Left atrial size was moderately dilated.  Right Atrium: Right atrial size was mildly dilated.  Pericardium: There is no evidence of pericardial effusion.  Mitral Valve: The mitral valve is abnormal. Moderate mitral annular calcification. Trivial mitral valve regurgitation.  Tricuspid Valve: The tricuspid valve is grossly normal. Tricuspid valve regurgitation is mild.  Aortic Valve: The non-coronary cusp exhibits decreased mobility. The aortic valve is tricuspid. There is mild calcification of the aortic valve. There is moderate thickening of the aortic valve. Aortic valve regurgitation is not visualized. No aortic stenosis is present. Aortic valve mean gradient measures 8.0 mmHg.  Pulmonic Valve: The pulmonic valve was grossly normal. Pulmonic valve regurgitation is trivial.  Aorta:  The aortic root and ascending aorta are structurally normal, with no evidence of dilitation.  Venous: The inferior vena cava is normal in size with less than 50% respiratory variability, suggesting right atrial pressure of 8 mmHg.  IAS/Shunts: No atrial level shunt detected by color flow Doppler.   LEFT VENTRICLE PLAX 2D                        Biplane EF (MOD) LVIDd:          4.00 cm         LV Biplane EF:   Left LVIDs:         2.10 cm                          ventricular LV PW:         1.10 cm                          ejection LV IVS:        1.30 cm                          fraction by LVOT diam:     2.00 cm                          2D MOD LV SV:         101                              biplane is LV SV Index:   62                               69.6 %. LVOT Area:     3.14 cm Diastology LV e' medial:    3.92 cm/s LV Volumes (MOD)               LV E/e' medial:  18.0 LV vol d, MOD    72.7 ml       LV e' lateral:   7.40 cm/s A2C:                           LV E/e' lateral: 9.6 LV vol d, MOD    77.9 ml A4C: LV vol s, MOD    23.9 ml A2C: LV vol s, MOD    21.9 ml A4C: LV SV MOD A2C:   48.8 ml LV SV MOD A4C:   77.9 ml LV SV MOD BP:    52.5 ml  RIGHT VENTRICLE             IVC RV Basal diam:  4.00 cm     IVC diam: 2.00 cm RV S prime:     10.00 cm/s TAPSE (M-mode): 1.9 cm  LEFT ATRIUM             Index        RIGHT ATRIUM           Index LA diam:        4.20 cm 2.57 cm/m   RA Area:     11.50 cm LA Vol (A2C):   73.3 ml 44.84 ml/m  RA Volume:   22.50 ml  13.76 ml/m LA Vol (A4C):   60.4 ml 36.95 ml/m LA Biplane Vol: 69.6 ml 42.57 ml/m AORTIC VALVE AV Mean Grad: 8.0 mmHg LVOT Vmax:    128.00 cm/s LVOT Vmean:   84.800 cm/s LVOT VTI:     0.321 m  AORTA Ao Root diam: 3.50 cm Ao Asc diam:  3.70 cm  MITRAL VALVE               TRICUSPID VALVE MV Area (PHT): 3.16 cm    TR Peak grad:   30.9 mmHg MV Decel Time: 240 msec    TR Vmax:        278.00 cm/s MV E velocity: 70.70 cm/s MV A velocity: 68.10 cm/s  SHUNTS MV E/A ratio:  1.04        Systemic VTI:  0.32 m Systemic Diam: 2.00 cm  Zoila Shutter MD Electronically signed by Zoila Shutter MD Signature Date/Time: 11/15/2022/4:17:35 PM    Final             Risk Assessment/Calculations:    CHA2DS2-VASc Score = 6   This indicates a 9.7% annual risk of stroke. The patient's score is based upon: CHF  History: 1 HTN History: 1 Diabetes History: 0 Stroke History: 2 Vascular Disease History: 0 Age Score: 1 Gender Score: 1            Physical Exam:   VS:  BP 98/76   Pulse (!) 57   Ht 5\' 5"  (1.651 m)   BMI 23.96 kg/m    Wt Readings from Last 3 Encounters:  02/07/23 144 lb (65.3 kg)  01/12/23 140 lb (63.5 kg)  12/19/22 144 lb (65.3 kg)    GEN: Well nourished, well developed in no acute distress, in wheelchair NECK: No JVD; No carotid bruits CARDIAC: RRR, no murmurs, rubs, gallops RESPIRATORY:  Clear to auscultation without rales, wheezing or rhonchi  ABDOMEN: Soft, non-tender, non-distended EXTREMITIES:  No edema; No deformity   ASSESSMENT AND PLAN: .    PAF / Hx of hemorrhagic pericardial effusion - Initial atrial fib diagnosis 10/2017 however after initiation of Xarelto while hospitalized developped large hemorrhagic pericardial effusion with tamponade physiology requiring pericardiocentesis. She has been maintained on Sotalol and Metoprolol. Has not been anticoagulated previously as risk felt to outweigh benefit. MRI 12/2022 with finding of new embolic stroke. CHA2DS2-VASc Score = 6 [CHF History: 1, HTN History: 1, Diabetes History: 0, Stroke History: 2, Vascular Disease History: 0, Age Score: 1, Gender Score: 1].  Therefore, the patient's annual risk of stroke is 9.7 %.  Hesitant to utilize [facility] due to prior hemorrhagic pericardial effusion, falls risk. Will initiate Plavix 75mg  every day and route to Dr. Jens Som for advisement. Family understandably hesitant about Xarelto or like agents due to her history. Low suspicion she would be candidate for [facility] given Parkinsonism, but will route to Dr. Excell Seltzer for his review as well.  14 day ZIO placed in clinic to determine burden and rate control.   Parkinsonism - Follows with neurology.   Bilateral carotid artery stenosis-duplex 11/15/2022 bilateral 1 to 39% stenosis.  Lipid management, as below.  Hx of CVA / Physical deconditioning  - MRI 12/2022 with no chronic cortical infarcts with embolic appearance.  Following with Dr. Arbutus Leas of neurology.  Management of atrial fibrillation and OAC, as above. Echo 11/2022 normal LVEF 65 to 70%, no RWMA, mild LVH, grade 1 diastolic dysfunction, RV SF low normal, mild elevated PASP, LA moderately dilated, RA mildly dilated, trivial MR, aortic valve noncoronary  cusp exhibiting decreased mobility with no stenosis.  Echo bubble study not performed.  However, will defer at this time as I am not certain that a positive bubble would plan or change of management. Referred to PT at [facility]. Goal to increase mobility and return to standing, ambulating post CVA.   Aortic stenosis - Echo 11/15/2022 normal LVEF 65 to 70%, tricuspid aortic valve with moderate thickening and no stenosis visualized.  Prior echo 04/2019 with mild aortic stenosis.  Heart failure management, as below. No indication for repeat echocardiogram at this time.   Hypotension - Previous hypertension now with hypotension. Allow for permissive hypertension given parkinsonism but persistent hypotension.  Has admission to reduce Lasix, spironolactone due to known diastolic failure.  We discussed possible addition of midodrine which she politely declined today and wishes to consider again at follow-up. Would have low threshold to add midodrine 2.5 mg 3 times daily.  Chronic diastolic HF - Euvolemic and well compensated on exam. Low sodium diet, fluid restriction <2L, and daily weights encouraged. Educated to contact our office for weight gain of 2 lbs overnight or 5 lbs in one week. GDMT Furosemide 20mg  every day, Spironolactone 25mg  every day, Lopressor 50mg  BID.   HLD - Continue Zetia. Intolerant to statin. LDL goal <70. Consider PCSK9i at follow up. Not addressed at this clinic visit.        Dispo: follow up as scheduled 07/2023 with Dr. Jens Som or sooner pending ZIO monitor results  Signed, Alver Sorrow, NP

## 2023-02-14 NOTE — Telephone Encounter (Signed)
Rx printed and faxed to numbers given for [facility]

## 2023-02-14 NOTE — Telephone Encounter (Signed)
  Latoya from [facility] is calling back. She mentioned that the patient's prescription should be sent to the [facility] at fax numbers [phone] or [phone]. Additionally, she noted that we can include the patient's primary care provider, Tonye Becket.

## 2023-02-14 NOTE — Telephone Encounter (Signed)
H&V Care Navigation CSW Progress Note  Clinical Social Worker  received referral questions regarding pt pharmacy as she is a [facility] participant  to send in medications. Appears pharmacy is [facility]. Called [facility] twice to clarify, was transferred to nurse, left voicemail requesting she call office to clarify preferred pharmacy. Remain available as needed.   Patient is participating in a Managed Medicaid Plan:  No, [facility]  SDOH Screenings   Food Insecurity: No Food Insecurity (01/06/2022)  Transportation Needs: No Transportation Needs (01/06/2022)  Depression (PHQ2-9): Low Risk  (04/11/2022)  Financial Resource Strain: Low Risk  (01/06/2022)  Physical Activity: Insufficiently Active (01/06/2022)  Social Connections: Moderately Integrated (10/02/2017)  Stress: No Stress Concern Present (01/06/2022)  Tobacco Use: Low Risk  (02/14/2023)   Octavio Graves, MSW, LCSW Clinical Social Worker II [facility] Heart/Vascular Care Navigation  [phone]- work cell phone (preferred) [phone]- desk phone

## 2023-02-15 ENCOUNTER — Telehealth: Payer: Self-pay | Admitting: Neurology

## 2023-02-15 ENCOUNTER — Other Ambulatory Visit: Payer: Self-pay

## 2023-02-15 DIAGNOSIS — I6310 Cerebral infarction due to embolism of unspecified precerebral artery: Secondary | ICD-10-CM

## 2023-02-15 DIAGNOSIS — R531 Weakness: Secondary | ICD-10-CM

## 2023-02-15 NOTE — Telephone Encounter (Signed)
Patient has already had PT ordered by the cardiologist PA she saw

## 2023-02-15 NOTE — Telephone Encounter (Signed)
Send order to PT to [facility].  Dx:  Embolic cerebral infarctions, R sided weakness

## 2023-02-17 ENCOUNTER — Ambulatory Visit: Payer: Medicare (Managed Care) | Admitting: Neurology

## 2023-02-17 ENCOUNTER — Telehealth: Payer: Self-pay | Admitting: Neurology

## 2023-02-17 ENCOUNTER — Encounter (HOSPITAL_BASED_OUTPATIENT_CLINIC_OR_DEPARTMENT_OTHER): Payer: Self-pay

## 2023-02-17 DIAGNOSIS — I480 Paroxysmal atrial fibrillation: Secondary | ICD-10-CM

## 2023-02-17 DIAGNOSIS — I6523 Occlusion and stenosis of bilateral carotid arteries: Secondary | ICD-10-CM

## 2023-02-17 DIAGNOSIS — I5032 Chronic diastolic (congestive) heart failure: Secondary | ICD-10-CM

## 2023-02-17 DIAGNOSIS — I350 Nonrheumatic aortic (valve) stenosis: Secondary | ICD-10-CM

## 2023-02-17 DIAGNOSIS — Z8673 Personal history of transient ischemic attack (TIA), and cerebral infarction without residual deficits: Secondary | ICD-10-CM

## 2023-02-17 NOTE — Telephone Encounter (Signed)
Patient's Daughter is wanting information concerning Stroke rehab. Selena Bush

## 2023-02-17 NOTE — Telephone Encounter (Signed)
Talked to Herbert Seta patients daughter and Arita Miss is refusing to do Stroke therapy for this patient as they feel she is not getting any better. She spoke to the director Consuella Lose about please getting her mom some stroke therapy and Consuella Lose said they were doing neuro therapy and patient is not getting better

## 2023-02-17 NOTE — Telephone Encounter (Signed)
Note for nursing team:  If she prefers to start eliquis please, Discontinue Plavix Start Eliquis 5mg  BID Will need to fax Rx to [facility] at fax numbers [phone] or [phone].  CBC in 1 month (or on day of echocardiogram if she prefers) Echocardiogram 2 weeks after starting  Alver Sorrow, NP

## 2023-02-20 NOTE — Telephone Encounter (Signed)
Called patient, she is currently at [facility] for the day, ok to speak with daughter per [facility]. She will discuss options with her mom tonight. They will call tomorrow to give [place] her decision and we can move forward with setting everything up.

## 2023-02-21 MED ORDER — APIXABAN 5 MG PO TABS: 5.0000 mg | ORAL_TABLET | Freq: Two times a day (BID) | ORAL | 3 refills | Status: DC

## 2023-02-21 NOTE — Telephone Encounter (Signed)
Patient's daughter returned call, transferred from call center,   She is agreeable to Eliquis, labs and echo. Eliquis prescription and labs ordered printed and faxed to [facility]. Echo request sent to Scheduling team for assitance (will need to call [facility] for scheduling, as patient uses transportation- of note if [facility] cannot do the labs she can have labs done same day as echo)

## 2023-02-21 NOTE — Addendum Note (Signed)
Addended by: Marlene Lard on: 02/21/2023 11:12 AM   Modules accepted: Orders

## 2023-02-22 ENCOUNTER — Telehealth (HOSPITAL_BASED_OUTPATIENT_CLINIC_OR_DEPARTMENT_OTHER): Payer: Self-pay | Admitting: Family

## 2023-02-22 NOTE — Telephone Encounter (Signed)
Andy Gauss RN spoke with [place] NP  Plavix was d/c and patient to only be taking Eliquis, see myhcart message 7/5

## 2023-02-22 NOTE — Telephone Encounter (Signed)
New Message:     Please call Tonye Becket, NP, concerning patient's medicine

## 2023-02-23 ENCOUNTER — Ambulatory Visit: Payer: Medicare (Managed Care) | Admitting: Neurology

## 2023-02-28 ENCOUNTER — Telehealth: Payer: Self-pay

## 2023-02-28 ENCOUNTER — Telehealth: Payer: Self-pay | Admitting: Neurology

## 2023-02-28 NOTE — Telephone Encounter (Signed)
Esperanza Richters NP called to specify what kind of neuro rehab does the patient need they are already doing neuro rehab and didn't know hat else to add to the patients rehab routine. I did mention vestibular rehab as a rehab some of our patients do. I told her it really is up to [facility] and what they feel is best for the patient. I did tell her that patient was leaning hard to her right and unable to sit up or help herself sit up. Patients daughter is looking to help her mom in any way she can

## 2023-02-28 NOTE — Telephone Encounter (Signed)
Patients daughter is requesting a call back from nurse, states that it is really important/KB

## 2023-02-28 NOTE — Telephone Encounter (Signed)
Called daughter back and told her about my conversation with Tonye Becket NP at [facility]

## 2023-03-07 ENCOUNTER — Ambulatory Visit: Payer: Medicare (Managed Care) | Admitting: Neurology

## 2023-03-08 ENCOUNTER — Telehealth: Payer: Self-pay | Admitting: Cardiology

## 2023-03-08 MED ORDER — METOPROLOL TARTRATE 25 MG PO TABS: 25.0000 mg | ORAL_TABLET | Freq: Two times a day (BID) | ORAL | Status: DC

## 2023-03-08 NOTE — Telephone Encounter (Signed)
Pt c/o medication issue:  1. Name of Medication:   furosemide (LASIX) 20 MG tablet    2. How are you currently taking this medication (dosage and times per day)?   3. Are you having a reaction (difficulty breathing--STAT)?   4. What is your medication issue? Patient's PCP is calling to see if they can cut this medication to 10 mg. She states that patient is hypotension currently and is wanting to see if this helps first. Please advise.

## 2023-03-08 NOTE — Telephone Encounter (Signed)
Spoke with brittany, the patient is currently taking metoprolol 50 mg twice daily. Aware per dr Jens Som will decrease metoprolol to 25 mg BID. They will call back with concerns.

## 2023-03-08 NOTE — Telephone Encounter (Signed)
PCP [place] at [facility] states patient has been experiencing hypotension and would like to lower the dose of lasix from  20 mg to 10 mg because of her hypotension. Recent BP readings 99/54, 104/57,  88/63. She stated she would like to cut back  on lasix and eventually discontinue lasix all together. She stated she would like to see if this helps with her BP. If it doesn't then she would look at lowering or stopping other medications. She states patient does not want to do anything without consulting you first.

## 2023-03-09 ENCOUNTER — Ambulatory Visit (INDEPENDENT_AMBULATORY_CARE_PROVIDER_SITE_OTHER): Payer: Medicare (Managed Care) | Admitting: Podiatry

## 2023-03-09 DIAGNOSIS — M79675 Pain in left toe(s): Secondary | ICD-10-CM | POA: Diagnosis not present

## 2023-03-09 DIAGNOSIS — B351 Tinea unguium: Secondary | ICD-10-CM | POA: Diagnosis not present

## 2023-03-09 DIAGNOSIS — M79674 Pain in right toe(s): Secondary | ICD-10-CM | POA: Diagnosis not present

## 2023-03-09 DIAGNOSIS — L600 Ingrowing nail: Secondary | ICD-10-CM

## 2023-03-09 NOTE — Progress Notes (Signed)
Subjective: Selena Bush is a 77 y.o.  female returns to office today for follow up evaluation after having left Hallux medial border nail ingrown removal with phenol and alcohol matrixectomy approximately 2 weeks ago. Patient has been soaking using epsom satls and applying topical antibiotic covered with bandaid daily. Patient denies fevers, chills, nausea, vomiting. Denies any calf pain, chest pain, SOB. Pt has seen bleeding from the area, does take eliquis.  Objective:  Vitals: Reviewed  General: Well developed, nourished, in no acute distress, alert and oriented x3   Dermatology: Skin is warm, dry and supple bilateral. left hallux nail border appears to be clean, dry, with mild granular tissue and surrounding scab. There is no surrounding erythema, edema, drainage/purulence. The remaining nails appear unremarkable at this time. There are no other lesions or other signs of infection present. Remaining nails with dystrophy thickness and elongation x 5 bilateral feet. Pain due to nail length.  Neurovascular status: Intact. No lower extremity swelling; No pain with calf compression bilateral.  Musculoskeletal: Decreased tenderness to palpation of the left hallux nail fold(s). Muscular strength within normal limits bilateral.   Assesement and Plan: S/p phenol and alcohol matrixectomy to the  left hallux nail medial border, doing well.  Area is taking a while to heal due to Eliquis use  -Continue soaking in epsom salts twice a day followed by Betadine solution should and a band-aid. Can leave uncovered at night. Continue this until completely healed.  -If the area has not healed in 2 weeks, call the office for follow-up appointment, or sooner if any problems arise.  -Monitor for any signs/symptoms of infection. Call the office immediately if any occur or go directly to the emergency room. Call with any questions/concerns.  #Onychomycosis with pain  -Nails palliatively debrided as  below. -Educated on self-care  Procedure: Nail Debridement Rationale: Pain Type of Debridement: manual, sharp debridement. Instrumentation: Nail nipper, rotary burr. Number of Nails: 10        Corinna Gab, DPM [facility] / [facility]                   03/09/2023

## 2023-03-21 ENCOUNTER — Ambulatory Visit (INDEPENDENT_AMBULATORY_CARE_PROVIDER_SITE_OTHER): Payer: Medicare (Managed Care)

## 2023-03-21 DIAGNOSIS — I6523 Occlusion and stenosis of bilateral carotid arteries: Secondary | ICD-10-CM

## 2023-03-21 DIAGNOSIS — I480 Paroxysmal atrial fibrillation: Secondary | ICD-10-CM | POA: Diagnosis not present

## 2023-03-21 DIAGNOSIS — I5032 Chronic diastolic (congestive) heart failure: Secondary | ICD-10-CM | POA: Diagnosis not present

## 2023-03-21 DIAGNOSIS — I350 Nonrheumatic aortic (valve) stenosis: Secondary | ICD-10-CM

## 2023-03-21 DIAGNOSIS — Z8673 Personal history of transient ischemic attack (TIA), and cerebral infarction without residual deficits: Secondary | ICD-10-CM | POA: Diagnosis not present

## 2023-03-21 LAB — ECHOCARDIOGRAM COMPLETE
Area-P 1/2: 3.48 cm2
S' Lateral: 2.64 cm

## 2023-03-23 ENCOUNTER — Telehealth: Payer: Self-pay | Admitting: Family

## 2023-03-23 NOTE — Telephone Encounter (Signed)
03/23/2023 Message left to return call to provide ECHO results. Jim Like MHA RN CCM

## 2023-03-23 NOTE — Telephone Encounter (Signed)
Caller returned RN's call.

## 2023-03-23 NOTE — Telephone Encounter (Signed)
Pt's caregiver Rivka Barbara was returning nurse call regarding results and is requesting a callback. Please advise

## 2023-03-23 NOTE — Telephone Encounter (Signed)
Returned call to patient's daughter Herbert Seta (DPR on file).  Explained Echocardiogram results as per Gillian Shields.  Heather verbalized understanding of results and instructions as provided. Jim Like MHA RN CCM

## 2023-03-24 ENCOUNTER — Ambulatory Visit: Payer: Medicare (Managed Care) | Admitting: Physical Therapy

## 2023-03-28 ENCOUNTER — Telehealth: Payer: Self-pay | Admitting: Cardiology

## 2023-03-28 ENCOUNTER — Telehealth: Payer: Self-pay | Admitting: Family

## 2023-03-28 DIAGNOSIS — I5032 Chronic diastolic (congestive) heart failure: Secondary | ICD-10-CM

## 2023-03-28 NOTE — Telephone Encounter (Signed)
Pt caregiver called in stating pt is still swelling and unsure what to do until next appt. [facility] earlier to r/s pt's appt that was Friday due to transportation issue.

## 2023-03-28 NOTE — Telephone Encounter (Signed)
Calling to see if the appt on 8/19 is needed, since patient knows results.  if so appt needs to be reschedule. Please advise

## 2023-03-28 NOTE — Telephone Encounter (Signed)
Called and spoke with pt's daughter. Pt is still having swelling in the right ankle. Caregiver did give her an extra Lasix three days in a row as she was directed to do. Swelling did not go down so she did the 3 days in a row again. Foot and ankle is still swollen. Pt caregiver has only been giving pt 20mg  in the morning and 1 extra for the swelling. Order is to give one tablet my mouth once daily 20mg . She is wearing her compression sock. We need clarification on this order and recommendation on what you would like to do.

## 2023-03-28 NOTE — Telephone Encounter (Signed)
Called and LM on confidential VM that patient to F/U for several issues, including edema, at the appt. And to please keep it as scheduled for 8/19.  She can call if any further questions

## 2023-03-30 NOTE — Telephone Encounter (Signed)
Spoke with [facility], lab orders faxed to [phone]. They will fax the results to me.

## 2023-03-30 NOTE — Addendum Note (Signed)
Addended by: Freddi Starr on: 03/30/2023 02:47 PM   Modules accepted: Orders

## 2023-03-30 NOTE — Telephone Encounter (Signed)
Spoke with pt daughter, Aware of dr Ludwig Clarks recommendations. The patient is currently living at [facility], called and left message for [facility] to call me to see if they are able to drawn blood work for me.

## 2023-04-03 ENCOUNTER — Ambulatory Visit (HOSPITAL_BASED_OUTPATIENT_CLINIC_OR_DEPARTMENT_OTHER): Payer: Medicare (Managed Care) | Admitting: Family

## 2023-04-06 ENCOUNTER — Ambulatory Visit: Payer: Medicare (Managed Care) | Attending: Family

## 2023-04-06 ENCOUNTER — Telehealth: Payer: Self-pay

## 2023-04-06 DIAGNOSIS — R262 Difficulty in walking, not elsewhere classified: Secondary | ICD-10-CM | POA: Diagnosis present

## 2023-04-06 DIAGNOSIS — Z8673 Personal history of transient ischemic attack (TIA), and cerebral infarction without residual deficits: Secondary | ICD-10-CM

## 2023-04-06 DIAGNOSIS — R2689 Other abnormalities of gait and mobility: Secondary | ICD-10-CM | POA: Insufficient documentation

## 2023-04-06 DIAGNOSIS — R293 Abnormal posture: Secondary | ICD-10-CM | POA: Insufficient documentation

## 2023-04-06 DIAGNOSIS — R5381 Other malaise: Secondary | ICD-10-CM | POA: Insufficient documentation

## 2023-04-06 DIAGNOSIS — M6281 Muscle weakness (generalized): Secondary | ICD-10-CM | POA: Diagnosis present

## 2023-04-06 NOTE — Telephone Encounter (Signed)
Selena Shields, NP,   Colin Rhein was evaluated by PT on 8/22.  The patient would benefit from OT and ST evaluation for improved UE function as well as voice/swallowing assessment.   If you agree, please place an order in [facility] workque in [facility] or fax the order to [phone].  Thank you, Westley Foots, PT, DPT, [facility] [address] [place], [place]  [address] Phone:  [phone] Fax:  [phone]

## 2023-04-06 NOTE — Therapy (Signed)
OUTPATIENT PHYSICAL THERAPY NEURO EVALUATION   Patient Name: Selena Bush MRN: [id number] DOB:1946-06-19, 77 y.o., female Today's Date: 04/06/2023   PCP: Marny Lowenstein, MD REFERRING PROVIDER: Gillian Shields, NP  END OF SESSION:  PT End of Session - 04/06/23 1148     Visit Number 1    Number of Visits 17    Date for PT Re-Evaluation 06/16/23   to allow for scheduling delays   Authorization Type [facility]    PT Start Time 1146    PT Stop Time 1230    PT Time Calculation (min) 44 min    Equipment Utilized During Treatment Gait belt    Activity Tolerance Patient tolerated treatment well    Behavior During Therapy [facility] for tasks assessed/performed;Flat affect             Past Medical History:  Diagnosis Date   Anxiety    Atrial fibrillation (HCC)    Cat bite of right hand 03/25/2019   Chronic diastolic CHF (congestive heart failure) (HCC)    a. echo 09/2014: EF 60-65%, diastolic dysfunction, mild LVH, nl RV size & systolic function, mildly dilated LA (4.3 cm), mild MR/TR, mildly elevated PASP 36.7 mm Hg   DDD (degenerative disc disease), cervical    DDD (degenerative disc disease), lumbar    Depression    Diffuse cystic mastopathy 2014   Dyspnea    Dysrhythmia    GERD (gastroesophageal reflux disease)    Gout    Headache    rare   HLD (hyperlipidemia)    a. statin intolerant 2/2 myalgias   Hx of dysplastic nevus 12/25/2018   L medial ankle   Hypercholesterolemia    Hypertension    Malignant neoplasm of upper-outer quadrant of female breast (HCC) 10/2012   Papillary DCIS, sentinel node negative. DR/PR positive. PARTIAL RIGHT MASTECTOMY FOR BREAST CANCER--HAD RADIATION - NO CHEMO --DR. CRYSTAL [place] ONCOLOGIST   Obesity    OSA on CPAP    Osteoarthritis of both knees    a. s/p right TKA 04/2013 & left TKA 09/2014   Otitis externa    Parkinson disease    Personal history of radiation therapy 2015   RIGHT breast-mammosite per pt   Pneumonia of both lungs due to  infectious organism 01/08/2021   Sleep difficulties    LUNESTA HAS HELPED   Vaginal cyst    Past Surgical History:  Procedure Laterality Date   ABDOMINAL HYSTERECTOMY  1992   DUB; fibroids; endometriosis.  One remaining ovary.     BREAST LUMPECTOMY Right 2015   Papillary DCIS, sentinel node negative. DR/PR positive. PARTIAL RIGHT MASTECTOMY FOR BREAST CANCER--HAD RADIATION - NO CHEMO --DR. CRYSTAL [place] ONCOLOGIST   BREAST SURGERY Right 10/2012   Wide excision,APB RT 10 mm papillary DCIS, ER/PR positive. Sentinel node negative. Partial breast radiation.   CATARACT EXTRACTION, BILATERAL  02/13/2016   [facility].   CHOLECYSTECTOMY  1994   COLONOSCOPY  2015   1 benign polyp-every 5 years/ Dr Bluford Kaufmann   COLONOSCOPY WITH PROPOFOL N/A 02/10/2021   Procedure: COLONOSCOPY WITH PROPOFOL;  Surgeon: Earline Mayotte, MD;  Location: [facility] ENDOSCOPY;  Service: Endoscopy;  Laterality: N/A;   ERCP  1995   JOINT REPLACEMENT Right 04/2013   knee   PERICARDIOCENTESIS N/A 11/13/2017   Procedure: PERICARDIOCENTESIS;  Surgeon: Yvonne Kendall, MD;  Location: [facility] INVASIVE CV LAB;  Service: Cardiovascular;  Laterality: N/A;   TOTAL KNEE ARTHROPLASTY Right 04/16/2013   Procedure: RIGHT TOTAL KNEE ARTHROPLASTY;  Surgeon: Shelda Pal, MD;  Location: [facility] ORS;  Service: Orthopedics;  Laterality: Right;   TOTAL KNEE ARTHROPLASTY Left 09/15/2014   Procedure: LEFT TOTAL KNEE ARTHROPLASTY;  Surgeon: Shelda Pal, MD;  Location: [facility] ORS;  Service: Orthopedics;  Laterality: Left;   TUBAL LIGATION  1979   UPPER GI ENDOSCOPY     Patient Active Problem List   Diagnosis Date Noted   Cerebral vascular disease 10/10/2022   Gait abnormality 07/11/2022   Pressure injury of skin 06/05/2022   Rib fracture 06/04/2022   Stage 3a chronic kidney disease (CKD) (HCC) 06/04/2022   Skin ulcer with fat layer exposed (HCC) 03/16/2022   Vitamin B 12 deficiency 01/27/2022   Malaise 01/27/2022   Fever and chills 01/27/2022    Wheezing 01/27/2022   Vitamin D deficiency 01/27/2022   Hallucinations 11/24/2021   Abdominal pain 11/12/2021   Falls frequently 03/24/2021   Difficulty walking 04/04/2020   Hypophonia 01/15/2020   Numbness 12/28/2019   Left arm weakness 12/09/2019   Tremor 08/26/2019   Parkinsonism 07/29/2019   DDD (degenerative disc disease), lumbar 10/19/2018   Preventative health care 10/19/2018   Prediabetes 10/19/2018   (HFpEF) heart failure with preserved ejection fraction (HCC) 12/12/2017   Pericardial effusion with cardiac tamponade 11/13/2017   Paroxysmal atrial fibrillation (HCC) 11/05/2017   Acute cough 05/30/2017   Anxiety 03/21/2016   Insomnia 07/17/2015   MI (mitral incompetence) 12/10/2014   Major depressive disorder 12/09/2014   OSA (obstructive sleep apnea) 12/09/2014   GERD (gastroesophageal reflux disease) 12/09/2014   Chronic diastolic CHF (congestive heart failure) (HCC)    HLD (hyperlipidemia)    Osteoarthritis of both knees    S/P left TKA 08/19/2014   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 (BMI) 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 (HCC) 04/17/2013   History of ductal carcinoma in situ (DCIS) of breast 11/05/2012   Essential hypertension, benign 11/05/2012    ONSET DATE: 02/14/2023 referral  REFERRING DIAG: Z86.73 (ICD-10-CM) - History of CVA (cerebrovascular accident) R53.81 (ICD-10-CM) - Physical deconditioning  THERAPY DIAG:  Abnormal posture - Plan: PT plan of care cert/re-cert  Difficulty in walking, not elsewhere classified - Plan: PT plan of care cert/re-cert  Muscle weakness (generalized) - Plan: PT plan of care cert/re-cert  Other abnormalities of gait and mobility - Plan: PT plan of care cert/re-cert  Rationale for Evaluation and Treatment: Rehabilitation  SUBJECTIVE:                                                                                                                                                                                              SUBJECTIVE STATEMENT: Patient arrives to clinic  in wc, family member/care giver dropped patient off. Patient reports having multiple strokes recently and PD. States she has issues with R leg primarily. Multiple falls resulting in her needing a wc. In the wc she has 2 wedge pillows under B UE, but maintains profound R lateral lean. Awareness, but limited ability to correct. Per patient, she uses a Product/process development scientist to transfer at home. Urinates in "diaper" and uses Michiel Sites to transfer to toilet for bowel movement.   Pt accompanied by: self family member and/or caregiver that did not stay  PERTINENT HISTORY: PD, CVA, Depression, arthritis, GERD, CA, CHF, HLD, HTN  PAIN:  Are you having pain? No  PRECAUTIONS: Fall and Other: R hemi  WEIGHT BEARING RESTRICTIONS: No  FALLS: Has patient fallen in last 6 months? Yes. Number of falls 2-3, walking with a rollator   LIVING ENVIRONMENT: Lives with:  caregiver Lives in: House/apartment Stairs:  ramped entrance Has following equipment at home: Single point cane, Walker - 2 wheeled, Environmental consultant - 4 wheeled, Wheelchair (manual), shower chair, bed side commode, Ramped entry, and [employer] lift  PLOF: Needs assistance with ADLs, Needs assistance with homemaking, Needs assistance with gait, and Needs assistance with transfers  PATIENT GOALS: "I want to walk again"   OBJECTIVE:   DIAGNOSTIC FINDINGS: 01/13/23 MRI Brain IMPRESSION: 1. Suspected 5 mm early subacute infarct in the right precentral gyrus. 2. Severe chronic small vessel ischemic disease. New small chronic cortical infarcts in the left frontal and parietal lobes. 3. Progressive sinusitis.  COGNITION: Overall cognitive status: Within functional limits for tasks assessed   SENSATION: Intermittent numbness in R LE  COORDINATION: Unable to complete R LE due to weakness/tone  EDEMA:  Minimal distal B LE edema  MUSCLE TONE: RLE: Rigidity and Hypertonic  MUSCLE LENGTH: Decr hamstring length B    POSTURE: rounded shoulders, forward head, increased thoracic kyphosis, right pelvic obliquity, flexed trunk , and weight shift right  LOWER EXTREMITY MMT:   L LE grossly 3/5 R LE grossly 2/5 in minimal ROM  BED MOBILITY:  Essentially dependent   TRANSFERS: Assistive device utilized: Walker - 2 wheeled and 2 assist   Sit to stand: Total A Stand to sit: Max A   Wheelchair mobility: Dependent   TODAY'S TREATMENT:                                                                                                                              N/a eval   PATIENT EDUCATION: Education details: PT POC, exam findings, ambulation prognosis Person educated: Patient Education method: Explanation Education comprehension: verbalized understanding and needs further education  HOME EXERCISE PROGRAM: To be provided  GOALS: Goals reviewed with patient? Yes  SHORT TERM GOALS: Target date: 05/05/23  Pt will be independent with initial HEP for improved functional strength Baseline: to be provided Goal status: INITIAL  2.  Patient will complete sit <> stand with LRAD and MaxA x1  Baseline: Total A Goal status: INITIAL  3.  Patient will initiate gait training Baseline: non-ambulatory  Goal status: INITIAL  4.  Patient will maintain static sitting balance at EOB for at least 30s with no more than ModA Baseline: to be assessed  Goal status: INITIAL   LONG TERM GOALS: Target date: 06/16/23  Pt will be independent with final HEP for improved functional strength Baseline: to be provided Goal status: INITIAL  Patient will complete sit <> stand with LRAD and Mod x1  Baseline: Total A Goal status: INITIAL  Gait goal as appropriate Baseline: non-ambulatory  Goal status: INITIAL   Patient will maintain static sitting balance at EOB for at least 60s with no more than MinA Baseline: to be assessed  Goal status: INITIAL  ASSESSMENT:  CLINICAL IMPRESSION: Patient is a 77 y.o. female  who was seen today for physical therapy evaluation and treatment for impaired mobility related to multiple CVAs and PD. At eval, patient is essentially non-ambulatory and wc bound. She has significant R lateral lean in wc with awareness, but limited ability to self-correct. Even with external means (wedge pillows) patient maintaining this posture. She also has a significant forward head resting posture, which will impact her ability to progress toward independent transfers and gait eventually. She does report changes in her voice quality and swallowing ability as well as B UE dysfunction and would like a referral to both OT and ST. PT to communicate this to referring provider. If patient is unable to safely progress toward functional ambulation, she would benefit from a custom manual wheelchair, potentially a tilt in space to allow for better positioning to prevent further skin breakdown as well as minimize aspiration risk while eating/drinking due to severity of forward head posture. She would benefit from skilled PT services to address the above mentioned deficits.    OBJECTIVE IMPAIRMENTS: cardiopulmonary status limiting activity, decreased activity tolerance, decreased balance, decreased coordination, decreased endurance, decreased knowledge of condition, decreased knowledge of use of DME, decreased mobility, difficulty walking, decreased ROM, decreased strength, impaired perceived functional ability, impaired sensation, impaired tone, improper body mechanics, and postural dysfunction.   ACTIVITY LIMITATIONS: carrying, lifting, bending, sitting, standing, squatting, stairs, transfers, bed mobility, continence, bathing, toileting, dressing, self feeding, reach over head, hygiene/grooming, and locomotion level  PARTICIPATION LIMITATIONS: meal prep, cleaning, laundry, medication management, interpersonal relationship, shopping, and community activity  PERSONAL FACTORS: Age, Past/current experiences, Time  since onset of injury/illness/exacerbation, Transportation, and 3+ comorbidities: see above  are also affecting patient's functional outcome.   REHAB POTENTIAL: Fair time since onset  CLINICAL DECISION MAKING: Evolving/moderate complexity  EVALUATION COMPLEXITY: Moderate  PLAN:  PT FREQUENCY: 2x/week  PT DURATION: 8 weeks  PLANNED INTERVENTIONS: Therapeutic exercises, Therapeutic activity, Neuromuscular re-education, Balance training, Gait training, Patient/Family education, Self Care, Joint mobilization, Stair training, Vestibular training, Visual/preceptual remediation/compensation, Orthotic/Fit training, DME instructions, Aquatic Therapy, Manual therapy, and Re-evaluation  PLAN FOR NEXT SESSION: sitting balance, STS, transfers, gait in // bars, may ultimately need custom wc    Westley Foots, PT Westley Foots, PT, DPT, CBIS  04/06/2023, 1:04 PM

## 2023-04-12 ENCOUNTER — Ambulatory Visit: Payer: Medicare (Managed Care)

## 2023-04-24 NOTE — Progress Notes (Unsigned)
Assessment/Plan:   1.  Parkinsons disease, diagnosed 2021  -I do not think that the patient has an atypical state.  She does not describe the red flags, with the exception of diplopia and that did not develop for several years (3) into the disease.  I do not think the neck flexion is associated with Parkinson's, although it could be.  I think this is more orthopedic in nature.  She has apparently had that for many years.  -Patient is currently on Rytary 145 mg, 3 capsules 3 times per day.  -change q hs levodopa to rytary 145 mg   -PT referral -patient is getting ready to start PT outside of [facility], but daughter wanted a prescription for [facility] to restart PT as well.  I am not sure that Medicaid will pay for both of these and told her that, but daughter requested the prescription and I gave her that.  2.  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  Imaging since that time confirms that she has had multiple strokes, suggestive of embolic etiology.  Unfortunately, her history is such that she has had a hemorrhagic pericardial effusion after initiation of anticoagulation.  Her cardiologist initially felt she would not be a candidate for anticoagulation, but after review, they changed her mind and she is now on apixaban.  -Zio patch done July, 2024 with primarily sinus bradycardia, with brief episodes of atrial fibrillation.  -Records indicate that Dr. Excell Seltzer had reviewed her chart and thought she would be a good candidate for a Watchman device.  I do not see that they have followed up with cardiology since then.  However, she is back on apixaban so this is likely just a mute point.  -Talked extensively to the patient about stroke signs and symptoms and the importance of getting to the hospital should she have any of those.   3.  Low bP, likely neurogenic orthostatic hypotension  -Certainly worsened by metoprolol but dosage has been  reduced.   -Discussed with them the concept of permissive hypertension in Parkinson's disease. -Will be especially important to get this under better control if she is able to get up and ambulate again. -Cardiology notes from July, 2024 indicate that patient declined midodrine.  4.  Known history of atrial fibrillation  -Likely source of #2   Subjective:   Selena Bush was seen today in follow-up for her Parkinson's disease and her prior stroke with now right-sided weakness/spasticity.  Pt with daughter who supplements hx.  She saw the cardiology NP on July 2.  they started her on a Zio patch for 14 days.  Zio patch was done at the end of July and it was predominantly sinus bradycardia, but they noted brief episodes of atrial fibrillation.  They started Plavix and the nurse practitioner initially she was not a candidate for Watchman device.  Cardiology nurse practitioner stated that they discussed adding midodrine, but patient had declined that.  Not long after she saw cardiology, there was a MyChart message to the patient and they noted that they thought that patient should be on Eliquis, so her Plavix was discontinued.  She was started on apixaban.  In addition, Dr. Excell Seltzer had reviewed her medical history and felt she would, in fact, be a good candidate for the Watchman device.  She was doing PT but had completed PT but is going to start somewhere else tomorrow.  Has been a bit confused  lately and she is scheduled to have UA later.     ALLERGIES:   Allergies  Allergen Reactions   Penicillins Anaphylaxis    anaphylaxis  Has patient had a PCN reaction causing immediate rash, facial/tongue/throat swelling, SOB or lightheadedness with hypotension: Yes Has patient had a PCN reaction causing severe rash involving mucus membranes or skin necrosis: No Has patient had a PCN reaction that required hospitalization: No Has patient had a PCN reaction occurring within the last 10 years: No If all of  the above answers are "NO", then may proceed with Cephalosporin use.    Sulfa Antibiotics Anaphylaxis   Codeine Other (See Comments) and Nausea And Vomiting    Gi problems    Statins Other (See Comments)    Leg pains   Xarelto [Rivaroxaban] Swelling    pericarditis   Aspirin Other (See Comments)    "burned my stomach intensely" Abdominal pain and burning    CURRENT MEDICATIONS:  Current Meds  Medication Sig   acetaminophen (TYLENOL) 500 MG tablet Take 500 mg by mouth every 6 (six) hours as needed for moderate pain.   apixaban (ELIQUIS) 5 MG TABS tablet Take 1 tablet (5 mg total) by mouth 2 (two) times daily.   bisacodyl (DULCOLAX) 5 MG EC tablet Take 5 mg by mouth daily as needed for moderate constipation.   Carbidopa-Levodopa ER (RYTARY) 36.25-145 MG CPCR Take 3 tablets by mouth 3 (three) times daily. (Patient taking differently: Take 3 tablets by mouth 3 (three) times daily. 7 am 12 pm 5 pm)   Carbidopa-Levodopa ER (SINEMET CR) 25-100 MG tablet controlled release Take 1 tablet by mouth at bedtime.   Cholecalciferol (VITAMIN D-3) 25 MCG (1000 UT) CAPS Take 1 capsule by mouth daily.   Cyanocobalamin (VITAMIN B-12) 5000 MCG SUBL Place 1 each under the tongue daily.   DULoxetine (CYMBALTA) 60 MG capsule TAKE ONE CAPSULE BY MOUTH TWICE DAILY FOR anxiety AND depression (Patient taking differently: Take 60 mg by mouth 2 (two) times daily. TAKE ONE CAPSULE BY MOUTH TWICE DAILY FOR anxiety AND depression)   ezetimibe (ZETIA) 10 MG tablet TAKE ONE TABLET BY MOUTH EVERY MORNING (Patient taking differently: Take 10 mg by mouth daily.)   fluticasone (FLONASE) 50 MCG/ACT nasal spray Place 2 sprays into both nostrils daily as needed for rhinitis or allergies.   furosemide (LASIX) 20 MG tablet TAKE ONE TABLET BY MOUTH ONCE DAILY May take additional tablet daily if needed (Patient taking differently: Take 40 mg by mouth 2 (two) times daily.)   gabapentin (NEURONTIN) 600 MG tablet Take 600 mg by mouth  as needed.   JARDIANCE 10 MG TABS tablet TAKE ONE TABLET BY MOUTH BEFORE BREAKFAST (Patient taking differently: Take 10 mg by mouth daily.)   Melatonin 5 MG CHEW Chew 10 mg by mouth at bedtime.   metoprolol tartrate (LOPRESSOR) 25 MG tablet Take 1 tablet (25 mg total) by mouth 2 (two) times daily.   Multiple Vitamins-Minerals (CEROVITE SENIOR PO) Take 1 tablet by mouth daily.   oxybutynin (DITROPAN) 5 MG tablet Take 5 mg by mouth 2 (two) times daily.   sotalol (BETAPACE) 120 MG tablet TAKE ONE TABLET BY MOUTH TWICE DAILY (Patient taking differently: Take 120 mg by mouth 2 (two) times daily.)   spironolactone (ALDACTONE) 25 MG tablet Take 1 tablet (25 mg total) by mouth daily.   tiZANidine (ZANAFLEX) 4 MG tablet TAKE 2 TABLET (4 MG TOTAL) BY MOUTH DAILY FOR MUSCLE SPASMS AT BEDTIME. (Patient taking differently: Take 4  mg by mouth in the morning and at bedtime.)     Objective:   VITALS:   Vitals:   04/26/23 0906  BP: 116/74  Pulse: 60  SpO2: 91%  Weight: 144 lb (65.3 kg)  Height: 5\' 5"  (1.651 m)      GEN:  The patient appears stated age and is in NAD. HEENT:  Normocephalic, atraumatic.  The mucous membranes are moist. The superficial temporal arteries are without ropiness or tenderness. CV:  RRR Lungs:  CTAB Neck/HEME:  There are no carotid bruits bilaterally.  Claudie Fisherman is on the chest. Musculoskeletal: Patient is leaning to the right. Skin: Hands are cool to the touch and purpleish hue (patient and daughter both report that this is baseline and prior to apixaban)  Neurological examination:  Orientation: The patient is alert and oriented x3.  Cranial nerves: There is good facial symmetry. Extraocular muscles are intact. the speech is fluent and clear. Hearing is intact to conversational tone. Sensation: Sensation is intact to light touch throughout. Motor: Strength is 5/5 in the biceps.  She had mild trouble with deltoid strength on the right.  .  While she had trouble with hip  flexion on the right initially, when we did manual motor testing, strength was 4+/5.  Shoulder shrug is equal and symmetric.  There is no pronator drift.  She does list to the right within her wheelchair   Movement examination: Tone: There is mild spasticity in the right leg.  Tone elsewhere is normal. Abnormal movements: None seen today Coordination:  There is no real decremation with rapid alternating movements, but finger taps are slow bilaterally, right worse than left Gait and Station: Unable per patient and daughter I have reviewed and interpreted the following labs independently   Chemistry      Component Value Date/Time   NA 137 06/05/2022 0203   NA 142 05/21/2021 1505   NA 136 09/22/2014 0518   K 3.7 06/05/2022 0203   K 3.6 09/22/2014 0518   CL 104 06/05/2022 0203   CL 97 (L) 09/22/2014 0518   CO2 26 06/05/2022 0203   CO2 29 09/22/2014 0518   BUN 20 06/05/2022 0203   BUN 20 05/21/2021 1505   BUN 13 09/22/2014 0518   CREATININE 0.98 06/05/2022 0203   CREATININE 1.19 (H) 06/07/2016 1450      Component Value Date/Time   CALCIUM 9.6 06/05/2022 0203   CALCIUM 8.4 (L) 09/22/2014 0518   ALKPHOS 107 05/21/2021 1505   AST 12 05/21/2021 1505   ALT 5 05/21/2021 1505   BILITOT 0.3 05/21/2021 1505      Lab Results  Component Value Date   TSH 1.780 05/21/2021   Lab Results  Component Value Date   WBC 8.9 06/05/2022   HGB 13.6 06/05/2022   HCT 42.6 06/05/2022   MCV 99.8 06/05/2022   PLT 215 06/05/2022     Total time spent on today's visit was 30 minutes, including both face-to-face time and nonface-to-face time.  Time included that spent on review of records (prior notes available to me/labs/imaging if pertinent), discussing treatment and goals, answering patient's questions and coordinating care.  Cc:  Jethro Bastos, MD

## 2023-04-24 NOTE — Telephone Encounter (Signed)
Spoke with [facility] of [facility] traid., lab orders re-faxed to attn [facility].

## 2023-04-25 ENCOUNTER — Ambulatory Visit: Payer: Medicare (Managed Care) | Admitting: Neurology

## 2023-04-26 ENCOUNTER — Encounter: Payer: Self-pay | Admitting: Neurology

## 2023-04-26 ENCOUNTER — Ambulatory Visit: Payer: Medicare (Managed Care) | Attending: Family

## 2023-04-26 ENCOUNTER — Ambulatory Visit (INDEPENDENT_AMBULATORY_CARE_PROVIDER_SITE_OTHER): Payer: Medicare (Managed Care) | Admitting: Neurology

## 2023-04-26 ENCOUNTER — Ambulatory Visit: Payer: Medicare (Managed Care) | Admitting: Nurse Practitioner

## 2023-04-26 VITALS — BP 116/74 | HR 60 | Ht 65.0 in | Wt 144.0 lb

## 2023-04-26 DIAGNOSIS — R293 Abnormal posture: Secondary | ICD-10-CM | POA: Insufficient documentation

## 2023-04-26 DIAGNOSIS — R1312 Dysphagia, oropharyngeal phase: Secondary | ICD-10-CM | POA: Insufficient documentation

## 2023-04-26 DIAGNOSIS — R2689 Other abnormalities of gait and mobility: Secondary | ICD-10-CM | POA: Insufficient documentation

## 2023-04-26 DIAGNOSIS — R262 Difficulty in walking, not elsewhere classified: Secondary | ICD-10-CM | POA: Diagnosis present

## 2023-04-26 DIAGNOSIS — R278 Other lack of coordination: Secondary | ICD-10-CM | POA: Diagnosis present

## 2023-04-26 DIAGNOSIS — R29898 Other symptoms and signs involving the musculoskeletal system: Secondary | ICD-10-CM | POA: Diagnosis present

## 2023-04-26 DIAGNOSIS — M6281 Muscle weakness (generalized): Secondary | ICD-10-CM | POA: Insufficient documentation

## 2023-04-26 DIAGNOSIS — I6310 Cerebral infarction due to embolism of unspecified precerebral artery: Secondary | ICD-10-CM | POA: Diagnosis not present

## 2023-04-26 DIAGNOSIS — R471 Dysarthria and anarthria: Secondary | ICD-10-CM | POA: Insufficient documentation

## 2023-04-26 DIAGNOSIS — R29818 Other symptoms and signs involving the nervous system: Secondary | ICD-10-CM | POA: Diagnosis present

## 2023-04-26 DIAGNOSIS — G20A1 Parkinson's disease without dyskinesia, without mention of fluctuations: Secondary | ICD-10-CM

## 2023-04-26 MED ORDER — RYTARY 36.25-145 MG PO CPCR: ORAL_CAPSULE | ORAL | 11 refills | Status: DC

## 2023-04-26 NOTE — Therapy (Signed)
OUTPATIENT PHYSICAL THERAPY NEURO EVALUATION   Patient Name: ANAGHA MCGILLIS MRN: [id number] DOB:02-24-1946, 77 y.o., female Today's Date: 04/26/2023   PCP: Marny Lowenstein, MD REFERRING PROVIDER: Gillian Shields, NP  END OF SESSION:  PT End of Session - 04/26/23 1313     Visit Number 2    Number of Visits 17    Date for PT Re-Evaluation 06/16/23    Authorization Type [facility]    PT Start Time 1315    PT Stop Time 1405    PT Time Calculation (min) 50 min    Equipment Utilized During Treatment Other (comment)   sara lift   Activity Tolerance Patient tolerated treatment well    Behavior During Therapy [facility] for tasks assessed/performed;Flat affect             Past Medical History:  Diagnosis Date   Anxiety    Atrial fibrillation (HCC)    Cat bite of right hand 03/25/2019   Chronic diastolic CHF (congestive heart failure) (HCC)    a. echo 09/2014: EF 60-65%, diastolic dysfunction, mild LVH, nl RV size & systolic function, mildly dilated LA (4.3 cm), mild MR/TR, mildly elevated PASP 36.7 mm Hg   DDD (degenerative disc disease), cervical    DDD (degenerative disc disease), lumbar    Depression    Diffuse cystic mastopathy 2014   Dyspnea    Dysrhythmia    GERD (gastroesophageal reflux disease)    Gout    Headache    rare   HLD (hyperlipidemia)    a. statin intolerant 2/2 myalgias   Hx of dysplastic nevus 12/25/2018   L medial ankle   Hypercholesterolemia    Hypertension    Malignant neoplasm of upper-outer quadrant of female breast (HCC) 10/2012   Papillary DCIS, sentinel node negative. DR/PR positive. PARTIAL RIGHT MASTECTOMY FOR BREAST CANCER--HAD RADIATION - NO CHEMO --DR. CRYSTAL [place] ONCOLOGIST   Obesity    OSA on CPAP    Osteoarthritis of both knees    a. s/p right TKA 04/2013 & left TKA 09/2014   Otitis externa    Parkinson disease    Personal history of radiation therapy 2015   RIGHT breast-mammosite per pt   Pneumonia of both lungs due to infectious  organism 01/08/2021   Sleep difficulties    LUNESTA HAS HELPED   Vaginal cyst    Past Surgical History:  Procedure Laterality Date   ABDOMINAL HYSTERECTOMY  1992   DUB; fibroids; endometriosis.  One remaining ovary.     BREAST LUMPECTOMY Right 2015   Papillary DCIS, sentinel node negative. DR/PR positive. PARTIAL RIGHT MASTECTOMY FOR BREAST CANCER--HAD RADIATION - NO CHEMO --DR. CRYSTAL [place] ONCOLOGIST   BREAST SURGERY Right 10/2012   Wide excision,APB RT 10 mm papillary DCIS, ER/PR positive. Sentinel node negative. Partial breast radiation.   CATARACT EXTRACTION, BILATERAL  02/13/2016   [facility].   CHOLECYSTECTOMY  1994   COLONOSCOPY  2015   1 benign polyp-every 5 years/ Dr Bluford Kaufmann   COLONOSCOPY WITH PROPOFOL N/A 02/10/2021   Procedure: COLONOSCOPY WITH PROPOFOL;  Surgeon: Earline Mayotte, MD;  Location: [facility] ENDOSCOPY;  Service: Endoscopy;  Laterality: N/A;   ERCP  1995   JOINT REPLACEMENT Right 04/2013   knee   PERICARDIOCENTESIS N/A 11/13/2017   Procedure: PERICARDIOCENTESIS;  Surgeon: Yvonne Kendall, MD;  Location: [facility] INVASIVE CV LAB;  Service: Cardiovascular;  Laterality: N/A;   TOTAL KNEE ARTHROPLASTY Right 04/16/2013   Procedure: RIGHT TOTAL KNEE ARTHROPLASTY;  Surgeon: Shelda Pal, MD;  Location: [facility]  ORS;  Service: Orthopedics;  Laterality: Right;   TOTAL KNEE ARTHROPLASTY Left 09/15/2014   Procedure: LEFT TOTAL KNEE ARTHROPLASTY;  Surgeon: Shelda Pal, MD;  Location: [facility] ORS;  Service: Orthopedics;  Laterality: Left;   TUBAL LIGATION  1979   UPPER GI ENDOSCOPY     Patient Active Problem List   Diagnosis Date Noted   Cerebral vascular disease 10/10/2022   Gait abnormality 07/11/2022   Pressure injury of skin 06/05/2022   Rib fracture 06/04/2022   Stage 3a chronic kidney disease (CKD) (HCC) 06/04/2022   Skin ulcer with fat layer exposed (HCC) 03/16/2022   Vitamin B 12 deficiency 01/27/2022   Malaise 01/27/2022   Fever and chills 01/27/2022   Wheezing  01/27/2022   Vitamin D deficiency 01/27/2022   Hallucinations 11/24/2021   Abdominal pain 11/12/2021   Falls frequently 03/24/2021   Difficulty walking 04/04/2020   Hypophonia 01/15/2020   Numbness 12/28/2019   Left arm weakness 12/09/2019   Tremor 08/26/2019   Parkinsonism 07/29/2019   DDD (degenerative disc disease), lumbar 10/19/2018   Preventative health care 10/19/2018   Prediabetes 10/19/2018   (HFpEF) heart failure with preserved ejection fraction (HCC) 12/12/2017   Pericardial effusion with cardiac tamponade 11/13/2017   Paroxysmal atrial fibrillation (HCC) 11/05/2017   Acute cough 05/30/2017   Anxiety 03/21/2016   Insomnia 07/17/2015   MI (mitral incompetence) 12/10/2014   Major depressive disorder 12/09/2014   OSA (obstructive sleep apnea) 12/09/2014   GERD (gastroesophageal reflux disease) 12/09/2014   Chronic diastolic CHF (congestive heart failure) (HCC)    HLD (hyperlipidemia)    Osteoarthritis of both knees    S/P left TKA 08/19/2014   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 (BMI) 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 (HCC) 04/17/2013   History of ductal carcinoma in situ (DCIS) of breast 11/05/2012   Essential hypertension, benign 11/05/2012    ONSET DATE: 02/14/2023 referral  REFERRING DIAG: Z86.73 (ICD-10-CM) - History of CVA (cerebrovascular accident) R53.81 (ICD-10-CM) - Physical deconditioning  THERAPY DIAG:  Abnormal posture  Difficulty in walking, not elsewhere classified  Other abnormalities of gait and mobility  Muscle weakness (generalized)  Rationale for Evaluation and Treatment: Rehabilitation  SUBJECTIVE:                                                                                                                                                                                             SUBJECTIVE STATEMENT: Patient arrives to clinic in [place] with friend. Was taken off of "extended release sinemet." Has been doing some gentle exercises. Her  lift at home sounds like a sara stedy rather than a hoyer.  Denies falls.   Pt accompanied by: self family member and/or caregiver that did not stay  PERTINENT HISTORY: PD, CVA, Depression, arthritis, GERD, CA, CHF, HLD, HTN  PAIN:  Are you having pain? No  PRECAUTIONS: Fall and Other: R hemi  PATIENT GOALS: "I want to walk again"   OBJECTIVE:   DIAGNOSTIC FINDINGS: 01/13/23 MRI Brain IMPRESSION: 1. Suspected 5 mm early subacute infarct in the right precentral gyrus. 2. Severe chronic small vessel ischemic disease. New small chronic cortical infarcts in the left frontal and parietal lobes. 3. Progressive sinusitis.   TODAY'S TREATMENT:                                                                                                                              -Sara lift 3x1 min stands  -minimal ability to engage glutes/quads for full upright stand  -wc discussion re: possible need for custom wc for positioning and pressure relief  -prescribed initial HEP   PATIENT EDUCATION: Education details: see above, initial HEP Person educated: Patient Education method: Explanation Education comprehension: verbalized understanding and needs further education  HOME EXERCISE PROGRAM: Access Code: [email or web address] URL: [email or web address] Date: 04/26/2023 Prepared by: Merry Lofty  Exercises - Supine Gluteal Sets  - 1 x daily - 7 x weekly - 3 sets - 10 reps - Supine Hip Abduction  - 1 x daily - 7 x weekly - 3 sets - 10 reps - Supine Heel Slide  - 1 x daily - 7 x weekly - 3 sets - 10 reps - Supine Ankle Pumps  - 1 x daily - 7 x weekly - 3 sets - 10 reps - Seated Scapular Retraction  - 1 x daily - 7 x weekly - 3 sets - 10 reps  GOALS: Goals reviewed with patient? Yes  SHORT TERM GOALS: Target date: 05/05/23  Pt will be independent with initial HEP for improved functional strength Baseline: to be provided Goal status: INITIAL  2.  Patient will complete sit <> stand with  LRAD and MaxA x1  Baseline: Total A Goal status: INITIAL  3.  Patient will initiate gait training Baseline: non-ambulatory  Goal status: INITIAL  4.  Patient will maintain static sitting balance at EOB for at least 30s with no more than ModA Baseline: to be assessed  Goal status: INITIAL   LONG TERM GOALS: Target date: 06/16/23  Pt will be independent with final HEP for improved functional strength Baseline: to be provided Goal status: INITIAL  Patient will complete sit <> stand with LRAD and Mod x1  Baseline: Total A Goal status: INITIAL  Gait goal as appropriate Baseline: non-ambulatory  Goal status: INITIAL   Patient will maintain static sitting balance at EOB for at least 60s with no more than MinA Baseline: to be assessed  Goal status: INITIAL  ASSESSMENT:  CLINICAL IMPRESSION: Patient seen for skilled PT session with emphasis on standing tolerance and initial HEP. Patient and friend describing standing frame  transfer to/from bed. Tolerated standing in [place] lift for ~1 min at a time, but due to shoulder positioning/posture, unable to achieve fully erect position. Head maintains flexed, R rotated and R laterally flexed position as well. Seated in the wheelchair, patient with profound R lateral lean. Friend notes use of wedge pillows in wc to maintain trunk midline, but then the shoulders are significantly internally rotated and protracted. Patient will benefit greatly from a custom manual wheelchair with probable recline feature for pressure relief/posture control. Her prognosis for being a functional ambulator for distances greater than short household distances is poor. Continue POC.   OBJECTIVE IMPAIRMENTS: cardiopulmonary status limiting activity, decreased activity tolerance, decreased balance, decreased coordination, decreased endurance, decreased knowledge of condition, decreased knowledge of use of DME, decreased mobility, difficulty walking, decreased ROM, decreased  strength, impaired perceived functional ability, impaired sensation, impaired tone, improper body mechanics, and postural dysfunction.   ACTIVITY LIMITATIONS: carrying, lifting, bending, sitting, standing, squatting, stairs, transfers, bed mobility, continence, bathing, toileting, dressing, self feeding, reach over head, hygiene/grooming, and locomotion level  PARTICIPATION LIMITATIONS: meal prep, cleaning, laundry, medication management, interpersonal relationship, shopping, and community activity  PERSONAL FACTORS: Age, Past/current experiences, Time since onset of injury/illness/exacerbation, Transportation, and 3+ comorbidities: see above  are also affecting patient's functional outcome.   REHAB POTENTIAL: Fair time since onset  CLINICAL DECISION MAKING: Evolving/moderate complexity  EVALUATION COMPLEXITY: Moderate  PLAN:  PT FREQUENCY: 2x/week  PT DURATION: 8 weeks  PLANNED INTERVENTIONS: Therapeutic exercises, Therapeutic activity, Neuromuscular re-education, Balance training, Gait training, Patient/Family education, Self Care, Joint mobilization, Stair training, Vestibular training, Visual/preceptual remediation/compensation, Orthotic/Fit training, DME instructions, Aquatic Therapy, Manual therapy, and Re-evaluation  PLAN FOR NEXT SESSION: sitting balance, STS, transfers, gait in // bars, may ultimately need custom wc    Westley Foots, PT Westley Foots, PT, DPT, CBIS  04/26/2023, 2:33 PM

## 2023-04-26 NOTE — Patient Instructions (Signed)
STOP carbidopa/levodopa CR at bed Add an additional Rytary 145 mg at bed (1 capsule)  SAVE THE DATE!  We are planning a Parkinsons Disease educational symposium at [facility] in [place] on October 11.  More details to come!  If you would like to be added to our email list to get further information, email [email or web address].  To sign up, you can email [email or web address].  We hope to see you there!

## 2023-04-28 ENCOUNTER — Ambulatory Visit: Payer: Medicare (Managed Care) | Admitting: Physical Therapy

## 2023-04-28 DIAGNOSIS — R293 Abnormal posture: Secondary | ICD-10-CM

## 2023-04-28 DIAGNOSIS — M6281 Muscle weakness (generalized): Secondary | ICD-10-CM

## 2023-04-28 DIAGNOSIS — R2689 Other abnormalities of gait and mobility: Secondary | ICD-10-CM

## 2023-04-28 NOTE — Therapy (Addendum)
OUTPATIENT PHYSICAL THERAPY NEURO TREATMENT- RECERTIFICATION   Patient Name: Selena Bush MRN: [id number] DOB:1945-10-28, 77 y.o., female Today's Date: 04/28/2023   PCP: Marny Lowenstein, MD REFERRING PROVIDER: Gillian Shields, NP  END OF SESSION:  PT End of Session - 04/28/23 1240     Visit Number 3    Number of Visits 17    Date for PT Re-Evaluation 06/16/23    Authorization Type [facility]    PT Start Time 1231    PT Stop Time 1319    PT Time Calculation (min) 48 min    Equipment Utilized During Treatment Gait belt    Activity Tolerance Patient tolerated treatment well    Behavior During Therapy WFL for tasks assessed/performed;Flat affect              Past Medical History:  Diagnosis Date   Anxiety    Atrial fibrillation (HCC)    Cat bite of right hand 03/25/2019   Chronic diastolic CHF (congestive heart failure) (HCC)    a. echo 09/2014: EF 60-65%, diastolic dysfunction, mild LVH, nl RV size & systolic function, mildly dilated LA (4.3 cm), mild MR/TR, mildly elevated PASP 36.7 mm Hg   DDD (degenerative disc disease), cervical    DDD (degenerative disc disease), lumbar    Depression    Diffuse cystic mastopathy 2014   Dyspnea    Dysrhythmia    GERD (gastroesophageal reflux disease)    Gout    Headache    rare   HLD (hyperlipidemia)    a. statin intolerant 2/2 myalgias   Hx of dysplastic nevus 12/25/2018   L medial ankle   Hypercholesterolemia    Hypertension    Malignant neoplasm of upper-outer quadrant of female breast (HCC) 10/2012   Papillary DCIS, sentinel node negative. DR/PR positive. PARTIAL RIGHT MASTECTOMY FOR BREAST CANCER--HAD RADIATION - NO CHEMO --DR. CRYSTAL [place] ONCOLOGIST   Obesity    OSA on CPAP    Osteoarthritis of both knees    a. s/p right TKA 04/2013 & left TKA 09/2014   Otitis externa    Parkinson disease    Personal history of radiation therapy 2015   RIGHT breast-mammosite per pt   Pneumonia of both lungs due to infectious  organism 01/08/2021   Sleep difficulties    LUNESTA HAS HELPED   Vaginal cyst    Past Surgical History:  Procedure Laterality Date   ABDOMINAL HYSTERECTOMY  1992   DUB; fibroids; endometriosis.  One remaining ovary.     BREAST LUMPECTOMY Right 2015   Papillary DCIS, sentinel node negative. DR/PR positive. PARTIAL RIGHT MASTECTOMY FOR BREAST CANCER--HAD RADIATION - NO CHEMO --DR. CRYSTAL [place] ONCOLOGIST   BREAST SURGERY Right 10/2012   Wide excision,APB RT 10 mm papillary DCIS, ER/PR positive. Sentinel node negative. Partial breast radiation.   CATARACT EXTRACTION, BILATERAL  02/13/2016   [facility].   CHOLECYSTECTOMY  1994   COLONOSCOPY  2015   1 benign polyp-every 5 years/ Dr Bluford Kaufmann   COLONOSCOPY WITH PROPOFOL N/A 02/10/2021   Procedure: COLONOSCOPY WITH PROPOFOL;  Surgeon: Earline Mayotte, MD;  Location: [facility] ENDOSCOPY;  Service: Endoscopy;  Laterality: N/A;   ERCP  1995   JOINT REPLACEMENT Right 04/2013   knee   PERICARDIOCENTESIS N/A 11/13/2017   Procedure: PERICARDIOCENTESIS;  Surgeon: Yvonne Kendall, MD;  Location: [facility] INVASIVE CV LAB;  Service: Cardiovascular;  Laterality: N/A;   TOTAL KNEE ARTHROPLASTY Right 04/16/2013   Procedure: RIGHT TOTAL KNEE ARTHROPLASTY;  Surgeon: Shelda Pal, MD;  Location: [facility] ORS;  Service: Orthopedics;  Laterality: Right;   TOTAL KNEE ARTHROPLASTY Left 09/15/2014   Procedure: LEFT TOTAL KNEE ARTHROPLASTY;  Surgeon: Shelda Pal, MD;  Location: [facility] ORS;  Service: Orthopedics;  Laterality: Left;   TUBAL LIGATION  1979   UPPER GI ENDOSCOPY     Patient Active Problem List   Diagnosis Date Noted   Cerebral vascular disease 10/10/2022   Gait abnormality 07/11/2022   Pressure injury of skin 06/05/2022   Rib fracture 06/04/2022   Stage 3a chronic kidney disease (CKD) (HCC) 06/04/2022   Skin ulcer with fat layer exposed (HCC) 03/16/2022   Vitamin B 12 deficiency 01/27/2022   Malaise 01/27/2022   Fever and chills 01/27/2022   Wheezing  01/27/2022   Vitamin D deficiency 01/27/2022   Hallucinations 11/24/2021   Abdominal pain 11/12/2021   Falls frequently 03/24/2021   Difficulty walking 04/04/2020   Hypophonia 01/15/2020   Numbness 12/28/2019   Left arm weakness 12/09/2019   Tremor 08/26/2019   Parkinsonism 07/29/2019   DDD (degenerative disc disease), lumbar 10/19/2018   Preventative health care 10/19/2018   Prediabetes 10/19/2018   (HFpEF) heart failure with preserved ejection fraction (HCC) 12/12/2017   Pericardial effusion with cardiac tamponade 11/13/2017   Paroxysmal atrial fibrillation (HCC) 11/05/2017   Acute cough 05/30/2017   Anxiety 03/21/2016   Insomnia 07/17/2015   MI (mitral incompetence) 12/10/2014   Major depressive disorder 12/09/2014   OSA (obstructive sleep apnea) 12/09/2014   GERD (gastroesophageal reflux disease) 12/09/2014   Chronic diastolic CHF (congestive heart failure) (HCC)    HLD (hyperlipidemia)    Osteoarthritis of both knees    S/P left TKA 08/19/2014   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 (BMI) 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 (HCC) 04/17/2013   History of ductal carcinoma in situ (DCIS) of breast 11/05/2012   Essential hypertension, benign 11/05/2012    ONSET DATE: 02/14/2023 referral  REFERRING DIAG: Z86.73 (ICD-10-CM) - History of CVA (cerebrovascular accident) R53.81 (ICD-10-CM) - Physical deconditioning  THERAPY DIAG:  Abnormal posture  Other abnormalities of gait and mobility  Muscle weakness (generalized)  Rationale for Evaluation and Treatment: Rehabilitation  SUBJECTIVE:                                                                                                                                                                                             SUBJECTIVE STATEMENT: Patient arrives to clinic in wc alone. Denies acute changes since last visit. States her neck is cramping.   Pt accompanied by: self   PERTINENT HISTORY: PD, CVA, Depression,  arthritis, GERD, CA, CHF, HLD, HTN  PAIN:  Are you having  pain? No and Yes: NPRS scale: 3-4/10 Pain location: Neck  Pain description: Achy, spasm   PRECAUTIONS: Fall and Other: R hemi  PATIENT GOALS: "I want to walk again"   OBJECTIVE:   DIAGNOSTIC FINDINGS: 01/13/23 MRI Brain IMPRESSION: 1. Suspected 5 mm early subacute infarct in the right precentral gyrus. 2. Severe chronic small vessel ischemic disease. New small chronic cortical infarcts in the left frontal and parietal lobes. 3. Progressive sinusitis.   TODAY'S TREATMENT:                                                                                                                               Ther Act   Trigger Point Dry-Needling  Treatment instructions: Expect mild to moderate muscle soreness. S/S of pneumothorax if dry needled over a lung field, and to seek immediate medical attention should they occur. Patient verbalized understanding of these instructions and education.  Patient Consent Given: Yes Education handout provided: Yes Muscles treated: L and R upper traps Treatment response/outcome: deep ache/pressure; muscle twitch detected; minimal trigger points detected in RUE despite increased tightness in this muscle  Trigger Point Dry Needling  What is Trigger Point Dry Needling (DN)? DN is a physical therapy technique used to treat muscle pain and dysfunction. Specifically, DN helps deactivate muscle trigger points (muscle knots).  A thin filiform needle is used to penetrate the skin and stimulate the underlying trigger point. The goal is for a local twitch response (LTR) to occur and for the trigger point to relax. No medication of any kind is injected during the procedure.   What Does Trigger Point Dry Needling Feel Like?  The procedure feels different for each individual patient. Some patients report that they do not actually feel the needle enter the skin and overall the process is not painful. Very mild  bleeding may occur. However, many patients feel a deep cramping in the muscle in which the needle was inserted. This is the local twitch response.   How Will I feel after the treatment? Soreness is normal, and the onset of soreness may not occur for a few hours. Typically this soreness does not last longer than two days.  Bruising is uncommon, however; ice can be used to decrease any possible bruising.  In rare cases feeling tired or nauseous after the treatment is normal. In addition, your symptoms may get worse before they get better, this period will typically not last longer than 24 hours.   What Can I do After My Treatment? Increase your hydration by drinking more water for the next 24 hours. You may place ice or heat on the areas treated that have become sore, however, do not use heat on inflamed or bruised areas. Heat often brings more relief post needling. You can continue your regular activities, but vigorous activity is not recommended initially after the treatment for 24 hours. DN is best combined with other physical therapy such as strengthening, stretching, and other therapies.  Provided manual SCM and upper trap stretch to R side following TPDN x2 minutes and pt able to facilitate active muscle contraction of L cervical musculature to maintain more neutral head position.  With second therapist, performed lateral slide board transfer from [facility] to hi-lo mat on R side w/total A x2. Provided max multimodal cues to facilitate anterior lean, proper hand positioning and head-hip relationship. Pt unable to lean to L side, requiring max A for proper body positioning prior to transfer. Pt denied pain w/transfer.  Pt able to sit edge of mat w/CGA and mod verbal cues for weight shift to L side and anterior lean, as she tends to lean posterolaterally to R. Pt able to sit without UE support and reach for therapists hands to high-five at various positions, but most challenged by reaching w/RUE to L  side.  Performed ball tosses, x5 minutes, with one therapist providing CGA-light min A posteriorly to pt and other therapist tossing ball. Pt able to catch ball if cued for "wide hands" but could not throw ball back to therapist. However, pt able to initiate shoulder protraction required to toss ball back to therapist.  Performed lateral slide board transfer w/total A x2 to L side w/max Ax2. Placed pt's feet on 4" step to provide leverage to push during transfer and pt able to assist therapists as she can more easily lean to R side.    PATIENT EDUCATION: Education details: Importance of performing HEP and instructions to perform HEP in seated position as well as supine, TPDN (see above) Person educated: Patient Education method: Explanation and Handouts Education comprehension: verbalized understanding and needs further education  HOME EXERCISE PROGRAM: Access Code: [email or web address] URL: [email or web address] Date: 04/26/2023 Prepared by: Merry Lofty  Exercises - Supine Gluteal Sets  - 1 x daily - 7 x weekly - 3 sets - 10 reps - Supine Hip Abduction  - 1 x daily - 7 x weekly - 3 sets - 10 reps - Supine Heel Slide  - 1 x daily - 7 x weekly - 3 sets - 10 reps - Supine Ankle Pumps  - 1 x daily - 7 x weekly - 3 sets - 10 reps - Seated Scapular Retraction  - 1 x daily - 7 x weekly - 3 sets - 10 reps  GOALS: Goals reviewed with patient? Yes  SHORT TERM GOALS: Target date: 05/05/23  Pt will be independent with initial HEP for improved functional strength Baseline: to be provided Goal status: INITIAL  2.  Patient will complete sit <> stand with LRAD and MaxA x1  Baseline: Total A Goal status: INITIAL  3.  Patient will initiate gait training Baseline: non-ambulatory  Goal status: INITIAL  4.  Patient will maintain static sitting balance at EOB for at least 30s with no more than ModA Baseline: to be assessed; CGA-min A (9/13) Goal status: MET   LONG TERM GOALS: Target  date: 06/16/23  Pt will be independent with final HEP for improved functional strength Baseline: to be provided Goal status: INITIAL  Patient will complete sit <> stand with LRAD and Mod x1  Baseline: Total A Goal status: INITIAL  Gait goal as appropriate Baseline: non-ambulatory  Goal status: INITIAL   Patient will maintain static sitting balance at EOB for at least 60s with no more than MinA Baseline: to be assessed; CGA-min A (9/13) Goal status: MET  ASSESSMENT:  CLINICAL IMPRESSION: Emphasis of skilled PT session on pain modulation via TPDN, slide board transfers and sitting balance. Pt  tolerated TPDN well and was able to facilitate L cervical sidebending independently following intervention. Pt required max-total A x2 to perform slide board transfer, w/reduced assistance required to transfer to L side vs R. Pt able to hold her balance at edge of mat for >5 minutes w/CGA-light min A, meeting one of her STGs and one of her LTGs. Continue POC.   OBJECTIVE IMPAIRMENTS: cardiopulmonary status limiting activity, decreased activity tolerance, decreased balance, decreased coordination, decreased endurance, decreased knowledge of condition, decreased knowledge of use of DME, decreased mobility, difficulty walking, decreased ROM, decreased strength, impaired perceived functional ability, impaired sensation, impaired tone, improper body mechanics, and postural dysfunction.   ACTIVITY LIMITATIONS: carrying, lifting, bending, sitting, standing, squatting, stairs, transfers, bed mobility, continence, bathing, toileting, dressing, self feeding, reach over head, hygiene/grooming, and locomotion level  PARTICIPATION LIMITATIONS: meal prep, cleaning, laundry, medication management, interpersonal relationship, shopping, and community activity  PERSONAL FACTORS: Age, Past/current experiences, Time since onset of injury/illness/exacerbation, Transportation, and 3+ comorbidities: see above  are also  affecting patient's functional outcome.   REHAB POTENTIAL: Fair time since onset  CLINICAL DECISION MAKING: Evolving/moderate complexity  EVALUATION COMPLEXITY: Moderate  PLAN:  PT FREQUENCY: 2x/week  PT DURATION: 8 weeks  PLANNED INTERVENTIONS: Therapeutic exercises, Therapeutic activity, Neuromuscular re-education, Balance training, Gait training, Patient/Family education, Self Care, Joint mobilization, Stair training, Vestibular training, Visual/preceptual remediation/compensation, Orthotic/Fit training, DME instructions, Aquatic Therapy, Dry Needling, Manual therapy, and Re-evaluation  PLAN FOR NEXT SESSION: sitting balance, STS, transfers, gait in // bars, may ultimately need custom wc    Herrick Hartog E Rollen Selders, PT Peter Congo, PT, DPT, CSRS   04/28/2023, 2:11 PM

## 2023-05-01 ENCOUNTER — Ambulatory Visit: Payer: Medicare (Managed Care) | Admitting: Neurology

## 2023-05-01 ENCOUNTER — Telehealth: Payer: Self-pay

## 2023-05-01 NOTE — Telephone Encounter (Signed)
Dr. Arbutus Leas,  Colin Rhein was evaluated by PT on 04/06/23.  The patient would benefit from a referral for a custom manual wheelchair evaluation for improved positioning and pressure distribution.    If you agree, please place an order in [facility] workque in [facility] or fax the order to [phone].  Thank you, Westley Foots, PT, DPT, [facility] [address] [place], [place]  [address] Phone:  [phone] Fax:  [phone]

## 2023-05-01 NOTE — Telephone Encounter (Signed)
Pt's daughter called in to thank [facility] and wants to make sure she is kept in the loop.

## 2023-05-01 NOTE — Therapy (Signed)
[facility] [address] [place], [place], [address] Phone: [phone]   Fax:  [phone]  Patient Details  Name: Selena Bush MRN: [id number] Date of Birth: 07-Jan-1946 Referring Provider:  No ref. provider found  Encounter Date: 05/01/2023  9:00am: PT called social worker Set designer re: authorization/ process to obtain a custom manual wheelchair for the patient as she has significant positioning needs and is currently in an ill-fitting standard wheelchair. Social worker informed this PT that she would send a message to "the team" and we would receive an answer regarding this by the end of the day.   ~1300: received phone call from OT and PT at [facility] that patient was not authorized to be seen at this clinic and that "[facility] will not be paying for this." Per [facility] PT and OT, patient was meant to have a referral to this clinic for vestibular rehab; however, both PT referrals that were received were for post- CVA. This PT asked whether or not patient should be discharged from this clinic and [facility] PT/OT stated that they "would handle all communications with the patient from here on out." This PT and other therapists at this clinic asked the patient and her family numerous times if she was received PT at [facility] and all reported that she was not.   Westley Foots, PT Westley Foots, PT, DPT, CBIS  05/01/2023, 1:23 PM  [facility] [address] [place], [place], [address] Phone: [phone]   Fax:  [phone]

## 2023-05-01 NOTE — Telephone Encounter (Signed)
I called Merry Lofty at [facility] which is what I spoke to Tonye Becket NP about not Consuella Lose. Victorino Dike said that Arita Miss is transferring patient in a stand up frame hurting her arms refusing to help her and only putting pillows beside her and patient is going to get pneumonia or aspirate due to them not having any hope for the patient to get any better although neuro said patient does have some potential and they are trying to help them

## 2023-05-01 NOTE — Telephone Encounter (Signed)
I received a message from [facility] Nian PT to ask how they can best help this patient after Kaleen Mask has called there office and refused treatment. Her would love to have Dr. Iona Beard opinion on if there is anything they can do to help this patient

## 2023-05-01 NOTE — Telephone Encounter (Signed)
Pateitns daughter called our office this afternoon wanting me to speak with Consuella Lose at [facility]. Consuella Lose is an Acupuncturist and head of the therapy dept. Consuella Lose has discontinued neuro rehab for this patient in the outpatient setting she has also denied a specialized wheelchair that the PT dept here at [facility] has suggested. I may have made an error in my own referral as vestibular therapy was mentioned and patient has has several traumatic brain injuries resulting in her balance to even sit up has been effected.  Consuella Lose called to let me know that there is no hope that this patient is going to get better so the same therapy that they have been doing over the past year and half before the CVA is substantial enough. I let Consuella Lose know that we do not look at things that way and feel every patient deserves the right to try to get better and not be shot down. Consuella Lose very demeaning asking me if I understood the big words she was saying and why did I order these things for this patient. I let Consuella Lose know that Dr. Arbutus Leas is the patients DR not me and that I am just trying to help facilitate the best care we can offer for our patient. I also told Consuella Lose that patients daughter said that Consuella Lose said that have called here a number of times and I never return there call and I let her know yes I do the only call received was with NP Tonye Becket were we dicussed this therapy and she agreed to try to see if patient got any better. Patient has gotten better and patients daughter was simply wanting to talk to [facility] about maybe trying for a little longer. I called [facility] to get there opinion on this patient ans see if I am in the wrong and if patient is doing better. I did tell Consuella Lose we can exchange documentation instead of word of mouth and see were patient is not doing any better

## 2023-05-02 ENCOUNTER — Encounter: Payer: Self-pay | Admitting: Physical Therapy

## 2023-05-02 ENCOUNTER — Telehealth: Payer: Self-pay | Admitting: Cardiology

## 2023-05-02 ENCOUNTER — Other Ambulatory Visit: Payer: Self-pay

## 2023-05-02 DIAGNOSIS — R29898 Other symptoms and signs involving the musculoskeletal system: Secondary | ICD-10-CM

## 2023-05-02 DIAGNOSIS — R531 Weakness: Secondary | ICD-10-CM

## 2023-05-02 DIAGNOSIS — G20A1 Parkinson's disease without dyskinesia, without mention of fluctuations: Secondary | ICD-10-CM

## 2023-05-02 DIAGNOSIS — I6310 Cerebral infarction due to embolism of unspecified precerebral artery: Secondary | ICD-10-CM

## 2023-05-02 MED ORDER — NONFORMULARY OR COMPOUNDED ITEM: 0 refills | Status: DC

## 2023-05-02 NOTE — Telephone Encounter (Signed)
Filled out and put in Dr. Iona Beard box for signature

## 2023-05-02 NOTE — Telephone Encounter (Signed)
Called provided number. Left message for Enrique Sack with fax number.

## 2023-05-02 NOTE — Telephone Encounter (Signed)
Selena Bush from pt's PCP is requesting a callback from nurse Stanton Kidney regarding lab results she requested. She stated she spoke with her yesterday but the fax number she was given is not working. It's giving her a message stating the fax number is under maintenance and unable to receive messages right now. Please advise

## 2023-05-03 ENCOUNTER — Encounter: Payer: Self-pay | Admitting: Occupational Therapy

## 2023-05-03 ENCOUNTER — Ambulatory Visit: Payer: Medicare (Managed Care) | Admitting: Occupational Therapy

## 2023-05-03 ENCOUNTER — Ambulatory Visit: Payer: Medicare (Managed Care)

## 2023-05-03 ENCOUNTER — Encounter: Payer: Self-pay | Admitting: Neurology

## 2023-05-03 ENCOUNTER — Ambulatory Visit: Payer: Medicare (Managed Care) | Admitting: Speech Pathology

## 2023-05-03 ENCOUNTER — Ambulatory Visit: Payer: Medicare (Managed Care) | Admitting: Physician Assistant

## 2023-05-03 ENCOUNTER — Encounter: Payer: Self-pay | Admitting: Speech Pathology

## 2023-05-03 DIAGNOSIS — R29818 Other symptoms and signs involving the nervous system: Secondary | ICD-10-CM

## 2023-05-03 DIAGNOSIS — M6281 Muscle weakness (generalized): Secondary | ICD-10-CM

## 2023-05-03 DIAGNOSIS — R293 Abnormal posture: Secondary | ICD-10-CM | POA: Diagnosis not present

## 2023-05-03 DIAGNOSIS — R262 Difficulty in walking, not elsewhere classified: Secondary | ICD-10-CM

## 2023-05-03 DIAGNOSIS — R471 Dysarthria and anarthria: Secondary | ICD-10-CM

## 2023-05-03 DIAGNOSIS — R1312 Dysphagia, oropharyngeal phase: Secondary | ICD-10-CM

## 2023-05-03 DIAGNOSIS — R278 Other lack of coordination: Secondary | ICD-10-CM

## 2023-05-03 DIAGNOSIS — R29898 Other symptoms and signs involving the musculoskeletal system: Secondary | ICD-10-CM

## 2023-05-03 DIAGNOSIS — R2689 Other abnormalities of gait and mobility: Secondary | ICD-10-CM

## 2023-05-03 NOTE — Therapy (Signed)
OUTPATIENT PHYSICAL THERAPY NEURO TREATMENT   Patient Name: Selena Bush MRN: [id number] DOB:1945-10-18, 77 y.o., female Today's Date: 05/03/2023   PCP: Marny Lowenstein, MD REFERRING PROVIDER: Gillian Shields, NP  END OF SESSION:  PT End of Session - 05/03/23 1240     Visit Number 4    Number of Visits 17    Date for PT Re-Evaluation 06/16/23    Authorization Type [facility]    PT Start Time 1315    PT Stop Time 1400    PT Time Calculation (min) 45 min    Equipment Utilized During Treatment Gait belt    Activity Tolerance Patient tolerated treatment well    Behavior During Therapy WFL for tasks assessed/performed;Flat affect              Past Medical History:  Diagnosis Date   Anxiety    Atrial fibrillation (HCC)    Cat bite of right hand 03/25/2019   Chronic diastolic CHF (congestive heart failure) (HCC)    a. echo 09/2014: EF 60-65%, diastolic dysfunction, mild LVH, nl RV size & systolic function, mildly dilated LA (4.3 cm), mild MR/TR, mildly elevated PASP 36.7 mm Hg   DDD (degenerative disc disease), cervical    DDD (degenerative disc disease), lumbar    Depression    Diffuse cystic mastopathy 2014   Dyspnea    Dysrhythmia    GERD (gastroesophageal reflux disease)    Gout    Headache    rare   HLD (hyperlipidemia)    a. statin intolerant 2/2 myalgias   Hx of dysplastic nevus 12/25/2018   L medial ankle   Hypercholesterolemia    Hypertension    Malignant neoplasm of upper-outer quadrant of female breast (HCC) 10/2012   Papillary DCIS, sentinel node negative. DR/PR positive. PARTIAL RIGHT MASTECTOMY FOR BREAST CANCER--HAD RADIATION - NO CHEMO --DR. CRYSTAL [place] ONCOLOGIST   Obesity    OSA on CPAP    Osteoarthritis of both knees    a. s/p right TKA 04/2013 & left TKA 09/2014   Otitis externa    Parkinson disease    Personal history of radiation therapy 2015   RIGHT breast-mammosite per pt   Pneumonia of both lungs due to infectious organism 01/08/2021    Sleep difficulties    LUNESTA HAS HELPED   Vaginal cyst    Past Surgical History:  Procedure Laterality Date   ABDOMINAL HYSTERECTOMY  1992   DUB; fibroids; endometriosis.  One remaining ovary.     BREAST LUMPECTOMY Right 2015   Papillary DCIS, sentinel node negative. DR/PR positive. PARTIAL RIGHT MASTECTOMY FOR BREAST CANCER--HAD RADIATION - NO CHEMO --DR. CRYSTAL [place] ONCOLOGIST   BREAST SURGERY Right 10/2012   Wide excision,APB RT 10 mm papillary DCIS, ER/PR positive. Sentinel node negative. Partial breast radiation.   CATARACT EXTRACTION, BILATERAL  02/13/2016   [facility].   CHOLECYSTECTOMY  1994   COLONOSCOPY  2015   1 benign polyp-every 5 years/ Dr Bluford Kaufmann   COLONOSCOPY WITH PROPOFOL N/A 02/10/2021   Procedure: COLONOSCOPY WITH PROPOFOL;  Surgeon: Earline Mayotte, MD;  Location: [facility] ENDOSCOPY;  Service: Endoscopy;  Laterality: N/A;   ERCP  1995   JOINT REPLACEMENT Right 04/2013   knee   PERICARDIOCENTESIS N/A 11/13/2017   Procedure: PERICARDIOCENTESIS;  Surgeon: Yvonne Kendall, MD;  Location: [facility] INVASIVE CV LAB;  Service: Cardiovascular;  Laterality: N/A;   TOTAL KNEE ARTHROPLASTY Right 04/16/2013   Procedure: RIGHT TOTAL KNEE ARTHROPLASTY;  Surgeon: Shelda Pal, MD;  Location: [facility] ORS;  Service: Orthopedics;  Laterality: Right;   TOTAL KNEE ARTHROPLASTY Left 09/15/2014   Procedure: LEFT TOTAL KNEE ARTHROPLASTY;  Surgeon: Shelda Pal, MD;  Location: [facility] ORS;  Service: Orthopedics;  Laterality: Left;   TUBAL LIGATION  1979   UPPER GI ENDOSCOPY     Patient Active Problem List   Diagnosis Date Noted   Cerebral vascular disease 10/10/2022   Gait abnormality 07/11/2022   Pressure injury of skin 06/05/2022   Rib fracture 06/04/2022   Stage 3a chronic kidney disease (CKD) (HCC) 06/04/2022   Skin ulcer with fat layer exposed (HCC) 03/16/2022   Vitamin B 12 deficiency 01/27/2022   Malaise 01/27/2022   Fever and chills 01/27/2022   Wheezing 01/27/2022   Vitamin D  deficiency 01/27/2022   Hallucinations 11/24/2021   Abdominal pain 11/12/2021   Falls frequently 03/24/2021   Difficulty walking 04/04/2020   Hypophonia 01/15/2020   Numbness 12/28/2019   Left arm weakness 12/09/2019   Tremor 08/26/2019   Parkinsonism 07/29/2019   DDD (degenerative disc disease), lumbar 10/19/2018   Preventative health care 10/19/2018   Prediabetes 10/19/2018   (HFpEF) heart failure with preserved ejection fraction (HCC) 12/12/2017   Pericardial effusion with cardiac tamponade 11/13/2017   Paroxysmal atrial fibrillation (HCC) 11/05/2017   Acute cough 05/30/2017   Anxiety 03/21/2016   Insomnia 07/17/2015   MI (mitral incompetence) 12/10/2014   Major depressive disorder 12/09/2014   OSA (obstructive sleep apnea) 12/09/2014   GERD (gastroesophageal reflux disease) 12/09/2014   Chronic diastolic CHF (congestive heart failure) (HCC)    HLD (hyperlipidemia)    Osteoarthritis of both knees    S/P left TKA 08/19/2014   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 (BMI) 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 (HCC) 04/17/2013   History of ductal carcinoma in situ (DCIS) of breast 11/05/2012   Essential hypertension, benign 11/05/2012    ONSET DATE: 02/14/2023 referral  REFERRING DIAG: Z86.73 (ICD-10-CM) - History of CVA (cerebrovascular accident) R53.81 (ICD-10-CM) - Physical deconditioning  THERAPY DIAG:  Abnormal posture  Other abnormalities of gait and mobility  Muscle weakness (generalized)  Difficulty in walking, not elsewhere classified  Rationale for Evaluation and Treatment: Rehabilitation  SUBJECTIVE:                                                                                                                                                                                             SUBJECTIVE STATEMENT: Patient arrives to clinic alone in wc. Reports her neck felt better after DN and would be interested in this again. Denies falls/near falls.   Pt  accompanied by: self   PERTINENT HISTORY: PD, CVA,  Depression, arthritis, GERD, CA, CHF, HLD, HTN  PAIN:  Are you having pain? No and Yes: NPRS scale: 3-4/10 Pain location: Neck  Pain description: Achy, spasm   PRECAUTIONS: Fall and Other: R hemi  PATIENT GOALS: "I want to walk again"   OBJECTIVE:   DIAGNOSTIC FINDINGS: 01/13/23 MRI Brain IMPRESSION: 1. Suspected 5 mm early subacute infarct in the right precentral gyrus. 2. Severe chronic small vessel ischemic disease. New small chronic cortical infarcts in the left frontal and parietal lobes. 3. Progressive sinusitis.   TODAY'S TREATMENT:              -standing in // bars: MaxA + multimodal cuing for anterior weight shift   -patient remaining slightly retropulsive with heavy reliance on B UE on bars -standing in // bars + lateral weight shift: MaxA, once standing: MinA/ModA  -2x5' gait in // bars with MaxA to stand, MinA for postural support + blocking B knees   -maintaining R lateral flexion + R knee flexion  PATIENT EDUCATION: Education details: added hamstring stretches to HEP, intentions to get manual TIS wc Person educated: Patient Education method: Explanation and Handouts Education comprehension: verbalized understanding and needs further education  HOME EXERCISE PROGRAM: Access Code: [email or web address] URL: [email or web address] Date: 04/26/2023 Prepared by: Merry Lofty  Exercises - Supine Gluteal Sets  - 1 x daily - 7 x weekly - 3 sets - 10 reps - Supine Hip Abduction  - 1 x daily - 7 x weekly - 3 sets - 10 reps - Supine Heel Slide  - 1 x daily - 7 x weekly - 3 sets - 10 reps - Supine Ankle Pumps  - 1 x daily - 7 x weekly - 3 sets - 10 reps - Seated Scapular Retraction  - 1 x daily - 7 x weekly - 3 sets - 10 reps - Supine Hamstring Stretch with Caregiver  - 1 x daily - 7 x weekly - 3 sets - 30s hold - Seated Hamstring Stretch with Chair  - 1 x daily - 7 x weekly - 3 sets - 30s hold  GOALS: Goals  reviewed with patient? Yes  SHORT TERM GOALS: Target date: 05/05/23  Pt will be independent with initial HEP for improved functional strength Baseline: to be provided Goal status: INITIAL  2.  Patient will complete sit <> stand with LRAD and MaxA x1  Baseline: Total A Goal status: INITIAL  3.  Patient will initiate gait training Baseline: non-ambulatory; initiated 9/18  Goal status: MET  4.  Patient will maintain static sitting balance at EOB for at least 30s with no more than ModA Baseline: to be assessed; CGA-min A (9/13) Goal status: MET   LONG TERM GOALS: Target date: 06/16/23  Pt will be independent with final HEP for improved functional strength Baseline: to be provided Goal status: INITIAL  Patient will complete sit <> stand with LRAD and Mod x1  Baseline: Total A Goal status: INITIAL  Patient will ambulate at least 58ft in // bars with no more than ModA + wc follow as necessary Baseline: MaxA x23ft + wc follow Goal status: REVISED   Patient will maintain static sitting balance at EOB for at least 60s with no more than MinA Baseline: to be assessed; CGA-min A (9/13) Goal status: MET  ASSESSMENT:  CLINICAL IMPRESSION: Patient seen for skilled PT session with emphasis on gait training in // bars. Remains with significantly forward flexed, R lateral flexion and slight L rotation head positioning + R lateral trunk  lean. R knee contracted lacking ~15* full extension and this is greatly impacting her ability to stand fully erect and thus ambulate independently. HEP updated for hamstring stretches. Patient able to ambulate in // bars today with MaxA to stand, MinA to maintain postural stability, no assist to advance individual LE, but contralateral limb was guarded against knee buckling in stance. She remains appropriate for a custom manual wheelchair, especially a tilt in space given cervical posturing and need to minimize risk for aspiration pneumonia. PT will coordinate with  [facility] and [facility] for this. Continue POC.   OBJECTIVE IMPAIRMENTS: cardiopulmonary status limiting activity, decreased activity tolerance, decreased balance, decreased coordination, decreased endurance, decreased knowledge of condition, decreased knowledge of use of DME, decreased mobility, difficulty walking, decreased ROM, decreased strength, impaired perceived functional ability, impaired sensation, impaired tone, improper body mechanics, and postural dysfunction.   ACTIVITY LIMITATIONS: carrying, lifting, bending, sitting, standing, squatting, stairs, transfers, bed mobility, continence, bathing, toileting, dressing, self feeding, reach over head, hygiene/grooming, and locomotion level  PARTICIPATION LIMITATIONS: meal prep, cleaning, laundry, medication management, interpersonal relationship, shopping, and community activity  PERSONAL FACTORS: Age, Past/current experiences, Time since onset of injury/illness/exacerbation, Transportation, and 3+ comorbidities: see above  are also affecting patient's functional outcome.   REHAB POTENTIAL: Fair time since onset  CLINICAL DECISION MAKING: Evolving/moderate complexity  EVALUATION COMPLEXITY: Moderate  PLAN:  PT FREQUENCY: 2x/week  PT DURATION: 8 weeks  PLANNED INTERVENTIONS: Therapeutic exercises, Therapeutic activity, Neuromuscular re-education, Balance training, Gait training, Patient/Family education, Self Care, Joint mobilization, Stair training, Vestibular training, Visual/preceptual remediation/compensation, Orthotic/Fit training, DME instructions, Aquatic Therapy, Manual therapy, and Re-evaluation  PLAN FOR NEXT SESSION: sitting balance, STS, transfers, gait in // bars, may ultimately need custom wc    Westley Foots, PT Westley Foots, PT, DPT, CBIS    05/03/2023, 3:11 PM

## 2023-05-03 NOTE — Therapy (Signed)
OUTPATIENT OCCUPATIONAL THERAPY NEURO EVALUATION  Patient Name: Selena Bush MRN: [id number] DOB:12/10/1945, 77 y.o., female Today's Date: 05/03/2023  PCP: Jethro Bastos, MD  REFERRING PROVIDER: Alver Sorrow, NP  END OF SESSION:  OT End of Session - 05/03/23 1450     Visit Number 1    Number of Visits 17    Date for OT Re-Evaluation 06/28/23    Authorization Type [employer]    OT Start Time 1450    OT Stop Time 1530    OT Time Calculation (min) 40 min    Activity Tolerance Patient tolerated treatment well    Behavior During Therapy [facility] for tasks assessed/performed;Flat affect             Past Medical History:  Diagnosis Date   Anxiety    Atrial fibrillation (HCC)    Cat bite of right hand 03/25/2019   Chronic diastolic CHF (congestive heart failure) (HCC)    a. echo 09/2014: EF 60-65%, diastolic dysfunction, mild LVH, nl RV size & systolic function, mildly dilated LA (4.3 cm), mild MR/TR, mildly elevated PASP 36.7 mm Hg   DDD (degenerative disc disease), cervical    DDD (degenerative disc disease), lumbar    Depression    Diffuse cystic mastopathy 2014   Dyspnea    Dysrhythmia    GERD (gastroesophageal reflux disease)    Gout    Headache    rare   HLD (hyperlipidemia)    a. statin intolerant 2/2 myalgias   Hx of dysplastic nevus 12/25/2018   L medial ankle   Hypercholesterolemia    Hypertension    Malignant neoplasm of upper-outer quadrant of female breast (HCC) 10/2012   Papillary DCIS, sentinel node negative. DR/PR positive. PARTIAL RIGHT MASTECTOMY FOR BREAST CANCER--HAD RADIATION - NO CHEMO --DR. CRYSTAL [place] ONCOLOGIST   Obesity    OSA on CPAP    Osteoarthritis of both knees    a. s/p right TKA 04/2013 & left TKA 09/2014   Otitis externa    Parkinson disease    Personal history of radiation therapy 2015   RIGHT breast-mammosite per pt   Pneumonia of both lungs due to infectious organism 01/08/2021   Sleep difficulties    LUNESTA HAS  HELPED   Vaginal cyst    Past Surgical History:  Procedure Laterality Date   ABDOMINAL HYSTERECTOMY  1992   DUB; fibroids; endometriosis.  One remaining ovary.     BREAST LUMPECTOMY Right 2015   Papillary DCIS, sentinel node negative. DR/PR positive. PARTIAL RIGHT MASTECTOMY FOR BREAST CANCER--HAD RADIATION - NO CHEMO --DR. CRYSTAL [place] ONCOLOGIST   BREAST SURGERY Right 10/2012   Wide excision,APB RT 10 mm papillary DCIS, ER/PR positive. Sentinel node negative. Partial breast radiation.   CATARACT EXTRACTION, BILATERAL  02/13/2016   [facility].   CHOLECYSTECTOMY  1994   COLONOSCOPY  2015   1 benign polyp-every 5 years/ Dr Bluford Kaufmann   COLONOSCOPY WITH PROPOFOL N/A 02/10/2021   Procedure: COLONOSCOPY WITH PROPOFOL;  Surgeon: Earline Mayotte, MD;  Location: [facility] ENDOSCOPY;  Service: Endoscopy;  Laterality: N/A;   ERCP  1995   JOINT REPLACEMENT Right 04/2013   knee   PERICARDIOCENTESIS N/A 11/13/2017   Procedure: PERICARDIOCENTESIS;  Surgeon: Yvonne Kendall, MD;  Location: [facility] INVASIVE CV LAB;  Service: Cardiovascular;  Laterality: N/A;   TOTAL KNEE ARTHROPLASTY Right 04/16/2013   Procedure: RIGHT TOTAL KNEE ARTHROPLASTY;  Surgeon: Shelda Pal, MD;  Location: [facility] ORS;  Service: Orthopedics;  Laterality: Right;  TOTAL KNEE ARTHROPLASTY Left 09/15/2014   Procedure: LEFT TOTAL KNEE ARTHROPLASTY;  Surgeon: Shelda Pal, MD;  Location: [facility] ORS;  Service: Orthopedics;  Laterality: Left;   TUBAL LIGATION  1979   UPPER GI ENDOSCOPY     Patient Active Problem List   Diagnosis Date Noted   Cerebral vascular disease 10/10/2022   Gait abnormality 07/11/2022   Pressure injury of skin 06/05/2022   Rib fracture 06/04/2022   Stage 3a chronic kidney disease (CKD) (HCC) 06/04/2022   Skin ulcer with fat layer exposed (HCC) 03/16/2022   Vitamin B 12 deficiency 01/27/2022   Malaise 01/27/2022   Fever and chills 01/27/2022   Wheezing 01/27/2022   Vitamin D deficiency 01/27/2022   Hallucinations  11/24/2021   Abdominal pain 11/12/2021   Falls frequently 03/24/2021   Difficulty walking 04/04/2020   Hypophonia 01/15/2020   Numbness 12/28/2019   Left arm weakness 12/09/2019   Tremor 08/26/2019   Parkinsonism 07/29/2019   DDD (degenerative disc disease), lumbar 10/19/2018   Preventative health care 10/19/2018   Prediabetes 10/19/2018   (HFpEF) heart failure with preserved ejection fraction (HCC) 12/12/2017   Pericardial effusion with cardiac tamponade 11/13/2017   Paroxysmal atrial fibrillation (HCC) 11/05/2017   Acute cough 05/30/2017   Anxiety 03/21/2016   Insomnia 07/17/2015   MI (mitral incompetence) 12/10/2014   Major depressive disorder 12/09/2014   OSA (obstructive sleep apnea) 12/09/2014   GERD (gastroesophageal reflux disease) 12/09/2014   Chronic diastolic CHF (congestive heart failure) (HCC)    HLD (hyperlipidemia)    Osteoarthritis of both knees    S/P left TKA 08/19/2014   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 (BMI) 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 (HCC) 04/17/2013   History of ductal carcinoma in situ (DCIS) of breast 11/05/2012   Essential hypertension, benign 11/05/2012    ONSET DATE: 04/06/2023 (date of referral)  REFERRING DIAG: Z86.73 (ICD-10-CM) - History of CVA  THERAPY DIAG:  Other lack of coordination  Muscle weakness (generalized)  Other symptoms and signs involving the musculoskeletal system  Other symptoms and signs involving the nervous system  Rationale for Evaluation and Treatment: Rehabilitation  SUBJECTIVE:   SUBJECTIVE STATEMENT: PACE for 1 year. Had most recent stroke before her most recent MRI (01/13/2023) with multiple CVAs before that. She feels that she started having decline after PD dx and then that decline continued as she had recurrent strokes. She did not know that she had her most recent stroke until after her MRI.   Difficulty holding and using utensils. Difficulty using phone.   Pt accompanied by:  self  PERTINENT HISTORY: PD, CVA, Depression, arthritis, GERD, CA, CHF, HLD, HTN   PRECAUTIONS: Fall  WEIGHT BEARING RESTRICTIONS: No  PAIN:  Are you having pain? Yes: NPRS scale: 0/10 Pain location: neck Pain description: hurts Aggravating factors: posture Relieving factors: Biofreeze Typically 4-5/10 pain in neck but isn't hurting her at time of evaluation.   FALLS: Has patient fallen in last 6 months? Yes. Number of falls fell from her stroke  LIVING ENVIRONMENT: Lives with:  caregiver Lives in: House/apartment Stairs:  ramped entrance Has following equipment at home: Single point cane, Walker - 2 wheeled, Environmental consultant - 4 wheeled, Wheelchair (manual), shower chair, bed side commode, Ramped entry, and [employer] lift   PLOF: Needs assistance with ADLs, Needs assistance with homemaking, Needs assistance with gait, and Needs assistance with transfers  PATIENT GOALS: improve use of BUEs  OBJECTIVE:   HAND DOMINANCE: Right  ADLs: Overall ADLs: dependent  IADLs:  Overall IADLs: dependent  MOBILITY STATUS:  wheelchair bound  POSTURE COMMENTS:  rounded shoulders, forward head, increased thoracic kyphosis, right pelvic obliquity, flexed trunk , and weight shift right   ACTIVITY TOLERANCE: Activity tolerance: fair  FUNCTIONAL OUTCOME MEASURES: PSFS: 0   Total score = sum of the activity scores/number of activities Minimum detectable change (90%CI) for average score = 2 points Minimum detectable change (90%CI) for single activity score = 3 points   UPPER EXTREMITY ROM:     AROM Right (eval) Left (eval)  Shoulder flexion 80 45  Shoulder abduction 45 45  Elbow flexion WNL WFL  Elbow extension -40 -30  Wrist flexion 60 62  Wrist extension 30 38  Wrist pronation WNL WNL  Wrist supination To neutral 10  Digit Composite Flexion Lacks 2 cm 2nd digit Lacks 1 cm 2nd digit  Digit Composite Extension WFL WNL  Digit Opposition WFL WFL  (Blank rows = not tested)  UPPER  EXTREMITY MMT:     MMT Right (eval) Left (eval)  Shoulder flexion BFL BFL  Shoulder abduction BFL BFL  Elbow flexion [facility] WFL  Elbow extension WFL WFL  (Blank rows = not tested) Lacks endurance with testing.   HAND FUNCTION: Grip strength: Right: 21.8 lbs; Left: 25.5 lbs  COORDINATION: Box and Blocks:  Right 3blocks, Left 5blocks  SENSATION: WFL  EDEMA: none reported or observed  MUSCLE TONE: RUE: Moderate and Rigidity and LUE: Moderate and Rigidity  COGNITION: Overall cognitive status: Within functional limits for tasks assessed  VISION: To be assessed during future visit  PERCEPTION: Not tested  PRAXIS: Not tested  OBSERVATIONS: Pt requires assistance to propel wheelchair. Poor positioning within wheelchair pushing BUE into shoulder protraction, elbow flexion, shoulder adduction, and wrist flexion. Unable to look pt directly in the eyes given kyphotic and forward head posturing. Appears well kept. Glasses donned. Pt able to hold small sandwich in her R hand upon leaving clinic.   TODAY'S TREATMENT:                                                                                                                               N/A this date  PATIENT EDUCATION: Education details: OT Role and POC Person educated: Patient Education method: Explanation Education comprehension: verbalized understanding  HOME EXERCISE PROGRAM: N/A This date  GOALS:  SHORT TERM GOALS: Target date: 05/31/2023    Patient will demonstrate independence with initial BUE ROM HEP. Baseline: not yet initiated Goal status: INITIAL  2.  Pt will be able to place at least 8 blocks using right hand with completion of Box and Blocks test.  Baseline: Right 3 blocks Goal status: INITIAL  3.  Pt will be able to place at least 10 blocks using left hand with completion of Box and Blocks test. Baseline: Left 5 blocks Goal status: INITIAL  4.  Pt will independently recall at least 3 positioning and  pressure relief strategies as needed to prevent  pressure injuries and contractures.  Baseline:  Goal status: INITIAL  5.  Pt will demonstrate full composite flexion bilaterally as needed to pick up and hold ADL items such as hair brush, tooth brush, eating utensils, etc.  Baseline:  Right  Left Lacks 2 cm 2nd digit Lacks 1 cm 2nd digit  Goal status: INITIAL  LONG TERM GOALS: Target date: 06/28/2023    Patient will demonstrate updated RUE and LUE HEP with 25% verbal cues or less for proper execution. Baseline: not yet initiated Goal status: INITIAL  2.  Pt will verbalize understanding of ways to prevent future PD related complications. Baseline: not yet initiated Goal status: INITIAL  3.  Pt will be able to place at least 12 blocks using right hand with completion of Box and Blocks test.  Baseline: Right 3 blocks Goal status: INITIAL  4.  Pt will be able to place at least 14 blocks using left hand with completion of Box and Blocks test. Baseline: Left 5 blocks Goal status: INITIAL  5.  Patient will report at least two-point increase in average PSFS score or at least three-point increase in a single activity score indicating functionally significant improvement given minimum detectable change.  Baseline: 0 total score (See above for individual activity scores)  Baseline:  Goal status: INITIAL  ASSESSMENT:  CLINICAL IMPRESSION: Patient is a 77 y.o. female who was seen today for occupational therapy evaluation for CVA. Hx includes PD (dx in 2021), B cataract removal, BTKA, anxiety, A-fib, CHF, DDD, depression, HLD, HTN, and breast cancer. Patient currently presents below baseline level of functioning demonstrating functional deficits and impairments as noted below. Pt would benefit from skilled OT services in the outpatient setting to work on impairments as noted below to help pt return to PLOF as able. Patient is also a potential candidate for Vivistim given functional level and  type of stroke. Patient is limited by her wheelchair as it puts her at a high risk of pressure ulcers, frozen or subluxed shoulders, and B elbow and wrist contractures.   PERFORMANCE DEFICITS: in functional skills including ADLs, IADLs, coordination, tone, ROM, strength, Fine motor control, Gross motor control, mobility, body mechanics, decreased knowledge of use of DME, skin integrity, and UE functional use.   IMPAIRMENTS: are limiting patient from ADLs, IADLs, leisure, and social participation.   CO-MORBIDITIES: may have co-morbidities  that affects occupational performance. Patient will benefit from skilled OT to address above impairments and improve overall function.  MODIFICATION OR ASSISTANCE TO COMPLETE EVALUATION: Maximum or significant modification of tasks or assist is necessary to complete an evaluation.  OT OCCUPATIONAL PROFILE AND HISTORY: Detailed assessment: Review of records and additional review of physical, cognitive, psychosocial history related to current functional performance.  CLINICAL DECISION MAKING: Moderate - several treatment options, min-mod task modification necessary  REHAB POTENTIAL: Fair given chronicity of poor posturing and   EVALUATION COMPLEXITY: Moderate    PLAN:  OT FREQUENCY: 2x/week  OT DURATION: 8 weeks  PLANNED INTERVENTIONS: self care/ADL training, therapeutic exercise, therapeutic activity, neuromuscular re-education, manual therapy, passive range of motion, functional mobility training, splinting, electrical stimulation, ultrasound, fluidotherapy, moist heat, patient/family education, visual/perceptual remediation/compensation, energy conservation, coping strategies training, DME and/or AE instructions, and Re-evaluation  RECOMMENDED OTHER SERVICES: none at this time. Pt is in process of obtaining w/c eval.   CONSULTED AND AGREED WITH PLAN OF CARE: Patient  PLAN FOR NEXT SESSION: review/establish HEP; does pt have NMES?   Delana Meyer, OT 05/03/2023, 5:12 PM

## 2023-05-03 NOTE — Patient Instructions (Signed)
   Biotene dry mouth spray  Biotene dry mouth gel (for night)  Bring your phone to therapy  Bring your email address  Do the lesson 1 twice a day  Parkinson's voice project website  Watch the Parkinson's video "What is Parkinson's?" On the Parkinson's Voice Project Website

## 2023-05-03 NOTE — Therapy (Signed)
[facility] [address] [place], [place], [address] Phone: [phone]   Fax:  [phone]  Patient Details  Name: Selena Bush MRN: [id number] Date of Birth: 1946-04-28 Referring Provider:  No ref. provider found  Encounter Date: 05/02/2023  Consuella Lose from [place] called to speak to therapist regarding "keeping the bookings for [name]" at this clinic. This therapist confirmed that pt's appointments were still scheduled and was acknowledged by Consuella Lose prior to her hanging up phone.    [name], PT 05/03/2023, 10:07 AM  [facility] [address] [place], [place], [address] Phone: [phone]   Fax:  [phone]

## 2023-05-03 NOTE — Therapy (Signed)
OUTPATIENT SPEECH LANGUAGE PATHOLOGY [facility]   Patient Name: Selena Bush MRN: [id number] DOB:October 23, 1945, 77 y.o., female Today's Date: 05/03/2023  PCP: Selena Bastos, MD REFERRING PROVIDER: Alver Sorrow, NP  END OF SESSION:  End of Session - 05/03/23 1504     Visit Number 1    Number of Visits 25    Date for SLP Re-Evaluation 07/26/23    SLP Start Time 1400    SLP Stop Time  1445    SLP Time Calculation (min) 45 min    Activity Tolerance Patient tolerated treatment well             Past Medical History:  Diagnosis Date   Anxiety    Atrial fibrillation (HCC)    Cat bite of right hand 03/25/2019   Chronic diastolic CHF (congestive heart failure) (HCC)    a. echo 09/2014: EF 60-65%, diastolic dysfunction, mild LVH, nl RV size & systolic function, mildly dilated LA (4.3 cm), mild MR/TR, mildly elevated PASP 36.7 mm Hg   DDD (degenerative disc disease), cervical    DDD (degenerative disc disease), lumbar    Depression    Diffuse cystic mastopathy 2014   Dyspnea    Dysrhythmia    GERD (gastroesophageal reflux disease)    Gout    Headache    rare   HLD (hyperlipidemia)    a. statin intolerant 2/2 myalgias   Hx of dysplastic nevus 12/25/2018   L medial ankle   Hypercholesterolemia    Hypertension    Malignant neoplasm of upper-outer quadrant of female breast (HCC) 10/2012   Papillary DCIS, sentinel node negative. DR/PR positive. PARTIAL RIGHT MASTECTOMY FOR BREAST CANCER--HAD RADIATION - NO CHEMO --DR. CRYSTAL [place] ONCOLOGIST   Obesity    OSA on CPAP    Osteoarthritis of both knees    a. s/p right TKA 04/2013 & left TKA 09/2014   Otitis externa    Parkinson disease    Personal history of radiation therapy 2015   RIGHT breast-mammosite per pt   Pneumonia of both lungs due to infectious organism 01/08/2021   Sleep difficulties    LUNESTA HAS HELPED   Vaginal cyst    Past Surgical History:  Procedure Laterality Date   ABDOMINAL  HYSTERECTOMY  1992   DUB; fibroids; endometriosis.  One remaining ovary.     BREAST LUMPECTOMY Right 2015   Papillary DCIS, sentinel node negative. DR/PR positive. PARTIAL RIGHT MASTECTOMY FOR BREAST CANCER--HAD RADIATION - NO CHEMO --DR. CRYSTAL [place] ONCOLOGIST   BREAST SURGERY Right 10/2012   Wide excision,APB RT 10 mm papillary DCIS, ER/PR positive. Sentinel node negative. Partial breast radiation.   CATARACT EXTRACTION, BILATERAL  02/13/2016   Selena Bush.   CHOLECYSTECTOMY  1994   COLONOSCOPY  2015   1 benign polyp-every 5 years/ Dr Selena Bush   COLONOSCOPY WITH PROPOFOL N/A 02/10/2021   Procedure: COLONOSCOPY WITH PROPOFOL;  Surgeon: Selena Mayotte, MD;  Location: [facility] ENDOSCOPY;  Service: Endoscopy;  Laterality: N/A;   ERCP  1995   JOINT REPLACEMENT Right 04/2013   knee   PERICARDIOCENTESIS N/A 11/13/2017   Procedure: PERICARDIOCENTESIS;  Surgeon: Selena Kendall, MD;  Location: [facility] INVASIVE CV LAB;  Service: Cardiovascular;  Laterality: N/A;   TOTAL KNEE ARTHROPLASTY Right 04/16/2013   Procedure: RIGHT TOTAL KNEE ARTHROPLASTY;  Surgeon: Selena Pal, MD;  Location: [facility] ORS;  Service: Orthopedics;  Laterality: Right;   TOTAL KNEE ARTHROPLASTY Left 09/15/2014   Procedure: LEFT TOTAL KNEE ARTHROPLASTY;  Surgeon: Selena Pal, MD;  Location: [facility] ORS;  Service: Orthopedics;  Laterality: Left;   TUBAL LIGATION  1979   UPPER GI ENDOSCOPY     Patient Active Problem List   Diagnosis Date Noted   Cerebral vascular disease 10/10/2022   Gait abnormality 07/11/2022   Pressure injury of skin 06/05/2022   Rib fracture 06/04/2022   Stage 3a chronic kidney disease (CKD) (HCC) 06/04/2022   Skin ulcer with fat layer exposed (HCC) 03/16/2022   Vitamin B 12 deficiency 01/27/2022   Malaise 01/27/2022   Fever and chills 01/27/2022   Wheezing 01/27/2022   Vitamin D deficiency 01/27/2022   Hallucinations 11/24/2021   Abdominal pain 11/12/2021   Falls frequently 03/24/2021   Difficulty walking  04/04/2020   Hypophonia 01/15/2020   Numbness 12/28/2019   Left arm weakness 12/09/2019   Tremor 08/26/2019   Parkinsonism 07/29/2019   DDD (degenerative disc disease), lumbar 10/19/2018   Preventative health care 10/19/2018   Prediabetes 10/19/2018   (HFpEF) heart failure with preserved ejection fraction (HCC) 12/12/2017   Pericardial effusion with cardiac tamponade 11/13/2017   Paroxysmal atrial fibrillation (HCC) 11/05/2017   Acute cough 05/30/2017   Anxiety 03/21/2016   Insomnia 07/17/2015   MI (mitral incompetence) 12/10/2014   Major depressive disorder 12/09/2014   OSA (obstructive sleep apnea) 12/09/2014   GERD (gastroesophageal reflux disease) 12/09/2014   Chronic diastolic CHF (congestive heart failure) (HCC)    HLD (hyperlipidemia)    Osteoarthritis of both knees    S/P left TKA 08/19/2014   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 (BMI) 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 (HCC) 04/17/2013   History of ductal carcinoma in situ (DCIS) of breast 11/05/2012   Essential hypertension, benign 11/05/2012    ONSET DATE: 04/06/2023 - referral date; dx with PD 2021; CVA/fall Feb 2024  REFERRING DIAG: Z86.73 (ICD-10-CM) - History of CVA (cerebrovascular accident)  THERAPY DIAG:  Dysarthria and anarthria  Dysphagia, oropharyngeal phase  Rationale for Evaluation and Treatment: Rehabilitation  SUBJECTIVE:   SUBJECTIVE STATEMENT: "I like to talk to people but they tell me they can't understand me" Pt accompanied by: self  PERTINENT HISTORY: [name] was dx with PD in 2021. Referral to Selena Bush after Right LE weakness, fall, CVA revealed weak voice. Apparently she was ambulatory prior to CVA vs fall Feb 2024. She reports vague h/o of ST, however she does not recall what was worked on. At this time, she is dependent feeder and endorses coughing/choking with meals "a few times a week" Per Selena Bush consult: Unfortunately, the patient fell in February, 2024 (pt states had her walker and  just fell to the side, she thinks) and declined significantly after that.  Patient states that she had her walker with her and fell to her side and had to lay on the side for over 30 minutes before EMS came.  Her right leg would not work per patient.  [facility] and her daughter states that they x-rayed the ankle because the right leg was not working. [facility] noted that patient could not walk, and previously had been able to and subsequently sent her to SNF [facility].  She was not evaluated at the hospital.  She had an appt 4 days later with Dr. Terrace Arabia.  Daughter states that they reviewed the MRI done prior to the fall and did a carotid u/s.  No imaging was ordered after the fall.   She could stand when she left SNF but she couldn't walk but she was  able to raise the R leg after she worked for a month and rehab.  However, she can no longer stand now that she is home and seems to have backtracked.     PAIN:  Are you having pain? No  FALLS: Has patient fallen in last 6 months?  Yes, See PT evaluation for details  LIVING ENVIRONMENT: Lives with:  Personal care attendant, [name] Lives in: House/apartment  PLOF:  Level of assistance: Needed assistance with ADLs, Needed assistance with IADLS Employment: Retired  PATIENT GOALS: "To get to speak again the way I used to"  OBJECTIVE:   MRI IMPRESSION: 1. Suspected 5 mm early subacute infarct in the right precentral gyrus. 2. Severe chronic small vessel ischemic disease. New small chronic cortical infarcts in the left frontal and parietal lobes. 3. Progressive sinusitis.    COGNITION: Overall cognitive status: Within functional limits for tasks assessed   MOTOR SPEECH: Overall motor speech: impaired Level of impairment: Phrase Respiration: thoracic breathing Phonation: hoarse, low vocal intensity, and she reports intermittent aphonia Resonance: WFL Articulation: Impaired: sentence Intelligibility: Intelligibility  reduced Motor planning: Appears intact Motor speech errors: unaware Interfering components: premorbid status Effective technique: increased vocal intensity and pause  ORAL MOTOR EXAMINATION: Overall status: Impaired:   Labial: Right (ROM and Coordination) Left (ROM and Coordination) Lingual: Bilateral (ROM and Coordination) Cough: Weak Comments: minimal oral movement, minimal mouth opening to command and straw  OBJECTIVE VOICE ASSESSMENT: Sustained "ah" maximum phonation time: 5 seconds Sustained "ah" loudness average: 68 dB Oral reading sentences loudness average: 62dB Oral reading loudness range: 70-74 dB Conversational loudness average: 63 dB Conversational loudness range: 62-65 dB Voice quality: hoarse, low vocal intensity, vocal fatigue, and aphonic Stimulability trials: Given SLP modeling and occasional mod cues, loudness average increased to 80dB (range of 74 to 82) at (loud "ah",  sentence, ) level.  Comments: She rates 9/10 effort to achieve volumes above  Completed audio recording of patients baseline voice without cueing from SLP: No  Pt does report difficulty with swallowing which does warrant further evaluation.  PATIENT REPORTED OUTCOME MEASURES (PROM): Communication Effectiveness Survey: to be completed 1st sessoin  TODAY'S TREATMENT:                                                                                                                                         DATE:   05/03/23 (eval day): Introduced [employer]! Program, work book and website. Demonstrated website. Completed modified Lesson one (due to time) - Dallys averaged 78dB on warm up; 80dB on loud Ah, 70dB on glides, 70dB on counting. She does not recall her email, but she will bring it with her phone next session.   PATIENT EDUCATION: Education details: Speak Out! Exercise program, compensations for cognition, xerostomia ed, See Today's Treatment, See patient instructions Person educated:  Patient Education method: Explanation, Demonstration, Verbal cues, and Handouts Education comprehension: returned demonstration, verbal cues  required, and needs further education  HOME EXERCISE PROGRAM: Speak Out! Need to order workbook next session when she brings her email   GOALS: Goals reviewed with patient? Yes  SHORT TERM GOALS: Target date: 05/31/23  Complete Communicative Effectiveness Survey first 1-2 visits Baseline: Goal status: INITIAL  2.  Pt will hit target dB on HEP with usual mod A         Baseline:  Goal status: INITIAL  3.  Pt will average 70dB 18/20 sentences with occasional min A Baseline:  Goal status: INITIAL  4.  Pt will complete HEP twice a day consistently with occasional min A Baseline:  Goal status: INITIAL  5.  Pt will average 68 dB over 5 minute simple conversation with occasional min A Baseline:  Goal status: INITIAL  6.  Complete Bedside swallow eval and MBSS if indicated Baseline:  Goal status: INITIAL  LONG TERM GOALS: Target date: 07/26/23  Pt will complete HEP for dysarthria (Speak Out!) twice a day with rare min A Baseline:  Goal status: INITIAL  2.  Pt will average 70dB over 15 minute conversation with rare min A Baseline:  Goal status: INITIAL  3.  Pt will improve score on Communication Effectiveness Survey by 3 points Baseline:  Goal status: INITIAL  4.  Pt will follow swallow precaution and diet modifications pending MBSS if indicated Baseline:  Goal status: INITIAL  5.  Pt will access 2 contacts on her cell phone and emails with usual mod A  Baseline:  Goal status: INITIAL  ASSESSMENT:  CLINICAL IMPRESSION: Patient is a 77 y.o. female who was seen today for moderate hypokinetic dysarthria, oropharyngeal dysphagia, endorses memory change. Lanyia reports decreased social interactions due to not being understood. She is told frequently her volume is too low. She has lost ability to use her phone to check emails, make  calls. She is dependent feeder and today required my A to bring cup and straw to her mouth.  No overt s/s of aspiration with successive straw sips today. Clinical Bedside Eval/MBSS may be warranted. She is fed by her personal attendant. Burgandi endorses coughing/choking several times a week. I recommend skilled ST to maximize intelligibility for safety, independence, participation in social activities and safety of swallow.    OBJECTIVE IMPAIRMENTS: Objective impairments include memory, awareness, dysarthria, and dysphagia. These impairments are limiting patient from effectively communicating at home and in community.Factors affecting potential to achieve goals and functional outcome are medical prognosis and severity of impairments.. Patient will benefit from skilled SLP services to address above impairments and improve overall function.  REHAB POTENTIAL: Good  PLAN:  SLP FREQUENCY: 2x/week  SLP DURATION: 12 weeks  PLANNED INTERVENTIONS: Aspiration precaution training, Pharyngeal strengthening exercises, Diet toleration management , Language facilitation, Environmental controls, Trials of upgraded texture/liquids, Cueing hierachy, Cognitive reorganization, Internal/external aids, Functional tasks, Multimodal communication approach, SLP instruction and feedback, and Compensatory strategies, MBSS    Marcques Wrightsman, Radene Journey, CCC-SLP 05/03/2023, 3:32 PM

## 2023-05-03 NOTE — Telephone Encounter (Signed)
Called and unable to get Enrique Sack , was sent to another VM advised to call if information still needed

## 2023-05-04 ENCOUNTER — Telehealth: Payer: Self-pay

## 2023-05-04 NOTE — Telephone Encounter (Signed)
Patient Name: Selena Bush MRN: [id number] DOB:1946-07-11, 76 y.o., female Today's Date: 05/04/2023  Spoke with Desmond Dike, rehab supervisor from [facility]. They are currently approving previous rehab visits at neuro rehab and requesting PT, OT, SLP eval with POC and future appts to make further determination.    Ileana Ladd, PT 05/04/2023, 12:15 PM

## 2023-05-05 ENCOUNTER — Ambulatory Visit: Payer: Medicare (Managed Care) | Admitting: Physical Therapy

## 2023-05-05 DIAGNOSIS — R262 Difficulty in walking, not elsewhere classified: Secondary | ICD-10-CM

## 2023-05-05 DIAGNOSIS — R2689 Other abnormalities of gait and mobility: Secondary | ICD-10-CM

## 2023-05-05 DIAGNOSIS — R293 Abnormal posture: Secondary | ICD-10-CM | POA: Diagnosis not present

## 2023-05-05 DIAGNOSIS — M6281 Muscle weakness (generalized): Secondary | ICD-10-CM

## 2023-05-05 NOTE — Therapy (Signed)
OUTPATIENT PHYSICAL THERAPY NEURO TREATMENT   Patient Name: Selena Bush MRN: [id number] DOB:[birth year], 77 y.o., female Today's Date: 05/05/2023   PCP: Marny Lowenstein, MD REFERRING PROVIDER: Gillian Shields, NP  END OF SESSION:  PT End of Session - 05/05/23 1237     Visit Number 5    Number of Visits 17    Date for PT Re-Evaluation 06/16/23    Authorization Type PACE    PT Start Time 1230    PT Stop Time 1320    PT Time Calculation (min) 50 min    Equipment Utilized During Treatment Gait belt    Activity Tolerance Patient tolerated treatment well    Behavior During Therapy WFL for tasks assessed/performed;Flat affect               Past Medical History:  Diagnosis Date   Anxiety    Atrial fibrillation (HCC)    Cat bite of right hand 03/25/2019   Chronic diastolic CHF (congestive heart failure) (HCC)    a. echo 09/2014: EF 60-65%, diastolic dysfunction, mild LVH, nl RV size & systolic function, mildly dilated LA (4.3 cm), mild MR/TR, mildly elevated PASP 36.7 mm Hg   DDD (degenerative disc disease), cervical    DDD (degenerative disc disease), lumbar    Depression    Diffuse cystic mastopathy 2014   Dyspnea    Dysrhythmia    GERD (gastroesophageal reflux disease)    Gout    Headache    rare   HLD (hyperlipidemia)    a. statin intolerant 2/2 myalgias   Hx of dysplastic nevus 12/25/2018   L medial ankle   Hypercholesterolemia    Hypertension    Malignant neoplasm of upper-outer quadrant of female breast (HCC) 10/2012   Papillary DCIS, sentinel node negative. DR/PR positive. PARTIAL RIGHT MASTECTOMY FOR BREAST CANCER--HAD RADIATION - NO CHEMO --DR. CRYSTAL [place] ONCOLOGIST   Obesity    OSA on CPAP    Osteoarthritis of both knees    a. s/p right TKA 04/2013 & left TKA 09/2014   Otitis externa    Parkinson disease    Personal history of radiation therapy 2015   RIGHT breast-mammosite per pt   Pneumonia of both lungs due to infectious organism 01/08/2021    Sleep difficulties    LUNESTA HAS HELPED   Vaginal cyst    Past Surgical History:  Procedure Laterality Date   ABDOMINAL HYSTERECTOMY  1992   DUB; fibroids; endometriosis.  One remaining ovary.     BREAST LUMPECTOMY Right 2015   Papillary DCIS, sentinel node negative. DR/PR positive. PARTIAL RIGHT MASTECTOMY FOR BREAST CANCER--HAD RADIATION - NO CHEMO --DR. CRYSTAL [employer] [place] ONCOLOGIST   BREAST SURGERY Right 10/2012   Wide excision,APB RT 10 mm papillary DCIS, ER/PR positive. Sentinel node negative. Partial breast radiation.   CATARACT EXTRACTION, BILATERAL  02/13/2016   [facility].   CHOLECYSTECTOMY  1994   COLONOSCOPY  2015   1 benign polyp-every 5 years/ Dr Bluford Kaufmann   COLONOSCOPY WITH PROPOFOL N/A 02/10/2021   Procedure: COLONOSCOPY WITH PROPOFOL;  Surgeon: Earline Mayotte, MD;  Location: [facility] END[facility]COPY;  Service: Endoscopy;  Laterality: N/A;   ERCP  1995   JOINT REPLACEMENT Right 04/2013   knee   PERICARDIOCENTESIS N/A 11/13/2017   Procedure: PERICARDIOCENTESIS;  Surgeon: Yvonne Kendall, MD;  Location: [facility] INVASIVE CV LAB;  Service: Cardiovascular;  Laterality: N/A;   TOTAL KNEE ARTHROPLASTY Right 04/16/2013   Procedure: RIGHT TOTAL KNEE ARTHROPLASTY;  Surgeon: Shelda Pal, MD;  Location: [facility] ORS;  Service: Orthopedics;  Laterality: Right;   TOTAL KNEE ARTHROPLASTY Left 09/15/2014   Procedure: LEFT TOTAL KNEE ARTHROPLASTY;  Surgeon: Shelda Pal, MD;  Location: [facility] ORS;  Service: Orthopedics;  Laterality: Left;   TUBAL LIGATION  1979   UPPER GI ENDOSCOPY     Patient Active Problem List   Diagnosis Date Noted   Cerebral vascular disease 10/10/2022   Gait abnormality 07/11/2022   Pressure injury of skin 06/05/2022   Rib fracture 06/04/2022   Stage 3a chronic kidney disease (CKD) (HCC) 06/04/2022   Skin ulcer with fat layer exposed (HCC) 03/16/2022   Vitamin B 12 deficiency 01/27/2022   Malaise 01/27/2022   Fever and chills 01/27/2022   Wheezing 01/27/2022   Vitamin D  deficiency 01/27/2022   Hallucinations 11/24/2021   Abdominal pain 11/12/2021   Falls frequently 03/24/2021   Difficulty walking 04/04/2020   Hypophonia 01/15/2020   Numbness 12/28/2019   Left arm weakness 12/09/2019   Tremor 08/26/2019   Parkinsonism 07/29/2019   DDD (degenerative disc disease), lumbar 10/19/2018   Preventative health care 10/19/2018   Prediabetes 10/19/2018   (HFpEF) heart failure with preserved ejection fraction (HCC) 12/12/2017   Pericardial effusion with cardiac tamponade 11/13/2017   Paroxysmal atrial fibrillation (HCC) 11/05/2017   Acute cough 05/30/2017   Anxiety 03/21/2016   Insomnia 07/17/2015   MI (mitral incompetence) 12/10/2014   Major depressive disorder 12/09/2014   OSA (obstructive sleep apnea) 12/09/2014   GERD (gastroesophageal reflux disease) 12/09/2014   Chronic diastolic CHF (congestive heart failure) (HCC)    HLD (hyperlipidemia)    Osteoarthritis of both knees    S/P left TKA 08/19/2014   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 (BMI) 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 (HCC) 04/17/2013   History of ductal carcinoma in situ (DCIS) of breast 11/05/2012   Essential hypertension, benign 11/05/2012    ONSET DATE: 02/14/2023 referral  REFERRING DIAG: Z86.73 (ICD-10-CM) - History of CVA (cerebrovascular accident) R53.81 (ICD-10-CM) - Physical deconditioning  THERAPY DIAG:  Muscle weakness (generalized)  Abnormal posture  Other abnormalities of gait and mobility  Difficulty in walking, not elsewhere classified  Rationale for Evaluation and Treatment: Rehabilitation  SUBJECTIVE:                                                                                                                                                                                             SUBJECTIVE STATEMENT: Pt has had a few episodes of the cramping in her neck (R and L side) since last visit. Pt was VERY excited about walking in // bars last session, told  everyone about it. Pt reports  that her pain is 1/10 today in her neck.   Pt accompanied by: self   PERTINENT HISTORY: PD, CVA, Depression, arthritis, GERD, CA, CHF, HLD, HTN  PAIN:  Are you having pain? No and Yes: NPRS scale: 1/10 Pain location: Neck  Pain description: Achy, spasm   PRECAUTIONS: Fall and Other: R hemi  PATIENT GOALS: "I want to walk again"   OBJECTIVE:   DIAGNOSTIC FINDINGS: 01/13/23 MRI Brain IMPRESSION: 1. Suspected 5 mm early subacute infarct in the right precentral gyrus. 2. Severe chronic small vessel ischemic disease. New small chronic cortical infarcts in the left frontal and parietal lobes. 3. Progressive sinusitis.   TODAY'S TREATMENT:        NMR To work on LE WB and pre-gait:        Sit to stand in stedy with max A x 2 Pt unable to maintain anterior weight shift or upright posture, retropulsive Sit to stand at ballet bar with max A 2, B knees blocked Pt exhibits improved anterior weight shift as compared to standing with stedy but still remains retropulsive Sit to stand in // bars with max to total A Pt again with difficulty performing anterior weight shift but improved as compared to previous two trials this session Unable to safely take steps in // bars as pt unable to weight shift forwards enough  TherAct Trigger Point Dry-Needling  Treatment instructions: Expect mild to moderate muscle soreness. S/S of pneumothorax if dry needled over a lung field, and to seek immediate medical attention should they occur. Patient verbalized understanding of these instructions and education.  Patient Consent Given: Yes Education handout provided: Previously provided Muscles treated: L and R upper traps Treatment response/outcome: deep ache/muscle cramp; muscle twitch detected   PATIENT EDUCATION: Education details: continue HEP Person educated: Patient Education method: Explanation and Handouts Education comprehension: verbalized understanding and  needs further education  HOME EXERCISE PROGRAM: Access Code: [email or web address] URL: [email or web address] Date: 04/26/2023 Prepared by: Merry Lofty  Exercises - Supine Gluteal Sets  - 1 x daily - 7 x weekly - 3 sets - 10 reps - Supine Hip Abduction  - 1 x daily - 7 x weekly - 3 sets - 10 reps - Supine Heel Slide  - 1 x daily - 7 x weekly - 3 sets - 10 reps - Supine Ankle Pumps  - 1 x daily - 7 x weekly - 3 sets - 10 reps - Seated Scapular Retraction  - 1 x daily - 7 x weekly - 3 sets - 10 reps - Supine Hamstring Stretch with Caregiver  - 1 x daily - 7 x weekly - 3 sets - 30s hold - Seated Hamstring Stretch with Chair  - 1 x daily - 7 x weekly - 3 sets - 30s hold  GOALS: Goals reviewed with patient? Yes  SHORT TERM GOALS: Target date: 05/05/23  Pt will be independent with initial HEP for improved functional strength Baseline: to be provided Goal status: MET  2.  Patient will complete sit <> stand with LRAD and MaxA x1  Baseline: Total A, max A to total A in // bars Goal status: IN PROGRESS  3.  Patient will initiate gait training Baseline: non-ambulatory; initiated 9/18  Goal status: MET  4.  Patient will maintain static sitting balance at EOB for at least 30s with no more than ModA Baseline: to be assessed; CGA-min A (9/13) Goal status: MET   LONG TERM GOALS: Target date: 06/16/23  Pt will be independent with final  HEP for improved functional strength Baseline: to be provided Goal status: INITIAL  Patient will complete sit <> stand with LRAD and Mod x1  Baseline: Total A Goal status: INITIAL  Patient will ambulate at least 76ft in // bars with no more than ModA + wc follow as necessary Baseline: MaxA x82ft + wc follow Goal status: REVISED   Patient will maintain static sitting balance at EOB for at least 60s with no more than MinA Baseline: to be assessed; CGA-min A (9/13) Goal status: MET  ASSESSMENT:  CLINICAL IMPRESSION: Emphasis of skilled PT  session on performing TPDN to address ongoing pain/tightness in shoulders as well as working on standing with various devices. Pt exhibits the least amount of physical assistance to stand in // bars this date, however due to availability of equipment she did not stand with this device until end of session when she was already fatigued from having stood several times. Pt has met 3/4 LTG due to being independent with her initial HEP, initiating gait training, and being able to maintain sitting balance. She has been able to perform sit to stand with anywhere from max to total A, making progress towards goal of being able to stand with max A. Pt continues to benefit from skilled therapy services to work towards increased safety and independence with functional mobility. Continue POC.   OBJECTIVE IMPAIRMENTS: cardiopulmonary status limiting activity, decreased activity tolerance, decreased balance, decreased coordination, decreased endurance, decreased knowledge of condition, decreased knowledge of use of DME, decreased mobility, difficulty walking, decreased ROM, decreased strength, impaired perceived functional ability, impaired sensation, impaired tone, improper body mechanics, and postural dysfunction.   ACTIVITY LIMITATIONS: carrying, lifting, bending, sitting, standing, squatting, stairs, transfers, bed mobility, continence, bathing, toileting, dressing, self feeding, reach over head, hygiene/grooming, and locomotion level  PARTICIPATION LIMITATIONS: meal prep, cleaning, laundry, medication management, interpersonal relationship, shopping, and community activity  PERSONAL FACTORS: Age, Past/current experiences, Time since onset of injury/illness/exacerbation, Transportation, and 3+ comorbidities: see above  are also affecting patient's functional outcome.   REHAB POTENTIAL: Fair time since onset  CLINICAL DECISION MAKING: Evolving/moderate complexity  EVALUATION COMPLEXITY: Moderate  PLAN:  PT  FREQUENCY: 2x/week  PT DURATION: 8 weeks  PLANNED INTERVENTIONS: Therapeutic exercises, Therapeutic activity, Neuromuscular re-education, Balance training, Gait training, Patient/Family education, Self Care, Joint mobilization, Stair training, Vestibular training, Visual/preceptual remediation/compensation, Orthotic/Fit training, DME instructions, Aquatic Therapy, Dry Needling, Manual therapy, and Re-evaluation  PLAN FOR NEXT SESSION: sitting balance, STS, transfers, gait in // bars, may ultimately need custom wc    Peter Congo, PT, DPT, [facility] [address] [place], [place]  [address] Phone:  [phone] Fax:  [phone]     05/05/2023, 1:21 PM

## 2023-05-05 NOTE — Addendum Note (Signed)
Addended by: Josephine Igo on: 05/05/2023 03:24 PM   Modules accepted: Orders

## 2023-05-09 ENCOUNTER — Telehealth: Payer: Self-pay | Admitting: Neurology

## 2023-05-09 NOTE — Telephone Encounter (Signed)
Left message with the after hour service    Needs clarification about medication order did not leave the name of the medication

## 2023-05-09 NOTE — Telephone Encounter (Signed)
Called [facility] back with the instructions for the Rytary 145 Take 3 capsules at 7/11/4 and take 1 capsule at bed

## 2023-05-10 ENCOUNTER — Ambulatory Visit: Payer: Medicare (Managed Care)

## 2023-05-10 ENCOUNTER — Encounter: Payer: Self-pay | Admitting: *Deleted

## 2023-05-10 DIAGNOSIS — R293 Abnormal posture: Secondary | ICD-10-CM | POA: Diagnosis not present

## 2023-05-10 DIAGNOSIS — R262 Difficulty in walking, not elsewhere classified: Secondary | ICD-10-CM

## 2023-05-10 DIAGNOSIS — R278 Other lack of coordination: Secondary | ICD-10-CM

## 2023-05-10 DIAGNOSIS — R2689 Other abnormalities of gait and mobility: Secondary | ICD-10-CM

## 2023-05-10 DIAGNOSIS — M6281 Muscle weakness (generalized): Secondary | ICD-10-CM

## 2023-05-10 NOTE — Therapy (Signed)
OUTPATIENT PHYSICAL THERAPY NEURO TREATMENT   Patient Name: Selena Bush MRN: [id number] DOB:August 27, 1945, 77 y.o., female Today's Date: 05/10/2023   PCP: Marny Lowenstein, MD REFERRING PROVIDER: Gillian Shields, NP  END OF SESSION:  PT End of Session - 05/10/23 1319     Visit Number 6    Number of Visits 17    Date for PT Re-Evaluation 06/16/23    Authorization Type PACE    PT Start Time 1317    PT Stop Time 1400    PT Time Calculation (min) 43 min    Equipment Utilized During Treatment Gait belt    Activity Tolerance Patient tolerated treatment well    Behavior During Therapy WFL for tasks assessed/performed;Flat affect               Past Medical History:  Diagnosis Date   Anxiety    Atrial fibrillation (HCC)    Cat bite of right hand 03/25/2019   Chronic diastolic CHF (congestive heart failure) (HCC)    a. echo 09/2014: EF 60-65%, diastolic dysfunction, mild LVH, nl RV size & systolic function, mildly dilated LA (4.3 cm), mild MR/TR, mildly elevated PASP 36.7 mm Hg   DDD (degenerative disc disease), cervical    DDD (degenerative disc disease), lumbar    Depression    Diffuse cystic mastopathy 2014   Dyspnea    Dysrhythmia    GERD (gastroesophageal reflux disease)    Gout    Headache    rare   HLD (hyperlipidemia)    a. statin intolerant 2/2 myalgias   Hx of dysplastic nevus 12/25/2018   L medial ankle   Hypercholesterolemia    Hypertension    Malignant neoplasm of upper-outer quadrant of female breast (HCC) 10/2012   Papillary DCIS, sentinel node negative. DR/PR positive. PARTIAL RIGHT MASTECTOMY FOR BREAST CANCER--HAD RADIATION - NO CHEMO --DR. CRYSTAL [place] ONCOLOGIST   Obesity    OSA on CPAP    Osteoarthritis of both knees    a. s/p right TKA 04/2013 & left TKA 09/2014   Otitis externa    Parkinson disease    Personal history of radiation therapy 2015   RIGHT breast-mammosite per pt   Pneumonia of both lungs due to infectious organism 01/08/2021    Sleep difficulties    LUNESTA HAS HELPED   Vaginal cyst    Past Surgical History:  Procedure Laterality Date   ABDOMINAL HYSTERECTOMY  1992   DUB; fibroids; endometriosis.  One remaining ovary.     BREAST LUMPECTOMY Right 2015   Papillary DCIS, sentinel node negative. DR/PR positive. PARTIAL RIGHT MASTECTOMY FOR BREAST CANCER--HAD RADIATION - NO CHEMO --DR. CRYSTAL [place] ONCOLOGIST   BREAST SURGERY Right 10/2012   Wide excision,APB RT 10 mm papillary DCIS, ER/PR positive. Sentinel node negative. Partial breast radiation.   CATARACT EXTRACTION, BILATERAL  02/13/2016   [facility]avis.   CHOLECYSTECTOMY  1994   COLONOSCOPY  2015   1 benign polyp-every 5 years/ Dr Bluford Kaufmann   COLONOSCOPY WITH PROPOFOL N/A 02/10/2021   Procedure: COLONOSCOPY WITH PROPOFOL;  Surgeon: Earline Mayotte, MD;  Location: [facility] ENDOSCOPY;  Service: Endoscopy;  Laterality: N/A;   ERCP  1995   JOINT REPLACEMENT Right 04/2013   knee   PERICARDIOCENTESIS N/A 11/13/2017   Procedure: PERICARDIOCENTESIS;  Surgeon: Yvonne Kendall, MD;  Location: [facility] INVASIVE CV LAB;  Service: Cardiovascular;  Laterality: N/A;   TOTAL KNEE ARTHROPLASTY Right 04/16/2013   Procedure: RIGHT TOTAL KNEE ARTHROPLASTY;  Surgeon: Shelda Pal, MD;  Location: [facility] ORS;  Service: Orthopedics;  Laterality: Right;   TOTAL KNEE ARTHROPLASTY Left 09/15/2014   Procedure: LEFT TOTAL KNEE ARTHROPLASTY;  Surgeon: Shelda Pal, MD;  Location: [facility] ORS;  Service: Orthopedics;  Laterality: Left;   TUBAL LIGATION  1979   UPPER GI ENDOSCOPY     Patient Active Problem List   Diagnosis Date Noted   Cerebral vascular disease 10/10/2022   Gait abnormality 07/11/2022   Pressure injury of skin 06/05/2022   Rib fracture 06/04/2022   Stage 3a chronic kidney disease (CKD) (HCC) 06/04/2022   Skin ulcer with fat layer exposed (HCC) 03/16/2022   Vitamin B 12 deficiency 01/27/2022   Malaise 01/27/2022   Fever and chills 01/27/2022   Wheezing 01/27/2022   Vitamin D  deficiency 01/27/2022   Hallucinations 11/24/2021   Abdominal pain 11/12/2021   Falls frequently 03/24/2021   Difficulty walking 04/04/2020   Hypophonia 01/15/2020   Numbness 12/28/2019   Left arm weakness 12/09/2019   Tremor 08/26/2019   Parkinsonism 07/29/2019   DDD (degenerative disc disease), lumbar 10/19/2018   Preventative health care 10/19/2018   Prediabetes 10/19/2018   (HFpEF) heart failure with preserved ejection fraction (HCC) 12/12/2017   Pericardial effusion with cardiac tamponade 11/13/2017   Paroxysmal atrial fibrillation (HCC) 11/05/2017   Acute cough 05/30/2017   Anxiety 03/21/2016   Insomnia 07/17/2015   MI (mitral incompetence) 12/10/2014   Major depressive disorder 12/09/2014   OSA (obstructive sleep apnea) 12/09/2014   GERD (gastroesophageal reflux disease) 12/09/2014   Chronic diastolic CHF (congestive heart failure) (HCC)    HLD (hyperlipidemia)    Osteoarthritis of both knees    S/P left TKA 08/19/2014   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 (BMI) 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 (HCC) 04/17/2013   History of ductal carcinoma in situ (DCIS) of breast 11/05/2012   Essential hypertension, benign 11/05/2012    ONSET DATE: 02/14/2023 referral  REFERRING DIAG: Z86.73 (ICD-10-CM) - History of CVA (cerebrovascular accident) R53.81 (ICD-10-CM) - Physical deconditioning  THERAPY DIAG:  Muscle weakness (generalized)  Abnormal posture  Other abnormalities of gait and mobility  Difficulty in walking, not elsewhere classified  Other lack of coordination  Rationale for Evaluation and Treatment: Rehabilitation  SUBJECTIVE:                                                                                                                                                                                             SUBJECTIVE STATEMENT: Patient reports doing well, excited to try walking again. Does have a significant bruise in L lateral distal LE and reports  its from transferring with the standing frame with [facility]. Denies  falls.    Pt accompanied by: self   PERTINENT HISTORY: PD, CVA, Depression, arthritis, GERD, CA, CHF, HLD, HTN  PAIN:  Are you having pain? No and Yes: NPRS scale: 1/10 Pain location: Neck  Pain description: Achy, spasm   PRECAUTIONS: Fall and Other: R hemi  PATIENT GOALS: "I want to walk again"   OBJECTIVE:   DIAGNOSTIC FINDINGS: 01/13/23 MRI Brain IMPRESSION: 1. Suspected 5 mm early subacute infarct in the right precentral gyrus. 2. Severe chronic small vessel ischemic disease. New small chronic cortical infarcts in the left frontal and parietal lobes. 3. Progressive sinusitis.   TODAY'S TREATMENT:         PATIENT EDUCATION: Education details: continue HEP Person educated: Patient Education method: Explanation and Handouts Education comprehension: verbalized understanding and needs further education  HOME EXERCISE PROGRAM: Access Code: [email or web address] URL: [email or web address] Date: 04/26/2023 Prepared by: Merry Lofty  Exercises - Supine Gluteal Sets  - 1 x daily - 7 x weekly - 3 sets - 10 reps - Supine Hip Abduction  - 1 x daily - 7 x weekly - 3 sets - 10 reps - Supine Heel Slide  - 1 x daily - 7 x weekly - 3 sets - 10 reps - Supine Ankle Pumps  - 1 x daily - 7 x weekly - 3 sets - 10 reps - Seated Scapular Retraction  - 1 x daily - 7 x weekly - 3 sets - 10 reps - Supine Hamstring Stretch with Caregiver  - 1 x daily - 7 x weekly - 3 sets - 30s hold - Seated Hamstring Stretch with Chair  - 1 x daily - 7 x weekly - 3 sets - 30s hold  GOALS: Goals reviewed with patient? Yes  SHORT TERM GOALS: Target date: 05/05/23  Pt will be independent with initial HEP for improved functional strength Baseline: to be provided Goal status: MET  2.  Patient will complete sit <> stand with LRAD and MaxA x1  Baseline: Total A, max A to total A in // bars Goal status: IN PROGRESS  3.  Patient will  initiate gait training Baseline: non-ambulatory; initiated 9/18  Goal status: MET  4.  Patient will maintain static sitting balance at EOB for at least 30s with no more than ModA Baseline: to be assessed; CGA-min A (9/13) Goal status: MET   LONG TERM GOALS: Target date: 06/16/23  Pt will be independent with final HEP for improved functional strength Baseline: to be provided Goal status: INITIAL  Patient will complete sit <> stand with LRAD and Mod x1  Baseline: Total A Goal status: INITIAL  Patient will ambulate at least 80ft in // bars with no more than ModA + wc follow as necessary Baseline: MaxA x47ft + wc follow Goal status: REVISED   Patient will maintain static sitting balance at EOB for at least 60s with no more than MinA Baseline: to be assessed; CGA-min A (9/13) Goal status: MET  ASSESSMENT:  CLINICAL IMPRESSION: Patient seen for skilled PT session with emphasis on gait training. She is progressing very well with upright posture and advancement of B LE without external assist. Erect posture limited by B knee flexion contractures, R >L. Therefore, requires increased assist with blocking B knees during stance phase of gait and lateral weight shift to allow for contralateral limb advancement. With fatigue, requires increased assist with advancing R LE. Responsive to cues to shift weight anteriorly over B UE on // bars, otherwise is retropulsive. Continue POC.  OBJECTIVE IMPAIRMENTS: cardiopulmonary status limiting activity, decreased activity tolerance, decreased balance, decreased coordination, decreased endurance, decreased knowledge of condition, decreased knowledge of use of DME, decreased mobility, difficulty walking, decreased ROM, decreased strength, impaired perceived functional ability, impaired sensation, impaired tone, improper body mechanics, and postural dysfunction.   ACTIVITY LIMITATIONS: carrying, lifting, bending, sitting, standing, squatting, stairs,  transfers, bed mobility, continence, bathing, toileting, dressing, self feeding, reach over head, hygiene/grooming, and locomotion level  PARTICIPATION LIMITATIONS: meal prep, cleaning, laundry, medication management, interpersonal relationship, shopping, and community activity  PERSONAL FACTORS: Age, Past/current experiences, Time since onset of injury/illness/exacerbation, Transportation, and 3+ comorbidities: see above  are also affecting patient's functional outcome.   REHAB POTENTIAL: Fair time since onset  CLINICAL DECISION MAKING: Evolving/moderate complexity  EVALUATION COMPLEXITY: Moderate  PLAN:  PT FREQUENCY: 2x/week  PT DURATION: 8 weeks  PLANNED INTERVENTIONS: Therapeutic exercises, Therapeutic activity, Neuromuscular re-education, Balance training, Gait training, Patient/Family education, Self Care, Joint mobilization, Stair training, Vestibular training, Visual/preceptual remediation/compensation, Orthotic/Fit training, DME instructions, Aquatic Therapy, Dry Needling, Manual therapy, and Re-evaluation  PLAN FOR NEXT SESSION: sitting balance, STS, transfers, gait in // bars, may ultimately need custom wc   Westley Foots, PT, DPT, [facility] 05/10/2023, 2:44 PM

## 2023-05-12 ENCOUNTER — Ambulatory Visit: Payer: Medicare (Managed Care)

## 2023-05-17 ENCOUNTER — Ambulatory Visit: Payer: Medicare (Managed Care) | Admitting: Occupational Therapy

## 2023-05-17 ENCOUNTER — Ambulatory Visit: Payer: Medicare (Managed Care) | Attending: Family

## 2023-05-17 ENCOUNTER — Ambulatory Visit: Payer: Medicare (Managed Care)

## 2023-05-17 DIAGNOSIS — R471 Dysarthria and anarthria: Secondary | ICD-10-CM | POA: Insufficient documentation

## 2023-05-17 DIAGNOSIS — R293 Abnormal posture: Secondary | ICD-10-CM | POA: Diagnosis present

## 2023-05-17 DIAGNOSIS — M6281 Muscle weakness (generalized): Secondary | ICD-10-CM | POA: Diagnosis present

## 2023-05-17 DIAGNOSIS — R1312 Dysphagia, oropharyngeal phase: Secondary | ICD-10-CM | POA: Insufficient documentation

## 2023-05-17 DIAGNOSIS — R278 Other lack of coordination: Secondary | ICD-10-CM | POA: Diagnosis present

## 2023-05-17 DIAGNOSIS — R29898 Other symptoms and signs involving the musculoskeletal system: Secondary | ICD-10-CM

## 2023-05-17 DIAGNOSIS — Z8673 Personal history of transient ischemic attack (TIA), and cerebral infarction without residual deficits: Secondary | ICD-10-CM

## 2023-05-17 DIAGNOSIS — R29818 Other symptoms and signs involving the nervous system: Secondary | ICD-10-CM

## 2023-05-17 DIAGNOSIS — R262 Difficulty in walking, not elsewhere classified: Secondary | ICD-10-CM | POA: Diagnosis present

## 2023-05-17 DIAGNOSIS — R2689 Other abnormalities of gait and mobility: Secondary | ICD-10-CM | POA: Diagnosis present

## 2023-05-17 NOTE — Patient Instructions (Addendum)
Email address?  Bring water bottle next time   Daily Speech Practice (1-2x/day) Warm Up Exercise: May-Me-My-Moe-Moo  Take a deep breath in between each word  Say it LOUD and STRONG without strain  5 times  "Ah" Exercises: Say "ahhhhh" with good quality up to 10 seconds  Take a deep breath Say it LOUD and STRONG without strain/hurting your voice  5 times  Pitch Glides: Low pitch "ahhh", mid range "ahhh", high pitch "ahhh (5 times) High pitch "ahhh", mid range "ahhh", low pitch "ahhh" (5 times) Changing your pitch, but the volume stays consistent  Read these sentences in strong, intentional voice. Read them again.  I love you, Heather.  What's for supper? Do we have any iced tea? Have I had any phone calls? Does my phone need charging? I'm feeling okay.  How are you feeling? How is Nadine Counts feeling? How is the weather? Get the phone please.

## 2023-05-17 NOTE — Therapy (Addendum)
OUTPATIENT SPEECH LANGUAGE PATHOLOGY [facility]   Patient Name: Selena Bush MRN: [id number] DOB:05-Feb-1946, 77 y.o., female Today's Date: 05/17/2023  PCP: Jethro Bastos, MD REFERRING PROVIDER: Alver Sorrow, NP  END OF SESSION:  End of Session - 05/17/23 1435     Visit Number 2    Number of Visits 25    Date for SLP Re-Evaluation 07/26/23    SLP Start Time 1402    SLP Stop Time  1445    SLP Time Calculation (min) 43 min    Activity Tolerance Patient tolerated treatment well              Past Medical History:  Diagnosis Date   Anxiety    Atrial fibrillation (HCC)    Cat bite of right hand 03/25/2019   Chronic diastolic CHF (congestive heart failure) (HCC)    a. echo 09/2014: EF 60-65%, diastolic dysfunction, mild LVH, nl RV size & systolic function, mildly dilated LA (4.3 cm), mild MR/TR, mildly elevated PASP 36.7 mm Hg   DDD (degenerative disc disease), cervical    DDD (degenerative disc disease), lumbar    Depression    Diffuse cystic mastopathy 2014   Dyspnea    Dysrhythmia    GERD (gastroesophageal reflux disease)    Gout    Headache    rare   HLD (hyperlipidemia)    a. statin intolerant 2/2 myalgias   Hx of dysplastic nevus 12/25/2018   L medial ankle   Hypercholesterolemia    Hypertension    Malignant neoplasm of upper-outer quadrant of female breast (HCC) 10/2012   Papillary DCIS, sentinel node negative. DR/PR positive. PARTIAL RIGHT MASTECTOMY FOR BREAST CANCER--HAD RADIATION - NO CHEMO --DR. CRYSTAL [place] ONCOLOGIST   Obesity    OSA on CPAP    Osteoarthritis of both knees    a. s/p right TKA 04/2013 & left TKA 09/2014   Otitis externa    Parkinson disease [facility])    Personal history of radiation therapy 2015   RIGHT breast-mammosite per pt   Pneumonia of both lungs due to infectious organism 01/08/2021   Sleep difficulties    LUNESTA HAS HELPED   Vaginal cyst    Past Surgical History:  Procedure Laterality Date    ABDOMINAL HYSTERECTOMY  1992   DUB; fibroids; endometriosis.  One remaining ovary.     BREAST LUMPECTOMY Right 2015   Papillary DCIS, sentinel node negative. DR/PR positive. PARTIAL RIGHT MASTECTOMY FOR BREAST CANCER--HAD RADIATION - NO CHEMO --DR. CRYSTAL [place] ONCOLOGIST   BREAST SURGERY Right 10/2012   Wide excision,APB RT 10 mm papillary DCIS, ER/PR positive. Sentinel node negative. Partial breast radiation.   CATARACT EXTRACTION, BILATERAL  02/13/2016   [facility].   CHOLECYSTECTOMY  1994   COLONOSCOPY  2015   1 benign polyp-every 5 years/ Dr Bluford Kaufmann   COLONOSCOPY WITH PROPOFOL N/A 02/10/2021   Procedure: COLONOSCOPY WITH PROPOFOL;  Surgeon: Earline Mayotte, MD;  Location: [facility]COPY;  Service: Endoscopy;  Laterality: N/A;   ERCP  1995   JOINT REPLACEMENT Right 04/2013   knee   PERICARDIOCENTESIS N/A 11/13/2017   Procedure: PERICARDIOCENTESIS;  Surgeon: Yvonne Kendall, MD;  Location: [facility] INVASIVE CV LAB;  Service: Cardiovascular;  Laterality: N/A;   TOTAL KNEE ARTHROPLASTY Right 04/16/2013   Procedure: RIGHT TOTAL KNEE ARTHROPLASTY;  Surgeon: Shelda Pal, MD;  Location: [facility] ORS;  Service: Orthopedics;  Laterality: Right;   TOTAL KNEE ARTHROPLASTY Left 09/15/2014   Procedure: LEFT TOTAL KNEE ARTHROPLASTY;  Surgeon: Madlyn Frankel  Charlann Boxer, MD;  Location: [facility] ORS;  Service: Orthopedics;  Laterality: Left;   TUBAL LIGATION  1979   UPPER GI ENDOSCOPY     Patient Active Problem List   Diagnosis Date Noted   Cerebral vascular disease 10/10/2022   Gait abnormality 07/11/2022   Pressure injury of skin 06/05/2022   Rib fracture 06/04/2022   Stage 3a chronic kidney disease (CKD) (HCC) 06/04/2022   Skin ulcer with fat layer exposed (HCC) 03/16/2022   Vitamin B 12 deficiency 01/27/2022   Malaise 01/27/2022   Fever and chills 01/27/2022   Wheezing 01/27/2022   Vitamin D deficiency 01/27/2022   Hallucinations 11/24/2021   Abdominal pain 11/12/2021   Falls frequently 03/24/2021   Difficulty  walking 04/04/2020   Hypophonia 01/15/2020   Numbness 12/28/2019   Left arm weakness 12/09/2019   Tremor 08/26/2019   Parkinsonism (HCC) 07/29/2019   DDD (degenerative disc disease), lumbar 10/19/2018   Preventative health care 10/19/2018   Prediabetes 10/19/2018   (HFpEF) heart failure with preserved ejection fraction (HCC) 12/12/2017   Pericardial effusion with cardiac tamponade 11/13/2017   Paroxysmal atrial fibrillation (HCC) 11/05/2017   Acute cough 05/30/2017   Anxiety 03/21/2016   Insomnia 07/17/2015   MI (mitral incompetence) 12/10/2014   Major depressive disorder 12/09/2014   OSA (obstructive sleep apnea) 12/09/2014   GERD (gastroesophageal reflux disease) 12/09/2014   Chronic diastolic CHF (congestive heart failure) (HCC)    HLD (hyperlipidemia)    Osteoarthritis of both knees    S/P left TKA 08/19/2014   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 (BMI) 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 (HCC) 04/17/2013   History of ductal carcinoma in situ (DCIS) of breast 11/05/2012   Essential hypertension, benign 11/05/2012    ONSET DATE: 04/06/2023 - referral date; dx with PD 2021; CVA/fall Feb 2024  REFERRING DIAG: Z86.73 (ICD-10-CM) - History of CVA (cerebrovascular accident)  THERAPY DIAG:  Dysarthria and anarthria  Dysphagia, oropharyngeal phase  Rationale for Evaluation and Treatment: Rehabilitation  SUBJECTIVE:   SUBJECTIVE STATEMENT: "it's mostly the volume" re: communication breakdown  Pt accompanied by: self  PERTINENT HISTORY: Iyanah was dx with PD in 2021. Referral to Dr. Arbutus Leas after Right LE weakness, fall, CVA revealed weak voice. Apparently she was ambulatory prior to CVA vs fall Feb 2024. She reports vague h/o of ST, however she does not recall what was worked on. At this time, she is dependent feeder and endorses coughing/choking with meals "a few times a week" Per Dr. Arbutus Leas consult: Unfortunately, the patient fell in February, 2024 (pt states had her walker and  just fell to the side, she thinks) and declined significantly after that.  Patient states that she had her walker with her and fell to her side and had to lay on the side for over 30 minutes before EMS came.  Her right leg would not work per patient.  [facility] to [facility] that day and her daughter states that they x-rayed the ankle because the right leg was not working. [facility] noted that patient could not walk, and previously had been able to and subsequently sent her to SNF at [facility].  She was not evaluated at the hospital.  She had an appt 4 days later with Dr. Terrace Arabia.  Daughter states that they reviewed the MRI done prior to the fall and did a carotid u/s.  No imaging was ordered after the fall.   She could stand when she left SNF but she couldn't walk but she was able to  raise the R leg after she worked for a month and rehab.  However, she can no longer stand now that she is home and seems to have backtracked.     PAIN:  Are you having pain? No  FALLS: Has patient fallen in last 6 months?  Yes, See PT evaluation for details  LIVING ENVIRONMENT: Lives with:  Personal care attendant, [name] Lives in: House/apartment  PLOF:  Level of assistance: Needed assistance with ADLs, Needed assistance with IADLS Employment: Retired  PATIENT GOALS: "To get to speak again the way I used to"  OBJECTIVE:   TODAY'S TREATMENT:                                                                                                                                          05-17-23: Administered Communicative Effectiveness Survey (score 26).  - Rated a 2: being part of a noisy environment.  - Rated a 3: Participating in conversation with strangers in a quiet place, conversing with a stranger over the telephone, having a conversation while traveling in a car, and having a conversation with someone at a distance.   Targeted improving vocal quality and increasing intensity through Speak Out! Program lesson 1. SLP lead pt  through exercises providing usual model prior to pt execution. Usual mod A required to achieve target dB this date. SLP developed modified Speak Out! Lesson to practice as daily HEP (see pt instructions) because pt does not have work book yet.  She did not bring email address today, so work book was not ordered, but pt was instructed to bring it next time. With prompting, pt generated 10 functional phrases to include in HEP. Averages this date:  Sustained "ah" 84 dB Glides 79 dB Counting 80 dB  Endorsed some occasional difficulty swallowing thin liquids. Reported self-regulating with open cup at home but unable to bring cup/straw to mouth independently over last 2 sessions. 1:1 assistance provided for all sips. Consecutive sips consumed with no overt s/sx of aspiration exhibited. Feeding difficulty appeared most related to positioning, in which SLP alerted OT of observations.    05/03/23 (eval day): Introduced [employer]! Program, work book and website. Demonstrated website. Completed modified Lesson one (due to time) - Mike averaged 78dB on warm up; 80dB on loud Ah, 70dB on glides, 70dB on counting. She does not recall her email, but she will bring it with her phone next session.   PATIENT EDUCATION: Education details: Speak Out! Exercise program, compensations for cognition, xerostomia ed, See Today's Treatment, See patient instructions Person educated: Patient Education method: Explanation, Demonstration, Verbal cues, and Handouts Education comprehension: returned demonstration, verbal cues required, and needs further education  HOME EXERCISE PROGRAM: Speak Out! Need to order workbook next session when she brings her email   GOALS: Goals reviewed with patient? Yes  SHORT TERM GOALS: Target date: 05/31/23  Complete Communicative Effectiveness Survey first 1-2  visits Baseline: Goal status: MET  2.  Pt will hit target dB on HEP with usual mod A         Baseline:  Goal status: IN  PROGRESS  3.  Pt will average 70dB 18/20 sentences with occasional min A Baseline:  Goal status:IN PROGRESS  4.  Pt will complete HEP twice a day consistently with occasional min A Baseline:  Goal status: IN PROGRESS  5.  Pt will average 68 dB over 5 minute simple conversation with occasional min A Baseline:  Goal status: IN PROGRESS  6.  Complete Bedside swallow eval and MBSS if indicated Baseline:  Goal status: IN PROGRESS  LONG TERM GOALS: Target date: 07/26/23  Pt will complete HEP for dysarthria (Speak Out!) twice a day with rare min A Baseline:  Goal status: IN PROGRESS  2.  Pt will average 70dB over 15 minute conversation with rare min A Baseline:  Goal status: IN PROGRESS  3.  Pt will improve score on Communication Effectiveness Survey by 3 points Baseline:  Goal status: IN PROGRESS  4.  Pt will follow swallow precaution and diet modifications pending MBSS if indicated Baseline:  Goal status: IN PROGRESS  5.  Pt will access 2 contacts on her cell phone and emails with usual mod A  Baseline:  Goal status: IN PROGRESS  ASSESSMENT:  CLINICAL IMPRESSION: Patient is a 77 y.o. female who was seen today for moderate hypokinetic dysarthria, oropharyngeal dysphagia, endorses memory change. Kaili reports decreased social interactions due to not being understood. She is told frequently her volume is too low. She has lost ability to use her phone to check emails, make calls. She is dependent feeder and today required my A to bring cup and straw to her mouth.  No overt s/s of aspiration with successive straw sips today. Clinical Bedside Eval/MBSS may be warranted. She is fed by her personal attendant. Burnice endorses coughing/choking with thin liquids several times a week. I recommend skilled ST to maximize intelligibility for safety, independence, participation in social activities and safety of swallow.    OBJECTIVE IMPAIRMENTS: Objective impairments include memory,  awareness, dysarthria, and dysphagia. These impairments are limiting patient from effectively communicating at home and in community.Factors affecting potential to achieve goals and functional outcome are medical prognosis and severity of impairments.. Patient will benefit from skilled SLP services to address above impairments and improve overall function.  REHAB POTENTIAL: Good  PLAN:  SLP FREQUENCY: 2x/week  SLP DURATION: 12 weeks  PLANNED INTERVENTIONS: Aspiration precaution training, Pharyngeal strengthening exercises, Diet toleration management , Language facilitation, Environmental controls, Trials of upgraded texture/liquids, Cueing hierachy, Cognitive reorganization, Internal/external aids, Functional tasks, Multimodal communication approach, SLP instruction and feedback, and Compensatory strategies, MBSS    Gracy Racer, CCC-SLP 05/17/2023, 3:07 PM

## 2023-05-17 NOTE — Therapy (Unsigned)
OUTPATIENT OCCUPATIONAL THERAPY NEURO TREATMENT  Patient Name: Selena Bush MRN: [id number] DOB:October 12, 1945, 77 y.o., female Today's Date: 05/17/2023  PCP: Jethro Bastos, MD  REFERRING PROVIDER: Alver Sorrow, NP  END OF SESSION:  OT End of Session - 05/17/23 1545     Visit Number 2    Number of Visits 17    Date for OT Re-Evaluation 06/28/23    Authorization Type [employer]    OT Start Time 1447    OT Stop Time 1529    OT Time Calculation (min) 42 min    Activity Tolerance Patient tolerated treatment well    Behavior During Therapy [facility] for tasks assessed/performed;Flat affect             Past Medical History:  Diagnosis Date   Anxiety    Atrial fibrillation (HCC)    Cat bite of right hand 03/25/2019   Chronic diastolic CHF (congestive heart failure) (HCC)    a. echo 09/2014: EF 60-65%, diastolic dysfunction, mild LVH, nl RV size & systolic function, mildly dilated LA (4.3 cm), mild MR/TR, mildly elevated PASP 36.7 mm Hg   DDD (degenerative disc disease), cervical    DDD (degenerative disc disease), lumbar    Depression    Diffuse cystic mastopathy 2014   Dyspnea    Dysrhythmia    GERD (gastroesophageal reflux disease)    Gout    Headache    rare   HLD (hyperlipidemia)    a. statin intolerant 2/2 myalgias   Hx of dysplastic nevus 12/25/2018   L medial ankle   Hypercholesterolemia    Hypertension    Malignant neoplasm of upper-outer quadrant of female breast (HCC) 10/2012   Papillary DCIS, sentinel node negative. DR/PR positive. PARTIAL RIGHT MASTECTOMY FOR BREAST CANCER--HAD RADIATION - NO CHEMO --DR. CRYSTAL [place] ONCOLOGIST   Obesity    OSA on CPAP    Osteoarthritis of both knees    a. s/p right TKA 04/2013 & left TKA 09/2014   Otitis externa    Parkinson disease [facility])    Personal history of radiation therapy 2015   RIGHT breast-mammosite per pt   Pneumonia of both lungs due to infectious organism 01/08/2021   Sleep difficulties     LUNESTA HAS HELPED   Vaginal cyst    Past Surgical History:  Procedure Laterality Date   ABDOMINAL HYSTERECTOMY  1992   DUB; fibroids; endometriosis.  One remaining ovary.     BREAST LUMPECTOMY Right 2015   Papillary DCIS, sentinel node negative. DR/PR positive. PARTIAL RIGHT MASTECTOMY FOR BREAST CANCER--HAD RADIATION - NO CHEMO --DR. CRYSTAL [place] ONCOLOGIST   BREAST SURGERY Right 10/2012   Wide excision,APB RT 10 mm papillary DCIS, ER/PR positive. Sentinel node negative. Partial breast radiation.   CATARACT EXTRACTION, BILATERAL  02/13/2016   [facility].   CHOLECYSTECTOMY  1994   COLONOSCOPY  2015   1 benign polyp-every 5 years/ Dr Bluford Kaufmann   COLONOSCOPY WITH PROPOFOL N/A 02/10/2021   Procedure: COLONOSCOPY WITH PROPOFOL;  Surgeon: Earline Mayotte, MD;  Location: [facility] ENDOSCOPY;  Service: Endoscopy;  Laterality: N/A;   ERCP  1995   JOINT REPLACEMENT Right 04/2013   knee   PERICARDIOCENTESIS N/A 11/13/2017   Procedure: PERICARDIOCENTESIS;  Surgeon: Yvonne Kendall, MD;  Location: [facility] INVASIVE CV LAB;  Service: Cardiovascular;  Laterality: N/A;   TOTAL KNEE ARTHROPLASTY Right 04/16/2013   Procedure: RIGHT TOTAL KNEE ARTHROPLASTY;  Surgeon: Shelda Pal, MD;  Location: [facility] ORS;  Service: Orthopedics;  Laterality: Right;  TOTAL KNEE ARTHROPLASTY Left 09/15/2014   Procedure: LEFT TOTAL KNEE ARTHROPLASTY;  Surgeon: Shelda Pal, MD;  Location: [facility] ORS;  Service: Orthopedics;  Laterality: Left;   TUBAL LIGATION  1979   UPPER GI ENDOSCOPY     Patient Active Problem List   Diagnosis Date Noted   Cerebral vascular disease 10/10/2022   Gait abnormality 07/11/2022   Pressure injury of skin 06/05/2022   Rib fracture 06/04/2022   Stage 3a chronic kidney disease (CKD) (HCC) 06/04/2022   Skin ulcer with fat layer exposed (HCC) 03/16/2022   Vitamin B 12 deficiency 01/27/2022   Malaise 01/27/2022   Fever and chills 01/27/2022   Wheezing 01/27/2022   Vitamin D deficiency 01/27/2022    Hallucinations 11/24/2021   Abdominal pain 11/12/2021   Falls frequently 03/24/2021   Difficulty walking 04/04/2020   Hypophonia 01/15/2020   Numbness 12/28/2019   Left arm weakness 12/09/2019   Tremor 08/26/2019   Parkinsonism (HCC) 07/29/2019   DDD (degenerative disc disease), lumbar 10/19/2018   Preventative health care 10/19/2018   Prediabetes 10/19/2018   (HFpEF) heart failure with preserved ejection fraction (HCC) 12/12/2017   Pericardial effusion with cardiac tamponade 11/13/2017   Paroxysmal atrial fibrillation (HCC) 11/05/2017   Acute cough 05/30/2017   Anxiety 03/21/2016   Insomnia 07/17/2015   MI (mitral incompetence) 12/10/2014   Major depressive disorder 12/09/2014   OSA (obstructive sleep apnea) 12/09/2014   GERD (gastroesophageal reflux disease) 12/09/2014   Chronic diastolic CHF (congestive heart failure) (HCC)    HLD (hyperlipidemia)    Osteoarthritis of both knees    S/P left TKA 08/19/2014   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 (BMI) 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 (HCC) 04/17/2013   History of ductal carcinoma in situ (DCIS) of breast 11/05/2012   Essential hypertension, benign 11/05/2012   ONSET DATE: 04/06/2023 (date of referral)  REFERRING DIAG: Z86.73 (ICD-10-CM) - History of CVA  THERAPY DIAG:  Muscle weakness (generalized)  Other lack of coordination  Other symptoms and signs involving the musculoskeletal system  Other symptoms and signs involving the nervous system  History of CVA (cerebrovascular accident)  Rationale for Evaluation and Treatment: Rehabilitation  SUBJECTIVE:   SUBJECTIVE STATEMENT: Pt reports she has gotten a few skin tears since her last visit. She is unable to recall exact details of the tears on her R arm except for saying her therapist (not at this clinic) grabbed the back of her right hand and caused that bandaged tear. She states she fell while being assisted to the bathroom about a week ago hitting her L lower  leg. She states the area around the hematoma is now sore to the touch. She denies use of gait belt with caregiver transfers and does state sometimes her caregivers hold onto her arms.   Pt accompanied by: self  PERTINENT HISTORY: PD, CVA, Depression, arthritis, GERD, CA, CHF, HLD, HTN   PRECAUTIONS: Fall  WEIGHT BEARING RESTRICTIONS: No  PAIN:  Are you having pain? Yes: NPRS scale: 0/10 Pain location: neck Pain description: hurts Aggravating factors: posture Relieving factors: Biofreeze  FALLS: Has patient fallen in last 6 months? Yes. Number of falls fell from her stroke  LIVING ENVIRONMENT: Lives with:  caregiver Lives in: House/apartment Stairs:  ramped entrance Has following equipment at home: Single point cane, Walker - 2 wheeled, Environmental consultant - 4 wheeled, Wheelchair (manual), shower chair, bed side commode, Ramped entry, and [employer] lift   PLOF: Needs assistance with ADLs, Needs assistance with homemaking, Needs assistance with  gait, and Needs assistance with transfers  PATIENT GOALS: improve use of BUEs  OBJECTIVE: (from evaluation unless otherwise noted)  05/17/2023: Multiple skin tears to RUE; 2 bandaged -1 proximal forearm and 1 on dorsal R hand; mid forearm tear in outline of rosary bracelet; dried blood on rolled up sleeve Cyanotic skin of L hand, which is cool to the touch, no improvement to color with gentle massage or positioning; pt has active movement; denies pain Feet sliding off foot plates of wheelchair, repositioned flat on floor with indention on top of foot from where tongue of shoe rested against dorsiflexed foot; pt does not have socks donned; signs of skin break down on R foot though blanches L lower leg has hematoma laterally with erythema surrounding approximately 10-12" in length extending from just below knee to top of shoe and approximately 4-5" wide  HAND DOMINANCE: Right  ADLs: Overall ADLs: dependent  IADLs: Overall IADLs: dependent  MOBILITY  STATUS:  wheelchair bound  POSTURE COMMENTS:  rounded shoulders, forward head, increased thoracic kyphosis, right pelvic obliquity, flexed trunk , and weight shift right   ACTIVITY TOLERANCE: Activity tolerance: fair  FUNCTIONAL OUTCOME MEASURES: PSFS: 0   Total score = sum of the activity scores/number of activities Minimum detectable change (90%CI) for average score = 2 points Minimum detectable change (90%CI) for single activity score = 3 points   UPPER EXTREMITY ROM:     AROM Right (eval) Left (eval)  Shoulder flexion 80 45  Shoulder abduction 45 45  Elbow flexion WNL WFL  Elbow extension -40 -30  Wrist flexion 60 62  Wrist extension 30 38  Wrist pronation WNL WNL  Wrist supination To neutral 10  Digit Composite Flexion Lacks 2 cm 2nd digit Lacks 1 cm 2nd digit  Digit Composite Extension WFL WNL  Digit Opposition WFL WFL  (Blank rows = not tested)  UPPER EXTREMITY MMT:     MMT Right (eval) Left (eval)  Shoulder flexion BFL BFL  Shoulder abduction BFL BFL  Elbow flexion [facility] WFL  Elbow extension WFL WFL  (Blank rows = not tested) Lacks endurance with testing.   HAND FUNCTION: Grip strength: Right: 21.8 lbs; Left: 25.5 lbs  COORDINATION: Box and Blocks:  Right 3blocks, Left 5blocks  SENSATION: WFL  EDEMA: none reported or observed  MUSCLE TONE: RUE: Moderate and Rigidity and LUE: Moderate and Rigidity  COGNITION: Overall cognitive status: Within functional limits for tasks assessed  VISION: To be assessed during future visit  PERCEPTION: Not tested  PRAXIS: Not tested  OBSERVATIONS: Pt requires assistance to propel wheelchair. Poor positioning within wheelchair pushing BUE into shoulder protraction, elbow flexion, shoulder adduction, and wrist flexion. Unable to look pt directly in the eyes given kyphotic and forward head posturing. Appears well kept. Glasses donned. Pt able to hold small sandwich in her R hand upon leaving clinic.   TODAY'S  TREATMENT:  OT educated pt on concern for cellulitis given presentation of L leg near area of hematoma. She was encouraged to have this checked out upon returning to [facility] and monitor for changes. She was also encouraged to advocate for herself and request use of a gait belt (over clothing) and ask that caregivers not pull or hold onto her arms or hands to reduce risk of skin tears.   OT attempted to massage L hand to promote improved circulation due to cyanotic skin though no improvement noted. Pt denied pain and demonstrated functional use. Pt advised to monitor and allow arm to be repositioned frequently instead of keeping elbows at 90*  Due to feet positioning, OT placed feet on blocks to allow bottoms of feet to be parallel with floor while alleviating pressure off heels. Therapist educated pt on BLE positioning and edema noted through feet with concern for skin break down.   Pt practiced elbow extension and flexion with each extremity with removal of wheelchair armrests and cushioning. Pt then used light-weight, handled tumbler supported with both hands to bring to mouth to take sips of water using a straw. Pt demonstrates ability to complete simulated task without a cup with much improvement. She was encouraged to bring hands together and then to mouth at home to help increase strength and practice movement for drinking.   PATIENT EDUCATION: Education details: Stretching; functional movements; skin care and monitoring, self-advocacy Person educated: Patient Education method: Explanation; written instructions Education comprehension: verbalized understanding  HOME EXERCISE PROGRAM: N/A This date  GOALS:  SHORT TERM GOALS: Target date: 05/31/2023    Patient will demonstrate independence with initial BUE ROM HEP. Baseline: not yet initiated Goal status: IN  PROGRESS  2.  Pt will be able to place at least 8 blocks using right hand with completion of Box and Blocks test.  Baseline: Right 3 blocks Goal status: INITIAL  3.  Pt will be able to place at least 10 blocks using left hand with completion of Box and Blocks test. Baseline: Left 5 blocks Goal status: INITIAL  4.  Pt will independently recall at least 3 positioning and pressure relief strategies as needed to prevent pressure injuries and contractures.  Baseline:  Goal status: INITIAL  5.  Pt will demonstrate full composite flexion bilaterally as needed to pick up and hold ADL items such as hair brush, tooth brush, eating utensils, etc.  Baseline:  Right  Left Lacks 2 cm 2nd digit Lacks 1 cm 2nd digit  Goal status: INITIAL  LONG TERM GOALS: Target date: 06/28/2023    Patient will demonstrate updated RUE and LUE HEP with 25% verbal cues or less for proper execution. Baseline: not yet initiated Goal status: INITIAL  2.  Pt will verbalize understanding of ways to prevent future PD related complications. Baseline: not yet initiated Goal status: INITIAL  3.  Pt will be able to place at least 12 blocks using right hand with completion of Box and Blocks test.  Baseline: Right 3 blocks Goal status: INITIAL  4.  Pt will be able to place at least 14 blocks using left hand with completion of Box and Blocks test. Baseline: Left 5 blocks Goal status: INITIAL  5.  Patient will report at least two-point increase in average PSFS score or at least three-point increase in a single activity score indicating functionally significant improvement given minimum detectable change.  Baseline: 0 total score (See above for individual activity scores)  Baseline:  Goal status: INITIAL  ASSESSMENT:  CLINICAL  IMPRESSION: Given pt presentation today, there are concerns for cellulitis and infection given her L leg and skin tears. Recommend pt utilize gait belt for all transfers and not holding onto the  pt's arms or hands due to poor skin integrity. Will continue to monitor pt presentation. Despite concerns with clinical presentation, pt demonstrating improved ability to hold and bring cup to mouth as needed for eating and drinking.  PERFORMANCE DEFICITS: in functional skills including ADLs, IADLs, coordination, tone, ROM, strength, Fine motor control, Gross motor control, mobility, body mechanics, decreased knowledge of use of DME, skin integrity, and UE functional use.   IMPAIRMENTS: are limiting patient from ADLs, IADLs, leisure, and social participation.   CO-MORBIDITIES: may have co-morbidities  that affects occupational performance. Patient will benefit from skilled OT to address above impairments and improve overall function.  REHAB POTENTIAL: Fair given chronicity of poor posturing and   PLAN:  OT FREQUENCY: 2x/week  OT DURATION: 8 weeks  PLANNED INTERVENTIONS: self care/ADL training, therapeutic exercise, therapeutic activity, neuromuscular re-education, manual therapy, passive range of motion, functional mobility training, splinting, electrical stimulation, ultrasound, fluidotherapy, moist heat, patient/family education, visual/perceptual remediation/compensation, energy conservation, coping strategies training, DME and/or AE instructions, and Re-evaluation  RECOMMENDED OTHER SERVICES: none at this time. Pt is in process of obtaining w/c eval.   CONSULTED AND AGREED WITH PLAN OF CARE: Patient  PLAN FOR NEXT SESSION: Review cup holding/drinking; review/establish HEP; does pt have NMES?  Delana Meyer, OT 05/17/2023, 3:47 PM

## 2023-05-18 ENCOUNTER — Ambulatory Visit: Payer: Medicare (Managed Care) | Attending: Family | Admitting: Physician Assistant

## 2023-05-18 ENCOUNTER — Encounter: Payer: Self-pay | Admitting: Physician Assistant

## 2023-05-18 VITALS — BP 96/60 | HR 54 | Ht 61.0 in | Wt 144.0 lb

## 2023-05-18 DIAGNOSIS — I480 Paroxysmal atrial fibrillation: Secondary | ICD-10-CM

## 2023-05-18 DIAGNOSIS — G20C Parkinsonism, unspecified: Secondary | ICD-10-CM

## 2023-05-18 DIAGNOSIS — I959 Hypotension, unspecified: Secondary | ICD-10-CM | POA: Diagnosis not present

## 2023-05-18 DIAGNOSIS — T148XXA Other injury of unspecified body region, initial encounter: Secondary | ICD-10-CM

## 2023-05-18 DIAGNOSIS — R600 Localized edema: Secondary | ICD-10-CM | POA: Diagnosis not present

## 2023-05-18 MED ORDER — METOPROLOL TARTRATE 25 MG PO TABS: 12.5000 mg | ORAL_TABLET | Freq: Two times a day (BID) | ORAL | 3 refills | Status: DC

## 2023-05-18 NOTE — Patient Instructions (Signed)
Medication Instructions:  DECREASE METOPROLOL TARTRATE TO 12.5 MG (CUT 25 MG TABLET IN 1/2) TWICE A DAY.  TAKE EXTRA DOSE OF LASIX(40MG ) FOR 3 DAYS THEN BACK TO 20 MG.  *If you need a refill on your cardiac medications before your next appointment, please call your pharmacy*   Lab Work: NO LABS If you have labs (blood work) drawn today and your tests are completely normal, you will receive your results only by: MyChart Message (if you have MyChart) OR A paper copy in the mail If you have any lab test that is abnormal or we need to change your treatment, we will call you to review the results.   Testing/Procedures: NO TESTING   Follow-Up: At [facility], you and your health needs are our priority.  As part of our continuing mission to provide you with exceptional heart care, we have created designated Provider Care Teams.  These Care Teams include your primary Cardiologist (physician) and Advanced Practice Providers (APPs -  Physician Assistants and Nurse Practitioners) who all work together to provide you with the care you need, when you need it.   Your next appointment:   4 month(s)  Provider:   Olga Millers, MD ONLY

## 2023-05-18 NOTE — Progress Notes (Signed)
Cardiology Office Note:  .   Date:  05/18/2023  ID:  Selena Bush, DOB March 25, 1946, MRN [id number] PCP: Jethro Bastos, MD  [facility] Providers Cardiologist:  Olga Millers, MD    History of Present Illness: .   Selena Bush is a 77 y.o. female with PMH of HTN, HLD, PAF not on anticoagulation given prior hemorrhagic pericardial effusion, chronic diastolic heart failure, aortic stenosis, carotid artery stenosis, coronary atherosclerosis on CT and parkinsonism.  Patient was hospitalized in March 2019 due to atypical chest pain and PAF.  She was initially started on Xarelto, however follow-up echocardiogram showed a large pericardial effusion with tamponade physiology.  Anticoagulation therapy stopped.  She was maintained on sotalol and metoprolol.  She underwent pericardiocentesis in April 2019.  Cytology was negative.  It was felt initial presentation was likely pericarditis.  Echocardiogram in February 2022 showed normal EF, mild LVH, grade 2 DD, moderate pulmonary hypertension, severe LAE and mild aortic stenosis.  Repeat echocardiogram in April 2024 showed EF 65 to 70%, no regional wall motion abnormality, mild LVH, no pericardial effusion.  Carotid Doppler obtained around the same time showed 1 to 39% bilateral ICA stenosis.  She was seen in May 2024 for chest pain while laying in recliner a week after COVID infection.  She was offered repeat a limited echocardiogram and possible ischemic evaluation but preferred to monitor her symptom.  Since then, she has been evaluated by neurology service for right leg weakness.  MRI of the brain obtained on 01/13/2023 showed suspected 5 mm early subacute infarct in the right precentral gyrus, severe chronic small vessel ischemic disease, new small chronic cortical infarcts in the left frontal and parietal lobes.  She was referred back to cardiology service for consideration of anticoagulation therapy.  She was seen by Gillian Shields, NP on  02/14/2023 at which time she was largely wheelchair-bound whereas previously she was more mobile.  He was started on Plavix 75 mg daily.  14-day ZIO monitor was placed in the clinic.  Based on phone message on 02/17/2023, she was ultimately agreeable to start on Eliquis.  Plavix discontinued.  A follow-up echocardiogram was recommended.  Heart monitor obtained in July 2024 showed brief episode of A-fib, overall A-fib burden was 1%, heart rate 41-138, average heart rate 54 bpm.  Echocardiogram obtained 04/21/2023 demonstrated EF 65 to 70%, mild LVH of basal septal segment, grade 2 DD, mildly in the RV, elevated LVEDP, mild LAE, trivial MR.  Due to swelling of the right ankle, she was started on 40 mg daily of Lasix in August 2024.  Patient presents today for follow-up.  O2 saturation recorded is likely not accurate as she has pink nail polish.  I attempted to measure her O2 saturation using 5 different other fingers without success.  She denies significant shortness of breath.  On exam, she has diffuse bibasilar crackles.  She has ankle edema as well.  My suspicion is her bibasilar crackles more likely to be atelectasis rather than pulmonary edema.  I asked her to take deep breath every time she see a TV commercial.  Although her Lasix was previously increased to 40 mg daily, however Lasix dose has since back down to 20 mg daily.  I also asked her to increase Lasix to 40 mg daily for the next 3 days before going back to the previous dose.  She remained borderline hypotensive.  She denies any significant orthostatic dizziness or feeling of passing out when she stands up.  Cardiology Office Note:  .   Date:  05/18/2023  ID:  Selena Bush, DOB March 25, 1946, MRN [id number] PCP: Jethro Bastos, MD  [facility] Providers Cardiologist:  Olga Millers, MD    History of Present Illness: .   Selena Bush is a 77 y.o. female with PMH of HTN, HLD, PAF not on anticoagulation given prior hemorrhagic pericardial effusion, chronic diastolic heart failure, aortic stenosis, carotid artery stenosis, coronary atherosclerosis on CT and parkinsonism.  Patient was hospitalized in March 2019 due to atypical chest pain and PAF.  She was initially started on Xarelto, however follow-up echocardiogram showed a large pericardial effusion with tamponade physiology.  Anticoagulation therapy stopped.  She was maintained on sotalol and metoprolol.  She underwent pericardiocentesis in April 2019.  Cytology was negative.  It was felt initial presentation was likely pericarditis.  Echocardiogram in February 2022 showed normal EF, mild LVH, grade 2 DD, moderate pulmonary hypertension, severe LAE and mild aortic stenosis.  Repeat echocardiogram in April 2024 showed EF 65 to 70%, no regional wall motion abnormality, mild LVH, no pericardial effusion.  Carotid Doppler obtained around the same time showed 1 to 39% bilateral ICA stenosis.  She was seen in May 2024 for chest pain while laying in recliner a week after COVID infection.  She was offered repeat a limited echocardiogram and possible ischemic evaluation but preferred to monitor her symptom.  Since then, she has been evaluated by neurology service for right leg weakness.  MRI of the brain obtained on 01/13/2023 showed suspected 5 mm early subacute infarct in the right precentral gyrus, severe chronic small vessel ischemic disease, new small chronic cortical infarcts in the left frontal and parietal lobes.  She was referred back to cardiology service for consideration of anticoagulation therapy.  She was seen by Gillian Shields, NP on  02/14/2023 at which time she was largely wheelchair-bound whereas previously she was more mobile.  He was started on Plavix 75 mg daily.  14-day ZIO monitor was placed in the clinic.  Based on phone message on 02/17/2023, she was ultimately agreeable to start on Eliquis.  Plavix discontinued.  A follow-up echocardiogram was recommended.  Heart monitor obtained in July 2024 showed brief episode of A-fib, overall A-fib burden was 1%, heart rate 41-138, average heart rate 54 bpm.  Echocardiogram obtained 04/21/2023 demonstrated EF 65 to 70%, mild LVH of basal septal segment, grade 2 DD, mildly in the RV, elevated LVEDP, mild LAE, trivial MR.  Due to swelling of the right ankle, she was started on 40 mg daily of Lasix in August 2024.  Patient presents today for follow-up.  O2 saturation recorded is likely not accurate as she has pink nail polish.  I attempted to measure her O2 saturation using 5 different other fingers without success.  She denies significant shortness of breath.  On exam, she has diffuse bibasilar crackles.  She has ankle edema as well.  My suspicion is her bibasilar crackles more likely to be atelectasis rather than pulmonary edema.  I asked her to take deep breath every time she see a TV commercial.  Although her Lasix was previously increased to 40 mg daily, however Lasix dose has since back down to 20 mg daily.  I also asked her to increase Lasix to 40 mg daily for the next 3 days before going back to the previous dose.  She remained borderline hypotensive.  She denies any significant orthostatic dizziness or feeling of passing out when she stands up.  Cardiology Office Note:  .   Date:  05/18/2023  ID:  Selena Bush, DOB March 25, 1946, MRN [id number] PCP: Jethro Bastos, MD  [facility] Providers Cardiologist:  Olga Millers, MD    History of Present Illness: .   Selena Bush is a 77 y.o. female with PMH of HTN, HLD, PAF not on anticoagulation given prior hemorrhagic pericardial effusion, chronic diastolic heart failure, aortic stenosis, carotid artery stenosis, coronary atherosclerosis on CT and parkinsonism.  Patient was hospitalized in March 2019 due to atypical chest pain and PAF.  She was initially started on Xarelto, however follow-up echocardiogram showed a large pericardial effusion with tamponade physiology.  Anticoagulation therapy stopped.  She was maintained on sotalol and metoprolol.  She underwent pericardiocentesis in April 2019.  Cytology was negative.  It was felt initial presentation was likely pericarditis.  Echocardiogram in February 2022 showed normal EF, mild LVH, grade 2 DD, moderate pulmonary hypertension, severe LAE and mild aortic stenosis.  Repeat echocardiogram in April 2024 showed EF 65 to 70%, no regional wall motion abnormality, mild LVH, no pericardial effusion.  Carotid Doppler obtained around the same time showed 1 to 39% bilateral ICA stenosis.  She was seen in May 2024 for chest pain while laying in recliner a week after COVID infection.  She was offered repeat a limited echocardiogram and possible ischemic evaluation but preferred to monitor her symptom.  Since then, she has been evaluated by neurology service for right leg weakness.  MRI of the brain obtained on 01/13/2023 showed suspected 5 mm early subacute infarct in the right precentral gyrus, severe chronic small vessel ischemic disease, new small chronic cortical infarcts in the left frontal and parietal lobes.  She was referred back to cardiology service for consideration of anticoagulation therapy.  She was seen by Gillian Shields, NP on  02/14/2023 at which time she was largely wheelchair-bound whereas previously she was more mobile.  He was started on Plavix 75 mg daily.  14-day ZIO monitor was placed in the clinic.  Based on phone message on 02/17/2023, she was ultimately agreeable to start on Eliquis.  Plavix discontinued.  A follow-up echocardiogram was recommended.  Heart monitor obtained in July 2024 showed brief episode of A-fib, overall A-fib burden was 1%, heart rate 41-138, average heart rate 54 bpm.  Echocardiogram obtained 04/21/2023 demonstrated EF 65 to 70%, mild LVH of basal septal segment, grade 2 DD, mildly in the RV, elevated LVEDP, mild LAE, trivial MR.  Due to swelling of the right ankle, she was started on 40 mg daily of Lasix in August 2024.  Patient presents today for follow-up.  O2 saturation recorded is likely not accurate as she has pink nail polish.  I attempted to measure her O2 saturation using 5 different other fingers without success.  She denies significant shortness of breath.  On exam, she has diffuse bibasilar crackles.  She has ankle edema as well.  My suspicion is her bibasilar crackles more likely to be atelectasis rather than pulmonary edema.  I asked her to take deep breath every time she see a TV commercial.  Although her Lasix was previously increased to 40 mg daily, however Lasix dose has since back down to 20 mg daily.  I also asked her to increase Lasix to 40 mg daily for the next 3 days before going back to the previous dose.  She remained borderline hypotensive.  She denies any significant orthostatic dizziness or feeling of passing out when she stands up.  She is largely wheelchair-bound at this time.  I will reduce her dose of metoprolol to tartrate to 12.5 mg twice a day.  She can follow-up with Dr. Jens Som in 4 months.  Her left lower extremity has a hematoma on the lateral side.  She says she bumped into a metal object 2 weeks ago.  The hematoma has reduced in size in the past 2 weeks.  ROS:   She  has fatigue and feeling cold.  She denies any fever.  She denies any chest pain or significant shortness of breath.  Studies Reviewed: .        Cardiac Studies & Procedures   CARDIAC CATHETERIZATION  CARDIAC CATHETERIZATION 11/13/2017  Narrative Conclusions: 1. Successful ultrasound and fluoroscopic guided pericardiocentesis and pericardial drain placement (tube pericardiostomy) via the subxiphoid approach yielding 620 mL of blood fluid.  Recommendations: 1. Transfer to 2 Heart for pericardial drain management. 2. Obtain STAT CXR when patient arrives in ICU to confirm tube placement and exclude complications such as pneumothorax. 3. Keep drain to negative pressure using Doval device; record output every 4 hours. 4. Drain to be removed when less than 50 mL of output in 24 hours and no significant residual effusion on echo. 5. Hold all anticoagulants at this time.  Yvonne Kendall, MD [facility] Pager: [phone]     ECHOCARDIOGRAM  ECHOCARDIOGRAM COMPLETE 03/21/2023  Narrative ECHOCARDIOGRAM REPORT    Patient Name:   ARIANI SEIER Date of Exam: 03/21/2023 Medical Rec #:  [id number]       Height:       65.0 in Accession #:    [id number]      Weight:       144.0 lb Date of Birth:  [birth year]       BSA:          1.720 m Patient Age:    76 years        BP:           98/76 mmHg Patient Gender: F               HR:           60 bpm. Exam Location:  Outpatient  Procedure: 2D Echo, 3D Echo, Cardiac Doppler, Color Doppler and Strain Analysis  Indications:    Endocarditis  History:        Patient has prior history of Echocardiogram examinations, most recent 11/15/2022. CHF, Arrythmias:Atrial Fibrillation; Risk Factors:Non-Smoker, Dyslipidemia and Hypertension. CKD; Pericardial Effusion with cardiac tamponade; Parkinson.  Sonographer:    Jeryl Columbia RDCS Referring Phys: [id number] CAITLIN Tilman Neat   Sonographer Comments: Limited mobility; echo performed in wheelchair due  to patient being unable to move to bed. IMPRESSIONS   1. Left ventricular ejection fraction, by estimation, is 65 to 70%. The left ventricle has normal function. The left ventricle has no regional wall motion abnormalities. There is mild left ventricular hypertrophy of the basal-septal segment. Left ventricular diastolic parameters are consistent with Grade II diastolic dysfunction (pseudonormalization). Elevated left ventricular end-diastolic pressure. 2. Right ventricular systolic function is normal. The right ventricular size is mildly enlarged. 3. Left atrial size was mildly dilated. 4. The mitral valve is degenerative. Trivial mitral valve regurgitation. No evidence of mitral stenosis. Severe mitral annular calcification. 5. The aortic valve is tricuspid. There is moderate calcification of the aortic valve. There is mild thickening of the aortic valve. Aortic valve regurgitation is not visualized. Aortic valve sclerosis/calcification is present, without any evidence of aortic stenosis.

## 2023-05-19 ENCOUNTER — Ambulatory Visit: Payer: Medicare (Managed Care)

## 2023-05-19 DIAGNOSIS — R471 Dysarthria and anarthria: Secondary | ICD-10-CM | POA: Diagnosis not present

## 2023-05-19 DIAGNOSIS — R293 Abnormal posture: Secondary | ICD-10-CM

## 2023-05-19 DIAGNOSIS — M6281 Muscle weakness (generalized): Secondary | ICD-10-CM

## 2023-05-19 DIAGNOSIS — R2689 Other abnormalities of gait and mobility: Secondary | ICD-10-CM

## 2023-05-19 DIAGNOSIS — R262 Difficulty in walking, not elsewhere classified: Secondary | ICD-10-CM

## 2023-05-19 DIAGNOSIS — R278 Other lack of coordination: Secondary | ICD-10-CM

## 2023-05-19 NOTE — Therapy (Signed)
OUTPATIENT PHYSICAL THERAPY NEURO TREATMENT   Patient Name: SATONYA JESSIE MRN: [id number] DOB:1946-01-16, 77 y.o., female Today's Date: 05/19/2023   PCP: Marny Lowenstein, MD REFERRING PROVIDER: Gillian Shields, NP  END OF SESSION:  PT End of Session - 05/19/23 1252     Visit Number 7    Number of Visits 17    Date for PT Re-Evaluation 06/16/23    Authorization Type PACE    PT Start Time 1317    PT Stop Time 1359    PT Time Calculation (min) 42 min    Equipment Utilized During Treatment Gait belt    Activity Tolerance Patient tolerated treatment well    Behavior During Therapy WFL for tasks assessed/performed               Past Medical History:  Diagnosis Date   Anxiety    Atrial fibrillation (HCC)    Cat bite of right hand 03/25/2019   Chronic diastolic CHF (congestive heart failure) (HCC)    a. echo 09/2014: EF 60-65%, diastolic dysfunction, mild LVH, nl RV size & systolic function, mildly dilated LA (4.3 cm), mild MR/TR, mildly elevated PASP 36.7 mm Hg   DDD (degenerative disc disease), cervical    DDD (degenerative disc disease), lumbar    Depression    Diffuse cystic mastopathy 2014   Dyspnea    Dysrhythmia    GERD (gastroesophageal reflux disease)    Gout    Headache    rare   HLD (hyperlipidemia)    a. statin intolerant 2/2 myalgias   Hx of dysplastic nevus 12/25/2018   L medial ankle   Hypercholesterolemia    Hypertension    Malignant neoplasm of upper-outer quadrant of female breast (HCC) 10/2012   Papillary DCIS, sentinel node negative. DR/PR positive. PARTIAL RIGHT MASTECTOMY FOR BREAST CANCER--HAD RADIATION - NO CHEMO --DR. CRYSTAL Bascom ONCOLOGIST   Obesity    OSA on CPAP    Osteoarthritis of both knees    a. s/p right TKA 04/2013 & left TKA 09/2014   Otitis externa    Parkinson disease [facility])    Personal history of radiation therapy 2015   RIGHT breast-mammosite per pt   Pneumonia of both lungs due to infectious organism 01/08/2021    Sleep difficulties    LUNESTA HAS HELPED   Vaginal cyst    Past Surgical History:  Procedure Laterality Date   ABDOMINAL HYSTERECTOMY  1992   DUB; fibroids; endometriosis.  One remaining ovary.     BREAST LUMPECTOMY Right 2015   Papillary DCIS, sentinel node negative. DR/PR positive. PARTIAL RIGHT MASTECTOMY FOR BREAST CANCER--HAD RADIATION - NO CHEMO --DR. CRYSTAL [place] ONCOLOGIST   BREAST SURGERY Right 10/2012   Wide excision,APB RT 10 mm papillary DCIS, ER/PR positive. Sentinel node negative. Partial breast radiation.   CATARACT EXTRACTION, BILATERAL  02/13/2016   [facility].   CHOLECYSTECTOMY  1994   COLONOSCOPY  2015   1 benign polyp-every 5 years/ Dr Bluford Kaufmann   COLONOSCOPY WITH PROPOFOL N/A 02/10/2021   Procedure: COLONOSCOPY WITH PROPOFOL;  Surgeon: Earline Mayotte, MD;  Location: [facility] ENDOSCOPY;  Service: Endoscopy;  Laterality: N/A;   ERCP  1995   JOINT REPLACEMENT Right 04/2013   knee   PERICARDIOCENTESIS N/A 11/13/2017   Procedure: PERICARDIOCENTESIS;  Surgeon: Yvonne Kendall, MD;  Location: [facility] INVASIVE CV LAB;  Service: Cardiovascular;  Laterality: N/A;   TOTAL KNEE ARTHROPLASTY Right 04/16/2013   Procedure: RIGHT TOTAL KNEE ARTHROPLASTY;  Surgeon: Shelda Pal, MD;  Location: [facility] ORS;  Service: Orthopedics;  Laterality: Right;   TOTAL KNEE ARTHROPLASTY Left 09/15/2014   Procedure: LEFT TOTAL KNEE ARTHROPLASTY;  Surgeon: Shelda Pal, MD;  Location: [facility] ORS;  Service: Orthopedics;  Laterality: Left;   TUBAL LIGATION  1979   UPPER GI ENDOSCOPY     Patient Active Problem List   Diagnosis Date Noted   Cerebral vascular disease 10/10/2022   Gait abnormality 07/11/2022   Pressure injury of skin 06/05/2022   Rib fracture 06/04/2022   Stage 3a chronic kidney disease (CKD) (HCC) 06/04/2022   Skin ulcer with fat layer exposed (HCC) 03/16/2022   Vitamin B 12 deficiency 01/27/2022   Malaise 01/27/2022   Fever and chills 01/27/2022   Wheezing 01/27/2022   Vitamin D  deficiency 01/27/2022   Hallucinations 11/24/2021   Abdominal pain 11/12/2021   Falls frequently 03/24/2021   Difficulty walking 04/04/2020   Hypophonia 01/15/2020   Numbness 12/28/2019   Left arm weakness 12/09/2019   Tremor 08/26/2019   Parkinsonism (HCC) 07/29/2019   DDD (degenerative disc disease), lumbar 10/19/2018   Preventative health care 10/19/2018   Prediabetes 10/19/2018   (HFpEF) heart failure with preserved ejection fraction (HCC) 12/12/2017   Pericardial effusion with cardiac tamponade 11/13/2017   Paroxysmal atrial fibrillation (HCC) 11/05/2017   Acute cough 05/30/2017   Anxiety 03/21/2016   Insomnia 07/17/2015   MI (mitral incompetence) 12/10/2014   Major depressive disorder 12/09/2014   OSA (obstructive sleep apnea) 12/09/2014   GERD (gastroesophageal reflux disease) 12/09/2014   Chronic diastolic CHF (congestive heart failure) (HCC)    HLD (hyperlipidemia)    Osteoarthritis of both knees    S/P left TKA 08/19/2014   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 (BMI) 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 (HCC) 04/17/2013   History of ductal carcinoma in situ (DCIS) of breast 11/05/2012   Essential hypertension, benign 11/05/2012    ONSET DATE: 02/14/2023 referral  REFERRING DIAG: Z86.73 (ICD-10-CM) - History of CVA (cerebrovascular accident) R53.81 (ICD-10-CM) - Physical deconditioning  THERAPY DIAG:  Muscle weakness (generalized)  Other lack of coordination  Abnormal posture  Other abnormalities of gait and mobility  Difficulty in walking, not elsewhere classified  Rationale for Evaluation and Treatment: Rehabilitation  SUBJECTIVE:                                                                                                                                                                                             SUBJECTIVE STATEMENT: Patient reports doing well. Reports recent change in BP rx. Denies falls. Per patient, therapists at [facility] are massaging  her neck and helping her start to sit up straight. Per  patient, being seen by therapists at [facility] starting last week, seen for what she thinks is 2x a week.    Pt accompanied by: self   PERTINENT HISTORY: PD, CVA, Depression, arthritis, GERD, CA, CHF, HLD, HTN  PAIN:  Are you having pain? No and Yes: NPRS scale: 1/10 Pain location: Neck  Pain description: Achy, spasm   PRECAUTIONS: Fall and Other: R hemi  PATIENT GOALS: "I want to walk again"   OBJECTIVE:   DIAGNOSTIC FINDINGS: 01/13/23 MRI Brain IMPRESSION: 1. Suspected 5 mm early subacute infarct in the right precentral gyrus. 2. Severe chronic small vessel ischemic disease. New small chronic cortical infarcts in the left frontal and parietal lobes. 3. Progressive sinusitis.   TODAY'S TREATMENT:       GAIT: -standing in // bars:  -MinA to stand with B UE support   -R lateral weight shift maintained, likely due to R knee flexion contracture   -able to maintain static standing balance with U UE (CGA), and no UE support for ~5s with CGA  -ambulated 62ft in // bars with MinA + wc follow   -B knees blocked during stance phase for safety 9but no evidence of buckling -stood to RW with ModA + MinA   -able to progress to taking 4 steps with grossly MaxA + wc follow  -anterior ball roll outs in anticipation of standing up to facilitate anterior weight shift  PATIENT EDUCATION: Education details: continue HEP Person educated: Patient Education method: Explanation and Handouts Education comprehension: verbalized understanding and needs further education  HOME EXERCISE PROGRAM: Access Code: [email or web address] URL: [email or web address] Date: 04/26/2023 Prepared by: Merry Lofty  Exercises - Supine Gluteal Sets  - 1 x daily - 7 x weekly - 3 sets - 10 reps - Supine Hip Abduction  - 1 x daily - 7 x weekly - 3 sets - 10 reps - Supine Heel Slide  - 1 x daily - 7 x weekly - 3 sets - 10 reps - Supine Ankle Pumps  - 1 x daily  - 7 x weekly - 3 sets - 10 reps - Seated Scapular Retraction  - 1 x daily - 7 x weekly - 3 sets - 10 reps - Supine Hamstring Stretch with Caregiver  - 1 x daily - 7 x weekly - 3 sets - 30s hold - Seated Hamstring Stretch with Chair  - 1 x daily - 7 x weekly - 3 sets - 30s hold  GOALS: Goals reviewed with patient? Yes  SHORT TERM GOALS: Target date: 05/05/23  Pt will be independent with initial HEP for improved functional strength Baseline: to be provided Goal status: MET  2.  Patient will complete sit <> stand with LRAD and MaxA x1  Baseline: Total A, max A to total A in // bars Goal status: IN PROGRESS  3.  Patient will initiate gait training Baseline: non-ambulatory; initiated 9/18  Goal status: MET  4.  Patient will maintain static sitting balance at EOB for at least 30s with no more than ModA Baseline: to be assessed; CGA-min A (9/13) Goal status: MET   LONG TERM GOALS: Target date: 06/16/23  Pt will be independent with final HEP for improved functional strength Baseline: to be provided Goal status: INITIAL  Patient will complete sit <> stand with LRAD and Mod x1  Baseline: Total A Goal status: INITIAL  Patient will ambulate at least 69ft in // bars with no more than ModA + wc follow as necessary Baseline: MaxA x1ft + wc follow  Goal status: REVISED   Patient will maintain static sitting balance at EOB for at least 60s with no more than MinA Baseline: to be assessed; CGA-min A (9/13) Goal status: MET  ASSESSMENT:  CLINICAL IMPRESSION: Patient seen for skilled PT session with emphasis on gait tx. She was able to progress to short distance gait outside the // bars with wc follow for safety. Limited by R knee flexion contracture and retropulsion. She is responsive to verbal cues and tactile cues to encourage further anterior weight shift. She is progressing very well with gait training using a RW. Continue POC.    OBJECTIVE IMPAIRMENTS: cardiopulmonary status limiting  activity, decreased activity tolerance, decreased balance, decreased coordination, decreased endurance, decreased knowledge of condition, decreased knowledge of use of DME, decreased mobility, difficulty walking, decreased ROM, decreased strength, impaired perceived functional ability, impaired sensation, impaired tone, improper body mechanics, and postural dysfunction.   ACTIVITY LIMITATIONS: carrying, lifting, bending, sitting, standing, squatting, stairs, transfers, bed mobility, continence, bathing, toileting, dressing, self feeding, reach over head, hygiene/grooming, and locomotion level  PARTICIPATION LIMITATIONS: meal prep, cleaning, laundry, medication management, interpersonal relationship, shopping, and community activity  PERSONAL FACTORS: Age, Past/current experiences, Time since onset of injury/illness/exacerbation, Transportation, and 3+ comorbidities: see above  are also affecting patient's functional outcome.   REHAB POTENTIAL: Fair time since onset  CLINICAL DECISION MAKING: Evolving/moderate complexity  EVALUATION COMPLEXITY: Moderate  PLAN:  PT FREQUENCY: 2x/week  PT DURATION: 8 weeks  PLANNED INTERVENTIONS: Therapeutic exercises, Therapeutic activity, Neuromuscular re-education, Balance training, Gait training, Patient/Family education, Self Care, Joint mobilization, Stair training, Vestibular training, Visual/preceptual remediation/compensation, Orthotic/Fit training, DME instructions, Aquatic Therapy, Dry Needling, Manual therapy, and Re-evaluation  PLAN FOR NEXT SESSION: sitting balance, STS, transfers, gait in // bars vs RW, may ultimately need custom wc   Westley Foots, PT, DPT, CBIS 05/19/2023, 3:28 PM

## 2023-05-24 ENCOUNTER — Ambulatory Visit: Payer: Medicare (Managed Care)

## 2023-05-24 ENCOUNTER — Encounter: Payer: Medicare (Managed Care) | Admitting: Occupational Therapy

## 2023-05-26 ENCOUNTER — Ambulatory Visit: Payer: Medicare (Managed Care)

## 2023-05-26 DIAGNOSIS — M6281 Muscle weakness (generalized): Secondary | ICD-10-CM

## 2023-05-26 DIAGNOSIS — R278 Other lack of coordination: Secondary | ICD-10-CM

## 2023-05-26 DIAGNOSIS — R293 Abnormal posture: Secondary | ICD-10-CM

## 2023-05-26 DIAGNOSIS — R262 Difficulty in walking, not elsewhere classified: Secondary | ICD-10-CM

## 2023-05-26 DIAGNOSIS — R471 Dysarthria and anarthria: Secondary | ICD-10-CM | POA: Diagnosis not present

## 2023-05-26 DIAGNOSIS — R2689 Other abnormalities of gait and mobility: Secondary | ICD-10-CM

## 2023-05-26 NOTE — Therapy (Signed)
OUTPATIENT PHYSICAL THERAPY NEURO TREATMENT   Patient Name: Selena Bush MRN: [id number] DOB:08-29-1945, 77 y.o., female Today's Date: 05/26/2023   PCP: Marny Lowenstein, MD REFERRING PROVIDER: Gillian Shields, NP  END OF SESSION:  PT End of Session - 05/26/23 1301     Visit Number 8    Number of Visits 17    Date for PT Re-Evaluation 06/16/23    Authorization Type PACE    PT Start Time 1315    PT Stop Time 1400    PT Time Calculation (min) 45 min    Activity Tolerance Patient tolerated treatment well    Behavior During Therapy [facility] for tasks assessed/performed               Past Medical History:  Diagnosis Date   Anxiety    Atrial fibrillation (HCC)    Cat bite of right hand 03/25/2019   Chronic diastolic CHF (congestive heart failure) (HCC)    a. echo 09/2014: EF 60-65%, diastolic dysfunction, mild LVH, nl RV size & systolic function, mildly dilated LA (4.3 cm), mild MR/TR, mildly elevated PASP 36.7 mm Hg   DDD (degenerative disc disease), cervical    DDD (degenerative disc disease), lumbar    Depression    Diffuse cystic mastopathy 2014   Dyspnea    Dysrhythmia    GERD (gastroesophageal reflux disease)    Gout    Headache    rare   HLD (hyperlipidemia)    a. statin intolerant 2/2 myalgias   Hx of dysplastic nevus 12/25/2018   L medial ankle   Hypercholesterolemia    Hypertension    Malignant neoplasm of upper-outer quadrant of female breast (HCC) 10/2012   Papillary DCIS, sentinel node negative. DR/PR positive. PARTIAL RIGHT MASTECTOMY FOR BREAST CANCER--HAD RADIATION - NO CHEMO --DR. CRYSTAL [place] ONCOLOGIST   Obesity    OSA on CPAP    Osteoarthritis of both knees    a. s/p right TKA 04/2013 & left TKA 09/2014   Otitis externa    Parkinson disease [facility])    Personal history of radiation therapy 2015   RIGHT breast-mammosite per pt   Pneumonia of both lungs due to infectious organism 01/08/2021   Sleep difficulties    LUNESTA HAS HELPED   Vaginal  cyst    Past Surgical History:  Procedure Laterality Date   ABDOMINAL HYSTERECTOMY  1992   DUB; fibroids; endometriosis.  One remaining ovary.     BREAST LUMPECTOMY Right 2015   Papillary DCIS, sentinel node negative. DR/PR positive. PARTIAL RIGHT MASTECTOMY FOR BREAST CANCER--HAD RADIATION - NO CHEMO --DR. CRYSTAL  ONCOLOGIST   BREAST SURGERY Right 10/2012   Wide excision,APB RT 10 mm papillary DCIS, ER/PR positive. Sentinel node negative. Partial breast radiation.   CATARACT EXTRACTION, BILATERAL  02/13/2016   [facility].   CHOLECYSTECTOMY  1994   COLONOSCOPY  2015   1 benign polyp-every 5 years/ Dr Bluford Kaufmann   COLONOSCOPY WITH PROPOFOL N/A 02/10/2021   Procedure: COLONOSCOPY WITH PROPOFOL;  Surgeon: Earline Mayotte, MD;  Location: [facility] ENDOSCOPY;  Service: Endoscopy;  Laterality: N/A;   ERCP  1995   JOINT REPLACEMENT Right 04/2013   knee   PERICARDIOCENTESIS N/A 11/13/2017   Procedure: PERICARDIOCENTESIS;  Surgeon: Yvonne Kendall, MD;  Location: [facility] INVASIVE CV LAB;  Service: Cardiovascular;  Laterality: N/A;   TOTAL KNEE ARTHROPLASTY Right 04/16/2013   Procedure: RIGHT TOTAL KNEE ARTHROPLASTY;  Surgeon: Shelda Pal, MD;  Location: [facility] ORS;  Service: Orthopedics;  Laterality: Right;   TOTAL  KNEE ARTHROPLASTY Left 09/15/2014   Procedure: LEFT TOTAL KNEE ARTHROPLASTY;  Surgeon: Shelda Pal, MD;  Location: [facility] ORS;  Service: Orthopedics;  Laterality: Left;   TUBAL LIGATION  1979   UPPER GI ENDOSCOPY     Patient Active Problem List   Diagnosis Date Noted   Cerebral vascular disease 10/10/2022   Gait abnormality 07/11/2022   Pressure injury of skin 06/05/2022   Rib fracture 06/04/2022   Stage 3a chronic kidney disease (CKD) (HCC) 06/04/2022   Skin ulcer with fat layer exposed (HCC) 03/16/2022   Vitamin B 12 deficiency 01/27/2022   Malaise 01/27/2022   Fever and chills 01/27/2022   Wheezing 01/27/2022   Vitamin D deficiency 01/27/2022   Hallucinations 11/24/2021    Abdominal pain 11/12/2021   Falls frequently 03/24/2021   Difficulty walking 04/04/2020   Hypophonia 01/15/2020   Numbness 12/28/2019   Left arm weakness 12/09/2019   Tremor 08/26/2019   Parkinsonism (HCC) 07/29/2019   DDD (degenerative disc disease), lumbar 10/19/2018   Preventative health care 10/19/2018   Prediabetes 10/19/2018   (HFpEF) heart failure with preserved ejection fraction (HCC) 12/12/2017   Pericardial effusion with cardiac tamponade 11/13/2017   Paroxysmal atrial fibrillation (HCC) 11/05/2017   Acute cough 05/30/2017   Anxiety 03/21/2016   Insomnia 07/17/2015   MI (mitral incompetence) 12/10/2014   Major depressive disorder 12/09/2014   OSA (obstructive sleep apnea) 12/09/2014   GERD (gastroesophageal reflux disease) 12/09/2014   Chronic diastolic CHF (congestive heart failure) (HCC)    HLD (hyperlipidemia)    Osteoarthritis of both knees    S/P left TKA 08/19/2014   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 (BMI) 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 (HCC) 04/17/2013   History of ductal carcinoma in situ (DCIS) of breast 11/05/2012   Essential hypertension, benign 11/05/2012    ONSET DATE: 02/14/2023 referral  REFERRING DIAG: Z86.73 (ICD-10-CM) - History of CVA (cerebrovascular accident) R53.81 (ICD-10-CM) - Physical deconditioning  THERAPY DIAG:  Muscle weakness (generalized)  Other lack of coordination  Abnormal posture  Other abnormalities of gait and mobility  Difficulty in walking, not elsewhere classified  Rationale for Evaluation and Treatment: Rehabilitation  SUBJECTIVE:                                                                                                                                                                                             SUBJECTIVE STATEMENT: Patient reports doing well. Does still have hematoma to L lateral distal LE, per patient- on abx for cellulitis with this. Reports improving strength in R LE. Denies falls.     Pt accompanied by: self  PERTINENT HISTORY: PD, CVA, Depression, arthritis, GERD, CA, CHF, HLD, HTN  PAIN:  Are you having pain? No and Yes: NPRS scale: 1/10 Pain location: Neck  Pain description: Achy, spasm   PRECAUTIONS: Fall and Other: R hemi  PATIENT GOALS: "I want to walk again"   OBJECTIVE:   DIAGNOSTIC FINDINGS: 01/13/23 MRI Brain IMPRESSION: 1. Suspected 5 mm early subacute infarct in the right precentral gyrus. 2. Severe chronic small vessel ischemic disease. New small chronic cortical infarcts in the left frontal and parietal lobes. 3. Progressive sinusitis.   TODAY'S TREATMENT:       Cardio Status:  Functional Limitations:   [x] Intact  []  Impaired      Respiratory Status:  Functional Limitations:   [x] Intact  [] Impaired   [] SOB [] COPD [] O2 Dependent ______LPM  [] Ventilator Dependent  Resp equip:                                                     Objective Measure(s):   Orthotics:   [] Amputee:                                                             [] Prosthesis:    HOME ENVIRONMENT:  [] House [x] Condo/town home [] Apartment [] Asst living [] LTCF         [x] Own  [] Rent   [] Lives alone [x] Lives with others -       [name], 1 dog, 2 cats         Hours without assistance: 0  [x] Home is accessible to patient                                 Storage of wheelchair:  [x] In home   [] Other Comments:       COMMUNITY :  TRANSPORTATION:  [] Car [] Barrister's clerk [] Adapted w/c Lift []  Ambulance [] Other:                     [x] Sits in wheelchair during transport   Where is w/c stored during transport?  [x] [place] [place]  []  [employer]  r   [] Self-Driver       Drive while in  Biomedical scientist [] yes [x] no   Employment and/or school:  Specific requirements pertaining to mobility        Other:  COMMUNICATION:  Verbal Communication  [x] WFL [] receptive [x] WFL [] expressive [] Understandable  [] Difficult to understand  [] non-communicative  Primary  Language:___english___________ 2nd:_____________  Communication provided by:[x] Patient [] Family [] Caregiver [] Translator   [] Uses an augmentative communication device     Manufacturer/Model :     MOBILITY/BALANCE:  Sitting Balance  Standing Balance  Transfers  Ambulation   [] WFL      [] WFL  [] Independent  []  Independent   [x] Uses UE for balance in sitting Comments:  [] Uses UE/device for stability Comments:  []  Min assist  []  Ambulates independently with       device:___________________      []  Mod assist  []  Able to ambulate ______ feet        safely/functionally/independently   []  Min assist  []  Min assist  [x]   Max assist  []  Non-functional ambulator         History/High risk of falls   []  Mod assist  []  Mod assist  [x]  Dependent  [x]  Unable to ambulate   []  Max  assist  []  Max assist  Transfer method:[] 1 person [] 2 person [] sliding board [] squat pivot [] stand pivot [x] mechanical patient lift  [] other:   []  Unable  [x]  Unable    Fall History: # of falls in the past 6 months? 1; when CVA occurred # of "near" falls in the past 6 months? 0    CURRENT SEATING / MOBILITY:  Current Mobility Device: [] None [] Cane/Walker [x] Manual [] Dependent [] Dependent w/ Tilt rScooter  [] Power (type of control):   Manufacturer:  Model: standard Serial #:   Size: 20x18 Color:  Age:   Purchased by whom:   Current condition of mobility base:    Current seating system:                                                                       Age of seating system:    Describe posture in present seating system: R lateral lean, posterior pelvic tilt, B shoulder internal rotation, Cervical flexion, R cervical rotation, R cervical lateral flexion   Is the current mobility meeting medical necessity?:  [] Yes [x] No Describe: significantly flexed posture, unable to perform any pressure relief strategies                    Ability to complete Mobility-Related Activities of Daily Living (MRADL's) with Current Mobility  Device:   Move room to room  [] Independent  [] Min [] Mod [] Max assist  [x] Unable  Comments:   Meal prep  [] Independent  [] Min [] Mod [] Max assist  [x] Unable    Feeding  [x] Independent  [] Min [] Mod [] Max assist  [] Unable    Bathing  [] Independent  [] Min [] Mod [x] Max assist  [] Unable    Grooming  [] Independent  [] Min [x] Mod [] Max assist  [] Unable    UE dressing  [] Independent  [] Min [] Mod [x] Max assist  [] Unable    LE dressing  [] Independent   [] Min [] Mod [x] Max assist  [] Unable    Toileting  [] Independent  [] Min [] Mod [x] Max assist  [] Unable    Bowel Mgt: [x]  Continent []  Incontinent []  Accidents [x]  Diapers []  Colostomy []  Bowel Program:  Bladder Mgt: [x]  Continent []  Incontinent []  Accidents [x]  Diapers []  Urinal []  Intermittent Cath []  Indwelling Cath []  Supra-pubic Cath     Current Mobility Equipment Trialed/ Ruled Out:    Does not meet mobility needs due to:    Mark all boxes that indicate inability to use the specific equipment listed     Meets needs for safe  independent functional  ambulation  / mobility    Risk of  Falling or History of Falls    Enviromental limitations      Cognition    Safety concerns with  physical ability    Decreased / limitations endurance  & strength     Decreased / limitations  motor skills  & coordination    Pain    Pace /  Speed    Cardiac and/or  respiratory condition    Contra - indicated by diagnosis   Cane/Crutches  []   [  x]  []   []   [x]   [x]   [x]   [x]   [x]   []   []    Walker / Rollator  []  NA   []   [x]   []   []   [x]   [x]   [x]   [x]   [x]   []   []     Manual Wheelchair [id number]:  []  NA  []   []   []   []   [x]   [x]   [x]   [x]   [x]   []   []    Manual W/C (K0005) with power assist  [x]  NA  []   []   []   []   []   []   []   []   []   []   []    Scooter  []  NA  []   [x]   [x]   []   [x]   []   []   []   []   []   []    Power Wheelchair: standard joystick  []  NA  []   []   []   []   []   []   []   []   []   []   []    Power Wheelchair: alternative controls  []  NA  [x]   []   []   []    []   []   []   []   []   []   []    Summary:  The least costly alternative for independent functional mobility was found to be:    []  Crutch/Cane  []  Walker [x]  Manual w/c  []  Manual w/c with power assist   []  Scooter   []  Power w/c std joystick   [x]  Power w/c alternative control        [x]  Requires dependent care mobility device   Cabin crew for [employer] skills are adequate for safe mobility equipment operation  [x]   Yes []   No  Patient is willing and motivated to use recommended mobility equipment  [x]   Yes []   No       [x]  Patient is unable to safely operate mobility equipment independently and requires dependent care equipment Comments:           SENSATION and SKIN ISSUES:  Sensation [x]  Intact  []  Impaired []  Absent []  Hyposensate []  Hypersensate  []  Defensiveness  Location(s) of impairment:    Pressure Relief Method(s):  []  Lean side to side to offload (without risk of falling)  []   W/C push up (4+ times/hour for 15+ seconds) []  Stand up (without risk of falling)    []  Other: (Describe): Effective pressure relief method(s) above can be performed consistently throughout the day: [] Yes  [x]  No If not, Why?: unable to safely due so without risk of falling and due to significant strength deficits.   Skin Integrity Risk:       []  Low risk           []  Moderate risk            [x]  High risk  If high risk, explain: previous/current pressure injuries, dependent for transfers to toilet and so risk for maceration of skin, and unable to complete pressure relief strategies independently   Skin Issues/Skin Integrity  Current skin Issues  [x]  Yes []  No []  Intact  [x]   Red area   [x]   Open area  []  Scar tissue  []  At risk from prolonged sitting  Where: B ischial tuberosities, coccyx, L lateral shin History of Skin Issues  []  Yes [x]  No Where : When: Stage: Hx of skin flap surgeries  [x]  Yes []  No Where:  When:  Pain: []  Yes [x]  No   Pain Location(s):   Intensity  scale: (0-10) : How does pain interfere with mobility and/or MRADLs? -    MAT EVALUATION:  Neuro-Muscular Status: (Tone, Reflexive, Responses, etc.)     []   Intact   [x]  Spasticity:  []  Hypotonicity  []  Fluctuating  [x]  Muscle Spasms  [x]  Poor Righting Reactions/Poor Equilibrium Reactions  []  Primal Reflex(s):    Comments:            COMMENTS:    POSTURE:     Comments:  Pelvis Anterior/Posterior:  []  Neutral   [x]  Posterior  []  Anterior  []  Fixed - No movement [x]  Tendency away from neutral []  Flexible []  Self-correction [x]  External correction Obliquity (viewed from front)  [x]  WFL []  R Obliquity []  L Obliquity  []  Fixed - No movement []  Tendency away from neutral []  Flexible []  Self-correction []  External correction Rotation  [x]  WFL []  R anterior []  L anterior  []  Fixed - No movement []  Tendency away from neutral []  Flexible []  Self-correction []  External correction Tonal Influence Pelvis:  []  Normal []  Flaccid []  Low tone []  Spasticity [x]  Dystonia []  Pelvis thrust []  Other:    Trunk Anterior/Posterior:  []  WFL [x]  Thoracic kyphosis []  Lumbar lordosis  [x]  Fixed - No movement []  Tendency away from neutral []  Flexible []  Self-correction []  External correction  []  WFL []  Convex to left  [x]  Convex to right []  S-curve   [x]  C-curve []  Multiple curves []  Tendency away from neutral []  Flexible []  Self-correction [x]  External correction Rotation of shoulders and upper trunk:  []  Neutral []  Left-anterior [x]  Right- anterior []  Fixed- no movement [x]  Tendency away from neutral []  Flexible []  Self correction [x]  External correction Tonal influence Trunk:  []  Normal []  Flaccid []  Low tone []  Spasticity [x]  Dystonia []  Other:   Head & Neck  []  Functional [x]  Flexed    []  Extended [x]  Rotated right  []  Rotated left [x]  Laterally flexed right []  Laterally flexed left []  Cervical hyperextension   []  Good head  control []  Adequate head control [x]  Limited head control []  Absent head control Describe tone/movement of head and neck:      Lower Extremity Measurements: LE ROM:  Active ROM Right 05/26/2023 Left 05/26/2023  Hip flexion    Hip extension    Hip abduction    Hip adduction    Knee flexion 111* 97*  Knee extension -28* -18*  Ankle dorsiflexion    Ankle plantarflexion     (Blank rows = not tested)  LE MMT:  MMT Right 05/26/2023 Left 05/26/2023  Hip flexion 2- 2+  Hip extension    Hip abduction 3 3  Hip adduction 3 3  Knee flexion 2+ 2+  Knee extension 2 2+  Ankle dorsiflexion 2 3  Ankle plantarflexion     (Blank rows = not tested)  Hip positions:  [x]  Neutral   []  Abducted   []  Adducted  []  Subluxed   []  Dislocated   []  Fixed   []  Tendency away from neutral []  Flexible []  Self-correction []  External correction   Hip Windswept:[x]  Neutral  []  Right    []  Left  []  Subluxed   []  Dislocated   []  Fixed   []  Tendency away from neutral []  Flexible []  Self-correction []  External correction  LE Tone: []  Normal []  Low tone [x]  Spasticity []  Flaccid []  Dystonia []  Rocks/Extends at hip []  Thrust into knee extension []  Pushes legs downward into footrest  Foot positioning: ROM Concerns: Dorsiflexed: []  Right   []  Left  Plantar flexed: []  Right    []  Left Inversion: []  Right    []  Left Eversion: [x]  Right    []  Left  LE Edema: [x]  1+ (Barely detectable impression when finger is pressed into skin) []  2+ (slight indentation. 15 seconds to rebound) []  3+ (deeper indentation. 30 seconds to rebound) []  4+ (>30 seconds to rebound)  UE Measurements:  UPPER EXTREMITY ROM:   Active ROM Right 05/26/2023 Left 05/26/2023  Shoulder flexion 70* 75*  Shoulder abduction    Shoulder adduction    Elbow flexion    Elbow extension    Wrist flexion    Wrist extension    (Blank rows = not tested)  UPPER EXTREMITY MMT:  MMT Right 05/26/2023 Left 05/26/2023   Shoulder flexion 2 2  Shoulder abduction 2 2  Shoulder adduction 2 2  Elbow flexion 2 2  Elbow extension 2 2  Wrist flexion 2 2  Wrist extension 2 2  Pinch strength    Grip strength    (Blank rows = not tested)  Shoulder Posture:  Right Tendency towards Left  []   Functional []    []   Elevation []    [x]   Depression [x]    [x]   Protraction [x]    []   Retraction []    [x]   Internal rotation [x]    []   External rotation []    []   Subluxed []     UE Tone: [x]  Normal []  Flaccid []  Low tone []  Spasticity  []  Dystonia []  Other:   UE Edema: []  1+ (Barely detectable impression when finger is pressed into skin) []  2+ (slight indentation. 15 seconds to rebound) []  3+ (deeper indentation. 30 seconds to rebound) []  4+ (>30 seconds to rebound)  Wrist/Hand: Handedness: [x]  Right   []  Left   []  NA: Comments:  Right  Left  []   WNL []    []   Limitations []    []   Contractures []    []   Fisting []    [x]   Tremors [x]    [x]   Weak grasp [x]    [x]   Poor dexterity [x]    []   Hand movement non functional []    []   Paralysis []      PATIENT EDUCATION: Education details: continue HEP Person educated: Patient Education method: Explanation and Handouts Education comprehension: verbalized understanding and needs further education  HOME EXERCISE PROGRAM: Access Code: [email or web address] URL: [email or web address] Date: 04/26/2023 Prepared by: Merry Lofty  Exercises - Supine Gluteal Sets  - 1 x daily - 7 x weekly - 3 sets - 10 reps - Supine Hip Abduction  - 1 x daily - 7 x weekly - 3 sets - 10 reps - Supine Heel Slide  - 1 x daily - 7 x weekly - 3 sets - 10 reps - Supine Ankle Pumps  - 1 x daily - 7 x weekly - 3 sets - 10 reps - Seated Scapular Retraction  - 1 x daily - 7 x weekly - 3 sets - 10 reps - Supine Hamstring Stretch with Caregiver  - 1 x daily - 7 x weekly - 3 sets - 30s hold - Seated Hamstring Stretch with Chair  - 1 x daily - 7 x weekly - 3 sets - 30s hold  GOALS: Goals  reviewed with patient? Yes  SHORT TERM GOALS: Target date: 05/05/23  Pt will be independent with initial HEP for improved functional strength Baseline: to be provided Goal status: MET  2.  Patient will complete sit <> stand with LRAD and MaxA x1  Baseline: Total A,  max A to total A in // bars Goal status: IN PROGRESS  3.  Patient will initiate gait training Baseline: non-ambulatory; initiated 9/18  Goal status: MET  4.  Patient will maintain static sitting balance at EOB for at least 30s with no more than ModA Baseline: to be assessed; CGA-min A (9/13) Goal status: MET   LONG TERM GOALS: Target date: 06/16/23  Pt will be independent with final HEP for improved functional strength Baseline: to be provided Goal status: INITIAL  Patient will complete sit <> stand with LRAD and Mod x1  Baseline: Total A Goal status: INITIAL  Patient will ambulate at least 61ft in // bars with no more than ModA + wc follow as necessary Baseline: MaxA x62ft + wc follow Goal status: REVISED   Patient will maintain static sitting balance at EOB for at least 60s with no more than MinA Baseline: to be assessed; CGA-min A (9/13) Goal status: MET  ASSESSMENT:  CLINICAL IMPRESSION: Patient seen for skilled PT session with emphasis on initial WC eval. She would benefit from a group 3 power wheelchair with tilt and recline features to allow for INDEPENDENT pressure relief to prevent progression of current pressure injuries and prevent development of additional pressure injuries. With modified joystick, patient would be able to maneuver the wc herself, but would highly benefit from caregiver control access to aid in safety and tight maneuverability. ATP will meet with patient separately before this PT submits the final LMN. Continue POC.    OBJECTIVE IMPAIRMENTS: cardiopulmonary status limiting activity, decreased activity tolerance, decreased balance, decreased coordination, decreased endurance, decreased  knowledge of condition, decreased knowledge of use of DME, decreased mobility, difficulty walking, decreased ROM, decreased strength, impaired perceived functional ability, impaired sensation, impaired tone, improper body mechanics, and postural dysfunction.   ACTIVITY LIMITATIONS: carrying, lifting, bending, sitting, standing, squatting, stairs, transfers, bed mobility, continence, bathing, toileting, dressing, self feeding, reach over head, hygiene/grooming, and locomotion level  PARTICIPATION LIMITATIONS: meal prep, cleaning, laundry, medication management, interpersonal relationship, shopping, and community activity  PERSONAL FACTORS: Age, Past/current experiences, Time since onset of injury/illness/exacerbation, Transportation, and 3+ comorbidities: see above  are also affecting patient's functional outcome.   REHAB POTENTIAL: Fair time since onset  CLINICAL DECISION MAKING: Evolving/moderate complexity  EVALUATION COMPLEXITY: Moderate  PLAN:  PT FREQUENCY: 2x/week  PT DURATION: 8 weeks  PLANNED INTERVENTIONS: Therapeutic exercises, Therapeutic activity, Neuromuscular re-education, Balance training, Gait training, Patient/Family education, Self Care, Joint mobilization, Stair training, Vestibular training, Visual/preceptual remediation/compensation, Orthotic/Fit training, DME instructions, Aquatic Therapy, Dry Needling, Manual therapy, and Re-evaluation  PLAN FOR NEXT SESSION: sitting balance, STS, transfers, gait in // bars vs RW  Westley Foots, PT, DPT, CBIS 05/26/2023, 4:00 PM

## 2023-05-31 ENCOUNTER — Ambulatory Visit: Payer: Medicare (Managed Care) | Admitting: Occupational Therapy

## 2023-05-31 ENCOUNTER — Ambulatory Visit: Payer: Medicare (Managed Care) | Admitting: Physical Therapy

## 2023-05-31 DIAGNOSIS — R2689 Other abnormalities of gait and mobility: Secondary | ICD-10-CM

## 2023-05-31 DIAGNOSIS — R278 Other lack of coordination: Secondary | ICD-10-CM

## 2023-05-31 DIAGNOSIS — R293 Abnormal posture: Secondary | ICD-10-CM

## 2023-05-31 DIAGNOSIS — M6281 Muscle weakness (generalized): Secondary | ICD-10-CM

## 2023-05-31 DIAGNOSIS — R471 Dysarthria and anarthria: Secondary | ICD-10-CM | POA: Diagnosis not present

## 2023-05-31 NOTE — Therapy (Signed)
OUTPATIENT OCCUPATIONAL THERAPY NEURO TREATMENT  Patient Name: Selena Bush MRN: [id number] DOB:05-10-1946, 77 y.o., female Today's Date: 05/31/2023  PCP: Jethro Bastos, MD  REFERRING PROVIDER: Alver Sorrow, NP  END OF SESSION:  OT End of Session - 05/31/23 1316     Visit Number 3    Number of Visits 17    Date for OT Re-Evaluation 06/28/23    Authorization Type [employer]    OT Start Time 1250    OT Stop Time 1316    OT Time Calculation (min) 26 min    Equipment Utilized During Treatment Personal WC    Activity Tolerance Patient tolerated treatment well    Behavior During Therapy WFL for tasks assessed/performed             Past Medical History:  Diagnosis Date   Anxiety    Atrial fibrillation (HCC)    Cat bite of right hand 03/25/2019   Chronic diastolic CHF (congestive heart failure) (HCC)    a. echo 09/2014: EF 60-65%, diastolic dysfunction, mild LVH, nl RV size & systolic function, mildly dilated LA (4.3 cm), mild MR/TR, mildly elevated PASP 36.7 mm Hg   DDD (degenerative disc disease), cervical    DDD (degenerative disc disease), lumbar    Depression    Diffuse cystic mastopathy 2014   Dyspnea    Dysrhythmia    GERD (gastroesophageal reflux disease)    Gout    Headache    rare   HLD (hyperlipidemia)    a. statin intolerant 2/2 myalgias   Hx of dysplastic nevus 12/25/2018   L medial ankle   Hypercholesterolemia    Hypertension    Malignant neoplasm of upper-outer quadrant of female breast (HCC) 10/2012   Papillary DCIS, sentinel node negative. DR/PR positive. PARTIAL RIGHT MASTECTOMY FOR BREAST CANCER--HAD RADIATION - NO CHEMO --DR. CRYSTAL [place] ONCOLOGIST   Obesity    OSA on CPAP    Osteoarthritis of both knees    a. s/p right TKA 04/2013 & left TKA 09/2014   Otitis externa    Parkinson disease [facility])    Personal history of radiation therapy 2015   RIGHT breast-mammosite per pt   Pneumonia of both lungs due to infectious organism  01/08/2021   Sleep difficulties    LUNESTA HAS HELPED   Vaginal cyst    Past Surgical History:  Procedure Laterality Date   ABDOMINAL HYSTERECTOMY  1992   DUB; fibroids; endometriosis.  One remaining ovary.     BREAST LUMPECTOMY Right 2015   Papillary DCIS, sentinel node negative. DR/PR positive. PARTIAL RIGHT MASTECTOMY FOR BREAST CANCER--HAD RADIATION - NO CHEMO --DR. CRYSTAL [place] ONCOLOGIST   BREAST SURGERY Right 10/2012   Wide excision,APB RT 10 mm papillary DCIS, ER/PR positive. Sentinel node negative. Partial breast radiation.   CATARACT EXTRACTION, BILATERAL  02/13/2016   [facility].   CHOLECYSTECTOMY  1994   COLONOSCOPY  2015   1 benign polyp-every 5 years/ Dr Bluford Kaufmann   COLONOSCOPY WITH PROPOFOL N/A 02/10/2021   Procedure: COLONOSCOPY WITH PROPOFOL;  Surgeon: Earline Mayotte, MD;  Location: [facility] ENDOSCOPY;  Service: Endoscopy;  Laterality: N/A;   ERCP  1995   JOINT REPLACEMENT Right 04/2013   knee   PERICARDIOCENTESIS N/A 11/13/2017   Procedure: PERICARDIOCENTESIS;  Surgeon: Yvonne Kendall, MD;  Location: [facility] INVASIVE CV LAB;  Service: Cardiovascular;  Laterality: N/A;   TOTAL KNEE ARTHROPLASTY Right 04/16/2013   Procedure: RIGHT TOTAL KNEE ARTHROPLASTY;  Surgeon: Shelda Pal, MD;  Location: [facility];  Service: Orthopedics;  Laterality: Right;   TOTAL KNEE ARTHROPLASTY Left 09/15/2014   Procedure: LEFT TOTAL KNEE ARTHROPLASTY;  Surgeon: Shelda Pal, MD;  Location: [facility] ORS;  Service: Orthopedics;  Laterality: Left;   TUBAL LIGATION  1979   UPPER GI ENDOSCOPY     Patient Active Problem List   Diagnosis Date Noted   Cerebral vascular disease 10/10/2022   Gait abnormality 07/11/2022   Pressure injury of skin 06/05/2022   Rib fracture 06/04/2022   Stage 3a chronic kidney disease (CKD) (HCC) 06/04/2022   Skin ulcer with fat layer exposed (HCC) 03/16/2022   Vitamin B 12 deficiency 01/27/2022   Malaise 01/27/2022   Fever and chills 01/27/2022   Wheezing 01/27/2022    Vitamin D deficiency 01/27/2022   Hallucinations 11/24/2021   Abdominal pain 11/12/2021   Falls frequently 03/24/2021   Difficulty walking 04/04/2020   Hypophonia 01/15/2020   Numbness 12/28/2019   Left arm weakness 12/09/2019   Tremor 08/26/2019   Parkinsonism (HCC) 07/29/2019   DDD (degenerative disc disease), lumbar 10/19/2018   Preventative health care 10/19/2018   Prediabetes 10/19/2018   (HFpEF) heart failure with preserved ejection fraction (HCC) 12/12/2017   Pericardial effusion with cardiac tamponade 11/13/2017   Paroxysmal atrial fibrillation (HCC) 11/05/2017   Acute cough 05/30/2017   Anxiety 03/21/2016   Insomnia 07/17/2015   MI (mitral incompetence) 12/10/2014   Major depressive disorder 12/09/2014   OSA (obstructive sleep apnea) 12/09/2014   GERD (gastroesophageal reflux disease) 12/09/2014   Chronic diastolic CHF (congestive heart failure) (HCC)    HLD (hyperlipidemia)    Osteoarthritis of both knees    S/P left TKA 08/19/2014   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 (BMI) 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 (HCC) 04/17/2013   History of ductal carcinoma in situ (DCIS) of breast 11/05/2012   Essential hypertension, benign 11/05/2012   ONSET DATE: 04/06/2023 (date of referral)  REFERRING DIAG: Z86.73 (ICD-10-CM) - History of CVA  THERAPY DIAG:  Abnormal posture  Other lack of coordination  Muscle weakness (generalized)  Rationale for Evaluation and Treatment: Rehabilitation  SUBJECTIVE:   SUBJECTIVE STATEMENT: Pt arrived late today and session was short.  She had some bruising on L hand but did not report any new issues to explain dark appearance of hand.   Pt accompanied by: self  PERTINENT HISTORY: PD, CVA, Depression, arthritis, GERD, CA, CHF, HLD, HTN   PRECAUTIONS: Fall  WEIGHT BEARING RESTRICTIONS: No  PAIN:  Are you having pain? Yes: NPRS scale: 0/10 Pain location: neck Pain description: hurts Aggravating factors: posture Relieving  factors: Biofreeze  FALLS: Has patient fallen in last 6 months? Yes. Number of falls fell from her stroke  LIVING ENVIRONMENT: Lives with:  caregiver Lives in: House/apartment Stairs:  ramped entrance Has following equipment at home: Single point cane, Walker - 2 wheeled, Environmental consultant - 4 wheeled, Wheelchair (manual), shower chair, bed side commode, Ramped entry, and [employer] lift   PLOF: Needs assistance with ADLs, Needs assistance with homemaking, Needs assistance with gait, and Needs assistance with transfers  PATIENT GOALS: improve use of BUEs  OBJECTIVE: (from evaluation unless otherwise noted)  05/17/2023: Multiple skin tears to RUE; 2 bandaged -1 proximal forearm and 1 on dorsal R hand; mid forearm tear in outline of rosary bracelet; dried blood on rolled up sleeve Cyanotic skin of L hand, which is cool to the touch, no improvement to color with gentle massage or positioning; pt has active movement; denies pain Feet sliding off foot  plates of wheelchair, repositioned flat on floor with indention on top of foot from where tongue of shoe rested against dorsiflexed foot; pt does not have socks donned; signs of skin break down on R foot though blanches L lower leg has hematoma laterally with erythema surrounding approximately 10-12" in length extending from just below knee to top of shoe and approximately 4-5" wide  HAND DOMINANCE: Right  ADLs: Overall ADLs: dependent  IADLs: Overall IADLs: dependent  MOBILITY STATUS:  wheelchair bound  POSTURE COMMENTS:  rounded shoulders, forward head, increased thoracic kyphosis, right pelvic obliquity, flexed trunk , and weight shift right   ACTIVITY TOLERANCE: Activity tolerance: fair  FUNCTIONAL OUTCOME MEASURES: PSFS: 0   Total score = sum of the activity scores/number of activities Minimum detectable change (90%CI) for average score = 2 points Minimum detectable change (90%CI) for single activity score = 3 points   UPPER EXTREMITY ROM:      AROM Right (eval) Left (eval)  Shoulder flexion 80 45  Shoulder abduction 45 45  Elbow flexion WNL WFL  Elbow extension -40 -30  Wrist flexion 60 62  Wrist extension 30 38  Wrist pronation WNL WNL  Wrist supination To neutral 10  Digit Composite Flexion Lacks 2 cm 2nd digit Lacks 1 cm 2nd digit  Digit Composite Extension WFL WNL  Digit Opposition WFL WFL  (Blank rows = not tested)  UPPER EXTREMITY MMT:     MMT Right (eval) Left (eval)  Shoulder flexion BFL BFL  Shoulder abduction BFL BFL  Elbow flexion [facility] WFL  Elbow extension WFL WFL  (Blank rows = not tested) Lacks endurance with testing.   HAND FUNCTION: Grip strength: Right: 21.8 lbs; Left: 25.5 lbs  COORDINATION: Box and Blocks:  Right 3blocks, Left 5blocks  SENSATION: WFL  EDEMA: none reported or observed  MUSCLE TONE: RUE: Moderate and Rigidity and LUE: Moderate and Rigidity  COGNITION: Overall cognitive status: Within functional limits for tasks assessed  VISION: To be assessed during future visit  PERCEPTION: Not tested  PRAXIS: Not tested  OBSERVATIONS: Pt requires assistance to propel wheelchair. Poor positioning within wheelchair pushing BUE into shoulder protraction, elbow flexion, shoulder adduction, and wrist flexion. Unable to look pt directly in the eyes given kyphotic and forward head posturing. Appears well kept. Glasses donned. Pt able to hold small sandwich in her R hand upon leaving clinic.   TODAY'S TREATMENT:                                                                                                                               Wheelchair management: OT assisted with improved positioning in WC with adjustment to lateral supports - especially on R side to decrease leaning into WC backrest cane by turing the wedge around to cover the upright post and then turing the L lateral horizontal to give something for her to lay her arm on.  OT then able to place her R foot better  on the  foot rest.   Self Care: Pt engaged in hand to mouth motions while holding foam roll to simulate self feeding with conversation re: appropriate foods to try and eat ie) applesauce with OT able to shoe pt alternate flavors to make applesauce more enjoyable, as she does use this to help with taking her medication.  Pt also trial hand ot mouth motion with elbow resting on padded surface for increased comfort and need further practice to get hand consistently to her mouth with utensil or cup correctly oriented.    PATIENT EDUCATION: Education details: Positioning and self feeding Person educated: Patient Education method: Explanation, Demonstration, and Tactile cues Education comprehension: verbalized understanding, returned demonstration, tactile cues required, and needs further education  HOME EXERCISE PROGRAM: N/A This date  GOALS:  SHORT TERM GOALS: Target date: 05/31/2023    Patient will demonstrate independence with initial BUE ROM HEP. Baseline: not yet initiated Goal status: IN PROGRESS  2.  Pt will be able to place at least 8 blocks using right hand with completion of Box and Blocks test.  Baseline: Right 3 blocks Goal status: IN Progress  3.  Pt will be able to place at least 10 blocks using left hand with completion of Box and Blocks test. Baseline: Left 5 blocks Goal status: IN Progress  4.  Pt will independently recall at least 3 positioning and pressure relief strategies as needed to prevent pressure injuries and contractures.  Baseline:  Goal status: INITIAL  5.  Pt will demonstrate full composite flexion bilaterally as needed to pick up and hold ADL items such as hair brush, tooth brush, eating utensils, etc.  Baseline:  Right  Left Lacks 2 cm 2nd digit Lacks 1 cm 2nd digit  Goal status: INITIAL  LONG TERM GOALS: Target date: 06/28/2023    Patient will demonstrate updated RUE and LUE HEP with 25% verbal cues or less for proper execution. Baseline: not yet  initiated Goal status: INITIAL  2.  Pt will verbalize understanding of ways to prevent future PD related complications. Baseline: not yet initiated Goal status: INITIAL  3.  Pt will be able to place at least 12 blocks using right hand with completion of Box and Blocks test.  Baseline: Right 3 blocks Goal status: INITIAL  4.  Pt will be able to place at least 14 blocks using left hand with completion of Box and Blocks test. Baseline: Left 5 blocks Goal status: INITIAL  5.  Patient will report at least two-point increase in average PSFS score or at least three-point increase in a single activity score indicating functionally significant improvement given minimum detectable change.  Baseline: 0 total score (See above for individual activity scores)  Baseline:  Goal status: INITIAL  ASSESSMENT:  CLINICAL IMPRESSION: Patient is a 77 y.o. female who was seen today for occupational therapy treatment for positioning needs and UE functional use. Patient currently presents with WC positioning needs as appropriate positioning in WC would make it eaiser to get her to engage in self feeding, decrease leaning to her R and improve head/neck position.  Pt will benefit from continued skilled OT services in the outpatient setting to work on impairments noted in functional UE use for ADLs and maximize use for self feeding or other functional UE tasks.  PERFORMANCE DEFICITS: in functional skills including ADLs, IADLs, coordination, tone, ROM, strength, Fine motor control, Gross motor control, mobility, body mechanics, decreased knowledge of use of DME, skin integrity, and UE functional use.  IMPAIRMENTS: are limiting patient from ADLs, IADLs, leisure, and social participation.   CO-MORBIDITIES: may have co-morbidities  that affects occupational performance. Patient will benefit from skilled OT to address above impairments and improve overall function.  REHAB POTENTIAL: Fair given chronicity of poor  posturing and   PLAN:  OT FREQUENCY: 2x/week  OT DURATION: 8 weeks  PLANNED INTERVENTIONS: self care/ADL training, therapeutic exercise, therapeutic activity, neuromuscular re-education, manual therapy, passive range of motion, functional mobility training, splinting, electrical stimulation, ultrasound, fluidotherapy, moist heat, patient/family education, visual/perceptual remediation/compensation, energy conservation, coping strategies training, DME and/or AE instructions, and Re-evaluation  RECOMMENDED OTHER SERVICES: none at this time. Pt is in process of obtaining w/c eval.   CONSULTED AND AGREED WITH PLAN OF CARE: Patient  PLAN FOR NEXT SESSION:  Review cup holding/drinking/self feeding;  review/establish HEP;  From previous sessions - check re: does pt have NMES?  Victorino Sparrow, OT 05/31/2023, 3:36 PM

## 2023-05-31 NOTE — Therapy (Signed)
OUTPATIENT PHYSICAL THERAPY NEURO TREATMENT   Patient Name: Selena Bush MRN: [id number] DOB:06-22-1946, 77 y.o., female Today's Date: 05/31/2023   PCP: Marny Lowenstein, MD REFERRING PROVIDER: Gillian Shields, NP  END OF SESSION:  PT End of Session - 05/31/23 1320     Visit Number 9    Number of Visits 17    Date for PT Re-Evaluation 06/16/23    Authorization Type PACE    PT Start Time 1318    PT Stop Time 1403    PT Time Calculation (min) 45 min    Equipment Utilized During Treatment Gait belt    Activity Tolerance Patient limited by fatigue    Behavior During Therapy WFL for tasks assessed/performed                Past Medical History:  Diagnosis Date   Anxiety    Atrial fibrillation (HCC)    Cat bite of right hand 03/25/2019   Chronic diastolic CHF (congestive heart failure) (HCC)    a. echo 09/2014: EF 60-65%, diastolic dysfunction, mild LVH, nl RV size & systolic function, mildly dilated LA (4.3 cm), mild MR/TR, mildly elevated PASP 36.7 mm Hg   DDD (degenerative disc disease), cervical    DDD (degenerative disc disease), lumbar    Depression    Diffuse cystic mastopathy 2014   Dyspnea    Dysrhythmia    GERD (gastroesophageal reflux disease)    Gout    Headache    rare   HLD (hyperlipidemia)    a. statin intolerant 2/2 myalgias   Hx of dysplastic nevus 12/25/2018   L medial ankle   Hypercholesterolemia    Hypertension    Malignant neoplasm of upper-outer quadrant of female breast (HCC) 10/2012   Papillary DCIS, sentinel node negative. DR/PR positive. PARTIAL RIGHT MASTECTOMY FOR BREAST CANCER--HAD RADIATION - NO CHEMO --DR. CRYSTAL [place] ONCOLOGIST   Obesity    OSA on CPAP    Osteoarthritis of both knees    a. s/p right TKA 04/2013 & left TKA 09/2014   Otitis externa    Parkinson disease [facility])    Personal history of radiation therapy 2015   RIGHT breast-mammosite per pt   Pneumonia of both lungs due to infectious organism 01/08/2021   Sleep  difficulties    LUNESTA HAS HELPED   Vaginal cyst    Past Surgical History:  Procedure Laterality Date   ABDOMINAL HYSTERECTOMY  1992   DUB; fibroids; endometriosis.  One remaining ovary.     BREAST LUMPECTOMY Right 2015   Papillary DCIS, sentinel node negative. DR/PR positive. PARTIAL RIGHT MASTECTOMY FOR BREAST CANCER--HAD RADIATION - NO CHEMO --DR. CRYSTAL [place] ONCOLOGIST   BREAST SURGERY Right 10/2012   Wide excision,APB RT 10 mm papillary DCIS, ER/PR positive. Sentinel node negative. Partial breast radiation.   CATARACT EXTRACTION, BILATERAL  02/13/2016   [facility]avis.   CHOLECYSTECTOMY  1994   COLONOSCOPY  2015   1 benign polyp-every 5 years/ Dr Bluford Kaufmann   COLONOSCOPY WITH PROPOFOL N/A 02/10/2021   Procedure: COLONOSCOPY WITH PROPOFOL;  Surgeon: Earline Mayotte, MD;  Location: [facility] ENDOSCOPY;  Service: Endoscopy;  Laterality: N/A;   ERCP  1995   JOINT REPLACEMENT Right 04/2013   knee   PERICARDIOCENTESIS N/A 11/13/2017   Procedure: PERICARDIOCENTESIS;  Surgeon: Yvonne Kendall, MD;  Location: [facility] INVASIVE CV LAB;  Service: Cardiovascular;  Laterality: N/A;   TOTAL KNEE ARTHROPLASTY Right 04/16/2013   Procedure: RIGHT TOTAL KNEE ARTHROPLASTY;  Surgeon: Shelda Pal, MD;  Location: [facility]  ORS;  Service: Orthopedics;  Laterality: Right;   TOTAL KNEE ARTHROPLASTY Left 09/15/2014   Procedure: LEFT TOTAL KNEE ARTHROPLASTY;  Surgeon: Shelda Pal, MD;  Location: [facility] ORS;  Service: Orthopedics;  Laterality: Left;   TUBAL LIGATION  1979   UPPER GI ENDOSCOPY     Patient Active Problem List   Diagnosis Date Noted   Cerebral vascular disease 10/10/2022   Gait abnormality 07/11/2022   Pressure injury of skin 06/05/2022   Rib fracture 06/04/2022   Stage 3a chronic kidney disease (CKD) (HCC) 06/04/2022   Skin ulcer with fat layer exposed (HCC) 03/16/2022   Vitamin B 12 deficiency 01/27/2022   Malaise 01/27/2022   Fever and chills 01/27/2022   Wheezing 01/27/2022   Vitamin D deficiency  01/27/2022   Hallucinations 11/24/2021   Abdominal pain 11/12/2021   Falls frequently 03/24/2021   Difficulty walking 04/04/2020   Hypophonia 01/15/2020   Numbness 12/28/2019   Left arm weakness 12/09/2019   Tremor 08/26/2019   Parkinsonism (HCC) 07/29/2019   DDD (degenerative disc disease), lumbar 10/19/2018   Preventative health care 10/19/2018   Prediabetes 10/19/2018   (HFpEF) heart failure with preserved ejection fraction (HCC) 12/12/2017   Pericardial effusion with cardiac tamponade 11/13/2017   Paroxysmal atrial fibrillation (HCC) 11/05/2017   Acute cough 05/30/2017   Anxiety 03/21/2016   Insomnia 07/17/2015   MI (mitral incompetence) 12/10/2014   Major depressive disorder 12/09/2014   OSA (obstructive sleep apnea) 12/09/2014   GERD (gastroesophageal reflux disease) 12/09/2014   Chronic diastolic CHF (congestive heart failure) (HCC)    HLD (hyperlipidemia)    Osteoarthritis of both knees    S/P left TKA 08/19/2014   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 (BMI) 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 (HCC) 04/17/2013   History of ductal carcinoma in situ (DCIS) of breast 11/05/2012   Essential hypertension, benign 11/05/2012    ONSET DATE: 02/14/2023 referral  REFERRING DIAG: Z86.73 (ICD-10-CM) - History of CVA (cerebrovascular accident) R53.81 (ICD-10-CM) - Physical deconditioning  THERAPY DIAG:  Other abnormalities of gait and mobility  Abnormal posture  Muscle weakness (generalized)  Rationale for Evaluation and Treatment: Rehabilitation  SUBJECTIVE:                                                                                                                                                                                             SUBJECTIVE STATEMENT: Patient reports doing well. Does still have hematoma to L lateral distal LE, per patient- on abx for cellulitis with this. Reports improving strength in R LE. Denies falls.    Pt accompanied by: self    PERTINENT HISTORY:  PD, CVA, Depression, arthritis, GERD, CA, CHF, HLD, HTN  PAIN:  Are you having pain? No and Yes: NPRS scale: 1/10 Pain location: Neck  Pain description: Achy, spasm   PRECAUTIONS: Fall and Other: R hemi  PATIENT GOALS: "I want to walk again"   OBJECTIVE:   DIAGNOSTIC FINDINGS: 01/13/23 MRI Brain IMPRESSION: 1. Suspected 5 mm early subacute infarct in the right precentral gyrus. 2. Severe chronic small vessel ischemic disease. New small chronic cortical infarcts in the left frontal and parietal lobes. 3. Progressive sinusitis.   TODAY'S TREATMENT:       Ther Act  Pt performed sit to stand from [facility] to RW w/min-mod Ax2 this date due to heavy R truncal lean and retropulsion. Pt unable to shift weight anteriorly to facilitate stand, requiring mod A to scoot hips forward and min cues for proper hand placement. Once standing, used mirror to provide visual biofeedback on body position to facilitate midline orientation. Pt able to facilitate weight shift to L side w/use of mirror on second rep, but was too fatigued on final rep. Pt able to take 2 steps w/RW w/min-mod A for lateral weight shifting but began leaning heavy to R side and could not maintain knee extension bilaterally. Pt reports she is tired today and has not been eating well. Close WC follow throughout.  Max tactile cues applied to pt's L shoulder to facilitate lateral weight shift and functional core strength, x2 minutes. Pt able to shift weight to L side but unable to maintain due to functional weakness.  SciFit level 1 for 8 minutes using BUE/BLEs for neural priming for reciprocal movement, dynamic cardiovascular conditioning and increased amplitude of stepping. Pt required min A to place feet into/out of pedals and min A throughout to promote increased ROM.   PATIENT EDUCATION: Education details: continue HEP, next appointment date and time Person educated: Patient Education method: Explanation and  Handouts Education comprehension: verbalized understanding and needs further education  HOME EXERCISE PROGRAM: Access Code: [email or web address] URL: [email or web address] Date: 04/26/2023 Prepared by: Merry Lofty  Exercises - Supine Gluteal Sets  - 1 x daily - 7 x weekly - 3 sets - 10 reps - Supine Hip Abduction  - 1 x daily - 7 x weekly - 3 sets - 10 reps - Supine Heel Slide  - 1 x daily - 7 x weekly - 3 sets - 10 reps - Supine Ankle Pumps  - 1 x daily - 7 x weekly - 3 sets - 10 reps - Seated Scapular Retraction  - 1 x daily - 7 x weekly - 3 sets - 10 reps - Supine Hamstring Stretch with Caregiver  - 1 x daily - 7 x weekly - 3 sets - 30s hold - Seated Hamstring Stretch with Chair  - 1 x daily - 7 x weekly - 3 sets - 30s hold  GOALS: Goals reviewed with patient? Yes  SHORT TERM GOALS: Target date: 05/05/23  Pt will be independent with initial HEP for improved functional strength Baseline: to be provided Goal status: MET  2.  Patient will complete sit <> stand with LRAD and MaxA x1  Baseline: Total A, max A to total A in // bars Goal status: IN PROGRESS  3.  Patient will initiate gait training Baseline: non-ambulatory; initiated 9/18  Goal status: MET  4.  Patient will maintain static sitting balance at EOB for at least 30s with no more than ModA Baseline: to be assessed; CGA-min A (9/13) Goal status: MET  LONG TERM GOALS: Target date: 06/16/23  Pt will be independent with final HEP for improved functional strength Baseline: to be provided Goal status: INITIAL  Patient will complete sit <> stand with LRAD and Mod x1  Baseline: Total A Goal status: INITIAL  Patient will ambulate at least 64ft in // bars with no more than ModA + wc follow as necessary Baseline: MaxA x57ft + wc follow Goal status: REVISED   Patient will maintain static sitting balance at EOB for at least 60s with no more than MinA Baseline: to be assessed; CGA-min A (9/13) Goal status:  MET  ASSESSMENT:  CLINICAL IMPRESSION: Emphasis of skilled PT session on transfers, gait training and reciprocal coordination. Pt limited by fatigue this date and reported she does not feel as well as she did last session. Pt w/heavy R truncal lean throughout session that increased w/fatigue. Pt responds well to use of visual and tactile cues but is unable to sustain midline positioning this date. Continue POC.    OBJECTIVE IMPAIRMENTS: cardiopulmonary status limiting activity, decreased activity tolerance, decreased balance, decreased coordination, decreased endurance, decreased knowledge of condition, decreased knowledge of use of DME, decreased mobility, difficulty walking, decreased ROM, decreased strength, impaired perceived functional ability, impaired sensation, impaired tone, improper body mechanics, and postural dysfunction.   ACTIVITY LIMITATIONS: carrying, lifting, bending, sitting, standing, squatting, stairs, transfers, bed mobility, continence, bathing, toileting, dressing, self feeding, reach over head, hygiene/grooming, and locomotion level  PARTICIPATION LIMITATIONS: meal prep, cleaning, laundry, medication management, interpersonal relationship, shopping, and community activity  PERSONAL FACTORS: Age, Past/current experiences, Time since onset of injury/illness/exacerbation, Transportation, and 3+ comorbidities: see above  are also affecting patient's functional outcome.   REHAB POTENTIAL: Fair time since onset  CLINICAL DECISION MAKING: Evolving/moderate complexity  EVALUATION COMPLEXITY: Moderate  PLAN:  PT FREQUENCY: 2x/week  PT DURATION: 8 weeks  PLANNED INTERVENTIONS: Therapeutic exercises, Therapeutic activity, Neuromuscular re-education, Balance training, Gait training, Patient/Family education, Self Care, Joint mobilization, Stair training, Vestibular training, Visual/preceptual remediation/compensation, Orthotic/Fit training, DME instructions, Aquatic Therapy,  Dry Needling, Manual therapy, and Re-evaluation  PLAN FOR NEXT SESSION: 10th visit progress note.sitting balance, STS, transfers, gait in // bars vs RW. Try priming anterior weight shift/ reciprocal coordination prior to gait   Jill Alexanders Addalyn Speedy, PT, DPT [facility] [address] [place], [place]  [address] Phone:  [phone] Fax:  [phone] 05/31/2023, 2:08 PM

## 2023-06-02 ENCOUNTER — Ambulatory Visit: Payer: Medicare (Managed Care) | Admitting: Physical Therapy

## 2023-06-02 DIAGNOSIS — M6281 Muscle weakness (generalized): Secondary | ICD-10-CM

## 2023-06-02 DIAGNOSIS — R293 Abnormal posture: Secondary | ICD-10-CM

## 2023-06-02 DIAGNOSIS — R2689 Other abnormalities of gait and mobility: Secondary | ICD-10-CM

## 2023-06-02 NOTE — Therapy (Signed)
OUTPATIENT PHYSICAL THERAPY NEURO TREATMENT- ARRIVED NO CHARGE   Patient Name: Selena Bush MRN: [id number] DOB:12-15-1945, 77 y.o., female Today's Date: 06/02/2023   PCP: Marny Lowenstein, MD REFERRING PROVIDER: Gillian Shields, NP    END OF SESSION:  PT End of Session - 06/02/23 1328     Visit Number 9   Arrived no charge   Number of Visits 17    Date for PT Re-Evaluation 06/16/23    Authorization Type PACE    PT Start Time 1319    PT Stop Time 1329   Arrived no charge   PT Time Calculation (min) 10 min    Activity Tolerance Other (comment)   Pt has not had BM in >1 week, significant abdominal discomfort and urge to use restroom during session   Behavior During Therapy [facility] for tasks assessed/performed                 Past Medical History:  Diagnosis Date   Anxiety    Atrial fibrillation (HCC)    Cat bite of right hand 03/25/2019   Chronic diastolic CHF (congestive heart failure) (HCC)    a. echo 09/2014: EF 60-65%, diastolic dysfunction, mild LVH, nl RV size & systolic function, mildly dilated LA (4.3 cm), mild MR/TR, mildly elevated PASP 36.7 mm Hg   DDD (degenerative disc disease), cervical    DDD (degenerative disc disease), lumbar    Depression    Diffuse cystic mastopathy 2014   Dyspnea    Dysrhythmia    GERD (gastroesophageal reflux disease)    Gout    Headache    rare   HLD (hyperlipidemia)    a. statin intolerant 2/2 myalgias   Hx of dysplastic nevus 12/25/2018   L medial ankle   Hypercholesterolemia    Hypertension    Malignant neoplasm of upper-outer quadrant of female breast (HCC) 10/2012   Papillary DCIS, sentinel node negative. DR/PR positive. PARTIAL RIGHT MASTECTOMY FOR BREAST CANCER--HAD RADIATION - NO CHEMO --DR. CRYSTAL [place] ONCOLOGIST   Obesity    OSA on CPAP    Osteoarthritis of both knees    a. s/p right TKA 04/2013 & left TKA 09/2014   Otitis externa    Parkinson disease [facility])    Personal history of radiation therapy 2015    RIGHT breast-mammosite per pt   Pneumonia of both lungs due to infectious organism 01/08/2021   Sleep difficulties    LUNESTA HAS HELPED   Vaginal cyst    Past Surgical History:  Procedure Laterality Date   ABDOMINAL HYSTERECTOMY  1992   DUB; fibroids; endometriosis.  One remaining ovary.     BREAST LUMPECTOMY Right 2015   Papillary DCIS, sentinel node negative. DR/PR positive. PARTIAL RIGHT MASTECTOMY FOR BREAST CANCER--HAD RADIATION - NO CHEMO --DR. CRYSTAL [place] ONCOLOGIST   BREAST SURGERY Right 10/2012   Wide excision,APB RT 10 mm papillary DCIS, ER/PR positive. Sentinel node negative. Partial breast radiation.   CATARACT EXTRACTION, BILATERAL  02/13/2016   [facility]avis.   CHOLECYSTECTOMY  1994   COLONOSCOPY  2015   1 benign polyp-every 5 years/ Dr Bluford Kaufmann   COLONOSCOPY WITH PROPOFOL N/A 02/10/2021   Procedure: COLONOSCOPY WITH PROPOFOL;  Surgeon: Earline Mayotte, MD;  Location: [facility] ENDOSCOPY;  Service: Endoscopy;  Laterality: N/A;   ERCP  1995   JOINT REPLACEMENT Right 04/2013   knee   PERICARDIOCENTESIS N/A 11/13/2017   Procedure: PERICARDIOCENTESIS;  Surgeon: Yvonne Kendall, MD;  Location: [facility] INVASIVE CV LAB;  Service: Cardiovascular;  Laterality: N/A;  TOTAL KNEE ARTHROPLASTY Right 04/16/2013   Procedure: RIGHT TOTAL KNEE ARTHROPLASTY;  Surgeon: Shelda Pal, MD;  Location: [facility] ORS;  Service: Orthopedics;  Laterality: Right;   TOTAL KNEE ARTHROPLASTY Left 09/15/2014   Procedure: LEFT TOTAL KNEE ARTHROPLASTY;  Surgeon: Shelda Pal, MD;  Location: [facility] ORS;  Service: Orthopedics;  Laterality: Left;   TUBAL LIGATION  1979   UPPER GI ENDOSCOPY     Patient Active Problem List   Diagnosis Date Noted   Cerebral vascular disease 10/10/2022   Gait abnormality 07/11/2022   Pressure injury of skin 06/05/2022   Rib fracture 06/04/2022   Stage 3a chronic kidney disease (CKD) (HCC) 06/04/2022   Skin ulcer with fat layer exposed (HCC) 03/16/2022   Vitamin B 12 deficiency  01/27/2022   Malaise 01/27/2022   Fever and chills 01/27/2022   Wheezing 01/27/2022   Vitamin D deficiency 01/27/2022   Hallucinations 11/24/2021   Abdominal pain 11/12/2021   Falls frequently 03/24/2021   Difficulty walking 04/04/2020   Hypophonia 01/15/2020   Numbness 12/28/2019   Left arm weakness 12/09/2019   Tremor 08/26/2019   Parkinsonism (HCC) 07/29/2019   DDD (degenerative disc disease), lumbar 10/19/2018   Preventative health care 10/19/2018   Prediabetes 10/19/2018   (HFpEF) heart failure with preserved ejection fraction (HCC) 12/12/2017   Pericardial effusion with cardiac tamponade 11/13/2017   Paroxysmal atrial fibrillation (HCC) 11/05/2017   Acute cough 05/30/2017   Anxiety 03/21/2016   Insomnia 07/17/2015   MI (mitral incompetence) 12/10/2014   Major depressive disorder 12/09/2014   OSA (obstructive sleep apnea) 12/09/2014   GERD (gastroesophageal reflux disease) 12/09/2014   Chronic diastolic CHF (congestive heart failure) (HCC)    HLD (hyperlipidemia)    Osteoarthritis of both knees    S/P left TKA 08/19/2014   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 (BMI) 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 (HCC) 04/17/2013   History of ductal carcinoma in situ (DCIS) of breast 11/05/2012   Essential hypertension, benign 11/05/2012    ONSET DATE: 02/14/2023 referral  REFERRING DIAG: Z86.73 (ICD-10-CM) - History of CVA (cerebrovascular accident) R53.81 (ICD-10-CM) - Physical deconditioning  THERAPY DIAG:  Abnormal posture  Muscle weakness (generalized)  Other abnormalities of gait and mobility  Rationale for Evaluation and Treatment: Rehabilitation  SUBJECTIVE:                                                                                                                                                                                             SUBJECTIVE STATEMENT: Patient reports not doing well. Has not had a BM for > 1 week and is very uncomfortable. Feels a  lot  of pressure in her abdomen and feels like she could go now but did not want to miss PT. Denies pain.    Pt accompanied by: self   PERTINENT HISTORY: PD, CVA, Depression, arthritis, GERD, CA, CHF, HLD, HTN  PAIN:  Are you having pain? No  PRECAUTIONS: Fall and Other: R hemi  PATIENT GOALS: "I want to walk again"   OBJECTIVE:   DIAGNOSTIC FINDINGS: 01/13/23 MRI Brain IMPRESSION: 1. Suspected 5 mm early subacute infarct in the right precentral gyrus. 2. Severe chronic small vessel ischemic disease. New small chronic cortical infarcts in the left frontal and parietal lobes. 3. Progressive sinusitis.   TODAY'S TREATMENT:       Arrived no charge  Rescheduled pt's appointment at end of POC and encouraged pt to speak to [facility] nurse and request laxative. Pt verbalized understanding. Provided pt w/updated calendar.    PATIENT EDUCATION: Education details: continue HEP, next appointment date and time Person educated: Patient Education method: Explanation and Handouts Education comprehension: verbalized understanding and needs further education  HOME EXERCISE PROGRAM: Access Code: [email or web address] URL: [email or web address] Date: 04/26/2023 Prepared by: Merry Lofty  Exercises - Supine Gluteal Sets  - 1 x daily - 7 x weekly - 3 sets - 10 reps - Supine Hip Abduction  - 1 x daily - 7 x weekly - 3 sets - 10 reps - Supine Heel Slide  - 1 x daily - 7 x weekly - 3 sets - 10 reps - Supine Ankle Pumps  - 1 x daily - 7 x weekly - 3 sets - 10 reps - Seated Scapular Retraction  - 1 x daily - 7 x weekly - 3 sets - 10 reps - Supine Hamstring Stretch with Caregiver  - 1 x daily - 7 x weekly - 3 sets - 30s hold - Seated Hamstring Stretch with Chair  - 1 x daily - 7 x weekly - 3 sets - 30s hold  GOALS: Goals reviewed with patient? Yes  SHORT TERM GOALS: Target date: 05/05/23  Pt will be independent with initial HEP for improved functional strength Baseline: to be provided Goal  status: MET  2.  Patient will complete sit <> stand with LRAD and MaxA x1  Baseline: Total A, max A to total A in // bars Goal status: IN PROGRESS  3.  Patient will initiate gait training Baseline: non-ambulatory; initiated 9/18  Goal status: MET  4.  Patient will maintain static sitting balance at EOB for at least 30s with no more than ModA Baseline: to be assessed; CGA-min A (9/13) Goal status: MET   LONG TERM GOALS: Target date: 06/16/23  Pt will be independent with final HEP for improved functional strength Baseline: to be provided Goal status: INITIAL  Patient will complete sit <> stand with LRAD and Mod x1  Baseline: Total A Goal status: INITIAL  Patient will ambulate at least 108ft in // bars with no more than ModA + wc follow as necessary Baseline: MaxA x29ft + wc follow Goal status: REVISED   Patient will maintain static sitting balance at EOB for at least 60s with no more than MinA Baseline: to be assessed; CGA-min A (9/13) Goal status: MET  ASSESSMENT:  CLINICAL IMPRESSION: Arrived no charge   OBJECTIVE IMPAIRMENTS: cardiopulmonary status limiting activity, decreased activity tolerance, decreased balance, decreased coordination, decreased endurance, decreased knowledge of condition, decreased knowledge of use of DME, decreased mobility, difficulty walking, decreased ROM, decreased strength, impaired perceived functional ability, impaired sensation, impaired tone, improper  body mechanics, and postural dysfunction.   ACTIVITY LIMITATIONS: carrying, lifting, bending, sitting, standing, squatting, stairs, transfers, bed mobility, continence, bathing, toileting, dressing, self feeding, reach over head, hygiene/grooming, and locomotion level  PARTICIPATION LIMITATIONS: meal prep, cleaning, laundry, medication management, interpersonal relationship, shopping, and community activity  PERSONAL FACTORS: Age, Past/current experiences, Time since onset of  injury/illness/exacerbation, Transportation, and 3+ comorbidities: see above  are also affecting patient's functional outcome.   REHAB POTENTIAL: Fair time since onset  CLINICAL DECISION MAKING: Evolving/moderate complexity  EVALUATION COMPLEXITY: Moderate  PLAN:  PT FREQUENCY: 2x/week  PT DURATION: 8 weeks  PLANNED INTERVENTIONS: Therapeutic exercises, Therapeutic activity, Neuromuscular re-education, Balance training, Gait training, Patient/Family education, Self Care, Joint mobilization, Stair training, Vestibular training, Visual/preceptual remediation/compensation, Orthotic/Fit training, DME instructions, Aquatic Therapy, Dry Needling, Manual therapy, and Re-evaluation  PLAN FOR NEXT SESSION: 10th visit progress note.sitting balance, STS, transfers, gait in // bars vs RW. Try priming anterior weight shift/ reciprocal coordination prior to gait   Jill Alexanders Tajana Crotteau, PT, DPT [facility] [address] [place], [place]  [address] Phone:  [phone] Fax:  [phone] 06/02/2023, 1:30 PM

## 2023-06-06 ENCOUNTER — Ambulatory Visit: Payer: Medicare (Managed Care) | Admitting: Neurology

## 2023-06-07 ENCOUNTER — Ambulatory Visit: Payer: Medicare (Managed Care) | Admitting: Occupational Therapy

## 2023-06-07 ENCOUNTER — Ambulatory Visit: Payer: Medicare (Managed Care)

## 2023-06-07 DIAGNOSIS — R278 Other lack of coordination: Secondary | ICD-10-CM

## 2023-06-07 DIAGNOSIS — M6281 Muscle weakness (generalized): Secondary | ICD-10-CM

## 2023-06-07 DIAGNOSIS — R471 Dysarthria and anarthria: Secondary | ICD-10-CM

## 2023-06-07 DIAGNOSIS — R293 Abnormal posture: Secondary | ICD-10-CM

## 2023-06-07 DIAGNOSIS — R262 Difficulty in walking, not elsewhere classified: Secondary | ICD-10-CM

## 2023-06-07 DIAGNOSIS — R2689 Other abnormalities of gait and mobility: Secondary | ICD-10-CM

## 2023-06-07 DIAGNOSIS — R1312 Dysphagia, oropharyngeal phase: Secondary | ICD-10-CM

## 2023-06-07 NOTE — Therapy (Unsigned)
OUTPATIENT OCCUPATIONAL THERAPY NEURO TREATMENT  Patient Name: Selena Bush MRN: [id number] DOB:06/07/1946, 77 y.o., female Today's Date: 06/07/2023  PCP: Jethro Bastos, MD  REFERRING PROVIDER: Alver Sorrow, NP  END OF SESSION:  OT End of Session - 06/07/23 1225     Visit Number 4    Number of Visits 17    Date for OT Re-Evaluation 06/28/23    Authorization Type [employer]    OT Start Time 1230    OT Stop Time 1315    OT Time Calculation (min) 45 min    Equipment Utilized During Treatment Personal WC    Activity Tolerance Patient tolerated treatment well    Behavior During Therapy [facility] for tasks assessed/performed             Past Medical History:  Diagnosis Date   Anxiety    Atrial fibrillation (HCC)    Cat bite of right hand 03/25/2019   Chronic diastolic CHF (congestive heart failure) (HCC)    a. echo 09/2014: EF 60-65%, diastolic dysfunction, mild LVH, nl RV size & systolic function, mildly dilated LA (4.3 cm), mild MR/TR, mildly elevated PASP 36.7 mm Hg   DDD (degenerative disc disease), cervical    DDD (degenerative disc disease), lumbar    Depression    Diffuse cystic mastopathy 2014   Dyspnea    Dysrhythmia    GERD (gastroesophageal reflux disease)    Gout    Headache    rare   HLD (hyperlipidemia)    a. statin intolerant 2/2 myalgias   Hx of dysplastic nevus 12/25/2018   L medial ankle   Hypercholesterolemia    Hypertension    Malignant neoplasm of upper-outer quadrant of female breast (HCC) 10/2012   Papillary DCIS, sentinel node negative. DR/PR positive. PARTIAL RIGHT MASTECTOMY FOR BREAST CANCER--HAD RADIATION - NO CHEMO --DR. CRYSTAL [place] ONCOLOGIST   Obesity    OSA on CPAP    Osteoarthritis of both knees    a. s/p right TKA 04/2013 & left TKA 09/2014   Otitis externa    Parkinson disease [facility])    Personal history of radiation therapy 2015   RIGHT breast-mammosite per pt   Pneumonia of both lungs due to infectious organism  01/08/2021   Sleep difficulties    LUNESTA HAS HELPED   Vaginal cyst    Past Surgical History:  Procedure Laterality Date   ABDOMINAL HYSTERECTOMY  1992   DUB; fibroids; endometriosis.  One remaining ovary.     BREAST LUMPECTOMY Right 2015   Papillary DCIS, sentinel node negative. DR/PR positive. PARTIAL RIGHT MASTECTOMY FOR BREAST CANCER--HAD RADIATION - NO CHEMO --DR. CRYSTAL  ONCOLOGIST   BREAST SURGERY Right 10/2012   Wide excision,APB RT 10 mm papillary DCIS, ER/PR positive. Sentinel node negative. Partial breast radiation.   CATARACT EXTRACTION, BILATERAL  02/13/2016   [facility].   CHOLECYSTECTOMY  1994   COLONOSCOPY  2015   1 benign polyp-every 5 years/ Dr Bluford Kaufmann   COLONOSCOPY WITH PROPOFOL N/A 02/10/2021   Procedure: COLONOSCOPY WITH PROPOFOL;  Surgeon: Earline Mayotte, MD;  Location[facility] ENDOSCOPY;  Service: Endoscopy;  Laterality: N/A;   ERCP  1995   JOINT REPLACEMENT Right 04/2013   knee   PERICARDIOCENTESIS N/A 11/13/2017   Procedure: PERICARDIOCENTESIS;  Surgeon: Yvonne Kendall, MD;  Location: [facility] INVASIVE CV LAB;  Service: Cardiovascular;  Laterality: N/A;   TOTAL KNEE ARTHROPLASTY Right 04/16/2013   Procedure: RIGHT TOTAL KNEE ARTHROPLASTY;  Surgeon: Shelda Pal, MD;  Location: [facility]  ORS;  Service: Orthopedics;  Laterality: Right;   TOTAL KNEE ARTHROPLASTY Left 09/15/2014   Procedure: LEFT TOTAL KNEE ARTHROPLASTY;  Surgeon: Shelda Pal, MD;  Location: [facility] ORS;  Service: Orthopedics;  Laterality: Left;   TUBAL LIGATION  1979   UPPER GI ENDOSCOPY     Patient Active Problem List   Diagnosis Date Noted   Cerebral vascular disease 10/10/2022   Gait abnormality 07/11/2022   Pressure injury of skin 06/05/2022   Rib fracture 06/04/2022   Stage 3a chronic kidney disease (CKD) (HCC) 06/04/2022   Skin ulcer with fat layer exposed (HCC) 03/16/2022   Vitamin B 12 deficiency 01/27/2022   Malaise 01/27/2022   Fever and chills 01/27/2022   Wheezing 01/27/2022    Vitamin D deficiency 01/27/2022   Hallucinations 11/24/2021   Abdominal pain 11/12/2021   Falls frequently 03/24/2021   Difficulty walking 04/04/2020   Hypophonia 01/15/2020   Numbness 12/28/2019   Left arm weakness 12/09/2019   Tremor 08/26/2019   Parkinsonism (HCC) 07/29/2019   DDD (degenerative disc disease), lumbar 10/19/2018   Preventative health care 10/19/2018   Prediabetes 10/19/2018   (HFpEF) heart failure with preserved ejection fraction (HCC) 12/12/2017   Pericardial effusion with cardiac tamponade 11/13/2017   Paroxysmal atrial fibrillation (HCC) 11/05/2017   Acute cough 05/30/2017   Anxiety 03/21/2016   Insomnia 07/17/2015   MI (mitral incompetence) 12/10/2014   Major depressive disorder 12/09/2014   OSA (obstructive sleep apnea) 12/09/2014   GERD (gastroesophageal reflux disease) 12/09/2014   Chronic diastolic CHF (congestive heart failure) (HCC)    HLD (hyperlipidemia)    Osteoarthritis of both knees    S/P left TKA 08/19/2014   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 (BMI) 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 (HCC) 04/17/2013   History of ductal carcinoma in situ (DCIS) of breast 11/05/2012   Essential hypertension, benign 11/05/2012   ONSET DATE: 04/06/2023 (date of referral)  REFERRING DIAG: Z86.73 (ICD-10-CM) - History of CVA  THERAPY DIAG:  Other lack of coordination  Muscle weakness (generalized)  Abnormal posture  Rationale for Evaluation and Treatment: Rehabilitation  SUBJECTIVE:   SUBJECTIVE STATEMENT: Pt arrived early today and was alone in the lobby upon OT arrival.  Pt asked for a cup of water upon arrival.   Pt accompanied by: self  PERTINENT HISTORY: PD, CVA, Depression, arthritis, GERD, CA, CHF, HLD, HTN   PRECAUTIONS: Fall  WEIGHT BEARING RESTRICTIONS: No  PAIN:  Are you having pain? No  FALLS: Has patient fallen in last 6 months? Yes. Number of falls fell from her stroke  LIVING ENVIRONMENT: Lives with:  caregiver Lives in:  House/apartment Stairs:  ramped entrance Has following equipment at home: Single point cane, Walker - 2 wheeled, Environmental consultant - 4 wheeled, Wheelchair (manual), shower chair, bed side commode, Ramped entry, and [employer] lift   PLOF: Needs assistance with ADLs, Needs assistance with homemaking, Needs assistance with gait, and Needs assistance with transfers  PATIENT GOALS: improve use of BUEs  OBJECTIVE: (from evaluation unless otherwise noted)  05/17/2023: Multiple skin tears to RUE; 2 bandaged -1 proximal forearm and 1 on dorsal R hand; mid forearm tear in outline of rosary bracelet; dried blood on rolled up sleeve Cyanotic skin of L hand, which is cool to the touch, no improvement to color with gentle massage or positioning; pt has active movement; denies pain Feet sliding off foot plates of wheelchair, repositioned flat on floor with indention on top of foot from where tongue of shoe rested against dorsiflexed  foot; pt does not have socks donned; signs of skin break down on R foot though blanches L lower leg has hematoma laterally with erythema surrounding approximately 10-12" in length extending from just below knee to top of shoe and approximately 4-5" wide  HAND DOMINANCE: Right  ADLs: Overall ADLs: dependent  IADLs: Overall IADLs: dependent  MOBILITY STATUS:  wheelchair bound  POSTURE COMMENTS:  rounded shoulders, forward head, increased thoracic kyphosis, right pelvic obliquity, flexed trunk , and weight shift right   ACTIVITY TOLERANCE: Activity tolerance: fair  FUNCTIONAL OUTCOME MEASURES: PSFS: 0   Total score = sum of the activity scores/number of activities Minimum detectable change (90%CI) for average score = 2 points Minimum detectable change (90%CI) for single activity score = 3 points   UPPER EXTREMITY ROM:     AROM Right (eval) Left (eval)  Shoulder flexion 80 45  Shoulder abduction 45 45  Elbow flexion WNL WFL  Elbow extension -40 -30  Wrist flexion 60 62   Wrist extension 30 38  Wrist pronation WNL WNL  Wrist supination To neutral 10  Digit Composite Flexion Lacks 2 cm 2nd digit Lacks 1 cm 2nd digit  Digit Composite Extension WFL WNL  Digit Opposition WFL WFL  (Blank rows = not tested)  UPPER EXTREMITY MMT:     MMT Right (eval) Left (eval)  Shoulder flexion BFL BFL  Shoulder abduction BFL BFL  Elbow flexion [facility] WFL  Elbow extension WFL WFL  (Blank rows = not tested) Lacks endurance with testing.   HAND FUNCTION: Grip strength: Right: 21.8 lbs; Left: 25.5 lbs  COORDINATION: Box and Blocks:  Right 3blocks, Left 5blocks  SENSATION: WFL  EDEMA: none reported or observed  MUSCLE TONE: RUE: Moderate and Rigidity and LUE: Moderate and Rigidity  COGNITION: Overall cognitive status: Within functional limits for tasks assessed  VISION: To be assessed during future visit  PERCEPTION: Not tested  PRAXIS: Not tested  OBSERVATIONS: Pt requires assistance to propel wheelchair. Poor positioning within wheelchair pushing BUE into shoulder protraction, elbow flexion, shoulder adduction, and wrist flexion. Unable to look pt directly in the eyes given kyphotic and forward head posturing. Appears well kept. Glasses donned. Pt able to hold small sandwich in her R hand upon leaving clinic.   TODAY'S TREATMENT:                                                                                                                               Wheelchair management: OT assisted with improved positioning in WC with adjustment to lateral supports - to decrease leaning onto WC backrest canes by turing the wedge around to cover the upright posts and hips adjusted slightly to center her better in WC.    Self Care: Pt engaged in hand to mouth motions with cup to drink water throughout session.  OT found plastic mug with handle and slightly weighted base for stability.  Pt able to pull straw out of paper lining  after it was initially ripped with extra  time and slight readjustment of hands.     Pt continued trials of hand to mouth motions with elbow resting on padded surface for increased comfort and need further practice to get hand consistently to her mouth with utensil or cup correctly oriented.   She was engaged in block activity to simulate self feeding ie) picking up and placing small blocks into a container held up near her chin.  She was able to perform this task with increased ease with L UE motion for flexion in more timely and smooth manner than R UE. Also engaged in table top block activity to small box with fairly good success x10 blocks with each hand.  Therapeutic Exercises Introduced table top UE ROM activities for shoulder and elbow ROM with combination of the below exercises into circular motions on table top in both directions along with push/pull motions for elbow flexion/extension. - Seated Shoulder Flexion Towel Slide at Table Top   - Seated Shoulder Scaption Slide at Table Top with Forearm in Neutral   - Seated Bilateral Shoulder Flexion Towel Slide at Table Top   Pt able to demonstrate good reaching with activity from WC.  PATIENT EDUCATION: Education details: Table top UE ROM Person educated: Patient Education method: Explanation, Demonstration, Tactile cues, Verbal cues, and Handouts Education comprehension: verbalized understanding, returned demonstration, tactile cues required, and needs further education  HOME EXERCISE PROGRAM: 06/07/23 - Table top UE ROM Access Code: [email or web address]   GOALS:  SHORT TERM GOALS: Target date: 05/31/2023    Patient will demonstrate independence with initial BUE ROM HEP. Baseline: not yet initiated Goal status: IN PROGRESS  2.  Pt will be able to place at least 8 blocks using right hand with completion of Box and Blocks test.  Baseline: Right 3 blocks Goal status: IN Progress  3.  Pt will be able to place at least 10 blocks using left hand with completion of Box and Blocks  test. Baseline: Left 5 blocks Goal status: IN Progress  4.  Pt will independently recall at least 3 positioning and pressure relief strategies as needed to prevent pressure injuries and contractures.  Baseline:  Goal status: IN Progress  5.  Pt will demonstrate full composite flexion bilaterally as needed to pick up and hold ADL items such as hair brush, tooth brush, eating utensils, etc.  Baseline:  Right  Left Lacks 2 cm 2nd digit Lacks 1 cm 2nd digit  Goal status: IN Progress  LONG TERM GOALS: Target date: 06/28/2023    Patient will demonstrate updated RUE and LUE HEP with 25% verbal cues or less for proper execution. Baseline: not yet initiated Goal status: IN Progress  2.  Pt will verbalize understanding of ways to prevent future PD related complications. Baseline: not yet initiated Goal status: INITIAL  3.  Pt will be able to place at least 12 blocks using right hand with completion of Box and Blocks test.  Baseline: Right 3 blocks Goal status: IN Progress  4.  Pt will be able to place at least 14 blocks using left hand with completion of Box and Blocks test. Baseline: Left 5 blocks Goal status: IN Progress  5.  Patient will report at least two-point increase in average PSFS score or at least three-point increase in a single activity score indicating functionally significant improvement given minimum detectable change.  Baseline: 0 total score (See above for individual activity scores)  Baseline:  Goal status: INITIAL  ASSESSMENT:  CLINICAL IMPRESSION: Patient is a 77 y.o. female who was seen today for occupational therapy treatment for positioning needs and UE functional use. Patient benefits from adjustments for WC positioning for increased comfort and alignment.  OT used slide board for [facility] tray to provided UE support to make it eaiser to bring hands towards face today.  Pt will benefit from continued skilled OT services in the outpatient setting to work on  impairments noted in functional UE use for ADLs and maximize use for self feeding or other functional UE tasks.  PERFORMANCE DEFICITS: in functional skills including ADLs, IADLs, coordination, tone, ROM, strength, Fine motor control, Gross motor control, mobility, body mechanics, decreased knowledge of use of DME, skin integrity, and UE functional use.   IMPAIRMENTS: are limiting patient from ADLs, IADLs, leisure, and social participation.   CO-MORBIDITIES: may have co-morbidities  that affects occupational performance. Patient will benefit from skilled OT to address above impairments and improve overall function.  REHAB POTENTIAL: Fair given chronicity of poor posturing   PLAN:  OT FREQUENCY: 2x/week  OT DURATION: 8 weeks  PLANNED INTERVENTIONS: self care/ADL training, therapeutic exercise, therapeutic activity, neuromuscular re-education, manual therapy, passive range of motion, functional mobility training, splinting, electrical stimulation, ultrasound, fluidotherapy, moist heat, patient/family education, visual/perceptual remediation/compensation, energy conservation, coping strategies training, DME and/or AE instructions, and Re-evaluation  RECOMMENDED OTHER SERVICES: none at this time. Pt is in process of obtaining w/c eval.   CONSULTED AND AGREED WITH PLAN OF CARE: Patient  PLAN FOR NEXT SESSION:  Review cup holding/drinking/self feeding; 3 positioning and pressure relief strategies Use cell phone  review/progress simple UE HEP ideas  From previous sessions - check re: does pt have NMES?  Victorino Sparrow, OT 06/07/2023, 5:25 PM

## 2023-06-07 NOTE — Patient Instructions (Signed)
Access Code: [email or web address] URL: [email or web address] Date: 06/07/2023 Prepared by: Amada Kingfisher  Exercises - Seated Shoulder Flexion Towel Slide at Table Top  - 2-3 x daily - 5-10 reps - Seated Shoulder Scaption Slide at Table Top with Forearm in Neutral  - 2-3 x daily - 5-10 reps - Seated Bilateral Shoulder Flexion Towel Slide at Table Top  - 2-3 x daily - 5-10 reps

## 2023-06-07 NOTE — Therapy (Signed)
OUTPATIENT SPEECH LANGUAGE PATHOLOGY [facility]   Patient Name: Selena Bush MRN: [id number] DOB:1945/11/23, 77 y.o., female Today's Date: 06/07/2023  PCP: Jethro Bastos, MD REFERRING PROVIDER: Alver Sorrow, NP  END OF SESSION:  End of Session - 06/07/23 1306     Visit Number 3    Number of Visits 25    Date for SLP Re-Evaluation 07/26/23    SLP Start Time 1405    SLP Stop Time  1445    SLP Time Calculation (min) 40 min    Activity Tolerance Patient tolerated treatment well              Past Medical History:  Diagnosis Date   Anxiety    Atrial fibrillation (HCC)    Cat bite of right hand 03/25/2019   Chronic diastolic CHF (congestive heart failure) (HCC)    a. echo 09/2014: EF 60-65%, diastolic dysfunction, mild LVH, nl RV size & systolic function, mildly dilated LA (4.3 cm), mild MR/TR, mildly elevated PASP 36.7 mm Hg   DDD (degenerative disc disease), cervical    DDD (degenerative disc disease), lumbar    Depression    Diffuse cystic mastopathy 2014   Dyspnea    Dysrhythmia    GERD (gastroesophageal reflux disease)    Gout    Headache    rare   HLD (hyperlipidemia)    a. statin intolerant 2/2 myalgias   Hx of dysplastic nevus 12/25/2018   L medial ankle   Hypercholesterolemia    Hypertension    Malignant neoplasm of upper-outer quadrant of female breast (HCC) 10/2012   Papillary DCIS, sentinel node negative. DR/PR positive. PARTIAL RIGHT MASTECTOMY FOR BREAST CANCER--HAD RADIATION - NO CHEMO --DR. CRYSTAL [place] ONCOLOGIST   Obesity    OSA on CPAP    Osteoarthritis of both knees    a. s/p right TKA 04/2013 & left TKA 09/2014   Otitis externa    Parkinson disease [facility])    Personal history of radiation therapy 2015   RIGHT breast-mammosite per pt   Pneumonia of both lungs due to infectious organism 01/08/2021   Sleep difficulties    LUNESTA HAS HELPED   Vaginal cyst    Past Surgical History:  Procedure Laterality Date    ABDOMINAL HYSTERECTOMY  1992   DUB; fibroids; endometriosis.  One remaining ovary.     BREAST LUMPECTOMY Right 2015   Papillary DCIS, sentinel node negative. DR/PR positive. PARTIAL RIGHT MASTECTOMY FOR BREAST CANCER--HAD RADIATION - NO CHEMO --DR. CRYSTAL [place] ONCOLOGIST   BREAST SURGERY Right 10/2012   Wide excision,APB RT 10 mm papillary DCIS, ER/PR positive. Sentinel node negative. Partial breast radiation.   CATARACT EXTRACTION, BILATERAL  02/13/2016   [facility].   CHOLECYSTECTOMY  1994   COLONOSCOPY  2015   1 benign polyp-every 5 years/ Dr Bluford Kaufmann   COLONOSCOPY WITH PROPOFOL N/A 02/10/2021   Procedure: COLONOSCOPY WITH PROPOFOL;  Surgeon: Earline Mayotte, MD;  Location: [facility] ENDOSCOPY;  Service: Endoscopy;  Laterality: N/A;   ERCP  1995   JOINT REPLACEMENT Right 04/2013   knee   PERICARDIOCENTESIS N/A 11/13/2017   Procedure: PERICARDIOCENTESIS;  Surgeon: Yvonne Kendall, MD;  Location: [facility] INVASIVE CV LAB;  Service: Cardiovascular;  Laterality: N/A;   TOTAL KNEE ARTHROPLASTY Right 04/16/2013   Procedure: RIGHT TOTAL KNEE ARTHROPLASTY;  Surgeon: Shelda Pal, MD;  Location: [facility] ORS;  Service: Orthopedics;  Laterality: Right;   TOTAL KNEE ARTHROPLASTY Left 09/15/2014   Procedure: LEFT TOTAL KNEE ARTHROPLASTY;  Surgeon: Madlyn Frankel  Charlann Boxer, MD;  Location: [facility] ORS;  Service: Orthopedics;  Laterality: Left;   TUBAL LIGATION  1979   UPPER GI ENDOSCOPY     Patient Active Problem List   Diagnosis Date Noted   Cerebral vascular disease 10/10/2022   Gait abnormality 07/11/2022   Pressure injury of skin 06/05/2022   Rib fracture 06/04/2022   Stage 3a chronic kidney disease (CKD) (HCC) 06/04/2022   Skin ulcer with fat layer exposed (HCC) 03/16/2022   Vitamin B 12 deficiency 01/27/2022   Malaise 01/27/2022   Fever and chills 01/27/2022   Wheezing 01/27/2022   Vitamin D deficiency 01/27/2022   Hallucinations 11/24/2021   Abdominal pain 11/12/2021   Falls frequently 03/24/2021   Difficulty  walking 04/04/2020   Hypophonia 01/15/2020   Numbness 12/28/2019   Left arm weakness 12/09/2019   Tremor 08/26/2019   Parkinsonism (HCC) 07/29/2019   DDD (degenerative disc disease), lumbar 10/19/2018   Preventative health care 10/19/2018   Prediabetes 10/19/2018   (HFpEF) heart failure with preserved ejection fraction (HCC) 12/12/2017   Pericardial effusion with cardiac tamponade 11/13/2017   Paroxysmal atrial fibrillation (HCC) 11/05/2017   Acute cough 05/30/2017   Anxiety 03/21/2016   Insomnia 07/17/2015   MI (mitral incompetence) 12/10/2014   Major depressive disorder 12/09/2014   OSA (obstructive sleep apnea) 12/09/2014   GERD (gastroesophageal reflux disease) 12/09/2014   Chronic diastolic CHF (congestive heart failure) (HCC)    HLD (hyperlipidemia)    Osteoarthritis of both knees    S/P left TKA 08/19/2014   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 Referral to Dr. Arbutus Leas after Right LE weakness, fall, CVA revealed weak voice. Apparently she was ambulatory prior to CVA vs fall Feb 2024. She reports vague h/o of ST, however she does not recall what was worked on. At this time, she is dependent feeder and endorses coughing/choking with meals "a few times a week" Per Dr. Arbutus Leas consult: Unfortunately, the patient fell in February, 2024 (pt states had her walker and just fell to the side,  she thinks) and declined significantly after that.  Patient states that she had her walker with her and fell to her side and had to lay on the side for over 30 minutes before EMS came.  Her right leg would not work per patient.  [facility] to [facility] that [facility] and her daughter states that they x-rayed the ankle because the right leg was not working. [facility] noted that patient could not walk, and previously had been able to and subsequently sent her to SNF [facility].  She was not evaluated at the hospital.  She had an appt 4 days later with Dr. Terrace Arabia.  Daughter states that they reviewed the MRI done prior to the fall and did a carotid u/s.  No imaging was ordered after the fall.   She could stand when she left SNF but she couldn't walk but she was able to raise the  R leg after she worked for a month and rehab.  However, she can no longer stand now that she is home and seems to have backtracked.     PAIN:  Are you having pain? No  FALLS: Has patient fallen in last 6 months?  Yes, See PT evaluation for details  LIVING ENVIRONMENT: Lives with:  Personal care attendant, [name] Lives in: House/apartment  PLOF:  Level of assistance: Needed assistance with ADLs, Needed assistance with IADLS Employment: Retired  PATIENT GOALS: "To get to speak again the way I used to"  OBJECTIVE:   TODAY'S TREATMENT:                                                                                                                                          06-07-23: Addressed dysarthria through Speak Out lesson 2. SLP led pt through exercises, provided consistent model prior to pt execution. Occasional mod A required to achieve target dB. Averaged 80 dB in loud "ah", 72 dB in glides, 78 dB in counting, 72 dB in reading, and 71 dB in cognitive tasks given cues. SLP ordered pt's Speak Out workbook. Session in part limited by patient focused on scheduling questions/concerns requiring care coordination with therapy team. Maintained low  60s dB during unstructured conversation, with verbal cues provided to increase volume. Required SLP A to bring cup and straw to mouth, no overt s/sx of aspiration with sequential straw sips.   05-17-23: Administered Communicative Effectiveness Survey (score 26).  - Rated a 2: being part of a noisy environment.  - Rated a 3: Participating in conversation with strangers in a quiet place, conversing with a stranger over the telephone, having a conversation while traveling in a car, and having a conversation with someone at a distance.   Targeted improving vocal quality and increasing intensity through Speak Out! Program lesson 1. SLP lead pt through exercises providing usual model prior to pt execution. Usual mod A required to achieve target dB this date. SLP developed modified Speak Out! Lesson to practice as daily HEP (see pt instructions) because pt does not have work book yet.  She did not bring email address today, so work book was not ordered, but pt was instructed to bring it next time. With prompting, pt generated 10 functional phrases to include in HEP. Averages this date:  Sustained "ah" 84 dB Glides 79 dB Counting 80 dB  Endorsed some occasional difficulty swallowing thin liquids. Reported self-regulating with open cup at home but unable to bring cup/straw to mouth independently over last 2 sessions. 1:1 assistance provided for all sips. Consecutive sips consumed with no overt s/sx of aspiration exhibited. Feeding difficulty appeared most related to positioning, in which SLP alerted OT of observations.    05/03/23 (eval day): Introduced [employer]! Program, work book and website. Demonstrated website. Completed modified Lesson one (due to time) - Selena Bush averaged 78dB on warm  up; 80dB on loud Ah, 70dB on glides, 70dB on counting. She does not recall her email, but she will bring it with her phone next session.   PATIENT EDUCATION: Education details: Speak Out! Exercise program, compensations for  cognition, xerostomia ed, See Today's Treatment, See patient instructions Person educated: Patient Education method: Explanation, Demonstration, Verbal cues, and Handouts Education comprehension: returned demonstration, verbal cues required, and needs further education  HOME EXERCISE PROGRAM: Speak Out! Need to order workbook next session when she brings her email   GOALS: Goals reviewed with patient? Yes  SHORT TERM GOALS: Target date: 05/31/23 (1 ST visit by STG date)  Complete Communicative Effectiveness Survey first 1-2 visits Baseline: Goal status: MET  2.  Pt will hit target dB on HEP with usual mod A         Baseline:  Goal status: NOT MET  3.  Pt will average 70dB 18/20 sentences with occasional min A Baseline:  Goal status: MET   4.  Pt will complete HEP twice a day consistently with occasional min A Baseline:  Goal status: NOT MET   5.  Pt will average 68 dB over 5 minute simple conversation with occasional min A Baseline:  Goal status: NOT MET  6.  Complete Bedside swallow eval and MBSS if indicated Baseline:  Goal status: PARTIALLY MET   LONG TERM GOALS: Target date: 07/26/23  Pt will complete HEP for dysarthria (Speak Out!) twice a day with rare min A Baseline:  Goal status: IN PROGRESS  2.  Pt will average 70dB over 15 minute conversation with rare min A Baseline:  Goal status: IN PROGRESS  3.  Pt will improve score on Communication Effectiveness Survey by 3 points Baseline:  Goal status: IN PROGRESS  4.  Pt will follow swallow precaution and diet modifications pending MBSS if indicated Baseline:  Goal status: IN PROGRESS  5.  Pt will access 2 contacts on her cell phone and emails with usual mod A  Baseline:  Goal status: IN PROGRESS  ASSESSMENT:  CLINICAL IMPRESSION: Patient is a 77 y.o. female who was seen today for ST tx related to PD and CVA. Conducted ongoing education and instruction of dysarthria strategies and Speak Out! program.  Continues to require occasional to usual cues to optimize volume during structured tasks and in conversation. No overt s/sx of aspiration exhibited with thin liquids trials; would benefit from re-education of using intent to optimize swallow function. I recommend skilled ST to maximize intelligibility for safety, independence, participation in social activities and safety of swallow.    OBJECTIVE IMPAIRMENTS: Objective impairments include memory, awareness, dysarthria, and dysphagia. These impairments are limiting patient from effectively communicating at home and in community.Factors affecting potential to achieve goals and functional outcome are medical prognosis and severity of impairments.. Patient will benefit from skilled SLP services to address above impairments and improve overall function.  REHAB POTENTIAL: Good  PLAN:  SLP FREQUENCY: 2x/week  SLP DURATION: 12 weeks  PLANNED INTERVENTIONS: Aspiration precaution training, Pharyngeal strengthening exercises, Diet toleration management , Language facilitation, Environmental controls, Trials of upgraded texture/liquids, Cueing hierachy, Cognitive reorganization, Internal/external aids, Functional tasks, Multimodal communication approach, SLP instruction and feedback, and Compensatory strategies, MBSS    Gracy Racer, CCC-SLP 06/07/2023, 2:54 PM

## 2023-06-07 NOTE — Therapy (Signed)
OUTPATIENT PHYSICAL THERAPY NEURO TREATMENT   Patient Name: SHATIKA PAPROCKI MRN: [id number] DOB:1946-08-07, 77 y.o., female Today's Date: 06/07/2023   PCP: Marny Lowenstein, MD REFERRING PROVIDER: Gillian Shields, NP    END OF SESSION:  PT End of Session - 06/07/23 1408     Visit Number 10    Number of Visits 17    Date for PT Re-Evaluation 06/16/23    Authorization Type [facility]    PT Start Time 1317    PT Stop Time 1400    PT Time Calculation (min) 43 min    Equipment Utilized During Treatment Gait belt    Activity Tolerance Patient tolerated treatment well    Behavior During Therapy WFL for tasks assessed/performed                 Past Medical History:  Diagnosis Date   Anxiety    Atrial fibrillation (HCC)    Cat bite of right hand 03/25/2019   Chronic diastolic CHF (congestive heart failure) (HCC)    a. echo 09/2014: EF 60-65%, diastolic dysfunction, mild LVH, nl RV size & systolic function, mildly dilated LA (4.3 cm), mild MR/TR, mildly elevated PASP 36.7 mm Hg   DDD (degenerative disc disease), cervical    DDD (degenerative disc disease), lumbar    Depression    Diffuse cystic mastopathy 2014   Dyspnea    Dysrhythmia    GERD (gastroesophageal reflux disease)    Gout    Headache    rare   HLD (hyperlipidemia)    a. statin intolerant 2/2 myalgias   Hx of dysplastic nevus 12/25/2018   L medial ankle   Hypercholesterolemia    Hypertension    Malignant neoplasm of upper-outer quadrant of female breast (HCC) 10/2012   Papillary DCIS, sentinel node negative. DR/PR positive. PARTIAL RIGHT MASTECTOMY FOR BREAST CANCER--HAD RADIATION - NO CHEMO --DR. CRYSTAL [place] ONCOLOGIST   Obesity    OSA on CPAP    Osteoarthritis of both knees    a. s/p right TKA 04/2013 & left TKA 09/2014   Otitis externa    Parkinson disease [facility])    Personal history of radiation therapy 2015   RIGHT breast-mammosite per pt   Pneumonia of both lungs due to infectious organism  01/08/2021   Sleep difficulties    LUNESTA HAS HELPED   Vaginal cyst    Past Surgical History:  Procedure Laterality Date   ABDOMINAL HYSTERECTOMY  1992   DUB; fibroids; endometriosis.  One remaining ovary.     BREAST LUMPECTOMY Right 2015   Papillary DCIS, sentinel node negative. DR/PR positive. PARTIAL RIGHT MASTECTOMY FOR BREAST CANCER--HAD RADIATION - NO CHEMO --DR. CRYSTAL [place] ONCOLOGIST   BREAST SURGERY Right 10/2012   Wide excision,APB RT 10 mm papillary DCIS, ER/PR positive. Sentinel node negative. Partial breast radiation.   CATARACT EXTRACTION, BILATERAL  02/13/2016   [facility].   CHOLECYSTECTOMY  1994   COLONOSCOPY  2015   1 benign polyp-every 5 years/ Dr Bluford Kaufmann   COLONOSCOPY WITH PROPOFOL N/A 02/10/2021   Procedure: COLONOSCOPY WITH PROPOFOL;  Surgeon: Earline Mayotte, MD;  Location: [facility];  Service: Endoscopy;  Laterality: N/A;   ERCP  1995   JOINT REPLACEMENT Right 04/2013   knee   PERICARDIOCENTESIS N/A 11/13/2017   Procedure: PERICARDIOCENTESIS;  Surgeon: Yvonne Kendall, MD;  Location: [facility] INVASIVE CV LAB;  Service: Cardiovascular;  Laterality: N/A;   TOTAL KNEE ARTHROPLASTY Right 04/16/2013   Procedure: RIGHT TOTAL KNEE ARTHROPLASTY;  Surgeon: Shelda Pal, MD;  Location: [facility] ORS;  Service: Orthopedics;  Laterality: Right;   TOTAL KNEE ARTHROPLASTY Left 09/15/2014   Procedure: LEFT TOTAL KNEE ARTHROPLASTY;  Surgeon: Shelda Pal, MD;  Location: [facility] ORS;  Service: Orthopedics;  Laterality: Left;   TUBAL LIGATION  1979   UPPER GI ENDOSCOPY     Patient Active Problem List   Diagnosis Date Noted   Cerebral vascular disease 10/10/2022   Gait abnormality 07/11/2022   Pressure injury of skin 06/05/2022   Rib fracture 06/04/2022   Stage 3a chronic kidney disease (CKD) (HCC) 06/04/2022   Skin ulcer with fat layer exposed (HCC) 03/16/2022   Vitamin B 12 deficiency 01/27/2022   Malaise 01/27/2022   Fever and chills 01/27/2022   Wheezing 01/27/2022    Vitamin D deficiency 01/27/2022   Hallucinations 11/24/2021   Abdominal pain 11/12/2021   Falls frequently 03/24/2021   Difficulty walking 04/04/2020   Hypophonia 01/15/2020   Numbness 12/28/2019   Left arm weakness 12/09/2019   Tremor 08/26/2019   Parkinsonism (HCC) 07/29/2019   DDD (degenerative disc disease), lumbar 10/19/2018   Preventative health care 10/19/2018   Prediabetes 10/19/2018   (HFpEF) heart failure with preserved ejection fraction (HCC) 12/12/2017   Pericardial effusion with cardiac tamponade 11/13/2017   Paroxysmal atrial fibrillation (HCC) 11/05/2017   Acute cough 05/30/2017   Anxiety 03/21/2016   Insomnia 07/17/2015   MI (mitral incompetence) 12/10/2014   Major depressive disorder 12/09/2014   OSA (obstructive sleep apnea) 12/09/2014   GERD (gastroesophageal reflux disease) 12/09/2014   Chronic diastolic CHF (congestive heart failure) (HCC)    HLD (hyperlipidemia)    Osteoarthritis of both knees    S/P left TKA 08/19/2014   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 (BMI) 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 (HCC) 04/17/2013   History of ductal carcinoma in situ (DCIS) of breast 11/05/2012   Essential hypertension, benign 11/05/2012    ONSET DATE: 02/14/2023 referral  REFERRING DIAG: Z86.73 (ICD-10-CM) - History of CVA (cerebrovascular accident) R53.81 (ICD-10-CM) - Physical deconditioning  THERAPY DIAG:  Abnormal posture  Muscle weakness (generalized)  Other abnormalities of gait and mobility  Other lack of coordination  Difficulty in walking, not elsewhere classified  Rationale for Evaluation and Treatment: Rehabilitation  SUBJECTIVE:                                                                                                                                                                                             SUBJECTIVE STATEMENT: Patient reports not doing well. Has not had a BM for > 1 week and is very uncomfortable. Feels a lot of  pressure in her abdomen  and feels like she could go now but did not want to miss PT. Denies pain.    Pt accompanied by: self   PERTINENT HISTORY: PD, CVA, Depression, arthritis, GERD, CA, CHF, HLD, HTN  PAIN:  Are you having pain? No  PRECAUTIONS: Fall and Other: R hemi  PATIENT GOALS: "I want to walk again"   OBJECTIVE:   DIAGNOSTIC FINDINGS: 01/13/23 MRI Brain IMPRESSION: 1. Suspected 5 mm early subacute infarct in the right precentral gyrus. 2. Severe chronic small vessel ischemic disease. New small chronic cortical infarcts in the left frontal and parietal lobes. 3. Progressive sinusitis.   TODAY'S TREATMENT:       GAIT -attempted gait with RW, MaxA x2 with encouragement for anterior weight shift  -added visual cues to aim B ASIS toward anterior frame of walker   -patient remains profoundly retropulsive  -in // bars:   -x2 lengths with close wc follow + grossly MaxA  -multimodal cuing to engage stance leg posterior chain for increased stability and upright posture   -able to advance R LE independently with PT facilitating lateral weight shifts  NMR -anterior weight shift onto physio ball with mirror for visual feedback on midline posturing  -PT facilitating gentle stretch to R SCM and upper trap + multimodal cuing for gentle cervical extension for midline  PATIENT EDUCATION: Education details: continue HEP Person educated: Patient Education method: Explanation and Handouts Education comprehension: verbalized understanding and needs further education  HOME EXERCISE PROGRAM: Access Code: [email or web address] URL: [email or web address] Date: 04/26/2023 Prepared by: Merry Lofty  Exercises - Supine Gluteal Sets  - 1 x daily - 7 x weekly - 3 sets - 10 reps - Supine Hip Abduction  - 1 x daily - 7 x weekly - 3 sets - 10 reps - Supine Heel Slide  - 1 x daily - 7 x weekly - 3 sets - 10 reps - Supine Ankle Pumps  - 1 x daily - 7 x weekly - 3 sets - 10 reps -  Seated Scapular Retraction  - 1 x daily - 7 x weekly - 3 sets - 10 reps - Supine Hamstring Stretch with Caregiver  - 1 x daily - 7 x weekly - 3 sets - 30s hold - Seated Hamstring Stretch with Chair  - 1 x daily - 7 x weekly - 3 sets - 30s hold  GOALS: Goals reviewed with patient? Yes  SHORT TERM GOALS: Target date: 05/05/23  Pt will be independent with initial HEP for improved functional strength Baseline: to be provided Goal status: MET  2.  Patient will complete sit <> stand with LRAD and MaxA x1  Baseline: Total A, max A to total A in // bars Goal status: IN PROGRESS  3.  Patient will initiate gait training Baseline: non-ambulatory; initiated 9/18  Goal status: MET  4.  Patient will maintain static sitting balance at EOB for at least 30s with no more than ModA Baseline: to be assessed; CGA-min A (9/13) Goal status: MET   LONG TERM GOALS: Target date: 06/16/23  Pt will be independent with final HEP for improved functional strength Baseline: to be provided Goal status: INITIAL  Patient will complete sit <> stand with LRAD and Mod x1  Baseline: Total A Goal status: INITIAL  Patient will ambulate at least 68ft in // bars with no more than ModA + wc follow as necessary Baseline: MaxA x63ft + wc follow Goal status: REVISED   Patient will maintain static sitting balance at EOB for at  least 60s with no more than MinA Baseline: to be assessed; CGA-min A (9/13) Goal status: MET  ASSESSMENT:  CLINICAL IMPRESSION: Patient seen for skilled PT session with emphasis on gait retraining and gross NMR. Progressing well with gait in // bars. Limited by B hip flexion contracture and R LE flexor tone+ adduction apparent during swing phase. Able to maintain midline posture, but when given dual task overlay, would resume R lateral flexion + rotation + flexion. Continue POC.    OBJECTIVE IMPAIRMENTS: cardiopulmonary status limiting activity, decreased activity tolerance, decreased balance,  decreased coordination, decreased endurance, decreased knowledge of condition, decreased knowledge of use of DME, decreased mobility, difficulty walking, decreased ROM, decreased strength, impaired perceived functional ability, impaired sensation, impaired tone, improper body mechanics, and postural dysfunction.   ACTIVITY LIMITATIONS: carrying, lifting, bending, sitting, standing, squatting, stairs, transfers, bed mobility, continence, bathing, toileting, dressing, self feeding, reach over head, hygiene/grooming, and locomotion level  PARTICIPATION LIMITATIONS: meal prep, cleaning, laundry, medication management, interpersonal relationship, shopping, and community activity  PERSONAL FACTORS: Age, Past/current experiences, Time since onset of injury/illness/exacerbation, Transportation, and 3+ comorbidities: see above  are also affecting patient's functional outcome.   REHAB POTENTIAL: Fair time since onset  CLINICAL DECISION MAKING: Evolving/moderate complexity  EVALUATION COMPLEXITY: Moderate  PLAN:  PT FREQUENCY: 2x/week  PT DURATION: 8 weeks  PLANNED INTERVENTIONS: Therapeutic exercises, Therapeutic activity, Neuromuscular re-education, Balance training, Gait training, Patient/Family education, Self Care, Joint mobilization, Stair training, Vestibular training, Visual/preceptual remediation/compensation, Orthotic/Fit training, DME instructions, Aquatic Therapy, Dry Needling, Manual therapy, and Re-evaluation  PLAN FOR NEXT SESSION: 10th visit progress note.sitting balance, STS, transfers, gait in // bars vs RW. Try priming anterior weight shift/ reciprocal coordination prior to gait   Westley Foots, PT, DPT, CBIS 06/07/2023, 2:23 PM

## 2023-06-09 ENCOUNTER — Ambulatory Visit: Payer: Medicare (Managed Care)

## 2023-06-09 ENCOUNTER — Ambulatory Visit: Payer: Medicare (Managed Care) | Admitting: Podiatry

## 2023-06-09 ENCOUNTER — Ambulatory Visit: Payer: Medicare (Managed Care) | Admitting: Physical Therapy

## 2023-06-14 ENCOUNTER — Ambulatory Visit: Payer: Medicare (Managed Care) | Admitting: Occupational Therapy

## 2023-06-14 ENCOUNTER — Ambulatory Visit: Payer: Medicare (Managed Care) | Admitting: Physical Therapy

## 2023-06-14 ENCOUNTER — Ambulatory Visit: Payer: Medicare (Managed Care)

## 2023-06-14 NOTE — Therapy (Signed)
[facility] [address] [place], [place], [address] Phone: [phone]   Fax:  [phone]  Patient Details  Name: Selena Bush MRN: [id number] Date of Birth: September 03, 1945 Referring Provider:  No ref. provider found  Encounter Date: 06/14/2023  PT called [facility] and spoke to Desmond Dike regarding patient no-show to all disciplines today and PT on Friday 10/25. Amy stating that wc appt on 10/25 was cancelled, which it was; however, it was rescheduled to a normal PT visit and patient was provided with new calendar to reflect this. As well, Amy stating that patients daughter, Herbert Seta, and the patient were aware that her last scheduled appointment here on 11/6 would be the end of her plan of care. This was also communicated to her care team here [facility] in an email sent on 10/15. However, patients daughter called the clinic earlier this week (10/28 or 10/29) stating that she was not aware of the above information. Amy then stating that she told Herbert Seta and Ms. Vannostrand that 11/6 would be her last appointment on 10/28, despite sending [place] an email on 10/15. Amy reporting that her no-shows to this clinic are related to transportation issues within [facility] and then transferred this PT to the transportation coordinator. The transportation coordinator did not have record of the patients appointments for today, despite Amy stating that they did. Amy made it very clear that Ms. Renfer would be continuing her PT/OT/ST at [facility] despite her rehab team stating that she did not have any rehab potential. Confirmed with transportation coordinator of Ms. Recupero's appointments 11/1, 11/4 and 11/6.   Westley Foots, PT Westley Foots, PT, DPT, CBIS  06/14/2023, 2:27 PM  [facility] [address] [place], [place], [address] Phone: [phone]   Fax:  [phone]

## 2023-06-16 ENCOUNTER — Ambulatory Visit: Payer: Medicare (Managed Care) | Attending: Family Medicine | Admitting: Physical Therapy

## 2023-06-16 DIAGNOSIS — R1312 Dysphagia, oropharyngeal phase: Secondary | ICD-10-CM | POA: Insufficient documentation

## 2023-06-16 DIAGNOSIS — R293 Abnormal posture: Secondary | ICD-10-CM | POA: Diagnosis present

## 2023-06-16 DIAGNOSIS — R262 Difficulty in walking, not elsewhere classified: Secondary | ICD-10-CM | POA: Diagnosis present

## 2023-06-16 DIAGNOSIS — M6281 Muscle weakness (generalized): Secondary | ICD-10-CM | POA: Insufficient documentation

## 2023-06-16 DIAGNOSIS — R278 Other lack of coordination: Secondary | ICD-10-CM | POA: Insufficient documentation

## 2023-06-16 DIAGNOSIS — R471 Dysarthria and anarthria: Secondary | ICD-10-CM | POA: Insufficient documentation

## 2023-06-16 DIAGNOSIS — R2689 Other abnormalities of gait and mobility: Secondary | ICD-10-CM | POA: Diagnosis present

## 2023-06-16 NOTE — Therapy (Signed)
OUTPATIENT PHYSICAL THERAPY NEURO TREATMENT - 10th VISIT PROGRESS NOTE   Patient Name: Selena Bush MRN: [id number] DOB:08-07-1946, 77 y.o., female Today's Date: 06/16/2023   PCP: Marny Lowenstein, MD REFERRING PROVIDER: Gillian Shields, NP  Physical Therapy Progress Note   Dates of Reporting Period: 04/06/2023 - 06/16/2023  See Note below for Objective Data and Assessment of Progress/Goals.  Thank you for the referral of this patient. Peter Congo, PT, DPT, CSRS   END OF SESSION:  PT End of Session - 06/16/23 1314     Visit Number 11    Number of Visits 17    Date for PT Re-Evaluation 06/16/23    Authorization Type [facility]    PT Start Time 1314    PT Stop Time 1400    PT Time Calculation (min) 46 min    Equipment Utilized During Treatment Gait belt    Activity Tolerance Patient tolerated treatment well    Behavior During Therapy WFL for tasks assessed/performed                  Past Medical History:  Diagnosis Date   Anxiety    Atrial fibrillation (HCC)    Cat bite of right hand 03/25/2019   Chronic diastolic CHF (congestive heart failure) (HCC)    a. echo 09/2014: EF 60-65%, diastolic dysfunction, mild LVH, nl RV size & systolic function, mildly dilated LA (4.3 cm), mild MR/TR, mildly elevated PASP 36.7 mm Hg   DDD (degenerative disc disease), cervical    DDD (degenerative disc disease), lumbar    Depression    Diffuse cystic mastopathy 2014   Dyspnea    Dysrhythmia    GERD (gastroesophageal reflux disease)    Gout    Headache    rare   HLD (hyperlipidemia)    a. statin intolerant 2/2 myalgias   Hx of dysplastic nevus 12/25/2018   L medial ankle   Hypercholesterolemia    Hypertension    Malignant neoplasm of upper-outer quadrant of female breast (HCC) 10/2012   Papillary DCIS, sentinel node negative. DR/PR positive. PARTIAL RIGHT MASTECTOMY FOR BREAST CANCER--HAD RADIATION - NO CHEMO --DR. CRYSTAL [place] ONCOLOGIST   Obesity    OSA on CPAP     Osteoarthritis of both knees    a. s/p right TKA 04/2013 & left TKA 09/2014   Otitis externa    Parkinson disease [facility])    Personal history of radiation therapy 2015   RIGHT breast-mammosite per pt   Pneumonia of both lungs due to infectious organism 01/08/2021   Sleep difficulties    LUNESTA HAS HELPED   Vaginal cyst    Past Surgical History:  Procedure Laterality Date   ABDOMINAL HYSTERECTOMY  1992   DUB; fibroids; endometriosis.  One remaining ovary.     BREAST LUMPECTOMY Right 2015   Papillary DCIS, sentinel node negative. DR/PR positive. PARTIAL RIGHT MASTECTOMY FOR BREAST CANCER--HAD RADIATION - NO CHEMO --DR. CRYSTAL [place] ONCOLOGIST   BREAST SURGERY Right 10/2012   Wide excision,APB RT 10 mm papillary DCIS, ER/PR positive. Sentinel node negative. Partial breast radiation.   CATARACT EXTRACTION, BILATERAL  02/13/2016   [facility].   CHOLECYSTECTOMY  1994   COLONOSCOPY  2015   1 benign polyp-every 5 years/ Dr Bluford Kaufmann   COLONOSCOPY WITH PROPOFOL N/A 02/10/2021   Procedure: COLONOSCOPY WITH PROPOFOL;  Surgeon: Earline Mayotte, MD;  Location: [facility] ENDOSCOPY;  Service: Endoscopy;  Laterality: N/A;   ERCP  1995   JOINT REPLACEMENT Right 04/2013   knee  PERICARDIOCENTESIS N/A 11/13/2017   Procedure: PERICARDIOCENTESIS;  Surgeon: Yvonne Kendall, MD;  Location: [facility] INVASIVE CV LAB;  Service: Cardiovascular;  Laterality: N/A;   TOTAL KNEE ARTHROPLASTY Right 04/16/2013   Procedure: RIGHT TOTAL KNEE ARTHROPLASTY;  Surgeon: Shelda Pal, MD;  Location: [facility] ORS;  Service: Orthopedics;  Laterality: Right;   TOTAL KNEE ARTHROPLASTY Left 09/15/2014   Procedure: LEFT TOTAL KNEE ARTHROPLASTY;  Surgeon: Shelda Pal, MD;  Location: [facility] ORS;  Service: Orthopedics;  Laterality: Left;   TUBAL LIGATION  1979   UPPER GI ENDOSCOPY     Patient Active Problem List   Diagnosis Date Noted   Cerebral vascular disease 10/10/2022   Gait abnormality 07/11/2022   Pressure injury of skin 06/05/2022    Rib fracture 06/04/2022   Stage 3a chronic kidney disease (CKD) (HCC) 06/04/2022   Skin ulcer with fat layer exposed (HCC) 03/16/2022   Vitamin B 12 deficiency 01/27/2022   Malaise 01/27/2022   Fever and chills 01/27/2022   Wheezing 01/27/2022   Vitamin D deficiency 01/27/2022   Hallucinations 11/24/2021   Abdominal pain 11/12/2021   Falls frequently 03/24/2021   Difficulty walking 04/04/2020   Hypophonia 01/15/2020   Numbness 12/28/2019   Left arm weakness 12/09/2019   Tremor 08/26/2019   Parkinsonism (HCC) 07/29/2019   DDD (degenerative disc disease), lumbar 10/19/2018   Preventative health care 10/19/2018   Prediabetes 10/19/2018   (HFpEF) heart failure with preserved ejection fraction (HCC) 12/12/2017   Pericardial effusion with cardiac tamponade 11/13/2017   Paroxysmal atrial fibrillation (HCC) 11/05/2017   Acute cough 05/30/2017   Anxiety 03/21/2016   Insomnia 07/17/2015   MI (mitral incompetence) 12/10/2014   Major depressive disorder 12/09/2014   OSA (obstructive sleep apnea) 12/09/2014   GERD (gastroesophageal reflux disease) 12/09/2014   Chronic diastolic CHF (congestive heart failure) (HCC)    HLD (hyperlipidemia)    Osteoarthritis of both knees    S/P left TKA 08/19/2014   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 (BMI) 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 (HCC) 04/17/2013   History of ductal carcinoma in situ (DCIS) of breast 11/05/2012   Essential hypertension, benign 11/05/2012    ONSET DATE: 02/14/2023 referral  REFERRING DIAG: Z86.73 (ICD-10-CM) - History of CVA (cerebrovascular accident) R53.81 (ICD-10-CM) - Physical deconditioning  THERAPY DIAG:  Muscle weakness (generalized)  Abnormal posture  Other abnormalities of gait and mobility  Difficulty in walking, not elsewhere classified  Rationale for Evaluation and Treatment: Rehabilitation  SUBJECTIVE:  SUBJECTIVE STATEMENT: Pt declines any acute changes since last visit, no complaints of pain today. Pt with several bandages on her fingers and forearms due to skin tears.  Pt accompanied by: self   PERTINENT HISTORY: PD, CVA, Depression, arthritis, GERD, CA, CHF, HLD, HTN  PAIN:  Are you having pain? No  PRECAUTIONS: Fall and Other: R hemi  PATIENT GOALS: "I want to walk again"   OBJECTIVE:   DIAGNOSTIC FINDINGS: 01/13/23 MRI Brain IMPRESSION: 1. Suspected 5 mm early subacute infarct in the right precentral gyrus. 2. Severe chronic small vessel ischemic disease. New small chronic cortical infarcts in the left frontal and parietal lobes. 3. Progressive sinusitis.   TODAY'S TREATMENT:       GAIT Sit to stand in // bars with max A for anterior lean. Ambulation 3 x 10 ft in // bars with mod A for balance, assist x 2 for BUE management on // bars and to keep hips forwards, and w/c follow for safety. Pt exhibits decreased step length with LLE as compared to R with scissoring of limb and narrow BOS. Pt needs cues to keep hips extended and to maintain midline trunk during gait.  NMR SciFit level 3 for 5 minutes using BUE/BLEs for neural priming for reciprocal movement, dynamic cardiovascular warmup and increased amplitude of stepping. RPE of 5/10 following activity.  Seated core strengthening and sitting balance while seated in w/c with no back support reaching outside BOS, across midline, and forwards for targets. Pt needs min to mod A tactile cues to increase anterior and lateral lean in sitting position.    PATIENT EDUCATION: Education details: continue HEP, PT POC Person educated: Patient Education method: Explanation Education comprehension: verbalized understanding and needs further education  HOME EXERCISE PROGRAM: Access Code: [email or web address] URL: [email or web address] Date:  04/26/2023 Prepared by: Merry Lofty  Exercises - Supine Gluteal Sets  - 1 x daily - 7 x weekly - 3 sets - 10 reps - Supine Hip Abduction  - 1 x daily - 7 x weekly - 3 sets - 10 reps - Supine Heel Slide  - 1 x daily - 7 x weekly - 3 sets - 10 reps - Supine Ankle Pumps  - 1 x daily - 7 x weekly - 3 sets - 10 reps - Seated Scapular Retraction  - 1 x daily - 7 x weekly - 3 sets - 10 reps - Supine Hamstring Stretch with Caregiver  - 1 x daily - 7 x weekly - 3 sets - 30s hold - Seated Hamstring Stretch with Chair  - 1 x daily - 7 x weekly - 3 sets - 30s hold  GOALS: Goals reviewed with patient? Yes  SHORT TERM GOALS: Target date: 05/05/23  Pt will be independent with initial HEP for improved functional strength Baseline: to be provided Goal status: MET  2.  Patient will complete sit <> stand with LRAD and MaxA x1  Baseline: Total A, max A to total A in // bars Goal status: IN PROGRESS  3.  Patient will initiate gait training Baseline: non-ambulatory; initiated 9/18  Goal status: MET  4.  Patient will maintain static sitting balance at EOB for at least 30s with no more than ModA Baseline: to be assessed; CGA-min A (9/13) Goal status: MET   LONG TERM GOALS: Target date: 06/21/23 (updated to match last scheduled appointment within cert)  Pt will be independent with final HEP for improved functional strength Baseline: to be provided Goal status: INITIAL  Patient will complete sit <>  stand with LRAD and Mod x1  Baseline: Total A, max A (11/1) Goal status: INITIAL  Patient will ambulate at least 55ft in // bars with no more than ModA + wc follow as necessary Baseline: MaxA x2ft + wc follow Goal status: REVISED   Patient will maintain static sitting balance at EOB for at least 60s with no more than MinA Baseline: to be assessed; CGA-min A (9/13) Goal status: MET  ASSESSMENT:  CLINICAL IMPRESSION: Emphasis of skilled PT session on priming BLE on SciFit followed by gait  training in // bars. Pt exhibits improved ability to perform sit to stands and take steps in // bars compared to previously observed by this therapist. Pt does fatigue quickly and exhibits decreased step length and increased scissoring of LLE with onset of fatigue. Pt also continues to exhibited kyphotic posture with cervical flexion and needs tactile and verbal cues for more upright posture during functional tasks. Pt continues to benefit from skilled therapy services to work towards increased safety and independence with functional mobility. Continue POC.    OBJECTIVE IMPAIRMENTS: cardiopulmonary status limiting activity, decreased activity tolerance, decreased balance, decreased coordination, decreased endurance, decreased knowledge of condition, decreased knowledge of use of DME, decreased mobility, difficulty walking, decreased ROM, decreased strength, impaired perceived functional ability, impaired sensation, impaired tone, improper body mechanics, and postural dysfunction.   ACTIVITY LIMITATIONS: carrying, lifting, bending, sitting, standing, squatting, stairs, transfers, bed mobility, continence, bathing, toileting, dressing, self feeding, reach over head, hygiene/grooming, and locomotion level  PARTICIPATION LIMITATIONS: meal prep, cleaning, laundry, medication management, interpersonal relationship, shopping, and community activity  PERSONAL FACTORS: Age, Past/current experiences, Time since onset of injury/illness/exacerbation, Transportation, and 3+ comorbidities: see above  are also affecting patient's functional outcome.   REHAB POTENTIAL: Fair time since onset  CLINICAL DECISION MAKING: Evolving/moderate complexity  EVALUATION COMPLEXITY: Moderate  PLAN:  PT FREQUENCY: 2x/week  PT DURATION: 8 weeks  PLANNED INTERVENTIONS: Therapeutic exercises, Therapeutic activity, Neuromuscular re-education, Balance training, Gait training, Patient/Family education, Self Care, Joint  mobilization, Stair training, Vestibular training, Visual/preceptual remediation/compensation, Orthotic/Fit training, DME instructions, Aquatic Therapy, Dry Needling, Manual therapy, and Re-evaluation  PLAN FOR NEXT SESSION: sitting balance, STS, transfers, gait in // bars vs RW. Try priming anterior weight shift/ reciprocal coordination prior to gait   Peter Congo, PT, DPT, CSRS  06/16/2023, 3:33 PM

## 2023-06-19 ENCOUNTER — Ambulatory Visit: Payer: Medicare (Managed Care) | Admitting: Physical Therapy

## 2023-06-19 DIAGNOSIS — R293 Abnormal posture: Secondary | ICD-10-CM

## 2023-06-19 DIAGNOSIS — R2689 Other abnormalities of gait and mobility: Secondary | ICD-10-CM

## 2023-06-19 DIAGNOSIS — M6281 Muscle weakness (generalized): Secondary | ICD-10-CM

## 2023-06-19 DIAGNOSIS — R278 Other lack of coordination: Secondary | ICD-10-CM

## 2023-06-19 NOTE — Therapy (Signed)
OUTPATIENT PHYSICAL THERAPY NEURO TREATMENT    Patient Name: Selena Bush MRN: [id number] DOB:1946-08-14, 77 y.o., female Today's Date: 06/19/2023   PCP: Marny Lowenstein, MD REFERRING PROVIDER: Gillian Shields, NP    END OF SESSION:  PT End of Session - 06/19/23 1234     Visit Number 12    Number of Visits 17    Date for PT Re-Evaluation 06/16/23    Authorization Type PACE    PT Start Time 1232    PT Stop Time 1320    PT Time Calculation (min) 48 min    Equipment Utilized During Treatment Gait belt    Activity Tolerance Patient tolerated treatment well    Behavior During Therapy WFL for tasks assessed/performed                   Past Medical History:  Diagnosis Date   Anxiety    Atrial fibrillation (HCC)    Cat bite of right hand 03/25/2019   Chronic diastolic CHF (congestive heart failure) (HCC)    a. echo 09/2014: EF 60-65%, diastolic dysfunction, mild LVH, nl RV size & systolic function, mildly dilated LA (4.3 cm), mild MR/TR, mildly elevated PASP 36.7 mm Hg   DDD (degenerative disc disease), cervical    DDD (degenerative disc disease), lumbar    Depression    Diffuse cystic mastopathy 2014   Dyspnea    Dysrhythmia    GERD (gastroesophageal reflux disease)    Gout    Headache    rare   HLD (hyperlipidemia)    a. statin intolerant 2/2 myalgias   Hx of dysplastic nevus 12/25/2018   L medial ankle   Hypercholesterolemia    Hypertension    Malignant neoplasm of upper-outer quadrant of female breast (HCC) 10/2012   Papillary DCIS, sentinel node negative. DR/PR positive. PARTIAL RIGHT MASTECTOMY FOR BREAST CANCER--HAD RADIATION - NO CHEMO --DR. CRYSTAL [place] ONCOLOGIST   Obesity    OSA on CPAP    Osteoarthritis of both knees    a. s/p right TKA 04/2013 & left TKA 09/2014   Otitis externa    Parkinson disease [facility])    Personal history of radiation therapy 2015   RIGHT breast-mammosite per pt   Pneumonia of both lungs due to infectious organism  01/08/2021   Sleep difficulties    LUNESTA HAS HELPED   Vaginal cyst    Past Surgical History:  Procedure Laterality Date   ABDOMINAL HYSTERECTOMY  1992   DUB; fibroids; endometriosis.  One remaining ovary.     BREAST LUMPECTOMY Right 2015   Papillary DCIS, sentinel node negative. DR/PR positive. PARTIAL RIGHT MASTECTOMY FOR BREAST CANCER--HAD RADIATION - NO CHEMO --DR. CRYSTAL [place] ONCOLOGIST   BREAST SURGERY Right 10/2012   Wide excision,APB RT 10 mm papillary DCIS, ER/PR positive. Sentinel node negative. Partial breast radiation.   CATARACT EXTRACTION, BILATERAL  02/13/2016   [facility]avis.   CHOLECYSTECTOMY  1994   COLONOSCOPY  2015   1 benign polyp-every 5 years/ Dr Bluford Kaufmann   COLONOSCOPY WITH PROPOFOL N/A 02/10/2021   Procedure: COLONOSCOPY WITH PROPOFOL;  Surgeon: Earline Mayotte, MD;  Location: [facility] ENDOSCOPY;  Service: Endoscopy;  Laterality: N/A;   ERCP  1995   JOINT REPLACEMENT Right 04/2013   knee   PERICARDIOCENTESIS N/A 11/13/2017   Procedure: PERICARDIOCENTESIS;  Surgeon: Yvonne Kendall, MD;  Location: [facility] INVASIVE CV LAB;  Service: Cardiovascular;  Laterality: N/A;   TOTAL KNEE ARTHROPLASTY Right 04/16/2013   Procedure: RIGHT TOTAL KNEE ARTHROPLASTY;  Surgeon: Molli Hazard  Rosalia Hammers, MD;  Location: [facility] ORS;  Service: Orthopedics;  Laterality: Right;   TOTAL KNEE ARTHROPLASTY Left 09/15/2014   Procedure: LEFT TOTAL KNEE ARTHROPLASTY;  Surgeon: Shelda Pal, MD;  Location: [facility] ORS;  Service: Orthopedics;  Laterality: Left;   TUBAL LIGATION  1979   UPPER GI ENDOSCOPY     Patient Active Problem List   Diagnosis Date Noted   Cerebral vascular disease 10/10/2022   Gait abnormality 07/11/2022   Pressure injury of skin 06/05/2022   Rib fracture 06/04/2022   Stage 3a chronic kidney disease (CKD) (HCC) 06/04/2022   Skin ulcer with fat layer exposed (HCC) 03/16/2022   Vitamin B 12 deficiency 01/27/2022   Malaise 01/27/2022   Fever and chills 01/27/2022   Wheezing 01/27/2022    Vitamin D deficiency 01/27/2022   Hallucinations 11/24/2021   Abdominal pain 11/12/2021   Falls frequently 03/24/2021   Difficulty walking 04/04/2020   Hypophonia 01/15/2020   Numbness 12/28/2019   Left arm weakness 12/09/2019   Tremor 08/26/2019   Parkinsonism (HCC) 07/29/2019   DDD (degenerative disc disease), lumbar 10/19/2018   Preventative health care 10/19/2018   Prediabetes 10/19/2018   (HFpEF) heart failure with preserved ejection fraction (HCC) 12/12/2017   Pericardial effusion with cardiac tamponade 11/13/2017   Paroxysmal atrial fibrillation (HCC) 11/05/2017   Acute cough 05/30/2017   Anxiety 03/21/2016   Insomnia 07/17/2015   MI (mitral incompetence) 12/10/2014   Major depressive disorder 12/09/2014   OSA (obstructive sleep apnea) 12/09/2014   GERD (gastroesophageal reflux disease) 12/09/2014   Chronic diastolic CHF (congestive heart failure) (HCC)    HLD (hyperlipidemia)    Osteoarthritis of both knees    S/P left TKA 08/19/2014   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 (BMI) 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 (HCC) 04/17/2013   History of ductal carcinoma in situ (DCIS) of breast 11/05/2012   Essential hypertension, benign 11/05/2012    ONSET DATE: 02/14/2023 referral  REFERRING DIAG: Z86.73 (ICD-10-CM) - History of CVA (cerebrovascular accident) R53.81 (ICD-10-CM) - Physical deconditioning  THERAPY DIAG:  Muscle weakness (generalized)  Abnormal posture  Other abnormalities of gait and mobility  Other lack of coordination  Rationale for Evaluation and Treatment: Rehabilitation  SUBJECTIVE:                                                                                                                                                                                             SUBJECTIVE STATEMENT: Pt reports doing well. Denies pain or acute changes. Still wearing several bandages on hands and forearms due to skin tears.   Pt accompanied by: self  PERTINENT HISTORY: PD, CVA, Depression, arthritis, GERD, CA, CHF, HLD, HTN  PAIN:  Are you having pain? No  PRECAUTIONS: Fall and Other: R hemi  PATIENT GOALS: "I want to walk again"   OBJECTIVE:   DIAGNOSTIC FINDINGS: 01/13/23 MRI Brain IMPRESSION: 1. Suspected 5 mm early subacute infarct in the right precentral gyrus. 2. Severe chronic small vessel ischemic disease. New small chronic cortical infarcts in the left frontal and parietal lobes. 3. Progressive sinusitis.   TODAY'S TREATMENT:       NMR SciFit manual level 1 for 6 minutes using BUE/BLEs for neural priming for reciprocal movement, dynamic cardiovascular warmup and increased amplitude of stepping. Min cues for increased step length w/RLE throughout. RPE of 3-4/10 following activity.   GAIT TRAINING  In // bars w/three therapists for improved BLE strength, transfers, pressure relief and improved ROM:  Pt performs sit to stands x2 w/mod multimodal cues to facilitate anterior weight shift and min-mod Ax2.  Pt ambulated 6' x2 w/BUE support and mod A x3 for lateral weight shifting, retropulsion correction, bilateral knee block, sequencing and proper hand placement. Pt initially assuming very narrow BOS, resulting in difficulty w/lateral weightshift. On second set, cued pt for wider BOS and pt able to facilitate w/verbal cues only.  With RW, pt ambulated 10' x2 w/max A x2 and close WC follow. Pt w/increased crouched posture w/fatigue. Pt reported feeling unsteady when stepping w/LLE due to decreased stability on RLE. While ambulating, noted bloody bandage on posterolateral LLE that had seeped onto pt's white sock. Pt reports the bandage was applied last Friday and needed to be changed. Informed pt to have bandage changed today when she returned to [facility], pt verbalized understanding.     PATIENT EDUCATION: Education details: continue HEP, PT POC Person educated: Patient Education method: Explanation Education comprehension:  verbalized understanding and needs further education  HOME EXERCISE PROGRAM: Access Code: [email or web address] URL: [email or web address] Date: 04/26/2023 Prepared by: Merry Lofty  Exercises - Supine Gluteal Sets  - 1 x daily - 7 x weekly - 3 sets - 10 reps - Supine Hip Abduction  - 1 x daily - 7 x weekly - 3 sets - 10 reps - Supine Heel Slide  - 1 x daily - 7 x weekly - 3 sets - 10 reps - Supine Ankle Pumps  - 1 x daily - 7 x weekly - 3 sets - 10 reps - Seated Scapular Retraction  - 1 x daily - 7 x weekly - 3 sets - 10 reps - Supine Hamstring Stretch with Caregiver  - 1 x daily - 7 x weekly - 3 sets - 30s hold - Seated Hamstring Stretch with Chair  - 1 x daily - 7 x weekly - 3 sets - 30s hold  GOALS: Goals reviewed with patient? Yes  SHORT TERM GOALS: Target date: 05/05/23  Pt will be independent with initial HEP for improved functional strength Baseline: to be provided Goal status: MET  2.  Patient will complete sit <> stand with LRAD and MaxA x1  Baseline: Total A, max A to total A in // bars Goal status: IN PROGRESS  3.  Patient will initiate gait training Baseline: non-ambulatory; initiated 9/18  Goal status: MET  4.  Patient will maintain static sitting balance at EOB for at least 30s with no more than ModA Baseline: to be assessed; CGA-min A (9/13) Goal status: MET   LONG TERM GOALS: Target date: 06/21/23 (updated to match last scheduled appointment within cert)  Pt will  be independent with final HEP for improved functional strength Baseline: to be provided Goal status: INITIAL  Patient will complete sit <> stand with LRAD and Mod x1  Baseline: Total A, max A (11/1) Goal status: INITIAL  Patient will ambulate at least 42ft in // bars with no more than ModA + wc follow as necessary Baseline: MaxA x85ft + wc follow Goal status: REVISED   Patient will maintain static sitting balance at EOB for at least 60s with no more than MinA Baseline: to be assessed;  CGA-min A (9/13) Goal status: MET  ASSESSMENT:  CLINICAL IMPRESSION: Emphasis of skilled PT session on priming BLE on SciFit followed by gait training in // bars and w/RW. Pt able to teach back proper sit to stand sequence this date w/improved anterior weight shift noted. Pt continues to be limited by bilateral hip and knee flexion contractures as well as R ankle inversion contracture that has continued to worsen during POC due to ill-fitting WC and improper positioning. Pt demonstrated increased step length/clearance this date w/verbal cues for hip abduction and tactile cues applied to pelvis for lateral weight shifting. Continue POC.    OBJECTIVE IMPAIRMENTS: cardiopulmonary status limiting activity, decreased activity tolerance, decreased balance, decreased coordination, decreased endurance, decreased knowledge of condition, decreased knowledge of use of DME, decreased mobility, difficulty walking, decreased ROM, decreased strength, impaired perceived functional ability, impaired sensation, impaired tone, improper body mechanics, and postural dysfunction.   ACTIVITY LIMITATIONS: carrying, lifting, bending, sitting, standing, squatting, stairs, transfers, bed mobility, continence, bathing, toileting, dressing, self feeding, reach over head, hygiene/grooming, and locomotion level  PARTICIPATION LIMITATIONS: meal prep, cleaning, laundry, medication management, interpersonal relationship, shopping, and community activity  PERSONAL FACTORS: Age, Past/current experiences, Time since onset of injury/illness/exacerbation, Transportation, and 3+ comorbidities: see above  are also affecting patient's functional outcome.   REHAB POTENTIAL: Fair time since onset  CLINICAL DECISION MAKING: Evolving/moderate complexity  EVALUATION COMPLEXITY: Moderate  PLAN:  PT FREQUENCY: 2x/week  PT DURATION: 8 weeks  PLANNED INTERVENTIONS: Therapeutic exercises, Therapeutic activity, Neuromuscular re-education,  Balance training, Gait training, Patient/Family education, Self Care, Joint mobilization, Stair training, Vestibular training, Visual/preceptual remediation/compensation, Orthotic/Fit training, DME instructions, Aquatic Therapy, Dry Needling, Manual therapy, and Re-evaluation  PLAN FOR NEXT SESSION: goals and DC  Jill Alexanders Halana Deisher, PT, DPT [facility] [address] [place], [place]  [address] Phone:  [phone] Fax:  [phone]  06/19/2023, 2:38 PM

## 2023-06-21 ENCOUNTER — Ambulatory Visit: Payer: Medicare (Managed Care) | Admitting: Occupational Therapy

## 2023-06-21 ENCOUNTER — Encounter: Payer: Self-pay | Admitting: Speech Pathology

## 2023-06-21 ENCOUNTER — Ambulatory Visit: Payer: Medicare (Managed Care) | Admitting: Physical Therapy

## 2023-06-21 ENCOUNTER — Ambulatory Visit: Payer: Medicare (Managed Care) | Admitting: Speech Pathology

## 2023-06-21 DIAGNOSIS — R1312 Dysphagia, oropharyngeal phase: Secondary | ICD-10-CM

## 2023-06-21 DIAGNOSIS — M6281 Muscle weakness (generalized): Secondary | ICD-10-CM | POA: Diagnosis not present

## 2023-06-21 DIAGNOSIS — R2689 Other abnormalities of gait and mobility: Secondary | ICD-10-CM

## 2023-06-21 DIAGNOSIS — R471 Dysarthria and anarthria: Secondary | ICD-10-CM

## 2023-06-21 DIAGNOSIS — R293 Abnormal posture: Secondary | ICD-10-CM

## 2023-06-21 DIAGNOSIS — R262 Difficulty in walking, not elsewhere classified: Secondary | ICD-10-CM

## 2023-06-21 DIAGNOSIS — R278 Other lack of coordination: Secondary | ICD-10-CM

## 2023-06-21 NOTE — Therapy (Signed)
13 OUTPATIENT PHYSICAL THERAPY NEURO TREATMENT - DISCHARGE NOTE   Patient Name: Selena Bush MRN: [id number] DOB:Nov 30, 1945, 77 y.o., female Today's Date: 06/21/2023   PCP: Marny Lowenstein, MD REFERRING PROVIDER: Gillian Shields, NP  PHYSICAL THERAPY DISCHARGE SUMMARY  Visits from Start of Care: 13  Current functional level related to goals / functional outcomes: Max A to stand in // bars, max +2 to stand to RW, max A/+2 to perform slide board transfers, +2 for short distance gait in // bars or with RW   Remaining deficits: Decreased BUE and BLE strength, impaired core stability/control, impaired standing balance, B knee flexion contractures   Education / Equipment: Handout for HEP   Patient agrees to discharge. Patient goals were partially met. Patient is being discharged due to  [facility] no longer approving visits in this setting at this clinic.     END OF SESSION:  PT End of Session - 06/21/23 1449     Visit Number 13    Number of Visits 17    Date for PT Re-Evaluation 06/16/23    Authorization Type [facility]    PT Start Time 1448    PT Stop Time 1526    PT Time Calculation (min) 38 min    Equipment Utilized During Treatment Gait belt    Activity Tolerance Patient tolerated treatment well    Behavior During Therapy WFL for tasks assessed/performed                   Past Medical History:  Diagnosis Date   Anxiety    Atrial fibrillation (HCC)    Cat bite of right hand 03/25/2019   Chronic diastolic CHF (congestive heart failure) (HCC)    a. echo 09/2014: EF 60-65%, diastolic dysfunction, mild LVH, nl RV size & systolic function, mildly dilated LA (4.3 cm), mild MR/TR, mildly elevated PASP 36.7 mm Hg   DDD (degenerative disc disease), cervical    DDD (degenerative disc disease), lumbar    Depression    Diffuse cystic mastopathy 2014   Dyspnea    Dysrhythmia    GERD (gastroesophageal reflux disease)    Gout    Headache    rare   HLD (hyperlipidemia)    a.  statin intolerant 2/2 myalgias   Hx of dysplastic nevus 12/25/2018   L medial ankle   Hypercholesterolemia    Hypertension    Malignant neoplasm of upper-outer quadrant of female breast (HCC) 10/2012   Papillary DCIS, sentinel node negative. DR/PR positive. PARTIAL RIGHT MASTECTOMY FOR BREAST CANCER--HAD RADIATION - NO CHEMO --DR. CRYSTAL [place] ONCOLOGIST   Obesity    OSA on CPAP    Osteoarthritis of both knees    a. s/p right TKA 04/2013 & left TKA 09/2014   Otitis externa    Parkinson disease [facility])    Personal history of radiation therapy 2015   RIGHT breast-mammosite per pt   Pneumonia of both lungs due to infectious organism 01/08/2021   Sleep difficulties    LUNESTA HAS HELPED   Vaginal cyst    Past Surgical History:  Procedure Laterality Date   ABDOMINAL HYSTERECTOMY  1992   DUB; fibroids; endometriosis.  One remaining ovary.     BREAST LUMPECTOMY Right 2015   Papillary DCIS, sentinel node negative. DR/PR positive. PARTIAL RIGHT MASTECTOMY FOR BREAST CANCER--HAD RADIATION - NO CHEMO --DR. CRYSTAL [place] ONCOLOGIST   BREAST SURGERY Right 10/2012   Wide excision,APB RT 10 mm papillary DCIS, ER/PR positive. Sentinel node negative. Partial breast radiation.  CATARACT EXTRACTION, BILATERAL  02/13/2016   [facility].   CHOLECYSTECTOMY  1994   COLONOSCOPY  2015   1 benign polyp-every 5 years/ Dr Bluford Kaufmann   COLONOSCOPY WITH PROPOFOL N/A 02/10/2021   Procedure: COLONOSCOPY WITH PROPOFOL;  Surgeon: Earline Mayotte, MD;  Location: [facility] ENDOSCOPY;  Service: Endoscopy;  Laterality: N/A;   ERCP  1995   JOINT REPLACEMENT Right 04/2013   knee   PERICARDIOCENTESIS N/A 11/13/2017   Procedure: PERICARDIOCENTESIS;  Surgeon: Yvonne Kendall, MD;  Location: [facility] INVASIVE CV LAB;  Service: Cardiovascular;  Laterality: N/A;   TOTAL KNEE ARTHROPLASTY Right 04/16/2013   Procedure: RIGHT TOTAL KNEE ARTHROPLASTY;  Surgeon: Shelda Pal, MD;  Location: [facility] ORS;  Service: Orthopedics;  Laterality:  Right;   TOTAL KNEE ARTHROPLASTY Left 09/15/2014   Procedure: LEFT TOTAL KNEE ARTHROPLASTY;  Surgeon: Shelda Pal, MD;  Location: [facility] ORS;  Service: Orthopedics;  Laterality: Left;   TUBAL LIGATION  1979   UPPER GI ENDOSCOPY     Patient Active Problem List   Diagnosis Date Noted   Cerebral vascular disease 10/10/2022   Gait abnormality 07/11/2022   Pressure injury of skin 06/05/2022   Rib fracture 06/04/2022   Stage 3a chronic kidney disease (CKD) (HCC) 06/04/2022   Skin ulcer with fat layer exposed (HCC) 03/16/2022   Vitamin B 12 deficiency 01/27/2022   Malaise 01/27/2022   Fever and chills 01/27/2022   Wheezing 01/27/2022   Vitamin D deficiency 01/27/2022   Hallucinations 11/24/2021   Abdominal pain 11/12/2021   Falls frequently 03/24/2021   Difficulty walking 04/04/2020   Hypophonia 01/15/2020   Numbness 12/28/2019   Left arm weakness 12/09/2019   Tremor 08/26/2019   Parkinsonism (HCC) 07/29/2019   DDD (degenerative disc disease), lumbar 10/19/2018   Preventative health care 10/19/2018   Prediabetes 10/19/2018   (HFpEF) heart failure with preserved ejection fraction (HCC) 12/12/2017   Pericardial effusion with cardiac tamponade 11/13/2017   Paroxysmal atrial fibrillation (HCC) 11/05/2017   Acute cough 05/30/2017   Anxiety 03/21/2016   Insomnia 07/17/2015   MI (mitral incompetence) 12/10/2014   Major depressive disorder 12/09/2014   OSA (obstructive sleep apnea) 12/09/2014   GERD (gastroesophageal reflux disease) 12/09/2014   Chronic diastolic CHF (congestive heart failure) (HCC)    HLD (hyperlipidemia)    Osteoarthritis of both knees    S/P left TKA 08/19/2014   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 (BMI) 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 (HCC) 04/17/2013   History of ductal carcinoma in situ (DCIS) of breast 11/05/2012   Essential hypertension, benign 11/05/2012    ONSET DATE: 02/14/2023 referral  REFERRING DIAG: Z86.73 (ICD-10-CM) - History of CVA  (cerebrovascular accident) R53.81 (ICD-10-CM) - Physical deconditioning  THERAPY DIAG:  Muscle weakness (generalized)  Abnormal posture  Other abnormalities of gait and mobility  Difficulty in walking, not elsewhere classified  Rationale for Evaluation and Treatment: Rehabilitation  SUBJECTIVE:  SUBJECTIVE STATEMENT: Pt reports doing well. Denies pain or acute changes. Pt understanding that today is her last visit.  Pt accompanied by: self   PERTINENT HISTORY: PD, CVA, Depression, arthritis, GERD, CA, CHF, HLD, HTN  PAIN:  Are you having pain? No  PRECAUTIONS: Fall and Other: R hemi  PATIENT GOALS: "I want to walk again"   OBJECTIVE:   DIAGNOSTIC FINDINGS: 01/13/23 MRI Brain IMPRESSION: 1. Suspected 5 mm early subacute infarct in the right precentral gyrus. 2. Severe chronic small vessel ischemic disease. New small chronic cortical infarcts in the left frontal and parietal lobes. 3. Progressive sinusitis.   TODAY'S TREATMENT:       NMR SciFit manual level 1 for 6 minutes using BUE/BLEs for neural priming for reciprocal movement, dynamic cardiovascular warmup and increased amplitude of stepping. Min cues for increased step length w/RLE throughout. RPE of 3-4/10 following activity.   GAIT TRAINING  In // bars w/three therapists for improved BLE strength, transfers, pressure relief and improved ROM:  Pt performs sit to stands x2 w/mod multimodal cues to facilitate anterior weight shift and min-mod Ax2.  Pt ambulated 6' x2 w/BUE support and mod A x3 for lateral weight shifting, retropulsion correction, bilateral knee block, sequencing and proper hand placement. Pt exhibits improved ability to assist with anteriolateral weight shift during gait as session progresses for improved ability to  advance limbs during gait.  Sit to stand to RW with max A x 2. Pt only able to take a few steps forwards with RW but remains in very crouched position with B knees flexed and trunk flexed, unable to achieve upright posture. Pt returned to sitting then able to stand again, unable to take steps during 2nd bout of standing due to retropulsion and increased fatigue.   PATIENT EDUCATION: Education details: continue HEP, PT POC with plan to d/c this session due to [facility] request Person educated: Patient Education method: Explanation Education comprehension: verbalized understanding  HOME EXERCISE PROGRAM: Access Code: [email or web address] URL: [email or web address] Date: 04/26/2023 Prepared by: Merry Lofty  Exercises - Supine Gluteal Sets  - 1 x daily - 7 x weekly - 3 sets - 10 reps - Supine Hip Abduction  - 1 x daily - 7 x weekly - 3 sets - 10 reps - Supine Heel Slide  - 1 x daily - 7 x weekly - 3 sets - 10 reps - Supine Ankle Pumps  - 1 x daily - 7 x weekly - 3 sets - 10 reps - Seated Scapular Retraction  - 1 x daily - 7 x weekly - 3 sets - 10 reps - Supine Hamstring Stretch with Caregiver  - 1 x daily - 7 x weekly - 3 sets - 30s hold - Seated Hamstring Stretch with Chair  - 1 x daily - 7 x weekly - 3 sets - 30s hold  GOALS: Goals reviewed with patient? Yes  SHORT TERM GOALS: Target date: 05/05/23  Pt will be independent with initial HEP for improved functional strength Baseline: to be provided Goal status: MET  2.  Patient will complete sit <> stand with LRAD and MaxA x1  Baseline: Total A, max A to total A in // bars Goal status: IN PROGRESS  3.  Patient will initiate gait training Baseline: non-ambulatory; initiated 9/18  Goal status: MET  4.  Patient will maintain static sitting balance at EOB for at least 30s with no more than ModA Baseline: to be assessed; CGA-min A (9/13) Goal status: MET   LONG TERM  GOALS: Target date: 06/21/23 (updated to match last scheduled  appointment within cert)  Pt will be independent with final HEP for improved functional strength Baseline: to be provided Goal status: MET  Patient will complete sit <> stand with LRAD and Mod x1  Baseline: Total A, max A (11/1), max A (11/6) Goal status: NOT MET  Patient will ambulate at least 54ft in // bars with no more than ModA + wc follow as necessary Baseline: MaxA x33ft + wc follow, max A x 10 ft + w/c follow (11/6) Goal status: NOT MET   Patient will maintain static sitting balance at EOB for at least 60s with no more than MinA Baseline: to be assessed; CGA-min A (9/13), close Supervision (11/6) Goal status: MET  ASSESSMENT:  CLINICAL IMPRESSION: Emphasis of skilled PT session on priming BLE on SciFit followed by gait training in // bars and w/RW. Pt able to teach back proper sit to stand sequence this date w/improved anterior weight shift noted. Pt continues to be limited by bilateral hip and knee flexion contractures as well as R ankle inversion contracture that has continued to worsen during POC due to ill-fitting WC and improper positioning. Pt demonstrated increased step length/clearance this date w/verbal cues for hip abduction and tactile cues applied to pelvis for lateral weight shifting. Pt would benefit from continued skilled PT services to continue to work towards increased safety and independence with functional mobility. However, patient is a member of [facility] and [facility] has elected to no longer cover skilled PT services in this setting despite benefits to patient.    OBJECTIVE IMPAIRMENTS: cardiopulmonary status limiting activity, decreased activity tolerance, decreased balance, decreased coordination, decreased endurance, decreased knowledge of condition, decreased knowledge of use of DME, decreased mobility, difficulty walking, decreased ROM, decreased strength, impaired perceived functional ability, impaired sensation, impaired tone, improper body mechanics, and postural  dysfunction.   ACTIVITY LIMITATIONS: carrying, lifting, bending, sitting, standing, squatting, stairs, transfers, bed mobility, continence, bathing, toileting, dressing, self feeding, reach over head, hygiene/grooming, and locomotion level  PARTICIPATION LIMITATIONS: meal prep, cleaning, laundry, medication management, interpersonal relationship, shopping, and community activity  PERSONAL FACTORS: Age, Past/current experiences, Time since onset of injury/illness/exacerbation, Transportation, and 3+ comorbidities: see above  are also affecting patient's functional outcome.   REHAB POTENTIAL: Fair time since onset  CLINICAL DECISION MAKING: Evolving/moderate complexity  EVALUATION COMPLEXITY: Moderate    Peter Congo, PT, DPT, [facility] [address] [place], [place]  [address] Phone:  [phone] Fax:  [phone]  06/21/2023, 3:29 PM

## 2023-06-21 NOTE — Therapy (Signed)
OUTPATIENT SPEECH LANGUAGE PATHOLOGY [facility] T[facility]ATMENT & DISCHARGE SUMMARY   Patient Name: Selena Bush MRN: [id number] DOB:July 19, 1946, 77 y.o., female Today's Date: 06/21/2023  PCP: Jethro Bastos, MD REFERRING PROVIDER: Alver Sorrow, NP  END OF SESSION:  End of Session - 06/21/23 1401     Visit Number 4    Number of Visits 25    Date for SLP Re-Evaluation 07/26/23    SLP Start Time 1400    SLP Stop Time  1445    SLP Time Calculation (min) 45 min    Activity Tolerance Patient tolerated treatment well              Past Medical History:  Diagnosis Date   Anxiety    Atrial fibrillation (HCC)    Cat bite of right hand 03/25/2019   Chronic diastolic CHF (congestive heart failure) (HCC)    a. echo 09/2014: EF 60-65%, diastolic dysfunction, mild LVH, nl RV size & systolic function, mildly dilated LA (4.3 cm), mild MR/TR, mildly elevated PASP 36.7 mm Hg   DDD (degenerative disc disease), cervical    DDD (degenerative disc disease), lumbar    Depression    Diffuse cystic mastopathy 2014   Dyspnea    Dysrhythmia    GERD (gastroesophageal reflux disease)    Gout    Headache    rare   HLD (hyperlipidemia)    a. statin intolerant 2/2 myalgias   Hx of dysplastic nevus 12/25/2018   L medial ankle   Hypercholesterolemia    Hypertension    Malignant neoplasm of upper-outer quadrant of female breast (HCC) 10/2012   Papillary DCIS, sentinel node negative. DR/PR positive. PARTIAL RIGHT MASTECTOMY FOR BREAST CANCER--HAD RADIATION - NO CHEMO --DR. CRYSTAL [place] ONCOLOGIST   Obesity    OSA on CPAP    Osteoarthritis of both knees    a. s/p right TKA 04/2013 & left TKA 09/2014   Otitis externa    Parkinson disease [facility])    Personal history of radiation therapy 2015   RIGHT breast-mammosite per pt   Pneumonia of both lungs due to infectious organism 01/08/2021   Sleep difficulties    LUNESTA HAS HELPED   Vaginal cyst    Past Surgical History:  Procedure  Laterality Date   ABDOMINAL HYSTERECTOMY  1992   DUB; fibroids; endometriosis.  One remaining ovary.     BREAST LUMPECTOMY Right 2015   Papillary DCIS, sentinel node negative. DR/PR positive. PARTIAL RIGHT MASTECTOMY FOR BREAST CANCER--HAD RADIATION - NO CHEMO --DR. CRYSTAL [place] ONCOLOGIST   BREAST SURGERY Right 10/2012   Wide excision,APB RT 10 mm papillary DCIS, ER/PR positive. Sentinel node negative. Partial breast radiation.   CATARACT EXTRACTION, BILATERAL  02/13/2016   [facility].   CHOLECYSTECTOMY  1994   COLONOSCOPY  2015   1 benign polyp-every 5 years/ Dr Bluford Kaufmann   COLONOSCOPY WITH PROPOFOL N/A 02/10/2021   Procedure: COLONOSCOPY WITH PROPOFOL;  Surgeon: Earline Mayotte, MD;  Location: [facility] ENDOSCOPY;  Service: Endoscopy;  Laterality: N/A;   ERCP  1995   JOINT REPLACEMENT Right 04/2013   knee   PERICARDIOCENTESIS N/A 11/13/2017   Procedure: PERICARDIOCENTESIS;  Surgeon: Yvonne Kendall, MD;  Location: [facility] INVASIVE CV LAB;  Service: Cardiovascular;  Laterality: N/A;   TOTAL KNEE ARTHROPLASTY Right 04/16/2013   Procedure: RIGHT TOTAL KNEE ARTHROPLASTY;  Surgeon: Shelda Pal, MD;  Location: [facility] ORS;  Service: Orthopedics;  Laterality: Right;   TOTAL KNEE ARTHROPLASTY Left 09/15/2014   Procedure: LEFT TOTAL KNEE ARTHROPLASTY;  Surgeon: Shelda Pal, MD;  Location: [facility] ORS;  Service: Orthopedics;  Laterality: Left;   TUBAL LIGATION  1979   UPPER GI ENDOSCOPY     Patient Active Problem List   Diagnosis Date Noted   Cerebral vascular disease 10/10/2022   Gait abnormality 07/11/2022   Pressure injury of skin 06/05/2022   Rib fracture 06/04/2022   Stage 3a chronic kidney disease (CKD) (HCC) 06/04/2022   Skin ulcer with fat layer exposed (HCC) 03/16/2022   Vitamin B 12 deficiency 01/27/2022   Malaise 01/27/2022   Fever and chills 01/27/2022   Wheezing 01/27/2022   Vitamin D deficiency 01/27/2022   Hallucinations 11/24/2021   Abdominal pain 11/12/2021   Falls frequently  03/24/2021   Difficulty walking 04/04/2020   Hypophonia 01/15/2020   Numbness 12/28/2019   Left arm weakness 12/09/2019   Tremor 08/26/2019   Parkinsonism (HCC) 07/29/2019   DDD (degenerative disc disease), lumbar 10/19/2018   Preventative health care 10/19/2018   Prediabetes 10/19/2018   (HFpEF) heart failure with preserved ejection fraction (HCC) 12/12/2017   Pericardial effusion with cardiac tamponade 11/13/2017   Paroxysmal atrial fibrillation (HCC) 11/05/2017   Acute cough 05/30/2017   Anxiety 03/21/2016   Insomnia 07/17/2015   MI (mitral incompetence) 12/10/2014   Major depressive disorder 12/09/2014   OSA (obstructive sleep apnea) 12/09/2014   GERD (gastroesophageal reflux disease) 12/09/2014   Chronic diastolic CHF (congestive heart failure) (HCC)    HLD (hyperlipidemia)    Osteoarthritis of both knees    S/P left TKA 08/19/2014   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 Referral to Dr. Arbutus Leas after Right LE weakness, fall, CVA revealed weak voice. Apparently she was ambulatory prior to CVA vs fall Feb 2024. She reports vague h/o of ST, however she does not recall what was worked on. At this time, she is dependent feeder and endorses coughing/choking with meals "a few times a week" Per Dr. Arbutus Leas consult: Unfortunately, the patient fell in February, 2024 (pt states had her walker and  just fell to the side, she thinks) and declined significantly after that.  Patient states that she had her walker with her and fell to her side and had to lay on the side for over 30 minutes before EMS came.  Her right leg would not work per patient.  [facility] to [facility] that day and her daughter states that they x-rayed the ankle because the right leg was not working. [facility] noted that patient could not walk, and previously had been able to and subsequently sent her to SN[facility].  She was not evaluated at the hospital.  She had an appt 4 days later with Dr. Terrace Arabia.  Daughter states that they reviewed the MRI done prior to the fall and did a carotid u/s.  No imaging was ordered after the fall.   She could stand when she left SNF but she couldn't walk but she was able  to raise the R leg after she worked for a month and rehab.  However, she can no longer stand now that she is home and seems to have backtracked.     PAIN:  Are you having pain? No  FALLS: Has patient fallen in last 6 months?  Yes, See PT evaluation for details  LIVING ENVIRONMENT: Lives with:  Personal care attendant, [name] Lives in: House/apartment  PLOF:  Level of assistance: Needed assistance with ADLs, Needed assistance with IADLS Employment: Retired  PATIENT GOALS: "To get to speak again the way I used to"  OBJECTIVE:   TODAY'S TREATMENT:                                                                                                                                          06/21/23: Mearl reports completing Lesson 2 at home. Instructed that she should do each lesson twice, then move on the the next lesson. Provided an exercise log to keep track lessons.  Targeted volume and intelligibility using Speak Out! Lesson 3. Pt required occassional verbal cues, modeling, for volume and breath support. Pt averages the following volume levels:  Sustained AH: 74dB  Counting:76  Reading (phrases): 78dB (74-80)  Cognitive Exercise:  70dB Required occasional  verbal cues, modeling for carryover of intent /volume reading abbreviations and saying what they stood for to average 70dB. In conversation re: her family, Alayiah required usual mod verbal and gesture cues to maintain 68-72dB.   0-23-24: Addressed dysarthria through Speak Out lesson 2. SLP led pt through exercises, provided consistent model prior to pt execution. Occasional mod A required to achieve target dB. Averaged 80 dB in loud "ah", 72 dB in glides, 78 dB in counting, 72 dB in reading, and 71 dB in cognitive tasks given cues. SLP ordered pt's Speak Out workbook. Session in part limited by patient focused on scheduling questions/concerns requiring care coordination with therapy team. Maintained low 60s dB during unstructured conversation, with verbal cues provided to increase volume. Required SLP A to bring cup and straw to mouth, no overt s/sx of aspiration with sequential straw sips.   05-17-23: Administered Communicative Effectiveness Survey (score 26).  - Rated a 2: being part of a noisy environment.  - Rated a 3: Participating in conversation with strangers in a quiet place, conversing with a stranger over the telephone, having a conversation while traveling in a car, and having a conversation with someone at a distance.   Targeted improving vocal quality and increasing intensity through Speak Out! Program lesson 1. SLP lead pt through exercises providing usual model prior to pt execution. Usual mod A required to achieve target dB this date. SLP developed modified Speak Out! Lesson to practice as daily HEP (see pt instructions) because pt does not have work book yet.  She did not bring email address today, so work book was not ordered, but pt was instructed  to bring it next time. With prompting, pt generated 10 functional phrases to include in HEP. Averages this date:  Sustained "ah" 84 dB Glides 79 dB Counting 80 dB  Endorsed some occasional difficulty swallowing  thin liquids. Reported self-regulating with open cup at home but unable to bring cup/straw to mouth independently over last 2 sessions. 1:1 assistance provided for all sips. Consecutive sips consumed with no overt s/sx of aspiration exhibited. Feeding difficulty appeared most related to positioning, in which SLP alerted OT of observations.    05/03/23 (eval day): Introduced [employer]! Program, work book and website. Demonstrated website. Completed modified Lesson one (due to time) - Derika averaged 78dB on warm up; 80dB on loud Ah, 70dB on glides, 70dB on counting. She does not recall her email, but she will bring it with her phone next session.   PATIENT EDUCATION: Education details: Speak Out! Exercise program, compensations for cognition, xerostomia ed, See Today's Treatment, See patient instructions Person educated: Patient Education method: Explanation, Demonstration, Verbal cues, and Handouts Education comprehension: returned demonstration, verbal cues required, and needs further education  HOME EXERCISE PROGRAM: Speak Out! Need to order workbook next session when she brings her email  SPEECH THERAPY DISCHARGE SUMMARY  Visits from Start of Care: 4  Current functional level related to goals / functional outcomes: See goals below   Remaining deficits: Moderate to severe hypokinetic dysarthria   Education / Equipment: HEP for dysarthria; compensations for dysarthria   Patient agrees to discharge. Patient goals were not met. Patient is being discharged due to the patient's request. She will continue ST at her day program.     GOALS: Goals reviewed with patient? Yes  SHORT TERM GOALS: Target date: 05/31/23 (1 ST visit by STG date)  Complete Communicative Effectiveness Survey first 1-2 visits Baseline: Goal status: MET  2.  Pt will hit target dB on HEP with usual mod A         Baseline:  Goal status: NOT MET  3.  Pt will average 70dB 18/20 sentences with occasional min  A Baseline:  Goal status: MET   4.  Pt will complete HEP twice a day consistently with occasional min A Baseline:  Goal status: NOT MET   5.  Pt will average 68 dB over 5 minute simple conversation with occasional min A Baseline:  Goal status: NOT MET  6.  Complete Bedside swallow eval and MBSS if indicated Baseline:  Goal status: PARTIALLY MET   LONG TERM GOALS: Target date: 07/26/23  Pt will complete HEP for dysarthria (Speak Out!) twice a day with rare min A Baseline:  Goal status: NOT MET  2.  Pt will average 70dB over 15 minute conversation with rare min A Baseline:  Goal status: INTO MET  3.  Pt will improve score on Communication Effectiveness Survey by 3 points Baseline:  Goal status: NOT MET  4.  Pt will follow swallow precaution and diet modifications pending MBSS if indicated Baseline:  Goal status: DEFERRED  5.  Pt will access 2 contacts on her cell phone and emails with usual mod A  Baseline:  Goal status: NOT MET  ASSESSMENT:  CLINICAL IMPRESSION: Patient is a 77 y.o. female who was seen today for ST tx related to PD and CVA. Conducted ongoing education and instruction of dysarthria strategies and Speak Out! program. Continues to require occasional to usual cues to optimize volume during structured tasks and in conversation. No overt s/sx of aspiration exhibited with thin liquids trials; would  benefit from re-education of using intent to optimize swallow function. Pt is requesting d/c from ST and plans to continue ST at her day program.  OBJECTIVE IMPAIRMENTS: Objective impairments include memory, awareness, dysarthria, and dysphagia. These impairments are limiting patient from effectively communicating at home and in community.Factors affecting potential to achieve goals and functional outcome are medical prognosis and severity of impairments.. Patient will benefit from skilled SLP services to address above impairments and improve overall function.  REHAB  POTENTIAL: Good  PLAN:  SLP FREQUENCY: 2x/week  SLP DURATION: 12 weeks  PLANNED INTERVENTIONS: Aspiration precaution training, Pharyngeal strengthening exercises, Diet toleration management , Language facilitation, Environmental controls, Trials of upgraded texture/liquids, Cueing hierachy, Cognitive reorganization, Internal/external aids, Functional tasks, Multimodal communication approach, SLP instruction and feedback, and Compensatory strategies, MBSS    Jenafer Winterton, Radene Journey, CCC-SLP 06/21/2023, 3:48 PM

## 2023-06-21 NOTE — Therapy (Signed)
OUTPATIENT OCCUPATIONAL THERAPY NEURO TREATMENT & DISCHARGE NOTE  Patient Name: Selena Bush MRN: [id number] DOB:1946/01/02, 77 y.o., female Today's Date: 06/21/2023  PCP: Jethro Bastos, MD  REFERRING PROVIDER: Alver Sorrow, NP  END OF SESSION:  OT End of Session - 06/21/23 1324     Visit Number 5    Number of Visits 17    Date for OT Re-Evaluation 06/28/23    Authorization Type [employer]    OT Start Time 1320    OT Stop Time 1400    OT Time Calculation (min) 40 min    Equipment Utilized During Treatment Personal WC    Activity Tolerance Patient tolerated treatment well    Behavior During Therapy WFL for tasks assessed/performed             Past Medical History:  Diagnosis Date   Anxiety    Atrial fibrillation (HCC)    Cat bite of right hand 03/25/2019   Chronic diastolic CHF (congestive heart failure) (HCC)    a. echo 09/2014: EF 60-65%, diastolic dysfunction, mild LVH, nl RV size & systolic function, mildly dilated LA (4.3 cm), mild MR/TR, mildly elevated PASP 36.7 mm Hg   DDD (degenerative disc disease), cervical    DDD (degenerative disc disease), lumbar    Depression    Diffuse cystic mastopathy 2014   Dyspnea    Dysrhythmia    GERD (gastroesophageal reflux disease)    Gout    Headache    rare   HLD (hyperlipidemia)    a. statin intolerant 2/2 myalgias   Hx of dysplastic nevus 12/25/2018   L medial ankle   Hypercholesterolemia    Hypertension    Malignant neoplasm of upper-outer quadrant of female breast (HCC) 10/2012   Papillary DCIS, sentinel node negative. DR/PR positive. PARTIAL RIGHT MASTECTOMY FOR BREAST CANCER--HAD RADIATION - NO CHEMO --DR. CRYSTAL Bison ONCOLOGIST   Obesity    OSA on CPAP    Osteoarthritis of both knees    a. s/p right TKA 04/2013 & left TKA 09/2014   Otitis externa    Parkinson disease [facility])    Personal history of radiation therapy 2015   RIGHT breast-mammosite per pt   Pneumonia of both lungs due to  infectious organism 01/08/2021   Sleep difficulties    LUNESTA HAS HELPED   Vaginal cyst    Past Surgical History:  Procedure Laterality Date   ABDOMINAL HYSTERECTOMY  1992   DUB; fibroids; endometriosis.  One remaining ovary.     BREAST LUMPECTOMY Right 2015   Papillary DCIS, sentinel node negative. DR/PR positive. PARTIAL RIGHT MASTECTOMY FOR BREAST CANCER--HAD RADIATION - NO CHEMO --DR. CRYSTAL [place] ONCOLOGIST   BREAST SURGERY Right 10/2012   Wide excision,APB RT 10 mm papillary DCIS, ER/PR positive. Sentinel node negative. Partial breast radiation.   CATARACT EXTRACTION, BILATERAL  02/13/2016   [facility].   CHOLECYSTECTOMY  1994   COLONOSCOPY  2015   1 benign polyp-every 5 years/ Dr Bluford Kaufmann   COLONOSCOPY WITH PROPOFOL N/A 02/10/2021   Procedure: COLONOSCOPY WITH PROPOFOL;  Surgeon: Earline Mayotte, MD;  Location: [facility] ENDOSCOPY;  Service: Endoscopy;  Laterality: N/A;   ERCP  1995   JOINT REPLACEMENT Right 04/2013   knee   PERICARDIOCENTESIS N/A 11/13/2017   Procedure: PERICARDIOCENTESIS;  Surgeon: Yvonne Kendall, MD;  Location: [facility] INVASIVE CV LAB;  Service: Cardiovascular;  Laterality: N/A;   TOTAL KNEE ARTHROPLASTY Right 04/16/2013   Procedure: RIGHT TOTAL KNEE ARTHROPLASTY;  Surgeon: Shelda Pal, MD;  Location: [facility] ORS;  Service: Orthopedics;  Laterality: Right;   TOTAL KNEE ARTHROPLASTY Left 09/15/2014   Procedure: LEFT TOTAL KNEE ARTHROPLASTY;  Surgeon: Shelda Pal, MD;  Location: [facility] ORS;  Service: Orthopedics;  Laterality: Left;   TUBAL LIGATION  1979   UPPER GI ENDOSCOPY     Patient Active Problem List   Diagnosis Date Noted   Cerebral vascular disease 10/10/2022   Gait abnormality 07/11/2022   Pressure injury of skin 06/05/2022   Rib fracture 06/04/2022   Stage 3a chronic kidney disease (CKD) (HCC) 06/04/2022   Skin ulcer with fat layer exposed (HCC) 03/16/2022   Vitamin B 12 deficiency 01/27/2022   Malaise 01/27/2022   Fever and chills 01/27/2022    Wheezing 01/27/2022   Vitamin D deficiency 01/27/2022   Hallucinations 11/24/2021   Abdominal pain 11/12/2021   Falls frequently 03/24/2021   Difficulty walking 04/04/2020   Hypophonia 01/15/2020   Numbness 12/28/2019   Left arm weakness 12/09/2019   Tremor 08/26/2019   Parkinsonism (HCC) 07/29/2019   DDD (degenerative disc disease), lumbar 10/19/2018   Preventative health care 10/19/2018   Prediabetes 10/19/2018   (HFpEF) heart failure with preserved ejection fraction (HCC) 12/12/2017   Pericardial effusion with cardiac tamponade 11/13/2017   Paroxysmal atrial fibrillation (HCC) 11/05/2017   Acute cough 05/30/2017   Anxiety 03/21/2016   Insomnia 07/17/2015   MI (mitral incompetence) 12/10/2014   Major depressive disorder 12/09/2014   OSA (obstructive sleep apnea) 12/09/2014   GERD (gastroesophageal reflux disease) 12/09/2014   Chronic diastolic CHF (congestive heart failure) (HCC)    HLD (hyperlipidemia)    Osteoarthritis of both knees    S/P left TKA 08/19/2014   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 (BMI) 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 (HCC) 04/17/2013   History of ductal carcinoma in situ (DCIS) of breast 11/05/2012   Essential hypertension, benign 11/05/2012   ONSET DATE: 04/06/2023 (date of referral)  REFERRING DIAG: Z86.73 (ICD-10-CM) - History of CVA  THERAPY DIAG:  Muscle weakness (generalized)  Abnormal posture  Other lack of coordination  Rationale for Evaluation and Treatment: Rehabilitation  SUBJECTIVE:   SUBJECTIVE STATEMENT: Pt in the lobby alone upon OT arrival and she declined water upon arrival today.  She is aware that today is her last day of outpt OT services.    Pt accompanied by: self  PERTINENT HISTORY: PD, CVA, Depression, arthritis, GERD, CA, CHF, HLD, HTN   PRECAUTIONS: Fall  WEIGHT BEARING RESTRICTIONS: No  PAIN:  Are you having pain? No  FALLS: Has patient fallen in last 6 months? Yes. Number of falls fell from her  stroke  LIVING ENVIRONMENT: Lives with:  caregiver Lives in: House/apartment Stairs:  ramped entrance Has following equipment at home: Single point cane, Walker - 2 wheeled, Environmental consultant - 4 wheeled, Wheelchair (manual), shower chair, bed side commode, Ramped entry, and [employer] lift   PLOF: Needs assistance with ADLs, Needs assistance with homemaking, Needs assistance with gait, and Needs assistance with transfers  PATIENT GOALS: improve use of BUEs  OBJECTIVE: (from evaluation unless otherwise noted)  05/17/2023: Multiple skin tears to RUE; 2 bandaged -1 proximal forearm and 1 on dorsal R hand; mid forearm tear in outline of rosary bracelet; dried blood on rolled up sleeve Cyanotic skin of L hand, which is cool to the touch, no improvement to color with gentle massage or positioning; pt has active movement; denies pain Feet sliding off foot plates of wheelchair, repositioned flat on floor with indention on top of  foot from where tongue of shoe rested against dorsiflexed foot; pt does not have socks donned; signs of skin break down on R foot though blanches L lower leg has hematoma laterally with erythema surrounding approximately 10-12" in length extending from just below knee to top of shoe and approximately 4-5" wide  HAND DOMINANCE: Right  ADLs: Overall ADLs: dependent  IADLs: Overall IADLs: dependent  MOBILITY STATUS:  wheelchair bound  POSTURE COMMENTS:  rounded shoulders, forward head, increased thoracic kyphosis, right pelvic obliquity, flexed trunk , and weight shift right   ACTIVITY TOLERANCE: Activity tolerance: fair  FUNCTIONAL OUTCOME MEASURES: PSFS: 0   Total score = sum of the activity scores/number of activities Minimum detectable change (90%CI) for average score = 2 points Minimum detectable change (90%CI) for single activity score = 3 points   UPPER EXTREMITY ROM:     AROM Right (eval) Left (eval)  Shoulder flexion 80 45  Shoulder abduction 45 45  Elbow  flexion WNL WFL  Elbow extension -40 -30  Wrist flexion 60 62  Wrist extension 30 38  Wrist pronation WNL WNL  Wrist supination To neutral 10  Digit Composite Flexion Lacks 2 cm 2nd digit Lacks 1 cm 2nd digit  Digit Composite Extension WFL WNL  Digit Opposition WFL WFL  (Blank rows = not tested)  UPPER EXTREMITY MMT:     MMT Right (eval) Left (eval)  Shoulder flexion BFL BFL  Shoulder abduction BFL BFL  Elbow flexion [facility] WFL  Elbow extension WFL WFL  (Blank rows = not tested) Lacks endurance with testing.   HAND FUNCTION: Grip strength: Right: 21.8 lbs; Left: 25.5 lbs  COORDINATION: Box and Blocks:  Right 3blocks, Left 5blocks  SENSATION: WFL  EDEMA: none reported or observed  MUSCLE TONE: RUE: Moderate and Rigidity and LUE: Moderate and Rigidity  COGNITION: Overall cognitive status: Within functional limits for tasks assessed  VISION: To be assessed during future visit  PERCEPTION: Not tested  PRAXIS: Not tested  OBSERVATIONS: Pt requires assistance to propel wheelchair. Poor positioning within wheelchair pushing BUE into shoulder protraction, elbow flexion, shoulder adduction, and wrist flexion. Unable to look pt directly in the eyes given kyphotic and forward head posturing. Appears well kept. Glasses donned. Pt able to hold small sandwich in her R hand upon leaving clinic.   TODAY'S TREATMENT:                                                                                                                               Therapeutic Activities:  OT assisted with improved positioning in WC with adjustment her hips and lateral supports - to promote increased upright posture and decrease leaning in WC.  Reviewed repositioning and pressure relief at home via power recliner chair for skin protection.   Discharge Instruction Ideas reviewed, listed and printed for pt with simple list made of recommendations including:  Range of Motion Exercises:  -Table top slides  (per instructions last week  ie) UE reaching for shoulder and elbow ROM with different back and forth/side to side motions on table top in all directions for shoulder and elbow ROM). -Elbow bend and straighten -Fingers open and close -Lifting shoulders  -Neck ROM - pick up head, turn your head back and forth  FUNCTIONAL TASKS -Wash hands and face -Apply lotion & makeup -Hold food, cup, utensils (with big handles) *Fold washcloths - practiced during session today with slide board across lap for table and info provided re: small lap/breakfast table for her consideration of purchase for home use *Match socks - also practiced today ie) reaching to get matching socks from different spots on makeshift table top in [facility]  Therapy Activities *Pick up Blocks, pegs, beads and put them in containers - also completed this task again today with pt able to move blocks from table top to bowl x10 blocks with each hand. -Plastic Canvas - ideas suggested as pt used to do need point but the large needle and holes in the canvas would be easier to manage  PATIENT EDUCATION: Education details: Discharge instructions Person educated: Patient Education method: Explanation, Demonstration, Tactile cues, Verbal cues, and Handouts Education comprehension: verbalized understanding, returned demonstration, tactile cues required, and needs further education  HOME EXERCISE PROGRAM: 06/07/23 - Table top UE ROM Access Code: [email or web address] 06/21/23 - See DC instructions in pt instructions  GOALS:  SHORT TERM GOALS: Target date: 05/31/2023   Patient will demonstrate independence with initial BUE ROM HEP. Baseline: not yet initiated Goal status: MET  2.  Pt will be able to place at least 8 blocks using right hand with completion of Box and Blocks test.  Baseline: Right 3 blocks Goal status: MET with modifications  3.  Pt will be able to place at least 10 blocks using left hand with completion of Box and Blocks  test. Baseline: Left 5 blocks Goal status: MET with modifications  4.  Pt will independently recall at least 3 positioning and pressure relief strategies as needed to prevent pressure injuries and contractures.  Baseline:  Goal status: MET  5.  Pt will demonstrate full composite flexion bilaterally as needed to pick up and hold ADL items such as hair brush, tooth brush, eating utensils, etc.  Baseline:  Right  Left Lacks 2 cm 2nd digit Lacks 1 cm 2nd digit  Goal status: MET with modifications  LONG TERM GOALS: Target date: 06/28/2023  Patient will demonstrate updated RUE and LUE HEP with 25% verbal cues or less for proper execution. Baseline: not yet initiated Goal status: MET  2.  Pt will verbalize understanding of ways to prevent future PD related complications. Baseline: not yet initiated Goal status: MET - with Caregiver assistance  3.  Pt will be able to place at least 12 blocks using right hand with completion of Box and Blocks test.  Baseline: Right 3 blocks Goal status: Not MET  4.  Pt will be able to place at least 14 blocks using left hand with completion of Box and Blocks test. Baseline: Left 5 blocks Goal status: Not MET  5.  Patient will report at least two-point increase in average PSFS score or at least three-point increase in a single activity score indicating functionally significant improvement given minimum detectable change. Baseline: 0 total score (See above for individual activity scores)  Goal status: Not MET  ASSESSMENT:  CLINICAL IMPRESSION: Patient is a 77 y.o. female who was seen today for occupational therapy treatment for positioning needs and UE functional use.  Patient benefits from adjustments for WC positioning for increased comfort and alignment.  OT used slide board for [facility] tray to provided UE support to make it eaiser completed table top activities but pt still limited with elevated reaching.  Pt will benefit from continued caregiver assistance  in community setting for ongoing encouragement to maximize functional UE use for ADLs ie) self feeding  and other functional UE ROM tasks.  PERFORMANCE DEFICITS: in functional skills including ADLs, IADLs, coordination, tone, ROM, strength, Fine motor control, Gross motor control, mobility, body mechanics, decreased knowledge of use of DME, skin integrity, and UE functional use.   IMPAIRMENTS: are limiting patient from ADLs, IADLs, leisure, and social participation.   CO-MORBIDITIES: may have co-morbidities  that affects occupational performance. Patient will benefit from skilled OT to address above impairments and improve overall function.  REHAB POTENTIAL: Fair given chronicity of poor posturing   PLAN:  OCCUPATIONAL THERAPY DISCHARGE SUMMARY  Visits from Start of Care: 5  Current functional level related to goals / functional outcomes: Pt has progressed with goals meeting 5/5 short term goals with modifications and 2/5 long term goals to satisfactory levels and is pleased with outcomes.  Remaining deficits: Pt has ongoing significant functional deficits in AROM, strength and coordination of BUEs due to Parkinson's and has constant caregivers for assistance with ADLs, transfer and functional mobility at this time.   Education / Equipment: Pt has all needed materials and education. Pt and caregivers understands\ how to continue on with self-management. See tx notes for more details.   Patient agrees to discharge due to payor source limitations at this time.   Victorino Sparrow, OT 06/21/2023, 4:48 PM

## 2023-06-21 NOTE — Patient Instructions (Signed)
HEP Ideas  Range of Motion Exercises:  Table top slides Elbow bend and straighten Fingers open and close Lifting shoulders  Neck ROM - pick up head, turn your head back and forth  FUNCTIONAL TASKS Wash hands and face Apply lotion & makeup Hold food, cup, utensils (with big handles) Fold washcloths Match socks  Therapy Activities Pick up Blocks, pegs, beads and put them in containers Plastic Canvas

## 2023-06-23 ENCOUNTER — Ambulatory Visit (INDEPENDENT_AMBULATORY_CARE_PROVIDER_SITE_OTHER): Payer: Medicare (Managed Care) | Admitting: Podiatry

## 2023-06-23 ENCOUNTER — Encounter: Payer: Self-pay | Admitting: Podiatry

## 2023-06-23 DIAGNOSIS — M79674 Pain in right toe(s): Secondary | ICD-10-CM | POA: Diagnosis not present

## 2023-06-23 DIAGNOSIS — I739 Peripheral vascular disease, unspecified: Secondary | ICD-10-CM

## 2023-06-23 DIAGNOSIS — M79675 Pain in left toe(s): Secondary | ICD-10-CM

## 2023-06-23 DIAGNOSIS — B351 Tinea unguium: Secondary | ICD-10-CM

## 2023-06-23 NOTE — Progress Notes (Signed)
  Subjective:  Patient ID: Selena Bush, female    DOB: Sep 22, 1945,  MRN: [id number]  Chief Complaint  Patient presents with   Debridement    Trim toenails/hallux left appears to have been bleeding    77 y.o. female presents with the above complaint. History confirmed with patient. Patient presenting with pain related to dystrophic thickened elongated nails. Patient is unable to trim own nails related to nail dystrophy and/or mobility issues. Patient does not have a history of T2DM.    Objective:  Physical Exam: warm, good capillary refill nail exam onychomycosis of the toenails, onycholysis, and dystrophic nails protective sensation intact decreased pedal pulses bilaterally large Left Foot:  Pain with palpation of nails due to elongation and dystrophic growth.  Right Foot: Pain with palpation of nails due to elongation and dystrophic growth.   Assessment:   1. Pain due to onychomycosis of toenails of both feet   2. PVD (peripheral vascular disease) (HCC)      Plan:  Patient was evaluated and treated and all questions answered.  #Onychomycosis with pain  -Nails palliatively debrided as below. -Educated on self-care  Procedure: Nail Debridement Rationale: Pain Type of Debridement: manual, sharp debridement. Instrumentation: Nail nipper, rotary burr. Number of Nails: 10  Return in about 3 months (around 09/23/2023) for RFC.         Corinna Gab, DPM [facility] / [facility]

## 2023-07-18 NOTE — Progress Notes (Signed)
HPI: Follow-up diastolic congestive heart failure, paroxysmal atrial fibrillation, pericardial effusion, hypertension and hyperlipidemia. Patient was admitted March 2019 with atypical chest pain and paroxysmal atrial fibrillation. She was treated with sotalol and Xarelto. However she returned with significant dyspnea and follow-up echocardiogram showed large pericardial effusion with tamponade physiology.  She had pericardiocentesis on April 1. Cytology was negative.  At that time we felt it was likely her initial presentation was pericarditis and she had a hemorrhagic pericardial effusion from initiation of anticoagulation.  We felt her atrial fibrillation may improve once pericardial process resolved. Carotid Dopplers April 2024 showed 1 to 39% bilateral stenosis.  Brain MRI May 2024 showed subacute infarct in the right precentral gyrus.  Monitor July 2024 showed sinus rhythm with short episodes of paroxysmal atrial fibrillation.  Echocardiogram August 2024 showed normal LV function, mild left ventricular hypertrophy, grade 2 diastolic dysfunction, mild right ventricular enlargement, mild left atrial enlargement.  Given atrial fibrillation and CVA patient was initiated on anticoagulation.  Patient seen in the emergency room December 13 with episode of altered mentation felt secondary to hypotension.  Since last seen patient apparently has lost significant amounts of weight as she has had decreased p.o. intake.  She has also developed difficulties with hypotension associated with altered mental status.  She denies dyspnea, chest pain.  Occasional minimal pedal edema.  Current Outpatient Medications  Medication Sig Dispense Refill   acetaminophen (TYLENOL) 500 MG tablet Take 500 mg by mouth every 6 (six) hours as needed for moderate pain.     apixaban (ELIQUIS) 5 MG TABS tablet Take 1 tablet (5 mg total) by mouth 2 (two) times daily. 180 tablet 3   bisacodyl (DULCOLAX) 5 MG EC tablet Take 5 mg by mouth  daily as needed for moderate constipation.     Carbidopa-Levodopa ER (RYTARY) 36.25-145 MG CPCR 3 capsules at 7am/11am/4pm and 1 capsule at bed (Patient taking differently: 3 capsules at 12 pm and 5 pm1 capsule at bed) 300 capsule 11   Cholecalciferol (VITAMIN D-3) 25 MCG (1000 UT) CAPS Take 1 capsule by mouth daily.     Cyanocobalamin (VITAMIN B-12) 5000 MCG SUBL Place 1 each under the tongue daily.     DULoxetine (CYMBALTA) 60 MG capsule TAKE ONE CAPSULE BY MOUTH TWICE DAILY FOR anxiety AND depression (Patient taking differently: Take 60 mg by mouth 2 (two) times daily. TAKE ONE CAPSULE BY MOUTH TWICE DAILY FOR anxiety AND depression) 180 capsule 0   ezetimibe (ZETIA) 10 MG tablet TAKE ONE TABLET BY MOUTH EVERY MORNING (Patient taking differently: Take 10 mg by mouth daily.) 90 tablet 3   fluticasone (FLONASE) 50 MCG/ACT nasal spray Place 2 sprays into both nostrils daily as needed for rhinitis or allergies. 48 g 3   furosemide (LASIX) 20 MG tablet TAKE ONE TABLET BY MOUTH ONCE DAILY May take additional tablet daily if needed (Patient taking differently: Take 40 mg by mouth 2 (two) times daily.) 180 tablet 3   gabapentin (NEURONTIN) 600 MG tablet Take 600 mg by mouth as needed.     Melatonin 5 MG CHEW Chew 10 mg by mouth at bedtime.     Multiple Vitamins-Minerals (CEROVITE SENIOR PO) Take 1 tablet by mouth daily.     NONFORMULARY OR COMPOUNDED ITEM Dispense 1 custom manual wheelchair evaluation for improved positioning and pressure distribution. G20.A1 1 each 0   oxybutynin (DITROPAN) 5 MG tablet Take 5 mg by mouth 2 (two) times daily.     sotalol (BETAPACE) 120  MG tablet TAKE ONE TABLET BY MOUTH TWICE DAILY (Patient taking differently: Take 120 mg by mouth 2 (two) times daily.) 180 tablet 3   tiZANidine (ZANAFLEX) 4 MG tablet TAKE 2 TABLET (4 MG TOTAL) BY MOUTH DAILY FOR MUSCLE SPASMS AT BEDTIME. (Patient taking differently: Take 4 mg by mouth in the morning and at bedtime.) 180 tablet 3   No  current facility-administered medications for this visit.     Past Medical History:  Diagnosis Date   Anxiety    Atrial fibrillation (HCC)    Cat bite of right hand 03/25/2019   Chronic diastolic CHF (congestive heart failure) (HCC)    a. echo 09/2014: EF 60-65%, diastolic dysfunction, mild LVH, nl RV size & systolic function, mildly dilated LA (4.3 cm), mild MR/TR, mildly elevated PASP 36.7 mm Hg   DDD (degenerative disc disease), cervical    DDD (degenerative disc disease), lumbar    Depression    Diffuse cystic mastopathy 2014   Dyspnea    Dysrhythmia    GERD (gastroesophageal reflux disease)    Gout    Headache    rare   HLD (hyperlipidemia)    a. statin intolerant 2/2 myalgias   Hx of dysplastic nevus 12/25/2018   L medial ankle   Hypercholesterolemia    Hypertension    Malignant neoplasm of upper-outer quadrant of female breast (HCC) 10/2012   Papillary DCIS, sentinel node negative. DR/PR positive. PARTIAL RIGHT MASTECTOMY FOR BREAST CANCER--HAD RADIATION - NO CHEMO --DR. CRYSTAL [place] ONCOLOGIST   Obesity    OSA on CPAP    Osteoarthritis of both knees    a. s/p right TKA 04/2013 & left TKA 09/2014   Otitis externa    Parkinson disease [facility])    Personal history of radiation therapy 2015   RIGHT breast-mammosite per pt   Pneumonia of both lungs due to infectious organism 01/08/2021   Sleep difficulties    LUNESTA HAS HELPED   Vaginal cyst     Past Surgical History:  Procedure Laterality Date   ABDOMINAL HYSTERECTOMY  1992   DUB; fibroids; endometriosis.  One remaining ovary.     BREAST LUMPECTOMY Right 2015   Papillary DCIS, sentinel node negative. DR/PR positive. PARTIAL RIGHT MASTECTOMY FOR BREAST CANCER--HAD RADIATION - NO CHEMO --DR. CRYSTAL [place] ONCOLOGIST   BREAST SURGERY Right 10/2012   Wide excision,APB RT 10 mm papillary DCIS, ER/PR positive. Sentinel node negative. Partial breast radiation.   CATARACT EXTRACTION, BILATERAL  02/13/2016   [facility].    CHOLECYSTECTOMY  1994   COLONOSCOPY  2015   1 benign polyp-every 5 years/ Dr Bluford Kaufmann   COLONOSCOPY WITH PROPOFOL N/A 02/10/2021   Procedure: COLONOSCOPY WITH PROPOFOL;  Surgeon: Earline Mayotte, MD;  Location: [facility] ENDOSCOPY;  Service: Endoscopy;  Laterality: N/A;   ERCP  1995   JOINT REPLACEMENT Right 04/2013   knee   PERICARDIOCENTESIS N/A 11/13/2017   Procedure: PERICARDIOCENTESIS;  Surgeon: Yvonne Kendall, MD;  Location: [facility] INVASIVE CV LAB;  Service: Cardiovascular;  Laterality: N/A;   TOTAL KNEE ARTHROPLASTY Right 04/16/2013   Procedure: RIGHT TOTAL KNEE ARTHROPLASTY;  Surgeon: Shelda Pal, MD;  Location: [facility] ORS;  Service: Orthopedics;  Laterality: Right;   TOTAL KNEE ARTHROPLASTY Left 09/15/2014   Procedure: LEFT TOTAL KNEE ARTHROPLASTY;  Surgeon: Shelda Pal, MD;  Location: [facility] ORS;  Service: Orthopedics;  Laterality: Left;   TUBAL LIGATION  1979   UPPER GI ENDOSCOPY      Social History   Socioeconomic History  Marital status: Widowed    Spouse name: Iantha Fallen   Number of children: 2   Years of education: College   Highest education level: Bachelor's degree (e.g., BA, AB, BS)  Occupational History   Occupation: Retired  Tobacco Use   Smoking status: Never   Smokeless tobacco: Never   Tobacco comments:    social smoker as a teen  Advertising account planner   Vaping status: Never Used  Substance and Sexual Activity   Alcohol use: No   Drug use: No   Sexual activity: Not Currently    Birth control/protection: Post-menopausal, Surgical  Other Topics Concern   Not on file  Social History Narrative   Marital status: widowed since 09/16/2015      Children: 2 children (45, 54); 5 grandchild (4 in [place]; 1 in [place]).      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  Walking daily small amounts.         ADLs: drives; independent with ADLs; no assistant devices      Advanced Directives: none; FULL CODE; no prolonged measures.   Does not need them.  Daughter/Heather Morton Amy is HCPOA.    Social Drivers of Corporate investment banker Strain: Low Risk  (01/06/2022)   Overall Financial Resource Strain (CARDIA)    Difficulty of Paying Living Expenses: Not hard at all  Food Insecurity: No Food Insecurity (01/06/2022)   Hunger Vital Sign    Worried About Running Out of Food in the Last Year: Never true    Ran Out of Food in the Last Year: Never true  Transportation Needs: No Transportation Needs (01/06/2022)   PRAPARE - Administrator, Civil Service (Medical): No    Lack of Transportation (Non-Medical): No  Physical Activity: Insufficiently Active (01/06/2022)   Exercise Vital Sign    Days of Exercise per Week: 7 days    Minutes of Exercise per Session: 10 min  Stress: No Stress Concern Present (01/06/2022)   [employer] of Occupational Health - Occupational Stress Questionnaire    Feeling of Stress : Not at all  Social Connections: Moderately Integrated (10/02/2017)   Social Connection and Isolation Panel [NHANES]    Frequency of Communication with Friends and Family: More than three times a week    Frequency of Social Gatherings with Friends and Family: More than three times a week    Attends Religious Services: More than 4 times per year    Active Member of [employer] or Organizations: Yes    Attends Banker Meetings: More than 4 times per year    Marital Status: Widowed  Intimate Partner Violence: Not At Risk (10/02/2017)   Humiliation, Afraid, Rape, and Kick questionnaire    Fear of Current or Ex-Partner: No    Emotionally Abused: No    Physically Abused: No    Sexually Abused: No    Family History  Problem Relation Age of Onset   Colon cancer Mother    Cancer Mother 36       colon cancer   Melanoma Father    Cancer Father 64       melanoma   Melanoma Sister     Melanoma Sister    Colon  cancer Maternal Grandfather    Breast cancer Maternal Grandfather     ROS: no fevers or chills, productive cough, hemoptysis, dysphasia, odynophagia, melena, hematochezia, dysuria, hematuria, rash, seizure activity, orthopnea, PND, pedal edema, claudication. Remaining systems are negative.  Physical Exam: Well-developed well-nourished in no acute distress.  Skin is warm and dry.  HEENT is normal.  Neck is supple.  Chest is clear to auscultation with normal expansion.  Cardiovascular exam is regular rate and rhythm.  Abdominal exam nontender or distended. No masses palpated. Extremities show trace edema.  A/P  1 paroxysmal atrial fibrillation-patient has had atrial fibrillation in the past in the setting of pericarditis and pneumonia.  Also with short episodes on previous monitor.  Given history of CVA I think she requires long-term anticoagulation and will continue apixaban.  Blood pressure has been running low and will discontinue metoprolol.  2 chronic diastolic congestive heart failure-she appears to be reasonably well compensated.  She has lost weight recently with decreased p.o. intake.  She appears to be dehydrated.  Will discontinue Jardiance, spironolactone and change Lasix to 20 mg daily as needed.  Will have her follow-up with APP in 4 weeks to make sure her volume status remains stable with above measures.  This should also help with her blood pressure.  3 hypertension-blood pressure is low.  Plan as outlined above.  4 hyperlipidemia-continue Zetia.  She is intolerant to statins.  5 history of Parkinson's-patient is at increased risk of fall.  I am concerned about discontinuing apixaban however given probable embolic CVAs in the past.  We discussed the importance of being vigilant with transferring to her chair.  Olga Millers, MD

## 2023-07-24 ENCOUNTER — Telehealth: Payer: Self-pay | Admitting: Cardiology

## 2023-07-24 NOTE — Telephone Encounter (Signed)
Spoke with pt caregiver, she reports the patient keeps her feet on the floor instead of keeping them elevated and her feet will be puffy by the end of the day When she gets up in the morning the swelling is completely gone. She does have support hose but they are not using them right now. Advised to use the support hose during the day and take them off in the evening to see if that will help. They have an appointment 08/01/23 and will call prior to that appointment with concerns.

## 2023-07-24 NOTE — Telephone Encounter (Signed)
Pt c/o swelling/edema: STAT if pt has developed SOB within 24 hours  If swelling, where is the swelling located? Feet  How much weight have you gained and in what time span? NA  Have you gained 2 pounds in a day or 5 pounds in a week? NA  Do you have a log of your daily weights (if so, list)? No  Are you currently taking a fluid pill? Yes  Are you currently SOB? No  Have you traveled recently in a car or plane for an extended period of time? No  Swelling has been going on for 2 weeks now. Pt has taken extra fluid pill and does not seem to be working

## 2023-07-27 ENCOUNTER — Telehealth: Payer: Self-pay | Admitting: Cardiology

## 2023-07-27 ENCOUNTER — Other Ambulatory Visit: Payer: Self-pay | Admitting: Vascular Surgery

## 2023-07-27 DIAGNOSIS — Z1231 Encounter for screening mammogram for malignant neoplasm of breast: Secondary | ICD-10-CM

## 2023-07-27 NOTE — Telephone Encounter (Signed)
Caller Lowanda Foster) stated patient's LDL recent results was 106 and patient had stroke last year.  Caller want call back to directly from RN Stanton Kidney to discuss appropriateness of PCSK9 therapy.

## 2023-07-27 NOTE — Telephone Encounter (Signed)
Spoke with brittany, the patient daughter wanted to make sure we were okay with her starting patient on PCSK9. Aware we are fine with that. They are going to recheck labs in 3 months.

## 2023-07-27 NOTE — Telephone Encounter (Signed)
Left message for Selena Bush to call

## 2023-07-28 ENCOUNTER — Emergency Department (HOSPITAL_COMMUNITY)
Admission: EM | Admit: 2023-07-28 | Discharge: 2023-07-28 | Disposition: A | Discharge status: Home / Self Care | Payer: Medicare (Managed Care) | Attending: Emergency Medicine | Admitting: Emergency Medicine

## 2023-07-28 ENCOUNTER — Emergency Department (HOSPITAL_COMMUNITY): Payer: Medicare (Managed Care)

## 2023-07-28 ENCOUNTER — Other Ambulatory Visit: Payer: Self-pay

## 2023-07-28 ENCOUNTER — Encounter (HOSPITAL_COMMUNITY): Payer: Self-pay | Admitting: Emergency Medicine

## 2023-07-28 DIAGNOSIS — I110 Hypertensive heart disease with heart failure: Secondary | ICD-10-CM | POA: Diagnosis not present

## 2023-07-28 DIAGNOSIS — R55 Syncope and collapse: Secondary | ICD-10-CM | POA: Diagnosis present

## 2023-07-28 DIAGNOSIS — R4182 Altered mental status, unspecified: Secondary | ICD-10-CM

## 2023-07-28 DIAGNOSIS — Z853 Personal history of malignant neoplasm of breast: Secondary | ICD-10-CM | POA: Diagnosis not present

## 2023-07-28 DIAGNOSIS — G20C Parkinsonism, unspecified: Secondary | ICD-10-CM | POA: Diagnosis not present

## 2023-07-28 DIAGNOSIS — I959 Hypotension, unspecified: Secondary | ICD-10-CM | POA: Diagnosis not present

## 2023-07-28 DIAGNOSIS — I509 Heart failure, unspecified: Secondary | ICD-10-CM | POA: Insufficient documentation

## 2023-07-28 DIAGNOSIS — Z79899 Other long term (current) drug therapy: Secondary | ICD-10-CM | POA: Diagnosis not present

## 2023-07-28 DIAGNOSIS — Z7901 Long term (current) use of anticoagulants: Secondary | ICD-10-CM | POA: Insufficient documentation

## 2023-07-28 DIAGNOSIS — I5032 Chronic diastolic (congestive) heart failure: Secondary | ICD-10-CM | POA: Diagnosis not present

## 2023-07-28 DIAGNOSIS — Z8673 Personal history of transient ischemic attack (TIA), and cerebral infarction without residual deficits: Secondary | ICD-10-CM | POA: Insufficient documentation

## 2023-07-28 DIAGNOSIS — I4891 Unspecified atrial fibrillation: Secondary | ICD-10-CM | POA: Diagnosis not present

## 2023-07-28 LAB — I-STAT CHEM 8, ED
BUN: 21 mg/dL (ref 8–23)
Calcium, Ion: 0.91 mmol/L — ABNORMAL LOW (ref 1.15–1.40)
Chloride: 111 mmol/L (ref 98–111)
Creatinine, Ser: 1 mg/dL (ref 0.44–1.00)
Glucose, Bld: 78 mg/dL (ref 70–99)
HCT: 36 % (ref 36.0–46.0)
Hemoglobin: 12.2 g/dL (ref 12.0–15.0)
Potassium: 3.3 mmol/L — ABNORMAL LOW (ref 3.5–5.1)
Sodium: 142 mmol/L (ref 135–145)
TCO2: 22 mmol/L (ref 22–32)

## 2023-07-28 LAB — DIFFERENTIAL
Abs Immature Granulocytes: 0.03 10*3/uL (ref 0.00–0.07)
Basophils Absolute: 0 10*3/uL (ref 0.0–0.1)
Basophils Relative: 1 %
Eosinophils Absolute: 0 10*3/uL (ref 0.0–0.5)
Eosinophils Relative: 0 %
Immature Granulocytes: 1 %
Lymphocytes Relative: 32 %
Lymphs Abs: 1.5 10*3/uL (ref 0.7–4.0)
Monocytes Absolute: 0.5 10*3/uL (ref 0.1–1.0)
Monocytes Relative: 11 %
Neutro Abs: 2.6 10*3/uL (ref 1.7–7.7)
Neutrophils Relative %: 55 %

## 2023-07-28 LAB — RAPID URINE DRUG SCREEN, HOSP PERFORMED
Amphetamines: NOT DETECTED
Barbiturates: NOT DETECTED
Benzodiazepines: NOT DETECTED
Cocaine: NOT DETECTED
Opiates: NOT DETECTED
Tetrahydrocannabinol: NOT DETECTED

## 2023-07-28 LAB — URINALYSIS, ROUTINE W REFLEX MICROSCOPIC
Bilirubin Urine: NEGATIVE
Glucose, UA: >=500 mg/dL — AB
Hgb urine dipstick: NEGATIVE
Ketones, ur: 5 mg/dL — AB
Nitrite: NEGATIVE
Protein, ur: NEGATIVE mg/dL
Specific Gravity, Urine: 1.018 (ref 1.005–1.030)
pH: 5 (ref 5.0–8.0)

## 2023-07-28 LAB — CBC
HCT: 45.2 % (ref 36.0–46.0)
Hemoglobin: 13.2 g/dL (ref 12.0–15.0)
MCH: 32.4 pg (ref 26.0–34.0)
MCHC: 29.2 g/dL — ABNORMAL LOW (ref 30.0–36.0)
MCV: 111.1 fL — ABNORMAL HIGH (ref 80.0–100.0)
Platelets: 100 10*3/uL — ABNORMAL LOW (ref 150–400)
RBC: 4.07 MIL/uL (ref 3.87–5.11)
RDW: 12.7 % (ref 11.5–15.5)
WBC: 4.7 10*3/uL (ref 4.0–10.5)
nRBC: 0 % (ref 0.0–0.2)

## 2023-07-28 LAB — APTT: aPTT: 31 s (ref 24–36)

## 2023-07-28 LAB — COMPREHENSIVE METABOLIC PANEL
ALT: 7 U/L (ref 0–44)
AST: 30 U/L (ref 15–41)
Albumin: 2.8 g/dL — ABNORMAL LOW (ref 3.5–5.0)
Alkaline Phosphatase: 60 U/L (ref 38–126)
Anion gap: 9 (ref 5–15)
BUN: 19 mg/dL (ref 8–23)
CO2: 23 mmol/L (ref 22–32)
Calcium: 8.8 mg/dL — ABNORMAL LOW (ref 8.9–10.3)
Chloride: 109 mmol/L (ref 98–111)
Creatinine, Ser: 0.95 mg/dL (ref 0.44–1.00)
GFR, Estimated: >60 mL/min (ref >60)
Glucose, Bld: 83 mg/dL (ref 70–99)
Potassium: 3.7 mmol/L (ref 3.5–5.1)
Sodium: 141 mmol/L (ref 135–145)
Total Bilirubin: 0.6 mg/dL (ref <1.2)
Total Protein: 5.8 g/dL — ABNORMAL LOW (ref 6.5–8.1)

## 2023-07-28 LAB — CBG MONITORING, ED
Glucose-Capillary: 102 mg/dL — ABNORMAL HIGH (ref 70–99)
Glucose-Capillary: 26 mg/dL — CL (ref 70–99)

## 2023-07-28 LAB — PROTIME-INR
INR: 1.5 — ABNORMAL HIGH (ref 0.8–1.2)
Prothrombin Time: 17.9 s — ABNORMAL HIGH (ref 11.4–15.2)

## 2023-07-28 LAB — ETHANOL: Alcohol, Ethyl (B): <10 mg/dL (ref <10)

## 2023-07-28 MED ORDER — SODIUM CHLORIDE 0.9 % IV BOLUS
500.0000 mL | Freq: Once | INTRAVENOUS | Status: AC
  Administered 2023-07-28: 500 mL via INTRAVENOUS

## 2023-07-28 NOTE — ED Triage Notes (Signed)
Pt BIB [facility] from home due to slumping over chair and not acting normal since 1000 this morning 07/28/2023.  Pt was eating breakfast and slumped over.  Pt did have two strokes back in June of this year.  Also has congestive heart failure.   VS BP 80/40, HR 50, CBG 150.  18g right wrist.

## 2023-07-28 NOTE — Discharge Instructions (Signed)
You were seen in the emergency department after your near syncopal episode.  Your blood pressure was low when EMS arrived which is probably what caused you to nearly pass out.  Your workup showed no signs of severe dehydration or stroke.  You can follow-up with your primary doctor or your cardiologist to have your symptoms rechecked and to see if you need any further medication changes.  You should return to the emergency department if having recurrent episodes of low blood pressure, you pass out or if you have any other new or concerning symptoms.

## 2023-07-28 NOTE — ED Provider Notes (Signed)
[place] EMERGENCY DEPARTMENT AT [facility] Provider Note   CSN: [id number] Arrival date & time: 07/28/23  1158  An emergency department physician performed an initial assessment on this suspected stroke patient at 1155.  History  Chief Complaint  Patient presents with   Code Stroke    Selena Bush is a 77 y.o. female.  Patient is a 77 year old female with a past medical history of A-fib on Eliquis, Parkinson's, CVA with right-sided deficits, CHF presenting to the emergency department as a code stroke.  Patient is here with her caretaker who states that she was sitting at the table to eat lunch this morning and suddenly became unresponsive.  She was in her wheelchair and was laid back in her chair and had improvement of her symptoms.  Patient reports that she had this feeling come over her that she needed to go to sleep but states that she never completely lost consciousness.  She denies any headache or chest pain.  Her caretaker reports that she has been having increasing shortness of breath upon sitting up.  Patient denies any fever, cough or congestion, nausea, vomiting or diarrhea.  Per EMS she was hypotensive to the 80s on their arrival and received a small bolus of fluids and route.  The history is provided by the patient, the EMS personnel and a caregiver.       Home Medications Prior to Admission medications   Medication Sig Start Date End Date Taking? Authorizing Provider  acetaminophen (TYLENOL) 500 MG tablet Take 500 mg by mouth every 6 (six) hours as needed for moderate pain.    [provider]  apixaban (ELIQUIS) 5 MG TABS tablet Take 1 tablet (5 mg total) by mouth 2 (two) times daily. 02/21/23   Alver Sorrow, NP  bisacodyl (DULCOLAX) 5 MG EC tablet Take 5 mg by mouth daily as needed for moderate constipation.    [provider]  Carbidopa-Levodopa ER (RYTARY) 36.25-145 MG CPCR 3 capsules at 7am/11am/4pm and 1 capsule at bed Patient  taking differently: 3 capsules at 12 pm and 5 pm1 capsule at bed 04/26/23   Tat, Octaviano Batty, DO  Cholecalciferol (VITAMIN D-3) 25 MCG (1000 UT) CAPS Take 1 capsule by mouth daily.    [provider]  Cyanocobalamin (VITAMIN B-12) 5000 MCG SUBL Place 1 each under the tongue daily.    [provider]  DULoxetine (CYMBALTA) 60 MG capsule TAKE ONE CAPSULE BY MOUTH TWICE DAILY FOR anxiety AND depression Patient taking differently: Take 60 mg by mouth 2 (two) times daily. TAKE ONE CAPSULE BY MOUTH TWICE DAILY FOR anxiety AND depression 04/19/22   Doreene Nest, NP  ezetimibe (ZETIA) 10 MG tablet TAKE ONE TABLET BY MOUTH EVERY MORNING Patient taking differently: Take 10 mg by mouth daily. 06/23/21   Lewayne Bunting, MD  fluticasone (FLONASE) 50 MCG/ACT nasal spray Place 2 sprays into both nostrils daily as needed for rhinitis or allergies. 07/21/21   Doreene Nest, NP  furosemide (LASIX) 20 MG tablet TAKE ONE TABLET BY MOUTH ONCE DAILY May take additional tablet daily if needed Patient taking differently: Take 40 mg by mouth 2 (two) times daily. 02/17/22   Delma Freeze, FNP  gabapentin (NEURONTIN) 600 MG tablet Take 600 mg by mouth as needed.    [provider]  JARDIANCE 10 MG TABS tablet TAKE ONE TABLET BY MOUTH BEFORE BREAKFAST Patient taking differently: Take 10 mg by mouth daily. 12/20/21   Delma Freeze, FNP  Melatonin 5 MG CHEW Chew 10 mg by mouth at bedtime.    [provider]  metoprolol tartrate (LOPRESSOR) 25 MG tablet Take 0.5 tablets (12.5 mg total) by mouth 2 (two) times daily. 05/18/23 08/16/23  Azalee Course, PA  Multiple Vitamins-Minerals (CEROVITE SENIOR PO) Take 1 tablet by mouth daily.    [provider]  NONFORMULARY OR COMPOUNDED ITEM Dispense 1 custom manual wheelchair evaluation for improved positioning and pressure distribution. G20.A1 05/02/23   Tat, Octaviano Batty, DO  oxybutynin (DITROPAN) 5 MG tablet Take 5 mg by mouth 2 (two) times  daily. 11/01/21   [provider]  sotalol (BETAPACE) 120 MG tablet TAKE ONE TABLET BY MOUTH TWICE DAILY Patient taking differently: Take 120 mg by mouth 2 (two) times daily. 05/26/21   Lewayne Bunting, MD  spironolactone (ALDACTONE) 25 MG tablet Take 1 tablet (25 mg total) by mouth daily. 02/18/22   Lewayne Bunting, MD  tiZANidine (ZANAFLEX) 4 MG tablet TAKE 2 TABLET (4 MG TOTAL) BY MOUTH DAILY FOR MUSCLE SPASMS AT BEDTIME. Patient taking differently: Take 4 mg by mouth in the morning and at bedtime. 06/22/21   Doreene Nest, NP      Allergies    Penicillins, Sulfa antibiotics, Codeine, Statins, Xarelto [rivaroxaban], and Aspirin    Review of Systems   Review of Systems  Physical Exam Updated Vital Signs BP 116/69   Pulse (!) 51   Temp 98 F (36.7 C) (Oral)   Resp 19   Wt 68.4 kg   SpO2 100%   BMI 28.49 kg/m  Physical Exam Vitals and nursing note reviewed.  Constitutional:      General: She is not in acute distress.    Appearance: Normal appearance.  HENT:     Head: Normocephalic and atraumatic.     Nose: Nose normal.     Mouth/Throat:     Mouth: Mucous membranes are moist.     Pharynx: Oropharynx is clear.  Eyes:     Extraocular Movements: Extraocular movements intact.     Conjunctiva/sclera: Conjunctivae normal.  Cardiovascular:     Rate and Rhythm: Normal rate and regular rhythm.     Heart sounds: Normal heart sounds.  Pulmonary:     Effort: Pulmonary effort is normal.     Breath sounds: Normal breath sounds.  Abdominal:     General: Abdomen is flat.     Palpations: Abdomen is soft.     Tenderness: There is no abdominal tenderness.  Musculoskeletal:        General: Normal range of motion.     Cervical back: Normal range of motion.  Skin:    General: Skin is warm and dry.  Neurological:     General: No focal deficit present.     Mental Status: She is alert and oriented to person, place, and time.     Cranial Nerves: No cranial nerve deficit.      Sensory: No sensory deficit.     Motor: No weakness.     Coordination: Coordination normal.     Comments: No drift in all 4 extremities  Psychiatric:        Mood and Affect: Mood normal.        Behavior: Behavior normal.     ED Results / Procedures / Treatments   Labs (all labs ordered are listed, but only abnormal results are displayed) Labs Reviewed  CBC - Abnormal; Notable for the following components:      Result Value  MCV 111.1 (*)    MCHC 29.2 (*)    Platelets 100 (*)    All other components within normal limits  COMPREHENSIVE METABOLIC PANEL - Abnormal; Notable for the following components:   Calcium 8.8 (*)    Total Protein 5.8 (*)    Albumin 2.8 (*)    All other components within normal limits  PROTIME-INR - Abnormal; Notable for the following components:   Prothrombin Time 17.9 (*)    INR 1.5 (*)    All other components within normal limits  CBG MONITORING, ED - Abnormal; Notable for the following components:   Glucose-Capillary 26 (*)    All other components within normal limits  CBG MONITORING, ED - Abnormal; Notable for the following components:   Glucose-Capillary 102 (*)    All other components within normal limits  I-STAT CHEM 8, ED - Abnormal; Notable for the following components:   Potassium 3.3 (*)    Calcium, Ion 0.91 (*)    All other components within normal limits  ETHANOL  DIFFERENTIAL  APTT  RAPID URINE DRUG SCREEN, HOSP PERFORMED  URINALYSIS, ROUTINE W REFLEX MICROSCOPIC    EKG EKG Interpretation Date/Time:  Friday July 28 2023 12:17:59 EST Ventricular Rate:  53 PR Interval:  165 QRS Duration:  94 QT Interval:  494 QTC Calculation: 464 R Axis:   -1  Text Interpretation: Sinus rhythm LVH with secondary repolarization abnormality Anterior Q waves, possibly due to LVH No significant change since last tracing Confirmed by Elayne Snare ([id number]) on 07/28/2023 12:31:07 PM  Radiology DG Chest Port 1 View Result Date:  07/28/2023 CLINICAL DATA:  Shortness of breath. EXAM: PORTABLE CHEST 1 VIEW COMPARISON:  01/03/2023. FINDINGS: Low lung volume. Note is again made of markedly elevated right hemidiaphragm. There are bilateral nonspecific opacities which are similar to the prior study and may represent underlying atelectasis and/or fibrosis. Bilateral lung fields are otherwise clear. No acute consolidation or lung collapse. Bilateral costophrenic angles are clear. Normal cardio-mediastinal silhouette. No acute osseous abnormalities. The soft tissues are within normal limits. IMPRESSION: No active disease. Electronically Signed   By: Jules Schick M.D.   On: 07/28/2023 15:16   CT HEAD CODE STROKE WO CONTRAST Result Date: 07/28/2023 CLINICAL DATA:  Code stroke.  Weakness. EXAM: CT HEAD WITHOUT CONTRAST TECHNIQUE: Contiguous axial images were obtained from the base of the skull through the vertex without intravenous contrast. RADIATION DOSE REDUCTION: This exam was performed according to the departmental dose-optimization program which includes automated exposure control, adjustment of the mA and/or kV according to patient size and/or use of iterative reconstruction technique. COMPARISON:  MR head without contrast 01/13/2023. FINDINGS: Brain: Moderate atrophy and diffuse white matter disease is again seen. No acute infarct, hemorrhage, or mass lesion is present. Deep brain nuclei are within normal limits. The ventricles are of normal size. No significant extraaxial fluid collection is present. The brainstem and cerebellum are within normal limits. Midline structures are within normal limits. Vascular: Atherosclerotic calcifications are present within the cavernous internal carotid arteries bilaterally. No hyperdense vessel is present. Skull: Calvarium is intact. No focal lytic or blastic lesions are present. No significant extracranial soft tissue lesion is present. Sinuses/Orbits: Scattered ethmoid opacification is present. The  visualized right maxillary sinus is opacified. The sphenoid sinuses are opacified bilaterally. The mastoid air cells are clear. Bilateral lens replacements are noted. Globes and orbits are otherwise unremarkable. ASPECTS [facility] Stroke Program Early CT Score) - Ganglionic level infarction (caudate, lentiform nuclei, internal capsule, insula, M1-M3 cortex):  7/7 - Supraganglionic infarction (M4-M6 cortex): 3/3 Total score (0-10 with 10 being normal): 10/10 IMPRESSION: 1. No acute intracranial abnormality or significant interval change. 2. Stable atrophy and diffuse white matter disease. This likely reflects the sequela of chronic microvascular ischemia. 3. Aspects is 10/10. 4. Chronic sinus disease. The above was relayed via text pager to Dr. Ritta Slot on 07/28/2023 at 12:12 . Electronically Signed   By: Marin Roberts M.D.   On: 07/28/2023 12:15    Procedures Procedures    Medications Ordered in ED Medications  sodium chloride 0.9 % bolus 500 mL (0 mLs Intravenous Stopped 07/28/23 1311)    ED Course/ Medical Decision Making/ A&P Clinical Course as of 07/28/23 1541  Fri Jul 28, 2023  1414 Labs within normal range. CXR and UA pending. [VK]  1541 Patient signed out to Dr. Rodena Medin pending UA with plan for likely discharge home. [VK]    Clinical Course User Index [VK] Rexford Maus, DO                                 Medical Decision Making This patient presents to the ED with chief complaint(s) of hypotension, near syncope with pertinent past medical history of Parkinson's, prior CVA with mild right sided deficits, A-fib on Eliquis, CHF which further complicates the presenting complaint. The complaint involves an extensive differential diagnosis and also carries with it a high risk of complications and morbidity.    The differential diagnosis includes CVA, TIA, ACS, arrhythmia, anemia, dehydration, electrolyte abnormality, infection  Additional history  obtained: Additional history obtained from EMS  Records reviewed outpatient cardiology records  ED Course and Reassessment: On patient's arrival she had soft blood pressures but otherwise was hemodynamically stable in no acute distress.  She was made a prehospital arrival stroke alert and was evaluated by neurology and myself on her arrival.  She had no focal neurologic deficits and was transported to CT scanner.  Neurology is suspect symptoms are likely related to hypotension and recommended further medical workup.  Patient will have labs, urine and x-ray and orthostatics performed.  She will be given gentle fluids and will be closely reassessed.  Independent labs interpretation:  The following labs were independently interpreted: within normal range  Independent visualization of imaging: - I independently visualized the following imaging with scope of interpretation limited to determining acute life threatening conditions related to emergency care: CTH, CXR, which revealed no acute disease  Consultation: - Consulted or discussed management/test interpretation w/ external professional: neurology     Amount and/or Complexity of Data Reviewed Labs: ordered. Radiology: ordered.          Final Clinical Impression(s) / ED Diagnoses Final diagnoses:  Transient hypotension  Near syncope    Rx / DC Orders ED Discharge Orders     None         Rexford Maus, DO 07/28/23 1541

## 2023-07-28 NOTE — ED Notes (Signed)
Phlebotomy to stick for PT/INR and APTT

## 2023-07-28 NOTE — ED Notes (Signed)
Port XR at bedside.

## 2023-07-28 NOTE — Progress Notes (Addendum)
Attempted to respond to code stemi, code stemi canceled

## 2023-07-28 NOTE — ED Provider Notes (Signed)
Patient seen after prior ED provider.  Patient feels improved.  Workup on the whole is without evidence of significant acute pathology.  Patient offered observation admission.  She declined same.  She prefers to go home.  Importance of close follow-up is stressed.  Strict return precautions given and understood.     Wynetta Fines, MD 07/28/23 [phone]

## 2023-07-28 NOTE — Code Documentation (Signed)
Stroke Response Nurse Documentation Code Documentation  CHUMY MOMENT is a 77 y.o. female arriving to [facility]  via [facility] on 07/28/2023 with past medical hx of anxiety, afib, CHF, HTN, gout, headache, HLD, obesity. On Eliquis (apixaban) daily. Code stroke was activated by EMS.   Patient from home where she was LKW at 1000 and now complaining of lethargy/leaning to the right, altered mental status. Per patient, she was eating breakfast when it felt like she was going to go to sleep whether or not she wanted to. Per her friend, she slumped to the right and was unresponsive while eating breakfast.   Stroke team at the bedside on patient arrival. Labs drawn and patient cleared for CT by Dr. Theresia Lo. Patient to CT with team. NIHSS 1, see documentation for details and code stroke times. Patient with left limb ataxia on exam. The following imaging was completed:  CT Head. Patient is not a candidate for IV Thrombolytic due to symptoms too mild and patient on eliquis. Patient is not not a candidate for IR due to no LVO on imaging per MD.   Care Plan: VS/NIHSS q2hr x12hr, then q4hrs; BP Goal <220/120.   Bedside handoff with ED RN Raymar.    Felecia Jan  Stroke Response RN

## 2023-07-28 NOTE — Consult Note (Signed)
NEUROLOGY CONSULT NOTE   Date of service: July 28, 2023 Patient Name: Selena Bush MRN:  [id number] DOB:  [birth year] Chief Complaint: Code Stroke History is obtained from: EMS personnel, care giver and patient  History of Present Illness  Selena Bush is a 77 y.o. female  has a past medical history of Anxiety, Atrial fibrillation (HCC), Cat bite of right hand (03/25/2019), Chronic diastolic CHF (congestive heart failure) (HCC), DDD (degenerative disc disease), cervical, DDD (degenerative disc disease), lumbar, Depression, Diffuse cystic mastopathy (2014), Dyspnea, Dysrhythmia, GERD (gastroesophageal reflux disease), Gout, Headache, HLD (hyperlipidemia), dysplastic nevus (12/25/2018), Hypercholesterolemia, Hypertension, Malignant neoplasm of upper-outer quadrant of female breast (HCC) (10/2012), Obesity, OSA on CPAP, Osteoarthritis of both knees, Otitis externa, Parkinson disease (HCC), Personal history of radiation therapy (2015), Pneumonia of both lungs due to infectious organism (01/08/2021), Sleep difficulties, and Vaginal cyst. who presents with  from home via EMS for acute onset of altered mental status and leaning to the right. EMS activated a code stroke. LKW 1000. EMS reports SBP 80/40 and was given fluid and BP improved to 100/60. CBG 101 in ED. Patient states she she was eating breakfast att he table and suddenly felt like going to sleep, never loss consciousness. She denies lightheadedness. She state she feels as if she is back to baseline.  She has a history of A fib on Eliquis and has not missed any doses  Code stroke CT head no acute process, Aspects 10    LKW: 1000 Modified rankin score: 4-Needs assistance to walk and tend to bodily needs IV Thrombolysis: No, not a stroke  EVT: No, no LVO   1a Level of Conscious.: 0 1b LOC Questions: 0 1c LOC Commands: 0 2 Best Gaze: 0 3 Visual: 0 4 Facial Palsy: 0 5a Motor Arm - left: 0 5b Motor Arm - Right: 0 6a Motor Leg - Left:  0 6b Motor Leg - Right: 0 7 Limb Ataxia: 1 8 Sensory: 0 9 Best Language: 0 10 Dysarthria: 0 11 Extinct. and Inatten.: 0 TOTAL: 1    ROS  Comprehensive ROS performed and pertinent positives documented in HPI    Past History   Past Medical History:  Diagnosis Date   Anxiety    Atrial fibrillation (HCC)    Cat bite of right hand 03/25/2019   Chronic diastolic CHF (congestive heart failure) (HCC)    a. echo 09/2014: EF 60-65%, diastolic dysfunction, mild LVH, nl RV size & systolic function, mildly dilated LA (4.3 cm), mild MR/TR, mildly elevated PASP 36.7 mm Hg   DDD (degenerative disc disease), cervical    DDD (degenerative disc disease), lumbar    Depression    Diffuse cystic mastopathy 2014   Dyspnea    Dysrhythmia    GERD (gastroesophageal reflux disease)    Gout    Headache    rare   HLD (hyperlipidemia)    a. statin intolerant 2/2 myalgias   Hx of dysplastic nevus 12/25/2018   L medial ankle   Hypercholesterolemia    Hypertension    Malignant neoplasm of upper-outer quadrant of female breast (HCC) 10/2012   Papillary DCIS, sentinel node negative. DR/PR positive. PARTIAL RIGHT MASTECTOMY FOR BREAST CANCER--HAD RADIATION - NO CHEMO --DR. CRYSTAL [place] ONCOLOGIST   Obesity    OSA on CPAP    Osteoarthritis of both knees    a. s/p right TKA 04/2013 & left TKA 09/2014   Otitis externa    Parkinson disease [facility])    Personal history of radiation  therapy 2015   RIGHT breast-mammosite per pt   Pneumonia of both lungs due to infectious organism 01/08/2021   Sleep difficulties    LUNESTA HAS HELPED   Vaginal cyst     Past Surgical History:  Procedure Laterality Date   ABDOMINAL HYSTERECTOMY  1992   DUB; fibroids; endometriosis.  One remaining ovary.     BREAST LUMPECTOMY Right 2015   Papillary DCIS, sentinel node negative. DR/PR positive. PARTIAL RIGHT MASTECTOMY FOR BREAST CANCER--HAD RADIATION - NO CHEMO --DR. CRYSTAL [place] ONCOLOGIST   BREAST SURGERY Right  10/2012   Wide excision,APB RT 10 mm papillary DCIS, ER/PR positive. Sentinel node negative. Partial breast radiation.   CATARACT EXTRACTION, BILATERAL  02/13/2016   [facility].   CHOLECYSTECTOMY  1994   COLONOSCOPY  2015   1 benign polyp-every 5 years/ Dr Bluford Kaufmann   COLONOSCOPY WITH PROPOFOL N/A 02/10/2021   Procedure: COLONOSCOPY WITH PROPOFOL;  Surgeon: Earline Mayotte, MD;  [facility] END[facility]COPY;  Service: Endoscopy;  Laterality: N/A;   ERCP  1995   JOINT REPLACEMENT Right 04/2013   knee   PERICARDIOCENTESIS N/A 11/13/2017   Procedure: PERICARDIOCENTESIS;  Surgeon: Yvonne Kendall, MD;  Location: [facility] INVASIVE CV LAB;  Service: Cardiovascular;  Laterality: N/A;   TOTAL KNEE ARTHROPLASTY Right 04/16/2013   Procedure: RIGHT TOTAL KNEE ARTHROPLASTY;  Surgeon: Shelda Pal, MD;  Location: [facility] ORS;  Service: Orthopedics;  Laterality: Right;   TOTAL KNEE ARTHROPLASTY Left 09/15/2014   Procedure: LEFT TOTAL KNEE ARTHROPLASTY;  Surgeon: Shelda Pal, MD;  Location: [facility] ORS;  Service: Orthopedics;  Laterality: Left;   TUBAL LIGATION  1979   UPPER GI ENDOSCOPY      Family History: Family History  Problem Relation Age of Onset   Colon cancer Mother    Cancer Mother 110       colon cancer   Melanoma Father    Cancer Father 31       melanoma   Melanoma Sister    Melanoma Sister    Colon cancer Maternal Grandfather    Breast cancer Maternal Grandfather     Social History  reports that she has never smoked. She has never used smokeless tobacco. She reports that she does not drink alcohol and does not use drugs.  Allergies  Allergen Reactions   Penicillins Anaphylaxis    anaphylaxis  Has patient had a PCN reaction causing immediate rash, facial/tongue/throat swelling, SOB or lightheadedness with hypotension: Yes Has patient had a PCN reaction causing severe rash involving mucus membranes or skin necrosis: No Has patient had a PCN reaction that required hospitalization: No Has patient  had a PCN reaction occurring within the last 10 years: No If all of the above answers are "NO", then may proceed with Cephalosporin use.    Sulfa Antibiotics Anaphylaxis   Codeine Other (See Comments) and Nausea And Vomiting    Gi problems    Statins Other (See Comments)    Leg pains   Xarelto [Rivaroxaban] Swelling    pericarditis   Aspirin Other (See Comments)    "burned my stomach intensely" Abdominal pain and burning    Medications   Current Facility-Administered Medications:    sodium chloride 0.9 % bolus 500 mL, 500 mL, Intravenous, Once, Kingsley, Victoria K, DO  Current Outpatient Medications:    acetaminophen (TYLENOL) 500 MG tablet, Take 500 mg by mouth every 6 (six) hours as needed for moderate pain., Disp: , Rfl:    apixaban (ELIQUIS) 5 MG TABS tablet, Take 1  tablet (5 mg total) by mouth 2 (two) times daily., Disp: 180 tablet, Rfl: 3   bisacodyl (DULCOLAX) 5 MG EC tablet, Take 5 mg by mouth daily as needed for moderate constipation., Disp: , Rfl:    Carbidopa-Levodopa ER (RYTARY) 36.25-145 MG CPCR, 3 capsules at 7am/11am/4pm and 1 capsule at bed (Patient taking differently: 3 capsules at 12 pm and 5 pm1 capsule at bed), Disp: 300 capsule, Rfl: 11   Cholecalciferol (VITAMIN D-3) 25 MCG (1000 UT) CAPS, Take 1 capsule by mouth daily., Disp: , Rfl:    Cyanocobalamin (VITAMIN B-12) 5000 MCG SUBL, Place 1 each under the tongue daily., Disp: , Rfl:    DULoxetine (CYMBALTA) 60 MG capsule, TAKE ONE CAPSULE BY MOUTH TWICE DAILY FOR anxiety AND depression (Patient taking differently: Take 60 mg by mouth 2 (two) times daily. TAKE ONE CAPSULE BY MOUTH TWICE DAILY FOR anxiety AND depression), Disp: 180 capsule, Rfl: 0   ezetimibe (ZETIA) 10 MG tablet, TAKE ONE TABLET BY MOUTH EVERY MORNING (Patient taking differently: Take 10 mg by mouth daily.), Disp: 90 tablet, Rfl: 3   fluticasone (FLONASE) 50 MCG/ACT nasal spray, Place 2 sprays into both nostrils daily as needed for rhinitis or  allergies., Disp: 48 g, Rfl: 3   furosemide (LASIX) 20 MG tablet, TAKE ONE TABLET BY MOUTH ONCE DAILY May take additional tablet daily if needed (Patient taking differently: Take 40 mg by mouth 2 (two) times daily.), Disp: 180 tablet, Rfl: 3   gabapentin (NEURONTIN) 600 MG tablet, Take 600 mg by mouth as needed., Disp: , Rfl:    JARDIANCE 10 MG TABS tablet, TAKE ONE TABLET BY MOUTH BEFORE BREAKFAST (Patient taking differently: Take 10 mg by mouth daily.), Disp: 30 tablet, Rfl: 5   Melatonin 5 MG CHEW, Chew 10 mg by mouth at bedtime., Disp: , Rfl:    metoprolol tartrate (LOPRESSOR) 25 MG tablet, Take 0.5 tablets (12.5 mg total) by mouth 2 (two) times daily., Disp: 180 tablet, Rfl: 3   Multiple Vitamins-Minerals (CEROVITE SENIOR PO), Take 1 tablet by mouth daily., Disp: , Rfl:    NONFORMULARY OR COMPOUNDED ITEM, Dispense 1 custom manual wheelchair evaluation for improved positioning and pressure distribution. G20.A1, Disp: 1 each, Rfl: 0   oxybutynin (DITROPAN) 5 MG tablet, Take 5 mg by mouth 2 (two) times daily., Disp: , Rfl:    sotalol (BETAPACE) 120 MG tablet, TAKE ONE TABLET BY MOUTH TWICE DAILY (Patient taking differently: Take 120 mg by mouth 2 (two) times daily.), Disp: 180 tablet, Rfl: 3   spironolactone (ALDACTONE) 25 MG tablet, Take 1 tablet (25 mg total) by mouth daily., Disp: 90 tablet, Rfl: 1   tiZANidine (ZANAFLEX) 4 MG tablet, TAKE 2 TABLET (4 MG TOTAL) BY MOUTH DAILY FOR MUSCLE SPASMS AT BEDTIME. (Patient taking differently: Take 4 mg by mouth in the morning and at bedtime.), Disp: 180 tablet, Rfl: 3  Vitals   Vitals:   07/28/23 1200 07/28/23 1212  BP:  114/66  Pulse:  (!) 53  Resp:  19  Temp:  98 F (36.7 C)  TempSrc:  Oral  SpO2:  94%  Weight: 68.4 kg     Body mass index is 28.49 kg/m.  Physical Exam   Constitutional: frail elderly woman in NAD Psych: Affect appropriate to situation.  Eyes: No scleral injection.  HENT: No OP obstruction.  Head: Normocephalic.   Cardiovascular: Normal rate and regular rhythm.  Respiratory: Effort normal, non-labored breathing.  GI: Soft.  No distension. There is no tenderness.  Skin: WDI.   Neurologic Examination   Neuro: Mental Status: Patient is awake, alert, oriented to person, place, month, year, and situation. Patient is able to give a clear and coherent history. No signs of aphasia or neglect Cranial Nerves: II: Visual Fields are full. Pupils are equal, round, and reactive to light.    III,IV, VI: EOMI without ptosis or diploplia.  V: Facial sensation is symmetric to temperature VII: Facial movement is symmetric resting and smiling VIII: Hearing is intact to voice X: Palate elevates symmetrically XI: Shoulder shrug is symmetric. XII: Tongue protrudes midline without atrophy or fasciculations.  Motor: Tone is normal. Bulk is normal. 5/5 strength was present in bilateral uppers lower extremities 4/5 Sensory: Sensation is symmetric to light touch and temperature in the arms and legs. No extinction to DSS present.  Cerebellar: FNF ataxia on left, unable to Heel to shin     Labs/Imaging/Neurodiagnostic studies   CBC: No results for input(s): "WBC", "NEUTROABS", "HGB", "HCT", "MCV", "PLT" in the last 168 hours.  Basic Metabolic Panel:  Lab Results  Component Value Date   NA 137 06/05/2022   K 3.7 06/05/2022   CO2 26 06/05/2022   GLUCOSE 95 06/05/2022   BUN 20 06/05/2022   CREATININE 0.98 06/05/2022   CALCIUM 9.6 06/05/2022   GFRNONAA >60 06/05/2022   GFRAA 54 (L) 07/15/2019    Lipid Panel:  Lab Results  Component Value Date   LDLCALC 84 12/31/2020    HgbA1c:  Lab Results  Component Value Date   HGBA1C 5.8 (A) 06/22/2021    Urine Drug Screen: No results found for: "LABOPIA", "COCAINSCRNUR", "LABBENZ", "AMPHETMU", "THCU", "LABBARB"   Alcohol Level No results found for: "ETH"  INR  Lab Results  Component Value Date   INR 0.96 09/09/2014    APTT  Lab Results  Component  Value Date   APTT 24 09/09/2014    AED levels: No results found for: "PHENYTOIN", "ZONISAMIDE", "LAMOTRIGINE", "LEVETIRACETA"    Code Stroke CT Head without contrast(Personally reviewed): No acute process, Aspects 10    ASSESSMENT   Selena Bush is a 77 y.o. female  has a past medical history of Anxiety, Atrial fibrillation (HCC), Cat bite of right hand (03/25/2019), Chronic diastolic CHF (congestive heart failure) (HCC), DDD (degenerative disc disease), cervical, DDD (degenerative disc disease), lumbar, Depression, Diffuse cystic mastopathy (2014), Dyspnea, Dysrhythmia, GERD (gastroesophageal reflux disease), Gout, Headache, HLD (hyperlipidemia), dysplastic nevus (12/25/2018), Hypercholesterolemia, Hypertension, Malignant neoplasm of upper-outer quadrant of female breast (HCC) (10/2012), Obesity, OSA on CPAP, Osteoarthritis of both knees, Otitis externa, Parkinson disease (HCC), Personal history of radiation therapy (2015), Pneumonia of both lungs due to infectious organism (01/08/2021), Sleep difficulties, and Vaginal cyst. who presents with  from home via EMS for acute onset of altered mental status and leaning to the right. She was hypotensive with EMS and received a fluid bolus with improvement in BP and mentation.     RECOMMENDATIONS  -CT head with no acute process, altered mentation likely due to hypotension. No further neurological workup needed at this time  - Neurology will sign off. Please call with questions or concerns.  ______________________________________________________________    Gevena Mart DNP, ACNPC-AG  [facility]  I have seen the patient reviewed the above note.  Her blood pressure was quite low on EMS arrival, and her symptoms improved with laying her flat.  I strongly suspect that this represents hypoperfusion event due to her low blood pressure, and would favor treating this underlying pathology.  No further  neurology specific workup is needed at  this time, please call with further questions or concerns.  Ritta Slot, MD [facility] [phone]  If 7pm- 7am, please page neurology on call as listed in AMION.

## 2023-07-29 LAB — URINE CULTURE

## 2023-08-01 ENCOUNTER — Ambulatory Visit: Payer: Medicare (Managed Care) | Attending: Cardiology | Admitting: Cardiology

## 2023-08-01 ENCOUNTER — Encounter: Payer: Self-pay | Admitting: Cardiology

## 2023-08-01 ENCOUNTER — Encounter: Payer: Self-pay | Admitting: *Deleted

## 2023-08-01 VITALS — BP 88/60 | HR 58 | Wt 142.0 lb

## 2023-08-01 DIAGNOSIS — I10 Essential (primary) hypertension: Secondary | ICD-10-CM

## 2023-08-01 DIAGNOSIS — I5032 Chronic diastolic (congestive) heart failure: Secondary | ICD-10-CM | POA: Diagnosis not present

## 2023-08-01 DIAGNOSIS — I480 Paroxysmal atrial fibrillation: Secondary | ICD-10-CM | POA: Diagnosis not present

## 2023-08-01 DIAGNOSIS — E7800 Pure hypercholesterolemia, unspecified: Secondary | ICD-10-CM

## 2023-08-01 MED ORDER — FUROSEMIDE 20 MG PO TABS: 20.0000 mg | ORAL_TABLET | Freq: Every day | ORAL | Status: DC | PRN

## 2023-08-01 NOTE — Patient Instructions (Addendum)
Medication Instructions:   STOP METOPROLOL  STOP JARDIANCE  STOP SPIRONOLACTONE  CHANGE FUROSEMIDE TO 20 MG ONCE DAILY AS NEEDED FOR SWELLING  *If you need a refill on your cardiac medications before your next appointment, please call your pharmacy   Follow-Up: At [facility], you and your health needs are our priority.  As part of our continuing mission to provide you with exceptional heart care, we have created designated Provider Care Teams.  These Care Teams include your primary Cardiologist (physician) and Advanced Practice Providers (APPs -  Physician Assistants and Nurse Practitioners) who all work together to provide you with the care you need, when you need it.  We recommend signing up for the patient portal called "MyChart".  Sign up information is provided on this After Visit Summary.  MyChart is used to connect with patients for Virtual Visits (Telemedicine).  Patients are able to view lab/test results, encounter notes, upcoming appointments, etc.  Non-urgent messages can be sent to your provider as well.   To learn more about what you can do with MyChart, go to [email or web address].    Your next appointment:   4 week(s)  Provider:   Marjie Skiff, PA-C, Joni Reining, DNP, ANP, Azalee Course, PA-C, Bernadene Person, NP, or Reather Littler, NP    Then, Olga Millers, MD will plan to see you again in 6 month(s).

## 2023-08-10 ENCOUNTER — Telehealth: Payer: Self-pay | Admitting: Cardiology

## 2023-08-10 DIAGNOSIS — R600 Localized edema: Secondary | ICD-10-CM

## 2023-08-10 DIAGNOSIS — I5032 Chronic diastolic (congestive) heart failure: Secondary | ICD-10-CM

## 2023-08-10 NOTE — Telephone Encounter (Signed)
 Attempted to call patient, no answer left message requesting a call back.

## 2023-08-10 NOTE — Telephone Encounter (Signed)
Pt c/o medication issue:  1. Name of Medication:   furosemide (LASIX) 20 MG tablet    2. How are you currently taking this medication (dosage and times per day)?   3. Are you having a reaction (difficulty breathing--STAT)? Yes   4. What is your medication issue? Patient's caregiver is requesting call back to discuss this medication. States this med is not controlling her swelling as much as the Jardiance was and would like a call back to see if patient can be started back on previous medication. Please advise.

## 2023-08-10 NOTE — Telephone Encounter (Signed)
Patient's caregiver is returning RN's call. Please advise.

## 2023-08-10 NOTE — Telephone Encounter (Signed)
Patient identification verified by 2 forms. Marilynn Rail, RN    Called and spoke to patients caregiver Selena Bush states:   -patient is taking Lasix 20mg  as needed   -patient took 20mg  yesterday and today   -order was changed to as needed per Dr. Jens Som   -today patients feet and ankles have increased swelling   -patient is wearing compression stockings daily   -would like to know if patient should be back on Jardiance since she has increased swelling  -BP is not checked at home  Informed [name] message sent to Dr. Jens Som for input/advisement  Selena Bush has no further questions at this time

## 2023-08-11 NOTE — Telephone Encounter (Signed)
Spoke with pt caregiver, she reports she has given the patient 20 mg of furosemide twice daily since christmas day. She reports the patient still has swelling. She also reports due to her dehydration she is giving her fluids per ER direction every 30 min. She is confined to a wheelchair and she sits with her feet on the floor.  She is wearing compression hose. She reports a very small change in edema from the hose. She is reporting she is eating good now. I tried to explain that she would need to limit her fluids but Selena Bush is afraid she is getting dehydrated again. She is asking for jardiance again. Explained that since she wears diapers and is incontinent the risk of infection is high. Aware will call [facility] to get them to draw lab work for [place] and will let dr Jens Som know the situation. Order faxed to [facility] for bmp and bnp at fax number [phone].

## 2023-08-11 NOTE — Telephone Encounter (Signed)
Spoke with pt caregiver, Aware of dr Ludwig Clarks recommendations.

## 2023-08-15 ENCOUNTER — Telehealth: Payer: Self-pay | Admitting: Cardiology

## 2023-08-15 NOTE — Telephone Encounter (Signed)
 Called and spoke to [place] who asked that lab orders be faxed to [facility]. Appears orders were faxed to [facility] 11/26. Orders re faxed to [facility] at  [phone].

## 2023-08-15 NOTE — Telephone Encounter (Signed)
Caller [facility]) stated lab orders were to be sent to [facility] and they have not received the lab orders as yet.  Caller wants order call in today as they will be closed tomorrow.

## 2023-08-16 LAB — LAB REPORT - SCANNED: EGFR: 61

## 2023-08-26 NOTE — Progress Notes (Deleted)
 Cardiology Office Note    Date:  08/26/2023  ID:  Selena Bush, DOB Jan 28, 1946, MRN [id number] PCP:  Alona Lamar SAILOR, MD  Cardiologist:  Redell Shallow, MD  Electrophysiologist:  None   Chief Complaint: ***  History of Present Illness: .    Selena Bush is a 78 y.o. female with visit-pertinent history of chronic diastolic heart failure, paroxysmal atrial fibrillation not on anticoagulation given history of hemorrhagic pericardial effusion, aortic stenosis, hypertension, hyperlipidemia, carotid artery stenosis and parkinsonism.  Selena Bush was hospitalized in March 2019 in the setting of atypical chest pain and paroxysmal atrial fibrillation.  Unfortunately she returned to the hospital significant dyspnea.  Follow-up echocardiogram showed large pericardial effusion with tamponade physiology, she underwent pericardiocentesis in April 2019.  Cytology was negative.  It was felt that her initial presentation was likely pericarditis and initiation of anticoagulation resulted in hemorrhagic pericardial effusion.  Anticoagulation was discontinued.  Echocardiogram in 09/2020 showed normal LV function, mild LVH, grade 2 diastolic dysfunction, moderate pulmonary hypertension, severe left atrial enlargement, mild aortic stenosis with mean gradient 10 mmHg.  Repeat echo in 11/2022 showed EF 65 to 70%, normal LV function, no RWMA, mild LVH, G1 DD, normal RV systolic function, no evidence of pericardial effusion and no evidence of aortic stenosis.  Carotid Dopplers per neurology revealed 1 to 39% bilateral ICA stenosis.  MRI in 12/2022 indicated multiple strokes, chronic infarcts in left frontal lobe and left parietal lobe as well as acute/early subacute and right precentral gyrus, suggestive of embolic etiology.    At follow-up visit in 02/2023 she was largely wheelchair-bound where she had previously been more mobile.  She was started on Plavix  75 mg daily.  Cardiac monitor in 02/2023 showed an average heart  rate of 54 bpm, ranging from 41 238 bpm.  Predominant underlying rhythm was sinus rhythm.  There was less than 1% A-fib burden, ranging from 95 to 130 bpm. She was started on Eliquis  5 mg twice daily and Plavix  was discontinued. An echo was ordered for monitoring given history of hemorrhagic pericardial effusion on Eliquis .  Echo on 03/21/2023 indicated LVEF of 65 to 70%, no RWMA, mild LVH of the basal septal segment, grade 2 diastolic dysfunction, there is aortic valve sclerosis without evidence of stenosis and no evidence of pericardial effusion.   On 07/28/2023 she presented to the emergency room with episode of altered mental status felt secondary to hypotension.  She saw Dr. Shallow in follow-up on 08/01/2023 regarding episodes of hypotension.  Her metoprolol  was discontinued as well as her Jardiance  and spironolactone .  Her Lasix  was decreased to 20 mg daily as needed.  Today she presents for follow-up.  She reports that she    Labwork independently reviewed:   ROS: .    Please see the history of present illness. Otherwise, review of systems is positive for ***.  All other systems are reviewed and otherwise negative.  Studies Reviewed: SABRA    EKG:  EKG is ordered today, personally reviewed, demonstrating ***  CV Studies: Cardiac studies reviewed are outlined and summarized above. Otherwise please see EMR for full report.   Current Reported Medications:.    No outpatient medications have been marked as taking for the 08/28/23 encounter (Appointment) with Harace Mccluney D, NP.    Physical Exam:    VS:  There were no vitals taken for this visit.   Wt Readings from Last 3 Encounters:  08/01/23 142 lb (64.4 kg)  07/28/23 150 lb 12.7 oz (  68.4 kg)  05/18/23 144 lb (65.3 kg)    GEN: Well nourished, well developed in no acute distress NECK: No JVD; No carotid bruits CARDIAC: ***RRR, no murmurs, rubs, gallops RESPIRATORY:  Clear to auscultation without rales, wheezing or rhonchi   ABDOMEN: Soft, non-tender, non-distended EXTREMITIES:  No edema; No acute deformity   Asessement and Plan:.     ***     Disposition: F/u with ***  Signed, Indiana Pechacek D Amory Simonetti, NP

## 2023-08-28 ENCOUNTER — Ambulatory Visit: Payer: Medicare (Managed Care) | Admitting: Cardiology

## 2023-08-28 DIAGNOSIS — I480 Paroxysmal atrial fibrillation: Secondary | ICD-10-CM

## 2023-08-28 DIAGNOSIS — I5032 Chronic diastolic (congestive) heart failure: Secondary | ICD-10-CM

## 2023-08-28 DIAGNOSIS — Z8673 Personal history of transient ischemic attack (TIA), and cerebral infarction without residual deficits: Secondary | ICD-10-CM

## 2023-08-28 DIAGNOSIS — I959 Hypotension, unspecified: Secondary | ICD-10-CM

## 2023-08-28 DIAGNOSIS — R600 Localized edema: Secondary | ICD-10-CM

## 2023-08-28 NOTE — Progress Notes (Signed)
Cardiology Office Note    Date:  09/01/2023  ID:  EMARY FRAGOSO, DOB 01/10/1946, MRN [id number] PCP:  Jethro Bastos, MD  Cardiologist:  Olga Millers, MD  Electrophysiologist:  None   Chief Complaint: Lower extremity edema   History of Present Illness: .    MADELL RATZEL is a 78 y.o. female with visit-pertinent history of chronic diastolic heart failure, paroxysmal atrial fibrillation not on anticoagulation given history of hemorrhagic pericardial effusion, aortic stenosis, hypertension, hyperlipidemia, carotid artery stenosis and parkinsonism.  Ms. Lytton was hospitalized in March 2019 in the setting of atypical chest pain and paroxysmal atrial fibrillation.  Unfortunately she returned to the hospital significant dyspnea.  Follow-up echocardiogram showed large pericardial effusion with tamponade physiology, she underwent pericardiocentesis in April 2019.  Cytology was negative.  It was felt that her initial presentation was likely pericarditis and initiation of anticoagulation resulted in hemorrhagic pericardial effusion.  Anticoagulation was discontinued.  Echocardiogram in 09/2020 showed normal LV function, mild LVH, grade 2 diastolic dysfunction, moderate pulmonary hypertension, severe left atrial enlargement, mild aortic stenosis with mean gradient 10 mmHg.  Repeat echo in 11/2022 showed EF 65 to 70%, normal LV function, no RWMA, mild LVH, G1 DD, normal RV systolic function, no evidence of pericardial effusion and no evidence of aortic stenosis.  Carotid Dopplers per neurology revealed 1 to 39% bilateral ICA stenosis.  MRI in 12/2022 indicated multiple strokes, chronic infarcts in left frontal lobe and left parietal lobe as well as acute/early subacute and right precentral gyrus, suggestive of embolic etiology.    At follow-up visit in 02/2023 she was largely wheelchair-bound where she had previously been more mobile.  She was started on Plavix 75 mg daily.  Cardiac monitor in 02/2023 showed  an average heart rate of 54 bpm, ranging from 41 238 bpm.  Predominant underlying rhythm was sinus rhythm.  There was less than 1% A-fib burden, ranging from 95 to 130 bpm. She was started on Eliquis 5 mg twice daily and Plavix was discontinued. An echo was ordered for monitoring given history of hemorrhagic pericardial effusion on Eliquis.  Echo on 03/21/2023 indicated LVEF of 65 to 70%, no RWMA, mild LVH of the basal septal segment, grade 2 diastolic dysfunction, there is aortic valve sclerosis without evidence of stenosis and no evidence of pericardial effusion.   On 07/28/2023 she presented to the emergency room with episode of altered mental status felt secondary to hypotension.  She saw Dr. Jens Som in follow-up on 08/01/2023 regarding episodes of hypotension.  Her metoprolol was discontinued as well as her Jardiance and spironolactone.  Her Lasix was decreased to 20 mg daily as needed.  Today she presents for follow-up.  She reports that she she is doing okay however she reports has had significant problems with lower extremity edema since discontinuation of Jardiance and spironolactone.  She reports that at [facility] her Lasix has been increased to 40 mg twice daily without significant improvement.  She is now wearing compression stockings regularly.  She denies chest pain but has noted some recent slight shortness of breath.  She denies any further problems with hypotension, she notes that she is now eating and drinking regularly and has not had problems with dehydration.  ROS: .   Today she denies chest pain, fatigue, palpitations, melena, hematuria, hemoptysis, diaphoresis, weakness, presyncope, syncope, orthopnea, and PND.  All other systems are reviewed and otherwise negative. Studies Reviewed: [name]    EKG:  EKG is not ordered today.  CV Studies:  Cardiac Studies & Procedures   CARDIAC CATHETERIZATION  CARDIAC CATHETERIZATION 11/13/2017  Narrative Conclusions: 1. Successful ultrasound and  fluoroscopic guided pericardiocentesis and pericardial drain placement (tube pericardiostomy) via the subxiphoid approach yielding 620 mL of blood fluid.  Recommendations: 1. Transfer to 2 Heart for pericardial drain management. 2. Obtain STAT CXR when patient arrives in ICU to confirm tube placement and exclude complications such as pneumothorax. 3. Keep drain to negative pressure using Doval device; record output every 4 hours. 4. Drain to be removed when less than 50 mL of output in 24 hours and no significant residual effusion on echo. 5. Hold all anticoagulants at this time.  Yvonne Kendall, MD [facility] Pager: [phone]    ECHOCARDIOGRAM  ECHOCARDIOGRAM COMPLETE 03/21/2023  Narrative ECHOCARDIOGRAM REPORT    Patient Name:   LESHELL GENGO Date of Exam: 03/21/2023 Medical Rec #:  [id number]       Height:       65.0 in Accession #:    [id number]      Weight:       144.0 lb Date of Birth:  1945-11-20       BSA:          1.720 m Patient Age:    76 years        BP:           98/76 mmHg Patient Gender: F               HR:           60 bpm. Exam Location:  Outpatient  Procedure: 2D Echo, 3D Echo, Cardiac Doppler, Color Doppler and Strain Analysis  Indications:    Endocarditis  History:        Patient has prior history of Echocardiogram examinations, most recent 11/15/2022. CHF, Arrythmias:Atrial Fibrillation; Risk Factors:Non-Smoker, Dyslipidemia and Hypertension. CKD; Pericardial Effusion with cardiac tamponade; Parkinson.  Sonographer:    Jeryl Columbia RDCS Referring Phys: [id number] CAITLIN Tilman Neat   Sonographer Comments: Limited mobility; echo performed in wheelchair due to patient being unable to move to bed. IMPRESSIONS   1. Left ventricular ejection fraction, by estimation, is 65 to 70%. The left ventricle has normal function. The left ventricle has no regional wall motion abnormalities. There is mild left ventricular hypertrophy of the basal-septal segment.  Left ventricular diastolic parameters are consistent with Grade II diastolic dysfunction (pseudonormalization). Elevated left ventricular end-diastolic pressure. 2. Right ventricular systolic function is normal. The right ventricular size is mildly enlarged. 3. Left atrial size was mildly dilated. 4. The mitral valve is degenerative. Trivial mitral valve regurgitation. No evidence of mitral stenosis. Severe mitral annular calcification. 5. The aortic valve is tricuspid. There is moderate calcification of the aortic valve. There is mild thickening of the aortic valve. Aortic valve regurgitation is not visualized. Aortic valve sclerosis/calcification is present, without any evidence of aortic stenosis.  FINDINGS Left Ventricle: Left ventricular ejection fraction, by estimation, is 65 to 70%. The left ventricle has normal function. The left ventricle has no regional wall motion abnormalities. The left ventricular internal cavity size was normal in size. There is mild left ventricular hypertrophy of the basal-septal segment. Left ventricular diastolic parameters are consistent with Grade II diastolic dysfunction (pseudonormalization). Elevated left ventricular end-diastolic pressure.  Right Ventricle: The right ventricular size is mildly enlarged. No increase in right ventricular wall thickness. Right ventricular systolic function is normal.  Left Atrium: Left atrial size was mildly dilated.  Right Atrium: Right atrial size  was normal in size.  Pericardium: There is no evidence of pericardial effusion.  Mitral Valve: The mitral valve is degenerative in appearance. There is mild thickening of the mitral valve leaflet(s). There is mild calcification of the mitral valve leaflet(s). Severe mitral annular calcification. Trivial mitral valve regurgitation. No evidence of mitral valve stenosis.  Tricuspid Valve: The tricuspid valve is normal in structure. Tricuspid valve regurgitation is trivial. No  evidence of tricuspid stenosis.  Aortic Valve: The aortic valve is tricuspid. There is moderate calcification of the aortic valve. There is mild thickening of the aortic valve. There is severe aortic valve annular calcification. Aortic valve regurgitation is not visualized. Aortic valve sclerosis/calcification is present, without any evidence of aortic stenosis.  Pulmonic Valve: The pulmonic valve was normal in structure. Pulmonic valve regurgitation is mild. No evidence of pulmonic stenosis.  Aorta: The aortic root is normal in size and structure.  Venous: The inferior vena cava was not well visualized.  IAS/Shunts: No atrial level shunt detected by color flow Doppler.   LEFT VENTRICLE PLAX 2D LVIDd:         4.97 cm   Diastology LVIDs:         2.64 cm   LV e' medial:    3.70 cm/s LV PW:         0.84 cm   LV E/e' medial:  16.0 LV IVS:        1.16 cm   LV e' lateral:   4.03 cm/s LVOT diam:     1.80 cm   LV E/e' lateral: 14.7 LV SV:         60 LV SV Index:   35 LVOT Area:     2.54 cm   RIGHT VENTRICLE RV Basal diam:  4.21 cm RV Mid diam:    4.26 cm RV S prime:     12.60 cm/s TAPSE (M-mode): 2.1 cm  LEFT ATRIUM             Index        RIGHT ATRIUM          Index LA diam:        5.10 cm 2.96 cm/m   RA Area:     9.32 cm LA Vol (A2C):   62.2 ml 36.16 ml/m  RA Volume:   18.50 ml 10.75 ml/m LA Vol (A4C):   52.8 ml 30.69 ml/m LA Biplane Vol: 63.7 ml 37.03 ml/m AORTIC VALVE LVOT Vmax:   95.20 cm/s LVOT Vmean:  59.900 cm/s LVOT VTI:    0.236 m  AORTA Ao Root diam: 3.20 cm Ao Asc diam:  3.40 cm  MITRAL VALVE               TRICUSPID VALVE MV Area (PHT): 3.48 cm    TR Peak grad:   16.8 mmHg MV Decel Time: 218 msec    TR Vmax:        205.00 cm/s MV E velocity: 59.10 cm/s MV A velocity: 68.10 cm/s  SHUNTS MV E/A ratio:  0.87        Systemic VTI:  0.24 m Systemic Diam: 1.80 cm  Armanda Magic MD Electronically signed by Armanda Magic MD Signature Date/Time:  03/21/2023/7:09:31 PM    Final   MONITORS  LONG TERM MONITOR (3-14 DAYS) 03/07/2023  Narrative Patch Wear Time:  13 days and 19 hours (2024-07-02T12:18:33-0400 to 2024-07-16T07:52:57-0400)  Patient had a min HR of 41 bpm, max HR of 138 bpm, and avg HR of 54 bpm.  Predominant underlying rhythm was Sinus Rhythm. 3 Supraventricular Tachycardia runs occurred, the run with the fastest interval lasting 7 beats with a max rate of 138 bpm, the longest lasting 9 beats with an avg rate of 94 bpm. Atrial Fibrillation occurred (<1% burden), ranging from 95-130 bpm (avg of 115 bpm), the longest lasting 1 min 10 secs with an avg rate of 115 bpm. Isolated SVEs were rare (<1.0%), SVE Couplets were rare (<1.0%), and SVE Triplets were rare (<1.0%). Isolated VEs were rare (<1.0%), VE Couplets were rare (<1.0%), and no VE Triplets were present. Ventricular Bigeminy and Trigeminy were present.  Sinus bradycardia, normal sinus rhythm, occasional PAC, brief PAT, short episodes of paroxysmal atrial fibrillation, rare PVC and couplet. Olga Millers           Current Reported Medications:.    Current Meds  Medication Sig   acetaminophen (TYLENOL) 500 MG tablet Take 500 mg by mouth every 6 (six) hours as needed for moderate pain.   apixaban (ELIQUIS) 5 MG TABS tablet Take 1 tablet (5 mg total) by mouth 2 (two) times daily.   bisacodyl (DULCOLAX) 5 MG EC tablet Take 5 mg by mouth daily as needed for moderate constipation.   Carbidopa-Levodopa ER (RYTARY) 36.25-145 MG CPCR 3 capsules at 7am/11am/4pm and 1 capsule at bed (Patient taking differently: 3 capsules at 12 pm and 5 pm1 capsule at bed)   Cholecalciferol (VITAMIN D-3) 25 MCG (1000 UT) CAPS Take 1 capsule by mouth daily.   Cyanocobalamin (VITAMIN B-12) 5000 MCG SUBL Place 1 each under the tongue daily.   DULoxetine (CYMBALTA) 60 MG capsule TAKE ONE CAPSULE BY MOUTH TWICE DAILY FOR anxiety AND depression (Patient taking differently: Take 60 mg by mouth 2 (two)  times daily. TAKE ONE CAPSULE BY MOUTH TWICE DAILY FOR anxiety AND depression)   ezetimibe (ZETIA) 10 MG tablet TAKE ONE TABLET BY MOUTH EVERY MORNING (Patient taking differently: Take 10 mg by mouth daily.)   fluticasone (FLONASE) 50 MCG/ACT nasal spray Place 2 sprays into both nostrils daily as needed for rhinitis or allergies.   furosemide (LASIX) 20 MG tablet Take 1 tablet (20 mg total) by mouth daily as needed.   gabapentin (NEURONTIN) 600 MG tablet Take 600 mg by mouth as needed.   Melatonin 5 MG CHEW Chew 10 mg by mouth at bedtime.   Multiple Vitamins-Minerals (CEROVITE SENIOR PO) Take 1 tablet by mouth daily.   NONFORMULARY OR COMPOUNDED ITEM Dispense 1 custom manual wheelchair evaluation for improved positioning and pressure distribution. G20.A1   oxybutynin (DITROPAN) 5 MG tablet Take 5 mg by mouth 2 (two) times daily.   sotalol (BETAPACE) 120 MG tablet TAKE ONE TABLET BY MOUTH TWICE DAILY (Patient taking differently: Take 120 mg by mouth 2 (two) times daily.)   tiZANidine (ZANAFLEX) 4 MG tablet TAKE 2 TABLET (4 MG TOTAL) BY MOUTH DAILY FOR MUSCLE SPASMS AT BEDTIME. (Patient taking differently: Take 4 mg by mouth in the morning and at bedtime.)   torsemide (DEMADEX) 20 MG tablet Take 1 tablet (20 mg total) by mouth daily. Take 2 tablets for the next 3 days then go back down to 1 tablet a day    Physical Exam:    VS:  BP 110/67 (BP Location: Left Arm, Patient Position: Sitting, Cuff Size: Normal) Comment: AOTOMATIC BP MEASUREMENT.  Pulse (!) 54   Ht 5\' 2"  (1.575 m)   Wt 142 lb (64.4 kg)   SpO2 94%   BMI 25.97 kg/m    Wt Readings  from Last 3 Encounters:  09/01/23 142 lb (64.4 kg)  08/01/23 142 lb (64.4 kg)  07/28/23 150 lb 12.7 oz (68.4 kg)    GEN: Well nourished, well developed in no acute distress NECK: No JVD; No carotid bruits CARDIAC: RRR, no murmurs, rubs, gallops RESPIRATORY:  Clear to auscultation without rales, wheezing or rhonchi  ABDOMEN: Soft, non-tender,  non-distended EXTREMITIES:  +2 bilateral lower extremity edema; No acute deformity   Asessement and Plan:[name]    Hypotension: Blood pressure today 110/67. At last office visit patient's Jardiance, spironolactone, metoprolol were discontinued.  Her Lasix was decreased to 20 mg daily as needed. Patient reports that her blood pressure has been normal.  She has been requiring Lasix 40 mg twice daily for many weeks, she notes since her Jardiance and spironolactone was discontinued.  Encouraged to monitor blood pressure.  She reports that she is intentionally staying well-hydrated and is trying to eat small frequent meals.  Chronic diastolic HF/lower extremity edema: Patient with history of diastolic heart failure.  Given frequent hypotension and altered mental status her metoprolol, Jardiance and spironolactone were discontinued in December.  Patient reports that she is now requiring Lasix 40 mg twice daily with no significant improvement in her lower extremity edema, now with some minor shortness of breath.  She does not feel that Lasix is working well for her anymore.  Notes that her blood pressure has been stable.  She is closely monitored at pace  Will stop Lasix and start torsemide, she is to take 40 mg for 3 days then decrease to 20 mg daily. Check BMET and magnesium level today, repeat in two weeks.  Patient with urinary incontinence and frequent UTIs, SGLT2 inhibitor contraindicated.  Paroxysmal Afib/CVA: She has history of atrial fibrillation in the setting of pericarditis and pneumonia but also had episode noted on cardiac monitor in 02/2023.  Per Dr. Jens Som given history of CVA she requires long-term anticoagulation.  Her metoprolol was discontinued in December given hypotension. Heart rate today 54, on recheck was 62 bpm.  She denies any problems of presyncope or syncope, increased fatigue.  She has been continued on sotalol.  Will check be met and magnesium level today.  She denies bleeding problems  on Eliquis.  Hyperlipidemia: Continue Zetia, patient with a history of statin intolerance.    Disposition: F/u with Reather Littler, NP in four weeks.   Signed, Rip Harbour, NP

## 2023-09-01 ENCOUNTER — Encounter: Payer: Self-pay | Admitting: Cardiology

## 2023-09-01 ENCOUNTER — Ambulatory Visit: Payer: Medicare (Managed Care) | Attending: Cardiology | Admitting: Cardiology

## 2023-09-01 VITALS — BP 110/67 | HR 54 | Ht 62.0 in | Wt 142.0 lb

## 2023-09-01 DIAGNOSIS — I480 Paroxysmal atrial fibrillation: Secondary | ICD-10-CM | POA: Diagnosis not present

## 2023-09-01 DIAGNOSIS — R600 Localized edema: Secondary | ICD-10-CM

## 2023-09-01 DIAGNOSIS — I959 Hypotension, unspecified: Secondary | ICD-10-CM

## 2023-09-01 DIAGNOSIS — I5032 Chronic diastolic (congestive) heart failure: Secondary | ICD-10-CM

## 2023-09-01 MED ORDER — TORSEMIDE 20 MG PO TABS: 20.0000 mg | ORAL_TABLET | Freq: Every day | ORAL | 3 refills | Status: DC

## 2023-09-01 NOTE — Patient Instructions (Signed)
Medication Instructions:  Stop Lasix Start Torsemide 20 mg take 2 tablets once a day for the next 3 days after take 1 tablet once a day. *If you need a refill on your cardiac medications before your next appointment, please call your pharmacy*  Lab Work: Today we are going to draw a Bmet and Mag In 2 weeks we will need to redraw a Bmet and Mag If you have labs (blood work) drawn today and your tests are completely normal, you will receive your results only by: MyChart Message (if you have MyChart) OR A paper copy in the mail If you have any lab test that is abnormal or we need to change your treatment, we will call you to review the results.  Testing/Procedures: No testing  Follow-Up: At [facility], you and your health needs are our priority.  As part of our continuing mission to provide you with exceptional heart care, we have created designated Provider Care Teams.  These Care Teams include your primary Cardiologist (physician) and Advanced Practice Providers (APPs -  Physician Assistants and Nurse Practitioners) who all work together to provide you with the care you need, when you need it.  We recommend signing up for the patient portal called "MyChart".  Sign up information is provided on this After Visit Summary.  MyChart is used to connect with patients for Virtual Visits (Telemedicine).  Patients are able to view lab/test results, encounter notes, upcoming appointments, etc.  Non-urgent messages can be sent to your provider as well.   To learn more about what you can do with MyChart, go to [email or web address].    Your next appointment:   4 week(s)  Provider:   Reather Littler, NP   Other Instructions

## 2023-09-02 LAB — BASIC METABOLIC PANEL
BUN/Creatinine Ratio: 19 (ref 12–28)
BUN: 16 mg/dL (ref 8–27)
CO2: 28 mmol/L (ref 20–29)
Calcium: 10.5 mg/dL — ABNORMAL HIGH (ref 8.7–10.3)
Chloride: 97 mmol/L (ref 96–106)
Creatinine, Ser: 0.83 mg/dL (ref 0.57–1.00)
Glucose: 82 mg/dL (ref 70–99)
Potassium: 4.1 mmol/L (ref 3.5–5.2)
Sodium: 143 mmol/L (ref 134–144)
eGFR: 73 mL/min/{1.73_m2} (ref >59)

## 2023-09-02 LAB — MAGNESIUM: Magnesium: 2.2 mg/dL (ref 1.6–2.3)

## 2023-09-05 ENCOUNTER — Other Ambulatory Visit: Payer: Self-pay | Admitting: Vascular Surgery

## 2023-09-05 ENCOUNTER — Encounter: Payer: Self-pay | Admitting: Vascular Surgery

## 2023-09-05 ENCOUNTER — Telehealth: Payer: Self-pay

## 2023-09-05 ENCOUNTER — Ambulatory Visit
Admission: RE | Admit: 2023-09-05 | Discharge: 2023-09-05 | Disposition: A | Discharge status: Home / Self Care | Payer: Medicare (Managed Care) | Source: Ambulatory Visit | Attending: Vascular Surgery | Admitting: Vascular Surgery

## 2023-09-05 DIAGNOSIS — R059 Cough, unspecified: Secondary | ICD-10-CM

## 2023-09-05 NOTE — Telephone Encounter (Signed)
Left message to call back  

## 2023-09-05 NOTE — Telephone Encounter (Signed)
-----   Message from Rip Harbour sent at 09/02/2023  8:27 PM EST ----- Please let Ms. Venditti know that her kidney function and electrolytes are normal. Continue medications as discussed and follow up as planned.

## 2023-09-08 ENCOUNTER — Encounter (HOSPITAL_BASED_OUTPATIENT_CLINIC_OR_DEPARTMENT_OTHER): Payer: Self-pay

## 2023-09-23 LAB — BASIC METABOLIC PANEL: EGFR: 76

## 2023-09-27 ENCOUNTER — Ambulatory Visit: Payer: Medicare (Managed Care) | Admitting: Cardiology

## 2023-09-28 ENCOUNTER — Ambulatory Visit (INDEPENDENT_AMBULATORY_CARE_PROVIDER_SITE_OTHER): Payer: Medicare (Managed Care) | Admitting: Podiatry

## 2023-09-28 ENCOUNTER — Encounter: Payer: Self-pay | Admitting: Podiatry

## 2023-09-28 VITALS — Ht 62.0 in

## 2023-09-28 DIAGNOSIS — B351 Tinea unguium: Secondary | ICD-10-CM | POA: Diagnosis not present

## 2023-09-28 DIAGNOSIS — I739 Peripheral vascular disease, unspecified: Secondary | ICD-10-CM

## 2023-09-28 DIAGNOSIS — M79674 Pain in right toe(s): Secondary | ICD-10-CM

## 2023-09-28 DIAGNOSIS — M79675 Pain in left toe(s): Secondary | ICD-10-CM

## 2023-09-28 NOTE — Progress Notes (Signed)
  Subjective:  Patient ID: Selena Bush, female    DOB: 11/28/1945,  MRN: [id number]  Chief Complaint  Patient presents with   RFC    She is here for a nail trim,     78 y.o. female presents with the above complaint. History confirmed with patient. Patient presenting with pain related to dystrophic thickened elongated nails. Patient is unable to trim own nails related to nail dystrophy and/or mobility issues. Patient does not have a history of T2DM.    Objective:  Physical Exam: warm, good capillary refill nail exam onychomycosis of the toenails, onycholysis, and dystrophic nails protective sensation intact decreased pedal pulses bilaterally Left Foot:  Pain with palpation of nails due to elongation and dystrophic growth.  Right Foot: Pain with palpation of nails due to elongation and dystrophic growth.   Patient is wheelchair-bound Assessment:   1. Pain due to onychomycosis of toenails of both feet   2. PVD (peripheral vascular disease) (HCC)      Plan:  Patient was evaluated and treated and all questions answered.  #Onychomycosis with pain  -Nails palliatively debrided as below. -Educated on self-care  Procedure: Nail Debridement Rationale: Pain Type of Debridement: manual, sharp debridement. Instrumentation: Nail nipper, rotary burr. Number of Nails: 10  Return in about 3 months (around 12/26/2023) for Routine Foot Care.         Barbaraann Share, DPM [facility] ([facility]

## 2023-10-04 NOTE — Progress Notes (Signed)
 Cardiology Office Note    Date:  10/07/2023  ID:  Selena Bush, Selena Bush 07/17/1946, MRN [id number] PCP:  Jethro Bastos, MD  Cardiologist:  Olga Millers, MD  Electrophysiologist:  None   Chief Complaint: Follow up for chronic diastolic HF   History of Present Illness: .    Selena Bush is a 78 y.o. female with visit-pertinent history of chronic diastolic heart failure, paroxysmal atrial fibrillation not on anticoagulation given history of hemorrhagic pericardial effusion, aortic stenosis, hypertension, hyperlipidemia, carotid artery stenosis and parkinsonism.  [name] was hospitalized in March 2019 in the setting of atypical chest pain and paroxysmal atrial fibrillation.  Unfortunately she returned to the hospital significant dyspnea.  Follow-up echocardiogram showed large pericardial effusion with tamponade physiology, she underwent pericardiocentesis in April 2019.  Cytology was negative.  It was felt that her initial presentation was likely pericarditis and initiation of anticoagulation resulted in hemorrhagic pericardial effusion.  Anticoagulation was discontinued.  Echocardiogram in 09/2020 showed normal LV function, mild LVH, grade 2 diastolic dysfunction, moderate pulmonary hypertension, severe left atrial enlargement, mild aortic stenosis with mean gradient 10 mmHg.  Repeat echo in 11/2022 showed EF 65 to 70%, normal LV function, no RWMA, mild LVH, G1 DD, normal RV systolic function, no evidence of pericardial effusion and no evidence of aortic stenosis.  Carotid Dopplers per neurology revealed 1 to 39% bilateral ICA stenosis.  MRI in 12/2022 indicated multiple strokes, chronic infarcts in left frontal lobe and left parietal lobe as well as acute/early subacute and right precentral gyrus, suggestive of embolic etiology.    At follow-up visit in 02/2023 she was largely wheelchair-bound where she had previously been more mobile.  She was started on Plavix 75 mg daily.  Cardiac monitor in  02/2023 showed an average heart rate of 54 bpm, ranging from 41 238 bpm.  Predominant underlying rhythm was sinus rhythm.  There was less than 1% A-fib burden, ranging from 95 to 130 bpm. She was started on Eliquis 5 mg twice daily and Plavix was discontinued. An echo was ordered for monitoring given history of hemorrhagic pericardial effusion on Eliquis.  Echo on 03/21/2023 indicated LVEF of 65 to 70%, no RWMA, mild LVH of the basal septal segment, grade 2 diastolic dysfunction, there is aortic valve sclerosis without evidence of stenosis and no evidence of pericardial effusion.   On 07/28/2023 she presented to the emergency room with episode of altered mental status felt secondary to hypotension.  She saw Dr. Jens Som in follow-up on 08/01/2023 regarding episodes of hypotension.  Her metoprolol was discontinued as well as her Jardiance and spironolactone.  Her Lasix was decreased to 20 mg daily as needed.  On 09/01/2023 she presented for follow-up.  She reported that she was doing okay however she noted she had significant problems with lower extremity edema since discontinuation of Jardiance and spironolactone.  She reported that at pace her Lasix had been increased to 40 mg twice daily without significant improvement.  She was wearing compression stockings regularly.  Her Lasix was stopped and she was started on torsemide 40 mg daily for 3 days followed by torsemide 20 mg daily thereafter.   She presents today with her caregiver and friend, [name], they note that her lower extremity edema had significantly improved with Torsemide 40 mg daily, when she decreased back down to 20 mg daily she started having increased lower extremity edema.  [name] notes that [name] is not always good at staying well-hydrated.  Recently they have  been able to get her to drink things such as body armor, tea, watermelon juice as patient does not care for water.  She regularly stays at pace and will typically inform them that she  is not thirsty, they have been working to reinforce that she should try to drink throughout the day.  [name] notes that she recently was again treated for a UTI and was found to have an abscess at her labia. She has regularly been on antibiotics the last month, which has decreased her appetite, has been improving recently.  They report that she is compliant with her compression stockings however does not always elevate her legs.  Patient reports that her breathing is at baseline, she denies any chest pain, orthopnea or PND.  Labwork independently reviewed: 09/22/2023: Creatinine 0.81, potassium 3.9, sodium 139  ROS: .   Today she denies chest pain, shortness of breath, fatigue, palpitations, melena, hematuria, hemoptysis, diaphoresis, weakness, presyncope, syncope, orthopnea, and PND.  All other systems are reviewed and otherwise negative. Studies Reviewed: [name]    EKG:  EKG is not ordered today.   CV Studies:  Cardiac Studies & Procedures   ______________________________________________________________________________________________ CARDIAC CATHETERIZATION  CARDIAC CATHETERIZATION 11/13/2017  Narrative Conclusions: 1. Successful ultrasound and fluoroscopic guided pericardiocentesis and pericardial drain placement (tube pericardiostomy) via the subxiphoid approach yielding 620 mL of blood fluid.  Recommendations: 1. Transfer to 2 Heart for pericardial drain management. 2. Obtain STAT CXR when patient arrives in ICU to confirm tube placement and exclude complications such as pneumothorax. 3. Keep drain to negative pressure using Doval device; record output every 4 hours. 4. Drain to be removed when less than 50 mL of output in 24 hours and no significant residual effusion on echo. 5. Hold all anticoagulants at this time.  Yvonne Kendall, MD [facility] Pager: [phone]     ECHOCARDIOGRAM  ECHOCARDIOGRAM COMPLETE 03/21/2023  Narrative ECHOCARDIOGRAM REPORT    Patient Name:    Selena Bush Date of Exam: 03/21/2023 Medical Rec #:  [id number]       Height:       65.0 in Accession #:    [id number]      Weight:       144.0 lb Date of Birth:  [birth year]       BSA:          1.720 m Patient Age:    76 years        BP:           98/76 mmHg Patient Gender: F               HR:           60 bpm. Exam Location:  Outpatient  Procedure: 2D Echo, 3D Echo, Cardiac Doppler, Color Doppler and Strain Analysis  Indications:    Endocarditis  History:        Patient has prior history of Echocardiogram examinations, most recent 11/15/2022. CHF, Arrythmias:Atrial Fibrillation; Risk Factors:Non-Smoker, Dyslipidemia and Hypertension. CKD; Pericardial Effusion with cardiac tamponade; Parkinson.  Sonographer:    Jeryl Columbia RDCS Referring Phys: [id number] CAITLIN Tilman Neat   Sonographer Comments: Limited mobility; echo performed in wheelchair due to patient being unable to move to bed. IMPRESSIONS   1. Left ventricular ejection fraction, by estimation, is 65 to 70%. The left ventricle has normal function. The left ventricle has no regional wall motion abnormalities. There is mild left ventricular hypertrophy of the basal-septal segment. Left ventricular diastolic parameters are consistent with Grade II diastolic  dysfunction (pseudonormalization). Elevated left ventricular end-diastolic pressure. 2. Right ventricular systolic function is normal. The right ventricular size is mildly enlarged. 3. Left atrial size was mildly dilated. 4. The mitral valve is degenerative. Trivial mitral valve regurgitation. No evidence of mitral stenosis. Severe mitral annular calcification. 5. The aortic valve is tricuspid. There is moderate calcification of the aortic valve. There is mild thickening of the aortic valve. Aortic valve regurgitation is not visualized. Aortic valve sclerosis/calcification is present, without any evidence of aortic stenosis.  FINDINGS Left Ventricle: Left ventricular  ejection fraction, by estimation, is 65 to 70%. The left ventricle has normal function. The left ventricle has no regional wall motion abnormalities. The left ventricular internal cavity size was normal in size. There is mild left ventricular hypertrophy of the basal-septal segment. Left ventricular diastolic parameters are consistent with Grade II diastolic dysfunction (pseudonormalization). Elevated left ventricular end-diastolic pressure.  Right Ventricle: The right ventricular size is mildly enlarged. No increase in right ventricular wall thickness. Right ventricular systolic function is normal.  Left Atrium: Left atrial size was mildly dilated.  Right Atrium: Right atrial size was normal in size.  Pericardium: There is no evidence of pericardial effusion.  Mitral Valve: The mitral valve is degenerative in appearance. There is mild thickening of the mitral valve leaflet(s). There is mild calcification of the mitral valve leaflet(s). Severe mitral annular calcification. Trivial mitral valve regurgitation. No evidence of mitral valve stenosis.  Tricuspid Valve: The tricuspid valve is normal in structure. Tricuspid valve regurgitation is trivial. No evidence of tricuspid stenosis.  Aortic Valve: The aortic valve is tricuspid. There is moderate calcification of the aortic valve. There is mild thickening of the aortic valve. There is severe aortic valve annular calcification. Aortic valve regurgitation is not visualized. Aortic valve sclerosis/calcification is present, without any evidence of aortic stenosis.  Pulmonic Valve: The pulmonic valve was normal in structure. Pulmonic valve regurgitation is mild. No evidence of pulmonic stenosis.  Aorta: The aortic root is normal in size and structure.  Venous: The inferior vena cava was not well visualized.  IAS/Shunts: No atrial level shunt detected by color flow Doppler.   LEFT VENTRICLE PLAX 2D LVIDd:         4.97 cm   Diastology LVIDs:          2.64 cm   LV e' medial:    3.70 cm/s LV PW:         0.84 cm   LV E/e' medial:  16.0 LV IVS:        1.16 cm   LV e' lateral:   4.03 cm/s LVOT diam:     1.80 cm   LV E/e' lateral: 14.7 LV SV:         60 LV SV Index:   35 LVOT Area:     2.54 cm   RIGHT VENTRICLE RV Basal diam:  4.21 cm RV Mid diam:    4.26 cm RV S prime:     12.60 cm/s TAPSE (M-mode): 2.1 cm  LEFT ATRIUM             Index        RIGHT ATRIUM          Index LA diam:        5.10 cm 2.96 cm/m   RA Area:     9.32 cm LA Vol (A2C):   62.2 ml 36.16 ml/m  RA Volume:   18.50 ml 10.75 ml/m LA Vol (A4C):   52.8 ml  30.69 ml/m LA Biplane Vol: 63.7 ml 37.03 ml/m AORTIC VALVE LVOT Vmax:   95.20 cm/s LVOT Vmean:  59.900 cm/s LVOT VTI:    0.236 m  AORTA Ao Root diam: 3.20 cm Ao Asc diam:  3.40 cm  MITRAL VALVE               TRICUSPID VALVE MV Area (PHT): 3.48 cm    TR Peak grad:   16.8 mmHg MV Decel Time: 218 msec    TR Vmax:        205.00 cm/s MV E velocity: 59.10 cm/s MV A velocity: 68.10 cm/s  SHUNTS MV E/A ratio:  0.87        Systemic VTI:  0.24 m Systemic Diam: 1.80 cm  Armanda Magic MD Electronically signed by Armanda Magic MD Signature Date/Time: 03/21/2023/7:09:31 PM    Final    MONITORS  LONG TERM MONITOR (3-14 DAYS) 03/07/2023  Narrative Patch Wear Time:  13 days and 19 hours (2024-07-02T12:18:33-0400 to 2024-07-16T07:52:57-0400)  Patient had a min HR of 41 bpm, max HR of 138 bpm, and avg HR of 54 bpm. Predominant underlying rhythm was Sinus Rhythm. 3 Supraventricular Tachycardia runs occurred, the run with the fastest interval lasting 7 beats with a max rate of 138 bpm, the longest lasting 9 beats with an avg rate of 94 bpm. Atrial Fibrillation occurred (<1% burden), ranging from 95-130 bpm (avg of 115 bpm), the longest lasting 1 min 10 secs with an avg rate of 115 bpm. Isolated SVEs were rare (<1.0%), SVE Couplets were rare (<1.0%), and SVE Triplets were rare (<1.0%). Isolated VEs were rare  (<1.0%), VE Couplets were rare (<1.0%), and no VE Triplets were present. Ventricular Bigeminy and Trigeminy were present.  Sinus bradycardia, normal sinus rhythm, occasional PAC, brief PAT, short episodes of paroxysmal atrial fibrillation, rare PVC and couplet. Olga Millers       ______________________________________________________________________________________________       Current Reported Medications:.    Current Meds  Medication Sig   acetaminophen (TYLENOL) 500 MG tablet Take 500 mg by mouth every 6 (six) hours as needed for moderate pain.   apixaban (ELIQUIS) 5 MG TABS tablet Take 1 tablet (5 mg total) by mouth 2 (two) times daily.   bisacodyl (DULCOLAX) 5 MG EC tablet Take 5 mg by mouth daily as needed for moderate constipation.   Carbidopa-Levodopa ER (RYTARY) 36.25-145 MG CPCR 3 capsules at 7am/11am/4pm and 1 capsule at bed (Patient taking differently: 3 capsules at 12 pm and 5 pm1 capsule at bed)   Cholecalciferol (VITAMIN D-3) 25 MCG (1000 UT) CAPS Take 1 capsule by mouth daily.   Cyanocobalamin (VITAMIN B-12) 5000 MCG SUBL Place 1 each under the tongue daily.   DULoxetine (CYMBALTA) 60 MG capsule TAKE ONE CAPSULE BY MOUTH TWICE DAILY FOR anxiety AND depression (Patient taking differently: Take 60 mg by mouth 2 (two) times daily. TAKE ONE CAPSULE BY MOUTH TWICE DAILY FOR anxiety AND depression)   ezetimibe (ZETIA) 10 MG tablet TAKE ONE TABLET BY MOUTH EVERY MORNING (Patient taking differently: Take 10 mg by mouth daily.)   fluticasone (FLONASE) 50 MCG/ACT nasal spray Place 2 sprays into both nostrils daily as needed for rhinitis or allergies.   gabapentin (NEURONTIN) 600 MG tablet Take 600 mg by mouth as needed.   Melatonin 5 MG CHEW Chew 10 mg by mouth at bedtime.   Multiple Vitamins-Minerals (CEROVITE SENIOR PO) Take 1 tablet by mouth daily.   NONFORMULARY OR COMPOUNDED ITEM Dispense 1 custom manual wheelchair  evaluation for improved positioning and pressure  distribution. G20.A1   oxybutynin (DITROPAN) 5 MG tablet Take 5 mg by mouth 2 (two) times daily.   sotalol (BETAPACE) 120 MG tablet TAKE ONE TABLET BY MOUTH TWICE DAILY (Patient taking differently: Take 120 mg by mouth 2 (two) times daily.)   [DISCONTINUED] furosemide (LASIX) 20 MG tablet Take 1 tablet (20 mg total) by mouth daily as needed.   [DISCONTINUED] torsemide (DEMADEX) 20 MG tablet Take 1 tablet (20 mg total) by mouth daily. Take 2 tablets for the next 3 days then go back down to 1 tablet a day    Physical Exam:    VS:  BP 116/72   Pulse (!) 56   Ht 5\' 2"  (1.575 m)   Wt 144 lb (65.3 kg)   SpO2 95%   BMI 26.34 kg/m    Wt Readings from Last 3 Encounters:  10/06/23 144 lb (65.3 kg)  09/01/23 142 lb (64.4 kg)  08/01/23 142 lb (64.4 kg)    GEN: Well nourished, well developed in no acute distress NECK: No JVD; No carotid bruits CARDIAC: RRR, no murmurs, rubs, gallops RESPIRATORY:  Clear to auscultation without rales, wheezing or rhonchi  ABDOMEN: Soft, non-tender, non-distended EXTREMITIES:  +1 bilateral edema to ankles; No acute deformity   Asessement and Plan:.    Chronic diastolic HF/Lower extremity edema: Patient with history of diastolic heart failure.  In December patient's Jardiance, metoprolol and spironolactone were discontinued given frequent hypotension and altered mental status.  At office visit on 1/17 patient noted increased lower extremity edema and had been requiring 40 mg Lasix twice daily for many weeks.  She felt that Lasix was no longer working very well for her.  She was transitioned from Lasix to torsemide.  Today patient reports that she is doing well, her and her caregiver notes that after transitioning to torsemide 40 mg daily she had significant improvement in her lower extremity edema however after transitioning back down to 20 mg daily she started having increased edema.  Today she has bilateral +1 edema to the ankles.  Stressed importance of trying to  elevate the lower extremities and reducing salt intake.  She is compliant with compression stockings.  Patient will take torsemide 40 mg daily for 3 days then return to 20 mg daily.  Instructed she can take 1 additional 20 mg tablet for increased lower extremity edema, orthopnea or PND.  Check BMET in 2 weeks. Patient with history of urinary continence and frequent UTIs, SGLT2 inhibitor contraindicated.  Hypotension: Blood pressure today 116/72.  Encouraged patient to continue working to stay hydrated.  She is closely monitored by providers at pace.  Paroxysmal afib/CVA: Patient has history of atrial fibrillation in the setting of pericarditis and pneumonia but also had episode noted on cardiac monitor in 02/2023.  Per Dr. Jens Som given history of CVA she requires long-term anticoagulation.  In December her metoprolol was discontinued in setting of hypotension. Today she denies any presyncope or syncope, increased fatigue. She has been continued on sotalol. She denies any bleeding problems on Eliquis.  Hyperlipidemia: Patient with a history of statin intolerance, continue Zetia.  Parkinsons Disorder: Continue to monitor for symptoms of orthostatic hypotension.  Blood pressure has appeared to be stable.  Patient is now overall wheelchair-bound.  Attends pace.    Disposition: F/u with Reather Littler, NP in 6-8 weeks and Dr. Jens Som in 4 months.   Signed, Rip Harbour, NP

## 2023-10-06 ENCOUNTER — Encounter: Payer: Self-pay | Admitting: Cardiology

## 2023-10-06 ENCOUNTER — Ambulatory Visit: Payer: Medicare (Managed Care) | Attending: Cardiology | Admitting: Cardiology

## 2023-10-06 VITALS — BP 116/72 | HR 56 | Ht 62.0 in | Wt 144.0 lb

## 2023-10-06 DIAGNOSIS — I959 Hypotension, unspecified: Secondary | ICD-10-CM | POA: Diagnosis not present

## 2023-10-06 DIAGNOSIS — I5032 Chronic diastolic (congestive) heart failure: Secondary | ICD-10-CM | POA: Diagnosis not present

## 2023-10-06 DIAGNOSIS — Z8673 Personal history of transient ischemic attack (TIA), and cerebral infarction without residual deficits: Secondary | ICD-10-CM

## 2023-10-06 DIAGNOSIS — I480 Paroxysmal atrial fibrillation: Secondary | ICD-10-CM | POA: Diagnosis not present

## 2023-10-06 DIAGNOSIS — R600 Localized edema: Secondary | ICD-10-CM

## 2023-10-06 DIAGNOSIS — G20C Parkinsonism, unspecified: Secondary | ICD-10-CM

## 2023-10-06 MED ORDER — TORSEMIDE 20 MG PO TABS: 20.0000 mg | ORAL_TABLET | Freq: Every day | ORAL | 3 refills | Status: DC

## 2023-10-06 MED ORDER — TORSEMIDE 20 MG PO TABS: ORAL_TABLET | ORAL | 3 refills | Status: DC

## 2023-10-06 NOTE — Patient Instructions (Signed)
 Medication Instructions:  Toresemide 20 mg take 2 tablets for the next 3 days then go back down to 1 tablet a day. You can take an additional dosage of the 20 mg tablet for lower extremity swelling, shortness of breath, a weight gain of 3 lbs overnight and 5 lbs over a week. *If you need a refill on your cardiac medications before your next appointment, please call your pharmacy*  Lab Work: In 2 weeks we are going to needs a non fasting BMP drawn. If you have labs (blood work) drawn today and your tests are completely normal, you will receive your results only by: MyChart Message (if you have MyChart) OR A paper copy in the mail If you have any lab test that is abnormal or we need to change your treatment, we will call you to review the results.  Testing/Procedures: No testing  Follow-Up: At [facility], you and your health needs are our priority.  As part of our continuing mission to provide you with exceptional heart care, we have created designated Provider Care Teams.  These Care Teams include your primary Cardiologist (physician) and Advanced Practice Providers (APPs -  Physician Assistants and Nurse Practitioners) who all work together to provide you with the care you need, when you need it.  We recommend signing up for the patient portal called "MyChart".  Sign up information is provided on this After Visit Summary.  MyChart is used to connect with patients for Virtual Visits (Telemedicine).  Patients are able to view lab/test results, encounter notes, upcoming appointments, etc.  Non-urgent messages can be sent to your provider as well.   To learn more about what you can do with MyChart, go to [email or web address].    Your next appointment:   6 week(s)  Provider:   Reather Littler, NP Then, Olga Millers, MD will plan to see you again in 4 month(s).    Other Instructions

## 2023-10-07 ENCOUNTER — Encounter: Payer: Self-pay | Admitting: Cardiology

## 2023-10-18 LAB — LAB REPORT - SCANNED: EGFR: 64

## 2023-10-26 NOTE — Progress Notes (Signed)
 Assessment/Plan:   1.  Parkinsons disease, diagnosed 2021  -I do not think that the patient has an atypical state.  She does not describe the red flags, with the exception of diplopia and that did not develop for several years (3) into the disease.  I do not think the neck flexion is associated with Parkinson's, although it could be.  I think this is more orthopedic in nature.  She has apparently had that for many years.  -Patient is currently on Rytary 145 mg, 3 capsules 3 times per day and 1 at bed.  Looked a tad underdosed today but was seen near dosing time.  I did not change her medication today.  -asks about reordering PT.  I have no objection to that.  She really does not ambulate anymore, and the PT is really for range of motion.  She uses a Nurse, adult to get around  -she loves group exercise class at [facility] and encouraged [facility] to ask her each time they have exercise class   2.  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  Imaging since that time confirms that she has had multiple strokes, suggestive of embolic etiology.  Unfortunately, her history is such that she has had a hemorrhagic pericardial effusion after initiation of anticoagulation.  Her cardiologist initially felt she would not be a candidate for anticoagulation, but after review, they changed her mind and she is now on apixaban.  -Zio patch done July, 2024 with primarily sinus bradycardia, with brief episodes of atrial fibrillation.  -Records indicate that Dr. Excell Seltzer had reviewed her chart and thought she would be a good candidate for a Watchman device.  I do not see that they have followed up with cardiology since then.  However, she is back on apixaban so this is likely just a mute point.  -Talked extensively to the patient about stroke signs and symptoms and the importance of getting to the hospital should she have any of those.  -Patient is now in an  appropriate wheelchair that supports her much better.   3.  History of near syncope  -Metoprolol has now been discontinued as of December, 2024.  She is still on sotalol.  Daughter states that blood pressures improved significantly after metoprolol discontinued.  4.  Known history of atrial fibrillation  -Likely source of #2   Subjective:   Selena Bush was seen today in follow-up for her Parkinson's disease and her prior stroke with now right-sided weakness/spasticity.  Pt with daughter who supplements hx. she remains on Rytary.  She is doing well with this medication.  She has had no falls since last visit.  She was in the hospital/ER December 13 with transient confusion.  She was sitting in her wheelchair and suddenly became unresponsive.  Patient did not loss of consciousness, stating that she had a feeling come over her like she needed to go to sleep, but did not lose complete consciousness.  She was hypotensive on arrival, and it was suspected that symptoms were due to hypotension.  She was given fluids and discharged from the emergency room.  She does continue to follow with cardiology regarding her atrial fibrillation.  Because of the episode above, it was decided to discontinue her metoprolol.  She has had issues with neurogenic orthostatic hypotension in the past.  She followed back up with the cardiology nurse practitioner January 17 and complained of lower extremity edema since discontinuation  of spironolactone and Jardiance.  The nurse practitioner started her on torsemide.  She followed back up February 21 and seem to be doing better with that.  Pt is in exercise class at [facility] and she likes that.  She says that she is walking but daughter states that she isn't.  Daughter doesn't think she has done much PT lately.  Multiple UTI's.  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    Current movement disorder medications: Rytary 145 mg, 3 capsules 3 times per day and  1 at bedtime   ALLERGIES:   Allergies  Allergen Reactions   Penicillins Anaphylaxis    anaphylaxis  Has patient had a PCN reaction causing immediate rash, facial/tongue/throat swelling, SOB or lightheadedness with hypotension: Yes Has patient had a PCN reaction causing severe rash involving mucus membranes or skin necrosis: No Has patient had a PCN reaction that required hospitalization: No Has patient had a PCN reaction occurring within the last 10 years: No If all of the above answers are "NO", then may proceed with Cephalosporin use.    Sulfa Antibiotics Anaphylaxis   Codeine Other (See Comments) and Nausea And Vomiting    Gi problems    Statins Other (See Comments)    Leg pains   Xarelto [Rivaroxaban] Swelling    pericarditis   Aspirin Other (See Comments)    "burned my stomach intensely" Abdominal pain and burning    CURRENT MEDICATIONS:  Current Meds  Medication Sig   acetaminophen (TYLENOL) 500 MG tablet Take 500 mg by mouth every 6 (six) hours as needed for moderate pain.   apixaban (ELIQUIS) 5 MG TABS tablet Take 1 tablet (5 mg total) by mouth 2 (two) times daily.   bisacodyl (DULCOLAX) 5 MG EC tablet Take 5 mg by mouth daily as needed for moderate constipation.   Carbidopa-Levodopa ER (RYTARY) 36.25-145 MG CPCR 3 capsules at 7am/11am/4pm and 1 capsule at bed (Patient taking differently: 3 capsules at 7Am 12 pm and 5 pm1 capsule at bed)   Cholecalciferol (VITAMIN D-3) 25 MCG (1000 UT) CAPS Take 1 capsule by mouth daily.   Cyanocobalamin (VITAMIN B-12) 5000 MCG SUBL Place 1 each under the tongue daily.   DULoxetine (CYMBALTA) 60 MG capsule TAKE ONE CAPSULE BY MOUTH TWICE DAILY FOR anxiety AND depression (Patient taking differently: Take 60 mg by mouth 2 (two) times daily. TAKE ONE CAPSULE BY MOUTH TWICE DAILY FOR anxiety AND depression)   ezetimibe (ZETIA) 10 MG tablet TAKE ONE TABLET BY MOUTH EVERY MORNING (Patient taking differently: Take 10 mg by mouth daily.)    fluticasone (FLONASE) 50 MCG/ACT nasal spray Place 2 sprays into both nostrils daily as needed for rhinitis or allergies.   gabapentin (NEURONTIN) 600 MG tablet Take 600 mg by mouth as needed.   Melatonin 5 MG CHEW Chew 10 mg by mouth at bedtime.   Multiple Vitamins-Minerals (CEROVITE SENIOR PO) Take 1 tablet by mouth daily.   NONFORMULARY OR COMPOUNDED ITEM Dispense 1 custom manual wheelchair evaluation for improved positioning and pressure distribution. G20.A1   oxybutynin (DITROPAN) 5 MG tablet Take 5 mg by mouth 2 (two) times daily.   sotalol (BETAPACE) 120 MG tablet TAKE ONE TABLET BY MOUTH TWICE DAILY (Patient taking differently: Take 120 mg by mouth 2 (two) times daily.)   tiZANidine (ZANAFLEX) 4 MG tablet TAKE 2 TABLET (4 MG TOTAL) BY MOUTH DAILY FOR MUSCLE SPASMS AT BEDTIME.   torsemide (DEMADEX) 20 MG tablet Take 2 tablets  for the next 3 days then go back down to 1 tablet a day. Take one additional 20 mg tablet daily for lower extremity swelling, shortness of breath, a weight gain of 3 lbs overnight and 5 lbs over a week.     Objective:   VITALS:   Vitals:   10/30/23 1107  BP: 122/72  Pulse: 92  SpO2: 93%  Weight: 142 lb (64.4 kg)  Height: 5\' 5"  (1.651 m)    GEN:  The patient appears stated age and is in NAD. HEENT:  Normocephalic, atraumatic.  The mucous membranes are moist. The superficial temporal arteries are without ropiness or tenderness. CV:  RRR (no A-fib today) Lungs:  CTAB Neck/HEME:  There are no carotid bruits bilaterally.  Claudie Fisherman is on the chest.  Neurological examination:  Orientation: The patient is alert and oriented x3.  Cranial nerves: There is good facial symmetry. Extraocular muscles are intact. the speech is stuttering in quality and clear. Hearing is intact to conversational tone. Sensation: Sensation is intact to light touch throughout. Motor: Strength is 5/5 in the biceps.  She had mild trouble with deltoid strength on the right.  .  She continues to  have trouble with hip flexion on the right. Shoulder shrug is equal and symmetric.  There is no pronator drift.  She is sitting up much more upright with her current wheelchair (previously was very far to the right).   Movement examination: Tone: There is mild spasticity in the right leg.  There is mild to mod rigidity in the LUE (she is due for medication) Abnormal movements: None seen today Coordination:  There is no real decremation with rapid alternating movements.  She has trouble moving the leg on the right from prior infarct. Gait and Station: Unable per patient and daughter I have reviewed and interpreted the following labs independently   Chemistry      Component Value Date/Time   NA 143 09/01/2023 1250   NA 136 09/22/2014 0518   K 4.1 09/01/2023 1250   K 3.6 09/22/2014 0518   CL 97 09/01/2023 1250   CL 97 (L) 09/22/2014 0518   CO2 28 09/01/2023 1250   CO2 29 09/22/2014 0518   BUN 16 09/01/2023 1250   BUN 13 09/22/2014 0518   CREATININE 0.83 09/01/2023 1250   CREATININE 1.19 (H) 06/07/2016 1450      Component Value Date/Time   CALCIUM 10.5 (H) 09/01/2023 1250   CALCIUM 8.4 (L) 09/22/2014 0518   ALKPHOS 60 07/28/2023 1157   AST 30 07/28/2023 1157   ALT 7 07/28/2023 1157   BILITOT 0.6 07/28/2023 1157   BILITOT 0.3 05/21/2021 1505      Lab Results  Component Value Date   TSH 1.780 05/21/2021   Lab Results  Component Value Date   WBC 4.7 07/28/2023   HGB 12.2 07/28/2023   HCT 36.0 07/28/2023   MCV 111.1 (H) 07/28/2023   PLT 100 (L) 07/28/2023     Total time spent on today's visit was 33 minutes, including both face-to-face time and nonface-to-face time.  Time included that spent on review of records (prior notes available to me/labs/imaging if pertinent), discussing treatment and goals, answering patient's questions and coordinating care.  Cc:  Jethro Bastos, MD

## 2023-10-30 ENCOUNTER — Telehealth: Payer: Self-pay | Admitting: Cardiology

## 2023-10-30 ENCOUNTER — Ambulatory Visit (INDEPENDENT_AMBULATORY_CARE_PROVIDER_SITE_OTHER): Payer: Medicare (Managed Care) | Admitting: Neurology

## 2023-10-30 ENCOUNTER — Encounter: Payer: Self-pay | Admitting: Neurology

## 2023-10-30 VITALS — BP 122/72 | HR 92 | Ht 65.0 in | Wt 142.0 lb

## 2023-10-30 DIAGNOSIS — R29898 Other symptoms and signs involving the musculoskeletal system: Secondary | ICD-10-CM | POA: Diagnosis not present

## 2023-10-30 DIAGNOSIS — R269 Unspecified abnormalities of gait and mobility: Secondary | ICD-10-CM | POA: Diagnosis not present

## 2023-10-30 DIAGNOSIS — G20A1 Parkinson's disease without dyskinesia, without mention of fluctuations: Secondary | ICD-10-CM | POA: Diagnosis not present

## 2023-10-30 DIAGNOSIS — R531 Weakness: Secondary | ICD-10-CM | POA: Diagnosis not present

## 2023-10-30 NOTE — Telephone Encounter (Signed)
 Calling to say the patient has been giving the extra pills, but leg is still swollen. Please advise

## 2023-10-30 NOTE — Patient Instructions (Addendum)
 Encourage patient/ask patient to attend group exercise class each time it is offered. PT - evaluate and treat

## 2023-10-30 NOTE — Telephone Encounter (Signed)
 Patient identification verified by 2 forms. Shade Flood, RN     Tried calling but no answer. [id number]

## 2023-10-31 NOTE — Telephone Encounter (Signed)
 Patient identification verified by 2 forms. Marilynn Rail, RN    Called and spoke to [employer] states:   -swelling has decreased this morning   -for the past three days patient received additional 20mg  torsemide daily   -patient completed requested lab work with [facility]   -she will contact [facility] and have requested blood work faxed   -she has no questions or concerns at this time   -plans to follow up at 4/4 OV with NP Chad  Informed [name] message sent to NP Chad as Lorain Childes

## 2023-11-07 ENCOUNTER — Other Ambulatory Visit: Payer: Self-pay | Admitting: Vascular Surgery

## 2023-11-07 ENCOUNTER — Ambulatory Visit
Admission: RE | Admit: 2023-11-07 | Discharge: 2023-11-07 | Disposition: A | Discharge status: Home / Self Care | Payer: Medicare (Managed Care) | Source: Ambulatory Visit | Attending: Vascular Surgery | Admitting: Vascular Surgery

## 2023-11-07 DIAGNOSIS — R059 Cough, unspecified: Secondary | ICD-10-CM

## 2023-11-16 NOTE — Progress Notes (Signed)
 Cardiology Office Note    Date:  11/19/2023  ID:  MONROE TOURE, DOB 1945-10-20, MRN [id number] PCP:  [facility]  Cardiologist:  Olga Millers, MD  Electrophysiologist:  None   Chief Complaint: Follow up for chronic diastolic HF   History of Present Illness: .    Selena Bush is a 78 y.o. female with visit-pertinent history of chronic diastolic heart failure, paroxysmal atrial fibrillation not on anticoagulation given history of hemorrhagic pericardial effusion, aortic stenosis, hypertension, hyperlipidemia, carotid artery stenosis and parkinsonism.  Ms. Reising was hospitalized in March 2019 in the setting of atypical chest pain and paroxysmal atrial fibrillation.  Unfortunately she returned to the hospital significant dyspnea.  Follow-up echocardiogram showed large pericardial effusion with tamponade physiology, she underwent pericardiocentesis in April 2019.  Cytology was negative.  It was felt that her initial presentation was likely pericarditis and initiation of anticoagulation resulted in hemorrhagic pericardial effusion.  Anticoagulation was discontinued.  Echocardiogram in 09/2020 showed normal LV function, mild LVH, grade 2 diastolic dysfunction, moderate pulmonary hypertension, severe left atrial enlargement, mild aortic stenosis with mean gradient 10 mmHg.  Repeat echo in 11/2022 showed EF 65 to 70%, normal LV function, no RWMA, mild LVH, G1 DD, normal RV systolic function, no evidence of pericardial effusion and no evidence of aortic stenosis.  Carotid Dopplers per neurology revealed 1 to 39% bilateral ICA stenosis.  MRI in 12/2022 indicated multiple strokes, chronic infarcts in left frontal lobe and left parietal lobe as well as acute/early subacute and right precentral gyrus, suggestive of embolic etiology.    At follow-up visit in 02/2023 she was largely wheelchair-bound where she had previously been more mobile.  She was started on Plavix 75 mg  daily.  Cardiac monitor in 02/2023 showed an average heart rate of 54 bpm, ranging from 41 238 bpm.  Predominant underlying rhythm was sinus rhythm.  There was less than 1% A-fib burden, ranging from 95 to 130 bpm. She was started on Eliquis 5 mg twice daily and Plavix was discontinued. An echo was ordered for monitoring given history of hemorrhagic pericardial effusion on Eliquis.  Echo on 03/21/2023 indicated LVEF of 65 to 70%, no RWMA, mild LVH of the basal septal segment, grade 2 diastolic dysfunction, there is aortic valve sclerosis without evidence of stenosis and no evidence of pericardial effusion.   On 07/28/2023 she presented to the emergency room with episode of altered mental status felt secondary to hypotension.  She saw Dr. Jens Som in follow-up on 08/01/2023 regarding episodes of hypotension.  Her metoprolol was discontinued as well as her Jardiance and spironolactone.  Her Lasix was decreased to 20 mg daily as needed.  On 09/01/2023 she presented for follow-up.  She reported that she was doing okay however she noted she had significant problems with lower extremity edema since discontinuation of Jardiance and spironolactone.  She reported that at [facility] her Lasix had been increased to 40 mg twice daily without significant improvement.  She was wearing compression stockings regularly.  Her Lasix was stopped and she was started on torsemide 40 mg daily for 3 days followed by torsemide 20 mg daily thereafter.   Patient was last seen in clinic on 10/06/2023.  They reported that her lower extremity edema had significantly improved with torsemide 40 mg daily, when she decreased back down to 20 mg daily she started to have increased lower extremity it again.  Patient was instructed that she could take 1 additional 20 mg  tablet of torsemide daily for increased lower extremity edema, orthopnea or PND.  Today she presents for follow-up.  She reports that she is doing well.  She denies any chest pain,  increased shortness of breath, orthopnea or PND.  She continues attending [facility] daily.  She is mainly wheelchair-bound and assisted by her friend.  They note last month that she had a 3-day period where she had some increased lower extremity edema, improved with torsemide, they note since then she has had significant improvement in her lower extremity edema.  Labwork independently reviewed: 10/18/2023: Hemoglobin 12.2, hematocrit 37, creatinine 0.93, potassium 3.9, sodium 143  ROS: .   Today she denies chest pain, shortness of breath, fatigue, palpitations, melena, hematuria, hemoptysis, diaphoresis, weakness, presyncope, syncope, orthopnea, and PND.  All other systems are reviewed and otherwise negative.  Studies Reviewed: [name]    EKG:  EKG is not ordered today.  CV Studies: Cardiac studies reviewed are outlined and summarized above. Otherwise please see EMR for full report. Cardiac Studies & Procedures   ______________________________________________________________________________________________ CARDIAC CATHETERIZATION  CARDIAC CATHETERIZATION 11/13/2017  Narrative Conclusions: 1. Successful ultrasound and fluoroscopic guided pericardiocentesis and pericardial drain placement (tube pericardiostomy) via the subxiphoid approach yielding 620 mL of blood fluid.  Recommendations: 1. Transfer to 2 Heart for pericardial drain management. 2. Obtain STAT CXR when patient arrives in ICU to confirm tube placement and exclude complications such as pneumothorax. 3. Keep drain to negative pressure using Doval device; record output every 4 hours. 4. Drain to be removed when less than 50 mL of output in 24 hours and no significant residual effusion on echo. 5. Hold all anticoagulants at this time.  Yvonne Kendall, MD [facility] Pager: [phone]     ECHOCARDIOGRAM  ECHOCARDIOGRAM COMPLETE 03/21/2023  Narrative ECHOCARDIOGRAM REPORT    Patient Name:   Selena Bush Date of Exam:  03/21/2023 Medical Rec #:  [id number]       Height:       65.0 in Accession #:    [id number]      Weight:       144.0 lb Date of Birth:  January 19, 1946       BSA:          1.720 m Patient Age:    76 years        BP:           98/76 mmHg Patient Gender: F               HR:           60 bpm. Exam Location:  Outpatient  Procedure: 2D Echo, 3D Echo, Cardiac Doppler, Color Doppler and Strain Analysis  Indications:    Endocarditis  History:        Patient has prior history of Echocardiogram examinations, most recent 11/15/2022. CHF, Arrythmias:Atrial Fibrillation; Risk Factors:Non-Smoker, Dyslipidemia and Hypertension. CKD; Pericardial Effusion with cardiac tamponade; Parkinson.  Sonographer:    Jeryl Columbia RDCS Referring Phys: [id number] CAITLIN Tilman Neat   Sonographer Comments: Limited mobility; echo performed in wheelchair due to patient being unable to move to bed. IMPRESSIONS   1. Left ventricular ejection fraction, by estimation, is 65 to 70%. The left ventricle has normal function. The left ventricle has no regional wall motion abnormalities. There is mild left ventricular hypertrophy of the basal-septal segment. Left ventricular diastolic parameters are consistent with Grade II diastolic dysfunction (pseudonormalization). Elevated left ventricular end-diastolic pressure. 2. Right ventricular systolic function is normal. The right ventricular size is  mildly enlarged. 3. Left atrial size was mildly dilated. 4. The mitral valve is degenerative. Trivial mitral valve regurgitation. No evidence of mitral stenosis. Severe mitral annular calcification. 5. The aortic valve is tricuspid. There is moderate calcification of the aortic valve. There is mild thickening of the aortic valve. Aortic valve regurgitation is not visualized. Aortic valve sclerosis/calcification is present, without any evidence of aortic stenosis.  FINDINGS Left Ventricle: Left ventricular ejection fraction, by estimation, is 65  to 70%. The left ventricle has normal function. The left ventricle has no regional wall motion abnormalities. The left ventricular internal cavity size was normal in size. There is mild left ventricular hypertrophy of the basal-septal segment. Left ventricular diastolic parameters are consistent with Grade II diastolic dysfunction (pseudonormalization). Elevated left ventricular end-diastolic pressure.  Right Ventricle: The right ventricular size is mildly enlarged. No increase in right ventricular wall thickness. Right ventricular systolic function is normal.  Left Atrium: Left atrial size was mildly dilated.  Right Atrium: Right atrial size was normal in size.  Pericardium: There is no evidence of pericardial effusion.  Mitral Valve: The mitral valve is degenerative in appearance. There is mild thickening of the mitral valve leaflet(s). There is mild calcification of the mitral valve leaflet(s). Severe mitral annular calcification. Trivial mitral valve regurgitation. No evidence of mitral valve stenosis.  Tricuspid Valve: The tricuspid valve is normal in structure. Tricuspid valve regurgitation is trivial. No evidence of tricuspid stenosis.  Aortic Valve: The aortic valve is tricuspid. There is moderate calcification of the aortic valve. There is mild thickening of the aortic valve. There is severe aortic valve annular calcification. Aortic valve regurgitation is not visualized. Aortic valve sclerosis/calcification is present, without any evidence of aortic stenosis.  Pulmonic Valve: The pulmonic valve was normal in structure. Pulmonic valve regurgitation is mild. No evidence of pulmonic stenosis.  Aorta: The aortic root is normal in size and structure.  Venous: The inferior vena cava was not well visualized.  IAS/Shunts: No atrial level shunt detected by color flow Doppler.   LEFT VENTRICLE PLAX 2D LVIDd:         4.97 cm   Diastology LVIDs:         2.64 cm   LV e' medial:    3.70  cm/s LV PW:         0.84 cm   LV E/e' medial:  16.0 LV IVS:        1.16 cm   LV e' lateral:   4.03 cm/s LVOT diam:     1.80 cm   LV E/e' lateral: 14.7 LV SV:         60 LV SV Index:   35 LVOT Area:     2.54 cm   RIGHT VENTRICLE RV Basal diam:  4.21 cm RV Mid diam:    4.26 cm RV S prime:     12.60 cm/s TAPSE (M-mode): 2.1 cm  LEFT ATRIUM             Index        RIGHT ATRIUM          Index LA diam:        5.10 cm 2.96 cm/m   RA Area:     9.32 cm LA Vol (A2C):   62.2 ml 36.16 ml/m  RA Volume:   18.50 ml 10.75 ml/m LA Vol (A4C):   52.8 ml 30.69 ml/m LA Biplane Vol: 63.7 ml 37.03 ml/m AORTIC VALVE LVOT Vmax:   95.20 cm/s LVOT Vmean:  59.900 cm/s LVOT VTI:    0.236 m  AORTA Ao Root diam: 3.20 cm Ao Asc diam:  3.40 cm  MITRAL VALVE               TRICUSPID VALVE MV Area (PHT): 3.48 cm    TR Peak grad:   16.8 mmHg MV Decel Time: 218 msec    TR Vmax:        205.00 cm/s MV E velocity: 59.10 cm/s MV A velocity: 68.10 cm/s  SHUNTS MV E/A ratio:  0.87        Systemic VTI:  0.24 m Systemic Diam: 1.80 cm  Armanda Magic MD Electronically signed by Armanda Magic MD Signature Date/Time: 03/21/2023/7:09:31 PM    Final    MONITORS  LONG TERM MONITOR (3-14 DAYS) 03/07/2023  Narrative Patch Wear Time:  13 days and 19 hours (2024-07-02T12:18:33-0400 to 2024-07-16T07:52:57-0400)  Patient had a min HR of 41 bpm, max HR of 138 bpm, and avg HR of 54 bpm. Predominant underlying rhythm was Sinus Rhythm. 3 Supraventricular Tachycardia runs occurred, the run with the fastest interval lasting 7 beats with a max rate of 138 bpm, the longest lasting 9 beats with an avg rate of 94 bpm. Atrial Fibrillation occurred (<1% burden), ranging from 95-130 bpm (avg of 115 bpm), the longest lasting 1 min 10 secs with an avg rate of 115 bpm. Isolated SVEs were rare (<1.0%), SVE Couplets were rare (<1.0%), and SVE Triplets were rare (<1.0%). Isolated VEs were rare (<1.0%), VE Couplets were rare (<1.0%),  and no VE Triplets were present. Ventricular Bigeminy and Trigeminy were present.  Sinus bradycardia, normal sinus rhythm, occasional PAC, brief PAT, short episodes of paroxysmal atrial fibrillation, rare PVC and couplet. Olga Millers       ______________________________________________________________________________________________       Current Reported Medications:.    Current Meds  Medication Sig   acetaminophen (TYLENOL) 500 MG tablet Take 500 mg by mouth every 6 (six) hours as needed for moderate pain.   apixaban (ELIQUIS) 5 MG TABS tablet Take 1 tablet (5 mg total) by mouth 2 (two) times daily.   bisacodyl (DULCOLAX) 5 MG EC tablet Take 5 mg by mouth daily as needed for moderate constipation.   Carbidopa-Levodopa ER (RYTARY) 36.25-145 MG CPCR 3 capsules at 7am/11am/4pm and 1 capsule at bed (Patient taking differently: 3 capsules at 7Am 12 pm and 5 pm1 capsule at bed)   Cholecalciferol (VITAMIN D-3) 25 MCG (1000 UT) CAPS Take 1 capsule by mouth daily.   Cyanocobalamin (VITAMIN B-12) 5000 MCG SUBL Place 1 each under the tongue daily.   DULoxetine (CYMBALTA) 60 MG capsule TAKE ONE CAPSULE BY MOUTH TWICE DAILY FOR anxiety AND depression (Patient taking differently: Take 60 mg by mouth 2 (two) times daily. TAKE ONE CAPSULE BY MOUTH TWICE DAILY FOR anxiety AND depression)   ezetimibe (ZETIA) 10 MG tablet TAKE ONE TABLET BY MOUTH EVERY MORNING (Patient taking differently: Take 10 mg by mouth daily.)   fluticasone (FLONASE) 50 MCG/ACT nasal spray Place 2 sprays into both nostrils daily as needed for rhinitis or allergies.   gabapentin (NEURONTIN) 600 MG tablet Take 600 mg by mouth as needed.   Melatonin 5 MG CHEW Chew 10 mg by mouth at bedtime.   Multiple Vitamins-Minerals (CEROVITE SENIOR PO) Take 1 tablet by mouth daily.   NONFORMULARY OR COMPOUNDED ITEM Dispense 1 custom manual wheelchair evaluation for improved positioning and pressure distribution. G20.A1   oxybutynin (DITROPAN) 5  MG tablet Take 5 mg  by mouth 2 (two) times daily.   sotalol (BETAPACE) 120 MG tablet TAKE ONE TABLET BY MOUTH TWICE DAILY (Patient taking differently: Take 120 mg by mouth 2 (two) times daily.)   torsemide (DEMADEX) 20 MG tablet Take 2 tablets for the next 3 days then go back down to 1 tablet a day. Take one additional 20 mg tablet daily for lower extremity swelling, shortness of breath, a weight gain of 3 lbs overnight and 5 lbs over a week.   Physical Exam:    VS:  BP 130/81   Pulse (!) 58   Ht 5\' 5"  (1.651 m)   Wt 133 lb (60.3 kg)   SpO2 92%   BMI 22.13 kg/m    Wt Readings from Last 3 Encounters:  11/17/23 133 lb (60.3 kg)  10/30/23 142 lb (64.4 kg)  10/06/23 144 lb (65.3 kg)    GEN: Well nourished, well developed in no acute distress NECK: No JVD; No carotid bruits CARDIAC: RRR, no murmurs, rubs, gallops RESPIRATORY:  Clear to auscultation without rales, wheezing or rhonchi  ABDOMEN: Soft, non-tender, non-distended EXTREMITIES:  Mild bilateral lower extremity edema; No acute deformity     Asessement and Plan:.    Chronic diastolic HF/Lower extremity edema: Patient with history of diastolic heart failure.  In December patient's Jardiance, metoprolol and spironolactone were discontinued given frequent hypotension and altered mental status.  Since patient has increased lower extremity edema, previously transition from Lasix to torsemide with some improvement. Today she reports that she is doing well, she had some increased lower extremity edema last month that improved with her as needed torsemide.  Today she appears euvolemic and well compensated on exam.  Encouraged low-sodium diet. Continue torsemide 20 mg daily, she can take 1 extra 20 mg tablet as needed daily for increased lower extremity edema, orthopnea or PND.  Hypotension: Blood pressure today 130/81.  Notes her appetite has somewhat improved, encouraged to remain well-hydrated. Patient is closely monitored by providers at  [facility].  Paroxysmal atrial fibrillation/CVA: Patient has history of atrial fibrillation in setting of pericarditis and pneumonia, also had episode noted on cardiac monitor in 02/2023.  Per Dr. Jens Som given history of CVA she requires long-term anticoagulation. Today she denies any increased palpitations or feeling of increased heart rates. Rate and rhythm is regular on auscultation. Reports she is tolerating Eliquis well, denies any significant bleeding.  Continue Eliquis 5 mg twice daily and sotalol.  Hyperlipidemia: Last lipid profile on 10/16/2023 indicated total cholesterol 108, triglycerides 68, HDL 69 and LDL 25. Patient with a history of statin intolerance, continue Zetia.  Parkinson's disorder: Continue to monitor for symptoms of orthostatic hypotension.  Blood pressures appear to be stable, she is now overall wheelchair-bound and attends [facility] regularly.  She continues to work to stay well-hydrated, blood pressure today has improved.   Disposition: F/u with Dr. Jens Som in 01/2024 as planned.   Signed, Rip Harbour, NP

## 2023-11-17 ENCOUNTER — Encounter: Payer: Self-pay | Admitting: Cardiology

## 2023-11-17 ENCOUNTER — Ambulatory Visit: Payer: Medicare (Managed Care) | Attending: Cardiology | Admitting: Cardiology

## 2023-11-17 VITALS — BP 130/81 | HR 58 | Ht 65.0 in | Wt 133.0 lb

## 2023-11-17 DIAGNOSIS — R600 Localized edema: Secondary | ICD-10-CM | POA: Diagnosis not present

## 2023-11-17 DIAGNOSIS — I480 Paroxysmal atrial fibrillation: Secondary | ICD-10-CM

## 2023-11-17 DIAGNOSIS — G20C Parkinsonism, unspecified: Secondary | ICD-10-CM

## 2023-11-17 DIAGNOSIS — I5032 Chronic diastolic (congestive) heart failure: Secondary | ICD-10-CM | POA: Diagnosis not present

## 2023-11-17 DIAGNOSIS — I959 Hypotension, unspecified: Secondary | ICD-10-CM

## 2023-11-17 DIAGNOSIS — Z8673 Personal history of transient ischemic attack (TIA), and cerebral infarction without residual deficits: Secondary | ICD-10-CM

## 2023-11-17 DIAGNOSIS — E7800 Pure hypercholesterolemia, unspecified: Secondary | ICD-10-CM

## 2023-11-17 NOTE — Patient Instructions (Signed)
   Follow-Up: At [facility], you and your health needs are our priority.  As part of our continuing mission to provide you with exceptional heart care, our providers are all part of one team.  This team includes your primary Cardiologist (physician) and Advanced Practice Providers or APPs (Physician Assistants and Nurse Practitioners) who all work together to provide you with the care you need, when you need it.  Your next appointment:   As scheduled  We recommend signing up for the patient portal called "MyChart".  Sign up information is provided on this After Visit Summary.  MyChart is used to connect with patients for Virtual Visits (Telemedicine).  Patients are able to view lab/test results, encounter notes, upcoming appointments, etc.  Non-urgent messages can be sent to your provider as well.   To learn more about what you can do with MyChart, go to [email or web address].         1st Floor: - Lobby - Registration  - Pharmacy  - Lab - Cafe  2nd Floor: - PV Lab - Diagnostic Testing (echo, CT, nuclear med)  3rd Floor: - Vacant  4th Floor: - TCTS (cardiothoracic surgery) - AFib Clinic - Structural Heart Clinic - Vascular Surgery  - Vascular Ultrasound  5th Floor: - HeartCare Cardiology (general and EP) - Clinical Pharmacy for coumadin, hypertension, lipid, weight-loss medications, and med management appointments    Valet parking services will be available as well.

## 2023-11-19 ENCOUNTER — Encounter: Payer: Self-pay | Admitting: Cardiology

## 2023-12-13 ENCOUNTER — Telehealth: Payer: Self-pay | Admitting: Family

## 2023-12-13 MED ORDER — TORSEMIDE 20 MG PO TABS: ORAL_TABLET | ORAL | 0 refills | Status: DC

## 2023-12-13 NOTE — Telephone Encounter (Signed)
 Pt's daughter calling to report that pt has developed Sob which is new over the weekend mostly when reclined and edema has been on/off , had improved but has now returned to right foot and ankle. Requesting cb

## 2023-12-13 NOTE — Telephone Encounter (Signed)
 Spoke to patient's daughter she stated since this past Saturday her mother has increased swelling in right foot and ankle.She is sob when she lies back in recliner.She is unable to weigh.Advised to double Torsemide  dose for the next 3 mornings then return to normal dose.Appointment scheduled with Neomi Banks NP 5/9 at 2:45 pm at [facility] office.I will make Dr.Crenshaw aware.

## 2023-12-22 ENCOUNTER — Encounter (HOSPITAL_BASED_OUTPATIENT_CLINIC_OR_DEPARTMENT_OTHER): Payer: Self-pay | Admitting: Family

## 2023-12-22 ENCOUNTER — Ambulatory Visit (INDEPENDENT_AMBULATORY_CARE_PROVIDER_SITE_OTHER): Payer: Medicare (Managed Care) | Admitting: Family

## 2023-12-22 VITALS — BP 122/60 | HR 59 | Ht 63.0 in

## 2023-12-22 DIAGNOSIS — I480 Paroxysmal atrial fibrillation: Secondary | ICD-10-CM | POA: Diagnosis not present

## 2023-12-22 DIAGNOSIS — I5032 Chronic diastolic (congestive) heart failure: Secondary | ICD-10-CM | POA: Diagnosis not present

## 2023-12-22 DIAGNOSIS — D6859 Other primary thrombophilia: Secondary | ICD-10-CM

## 2023-12-22 DIAGNOSIS — R0609 Other forms of dyspnea: Secondary | ICD-10-CM | POA: Diagnosis not present

## 2023-12-22 DIAGNOSIS — J302 Other seasonal allergic rhinitis: Secondary | ICD-10-CM

## 2023-12-22 DIAGNOSIS — G20A1 Parkinson's disease without dyskinesia, without mention of fluctuations: Secondary | ICD-10-CM

## 2023-12-22 DIAGNOSIS — E785 Hyperlipidemia, unspecified: Secondary | ICD-10-CM

## 2023-12-22 NOTE — Progress Notes (Unsigned)
  Cardiology Office Note:  .   Date:  12/22/2023  ID:  Selena Bush, DOB 1945-08-29, MRN [id number] PCP: [facility]  [facility] Providers Cardiologist:  Alexandria Angel, MD { Click to update primary MD,subspecialty MD or APP then REFRESH:1}   History of Present Illness: .   Selena Bush is a 78 y.o. female with hx of HFpEF, PAF, CVA, HLD, Parkinson's disorder  December 2024 Jardiance , Metoprolo, Spironolactone  were discontinued due to hypotension and AMS. Lasix  later transitioned to Torsemide  due to LE edema with some improvement. Last seen 11/17/23 by Katlyn West, NP 11/17/23 doing well with edema improved with Torsemide  20mg  daily and additional 20mg  PRN.  Daughter contacted the office 12/13/23 noting new dyspnea particularly when reclined and LE edema to right foot.She was advised to double Torsemide  x 3 days and scheduled for follow up.    [facility] took her off abx  ?CXR  Night aid  Trimethoprim Would be permissible to be on suppressive   Not getting fluid during  ?dehydrated - forc efluids  9:30am - 3:30  Body armour  30 oz fluid  ROS: ***  Studies Reviewed: .        *** Risk Assessment/Calculations:   {Does this patient have ATRIAL FIBRILLATION?:[phone]}         Physical Exam:   VS:  BP 122/60 (BP Location: Left Arm, Patient Position: Sitting, Cuff Size: Normal)   Pulse (!) 59   Ht 5\' 3"  (1.6 m)   SpO2 (!) 89%   BMI 23.56 kg/m    Wt Readings from Last 3 Encounters:  11/17/23 133 lb (60.3 kg)  10/30/23 142 lb (64.4 kg)  10/06/23 144 lb (65.3 kg)    GEN: Well nourished, well developed in no acute distress NECK: No JVD; No carotid bruits CARDIAC: ***RRR, no murmurs, rubs, gallops RESPIRATORY:  Clear to auscultation without rales, wheezing or rhonchi  ABDOMEN: Soft, non-tender, non-distended EXTREMITIES:  No edema; No deformity   ASSESSMENT AND PLAN: .   ***    {Are you ordering a CV Procedure (e.g. stress  test, cath, DCCV, TEE, etc)?   Press F2        :[id number]}  Dispo: ***  Signed, Clearnce Curia, NP

## 2023-12-22 NOTE — Patient Instructions (Addendum)
 Medication Instructions:  Continue Torsemide  20mg  daily with an additional 20mg  as needed for increased swelling, shortness of breath. If needing extra tablet more than twice per week, please let us  know.   [facility] team: Please resume daily antibiotic as recommended by urology team. If concerns regarding antibiotic, please reach out to urology team prior to discontinuing.   Recommend taking Allegra or Zyrtec or Xyzal once per day over the counter to help with allergies. If taking Zyrtec or Xyzal take in the evening.  Continue Flonase  one spray per nostril in the morning.   Lab Work: CBC/BMET/BNP TODAY   If you have labs (blood work) drawn today and your tests are completely normal, you will receive your results only by: MyChart Message (if you have MyChart) OR A paper copy in the mail If you have any lab test that is abnormal or we need to change your treatment, we will call you to review the results.  Testing/Procedures: NONE  Follow-Up: KEEP AS SCHEDULED   Other Instructions TRY TO DRINK AROUND 30 OUNCES WHILE AT [facility] AND A TOTAL OF 50-60 OUNCES PER DAY   Pursed Lip Breathing Pursed lip breathing is a technique to relieve the feeling of being short of breath.  Being short of breath can make you tense and anxious. Before you start this breathing exercise, take a minute to relax your shoulders and close your eyes. Then: Start the exercise by closing your mouth. Breathe in through your nose, taking a normal breath. You can do this at your normal rate of breathing. If you feel you are not getting enough air, breathe in while slowly counting to 2 or 3. Pucker (purse) your lips as if you were going to whistle. Gently tighten the muscles of your abdomenor press on your abdomen to help push the air out. Breathe out slowly through your pursed lips. Take at least twice as long to breathe out as it takes you to breathe in. Make sure that you breathe out all of the air, but do not force air  out.

## 2023-12-23 LAB — CBC
Hematocrit: 36.9 % (ref 34.0–46.6)
Hemoglobin: 11.7 g/dL (ref 11.1–15.9)
MCH: 30.4 pg (ref 26.6–33.0)
MCHC: 31.7 g/dL (ref 31.5–35.7)
MCV: 96 fL (ref 79–97)
Platelets: 258 10*3/uL (ref 150–450)
RBC: 3.85 x10E6/uL (ref 3.77–5.28)
RDW: 12.1 % (ref 11.7–15.4)
WBC: 8.7 10*3/uL (ref 3.4–10.8)

## 2023-12-23 LAB — BRAIN NATRIURETIC PEPTIDE: BNP: 135.4 pg/mL — ABNORMAL HIGH (ref 0.0–100.0)

## 2023-12-23 LAB — BASIC METABOLIC PANEL WITH GFR
BUN/Creatinine Ratio: 21 (ref 12–28)
BUN: 17 mg/dL (ref 8–27)
CO2: 29 mmol/L (ref 20–29)
Calcium: 9.4 mg/dL (ref 8.7–10.3)
Chloride: 92 mmol/L — ABNORMAL LOW (ref 96–106)
Creatinine, Ser: 0.81 mg/dL (ref 0.57–1.00)
Glucose: 123 mg/dL — ABNORMAL HIGH (ref 70–99)
Potassium: 3.4 mmol/L — ABNORMAL LOW (ref 3.5–5.2)
Sodium: 138 mmol/L (ref 134–144)
eGFR: 76 mL/min/{1.73_m2} (ref >59)

## 2023-12-24 ENCOUNTER — Encounter (HOSPITAL_BASED_OUTPATIENT_CLINIC_OR_DEPARTMENT_OTHER): Payer: Self-pay

## 2023-12-24 DIAGNOSIS — R600 Localized edema: Secondary | ICD-10-CM

## 2023-12-24 DIAGNOSIS — R0609 Other forms of dyspnea: Secondary | ICD-10-CM

## 2023-12-24 DIAGNOSIS — E876 Hypokalemia: Secondary | ICD-10-CM

## 2023-12-24 DIAGNOSIS — I5032 Chronic diastolic (congestive) heart failure: Secondary | ICD-10-CM

## 2023-12-25 MED ORDER — POTASSIUM CHLORIDE CRYS ER 20 MEQ PO TBCR: 20.0000 meq | EXTENDED_RELEASE_TABLET | Freq: Every day | ORAL | 3 refills | Status: DC

## 2023-12-25 MED ORDER — TORSEMIDE 20 MG PO TABS: ORAL_TABLET | ORAL | 3 refills | Status: DC

## 2023-12-25 NOTE — Telephone Encounter (Signed)
 Called pt- informed any results should just go straight to daughter. Called daughter- results reviewed. Scripts printed and will be faxed to [facility]

## 2023-12-27 ENCOUNTER — Telehealth (HOSPITAL_BASED_OUTPATIENT_CLINIC_OR_DEPARTMENT_OTHER): Payer: Self-pay | Admitting: Family

## 2023-12-27 MED ORDER — TORSEMIDE 20 MG PO TABS: ORAL_TABLET | ORAL | 3 refills | Status: DC

## 2023-12-27 NOTE — Addendum Note (Signed)
 Addended by: Josemiguel Gries on: 12/27/2023 02:54 PM   Modules accepted: Orders

## 2023-12-27 NOTE — Telephone Encounter (Signed)
*  STAT* If patient is at the pharmacy, call can be transferred to refill team.   1. Which medications need to be refilled? (please list name of each medication and dose if known) need a prescription for her local pharmacy for Torsemide    2. Would you like to learn more about the convenience, safety, & potential cost savings by using the [facility]?    3. Are you open to using the [facility] (Type [facility]. .   4. Which pharmacy/location (including street and city if local pharmacy) is medication to be sent to? [facility] [address] [place]    5. Do they need a 30 day or 90 day supply? #7

## 2023-12-28 ENCOUNTER — Ambulatory Visit (INDEPENDENT_AMBULATORY_CARE_PROVIDER_SITE_OTHER): Payer: Medicare (Managed Care) | Admitting: Podiatry

## 2023-12-28 DIAGNOSIS — Z91199 Patient's noncompliance with other medical treatment and regimen due to unspecified reason: Secondary | ICD-10-CM

## 2023-12-30 NOTE — Progress Notes (Signed)
 Patient absent from appointment

## 2024-01-02 ENCOUNTER — Telehealth: Payer: Self-pay | Admitting: Cardiology

## 2024-01-02 NOTE — Telephone Encounter (Signed)
 Patient identification verified by 2 forms.   Called and spoke to patient's daughter: Patient's daughter states:  -Provider at [employer] wants to start pt on Miralax . -They do not check with her specialists before they start her on different medications - we aren't sure if that is the best thing for her since she has fluid retention.  -Want to know if it's okay for her    Patient denies:  -Normal bowel movements.  (Until last night last bowel movement was 12 days ago.)            Interventions/Plan: -Please send any medications to [facility] [place] not to [place].  -Will get message to primary cardiology and pharm-D for advice.   Reviewed ED warning signs/precautions  Patient agrees with plan, no questions at this time

## 2024-01-02 NOTE — Telephone Encounter (Signed)
 Calling to see if the patient needs to take magnesium , that is recommend for her to do. Please advised

## 2024-01-03 NOTE — Telephone Encounter (Signed)
Spoke with pt daughter, Aware of dr crenshaw's recommendations.  

## 2024-01-15 ENCOUNTER — Other Ambulatory Visit: Payer: Self-pay

## 2024-01-15 ENCOUNTER — Emergency Department (HOSPITAL_COMMUNITY): Payer: Medicare (Managed Care)

## 2024-01-15 ENCOUNTER — Inpatient Hospital Stay (HOSPITAL_COMMUNITY)
Admission: EM | Admit: 2024-01-15 | Discharge: 2024-02-13 | DRG: 391 | Disposition: E | Payer: Medicare (Managed Care) | Attending: Family Medicine | Admitting: Family Medicine

## 2024-01-15 ENCOUNTER — Encounter (HOSPITAL_COMMUNITY): Payer: Self-pay | Admitting: *Deleted

## 2024-01-15 ENCOUNTER — Telehealth: Payer: Self-pay | Admitting: Cardiology

## 2024-01-15 DIAGNOSIS — E1143 Type 2 diabetes mellitus with diabetic autonomic (poly)neuropathy: Secondary | ICD-10-CM | POA: Diagnosis present

## 2024-01-15 DIAGNOSIS — J9621 Acute and chronic respiratory failure with hypoxia: Secondary | ICD-10-CM | POA: Diagnosis present

## 2024-01-15 DIAGNOSIS — Z66 Do not resuscitate: Secondary | ICD-10-CM | POA: Diagnosis not present

## 2024-01-15 DIAGNOSIS — R339 Retention of urine, unspecified: Secondary | ICD-10-CM | POA: Diagnosis present

## 2024-01-15 DIAGNOSIS — Z882 Allergy status to sulfonamides status: Secondary | ICD-10-CM

## 2024-01-15 DIAGNOSIS — I10 Essential (primary) hypertension: Secondary | ICD-10-CM | POA: Diagnosis present

## 2024-01-15 DIAGNOSIS — J9602 Acute respiratory failure with hypercapnia: Secondary | ICD-10-CM

## 2024-01-15 DIAGNOSIS — K5900 Constipation, unspecified: Secondary | ICD-10-CM | POA: Diagnosis present

## 2024-01-15 DIAGNOSIS — Z8673 Personal history of transient ischemic attack (TIA), and cerebral infarction without residual deficits: Secondary | ICD-10-CM

## 2024-01-15 DIAGNOSIS — K5289 Other specified noninfective gastroenteritis and colitis: Secondary | ICD-10-CM | POA: Diagnosis not present

## 2024-01-15 DIAGNOSIS — G20A1 Parkinson's disease without dyskinesia, without mention of fluctuations: Secondary | ICD-10-CM | POA: Diagnosis present

## 2024-01-15 DIAGNOSIS — E7800 Pure hypercholesterolemia, unspecified: Secondary | ICD-10-CM | POA: Diagnosis present

## 2024-01-15 DIAGNOSIS — E662 Morbid (severe) obesity with alveolar hypoventilation: Secondary | ICD-10-CM | POA: Diagnosis present

## 2024-01-15 DIAGNOSIS — I9589 Other hypotension: Secondary | ICD-10-CM | POA: Diagnosis present

## 2024-01-15 DIAGNOSIS — E785 Hyperlipidemia, unspecified: Secondary | ICD-10-CM | POA: Diagnosis present

## 2024-01-15 DIAGNOSIS — R54 Age-related physical debility: Secondary | ICD-10-CM | POA: Diagnosis present

## 2024-01-15 DIAGNOSIS — Z86000 Personal history of in-situ neoplasm of breast: Secondary | ICD-10-CM | POA: Diagnosis present

## 2024-01-15 DIAGNOSIS — N179 Acute kidney failure, unspecified: Secondary | ICD-10-CM | POA: Diagnosis present

## 2024-01-15 DIAGNOSIS — Z6841 Body Mass Index (BMI) 40.0 and over, adult: Secondary | ICD-10-CM

## 2024-01-15 DIAGNOSIS — Z96653 Presence of artificial knee joint, bilateral: Secondary | ICD-10-CM | POA: Diagnosis present

## 2024-01-15 DIAGNOSIS — Z515 Encounter for palliative care: Secondary | ICD-10-CM

## 2024-01-15 DIAGNOSIS — I480 Paroxysmal atrial fibrillation: Secondary | ICD-10-CM | POA: Diagnosis present

## 2024-01-15 DIAGNOSIS — K439 Ventral hernia without obstruction or gangrene: Secondary | ICD-10-CM | POA: Diagnosis present

## 2024-01-15 DIAGNOSIS — J9622 Acute and chronic respiratory failure with hypercapnia: Secondary | ICD-10-CM | POA: Diagnosis present

## 2024-01-15 DIAGNOSIS — R0602 Shortness of breath: Secondary | ICD-10-CM

## 2024-01-15 DIAGNOSIS — F32A Depression, unspecified: Secondary | ICD-10-CM | POA: Diagnosis present

## 2024-01-15 DIAGNOSIS — E8729 Other acidosis: Secondary | ICD-10-CM | POA: Diagnosis present

## 2024-01-15 DIAGNOSIS — R001 Bradycardia, unspecified: Secondary | ICD-10-CM | POA: Diagnosis present

## 2024-01-15 DIAGNOSIS — Z880 Allergy status to penicillin: Secondary | ICD-10-CM

## 2024-01-15 DIAGNOSIS — Z923 Personal history of irradiation: Secondary | ICD-10-CM

## 2024-01-15 DIAGNOSIS — I5032 Chronic diastolic (congestive) heart failure: Secondary | ICD-10-CM | POA: Diagnosis present

## 2024-01-15 DIAGNOSIS — Z87892 Personal history of anaphylaxis: Secondary | ICD-10-CM

## 2024-01-15 DIAGNOSIS — I110 Hypertensive heart disease with heart failure: Secondary | ICD-10-CM | POA: Diagnosis present

## 2024-01-15 DIAGNOSIS — Z9049 Acquired absence of other specified parts of digestive tract: Secondary | ICD-10-CM

## 2024-01-15 DIAGNOSIS — I959 Hypotension, unspecified: Secondary | ICD-10-CM | POA: Diagnosis present

## 2024-01-15 DIAGNOSIS — Z888 Allergy status to other drugs, medicaments and biological substances status: Secondary | ICD-10-CM

## 2024-01-15 DIAGNOSIS — Z7901 Long term (current) use of anticoagulants: Secondary | ICD-10-CM

## 2024-01-15 DIAGNOSIS — F419 Anxiety disorder, unspecified: Secondary | ICD-10-CM | POA: Diagnosis present

## 2024-01-15 DIAGNOSIS — Z886 Allergy status to analgesic agent status: Secondary | ICD-10-CM

## 2024-01-15 DIAGNOSIS — Z90710 Acquired absence of both cervix and uterus: Secondary | ICD-10-CM

## 2024-01-15 DIAGNOSIS — E876 Hypokalemia: Secondary | ICD-10-CM | POA: Diagnosis not present

## 2024-01-15 DIAGNOSIS — Z803 Family history of malignant neoplasm of breast: Secondary | ICD-10-CM

## 2024-01-15 DIAGNOSIS — J189 Pneumonia, unspecified organism: Secondary | ICD-10-CM | POA: Diagnosis present

## 2024-01-15 DIAGNOSIS — G9341 Metabolic encephalopathy: Secondary | ICD-10-CM | POA: Diagnosis present

## 2024-01-15 DIAGNOSIS — Z800 Family history of malignant neoplasm of digestive organs: Secondary | ICD-10-CM

## 2024-01-15 DIAGNOSIS — Z87440 Personal history of urinary (tract) infections: Secondary | ICD-10-CM

## 2024-01-15 DIAGNOSIS — Z808 Family history of malignant neoplasm of other organs or systems: Secondary | ICD-10-CM

## 2024-01-15 DIAGNOSIS — Z79899 Other long term (current) drug therapy: Secondary | ICD-10-CM

## 2024-01-15 DIAGNOSIS — J9620 Acute and chronic respiratory failure, unspecified whether with hypoxia or hypercapnia: Secondary | ICD-10-CM | POA: Diagnosis present

## 2024-01-15 DIAGNOSIS — Z9011 Acquired absence of right breast and nipple: Secondary | ICD-10-CM

## 2024-01-15 DIAGNOSIS — Z853 Personal history of malignant neoplasm of breast: Secondary | ICD-10-CM

## 2024-01-15 DIAGNOSIS — G4710 Hypersomnia, unspecified: Secondary | ICD-10-CM | POA: Diagnosis present

## 2024-01-15 LAB — COMPREHENSIVE METABOLIC PANEL WITH GFR
ALT: UNDETERMINED U/L (ref 0–44)
AST: 21 U/L (ref 15–41)
Albumin: 3.4 g/dL — ABNORMAL LOW (ref 3.5–5.0)
Alkaline Phosphatase: 47 U/L (ref 38–126)
Anion gap: 14 (ref 5–15)
BUN: 14 mg/dL (ref 8–23)
CO2: 28 mmol/L (ref 22–32)
Calcium: 9.6 mg/dL (ref 8.9–10.3)
Chloride: 90 mmol/L — ABNORMAL LOW (ref 98–111)
Creatinine, Ser: 1.13 mg/dL — ABNORMAL HIGH (ref 0.44–1.00)
GFR, Estimated: 51 mL/min — ABNORMAL LOW (ref >60)
Glucose, Bld: 103 mg/dL — ABNORMAL HIGH (ref 70–99)
Potassium: 4.2 mmol/L (ref 3.5–5.1)
Sodium: 132 mmol/L — ABNORMAL LOW (ref 135–145)
Total Bilirubin: 0.8 mg/dL (ref 0.0–1.2)
Total Protein: UNDETERMINED g/dL (ref 6.5–8.1)

## 2024-01-15 LAB — CBC
HCT: 39.2 % (ref 36.0–46.0)
Hemoglobin: 13.1 g/dL (ref 12.0–15.0)
MCH: 31 pg (ref 26.0–34.0)
MCHC: 33.4 g/dL (ref 30.0–36.0)
MCV: 92.7 fL (ref 80.0–100.0)
Platelets: 161 10*3/uL (ref 150–400)
RBC: 4.23 MIL/uL (ref 3.87–5.11)
RDW: 13.2 % (ref 11.5–15.5)
WBC: 8.9 10*3/uL (ref 4.0–10.5)
nRBC: 0 % (ref 0.0–0.2)

## 2024-01-15 LAB — LIPASE, BLOOD: Lipase: 30 U/L (ref 11–51)

## 2024-01-15 NOTE — Telephone Encounter (Signed)
  Pattie Borders called to reschedule the appointment because [facility] transportation can't bring the patient on 06/05. The appointment was rescheduled with Morey Ar to 06/04 at 9:15 AM. Pattie Borders spoke with [facility], and they can provide transportation to the [place] office.

## 2024-01-15 NOTE — ED Triage Notes (Signed)
 Pt arrives from home via [facility], goes to [facility] during the day. [facility] reported pt did not urinate today, home health reported the same this evening. No fluid intake today during [facility], when she got home, they gave her several cups of fluids to try to hydrate her. CHF, she is on double fluid pills at this time, d/t fluid retention. Constipation x 1 week. Finished Abdominal distention. Denies pain. Vitals en route were 98/56, hr 50, 89% room air (pt family reporting that this is normal for her). Appears SOB when sitting up- she says she feels like her belly is pushing up against her. Mild confusion

## 2024-01-15 NOTE — Telephone Encounter (Signed)
 Pts friend Abby called to report that the pt has not been well the last 3 days with "extreme labored breathing" and edema of her lower extremities... she says that [facility] saw her and told her that is was her "end stage CHF" which upset her... she gave her 3 days of diuretics even though it had been stopped but there has been no relief. She will call EMS so that they can come and assess.

## 2024-01-15 NOTE — Telephone Encounter (Signed)
 Pt c/o Shortness Of Breath: STAT if SOB developed within the last 24 hours or pt is noticeably SOB on the phone  1. Are you currently SOB (can you hear that pt is SOB on the phone)?   Caller stated patient is having very labored breathing  2. How long have you been experiencing SOB?   Last Thursday and getting continually worse  3. Are you SOB when sitting or when up moving around?   When sitting.  Patient is in a wheelchair  4. Are you currently experiencing any other symptoms?   No    Pt c/o swelling/edema: STAT if pt has developed SOB within 24 hours  If swelling, where is the swelling located?   Legs and ankles  How much weight have you gained and in what time span?   Caller stated she could not tell  Have you gained 2 pounds in a day or 5 pounds in a week?   Do you have a log of your daily weights (if so, list)?   Are you currently taking a fluid pill?   Caller stated they re-started patient on fluid pill yesterday  Are you currently SOB?   Yes  Have you traveled recently in a car or plane for an extended period of time? No  Caller (Abby) stated patient was off fluid pills but has started having labored breathing.

## 2024-01-15 NOTE — Telephone Encounter (Signed)
 Pts friend/ Abby called back to report that the pt saw [facility] today and told her that the pts VS were okay and her breathing was improving, no intervention.... they reccommended that she see Cardio this week.. I add  her an appt with an APP 01/18/24 but she will call us  if anything changes or worsens.

## 2024-01-16 ENCOUNTER — Emergency Department (HOSPITAL_COMMUNITY): Payer: Medicare (Managed Care)

## 2024-01-16 ENCOUNTER — Inpatient Hospital Stay (HOSPITAL_COMMUNITY): Payer: Medicare (Managed Care)

## 2024-01-16 DIAGNOSIS — F32A Depression, unspecified: Secondary | ICD-10-CM | POA: Diagnosis present

## 2024-01-16 DIAGNOSIS — E876 Hypokalemia: Secondary | ICD-10-CM | POA: Diagnosis not present

## 2024-01-16 DIAGNOSIS — I5031 Acute diastolic (congestive) heart failure: Secondary | ICD-10-CM | POA: Diagnosis not present

## 2024-01-16 DIAGNOSIS — Z923 Personal history of irradiation: Secondary | ICD-10-CM | POA: Diagnosis not present

## 2024-01-16 DIAGNOSIS — J9621 Acute and chronic respiratory failure with hypoxia: Secondary | ICD-10-CM | POA: Diagnosis present

## 2024-01-16 DIAGNOSIS — K5289 Other specified noninfective gastroenteritis and colitis: Principal | ICD-10-CM

## 2024-01-16 DIAGNOSIS — Z66 Do not resuscitate: Secondary | ICD-10-CM | POA: Diagnosis not present

## 2024-01-16 DIAGNOSIS — N179 Acute kidney failure, unspecified: Secondary | ICD-10-CM | POA: Diagnosis present

## 2024-01-16 DIAGNOSIS — J189 Pneumonia, unspecified organism: Secondary | ICD-10-CM | POA: Diagnosis present

## 2024-01-16 DIAGNOSIS — E7800 Pure hypercholesterolemia, unspecified: Secondary | ICD-10-CM | POA: Diagnosis present

## 2024-01-16 DIAGNOSIS — I480 Paroxysmal atrial fibrillation: Secondary | ICD-10-CM | POA: Diagnosis present

## 2024-01-16 DIAGNOSIS — J9622 Acute and chronic respiratory failure with hypercapnia: Secondary | ICD-10-CM | POA: Diagnosis present

## 2024-01-16 DIAGNOSIS — Z96653 Presence of artificial knee joint, bilateral: Secondary | ICD-10-CM | POA: Diagnosis present

## 2024-01-16 DIAGNOSIS — G9341 Metabolic encephalopathy: Secondary | ICD-10-CM | POA: Diagnosis present

## 2024-01-16 DIAGNOSIS — E8729 Other acidosis: Secondary | ICD-10-CM | POA: Diagnosis present

## 2024-01-16 DIAGNOSIS — Z515 Encounter for palliative care: Secondary | ICD-10-CM | POA: Diagnosis not present

## 2024-01-16 DIAGNOSIS — E662 Morbid (severe) obesity with alveolar hypoventilation: Secondary | ICD-10-CM | POA: Diagnosis present

## 2024-01-16 DIAGNOSIS — Z7901 Long term (current) use of anticoagulants: Secondary | ICD-10-CM | POA: Diagnosis not present

## 2024-01-16 DIAGNOSIS — G20A1 Parkinson's disease without dyskinesia, without mention of fluctuations: Secondary | ICD-10-CM | POA: Diagnosis present

## 2024-01-16 DIAGNOSIS — Z7189 Other specified counseling: Secondary | ICD-10-CM | POA: Diagnosis not present

## 2024-01-16 DIAGNOSIS — R339 Retention of urine, unspecified: Secondary | ICD-10-CM | POA: Diagnosis present

## 2024-01-16 DIAGNOSIS — I110 Hypertensive heart disease with heart failure: Secondary | ICD-10-CM | POA: Diagnosis present

## 2024-01-16 DIAGNOSIS — I350 Nonrheumatic aortic (valve) stenosis: Secondary | ICD-10-CM | POA: Diagnosis not present

## 2024-01-16 DIAGNOSIS — Z6841 Body Mass Index (BMI) 40.0 and over, adult: Secondary | ICD-10-CM | POA: Diagnosis not present

## 2024-01-16 DIAGNOSIS — E1143 Type 2 diabetes mellitus with diabetic autonomic (poly)neuropathy: Secondary | ICD-10-CM | POA: Diagnosis present

## 2024-01-16 DIAGNOSIS — I5032 Chronic diastolic (congestive) heart failure: Secondary | ICD-10-CM | POA: Diagnosis present

## 2024-01-16 DIAGNOSIS — Z9011 Acquired absence of right breast and nipple: Secondary | ICD-10-CM | POA: Diagnosis not present

## 2024-01-16 LAB — I-STAT ARTERIAL BLOOD GAS, ED
Acid-Base Excess: 10 mmol/L — ABNORMAL HIGH (ref 0.0–2.0)
Acid-Base Excess: 9 mmol/L — ABNORMAL HIGH (ref 0.0–2.0)
Acid-Base Excess: 11 mmol/L — ABNORMAL HIGH (ref 0.0–2.0)
Bicarbonate: 40.2 mmol/L — ABNORMAL HIGH (ref 20.0–28.0)
Bicarbonate: 37.9 mmol/L — ABNORMAL HIGH (ref 20.0–28.0)
Bicarbonate: 39.2 mmol/L — ABNORMAL HIGH (ref 20.0–28.0)
Calcium, Ion: 1.35 mmol/L (ref 1.15–1.40)
Calcium, Ion: 1.32 mmol/L (ref 1.15–1.40)
Calcium, Ion: 1.27 mmol/L (ref 1.15–1.40)
HCT: 32 % — ABNORMAL LOW (ref 36.0–46.0)
HCT: 32 % — ABNORMAL LOW (ref 36.0–46.0)
HCT: 31 % — ABNORMAL LOW (ref 36.0–46.0)
Hemoglobin: 10.9 g/dL — ABNORMAL LOW (ref 12.0–15.0)
Hemoglobin: 10.9 g/dL — ABNORMAL LOW (ref 12.0–15.0)
Hemoglobin: 10.5 g/dL — ABNORMAL LOW (ref 12.0–15.0)
O2 Saturation: 94 %
O2 Saturation: 93 %
O2 Saturation: 98 %
Patient temperature: 98
Patient temperature: 98.2
Potassium: 3.7 mmol/L (ref 3.5–5.1)
Potassium: 3.8 mmol/L (ref 3.5–5.1)
Potassium: 3.8 mmol/L (ref 3.5–5.1)
Sodium: 134 mmol/L — ABNORMAL LOW (ref 135–145)
Sodium: 135 mmol/L (ref 135–145)
Sodium: 135 mmol/L (ref 135–145)
TCO2: 43 mmol/L — ABNORMAL HIGH (ref 22–32)
TCO2: 40 mmol/L — ABNORMAL HIGH (ref 22–32)
TCO2: 41 mmol/L — ABNORMAL HIGH (ref 22–32)
pCO2 arterial: 88.9 mmHg (ref 32–48)
pCO2 arterial: 74.1 mmHg (ref 32–48)
pCO2 arterial: 68.7 mmHg (ref 32–48)
pH, Arterial: 7.263 — ABNORMAL LOW (ref 7.35–7.45)
pH, Arterial: 7.316 — ABNORMAL LOW (ref 7.35–7.45)
pH, Arterial: 7.363 (ref 7.35–7.45)
pO2, Arterial: 88 mmHg (ref 83–108)
pO2, Arterial: 76 mmHg — ABNORMAL LOW (ref 83–108)
pO2, Arterial: 106 mmHg (ref 83–108)

## 2024-01-16 LAB — CBC
HCT: 35.5 % — ABNORMAL LOW (ref 36.0–46.0)
Hemoglobin: 10.9 g/dL — ABNORMAL LOW (ref 12.0–15.0)
MCH: 30.3 pg (ref 26.0–34.0)
MCHC: 30.7 g/dL (ref 30.0–36.0)
MCV: 98.6 fL (ref 80.0–100.0)
Platelets: 192 10*3/uL (ref 150–400)
RBC: 3.6 MIL/uL — ABNORMAL LOW (ref 3.87–5.11)
RDW: 12.9 % (ref 11.5–15.5)
WBC: 9 10*3/uL (ref 4.0–10.5)
nRBC: 0 % (ref 0.0–0.2)

## 2024-01-16 LAB — MAGNESIUM: Magnesium: 2 mg/dL (ref 1.7–2.4)

## 2024-01-16 LAB — URINALYSIS, ROUTINE W REFLEX MICROSCOPIC
Bilirubin Urine: NEGATIVE
Glucose, UA: NEGATIVE mg/dL
Hgb urine dipstick: NEGATIVE
Ketones, ur: 5 mg/dL — AB
Leukocytes,Ua: NEGATIVE
Nitrite: NEGATIVE
Protein, ur: NEGATIVE mg/dL
Specific Gravity, Urine: 1.01 (ref 1.005–1.030)
pH: 5 (ref 5.0–8.0)

## 2024-01-16 LAB — BASIC METABOLIC PANEL WITH GFR
Anion gap: 8 (ref 5–15)
BUN: 14 mg/dL (ref 8–23)
CO2: 36 mmol/L — ABNORMAL HIGH (ref 22–32)
Calcium: 9.8 mg/dL (ref 8.9–10.3)
Chloride: 91 mmol/L — ABNORMAL LOW (ref 98–111)
Creatinine, Ser: 1.12 mg/dL — ABNORMAL HIGH (ref 0.44–1.00)
GFR, Estimated: 51 mL/min — ABNORMAL LOW (ref >60)
Glucose, Bld: 110 mg/dL — ABNORMAL HIGH (ref 70–99)
Potassium: 3.8 mmol/L (ref 3.5–5.1)
Sodium: 135 mmol/L (ref 135–145)

## 2024-01-16 LAB — BLOOD GAS, ARTERIAL
Acid-Base Excess: 12.7 mmol/L — ABNORMAL HIGH (ref 0.0–2.0)
Bicarbonate: 38.6 mmol/L — ABNORMAL HIGH (ref 20.0–28.0)
O2 Saturation: 99.1 %
Patient temperature: 38.3
pCO2 arterial: 56 mmHg — ABNORMAL HIGH (ref 32–48)
pH, Arterial: 7.45 (ref 7.35–7.45)
pO2, Arterial: 100 mmHg (ref 83–108)

## 2024-01-16 LAB — BLOOD GAS, VENOUS
Acid-Base Excess: 10.1 mmol/L — ABNORMAL HIGH (ref 0.0–2.0)
Bicarbonate: 41.9 mmol/L — ABNORMAL HIGH (ref 20.0–28.0)
O2 Saturation: 66.2 %
Patient temperature: 35.9
pCO2, Ven: 95 mmHg (ref 44–60)
pH, Ven: 7.24 — ABNORMAL LOW (ref 7.25–7.43)
pO2, Ven: 40 mmHg (ref 32–45)

## 2024-01-16 LAB — TROPONIN I (HIGH SENSITIVITY)
Troponin I (High Sensitivity): 9 ng/L (ref <18)
Troponin I (High Sensitivity): 9 ng/L (ref <18)

## 2024-01-16 LAB — BRAIN NATRIURETIC PEPTIDE: B Natriuretic Peptide: 413 pg/mL — ABNORMAL HIGH (ref 0.0–100.0)

## 2024-01-16 LAB — PHOSPHORUS: Phosphorus: 4.4 mg/dL (ref 2.5–4.6)

## 2024-01-16 MED ORDER — SOTALOL HCL 120 MG PO TABS
120.0000 mg | ORAL_TABLET | Freq: Two times a day (BID) | ORAL | Status: DC
  Administered 2024-01-16 – 2024-01-17 (×2): 120 mg via ORAL
  Filled 2024-01-16 (×7): qty 1

## 2024-01-16 MED ORDER — ALBUMIN HUMAN 25 % IV SOLN
25.0000 g | INTRAVENOUS | Status: DC
  Filled 2024-01-16 (×3): qty 100

## 2024-01-16 MED ORDER — TORSEMIDE 20 MG PO TABS
20.0000 mg | ORAL_TABLET | Freq: Every day | ORAL | Status: DC
  Administered 2024-01-16: 20 mg via ORAL
  Filled 2024-01-16 (×2): qty 1

## 2024-01-16 MED ORDER — SODIUM CHLORIDE 0.9 % IV BOLUS
500.0000 mL | Freq: Once | INTRAVENOUS | Status: AC
  Administered 2024-01-16: 500 mL via INTRAVENOUS

## 2024-01-16 MED ORDER — PROCHLORPERAZINE EDISYLATE 10 MG/2ML IJ SOLN: 5.0000 mg | Freq: Four times a day (QID) | INTRAMUSCULAR | Status: DC | PRN

## 2024-01-16 MED ORDER — SODIUM CHLORIDE 0.9 % IV SOLN
100.0000 mg | Freq: Two times a day (BID) | INTRAVENOUS | Status: DC
  Administered 2024-01-17 (×2): 100 mg via INTRAVENOUS
  Filled 2024-01-16 (×2): qty 100

## 2024-01-16 MED ORDER — POTASSIUM CHLORIDE CRYS ER 20 MEQ PO TBCR
40.0000 meq | EXTENDED_RELEASE_TABLET | Freq: Once | ORAL | Status: AC
  Administered 2024-01-16: 40 meq via ORAL
  Filled 2024-01-16: qty 2

## 2024-01-16 MED ORDER — ACETAMINOPHEN 325 MG PO TABS: 650.0000 mg | ORAL_TABLET | Freq: Four times a day (QID) | ORAL | Status: DC | PRN

## 2024-01-16 MED ORDER — SMOG ENEMA
960.0000 mL | Freq: Once | RECTAL | Status: DC
  Filled 2024-01-16: qty 960

## 2024-01-16 MED ORDER — ACETAMINOPHEN 10 MG/ML IV SOLN
1000.0000 mg | Freq: Once | INTRAVENOUS | Status: AC
  Administered 2024-01-17: 1000 mg via INTRAVENOUS
  Filled 2024-01-16: qty 100

## 2024-01-16 MED ORDER — ALBUMIN HUMAN 25 % IV SOLN
25.0000 g | INTRAVENOUS | Status: AC
  Administered 2024-01-16: 25 g via INTRAVENOUS

## 2024-01-16 MED ORDER — ENOXAPARIN SODIUM 60 MG/0.6ML IJ SOSY
1.0000 mg/kg | PREFILLED_SYRINGE | Freq: Two times a day (BID) | INTRAMUSCULAR | Status: DC
  Administered 2024-01-16 – 2024-01-18 (×6): 60 mg via SUBCUTANEOUS
  Filled 2024-01-16 (×7): qty 0.6

## 2024-01-16 MED ORDER — GABAPENTIN 300 MG PO CAPS: 600.0000 mg | ORAL_CAPSULE | Freq: Every day | ORAL | Status: DC

## 2024-01-16 MED ORDER — POLYETHYLENE GLYCOL 3350 17 G PO PACK
17.0000 g | PACK | Freq: Every day | ORAL | Status: DC | PRN
Start: 2024-01-16 — End: 2024-01-18

## 2024-01-16 MED ORDER — LEVOFLOXACIN IN D5W 750 MG/150ML IV SOLN: 750.0000 mg | INTRAVENOUS | Status: DC

## 2024-01-16 MED ORDER — LEVOFLOXACIN IN D5W 750 MG/150ML IV SOLN
750.0000 mg | INTRAVENOUS | Status: DC
  Administered 2024-01-17: 750 mg via INTRAVENOUS
  Filled 2024-01-16: qty 150

## 2024-01-16 NOTE — Progress Notes (Signed)
 Patient was transported on Bipap from the ED to room 3E30 with no problems.

## 2024-01-16 NOTE — ED Provider Notes (Addendum)
 [place] EMERGENCY DEPARTMENT AT [facility] Provider Note   CSN: [id number] Arrival date & time: 01/15/24  2119     History  Chief Complaint  Patient presents with   Urinary Retention   Bloated    Selena Bush is a 78 y.o. female.  HPI     This is a 78 year old female with a history of CHF and advanced Parkinson's disease who presents with decreased urination and abdominal bloating.  Most of the history is provided by her daughter.  Reports that she just finished a course of Keflex for UTI.  She also was noted to have some increased lower extremity swelling and was started on several days of increased diuretic dosage.  She has taken 2 additional diuretic pills over the last 48 hours.  However over the last 24 hours she has had minimal urine output.  Daughter reports that she did not have any noted urine output today or last night which would be abnormal.  She normally urinates into a diaper and has a home health aide that keeps a close eye on this.  States that she has been less interactive than normal but this is not unusual for her when she is not feeling good.  Daughter reports that she has not had a bowel movement in 9 to 10 days.  They have also noted some protrusion of her abdomen which they have not noted previously.  Daughter indicates that last night and today she also was noted to appear short of breath.  She did not complain of anything specifically.  Level 5 caveat  Home Medications Prior to Admission medications   Medication Sig Start Date End Date Taking? Authorizing Provider  acetaminophen  (TYLENOL ) 500 MG tablet Take 500 mg by mouth every 6 (six) hours as needed for moderate pain.    [provider]  apixaban  (ELIQUIS ) 5 MG TABS tablet Take 1 tablet (5 mg total) by mouth 2 (two) times daily. 02/21/23   Walker, Caitlin S, NP  bisacodyl  (DULCOLAX) 5 MG EC tablet Take 5 mg by mouth daily as needed for moderate constipation.    [provider]   Carbidopa -Levodopa  ER (RYTARY ) 36.25-145 MG CPCR 3 capsules at 7am/11am/4pm and 1 capsule at bed Patient taking differently: 3 capsules at 7Am 12 pm and 5 pm1 capsule at bed 04/26/23   Tat, Von Grumbling, DO  Cholecalciferol (VITAMIN D -3) 25 MCG (1000 UT) CAPS Take 1 capsule by mouth daily.    [provider]  Cyanocobalamin  (VITAMIN B-12) 5000 MCG SUBL Place 1 each under the tongue daily.    [provider]  DULoxetine  (CYMBALTA ) 60 MG capsule TAKE ONE CAPSULE BY MOUTH TWICE DAILY FOR anxiety AND depression Patient taking differently: Take 60 mg by mouth 2 (two) times daily. TAKE ONE CAPSULE BY MOUTH TWICE DAILY FOR anxiety AND depression 04/19/22   Clark, Katherine K, NP  ezetimibe  (ZETIA ) 10 MG tablet TAKE ONE TABLET BY MOUTH EVERY MORNING Patient taking differently: Take 10 mg by mouth daily. 06/23/21   Lenise Quince, MD  fluticasone  (FLONASE ) 50 MCG/ACT nasal spray Place 2 sprays into both nostrils daily as needed for rhinitis or allergies. 07/21/21   Clark, Katherine K, NP  gabapentin  (NEURONTIN ) 600 MG tablet Take 600 mg by mouth as needed.    [provider]  Melatonin 5 MG CHEW Chew 10 mg by mouth at bedtime.    [provider]  Multiple Vitamins-Minerals (CEROVITE SENIOR PO) Take 1 tablet by mouth daily.  [provider]  NONFORMULARY OR COMPOUNDED ITEM Dispense 1 custom manual wheelchair evaluation for improved positioning and pressure distribution. G20.A1 05/02/23   Tat, Von Grumbling, DO  oxybutynin  (DITROPAN ) 5 MG tablet Take 5 mg by mouth 2 (two) times daily. 11/01/21   [provider]  potassium chloride  SA (KLOR-CON  M) 20 MEQ tablet Take 1 tablet (20 mEq total) by mouth daily. 12/25/23   Walker, Caitlin S, NP  sotalol  (BETAPACE ) 120 MG tablet TAKE ONE TABLET BY MOUTH TWICE DAILY Patient taking differently: Take 120 mg by mouth 2 (two) times daily. 05/26/21   Lenise Quince, MD  torsemide  (DEMADEX ) 20 MG tablet Take 40mg  (two tablets)  for 3 days, then return to 20mg  (1 tablet) daily w/ an extra 20mg  as needed for edema, swelling, weight gain of 2 pounds overnight or 5 pounds in one week. 12/27/23   Walker, Caitlin S, NP      Allergies    Penicillins, Sulfa antibiotics, Codeine, Statins, Xarelto  [rivaroxaban ], and Aspirin     Review of Systems   Review of Systems  Unable to perform ROS: Acuity of condition    Physical Exam Updated Vital Signs BP (!) 169/85   Pulse (!) 54   Temp (!) 96.7 F (35.9 C) (Axillary)   Resp 18   SpO2 100%  Physical Exam Vitals and nursing note reviewed.  Constitutional:      Appearance: She is well-developed.     Comments: Somnolent but arousable, does not interact much  HENT:     Head: Normocephalic and atraumatic.     Mouth/Throat:     Mouth: Mucous membranes are dry.  Eyes:     Pupils: Pupils are equal, round, and reactive to light.  Cardiovascular:     Rate and Rhythm: Normal rate and regular rhythm.     Heart sounds: Normal heart sounds.  Pulmonary:     Effort: Pulmonary effort is normal. No respiratory distress.     Breath sounds: No wheezing.     Comments: Nasal cannula in place Abdominal:     General: Bowel sounds are normal.     Palpations: Abdomen is soft.     Tenderness: There is no abdominal tenderness.     Comments: Ventral hernia noted, easily reducible  Genitourinary:    Comments: Foley catheter placed with greater than 400 cc output Musculoskeletal:     Cervical back: Neck supple.     Right lower leg: Edema present.     Left lower leg: Edema present.  Skin:    General: Skin is warm and dry.  Neurological:     Comments: Unable to assess orientation, appears to move all 4 extremities  Psychiatric:     Comments: Unable to assess     ED Results / Procedures / Treatments   Labs (all labs ordered are listed, but only abnormal results are displayed) Labs Reviewed  COMPREHENSIVE METABOLIC PANEL WITH GFR - Abnormal; Notable for the following components:       Result Value   Sodium 132 (*)    Chloride 90 (*)    Glucose, Bld 103 (*)    Creatinine, Ser 1.13 (*)    Albumin 3.4 (*)    GFR, Estimated 51 (*)    All other components within normal limits  URINALYSIS, ROUTINE W REFLEX MICROSCOPIC - Abnormal; Notable for the following components:   Ketones, ur 5 (*)    All other components within normal limits  BRAIN NATRIURETIC PEPTIDE - Abnormal; Notable for the following components:  B Natriuretic Peptide 413.0 (*)    All other components within normal limits  I-STAT ARTERIAL BLOOD GAS, ED - Abnormal; Notable for the following components:   pH, Arterial 7.263 (*)    pCO2 arterial 88.9 (*)    Bicarbonate 40.2 (*)    TCO2 43 (*)    Acid-Base Excess 10.0 (*)    Sodium 134 (*)    HCT 32.0 (*)    Hemoglobin 10.9 (*)    All other components within normal limits  LIPASE, BLOOD  CBC  CBC  BASIC METABOLIC PANEL WITH GFR  MAGNESIUM   PHOSPHORUS  BLOOD GAS, VENOUS  TROPONIN I (HIGH SENSITIVITY)  TROPONIN I (HIGH SENSITIVITY)    EKG EKG Interpretation Date/Time:  Monday January 15 2024 21:09:48 EDT Ventricular Rate:  52 PR Interval:  156 QRS Duration:  92 QT Interval:  470 QTC Calculation: 437 R Axis:   39  Text Interpretation: Poor data quality, interpretation may be adversely affected Sinus bradycardia Left ventricular hypertrophy with repolarization abnormality ( R in aVL ) Abnormal ECG When compared with ECG of 28-Jul-2023 12:17, PREVIOUS ECG IS PRESENT Confirmed by Donita Furrow ([id number]) on 01/16/2024 2:33:57 AM  Radiology CT Head Wo Contrast Result Date: 01/16/2024 EXAM: CT HEAD WITHOUT 01/16/2024 03:08:52 AM TECHNIQUE: CT of the head was performed without the administration of intravenous contrast. Automated exposure control, iterative reconstruction, and/or weight based adjustment of the mA/kV was utilized to reduce the radiation dose to as low as reasonably achievable. COMPARISON: 07/28/2023 CLINICAL HISTORY: Mental status change,  unknown cause. FINDINGS: BRAIN AND VENTRICLES: There is no acute intracranial hemorrhage, mass effect or midline shift. No abnormal extra-axial fluid collection. The gray-white differentiation is maintained without an acute infarct. There is no hydrocephalus. Global cortical atrophy. Subcortical and periventricular small vessel ischemic changes. ORBITS: The visualized portion of the orbits demonstrate no acute abnormality. SINUSES: The visualized paranasal sinuses and mastoid air cells demonstrate no acute abnormality. SOFT TISSUES AND SKULL: No acute abnormality of the visualized skull or soft tissues. VASCULATURE: Intracranial atherosclerosis. IMPRESSION: 1. No acute intracranial abnormality. 2. Global cortical atrophy and small vessel ischemic changes. Electronically signed by: Zadie Herter MD 01/16/2024 03:14 AM EDT RP Workstation: [id number]   CT ABDOMEN PELVIS WO CONTRAST Result Date: 01/16/2024 CLINICAL DATA:  Acute nonlocalized abdominal pain. EXAM: CT ABDOMEN AND PELVIS WITHOUT CONTRAST TECHNIQUE: Multidetector CT imaging of the abdomen and pelvis was performed following the standard protocol without IV contrast. RADIATION DOSE REDUCTION: This exam was performed according to the departmental dose-optimization program which includes automated exposure control, adjustment of the mA and/or kV according to patient size and/or use of iterative reconstruction technique. COMPARISON:  Ultrasound kidneys 01/04/2024 FINDINGS: Lower chest: Motion artifact limits examination. Bilateral perihilar infiltrates likely representing pneumonia or edema. Mild cardiac enlargement. Hepatobiliary: No focal liver abnormality is seen. Status post cholecystectomy. No biliary dilatation. Pancreas: Unremarkable. No pancreatic ductal dilatation or surrounding inflammatory changes. Spleen: Normal in size without focal abnormality. Adrenals/Urinary Tract: Adrenal glands are unremarkable. Kidneys are normal, without renal calculi,  focal lesion, or hydronephrosis. Bladder is decompressed with a Foley catheter. Stomach/Bowel: Stomach, small bowel, and colon are not abnormally distended. The colon is diffusely stool-filled. Stool-filled rectum with rectal wall thickening may indicate stercoral colitis. Appendix is normal. Vascular/Lymphatic: Aortic atherosclerosis. No enlarged abdominal or pelvic lymph nodes. Reproductive: Status post hysterectomy. No adnexal masses. Other: No free air or free fluid in the abdomen. Diastasis or laxity of the anterior abdominal wall musculature without true herniation. Musculoskeletal: Degenerative changes  in the spine. No acute bony abnormalities. IMPRESSION: 1. Diffusely stool-filled colon consistent with constipation. Stool-filled rectum with rectal wall thickening suggesting stercoral colitis. 2. Aortic atherosclerosis. 3. Bilateral perihilar infiltrates suggesting pneumonia or edema. Cardiac enlargement. Electronically Signed   By: Boyce Byes M.D.   On: 01/16/2024 00:27   DG Chest 2 View Result Date: 01/15/2024 CLINICAL DATA:  SOB CHF EXAM: CHEST - 2 VIEW COMPARISON:  11/07/2023. FINDINGS: Elevated right hemidiaphragm. Pulmonary vascular congestion. No pneumothorax. Enlarged cardiac silhouette. Thoracic degenerative changes. Calcified aorta. Thoracic degenerative disc disease. IMPRESSION: Enlarged cardiac silhouette with vascular congestion. Electronically Signed   By: Sydell Eva M.D.   On: 01/15/2024 22:12    Procedures .Critical Care  Performed by: Rory Collard, MD Authorized by: Rory Collard, MD   Critical care provider statement:    Critical care time (minutes):  75   Critical care was necessary to treat or prevent imminent or life-threatening deterioration of the following conditions:  Respiratory failure   Critical care was time spent personally by me on the following activities:  Development of treatment plan with patient or surrogate, discussions with consultants,  evaluation of patient's response to treatment, examination of patient, ordering and review of laboratory studies, ordering and review of radiographic studies, ordering and performing treatments and interventions, pulse oximetry, re-evaluation of patient's condition and review of old charts     Medications Ordered in ED Medications  acetaminophen  (TYLENOL ) tablet 650 mg (has no administration in time range)  prochlorperazine (COMPAZINE) injection 5 mg (has no administration in time range)  polyethylene glycol (MIRALAX  / GLYCOLAX ) packet 17 g (has no administration in time range)  sodium chloride  0.9 % bolus 500 mL (0 mLs Intravenous Stopped 01/16/24 0322)    ED Course/ Medical Decision Making/ A&P Clinical Course as of 01/16/24 0327  Tue Jan 16, 2024  0148 Patient increasingly somnolent.  Requested nursing find her a room and remove her from the hallway.  She needs full monitoring.  ABG ordered. [CH]  0158 Patient's PCO2 88.9.  pH 7.26.  Patient patient on BiPAP.  Likely CO2 narcosis.  Daughter reports she is known to take very shallow breaths and uses CPAP at night. [CH]  0200 Per daughter, patient is full code. [CH]  0232 Got called to the patient's bedside for low BP.  Systolic of 80.  Daughter reports low BP at baseline.  500 cc of fluid ordered.  Also added a head CT. [CH]  0327 CT reviewed negative.  Blood pressure markedly improved.  Dr. Del Favia at bedside. [CH]    Clinical Course User Index [CH] Brenetta Penny, Vonzella Guernsey, MD                                 Medical Decision Making Amount and/or Complexity of Data Reviewed Labs: ordered. Radiology: ordered.  Risk Decision regarding hospitalization.   This patient presents to the ED for concern of urinary retention, decreased urinary output, this involves an extensive number of treatment options, and is a complaint that carries with it a high risk of complications and morbidity.  I considered the following differential and admission for  this acute, potentially life threatening condition.  The differential diagnosis includes acute retention, retention secondary to constipation, UTI, pneumonia, pneumothorax  MDM:    This is a 78 year old female who presents initially with concerns for decreased urination.  Does appear that she had some urinary retention with greater than 400  cc out.  Have been dry for over 12 hours.  Currently on a diuretic.  Has mild AKI which is likely related to diuresis.  Daughter noted some increased shortness of breath.  Chest x-ray without pneumothorax or pneumonia.  EKG without acute ischemic changes.  Troponin x 2 negative.  BNP is slightly elevated.  She has some lower extremity swelling but does not feel overtly volume overloaded.  Requested that nursing patient placed on the monitor in the hallway given that she is somnolent.  In the meantime, she does have a ventral hernia and daughter reports increasing constipation.  Given that she is unable to provide much history, CT imaging was obtained.  This does show significant constipation and stercoral colitis.  She likely needs full cleanout.  This is likely contributing some to her urinary retention. Unfortunately she had increasing somnolence.  ABG confirms respiratory acidosis with a PCO2 of 88.  Normally on CPAP at night.  Patient was moved into room and placed on BiPAP.  Daughter confirmed full code.  Will plan for admission for urinary retention, AKI, stercoral colitis, hypercarbic respiratory failure.  (Labs, imaging, consults)  Labs: I Ordered, and personally interpreted labs.  The pertinent results include: CBC, BMP, BNP, troponin, urinalysis  Imaging Studies ordered: I ordered imaging studies including chest x-ray, abdominal CT I independently visualized and interpreted imaging. I agree with the radiologist interpretation  Additional history obtained from chart review, daughter.  External records from outside source obtained and reviewed including  prior evaluations  Cardiac Monitoring: The patient was maintained on a cardiac monitor.  If on the cardiac monitor, I personally viewed and interpreted the cardiac monitored which showed an underlying rhythm of: Sinus  Reevaluation: After the interventions noted above, I reevaluated the patient and found that they have :worsened  Social Determinants of Health:  lives at home  Disposition: Admit  Co morbidities that complicate the patient evaluation  Past Medical History:  Diagnosis Date   Anxiety    Atrial fibrillation (HCC)    Cat bite of right hand 03/25/2019   Chronic diastolic CHF (congestive heart failure) (HCC)    a. echo 09/2014: EF 60-65%, diastolic dysfunction, mild LVH, nl RV size & systolic function, mildly dilated LA (4.3 cm), mild MR/TR, mildly elevated PASP 36.7 mm Hg   DDD (degenerative disc disease), cervical    DDD (degenerative disc disease), lumbar    Depression    Diffuse cystic mastopathy 2014   Dyspnea    Dysrhythmia    GERD (gastroesophageal reflux disease)    Gout    Headache    rare   HLD (hyperlipidemia)    a. statin intolerant 2/2 myalgias   Hx of dysplastic nevus 12/25/2018   L medial ankle   Hypercholesterolemia    Hypertension    Malignant neoplasm of upper-outer quadrant of female breast (HCC) 10/2012   Papillary DCIS, sentinel node negative. DR/PR positive. PARTIAL RIGHT MASTECTOMY FOR BREAST CANCER--HAD RADIATION - NO CHEMO --DR. CRYSTAL Ranchester ONCOLOGIST   Obesity    OSA on CPAP    Osteoarthritis of both knees    a. s/p right TKA 04/2013 & left TKA 09/2014   Otitis externa    Parkinson disease [facility])    Personal history of radiation therapy 2015   RIGHT breast-mammosite per pt   Pneumonia of both lungs due to infectious organism 01/08/2021   Sleep difficulties    LUNESTA HAS HELPED   Vaginal cyst      Medicines Meds ordered this encounter  Medications   acetaminophen  (TYLENOL ) tablet 650 mg   prochlorperazine (COMPAZINE)  injection 5 mg   polyethylene glycol (MIRALAX  / GLYCOLAX ) packet 17 g   sodium chloride  0.9 % bolus 500 mL   DISCONTD: albumin human 25 % solution 25 g    I have reviewed the patients home medicines and have made adjustments as needed  Problem List / ED Course: Problem List Items Addressed This Visit       Digestive   * (Principal) Stercoral colitis - Primary   Other Visit Diagnoses       Urinary retention         AKI (acute kidney injury) (HCC)         SOB (shortness of breath)         Acute respiratory failure with hypercapnia (HCC)                       Final Clinical Impression(s) / ED Diagnoses Final diagnoses:  Stercoral colitis  Urinary retention  AKI (acute kidney injury) (HCC)  SOB (shortness of breath)  Acute respiratory failure with hypercapnia [facility])    Rx / DC Orders ED Discharge Orders     None         Teala Daffron, Vonzella Guernsey, MD 01/16/24 0206    Rory Collard, MD 01/16/24 [phone]

## 2024-01-16 NOTE — Progress Notes (Signed)
  Echocardiogram 2D Echocardiogram has been attempted. Unable to fit machine in room with Bipap machine   Fain Home RDCS 01/16/2024, 1:48 PM

## 2024-01-16 NOTE — Progress Notes (Addendum)
 Same day note  Selena Bush is a 78 y.o. female with medical history significant for Parkinson's disease, history of CVA (2024), paroxysmal A-fib on Eliquis , hypertension, hyperlipidemia, type 2 diabetes, OSA on CPAP, presented to the hospital with decreased urine output despite increase dose of her diuretics by her cardiologist 2 days ago.  Patient also reported constipation for more than 8 days.  In the ED urinalysis was negative for pyuria but had urinary retention and Foley catheter was placed.  CT scan of the abdomen and pelvis showed diffusely filled colon consistent with constipation.  There was some evidence of stercoral colitis.  Bilateral perihilar infiltrates were also noted.   In the ED patient was somnolent with abnormal VBG with pH of 7.2 and PCO2 of 88.  Labs were notable for sodium of 132 with creatinine of 1.1.  Patient was put on BiPAP for CO2 narcosis.  Patient was then admitted hospital for further evaluation and treatment.   Patient seen and examined at bedside.  Patient was admitted to the hospital for abdominal distention constipation  At the time of my evaluation, patient complains of abdominal distention and constipation for several days.  Has shortness of breath and dyspnea but denies any chest pain cough or fever.  On BiPAP.  Physical examination reveals elderly female, deconditioned and weak on BiPAP.  Abdominal distention noted.  Laboratory data and imaging was reviewed  Assessment and Plan.  Stercoral colitis Severe constipation. Constipation for 8 days.  Continue bowel regimen.  CT scan showing severe constipation.  Will add soapsuds enema.  Focus on bowel movements.   Acute on chronic hypoxic and hypercarbic respiratory failure Patient uses CPAP nightly for sleep apnea.  Required BiPAP for CO2 narcosis.  Unclear whether patient uses CPAP at home since he lives with a roommate and feels home health services.   Acute metabolic encephalopathy secondary to CO2  narcosis VBG pH 7.26 with pCO2 88.9.  Continue BiPAP.  Supposed be on CPAP at home.  CT head without any acute findings.  Might benefit from getting overnight pulse oximetry and ABG in morning to discern whether she might benefit from BiPAP at home.   Paroxysmal A-fib on Eliquis  Currently n.p.o. show on Will Lovenox .  On Eliquis  at home.  Parkinson's disease Resume home regimen when more alert and no longer n.p.o. usually takes medications with applesauce.  Check a speech therapy.  Hypotension Received albumin.  Likely secondary to autonomic dysfunction from Parkinson's disease.  Sinus bradycardia Avoid AV nodal blockade agents.  Continue to monitor   Acute on chronic chronic HFpEF with grade 2 diastolic dysfunction BNP greater than 400.  Chest x-ray shows some vascular congestion.  Blood pressure was low on admission.  Continue strict intake and output charting.  Check 2D echocardiogram.  Might need diuretics when blood pressure is better.   Acute urinary retention Foley catheter placed on 01/16/2024 continue to monitor urinary output.  Likely secondary to constipation.   Generalized weakness Will obtain PT OT evaluation.   Hyperlipidemia Resume home Zetia  when no longer n.p.o.   Chronic anxiety and depression Resume home regimen when no longer n.p.o.   Polyneuropathy Resume home gabapentin  when no longer n.p.o.   QTc prolongation QTc on twelve-lead EKG 537.  Will continue to monitor electrolytes.  Spoke with the patient's caregiver and friend at bedside.  No Charge  Signed,  Lindwood Rhody, MD [facility]

## 2024-01-16 NOTE — Progress Notes (Signed)
 MEWS Progress Note  Patient Details Name: NIVEAH BOERNER MRN: [id number] DOB: 1946-04-04 Today's Date: 01/16/2024   MEWS Flowsheet Documentation:  Assess: MEWS Score Temp: (!) 101.3 F (38.5 C) BP: (!) 98/47 MAP (mmHg): (!) 63 Pulse Rate: 64 ECG Heart Rate: 67 Resp: 12 Level of Consciousness: Responds to Pain SpO2: 100 % O2 Device: Bi-PAP O2 Flow Rate (L/min): 2 L/min FiO2 (%): 30 % Assess: MEWS Score MEWS Temp: 1 MEWS Systolic: 1 MEWS Pulse: 0 MEWS RR: 1 MEWS LOC: 2 MEWS Score: 5 MEWS Score Color: Red Assess: SIRS CRITERIA SIRS Temperature : 1 SIRS Respirations : 0 SIRS Pulse: 0 SIRS WBC: 0 SIRS Score Sum : 1 Assess: if the MEWS score is Yellow or Red Were vital signs accurate and taken at a resting state?: Yes Does the patient meet 2 or more of the SIRS criteria?: Yes Does the patient have a confirmed or suspected source of infection?: No MEWS guidelines implemented : Yes, red Treat MEWS Interventions: Considered administering scheduled or prn medications/treatments as ordered Take Vital Signs Increase Vital Sign Frequency : Red: Q1hr x2, continue Q4hrs until patient remains green for 12hrs Escalate MEWS: Escalate: Red: Discuss with charge nurse and notify provider. Consider notifying RRT. If remains red for 2 hours consider need for higher level of care Notify: Charge Nurse/RN Name of Charge Nurse/RN Notified: Zoila Hines, RN Provider Notification Provider Name/Title: Michell Ahumada Date Provider Notified: 01/16/24 Time Provider Notified: [id number] Method of Notification: Page Notification Reason: Other (Comment) (abnormal vital signs) Notify: Rapid Response Name of Rapid Response RN Notified: Myrtie Atkinson, RN Date Rapid Response Notified: 01/16/24 Time Rapid Response Notified: [id number]      Joesph Mussel 01/16/2024, 9:29 PM

## 2024-01-16 NOTE — Progress Notes (Signed)
 Critical ABG results given to Dr Efrain Grant at 0842 pH 7.36 pCO2  68.7 pO2  106 HCO3 39.2

## 2024-01-16 NOTE — Progress Notes (Signed)
 [facility] night cross cover note:   I was notified by the patient's RN of the patient's updated VBG results this AM.   Per chart review, this is a 78 year old female who was admitted overnight, including for acute on chronic hypoxic hypercapnic respiratory failure.  Initial I-STAT abg (off of bipap) showed 7.263/88.9. at the time she reported to not respond to any verbal or tactile stimuli.   Was subsequently started on BiPAP (15/5) with vbg on these settings showing  7.24/95. However, per my discussions with the pt's RN and RT, the patient is now more responsive,  responding to tactile stimuli,, able to offer one-word responses to questions posed to her.   Bipap settings then changed to 25/5 by RT. iSTAT ABG performed 20 minutes after these updated settings is now showing some improvement: 7.316/74.1.   With Blood gas appearing to be improving on BiPAP, and patient becoming more responsive in correlation with this, will continue bipap and I will order another abg to be checked on day shift.      Camelia Cavalier, DO Hospitalist

## 2024-01-16 NOTE — H&P (Addendum)
 History and Physical  Selena Bush JKK:[id number] DOB: 23-Oct-1945 DOA: 01/15/2024  Referring physician: Dr. Carylon Claude, EDP  PCP: [facility]  Outpatient Specialists: Cardiology, pulmonary. Patient coming from: Home.  Chief Complaint: Urinary retention and severe constipation  HPI: Selena Bush is a 78 y.o. female with medical history significant for Parkinson's disease, history of CVA (2024), paroxysmal A-fib on Eliquis , hypertension, hyperlipidemia, type 2 diabetes, OSA on CPAP, who presents to the ER due to decrease urine output despite increase dose of her diuretics by her cardiologist 2 days ago.  Reportedly the patient has had constipation, more than 8 days without a bowel movement.  In the ER, UA was negative for pyuria.  The patient had acute urinary retention for which a Foley catheter was placed.  A CT abdomen and pelvis without contrast revealed diffusely stool-filled colon consistent with constipation.  Stool-filled rectum with rectal wall thickening suggesting stercoral colitis.  Bilateral perihilar infiltrates suggesting pneumonia or edema, cardiac enlargement.  BNP elevated > 400.  Troponin also elevated and downtrending.  Hypersomnolent in the ER, venous blood gas revealed respiratory acidosis with pH VBG of 7.263 and PCO2 of 88.  The patient was placed on BiPAP for CO2 narcosis, she is able to protect her airway.  [facility], hospitalist service, was asked to admit.  At the time of this visit, the patient is somnolent.  Her daughter is present at bedside.  ED Course: Temperature 98.6.  BP 83/55, pulse 85, respiration rate 13, O2 saturation 95% on BiPAP with FiO2 of 30%.  Lab studies notable for serum sodium 132, creatinine 1.13 with GFR 51.  Albumin 3.4, BNP 413.  Hemoglobin 10.9.  Review of Systems: Review of systems as noted in the HPI. All other systems reviewed and are negative.   Past Medical History:  Diagnosis Date   Anxiety    Atrial  fibrillation (HCC)    Cat bite of right hand 03/25/2019   Chronic diastolic CHF (congestive heart failure) (HCC)    a. echo 09/2014: EF 60-65%, diastolic dysfunction, mild LVH, nl RV size & systolic function, mildly dilated LA (4.3 cm), mild MR/TR, mildly elevated PASP 36.7 mm Hg   DDD (degenerative disc disease), cervical    DDD (degenerative disc disease), lumbar    Depression    Diffuse cystic mastopathy 2014   Dyspnea    Dysrhythmia    GERD (gastroesophageal reflux disease)    Gout    Headache    rare   HLD (hyperlipidemia)    a. statin intolerant 2/2 myalgias   Hx of dysplastic nevus 12/25/2018   L medial ankle   Hypercholesterolemia    Hypertension    Malignant neoplasm of upper-outer quadrant of female breast (HCC) 10/2012   Papillary DCIS, sentinel node negative. DR/PR positive. PARTIAL RIGHT MASTECTOMY FOR BREAST CANCER--HAD RADIATION - NO CHEMO --DR. CRYSTAL [place] ONCOLOGIST   Obesity    OSA on CPAP    Osteoarthritis of both knees    a. s/p right TKA 04/2013 & left TKA 09/2014   Otitis externa    Parkinson disease [facility])    Personal history of radiation therapy 2015   RIGHT breast-mammosite per pt   Pneumonia of both lungs due to infectious organism 01/08/2021   Sleep difficulties    LUNESTA HAS HELPED   Vaginal cyst    Past Surgical History:  Procedure Laterality Date   ABDOMINAL HYSTERECTOMY  1992   DUB; fibroids; endometriosis.  One remaining ovary.  BREAST LUMPECTOMY Right 2015   Papillary DCIS, sentinel node negative. DR/PR positive. PARTIAL RIGHT MASTECTOMY FOR BREAST CANCER--HAD RADIATION - NO CHEMO --DR. CRYSTAL [place] ONCOLOGIST   BREAST SURGERY Right 10/2012   Wide excision,APB RT 10 mm papillary DCIS, ER/PR positive. Sentinel node negative. Partial breast radiation.   CATARACT EXTRACTION, BILATERAL  02/13/2016   [facility].   CHOLECYSTECTOMY  1994   COLONOSCOPY  2015   1 benign polyp-every 5 years/ Dr Janine Melbourne   COLONOSCOPY WITH PROPOFOL  N/A 02/10/2021    Procedure: COLONOSCOPY WITH PROPOFOL ;  Surgeon: Marshall Skeeter, MD;  Location: [facility] ENDOSCOPY;  Service: Endoscopy;  Laterality: N/A;   ERCP  1995   JOINT REPLACEMENT Right 04/2013   knee   PERICARDIOCENTESIS N/A 11/13/2017   Procedure: PERICARDIOCENTESIS;  Surgeon: Sammy Crisp, MD;  Location: [facility] INVASIVE CV LAB;  Service: Cardiovascular;  Laterality: N/A;   TOTAL KNEE ARTHROPLASTY Right 04/16/2013   Procedure: RIGHT TOTAL KNEE ARTHROPLASTY;  Surgeon: Bevin Bucks, MD;  Location: [facility] ORS;  Service: Orthopedics;  Laterality: Right;   TOTAL KNEE ARTHROPLASTY Left 09/15/2014   Procedure: LEFT TOTAL KNEE ARTHROPLASTY;  Surgeon: Bevin Bucks, MD;  Location: [facility] ORS;  Service: Orthopedics;  Laterality: Left;   TUBAL LIGATION  1979   UPPER GI ENDOSCOPY      Social History:  reports that she has never smoked. She has never used smokeless tobacco. She reports that she does not drink alcohol and does not use drugs.   Allergies  Allergen Reactions   Penicillins Anaphylaxis    anaphylaxis  Has patient had a PCN reaction causing immediate rash, facial/tongue/throat swelling, SOB or lightheadedness with hypotension: Yes Has patient had a PCN reaction causing severe rash involving mucus membranes or skin necrosis: No Has patient had a PCN reaction that required hospitalization: No Has patient had a PCN reaction occurring within the last 10 years: No If all of the above answers are "NO", then may proceed with Cephalosporin use.    Sulfa Antibiotics Anaphylaxis   Codeine Other (See Comments) and Nausea And Vomiting    Gi problems    Statins Other (See Comments)    Leg pains   Xarelto  [Rivaroxaban ] Swelling    pericarditis   Aspirin  Other (See Comments)    "burned my stomach intensely" Abdominal pain and burning    Family History  Problem Relation Age of Onset   Colon cancer Mother    Cancer Mother 12       colon cancer   Melanoma Father    Cancer Father 75       melanoma    Melanoma Sister    Melanoma Sister    Colon cancer Maternal Grandfather    Breast cancer Maternal Grandfather       Prior to Admission medications   Medication Sig Start Date End Date Taking? Authorizing Provider  acetaminophen  (TYLENOL ) 500 MG tablet Take 500 mg by mouth every 6 (six) hours as needed for moderate pain.    [provider]  apixaban  (ELIQUIS ) 5 MG TABS tablet Take 1 tablet (5 mg total) by mouth 2 (two) times daily. 02/21/23   Walker, Caitlin S, NP  bisacodyl  (DULCOLAX) 5 MG EC tablet Take 5 mg by mouth daily as needed for moderate constipation.    [provider]  Carbidopa -Levodopa  ER (RYTARY ) 36.25-145 MG CPCR 3 capsules at 7am/11am/4pm and 1 capsule at bed Patient taking differently: 3 capsules at 7Am 12 pm and 5 pm1 capsule at bed 04/26/23   Tat,  Von Grumbling, DO  Cholecalciferol (VITAMIN D -3) 25 MCG (1000 UT) CAPS Take 1 capsule by mouth daily.    [provider]  Cyanocobalamin  (VITAMIN B-12) 5000 MCG SUBL Place 1 each under the tongue daily.    [provider]  DULoxetine  (CYMBALTA ) 60 MG capsule TAKE ONE CAPSULE BY MOUTH TWICE DAILY FOR anxiety AND depression Patient taking differently: Take 60 mg by mouth 2 (two) times daily. TAKE ONE CAPSULE BY MOUTH TWICE DAILY FOR anxiety AND depression 04/19/22   Clark, Katherine K, NP  ezetimibe  (ZETIA ) 10 MG tablet TAKE ONE TABLET BY MOUTH EVERY MORNING Patient taking differently: Take 10 mg by mouth daily. 06/23/21   Lenise Quince, MD  fluticasone  (FLONASE ) 50 MCG/ACT nasal spray Place 2 sprays into both nostrils daily as needed for rhinitis or allergies. 07/21/21   Clark, Katherine K, NP  gabapentin  (NEURONTIN ) 600 MG tablet Take 600 mg by mouth as needed.    [provider]  Melatonin 5 MG CHEW Chew 10 mg by mouth at bedtime.    [provider]  Multiple Vitamins-Minerals (CEROVITE SENIOR PO) Take 1 tablet by mouth daily.    [provider]  NONFORMULARY OR  COMPOUNDED ITEM Dispense 1 custom manual wheelchair evaluation for improved positioning and pressure distribution. G20.A1 05/02/23   Tat, Von Grumbling, DO  oxybutynin  (DITROPAN ) 5 MG tablet Take 5 mg by mouth 2 (two) times daily. 11/01/21   [provider]  potassium chloride  SA (KLOR-CON  M) 20 MEQ tablet Take 1 tablet (20 mEq total) by mouth daily. 12/25/23   Walker, Caitlin S, NP  sotalol  (BETAPACE ) 120 MG tablet TAKE ONE TABLET BY MOUTH TWICE DAILY Patient taking differently: Take 120 mg by mouth 2 (two) times daily. 05/26/21   Lenise Quince, MD  torsemide  (DEMADEX ) 20 MG tablet Take 40mg  (two tablets) for 3 days, then return to 20mg  (1 tablet) daily w/ an extra 20mg  as needed for edema, swelling, weight gain of 2 pounds overnight or 5 pounds in one week. 12/27/23   Clearnce Curia, NP    Physical Exam: BP (!) 102/55   Pulse (!) 55   Temp 98.6 F (37 C) (Oral)   Resp 10   SpO2 95%   General: 78 y.o. year-old female well developed well nourished in no acute distress.  Hypersomnolent. Cardiovascular: Bradycardic with no rubs or gallops.  No thyromegaly or JVD noted.   Bilateral lower extremity edema, right greater than left.   Respiratory: Diffuse rales bilaterally.  Poor inspiratory effort. Abdomen: Soft nontender nondistended with normal bowel sounds x4 quadrants. Muskuloskeletal: No cyanosis or clubbing noted bilaterally Neuro: CN II-XII intact, strength, sensation, reflexes Skin: No ulcerative lesions noted or rashes Psychiatry: Unable to assess judgment and mood due to hypersomnolence.         Labs on Admission:  Basic Metabolic Panel: Recent Labs  Lab 01/15/24 2224 01/16/24 0148  NA 132* 134*  K 4.2 3.7  CL 90*  --   CO2 28  --   GLUCOSE 103*  --   BUN 14  --   CREATININE 1.13*  --   CALCIUM 9.6  --    Liver Function Tests: Recent Labs  Lab 01/15/24 2224  AST 21  ALT QUANTITY NOT SUFFICIENT, UNABLE TO PERFORM TEST  ALKPHOS 47  BILITOT 0.8  PROT  QUANTITY NOT SUFFICIENT, UNABLE TO PERFORM TEST  ALBUMIN 3.4*   Recent Labs  Lab 01/15/24 2224  LIPASE 30   No results for input(s): "  AMMONIA" in the last 168 hours. CBC: Recent Labs  Lab 01/15/24 2224 01/16/24 0148  WBC 8.9  --   HGB 13.1 10.9*  HCT 39.2 32.0*  MCV 92.7  --   PLT 161  --    Cardiac Enzymes: No results for input(s): "CKTOTAL", "CKMB", "CKMBINDEX", "TROPONINI" in the last 168 hours.  BNP (last 3 results) Recent Labs    12/22/23 1533 01/16/24 0054  BNP 135.4* 413.0*    ProBNP (last 3 results) No results for input(s): "PROBNP" in the last 8760 hours.  CBG: No results for input(s): "GLUCAP" in the last 168 hours.  Radiological Exams on Admission: CT ABDOMEN PELVIS WO CONTRAST Result Date: 01/16/2024 CLINICAL DATA:  Acute nonlocalized abdominal pain. EXAM: CT ABDOMEN AND PELVIS WITHOUT CONTRAST TECHNIQUE: Multidetector CT imaging of the abdomen and pelvis was performed following the standard protocol without IV contrast. RADIATION DOSE REDUCTION: This exam was performed according to the departmental dose-optimization program which includes automated exposure control, adjustment of the mA and/or kV according to patient size and/or use of iterative reconstruction technique. COMPARISON:  Ultrasound kidneys 01/04/2024 FINDINGS: Lower chest: Motion artifact limits examination. Bilateral perihilar infiltrates likely representing pneumonia or edema. Mild cardiac enlargement. Hepatobiliary: No focal liver abnormality is seen. Status post cholecystectomy. No biliary dilatation. Pancreas: Unremarkable. No pancreatic ductal dilatation or surrounding inflammatory changes. Spleen: Normal in size without focal abnormality. Adrenals/Urinary Tract: Adrenal glands are unremarkable. Kidneys are normal, without renal calculi, focal lesion, or hydronephrosis. Bladder is decompressed with a Foley catheter. Stomach/Bowel: Stomach, small bowel, and colon are not abnormally distended. The  colon is diffusely stool-filled. Stool-filled rectum with rectal wall thickening may indicate stercoral colitis. Appendix is normal. Vascular/Lymphatic: Aortic atherosclerosis. No enlarged abdominal or pelvic lymph nodes. Reproductive: Status post hysterectomy. No adnexal masses. Other: No free air or free fluid in the abdomen. Diastasis or laxity of the anterior abdominal wall musculature without true herniation. Musculoskeletal: Degenerative changes in the spine. No acute bony abnormalities. IMPRESSION: 1. Diffusely stool-filled colon consistent with constipation. Stool-filled rectum with rectal wall thickening suggesting stercoral colitis. 2. Aortic atherosclerosis. 3. Bilateral perihilar infiltrates suggesting pneumonia or edema. Cardiac enlargement. Electronically Signed   By: Boyce Byes M.D.   On: 01/16/2024 00:27   DG Chest 2 View Result Date: 01/15/2024 CLINICAL DATA:  SOB CHF EXAM: CHEST - 2 VIEW COMPARISON:  11/07/2023. FINDINGS: Elevated right hemidiaphragm. Pulmonary vascular congestion. No pneumothorax. Enlarged cardiac silhouette. Thoracic degenerative changes. Calcified aorta. Thoracic degenerative disc disease. IMPRESSION: Enlarged cardiac silhouette with vascular congestion. Electronically Signed   By: Sydell Eva M.D.   On: 01/15/2024 22:12    EKG: I independently viewed the EKG done and my findings are as followed: Sinus bradycardia rate of 46.  Nonspecific ST-T changes.  QTc 537.  Assessment/Plan Present on Admission:  Stercoral colitis  Principal Problem:   Stercoral colitis  Stercoral colitis Constipation, 8 days without bowel movement, prior to admission Bowel regimen when hemodynamically stable Consider GI involvement in the morning when the patient is stable  Acute on chronic hypoxic and hypercarbic respiratory failure Uses a CPAP nightly for OSA at baseline Currently on BiPAP for CO2 narcosis Maintain O2 saturation above 90% Wean off O2 supplementation as  tolerated Unclear if compliant with home CPAP-the patient lives at home with a roommate, and receives home health services.  Acute metabolic encephalopathy secondary to CO2 narcosis VBG pH 7.26 with pCO2 88.9 Continue BiPAP Repeat VBG in the morning Follow head CT  Paroxysmal A-fib on Eliquis  Hold  off Eliquis  while n.p.o. Pharmacy consulted for full dose subcu Lovenox  while n.p.o.  Parkinson's disease Resume home regimen when more alert and no longer n.p.o. The patient takes her medications with applesauce, per her daughter at bedside. Speech therapist consulted for swallow evaluation.  Hypotension No clear evidence of active infective process Possibly from autonomic dysfunction in the setting of Parkinson's disease Maintain MAP greater than 65 Received IV albumin while n.p.o Closely monitor vital signs.  Sinus bradycardia Avoid AV nodal blockade agents Midodrine can cause reflex bradycardia Closely monitor on telemetry.  Acute on chronic chronic HFpEF with grade 2 diastolic dysfunction BNP greater than 400 Findings suggestive of pulmonary edema on chest x-ray Diurese as blood pressure allows, currently blood pressure is too low for diuresing. Monitor strict I's and O's and daily weight Follow 2D echo  Acute urinary retention Foley catheter placed on 01/16/2024 Monitor urine output Consider voiding trial prior to discharge from the hospital.  Generalized weakness PT OT evaluation Fall precautions.  Hyperlipidemia Resume home Zetia  when no longer n.p.o.  Chronic anxiety and depression Resume home regimen when no longer n.p.o.  Polyneuropathy Resume home gabapentin  when no longer n.p.o.  QTc prolongation QTc on twelve-lead EKG 537. Avoid QTc prolonging agents Optimize magnesium  and potassium levels. Closely monitor with telemetry on stepdown/progressive care unit.    Critical care time: 55 minutes.    DVT prophylaxis: Home Eliquis  on hold due to NPO.   Full dose subcu Lovenox   Code Status: Full code.  Family Communication: Updated the patient's daughter at bedside.  Disposition Plan: Admitted to progressive care unit.  Consults called: None.  Consider cardiology's consultation in the morning.  Admission status: Inpatient status.   Status is: Inpatient The patient requires at least 2 midnights for further evaluation and treatment of present condition.   Bary Boss MD [facility] Pager [phone]  If 7PM-7AM, please contact night-coverage [email or web address] Password [email or web address]  01/16/2024, 2:20 AM

## 2024-01-16 NOTE — Progress Notes (Signed)
 SLP Cancellation Note  Patient Details Name: Selena Bush MRN: [id number] DOB: [birth year]   Cancelled treatment:       Reason Eval/Treat Not Completed: Medical issues which prohibited therapy. Pt on Bipap. Will f/u when ready for swallow eval. Spoke to daughter   Valeri Gate 01/16/2024, 10:11 AM

## 2024-01-16 NOTE — ED Notes (Signed)
 Provider informed of pt status, VS, and VBG results [id number], Dr. Del Favia suggesting to call rapid response and floor coverage, Dr Brock Canner paged. This RN and RT at bedside and discussed plan with Dr. Brock Canner via phone call, cbc recollected and sent, abg obtained by [facility] RT. Rapid RN contacted. Pt remains on bipap and is more arousable than  when initially placed on bipap. VSS.

## 2024-01-16 NOTE — Progress Notes (Signed)
 Overnight floor coverage  Patient is febrile tonight and started on doxycycline  for pneumonia coverage given bilateral perihilar infiltrates seen on CT abdomen pelvis done earlier today.  Discussed with pharmacist, patient is allergic to penicillins and has not received cephalosporins in the past.  Avoiding fluoroquinolones due to risk of QT prolongation given sotalol  use.  Blood cultures ordered.  Tylenol  as needed for fevers.  Check procalcitonin level.  MRSA PCR screen.

## 2024-01-16 NOTE — Progress Notes (Signed)
 Pt remains on Bipap at this time, overnight pulse oximetry not done.

## 2024-01-16 NOTE — Progress Notes (Signed)
 Pt transported from ED 18 to CT on BIPAP and back with no complications.

## 2024-01-16 NOTE — ED Notes (Signed)
 Warm blankets applied to pt and provider updated on vs. Pt transported to ct scan by primary RN and RT [place], pt on portable monitor

## 2024-01-16 NOTE — Progress Notes (Signed)
 PHARMACY - ANTICOAGULATION CONSULT NOTE  Pharmacy Consult for lovenox  (apixaban  PTA) Indication: atrial fibrillation  Allergies  Allergen Reactions   Penicillins Anaphylaxis    anaphylaxis  Has patient had a PCN reaction causing immediate rash, facial/tongue/throat swelling, SOB or lightheadedness with hypotension: Yes Has patient had a PCN reaction causing severe rash involving mucus membranes or skin necrosis: No Has patient had a PCN reaction that required hospitalization: No Has patient had a PCN reaction occurring within the last 10 years: No If all of the above answers are "NO", then may proceed with Cephalosporin use.    Sulfa Antibiotics Anaphylaxis   Codeine Other (See Comments) and Nausea And Vomiting    Gi problems    Statins Other (See Comments)    Leg pains   Xarelto  [Rivaroxaban ] Swelling    pericarditis   Aspirin  Other (See Comments)    "burned my stomach intensely" Abdominal pain and burning    Patient Measurements: Height: 5\' 3"  (160 cm) Weight: 60.3 kg (133 lb) (weight from cards appointment 11/2023) IBW/kg (Calculated) : 52.4  Vital Signs: Temp: 96.7 F (35.9 C) (06/03 0321) Temp Source: Axillary (06/03 0321) BP: 85/48 (06/03 0430) Pulse Rate: 50 (06/03 0430)  Labs: Recent Labs    01/15/24 2224 01/16/24 0055 01/16/24 0148 01/16/24 0220  HGB 13.1  --  10.9*  --   HCT 39.2  --  32.0*  --   PLT 161  --   --   --   CREATININE 1.13*  --   --   --   TROPONINIHS  --  9  --  9    Estimated Creatinine Clearance: 35.6 mL/min (A) (by C-G formula based on SCr of 1.13 mg/dL (H)).   Medical History: Past Medical History:  Diagnosis Date   Anxiety    Atrial fibrillation (HCC)    Cat bite of right hand 03/25/2019   Chronic diastolic CHF (congestive heart failure) (HCC)    a. echo 09/2014: EF 60-65%, diastolic dysfunction, mild LVH, nl RV size & systolic function, mildly dilated LA (4.3 cm), mild MR/TR, mildly elevated PASP 36.7 mm Hg   DDD  (degenerative disc disease), cervical    DDD (degenerative disc disease), lumbar    Depression    Diffuse cystic mastopathy 2014   Dyspnea    Dysrhythmia    GERD (gastroesophageal reflux disease)    Gout    Headache    rare   HLD (hyperlipidemia)    a. statin intolerant 2/2 myalgias   Hx of dysplastic nevus 12/25/2018   L medial ankle   Hypercholesterolemia    Hypertension    Malignant neoplasm of upper-outer quadrant of female breast (HCC) 10/2012   Papillary DCIS, sentinel node negative. DR/PR positive. PARTIAL RIGHT MASTECTOMY FOR BREAST CANCER--HAD RADIATION - NO CHEMO --DR. CRYSTAL Ruthton ONCOLOGIST   Obesity    OSA on CPAP    Osteoarthritis of both knees    a. s/p right TKA 04/2013 & left TKA 09/2014   Otitis externa    Parkinson disease [facility])    Personal history of radiation therapy 2015   RIGHT breast-mammosite per pt   Pneumonia of both lungs due to infectious organism 01/08/2021   Sleep difficulties    LUNESTA HAS HELPED   Vaginal cyst     Medications:  (Not in a hospital admission)   Assessment: 49 YOF w/ a Hx of AF on apixaban  PTA (last dose unknown at this time) Weight was obtained from an office visit with cardiology dated  11/2023 Goal of Therapy:    Monitor platelets by anticoagulation protocol: Yes   Plan:  Lovenox  60 mg [place] q12h. Start w/ AM dose 01/16/2024 Monitor for signs of bleeding F/up restart of DOAC when appropriate   Marleta Simmer BS, PharmD, BCPS Clinical Pharmacist 01/16/2024 4:52 AM  Contact: [phone] after 3 PM  "Be curious, not judgmental..." -Rumalda Counter

## 2024-01-16 NOTE — Progress Notes (Signed)
 Selena Bush [id number] Admission Data: 01/16/2024 5:41 PM Attending Provider: Rosena Conradi, MD  PCP:[facility] Consults/ Treatment Team:   [name] is a 78 y.o. female patient admitted from ED awake, alert  & orientated  X 3,  Full Code, VSS - Blood pressure (!) 89/61, pulse 61, temperature 99.7 F (37.6 C), temperature source Axillary, resp. rate 16, height 5\' 1"  (1.549 m), weight 66 kg, SpO2 94%., O2  . Tele # placed and pt is currently running:normal sinus rhythm.   IV site WDL:  forearm with a transparent dsg that's clean dry and intact.      Will cont to monitor and assist as needed.  Chanda Combes, RN 01/16/2024 5:41 PM

## 2024-01-17 ENCOUNTER — Ambulatory Visit: Payer: Medicare (Managed Care) | Admitting: Student

## 2024-01-17 ENCOUNTER — Other Ambulatory Visit (HOSPITAL_COMMUNITY): Payer: Medicare (Managed Care)

## 2024-01-17 ENCOUNTER — Inpatient Hospital Stay (HOSPITAL_COMMUNITY): Payer: Medicare (Managed Care)

## 2024-01-17 DIAGNOSIS — K5289 Other specified noninfective gastroenteritis and colitis: Secondary | ICD-10-CM | POA: Diagnosis not present

## 2024-01-17 DIAGNOSIS — I5031 Acute diastolic (congestive) heart failure: Secondary | ICD-10-CM

## 2024-01-17 LAB — BLOOD CULTURE ID PANEL (REFLEXED) - BCID2

## 2024-01-17 LAB — POCT I-STAT 7, (LYTES, BLD GAS, ICA,H+H)
Acid-Base Excess: 12 mmol/L — ABNORMAL HIGH (ref 0.0–2.0)
Bicarbonate: 35.8 mmol/L — ABNORMAL HIGH (ref 20.0–28.0)
Calcium, Ion: 1.17 mmol/L (ref 1.15–1.40)
HCT: 25 % — ABNORMAL LOW (ref 36.0–46.0)
Hemoglobin: 8.5 g/dL — ABNORMAL LOW (ref 12.0–15.0)
O2 Saturation: 99 %
Patient temperature: 97.4
Potassium: 3.3 mmol/L — ABNORMAL LOW (ref 3.5–5.1)
Sodium: 134 mmol/L — ABNORMAL LOW (ref 135–145)
TCO2: 37 mmol/L — ABNORMAL HIGH (ref 22–32)
pCO2 arterial: 41.6 mmHg (ref 32–48)
pH, Arterial: 7.541 — ABNORMAL HIGH (ref 7.35–7.45)
pO2, Arterial: 128 mmHg — ABNORMAL HIGH (ref 83–108)

## 2024-01-17 LAB — PROCALCITONIN: Procalcitonin: <0.1 ng/mL

## 2024-01-17 LAB — BASIC METABOLIC PANEL WITH GFR
Anion gap: 11 (ref 5–15)
BUN: 15 mg/dL (ref 8–23)
CO2: 30 mmol/L (ref 22–32)
Calcium: 9.2 mg/dL (ref 8.9–10.3)
Chloride: 93 mmol/L — ABNORMAL LOW (ref 98–111)
Creatinine, Ser: 1.2 mg/dL — ABNORMAL HIGH (ref 0.44–1.00)
GFR, Estimated: 47 mL/min — ABNORMAL LOW (ref >60)
Glucose, Bld: 109 mg/dL — ABNORMAL HIGH (ref 70–99)
Potassium: 3.4 mmol/L — ABNORMAL LOW (ref 3.5–5.1)
Sodium: 134 mmol/L — ABNORMAL LOW (ref 135–145)

## 2024-01-17 LAB — CBC
HCT: 28.9 % — ABNORMAL LOW (ref 36.0–46.0)
Hemoglobin: 9.3 g/dL — ABNORMAL LOW (ref 12.0–15.0)
MCH: 30.7 pg (ref 26.0–34.0)
MCHC: 32.2 g/dL (ref 30.0–36.0)
MCV: 95.4 fL (ref 80.0–100.0)
Platelets: 190 10*3/uL (ref 150–400)
RBC: 3.03 MIL/uL — ABNORMAL LOW (ref 3.87–5.11)
RDW: 13.1 % (ref 11.5–15.5)
WBC: 7.7 10*3/uL (ref 4.0–10.5)
nRBC: 0 % (ref 0.0–0.2)

## 2024-01-17 LAB — ECHOCARDIOGRAM COMPLETE
Height: 61 in
S' Lateral: 2.4 cm
Weight: 2313.95 [oz_av]

## 2024-01-17 LAB — MAGNESIUM: Magnesium: 1.8 mg/dL (ref 1.7–2.4)

## 2024-01-17 MED ORDER — ACETAMINOPHEN 650 MG RE SUPP
650.0000 mg | RECTAL | Status: DC | PRN
  Administered 2024-01-17 – 2024-01-18 (×3): 650 mg via RECTAL
  Filled 2024-01-17 (×3): qty 1

## 2024-01-17 MED ORDER — POTASSIUM CHLORIDE 2 MEQ/ML IV SOLN
INTRAVENOUS | Status: DC
  Filled 2024-01-17 (×3): qty 1000

## 2024-01-17 MED ORDER — CARBIDOPA-LEVODOPA ER 36.25-145 MG PO CPCR
1.0000 | ORAL_CAPSULE | Freq: Every day | ORAL | Status: DC
  Filled 2024-01-17: qty 3

## 2024-01-17 MED ORDER — METRONIDAZOLE 500 MG/100ML IV SOLN
500.0000 mg | Freq: Two times a day (BID) | INTRAVENOUS | Status: DC
  Administered 2024-01-17 – 2024-01-19 (×4): 500 mg via INTRAVENOUS
  Filled 2024-01-17 (×4): qty 100

## 2024-01-17 MED ORDER — VANCOMYCIN HCL IN DEXTROSE 1-5 GM/200ML-% IV SOLN
1000.0000 mg | Freq: Once | INTRAVENOUS | Status: AC
  Administered 2024-01-17: 1000 mg via INTRAVENOUS
  Filled 2024-01-17: qty 200

## 2024-01-17 MED ORDER — DILTIAZEM HCL-DEXTROSE 125-5 MG/125ML-% IV SOLN (PREMIX)
5.0000 mg/h | INTRAVENOUS | Status: DC
  Administered 2024-01-17: 15 mg/h via INTRAVENOUS
  Administered 2024-01-17: 10 mg/h via INTRAVENOUS
  Administered 2024-01-17: 5 mg/h via INTRAVENOUS
  Administered 2024-01-17: 2.5 mg/h via INTRAVENOUS
  Filled 2024-01-17 (×2): qty 125

## 2024-01-17 MED ORDER — METOPROLOL TARTRATE 5 MG/5ML IV SOLN
2.5000 mg | INTRAVENOUS | Status: DC | PRN
  Administered 2024-01-17: 2.5 mg via INTRAVENOUS
  Filled 2024-01-17: qty 5

## 2024-01-17 MED ORDER — SODIUM CHLORIDE 0.9 % IV SOLN
2.0000 g | Freq: Two times a day (BID) | INTRAVENOUS | Status: DC
  Administered 2024-01-17 – 2024-01-19 (×4): 2 g via INTRAVENOUS
  Filled 2024-01-17 (×4): qty 12.5

## 2024-01-17 MED ORDER — CARBIDOPA-LEVODOPA ER 36.25-145 MG PO CPCR
3.0000 | ORAL_CAPSULE | ORAL | Status: DC
  Administered 2024-01-17: 3 via ORAL
  Filled 2024-01-17 (×4): qty 3

## 2024-01-17 MED ORDER — ONDANSETRON HCL 4 MG/2ML IJ SOLN: 4.0000 mg | Freq: Four times a day (QID) | INTRAMUSCULAR | Status: DC | PRN

## 2024-01-17 MED ORDER — BISACODYL 10 MG RE SUPP
10.0000 mg | Freq: Every day | RECTAL | Status: DC
  Filled 2024-01-17: qty 1

## 2024-01-17 MED ORDER — POTASSIUM CHLORIDE 10 MEQ/100ML IV SOLN
10.0000 meq | INTRAVENOUS | Status: AC
  Administered 2024-01-17 (×4): 10 meq via INTRAVENOUS
  Filled 2024-01-17 (×4): qty 100

## 2024-01-17 MED ORDER — VANCOMYCIN HCL 750 MG/150ML IV SOLN
750.0000 mg | INTRAVENOUS | Status: DC
  Administered 2024-01-19: 750 mg via INTRAVENOUS
  Filled 2024-01-17: qty 150

## 2024-01-17 MED ORDER — CHLORHEXIDINE GLUCONATE CLOTH 2 % EX PADS
6.0000 | MEDICATED_PAD | Freq: Every day | CUTANEOUS | Status: DC
  Administered 2024-01-18 – 2024-01-19 (×2): 6 via TOPICAL

## 2024-01-17 NOTE — Plan of Care (Signed)
   Problem: Safety: Goal: Ability to remain free from injury will improve Outcome: Progressing

## 2024-01-17 NOTE — Progress Notes (Signed)
   01/17/24 0918  Assess: MEWS Score  BP 131/81  Pulse Rate (!) 134  Level of Consciousness Responds to Voice  O2 Device HFNC  O2 Flow Rate (L/min) 6 L/min  Assess: MEWS Score  MEWS Temp 0  MEWS Systolic 0  MEWS Pulse 3  MEWS RR 0  MEWS LOC 1  MEWS Score 4  MEWS Score Color Red  Assess: if the MEWS score is Yellow or Red  Were vital signs accurate and taken at a resting state? Yes  Does the patient meet 2 or more of the SIRS criteria? No  Does the patient have a confirmed or suspected source of infection? No  MEWS guidelines implemented  Yes, red (MD aware)  Treat  MEWS Interventions Considered administering scheduled or prn medications/treatments as ordered  Take Vital Signs  Increase Vital Sign Frequency  Red: Q1hr x2, continue Q4hrs until patient remains green for 12hrs  Escalate  MEWS: Escalate Red: Discuss with charge nurse and notify provider. Consider notifying RRT. If remains red for 2 hours consider need for higher level of care  Assess: SIRS CRITERIA  SIRS Temperature  0  SIRS Respirations  0  SIRS Pulse 1  SIRS WBC 0  SIRS Score Sum  1

## 2024-01-17 NOTE — Progress Notes (Signed)
   01/17/24 1259  Assess: MEWS Score  BP 117/83  MAP (mmHg) 94  Pulse Rate (!) 146  ECG Heart Rate (!) 118  Resp (!) 24  SpO2 97 %  Assess: MEWS Score  MEWS Temp 2  MEWS Systolic 0  MEWS Pulse 2  MEWS RR 1  MEWS LOC 1  MEWS Score 6  MEWS Score Color Red  Assess: SIRS CRITERIA  SIRS Temperature  1  SIRS Respirations  1  SIRS Pulse 1  SIRS WBC 0  SIRS Score Sum  3

## 2024-01-17 NOTE — Progress Notes (Signed)
 Pharmacy Antibiotic Note  Selena Bush is a 78 y.o. female admitted on 01/15/2024 with pneumonia.  Pharmacy has been consulted for vancomycin  dosing.  Plan: Vancomycin  1g IV x 1 now, then 750 mg IV q 36 hrs. Will f/u renal function and clinical course.  Height: 5\' 1"  (154.9 cm) Weight: 65.6 kg (144 lb 10 oz) IBW/kg (Calculated) : 47.8  Temp (24hrs), Avg:100.7 F (38.2 C), Min:97.4 F (36.3 C), Max:102.7 F (39.3 C)  Recent Labs  Lab 01/15/24 2224 01/16/24 0220 01/16/24 0610 01/17/24 0225  WBC 8.9  --  9.0 7.7  CREATININE 1.13* 1.12*  --  1.20*    Estimated Creatinine Clearance: 35.1 mL/min (A) (by C-G formula based on SCr of 1.2 mg/dL (H)).    Allergies  Allergen Reactions   Penicillins Anaphylaxis    anaphylaxis  Has patient had a PCN reaction causing immediate rash, facial/tongue/throat swelling, SOB or lightheadedness with hypotension: Yes Has patient had a PCN reaction causing severe rash involving mucus membranes or skin necrosis: No Has patient had a PCN reaction that required hospitalization: No Has patient had a PCN reaction occurring within the last 10 years: No If all of the above answers are "NO", then may proceed with Cephalosporin use.    Sulfa Antibiotics Anaphylaxis   Codeine Other (See Comments) and Nausea And Vomiting    Gi problems    Statins Other (See Comments)    Leg pains   Xarelto  [Rivaroxaban ] Swelling    pericarditis   Aspirin  Other (See Comments)    "burned my stomach intensely" Abdominal pain and burning    Joanell Mowers, Davey Erp, [facility] Clinical Pharmacist  01/17/2024 1:44 PM   [facility] pharmacy phone numbers are listed on [email or web address]

## 2024-01-17 NOTE — Progress Notes (Signed)
   01/17/24 1221  Assess: MEWS Score  BP 117/75  MAP (mmHg) 88  Pulse Rate (!) 163  ECG Heart Rate (!) 136  Resp (!) 26  SpO2 96 %  O2 Device Nasal Cannula  O2 Flow Rate (L/min) 2 L/min  Assess: MEWS Score  MEWS Temp 2  MEWS Systolic 0  MEWS Pulse 3  MEWS RR 2  MEWS LOC 1  MEWS Score 8  MEWS Score Color Red  Assess: if the MEWS score is Yellow or Red  Were vital signs accurate and taken at a resting state? Yes  Does the patient meet 2 or more of the SIRS criteria? No  Does the patient have a confirmed or suspected source of infection? No  MEWS guidelines implemented  No, previously red, continue vital signs every 4 hours  Assess: SIRS CRITERIA  SIRS Temperature  1  SIRS Respirations  1  SIRS Pulse 1  SIRS WBC 0  SIRS Score Sum  3

## 2024-01-17 NOTE — Progress Notes (Signed)
 OT Cancellation Note  Patient Details Name: Selena Bush MRN: [id number] DOB: February 26, 1946   Cancelled Treatment:    Reason Eval/Treat Not Completed: Medical issues which prohibited therapy (Spoke with RN and pt's family at pt's bedside. Pt with increased somnolence and with continued increased HR with RN requesting OT hold for today. OT to reattempt to see pt at a later time for OT eval as appropriate/available.)  Kiernan Atkerson "Darral Ellis., OTR/L, MA Acute Rehab [phone]  Walt Gunner 01/17/2024, 1:39 PM

## 2024-01-17 NOTE — Progress Notes (Addendum)
 PROGRESS NOTE  Selena Bush ZOX:[id number] DOB: 13-Nov-1945 DOA: 01/15/2024 PCP: [facility]   LOS: 1 day   Brief narrative:   Selena Bush is a 78 y.o. female with medical history significant for Parkinson's disease, history of CVA (2024), paroxysmal A-fib on Eliquis , hypertension, hyperlipidemia, type 2 diabetes, OSA on CPAP, presented to the hospital with decreased urine output despite increase dose of her diuretics by her cardiologist 2 days ago.  Patient also reported constipation for more than 8 days.  In the ED urinalysis was negative for pyuria but had urinary retention and Foley catheter was placed.  CT scan of the abdomen and pelvis showed diffusely filled colon consistent with constipation.  There was some evidence of stercoral colitis.  Bilateral perihilar infiltrates were also noted.   In the ED patient was somnolent with abnormal VBG with pH of 7.2 and PCO2 of 88.  Labs were notable for sodium of 132 with creatinine of 1.1.  Patient was put on BiPAP for CO2 narcosis.  Patient was then admitted hospital for further evaluation and treatment.   Assessment/Plan: Principal Problem:   Stercoral colitis  Stercoral colitis Severe constipation. Constipation for 8 days.  Received enema x 2 yesterday with some improvement in bowel movements.  CT scan showed severe constipation on presentation..  Will continue with laxatives if necessary to keep the bowels moving.   Acute on chronic hypoxic and hypercarbic respiratory failure Patient uses CPAP nightly for sleep apnea.  Initially required BiPAP for CO2 narcosis.  Unclear whether patient uses CPAP at home since he lives with a roommate.  Patient's daughter at bedside.  At this time patient is very lethargic and unable to put BiPAP.  No plans for intubation and escalation of care.  Fever overnight.  Was put on doxycycline .  Possibility of pneumonia.  Had some perihilar infiltrates on the CT scan of the  abdomen and pelvis.  Will broaden antibiotics to vancomycin  cefepime and Flagyl  at this time.  Hypokalemia.  Will replenish with IV KCl.  Check levels in AM.   Acute metabolic encephalopathy secondary to CO2 narcosis Patient very somnolent and lethargic.  VBG pH 7.26 with pCO2 88.9.  ABG also showed hypercarbia initially.  Received BiPAP overnight and ABG improved so was taken off BiPAP.  Patient continues to be very somnolent. CT head without any acute findings.    Paroxysmal A-fib on Eliquis  with RVR Currently n.p.o. continue Lovenox .  Received dose of sotalol  this morning.  Will put the patient on Cardizem  drip since rate is not controlled.  On metoprolol  IV push as well.   Parkinson's disease Resume home regimen when more alert and no longer n.p.o. usually takes medications with applesauce.  Patient is very somnolent and lethargic.  Failed swallow evaluation.  Seen by speech therapy.  Unable to give Sinemet    Hypotension Received albumin initially..  Likely secondary to autonomic dysfunction from Parkinson's disease.  Blood pressure slightly better.   Sinus bradycardia Avoid AV nodal blockade agents.  Continue to monitor   Acute on chronic chronic HFpEF with grade 2 diastolic dysfunction BNP greater than 400.  Chest x-ray shows some vascular congestion.  Blood pressure was low on admission.  Continue strict intake and output charting.  Check 2D echocardiogram.  Consider diuretics when blood pressure is better.  Torsemide  has been ordered at this time.  Will change to IV if her mental status does not improve.  Negative balance for 1897 mL.   Acute  urinary retention Foley catheter placed on 01/16/2024 continue to monitor urinary output.  Likely secondary to constipation.  Will continue to monitor intake and output charting.   Generalized weakness Consider PT OT evaluation when patient is better.   Hyperlipidemia Resume home Zetia  when no longer n.p.o.   Chronic anxiety and  depression Resume home regimen when no longer n.p.o.   Polyneuropathy Resume home gabapentin  when no longer n.p.o.   QTc prolongation QTc on twelve-lead EKG 537.  Will continue to monitor electrolytes.   DVT prophylaxis: On Lovenox    Disposition: Uncertain at this time  Status is: Inpatient Remains inpatient appropriate because: Pending clinical improvement, deteriorating clinical status    Code Status:     Code Status: Limited: Do not attempt resuscitation (DNR) -DNR-LIMITED -Do Not Intubate/DNI   Family Communication:  I had a prolonged discussion with the patient's daughter Selena Bush at bedside regarding the goals of care.  Nursing staff also present. Patient is somnolent and barely responsive.  Patient's daughter states that she has been declining in her overall health and she does not wish her mother to be put on a life support or undergo CPR or tube feeding.  She is okay with current level of care and do the doable.  Will change the CODE STATUS to DNR/DNI.  No plans for feeding tube at this time.  Consultants: None  Procedures: None  Anti-infectives:  Will change to vancomycin  cefepime and Flagyl .  Anti-infectives (From admission, onward)    Start     Dose/Rate Route Frequency Ordered Stop   01/16/24 2300  doxycycline  (VIBRAMYCIN ) 100 mg in sodium chloride  0.9 % 250 mL IVPB        100 mg 125 mL/hr over 120 Minutes Intravenous Every 12 hours 01/16/24 2202     01/16/24 2245  levofloxacin (LEVAQUIN) IVPB 750 mg  Status:  Discontinued        750 mg 100 mL/hr over 90 Minutes Intravenous Every 24 hours 01/16/24 2156 01/16/24 2158   01/16/24 2245  levofloxacin (LEVAQUIN) IVPB 750 mg  Status:  Discontinued        750 mg 100 mL/hr over 90 Minutes Intravenous Every 48 hours 01/16/24 2158 01/16/24 2202        Subjective: Today, patient was seen and examined at bedside.  Patient barely responsive and very lethargic and somnolent.  Was taken off BiPAP this morning after  ABG.  Patient daughter at bedside.  Objective: Vitals:   01/17/24 1316 01/17/24 1330  BP: 116/76 112/70  Pulse: (!) 142 (!) 145  Resp: (!) 26 (!) 27  Temp:    SpO2: 97% 98%    Intake/Output Summary (Last 24 hours) at 01/17/2024 1335 Last data filed at 01/17/2024 0347 Gross per 24 hour  Intake 250 ml  Output 1800 ml  Net -1550 ml   Filed Weights   01/16/24 0430 01/16/24 1700 01/17/24 0150  Weight: 60.3 kg 66 kg 65.6 kg   Body mass index is 27.33 kg/m.   Physical Exam: GENERAL: Patient somnolent, weak frail and debilitated.  Lethargic and with minimal response on stimuli.  Elderly female. HENT: No scleral pallor or icterus. Pupils equally reactive to light. Oral mucosa is moist NECK: is supple, no gross swelling noted. CHEST:   Diminished breath sounds bilaterally. CVS: S1 and S2 heard, no murmur.  Irregularly irregular rhythm with tachycardia. ABDOMEN: Soft, non-tender, bowel sounds are present. EXTREMITIES: No edema  CNS: Lethargic, unresponsive SKIN: warm and dry   Data Review: I have personally reviewed  the following laboratory data and studies,  CBC: Recent Labs  Lab 01/15/24 2224 01/16/24 0148 01/16/24 0610 01/16/24 0623 01/16/24 0834 01/17/24 0225 01/17/24 0520  WBC 8.9  --  9.0  --   --  7.7  --   HGB 13.1   < > 10.9* 10.9* 10.5* 9.3* 8.5*  HCT 39.2   < > 35.5* 32.0* 31.0* 28.9* 25.0*  MCV 92.7  --  98.6  --   --  95.4  --   PLT 161  --  192  --   --  190  --    < > = values in this interval not displayed.   Basic Metabolic Panel: Recent Labs  Lab 01/15/24 2224 01/16/24 0148 01/16/24 0220 01/16/24 1610 01/16/24 0834 01/17/24 0225 01/17/24 0520  NA 132*   < > 135 135 135 134* 134*  K 4.2   < > 3.8 3.8 3.8 3.4* 3.3*  CL 90*  --  91*  --   --  93*  --   CO2 28  --  36*  --   --  30  --   GLUCOSE 103*  --  110*  --   --  109*  --   BUN 14  --  14  --   --  15  --   CREATININE 1.13*  --  1.12*  --   --  1.20*  --   CALCIUM 9.6  --  9.8  --   --   9.2  --   MG  --   --  2.0  --   --  1.8  --   PHOS  --   --  4.4  --   --   --   --    < > = values in this interval not displayed.   Liver Function Tests: Recent Labs  Lab 01/15/24 2224  AST 21  ALT QUANTITY NOT SUFFICIENT, UNABLE TO PERFORM TEST  ALKPHOS 47  BILITOT 0.8  PROT QUANTITY NOT SUFFICIENT, UNABLE TO PERFORM TEST  ALBUMIN 3.4*   Recent Labs  Lab 01/15/24 2224  LIPASE 30   No results for input(s): "AMMONIA" in the last 168 hours. Cardiac Enzymes: No results for input(s): "CKTOTAL", "CKMB", "CKMBINDEX", "TROPONINI" in the last 168 hours. BNP (last 3 results) Recent Labs    12/22/23 1533 01/16/24 0054  BNP 135.4* 413.0*    ProBNP (last 3 results) No results for input(s): "PROBNP" in the last 8760 hours.  CBG: No results for input(s): "GLUCAP" in the last 168 hours. No results found for this or any previous visit (from the past 240 hours).   Studies: CT Head Wo Contrast Result Date: 01/16/2024 EXAM: CT HEAD WITHOUT 01/16/2024 03:08:52 AM TECHNIQUE: CT of the head was performed without the administration of intravenous contrast. Automated exposure control, iterative reconstruction, and/or weight based adjustment of the mA/kV was utilized to reduce the radiation dose to as low as reasonably achievable. COMPARISON: 07/28/2023 CLINICAL HISTORY: Mental status change, unknown cause. FINDINGS: BRAIN AND VENTRICLES: There is no acute intracranial hemorrhage, mass effect or midline shift. No abnormal extra-axial fluid collection. The gray-white differentiation is maintained without an acute infarct. There is no hydrocephalus. Global cortical atrophy. Subcortical and periventricular small vessel ischemic changes. ORBITS: The visualized portion of the orbits demonstrate no acute abnormality. SINUSES: The visualized paranasal sinuses and mastoid air cells demonstrate no acute abnormality. SOFT TISSUES AND SKULL: No acute abnormality of the visualized skull or soft tissues.  VASCULATURE: Intracranial  atherosclerosis. IMPRESSION: 1. No acute intracranial abnormality. 2. Global cortical atrophy and small vessel ischemic changes. Electronically signed by: Zadie Herter MD 01/16/2024 03:14 AM EDT RP Workstation: [id number]   CT ABDOMEN PELVIS WO CONTRAST Result Date: 01/16/2024 CLINICAL DATA:  Acute nonlocalized abdominal pain. EXAM: CT ABDOMEN AND PELVIS WITHOUT CONTRAST TECHNIQUE: Multidetector CT imaging of the abdomen and pelvis was performed following the standard protocol without IV contrast. RADIATION DOSE REDUCTION: This exam was performed according to the departmental dose-optimization program which includes automated exposure control, adjustment of the mA and/or kV according to patient size and/or use of iterative reconstruction technique. COMPARISON:  Ultrasound kidneys 01/04/2024 FINDINGS: Lower chest: Motion artifact limits examination. Bilateral perihilar infiltrates likely representing pneumonia or edema. Mild cardiac enlargement. Hepatobiliary: No focal liver abnormality is seen. Status post cholecystectomy. No biliary dilatation. Pancreas: Unremarkable. No pancreatic ductal dilatation or surrounding inflammatory changes. Spleen: Normal in size without focal abnormality. Adrenals/Urinary Tract: Adrenal glands are unremarkable. Kidneys are normal, without renal calculi, focal lesion, or hydronephrosis. Bladder is decompressed with a Foley catheter. Stomach/Bowel: Stomach, small bowel, and colon are not abnormally distended. The colon is diffusely stool-filled. Stool-filled rectum with rectal wall thickening may indicate stercoral colitis. Appendix is normal. Vascular/Lymphatic: Aortic atherosclerosis. No enlarged abdominal or pelvic lymph nodes. Reproductive: Status post hysterectomy. No adnexal masses. Other: No free air or free fluid in the abdomen. Diastasis or laxity of the anterior abdominal wall musculature without true herniation. Musculoskeletal: Degenerative  changes in the spine. No acute bony abnormalities. IMPRESSION: 1. Diffusely stool-filled colon consistent with constipation. Stool-filled rectum with rectal wall thickening suggesting stercoral colitis. 2. Aortic atherosclerosis. 3. Bilateral perihilar infiltrates suggesting pneumonia or edema. Cardiac enlargement. Electronically Signed   By: Boyce Byes M.D.   On: 01/16/2024 00:27   DG Chest 2 View Result Date: 01/15/2024 CLINICAL DATA:  SOB CHF EXAM: CHEST - 2 VIEW COMPARISON:  11/07/2023. FINDINGS: Elevated right hemidiaphragm. Pulmonary vascular congestion. No pneumothorax. Enlarged cardiac silhouette. Thoracic degenerative changes. Calcified aorta. Thoracic degenerative disc disease. IMPRESSION: Enlarged cardiac silhouette with vascular congestion. Electronically Signed   By: Sydell Eva M.D.   On: 01/15/2024 22:12      Rosena Conradi, MD  [facility] 01/17/2024  If 7PM-7AM, please contact night-coverage

## 2024-01-17 NOTE — Evaluation (Signed)
 Clinical/Bedside Swallow Evaluation Patient Details  Name: Selena Bush MRN: [id number] Date of Birth: 09/02/1945  Today's Date: 01/17/2024 Time: SLP Start Time (ACUTE ONLY): 0755 SLP Stop Time (ACUTE ONLY): 0831 SLP Time Calculation (min) (ACUTE ONLY): 36 min  Past Medical History:  Past Medical History:  Diagnosis Date   Anxiety    Atrial fibrillation (HCC)    Cat bite of right hand 03/25/2019   Chronic diastolic CHF (congestive heart failure) (HCC)    a. echo 09/2014: EF 60-65%, diastolic dysfunction, mild LVH, nl RV size & systolic function, mildly dilated LA (4.3 cm), mild MR/TR, mildly elevated PASP 36.7 mm Hg   DDD (degenerative disc disease), cervical    DDD (degenerative disc disease), lumbar    Depression    Diffuse cystic mastopathy 2014   Dyspnea    Dysrhythmia    GERD (gastroesophageal reflux disease)    Gout    Headache    rare   HLD (hyperlipidemia)    a. statin intolerant 2/2 myalgias   Hx of dysplastic nevus 12/25/2018   L medial ankle   Hypercholesterolemia    Hypertension    Malignant neoplasm of upper-outer quadrant of female breast (HCC) 10/2012   Papillary DCIS, sentinel node negative. DR/PR positive. PARTIAL RIGHT MASTECTOMY FOR BREAST CANCER--HAD RADIATION - NO CHEMO --DR. CRYSTAL Myton ONCOLOGIST   Obesity    OSA on CPAP    Osteoarthritis of both knees    a. s/p right TKA 04/2013 & left TKA 09/2014   Otitis externa    Parkinson disease [facility])    Personal history of radiation therapy 2015   RIGHT breast-mammosite per pt   Pneumonia of both lungs due to infectious organism 01/08/2021   Sleep difficulties    LUNESTA HAS HELPED   Vaginal cyst    Past Surgical History:  Past Surgical History:  Procedure Laterality Date   ABDOMINAL HYSTERECTOMY  1992   DUB; fibroids; endometriosis.  One remaining ovary.     BREAST LUMPECTOMY Right 2015   Papillary DCIS, sentinel node negative. DR/PR positive. PARTIAL RIGHT MASTECTOMY FOR BREAST CANCER--HAD  RADIATION - NO CHEMO --DR. CRYSTAL Weston ONCOLOGIST   BREAST SURGERY Right 10/2012   Wide excision,APB RT 10 mm papillary DCIS, ER/PR positive. Sentinel node negative. Partial breast radiation.   CATARACT EXTRACTION, BILATERAL  02/13/2016   [facility].   CHOLECYSTECTOMY  1994   COLONOSCOPY  2015   1 benign polyp-every 5 years/ Dr Janine Melbourne   COLONOSCOPY WITH PROPOFOL  N/A 02/10/2021   Procedure: COLONOSCOPY WITH PROPOFOL ;  Surgeon: Marshall Skeeter, MD;  Location: [facility] ENDOSCOPY;  Service: Endoscopy;  Laterality: N/A;   ERCP  1995   JOINT REPLACEMENT Right 04/2013   knee   PERICARDIOCENTESIS N/A 11/13/2017   Procedure: PERICARDIOCENTESIS;  Surgeon: Sammy Crisp, MD;  Location: [facility] INVASIVE CV LAB;  Service: Cardiovascular;  Laterality: N/A;   TOTAL KNEE ARTHROPLASTY Right 04/16/2013   Procedure: RIGHT TOTAL KNEE ARTHROPLASTY;  Surgeon: Bevin Bucks, MD;  Location: [facility] ORS;  Service: Orthopedics;  Laterality: Right;   TOTAL KNEE ARTHROPLASTY Left 09/15/2014   Procedure: LEFT TOTAL KNEE ARTHROPLASTY;  Surgeon: Bevin Bucks, MD;  Location: [facility] ORS;  Service: Orthopedics;  Laterality: Left;   TUBAL LIGATION  1979   UPPER GI ENDOSCOPY     HPI:  Per MD note, "Selena Bush is a 78 y.o. female with medical history significant for Parkinson's disease, history of CVA (2024), paroxysmal A-fib on Eliquis , hypertension, hyperlipidemia, type 2 diabetes, OSA on CPAP,  presented to the hospital with decreased urine output despite increase dose of her diuretics by her cardiologist 2 days ago.  Patient also reported constipation for more than 8 days.  In the ED urinalysis was negative for pyuria but had urinary retention and Foley catheter was placed.  CT scan of the abdomen and pelvis showed diffusely filled colon consistent with constipation.  There was some evidence of stercoral colitis.  Bilateral perihilar infiltrates were also noted.  Patient was then admitted hospital for further evaluation and  treatment.   Daughter reported to this SLP that pt has Parkinson's x5 years and has not had her meds since prior to admission.    Assessment / Plan / Recommendation  Clinical Impression  Patient asleep upon entrance to room -and daughter reports she has had AMS since Sunday due to her CO2 elevation. Pt was just taken off bipap - placed on 6 L [place] before SLP went to see her and SLP spoke to April, RT.   Pt required total assist for repositioning and oral care.  Removed copious viscous yellow tinged secretions using oral suctioning and dental brushing - with pt requiring total assist.  Pt verbalized "yes" and "thank you" during session with very weak phonatory ability.   Daughter reports pt has not had her Parkinson's medications since Sunday night (Rytary - 36.25 mg - taken 4 times a day) and expressed concern for pt's lack of medications impacting her function.  She left to go get pt's medications - with RN permission.  In light of this, provided pt with minimal intake including ice chips x1, gingerale x3 tip of straw boluses, and 2 small boluses of applesauce.  Prolonged oral holding noted - with pt producing inconsistent swallow per observation of laryngeal elevation.  Swallow very difficult to observe due to accessory nerve involvement causing pt's head to be flexed and turned to the right.  Swallow was very delayed in initiation when it was conducted and she required total cues to elicit * suspect impaired sensation due to possible secretion retention in pharynx* - thus recommend NPO x small sips of thin water and Parkinson's medication only.  Requested RN contact SLP after pt has her Parkinson's medications with improved motor skills observed.  Hopeful for improvement with Parkinson's medications.    Pt does have baseline dysphagia, inadequate liquid intake and weight loss over the last two years. She also requires fed by caregivers -all above increasing her risk of aspiration pna. Thanks for this consult.     SLP Visit Diagnosis: Dysphagia, oropharyngeal phase (R13.12)    Aspiration Risk  Severe aspiration risk;Risk for inadequate nutrition/hydration    Diet Recommendation NPO except meds    Liquid Administration via:  (TIP OF STRAW IN LIQUID PUT TO MOUTH) Medication Administration: Other (Comment) Supervision:  (with small amount of applesauce - can use liquid to aid oral transiting) Compensations: Slow rate;Small sips/bites (observe swallow - oral suction before and after intake) Postural Changes: Remain upright for at least 30 minutes after po intake    Other  Recommendations Oral Care Recommendations: Oral care before and after PO Caregiver Recommendations: Have oral suction available    Recommendations for follow up therapy are one component of a multi-disciplinary discharge planning process, led by the attending physician.  Recommendations may be updated based on patient status, additional functional criteria and insurance authorization.  Follow up Recommendations Other (comment)      Assistance Recommended at Discharge    Functional Status Assessment Patient has had a recent  decline in their functional status and demonstrates the ability to make significant improvements in function in a reasonable and predictable amount of time.  Frequency and Duration min 2x/week  2 weeks       Prognosis Prognosis for improved oropharyngeal function: Guarded Barriers to Reach Goals: Time post onset      Swallow Study   General Date of Onset: 01/15/24 HPI: Per MD note, "HISAE DECOURSEY is a 78 y.o. female with medical history significant for Parkinson's disease, history of CVA (2024), paroxysmal A-fib on Eliquis , hypertension, hyperlipidemia, type 2 diabetes, OSA on CPAP, presented to the hospital with decreased urine output despite increase dose of her diuretics by her cardiologist 2 days ago.  Patient also reported constipation for more than 8 days.  In the ED urinalysis was negative for  pyuria but had urinary retention and Foley catheter was placed.  CT scan of the abdomen and pelvis showed diffusely filled colon consistent with constipation.  There was some evidence of stercoral colitis.  Bilateral perihilar infiltrates were also noted.  Patient was then admitted hospital for further evaluation and treatment.   Daughter reported to this SLP that pt has Parkinson's x5 years and has not had her meds since prior to admission. Type of Study: Bedside Swallow Evaluation Previous Swallow Assessment: none in EPIC Diet Prior to this Study: NPO Temperature Spikes Noted: No Respiratory Status: Nasal cannula (six liters) History of Recent Intubation: No Behavior/Cognition: Lethargic/Drowsy;Doesn't follow directions Oral Cavity Assessment: Excessive secretions (copious) Oral Care Completed by SLP: Yes Oral Cavity - Dentition: Adequate natural dentition Vision: Impaired for self-feeding Self-Feeding Abilities: Total assist (even PTA) Patient Positioning: Postural control interferes with function Baseline Vocal Quality: Low vocal intensity Volitional Cough: Cognitively unable to elicit Volitional Swallow: Unable to elicit    Oral/Motor/Sensory Function Overall Oral Motor/Sensory Function: Generalized oral weakness (difficult to assess due to pt's mentation, and motor deficits) Facial ROM:  (ableto seal lips on straw and spoon) Lingual ROM: Other (Comment);Reduced right;Reduced left Lingual Symmetry: Within Functional Limits Lingual Strength: Other (Comment) Lingual Sensation: Reduced (oral suction required to clear secretions) Velum: Other (comment) (unable to view) Mandible: Other (Comment)   Ice Chips Ice chips: Impaired Presentation: Spoon Oral Phase Impairments: Reduced labial seal;Reduced lingual movement/coordination;Poor awareness of bolus Oral Phase Functional Implications: Oral holding Other Comments: swallow not observed   Thin Liquid Thin Liquid: Impaired Presentation:  Straw;Spoon (TIP OF STRAW DIPPED INTO DRINK) Oral Phase Impairments: Reduced lingual movement/coordination;Poor awareness of bolus Oral Phase Functional Implications: Oral holding;Prolonged oral transit Pharyngeal  Phase Impairments: Suspected delayed Swallow    Nectar Thick Nectar Thick Liquid: Not tested   Honey Thick Honey Thick Liquid: Not tested   Puree Puree: Impaired Presentation: Spoon Oral Phase Impairments: Reduced lingual movement/coordination;Poor awareness of bolus Oral Phase Functional Implications: Oral holding;Prolonged oral transit Pharyngeal Phase Impairments: Suspected delayed Swallow   Solid     Solid: Not tested      Chantal Comment 01/17/2024,9:30 AM  Maudie Sorrow, MS [facility] SLP Acute Rehab Services Office [phone]

## 2024-01-17 NOTE — Progress Notes (Signed)
   01/17/24 1031  Assess: MEWS Score  BP 120/84  MAP (mmHg) 91  Pulse Rate (!) 135  ECG Heart Rate (!) 129  Resp (!) 21  SpO2 97 %  Assess: MEWS Score  MEWS Temp 2  MEWS Systolic 0  MEWS Pulse 2  MEWS RR 1  MEWS LOC 1  MEWS Score 6  MEWS Score Color Red  Assess: if the MEWS score is Yellow or Red  Were vital signs accurate and taken at a resting state? Yes  Does the patient meet 2 or more of the SIRS criteria? No  Does the patient have a confirmed or suspected source of infection? No  MEWS guidelines implemented  No, previously red, continue vital signs every 4 hours  Assess: SIRS CRITERIA  SIRS Temperature  1  SIRS Respirations  1  SIRS Pulse 1  SIRS WBC 0  SIRS Score Sum  3

## 2024-01-17 NOTE — Progress Notes (Signed)
   01/17/24 1330  Assess: MEWS Score  BP 112/70  MAP (mmHg) 81  Pulse Rate (!) 145  ECG Heart Rate (!) 139  Resp (!) 27  SpO2 98 %  O2 Device HFNC  O2 Flow Rate (L/min) 6 L/min  Assess: MEWS Score  MEWS Temp 2  MEWS Systolic 0  MEWS Pulse 3  MEWS RR 2  MEWS LOC 1  MEWS Score 8  MEWS Score Color Red  Assess: if the MEWS score is Yellow or Red  Were vital signs accurate and taken at a resting state? Yes  Does the patient meet 2 or more of the SIRS criteria? No  Does the patient have a confirmed or suspected source of infection? No  MEWS guidelines implemented  No, previously red, continue vital signs every 4 hours  Assess: SIRS CRITERIA  SIRS Temperature  1  SIRS Respirations  1  SIRS Pulse 1  SIRS WBC 0  SIRS Score Sum  3

## 2024-01-17 NOTE — Progress Notes (Signed)
   01/17/24 1000  Assess: MEWS Score  Temp (!) 102.7 F (39.3 C)  Assess: MEWS Score  MEWS Temp 2  MEWS Systolic 0  MEWS Pulse 3  MEWS RR 0  MEWS LOC 1  MEWS Score 6  MEWS Score Color Red  Assess: if the MEWS score is Yellow or Red  Were vital signs accurate and taken at a resting state? Yes  Does the patient meet 2 or more of the SIRS criteria? No  Does the patient have a confirmed or suspected source of infection? No  MEWS guidelines implemented  No, previously red, continue vital signs every 4 hours  Assess: SIRS CRITERIA  SIRS Temperature  1  SIRS Respirations  0  SIRS Pulse 1  SIRS WBC 0  SIRS Score Sum  2

## 2024-01-17 NOTE — Progress Notes (Signed)
 PT Cancellation Note  Patient Details Name: Selena Bush MRN: [id number] DOB: 12/14/1945   Cancelled Treatment:    Reason Eval/Treat Not Completed: Medical issues which prohibited therapy; RN in the room and assessing pt with elevated HR and difficulty with oral meds this am.  Will follow up as pt able to participate and schedule permits.    Marley Simmers 01/17/2024, 10:23 AM Abigail Hoff, PT Acute Rehabilitation Services Office:[phone] 01/17/2024

## 2024-01-17 NOTE — Progress Notes (Signed)
   01/17/24 1316  Assess: MEWS Score  BP 116/76  MAP (mmHg) 87  Pulse Rate (!) 142  ECG Heart Rate (!) 157  Resp (!) 26  SpO2 97 %  O2 Device HFNC  O2 Flow Rate (L/min) 6 L/min  Assess: MEWS Score  MEWS Temp 2  MEWS Systolic 0  MEWS Pulse 3  MEWS RR 2  MEWS LOC 1  MEWS Score 8  MEWS Score Color Red  Assess: if the MEWS score is Yellow or Red  Were vital signs accurate and taken at a resting state? Yes  Does the patient meet 2 or more of the SIRS criteria? No  Does the patient have a confirmed or suspected source of infection? No  MEWS guidelines implemented  No, previously red, continue vital signs every 4 hours  Assess: SIRS CRITERIA  SIRS Temperature  1  SIRS Respirations  1  SIRS Pulse 1  SIRS WBC 0  SIRS Score Sum  3

## 2024-01-17 NOTE — Progress Notes (Signed)
 Swallow evaluation completed with daughter present.  Preliminary results as follows:  Patient asleep upon entrance to room -and daughter reports she has had AMS since Sunday due to her CO2 elevation. Pt was just taken off bipap - placed on 6 L [place] before SLP went to see her and SLP spoke to April, RT.   Pt required total assist for repositioning and oral care.  Removed copious viscous yellow tinged secretions using oral suctioning and dental brushing - with pt requiring total assist.  Pt verbalized "yes" and "thank you" during session with very weak phonatory ability.   Daughter reports pt has not had her Parkinson's medications since Sunday night (Rytaryer- 36.25 mg - taken 4 times a day) and expressed concern for pt's lack of medications impacting her function.  She left to go get pt's medications - with RN permission.  In light of this, provided pt with minimal intake including ice chips x1, gingerale x3 tip of straw boluses, and 2 small boluses of applesauce.  Prolonged oral holding noted - with pt producing inconsistent swallow per observation of laryngeal elevation.  Swallow very difficult to observe due to accessory nerve involvement causing pt's head to be flexed and turned to the right.  Swallow was very delayed in initiation when it was conducted and she required total cues to elicit * suspect impaired sensation due to possible secretion * - thus recommend NPO x small sips of thin water and Parkinson's medication only.  Requested RN contact SLP after pt has her Parkinson's medications with improved motor skills.     Maudie Sorrow, MS [place] SLP Acute [employer] [phone]

## 2024-01-17 NOTE — Progress Notes (Signed)
 Patient wearing oxygen  set at 6lpm with sp02=98% at this time. Patient able to open eyes when asked. Breath sounds clear to slightly decreased. Patient does not appear to be in any respiratory distress at this time.

## 2024-01-17 NOTE — Progress Notes (Signed)
   01/17/24 1300  Assess: MEWS Score  Temp (!) 102.6 F (39.2 C)  BP 118/74  MAP (mmHg) 89  Pulse Rate (!) 131  ECG Heart Rate (!) 120  Resp (!) 26  SpO2 98 %  O2 Device HFNC  O2 Flow Rate (L/min) 6 L/min  Assess: MEWS Score  MEWS Temp 2  MEWS Systolic 0  MEWS Pulse 2  MEWS RR 2  MEWS LOC 1  MEWS Score 7  MEWS Score Color Red  Assess: if the MEWS score is Yellow or Red  Were vital signs accurate and taken at a resting state? Yes  Does the patient meet 2 or more of the SIRS criteria? No  Does the patient have a confirmed or suspected source of infection? No  MEWS guidelines implemented  No, previously red, continue vital signs every 4 hours  Assess: SIRS CRITERIA  SIRS Temperature  1  SIRS Respirations  1  SIRS Pulse 1  SIRS WBC 0  SIRS Score Sum  3

## 2024-01-17 NOTE — Progress Notes (Signed)
 PHARMACY - PHYSICIAN COMMUNICATION CRITICAL VALUE ALERT - BLOOD CULTURE IDENTIFICATION (BCID)  Selena Bush is an 77 y.o. female who presented to [facility] on 01/15/2024 with a chief complaint of decreased urine ouptut and constipation.   Assessment:  WBC WNL, PCT <0.1. Tmax 102.7. Bcx 2/4 (anaerobic bottles from different sets) growing GPC - staph epi with mecA resistance.   Name of physician (or Provider) Contacted: Dr Achilles Holes  Current antibiotics: cefepime, metronidazole  and vancomycin   Changes to prescribed antibiotics recommended:  Staph epi covered currently with vancomycin . Given continued fever and concern for aspiration pneumonia/colitis, will remain on current regimen and consider de-escalation as clinical appropriate.   Results for orders placed or performed during the hospital encounter of 01/15/24  Blood Culture ID Panel (Reflexed) (Collected: 01/17/2024 12:19 AM)  Result Value Ref Range   Enterococcus faecalis NOT DETECTED NOT DETECTED   Enterococcus Faecium NOT DETECTED NOT DETECTED   Listeria monocytogenes NOT DETECTED NOT DETECTED   Staphylococcus species DETECTED (A) NOT DETECTED   Staphylococcus aureus (BCID) NOT DETECTED NOT DETECTED   Staphylococcus epidermidis DETECTED (A) NOT DETECTED   Staphylococcus lugdunensis NOT DETECTED NOT DETECTED   Streptococcus species NOT DETECTED NOT DETECTED   Streptococcus agalactiae NOT DETECTED NOT DETECTED   Streptococcus pneumoniae NOT DETECTED NOT DETECTED   Streptococcus pyogenes NOT DETECTED NOT DETECTED   A.calcoaceticus-baumannii NOT DETECTED NOT DETECTED   Bacteroides fragilis NOT DETECTED NOT DETECTED   Enterobacterales NOT DETECTED NOT DETECTED   Enterobacter cloacae complex NOT DETECTED NOT DETECTED   Escherichia coli NOT DETECTED NOT DETECTED   Klebsiella aerogenes NOT DETECTED NOT DETECTED   Klebsiella oxytoca NOT DETECTED NOT DETECTED   Klebsiella pneumoniae NOT DETECTED NOT DETECTED   Proteus species NOT  DETECTED NOT DETECTED   Salmonella species NOT DETECTED NOT DETECTED   Serratia marcescens NOT DETECTED NOT DETECTED   Haemophilus influenzae NOT DETECTED NOT DETECTED   Neisseria meningitidis NOT DETECTED NOT DETECTED   Pseudomonas aeruginosa NOT DETECTED NOT DETECTED   Stenotrophomonas maltophilia NOT DETECTED NOT DETECTED   Candida albicans NOT DETECTED NOT DETECTED   Candida auris NOT DETECTED NOT DETECTED   Candida glabrata NOT DETECTED NOT DETECTED   Candida krusei NOT DETECTED NOT DETECTED   Candida parapsilosis NOT DETECTED NOT DETECTED   Candida tropicalis NOT DETECTED NOT DETECTED   Cryptococcus neoformans/gattii NOT DETECTED NOT DETECTED   Methicillin resistance mecA/C DETECTED (A) NOT DETECTED    Thank you for allowing pharmacy to participate in this patient's care,  Nieves Bars, PharmD, BCCCP Clinical Pharmacist  Phone: [phone] 01/17/2024 7:34 PM  Please check AMION for all [facility] Pharmacy phone numbers After 10:00 PM, call Main Pharmacy [phone]

## 2024-01-17 NOTE — Progress Notes (Signed)
  Echocardiogram 2D Echocardiogram has been performed.  Fain Home RDCS 01/17/2024, 3:06 PM

## 2024-01-18 ENCOUNTER — Ambulatory Visit: Payer: Medicare (Managed Care) | Admitting: Student

## 2024-01-18 ENCOUNTER — Inpatient Hospital Stay (HOSPITAL_COMMUNITY): Payer: Medicare (Managed Care)

## 2024-01-18 DIAGNOSIS — K5289 Other specified noninfective gastroenteritis and colitis: Secondary | ICD-10-CM | POA: Diagnosis not present

## 2024-01-18 DIAGNOSIS — I350 Nonrheumatic aortic (valve) stenosis: Secondary | ICD-10-CM

## 2024-01-18 LAB — BASIC METABOLIC PANEL WITH GFR
Anion gap: 10 (ref 5–15)
BUN: 12 mg/dL (ref 8–23)
CO2: 26 mmol/L (ref 22–32)
Calcium: 8.9 mg/dL (ref 8.9–10.3)
Chloride: 100 mmol/L (ref 98–111)
Creatinine, Ser: 0.93 mg/dL (ref 0.44–1.00)
GFR, Estimated: >60 mL/min (ref >60)
Glucose, Bld: 128 mg/dL — ABNORMAL HIGH (ref 70–99)
Potassium: 3.5 mmol/L (ref 3.5–5.1)
Sodium: 136 mmol/L (ref 135–145)

## 2024-01-18 LAB — ECHOCARDIOGRAM LIMITED
AR max vel: 2.01 cm2
AV Area VTI: 2.39 cm2
AV Area mean vel: 2.22 cm2
AV Mean grad: 11 mmHg
AV Peak grad: 21.7 mmHg
Ao pk vel: 2.33 m/s
Height: 61 in
Weight: 2380.97 [oz_av]

## 2024-01-18 LAB — CBC
HCT: 28.4 % — ABNORMAL LOW (ref 36.0–46.0)
Hemoglobin: 9.3 g/dL — ABNORMAL LOW (ref 12.0–15.0)
MCH: 31 pg (ref 26.0–34.0)
MCHC: 32.7 g/dL (ref 30.0–36.0)
MCV: 94.7 fL (ref 80.0–100.0)
Platelets: 171 10*3/uL (ref 150–400)
RBC: 3 MIL/uL — ABNORMAL LOW (ref 3.87–5.11)
RDW: 13.3 % (ref 11.5–15.5)
WBC: 11.4 10*3/uL — ABNORMAL HIGH (ref 4.0–10.5)
nRBC: 0 % (ref 0.0–0.2)

## 2024-01-18 LAB — MAGNESIUM: Magnesium: 1.9 mg/dL (ref 1.7–2.4)

## 2024-01-18 MED ORDER — GERHARDT'S BUTT CREAM
TOPICAL_CREAM | Freq: Two times a day (BID) | CUTANEOUS | Status: DC
  Filled 2024-01-18 (×2): qty 60

## 2024-01-18 NOTE — Progress Notes (Signed)
 SLP Cancellation Note  Patient Details Name: LAJOYCE TAMURA MRN: [id number] DOB: June 02, 1946   Cancelled treatment:       Reason Eval/Treat Not Completed: Fatigue/lethargy limiting ability to participate; nursing deferred; continued somnolence.  ST will continue efforts as pt able.   Pat Javon Snee,M.S.,CCC-SLP 01/18/2024, 11:20 AM

## 2024-01-18 NOTE — TOC Progression Note (Signed)
 Transition of Care [facility]) - Progression Note    Patient Details  Name: Selena Bush MRN: [id number] Date of Birth: July 28, 1946  Transition of Care [facility]) CM/SW Contact  Arron Big, [place] Phone Number: 01/18/2024, 10:50 AM  Clinical Narrative:   Per daily meeting with treatment team, palliative to be consulted.   TOC will continue to follow.         Expected Discharge Plan and Services                                               Social Determinants of Health (SDOH) Interventions SDOH Screenings   Food Insecurity: No Food Insecurity (01/06/2022)  Transportation Needs: No Transportation Needs (01/06/2022)  Depression (PHQ2-9): Low Risk  (04/11/2022)  Financial Resource Strain: Low Risk  (01/06/2022)  Physical Activity: Insufficiently Active (01/06/2022)  Social Connections: Moderately Integrated (10/02/2017)  Stress: No Stress Concern Present (01/06/2022)  Tobacco Use: Low Risk  (01/15/2024)    Readmission Risk Interventions     No data to display

## 2024-01-18 NOTE — Evaluation (Signed)
 Physical Therapy Evaluation and Discharge Patient Details Name: Selena Bush MRN: [id number] DOB: [birth year] Today's Date: 01/18/2024  History of Present Illness  Selena Bush is a 78 y.o. female admitted 01/15/24 for stercoral colitis. Pt presented with decreased urine output and constipation for more than 8 days. UA was negative for pyuria. Had acute urinary retention and Foley catheter was placed. CT abdomen and pelvis revealed diffusely stool-filled colon consistent with constipation, stool-filled rectum with rectal wall thickening suggesting stercoral colitis, and bilateral perihilar infiltrates suggesting pneumonia or edema, cardiac enlargement. PMH significant for parkinson's disease, history of CVA (2024), paroxysmal A-fib on Eliquis , HTN, HLD, T2DM, and OSA on CPAP.   Clinical Impression  Pt admitted with above diagnosis. PTA, pt was dependent with functional mobility using a lift to transfer and was dependent with ADLs/IADLs. She receives assistance from the [facility] and has multiple PCA to assist her. Per family the only time the pt doesn't have an aide is in the evening; however, her friend/roommate is home with her, sleeping nearby, and could assist if needed. During today's session the pt required totalA x2 for bed mobility. She appears to be at her baseline function, no further acute PT needs.     If plan is discharge home, recommend the following: Two people to help with walking and/or transfers;Two people to help with bathing/dressing/bathroom;Assistance with cooking/housework;Assistance with feeding;Direct supervision/assist for medications management;Direct supervision/assist for financial management;Assist for transportation;Help with stairs or ramp for entrance;Supervision due to cognitive status   Can travel by private vehicle   No    Equipment Recommendations Hospital bed;Hoyer lift  Recommendations for Other Services       Functional Status Assessment Patient has  not had a recent decline in their functional status     Precautions / Restrictions Precautions Precautions: Fall Recall of Precautions/Restrictions: Impaired Restrictions Weight Bearing Restrictions Per Provider Order: No      Mobility  Bed Mobility Overal bed mobility: Needs Assistance Bed Mobility: Supine to Sit, Sit to Supine, Rolling Rolling: Total assist, +2 for physical assistance, +2 for safety/equipment   Supine to sit: Total assist, +2 for physical assistance, +2 for safety/equipment Sit to supine: Total assist, +2 for physical assistance, +2 for safety/equipment   General bed mobility comments: Pt sat up on L side of bed through PT/OT pivoting bed pad to bring pt's BLE off EOB while elevating the trunk upright. She maintained seated balance with mod-maxA provided by OT posteriorly. Able to briefly release assist and pt remained upright, but slowly began to lean posteriorly. Returning to bed PT controlled BLE while OT managed trunk. Once supine, pt had a BM, so completed rolling R/L multiple times to address hygiene, change bed pads, and for RN to give supository. PT/OT facilitated pt's roll through trunk/hips and maintained her in sidelying and lifting one LE to allow for thorough cleaning. Repositioned using bed features and +2 assist.    Transfers Overall transfer level: Needs assistance                 General transfer comment: Deferred d/t fatigue and pt's somnolence. Pt would require a lift (maximove) to transfer.    Ambulation/Gait               General Gait Details: Pt not ambulatory at baseline. Per family uses a manual w/c and relies on others to propel her.  Stairs            Wheelchair Mobility     Tilt Bed  Modified Rankin (Stroke Patients Only)       Balance Overall balance assessment: Needs assistance Sitting-balance support: No upper extremity supported, Feet supported Sitting balance-Leahy Scale: Poor Sitting balance -  Comments: Pt required mod-maxA to remain seated upright. She was able to tolerate very short durations of no assistance to stay upright, but began to lean posteriorly as she fatigued.                                     Pertinent Vitals/Pain Pain Assessment Pain Assessment: PAINAD Breathing: occasional labored breathing, short period of hyperventilation Negative Vocalization: occasional moan/groan, low speech, negative/disapproving quality Facial Expression: facial grimacing Body Language: relaxed Consolability: unable to console, distract or reassure PAINAD Score: 6 Pain Intervention(s): Monitored during session, Repositioned, RN gave pain meds during session    Home Living Family/patient expects to be discharged to:: Private residence Living Arrangements: Non-relatives/Friends Available Help at Discharge: Family;Personal care attendant;Available PRN/intermittently Type of Home: House [facility]) Home Access: Ramped entrance       Home Layout: Able to live on main level with bedroom/bathroom Home Equipment: Lift chair;Wheelchair - Careers adviser (comment) Data processing manager which was described by daughter similar to a [employer].) Additional Comments: Pt recieves assistance from the [facility]. Additionally, her family has hired multiple PCA to provide coverage throughout the day. The only time she doesn't have an aide is at night; however, her friend/roommate is with her in the evenings and sleeps beside her on the couch while the pt sleeps in a chair.    Prior Function Prior Level of Function : Needs assist       Physical Assist : Mobility (physical);ADLs (physical) Mobility (physical): Bed mobility;Transfers;Gait ADLs (physical): Feeding;Grooming;Bathing;Dressing;Toileting;IADLs Mobility Comments: Dependent. Relies on family, friend, PCA to transfer her to w/c using lift and to propel her in w/c. ADLs Comments: Dependent. Relies on family, friend, PCA to complete all  ADLs and IADLs.     Extremity/Trunk Assessment   Upper Extremity Assessment Upper Extremity Assessment: Defer to OT evaluation    Lower Extremity Assessment Lower Extremity Assessment: Difficult to assess due to impaired cognition;RLE deficits/detail;LLE deficits/detail RLE Deficits / Details: Pt with globally limited ROM. Noted rigidity in multiple joints. No active LE muscle activation observed. RLE: Unable to fully assess due to pain LLE Deficits / Details: Pt with globally limited ROM. Noted rigidity in multiple joints. No active LE muscle activation observed. LLE: Unable to fully assess due to pain    Cervical / Trunk Assessment Cervical / Trunk Assessment: Kyphotic;Other exceptions (R-sided curvature) Cervical / Trunk Exceptions: Pt with R-sided preference. Cervical neck maintained in flexion, R lateral flex, and L rotation.  Communication   Communication Communication: Impaired Factors Affecting Communication: Difficulty expressing self;Reduced clarity of speech;Other (comment) (Family reports her speech has been limited secondary to her PD.)    Cognition Arousal: Lethargic Behavior During Therapy: Flat affect   PT - Cognitive impairments: Difficult to assess Difficult to assess due to: Level of arousal                     PT - Cognition Comments: Pt kept eyes closed throughout the majority of the session. She briefly open her eyes once seated EOB and during rolling while cleaning her up from a BM. Pt with minimal verbalizations, primarily moaning and occasionally "Oh God, please stop." Following commands: Impaired Following commands impaired:  (No Command Following Noted)  Cueing Cueing Techniques: Verbal cues, Tactile cues     General Comments General comments (skin integrity, edema, etc.): VSS on 6L HFNC.    Exercises     Assessment/Plan    PT Assessment Patient does not need any further PT services  PT Problem List         PT Treatment  Interventions      PT Goals (Current goals can be found in the Care Plan section)       Frequency       Co-evaluation PT/OT/SLP Co-Evaluation/Treatment: Yes Reason for Co-Treatment: For patient/therapist safety;To address functional/ADL transfers;Necessary to address cognition/behavior during functional activity PT goals addressed during session: Mobility/safety with mobility;Balance         AM-PAC PT "6 Clicks" Mobility  Outcome Measure Help needed turning from your back to your side while in a flat bed without using bedrails?: Total Help needed moving from lying on your back to sitting on the side of a flat bed without using bedrails?: Total Help needed moving to and from a bed to a chair (including a wheelchair)?: Total Help needed standing up from a chair using your arms (e.g., wheelchair or bedside chair)?: Total Help needed to walk in hospital room?: Total Help needed climbing 3-5 steps with a railing? : Total 6 Click Score: 6    End of Session Equipment Utilized During Treatment: Oxygen  Activity Tolerance: Patient limited by lethargy;Patient limited by fatigue Patient left: in bed;with call bell/phone within reach;with bed alarm set Nurse Communication: Mobility status;Other (comment) (Pt would benefit from [facility] and an air mattress) PT Visit Diagnosis: Other abnormalities of gait and mobility (R26.89)    Time: 0981-1914 PT Time Calculation (min) (ACUTE ONLY): 34 min   Charges:   PT Evaluation $PT Eval Moderate Complexity: 1 Mod   PT General Charges $$ ACUTE PT VISIT: 1 Visit         Glenford Lanes, PT, DPT Acute Rehabilitation Services Office: [phone] Secure Chat Preferred  Riva Chester 01/18/2024, 10:47 AM

## 2024-01-18 NOTE — Progress Notes (Signed)
 Heart Failure Navigator Progress Note  Assessed for Heart & Vascular TOC clinic readiness.  Patient does not meet criteria due to EF 70-75%, has a scheduled [facility] appointment on 01/30/2024. No HF TOC.  .   Navigator will sign off at this time.   Randie Bustle, BSN, Scientist, clinical (histocompatibility and immunogenetics) Only

## 2024-01-18 NOTE — Plan of Care (Signed)
  Problem: Clinical Measurements: Goal: Ability to maintain clinical measurements within normal limits will improve Outcome: Progressing Goal: Will remain free from infection Outcome: Not Progressing Goal: Diagnostic test results will improve Outcome: Progressing Goal: Respiratory complications will improve Outcome: Progressing Goal: Cardiovascular complication will be avoided Outcome: Progressing   Problem: Activity: Goal: Risk for activity intolerance will decrease Outcome: Not Progressing   Problem: Nutrition: Goal: Adequate nutrition will be maintained Outcome: Not Progressing

## 2024-01-18 NOTE — Progress Notes (Signed)
 PROGRESS NOTE  Selena Bush EXB:[id number] DOB: 11-Jun-1946 DOA: 01/15/2024 PCP: [facility]   LOS: 2 days   Brief narrative:   Selena Bush is a 78 y.o. female with medical history significant for Parkinson's disease, history of CVA (2024), paroxysmal A-fib on Eliquis , hypertension, hyperlipidemia, type 2 diabetes, OSA on CPAP, presented to the hospital with decreased urine output despite increase dose of her diuretics by her cardiologist 2 days ago.  Patient also reported constipation for more than 8 days.  In the ED urinalysis was negative for pyuria but had urinary retention and Foley catheter was placed.  CT scan of the abdomen and pelvis showed diffusely filled colon consistent with constipation.  There was some evidence of stercoral colitis.  Bilateral perihilar infiltrates were also noted.   In the ED patient was somnolent with abnormal VBG with pH of 7.2 and PCO2 of 88.  Labs were notable for sodium of 132 with creatinine of 1.1.  Patient was put on BiPAP for CO2 narcosis.  Patient was then admitted hospital for further evaluation and treatment.   Assessment/Plan: Principal Problem:   Stercoral colitis Active Problems:   History of ductal carcinoma in situ (DCIS) of breast   Essential hypertension, benign   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 (BMI) 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 (HCC)   Chronic diastolic CHF (congestive heart failure) (HCC)   HLD (hyperlipidemia)  Stercoral colitis Severe constipation. Constipation for 8 days prior to presentation..  Received enema x 2 yesterday with some improvement in bowel movements.  CT scan showed severe constipation on presentation..  Will continue with laxatives as needed keep the bowels moving.  Dulcolax rectal suppository daily.  Has had multiple bowel movements yesterday.   Acute on chronic hypoxic and hypercarbic respiratory failure Patient uses CPAP nightly for sleep apnea.  Initially  required BiPAP for CO2 narcosis.  Unclear whether patient uses CPAP at home since he lives with a roommate.  Patient continues to be extremely lethargic and not a candidate for BiPAP.  Had a prolonged discussion with the patient's family yesterday and at this time patient is a DO NOT RESUSCITATE DO NOT INTUBATE and no further escalation of care.   Fever.  Possibly secondary to pneumonia.  Question aspiration.  Temperature max of up to 102.6 F.  Allergic to penicillin.  Currently on vancomycin  cefepime and metronidazole .   Had some perihilar infiltrates on the CT scan of the abdomen and pelvis.    Hypokalemia.  Replenished.  Latest potassium of 3.5.  Will continue to replenish.   Acute metabolic encephalopathy secondary to CO2 narcosis Patient very somnolent and lethargic.  VBG pH 7.26 with pCO2 88.9.  ABG also showed hypercarbia initially.  Received BiPAP overnight and ABG improved so was taken off BiPAP.  Patient continues to be very somnolent. CT head without any acute findings.  At this time we will continue with the oxygen  supplementation.  No plans for aggressive treatment including intubation and patient is not a good candidate for BiPAP at this time due to risk of aspiration and somnolence   Paroxysmal A-fib on Eliquis  with RVR On Lovenox .  Sotalol  was ordered but unable to take p.o.  On IV metoprolol .  Was briefly on Cardizem  drip which has been discontinued.  Heart rate has been controlled.   Parkinson's disease Resume home regimen when more alert and no longer n.p.o. usually takes medications with applesauce.  Patient is very somnolent and lethargic.  Failed  swallow evaluation.  Seen by speech therapy.  Unable to give Sinemet  orally.   Hypotension Received albumin initially..  Likely secondary to autonomic dysfunction from Parkinson's disease.  Blood pressure better at this admission   Sinus bradycardia Avoid AV nodal blockade agents.  Continue to monitor   Acute on chronic chronic  HFpEF with grade 2 diastolic dysfunction BNP greater than 400.  Chest x-ray shows some vascular congestion.  Blood pressure was low on admission.  Continue strict intake and output charting.  Check 2D echocardiogram.  Currently on IV fluids due to n.p.o. status for maintenance.   Acute urinary retention Foley catheter placed on 01/16/2024 continue to monitor urinary output.  Likely secondary to constipation.  Will continue to monitor intake and output charting.   Generalized weakness Consider PT OT when better.   Hyperlipidemia Resume home Zetia  when no longer n.p.o.   Chronic anxiety and depression Resume home regimen when no longer n.p.o.   Polyneuropathy Resume home gabapentin  when no longer n.p.o.   QTc prolongation QTc on twelve-lead EKG 537.  Will continue to monitor electrolytes.   DVT prophylaxis: On Lovenox   Deconditioning, debility, lethargic, deteriorating overall clinical status.  Had a prolonged discussion with the patient's daughter yesterday regarding goals of care.  There is no plan for escalation of care CPR or intubation.  Patient is aware understands the poor prognosis and potential  deterioration.  Will get palliative care consultation for family support, ongoing goals of care discussed.  Disposition: Uncertain at this time  Status is: Inpatient Remains inpatient appropriate because: Pending clinical improvement, deteriorating clinical status    Code Status:     Code Status: Limited: Do not attempt resuscitation (DNR) -DNR-LIMITED -Do Not Intubate/DNI   Family Communication:  I had a prolonged discussion with the patient's daughter yesterday.  Consultants: Consult palliative care  Procedures: None  Anti-infectives:   vancomycin  cefepime and Flagyl .  Anti-infectives (From admission, onward)    Start     Dose/Rate Route Frequency Ordered Stop   01/19/24 0100  vancomycin  (VANCOREADY) IVPB 750 mg/150 mL        750 mg 150 mL/hr over 60 Minutes  Intravenous Every 36 hours 01/17/24 1343     01/17/24 1430  metroNIDAZOLE  (FLAGYL ) IVPB 500 mg        500 mg 100 mL/hr over 60 Minutes Intravenous Every 12 hours 01/17/24 1336     01/17/24 1430  vancomycin  (VANCOCIN ) IVPB 1000 mg/200 mL premix        1,000 mg 200 mL/hr over 60 Minutes Intravenous  Once 01/17/24 1336 01/18/24 0723   01/17/24 1430  ceFEPIme (MAXIPIME) 2 g in sodium chloride  0.9 % 100 mL IVPB        2 g 200 mL/hr over 30 Minutes Intravenous Every 12 hours 01/17/24 1339     01/16/24 2300  doxycycline  (VIBRAMYCIN ) 100 mg in sodium chloride  0.9 % 250 mL IVPB  Status:  Discontinued        100 mg 125 mL/hr over 120 Minutes Intravenous Every 12 hours 01/16/24 2202 01/17/24 1336   01/16/24 2245  levofloxacin (LEVAQUIN) IVPB 750 mg  Status:  Discontinued        750 mg 100 mL/hr over 90 Minutes Intravenous Every 24 hours 01/16/24 2156 01/16/24 2158   01/16/24 2245  levofloxacin (LEVAQUIN) IVPB 750 mg  Status:  Discontinued        750 mg 100 mL/hr over 90 Minutes Intravenous Every 48 hours 01/16/24 2158 01/16/24 2202  Subjective: Today, patient was seen and examined at bedside.  Patient without significant improvement.  Barely responsive on deep painful stimulus.  Very lethargic.  Some moaning and grunting visible.  Has had few bowel movements yesterday.  Objective: Vitals:   01/18/24 0924 01/18/24 0936  BP: (!) 167/94   Pulse: 66 67  Resp: 15 17  Temp:    SpO2: 98% 99%    Intake/Output Summary (Last 24 hours) at 01/18/2024 1013 Last data filed at 01/18/2024 0900 Gross per 24 hour  Intake 1652.97 ml  Output 1160 ml  Net 492.97 ml   Filed Weights   01/16/24 1700 01/17/24 0150 01/18/24 0441  Weight: 66 kg 65.6 kg 67.5 kg   Body mass index is 28.12 kg/m.   Physical Exam:  GENERAL: Patient is lethargic, minimal response to deep stimuli, moaning, elderly female appears weak frail and deconditioned.  On nasal cannula 4 L/min. HENT: No scleral pallor or icterus.  Pupils equally reactive to light. Oral mucosa is moist NECK: is supple, no gross swelling noted. CHEST:   Diminished breath sounds bilaterally.  Coarse breath sounds noted. CVS: S1 and S2 heard, no murmur.  Irregularly irregular rhythm  ABDOMEN: Soft, non-tender, bowel sounds are present. EXTREMITIES: No edema  CNS: Lethargic, unresponsive SKIN: warm and dry   Data Review: I have personally reviewed the following laboratory data and studies,  CBC: Recent Labs  Lab 01/15/24 2224 01/16/24 0148 01/16/24 0610 01/16/24 1610 01/16/24 0834 01/17/24 0225 01/17/24 0520 01/18/24 0226  WBC 8.9  --  9.0  --   --  7.7  --  11.4*  HGB 13.1   < > 10.9* 10.9* 10.5* 9.3* 8.5* 9.3*  HCT 39.2   < > 35.5* 32.0* 31.0* 28.9* 25.0* 28.4*  MCV 92.7  --  98.6  --   --  95.4  --  94.7  PLT 161  --  192  --   --  190  --  171   < > = values in this interval not displayed.   Basic Metabolic Panel: Recent Labs  Lab 01/15/24 2224 01/16/24 0148 01/16/24 0220 01/16/24 9604 01/16/24 5409 01/17/24 0225 01/17/24 0520 01/18/24 0226  NA 132*   < > 135 135 135 134* 134* 136  K 4.2   < > 3.8 3.8 3.8 3.4* 3.3* 3.5  CL 90*  --  91*  --   --  93*  --  100  CO2 28  --  36*  --   --  30  --  26  GLUCOSE 103*  --  110*  --   --  109*  --  128*  BUN 14  --  14  --   --  15  --  12  CREATININE 1.13*  --  1.12*  --   --  1.20*  --  0.93  CALCIUM 9.6  --  9.8  --   --  9.2  --  8.9  MG  --   --  2.0  --   --  1.8  --  1.9  PHOS  --   --  4.4  --   --   --   --   --    < > = values in this interval not displayed.   Liver Function Tests: Recent Labs  Lab 01/15/24 2224  AST 21  ALT QUANTITY NOT SUFFICIENT, UNABLE TO PERFORM TEST  ALKPHOS 47  BILITOT 0.8  PROT QUANTITY NOT SUFFICIENT, UNABLE TO PERFORM TEST  ALBUMIN 3.4*   Recent Labs  Lab 01/15/24 2224  LIPASE 30   No results for input(s): "AMMONIA" in the last 168 hours. Cardiac Enzymes: No results for input(s): "CKTOTAL", "CKMB", "CKMBINDEX",  "TROPONINI" in the last 168 hours. BNP (last 3 results) Recent Labs    12/22/23 1533 01/16/24 0054  BNP 135.4* 413.0*    ProBNP (last 3 results) No results for input(s): "PROBNP" in the last 8760 hours.  CBG: No results for input(s): "GLUCAP" in the last 168 hours. Recent Results (from the past 240 hours)  Culture, blood (Routine X 2) w Reflex to ID Panel     Status: None (Preliminary result)   Collection Time: 01/17/24 12:17 AM   Specimen: BLOOD LEFT HAND  Result Value Ref Range Status   Specimen Description BLOOD LEFT HAND  Final   Special Requests   Final    BOTTLES DRAWN AEROBIC AND ANAEROBIC Blood Culture results may not be optimal due to an inadequate volume of blood received in culture bottles   Culture  Setup Time   Final    GRAM POSITIVE COCCI ANAEROBIC BOTTLE ONLY CRITICAL RESULT CALLED TO, READ BACK BY AND VERIFIED WITH: PHARMD K. Catharine Clock [id number] @ 1910 FH Performed at [facility], [address] [place], [place] [address]    Culture GRAM POSITIVE COCCI  Final   Report Status PENDING  Incomplete  Culture, blood (Routine X 2) w Reflex to ID Panel     Status: None (Preliminary result)   Collection Time: 01/17/24 12:19 AM   Specimen: BLOOD LEFT HAND  Result Value Ref Range Status   Specimen Description BLOOD LEFT HAND  Final   Special Requests   Final    BOTTLES DRAWN AEROBIC AND ANAEROBIC Blood Culture adequate volume   Culture  Setup Time   Final    GRAM POSITIVE COCCI ANAEROBIC BOTTLE ONLY CRITICAL RESULT CALLED TO, READ BACK BY AND VERIFIED WITH: PHARMD K. Catharine Clock [id number] @ 1910 FH Performed at [facility], [address], [place], [place] [address]    Culture GRAM POSITIVE COCCI  Final   Report Status PENDING  Incomplete  Blood Culture ID Panel (Reflexed)     Status: Abnormal   Collection Time: 01/17/24 12:19 AM  Result Value Ref Range Status   Enterococcus faecalis NOT DETECTED NOT DETECTED Final   Enterococcus Faecium NOT DETECTED NOT DETECTED  Final   Listeria monocytogenes NOT DETECTED NOT DETECTED Final   Staphylococcus species DETECTED (A) NOT DETECTED Final    Comment: CRITICAL RESULT CALLED TO, READ BACK BY AND VERIFIED WITH: PHARMD K. HURTH [id number] @ 1910 FH    Staphylococcus aureus (BCID) NOT DETECTED NOT DETECTED Final   Staphylococcus epidermidis DETECTED (A) NOT DETECTED Final    Comment: Methicillin (oxacillin) resistant coagulase negative staphylococcus. Possible blood culture contaminant (unless isolated from more than one blood culture draw or clinical case suggests pathogenicity). No antibiotic treatment is indicated for blood  culture contaminants. CRITICAL RESULT CALLED TO, READ BACK BY AND VERIFIED WITH: PHARMD K. HURTH [id number] @ 1910 FH    Staphylococcus lugdunensis NOT DETECTED NOT DETECTED Final   Streptococcus species NOT DETECTED NOT DETECTED Final   Streptococcus agalactiae NOT DETECTED NOT DETECTED Final   Streptococcus pneumoniae NOT DETECTED NOT DETECTED Final   Streptococcus pyogenes NOT DETECTED NOT DETECTED Final   A.calcoaceticus-baumannii NOT DETECTED NOT DETECTED Final   Bacteroides fragilis NOT DETECTED NOT DETECTED Final   Enterobacterales NOT DETECTED NOT DETECTED Final   Enterobacter  cloacae complex NOT DETECTED NOT DETECTED Final   Escherichia coli NOT DETECTED NOT DETECTED Final   Klebsiella aerogenes NOT DETECTED NOT DETECTED Final   Klebsiella oxytoca NOT DETECTED NOT DETECTED Final   Klebsiella pneumoniae NOT DETECTED NOT DETECTED Final   Proteus species NOT DETECTED NOT DETECTED Final   Salmonella species NOT DETECTED NOT DETECTED Final   Serratia marcescens NOT DETECTED NOT DETECTED Final   Haemophilus influenzae NOT DETECTED NOT DETECTED Final   Neisseria meningitidis NOT DETECTED NOT DETECTED Final   Pseudomonas aeruginosa NOT DETECTED NOT DETECTED Final   Stenotrophomonas maltophilia NOT DETECTED NOT DETECTED Final   Candida albicans NOT DETECTED NOT DETECTED Final   Candida  auris NOT DETECTED NOT DETECTED Final   Candida glabrata NOT DETECTED NOT DETECTED Final   Candida krusei NOT DETECTED NOT DETECTED Final   Candida parapsilosis NOT DETECTED NOT DETECTED Final   Candida tropicalis NOT DETECTED NOT DETECTED Final   Cryptococcus neoformans/gattii NOT DETECTED NOT DETECTED Final   Methicillin resistance mecA/C DETECTED (A) NOT DETECTED Final    Comment: CRITICAL RESULT CALLED TO, READ BACK BY AND VERIFIED WITH: PHARMD K. Catharine Clock [id number] @ 1910 FH Performed at [facility], [address], [place], [place] [address]      Studies: ECHOCARDIOGRAM COMPLETE Result Date: 01/17/2024    ECHOCARDIOGRAM REPORT   Patient Name:   Starlett K Kluttz Date of Exam: 01/17/2024 Medical Rec #:  [id number]       Height:       61.0 in Accession #:    [id number]      Weight:       144.6 lb Date of Birth:  May 24, 1946       BSA:          1.646 m Patient Age:    77 years        BP:           115/70 mmHg Patient Gender: F               HR:           122 bpm. Exam Location:  Inpatient Procedure: 2D Echo, Color Doppler and Cardiac Doppler (Both Spectral and Color            Flow Doppler were utilized during procedure). Indications:    CHF Acute Diastolic I50.31  History:        Patient has prior history of Echocardiogram examinations, most                 recent 03/21/2023.  Sonographer:    Placido Brightly RDCS Referring Phys: [id number] CAROLE N HALL IMPRESSIONS  1. Left ventricular ejection fraction, by estimation, is 70 to 75%. The left ventricle has hyperdynamic function. The left ventricle has no regional wall motion abnormalities. There is moderate concentric left ventricular hypertrophy. Left ventricular diastolic parameters are indeterminate.  2. Right ventricular systolic function is normal. The right ventricular size is moderately enlarged.  3. Left atrial size was severely dilated.  4. The mitral valve is normal in structure. Trivial mitral valve regurgitation. No evidence of mitral stenosis.  Severe mitral annular calcification.  5. Tricuspid valve regurgitation is moderate.  6. The aortic valve is tricuspid. There is moderate calcification of the aortic valve. Aortic valve regurgitation is not visualized. Visually expect at least moderate AS but aortic valve not well interrogated on this study. Will get repeat limited echo to further evalaute. FINDINGS  Left Ventricle: Left ventricular ejection fraction, by  estimation, is 70 to 75%. The left ventricle has hyperdynamic function. The left ventricle has no regional wall motion abnormalities. The left ventricular internal cavity size was normal in size. There is moderate concentric left ventricular hypertrophy. Left ventricular diastolic parameters are indeterminate. Right Ventricle: The right ventricular size is moderately enlarged. No increase in right ventricular wall thickness. Right ventricular systolic function is normal. Left Atrium: Left atrial size was severely dilated. Right Atrium: Right atrial size was normal in size. Pericardium: There is no evidence of pericardial effusion. Mitral Valve: The mitral valve is normal in structure. Severe mitral annular calcification. Trivial mitral valve regurgitation. No evidence of mitral valve stenosis. Tricuspid Valve: The tricuspid valve is normal in structure. Tricuspid valve regurgitation is moderate . No evidence of tricuspid stenosis. Aortic Valve: The aortic valve is tricuspid. There is moderate calcification of the aortic valve. Aortic valve regurgitation is not visualized. Visually expect at least moderate AS but aortic valve not well interrogated on this study. Will get repeat limited echo to further evalaute. Pulmonic Valve: The pulmonic valve was normal in structure. Pulmonic valve regurgitation is trivial. No evidence of pulmonic stenosis. Aorta: The aortic root is normal in size and structure. Venous: The inferior vena cava was not well visualized. IAS/Shunts: No atrial level shunt detected by  color flow Doppler.  LEFT VENTRICLE PLAX 2D LVIDd:         3.10 cm LVIDs:         2.40 cm LV PW:         1.50 cm LV IVS:        1.80 cm LVOT diam:     2.00 cm LVOT Area:     3.14 cm  RIGHT VENTRICLE TAPSE (M-mode): 1.2 cm LEFT ATRIUM         Index LA diam:    4.70 cm 2.86 cm/m   AORTA Ao Root diam: 3.40 cm Ao Asc diam:  3.40 cm  SHUNTS Systemic Diam: 2.00 cm Jules Oar MD Electronically signed by Jules Oar MD Signature Date/Time: 01/17/2024/5:49:38 PM    Final       Rosena Conradi, MD  [facility] 01/18/2024  If 7PM-7AM, please contact night-coverage

## 2024-01-18 NOTE — Evaluation (Signed)
 Occupational Therapy Evaluation and Discharge Patient Details Name: Selena Bush MRN: [id number] DOB: 1945-08-27 Today's Date: 01/18/2024   History of Present Illness   Selena Bush is a 78 y.o. female admitted 01/15/24 for stercoral colitis. Pt presented with decreased urine output and constipation for more than 8 days. UA was negative for pyuria. Had acute urinary retention and Foley catheter was placed. CT abdomen and pelvis revealed diffusely stool-filled colon consistent with constipation, stool-filled rectum with rectal wall thickening suggesting stercoral colitis, and bilateral perihilar infiltrates suggesting pneumonia or edema, cardiac enlargement. PMH significant for parkinson's disease, history of CVA (2024), paroxysmal A-fib on Eliquis , HTN, HLD, T2DM, and OSA on CPAP.     Clinical Impressions Pt requires total care for ADLs and mobility with use of lift equipment at baseline. Her family, long time friend, aides and [facility] provide excellent care of her.  No OT needs.      If plan is discharge home, recommend the following:   Two people to help with walking and/or transfers;Two people to help with bathing/dressing/bathroom;Assistance with cooking/housework;Assistance with feeding;Direct supervision/assist for medications management;Direct supervision/assist for financial management;Assist for transportation;Help with stairs or ramp for entrance     Functional Status Assessment   Patient has not had a recent decline in their functional status     Equipment Recommendations   Hoyer lift     Recommendations for Other Services         Precautions/Restrictions   Precautions Precautions: Fall Recall of Precautions/Restrictions: Impaired Restrictions Weight Bearing Restrictions Per Provider Order: No     Mobility Bed Mobility Overal bed mobility: Needs Assistance Bed Mobility: Supine to Sit, Sit to Supine, Rolling Rolling: Total assist, +2 for physical  assistance, +2 for safety/equipment   Supine to sit: Total assist, +2 for physical assistance, +2 for safety/equipment Sit to supine: Total assist, +2 for physical assistance, +2 for safety/equipment        Transfers                          Balance Overall balance assessment: Needs assistance Sitting-balance support: No upper extremity supported, Feet supported Sitting balance-Leahy Scale: Poor Sitting balance - Comments: Pt required mod-maxA to remain seated upright. She was able to tolerate very short durations of no assistance to stay upright, but began to lean posteriorly as she fatigued.                                   ADL either performed or assessed with clinical judgement   ADL Overall ADL's : At baseline                                             Vision Patient Visual Report: No change from baseline Additional Comments: pt with eyes closed much of session     Perception         Praxis         Pertinent Vitals/Pain Pain Assessment Pain Assessment: Faces Faces Pain Scale: Hurts little more Pain Location: generalized with movement Pain Descriptors / Indicators: Moaning Pain Intervention(s): Monitored during session, Repositioned     Extremity/Trunk Assessment Upper Extremity Assessment Upper Extremity Assessment: Generalized weakness (stiffness in R hand vs L, no functional use observed)   Lower Extremity Assessment Lower  Extremity Assessment: Defer to PT evaluation RLE Deficits / Details: Pt with globally limited ROM. Noted rigidity in multiple joints. No active LE muscle activation observed. RLE: Unable to fully assess due to pain LLE Deficits / Details: Pt with globally limited ROM. Noted rigidity in multiple joints. No active LE muscle activation observed. LLE: Unable to fully assess due to pain   Cervical / Trunk Assessment Cervical / Trunk Assessment: Kyphotic;Other exceptions (R side curvature,  weakness) Cervical / Trunk Exceptions: Pt with R-sided preference. Cervical neck maintained in flexion, R lateral flex, and L rotation.   Communication Communication Communication: Impaired Factors Affecting Communication: Difficulty expressing self   Cognition Arousal: Lethargic Behavior During Therapy: Flat affect Cognition: Difficult to assess Difficult to assess due to: Level of arousal           OT - Cognition Comments: minimally verbal at baseline                 Following commands: Impaired Following commands impaired:  (does not follow commands)     Cueing  General Comments   Cueing Techniques: Verbal cues;Tactile cues  VSS on 6L HFNC.   Exercises     Shoulder Instructions      Home Living Family/patient expects to be discharged to:: Private residence Living Arrangements: Non-relatives/Friends Available Help at Discharge: Family;Personal care attendant;Available PRN/intermittently Type of Home: House (townhome) Home Access: Ramped entrance     Home Layout: Able to live on main level with bedroom/bathroom     Bathroom Shower/Tub: Sponge bathes at baseline         Home Equipment: Lift chair;Wheelchair - Careers adviser (comment) (lift equipment)   Additional Comments: Pt recieves assistance from the [facility] program. Additionally, her family has hired multiple PCA to provide coverage throughout the day. The only time she doesn't have an aide is at night; however, her friend/roommate is with her in the evenings and sleeps beside her on the couch while the pt sleeps in a chair.      Prior Functioning/Environment Prior Level of Function : Needs assist       Physical Assist : Mobility (physical);ADLs (physical) Mobility (physical): Bed mobility;Transfers;Gait ADLs (physical): Feeding;Grooming;Bathing;Dressing;Toileting;IADLs Mobility Comments: Dependent. Relies on family, friend, PCA to transfer her to w/c using lift and to propel her in w/c. ADLs  Comments: Dependent. Relies on family, friend, PCA to complete all ADLs and IADLs. Family reports they know someone with a w/c [place] that can assist transporting the pt and she has been attending church.    OT Problem List:     OT Treatment/Interventions:        OT Goals(Current goals can be found in the care plan section)   Acute Rehab OT Goals OT Goal Formulation: With patient   OT Frequency:       Co-evaluation PT/OT/SLP Co-Evaluation/Treatment: Yes Reason for Co-Treatment: For patient/therapist safety;To address functional/ADL transfers;Necessary to address cognition/behavior during functional activity PT goals addressed during session: Mobility/safety with mobility;Balance OT goals addressed during session: ADL's and self-care;Strengthening/ROM      AM-PAC OT "6 Clicks" Daily Activity     Outcome Measure Help from another person eating meals?: Total Help from another person taking care of personal grooming?: Total Help from another person toileting, which includes using toliet, bedpan, or urinal?: Total Help from another person bathing (including washing, rinsing, drying)?: Total Help from another person to put on and taking off regular upper body clothing?: Total Help from another person to put on and taking off regular lower  body clothing?: Total 6 Click Score: 6   End of Session Nurse Communication: Need for lift equipment  Activity Tolerance: Patient limited by lethargy Patient left: in bed;with call bell/phone within reach  OT Visit Diagnosis: Muscle weakness (generalized) (M62.81);Other symptoms and signs involving cognitive function                Time: 4098-1191 OT Time Calculation (min): 34 min Charges:  OT General Charges $OT Visit: 1 Visit OT Evaluation $OT Eval Moderate Complexity: 1 Mod  Avanell Leigh, OTR/L Acute Rehabilitation Services Office: [phone]   Jonette Nestle 01/18/2024, 1:08 PM

## 2024-01-19 DIAGNOSIS — Z515 Encounter for palliative care: Secondary | ICD-10-CM | POA: Diagnosis not present

## 2024-01-19 DIAGNOSIS — K5289 Other specified noninfective gastroenteritis and colitis: Secondary | ICD-10-CM | POA: Diagnosis not present

## 2024-01-19 DIAGNOSIS — Z7189 Other specified counseling: Secondary | ICD-10-CM

## 2024-01-19 LAB — COMPREHENSIVE METABOLIC PANEL WITH GFR
ALT: 17 U/L (ref 0–44)
AST: 44 U/L — ABNORMAL HIGH (ref 15–41)
Albumin: 2.2 g/dL — ABNORMAL LOW (ref 3.5–5.0)
Alkaline Phosphatase: 29 U/L — ABNORMAL LOW (ref 38–126)
Anion gap: 7 (ref 5–15)
BUN: 9 mg/dL (ref 8–23)
CO2: 30 mmol/L (ref 22–32)
Calcium: 8.6 mg/dL — ABNORMAL LOW (ref 8.9–10.3)
Chloride: 104 mmol/L (ref 98–111)
Creatinine, Ser: 0.68 mg/dL (ref 0.44–1.00)
GFR, Estimated: >60 mL/min (ref >60)
Glucose, Bld: 124 mg/dL — ABNORMAL HIGH (ref 70–99)
Potassium: 3.3 mmol/L — ABNORMAL LOW (ref 3.5–5.1)
Sodium: 141 mmol/L (ref 135–145)
Total Bilirubin: 0.9 mg/dL (ref 0.0–1.2)
Total Protein: 4.9 g/dL — ABNORMAL LOW (ref 6.5–8.1)

## 2024-01-19 LAB — CULTURE, BLOOD (ROUTINE X 2)

## 2024-01-19 LAB — CBC
HCT: 24.3 % — ABNORMAL LOW (ref 36.0–46.0)
Hemoglobin: 7.8 g/dL — ABNORMAL LOW (ref 12.0–15.0)
MCH: 31.1 pg (ref 26.0–34.0)
MCHC: 32.1 g/dL (ref 30.0–36.0)
MCV: 96.8 fL (ref 80.0–100.0)
Platelets: 163 10*3/uL (ref 150–400)
RBC: 2.51 MIL/uL — ABNORMAL LOW (ref 3.87–5.11)
RDW: 13.2 % (ref 11.5–15.5)
WBC: 11.8 10*3/uL — ABNORMAL HIGH (ref 4.0–10.5)
nRBC: 0 % (ref 0.0–0.2)

## 2024-01-19 LAB — MAGNESIUM: Magnesium: 1.9 mg/dL (ref 1.7–2.4)

## 2024-01-19 MED ORDER — BISACODYL 10 MG RE SUPP: 10.0000 mg | Freq: Every day | RECTAL | Status: DC | PRN

## 2024-01-19 MED ORDER — HALOPERIDOL LACTATE 2 MG/ML PO CONC: 2.0000 mg | Freq: Four times a day (QID) | ORAL | Status: DC | PRN

## 2024-01-19 MED ORDER — DIPHENHYDRAMINE HCL 50 MG/ML IJ SOLN: 25.0000 mg | INTRAMUSCULAR | Status: DC | PRN

## 2024-01-19 MED ORDER — LORAZEPAM 1 MG PO TABS: 1.0000 mg | ORAL_TABLET | ORAL | Status: DC | PRN

## 2024-01-19 MED ORDER — BIOTENE DRY MOUTH MT LIQD
15.0000 mL | Freq: Two times a day (BID) | OROMUCOSAL | Status: DC
  Administered 2024-01-19 – 2024-01-20 (×4): 15 mL via TOPICAL

## 2024-01-19 MED ORDER — ACETAMINOPHEN 325 MG PO TABS: 650.0000 mg | ORAL_TABLET | Freq: Four times a day (QID) | ORAL | Status: DC | PRN

## 2024-01-19 MED ORDER — ONDANSETRON HCL 4 MG/2ML IJ SOLN: 4.0000 mg | Freq: Four times a day (QID) | INTRAMUSCULAR | Status: DC | PRN

## 2024-01-19 MED ORDER — GLYCOPYRROLATE 0.2 MG/ML IJ SOLN: 0.2000 mg | INTRAMUSCULAR | Status: DC | PRN

## 2024-01-19 MED ORDER — HALOPERIDOL LACTATE 5 MG/ML IJ SOLN: 2.0000 mg | INTRAMUSCULAR | Status: DC | PRN

## 2024-01-19 MED ORDER — HALOPERIDOL 1 MG PO TABS: 2.0000 mg | ORAL_TABLET | Freq: Four times a day (QID) | ORAL | Status: DC | PRN

## 2024-01-19 MED ORDER — ACETAMINOPHEN 650 MG RE SUPP: 650.0000 mg | Freq: Four times a day (QID) | RECTAL | Status: DC | PRN

## 2024-01-19 MED ORDER — LORAZEPAM 2 MG/ML PO CONC: 1.0000 mg | ORAL | Status: DC | PRN

## 2024-01-19 MED ORDER — GLYCOPYRROLATE 0.2 MG/ML IJ SOLN
0.2000 mg | INTRAMUSCULAR | Status: DC | PRN
  Administered 2024-01-19 – 2024-01-21 (×3): 0.2 mg via INTRAVENOUS
  Filled 2024-01-19 (×3): qty 1

## 2024-01-19 MED ORDER — LORAZEPAM 2 MG/ML IJ SOLN
1.0000 mg | INTRAMUSCULAR | Status: DC | PRN
  Filled 2024-01-19: qty 1

## 2024-01-19 MED ORDER — POLYVINYL ALCOHOL 1.4 % OP SOLN: 1.0000 [drp] | Freq: Four times a day (QID) | OPHTHALMIC | Status: DC | PRN

## 2024-01-19 MED ORDER — GLYCOPYRROLATE 1 MG PO TABS: 1.0000 mg | ORAL_TABLET | ORAL | Status: DC | PRN

## 2024-01-19 MED ORDER — ONDANSETRON 4 MG PO TBDP: 4.0000 mg | ORAL_TABLET | Freq: Four times a day (QID) | ORAL | Status: DC | PRN

## 2024-01-19 MED ORDER — HYDROMORPHONE HCL 1 MG/ML IJ SOLN
0.5000 mg | INTRAMUSCULAR | Status: DC | PRN
  Administered 2024-01-19: 1 mg via INTRAVENOUS
  Filled 2024-01-19 (×2): qty 1

## 2024-01-19 MED ORDER — POTASSIUM CHLORIDE 10 MEQ/100ML IV SOLN
10.0000 meq | INTRAVENOUS | Status: DC
  Filled 2024-01-19: qty 100

## 2024-01-19 NOTE — Plan of Care (Signed)
  Problem: Health Behavior/Discharge Planning: Goal: Ability to manage health-related needs will improve Outcome: Progressing   Problem: Clinical Measurements: Goal: Ability to maintain clinical measurements within normal limits will improve Outcome: Progressing Goal: Diagnostic test results will improve Outcome: Progressing Goal: Cardiovascular complication will be avoided Outcome: Progressing   Problem: Elimination: Goal: Will not experience complications related to bowel motility Outcome: Progressing

## 2024-01-19 NOTE — Progress Notes (Deleted)
 HPI: Follow-up diastolic congestive heart failure, paroxysmal atrial fibrillation, pericardial effusion, hypertension and hyperlipidemia. Patient was admitted March 2019 with atypical chest pain and paroxysmal atrial fibrillation. She was treated with sotalol  and Xarelto . However she returned with significant dyspnea and follow-up echocardiogram showed large pericardial effusion with tamponade physiology.  She had pericardiocentesis on April 1. Cytology was negative.  At that time we felt it was likely her initial presentation was pericarditis and she had a hemorrhagic pericardial effusion from initiation of anticoagulation.  We felt her atrial fibrillation may improve once pericardial process resolved. Carotid Dopplers April 2024 showed 1 to 39% bilateral stenosis.  Brain MRI May 2024 showed subacute infarct in the right precentral gyrus.  Monitor July 2024 showed sinus rhythm with short episodes of paroxysmal atrial fibrillation.  Admitted June 2025 with acute on chronic respiratory failure.  She was felt to have CHF and diuresed.  Also felt to have possible pneumonia treated with antibiotics.  Echocardiogram January 17, 2024 showed normal LV function, moderate right ventricular enlargement, severe left atrial enlargement, moderate tricuspid regurgitation, mild aortic stenosis with mean gradient 11 mmHg.  Ultimately patient was made no CODE BLUE.  Since last seen   No current facility-administered medications for this visit.   No current outpatient medications on file.   Facility-Administered Medications Ordered in Other Visits  Medication Dose Route Frequency Provider Last Rate Last Admin   acetaminophen  (TYLENOL ) suppository 650 mg  650 mg Rectal Q4H PRN Pokhrel, Laxman, MD   650 mg at 01/18/24 0936   bisacodyl  (DULCOLAX) suppository 10 mg  10 mg Rectal Daily Pokhrel, Laxman, MD       Carbidopa -Levodopa  ER 36.25-145 MG CPCR 3 capsule  3 capsule Oral 3 times per day Pokhrel, Laxman, MD   3 capsule  at 01/17/24 0900   And   Carbidopa -Levodopa  ER 36.25-145 MG CPCR 1 capsule  1 capsule Oral QHS Pokhrel, Laxman, MD       ceFEPIme (MAXIPIME) 2 g in sodium chloride  0.9 % 100 mL IVPB  2 g Intravenous Q12H Pokhrel, Laxman, MD   Stopped at 01/19/24 0241   Chlorhexidine  Gluconate Cloth 2 % PADS 6 each  6 each Topical Daily Pokhrel, Laxman, MD   6 each at 01/18/24 1503   dextrose  5 % and 0.9% NaCl 1,000 mL with potassium chloride  10 mEq/L Pediatric IV infusion   Intravenous Continuous Pokhrel, Laxman, MD 50 mL/hr at 01/19/24 0549 Infusion Verify at 01/19/24 0549   diltiazem  (CARDIZEM ) 125 mg in dextrose  5% 125 mL (1 mg/mL) infusion  5-15 mg/hr Intravenous Titrated Pokhrel, Laxman, MD   Stopped at 01/18/24 1238   enoxaparin  (LOVENOX ) injection 60 mg  1 mg/kg Subcutaneous Q12H Reome, Earle J, RPH   60 mg at 01/18/24 2132   gabapentin  (NEURONTIN ) capsule 600 mg  600 mg Oral QHS Pokhrel, Laxman, MD       Gerhardt's butt cream   Topical BID Pokhrel, Laxman, MD   Given at 01/18/24 2134   metoprolol  tartrate (LOPRESSOR ) injection 2.5 mg  2.5 mg Intravenous Q4H PRN Pokhrel, Laxman, MD   2.5 mg at 01/17/24 1043   metroNIDAZOLE  (FLAGYL ) IVPB 500 mg  500 mg Intravenous Q12H Pokhrel, Laxman, MD   Stopped at 01/19/24 0449   ondansetron  (ZOFRAN ) injection 4 mg  4 mg Intravenous Q6H PRN Pokhrel, Laxman, MD       sorbitol, magnesium  hydroxide, mineral oil, glycerin (SMOG) enema  960 mL Rectal Once Pokhrel, Laxman, MD  sotalol  (BETAPACE ) tablet 120 mg  120 mg Oral BID Pokhrel, Laxman, MD   120 mg at 01/17/24 1048   vancomycin  (VANCOREADY) IVPB 750 mg/150 mL  750 mg Intravenous Q36H Ladona Pickles, RPH   Stopped at 01/19/24 0145     Past Medical History:  Diagnosis Date   Anxiety    Atrial fibrillation (HCC)    Cat bite of right hand 03/25/2019   Chronic diastolic CHF (congestive heart failure) (HCC)    a. echo 09/2014: EF 60-65%, diastolic dysfunction, mild LVH, nl RV size & systolic function, mildly  dilated LA (4.3 cm), mild MR/TR, mildly elevated PASP 36.7 mm Hg   DDD (degenerative disc disease), cervical    DDD (degenerative disc disease), lumbar    Depression    Diffuse cystic mastopathy 2014   Dyspnea    Dysrhythmia    GERD (gastroesophageal reflux disease)    Gout    Headache    rare   HLD (hyperlipidemia)    a. statin intolerant 2/2 myalgias   Hx of dysplastic nevus 12/25/2018   L medial ankle   Hypercholesterolemia    Hypertension    Malignant neoplasm of upper-outer quadrant of female breast (HCC) 10/2012   Papillary DCIS, sentinel node negative. DR/PR positive. PARTIAL RIGHT MASTECTOMY FOR BREAST CANCER--HAD RADIATION - NO CHEMO --DR. CRYSTAL [place] ONCOLOGIST   Obesity    OSA on CPAP    Osteoarthritis of both knees    a. s/p right TKA 04/2013 & left TKA 09/2014   Otitis externa    Parkinson disease [facility])    Personal history of radiation therapy 2015   RIGHT breast-mammosite per pt   Pneumonia of both lungs due to infectious organism 01/08/2021   Sleep difficulties    LUNESTA HAS HELPED   Vaginal cyst     Past Surgical History:  Procedure Laterality Date   ABDOMINAL HYSTERECTOMY  1992   DUB; fibroids; endometriosis.  One remaining ovary.     BREAST LUMPECTOMY Right 2015   Papillary DCIS, sentinel node negative. DR/PR positive. PARTIAL RIGHT MASTECTOMY FOR BREAST CANCER--HAD RADIATION - NO CHEMO --DR. CRYSTAL [place] ONCOLOGIST   BREAST SURGERY Right 10/2012   Wide excision,APB RT 10 mm papillary DCIS, ER/PR positive. Sentinel node negative. Partial breast radiation.   CATARACT EXTRACTION, BILATERAL  02/13/2016   [facility].   CHOLECYSTECTOMY  1994   COLONOSCOPY  2015   1 benign polyp-every 5 years/ Dr Janine Melbourne   COLONOSCOPY WITH PROPOFOL  N/A 02/10/2021   Procedure: COLONOSCOPY WITH PROPOFOL ;  Surgeon: Marshall Skeeter, MD;  Location: [facility] ENDOSCOPY;  Service: Endoscopy;  Laterality: N/A;   ERCP  1995   JOINT REPLACEMENT Right 04/2013   knee    PERICARDIOCENTESIS N/A 11/13/2017   Procedure: PERICARDIOCENTESIS;  Surgeon: Sammy Crisp, MD;  Location: [facility] INVASIVE CV LAB;  Service: Cardiovascular;  Laterality: N/A;   TOTAL KNEE ARTHROPLASTY Right 04/16/2013   Procedure: RIGHT TOTAL KNEE ARTHROPLASTY;  Surgeon: Bevin Bucks, MD;  Location: [facility] ORS;  Service: Orthopedics;  Laterality: Right;   TOTAL KNEE ARTHROPLASTY Left 09/15/2014   Procedure: LEFT TOTAL KNEE ARTHROPLASTY;  Surgeon: Bevin Bucks, MD;  Location: [facility] ORS;  Service: Orthopedics;  Laterality: Left;   TUBAL LIGATION  1979   UPPER GI ENDOSCOPY      Social History   Socioeconomic History   Marital status: Widowed    Spouse name: Aimee Houseman   Number of children: 2   Years of education: College   Highest education level: Bachelor's degree (  e.g., BA, AB, BS)  Occupational History   Occupation: Retired  Tobacco Use   Smoking status: Never   Smokeless tobacco: Never   Tobacco comments:    social smoker as a teen  Advertising account planner   Vaping status: Never Used  Substance and Sexual Activity   Alcohol use: No   Drug use: No   Sexual activity: Not Currently    Birth control/protection: Post-menopausal, Surgical  Other Topics Concern   Not on file  Social History Narrative   Marital status: widowed since 09/16/2015      Children: 2 children (45, 70); 5 grandchild (4 in [place]; 1 in [place]).      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  Walking daily small amounts.        ADLs: drives; independent with ADLs; no assistant devices      Advanced Directives: none; FULL CODE; no prolonged measures.   Does not need them.  Daughter/Heather Porter Britain is HCPOA.    Social Drivers of Corporate investment banker Strain: Low Risk  (01/06/2022)   Overall Financial Resource Strain (CARDIA)    Difficulty of Paying  Living Expenses: Not hard at all  Food Insecurity: No Food Insecurity (01/06/2022)   Hunger Vital Sign    Worried About Running Out of Food in the Last Year: Never true    Ran Out of Food in the Last Year: Never true  Transportation Needs: No Transportation Needs (01/06/2022)   PRAPARE - Administrator, Civil Service (Medical): No    Lack of Transportation (Non-Medical): No  Physical Activity: Insufficiently Active (01/06/2022)   Exercise Vital Sign    Days of Exercise per Week: 7 days    Minutes of Exercise per Session: 10 min  Stress: No Stress Concern Present (01/06/2022)   [employer] of Occupational Health - Occupational Stress Questionnaire    Feeling of Stress : Not at all  Social Connections: Moderately Integrated (10/02/2017)   Social Connection and Isolation Panel [NHANES]    Frequency of Communication with Friends and Family: More than three times a week    Frequency of Social Gatherings with Friends and Family: More than three times a week    Attends Religious Services: More than 4 times per year    Active Member of [employer] or Organizations: Yes    Attends Banker Meetings: More than 4 times per year    Marital Status: Widowed  Intimate Partner Violence: Not At Risk (10/02/2017)   Humiliation, Afraid, Rape, and Kick questionnaire    Fear of Current or Ex-Partner: No    Emotionally Abused: No    Physically Abused: No    Sexually Abused: No    Family History  Problem Relation Age of Onset   Colon cancer Mother    Cancer Mother 61       colon cancer   Melanoma Father    Cancer Father 64       melanoma   Melanoma Sister    Melanoma Sister    Colon cancer Maternal Grandfather    Breast cancer Maternal Grandfather     ROS: no fevers or chills, productive cough, hemoptysis, dysphasia, odynophagia, melena, hematochezia, dysuria, hematuria, rash,  seizure activity, orthopnea, PND, pedal edema, claudication. Remaining systems are  negative.  Physical Exam: Well-developed well-nourished in no acute distress.  Skin is warm and dry.  HEENT is normal.  Neck is supple.  Chest is clear to auscultation with normal expansion.  Cardiovascular exam is regular rate and rhythm.  Abdominal exam nontender or distended. No masses palpated. Extremities show no edema. neuro grossly intact  ECG- personally reviewed  A/P  1 chronic diastolic congestive heart failure-  2 paroxysmal atrial fibrillation-  3 recent admission with respiratory failure-  4 hypertension-  5 hyperlipidemia-  6 history of Parkinson's-  7 no CODE BLUE  Alexandria Angel, MD

## 2024-01-19 NOTE — Progress Notes (Signed)
 SLP Cancellation Note  Patient Details Name: SHAMYIA GRANDPRE MRN: [id number] DOB: 26-Apr-1946   Cancelled treatment:      SLP at bedside. Multiple family members present and visiting with pt.  SLP provided brief education on option for swallow assessment and any education needed on comfort diet recommendations or careful-hand feeding strategies.  Other option is for SLP to step back and discontinue orders given recent transition to comfort care, which will permit less disruption in precious family time. Family politely declined swallow assessment or intervention at this time. SLP will sign off. Defer to physician and family to decide on comfort diet measures as desired. Re-consult SLP if family wishes for clinical swallow assessment and/or any education on swallow safety measures.    Zephaniah Lubrano J Alpha Chouinard 01/19/2024, 11:09 AM

## 2024-01-19 NOTE — Consult Note (Signed)
 Palliative Care Consult Note                                  Date: 01/19/2024   Patient Name: Selena Bush  DOB: 11-01-1945  MRN: [id number]  Age / Sex: 78 y.o., female  PCP: [facility] Referring Physician: Rosena Conradi, MD  Reason for Consultation: Establishing goals of care  HPI/Patient Profile: 78 y.o. female  with past medical history of Parkinson's disease, history of CVA (2024), paroxysmal A-fib on Eliquis , hypertension, hyperlipidemia, type 2 diabetes, and OSA on CPAP admitted on 01/15/2024 with decreased urine output despite increase dose of her diuretics by her cardiologist 2 days ago. Patient also reported constipation for more than 8 days.   UA was negative for pyuria. Had acute urinary retention and Foley catheter was placed. CT abdomen and pelvis revealed diffusely stool-filled colon consistent with constipation, stool-filled rectum with rectal wall thickening suggesting stercoral colitis, and bilateral perihilar infiltrates suggesting pneumonia or edema, cardiac enlargement.  Past Medical History:  Diagnosis Date   Anxiety    Atrial fibrillation (HCC)    Cat bite of right hand 03/25/2019   Chronic diastolic CHF (congestive heart failure) (HCC)    a. echo 09/2014: EF 60-65%, diastolic dysfunction, mild LVH, nl RV size & systolic function, mildly dilated LA (4.3 cm), mild MR/TR, mildly elevated PASP 36.7 mm Hg   DDD (degenerative disc disease), cervical    DDD (degenerative disc disease), lumbar    Depression    Diffuse cystic mastopathy 2014   Dyspnea    Dysrhythmia    GERD (gastroesophageal reflux disease)    Gout    Headache    rare   HLD (hyperlipidemia)    a. 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 DR/PR positive. PARTIAL RIGHT MASTECTOMY FOR BREAST CANCER--HAD RADIATION - NO CHEMO --DR. CRY[facility] ONCOLOGIST   Obesity    OSA on CPAP    Osteoarthritis of both knees    a. s/p right TKA 04/2013 & left TKA 09/2014   Otitis externa    Parkinson disease [facility])    Personal history of radiation therapy 2015   RIGHT breast-mammosite per pt   Pneumonia of both lungs due to infectious organism 01/08/2021   Sleep difficulties    LUNESTA HAS HELPED   Vaginal cyst     Subjective:   I have reviewed medical records including EPIC notes, labs and imaging, received updates from bedside RN and Dr. Efrain Grant, assessed the patient and then met with the patient's children Heather and Lewis (by phone) and family friend to discuss diagnosis prognosis, GOC, EOL wishes, disposition and options.  I introduced Palliative Medicine as specialized medical care for people living with serious illness. It focuses on providing relief from symptoms and stress of a serious illness. The goal is to improve quality of life for both the patient and the family.  Today's Discussion: Met and family room with patient's daughter Wally Gunnels best friend, and patient's son Ulis Games (by phone).  We discussed patient's chronic conditions including her Parkinson's disease which has progressed over the last years and especially over the last couple months. We discussed her current hospitalization, deteriorating clinical status, and  lethargy.   The patient's family shared they had a detailed discussion with the attending physician about these issues earlier this week when they changed her code status to DNR/DNI. The family shared the patient has not had any improvement and they suspect her health status will continue to decline.  The patient has always been clear that quality of life and comfort were important to her.  We discussed what a transition to comfort measures would look like for this patient. We  discussed that once transitioned to comfort measures the patient would no longer receive aggressive medical interventions such as continuous vital signs, lab work, radiology testing, or medications not focused on comfort. All care would focus on how the patient is looking and feeling. This would include management of any symptoms that may cause discomfort, pain, shortness of breath, cough, nausea, agitation, anxiety, and/or secretions etc. Symptoms would be managed with medications and other non-pharmacological interventions. Family verbalized understanding and appreciation. They would like to transition the patient to comfort measures at this time.  We discussed taking the patient off BiPAP and returning her to nasal cannula.  We discussed eventually weaning the nasal cannula but we will discuss this before proceeding.  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 After this experience she had shared with family that she would never want to go to a nursing home.  They have been able to do this with the help of family, pace, and friends.  Patient has had a decline in function over the last few weeks to months. She was still going to church this week but required a wheelchair. The patient was a very religious woman and is not afraid of dying but would not want to suffer at end-of-life. They declined chaplain services at this time as the patient was anointed earlier this week by her priest and the patient's son Ulis Games is a Optician, dispensing. The patient's family share that it is a blessing that all the patient's family is in town for a family event and will be able to visit patient at bedside.  Discussed the importance of continued conversation with family and the medical providers regarding overall plan of care and treatment options, ensuring decisions are within the context of the patient's values and GOCs.  Questions and concerns were addressed. The family was encouraged to call  with questions or concerns. PMT will continue to support holistically.  Review of Systems  Unable to perform ROS   Objective:   Primary Diagnoses: Present on Admission:  Stercoral colitis  History of ductal carcinoma in situ (DCIS) of breast  Essential hypertension, benign  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 (BMI) 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 (HCC)  Chronic diastolic CHF (congestive heart failure) (HCC)  HLD (hyperlipidemia)   Physical Exam Vitals reviewed.  Constitutional:      General: She is not in acute distress.    Appearance: She is ill-appearing.  HENT:     Head: Normocephalic and atraumatic.  Cardiovascular:     Rate and Rhythm: Normal rate.  Pulmonary:     Comments: BiPAP Skin:    General: Skin is warm and dry.     Findings: Bruising present.  Neurological:     Mental Status: She is lethargic.     Vital Signs:  BP 133/72   Pulse 71   Temp 98.7 F (37.1 C) (Oral)   Resp 18   Ht 5\' 1"  (1.549 m)   Wt 100.2 kg  SpO2 99%   BMI 41.76 kg/m   Palliative Assessment/Data: 10%    Advanced Care Planning:   Existing Vynca/ACP Documentation: None  Primary Decision Maker: NEXT OF KIN: Patient's children Jacqulin Maus and Evelynn Hirschfeld  Code Status/Advance Care Planning: DNR   Assessment & Plan:   SUMMARY OF RECOMMENDATIONS   DNR- Comfort measures Comfort orders per [facility]  PMT will continue to follow  Symptom Management:  Tylenol  PRN for fever/pain Dilaudid  PRN for pain/air hunger/comfort Robinul PRN for excessive secretions Ativan  PRN for agitation/anxiety Zofran  PRN for nausea Liquifilm tears PRN for dry eyes Haldol PRN for agitation/anxiety May have comfort feeding Comfort cart for family Unrestricted visitations in the setting of EOL (per policy) Oxygen  for comfort. No escalation of oxygen .     Discussed with: Dr. Efrain Grant, RRT, and bedside RN  Time Total: 75 minutes    Thank you for allowing us  to participate in the  care of Krystyl K Mcglory PMT will continue to support holistically.  Signed by: Joaquim Muir, NP Palliative Medicine Team  Team Phone # [phone] (Nights/Weekends)  01/19/2024, 10:27 AM

## 2024-01-19 NOTE — Progress Notes (Signed)
 PROGRESS NOTE  Selena Bush GNF:[id number] DOB: [birth year] DOA: 01/15/2024 PCP: [facility]   LOS: 3 days   Brief narrative:   Selena Bush is a 78 y.o. female with medical history significant for Parkinson's disease, history of CVA (2024), paroxysmal A-fib on Eliquis , hypertension, hyperlipidemia, type 2 diabetes, OSA on CPAP, presented to the hospital with decreased urine output despite increasing dose of her diuretics by her cardiologist 2 days ago.  Patient also reported constipation for more than 8 days.  In the ED, urinalysis was negative for pyuria but had significant urinary retention and Foley catheter was placed.  CT scan of the abdomen and pelvis showed diffusely filled colon consistent with constipation.  There was some evidence of stercoral colitis.  Bilateral perihilar infiltrates were also noted.  patient was somnolent with abnormal VBG with pH of 7.2 and PCO2 of 88.  Labs were notable for sodium of 132 with creatinine of 1.1.  Patient was put on BiPAP for CO2 narcosis.  Patient was then admitted hospital for further evaluation and treatment.   Assessment/Plan: Principal Problem:   Stercoral colitis Active Problems:   History of ductal carcinoma in situ (DCIS) of breast   Essential hypertension, benign   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 (BMI) 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 (HCC)   Chronic diastolic CHF (congestive heart failure) (HCC)   HLD (hyperlipidemia)  Stercoral colitis Severe constipation. Constipation for 8 days prior to presentation..  Received enema in the hospital and had several bowel movements.     Acute on chronic hypoxic and hypercarbic respiratory failure Patient uses CPAP nightly for sleep apnea.  Initially required BiPAP for CO2 narcosis.  Unclear whether patient is compliant with BiPAP at home or not.  Patient was previously put on BiPAP in the hospital but despite that appears to be extremely lethargic.  At  this time patient has been seen by palliative care as well.  Plan is comfort care.  Plan is not to proceed with any aggressive treatment.  Fever.  Possibly secondary to pneumonia.  Question aspiration.   Was initially on vancomycin  cefepime and metronidazole .   Had some perihilar infiltrates on the CT scan of the abdomen and pelvis.  Antibiotics have been discontinued for comfort care  Hypokalemia.  Replenished during hospitalization.  Latest potassium of 3.3.   Acute metabolic encephalopathy secondary to CO2 narcosis Patient still very somnolent and lethargic.  Initial VBG pH 7.26 with pCO2 88.9.  ABG also showed hypercarbia initially.  Initial interventions included BiPAP.  . CT head without any acute findings.  At this time plan is to proceed with comfort care.   Paroxysmal A-fib on Eliquis  with RVR Was on Lovenox .  Sotalol  was ordered but unable to take p.o. was briefly put on IV metoprolol  and IV Cardizem .  At this time transition to comfort care.  Parkinson's disease   Failed swallow evaluation.  Seen by speech therapy.  Unable to give Sinemet  orally.   Hypotension Received albumin initially..  Likely secondary to autonomic dysfunction from Parkinson's disease.    Sinus bradycardia On presentation.  Subsequently improved    Acute on chronic chronic HFpEF with grade 2 diastolic dysfunction BNP greater than 400 on presentation..  Chest x-ray shows some vascular congestion.  Blood pressure was low on admission.  Currently has been transitioned to to comfort care.    Acute urinary retention Foley catheter placed on 01/16/2024 continue to monitor urinary output.  Likely secondary to  constipation.  Now on comfort care.   Generalized weakness Transitioning to comfort care.   Hyperlipidemia On Zetia  at home.   Chronic anxiety and depression Transition to comfort care   Polyneuropathy Gabapentin  at home.   QTc prolongation   Deconditioning, debility, lethargic, deteriorating  overall clinical status.   I had a prolonged discussion with the patient's family regarding goals of care.  Palliative care on board as well and decision at this point is to proceed with comfort care.  DVT prophylaxis: Was Lovenox  but discontinued for comfort  Disposition: Uncertain at this time likely in the hospital death versus hospice  Status is: Inpatient Remains inpatient appropriate because: Pending clinical improvement, deteriorating clinical status    Code Status:     Code Status: Do not attempt resuscitation (DNR) - Comfort care  Family Communication:  Spoke with the patient's daughter 01/18/2024  Consultants: Palliative care  Procedures: BiPAP  Anti-infectives:   vancomycin  cefepime and Flagyl .  Discontinued   Subjective: Today, patient was seen and examined at bedside.  Patient still with significant lethargy and decreased responsiveness.  Has not improved despite being in the hospital.  Seen by palliative care and family wishing to transition to comfort care.  Only moaning and groaning response.  Objective: Vitals:   01/19/24 0800 01/19/24 1000  BP: 133/68 133/72  Pulse: 71 71  Resp: 16 18  Temp:    SpO2: 98% 99%    Intake/Output Summary (Last 24 hours) at 01/19/2024 1257 Last data filed at 01/19/2024 0549 Gross per 24 hour  Intake 1600.02 ml  Output 750 ml  Net 850.02 ml   Filed Weights   01/17/24 0150 01/18/24 0441 01/19/24 0400  Weight: 65.6 kg 67.5 kg 100.2 kg   Body mass index is 41.76 kg/m.   Physical Exam:  GENERAL: Patient is lethargic, minimal response to deep stimuli, moaning and grunting only., elderly female appears weak frail and deconditioned.  On 6 L of high flow nasal cannula HENT: Patient azactam NECK: is supple, no gross swelling noted. CHEST:   Diminished breath sounds bilaterally.  Coarse breath sounds noted. CVS: S1 and S2 heard, no murmur.  Irregularly irregular rhythm  ABDOMEN: Soft, non-tender, bowel sounds are  present. EXTREMITIES: No edema  CNS: Lethargic, unresponsive SKIN: warm and dry   Data Review: I have personally reviewed the following laboratory data and studies,  CBC: Recent Labs  Lab 01/15/24 2224 01/16/24 0148 01/16/24 0610 01/16/24 7846 01/16/24 0834 01/17/24 0225 01/17/24 0520 01/18/24 0226 01/19/24 0230  WBC 8.9  --  9.0  --   --  7.7  --  11.4* 11.8*  HGB 13.1   < > 10.9*   < > 10.5* 9.3* 8.5* 9.3* 7.8*  HCT 39.2   < > 35.5*   < > 31.0* 28.9* 25.0* 28.4* 24.3*  MCV 92.7  --  98.6  --   --  95.4  --  94.7 96.8  PLT 161  --  192  --   --  190  --  171 163   < > = values in this interval not displayed.   Basic Metabolic Panel: Recent Labs  Lab 01/15/24 2224 01/16/24 0148 01/16/24 0220 01/16/24 9629 01/16/24 5284 01/17/24 0225 01/17/24 0520 01/18/24 0226 01/19/24 0230  NA 132*   < > 135   < > 135 134* 134* 136 141  K 4.2   < > 3.8   < > 3.8 3.4* 3.3* 3.5 3.3*  CL 90*  --  91*  --   --  93*  --  100 104  CO2 28  --  36*  --   --  30  --  26 30  GLUCOSE 103*  --  110*  --   --  109*  --  128* 124*  BUN 14  --  14  --   --  15  --  12 9  CREATININE 1.13*  --  1.12*  --   --  1.20*  --  0.93 0.68  CALCIUM 9.6  --  9.8  --   --  9.2  --  8.9 8.6*  MG  --   --  2.0  --   --  1.8  --  1.9 1.9  PHOS  --   --  4.4  --   --   --   --   --   --    < > = values in this interval not displayed.   Liver Function Tests: Recent Labs  Lab 01/15/24 2224 01/19/24 0230  AST 21 44*  ALT QUANTITY NOT SUFFICIENT, UNABLE TO PERFORM TEST 17  ALKPHOS 47 29*  BILITOT 0.8 0.9  PROT QUANTITY NOT SUFFICIENT, UNABLE TO PERFORM TEST 4.9*  ALBUMIN 3.4* 2.2*   Recent Labs  Lab 01/15/24 2224  LIPASE 30   No results for input(s): "AMMONIA" in the last 168 hours. Cardiac Enzymes: No results for input(s): "CKTOTAL", "CKMB", "CKMBINDEX", "TROPONINI" in the last 168 hours. BNP (last 3 results) Recent Labs    12/22/23 1533 01/16/24 0054  BNP 135.4* 413.0*    ProBNP (last 3  results) No results for input(s): "PROBNP" in the last 8760 hours.  CBG: No results for input(s): "GLUCAP" in the last 168 hours. Recent Results (from the past 240 hours)  Culture, blood (Routine X 2) w Reflex to ID Panel     Status: Abnormal (Preliminary result)   Collection Time: 01/17/24 12:17 AM   Specimen: BLOOD LEFT HAND  Result Value Ref Range Status   Specimen Description BLOOD LEFT HAND  Final   Special Requests   Final    BOTTLES DRAWN AEROBIC AND ANAEROBIC Blood Culture results may not be optimal due to an inadequate volume of blood received in culture bottles   Culture  Setup Time   Final    GRAM POSITIVE COCCI ANAEROBIC BOTTLE ONLY CRITICAL RESULT CALLED TO, READ BACK BY AND VERIFIED WITH: PHARMD K. HURTH [id number] @ 1910 FH    Culture (A)  Final    STAPHYLOCOCCUS HOMINIS THE SIGNIFICANCE OF ISOLATING THIS ORGANISM FROM A SINGLE SET OF BLOOD CULTURES WHEN MULTIPLE SETS ARE DRAWN IS UNCERTAIN. PLEASE NOTIFY THE MICROBIOLOGY DEPARTMENT WITHIN ONE WEEK IF SPECIATION AND SENSITIVITIES ARE REQUIRED. Performed at [facility], [address], [place], [place] [address]    Report Status PENDING  Incomplete  Culture, blood (Routine X 2) w Reflex to ID Panel     Status: Abnormal (Preliminary result)   Collection Time: 01/17/24 12:19 AM   Specimen: BLOOD LEFT HAND  Result Value Ref Range Status   Specimen Description BLOOD LEFT HAND  Final   Special Requests   Final    BOTTLES DRAWN AEROBIC AND ANAEROBIC Blood Culture adequate volume   Culture  Setup Time   Final    GRAM POSITIVE COCCI ANAEROBIC BOTTLE ONLY CRITICAL RESULT CALLED TO, READ BACK BY AND VERIFIED WITH: PHARMD K. HURTH [id number] @ 1910 FH    Culture (A)  Final    STAPHYLOCOCCUS EPIDERMIDIS THE SIGNIFICANCE OF ISOLATING  THIS ORGANISM FROM A SINGLE SET OF BLOOD CULTURES WHEN MULTIPLE SETS ARE DRAWN IS UNCERTAIN. PLEASE NOTIFY THE MICROBIOLOGY DEPARTMENT WITHIN ONE WEEK IF SPECIATION AND SENSITIVITIES ARE  REQUIRED. Performed at [facility], [address] [place], [place] [address]    Report Status PENDING  Incomplete  Blood Culture ID Panel (Reflexed)     Status: Abnormal   Collection Time: 01/17/24 12:19 AM  Result Value Ref Range Status   Enterococcus faecalis NOT DETECTED NOT DETECTED Final   Enterococcus Faecium NOT DETECTED NOT DETECTED Final   Listeria monocytogenes NOT DETECTED NOT DETECTED Final   Staphylococcus species DETECTED (A) NOT DETECTED Final    Comment: CRITICAL RESULT CALLED TO, READ BACK BY AND VERIFIED WITH: PHARMD K. HURTH [id number] @ 1910 FH    Staphylococcus aureus (BCID) NOT DETECTED NOT DETECTED Final   Staphylococcus epidermidis DETECTED (A) NOT DETECTED Final    Comment: Methicillin (oxacillin) resistant coagulase negative staphylococcus. Possible blood culture contaminant (unless isolated from more than one blood culture draw or clinical case suggests pathogenicity). No antibiotic treatment is indicated for blood  culture contaminants. CRITICAL RESULT CALLED TO, READ BACK BY AND VERIFIED WITH: PHARMD K. HURTH [id number] @ 1910 FH    Staphylococcus lugdunensis NOT DETECTED NOT DETECTED Final   Streptococcus species NOT DETECTED NOT DETECTED Final   Streptococcus agalactiae NOT DETECTED NOT DETECTED Final   Streptococcus pneumoniae NOT DETECTED NOT DETECTED Final   Streptococcus pyogenes NOT DETECTED NOT DETECTED Final   A.calcoaceticus-baumannii NOT DETECTED NOT DETECTED Final   Bacteroides fragilis NOT DETECTED NOT DETECTED Final   Enterobacterales NOT DETECTED NOT DETECTED Final   Enterobacter cloacae complex NOT DETECTED NOT DETECTED Final   Escherichia coli NOT DETECTED NOT DETECTED Final   Klebsiella aerogenes NOT DETECTED NOT DETECTED Final   Klebsiella oxytoca NOT DETECTED NOT DETECTED Final   Klebsiella pneumoniae NOT DETECTED NOT DETECTED Final   Proteus species NOT DETECTED NOT DETECTED Final   Salmonella species NOT DETECTED NOT DETECTED  Final   Serratia marcescens NOT DETECTED NOT DETECTED Final   Haemophilus influenzae NOT DETECTED NOT DETECTED Final   Neisseria meningitidis NOT DETECTED NOT DETECTED Final   Pseudomonas aeruginosa NOT DETECTED NOT DETECTED Final   Stenotrophomonas maltophilia NOT DETECTED NOT DETECTED Final   Candida albicans NOT DETECTED NOT DETECTED Final   Candida auris NOT DETECTED NOT DETECTED Final   Candida glabrata NOT DETECTED NOT DETECTED Final   Candida krusei NOT DETECTED NOT DETECTED Final   Candida parapsilosis NOT DETECTED NOT DETECTED Final   Candida tropicalis NOT DETECTED NOT DETECTED Final   Cryptococcus neoformans/gattii NOT DETECTED NOT DETECTED Final   Methicillin resistance mecA/C DETECTED (A) NOT DETECTED Final    Comment: CRITICAL RESULT CALLED TO, READ BACK BY AND VERIFIED WITH: PHARMD K. Catharine Clock [id number] @ 1910 FH Performed at [facility], [address] [place], [place] [address]      Studies: ECHOCARDIOGRAM LIMITED Result Date: 01/18/2024    ECHOCARDIOGRAM LIMITED REPORT   Patient Name:   Rhyli K Mceachin Date of Exam: 01/18/2024 Medical Rec #:  [id number]       Height:       61.0 in Accession #:    [id number]      Weight:       148.8 lb Date of Birth:  1946-02-24       BSA:          1.666 m Patient Age:    66 years  BP:           125/63 mmHg Patient Gender: F               HR:           60 bpm. Exam Location:  Inpatient Procedure: Limited Echo, Cardiac Doppler and Color Doppler (Both Spectral and            Color Flow Doppler were utilized during procedure). Indications:    Aortic Stenosis  History:        Patient has prior history of Echocardiogram examinations, most                 recent 01/17/2024. CHF; Risk Factors:Hypertension, Dyslipidemia                 and Sleep Apnea.  Sonographer:    Meagan Baucom RDCS, FE, PE Referring Phys: [id number] DANIEL R BENSIMHON  Sonographer Comments: Limited Echo to assess aortic valve IMPRESSIONS  1. Left ventricular ejection fraction, by  estimation, is 65 to 70%. The left ventricle has normal function. The left ventricle has no regional wall motion abnormalities.  2. Right ventricular systolic function is normal. The right ventricular size is normal.  3. The mitral valve is degenerative. Moderate mitral annular calcification.  4. Restricted motion of non coronary cusp of aortic valve. The aortic valve is calcified. There is moderate calcification of the aortic valve. There is moderate thickening of the aortic valve. Mild aortic valve stenosis. Aortic valve mean gradient measures 11.0 mmHg. Aortic valve Vmax measures 2.33 m/s. FINDINGS  Left Ventricle: Left ventricular ejection fraction, by estimation, is 65 to 70%. The left ventricle has normal function. The left ventricle has no regional wall motion abnormalities. Right Ventricle: The right ventricular size is normal. Right ventricular systolic function is normal. Mitral Valve: The mitral valve is degenerative in appearance. Moderate mitral annular calcification. Aortic Valve: Restricted motion of non coronary cusp of aortic valve. The aortic valve is calcified. There is moderate calcification of the aortic valve. There is moderate thickening of the aortic valve. Mild aortic stenosis is present. Aortic valve mean  gradient measures 11.0 mmHg. Aortic valve peak gradient measures 21.7 mmHg. Aortic valve area, by VTI measures 2.39 cm. LEFT VENTRICLE PLAX 2D LVOT diam:     2.00 cm LV SV:         103 LV SV Index:   62 LVOT Area:     3.14 cm  AORTIC VALVE AV Area (Vmax):    2.01 cm AV Area (Vmean):   2.22 cm AV Area (VTI):     2.39 cm AV Vmax:           233.00 cm/s AV Vmean:          137.000 cm/s AV VTI:            0.433 m AV Peak Grad:      21.7 mmHg AV Mean Grad:      11.0 mmHg LVOT Vmax:         149.00 cm/s LVOT Vmean:        96.850 cm/s LVOT VTI:          0.329 m LVOT/AV VTI ratio: 0.76  AORTA Ao Root diam: 3.40 cm  SHUNTS Systemic VTI:  0.33 m Systemic Diam: 2.00 cm Dorothye Gathers MD  Electronically signed by Dorothye Gathers MD Signature Date/Time: 01/18/2024/11:31:44 AM    Final    ECHOCARDIOGRAM COMPLETE Result Date: 01/17/2024    ECHOCARDIOGRAM REPORT  Patient Name:   Kathy K Bolio Date of Exam: 01/17/2024 Medical Rec #:  [id number]       Height:       61.0 in Accession #:    [id number]      Weight:       144.6 lb Date of Birth:  1946/04/03       BSA:          1.646 m Patient Age:    77 years        BP:           115/70 mmHg Patient Gender: F               HR:           122 bpm. Exam Location:  Inpatient Procedure: 2D Echo, Color Doppler and Cardiac Doppler (Both Spectral and Color            Flow Doppler were utilized during procedure). Indications:    CHF Acute Diastolic I50.31  History:        Patient has prior history of Echocardiogram examinations, most                 recent 03/21/2023.  Sonographer:    Placido Brightly RDCS Referring Phys: [id number] CAROLE N HALL IMPRESSIONS  1. Left ventricular ejection fraction, by estimation, is 70 to 75%. The left ventricle has hyperdynamic function. The left ventricle has no regional wall motion abnormalities. There is moderate concentric left ventricular hypertrophy. Left ventricular diastolic parameters are indeterminate.  2. Right ventricular systolic function is normal. The right ventricular size is moderately enlarged.  3. Left atrial size was severely dilated.  4. The mitral valve is normal in structure. Trivial mitral valve regurgitation. No evidence of mitral stenosis. Severe mitral annular calcification.  5. Tricuspid valve regurgitation is moderate.  6. The aortic valve is tricuspid. There is moderate calcification of the aortic valve. Aortic valve regurgitation is not visualized. Visually expect at least moderate AS but aortic valve not well interrogated on this study. Will get repeat limited echo to further evalaute. FINDINGS  Left Ventricle: Left ventricular ejection fraction, by estimation, is 70 to 75%. The left ventricle has hyperdynamic  function. The left ventricle has no regional wall motion abnormalities. The left ventricular internal cavity size was normal in size. There is moderate concentric left ventricular hypertrophy. Left ventricular diastolic parameters are indeterminate. Right Ventricle: The right ventricular size is moderately enlarged. No increase in right ventricular wall thickness. Right ventricular systolic function is normal. Left Atrium: Left atrial size was severely dilated. Right Atrium: Right atrial size was normal in size. Pericardium: There is no evidence of pericardial effusion. Mitral Valve: The mitral valve is normal in structure. Severe mitral annular calcification. Trivial mitral valve regurgitation. No evidence of mitral valve stenosis. Tricuspid Valve: The tricuspid valve is normal in structure. Tricuspid valve regurgitation is moderate . No evidence of tricuspid stenosis. Aortic Valve: The aortic valve is tricuspid. There is moderate calcification of the aortic valve. Aortic valve regurgitation is not visualized. Visually expect at least moderate AS but aortic valve not well interrogated on this study. Will get repeat limited echo to further evalaute. Pulmonic Valve: The pulmonic valve was normal in structure. Pulmonic valve regurgitation is trivial. No evidence of pulmonic stenosis. Aorta: The aortic root is normal in size and structure. Venous: The inferior vena cava was not well visualized. IAS/Shunts: No atrial level shunt detected by color flow Doppler.  LEFT VENTRICLE PLAX 2D LVIDd:  3.10 cm LVIDs:         2.40 cm LV PW:         1.50 cm LV IVS:        1.80 cm LVOT diam:     2.00 cm LVOT Area:     3.14 cm  RIGHT VENTRICLE TAPSE (M-mode): 1.2 cm LEFT ATRIUM         Index LA diam:    4.70 cm 2.86 cm/m   AORTA Ao Root diam: 3.40 cm Ao Asc diam:  3.40 cm  SHUNTS Systemic Diam: 2.00 cm Jules Oar MD Electronically signed by Jules Oar MD Signature Date/Time: 01/17/2024/5:49:38 PM    Final        Rosena Conradi, MD  [facility] 01/19/2024  If 7PM-7AM, please contact night-coverage

## 2024-01-19 NOTE — Progress Notes (Signed)
 PHARMACY - PHYSICIAN COMMUNICATION CRITICAL VALUE ALERT - BLOOD CULTURE IDENTIFICATION (BCID)  Selena Bush is an 78 y.o. female who presented to [facility] on 01/15/2024 with a chief complaint of stercoral colitis and severe constipation.  Assessment:  Bcx anaerobic bottle with staph epi and a different anaerobic bottle with staph hominis. Likely contaminants.   Name of physician (or Provider) Contacted: Dr. Efrain Grant  Current antibiotics: none  Changes to prescribed antibiotics recommended:  No changes recommended   Results for orders placed or performed during the hospital encounter of 01/15/24  Blood Culture ID Panel (Reflexed) (Collected: 01/17/2024 12:19 AM)  Result Value Ref Range   Enterococcus faecalis NOT DETECTED NOT DETECTED   Enterococcus Faecium NOT DETECTED NOT DETECTED   Listeria monocytogenes NOT DETECTED NOT DETECTED   Staphylococcus species DETECTED (A) NOT DETECTED   Staphylococcus aureus (BCID) NOT DETECTED NOT DETECTED   Staphylococcus epidermidis DETECTED (A) NOT DETECTED   Staphylococcus lugdunensis NOT DETECTED NOT DETECTED   Streptococcus species NOT DETECTED NOT DETECTED   Streptococcus agalactiae NOT DETECTED NOT DETECTED   Streptococcus pneumoniae NOT DETECTED NOT DETECTED   Streptococcus pyogenes NOT DETECTED NOT DETECTED   A.calcoaceticus-baumannii NOT DETECTED NOT DETECTED   Bacteroides fragilis NOT DETECTED NOT DETECTED   Enterobacterales NOT DETECTED NOT DETECTED   Enterobacter cloacae complex NOT DETECTED NOT DETECTED   Escherichia coli NOT DETECTED NOT DETECTED   Klebsiella aerogenes NOT DETECTED NOT DETECTED   Klebsiella oxytoca NOT DETECTED NOT DETECTED   Klebsiella pneumoniae NOT DETECTED NOT DETECTED   Proteus species NOT DETECTED NOT DETECTED   Salmonella species NOT DETECTED NOT DETECTED   Serratia marcescens NOT DETECTED NOT DETECTED   Haemophilus influenzae NOT DETECTED NOT DETECTED   Neisseria meningitidis NOT DETECTED NOT DETECTED    Pseudomonas aeruginosa NOT DETECTED NOT DETECTED   Stenotrophomonas maltophilia NOT DETECTED NOT DETECTED   Candida albicans NOT DETECTED NOT DETECTED   Candida auris NOT DETECTED NOT DETECTED   Candida glabrata NOT DETECTED NOT DETECTED   Candida krusei NOT DETECTED NOT DETECTED   Candida parapsilosis NOT DETECTED NOT DETECTED   Candida tropicalis NOT DETECTED NOT DETECTED   Cryptococcus neoformans/gattii NOT DETECTED NOT DETECTED   Methicillin resistance mecA/C DETECTED (A) NOT DETECTED   Dorene Gang, PharmD, BCPS, BCCP Clinical Pharmacist  Please check AMION for all [facility] Pharmacy phone numbers After 10:00 PM, call Main Pharmacy [phone]

## 2024-01-19 NOTE — TOC Progression Note (Signed)
 Transition of Care [facility]) - Progression Note    Patient Details  Name: Selena Bush MRN: [id number] Date of Birth: [birth year]  [facility] of [facility] CM/SW Contact  Arron Big, [place] Phone Number: 01/19/2024, 2:43 PM  Clinical Narrative:   Per Palliative, the family has decided for patient to be comfort care.   TOC will continue to follow.           Expected Discharge Plan and Services                                               Social Determinants of Health (SDOH) Interventions SDOH Screenings   Food Insecurity: No Food Insecurity (01/06/2022)  Transportation Needs: No Transportation Needs (01/06/2022)  Depression (PHQ2-9): Low Risk  (04/11/2022)  Financial Resource Strain: Low Risk  (01/06/2022)  Physical Activity: Insufficiently Active (01/06/2022)  Social Connections: Moderately Integrated (10/02/2017)  Stress: No Stress Concern Present (01/06/2022)  Tobacco Use: Low Risk  (01/15/2024)    Readmission Risk Interventions     No data to display

## 2024-01-20 DIAGNOSIS — K5289 Other specified noninfective gastroenteritis and colitis: Secondary | ICD-10-CM | POA: Diagnosis not present

## 2024-01-20 MED ORDER — HYDROMORPHONE HCL 1 MG/ML IJ SOLN
0.5000 mg | INTRAMUSCULAR | Status: DC | PRN
  Administered 2024-01-21 (×2): 1 mg via INTRAVENOUS
  Filled 2024-01-20 (×2): qty 1

## 2024-01-20 MED ORDER — MORPHINE SULFATE (PF) 2 MG/ML IV SOLN
2.0000 mg | INTRAVENOUS | Status: DC | PRN
  Administered 2024-01-20 (×2): 2 mg via INTRAVENOUS
  Filled 2024-01-20 (×2): qty 1

## 2024-01-20 NOTE — Plan of Care (Signed)
  Problem: Role Relationship: Goal: Family's ability to cope with current situation will improve Outcome: Progressing   Problem: Clinical Measurements: Goal: Quality of life will improve Outcome: Progressing   Problem: Coping: Goal: Level of anxiety will decrease Outcome: Progressing   Problem: Role Relationship: Goal: Ability to verbalize concerns, feelings, and thoughts to partner or family member will improve Outcome: Progressing

## 2024-01-20 NOTE — Progress Notes (Signed)
 Daily Progress Note   Patient Name: Selena Bush       Date: 01/20/2024 DOB: [birth year]  Age: 78 y.o. MRN#: [id number] Attending Physician: Jobe Mulder,* Primary Care Physician: [facility] Admit Date: 01/15/2024  Reason for Consultation/Follow-up: Establishing goals of care   Length of Stay: 4  Current Medications: Scheduled Meds:   antiseptic oral rinse  15 mL Topical BID   Chlorhexidine  Gluconate Cloth  6 each Topical Daily   Gerhardt's butt cream   Topical BID    Continuous Infusions:   PRN Meds: acetaminophen  **OR** acetaminophen , artificial tears, bisacodyl , diphenhydrAMINE , glycopyrrolate  **OR** glycopyrrolate  **OR** glycopyrrolate , haloperidol  **OR** haloperidol  **OR** haloperidol  lactate, HYDROmorphone  (DILAUDID ) injection, LORazepam  **OR** LORazepam  **OR** LORazepam , ondansetron  **OR** ondansetron  (ZOFRAN ) IV  Physical Exam Constitutional:      General: She is not in acute distress.    Appearance: She is ill-appearing.     Interventions: Nasal cannula in place.     Comments: 3L  HENT:     Head: Normocephalic and atraumatic.  Pulmonary:     Effort: Pulmonary effort is normal.  Skin:    General: Skin is warm and dry.     Findings: Bruising present.  Neurological:     Mental Status: She is lethargic.             Vital Signs: BP 117/74   Pulse 78   Temp (!) 97.4 F (36.3 C) (Oral)   Resp (!) 23   Ht 5\' 1"  (1.549 m)   Wt 100.2 kg   SpO2 100%   BMI 41.76 kg/m  SpO2: SpO2: 100 % O2 Device: O2 Device: High Flow Nasal Cannula O2 Flow Rate: O2 Flow Rate (L/min): 6 L/min       Palliative Assessment/Data: 10%      Patient Active Problem List   Diagnosis Date Noted   Stercoral colitis 01/16/2024   Cerebral  vascular disease 10/10/2022   Gait abnormality 07/11/2022   Pressure injury of skin 06/05/2022   Rib fracture 06/04/2022   Stage 3a chronic kidney disease (CKD) (HCC) 06/04/2022   Skin ulcer with fat layer exposed (HCC) 03/16/2022   Vitamin B 12 deficiency 01/27/2022   Malaise 01/27/2022   Fever and chills 01/27/2022   Wheezing 01/27/2022   Vitamin D  deficiency 01/27/2022   Hallucinations  11/24/2021   Abdominal pain 11/12/2021   Falls frequently 03/24/2021   Difficulty walking 04/04/2020   Hypophonia 01/15/2020   Numbness 12/28/2019   Left arm weakness 12/09/2019   Tremor 08/26/2019   Parkinsonism (HCC) 07/29/2019   DDD (degenerative disc disease), lumbar 10/19/2018   Preventative health care 10/19/2018   Prediabetes 10/19/2018   (HFpEF) heart failure with preserved ejection fraction (HCC) 12/12/2017   Pericardial effusion with cardiac tamponade 11/13/2017   Paroxysmal atrial fibrillation (HCC) 11/05/2017   Acute cough 05/30/2017   Anxiety 03/21/2016   Insomnia 07/17/2015   MI (mitral incompetence) 12/10/2014   Major depressive disorder 12/09/2014   OSA (obstructive sleep apnea) 12/09/2014   GERD (gastroesophageal reflux disease) 12/09/2014   Chronic diastolic CHF (congestive heart failure) (HCC)    HLD (hyperlipidemia)    Osteoarthritis of both knees    S/P left TKA 08/19/2014   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 (BMI) 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 (HCC) 04/17/2013   History of ductal carcinoma in situ (DCIS) of breast 11/05/2012   Essential hypertension, benign 11/05/2012    Palliative Care Assessment & Plan   Patient Profile: 78 y.o. female  with past medical history of Parkinson's disease, history of CVA (2024), paroxysmal A-fib on Eliquis , hypertension, hyperlipidemia, type 2 diabetes, and OSA on CPAP admitted on 01/15/2024 with decreased urine output despite increase dose of her diuretics by her cardiologist 2 days ago. Patient also reported constipation for  more than 8 days.    UA was negative for pyuria. Had acute urinary retention and Foley catheter was placed. CT abdomen and pelvis revealed diffusely stool-filled colon consistent with constipation, stool-filled rectum with rectal wall thickening suggesting stercoral colitis, and bilateral perihilar infiltrates suggesting pneumonia or edema, cardiac enlargement.   Today's Discussion: Reviewed chart and received update from patient's nurse. Patient required as needed robinul  and dilaudid  overnight. Patient lying in bed. She is on 3L oxygen  and her breathing looks comfortable. Family is at bedside including daughter and son. The patient's daughter reports patient's brow is furrowed which we had discussed yesterday could be a sign of pain. Requested as needed pain medication from nursing staff who is at bedside.  Encouraged family to report any other signs symptoms of discomfort or distress. Family shared the patient was able to have a few hours visiting extended family yesterday. They are glad she at this time without the constraints of BiPAP or other mask. Patient's daughter told the patient today that it is okay if she wants to leave them and go to heaven.  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  Family's primary concern is that the patient have comfort and dignity during her dying time.  Family would like patient to have end-of-life care in the hospital.  Emotional support and therapeutic listening provided.  PMT will continue to support.  Recommendations/Plan: DNR- Comfort measures Comfort orders per [facility]  PMT will continue to follow   Code Status:    Code Status Orders  (From admission, onward)           Start     Ordered   01/19/24 1021  Do not attempt resuscitation (DNR) - Comfort care  Continuous       Question Answer Comment  If patient has no pulse and is not breathing Do Not Attempt Resuscitation   In Pre-Arrest Conditions (Patient Is Breathing and Has a Pulse) Provide  comfort measures. Relieve any mechanical airway obstruction. Avoid transfer unless required for comfort.   Consent:  Discussion documented in EHR or advanced directives reviewed      01/19/24 1023        Extensive chart review has been completed prior to seeing the patient including labs, vital signs, imaging, progress/consult notes, orders, medications, and available advance directive documents.  Care plan was discussed with bedside RN  Time spent: 35 minutes  Thank you for allowing the Palliative Medicine Team to assist in the care of this patient.    Daina Drum, NP  Please contact Palliative Medicine Team phone at [phone] for questions and concerns.

## 2024-01-20 NOTE — Progress Notes (Signed)
 PROGRESS NOTE    Selena Bush  NWG:[id number] DOB: 07-13-1946 DOA: 01/15/2024 PCP: [facility]     Brief Narrative:  Selena Bush is a 78 y.o. female with medical history significant for Parkinson's disease, history of CVA (2024), paroxysmal A-fib on Eliquis , hypertension, hyperlipidemia, type 2 diabetes, OSA on CPAP, presented to the hospital with decreased urine output despite increasing dose of her diuretics by her cardiologist 2 days ago.  Patient also reported constipation for more than 8 days.  In the ED, urinalysis was negative for pyuria but had significant urinary retention and Foley catheter was placed.  CT scan of the abdomen and pelvis showed diffusely filled colon consistent with constipation.  There was some evidence of stercoral colitis.  Bilateral perihilar infiltrates were also noted.  patient was somnolent with abnormal VBG with pH of 7.2 and PCO2 of 88.  Labs were notable for sodium of 132 with creatinine of 1.1.  Patient was put on BiPAP for CO2 narcosis.  Patient was then admitted hospital for further evaluation and treatment. She continued to be lethargic and failed to improve. She has thus been transitioned to comfort care.      New events last 24 hours / Subjective: Pt surrounded by family/friends. No reports of discomfort overnight. She is sleeping in bed.   Assessment & Plan:   Principal Problem:   Stercoral colitis Active Problems:   History of ductal carcinoma in situ (DCIS) of breast   Essential hypertension, benign   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 (BMI) 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 (HCC)   Chronic diastolic CHF (congestive heart failure) (HCC)   HLD (hyperlipidemia)  Stercoral colitis Severe constipation. Constipation for 8 days prior to presentation..  Received enema in the hospital and had several bowel movements.     Acute on chronic hypoxic and hypercarbic respiratory failure Patient uses CPAP  nightly for sleep apnea.  Initially required BiPAP for CO2 narcosis.  Unclear whether patient is compliant with BiPAP at home or not.  Patient was previously put on BiPAP in the hospital but despite that appears to be extremely lethargic.  At this time patient has been seen by palliative care as well.  Plan is comfort care.  Plan is not to proceed with any aggressive treatment.   Fever.  Possibly secondary to pneumonia.  Question aspiration.   Was initially on vancomycin  cefepime  and metronidazole .   Had some perihilar infiltrates on the CT scan of the abdomen and pelvis.  Antibiotics have been discontinued for comfort care   Hypokalemia.  Replenished during hospitalization.  Latest potassium of 3.3.   Acute metabolic encephalopathy secondary to CO2 narcosis Patient still very somnolent and lethargic.  Initial VBG pH 7.26 with pCO2 88.9.  ABG also showed hypercarbia initially.  Initial interventions included BiPAP.  CT head without any acute findings.  At this time plan is to proceed with comfort care.   Paroxysmal A-fib on Eliquis  with RVR Was on Lovenox .  Sotalol  was ordered but unable to take p.o. was briefly put on IV metoprolol  and IV Cardizem .  At this time transition to comfort care.   Parkinson's disease Failed swallow evaluation.  Seen by speech therapy.  Unable to give Sinemet  orally.   Hypotension Received albumin  initially.  Likely secondary to autonomic dysfunction from Parkinson's disease.    Sinus bradycardia On presentation.  Subsequently improved    Acute on chronic chronic HFpEF with grade 2 diastolic dysfunction BNP greater than  400 on presentation.  Chest x-ray shows some vascular congestion.  Blood pressure low on admission.  Currently has been transitioned to to comfort care.     Acute urinary retention Foley catheter placed on 01/16/2024 continue to monitor urinary output.  Likely secondary to constipation.  Now on comfort care.   Generalized weakness Transitioning to  comfort care.   Hyperlipidemia DC Zetia    Chronic anxiety and depression Transition to comfort care   Polyneuropathy DC gabapentin    QTc prolongation   Deconditioning, debility, lethargic, deteriorating overall clinical status.   Comfort care.   DVT prophylaxis: DC'd Code Status: DNR/comfort Family Communication: Many family members bedside Coming From: Home Disposition Plan: Comfort care Barriers to Discharge: Pt is comfort care  Consultants:  Palliative care  Procedures:  None  Antimicrobials:  Anti-infectives (From admission, onward)    Start     Dose/Rate Route Frequency Ordered Stop   01/19/24 0100  vancomycin  (VANCOREADY) IVPB 750 mg/150 mL  Status:  Discontinued        750 mg 150 mL/hr over 60 Minutes Intravenous Every 36 hours 01/17/24 1343 01/19/24 1023   01/17/24 1430  metroNIDAZOLE  (FLAGYL ) IVPB 500 mg  Status:  Discontinued        500 mg 100 mL/hr over 60 Minutes Intravenous Every 12 hours 01/17/24 1336 01/19/24 1023   01/17/24 1430  vancomycin  (VANCOCIN ) IVPB 1000 mg/200 mL premix        1,000 mg 200 mL/hr over 60 Minutes Intravenous  Once 01/17/24 1336 01/18/24 0723   01/17/24 1430  ceFEPIme  (MAXIPIME ) 2 g in sodium chloride  0.9 % 100 mL IVPB  Status:  Discontinued        2 g 200 mL/hr over 30 Minutes Intravenous Every 12 hours 01/17/24 1339 01/19/24 1023   01/16/24 2300  doxycycline  (VIBRAMYCIN ) 100 mg in sodium chloride  0.9 % 250 mL IVPB  Status:  Discontinued        100 mg 125 mL/hr over 120 Minutes Intravenous Every 12 hours 01/16/24 2202 01/17/24 1336   01/16/24 2245  levofloxacin  (LEVAQUIN ) IVPB 750 mg  Status:  Discontinued        750 mg 100 mL/hr over 90 Minutes Intravenous Every 24 hours 01/16/24 2156 01/16/24 2158   01/16/24 2245  levofloxacin  (LEVAQUIN ) IVPB 750 mg  Status:  Discontinued        750 mg 100 mL/hr over 90 Minutes Intravenous Every 48 hours 01/16/24 2158 01/16/24 2202        Objective: Vitals:   01/19/24 1000 01/19/24  1200 01/19/24 1303 01/19/24 1400  BP: 133/72 122/77 127/69 117/74  Pulse: 71 77 79 78  Resp: 18 (!) 24 (!) 22 (!) 23  Temp:   (!) 97.4 F (36.3 C)   TempSrc:   Oral   SpO2: 99% 100% 100% 100%  Weight:      Height:        Intake/Output Summary (Last 24 hours) at 01/20/2024 1126 Last data filed at 01/20/2024 0500 Gross per 24 hour  Intake 0 ml  Output 175 ml  Net -175 ml   Filed Weights   01/17/24 0150 01/18/24 0441 01/19/24 0400  Weight: 65.6 kg 67.5 kg 100.2 kg    Examination:  General exam: Appears calm and comfortable  Respiratory system: No respiratory distress.  Psychiatry: Sleeping, unable to ascertain  Data Reviewed: I have personally reviewed following labs and imaging studies  CBC: Recent Labs  Lab 01/15/24 2224 01/16/24 0148 01/16/24 3244 01/16/24 0102 01/16/24 7253 01/17/24 0225  01/17/24 0520 01/18/24 0226 01/19/24 0230  WBC 8.9  --  9.0  --   --  7.7  --  11.4* 11.8*  HGB 13.1   < > 10.9*   < > 10.5* 9.3* 8.5* 9.3* 7.8*  HCT 39.2   < > 35.5*   < > 31.0* 28.9* 25.0* 28.4* 24.3*  MCV 92.7  --  98.6  --   --  95.4  --  94.7 96.8  PLT 161  --  192  --   --  190  --  171 163   < > = values in this interval not displayed.   Basic Metabolic Panel: Recent Labs  Lab 01/15/24 2224 01/16/24 0148 01/16/24 0220 01/16/24 0981 01/16/24 1914 01/17/24 0225 01/17/24 0520 01/18/24 0226 01/19/24 0230  NA 132*   < > 135   < > 135 134* 134* 136 141  K 4.2   < > 3.8   < > 3.8 3.4* 3.3* 3.5 3.3*  CL 90*  --  91*  --   --  93*  --  100 104  CO2 28  --  36*  --   --  30  --  26 30  GLUCOSE 103*  --  110*  --   --  109*  --  128* 124*  BUN 14  --  14  --   --  15  --  12 9  CREATININE 1.13*  --  1.12*  --   --  1.20*  --  0.93 0.68  CALCIUM 9.6  --  9.8  --   --  9.2  --  8.9 8.6*  MG  --   --  2.0  --   --  1.8  --  1.9 1.9  PHOS  --   --  4.4  --   --   --   --   --   --    < > = values in this interval not displayed.   GFR: Estimated Creatinine Clearance: 66  mL/min (by C-G formula based on SCr of 0.68 mg/dL). Liver Function Tests: Recent Labs  Lab 01/15/24 2224 01/19/24 0230  AST 21 44*  ALT QUANTITY NOT SUFFICIENT, UNABLE TO PERFORM TEST 17  ALKPHOS 47 29*  BILITOT 0.8 0.9  PROT QUANTITY NOT SUFFICIENT, UNABLE TO PERFORM TEST 4.9*  ALBUMIN  3.4* 2.2*   Recent Labs  Lab 01/15/24 2224  LIPASE 30   Sepsis Labs: Recent Labs  Lab 01/17/24 0225  PROCALCITON <0.10    Recent Results (from the past 240 hours)  Culture, blood (Routine X 2) w Reflex to ID Panel     Status: Abnormal   Collection Time: 01/17/24 12:17 AM   Specimen: BLOOD LEFT HAND  Result Value Ref Range Status   Specimen Description BLOOD LEFT HAND  Final   Special Requests   Final    BOTTLES DRAWN AEROBIC AND ANAEROBIC Blood Culture results may not be optimal due to an inadequate volume of blood received in culture bottles   Culture  Setup Time   Final    GRAM POSITIVE COCCI ANAEROBIC BOTTLE ONLY CRITICAL RESULT CALLED TO, READ BACK BY AND VERIFIED WITH: PHARMD K. HURTH [id number] @ 1910 FH    Culture (A)  Final    STAPHYLOCOCCUS HOMINIS THE SIGNIFICANCE OF ISOLATING THIS ORGANISM FROM A SINGLE SET OF BLOOD CULTURES WHEN MULTIPLE SETS ARE DRAWN IS UNCERTAIN. PLEASE NOTIFY THE MICROBIOLOGY DEPARTMENT WITHIN ONE WEEK IF SPECIATION AND SENSITIVITIES ARE REQUIRED.  Performed at [facility], [address] [place], [place] [address]    Report Status 01/19/2024 FINAL  Final  Culture, blood (Routine X 2) w Reflex to ID Panel     Status: Abnormal (Preliminary result)   Collection Time: 01/17/24 12:19 AM   Specimen: BLOOD LEFT HAND  Result Value Ref Range Status   Specimen Description BLOOD LEFT HAND  Final   Special Requests   Final    BOTTLES DRAWN AEROBIC AND ANAEROBIC Blood Culture adequate volume   Culture  Setup Time   Final    GRAM POSITIVE COCCI ANAEROBIC BOTTLE ONLY CRITICAL RESULT CALLED TO, READ BACK BY AND VERIFIED WITH: PHARMD K. HURTH [id number] @ 1910  FH GRAM POSITIVE RODS BOTTLES DRAWN AEROBIC ONLY CRITICAL RESULT CALLED TO, READ BACK BY AND VERIFIED WITH: PHARMD L. CHEN [id number] @ 1734 FH     Culture (A)  Final    STAPHYLOCOCCUS EPIDERMIDIS THE SIGNIFICANCE OF ISOLATING THIS ORGANISM FROM A SINGLE SET OF BLOOD CULTURES WHEN MULTIPLE SETS ARE DRAWN IS UNCERTAIN. PLEASE NOTIFY THE MICROBIOLOGY DEPARTMENT WITHIN ONE WEEK IF SPECIATION AND SENSITIVITIES ARE REQUIRED. Performed at [facility], [address] [place], [place] [address]    Report Status PENDING  Incomplete  Blood Culture ID Panel (Reflexed)     Status: Abnormal   Collection Time: 01/17/24 12:19 AM  Result Value Ref Range Status   Enterococcus faecalis NOT DETECTED NOT DETECTED Final   Enterococcus Faecium NOT DETECTED NOT DETECTED Final   Listeria monocytogenes NOT DETECTED NOT DETECTED Final   Staphylococcus species DETECTED (A) NOT DETECTED Final    Comment: CRITICAL RESULT CALLED TO, READ BACK BY AND VERIFIED WITH: PHARMD K. HURTH [id number] @ 1910 FH    Staphylococcus aureus (BCID) NOT DETECTED NOT DETECTED Final   Staphylococcus epidermidis DETECTED (A) NOT DETECTED Final    Comment: Methicillin (oxacillin) resistant coagulase negative staphylococcus. Possible blood culture contaminant (unless isolated from more than one blood culture draw or clinical case suggests pathogenicity). No antibiotic treatment is indicated for blood  culture contaminants. CRITICAL RESULT CALLED TO, READ BACK BY AND VERIFIED WITH: PHARMD K. HURTH [id number] @ 1910 FH    Staphylococcus lugdunensis NOT DETECTED NOT DETECTED Final   Streptococcus species NOT DETECTED NOT DETECTED Final   Streptococcus agalactiae NOT DETECTED NOT DETECTED Final   Streptococcus pneumoniae NOT DETECTED NOT DETECTED Final   Streptococcus pyogenes NOT DETECTED NOT DETECTED Final   A.calcoaceticus-baumannii NOT DETECTED NOT DETECTED Final   Bacteroides fragilis NOT DETECTED NOT DETECTED Final   Enterobacterales  NOT DETECTED NOT DETECTED Final   Enterobacter cloacae complex NOT DETECTED NOT DETECTED Final   Escherichia coli NOT DETECTED NOT DETECTED Final   Klebsiella aerogenes NOT DETECTED NOT DETECTED Final   Klebsiella oxytoca NOT DETECTED NOT DETECTED Final   Klebsiella pneumoniae NOT DETECTED NOT DETECTED Final   Proteus species NOT DETECTED NOT DETECTED Final   Salmonella species NOT DETECTED NOT DETECTED Final   Serratia marcescens NOT DETECTED NOT DETECTED Final   Haemophilus influenzae NOT DETECTED NOT DETECTED Final   Neisseria meningitidis NOT DETECTED NOT DETECTED Final   Pseudomonas aeruginosa NOT DETECTED NOT DETECTED Final   Stenotrophomonas maltophilia NOT DETECTED NOT DETECTED Final   Candida albicans NOT DETECTED NOT DETECTED Final   Candida auris NOT DETECTED NOT DETECTED Final   Candida glabrata NOT DETECTED NOT DETECTED Final   Candida krusei NOT DETECTED NOT DETECTED Final   Candida parapsilosis NOT DETECTED NOT DETECTED Final  Candida tropicalis NOT DETECTED NOT DETECTED Final   Cryptococcus neoformans/gattii NOT DETECTED NOT DETECTED Final   Methicillin resistance mecA/C DETECTED (A) NOT DETECTED Final    Comment: CRITICAL RESULT CALLED TO, READ BACK BY AND VERIFIED WITH: PHARMD K. Catharine Clock [id number] @ 1910 FH Performed at [facility], [address], [place], [place] [address]      Scheduled Meds:  antiseptic oral rinse  15 mL Topical BID   Chlorhexidine  Gluconate Cloth  6 each Topical Daily   Gerhardt's butt cream   Topical BID   Continuous Infusions:   LOS: 4 days    Time spent: 20 minutes   Jobe Mulder, DO [facility] 01/20/2024, 11:26 AM   Available via Epic secure chat 7am-7pm After these hours, please refer to coverage provider listed on [email or web address]

## 2024-01-21 DIAGNOSIS — Z515 Encounter for palliative care: Secondary | ICD-10-CM | POA: Diagnosis not present

## 2024-01-21 DIAGNOSIS — J9620 Acute and chronic respiratory failure, unspecified whether with hypoxia or hypercapnia: Secondary | ICD-10-CM | POA: Diagnosis present

## 2024-01-21 DIAGNOSIS — Z7189 Other specified counseling: Secondary | ICD-10-CM | POA: Diagnosis not present

## 2024-01-21 DIAGNOSIS — K5289 Other specified noninfective gastroenteritis and colitis: Secondary | ICD-10-CM | POA: Diagnosis not present

## 2024-01-21 MED ORDER — HYDROMORPHONE BOLUS VIA INFUSION: 0.2500 mg | INTRAVENOUS | Status: DC | PRN

## 2024-01-21 MED ORDER — GLYCOPYRROLATE 0.2 MG/ML IJ SOLN
0.2000 mg | INTRAMUSCULAR | Status: DC | PRN
  Administered 2024-01-21: 0.2 mg via INTRAVENOUS

## 2024-01-21 MED ORDER — GLYCOPYRROLATE 0.2 MG/ML IJ SOLN
0.2000 mg | INTRAMUSCULAR | Status: DC | PRN
  Filled 2024-01-21: qty 1

## 2024-01-21 MED ORDER — STERILE WATER FOR INJECTION IJ SOLN
INTRAMUSCULAR | Status: AC
  Filled 2024-01-21: qty 10

## 2024-01-21 MED ORDER — HYDROMORPHONE HCL-NACL 50-0.9 MG/50ML-% IV SOLN
1.0000 mg/h | INTRAVENOUS | Status: DC
  Administered 2024-01-21: 0.5 mg/h via INTRAVENOUS
  Filled 2024-01-21: qty 50

## 2024-01-21 MED ORDER — GLYCOPYRROLATE 1 MG PO TABS: 1.0000 mg | ORAL_TABLET | ORAL | Status: DC | PRN

## 2024-01-22 LAB — CULTURE, BLOOD (ROUTINE X 2): Special Requests: ADEQUATE

## 2024-01-26 ENCOUNTER — Ambulatory Visit: Payer: Medicare (Managed Care) | Admitting: Podiatry

## 2024-01-30 ENCOUNTER — Ambulatory Visit: Payer: Medicare (Managed Care) | Admitting: Cardiology

## 2024-02-13 NOTE — Death Summary Note (Signed)
 DEATH SUMMARY   Patient Details  Name: Selena Bush MRN: [id number] DOB: 03-27-1946  Admission/Discharge Information   Admit Date:  02-02-2024  Date of Death:    Time of Death:    Length of Stay: 5  Referring Physician: [facility]   Reason(s) for Hospitalization  Stercoral colitis Acute on chronic hypoxic and hypercarbic respiratory failure  Diagnoses  Preliminary cause of death:  Secondary Diagnoses (including complications and co-morbidities):  Principal Problem:   Stercoral colitis Active Problems:   Acute on chronic respiratory failure (HCC)   History of ductal carcinoma in situ (DCIS) of breast   Essential hypertension, benign   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 year old female who came to the ER on 02-02-2024 for urinary retention and severe constipation.  She was found to be in acidosis as she is very somnolent and carbon oxide levels 88.  She was placed on BiPAP and antibiotics.  She received albumin  for lower blood pressure.  Throughout her hospital course, she did not significantly improve.  Palliative care met with the family and comfort care measures were taken on 6/6.  She was made comfortable and eventually her oxygen  levels desaturated leading to her death at 1350 on 02/08/24.  Pertinent Labs and Studies  Significant Diagnostic Studies ECHOCARDIOGRAM LIMITED Result Date: 01/18/2024    ECHOCARDIOGRAM LIMITED REPORT   Patient Name:   Selena Bush Date of Exam: 01/18/2024 Medical Rec #:  [id number]       Height:       61.0 in Accession #:    [id number]      Weight:       148.8 lb Date of Birth:  August 25, 1945        BSA:          1.666 m Patient Age:    78 years        BP:           125/63 mmHg Patient Gender: F               HR:           60 bpm. Exam Location:  Inpatient Procedure: Limited Echo, Cardiac Doppler and Color Doppler (Both Spectral and            Color Flow Doppler were utilized during procedure). Indications:    Aortic Stenosis  History:        Patient has prior history of Echocardiogram examinations, most                 recent 01/17/2024. CHF; Risk Factors:Hypertension, Dyslipidemia                 and Sleep Apnea.  Sonographer:    Meagan Baucom RDCS, FE, PE Referring Phys: [id number] DANIEL R BENSIMHON  Sonographer Comments: Limited Echo to assess aortic valve IMPRESSIONS  1. Left ventricular ejection fraction, by estimation, is 65 to 70%. The left ventricle has normal function. The left ventricle has no regional wall motion abnormalities.  2. Right ventricular systolic function is normal. The right ventricular size is normal.  3. The mitral valve is degenerative. Moderate mitral annular calcification.  4.  Restricted motion of non coronary cusp of aortic valve. The aortic valve is calcified. There is moderate calcification of the aortic valve. There is moderate thickening of the aortic valve. Mild aortic valve stenosis. Aortic valve mean gradient measures 11.0 mmHg. Aortic valve Vmax measures 2.33 m/s. FINDINGS  Left Ventricle: Left ventricular ejection fraction, by estimation, is 65 to 70%. The left ventricle has normal function. The left ventricle has no regional wall motion abnormalities. Right Ventricle: The right ventricular size is normal. Right ventricular systolic function is normal. Mitral Valve: The mitral valve is degenerative in appearance. Moderate mitral annular calcification. Aortic Valve: Restricted motion of non coronary cusp of aortic valve. The aortic valve is calcified. There is moderate calcification of the aortic valve. There is moderate thickening of the aortic valve. Mild aortic stenosis is  present. Aortic valve mean  gradient measures 11.0 mmHg. Aortic valve peak gradient measures 21.7 mmHg. Aortic valve area, by VTI measures 2.39 cm. LEFT VENTRICLE PLAX 2D LVOT diam:     2.00 cm LV SV:         103 LV SV Index:   62 LVOT Area:     3.14 cm  AORTIC VALVE AV Area (Vmax):    2.01 cm AV Area (Vmean):   2.22 cm AV Area (VTI):     2.39 cm AV Vmax:           233.00 cm/s AV Vmean:          137.000 cm/s AV VTI:            0.433 m AV Peak Grad:      21.7 mmHg AV Mean Grad:      11.0 mmHg LVOT Vmax:         149.00 cm/s LVOT Vmean:        96.850 cm/s LVOT VTI:          0.329 m LVOT/AV VTI ratio: 0.76  AORTA Ao Root diam: 3.40 cm  SHUNTS Systemic VTI:  0.33 m Systemic Diam: 2.00 cm Dorothye Gathers MD Electronically signed by Dorothye Gathers MD Signature Date/Time: 01/18/2024/11:31:44 AM    Final    ECHOCARDIOGRAM COMPLETE Result Date: 01/17/2024    ECHOCARDIOGRAM REPORT   Patient Name:   Selena Bush Date of Exam: 01/17/2024 Medical Rec #:  [id number]       Height:       61.0 in Accession #:    [id number]      Weight:       144.6 lb Date of Birth:  27-Nov-1945       BSA:          1.646 m Patient Age:    78 years        BP:           115/70 mmHg Patient Gender: F               HR:           122 bpm. Exam Location:  Inpatient Procedure: 2D Echo, Color Doppler and Cardiac Doppler (Both Spectral and Color            Flow Doppler were utilized during procedure). Indications:    CHF Acute Diastolic I50.31  History:        Patient has prior history of Echocardiogram examinations, most                 recent 03/21/2023.  Sonographer:    Placido Brightly RDCS Referring Phys: [id number] Charlotta Cook  HALL IMPRESSIONS  1. Left ventricular ejection fraction, by estimation, is 70 to 75%. The left ventricle has hyperdynamic function. The left ventricle has no regional wall motion abnormalities. There is moderate concentric left ventricular hypertrophy. Left ventricular diastolic parameters are indeterminate.  2. Right ventricular systolic  function is normal. The right ventricular size is moderately enlarged.  3. Left atrial size was severely dilated.  4. The mitral valve is normal in structure. Trivial mitral valve regurgitation. No evidence of mitral stenosis. Severe mitral annular calcification.  5. Tricuspid valve regurgitation is moderate.  6. The aortic valve is tricuspid. There is moderate calcification of the aortic valve. Aortic valve regurgitation is not visualized. Visually expect at least moderate AS but aortic valve not well interrogated on this study. Will get repeat limited echo to further evalaute. FINDINGS  Left Ventricle: Left ventricular ejection fraction, by estimation, is 70 to 75%. The left ventricle has hyperdynamic function. The left ventricle has no regional wall motion abnormalities. The left ventricular internal cavity size was normal in size. There is moderate concentric left ventricular hypertrophy. Left ventricular diastolic parameters are indeterminate. Right Ventricle: The right ventricular size is moderately enlarged. No increase in right ventricular wall thickness. Right ventricular systolic function is normal. Left Atrium: Left atrial size was severely dilated. Right Atrium: Right atrial size was normal in size. Pericardium: There is no evidence of pericardial effusion. Mitral Valve: The mitral valve is normal in structure. Severe mitral annular calcification. Trivial mitral valve regurgitation. No evidence of mitral valve stenosis. Tricuspid Valve: The tricuspid valve is normal in structure. Tricuspid valve regurgitation is moderate . No evidence of tricuspid stenosis. Aortic Valve: The aortic valve is tricuspid. There is moderate calcification of the aortic valve. Aortic valve regurgitation is not visualized. Visually expect at least moderate AS but aortic valve not well interrogated on this study. Will get repeat limited echo to further evalaute. Pulmonic Valve: The pulmonic valve was normal in structure. Pulmonic  valve regurgitation is trivial. No evidence of pulmonic stenosis. Aorta: The aortic root is normal in size and structure. Venous: The inferior vena cava was not well visualized. IAS/Shunts: No atrial level shunt detected by color flow Doppler.  LEFT VENTRICLE PLAX 2D LVIDd:         3.10 cm LVIDs:         2.40 cm LV PW:         1.50 cm LV IVS:        1.80 cm LVOT diam:     2.00 cm LVOT Area:     3.14 cm  RIGHT VENTRICLE TAPSE (M-mode): 1.2 cm LEFT ATRIUM         Index LA diam:    4.70 cm 2.86 cm/m   AORTA Ao Root diam: 3.40 cm Ao Asc diam:  3.40 cm  SHUNTS Systemic Diam: 2.00 cm Jules Oar MD Electronically signed by Jules Oar MD Signature Date/Time: 01/17/2024/5:49:38 PM    Final    CT Head Wo Contrast Result Date: 01/16/2024 EXAM: CT HEAD WITHOUT 01/16/2024 03:08:52 AM TECHNIQUE: CT of the head was performed without the administration of intravenous contrast. Automated exposure control, iterative reconstruction, and/or weight based adjustment of the mA/kV was utilized to reduce the radiation dose to as low as reasonably achievable. COMPARISON: 07/28/2023 CLINICAL HISTORY: Mental status change, unknown cause. FINDINGS: BRAIN AND VENTRICLES: There is no acute intracranial hemorrhage, mass effect or midline shift. No abnormal extra-axial fluid collection. The gray-white differentiation is maintained without an acute infarct. There is no  hydrocephalus. Global cortical atrophy. Subcortical and periventricular small vessel ischemic changes. ORBITS: The visualized portion of the orbits demonstrate no acute abnormality. SINUSES: The visualized paranasal sinuses and mastoid air cells demonstrate no acute abnormality. SOFT TISSUES AND SKULL: No acute abnormality of the visualized skull or soft tissues. VASCULATURE: Intracranial atherosclerosis. IMPRESSION: 1. No acute intracranial abnormality. 2. Global cortical atrophy and small vessel ischemic changes. Electronically signed by: Zadie Herter MD 01/16/2024  03:14 AM EDT RP Workstation: [id number]   CT ABDOMEN PELVIS WO CONTRAST Result Date: 01/16/2024 CLINICAL DATA:  Acute nonlocalized abdominal pain. EXAM: CT ABDOMEN AND PELVIS WITHOUT CONTRAST TECHNIQUE: Multidetector CT imaging of the abdomen and pelvis was performed following the standard protocol without IV contrast. RADIATION DOSE REDUCTION: This exam was performed according to the departmental dose-optimization program which includes automated exposure control, adjustment of the mA and/or kV according to patient size and/or use of iterative reconstruction technique. COMPARISON:  Ultrasound kidneys 01/04/2024 FINDINGS: Lower chest: Motion artifact limits examination. Bilateral perihilar infiltrates likely representing pneumonia or edema. Mild cardiac enlargement. Hepatobiliary: No focal liver abnormality is seen. Status post cholecystectomy. No biliary dilatation. Pancreas: Unremarkable. No pancreatic ductal dilatation or surrounding inflammatory changes. Spleen: Normal in size without focal abnormality. Adrenals/Urinary Tract: Adrenal glands are unremarkable. Kidneys are normal, without renal calculi, focal lesion, or hydronephrosis. Bladder is decompressed with a Foley catheter. Stomach/Bowel: Stomach, small bowel, and colon are not abnormally distended. The colon is diffusely stool-filled. Stool-filled rectum with rectal wall thickening may indicate stercoral colitis. Appendix is normal. Vascular/Lymphatic: Aortic atherosclerosis. No enlarged abdominal or pelvic lymph nodes. Reproductive: Status post hysterectomy. No adnexal masses. Other: No free air or free fluid in the abdomen. Diastasis or laxity of the anterior abdominal wall musculature without true herniation. Musculoskeletal: Degenerative changes in the spine. No acute bony abnormalities. IMPRESSION: 1. Diffusely stool-filled colon consistent with constipation. Stool-filled rectum with rectal wall thickening suggesting stercoral colitis. 2. Aortic  atherosclerosis. 3. Bilateral perihilar infiltrates suggesting pneumonia or edema. Cardiac enlargement. Electronically Signed   By: Boyce Byes M.D.   On: 01/16/2024 00:27   DG Chest 2 View Result Date: 01/15/2024 CLINICAL DATA:  SOB CHF EXAM: CHEST - 2 VIEW COMPARISON:  11/07/2023. FINDINGS: Elevated right hemidiaphragm. Pulmonary vascular congestion. No pneumothorax. Enlarged cardiac silhouette. Thoracic degenerative changes. Calcified aorta. Thoracic degenerative disc disease. IMPRESSION: Enlarged cardiac silhouette with vascular congestion. Electronically Signed   By: Sydell Eva M.D.   On: 01/15/2024 22:12    Microbiology Recent Results (from the past 240 hours)  Culture, blood (Routine X 2) w Reflex to ID Panel     Status: Abnormal   Collection Time: 01/17/24 12:17 AM   Specimen: BLOOD LEFT HAND  Result Value Ref Range Status   Specimen Description BLOOD LEFT HAND  Final   Special Requests   Final    BOTTLES DRAWN AEROBIC AND ANAEROBIC Blood Culture results may not be optimal due to an inadequate volume of blood received in culture bottles   Culture  Setup Time   Final    GRAM POSITIVE COCCI ANAEROBIC BOTTLE ONLY CRITICAL RESULT CALLED TO, READ BACK BY AND VERIFIED WITH: PHARMD K. HURTH [id number] @ 1910 FH    Culture (A)  Final    STAPHYLOCOCCUS HOMINIS THE SIGNIFICANCE OF ISOLATING THIS ORGANISM FROM A SINGLE SET OF BLOOD CULTURES WHEN MULTIPLE SETS ARE DRAWN IS UNCERTAIN. PLEASE NOTIFY THE MICROBIOLOGY DEPARTMENT WITHIN ONE WEEK IF SPECIATION AND SENSITIVITIES ARE REQUIRED. Performed at [facility], [address], [place],  [place] [address]    Report Status 01/19/2024 FINAL  Final  Culture, blood (Routine X 2) w Reflex to ID Panel     Status: Abnormal (Preliminary result)   Collection Time: 01/17/24 12:19 AM   Specimen: BLOOD LEFT HAND  Result Value Ref Range Status   Specimen Description BLOOD LEFT HAND  Final   Special Requests   Final    BOTTLES DRAWN AEROBIC  AND ANAEROBIC Blood Culture adequate volume   Culture  Setup Time   Final    GRAM POSITIVE COCCI ANAEROBIC BOTTLE ONLY CRITICAL RESULT CALLED TO, READ BACK BY AND VERIFIED WITH: PHARMD K. HURTH [id number] @ 1910 FH GRAM POSITIVE RODS BOTTLES DRAWN AEROBIC ONLY CRITICAL RESULT CALLED TO, READ BACK BY AND VERIFIED WITH: PHARMD L. CHEN [id number] @ 1734 FH     Culture (A)  Final    STAPHYLOCOCCUS EPIDERMIDIS THE SIGNIFICANCE OF ISOLATING THIS ORGANISM FROM A SINGLE SET OF BLOOD CULTURES WHEN MULTIPLE SETS ARE DRAWN IS UNCERTAIN. PLEASE NOTIFY THE MICROBIOLOGY DEPARTMENT WITHIN ONE WEEK IF SPECIATION AND SENSITIVITIES ARE REQUIRED. Performed at [facility], [address], [place], [place] [address]    Report Status PENDING  Incomplete  Blood Culture ID Panel (Reflexed)     Status: Abnormal   Collection Time: 01/17/24 12:19 AM  Result Value Ref Range Status   Enterococcus faecalis NOT DETECTED NOT DETECTED Final   Enterococcus Faecium NOT DETECTED NOT DETECTED Final   Listeria monocytogenes NOT DETECTED NOT DETECTED Final   Staphylococcus species DETECTED (A) NOT DETECTED Final    Comment: CRITICAL RESULT CALLED TO, READ BACK BY AND VERIFIED WITH: PHARMD K. HURTH [id number] @ 1910 FH    Staphylococcus aureus (BCID) NOT DETECTED NOT DETECTED Final   Staphylococcus epidermidis DETECTED (A) NOT DETECTED Final    Comment: Methicillin (oxacillin) resistant coagulase negative staphylococcus. Possible blood culture contaminant (unless isolated from more than one blood culture draw or clinical case suggests pathogenicity). No antibiotic treatment is indicated for blood  culture contaminants. CRITICAL RESULT CALLED TO, READ BACK BY AND VERIFIED WITH: PHARMD K. HURTH [id number] @ 1910 FH    Staphylococcus lugdunensis NOT DETECTED NOT DETECTED Final   Streptococcus species NOT DETECTED NOT DETECTED Final   Streptococcus agalactiae NOT DETECTED NOT DETECTED Final   Streptococcus pneumoniae NOT DETECTED NOT  DETECTED Final   Streptococcus pyogenes NOT DETECTED NOT DETECTED Final   A.calcoaceticus-baumannii NOT DETECTED NOT DETECTED Final   Bacteroides fragilis NOT DETECTED NOT DETECTED Final   Enterobacterales NOT DETECTED NOT DETECTED Final   Enterobacter cloacae complex NOT DETECTED NOT DETECTED Final   Escherichia coli NOT DETECTED NOT DETECTED Final   Klebsiella aerogenes NOT DETECTED NOT DETECTED Final   Klebsiella oxytoca NOT DETECTED NOT DETECTED Final   Klebsiella pneumoniae NOT DETECTED NOT DETECTED Final   Proteus species NOT DETECTED NOT DETECTED Final   Salmonella species NOT DETECTED NOT DETECTED Final   Serratia marcescens NOT DETECTED NOT DETECTED Final   Haemophilus influenzae NOT DETECTED NOT DETECTED Final   Neisseria meningitidis NOT DETECTED NOT DETECTED Final   Pseudomonas aeruginosa NOT DETECTED NOT DETECTED Final   Stenotrophomonas maltophilia NOT DETECTED NOT DETECTED Final   Candida albicans NOT DETECTED NOT DETECTED Final   Candida auris NOT DETECTED NOT DETECTED Final   Candida glabrata NOT DETECTED NOT DETECTED Final   Candida krusei NOT DETECTED NOT DETECTED Final   Candida parapsilosis NOT DETECTED NOT DETECTED Final   Candida tropicalis NOT DETECTED NOT DETECTED Final   Cryptococcus  neoformans/gattii NOT DETECTED NOT DETECTED Final   Methicillin resistance mecA/C DETECTED (A) NOT DETECTED Final    Comment: CRITICAL RESULT CALLED TO, READ BACK BY AND VERIFIED WITH: PHARMD K. Catharine Clock [id number] @ 1910 FH Performed at [facility], [address], [place], [place] [address]     Lab Basic Metabolic Panel: Recent Labs  Lab 01/15/24 2224 01/16/24 0148 01/16/24 0220 01/16/24 8413 01/16/24 2440 01/17/24 0225 01/17/24 0520 01/18/24 0226 01/19/24 0230  NA 132*   < > 135   < > 135 134* 134* 136 141  K 4.2   < > 3.8   < > 3.8 3.4* 3.3* 3.5 3.3*  CL 90*  --  91*  --   --  93*  --  100 104  CO2 28  --  36*  --   --  30  --  26 30  GLUCOSE 103*  --  110*   --   --  109*  --  128* 124*  BUN 14  --  14  --   --  15  --  12 9  CREATININE 1.13*  --  1.12*  --   --  1.20*  --  0.93 0.68  CALCIUM 9.6  --  9.8  --   --  9.2  --  8.9 8.6*  MG  --   --  2.0  --   --  1.8  --  1.9 1.9  PHOS  --   --  4.4  --   --   --   --   --   --    < > = values in this interval not displayed.   Liver Function Tests: Recent Labs  Lab 01/15/24 2224 01/19/24 0230  AST 21 44*  ALT QUANTITY NOT SUFFICIENT, UNABLE TO PERFORM TEST 17  ALKPHOS 47 29*  BILITOT 0.8 0.9  PROT QUANTITY NOT SUFFICIENT, UNABLE TO PERFORM TEST 4.9*  ALBUMIN  3.4* 2.2*   Recent Labs  Lab 01/15/24 2224  LIPASE 30   CBC: Recent Labs  Lab 01/15/24 2224 01/16/24 0148 01/16/24 0610 01/16/24 1027 01/16/24 0834 01/17/24 0225 01/17/24 0520 01/18/24 0226 01/19/24 0230  WBC 8.9  --  9.0  --   --  7.7  --  11.4* 11.8*  HGB 13.1   < > 10.9*   < > 10.5* 9.3* 8.5* 9.3* 7.8*  HCT 39.2   < > 35.5*   < > 31.0* 28.9* 25.0* 28.4* 24.3*  MCV 92.7  --  98.6  --   --  95.4  --  94.7 96.8  PLT 161  --  192  --   --  190  --  171 163   < > = values in this interval not displayed.   Sepsis Labs: Recent Labs  Lab 01/16/24 0610 01/17/24 0225 01/18/24 0226 01/19/24 0230  PROCALCITON  --  <0.10  --   --   WBC 9.0 7.7 11.4* 11.8*    Procedures/Operations  None   Shellie Dials [facility] 02/05/2024, 2:03 PM

## 2024-02-13 NOTE — Progress Notes (Signed)
 MD notified. Postmortem care performed. Hydromorphone  drip wasted with Athena Bland RN.   Feb 07, 2024 1400  Attending Physican Contact  Attending Physician Notified Y  Attending Physician (First and Last Name) Dawna Etienne  Post Mortem Checklist  Date of Death 02-07-2024  Time of Death 1350  Pronounced By Bettyjane Brunet, Gabe RN  Next of kin notified Yes  Name of next of kin notified of death Jacqulin Maus  Contact Person's Relationship to Patient Daughter  Contact Person's Phone Number [phone]  Contact Person's address [address] [place]   Family Communication Notes At bedside  Was the patient a No Code Blue or a Limited Code Blue? No  Did the patient die unattended? No  Patient restrained (physical/manual hold/chemical)? *See row information* Not applicable  Body preparation complete Y  [facility] (previously known as [facility])  Notification Date 07-Feb-2024  Notification Time 1427  [facility] Number [phone]  Is patient a potential donor? N  Autopsy  Autopsy requested by MD or Family ( Non ME Case) N/A  Patient and Hospital Property Returned  Patient is satisfied that all belongings have been returned? Yes  Name of person receiving valuables? Heather Porter [place]  Specify valuables returned Pillow, patients bags  Home Medications/TOC prescriptions returned from Not Applicable  Dermatherapy linen/gowns NOT sent with patient or transporter Not applicable  Dead on Arrival (Emergency Department)  Patient dead on arrival? No  Notifications  Patient Placement notified that Post Mortem checklist is complete Yes  Patient Placement notified body transferred Transported to morgue  TO BE FILLED OUT BY PATIENT PLACEMENT ONLY  FH - Funeral Home Notified Y  FH - Name of person who notified funeral home tiffany  FH - Date funeral home notified 02-07-24  FH - Time funeral home notified 58  FH - Name of person at funeral home with whom you spoke angie  Medical Examiner  Is  this a medical examiner's case? [facility] home name/address/phone # [facility] - [address] [place] [phone]  Planned location of pickup [place]

## 2024-02-13 NOTE — Plan of Care (Signed)
 ?  Problem: Clinical Measurements: ?Goal: Will remain free from infection ?Outcome: Progressing ?  ?

## 2024-02-13 NOTE — Progress Notes (Addendum)
 Daily Progress Note   Patient Name: Selena Bush       Date: 02/03/2024 DOB: 29-Oct-1945  Age: 78 y.o. MRN#: [id number] Attending Physician: Jobe Mulder,* Primary Care Physician: [facility] Admit Date: 01/15/2024  Reason for Consultation/Follow-up: Establishing goals of care   Length of Stay: 5  Current Medications: Scheduled Meds:   antiseptic oral rinse  15 mL Topical BID   Chlorhexidine  Gluconate Cloth  6 each Topical Daily   Gerhardt's butt cream   Topical BID   sterile water (preservative free)        Continuous Infusions:  HYDROmorphone       PRN Meds: acetaminophen  **OR** acetaminophen , artificial tears, bisacodyl , diphenhydrAMINE , glycopyrrolate  **OR** glycopyrrolate  **OR** glycopyrrolate , haloperidol  **OR** haloperidol  **OR** haloperidol  lactate, HYDROmorphone , LORazepam  **OR** LORazepam  **OR** LORazepam , ondansetron  **OR** ondansetron  (ZOFRAN ) IV, sterile water (preservative free)  Physical Exam Constitutional:      General: She is not in acute distress.    Appearance: She is ill-appearing.     Interventions: Nasal cannula in place.     Comments: 3L  HENT:     Head: Normocephalic and atraumatic.  Pulmonary:     Effort: Pulmonary effort is normal.  Skin:    General: Skin is warm and dry.     Findings: Bruising present.  Neurological:     Mental Status: She is lethargic.             Vital Signs: BP 95/62 (BP Location: Right Arm)   Pulse 88   Temp (!) 102 F (38.9 C) (Axillary)   Resp (!) 22   Ht 5\' 1"  (1.549 m)   Wt 100.2 kg   SpO2 (!) 50%   BMI 41.76 kg/m  SpO2: SpO2: (!) 50 % O2 Device: O2 Device: High Flow Nasal Cannula O2 Flow Rate: O2 Flow Rate (L/min): 3 L/min       Palliative Assessment/Data:  10%      Patient Active Problem List   Diagnosis Date Noted   Stercoral colitis 01/16/2024   Cerebral vascular disease 10/10/2022   Gait abnormality 07/11/2022   Pressure injury of skin 06/05/2022   Rib fracture 06/04/2022   Stage 3a chronic kidney disease (CKD) (HCC) 06/04/2022   Skin ulcer with fat layer exposed (HCC) 03/16/2022   Vitamin B 12 deficiency 01/27/2022  Malaise 01/27/2022   Fever and chills 01/27/2022   Wheezing 01/27/2022   Vitamin D  deficiency 01/27/2022   Hallucinations 11/24/2021   Abdominal pain 11/12/2021   Falls frequently 03/24/2021   Difficulty walking 04/04/2020   Hypophonia 01/15/2020   Numbness 12/28/2019   Left arm weakness 12/09/2019   Tremor 08/26/2019   Parkinsonism (HCC) 07/29/2019   DDD (degenerative disc disease), lumbar 10/19/2018   Preventative health care 10/19/2018   Prediabetes 10/19/2018   (HFpEF) heart failure with preserved ejection fraction (HCC) 12/12/2017   Pericardial effusion with cardiac tamponade 11/13/2017   Paroxysmal atrial fibrillation (HCC) 11/05/2017   Acute cough 05/30/2017   Anxiety 03/21/2016   Insomnia 07/17/2015   MI (mitral incompetence) 12/10/2014   Major depressive disorder 12/09/2014   OSA (obstructive sleep apnea) 12/09/2014   GERD (gastroesophageal reflux disease) 12/09/2014   Chronic diastolic CHF (congestive heart failure) (HCC)    HLD (hyperlipidemia)    Osteoarthritis of both knees    S/P left TKA 08/19/2014   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 (BMI) 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 (HCC) 04/17/2013   History of ductal carcinoma in situ (DCIS) of breast 11/05/2012   Essential hypertension, benign 11/05/2012    Palliative Care Assessment & Plan   Patient Profile: 78 y.o. female  with past medical history of Parkinson's disease, history of CVA (2024), paroxysmal A-fib on Eliquis , hypertension, hyperlipidemia, type 2 diabetes, and OSA on CPAP admitted on 01/15/2024 with decreased urine  output despite increase dose of her diuretics by her cardiologist 2 days ago. Patient also reported constipation for more than 8 days.    UA was negative for pyuria. Had acute urinary retention and Foley catheter was placed. CT abdomen and pelvis revealed diffusely stool-filled colon consistent with constipation, stool-filled rectum with rectal wall thickening suggesting stercoral colitis, and bilateral perihilar infiltrates suggesting pneumonia or edema, cardiac enlargement.   Today's Discussion: Reviewed chart and received update from patient's nurse. Over the last 24 hours patient required four doses of prn opioids and one dose of robinul .  Patient lying in bed she appears comfortable. She remains on 3L oxygen . Discussed removing the oxygen  with family. They are in agreement that they do not want to prolong her end of life with supplemental oxygen . We remove the oxygen  and provide a dose of dilaudid . We discuss adding a continuous dilaudid  infusion and family is agreeable. Family is at bedside including daughter and her best friend. Family's primary concern is that the patient have comfort and dignity during her dying time.  Family would like patient to have end-of-life care in the hospital. Family share they are ready for her suffering to end and for her to go to heaven. Family share family stories and stories about the patient. Emotional support and therapeutic listening provided.  PMT will continue to support.  Recommendations/Plan: DNR- Comfort measures Comfort orders per [facility]- add dilaudid  continuous infusion with available bolus doses PMT will continue to follow   Code Status:    Code Status Orders  (From admission, onward)           Start     Ordered   01/19/24 1021  Do not attempt resuscitation (DNR) - Comfort care  Continuous       Question Answer Comment  If patient has no pulse and is not breathing Do Not Attempt Resuscitation   In Pre-Arrest Conditions (Patient Is Breathing  and Has a Pulse) Provide comfort measures. Relieve any mechanical airway obstruction. Avoid transfer unless required for  comfort.   Consent: Discussion documented in EHR or advanced directives reviewed      01/19/24 1023        Extensive chart review has been completed prior to seeing the patient including labs, vital signs, imaging, progress/consult notes, orders, medications, and available advance directive documents.  Care plan was discussed with bedside RN  Time spent: 35 minutes  Thank you for allowing the Palliative Medicine Team to assist in the care of this patient.    Daina Drum, NP  Please contact Palliative Medicine Team phone at [phone] for questions and concerns.

## 2024-02-13 DEATH — deceased

## 2024-05-01 ENCOUNTER — Ambulatory Visit: Payer: Medicare (Managed Care) | Admitting: Neurology
